# Patient Record
Sex: Female | Born: 1945
Health system: Southern US, Community
[De-identification: ages and names within clinical notes are randomized; demographics above are authoritative.]

## PROBLEM LIST (undated history)

## (undated) DIAGNOSIS — E079 Disorder of thyroid, unspecified: Secondary | ICD-10-CM

## (undated) DIAGNOSIS — F0390 Unspecified dementia without behavioral disturbance: Secondary | ICD-10-CM

## (undated) DIAGNOSIS — C9000 Multiple myeloma not having achieved remission: Secondary | ICD-10-CM

## (undated) DIAGNOSIS — E119 Type 2 diabetes mellitus without complications: Secondary | ICD-10-CM

## (undated) DIAGNOSIS — C8589 Other specified types of non-Hodgkin lymphoma, extranodal and solid organ sites: Secondary | ICD-10-CM

## (undated) DIAGNOSIS — R4189 Other symptoms and signs involving cognitive functions and awareness: Secondary | ICD-10-CM

## (undated) DIAGNOSIS — E039 Hypothyroidism, unspecified: Secondary | ICD-10-CM

## (undated) DIAGNOSIS — E876 Hypokalemia: Secondary | ICD-10-CM

## (undated) DIAGNOSIS — Z905 Acquired absence of kidney: Secondary | ICD-10-CM

## (undated) DIAGNOSIS — I10 Essential (primary) hypertension: Secondary | ICD-10-CM

## (undated) DIAGNOSIS — H269 Unspecified cataract: Secondary | ICD-10-CM

## (undated) DIAGNOSIS — C8339 Primary central nervous system lymphoma: Secondary | ICD-10-CM

## (undated) HISTORY — PX: CRANIOTOMY: SHX93

## (undated) HISTORY — PX: ABDOMINAL HYSTERECTOMY: SHX81

---

## 1998-07-15 ENCOUNTER — Emergency Department (HOSPITAL_COMMUNITY): Admission: EM | Admit: 1998-07-15 | Discharge: 1998-07-15 | Payer: Self-pay | Admitting: Emergency Medicine

## 2011-01-08 ENCOUNTER — Emergency Department (HOSPITAL_COMMUNITY)
Admission: EM | Admit: 2011-01-08 | Discharge: 2011-01-08 | Payer: Self-pay | Source: Home / Self Care | Admitting: Emergency Medicine

## 2011-01-08 LAB — BASIC METABOLIC PANEL
BUN: 16 mg/dL (ref 6–23)
Chloride: 102 mEq/L (ref 96–112)
Creatinine, Ser: 0.98 mg/dL (ref 0.4–1.2)

## 2011-01-08 LAB — CBC
MCH: 31.5 pg (ref 26.0–34.0)
MCV: 88.2 fL (ref 78.0–100.0)
Platelets: 165 10*3/uL (ref 150–400)
RDW: 15.8 % — ABNORMAL HIGH (ref 11.5–15.5)

## 2011-01-08 LAB — DIFFERENTIAL
Eosinophils Absolute: 0.3 10*3/uL (ref 0.0–0.7)
Eosinophils Relative: 4 % (ref 0–5)
Lymphs Abs: 0.8 10*3/uL (ref 0.7–4.0)
Monocytes Relative: 8 % (ref 3–12)

## 2011-01-19 ENCOUNTER — Emergency Department (HOSPITAL_COMMUNITY)
Admission: EM | Admit: 2011-01-19 | Discharge: 2011-01-19 | Disposition: A | Discharge status: Home / Self Care | Payer: 59 | Attending: Emergency Medicine | Admitting: Emergency Medicine

## 2011-01-19 ENCOUNTER — Emergency Department (HOSPITAL_COMMUNITY): Payer: 59

## 2011-01-19 DIAGNOSIS — D649 Anemia, unspecified: Secondary | ICD-10-CM | POA: Insufficient documentation

## 2011-01-19 DIAGNOSIS — R509 Fever, unspecified: Secondary | ICD-10-CM | POA: Insufficient documentation

## 2011-01-19 DIAGNOSIS — Z9889 Other specified postprocedural states: Secondary | ICD-10-CM | POA: Insufficient documentation

## 2011-01-19 DIAGNOSIS — R10819 Abdominal tenderness, unspecified site: Secondary | ICD-10-CM | POA: Insufficient documentation

## 2011-01-19 DIAGNOSIS — R109 Unspecified abdominal pain: Secondary | ICD-10-CM | POA: Insufficient documentation

## 2011-01-19 DIAGNOSIS — N390 Urinary tract infection, site not specified: Secondary | ICD-10-CM | POA: Insufficient documentation

## 2011-01-19 DIAGNOSIS — I10 Essential (primary) hypertension: Secondary | ICD-10-CM | POA: Insufficient documentation

## 2011-01-19 DIAGNOSIS — Z79899 Other long term (current) drug therapy: Secondary | ICD-10-CM | POA: Insufficient documentation

## 2011-01-19 LAB — URINALYSIS, ROUTINE W REFLEX MICROSCOPIC
Leukocytes, UA: NEGATIVE
Nitrite: POSITIVE — AB
Specific Gravity, Urine: 1.015 (ref 1.005–1.030)
Urine Glucose, Fasting: NEGATIVE mg/dL
pH: 7 (ref 5.0–8.0)

## 2011-01-19 LAB — BASIC METABOLIC PANEL
Chloride: 103 mEq/L (ref 96–112)
GFR calc Af Amer: >60 mL/min (ref >60)
Potassium: 3 mEq/L — ABNORMAL LOW (ref 3.5–5.1)
Sodium: 136 mEq/L (ref 135–145)

## 2011-01-19 LAB — CBC
MCV: 87.9 fL (ref 78.0–100.0)
Platelets: 186 10*3/uL (ref 150–400)
RBC: 2.89 MIL/uL — ABNORMAL LOW (ref 3.87–5.11)
WBC: 7.5 10*3/uL (ref 4.0–10.5)

## 2011-01-19 LAB — DIFFERENTIAL
Basophils Absolute: 0 10*3/uL (ref 0.0–0.1)
Basophils Relative: 0 % (ref 0–1)
Eosinophils Absolute: 0.3 10*3/uL (ref 0.0–0.7)
Lymphs Abs: 0.9 10*3/uL (ref 0.7–4.0)
Neutrophils Relative %: 73 % (ref 43–77)

## 2011-01-19 LAB — URINE MICROSCOPIC-ADD ON

## 2011-01-21 LAB — URINE CULTURE

## 2011-01-25 LAB — CULTURE, BLOOD (ROUTINE X 2)

## 2015-07-02 ENCOUNTER — Ambulatory Visit (HOSPITAL_COMMUNITY)
Admission: RE | Admit: 2015-07-02 | Discharge: 2015-07-02 | Disposition: A | Discharge status: Home / Self Care | Payer: Medicare PPO | Source: Ambulatory Visit | Attending: Ophthalmology | Admitting: Ophthalmology

## 2015-07-02 ENCOUNTER — Encounter (HOSPITAL_COMMUNITY)
Admission: RE | Disposition: A | Discharge status: Home / Self Care | Payer: Self-pay | Source: Ambulatory Visit | Attending: Ophthalmology

## 2015-07-02 DIAGNOSIS — I10 Essential (primary) hypertension: Secondary | ICD-10-CM | POA: Insufficient documentation

## 2015-07-02 DIAGNOSIS — E119 Type 2 diabetes mellitus without complications: Secondary | ICD-10-CM | POA: Diagnosis not present

## 2015-07-02 DIAGNOSIS — H2640 Unspecified secondary cataract: Secondary | ICD-10-CM | POA: Diagnosis not present

## 2015-07-02 DIAGNOSIS — H52203 Unspecified astigmatism, bilateral: Secondary | ICD-10-CM | POA: Diagnosis not present

## 2015-07-02 DIAGNOSIS — H5203 Hypermetropia, bilateral: Secondary | ICD-10-CM | POA: Diagnosis not present

## 2015-07-02 DIAGNOSIS — H524 Presbyopia: Secondary | ICD-10-CM | POA: Diagnosis not present

## 2015-07-02 HISTORY — PX: YAG LASER APPLICATION: SHX6189

## 2015-07-02 SURGERY — TREATMENT, USING YAG LASER
Anesthesia: LOCAL | Laterality: Right

## 2015-07-02 MED ORDER — TETRACAINE HCL 0.5 % OP SOLN
1.0000 [drp] | OPHTHALMIC | Status: AC
  Administered 2015-07-02 (×3): 1 [drp] via OPHTHALMIC

## 2015-07-02 MED ORDER — TROPICAMIDE 1 % OP SOLN
OPHTHALMIC | Status: AC
  Filled 2015-07-02: qty 3

## 2015-07-02 MED ORDER — TROPICAMIDE 1 % OP SOLN
1.0000 [drp] | OPHTHALMIC | Status: AC
  Administered 2015-07-02 (×2): 1 [drp] via OPHTHALMIC

## 2015-07-02 MED ORDER — TETRACAINE HCL 0.5 % OP SOLN
OPHTHALMIC | Status: AC
  Filled 2015-07-02: qty 2

## 2015-07-02 MED ORDER — TROPICAMIDE 1 % OP SOLN: 1.0000 [drp] | OPHTHALMIC | Status: AC

## 2015-07-02 NOTE — Discharge Instructions (Signed)
Amanda Wu  07/02/2015     Instructions    Activity: No Restrictions.   Diet: Resume Diet you were on at home.   Pain Medication: Tylenol if Needed.   CONTACT YOUR DOCTOR IF YOU HAVE PAIN, REDNESS IN YOUR EYE, OR DECREASED VISION.   Follow-up:in A FEW  weeks with [name], MD.   Dr. Gershon Crane: [phone]  Dr. Iona Hansen: [phone]  Dr. Geoffry Paradise: [phone]   If you find that you cannot contact your physician, but feel that your signs and   Symptoms warrant a physician's attention, call the Emergency Room at   [phone] [phone].   Othern/a FOLLOW UP 07/24/2015 @ 11:15 AM

## 2015-07-02 NOTE — H&P (Signed)
I have reviewed the pre printed H&P, the patient was re-examined, and I have identified no significant interval changes in the patient's medical condition.  There is no change in the plan of care since the history and physical of record. 

## 2015-07-02 NOTE — Brief Op Note (Signed)
  Amanda Wu 07/02/2015  [name], MD  Pre-op Diagnosis:  secondary cataract right eye  Post-op Diagnosis:   same  Yag laser self-test completed: Yes.    Indications:  See scanned office H&P for specific indications  Procedure: YAG posterior capsulotomy OD   Eye protection worn by staff:  Yes.   Laser In Use sign on door:  Yes.    Laser:   {LUMENIS YAG/SLT LASER selecta DUET  Power Setting:  1.7 mJ/burst Anatomical site treated:  Posterior capsule OD Number of applications:  51 Total energy delivered: 81.7 mJ Results:  Open visual axis  Patient was instructed to go to the office, as previously scheduled, for intraocular pressure:  No.  Patient verbalizes understanding of discharge instructions:  Yes.    Notes:    Pt tolerated procedure well.

## 2015-07-03 ENCOUNTER — Encounter (HOSPITAL_COMMUNITY): Payer: Self-pay | Admitting: Ophthalmology

## 2015-08-26 ENCOUNTER — Emergency Department (HOSPITAL_COMMUNITY)
Admission: EM | Admit: 2015-08-26 | Discharge: 2015-08-26 | Disposition: A | Discharge status: Home / Self Care | Payer: Medicare PPO | Attending: Emergency Medicine | Admitting: Emergency Medicine

## 2015-08-26 ENCOUNTER — Encounter (HOSPITAL_COMMUNITY): Payer: Self-pay | Admitting: Emergency Medicine

## 2015-08-26 ENCOUNTER — Emergency Department (HOSPITAL_COMMUNITY): Payer: Medicare PPO

## 2015-08-26 DIAGNOSIS — Z23 Encounter for immunization: Secondary | ICD-10-CM | POA: Diagnosis not present

## 2015-08-26 DIAGNOSIS — Z8579 Personal history of other malignant neoplasms of lymphoid, hematopoietic and related tissues: Secondary | ICD-10-CM | POA: Insufficient documentation

## 2015-08-26 DIAGNOSIS — E119 Type 2 diabetes mellitus without complications: Secondary | ICD-10-CM | POA: Diagnosis not present

## 2015-08-26 DIAGNOSIS — E039 Hypothyroidism, unspecified: Secondary | ICD-10-CM | POA: Insufficient documentation

## 2015-08-26 DIAGNOSIS — Z79899 Other long term (current) drug therapy: Secondary | ICD-10-CM | POA: Insufficient documentation

## 2015-08-26 DIAGNOSIS — E876 Hypokalemia: Secondary | ICD-10-CM | POA: Insufficient documentation

## 2015-08-26 DIAGNOSIS — I10 Essential (primary) hypertension: Secondary | ICD-10-CM | POA: Diagnosis not present

## 2015-08-26 DIAGNOSIS — S0181XA Laceration without foreign body of other part of head, initial encounter: Secondary | ICD-10-CM | POA: Insufficient documentation

## 2015-08-26 DIAGNOSIS — Y998 Other external cause status: Secondary | ICD-10-CM | POA: Insufficient documentation

## 2015-08-26 DIAGNOSIS — Z8669 Personal history of other diseases of the nervous system and sense organs: Secondary | ICD-10-CM | POA: Insufficient documentation

## 2015-08-26 DIAGNOSIS — Y9389 Activity, other specified: Secondary | ICD-10-CM | POA: Insufficient documentation

## 2015-08-26 DIAGNOSIS — R55 Syncope and collapse: Secondary | ICD-10-CM | POA: Diagnosis not present

## 2015-08-26 DIAGNOSIS — Y9289 Other specified places as the place of occurrence of the external cause: Secondary | ICD-10-CM | POA: Insufficient documentation

## 2015-08-26 DIAGNOSIS — W1839XA Other fall on same level, initial encounter: Secondary | ICD-10-CM | POA: Insufficient documentation

## 2015-08-26 HISTORY — DX: Essential (primary) hypertension: I10

## 2015-08-26 HISTORY — DX: Other symptoms and signs involving cognitive functions and awareness: R41.89

## 2015-08-26 HISTORY — DX: Multiple myeloma not having achieved remission: C90.00

## 2015-08-26 HISTORY — DX: Hypokalemia: E87.6

## 2015-08-26 HISTORY — DX: Unspecified cataract: H26.9

## 2015-08-26 HISTORY — DX: Hypothyroidism, unspecified: E03.9

## 2015-08-26 HISTORY — DX: Other specified types of non-hodgkin lymphoma, extranodal and solid organ sites: C85.89

## 2015-08-26 HISTORY — DX: Acquired absence of kidney: Z90.5

## 2015-08-26 HISTORY — DX: Disorder of thyroid, unspecified: E07.9

## 2015-08-26 HISTORY — DX: Primary central nervous system lymphoma: C83.390

## 2015-08-26 HISTORY — DX: Type 2 diabetes mellitus without complications: E11.9

## 2015-08-26 LAB — URINALYSIS, ROUTINE W REFLEX MICROSCOPIC
BILIRUBIN URINE: NEGATIVE
GLUCOSE, UA: NEGATIVE mg/dL
HGB URINE DIPSTICK: NEGATIVE
Ketones, ur: NEGATIVE mg/dL
Leukocytes, UA: NEGATIVE
Nitrite: NEGATIVE
PROTEIN: NEGATIVE mg/dL
Urobilinogen, UA: 0.2 mg/dL (ref 0.0–1.0)
pH: 6 (ref 5.0–8.0)

## 2015-08-26 LAB — CBC WITH DIFFERENTIAL/PLATELET
BASOS ABS: 0 10*3/uL (ref 0.0–0.1)
Basophils Relative: 1 % (ref 0–1)
Eosinophils Absolute: 0 10*3/uL (ref 0.0–0.7)
Eosinophils Relative: 1 % (ref 0–5)
HEMATOCRIT: 37.4 % (ref 36.0–46.0)
HEMOGLOBIN: 12.6 g/dL (ref 12.0–15.0)
LYMPHS ABS: 0.7 10*3/uL (ref 0.7–4.0)
LYMPHS PCT: 11 % — AB (ref 12–46)
MCH: 30 pg (ref 26.0–34.0)
MCHC: 33.7 g/dL (ref 30.0–36.0)
MCV: 89 fL (ref 78.0–100.0)
Monocytes Absolute: 0.4 10*3/uL (ref 0.1–1.0)
Monocytes Relative: 7 % (ref 3–12)
NEUTROS ABS: 5.1 10*3/uL (ref 1.7–7.7)
NEUTROS PCT: 81 % — AB (ref 43–77)
PLATELETS: 119 10*3/uL — AB (ref 150–400)
RBC: 4.2 MIL/uL (ref 3.87–5.11)
RDW: 17 % — ABNORMAL HIGH (ref 11.5–15.5)
WBC: 6.2 10*3/uL (ref 4.0–10.5)

## 2015-08-26 LAB — COMPREHENSIVE METABOLIC PANEL
ALK PHOS: 43 U/L (ref 38–126)
ALT: 24 U/L (ref 14–54)
AST: 34 U/L (ref 15–41)
Albumin: 3.7 g/dL (ref 3.5–5.0)
Anion gap: 9 (ref 5–15)
BUN: 13 mg/dL (ref 6–20)
CALCIUM: 9.1 mg/dL (ref 8.9–10.3)
CHLORIDE: 110 mmol/L (ref 101–111)
CO2: 26 mmol/L (ref 22–32)
CREATININE: 0.98 mg/dL (ref 0.44–1.00)
GFR, EST NON AFRICAN AMERICAN: 57 mL/min — AB (ref >60)
Glucose, Bld: 107 mg/dL — ABNORMAL HIGH (ref 65–99)
Potassium: 3.2 mmol/L — ABNORMAL LOW (ref 3.5–5.1)
Sodium: 145 mmol/L (ref 135–145)
Total Bilirubin: 0.5 mg/dL (ref 0.3–1.2)
Total Protein: 8.3 g/dL — ABNORMAL HIGH (ref 6.5–8.1)

## 2015-08-26 LAB — TROPONIN I
Troponin I: 0.04 ng/mL — ABNORMAL HIGH (ref <0.031)
Troponin I: 0.04 ng/mL — ABNORMAL HIGH (ref <0.031)

## 2015-08-26 MED ORDER — LIDOCAINE HCL (PF) 2 % IJ SOLN
10.0000 mL | Freq: Once | INTRAMUSCULAR | Status: AC
  Administered 2015-08-26: 10 mL
  Filled 2015-08-26: qty 10

## 2015-08-26 MED ORDER — TETANUS-DIPHTH-ACELL PERTUSSIS 5-2.5-18.5 LF-MCG/0.5 IM SUSP
0.5000 mL | Freq: Once | INTRAMUSCULAR | Status: AC
  Administered 2015-08-26: 0.5 mL via INTRAMUSCULAR
  Filled 2015-08-26: qty 0.5

## 2015-08-26 MED ORDER — HYDROCODONE-ACETAMINOPHEN 5-325 MG PO TABS: 2.0000 | ORAL_TABLET | ORAL | Status: DC | PRN

## 2015-08-26 MED ORDER — ACETAMINOPHEN 500 MG PO TABS
1000.0000 mg | ORAL_TABLET | Freq: Once | ORAL | Status: AC
  Administered 2015-08-26: 1000 mg via ORAL
  Filled 2015-08-26: qty 2

## 2015-08-26 MED ORDER — BACITRACIN ZINC 500 UNIT/GM EX OINT
TOPICAL_OINTMENT | CUTANEOUS | Status: AC
  Filled 2015-08-26: qty 0.9

## 2015-08-26 NOTE — ED Notes (Addendum)
Pt reports was dressing a wound for her sister and reports went to turn and reports fell forward. Pt reports loc for a few seconds per pt sister and EMS. Pt alert and oriented. Pt has forehead laceration. Wound dressed. Minimal bleeding noted to site.cbg en route 174.

## 2015-08-26 NOTE — ED Notes (Signed)
Pt ambulatory to the bathroom with assist.  Steady gait.

## 2015-08-26 NOTE — Discharge Instructions (Signed)
Facial Laceration ° A facial laceration is a cut on the face. These injuries can be painful and cause bleeding. Lacerations usually heal quickly, but they need special care to reduce scarring. °DIAGNOSIS  °Your health care provider will take a medical history, ask for details about how the injury occurred, and examine the wound to determine how deep the cut is. °TREATMENT  °Some facial lacerations may not require closure. Others may not be able to be closed because of an increased risk of infection. The risk of infection and the chance for successful closure will depend on various factors, including the amount of time since the injury occurred. °The wound may be cleaned to help prevent infection. If closure is appropriate, pain medicines may be given if needed. Your health care provider will use stitches (sutures), wound glue (adhesive), or skin adhesive strips to repair the laceration. These tools bring the skin edges together to allow for faster healing and a better cosmetic outcome. If needed, you may also be given a tetanus shot. °HOME CARE INSTRUCTIONS °· Only take over-the-counter or prescription medicines as directed by your health care provider. °· Follow your health care provider's instructions for wound care. These instructions will vary depending on the technique used for closing the wound. °For Sutures: °· Keep the wound clean and dry.   °· If you were given a bandage (dressing), you should change it at least once a day. Also change the dressing if it becomes wet or dirty, or as directed by your health care provider.   °· Wash the wound with soap and water 2 times a day. Rinse the wound off with water to remove all soap. Pat the wound dry with a clean towel.   °· After cleaning, apply a thin layer of the antibiotic ointment recommended by your health care provider. This will help prevent infection and keep the dressing from sticking.   °· You may shower as usual after the first 24 hours. Do not soak the  wound in water until the sutures are removed.   °· Get your sutures removed as directed by your health care provider. With facial lacerations, sutures should usually be taken out after 4-5 days to avoid stitch marks.   °· Wait a few days after your sutures are removed before applying any makeup. °For Skin Adhesive Strips: °· Keep the wound clean and dry.   °· Do not get the skin adhesive strips wet. You may bathe carefully, using caution to keep the wound dry.   °· If the wound gets wet, pat it dry with a clean towel.   °· Skin adhesive strips will fall off on their own. You may trim the strips as the wound heals. Do not remove skin adhesive strips that are still stuck to the wound. They will fall off in time.   °For Wound Adhesive: °· You may briefly wet your wound in the shower or bath. Do not soak or scrub the wound. Do not swim. Avoid periods of heavy sweating until the skin adhesive has fallen off on its own. After showering or bathing, gently pat the wound dry with a clean towel.   °· Do not apply liquid medicine, cream medicine, ointment medicine, or makeup to your wound while the skin adhesive is in place. This may loosen the film before your wound is healed.   °· If a dressing is placed over the wound, be careful not to apply tape directly over the skin adhesive. This may cause the adhesive to be pulled off before the wound is healed.   °· Avoid   prolonged exposure to sunlight or tanning lamps while the skin adhesive is in place.  The skin adhesive will usually remain in place for 5-10 days, then naturally fall off the skin. Do not pick at the adhesive film.  After Healing: Once the wound has healed, cover the wound with sunscreen during the day for 1 full year. This can help minimize scarring. Exposure to ultraviolet light in the first year will darken the scar. It can take 1-2 years for the scar to lose its redness and to heal completely.  SEEK IMMEDIATE MEDICAL CARE IF:  You have redness, pain, or  swelling around the wound.   You see ayellowish-white fluid (pus) coming from the wound.   You have chills or a fever.  MAKE SURE YOU:  Understand these instructions.  Will watch your condition.  Will get help right away if you are not doing well or get worse. Document Released: 01/08/2005 Document Revised: 09/21/2013 Document Reviewed: 07/14/2013 [facility] Patient Information 2015 [place], [place]. This information is not intended to replace advice given to you by your health care provider. Make sure you discuss any questions you have with your health care provider. Vasovagal Syncope, Adult Syncope, commonly known as fainting, is a temporary loss of consciousness. It occurs when the blood flow to the brain is reduced. Vasovagal syncope (also called neurocardiogenic syncope) is a fainting spell in which the blood flow to the brain is reduced because of a sudden drop in heart rate and blood pressure. Vasovagal syncope occurs when the brain and the cardiovascular system (blood vessels) do not adequately communicate and respond to each other. This is the most common cause of fainting. It often occurs in response to fear or some other type of emotional or physical stress. The body has a reaction in which the heart starts beating too slowly or the blood vessels expand, reducing blood pressure. This type of fainting spell is generally considered harmless. However, injuries can occur if a person takes a sudden fall during a fainting spell.  CAUSES  Vasovagal syncope occurs when a person's blood pressure and heart rate decrease suddenly, usually in response to a trigger. Many things and situations can trigger an episode. Some of these include:   Pain.   Fear.   The sight of blood or medical procedures, such as blood being drawn from a vein.   Common activities, such as coughing, swallowing, stretching, or going to the bathroom.   Emotional stress.   Prolonged standing, especially in a  warm environment.   Lack of sleep or rest.   Prolonged lack of food.   Prolonged lack of fluids.   Recent illness.  The use of certain drugs that affect blood pressure, such as cocaine, alcohol, marijuana, inhalants, and opiates.  SYMPTOMS  Before the fainting episode, you may:   Feel dizzy or light headed.   Become pale.  Sense that you are going to faint.   Feel like the room is spinning.   Have tunnel vision, only seeing directly in front of you.   Feel sick to your stomach (nauseous).   See spots or slowly lose vision.   Hear ringing in your ears.   Have a headache.   Feel warm and sweaty.   Feel a sensation of pins and needles. During the fainting spell, you will generally be unconscious for no longer than a couple minutes before waking up and returning to normal. If you get up too quickly before your body can recover, you may faint  again. Some twitching or jerky movements may occur during the fainting spell.  DIAGNOSIS  Your caregiver will ask about your symptoms, take a medical history, and perform a physical exam. Various tests may be done to rule out other causes of fainting. These may include blood tests and tests to check the heart, such as electrocardiography, echocardiography, and possibly an electrophysiology study. When other causes have been ruled out, a test may be done to check the body's response to changes in position (tilt table test). TREATMENT  Most cases of vasovagal syncope do not require treatment. Your caregiver may recommend ways to avoid fainting triggers and may provide home strategies for preventing fainting. If you must be exposed to a possible trigger, you can drink additional fluids to help reduce your chances of having an episode of vasovagal syncope. If you have warning signs of an oncoming episode, you can respond by positioning yourself favorably (lying down). If your fainting spells continue, you may be given medicines to  prevent fainting. Some medicines may help make you more resistant to repeated episodes of vasovagal syncope. Special exercises or compression stockings may be recommended. In rare cases, the surgical placement of a pacemaker is considered. HOME CARE INSTRUCTIONS   Learn to identify the warning signs of vasovagal syncope.   Sit or lie down at the first warning sign of a fainting spell. If sitting, put your head down between your legs. If you lie down, swing your legs up in the air to increase blood flow to the brain.   Avoid hot tubs and saunas.  Avoid prolonged standing.  Drink enough fluids to keep your urine clear or pale yellow. Avoid caffeine.  Increase salt in your diet as directed by your caregiver.   If you have to stand for a long time, perform movements such as:   Crossing your legs.   Flexing and stretching your leg muscles.   Squatting.   Moving your legs.   Bending over.   Only take over-the-counter or prescription medicines as directed by your caregiver. Do not suddenly stop any medicines without asking your caregiver first. SEEK MEDICAL CARE IF:   Your fainting spells continue or happen more frequently in spite of treatment.   You lose consciousness for more than a couple minutes.  You have fainting spells during or after exercising or after being startled.   You have new symptoms that occur with the fainting spells, such as:   Shortness of breath.  Chest pain.   Irregular heartbeat.   You have episodes of twitching or jerky movements that last longer than a few seconds.  You have episodes of twitching or jerky movements without obvious fainting. SEEK IMMEDIATE MEDICAL CARE IF:   You have injuries or bleeding after a fainting spell.   You have episodes of twitching or jerky movements that last longer than 5 minutes.   You have more than one spell of twitching or jerky movements before returning to consciousness after fainting. MAKE  SURE YOU:   Understand these instructions.  Will watch your condition.  Will get help right away if you are not doing well or get worse. Document Released: 11/17/2012 Document Reviewed: 11/17/2012 [facility] Patient Information 2015 [place]. This information is not intended to replace advice given to you by your health care provider. Make sure you discuss any questions you have with your health care provider.

## 2015-08-26 NOTE — ED Notes (Signed)
Patient requesting something for her headache. Amanda Wu made aware. New verbal orders given.

## 2015-08-26 NOTE — ED Provider Notes (Signed)
CSN: [id number]     Arrival date & time 08/26/15  7591 History   First MD Initiated Contact with Patient 08/26/15 0930     Chief Complaint  Patient presents with  . Loss of Consciousness     (Consider location/radiation/quality/duration/timing/severity/associated sxs/prior Treatment) Patient is a 69 y.o. female presenting with syncope. The history is provided by the patient. No language interpreter was used.  Loss of Consciousness Episode history:  Single Most recent episode:  Today Timing:  Constant Progression:  Worsening Chronicity:  New Context comment:  Wound evaluation Witnessed: yes   Worsened by:  Nothing tried Ineffective treatments:  None tried Associated symptoms: no chest pain, no focal weakness and no headaches   Pt reports she passed out after watching her sisters surgical dressing being changed.   Past Medical History  Diagnosis Date  . Multiple myeloma   . Benign hypertension   . Hypokalemia   . Central nervous system lymphoma   . H/O partial nephrectomy   . Impaired cognition   . Cataract   . Diabetes mellitus without complication   . Thyroid disease   . Hypothyroidism    Past Surgical History  Procedure Laterality Date  . Yag laser application Right 6/38/4665    Procedure: YAG LASER APPLICATION;  Surgeon: [name], MD;  Location: [facility];  Service: Ophthalmology;  Laterality: Right;  . Abdominal hysterectomy     History reviewed. No pertinent family history. Social History  Substance Use Topics  . Smoking status: Never Smoker   . Smokeless tobacco: None  . Alcohol Use: No   OB History    No data available     Review of Systems  Cardiovascular: Positive for syncope. Negative for chest pain.  Skin: Positive for wound.  Neurological: Negative for focal weakness and headaches.  All other systems reviewed and are negative.     Allergies  Lotensin  Home Medications   Prior to Admission medications   Medication Sig Start Date End  Date Taking? Authorizing Provider  acetaminophen (TYLENOL) 500 MG tablet Take 1,000 mg by mouth every 6 (six) hours as needed.   Yes Historical Provider, MD  amLODipine (NORVASC) 5 MG tablet Take 7.5 mg by mouth daily.   Yes Historical Provider, MD  dexamethasone (DECADRON) 4 MG tablet Take 4 mg by mouth. Take 5 tablets every Monday when you have chemo therapy.   Yes Historical Provider, MD  diphenhydramine-acetaminophen (TYLENOL PM) 25-500 MG TABS Take 1 tablet by mouth at bedtime as needed.   Yes Historical Provider, MD  ferrous sulfate 325 (65 FE) MG tablet Take 325 mg by mouth daily with breakfast.   Yes Historical Provider, MD  lenalidomide (REVLIMID) 15 MG capsule Take 15 mg by mouth daily. For two weeks then off for three weeks. Then follow up doctor   Yes Historical Provider, MD  levothyroxine (SYNTHROID, LEVOTHROID) 25 MCG tablet Take 25 mcg by mouth daily before breakfast.   Yes Historical Provider, MD  loperamide (IMODIUM) 2 MG capsule Take 2 mg by mouth as needed for diarrhea or loose stools.   Yes Historical Provider, MD  metFORMIN (GLUCOPHAGE) 500 MG tablet Take 250 mg by mouth every other day.   Yes Historical Provider, MD  Multiple Vitamins-Minerals (CENTRUM ADULTS PO) Take 1 tablet by mouth daily.   Yes Historical Provider, MD  potassium chloride (K-DUR) 10 MEQ tablet Take 30 mEq by mouth daily.   Yes Historical Provider, MD   BP 143/86 mmHg  Pulse 76  Temp(Src)  98.1 F (36.7 C) (Oral)  Resp 14  Ht '5\' 9"'  (1.753 m)  Wt 146 lb (66.225 kg)  BMI 21.55 kg/m2  SpO2 99% Physical Exam  Constitutional: She is oriented to person, place, and time. She appears well-developed and well-nourished.  HENT:  Head: Normocephalic.  6cm laceration forehead.    Eyes: Conjunctivae and EOM are normal. Pupils are equal, round, and reactive to light.  Neck: Normal range of motion.  Cardiovascular: Normal rate and normal heart sounds.   Pulmonary/Chest: Effort normal and breath sounds normal.   Abdominal: Soft.  Musculoskeletal: Normal range of motion.  Neurological: She is alert and oriented to person, place, and time. She has normal reflexes.  Skin: Skin is warm.  Psychiatric: She has a normal mood and affect.  Nursing note and vitals reviewed.   ED Course  LACERATION REPAIR Date/Time: 08/26/2015 4:37 PM Performed by: Fransico Meadow Authorized by: Fransico Meadow Consent: Verbal consent not obtained. Risks and benefits: risks, benefits and alternatives were discussed Consent given by: patient Patient understanding: patient states understanding of the procedure being performed Required items: required blood products, implants, devices, and special equipment available Patient identity confirmed: verbally with patient Body area: head/neck Location details: forehead Laceration length: 6 cm Foreign bodies: no foreign bodies Tendon involvement: none Nerve involvement: none Anesthesia: local infiltration Local anesthetic: lidocaine 2% without epinephrine Patient sedated: no Preparation: Patient was prepped and draped in the usual sterile fashion. Irrigation solution: saline Amount of cleaning: standard Debridement: none Degree of undermining: none Skin closure: 6-0 Prolene Number of sutures: 10 Technique: simple Approximation: loose Approximation difficulty: simple Dressing: 4x4 sterile gauze Patient tolerance: Patient tolerated the procedure well with no immediate complications   (including critical care time) Labs Review Labs Reviewed - No data to display  Imaging Review Ct Head Wo Contrast  08/26/2015   CLINICAL DATA:  [place] forward, hitting forehead with laceration. Brief loss of consciousness. History of CNS lymphoma status post surgery and radiation therapy.  EXAM: CT HEAD WITHOUT CONTRAST  TECHNIQUE: Contiguous axial images were obtained from the base of the skull through the vertex without intravenous contrast.  COMPARISON:  None.  FINDINGS: Sequelae of  prior bifrontal craniotomy are identified with underlying encephalomalacia in both anterior frontal lobes. There is no evidence of acute cortical infarct, intracranial hemorrhage, sizable mass, midline shift, or extra-axial fluid collection. There is slight ex vacuo enlargement of the frontal horns of the lateral ventricles. There is a 7 mm densely calcified focus adjacent to the left anterior clinoid process which could represent a tiny meningioma.  Small left frontal scalp hematoma and laceration is noted. Prior bilateral cataract extraction. Visualized paranasal sinuses and mastoid air cells are clear. An 11 mm well-circumscribed lucency is present in the diploic space of the right parietal bone. No skull fracture is seen. Calcified atherosclerosis is noted at the skullbase.  IMPRESSION: 1. No acute intracranial abnormality identified. Left frontal scalp hematoma/laceration. 2. Prior craniotomy with bilateral frontal lobe encephalomalacia. 3. 7 mm calcific focus adjacent to the left anterior clinoid process. The tiny meningioma not excluded. 4. Indeterminate 11 mm right parietal skull lesion.   Electronically Signed   By: Logan Bores M.D.   On: 08/26/2015 10:24   I have personally reviewed and evaluated these images and lab results as part of my medical decision-making.   EKG Interpretation   Date/Time:  Sunday August 26 2015 11:49:09 EDT Ventricular Rate:  75 PR Interval:  222 QRS Duration: 84 QT Interval:  436 QTC Calculation: 487 R Axis:   44 Text Interpretation:  Sinus rhythm Prolonged PR interval Borderline  prolonged QT interval Non-specific ST-t changes Confirmed by Wilson Singer  MD,  STEPHEN ([id number]) on 08/26/2015 12:35:45 PM      MDM   Pt has troponin of 0.04.   I repeated 3 hour troponin and result is 0.04. Pt has had syncope in the past. Pt does not want to be admitted.   Pt agrees to follow up with her MD for recheck.   Suture removal in 7 days   Final diagnoses:  Laceration of  forehead, initial encounter  Syncope, unspecified syncope type        Fransico Meadow, PA-C 08/26/15 1639  Virgel Manifold, MD 09/06/15 [phone]

## 2015-08-30 ENCOUNTER — Emergency Department (HOSPITAL_COMMUNITY)
Admission: EM | Admit: 2015-08-30 | Discharge: 2015-08-30 | Disposition: A | Discharge status: Home / Self Care | Payer: Medicare PPO | Attending: Emergency Medicine | Admitting: Emergency Medicine

## 2015-08-30 ENCOUNTER — Encounter (HOSPITAL_COMMUNITY): Payer: Self-pay

## 2015-08-30 ENCOUNTER — Emergency Department (HOSPITAL_COMMUNITY): Payer: Medicare PPO

## 2015-08-30 DIAGNOSIS — Z8579 Personal history of other malignant neoplasms of lymphoid, hematopoietic and related tissues: Secondary | ICD-10-CM | POA: Diagnosis not present

## 2015-08-30 DIAGNOSIS — E039 Hypothyroidism, unspecified: Secondary | ICD-10-CM | POA: Diagnosis not present

## 2015-08-30 DIAGNOSIS — Z8669 Personal history of other diseases of the nervous system and sense organs: Secondary | ICD-10-CM | POA: Diagnosis not present

## 2015-08-30 DIAGNOSIS — E119 Type 2 diabetes mellitus without complications: Secondary | ICD-10-CM | POA: Insufficient documentation

## 2015-08-30 DIAGNOSIS — Z8572 Personal history of non-Hodgkin lymphomas: Secondary | ICD-10-CM | POA: Insufficient documentation

## 2015-08-30 DIAGNOSIS — Z79899 Other long term (current) drug therapy: Secondary | ICD-10-CM | POA: Insufficient documentation

## 2015-08-30 DIAGNOSIS — E876 Hypokalemia: Secondary | ICD-10-CM | POA: Diagnosis not present

## 2015-08-30 DIAGNOSIS — M25512 Pain in left shoulder: Secondary | ICD-10-CM | POA: Diagnosis not present

## 2015-08-30 DIAGNOSIS — I10 Essential (primary) hypertension: Secondary | ICD-10-CM | POA: Insufficient documentation

## 2015-08-30 MED ORDER — SODIUM CHLORIDE 0.9 % IJ SOLN
INTRAMUSCULAR | Status: AC
  Filled 2015-08-30: qty 36

## 2015-08-30 MED ORDER — IBUPROFEN 800 MG PO TABS: 800.0000 mg | ORAL_TABLET | Freq: Three times a day (TID) | ORAL | Status: DC

## 2015-08-30 NOTE — ED Provider Notes (Addendum)
CSN: [id number]     Arrival date & time 08/30/15  1201 History   First MD Initiated Contact with Patient 08/30/15 1305     Chief Complaint  Patient presents with  . Shoulder Pain     (Consider location/radiation/quality/duration/timing/severity/associated sxs/prior Treatment) HPI Comments: The patient is a 69 year old female, she presents with left shoulder pain after having a fall 5 days ago when she landed on her head in her left shoulder. Initially she had minimal pain but this has become more intense in the left rhomboid and trapezius area. She has pain with moving the left arm at the shoulder but no pain in the humerus, elbow, forearm or hand. She denies numbness, weakness, has no difficulty ambulating, no headache, no blurred vision, no chest pain or shortness of breath and no pain with deep breathing. She has not been taking any medications for this.  Patient is a 69 y.o. female presenting with shoulder pain. The history is provided by the patient.  Shoulder Pain   Past Medical History  Diagnosis Date  . Multiple myeloma   . Benign hypertension   . Hypokalemia   . Central nervous system lymphoma   . H/O partial nephrectomy   . Impaired cognition   . Cataract   . Diabetes mellitus without complication   . Thyroid disease   . Hypothyroidism    Past Surgical History  Procedure Laterality Date  . Yag laser application Right 9/52/8413    Procedure: YAG LASER APPLICATION;  Surgeon: [name], MD;  Location: [facility];  Service: Ophthalmology;  Laterality: Right;  . Abdominal hysterectomy     No family history on file. Social History  Substance Use Topics  . Smoking status: Never Smoker   . Smokeless tobacco: None  . Alcohol Use: No   OB History    No data available     Review of Systems  All other systems reviewed and are negative.     Allergies  Lotensin  Home Medications   Prior to Admission medications   Medication Sig Start Date End Date Taking?  Authorizing Provider  acetaminophen (TYLENOL) 500 MG tablet Take 1,000 mg by mouth every 6 (six) hours as needed for mild pain.    Yes Historical Provider, MD  amLODipine (NORVASC) 5 MG tablet Take 5 mg by mouth daily.    Yes Historical Provider, MD  dexamethasone (DECADRON) 4 MG tablet Take 4 mg by mouth. Take 5 tablets every Monday when you have chemo therapy.   Yes Historical Provider, MD  diphenhydramine-acetaminophen (TYLENOL PM) 25-500 MG TABS Take 1 tablet by mouth at bedtime as needed (sleep/pain).    Yes Historical Provider, MD  ferrous sulfate 325 (65 FE) MG tablet Take 325 mg by mouth daily with breakfast.   Yes Historical Provider, MD  lenalidomide (REVLIMID) 15 MG capsule Take 15 mg by mouth daily. For two weeks then off for three weeks. Then follow up doctor   Yes Historical Provider, MD  levothyroxine (SYNTHROID, LEVOTHROID) 25 MCG tablet Take 25 mcg by mouth daily before breakfast.   Yes Historical Provider, MD  loperamide (IMODIUM) 2 MG capsule Take 2 mg by mouth as needed for diarrhea or loose stools.   Yes Historical Provider, MD  losartan (COZAAR) 25 MG tablet Take 25 mg by mouth daily.   Yes Historical Provider, MD  metFORMIN (GLUCOPHAGE) 500 MG tablet Take 250 mg by mouth every other day.   Yes Historical Provider, MD  Multiple Vitamins-Minerals (CENTRUM ADULTS PO) Take 1  tablet by mouth daily.   Yes Historical Provider, MD  potassium chloride (K-DUR) 10 MEQ tablet Take 30 mEq by mouth daily.   Yes Historical Provider, MD  HYDROcodone-acetaminophen (NORCO/VICODIN) 5-325 MG per tablet Take 2 tablets by mouth every 4 (four) hours as needed. Patient not taking: Reported on 08/30/2015 08/26/15   Fransico Meadow, PA-C  ibuprofen (ADVIL,MOTRIN) 800 MG tablet Take 1 tablet (800 mg total) by mouth 3 (three) times daily. 08/30/15   Noemi Chapel, MD   BP 142/80 mmHg  Pulse 85  Temp(Src) 98.3 F (36.8 C) (Oral)  Resp 16  Ht 5' 9" (1.753 m)  Wt 146 lb (66.225 kg)  BMI 21.55 kg/m2  SpO2  100% Physical Exam  Constitutional: She appears well-developed and well-nourished. No distress.  HENT:  Head: Normocephalic.  Mouth/Throat: Oropharynx is clear and moist. No oropharyngeal exudate.  Well-healing scar to the left scalp, sutures in place, no drainage discharge or surrounding redness or tenderness  Eyes: Conjunctivae and EOM are normal. Pupils are equal, round, and reactive to light. Right eye exhibits no discharge. Left eye exhibits no discharge. No scleral icterus.  Neck: Normal range of motion. Neck supple. No JVD present. No thyromegaly present.  Cardiovascular: Normal rate, regular rhythm and intact distal pulses.   Musculoskeletal: Normal range of motion. She exhibits tenderness ( Tender to palpation in the left rhomboid, minimally over the left shoulder and left trapezius, normal range of motion of the left shoulder and left elbow left wrist and left hand). She exhibits no edema.  Normal strength of the rotator cuff on the left, normal bicep triceps and forearm strength, normal grips, normal sensation  Lymphadenopathy:    She has no cervical adenopathy.  Neurological: She is alert. Coordination normal.  Normal sensation of the left upper extremity  Skin: Skin is warm and dry. No rash noted. No erythema.  Psychiatric: She has a normal mood and affect. Her behavior is normal.  Nursing note and vitals reviewed.   ED Course  Procedures (including critical care time) Labs Review Labs Reviewed - No data to display  Imaging Review Dg Shoulder Left  08/30/2015   CLINICAL DATA:  Left shoulder pain status post fall.  EXAM: LEFT SHOULDER - 2+ VIEW  COMPARISON:  None.  FINDINGS: There is no evidence of dislocation. There is a longitudinal linear lucency along the glenoid which may represent overlapping shadows or a subtle nondisplaced fracture. There is no evidence of arthropathy or other focal bone abnormality. Soft tissues are unremarkable.  IMPRESSION: Linear lucency along the  glenoid which may represent overlapping shadows or a subtle nondisplaced fracture. If point tenderness, CT of the shoulder may be considered for better evaluation.   Electronically Signed   By: Fidela Salisbury M.D.   On: 08/30/2015 14:09   I have personally reviewed and evaluated these images and lab results as part of my medical decision-making.    MDM   Final diagnoses:  Shoulder pain, acute, left    Image left shoulder, remove sutures, patient otherwise unremarkable, NSAIDs   Sutures removed by nursing staff, examined post removal, well-appearing healing scar, patient informed of results, stable for discharge on ibuprofen  Very supple joints, doubt glenoid fracture given clinical exam, patient informed of the x-ray report, will follow-up with family doctor  The patient is concerned that the reason that she had fallen several days ago was because of a new medication, this is an angiotensin receptor blocker. I do not think that that is the  likely cause. I've encouraged her to keep taking it, she has had no more symptoms and has taken that medication since the fall.  Noemi Chapel, MD 08/30/15 [place], MD 08/30/15 [phone]

## 2015-08-30 NOTE — Discharge Instructions (Signed)
The xrays are very normal, there is a subtle area that suggests a possible tiny fracture - but it is not located where your pain is.  Motrin 3 times a day  See your doctor within 2 weeks for recheck.  Please obtain all of your results from medical records or have your doctors office obtain the results - share them with your doctor - you should be seen at your doctors office in the next 2 days. Call today to arrange your follow up. Take the medications as prescribed. Please review all of the medicines and only take them if you do not have an allergy to them. Please be aware that if you are taking birth control pills, taking other prescriptions, ESPECIALLY ANTIBIOTICS may make the birth control ineffective - if this is the case, either do not engage in sexual activity or use alternative methods of birth control such as condoms until you have finished the medicine and your family doctor says it is OK to restart them. If you are on a blood thinner such as COUMADIN, be aware that any other medicine that you take may cause the coumadin to either work too much, or not enough - you should have your coumadin level rechecked in next 7 days if this is the case.  ?  It is also a possibility that you have an allergic reaction to any of the medicines that you have been prescribed - Everybody reacts differently to medications and while MOST people have no trouble with most medicines, you may have a reaction such as nausea, vomiting, rash, swelling, shortness of breath. If this is the case, please stop taking the medicine immediately and contact your physician.  ?  You should return to the ER if you develop severe or worsening symptoms.

## 2015-08-30 NOTE — ED Notes (Signed)
Sutures removed.  Wound cleaned.  Well approximated.  No drainage.

## 2015-08-30 NOTE — ED Notes (Signed)
Pt reports she fell on Sunday, was seen here for laceration on head. Reports L.shoulder pain. No pain on sunday

## 2017-07-09 ENCOUNTER — Emergency Department (HOSPITAL_COMMUNITY): Payer: Medicare PPO

## 2017-07-09 ENCOUNTER — Encounter (HOSPITAL_COMMUNITY): Payer: Self-pay | Admitting: Emergency Medicine

## 2017-07-09 ENCOUNTER — Observation Stay (HOSPITAL_COMMUNITY)
Admission: EM | Admit: 2017-07-09 | Discharge: 2017-07-13 | Disposition: A | Discharge status: Home / Self Care | Payer: Medicare PPO | Attending: Internal Medicine | Admitting: Internal Medicine

## 2017-07-09 DIAGNOSIS — B9620 Unspecified Escherichia coli [E. coli] as the cause of diseases classified elsewhere: Secondary | ICD-10-CM | POA: Diagnosis present

## 2017-07-09 DIAGNOSIS — D696 Thrombocytopenia, unspecified: Secondary | ICD-10-CM

## 2017-07-09 DIAGNOSIS — M549 Dorsalgia, unspecified: Secondary | ICD-10-CM | POA: Diagnosis not present

## 2017-07-09 DIAGNOSIS — E039 Hypothyroidism, unspecified: Secondary | ICD-10-CM | POA: Diagnosis not present

## 2017-07-09 DIAGNOSIS — R55 Syncope and collapse: Principal | ICD-10-CM

## 2017-07-09 DIAGNOSIS — R778 Other specified abnormalities of plasma proteins: Secondary | ICD-10-CM | POA: Diagnosis present

## 2017-07-09 DIAGNOSIS — M7989 Other specified soft tissue disorders: Secondary | ICD-10-CM

## 2017-07-09 DIAGNOSIS — R0602 Shortness of breath: Secondary | ICD-10-CM

## 2017-07-09 DIAGNOSIS — I10 Essential (primary) hypertension: Secondary | ICD-10-CM

## 2017-07-09 DIAGNOSIS — N390 Urinary tract infection, site not specified: Secondary | ICD-10-CM | POA: Diagnosis present

## 2017-07-09 DIAGNOSIS — E119 Type 2 diabetes mellitus without complications: Secondary | ICD-10-CM | POA: Diagnosis not present

## 2017-07-09 DIAGNOSIS — S32000A Wedge compression fracture of unspecified lumbar vertebra, initial encounter for closed fracture: Secondary | ICD-10-CM | POA: Diagnosis present

## 2017-07-09 DIAGNOSIS — Z7984 Long term (current) use of oral hypoglycemic drugs: Secondary | ICD-10-CM | POA: Insufficient documentation

## 2017-07-09 DIAGNOSIS — C9000 Multiple myeloma not having achieved remission: Secondary | ICD-10-CM | POA: Diagnosis present

## 2017-07-09 DIAGNOSIS — Z79899 Other long term (current) drug therapy: Secondary | ICD-10-CM | POA: Insufficient documentation

## 2017-07-09 DIAGNOSIS — R7989 Other specified abnormal findings of blood chemistry: Secondary | ICD-10-CM

## 2017-07-09 LAB — CBC WITH DIFFERENTIAL/PLATELET
Basophils Absolute: 0 10*3/uL (ref 0.0–0.1)
Basophils Relative: 0 %
EOS ABS: 0.1 10*3/uL (ref 0.0–0.7)
EOS PCT: 1 %
HCT: 29.5 % — ABNORMAL LOW (ref 36.0–46.0)
Hemoglobin: 9.7 g/dL — ABNORMAL LOW (ref 12.0–15.0)
Lymphocytes Relative: 7 %
Lymphs Abs: 0.6 10*3/uL — ABNORMAL LOW (ref 0.7–4.0)
MCH: 29.8 pg (ref 26.0–34.0)
MCHC: 32.9 g/dL (ref 30.0–36.0)
MCV: 90.8 fL (ref 78.0–100.0)
Monocytes Absolute: 0.5 10*3/uL (ref 0.1–1.0)
Monocytes Relative: 7 %
Neutro Abs: 6.3 10*3/uL (ref 1.7–7.7)
Neutrophils Relative %: 85 %
PLATELETS: 86 10*3/uL — AB (ref 150–400)
RBC: 3.25 MIL/uL — AB (ref 3.87–5.11)
RDW: 17 % — ABNORMAL HIGH (ref 11.5–15.5)
WBC: 7.4 10*3/uL (ref 4.0–10.5)

## 2017-07-09 LAB — BASIC METABOLIC PANEL
Anion gap: 7 (ref 5–15)
BUN: 21 mg/dL — AB (ref 6–20)
CO2: 24 mmol/L (ref 22–32)
Calcium: 9.4 mg/dL (ref 8.9–10.3)
Chloride: 106 mmol/L (ref 101–111)
Creatinine, Ser: 1.33 mg/dL — ABNORMAL HIGH (ref 0.44–1.00)
GFR, EST AFRICAN AMERICAN: 45 mL/min — AB (ref >60)
GFR, EST NON AFRICAN AMERICAN: 39 mL/min — AB (ref >60)
Glucose, Bld: 139 mg/dL — ABNORMAL HIGH (ref 65–99)
POTASSIUM: 4.1 mmol/L (ref 3.5–5.1)
SODIUM: 137 mmol/L (ref 135–145)

## 2017-07-09 LAB — HEPATIC FUNCTION PANEL
ALBUMIN: 3.5 g/dL (ref 3.5–5.0)
ALT: 32 U/L (ref 14–54)
AST: 43 U/L — AB (ref 15–41)
Alkaline Phosphatase: 39 U/L (ref 38–126)
Bilirubin, Direct: 0.1 mg/dL (ref 0.1–0.5)
Indirect Bilirubin: 0.9 mg/dL (ref 0.3–0.9)
TOTAL PROTEIN: 8.3 g/dL — AB (ref 6.5–8.1)
Total Bilirubin: 1 mg/dL (ref 0.3–1.2)

## 2017-07-09 LAB — GLUCOSE, CAPILLARY: GLUCOSE-CAPILLARY: 91 mg/dL (ref 65–99)

## 2017-07-09 LAB — TROPONIN I
TROPONIN I: 0.08 ng/mL — AB (ref <0.03)
TROPONIN I: 0.09 ng/mL — AB (ref <0.03)
Troponin I: 0.08 ng/mL (ref <0.03)

## 2017-07-09 LAB — MAGNESIUM: MAGNESIUM: 2 mg/dL (ref 1.7–2.4)

## 2017-07-09 MED ORDER — SODIUM CHLORIDE 0.9% FLUSH
3.0000 mL | Freq: Two times a day (BID) | INTRAVENOUS | Status: DC
  Administered 2017-07-09 – 2017-07-13 (×6): 3 mL via INTRAVENOUS

## 2017-07-09 MED ORDER — HYDROMORPHONE HCL 1 MG/ML IJ SOLN
0.5000 mg | Freq: Once | INTRAMUSCULAR | Status: AC
  Administered 2017-07-09: 0.5 mg via INTRAVENOUS
  Filled 2017-07-09: qty 1

## 2017-07-09 MED ORDER — ONDANSETRON HCL 4 MG/2ML IJ SOLN
4.0000 mg | Freq: Once | INTRAMUSCULAR | Status: AC
  Administered 2017-07-09: 4 mg via INTRAVENOUS
  Filled 2017-07-09: qty 2

## 2017-07-09 MED ORDER — DIPHENHYDRAMINE-APAP (SLEEP) 25-500 MG PO TABS: 1.0000 | ORAL_TABLET | Freq: Every evening | ORAL | Status: DC | PRN

## 2017-07-09 MED ORDER — POTASSIUM CHLORIDE CRYS ER 10 MEQ PO TBCR
30.0000 meq | EXTENDED_RELEASE_TABLET | Freq: Every day | ORAL | Status: DC
  Administered 2017-07-10 – 2017-07-11 (×2): 30 meq via ORAL
  Filled 2017-07-09 (×4): qty 3

## 2017-07-09 MED ORDER — FERROUS SULFATE 325 (65 FE) MG PO TABS
325.0000 mg | ORAL_TABLET | Freq: Every day | ORAL | Status: DC
  Administered 2017-07-10 – 2017-07-13 (×4): 325 mg via ORAL
  Filled 2017-07-09 (×4): qty 1

## 2017-07-09 MED ORDER — METFORMIN HCL 500 MG PO TABS
250.0000 mg | ORAL_TABLET | ORAL | Status: DC
  Administered 2017-07-10: 250 mg via ORAL
  Filled 2017-07-09: qty 1

## 2017-07-09 MED ORDER — LEVOTHYROXINE SODIUM 25 MCG PO TABS
25.0000 ug | ORAL_TABLET | Freq: Every day | ORAL | Status: DC
  Administered 2017-07-10 – 2017-07-13 (×4): 25 ug via ORAL
  Filled 2017-07-09 (×4): qty 1

## 2017-07-09 MED ORDER — KETOROLAC TROMETHAMINE 30 MG/ML IJ SOLN
15.0000 mg | Freq: Once | INTRAMUSCULAR | Status: DC
  Filled 2017-07-09: qty 1

## 2017-07-09 MED ORDER — AMLODIPINE BESYLATE 5 MG PO TABS
5.0000 mg | ORAL_TABLET | Freq: Every day | ORAL | Status: DC
  Filled 2017-07-09: qty 1

## 2017-07-09 MED ORDER — ACETAMINOPHEN 500 MG PO TABS: 500.0000 mg | ORAL_TABLET | Freq: Every evening | ORAL | Status: DC | PRN

## 2017-07-09 MED ORDER — PNEUMOCOCCAL VAC POLYVALENT 25 MCG/0.5ML IJ INJ
0.5000 mL | INJECTION | INTRAMUSCULAR | Status: AC
  Administered 2017-07-10: 0.5 mL via INTRAMUSCULAR
  Filled 2017-07-09: qty 0.5

## 2017-07-09 MED ORDER — LOSARTAN POTASSIUM 50 MG PO TABS
100.0000 mg | ORAL_TABLET | Freq: Every day | ORAL | Status: DC
  Filled 2017-07-09 (×2): qty 2

## 2017-07-09 MED ORDER — DIPHENHYDRAMINE HCL 25 MG PO CAPS: 25.0000 mg | ORAL_CAPSULE | Freq: Every evening | ORAL | Status: DC | PRN

## 2017-07-09 NOTE — ED Notes (Signed)
CRITICAL VALUE ALERT  Critical Value:  Troponin 0.08  Date & Time Notied:  07/09/17 at 1450  Provider Notified: Roderic Palau  Orders Received/Actions taken: EDP made aware

## 2017-07-09 NOTE — ED Triage Notes (Signed)
Pt called ems due to syncope yesterday. cbg in [address]. Lives with 2 sisters who state pt was normal prior to syncope and mental status normal now. Pt c/o pain from upper right leg/hip to just above right hip area. A/o.

## 2017-07-09 NOTE — ED Notes (Signed)
Patient transported to X-ray 

## 2017-07-09 NOTE — H&P (Signed)
History and Physical    Amanda Wu VXY:[id number] DOB: 1946/05/20 DOA: 07/09/2017  PCP: Vedia Coffer, MD   Patient coming from: Home.  I have personally briefly reviewed patient's old medical records in [place]  Chief Complaint: Syncope yesterday.  HPI: Amanda Wu is a 71 y.o. female with medical history significant of benign hypertension, cataracts, CNS lymphoma, type 2 diabetes, history of partial nephrectomy, hypothyroidism, impaired cognition, multiple myeloma who is brought to the emergency department due to having a syncope at home yesterday.  Per patient, yesterday morning while at home she got up just after she finished it in her oatmeal for breakfast. She mentions that upon arising she developed dizziness, became nauseous, diaphoretic, having 1 episode of emesis and vomiting the contents of her breakfast, then subsequently passing out. The patient mentions that after she woke up she noticed that she was having back pain, which was new. She denies chest pain, dyspnea, palpitations, recent PND, orthopnea or pitting edema lower extremities. She denies abdominal pain, diarrhea, constipation, melena or hematochezia. She denies dysuria, frequency or hematuria. She denies polyuria, polydipsia or blurred vision.  ED Course: Initial vital signs temperature 98.34F, pulse 75, blood pressure 131/70 mmHg, respirations 24 and O2 sat 95% on room air. WBC was 7.4, hemoglobin 9.7 g/dL and platelets 86. Sodium 137, potassium 4.1, chloride 106, bicarbonate 24 mmol/L. BUN 21, creatinine 1.33, glucose 139 and magnesium 2.0 mg/dL. Total protein was mildly elevated as well as AST of 43. EKG was sinus rhythm with baseline wander in 3 and V5. Troponin level 2 has been 0.08 ng/mL.  Imaging: Lumbar sacral spine shows 50% L1 compression fracture of indeterminate acuity. CT scan head showed chronic changes similar to previous exam, but no acute intracranial pathology.  Review of Systems: As per  HPI otherwise 10 point review of systems negative.    Past Medical History:  Diagnosis Date  . Benign hypertension   . Cataract   . Central nervous system lymphoma ([place])   . Diabetes mellitus without complication ([place])   . H/O partial nephrectomy   . Hypokalemia   . Hypothyroidism   . Impaired cognition   . Multiple myeloma ([place])   . Thyroid disease     Past Surgical History:  Procedure Laterality Date  . ABDOMINAL HYSTERECTOMY    . YAG LASER APPLICATION Right 08/10/785   Procedure: YAG LASER APPLICATION;  Surgeon: [name], MD;  Location: [facility];  Service: Ophthalmology;  Laterality: Right;     reports that she has never smoked. She has never used smokeless tobacco. She reports that she does not drink alcohol or use drugs.  Allergies  Allergen Reactions  . Lotensin [Benazepril Hcl]     Facial swelling   Family History  Problem Relation Age of Onset  . Depression Mother   . Heart disease Father   . Cancer - Prostate Brother     Prior to Admission medications   Medication Sig Start Date End Date Taking? Authorizing Provider  acetaminophen (TYLENOL) 500 MG tablet Take 1,000 mg by mouth every 6 (six) hours as needed for mild pain.    Yes [provider]  amLODipine (NORVASC) 5 MG tablet Take 5 mg by mouth daily.    Yes [provider]  dexamethasone (DECADRON) 4 MG tablet Take 4 mg by mouth. Take 5 tablets every Monday when you have chemo therapy.   Yes [provider]  diphenhydramine-acetaminophen (TYLENOL PM) 25-500 MG TABS Take 1 tablet by mouth  at bedtime as needed (sleep/pain).    Yes [provider]  ferrous sulfate 325 (65 FE) MG tablet Take 325 mg by mouth daily with breakfast.   Yes [provider]  levothyroxine (SYNTHROID, LEVOTHROID) 25 MCG tablet Take 25 mcg by mouth daily before breakfast.   Yes [provider]  losartan (COZAAR) 25 MG tablet Take 25 mg by mouth daily.   Yes [provider]  metFORMIN (GLUCOPHAGE) 500 MG tablet Take 250 mg by mouth every other day.   Yes [provider]  Multiple Vitamins-Minerals (CENTRUM ADULTS PO) Take 1 tablet by mouth daily.   Yes [provider]  potassium chloride (K-DUR) 10 MEQ tablet Take 30 mEq by mouth daily.   Yes [provider]  HYDROcodone-acetaminophen (NORCO/VICODIN) 5-325 MG per tablet Take 2 tablets by mouth every 4 (four) hours as needed. Patient not taking: Reported on 08/30/2015 08/26/15   Fransico Meadow, PA-C  ibuprofen (ADVIL,MOTRIN) 800 MG tablet Take 1 tablet (800 mg total) by mouth 3 (three) times daily. Patient not taking: Reported on 07/09/2017 08/30/15   Noemi Chapel, MD  lenalidomide (REVLIMID) 15 MG capsule Take 15 mg by mouth daily. For two weeks then off for three weeks. Then follow up doctor    [provider]  loperamide (IMODIUM) 2 MG capsule Take 2 mg by mouth as needed for diarrhea or loose stools.    [provider]    Physical Exam: Vitals:   07/09/17 1344 07/09/17 1347  BP:  131/70  Pulse:  75  Resp:  (!) 24  Temp:  98.9 F (37.2 C)  TempSrc:  Oral  Weight: 62.1 kg (137 lb)   Height: '5\' 9"'  (1.753 m)     Constitutional: NAD, calm, comfortable Eyes: PERRL, lids and conjunctivae normal ENMT: Mucous membranes are moist. Posterior pharynx clear of any exudate or lesions. Neck: normal, supple, no masses, no thyromegaly Respiratory: clear to auscultation bilaterally, no wheezing, no crackles. Normal respiratory effort. No accessory muscle use.  Cardiovascular: Regular rate and rhythm, no murmurs / rubs / gallops. No extremity edema. 2+ pedal pulses. No carotid bruits.  Abdomen: Soft, no tenderness, no masses palpated. No hepatosplenomegaly. Bowel sounds positive.  Musculoskeletal: Tender lumbar spine per Dr. Roderic Palau (not reexamined to avoid discomfort), no clubbing / cyanosis. Good ROM, no contractures. Normal muscle tone.  Skin: no significant rashes,  lesions, ulcers on limited skin exam Neurologic: CN 2-12 grossly intact. Sensation intact, DTR normal. Strength 5/5 in all 4.  Psychiatric: Normal judgment and insight. Alert and oriented x 3. Normal mood.     Labs on Admission: I have personally reviewed following labs and imaging studies  CBC:  Recent Labs Lab 07/09/17 1352  WBC 7.4  NEUTROABS 6.3  HGB 9.7*  HCT 29.5*  MCV 90.8  PLT 86*   Basic Metabolic Panel:  Recent Labs Lab 07/09/17 1352  NA 137  K 4.1  CL 106  CO2 24  GLUCOSE 139*  BUN 21*  CREATININE 1.33*  CALCIUM 9.4   GFR: Estimated Creatinine Clearance: 37.5 mL/min (A) (by C-G formula based on SCr of 1.33 mg/dL (H)). Liver Function Tests:  Recent Labs Lab 07/09/17 1352  AST 43*  ALT 32  ALKPHOS 39  BILITOT 1.0  PROT 8.3*  ALBUMIN 3.5   No results for input(s): LIPASE, AMYLASE in the last 168 hours. No results for input(s): AMMONIA in the last 168 hours. Coagulation Profile: No results for input(s): INR, PROTIME in the last 168  hours. Cardiac Enzymes:  Recent Labs Lab 07/09/17 1352  TROPONINI 0.08*   BNP (last 3 results) No results for input(s): PROBNP in the last 8760 hours. HbA1C: No results for input(s): HGBA1C in the last 72 hours. CBG: No results for input(s): GLUCAP in the last 168 hours. Lipid Profile: No results for input(s): CHOL, HDL, LDLCALC, TRIG, CHOLHDL, LDLDIRECT in the last 72 hours. Thyroid Function Tests: No results for input(s): TSH, T4TOTAL, FREET4, T3FREE, THYROIDAB in the last 72 hours. Anemia Panel: No results for input(s): VITAMINB12, FOLATE, FERRITIN, TIBC, IRON, RETICCTPCT in the last 72 hours. Urine analysis:    Component Value Date/Time   COLORURINE YELLOW 08/26/2015 1130   APPEARANCEUR CLEAR 08/26/2015 1130   LABSPEC <1.005 (L) 08/26/2015 1130   PHURINE 6.0 08/26/2015 1130   GLUCOSEU NEGATIVE 08/26/2015 1130   HGBUR NEGATIVE 08/26/2015 1130   BILIRUBINUR NEGATIVE 08/26/2015 1130   KETONESUR  NEGATIVE 08/26/2015 1130   PROTEINUR NEGATIVE 08/26/2015 1130   UROBILINOGEN 0.2 08/26/2015 1130   NITRITE NEGATIVE 08/26/2015 1130   LEUKOCYTESUR NEGATIVE 08/26/2015 1130    Radiological Exams on Admission: Dg Lumbar Spine Complete  Result Date: 07/09/2017 CLINICAL DATA:  Pain of the pelvis and lower back extending into bilateral hips. EXAM: LUMBAR SPINE - COMPLETE 4+ VIEW COMPARISON:  Chest x-ray January 19, 2011 FINDINGS: There is 50% compression deformity of L1, new since prior chest x-ray of February 2012. The chronicity is indeterminate. If the patient has focal pain here, acute fracture is not excluded. Degenerative joint changes at L5-S1 are noted. There is scoliosis of spine. IMPRESSION: There is 50% compression deformity of L1, new since prior chest x-ray of February 2012. The chronicity is indeterminate. If the patient has focal pain here, acute fracture is not excluded. Electronically Signed   By: Abelardo Diesel M.D.   On: 07/09/2017 15:08   Ct Head Wo Contrast  Result Date: 07/09/2017 CLINICAL DATA:  Syncopal episode yesterday EXAM: CT HEAD WITHOUT CONTRAST TECHNIQUE: Contiguous axial images were obtained from the base of the skull through the vertex without intravenous contrast. COMPARISON:  08/26/2015 FINDINGS: Brain: Bifrontal encephalomalacia changes are again seen and stable. No findings to suggest acute hemorrhage, acute infarction or space-occupying mass lesion are noted. Small calcification is noted just to the left of the sella turcica likely related to a small meningioma. It is stable in appearance. Vascular: No hyperdense vessel or unexpected calcification. Skull: Postsurgical changes are seen. A lucent lesion is noted within the right parietal bone stable from the prior exam Sinuses/Orbits: No acute finding. Other: Mild scalp hematoma is noted in the left parietal region likely related to recent injury. IMPRESSION: Chronic changes similar to that noted on the prior exam. No  acute intracranial abnormality is noted. Small scalp hematoma is noted on the left. Electronically Signed   By: [name] M.D.   On: 07/09/2017 14:51    EKG: Independently reviewed. Vent. rate 77 BPM PR interval * ms QRS duration 86 ms QT/QTc 413/468 ms P-R-T axes 49 35 23 Sinus rhythm Baseline wander in lead(s) III V5  Assessment/Plan Principal Problem:   Syncope Observation/telemetry. Trend troponin levels. Neuro checks every 4 hours. Check echocardiogram in a.m. Check carotid Doppler in a.m. Consider cardiology evaluation and troponin levels trending up.  Active Problems:   Lumbar compression fracture (HCC) Analgesics as needed. PT and OT evaluation in hand. Case management evaluation in a.m.    Multiple myeloma (HCC) Continue Revlimid 15 mg by mouth daily per oncology instructions. Monitor CBC.  Benign hypertension Continue losartan 100 mg by mouth daily. Monitor blood pressure, renal function and electrolytes.    Hypothyroidism Continue levothyroxine 25 g by mouth daily. Check TSH level.    Diabetes mellitus without complication (HCC) Carbohydrate modified diet. Continue metformin 250 every other day. CBG monitoring before meals and at bedtime while in the hospital.    Elevated troponin Trend troponin levels. Repeat EKG in the morning. Check echocardiogram in a.m.    Thrombocytopenia (HCC) Monitor platelet count.    DVT prophylaxis: SCDs. Code Status: Full code. Family Communication: Her sister was present in the emergency department. Disposition Plan: Admit for troponin levels cycling them further workup. Consults called:  Admission status: Observation/telemetry.   Reubin Milan MD [facility] Pager [phone].  If 7PM-7AM, please contact night-coverage [email or web address] Password [facility]  07/09/2017, 5:13 PM

## 2017-07-09 NOTE — ED Provider Notes (Signed)
[facility] DEPT Provider Note   CSN: [id number] Arrival date & time: 07/09/17  1340     History   Chief Complaint Chief Complaint  Patient presents with  . Loss of Consciousness    HPI Amanda Wu is a 71 y.o. female.  Patient states that yesterday she had breakfast and then went into another room and passed out. Patient does not remember being dizzy. She does complain of lower back pain today.   The history is provided by the patient. No language interpreter was used.  Loss of Consciousness   This is a new problem. The current episode started 12 to 24 hours ago. The problem occurs rarely. The problem has been resolved. She lost consciousness for a period of 1 to 5 minutes. The problem is associated with normal activity. Associated symptoms include back pain. Pertinent negatives include abdominal pain, chest pain, congestion, headaches, seizures and visual change.    Past Medical History:  Diagnosis Date  . Benign hypertension   . Cataract   . 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 Diabetes mellitus without complication ([place])   . H/O partial nephrectomy   . Hypokalemia   . Hypothyroidism   . Impaired cognition   . Multiple myeloma ([place])   . Thyroid disease     Patient Active Problem List   Diagnosis Date Noted  . Syncope 07/09/2017    Past Surgical History:  Procedure Laterality Date  . ABDOMINAL HYSTERECTOMY    . YAG LASER APPLICATION Right 6/37/8588   Procedure: YAG LASER APPLICATION;  Surgeon: [name], MD;  Location: [facility];  Service: Ophthalmology;  Laterality: Right;    OB History    No data available       Home Medications    Prior to Admission medications   Medication Sig Start Date End Date Taking? Authorizing Provider  acetaminophen (TYLENOL) 500 MG tablet Take 1,000 mg by mouth every 6 (six) hours as needed for mild pain.    Yes [provider]  amLODipine (NORVASC) 5 MG tablet Take 5 mg by mouth daily.    Yes  [provider]  dexamethasone (DECADRON) 4 MG tablet Take 4 mg by mouth. Take 5 tablets every Monday when you have chemo therapy.   Yes [provider]  diphenhydramine-acetaminophen (TYLENOL PM) 25-500 MG TABS Take 1 tablet by mouth at bedtime as needed (sleep/pain).    Yes [provider]  ferrous sulfate 325 (65 FE) MG tablet Take 325 mg by mouth daily with breakfast.   Yes [provider]  levothyroxine (SYNTHROID, LEVOTHROID) 25 MCG tablet Take 25 mcg by mouth daily before breakfast.   Yes [provider]  losartan (COZAAR) 25 MG tablet Take 25 mg by mouth daily.   Yes [provider]  metFORMIN (GLUCOPHAGE) 500 MG tablet Take 250 mg by mouth every other day.   Yes [provider]  Multiple Vitamins-Minerals (CENTRUM ADULTS PO) Take 1 tablet by mouth daily.   Yes [provider]  potassium chloride (K-DUR) 10 MEQ tablet Take 30 mEq by mouth daily.   Yes [provider]  HYDROcodone-acetaminophen (NORCO/VICODIN) 5-325 MG per tablet Take 2 tablets by mouth every 4 (four) hours as needed. Patient not taking: Reported on 08/30/2015 08/26/15   Fransico Meadow, PA-C  ibuprofen (ADVIL,MOTRIN) 800 MG tablet Take 1 tablet (800 mg total) by mouth 3 (three) times daily. Patient not taking: Reported on 07/09/2017 08/30/15   Noemi Chapel, MD  lenalidomide (REVLIMID) 15  MG capsule Take 15 mg by mouth daily. For two weeks then off for three weeks. Then follow up doctor    [provider]  loperamide (IMODIUM) 2 MG capsule Take 2 mg by mouth as needed for diarrhea or loose stools.    [provider]    Family History History reviewed. No pertinent family history.  Social History Social History  Substance Use Topics  . Smoking status: Never Smoker  . Smokeless tobacco: Never Used  . Alcohol use No     Allergies   Lotensin [benazepril hcl]   Review of Systems Review of Systems  Constitutional:  Negative for appetite change and fatigue.  HENT: Negative for congestion, ear discharge and sinus pressure.   Eyes: Negative for discharge.  Respiratory: Negative for cough.   Cardiovascular: Positive for syncope. Negative for chest pain.  Gastrointestinal: Negative for abdominal pain and diarrhea.  Genitourinary: Negative for frequency and hematuria.  Musculoskeletal: Positive for back pain.  Skin: Negative for rash.  Neurological: Positive for syncope. Negative for seizures and headaches.  Psychiatric/Behavioral: Negative for hallucinations.     Physical Exam Updated Vital Signs BP 131/70 (BP Location: Left Arm)   Pulse 75   Temp 98.9 F (37.2 C) (Oral)   Resp (!) 24   Ht '5\' 9"'  (1.753 m)   Wt 62.1 kg (137 lb)   BMI 20.23 kg/m   Physical Exam  Constitutional: She is oriented to person, place, and time. She appears well-developed.  HENT:  Head: Normocephalic.  Eyes: Conjunctivae and EOM are normal. No scleral icterus.  Neck: Neck supple. No thyromegaly present.  Cardiovascular: Normal rate and regular rhythm.  Exam reveals no gallop and no friction rub.   No murmur heard. Pulmonary/Chest: No stridor. She has no wheezes. She has no rales. She exhibits no tenderness.  Abdominal: She exhibits no distension. There is no tenderness. There is no rebound.  Musculoskeletal: Normal range of motion. She exhibits no edema.  Tender lumbar spine  Lymphadenopathy:    She has no cervical adenopathy.  Neurological: She is oriented to person, place, and time. Abnormal reflex: she also has elevated troponin. She exhibits normal muscle tone. Coordination normal.  Skin: No rash noted. No erythema.  Psychiatric: She has a normal mood and affect. Her behavior is normal.     ED Treatments / Results  Labs (all labs ordered are listed, but only abnormal results are displayed) Labs Reviewed  BASIC METABOLIC PANEL - Abnormal; Notable for the following:       Result Value   Glucose, Bld 139  (*)    BUN 21 (*)    Creatinine, Ser 1.33 (*)    GFR calc non Af Amer 39 (*)    GFR calc Af Amer 45 (*)    All other components within normal limits  CBC WITH DIFFERENTIAL/PLATELET - Abnormal; Notable for the following:    RBC 3.25 (*)    Hemoglobin 9.7 (*)    HCT 29.5 (*)    RDW 17.0 (*)    Platelets 86 (*)    Lymphs Abs 0.6 (*)    All other components within normal limits  TROPONIN I - Abnormal; Notable for the following:    Troponin I 0.08 (*)    All other components within normal limits  HEPATIC FUNCTION PANEL - Abnormal; Notable for the following:    Total Protein 8.3 (*)    AST 43 (*)    All other components within normal limits  URINALYSIS, ROUTINE W  REFLEX MICROSCOPIC    EKG  EKG Interpretation  Date/Time:  Thursday July 09 2017 13:47:24 EDT Ventricular Rate:  77 PR Interval:    QRS Duration: 86 QT Interval:  413 QTC Calculation: 468 R Axis:   35 Text Interpretation:  Sinus rhythm Baseline wander in lead(s) III V5 Confirmed by Milton Ferguson [phone]) on 07/09/2017 3:49:22 PM       Radiology Dg Lumbar Spine Complete  Result Date: 07/09/2017 CLINICAL DATA:  Pain of the pelvis and lower back extending into bilateral hips. EXAM: LUMBAR SPINE - COMPLETE 4+ VIEW COMPARISON:  Chest x-ray January 19, 2011 FINDINGS: There is 50% compression deformity of L1, new since prior chest x-ray of February 2012. The chronicity is indeterminate. If the patient has focal pain here, acute fracture is not excluded. Degenerative joint changes at L5-S1 are noted. There is scoliosis of spine. IMPRESSION: There is 50% compression deformity of L1, new since prior chest x-ray of February 2012. The chronicity is indeterminate. If the patient has focal pain here, acute fracture is not excluded. Electronically Signed   By: Abelardo Diesel M.D.   On: 07/09/2017 15:08   Ct Head Wo Contrast  Result Date: 07/09/2017 CLINICAL DATA:  Syncopal episode yesterday EXAM: CT HEAD WITHOUT CONTRAST TECHNIQUE:  Contiguous axial images were obtained from the base of the skull through the vertex without intravenous contrast. COMPARISON:  08/26/2015 FINDINGS: Brain: Bifrontal encephalomalacia changes are again seen and stable. No findings to suggest acute hemorrhage, acute infarction or space-occupying mass lesion are noted. Small calcification is noted just to the left of the sella turcica likely related to a small meningioma. It is stable in appearance. Vascular: No hyperdense vessel or unexpected calcification. Skull: Postsurgical changes are seen. A lucent lesion is noted within the right parietal bone stable from the prior exam Sinuses/Orbits: No acute finding. Other: Mild scalp hematoma is noted in the left parietal region likely related to recent injury. IMPRESSION: Chronic changes similar to that noted on the prior exam. No acute intracranial abnormality is noted. Small scalp hematoma is noted on the left. Electronically Signed   By: [name] M.D.   On: 07/09/2017 14:51    Procedures Procedures (including critical care time)  Medications Ordered in ED Medications  HYDROmorphone (DILAUDID) injection 0.5 mg (not administered)     Initial Impression / Assessment and Plan / ED Course  I have reviewed the triage vital signs and the nursing notes.  Pertinent labs & imaging results that were available during my care of the patient were reviewed by me and considered in my medical decision making (see chart for details).     Patient with syncope of unknown cause. Patient has compression fracture of L1. She also has an elevated troponin and will be admitted  Final Clinical Impressions(s) / ED Diagnoses   Final diagnoses:  Syncope and collapse    New Prescriptions New Prescriptions   No medications on file     Milton Ferguson, MD 07/09/17 1620

## 2017-07-09 NOTE — ED Notes (Signed)
Pt states pain is across pelvis andlower back into both hips. Right hips worse than left.hob put down for ekg and pt had pain to lower back. No leg shortening at this time.

## 2017-07-10 ENCOUNTER — Observation Stay (HOSPITAL_COMMUNITY): Payer: Medicare PPO

## 2017-07-10 ENCOUNTER — Other Ambulatory Visit: Payer: Self-pay | Admitting: Adult Health

## 2017-07-10 ENCOUNTER — Observation Stay (HOSPITAL_BASED_OUTPATIENT_CLINIC_OR_DEPARTMENT_OTHER): Payer: Medicare PPO

## 2017-07-10 DIAGNOSIS — I313 Pericardial effusion (noninflammatory): Secondary | ICD-10-CM | POA: Diagnosis not present

## 2017-07-10 DIAGNOSIS — R55 Syncope and collapse: Secondary | ICD-10-CM | POA: Diagnosis not present

## 2017-07-10 LAB — URINALYSIS, ROUTINE W REFLEX MICROSCOPIC
Bilirubin Urine: NEGATIVE
Glucose, UA: NEGATIVE mg/dL
KETONES UR: NEGATIVE mg/dL
NITRITE: POSITIVE — AB
PH: 6 (ref 5.0–8.0)
PROTEIN: NEGATIVE mg/dL
Specific Gravity, Urine: 1.015 (ref 1.005–1.030)

## 2017-07-10 LAB — BASIC METABOLIC PANEL
Anion gap: 5 (ref 5–15)
BUN: 21 mg/dL — ABNORMAL HIGH (ref 6–20)
CHLORIDE: 110 mmol/L (ref 101–111)
CO2: 25 mmol/L (ref 22–32)
CREATININE: 1.19 mg/dL — AB (ref 0.44–1.00)
Calcium: 8.7 mg/dL — ABNORMAL LOW (ref 8.9–10.3)
GFR calc non Af Amer: 44 mL/min — ABNORMAL LOW (ref >60)
GFR, EST AFRICAN AMERICAN: 52 mL/min — AB (ref >60)
Glucose, Bld: 106 mg/dL — ABNORMAL HIGH (ref 65–99)
POTASSIUM: 3.6 mmol/L (ref 3.5–5.1)
SODIUM: 140 mmol/L (ref 135–145)

## 2017-07-10 LAB — CBC WITH DIFFERENTIAL/PLATELET
BASOS PCT: 0 %
Basophils Absolute: 0 10*3/uL (ref 0.0–0.1)
Eosinophils Absolute: 0.3 10*3/uL (ref 0.0–0.7)
Eosinophils Relative: 4 %
HEMATOCRIT: 28.5 % — AB (ref 36.0–46.0)
HEMOGLOBIN: 9.6 g/dL — AB (ref 12.0–15.0)
LYMPHS ABS: 1.1 10*3/uL (ref 0.7–4.0)
Lymphocytes Relative: 19 %
MCH: 30.8 pg (ref 26.0–34.0)
MCHC: 33.7 g/dL (ref 30.0–36.0)
MCV: 91.3 fL (ref 78.0–100.0)
MONOS PCT: 8 %
Monocytes Absolute: 0.4 10*3/uL (ref 0.1–1.0)
NEUTROS ABS: 4 10*3/uL (ref 1.7–7.7)
NEUTROS PCT: 69 %
Platelets: 69 10*3/uL — ABNORMAL LOW (ref 150–400)
RBC: 3.12 MIL/uL — AB (ref 3.87–5.11)
RDW: 17.1 % — ABNORMAL HIGH (ref 11.5–15.5)
WBC: 5.8 10*3/uL (ref 4.0–10.5)

## 2017-07-10 LAB — GLUCOSE, CAPILLARY
GLUCOSE-CAPILLARY: 144 mg/dL — AB (ref 65–99)
GLUCOSE-CAPILLARY: 101 mg/dL — AB (ref 65–99)
GLUCOSE-CAPILLARY: 103 mg/dL — AB (ref 65–99)
Glucose-Capillary: 92 mg/dL (ref 65–99)

## 2017-07-10 LAB — D-DIMER, QUANTITATIVE: D-Dimer, Quant: 8.94 ug/mL-FEU — ABNORMAL HIGH (ref 0.00–0.50)

## 2017-07-10 LAB — ECHOCARDIOGRAM COMPLETE
Height: 69 in
Weight: 2201.6 oz

## 2017-07-10 LAB — URINALYSIS, MICROSCOPIC (REFLEX)

## 2017-07-10 LAB — TROPONIN I: TROPONIN I: 0.09 ng/mL — AB (ref <0.03)

## 2017-07-10 MED ORDER — LENALIDOMIDE 15 MG PO CAPS
15.0000 mg | ORAL_CAPSULE | Freq: Every day | ORAL | Status: AC
  Administered 2017-07-10: 15 mg via ORAL

## 2017-07-10 MED ORDER — HYDROCODONE-ACETAMINOPHEN 5-325 MG PO TABS
1.0000 | ORAL_TABLET | ORAL | Status: DC | PRN
  Administered 2017-07-10 – 2017-07-13 (×5): 1 via ORAL
  Filled 2017-07-10 (×5): qty 1

## 2017-07-10 MED ORDER — LENALIDOMIDE 15 MG PO CAPS
15.0000 mg | ORAL_CAPSULE | Freq: Every day | ORAL | Status: DC
  Filled 2017-07-10: qty 1

## 2017-07-10 NOTE — Care Management Note (Signed)
Case Management Note  Patient Details  Name: AKIYA MORR MRN: [id number] Date of Birth: 1946-07-14  Subjective/Objective:                  Admitted with syncope. From home with sisters, very involved family. Pt ind pta, drives sometimes. No HH or DME pta. Family anticipates pt will need assistance at DC. Pt has long term care insurance policy.   Action/Plan: PT eval pending. PD list given and family aware they are responsible for contacting agencies to establish care.  Expected Discharge Date:       07/11/2017           Expected Discharge Plan:     In-House Referral:  NA  Discharge planning Services  CM Consult  Post Acute Care Choice:    Choice offered to:     DME Arranged:    DME Agency:     HH Arranged:    HH Agency:     Status of Service:  In process, will continue to follow  If discussed at Long Length of Stay Meetings, dates discussed:    Additional Comments:  Sherald Barge, RN 07/10/2017, 3:11 PM

## 2017-07-10 NOTE — Progress Notes (Signed)
[facility] Progress Note  Patient: Amanda Wu LSL:[id number]   PCP: Vedia Coffer, MD DOB: [birth year]   DOA: 07/09/2017   DOS: 07/10/2017   Date of Service: the patient was seen and examined on 07/10/2017  Subjective: Currently complains about left-sided flank pain. No nausea no vomiting no dizziness no lightheadedness no focal deficit. No chest pain no palpitation. Has chronic incontinence. Does not remember the actual syncope event. Denies having any similar symptoms in the recent past. No recent change in medication.  Brief hospital course: Pt. with PMH of multiple myeloma in remission on Revlimid, hypertension, CNS lymphoma, type II DM, partial nephrectomy, hypothyroidism; admitted on 07/09/2017, presented with complaint of syncope, was found to have compression fracture with idiopathic syncope. Currently further plan is further workup of the syncope.  Assessment and Plan: 1. Syncope. Patient does not have any focal neurological deficit, CT head unremarkable. Patient does not remember what actually happened. Orthostatic vitals are negative. Neurological examination unremarkable. Telemetry monitoring so far reveals no acute abnormality. Echocardiogram and carotid Doppler pending. EKGs are also unremarkable. D-dimer is mildly elevated, patient is on Revlimid for multiple myeloma, and has progressive thrombocytopenia;  with presentation with syncope I will rule out PE with a CT chest PE protocol. Continue to monitor on telemetry. Already discussed with cardiology and arrange 30 day outpatient monitoring.  2. L1 compression fracture. Left flank pain. Patient does have some pain on the left flank. After the fall has sustained an L1 compression fracture. No focal deficit on examination on the leg. Continue PTOT, pain control.  3. Essential hypertension Agent has been taking amlodipine as well as losartan chronically. Denies having any recent change in blood pressure medication. No  orthostatic drop in the blood pressure as well. I'll currently hold his medications in the hospital.  4. Type 2 diabetes mellitus. Next and on metformin at home, currently on hold. We'll place her on sliding scale insulin.  5. Thrombocytopenia. Etiology currently unclear. Possible Revlimid but the patient actually has been taking this medication for a while. In the setting of elevated d-dimer as well as syncope, if the patient has a PE, she can have thrombocytopenia due to consumption. We'll monitor the results.  6. Hypothyroidism. Continue Synthroid.  Diet: cardiac diet DVT Prophylaxis: subcutaneous Heparin  Advance goals of care discussion: full code  Family Communication: no family was present at bedside, at the time of interview.   Disposition:  Discharge to home.  Consultants: noen Procedures: none  Antibiotics: Anti-infectives    None       Objective: Physical Exam: Vitals:   07/09/17 2136 07/10/17 0147 07/10/17 0547 07/10/17 0930  BP:  120/63 120/61 113/65  Pulse:  85 80 82  Resp:  18 16   Temp:  99.5 F (37.5 C) 99.5 F (37.5 C)   TempSrc:  Oral Oral   SpO2:  95% 95% 95%  Weight: 62.4 kg (137 lb 9.6 oz)     Height:        Intake/Output Summary (Last 24 hours) at 07/10/17 1305 Last data filed at 07/10/17 0640  Gross per 24 hour  Intake              603 ml  Output              200 ml  Net              403 ml   Filed Weights   07/09/17 1344 07/09/17 2136  Weight: 62.1 kg (137 lb)  62.4 kg (137 lb 9.6 oz)   General: Alert, Awake and Oriented to Time, Place and Person. Appear in mild distress, affect appropriate Eyes: PERRL, Conjunctiva normal ENT: Oral Mucosa clear moist. Neck: no JVD, no Abnormal Mass Or lumps Cardiovascular: S1 and S2 Present, no Murmur, Peripheral Pulses Present Respiratory: normal respiratory effort, Bilateral Air entry equal and Decreased, no use of accessory muscle, Clear to Auscultation, no Crackles, no wheezes Abdomen:  Bowel Sound present, Soft and no tenderness, no hernia Skin: no redness, no Rash, no induration Extremities: no Pedal edema, no calf tenderness Neurologic: Grossly no focal neuro deficit. Bilaterally Equal motor strength  Data Reviewed: CBC:  Recent Labs Lab 07/09/17 1352 07/10/17 0436  WBC 7.4 5.8  NEUTROABS 6.3 4.0  HGB 9.7* 9.6*  HCT 29.5* 28.5*  MCV 90.8 91.3  PLT 86* 69*   Basic Metabolic Panel:  Recent Labs Lab 07/09/17 1352 07/09/17 1659 07/10/17 0436  NA 137  --  140  K 4.1  --  3.6  CL 106  --  110  CO2 24  --  25  GLUCOSE 139*  --  106*  BUN 21*  --  21*  CREATININE 1.33*  --  1.19*  CALCIUM 9.4  --  8.7*  MG  --  2.0  --     Liver Function Tests:  Recent Labs Lab 07/09/17 1352  AST 43*  ALT 32  ALKPHOS 39  BILITOT 1.0  PROT 8.3*  ALBUMIN 3.5   No results for input(s): LIPASE, AMYLASE in the last 168 hours. No results for input(s): AMMONIA in the last 168 hours. Coagulation Profile: No results for input(s): INR, PROTIME in the last 168 hours. Cardiac Enzymes:  Recent Labs Lab 07/09/17 1352 07/09/17 1659 07/09/17 2244 07/10/17 0436  TROPONINI 0.08* 0.08* 0.09* 0.09*   BNP (last 3 results) No results for input(s): PROBNP in the last 8760 hours. CBG:  Recent Labs Lab 07/09/17 2151 07/10/17 0749 07/10/17 1119  GLUCAP 91 92 144*   Studies: Dg Lumbar Spine Complete  Result Date: 07/09/2017 CLINICAL DATA:  Pain of the pelvis and lower back extending into bilateral hips. EXAM: LUMBAR SPINE - COMPLETE 4+ VIEW COMPARISON:  Chest x-ray January 19, 2011 FINDINGS: There is 50% compression deformity of L1, new since prior chest x-ray of February 2012. The chronicity is indeterminate. If the patient has focal pain here, acute fracture is not excluded. Degenerative joint changes at L5-S1 are noted. There is scoliosis of spine. IMPRESSION: There is 50% compression deformity of L1, new since prior chest x-ray of February 2012. The chronicity is  indeterminate. If the patient has focal pain here, acute fracture is not excluded. Electronically Signed   By: Abelardo Diesel M.D.   On: 07/09/2017 15:08   Ct Head Wo Contrast  Result Date: 07/09/2017 CLINICAL DATA:  Syncopal episode yesterday EXAM: CT HEAD WITHOUT CONTRAST TECHNIQUE: Contiguous axial images were obtained from the base of the skull through the vertex without intravenous contrast. COMPARISON:  08/26/2015 FINDINGS: Brain: Bifrontal encephalomalacia changes are again seen and stable. No findings to suggest acute hemorrhage, acute infarction or space-occupying mass lesion are noted. Small calcification is noted just to the left of the sella turcica likely related to a small meningioma. It is stable in appearance. Vascular: No hyperdense vessel or unexpected calcification. Skull: Postsurgical changes are seen. A lucent lesion is noted within the right parietal bone stable from the prior exam Sinuses/Orbits: No acute finding. Other: Mild scalp hematoma is noted in the  left parietal region likely related to recent injury. IMPRESSION: Chronic changes similar to that noted on the prior exam. No acute intracranial abnormality is noted. Small scalp hematoma is noted on the left. Electronically Signed   By: [name] M.D.   On: 07/09/2017 14:51   US Carotid Bilateral  Result Date: 07/10/2017 CLINICAL DATA:  71 year old female with a history of syncope. Cardiovascular risk factors include hypertension, diabetes EXAM: BILATERAL CAROTID DUPLEX ULTRASOUND TECHNIQUE: Pearline Cables scale imaging, color Doppler and duplex ultrasound were performed of bilateral carotid and vertebral arteries in the neck. COMPARISON:  No prior duplex FINDINGS: Criteria: Quantification of carotid stenosis is based on velocity parameters that correlate the residual internal carotid diameter with NASCET-based stenosis levels, using the diameter of the distal internal carotid lumen as the denominator for stenosis measurement. The following  velocity measurements were obtained: RIGHT ICA:  Systolic 80 cm/sec, Diastolic 30 cm/sec CCA:  53 cm/sec SYSTOLIC ICA/CCA RATIO:  1.5 ECA:  54 cm/sec LEFT ICA:  Systolic 83 cm/sec, Diastolic 26 cm/sec CCA:  62 cm/sec SYSTOLIC ICA/CCA RATIO:  1.3 ECA:  61 cm/sec Right Brachial SBP: Not acquired Left Brachial SBP: Not acquired RIGHT CAROTID ARTERY: No significant calcified disease of the right common carotid artery. Intermediate waveform maintained. Mild plaque without significant calcifications at the right carotid bifurcation. Low resistance waveform of the right ICA. No significant tortuosity. RIGHT VERTEBRAL ARTERY: Antegrade flow with low resistance waveform. LEFT CAROTID ARTERY: No significant calcified disease of the left common carotid artery. Intermediate waveform maintained. Mild plaque at the left carotid bifurcation without significant calcifications. Low resistance waveform of the left ICA. LEFT VERTEBRAL ARTERY:  Antegrade flow with low resistance waveform. IMPRESSION: Color duplex indicates minimal homogeneous plaque, with no hemodynamically significant stenosis by duplex criteria in the extracranial cerebrovascular circulation. Signed, Dulcy Fanny. Earleen Newport, DO [facility] Electronically Signed   By: Corrie Mckusick D.O.   On: 07/10/2017 10:16    Scheduled Meds: . ferrous sulfate  325 mg Oral Q breakfast  . lenalidomide  15 mg Oral Daily  . levothyroxine  25 mcg Oral QAC breakfast  . pneumococcal 23 valent vaccine  0.5 mL Intramuscular Tomorrow-1000  . potassium chloride  30 mEq Oral Daily  . sodium chloride flush  3 mL Intravenous Q12H   Continuous Infusions: PRN Meds: diphenhydrAMINE **AND** acetaminophen, HYDROcodone-acetaminophen  Time spent: 35 minutes  Author: Berle Mull, MD [facility] Pager: [phone] 07/10/2017 1:05 PM  If 7PM-7AM, please contact night-coverage at [email or web address], password [email or web address]

## 2017-07-10 NOTE — Care Management Obs Status (Signed)
[facility] NOTIFICATION   Patient Details  Name: Amanda Wu MRN: [id number] Date of Birth: 05/18/1946   Medicare Observation Status Notification Given:  Yes    Sherald Barge, RN 07/10/2017, 3:11 PM

## 2017-07-10 NOTE — Progress Notes (Signed)
*  PRELIMINARY RESULTS* Echocardiogram 2D Echocardiogram has been performed.  Leavy Cella 07/10/2017, 3:40 PM

## 2017-07-10 NOTE — Progress Notes (Signed)
PT Cancellation Note  Patient Details Name: Amanda Wu MRN: [id number] DOB: 03-27-1946   Cancelled Treatment:    Reason Eval/Treat Not Completed: Medical issues which prohibited therapy. Chart reviewed, RN consulted. Pt with elevated D-dimer and attending reports upcoming V/Q to r/o PE. Also with up-trending Troponin (0.08, 0.08, 0.09) which will need to be down-trending prior to working with PT, or patient will need to be cleared by attending. Will continue to monitor remotely. If pt needs to be seen by PT over the weekend, please place new order with "Imminent Discharge" status.   5:34 PM, 07/10/17 Etta Grandchild, PT, DPT Physical Therapist - [place] [phone] (Office)     Buccola,Allan C 07/10/2017, 5:30 PM

## 2017-07-11 ENCOUNTER — Observation Stay (HOSPITAL_COMMUNITY): Payer: Medicare PPO

## 2017-07-11 ENCOUNTER — Encounter (HOSPITAL_COMMUNITY): Payer: Self-pay

## 2017-07-11 DIAGNOSIS — R55 Syncope and collapse: Secondary | ICD-10-CM | POA: Diagnosis not present

## 2017-07-11 DIAGNOSIS — R748 Abnormal levels of other serum enzymes: Secondary | ICD-10-CM | POA: Diagnosis not present

## 2017-07-11 DIAGNOSIS — I10 Essential (primary) hypertension: Secondary | ICD-10-CM

## 2017-07-11 DIAGNOSIS — E119 Type 2 diabetes mellitus without complications: Secondary | ICD-10-CM | POA: Diagnosis not present

## 2017-07-11 LAB — CBC WITH DIFFERENTIAL/PLATELET
BASOS PCT: 1 %
Basophils Absolute: 0 10*3/uL (ref 0.0–0.1)
Eosinophils Absolute: 0.4 10*3/uL (ref 0.0–0.7)
Eosinophils Relative: 6 %
HEMATOCRIT: 29.7 % — AB (ref 36.0–46.0)
Hemoglobin: 10 g/dL — ABNORMAL LOW (ref 12.0–15.0)
LYMPHS PCT: 16 %
Lymphs Abs: 0.9 10*3/uL (ref 0.7–4.0)
MCH: 30.4 pg (ref 26.0–34.0)
MCHC: 33.7 g/dL (ref 30.0–36.0)
MCV: 90.3 fL (ref 78.0–100.0)
MONO ABS: 0.5 10*3/uL (ref 0.1–1.0)
MONOS PCT: 9 %
NEUTROS ABS: 4 10*3/uL (ref 1.7–7.7)
Neutrophils Relative %: 68 %
Platelets: 83 10*3/uL — ABNORMAL LOW (ref 150–400)
RBC: 3.29 MIL/uL — ABNORMAL LOW (ref 3.87–5.11)
RDW: 16.7 % — AB (ref 11.5–15.5)
WBC: 5.9 10*3/uL (ref 4.0–10.5)

## 2017-07-11 LAB — COMPREHENSIVE METABOLIC PANEL
ALBUMIN: 3.3 g/dL — AB (ref 3.5–5.0)
ALT: 50 U/L (ref 14–54)
ANION GAP: 8 (ref 5–15)
AST: 54 U/L — ABNORMAL HIGH (ref 15–41)
Alkaline Phosphatase: 46 U/L (ref 38–126)
BUN: 15 mg/dL (ref 6–20)
CO2: 25 mmol/L (ref 22–32)
Calcium: 8.8 mg/dL — ABNORMAL LOW (ref 8.9–10.3)
Chloride: 106 mmol/L (ref 101–111)
Creatinine, Ser: 1.07 mg/dL — ABNORMAL HIGH (ref 0.44–1.00)
GFR calc Af Amer: 59 mL/min — ABNORMAL LOW (ref >60)
GFR calc non Af Amer: 51 mL/min — ABNORMAL LOW (ref >60)
GLUCOSE: 106 mg/dL — AB (ref 65–99)
POTASSIUM: 3.3 mmol/L — AB (ref 3.5–5.1)
SODIUM: 139 mmol/L (ref 135–145)
Total Bilirubin: 1.1 mg/dL (ref 0.3–1.2)
Total Protein: 7.9 g/dL (ref 6.5–8.1)

## 2017-07-11 LAB — GLUCOSE, CAPILLARY
GLUCOSE-CAPILLARY: 103 mg/dL — AB (ref 65–99)
Glucose-Capillary: 98 mg/dL (ref 65–99)
Glucose-Capillary: 174 mg/dL — ABNORMAL HIGH (ref 65–99)

## 2017-07-11 LAB — PROTIME-INR
INR: 1.04
Prothrombin Time: 13.6 seconds (ref 11.4–15.2)

## 2017-07-11 LAB — MAGNESIUM: Magnesium: 1.8 mg/dL (ref 1.7–2.4)

## 2017-07-11 MED ORDER — POTASSIUM CHLORIDE CRYS ER 20 MEQ PO TBCR
30.0000 meq | EXTENDED_RELEASE_TABLET | Freq: Three times a day (TID) | ORAL | Status: AC
  Administered 2017-07-11 (×2): 30 meq via ORAL
  Filled 2017-07-11 (×2): qty 1

## 2017-07-11 MED ORDER — TECHNETIUM TO 99M ALBUMIN AGGREGATED
4.0000 | Freq: Once | INTRAVENOUS | Status: AC | PRN
  Administered 2017-07-11: 4 via INTRAVENOUS

## 2017-07-11 MED ORDER — ENOXAPARIN SODIUM 80 MG/0.8ML ~~LOC~~ SOLN
70.0000 mg | Freq: Two times a day (BID) | SUBCUTANEOUS | Status: DC
  Administered 2017-07-11 (×2): 70 mg via SUBCUTANEOUS
  Filled 2017-07-11 (×2): qty 0.8

## 2017-07-11 MED ORDER — TECHNETIUM TC 99M DIETHYLENETRIAME-PENTAACETIC ACID
30.0000 | Freq: Once | INTRAVENOUS | Status: AC | PRN
  Administered 2017-07-11: 30 via INTRAVENOUS

## 2017-07-11 NOTE — Progress Notes (Signed)
ANTICOAGULATION CONSULT NOTE - Initial Consult  Pharmacy Consult for Lovenox Indication: pulmonary embolus  Allergies  Allergen Reactions  . Lotensin [Benazepril Hcl]     Facial swelling    Patient Measurements: Height: 5' 9" (175.3 cm) Weight: 144 lb 10 oz (65.6 kg) IBW/kg (Calculated) : 66.2  Vital Signs: Temp: 99.3 F (37.4 C) (07/28 0458) Temp Source: Oral (07/28 0458) BP: 135/73 (07/28 0458) Pulse Rate: 80 (07/28 0458)  Labs:  Recent Labs  07/09/17 1352 07/09/17 1659 07/09/17 2244 07/10/17 0436 07/11/17 0637  HGB 9.7*  --   --  9.6* 10.0*  HCT 29.5*  --   --  28.5* 29.7*  PLT 86*  --   --  69* 83*  LABPROT  --   --   --   --  13.6  INR  --   --   --   --  1.04  CREATININE 1.33*  --   --  1.19* 1.07*  TROPONINI 0.08* 0.08* 0.09* 0.09*  --     Estimated Creatinine Clearance: 49.2 mL/min (A) (by C-G formula based on SCr of 1.07 mg/dL (H)).   Medical History: Past Medical History:  Diagnosis Date  . Benign hypertension   . Cataract   . 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 Diabetes mellitus without complication ([place])   . H/O partial nephrectomy   . Hypokalemia   . Hypothyroidism   . Impaired cognition   . Multiple myeloma ([place])   . Thyroid disease     Medications:  Prescriptions Prior to Admission  Medication Sig Dispense Refill Last Dose  . acetaminophen (TYLENOL) 500 MG tablet Take 1,000 mg by mouth every 6 (six) hours as needed for mild pain.    07/08/2017 at Unknown time  . amLODipine (NORVASC) 5 MG tablet Take 5 mg by mouth daily.    07/09/2017 at Unknown time  . dexamethasone (DECADRON) 4 MG tablet Take 4 mg by mouth. Take 5 tablets every Monday when you have chemo therapy.   Past Month at Unknown time  . diphenhydramine-acetaminophen (TYLENOL PM) 25-500 MG TABS Take 1 tablet by mouth at bedtime as needed (sleep/pain).    Past Month at Unknown time  . ferrous sulfate 325 (65 FE) MG tablet Take 325 mg by mouth daily with breakfast.   07/09/2017  at Unknown time  . levothyroxine (SYNTHROID, LEVOTHROID) 25 MCG tablet Take 25 mcg by mouth daily before breakfast.   07/09/2017 at Unknown time  . losartan (COZAAR) 100 MG tablet Take 100 mg by mouth daily.     . metFORMIN (GLUCOPHAGE) 500 MG tablet Take 250 mg by mouth every other day.   07/08/2017 at Unknown time  . Multiple Vitamins-Minerals (CENTRUM ADULTS PO) Take 1 tablet by mouth daily.   07/09/2017 at Unknown time  . potassium chloride (K-DUR) 10 MEQ tablet Take 30 mEq by mouth daily.   07/09/2017 at Unknown time  . HYDROcodone-acetaminophen (NORCO/VICODIN) 5-325 MG per tablet Take 2 tablets by mouth every 4 (four) hours as needed. (Patient not taking: Reported on 08/30/2015) 16 tablet 0 Not Taking at Unknown time  . ibuprofen (ADVIL,MOTRIN) 800 MG tablet Take 1 tablet (800 mg total) by mouth 3 (three) times daily. (Patient not taking: Reported on 07/09/2017) 21 tablet 0 Not Taking at Unknown time  . lenalidomide (REVLIMID) 15 MG capsule Take 15 mg by mouth daily. For two weeks then off for three weeks. Then follow up doctor   Not Taking at Unknown time  . loperamide (  IMODIUM) 2 MG capsule Take 2 mg by mouth as needed for diarrhea or loose stools.   Not Taking at Unknown time    Assessment: 71 yo female with PMH of multiple myeloma in remission on Revlimid, hypertension, CNS lymphoma, type II DM, partial nephrectomy, hypothyroidism. Patient presented with complaint of syncope, was found to have compression fracture. Elevated d-dimer. Pharmacy asked to start lovenox for suspected PE. Patient also has leg swelling and pending for venous US of legs. Of note, patients platelet count is 83,000, MD aware (she can have thrombocytopenia due to consumption and patient also has lymphoma). Monitor closely while on lovenox.   Goal of Therapy:  Monitor platelets by anticoagulation protocol: Yes   Plan:  Lovenox 47m/kg (751m sq q12h Monitor for S/S of bleeding F/U plan for oral anticoagulation and venous  [place]ults  LoIsac SarnaBS PhVena AustriaBCPS Clinical Pharmacist Pager #[phone]/28/2018,9:16 AM

## 2017-07-11 NOTE — Progress Notes (Addendum)
[facility] Progress Note  Patient: Amanda Wu FVC:[id number]   PCP: Vedia Coffer, MD DOB: 01/17/1946   DOA: 07/09/2017   DOS: 07/11/2017   Date of Service: the patient was seen and examined on 07/11/2017  Subjective: confused , not  Sure where she is ,speech intact  Brief hospital course: 71 yr old with PMH of multiple myeloma in remission on Revlimid, hypertension, CNS lymphoma, type II DM, partial nephrectomy, hypothyroidism; admitted on 07/09/2017, presented with complaint of syncope, was found to have compression fracture with idiopathic syncope. Currently further plan is further workup of the syncope.   Assessment and Plan: 1. Syncope. Patient does not have any focal neurological deficit, CT head unremarkable. Patient does not remember what actually happened.Telemetry shows normal sinus rhythm Orthostatic vitals are negative. Neurological examination unremarkable. Troponin slightly elevated Echocardiogram shows EF 60-65 with no regional wall motion abnormalities, grade 1 diastolic dysfunction and carotid Doppler no hemodynamically significant stenosis EKGs are also unremarkable. D-dimer is significantly elevated, patient is on Revlimid for multiple myeloma, and has progressive thrombocytopenia;  with presentation with syncope-CT PE protocol expedited, venous Doppler to rule out DVT Empiric Lovenox pending PE/DVT workup    2. L1 compression fracture. Left flank pain. Patient does have some pain on the left flank. After the fall has sustained an L1 compression fracture. No focal deficit on examination on the leg. Continue PTOT, pain control.  3. Essential hypertension Agent has been taking amlodipine as well as losartan chronically. Denies having any recent change in blood pressure medication. No orthostatic drop in the blood pressure as well. Resume antihypertensive medications prior to discharge  4. Type 2 diabetes mellitus.  on metformin at home, currently on  hold. Continue sliding scale insulin.  5. Thrombocytopenia. Patient has a history of MGUS /MM/MALT LYMPHOMA  Followed at [facility], most recent platelet count 06/25/17 was 149 Possible Revlimid but the patient actually has been taking this medication for a while. In the setting of elevated d-dimer as well as syncope, if the patient has a PE, she can have thrombocytopenia due to consumption.  Monitor closely while on empiric lovenox   6. Hypothyroidism. Continue Synthroid.    Diet: cardiac diet DVT Prophylaxis: subcutaneous Heparin  Advance goals of care discussion: full code  Family Communication: no family was present at bedside, at the time of interview.   Disposition:  Discharge to home in am if work up negative   Consultants: none  Procedures: none  Antibiotics: Anti-infectives    None       Objective: Physical Exam: Vitals:   07/10/17 2132 07/11/17 0128 07/11/17 0243 07/11/17 0458  BP: 131/74 (!) 142/77  135/73  Pulse: 79 85  80  Resp: '20 18  18  ' Temp: 98.9 F (37.2 C) (!) 96.9 F (36.1 C)  99.3 F (37.4 C)  TempSrc: Oral Oral  Oral  SpO2: 100%   100%  Weight:   65.6 kg (144 lb 10 oz)   Height:        Intake/Output Summary (Last 24 hours) at 07/11/17 0858 Last data filed at 07/11/17 0620  Gross per 24 hour  Intake              480 ml  Output             2400 ml  Net            -1920 ml   Filed Weights   07/09/17 1344 07/09/17 2136 07/11/17 0243  Weight: 62.1 kg (  137 lb) 62.4 kg (137 lb 9.6 oz) 65.6 kg (144 lb 10 oz)   General: pleasantly confused  Eyes: PERRL, Conjunctiva normal ENT: Oral Mucosa clear moist. Neck: no JVD, no Abnormal Mass Or lumps Cardiovascular: S1 and S2 Present, no Murmur, Peripheral Pulses Present Respiratory: normal respiratory effort, Bilateral Air entry equal and Decreased, no use of accessory muscle, Clear to Auscultation, no Crackles, no wheezes Abdomen: Bowel Sound present, Soft and no tenderness, no  hernia Skin: no redness, no Rash, no induration Extremities: no Pedal edema, no calf tenderness Neurologic: Grossly no focal neuro deficit. Bilaterally Equal motor strength  Data Reviewed: CBC:  Recent Labs Lab 07/09/17 1352 07/10/17 0436 07/11/17 0637  WBC 7.4 5.8 5.9  NEUTROABS 6.3 4.0 4.0  HGB 9.7* 9.6* 10.0*  HCT 29.5* 28.5* 29.7*  MCV 90.8 91.3 90.3  PLT 86* 69* 83*   Basic Metabolic Panel:  Recent Labs Lab 07/09/17 1352 07/09/17 1659 07/10/17 0436 07/11/17 0637  NA 137  --  140 139  K 4.1  --  3.6 3.3*  CL 106  --  110 106  CO2 24  --  25 25  GLUCOSE 139*  --  106* 106*  BUN 21*  --  21* 15  CREATININE 1.33*  --  1.19* 1.07*  CALCIUM 9.4  --  8.7* 8.8*  MG  --  2.0  --  1.8    Liver Function Tests:  Recent Labs Lab 07/09/17 1352 07/11/17 0637  AST 43* 54*  ALT 32 50  ALKPHOS 39 46  BILITOT 1.0 1.1  PROT 8.3* 7.9  ALBUMIN 3.5 3.3*   No results for input(s): LIPASE, AMYLASE in the last 168 hours. No results for input(s): AMMONIA in the last 168 hours. Coagulation Profile:  Recent Labs Lab 07/11/17 0637  INR 1.04   Cardiac Enzymes:  Recent Labs Lab 07/09/17 1352 07/09/17 1659 07/09/17 2244 07/10/17 0436  TROPONINI 0.08* 0.08* 0.09* 0.09*   BNP (last 3 results) No results for input(s): PROBNP in the last 8760 hours. CBG:  Recent Labs Lab 07/10/17 1119 07/10/17 1642 07/10/17 2227 07/11/17 0503 07/11/17 0833  GLUCAP 144* 101* 103* 98 103*   Studies: US Carotid Bilateral  Result Date: 07/10/2017 CLINICAL DATA:  71 year old female with a history of syncope. Cardiovascular risk factors include hypertension, diabetes EXAM: BILATERAL CAROTID DUPLEX ULTRASOUND TECHNIQUE: Pearline Cables scale imaging, color Doppler and duplex ultrasound were performed of bilateral carotid and vertebral arteries in the neck. COMPARISON:  No prior duplex FINDINGS: Criteria: Quantification of carotid stenosis is based on velocity parameters that correlate the  residual internal carotid diameter with NASCET-based stenosis levels, using the diameter of the distal internal carotid lumen as the denominator for stenosis measurement. The following velocity measurements were obtained: RIGHT ICA:  Systolic 80 cm/sec, Diastolic 30 cm/sec CCA:  53 cm/sec SYSTOLIC ICA/CCA RATIO:  1.5 ECA:  54 cm/sec LEFT ICA:  Systolic 83 cm/sec, Diastolic 26 cm/sec CCA:  62 cm/sec SYSTOLIC ICA/CCA RATIO:  1.3 ECA:  61 cm/sec Right Brachial SBP: Not acquired Left Brachial SBP: Not acquired RIGHT CAROTID ARTERY: No significant calcified disease of the right common carotid artery. Intermediate waveform maintained. Mild plaque without significant calcifications at the right carotid bifurcation. Low resistance waveform of the right ICA. No significant tortuosity. RIGHT VERTEBRAL ARTERY: Antegrade flow with low resistance waveform. LEFT CAROTID ARTERY: No significant calcified disease of the left common carotid artery. Intermediate waveform maintained. Mild plaque at the left carotid bifurcation without significant calcifications. Low resistance waveform of  the left ICA. LEFT VERTEBRAL ARTERY:  Antegrade flow with low resistance waveform. IMPRESSION: Color duplex indicates minimal homogeneous plaque, with no hemodynamically significant stenosis by duplex criteria in the extracranial cerebrovascular circulation. Signed, Dulcy Fanny. Earleen Newport, DO [facility] Radiology Electronically Signed   By: Corrie Mckusick D.O.   On: 07/10/2017 10:16    Scheduled Meds: . ferrous sulfate  325 mg Oral Q breakfast  . levothyroxine  25 mcg Oral QAC breakfast  . potassium chloride  30 mEq Oral TID  . sodium chloride flush  3 mL Intravenous Q12H   Continuous Infusions: PRN Meds: diphenhydrAMINE **AND** acetaminophen, HYDROcodone-acetaminophen  Time spent: 35 minutes  Author: Reyne Dumas MD  [facility]   07/11/2017 8:58 AM  If 7PM-7AM, please contact  night-coverage at [email or web address], password [facility]

## 2017-07-12 DIAGNOSIS — E119 Type 2 diabetes mellitus without complications: Secondary | ICD-10-CM | POA: Diagnosis not present

## 2017-07-12 DIAGNOSIS — B9620 Unspecified Escherichia coli [E. coli] as the cause of diseases classified elsewhere: Secondary | ICD-10-CM | POA: Diagnosis not present

## 2017-07-12 DIAGNOSIS — R7989 Other specified abnormal findings of blood chemistry: Secondary | ICD-10-CM | POA: Diagnosis not present

## 2017-07-12 DIAGNOSIS — D696 Thrombocytopenia, unspecified: Secondary | ICD-10-CM | POA: Diagnosis not present

## 2017-07-12 DIAGNOSIS — R748 Abnormal levels of other serum enzymes: Secondary | ICD-10-CM | POA: Diagnosis not present

## 2017-07-12 DIAGNOSIS — N390 Urinary tract infection, site not specified: Secondary | ICD-10-CM | POA: Diagnosis not present

## 2017-07-12 DIAGNOSIS — S32010D Wedge compression fracture of first lumbar vertebra, subsequent encounter for fracture with routine healing: Secondary | ICD-10-CM | POA: Diagnosis not present

## 2017-07-12 DIAGNOSIS — E876 Hypokalemia: Secondary | ICD-10-CM | POA: Diagnosis not present

## 2017-07-12 DIAGNOSIS — R55 Syncope and collapse: Secondary | ICD-10-CM

## 2017-07-12 LAB — GLUCOSE, CAPILLARY
Glucose-Capillary: 84 mg/dL (ref 65–99)
Glucose-Capillary: 94 mg/dL (ref 65–99)

## 2017-07-12 NOTE — Progress Notes (Signed)
PROGRESS NOTE                                                                                                                                                                                                             Patient Demographics:    Amanda Wu, is a 71 y.o. female, DOB - 10/26/1946, HWE:[id number]  Admit date - 07/09/2017   Admitting Physician Reubin Milan, MD  Outpatient Primary MD for the patient is Vedia Coffer, MD  LOS - 0  Outpatient Specialists: Oncology  Chief Complaint  Patient presents with  . Loss of Consciousness       Brief Narrative   71 year old female with history of multiple myeloma in remission, on Revlimid, hypertension, CNS lymphoma, type 2 diabetes mellitus, partial nephrectomy, hypothyroidism who was admitted with syncope. She was found to have compression fracture of L1 vertebrae secondary to fall. Workup including 2-D echo and VQ scan have been unremarkable for cause for syncope.   Subjective:   Patient denies any dizziness, lightheadedness, chest pain or shortness of breath. Complains of pain in her low back.   Assessment  & Plan :    Principal Problem:   Syncope Suspect vasovagal versus orthostatic. No neurological deficit. Head CT unremarkable. She has been stable on telemetry. Orthostasis was negative on admission. Had mildly elevated troponin but no chest pain symptoms or EKG changes. 2-D echo with normal EF, no wall motion abnormality and grade 1 diastolic dysfunction. Carotid Doppler without significant stenosis. D-dimer was elevated so a VQ scan was done which showed low probability for PE. Doppler lower extremity negative for DVT. Empiric Lovenox discontinued. No further symptoms. Very low suspicion for seizures based on history.  Active Problems:   Lumbar compression fracture (HCC) Sustained lumbar compression fracture following syncope. Complains of pain in her low  back and has difficulty with transfer. In control with when necessary Vicodin. PT recommends SNF. Social work consulted.     Diabetes mellitus without complication ([place]) Metformin which is held. Continue sliding scale coverage    Elevated troponin Suspect demand ischemia. Troponin peaked at 0.09. Stable on telemetry, no EKG changes and normal echo.     Thrombocytopenia ([place]) Possibly secondary to Revlimid. Recent platelet count at [facility] was 149. No signs of bleeding Monitor closely.  Hypothyroidism   continue Synthroid   Acute kidney injury  Mild, possibly prerenal. Resolved with fluids.  Hypokalemia Replenished    Code Status : Full code  Family Communication  : Sister called and left a message  Disposition Plan  : SNF  Barriers For Discharge : Placement  Consults  :  None  Procedures  : None  DVT Prophylaxis  :  SCDs  Lab Results  Component Value Date   PLT 83 (L) 07/11/2017    Antibiotics  :   Anti-infectives    None        Objective:   Vitals:   07/11/17 1336 07/11/17 2154 07/12/17 0438 07/12/17 0500  BP: 127/73 122/68 118/79   Pulse: 89 84 82   Resp: '16 16 16   ' Temp: 98.2 F (36.8 C) 98.4 F (36.9 C) 98.2 F (36.8 C)   TempSrc: Oral Oral Oral   SpO2: 98% 97% 98%   Weight:    65.4 kg (144 lb 2.9 oz)  Height:        Wt Readings from Last 3 Encounters:  07/12/17 65.4 kg (144 lb 2.9 oz)  08/30/15 66.2 kg (146 lb)  08/26/15 66.2 kg (146 lb)     Intake/Output Summary (Last 24 hours) at 07/12/17 1245 Last data filed at 07/12/17 0439  Gross per 24 hour  Intake              240 ml  Output             1150 ml  Net             -910 ml     Physical Exam  Gen: not in distress HEENT: Pallor present, moist mucosa, supple neck Chest: clear b/l, no added sounds CVS: N S1&S2, no murmurs, GI: soft, NT, ND,  Musculoskeletal: warm, no edema, tenderness over lower back     Data Review:    CBC  Recent Labs Lab 07/09/17 1352  07/10/17 0436 07/11/17 0637  WBC 7.4 5.8 5.9  HGB 9.7* 9.6* 10.0*  HCT 29.5* 28.5* 29.7*  PLT 86* 69* 83*  MCV 90.8 91.3 90.3  MCH 29.8 30.8 30.4  MCHC 32.9 33.7 33.7  RDW 17.0* 17.1* 16.7*  LYMPHSABS 0.6* 1.1 0.9  MONOABS 0.5 0.4 0.5  EOSABS 0.1 0.3 0.4  BASOSABS 0.0 0.0 0.0    Chemistries   Recent Labs Lab 07/09/17 1352 07/09/17 1659 07/10/17 0436 07/11/17 0637  NA 137  --  140 139  K 4.1  --  3.6 3.3*  CL 106  --  110 106  CO2 24  --  25 25  GLUCOSE 139*  --  106* 106*  BUN 21*  --  21* 15  CREATININE 1.33*  --  1.19* 1.07*  CALCIUM 9.4  --  8.7* 8.8*  MG  --  2.0  --  1.8  AST 43*  --   --  54*  ALT 32  --   --  50  ALKPHOS 39  --   --  46  BILITOT 1.0  --   --  1.1   ------------------------------------------------------------------------------------------------------------------ No results for input(s): CHOL, HDL, LDLCALC, TRIG, CHOLHDL, LDLDIRECT in the last 72 hours.  No results found for: HGBA1C ------------------------------------------------------------------------------------------------------------------ No results for input(s): TSH, T4TOTAL, T3FREE, THYROIDAB in the last 72 hours.  Invalid input(s): FREET3 ------------------------------------------------------------------------------------------------------------------ No results for input(s): VITAMINB12, FOLATE, FERRITIN, TIBC, IRON, RETICCTPCT in the last 72 hours.  Coagulation profile  Recent Labs Lab 07/11/17 0637  INR 1.04     Recent Labs  07/10/17  1109  DDIMER 8.94*    Cardiac Enzymes  Recent Labs Lab 07/09/17 1659 07/09/17 2244 07/10/17 0436  TROPONINI 0.08* 0.09* 0.09*   ------------------------------------------------------------------------------------------------------------------ No results found for: BNP  Inpatient Medications  Scheduled Meds: . ferrous sulfate  325 mg Oral Q breakfast  . levothyroxine  25 mcg Oral QAC breakfast  . sodium chloride flush  3 mL  Intravenous Q12H   Continuous Infusions: PRN Meds:.diphenhydrAMINE **AND** acetaminophen, HYDROcodone-acetaminophen  Micro Results Recent Results (from the past 240 hour(s))  Culture, Urine     Status: Abnormal (Preliminary result)   Collection Time: 07/10/17  4:52 AM  Result Value Ref Range Status   Specimen Description URINE, CLEAN CATCH  Final   Special Requests NONE  Final   Culture >=100,000 COLONIES/mL ESCHERICHIA COLI (A)  Final   Report Status PENDING  Incomplete    Radiology Reports Dg Lumbar Spine Complete  Result Date: 07/09/2017 CLINICAL DATA:  Pain of the pelvis and lower back extending into bilateral hips. EXAM: LUMBAR SPINE - COMPLETE 4+ VIEW COMPARISON:  Chest x-ray January 19, 2011 FINDINGS: There is 50% compression deformity of L1, new since prior chest x-ray of February 2012. The chronicity is indeterminate. If the patient has focal pain here, acute fracture is not excluded. Degenerative joint changes at L5-S1 are noted. There is scoliosis of spine. IMPRESSION: There is 50% compression deformity of L1, new since prior chest x-ray of February 2012. The chronicity is indeterminate. If the patient has focal pain here, acute fracture is not excluded. Electronically Signed   By: Abelardo Diesel M.D.   On: 07/09/2017 15:08   Ct Head Wo Contrast  Result Date: 07/09/2017 CLINICAL DATA:  Syncopal episode yesterday EXAM: CT HEAD WITHOUT CONTRAST TECHNIQUE: Contiguous axial images were obtained from the base of the skull through the vertex without intravenous contrast. COMPARISON:  08/26/2015 FINDINGS: Brain: Bifrontal encephalomalacia changes are again seen and stable. No findings to suggest acute hemorrhage, acute infarction or space-occupying mass lesion are noted. Small calcification is noted just to the left of the sella turcica likely related to a small meningioma. It is stable in appearance. Vascular: No hyperdense vessel or unexpected calcification. Skull: Postsurgical changes  are seen. A lucent lesion is noted within the right parietal bone stable from the prior exam Sinuses/Orbits: No acute finding. Other: Mild scalp hematoma is noted in the left parietal region likely related to recent injury. IMPRESSION: Chronic changes similar to that noted on the prior exam. No acute intracranial abnormality is noted. Small scalp hematoma is noted on the left. Electronically Signed   By: [name] M.D.   On: 07/09/2017 14:51   US Carotid Bilateral  Result Date: 07/10/2017 CLINICAL DATA:  71 year old female with a history of syncope. Cardiovascular risk factors include hypertension, diabetes EXAM: BILATERAL CAROTID DUPLEX ULTRASOUND TECHNIQUE: Pearline Cables scale imaging, color Doppler and duplex ultrasound were performed of bilateral carotid and vertebral arteries in the neck. COMPARISON:  No prior duplex FINDINGS: Criteria: Quantification of carotid stenosis is based on velocity parameters that correlate the residual internal carotid diameter with NASCET-based stenosis levels, using the diameter of the distal internal carotid lumen as the denominator for stenosis measurement. The following velocity measurements were obtained: RIGHT ICA:  Systolic 80 cm/sec, Diastolic 30 cm/sec CCA:  53 cm/sec SYSTOLIC ICA/CCA RATIO:  1.5 ECA:  54 cm/sec LEFT ICA:  Systolic 83 cm/sec, Diastolic 26 cm/sec CCA:  62 cm/sec SYSTOLIC ICA/CCA RATIO:  1.3 ECA:  61 cm/sec Right Brachial SBP: Not acquired Left Brachial  SBP: Not acquired RIGHT CAROTID ARTERY: No significant calcified disease of the right common carotid artery. Intermediate waveform maintained. Mild plaque without significant calcifications at the right carotid bifurcation. Low resistance waveform of the right ICA. No significant tortuosity. RIGHT VERTEBRAL ARTERY: Antegrade flow with low resistance waveform. LEFT CAROTID ARTERY: No significant calcified disease of the left common carotid artery. Intermediate waveform maintained. Mild plaque at the left carotid  bifurcation without significant calcifications. Low resistance waveform of the left ICA. LEFT VERTEBRAL ARTERY:  Antegrade flow with low resistance waveform. IMPRESSION: Color duplex indicates minimal homogeneous plaque, with no hemodynamically significant stenosis by duplex criteria in the extracranial cerebrovascular circulation. Signed, Dulcy Fanny. Earleen Newport, DO [facility] Electronically Signed   By: Corrie Mckusick D.O.   On: 07/10/2017 10:16   Nm Pulmonary Perf And Vent  Result Date: 07/11/2017 CLINICAL DATA:  Shortness of breath. EXAM: NUCLEAR MEDICINE VENTILATION - PERFUSION LUNG SCAN TECHNIQUE: Ventilation images were obtained in multiple projections using inhaled aerosol Tc-13mDTPA. Perfusion images were obtained in multiple projections after intravenous injection of Tc-969mAA. RADIOPHARMACEUTICALS:  Thirty mCi Technetium-9959mPA aerosol inhalation and 4.1 mCi Technetium-68m61m IV COMPARISON:  None. FINDINGS: Ventilation: There is a small ventilation defect identified along the left lung base. Perfusion: No unmatched wedge shaped peripheral perfusion defects to suggest acute pulmonary embolism. Small perfusion defect is identified along the left base which corresponds with the ventilation abnormality. IMPRESSION: Very low probability for acute pulmonary embolus. Electronically Signed   By: TaylKerby Moors.   On: 07/11/2017 14:39   Us VK[place]ous Img Lower Bilateral  Result Date: 07/11/2017 CLINICAL DATA:  Bilateral lower extremity edema, elevated D-dimer EXAM: BILATERAL LOWER EXTREMITY VENOUS DOPPLER ULTRASOUND TECHNIQUE: Gray-scale sonography with graded compression, as well as color Doppler and duplex ultrasound were performed to evaluate the lower extremity deep venous systems from the level of the common femoral vein and including the common femoral, femoral, profunda femoral, popliteal and calf veins including the posterior tibial, peroneal  and gastrocnemius veins when visible. The superficial great saphenous vein was also interrogated. Spectral Doppler was utilized to evaluate flow at rest and with distal augmentation maneuvers in the common femoral, femoral and popliteal veins. COMPARISON:  None. FINDINGS: RIGHT LOWER EXTREMITY Common Femoral Vein: No evidence of thrombus. Normal compressibility, respiratory phasicity and response to augmentation. Saphenofemoral Junction: No evidence of thrombus. Normal compressibility and flow on color Doppler imaging. Profunda Femoral Vein: No evidence of thrombus. Normal compressibility and flow on color Doppler imaging. Femoral Vein: No evidence of thrombus. Normal compressibility, respiratory phasicity and response to augmentation. Popliteal Vein: No evidence of thrombus. Normal compressibility, respiratory phasicity and response to augmentation. Calf Veins: No evidence of thrombus. Normal compressibility and flow on color Doppler imaging. Superficial Great Saphenous Vein: No evidence of thrombus. Normal compressibility and flow on color Doppler imaging. Venous Reflux:  None. Other Findings:  None. LEFT LOWER EXTREMITY Common Femoral Vein: No evidence of thrombus. Normal compressibility, respiratory phasicity and response to augmentation. Saphenofemoral Junction: No evidence of thrombus. Normal compressibility and flow on color Doppler imaging. Profunda Femoral Vein: No evidence of thrombus. Normal compressibility and flow on color Doppler imaging. Femoral Vein: No evidence of thrombus. Normal compressibility, respiratory phasicity and response to augmentation. Popliteal Vein: No evidence of thrombus. Normal compressibility, respiratory phasicity and response to augmentation. Calf Veins: No evidence of thrombus. Normal compressibility and flow on color Doppler imaging. Superficial Great Saphenous Vein: No evidence of thrombus. Normal compressibility and flow  on color Doppler imaging. Venous Reflux:  None. Other  Findings:  None. IMPRESSION: No evidence of DVT within either lower extremity. Electronically Signed   By: Jerilynn Mages.  Shick M.D.   On: 07/11/2017 13:47   Dg Chest Port 1 View  Result Date: 07/11/2017 CLINICAL DATA:  Patient with syncopal episode.  Hypertension. EXAM: PORTABLE CHEST 1 VIEW COMPARISON:  Chest radiograph 01/19/2011 FINDINGS: Monitoring leads overlie the patient. Stable cardiac and mediastinal contours. Aortic tortuosity. Low lung volumes. Bibasilar heterogeneous pulmonary opacities. No pleural effusion or pneumothorax. IMPRESSION: Markedly low lung volumes. Basilar opacities favored to represent atelectasis. Consider further evaluation with PA and lateral chest radiograph. Electronically Signed   By: Lovey Newcomer M.D.   On: 07/11/2017 12:34    Time Spent in minutes  25   Louellen Molder M.D on 07/12/2017 at 12:45 PM  Between 7am to 7pm - Pager - [phone]  After 7pm go to [email or web address] - password [facility]  [facility] -  Office  [phone]

## 2017-07-12 NOTE — Evaluation (Signed)
Physical Therapy Evaluation Patient Details Name: Amanda Wu MRN: [id number] DOB: November 18, 1946 Today's Date: 07/12/2017   History of Present Illness  Per patient, yesterday morning while at home she got up just after she finished it in her oatmeal for breakfast. She mentions that upon arising she developed dizziness, became nauseous, diaphoretic, having 1 episode of emesis and vomiting the contents of her breakfast, then subsequently passing out. The patient mentions that after she woke up she noticed that she was having back pain, which was new. She denies chest pain, dyspnea, palpitations, recent PND, orthopnea or pitting edema lower extremities. She denies abdominal pain, diarrhea, constipation, melena or hematochezia. She denies dysuria, frequency or hematuria. She denies polyuria, polydipsia or blurred vision.  Clinical Impression  Patient received in bed, pleasant and willing to work with PT however reporting her back is bothering her quite a bit. Patient able to follow PT instructions/commands to remove external catheter on her own. She required Mod assist for correct and safe form for rolling and supine to sit in face of compression fracture identified on imaging during this hospital stay. Attempted sit to stand from bed with Mod assist and elevated surface, however patient unable to complete full transfer due to pain limitations. Min assist for return to supine with correct sequencing. Patient left sidelying in bed with bed alarm activated and all needs met/concerns addressed; PT asked nurse tech to replace external catheter. Based on findings of this evaluation, patient would likely benefit from SNF placement moving forward.     Follow Up Recommendations SNF    Equipment Recommendations  None recommended by PT    Recommendations for Other Services       Precautions / Restrictions Precautions Precautions: Fall;Other (comment) Precaution Comments: vertebral compression fracture (unsure  if chronic or acute) Restrictions Weight Bearing Restrictions: No      Mobility  Bed Mobility Overal bed mobility: Needs Assistance Bed Mobility: Rolling;Supine to Sit;Sit to Supine Rolling: Mod assist   Supine to sit: Mod assist Sit to supine: Min assist   General bed mobility comments: for correct sequencing and safety of transfer given L1 compression fracture. limited by pain   Transfers Overall transfer level: Needs assistance   Transfers: Sit to/from Stand Sit to Stand: Mod assist;From elevated surface         General transfer comment: partial stand with Mod assist x3 and bed elevated, limited by pain   Ambulation/Gait             General Gait Details: DNT today   Stairs            Wheelchair Mobility    Modified Rankin (Stroke Patients Only)       Balance Overall balance assessment: History of Falls;Needs assistance Sitting-balance support: Bilateral upper extremity supported Sitting balance-Leahy Scale: Good   Postural control: Posterior lean (mild ) Standing balance support: Bilateral upper extremity supported Standing balance-Leahy Scale: Fair                               Pertinent Vitals/Pain Pain Assessment: 0-10 Pain Score: 9  Pain Location: low back  Pain Descriptors / Indicators: Aching Pain Intervention(s): Limited activity within patient's tolerance;Monitored during session;Repositioned    Home Living Family/patient expects to be discharged to:: Private residence Living Arrangements: Other relatives Available Help at Discharge: Family Type of Home: House Home Access: Level entry     Home Layout: One level Home Equipment: Grab bars -  tub/shower Additional Comments: patient statse she was fairly independent before she was admitted to hospital     Prior Function Level of Independence: Independent         Comments: per pateint, family not present to confirm      Hand Dominance         Extremity/Trunk Assessment        Lower Extremity Assessment Lower Extremity Assessment: Generalized weakness    Cervical / Trunk Assessment Cervical / Trunk Assessment: Kyphotic  Communication   Communication: No difficulties  Cognition Arousal/Alertness: Awake/alert Behavior During Therapy: WFL for tasks assessed/performed Overall Cognitive Status: No family/caregiver present to determine baseline cognitive functioning                                        General Comments      Exercises     Assessment/Plan    PT Assessment Patient needs continued PT services  PT Problem List Decreased strength;Decreased mobility;Decreased coordination;Decreased activity tolerance;Decreased balance;Pain       PT Treatment Interventions Therapeutic activities;Gait training;Therapeutic exercise;Patient/family education;Balance training;Functional mobility training;Neuromuscular re-education;Manual techniques    PT Goals (Current goals can be found in the Care Plan section)  Acute Rehab PT Goals Patient Stated Goal: to go home  PT Goal Formulation: With patient Time For Goal Achievement: 07/26/17 Potential to Achieve Goals: Fair    Frequency Min 3X/week   Barriers to discharge        Co-evaluation               AM-PAC PT "6 Clicks" Daily Activity  Outcome Measure Difficulty turning over in bed (including adjusting bedclothes, sheets and blankets)?: A Lot Difficulty moving from lying on back to sitting on the side of the bed? : A Lot Difficulty sitting down on and standing up from a chair with arms (e.g., wheelchair, bedside commode, etc,.)?: A Lot Help needed moving to and from a bed to chair (including a wheelchair)?: A Lot Help needed walking in hospital room?: Total Help needed climbing 3-5 steps with a railing? : Total 6 Click Score: 10    End of Session Equipment Utilized During Treatment: Gait belt Activity Tolerance: Patient tolerated  treatment well;Patient limited by pain Patient left: in bed;with call bell/phone within reach;with bed alarm set Nurse Communication: Other (comment) (asked nurse tech to replace external catheter, which was removed for PT eval ) PT Visit Diagnosis: Unsteadiness on feet (R26.81);Muscle weakness (generalized) (M62.81);History of falling (Z91.81);Difficulty in walking, not elsewhere classified (R26.2);Pain    Time: 9038-3338 PT Time Calculation (min) (ACUTE ONLY): 24 min   Charges:   PT Evaluation $PT Eval Low Complexity: 1 Procedure PT Treatments $Self Care/Home Management: 8-22   PT G Codes:   PT G-Codes **NOT FOR INPATIENT CLASS** Functional Assessment Tool Used: AM-PAC 6 Clicks Basic Mobility;Clinical judgement Functional Limitation: Mobility: Walking and moving around Mobility: Walking and Moving Around Current Status (V2919): At least 40 percent but less than 60 percent impaired, limited or restricted Mobility: Walking and Moving Around Goal Status [phone]): At least 20 percent but less than 40 percent impaired, limited or restricted    Deniece Ree PT, DPT [phone]

## 2017-07-12 NOTE — Progress Notes (Signed)
Per Dr. Clementeen Graham, okay to leave pt off tele with no IV access while she awaits SNF placement.

## 2017-07-13 ENCOUNTER — Inpatient Hospital Stay
Admission: RE | Admit: 2017-07-13 | Discharge: 2017-08-03 | Disposition: A | Discharge status: Home / Self Care | Payer: Medicare PPO | Source: Ambulatory Visit | Attending: Internal Medicine | Admitting: Internal Medicine

## 2017-07-13 DIAGNOSIS — N390 Urinary tract infection, site not specified: Secondary | ICD-10-CM | POA: Diagnosis not present

## 2017-07-13 DIAGNOSIS — R748 Abnormal levels of other serum enzymes: Secondary | ICD-10-CM | POA: Diagnosis not present

## 2017-07-13 DIAGNOSIS — R7989 Other specified abnormal findings of blood chemistry: Secondary | ICD-10-CM

## 2017-07-13 DIAGNOSIS — R55 Syncope and collapse: Secondary | ICD-10-CM | POA: Diagnosis not present

## 2017-07-13 DIAGNOSIS — B9620 Unspecified Escherichia coli [E. coli] as the cause of diseases classified elsewhere: Secondary | ICD-10-CM

## 2017-07-13 DIAGNOSIS — S32010D Wedge compression fracture of first lumbar vertebra, subsequent encounter for fracture with routine healing: Secondary | ICD-10-CM | POA: Diagnosis not present

## 2017-07-13 LAB — CBC
HCT: 27.7 % — ABNORMAL LOW (ref 36.0–46.0)
Hemoglobin: 9.1 g/dL — ABNORMAL LOW (ref 12.0–15.0)
MCH: 29.8 pg (ref 26.0–34.0)
MCHC: 32.9 g/dL (ref 30.0–36.0)
MCV: 90.8 fL (ref 78.0–100.0)
PLATELETS: 80 10*3/uL — AB (ref 150–400)
RBC: 3.05 MIL/uL — ABNORMAL LOW (ref 3.87–5.11)
RDW: 17.3 % — AB (ref 11.5–15.5)
WBC: 5 10*3/uL (ref 4.0–10.5)

## 2017-07-13 LAB — URINE CULTURE: Culture: >=100000 — AB

## 2017-07-13 LAB — GLUCOSE, CAPILLARY
GLUCOSE-CAPILLARY: 91 mg/dL (ref 65–99)
Glucose-Capillary: 118 mg/dL — ABNORMAL HIGH (ref 65–99)

## 2017-07-13 MED ORDER — HYDROCODONE-ACETAMINOPHEN 5-325 MG PO TABS: 1.0000 | ORAL_TABLET | ORAL | 0 refills | Status: DC | PRN

## 2017-07-13 MED ORDER — CEPHALEXIN 500 MG PO CAPS: 500.0000 mg | ORAL_CAPSULE | Freq: Two times a day (BID) | ORAL | 0 refills | Status: AC

## 2017-07-13 NOTE — Progress Notes (Signed)
Patient is medically stable for discharge Patient accepted to [facility] for admission today Patient alone in room, seen and finalized plans/in agreement to SNF  Patient will transport by hospital staff and 2 sisters are on there way to hospital to bring clothing (annie and Benjamine Mola)  No other needs at this time. Leadership approved 5 day LOG for placement and assistance with insurance auth.  Patient to discharge after lunch. RN made aware of plan.  CSW signed off. Lane Hacker, MSW Clinical Social Work: [employer] Coverage for :  [phone]

## 2017-07-13 NOTE — Progress Notes (Signed)
Report given to Kerin Salen at [facility].

## 2017-07-13 NOTE — Clinical Social Work Placement (Addendum)
   CLINICAL SOCIAL WORK PLACEMENT  NOTE  Date:  07/13/2017  Patient Details  Name: Amanda Wu MRN: [id number] Date of Birth: Feb 10, 1946  Clinical Social Work is seeking post-discharge placement for this patient at the [place] level of care (*CSW will initial, date and re-position this form in  chart as items are completed):  Yes   Patient/family provided with [place] Work Department's list of facilities offering this level of care within the geographic area requested by the patient (or if unable, by the patient's family).  Yes   Patient/family informed of their freedom to choose among providers that offer the needed level of care, that participate in Medicare, Medicaid or managed care program needed by the patient, have an available bed and are willing to accept the patient.      Patient/family informed of [facility]'s ownership interest in [facility] and [facility], as well as of the fact that they are under no obligation to receive care at these facilities.  PASRR submitted to EDS on 07/13/17     PASRR number received on 07/13/17     Existing PASRR number confirmed on       FL2 transmitted to all facilities in geographic area requested by pt/family on 07/13/17     FL2 transmitted to all facilities within larger geographic area on       Patient informed that his/her managed care company has contracts with or will negotiate with certain facilities, including the following:             Patient/family informed of bed offers received.  07/13/2017   Patient chooses bed at     [facility]  Physician recommends and patient chooses bed at     [facility] Patient to be transferred to   on  .  [place]  Patient to be transferred to facility by      Hospital staff  Patient family notified on   of transfer.  Sister/family at bedside  Name of family member notified:        PHYSICIAN Please sign FL2     Additional Comment:     _______________________________________________ [name], LCSW 07/13/2017, 10:10 AM

## 2017-07-13 NOTE — [place] FL2 (Signed)
  [place] MEDICAID FL2 LEVEL OF CARE SCREENING TOOL     IDENTIFICATION  Patient Name: Amanda Wu Birthdate: 24-Jul-1946 Sex: female Admission Date (Current Location): 07/09/2017  [facility] and [place] Number:  [employer] and Address:  [facility] [address], [place]      Provider Number: [id number]  Attending Physician Name and Address:  Louellen Molder, MD  Relative Name and Phone Number:       Current Level of Care: Hospital Recommended Level of Care: [place] Prior Approval Number:    Date Approved/Denied:   PASRR Number:   [id number]  Discharge Plan: SNF    Current Diagnoses: Patient Active Problem List   Diagnosis Date Noted  . Syncope 07/09/2017  . Multiple myeloma ([place]) 07/09/2017  . Benign hypertension 07/09/2017  . Hypothyroidism 07/09/2017  . Diabetes mellitus without complication ([place]) 62/13/0865  . Elevated troponin 07/09/2017  . Lumbar compression fracture ([place]) 07/09/2017  . Thrombocytopenia ([place]) 07/09/2017    Orientation RESPIRATION BLADDER Height & Weight     Self, Time, Situation, Place  Normal Continent Weight: 144 lb 2.9 oz (65.4 kg) Height:  5' 9" (175.3 cm)  BEHAVIORAL SYMPTOMS/MOOD NEUROLOGICAL BOWEL NUTRITION STATUS      Continent Diet (regular/see DC summary)  AMBULATORY STATUS COMMUNICATION OF NEEDS Skin   Limited Assistance (unknow PT did not attempt on evaluation) Verbally Normal                       Personal Care Assistance Level of Assistance  Bathing, Feeding, Dressing Bathing Assistance: Limited assistance Feeding assistance: Independent Dressing Assistance: Limited assistance     Functional Limitations Info  Sight, Hearing, Speech Sight Info: Adequate Hearing Info: Adequate Speech Info: Adequate    SPECIAL CARE FACTORS FREQUENCY  PT (By licensed PT), OT (By licensed OT)     PT Frequency: 5x OT Frequency: 5x            Contractures  Contractures Info: Not present    Additional Factors Info  Code Status, Allergies Code Status Info: Full Code  Allergies Info: Lotensin Benazepril Hcl           Current Medications (07/13/2017):  This is the current hospital active medication list Current Facility-Administered Medications  Medication Dose Route Frequency Provider Last Rate Last Dose  . diphenhydrAMINE (BENADRYL) capsule 25 mg  25 mg Oral QHS PRN Reubin Milan, MD       And  . acetaminophen (TYLENOL) tablet 500 mg  500 mg Oral QHS PRN Reubin Milan, MD      . ferrous sulfate tablet 325 mg  325 mg Oral Q breakfast Reubin Milan, MD   325 mg at 07/13/17 [phone]  . HYDROcodone-acetaminophen (NORCO/VICODIN) 5-325 MG per tablet 1 tablet  1 tablet Oral Q4H PRN Reubin Milan, MD   1 tablet at 07/13/17 0901  . levothyroxine (SYNTHROID, LEVOTHROID) tablet 25 mcg  25 mcg Oral QAC breakfast Reubin Milan, MD   25 mcg at 07/13/17 [phone]  . sodium chloride flush (NS) 0.9 % injection 3 mL  3 mL Intravenous Q12H Reubin Milan, MD   3 mL at 07/11/17 2025     Discharge Medications: Please see discharge summary for a list of discharge medications.  Relevant Imaging Results:  Relevant Lab Results:   Additional Information SSN:  [id number]  [name], [place]

## 2017-07-13 NOTE — Discharge Summary (Signed)
Physician Discharge Summary  Amanda Wu ELF:[id number] DOB: [birth year] DOA: 07/09/2017  PCP: Vedia Coffer, MD  Admit date: 07/09/2017 Discharge date: 07/13/2017  Admitted From: Home Disposition:  Skilled nursing facility  Recommendations for Outpatient Follow-up:  1. Follow-up with M.D. at SNF in 1 week. Patient will complete 5 day course of antibiotics for UTI on 8/4.   Equipment/Devices:As per therapy at the nursing facility  Discharge Condition: fair CODE STATUS: Full code Diet recommendation: carb modified    Discharge Diagnoses:  Principal Problem:   Syncope  Active Problems:     E-coli UTI   Lumbar compression fracture (HCC)   Multiple myeloma (HCC)   Benign hypertension   Hypothyroidism   Diabetes mellitus without complication (HCC)   Elevated troponin   Thrombocytopenia (HCC)   Brief narrative/history of present illness 71 year old female with history of multiple myeloma in remission, on Revlimid, hypertension, CNS lymphoma, type 2 diabetes mellitus, partial nephrectomy, hypothyroidism who was admitted with syncope. She was found to have compression fracture of L1 vertebrae secondary to fall. Workup including 2-D echo and VQ scan have been unremarkable for cause for syncope.  Principal Problem:   Syncope Suspect vasovagal versus orthostatic versus due to UTI. No neurological deficit. Head CT unremarkable. Remains stable on telemetry. Orthostasis was negative on admission. Had mildly elevated troponin but no chest pain symptoms or EKG changes. 2-D echo with normal EF, no wall motion abnormality and grade 1 diastolic dysfunction. Carotid Doppler without significant stenosis. D-dimer was elevated so a VQ scan was done which showed low probability for PE. Doppler lower extremity negative for DVT. Empiric Lovenox discontinued. No further symptoms. Very low suspicion for seizures based on history.  Active Problems:   Lumbar compression fracture (HCC) Sustained  lumbar compression fracture following syncope. Pain control with when necessary Vicodin. PT recommends SNF since he has significant difficulty in transferring due to pain.   Escherichia coli UTI Denies dysuria. May have contributed to her syncopal episode. Culture pansensitive. I will treat her with a 5 day course of Keflex.    Diabetes mellitus without complication (HCC) Resume metformin. CBG stable    Elevated troponin Suspect demand ischemia. Troponin peaked at 0.09. Stable on telemetry, no EKG changes and normal echo.  Essential hypertension Stable. Continue home medications.    Thrombocytopenia ([place]) Possibly secondary to Revlimid. Recent platelet count at [facility] was 149. No signs of bleeding Monitor closely.  Hypothyroidism   continue Synthroid   Acute kidney injury Mild, possibly prerenal. Resolved with fluids.  Hypokalemia Replenished  Iron deficiency anemia Continue supplement   Family Communication  :  discussed with sister at bedside  Disposition Plan  : SNF    Consults  :  None  Procedures  :  Doppler lower extremity VQ scan 2-D echo  Carotid Doppler CT head      Discharge Instructions   Allergies as of 07/13/2017      Reactions   Lotensin [benazepril Hcl]    Facial swelling      Medication List    STOP taking these medications   ibuprofen 800 MG tablet Commonly known as:  ADVIL,MOTRIN     TAKE these medications   acetaminophen 500 MG tablet Commonly known as:  TYLENOL Take 1,000 mg by mouth every 6 (six) hours as needed for mild pain.   amLODipine 5 MG tablet Commonly known as:  NORVASC Take 5 mg by mouth daily.   CENTRUM ADULTS PO Take 1 tablet by mouth daily.   cephALEXin 500  MG capsule Commonly known as:  KEFLEX Take 1 capsule (500 mg total) by mouth 2 (two) times daily.   dexamethasone 4 MG tablet Commonly known as:  DECADRON Take 4 mg by mouth. Take 5 tablets every Monday when you have chemo  therapy.   diphenhydramine-acetaminophen 25-500 MG Tabs tablet Commonly known as:  TYLENOL PM Take 1 tablet by mouth at bedtime as needed (sleep/pain).   ferrous sulfate 325 (65 FE) MG tablet Take 325 mg by mouth daily with breakfast.   HYDROcodone-acetaminophen 5-325 MG tablet Commonly known as:  NORCO/VICODIN Take 1 tablet by mouth every 4 (four) hours as needed. What changed:  how much to take   levothyroxine 25 MCG tablet Commonly known as:  SYNTHROID, LEVOTHROID Take 25 mcg by mouth daily before breakfast.   loperamide 2 MG capsule Commonly known as:  IMODIUM Take 2 mg by mouth as needed for diarrhea or loose stools.   losartan 100 MG tablet Commonly known as:  COZAAR Take 100 mg by mouth daily.   metFORMIN 500 MG tablet Commonly known as:  GLUCOPHAGE Take 250 mg by mouth every other day.   potassium chloride 10 MEQ tablet Commonly known as:  K-DUR Take 30 mEq by mouth daily.   REVLIMID 15 MG capsule Generic drug:  lenalidomide Take 15 mg by mouth daily. For two weeks then off for three weeks. Then follow up doctor      Contact information for after-discharge care    [facility] Follow up.   Specialty:  Skilled Nursing Facility Contact information: [address] [place] [address] [phone]             Allergies  Allergen Reactions  . Lotensin [Benazepril Hcl]     Facial swelling      Procedures/Studies: Dg Lumbar Spine Complete  Result Date: 07/09/2017 CLINICAL DATA:  Pain of the pelvis and lower back extending into bilateral hips. EXAM: LUMBAR SPINE - COMPLETE 4+ VIEW COMPARISON:  Chest x-ray January 19, 2011 FINDINGS: There is 50% compression deformity of L1, new since prior chest x-ray of February 2012. The chronicity is indeterminate. If the patient has focal pain here, acute fracture is not excluded. Degenerative joint changes at L5-S1 are noted. There is scoliosis of spine. IMPRESSION:  There is 50% compression deformity of L1, new since prior chest x-ray of February 2012. The chronicity is indeterminate. If the patient has focal pain here, acute fracture is not excluded. Electronically Signed   By: Abelardo Diesel M.D.   On: 07/09/2017 15:08   Ct Head Wo Contrast  Result Date: 07/09/2017 CLINICAL DATA:  Syncopal episode yesterday EXAM: CT HEAD WITHOUT CONTRAST TECHNIQUE: Contiguous axial images were obtained from the base of the skull through the vertex without intravenous contrast. COMPARISON:  08/26/2015 FINDINGS: Brain: Bifrontal encephalomalacia changes are again seen and stable. No findings to suggest acute hemorrhage, acute infarction or space-occupying mass lesion are noted. Small calcification is noted just to the left of the sella turcica likely related to a small meningioma. It is stable in appearance. Vascular: No hyperdense vessel or unexpected calcification. Skull: Postsurgical changes are seen. A lucent lesion is noted within the right parietal bone stable from the prior exam Sinuses/Orbits: No acute finding. Other: Mild scalp hematoma is noted in the left parietal region likely related to recent injury. IMPRESSION: Chronic changes similar to that noted on the prior exam. No acute intracranial abnormality is noted. Small scalp hematoma is noted on  the left. Electronically Signed   By: [name] M.D.   On: 07/09/2017 14:51   US Carotid Bilateral  Result Date: 07/10/2017 CLINICAL DATA:  71 year old female with a history of syncope. Cardiovascular risk factors include hypertension, diabetes EXAM: BILATERAL CAROTID DUPLEX ULTRASOUND TECHNIQUE: Pearline Cables scale imaging, color Doppler and duplex ultrasound were performed of bilateral carotid and vertebral arteries in the neck. COMPARISON:  No prior duplex FINDINGS: Criteria: Quantification of carotid stenosis is based on velocity parameters that correlate the residual internal carotid diameter with NASCET-based stenosis levels, using  the diameter of the distal internal carotid lumen as the denominator for stenosis measurement. The following velocity measurements were obtained: RIGHT ICA:  Systolic 80 cm/sec, Diastolic 30 cm/sec CCA:  53 cm/sec SYSTOLIC ICA/CCA RATIO:  1.5 ECA:  54 cm/sec LEFT ICA:  Systolic 83 cm/sec, Diastolic 26 cm/sec CCA:  62 cm/sec SYSTOLIC ICA/CCA RATIO:  1.3 ECA:  61 cm/sec Right Brachial SBP: Not acquired Left Brachial SBP: Not acquired RIGHT CAROTID ARTERY: No significant calcified disease of the right common carotid artery. Intermediate waveform maintained. Mild plaque without significant calcifications at the right carotid bifurcation. Low resistance waveform of the right ICA. No significant tortuosity. RIGHT VERTEBRAL ARTERY: Antegrade flow with low resistance waveform. LEFT CAROTID ARTERY: No significant calcified disease of the left common carotid artery. Intermediate waveform maintained. Mild plaque at the left carotid bifurcation without significant calcifications. Low resistance waveform of the left ICA. LEFT VERTEBRAL ARTERY:  Antegrade flow with low resistance waveform. IMPRESSION: Color duplex indicates minimal homogeneous plaque, with no hemodynamically significant stenosis by duplex criteria in the extracranial cerebrovascular circulation. Signed, Dulcy Fanny. Earleen Newport, DO [facility] Electronically Signed   By: Corrie Mckusick D.O.   On: 07/10/2017 10:16   Nm Pulmonary Perf And Vent  Result Date: 07/11/2017 CLINICAL DATA:  Shortness of breath. EXAM: NUCLEAR MEDICINE VENTILATION - PERFUSION LUNG SCAN TECHNIQUE: Ventilation images were obtained in multiple projections using inhaled aerosol Tc-45mDTPA. Perfusion images were obtained in multiple projections after intravenous injection of Tc-937mAA. RADIOPHARMACEUTICALS:  Thirty mCi Technetium-9923mPA aerosol inhalation and 4.1 mCi Technetium-76m55m IV COMPARISON:  None. FINDINGS: Ventilation: There is  a small ventilation defect identified along the left lung base. Perfusion: No unmatched wedge shaped peripheral perfusion defects to suggest acute pulmonary embolism. Small perfusion defect is identified along the left base which corresponds with the ventilation abnormality. IMPRESSION: Very low probability for acute pulmonary embolus. Electronically Signed   By: TaylKerby Moors.   On: 07/11/2017 14:39   Us VKoreaous Img Lower Bilateral  Result Date: 07/11/2017 CLINICAL DATA:  Bilateral lower extremity edema, elevated D-dimer EXAM: BILATERAL LOWER EXTREMITY VENOUS DOPPLER ULTRASOUND TECHNIQUE: Gray-scale sonography with graded compression, as well as color Doppler and duplex ultrasound were performed to evaluate the lower extremity deep venous systems from the level of the common femoral vein and including the common femoral, femoral, profunda femoral, popliteal and calf veins including the posterior tibial, peroneal and gastrocnemius veins when visible. The superficial great saphenous vein was also interrogated. Spectral Doppler was utilized to evaluate flow at rest and with distal augmentation maneuvers in the common femoral, femoral and popliteal veins. COMPARISON:  None. FINDINGS: RIGHT LOWER EXTREMITY Common Femoral Vein: No evidence of thrombus. Normal compressibility, respiratory phasicity and response to augmentation. Saphenofemoral Junction: No evidence of thrombus. Normal compressibility and flow on color Doppler imaging. Profunda Femoral Vein: No evidence of thrombus. Normal compressibility and flow on color Doppler imaging. Femoral Vein:  No evidence of thrombus. Normal compressibility, respiratory phasicity and response to augmentation. Popliteal Vein: No evidence of thrombus. Normal compressibility, respiratory phasicity and response to augmentation. Calf Veins: No evidence of thrombus. Normal compressibility and flow on color Doppler imaging. Superficial Great Saphenous Vein: No evidence of  thrombus. Normal compressibility and flow on color Doppler imaging. Venous Reflux:  None. Other Findings:  None. LEFT LOWER EXTREMITY Common Femoral Vein: No evidence of thrombus. Normal compressibility, respiratory phasicity and response to augmentation. Saphenofemoral Junction: No evidence of thrombus. Normal compressibility and flow on color Doppler imaging. Profunda Femoral Vein: No evidence of thrombus. Normal compressibility and flow on color Doppler imaging. Femoral Vein: No evidence of thrombus. Normal compressibility, respiratory phasicity and response to augmentation. Popliteal Vein: No evidence of thrombus. Normal compressibility, respiratory phasicity and response to augmentation. Calf Veins: No evidence of thrombus. Normal compressibility and flow on color Doppler imaging. Superficial Great Saphenous Vein: No evidence of thrombus. Normal compressibility and flow on color Doppler imaging. Venous Reflux:  None. Other Findings:  None. IMPRESSION: No evidence of DVT within either lower extremity. Electronically Signed   By: Jerilynn Mages.  Shick M.D.   On: 07/11/2017 13:47   Dg Chest Port 1 View  Result Date: 07/11/2017 CLINICAL DATA:  Patient with syncopal episode.  Hypertension. EXAM: PORTABLE CHEST 1 VIEW COMPARISON:  Chest radiograph 01/19/2011 FINDINGS: Monitoring leads overlie the patient. Stable cardiac and mediastinal contours. Aortic tortuosity. Low lung volumes. Bibasilar heterogeneous pulmonary opacities. No pleural effusion or pneumothorax. IMPRESSION: Markedly low lung volumes. Basilar opacities favored to represent atelectasis. Consider further evaluation with PA and lateral chest radiograph. Electronically Signed   By: Lovey Newcomer M.D.   On: 07/11/2017 12:34    2-D echo Study Conclusions  - Left ventricle: The cavity size was normal. Wall thickness was   increased in a pattern of mild LVH. Systolic function was normal.   The estimated ejection fraction was in the range of 60% to 65%.   Wall  motion was normal; there were no regional wall motion   abnormalities. Doppler parameters are consistent with abnormal   left ventricular relaxation (grade 1 diastolic dysfunction). - Aortic valve: Mildly calcified annulus. Trileaflet. - Mitral valve: There was trivial regurgitation. - Right atrium: Central venous pressure (est): 3 mm Hg. - Tricuspid valve: There was trivial regurgitation. - Pericardium, extracardiac: A small pericardial effusion was   identified circumferential to the heart.  Impressions:  - Mild LVH with LVEF 60-65% and grade 1 diastolic dysfunction.   Trivial mitral regurgitation. Mildly calcified aortic annulus.   Trivial tricuspid regurgitation. Small circumferential   pericardial effusion.  Subjective: Has some back pain but much improved today. Was restless yesterday evening trying to pull out lines possibly because of sundowning. Remained stable without intervention.  Discharge Exam: Vitals:   07/12/17 2122 07/13/17 0408  BP: (!) 141/72 132/77  Pulse: 70 80  Resp: 20 18  Temp: 99.1 F (37.3 C) 99.2 F (37.3 C)   Vitals:   07/12/17 0500 07/12/17 1500 07/12/17 2122 07/13/17 0408  BP:  137/74 (!) 141/72 132/77  Pulse:  98 70 80  Resp:  _0 Temp:  98.7 F (37.1 C) 99.1 F (37.3 C) 99.2 F (37.3 C)  TempSrc:  Oral Oral Oral  SpO2:  100% 100% 100%  Weight: 65.4 kg (144 lb 2.9 oz)     Height:        Gen: not in distress HEENT: Pallor present, moist mucosa, supple neck Chest: clear b/l,  no added sounds CVS: N S1&S2, no murmurs, GI: soft, NT, ND,  Musculoskeletal: warm, no edema, tenderness over lower back     The results of significant diagnostics from this hospitalization (including imaging, microbiology, ancillary and laboratory) are listed below for reference.     Microbiology: Recent Results (from the past 240 hour(s))  Culture, Urine     Status: Abnormal   Collection Time: 07/10/17  4:52 AM  Result Value Ref Range Status    Specimen Description URINE, CLEAN CATCH  Final   Special Requests NONE  Final   Culture >=100,000 COLONIES/mL ESCHERICHIA COLI (A)  Final   Report Status 07/13/2017 FINAL  Final   Organism ID, Bacteria ESCHERICHIA COLI (A)  Final      Susceptibility   Escherichia coli - MIC*    AMPICILLIN 8 SENSITIVE Sensitive     CEFAZOLIN <=4 SENSITIVE Sensitive     CEFTRIAXONE <=1 SENSITIVE Sensitive     CIPROFLOXACIN <=0.25 SENSITIVE Sensitive     GENTAMICIN <=1 SENSITIVE Sensitive     IMIPENEM <=0.25 SENSITIVE Sensitive     NITROFURANTOIN <=16 SENSITIVE Sensitive     TRIMETH/SULFA <=20 SENSITIVE Sensitive     AMPICILLIN/SULBACTAM <=2 SENSITIVE Sensitive     PIP/TAZO <=4 SENSITIVE Sensitive     Extended ESBL NEGATIVE Sensitive     * >=100,000 COLONIES/mL ESCHERICHIA COLI     Labs: BNP (last 3 results) No results for input(s): BNP in the last 8760 hours. Basic Metabolic Panel:  Recent Labs Lab 07/09/17 1352 07/09/17 1659 07/10/17 0436 07/11/17 0637  NA 137  --  140 139  K 4.1  --  3.6 3.3*  CL 106  --  110 106  CO2 24  --  25 25  GLUCOSE 139*  --  106* 106*  BUN 21*  --  21* 15  CREATININE 1.33*  --  1.19* 1.07*  CALCIUM 9.4  --  8.7* 8.8*  MG  --  2.0  --  1.8   Liver Function Tests:  Recent Labs Lab 07/09/17 1352 07/11/17 0637  AST 43* 54*  ALT 32 50  ALKPHOS 39 46  BILITOT 1.0 1.1  PROT 8.3* 7.9  ALBUMIN 3.5 3.3*   No results for input(s): LIPASE, AMYLASE in the last 168 hours. No results for input(s): AMMONIA in the last 168 hours. CBC:  Recent Labs Lab 07/09/17 1352 07/10/17 0436 07/11/17 0637 07/13/17 0637  WBC 7.4 5.8 5.9 5.0  NEUTROABS 6.3 4.0 4.0  --   HGB 9.7* 9.6* 10.0* 9.1*  HCT 29.5* 28.5* 29.7* 27.7*  MCV 90.8 91.3 90.3 90.8  PLT 86* 69* 83* 80*   Cardiac Enzymes:  Recent Labs Lab 07/09/17 1352 07/09/17 1659 07/09/17 2244 07/10/17 0436  TROPONINI 0.08* 0.08* 0.09* 0.09*   BNP: Invalid input(s): POCBNP CBG:  Recent Labs Lab  07/11/17 0833 07/11/17 2155 07/12/17 0737 07/12/17 2120 07/13/17 0731  GLUCAP 103* 174* 84 94 91   D-Dimer  Recent Labs  07/10/17 1109  DDIMER 8.94*   Hgb A1c No results for input(s): HGBA1C in the last 72 hours. Lipid Profile No results for input(s): CHOL, HDL, LDLCALC, TRIG, CHOLHDL, LDLDIRECT in the last 72 hours. Thyroid function studies No results for input(s): TSH, T4TOTAL, T3FREE, THYROIDAB in the last 72 hours.  Invalid input(s): FREET3 Anemia work up No results for input(s): VITAMINB12, FOLATE, FERRITIN, TIBC, IRON, RETICCTPCT in the last 72 hours. Urinalysis    Component Value Date/Time   COLORURINE YELLOW 07/09/2017 [place]  07/09/2017 1346   LABSPEC 1.015 07/09/2017 1346   PHURINE 6.0 07/09/2017 1346   GLUCOSEU NEGATIVE 07/09/2017 1346   HGBUR TRACE (A) 07/09/2017 1346   BILIRUBINUR NEGATIVE 07/09/2017 1346   KETONESUR NEGATIVE 07/09/2017 1346   PROTEINUR NEGATIVE 07/09/2017 1346   UROBILINOGEN 0.2 08/26/2015 1130   NITRITE POSITIVE (A) 07/09/2017 1346   LEUKOCYTESUR SMALL (A) 07/09/2017 1346   Sepsis Labs Invalid input(s): PROCALCITONIN,  WBC,  LACTICIDVEN Microbiology Recent Results (from the past 240 hour(s))  Culture, Urine     Status: Abnormal   Collection Time: 07/10/17  4:52 AM  Result Value Ref Range Status   Specimen Description URINE, CLEAN CATCH  Final   Special Requests NONE  Final   Culture >=100,000 COLONIES/mL ESCHERICHIA COLI (A)  Final   Report Status 07/13/2017 FINAL  Final   Organism ID, Bacteria ESCHERICHIA COLI (A)  Final      Susceptibility   Escherichia coli - MIC*    AMPICILLIN 8 SENSITIVE Sensitive     CEFAZOLIN <=4 SENSITIVE Sensitive     CEFTRIAXONE <=1 SENSITIVE Sensitive     CIPROFLOXACIN <=0.25 SENSITIVE Sensitive     GENTAMICIN <=1 SENSITIVE Sensitive     IMIPENEM <=0.25 SENSITIVE Sensitive     NITROFURANTOIN <=16 SENSITIVE Sensitive     TRIMETH/SULFA <=20 SENSITIVE Sensitive      AMPICILLIN/SULBACTAM <=2 SENSITIVE Sensitive     PIP/TAZO <=4 SENSITIVE Sensitive     Extended ESBL NEGATIVE Sensitive     * >=100,000 COLONIES/mL ESCHERICHIA COLI     Time coordinating discharge: Over 30 minutes  SIGNED:   Louellen Molder, MD  [facility] 07/13/2017, 11:03 AM Pager   If 7PM-7AM, please contact night-coverage [email or web address] Password [email or web address]

## 2017-07-13 NOTE — Clinical Social Work Note (Signed)
Clinical Social Work Assessment  Patient Details  Name: Amanda Wu MRN: [id number] Date of Birth: 1946/03/23  Date of referral:  07/13/17               Reason for consult:  Facility Placement, Discharge Planning                Permission sought to share information with:  Case Manager, Customer service manager, Family Supports Permission granted to share information::  Yes, Verbal Permission Granted  Name::        Agency::     Relationship::  Sisters/other family members  Contact Information:     Housing/Transportation Living arrangements for the past 2 months:  [place] of Information:  Patient, Medical Team, Case Manager, Adult Children Patient Interpreter Needed:  None Criminal Activity/Legal Involvement Pertinent to Current Situation/Hospitalization:  No - Comment as needed Significant Relationships:  Other Family Members, Siblings, Friend Lives with:  Siblings, Relatives Do you feel safe going back to the place where you live?  No Need for family participation in patient care:  No (Coment)  Care giving concerns:  Patient lives at home with family where she is fairly independent and still drives short distances.  Admitted for syncope Per patient, yesterday morning while at home she got up just after she finished it in her oatmeal for breakfast. She mentions that upon arising she developed dizziness, became nauseous, diaphoretic, having 1 episode of emesis and vomiting the contents of her breakfast, then subsequently passing out. The patient mentions that after she woke up she noticed that she was having back pain, which was new.  Patient lives with sisters who are also disabled and in wheelchair.  Patient and family feel patient will benefit from short term rehab.    Social Worker assessment / plan:  Assessment completed and patient agreeable for SNF.   Patient needs insurance auth, however due to patient being medically stable for discharge today, will  discuss with Leadership ability to provide LOG. SNF work up completed. Awaiting review and bed offers Family requesting [place]    Employment status:  Retired Nurse, adult PT Recommendations:  [place] / Referral to community resources:  [place]  Patient/Family's Response to care:  Agreeable will follow up  Patient/Family's Understanding of and Emotional Response to Diagnosis, Current Treatment, and Prognosis:  Understanding and agreeable to plan for treatment/discharge.  Emotional Assessment Appearance:  Appears stated age Attitude/Demeanor/Rapport:    Affect (typically observed):  Accepting, Adaptable, Pleasant Orientation:  Oriented to Self, Oriented to Place, Oriented to  Time, Oriented to Situation Alcohol / Substance use:  Not Applicable Psych involvement (Current and /or in the community):  No (Comment)  Discharge Needs  Concerns to be addressed:  Denies Needs/Concerns at this time Readmission within the last 30 days:  No Current discharge risk:  None Barriers to Discharge:  Ship broker, Continued Medical Work up   [employer], LCSW 07/13/2017, 10:12 AM

## 2017-07-13 NOTE — Evaluation (Signed)
Occupational Therapy Evaluation Patient Details Name: Amanda Wu MRN: [id number] DOB: November 21, 1946 Today's Date: 07/13/2017    History of Present Illness Per patient, yesterday morning while at home she got up just after she finished it in her oatmeal for breakfast. She mentions that upon arising she developed dizziness, became nauseous, diaphoretic, having 1 episode of emesis and vomiting the contents of her breakfast, then subsequently passing out. The patient mentions that after she woke up she noticed that she was having back pain, which was new. She denies chest pain, dyspnea, palpitations, recent PND, orthopnea or pitting edema lower extremities. She denies abdominal pain, diarrhea, constipation, melena or hematochezia. She denies dysuria, frequency or hematuria. She denies polyuria, polydipsia or blurred vision.   Clinical Impression   Pt in bed upon therapy arrival and agreeable to participate in OT evaluation. Pt was able to transfer to recliner this session using RW. VC for safety and technique during transfer. Patient did well and only required Min Assist for safety. Pt would benefit from skilled OT services at SNF to increase UB strength and ADL performance.     Follow Up Recommendations  SNF    Equipment Recommendations  None recommended by OT       Precautions / Restrictions Precautions Precautions: Fall;Other (comment) Precaution Comments: vertebral compression fracture and recent fall at home. Restrictions Weight Bearing Restrictions: No      Mobility Bed Mobility Overal bed mobility: Needs Assistance Bed Mobility: Rolling Rolling: Min guard         General bed mobility comments: VC for technique and safety to log roll.  Transfers Overall transfer level: Needs assistance Equipment used: Rolling walker (2 wheeled) Transfers: Sit to/from Stand Sit to Stand: Min guard;From elevated surface                  ADL either performed or assessed with  clinical judgement   ADL Overall ADL's : Needs assistance/impaired                     Lower Body Dressing: Supervision/safety;Sit to/from stand   Toilet Transfer: Minimal assistance;RW;BSC                   Vision Baseline Vision/History: Wears glasses Wears Glasses: At all times Patient Visual Report: No change from baseline Vision Assessment?: No apparent visual deficits            Pertinent Vitals/Pain Pain Assessment: No/denies pain Pain Score: 8  Pain Location: low back Pain Descriptors / Indicators: Aching Pain Intervention(s): Patient requesting pain meds-RN notified;RN gave pain meds during session;Monitored during session     Hand Dominance Right   Extremity/Trunk Assessment Upper Extremity Assessment Upper Extremity Assessment: Generalized weakness   Lower Extremity Assessment Lower Extremity Assessment: Defer to PT evaluation       Communication Communication Communication: No difficulties   Cognition Arousal/Alertness: Awake/alert Behavior During Therapy: WFL for tasks assessed/performed Overall Cognitive Status: No family/caregiver present to determine baseline cognitive functioning                   Home Living Family/patient expects to be discharged to:: Skilled nursing facility Living Arrangements: Other relatives (3 sisters) Available Help at Discharge: Family Type of Home: House Home Access: Level entry     Home Layout: One level               Home Equipment: Grab bars - tub/shower   Additional Comments: patient statse she was fairly independent  before she was admitted to hospital       Prior Functioning/Environment Level of Independence: Independent                              AM-PAC PT "6 Clicks" Daily Activity     Outcome Measure Help from another person eating meals?: None Help from another person taking care of personal grooming?: None Help from another person toileting, which includes  using toliet, bedpan, or urinal?: A Lot Help from another person bathing (including washing, rinsing, drying)?: A Little Help from another person to put on and taking off regular upper body clothing?: None Help from another person to put on and taking off regular lower body clothing?: A Little 6 Click Score: 20   End of Session Equipment Utilized During Treatment: Gait belt;Rolling walker Nurse Communication: Other (comment) (Need to replace external catheter)  Activity Tolerance: Patient tolerated treatment well Patient left: in chair;with call bell/phone within reach  OT Visit Diagnosis: Muscle weakness (generalized) (M62.81)                Time: 9983-3825 OT Time Calculation (min): 20 min Charges:  OT General Charges $OT Visit: 1 Procedure OT Evaluation $OT Eval Low Complexity: 1 Procedure G-Codes: OT G-codes **NOT FOR INPATIENT CLASS** Functional Assessment Tool Used: AM-PAC 6 Clicks Daily Activity Functional Limitation: Self care Self Care Current Status (K5397): At least 20 percent but less than 40 percent impaired, limited or restricted Self Care Goal Status (Q7341): At least 20 percent but less than 40 percent impaired, limited or restricted Self Care Discharge Status [phone]): At least 20 percent but less than 40 percent impaired, limited or restricted   Amanda Wu, OTR/L,CBIS  [phone]   Tempestt Silba, Clarene Duke 07/13/2017, 10:15 AM

## 2017-07-14 ENCOUNTER — Encounter: Payer: Self-pay | Admitting: Internal Medicine

## 2017-07-14 ENCOUNTER — Encounter (HOSPITAL_COMMUNITY)
Admission: RE | Admit: 2017-07-14 | Discharge: 2017-07-14 | Disposition: A | Discharge status: Home / Self Care | Payer: Medicare PPO | Source: Skilled Nursing Facility | Attending: *Deleted | Admitting: *Deleted

## 2017-07-14 ENCOUNTER — Non-Acute Institutional Stay (SKILLED_NURSING_FACILITY): Payer: Medicare PPO | Admitting: Internal Medicine

## 2017-07-14 DIAGNOSIS — S32010D Wedge compression fracture of first lumbar vertebra, subsequent encounter for fracture with routine healing: Secondary | ICD-10-CM | POA: Diagnosis not present

## 2017-07-14 DIAGNOSIS — D696 Thrombocytopenia, unspecified: Secondary | ICD-10-CM

## 2017-07-14 DIAGNOSIS — C9001 Multiple myeloma in remission: Secondary | ICD-10-CM | POA: Diagnosis not present

## 2017-07-14 DIAGNOSIS — R55 Syncope and collapse: Secondary | ICD-10-CM

## 2017-07-14 DIAGNOSIS — E119 Type 2 diabetes mellitus without complications: Secondary | ICD-10-CM | POA: Diagnosis not present

## 2017-07-14 DIAGNOSIS — B9620 Unspecified Escherichia coli [E. coli] as the cause of diseases classified elsewhere: Secondary | ICD-10-CM | POA: Diagnosis not present

## 2017-07-14 DIAGNOSIS — N390 Urinary tract infection, site not specified: Secondary | ICD-10-CM

## 2017-07-14 LAB — CBC WITH DIFFERENTIAL/PLATELET
BASOS ABS: 0 10*3/uL (ref 0.0–0.1)
BASOS PCT: 1 %
Eosinophils Absolute: 0.3 10*3/uL (ref 0.0–0.7)
Eosinophils Relative: 6 %
HEMATOCRIT: 27.7 % — AB (ref 36.0–46.0)
HEMOGLOBIN: 9.4 g/dL — AB (ref 12.0–15.0)
LYMPHS PCT: 27 %
Lymphs Abs: 1.4 10*3/uL (ref 0.7–4.0)
MCH: 30.7 pg (ref 26.0–34.0)
MCHC: 33.9 g/dL (ref 30.0–36.0)
MCV: 90.5 fL (ref 78.0–100.0)
Monocytes Absolute: 0.4 10*3/uL (ref 0.1–1.0)
Monocytes Relative: 8 %
NEUTROS ABS: 3 10*3/uL (ref 1.7–7.7)
NEUTROS PCT: 58 %
Platelets: 98 10*3/uL — ABNORMAL LOW (ref 150–400)
RBC: 3.06 MIL/uL — AB (ref 3.87–5.11)
RDW: 16.9 % — AB (ref 11.5–15.5)
WBC: 5.2 10*3/uL (ref 4.0–10.5)

## 2017-07-14 LAB — BASIC METABOLIC PANEL
Anion gap: 7 (ref 5–15)
BUN: 26 mg/dL — ABNORMAL HIGH (ref 6–20)
CHLORIDE: 113 mmol/L — AB (ref 101–111)
CO2: 23 mmol/L (ref 22–32)
CREATININE: 1.2 mg/dL — AB (ref 0.44–1.00)
Calcium: 8.9 mg/dL (ref 8.9–10.3)
GFR calc non Af Amer: 44 mL/min — ABNORMAL LOW (ref >60)
GFR, EST AFRICAN AMERICAN: 51 mL/min — AB (ref >60)
Glucose, Bld: 95 mg/dL (ref 65–99)
POTASSIUM: 3.7 mmol/L (ref 3.5–5.1)
Sodium: 143 mmol/L (ref 135–145)

## 2017-07-14 NOTE — Progress Notes (Signed)
Location:   [facility] Room Number: 116/D Place of Service:  SNF [phone]) Provider:  Providence Crosby, MD  Patient Care Team: Vedia Coffer, MD as PCP - General (Internal Medicine)  Extended Emergency Contact Information Primary Emergency Contact: Minerva Ends Address: [address], [place] Phone: [phone] Relation: Sister Secondary Emergency Contact: Maryann Alar Address: [address]          [place],  [address] [place] Phone: [phone] Relation: Sister  Code Status:  Full Code Goals of care: Advanced Directive information Advanced Directives 07/09/2017  Does Patient Have a Medical Advance Directive? Yes  Type of Advance Directive Living will  Does patient want to make changes to medical advance directive? No - Patient declined  Would patient like information on creating a medical advance directive? -     Chief complaint-acute visit following hospitalization for syncope-lumbar compression fracture  HPI:  Pt is a 71 y.o. female seen today for an acute visit for hospitalization following a syncopal episode with a lumbar compression fracture.  Patient has a history of multiple myeloma in remission on Revlimid-also history of hypertension C&S lymphoma type 2 diabetes partial nephrectomy hypothyroidism.  She was admitted to the hospital with syncope and found to have a compression fracture of the L1 vertebrae secondary to the fall.  Workup for syncope was relatively unremarkable echo and VQ scan did not show anything acute.  There was a very low suspicion for seizures based on history at this point appears to be stabilized.  Regards to the lumbar compression fracture all in syncope she is on pain control with as needed Vicodin this appears to be stable she is here for therapy.  She is completing a 5 day course of Keflex for possible UTI Escherichia coli it was  thought possibly UTI contributed to her fall.  Regards to diabetes she is on Glucophage this appears stable CBG today was 101.  She also has a history of thrombocytopenia thought secondary possibly to Revlimid-lab today is 98,000 which appears relatively baseline.  She also has a history of mild hypokalemia she is on potassium potassium today is 3.7.  Currently she has no acute complaints at this point back pain appears to be controlled she continues to be pleasant sitting comfortably in her wheelchair vital signs appear to be stable     Past Medical History:  Diagnosis Date  . Benign hypertension   . Cataract   . 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 Diabetes mellitus without complication ([place])   . H/O partial nephrectomy   . Hypokalemia   . Hypothyroidism   . Impaired cognition   . Multiple myeloma ([place])   . Thyroid disease    Past Surgical History:  Procedure Laterality Date  . ABDOMINAL HYSTERECTOMY    . YAG LASER APPLICATION Right 06/05/6332   Procedure: YAG LASER APPLICATION;  Surgeon: [name], MD;  Location: [facility];  Service: Ophthalmology;  Laterality: Right;    Allergies  Allergen Reactions  . Lotensin [Benazepril Hcl]     Facial swelling    Outpatient Encounter Prescriptions as of 07/14/2017  Medication Sig  . acetaminophen (TYLENOL) 500 MG tablet Take 1,000 mg by mouth every 6 (six) hours as needed for mild pain.   Marland Kitchen amLODipine (NORVASC) 5 MG tablet Take 5 mg by mouth daily.   . cephALEXin (KEFLEX) 500 MG  capsule Take 1 capsule (500 mg total) by mouth 2 (two) times daily.  . diphenhydramine-acetaminophen (TYLENOL PM) 25-500 MG TABS Take 1 tablet by mouth at bedtime as needed (sleep/pain).   . ferrous sulfate 325 (65 FE) MG tablet Take 325 mg by mouth daily with breakfast.  . HYDROcodone-acetaminophen (NORCO/VICODIN) 5-325 MG tablet Take 1 tablet by mouth every 4 (four) hours as needed.  Marland Kitchen lenalidomide (REVLIMID) 15 MG capsule Take 15 mg by mouth  daily. For two weeks then off for three weeks. Then follow up doctor  . levothyroxine (SYNTHROID, LEVOTHROID) 25 MCG tablet Take 25 mcg by mouth daily before breakfast.  . losartan (COZAAR) 100 MG tablet Take 100 mg by mouth daily.  . metFORMIN (GLUCOPHAGE) 500 MG tablet Take 250 mg by mouth every other day.  . Multiple Vitamins-Minerals (CENTRUM ADULTS PO) Take 1 tablet by mouth daily.  . potassium chloride (K-DUR) 10 MEQ tablet Take 30 mEq by mouth daily.  . [DISCONTINUED] dexamethasone (DECADRON) 4 MG tablet Take 4 mg by mouth. Take 5 tablets every Monday when you have chemo therapy.  . [DISCONTINUED] loperamide (IMODIUM) 2 MG capsule Take 2 mg by mouth as needed for diarrhea or loose stools.   No facility-administered encounter medications on file as of 07/14/2017.     Review of Systems   Gen. denies any fever or chills appears to be in good spirits.  Skin does not complain of rashes or itching.  Head ears eyes nose mouth and throat does not complain of r sore throat.--Says she has some chronic blurriness of her left eye  Respiratory denies cough or shortness of breath.  Cardiac does not complain of chest pain does not appear to have significant lower extremity edema.  GU does not really complain of dysuria she is receiving a course of Keflex for possible Escherichia coli UTI.  GI does not complain of abdominal discomfort nausea vomiting diarrhea or constipation.  Musculoskeletal history of lumbar compression fracture at this point pain appears to be controlled with the Vicodin does not complain of joint pain-at risk of fractures with history of multiple myeloma Continues to have significant weakness when attempting to stand.  Neurologic does not at this point complain of dizziness headache or  further syncopal episodes.--Does have lower extremity weakness as noted above  Psych continues to be in good spirits does not complain of anxiety or depressive symptoms.    Immunization  History  Administered Date(s) Administered  . Influenza-Unspecified 11/15/2015, 10/16/2016  . Pneumococcal Polysaccharide-23 07/10/2017  . Tdap 10/14/2012, 08/26/2015   There are no preventive care reminders to display for this patient. No flowsheet data found. Functional Status Survey:    Vitals:   07/14/17 1135  BP: 114/75  Pulse: 76  Resp: 20  Temp: 97.9 F (36.6 C)  TempSrc: Oral  SpO2: 97%  Weight: 130 lb 12.8 oz (59.3 kg)    Physical Exam   In general this is a pleasant somewhat thin elderly female in no distress sitting comfortably on the side of her bed.  Her skin is warm and dry.  Eyes she has prescription lenses she does complain of some left eye blurriness which is chronic--the visual acuity appears grossly intact.  Oropharynx clear mucous membranes moist.  Chest is clear to auscultation there is no labored breathing.  Heart is regular rate and rhythm without murmur gallop or rub she does not really have significant lower extremity edema pedal pulses are intact bilaterally.  Her abdomen is soft nontender with positive  bowel sounds.  Muscle skeletal does move all extremities 4 does have lower extremity weakness and is quite weak when attempting to stand up she actually needs assistance  Neurologic again has lower extremity weakness I could not really appreciate lateralizing findings her speech is clear.  Psych she is alert and oriented pleasant and appropriate  Labs reviewed:  Recent Labs  07/09/17 1659 07/10/17 0436 07/11/17 0637 07/14/17 0615  NA  --  140 139 143  K  --  3.6 3.3* 3.7  CL  --  110 106 113*  CO2  --  _0 GLUCOSE  --  106* 106* 95  BUN  --  21* 15 26*  CREATININE  --  1.19* 1.07* 1.20*  CALCIUM  --  8.7* 8.8* 8.9  MG 2.0  --  1.8  --     Recent Labs  07/09/17 1352 07/11/17 0637  AST 43* 54*  ALT 32 50  ALKPHOS 39 46  BILITOT 1.0 1.1  PROT 8.3* 7.9  ALBUMIN 3.5 3.3*    Recent Labs  07/10/17 0436  07/11/17 0637 07/13/17 0637 07/14/17 0615  WBC 5.8 5.9 5.0 5.2  NEUTROABS 4.0 4.0  --  3.0  HGB 9.6* 10.0* 9.1* 9.4*  HCT 28.5* 29.7* 27.7* 27.7*  MCV 91.3 90.3 90.8 90.5  PLT 69* 83* 80* 98*   No results found for: TSH No results found for: HGBA1C No results found for: CHOL, HDL, LDLCALC, LDLDIRECT, TRIG, CHOLHDL  Significant Diagnostic Results in last 30 days:  Dg Lumbar Spine Complete  Result Date: 07/09/2017 CLINICAL DATA:  Pain of the pelvis and lower back extending into bilateral hips. EXAM: LUMBAR SPINE - COMPLETE 4+ VIEW COMPARISON:  Chest x-ray January 19, 2011 FINDINGS: There is 50% compression deformity of L1, new since prior chest x-ray of February 2012. The chronicity is indeterminate. If the patient has focal pain here, acute fracture is not excluded. Degenerative joint changes at L5-S1 are noted. There is scoliosis of spine. IMPRESSION: There is 50% compression deformity of L1, new since prior chest x-ray of February 2012. The chronicity is indeterminate. If the patient has focal pain here, acute fracture is not excluded. Electronically Signed   By: Abelardo Diesel M.D.   On: 07/09/2017 15:08   Ct Head Wo Contrast  Result Date: 07/09/2017 CLINICAL DATA:  Syncopal episode yesterday EXAM: CT HEAD WITHOUT CONTRAST TECHNIQUE: Contiguous axial images were obtained from the base of the skull through the vertex without intravenous contrast. COMPARISON:  08/26/2015 FINDINGS: Brain: Bifrontal encephalomalacia changes are again seen and stable. No findings to suggest acute hemorrhage, acute infarction or space-occupying mass lesion are noted. Small calcification is noted just to the left of the sella turcica likely related to a small meningioma. It is stable in appearance. Vascular: No hyperdense vessel or unexpected calcification. Skull: Postsurgical changes are seen. A lucent lesion is noted within the right parietal bone stable from the prior exam Sinuses/Orbits: No acute finding. Other:  Mild scalp hematoma is noted in the left parietal region likely related to recent injury. IMPRESSION: Chronic changes similar to that noted on the prior exam. No acute intracranial abnormality is noted. Small scalp hematoma is noted on the left. Electronically Signed   By: [name] M.D.   On: 07/09/2017 14:51   US Carotid Bilateral  Result Date: 07/10/2017 CLINICAL DATA:  71 year old female with a history of syncope. Cardiovascular risk factors include hypertension, diabetes EXAM: BILATERAL CAROTID DUPLEX ULTRASOUND TECHNIQUE: Pearline Cables scale imaging, color  Doppler and duplex ultrasound were performed of bilateral carotid and vertebral arteries in the neck. COMPARISON:  No prior duplex FINDINGS: Criteria: Quantification of carotid stenosis is based on velocity parameters that correlate the residual internal carotid diameter with NASCET-based stenosis levels, using the diameter of the distal internal carotid lumen as the denominator for stenosis measurement. The following velocity measurements were obtained: RIGHT ICA:  Systolic 80 cm/sec, Diastolic 30 cm/sec CCA:  53 cm/sec SYSTOLIC ICA/CCA RATIO:  1.5 ECA:  54 cm/sec LEFT ICA:  Systolic 83 cm/sec, Diastolic 26 cm/sec CCA:  62 cm/sec SYSTOLIC ICA/CCA RATIO:  1.3 ECA:  61 cm/sec Right Brachial SBP: Not acquired Left Brachial SBP: Not acquired RIGHT CAROTID ARTERY: No significant calcified disease of the right common carotid artery. Intermediate waveform maintained. Mild plaque without significant calcifications at the right carotid bifurcation. Low resistance waveform of the right ICA. No significant tortuosity. RIGHT VERTEBRAL ARTERY: Antegrade flow with low resistance waveform. LEFT CAROTID ARTERY: No significant calcified disease of the left common carotid artery. Intermediate waveform maintained. Mild plaque at the left carotid bifurcation without significant calcifications. Low resistance waveform of the left ICA. LEFT VERTEBRAL ARTERY:  Antegrade flow with  low resistance waveform. IMPRESSION: Color duplex indicates minimal homogeneous plaque, with no hemodynamically significant stenosis by duplex criteria in the extracranial cerebrovascular circulation. Signed, Dulcy Fanny. Earleen Newport, DO [facility] Radiology Electronically Signed   By: Corrie Mckusick D.O.   On: 07/10/2017 10:16   Nm Pulmonary Perf And Vent  Result Date: 07/11/2017 CLINICAL DATA:  Shortness of breath. EXAM: NUCLEAR MEDICINE VENTILATION - PERFUSION LUNG SCAN TECHNIQUE: Ventilation images were obtained in multiple projections using inhaled aerosol Tc-72mDTPA. Perfusion images were obtained in multiple projections after intravenous injection of Tc-963mAA. RADIOPHARMACEUTICALS:  Thirty mCi Technetium-9934mPA aerosol inhalation and 4.1 mCi Technetium-78m2m IV COMPARISON:  None. FINDINGS: Ventilation: There is a small ventilation defect identified along the left lung base. Perfusion: No unmatched wedge shaped peripheral perfusion defects to suggest acute pulmonary embolism. Small perfusion defect is identified along the left base which corresponds with the ventilation abnormality. IMPRESSION: Very low probability for acute pulmonary embolus. Electronically Signed   By: TaylKerby Moors.   On: 07/11/2017 14:39   Us VKoreaous Img Lower Bilateral  Result Date: 07/11/2017 CLINICAL DATA:  Bilateral lower extremity edema, elevated D-dimer EXAM: BILATERAL LOWER EXTREMITY VENOUS DOPPLER ULTRASOUND TECHNIQUE: Gray-scale sonography with graded compression, as well as color Doppler and duplex ultrasound were performed to evaluate the lower extremity deep venous systems from the level of the common femoral vein and including the common femoral, femoral, profunda femoral, popliteal and calf veins including the posterior tibial, peroneal and gastrocnemius veins when visible. The superficial great saphenous vein was also interrogated. Spectral Doppler was utilized to  evaluate flow at rest and with distal augmentation maneuvers in the common femoral, femoral and popliteal veins. COMPARISON:  None. FINDINGS: RIGHT LOWER EXTREMITY Common Femoral Vein: No evidence of thrombus. Normal compressibility, respiratory phasicity and response to augmentation. Saphenofemoral Junction: No evidence of thrombus. Normal compressibility and flow on color Doppler imaging. Profunda Femoral Vein: No evidence of thrombus. Normal compressibility and flow on color Doppler imaging. Femoral Vein: No evidence of thrombus. Normal compressibility, respiratory phasicity and response to augmentation. Popliteal Vein: No evidence of thrombus. Normal compressibility, respiratory phasicity and response to augmentation. Calf Veins: No evidence of thrombus. Normal compressibility and flow on color Doppler imaging. Superficial Great Saphenous Vein: No evidence of thrombus. Normal compressibility and  flow on color Doppler imaging. Venous Reflux:  None. Other Findings:  None. LEFT LOWER EXTREMITY Common Femoral Vein: No evidence of thrombus. Normal compressibility, respiratory phasicity and response to augmentation. Saphenofemoral Junction: No evidence of thrombus. Normal compressibility and flow on color Doppler imaging. Profunda Femoral Vein: No evidence of thrombus. Normal compressibility and flow on color Doppler imaging. Femoral Vein: No evidence of thrombus. Normal compressibility, respiratory phasicity and response to augmentation. Popliteal Vein: No evidence of thrombus. Normal compressibility, respiratory phasicity and response to augmentation. Calf Veins: No evidence of thrombus. Normal compressibility and flow on color Doppler imaging. Superficial Great Saphenous Vein: No evidence of thrombus. Normal compressibility and flow on color Doppler imaging. Venous Reflux:  None. Other Findings:  None. IMPRESSION: No evidence of DVT within either lower extremity. Electronically Signed   By: Jerilynn Mages.  Shick M.D.   On:  07/11/2017 13:47   Dg Chest Port 1 View  Result Date: 07/11/2017 CLINICAL DATA:  Patient with syncopal episode.  Hypertension. EXAM: PORTABLE CHEST 1 VIEW COMPARISON:  Chest radiograph 01/19/2011 FINDINGS: Monitoring leads overlie the patient. Stable cardiac and mediastinal contours. Aortic tortuosity. Low lung volumes. Bibasilar heterogeneous pulmonary opacities. No pleural effusion or pneumothorax. IMPRESSION: Markedly low lung volumes. Basilar opacities favored to represent atelectasis. Consider further evaluation with PA and lateral chest radiograph. Electronically Signed   By: Lovey Newcomer M.D.   On: 07/11/2017 12:34    Assessment/Plan  1 history of syncope possibly vasovagal versus orthostatic-possibly secondary to UTI-low suspicion for seizures at this point will monitor no further syncopal episodes.  #2 history of lumbar compression fracture she will need PT and OT she is receiving Vicodin as needed for pain continues to have significant lower extremity weakness at this point will monitor continued therapy.  #3 history of UTI Escherichia coli she is completing course of antibiotics this appears relatively asymptomatic currently.  #4 history diabetes type 2 this appears well controlled on Glucophage CBGs today was 101.  #5 history of elevated troponin the hospital this was thought to be demand ischemia related she does not complain of chest pain or overt symptoms.  #6 history of hypertension this appears stable at this point on Norvasc and Cozaar blood pressure today 114/75 will monitor.  #7 history of hypokalemia this is been supplemented potassium 3.7 today update BMP is pending.  #8 history of multiple myeloma this is followed by oncology she is on Revlimid.  #9 history of thrombocytopenia thought possibly secondary to the Revlimid this appears stable at 98,000 today.  #10 history of hypothyroidism she is on Synthroid will update TSH.  #11 history of renal insufficiency this  resolved with IV fluids in the hospital updated BMP is pending-fluids will have to be encouraged.  #12 history of iron deficiency anemia she is on iron hemoglobin appears stable at 9.4 today.  [id number] note greater than 40 minutes spent assessing patient-reviewing her chart-reviewing her labs-and coordinating and formulating a plan of care for numerous diagnoses-of note greater than 50% of time spent coordinating plan of care

## 2017-07-15 ENCOUNTER — Encounter: Payer: Self-pay | Admitting: Internal Medicine

## 2017-07-15 ENCOUNTER — Non-Acute Institutional Stay (SKILLED_NURSING_FACILITY): Payer: Medicare PPO | Admitting: Internal Medicine

## 2017-07-15 DIAGNOSIS — I10 Essential (primary) hypertension: Secondary | ICD-10-CM | POA: Diagnosis not present

## 2017-07-15 DIAGNOSIS — B9620 Unspecified Escherichia coli [E. coli] as the cause of diseases classified elsewhere: Secondary | ICD-10-CM

## 2017-07-15 DIAGNOSIS — E119 Type 2 diabetes mellitus without complications: Secondary | ICD-10-CM | POA: Diagnosis not present

## 2017-07-15 DIAGNOSIS — S32010D Wedge compression fracture of first lumbar vertebra, subsequent encounter for fracture with routine healing: Secondary | ICD-10-CM | POA: Diagnosis not present

## 2017-07-15 DIAGNOSIS — G3184 Mild cognitive impairment, so stated: Secondary | ICD-10-CM | POA: Diagnosis not present

## 2017-07-15 DIAGNOSIS — R55 Syncope and collapse: Secondary | ICD-10-CM | POA: Diagnosis not present

## 2017-07-15 DIAGNOSIS — N390 Urinary tract infection, site not specified: Secondary | ICD-10-CM | POA: Diagnosis not present

## 2017-07-15 NOTE — Assessment & Plan Note (Addendum)
Allergy history includes angioedema with  ACE inhibitor (Lotensin) Rxed by former PCP in [place] . Although she reports no adverse effect with the ARB Losartan to date, there may be risk of recurrent angioedema. Possibly discontinuing losartan will be discussed with her present PCP, Dr Manuella Ghazi

## 2017-07-15 NOTE — Progress Notes (Signed)
Provider: Unice Cobble MD  Location:   [facility] Room Number: 116/D Place of Service:  SNF (31)  PCP: Vedia Coffer, MD Patient Care Team: Vedia Coffer, MD as PCP - General (Internal Medicine)  Extended Emergency Contact Information Primary Emergency Contact: Minerva Ends Address: [address], [place] Phone: [phone] Relation: Sister Secondary Emergency Contact: Maryann Alar Address: [address]          [place], [place] [address] [place] Phone: [phone] Relation: Sister  Code Status: DNR Goals of Care: Advanced Directive information Advanced Directives 07/15/2017  Does Patient Have a Medical Advance Directive? Yes  Type of Advance Directive Out of facility DNR (pink MOST or yellow form)  Does patient want to make changes to medical advance directive? No - Patient declined  Would patient like information on creating a medical advance directive? -      Chief Complaint  Patient presents with  . New Admit To SNF    HPI: Patient is a 71 y.o. female seen today for admission to  Past Medical History:  Diagnosis Date  . Benign hypertension   . Cataract   . 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 Diabetes mellitus without complication ([place])   . H/O partial nephrectomy   . Hypokalemia   . Hypothyroidism   . Impaired cognition   . Multiple myeloma ([facility])    Dr Maylon Peppers, [facility]  . Thyroid disease    Past Surgical History:  Procedure Laterality Date  . ABDOMINAL HYSTERECTOMY    . CRANIOTOMY     for lymphoma  . YAG LASER APPLICATION Right 06/11/6380   Procedure: YAG LASER APPLICATION;  Surgeon: [name], MD;  Location: [facility];  Service: Ophthalmology;  Laterality: Right;    reports that she has never smoked. She has never used smokeless tobacco. She reports that she does not drink alcohol or use drugs. Social History   Social History  . Marital status:  Single    Spouse name: N/A  . Number of children: N/A  . Years of education: N/A   Occupational History  . Not on file.   Social History Main Topics  . Smoking status: Never Smoker  . Smokeless tobacco: Never Used  . Alcohol use No  . Drug use: No  . Sexual activity: Not on file   Other Topics Concern  . Not on file   Social History Narrative  . No narrative on file    Functional Status Survey:    Family History  Problem Relation Age of Onset  . Depression Mother   . Diabetes Mother   . Heart disease Father   . Cancer - Prostate Brother   . Cancer Brother   . Cancer Sister     There are no preventive care reminders to display for this patient.  Allergies  Allergen Reactions  . Lotensin [Benazepril Hcl]     Facial swelling (Angioedema) Note: ARBs should also be contraindicated, but she is on Losartan!    Outpatient Encounter Prescriptions as of 07/15/2017  Medication Sig  . acetaminophen (TYLENOL) 500 MG tablet Take 1,000 mg by mouth every 6 (six) hours as needed for mild pain.   Marland Kitchen amLODipine (NORVASC) 5 MG tablet Take 5 mg by mouth daily.   . cephALEXin (KEFLEX) 500 MG capsule Take 1 capsule (500 mg total) by mouth 2 (two) times daily.  . diphenhydramine-acetaminophen (  TYLENOL PM) 25-500 MG TABS Take 1 tablet by mouth at bedtime as needed (sleep/pain).   . ferrous sulfate 325 (65 FE) MG tablet Take 325 mg by mouth daily with breakfast.  . HYDROcodone-acetaminophen (NORCO/VICODIN) 5-325 MG tablet Take 1 tablet by mouth every 4 (four) hours as needed.  Marland Kitchen lenalidomide (REVLIMID) 15 MG capsule Take 15 mg by mouth daily. For two weeks then off for three weeks. Then follow up doctor  . levothyroxine (SYNTHROID, LEVOTHROID) 25 MCG tablet Take 25 mcg by mouth daily before breakfast.  . losartan (COZAAR) 100 MG tablet Take 100 mg by mouth daily.  . metFORMIN (GLUCOPHAGE) 500 MG tablet Take 250 mg by mouth every other day.  . Multiple Vitamins-Minerals (CENTRUM ADULTS PO)  Take 1 tablet by mouth daily.  . potassium chloride (K-DUR) 10 MEQ tablet Take 30 mEq by mouth daily.   No facility-administered encounter medications on file as of 07/15/2017.      Review of Systems  Vitals:   07/15/17 1112  BP: 134/61  Pulse: 89  Resp: 20  Temp: (!) 97.1 F (36.2 C)  TempSrc: Oral   There is no height or weight on file to calculate BMI. Physical Exam  Labs reviewed: Basic Metabolic Panel:  Recent Labs  07/09/17 1659 07/10/17 0436 07/11/17 0637 07/14/17 0615  NA  --  140 139 143  K  --  3.6 3.3* 3.7  CL  --  110 106 113*  CO2  --  '25 25 23  ' GLUCOSE  --  106* 106* 95  BUN  --  21* 15 26*  CREATININE  --  1.19* 1.07* 1.20*  CALCIUM  --  8.7* 8.8* 8.9  MG 2.0  --  1.8  --    Liver Function Tests:  Recent Labs  07/09/17 1352 07/11/17 0637  AST 43* 54*  ALT 32 50  ALKPHOS 39 46  BILITOT 1.0 1.1  PROT 8.3* 7.9  ALBUMIN 3.5 3.3*   No results for input(s): LIPASE, AMYLASE in the last 8760 hours. No results for input(s): AMMONIA in the last 8760 hours. CBC:  Recent Labs  07/10/17 0436 07/11/17 0637 07/13/17 0637 07/14/17 0615  WBC 5.8 5.9 5.0 5.2  NEUTROABS 4.0 4.0  --  3.0  HGB 9.6* 10.0* 9.1* 9.4*  HCT 28.5* 29.7* 27.7* 27.7*  MCV 91.3 90.3 90.8 90.5  PLT 69* 83* 80* 98*   Cardiac Enzymes:  Recent Labs  07/09/17 1659 07/09/17 2244 07/10/17 0436  TROPONINI 0.08* 0.09* 0.09*   BNP: Invalid input(s): POCBNP No results found for: HGBA1C No results found for: TSH No results found for: VITAMINB12 No results found for: FOLATE No results found for: IRON, TIBC, FERRITIN  Imaging and Procedures obtained prior to SNF admission: No results found.  Assessment/Plan There are no diagnoses linked to this encounter.   Family/ staff Communication:   Labs/tests ordered:    This encounter was created in error - please disregard.

## 2017-07-15 NOTE — Assessment & Plan Note (Signed)
07/15/17 Vicodin 30-60 minutes prior to PT/OT to decrease pain with mobilization and enhance participation

## 2017-07-15 NOTE — Progress Notes (Signed)
This is a comprehensive admission note to [facility] performed on this date less than 30 days from date of admission. Included are preadmission medical/surgical history;reconciled medication list; family history; social history and comprehensive review of systems.  Corrections and additions to the records were documented . Comprehensive physical exam was also performed. Additionally a clinical summary was entered for each active diagnosis pertinent to this admission in the Problem List to enhance continuity of care.  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 The history suggested an orthostatic event in the context of UTI with poor fluid intake. She denies any cardiac or neurologic prodrome. She simply had passed out a few seconds after standing up.  D-dimer was elevated. VQ scan revealed low probability for PTE & negative venous Dopplers for DVT. Essentially normal carotid Dopplers.  She was found to have an elevated troponin which peaked at 0.09. There were no acute EKG changes and echo revealed grade 1 diastolic dysfunction.. This is felt to be related to demand ischemia. The urinary tract infection was pansensitive and she received a 5 day course of Keflex. Vicodin as needed was prescribed for pain control for the 50% lumbar compression fracture. Placement in the SNF was recommended because of difficulty transferring due to pain.She exhibited thrombocytopenia which was attributed to Revlimid which she takes for multiple myeloma. She had mild acute kidney injury which was felt to be prerenal. This resolved with IV fluids. She also had hypokalemia which was resolved.  Past medical and surgical history:Includes diabetes on diet and oral medicines. At the SNF glucoses have ranged from 101-114. No A1c is found in  Epic. She also has essential hypertension and hypothyroidism. She has a history of lymphoma which required craniotomy. She's also had a hysterectomy.  Social history:Nonsmoker; nondrinker  Family history:Reviewed and updated  Review of systems: Stool is dark because she is on iron. She has occasional dysuria. She also has urgency, excess urination, and incontinence. She describes slight anxiety.  Glucoses at home are less than 100. She denies any hypoglycemia.  Constitutional: No fever,significant weight change, fatigue  Eyes: No redness, discharge, pain, vision change ENT/mouth: No nasal congestion,  purulent discharge, earache,change in hearing ,sore throat  Cardiovascular: No chest pain, palpitations,paroxysmal nocturnal dyspnea, claudication, edema  Respiratory: No cough, sputum production,hemoptysis, DOE , significant snoring,apnea  Gastrointestinal: No heartburn,dysphagia,abdominal pain, nausea / vomiting,rectal bleeding, melena Genitourinary: No dysuria,hematuria, pyuria,  nocturia Musculoskeletal: No joint stiffness, joint swelling, weakness Dermatologic: No rash, pruritus, change in appearance of skin Neurologic: No dizziness,headache,syncope, seizures, numbness , tingling Psychiatric: No significant depression, insomnia, anorexia Endocrine: No change in hair/skin/ nails, excessive thirst, excessive hunger Hematologic/lymphatic: No significant bruising, lymphadenopathy,abnormal bleeding Allergy/immunology: No itchy/ watery eyes, significant sneezing, urticaria, angioedema  Physical exam:  Pertinent or positive findings:The patient is articulate and communicative. There is slight exotropia of the right eye. She has an upper dental plate. There is very slight asymmetry of the nasolabial folds. S4 is present. Pedal pulses are palpable but decreased. Slight clubbing of the nailbeds. She has slight weakness of the upper extremities which is symmetric.   General appearance:Adequately  nourished; no acute distress , increased work of breathing is present.   Lymphatic: No lymphadenopathy about the head, neck, axilla . Eyes: No conjunctival inflammation or lid edema is present. There is no scleral icterus. Ears:  External ear exam shows no significant lesions or deformities.   Nose:  External nasal examination shows no deformity or inflammation. Nasal mucosa are pink and moist without lesions ,exudates Oral exam: lips and gums are healthy appearing.There is no oropharyngeal erythema or exudate . Neck:  No thyromegaly, masses, tenderness noted.    Heart:  Normal rate and regular rhythm without gallop, murmur, click, rub .  Lungs:Chest clear to auscultation without wheezes, rhonchi,rales , rubs. Abdomen:Bowel sounds are normal. Abdomen is soft and nontender with no organomegaly, hernias,masses. GU: deferred  Extremities:  No cyanosis, edema  Neurologic exam : Balance,Rhomberg,finger to nose testing could not be completed due to clinical state Deep tendon reflexes are equal Skin: Warm & dry w/o tenting. No significant lesions or rash.  See clinical summary under each active problem in the Problem List with associated updated therapeutic plan

## 2017-07-15 NOTE — Assessment & Plan Note (Signed)
Defer to her PCP, Dr.Shah He will be notified of these findings

## 2017-07-15 NOTE — Assessment & Plan Note (Signed)
To complete 5 days total of Keflex

## 2017-07-15 NOTE — Assessment & Plan Note (Signed)
Perform isometric exercise of calves  ( while seated go up on toes to count of 5 & then onto heels for 5 count). Repeat  4- 5 times prior to standing if you've been seated or supine for any significant period of time as BP drops with such positions.  

## 2017-07-15 NOTE — Patient Instructions (Addendum)
See assessment and plan under each diagnosis in the problem list and acutely for this visit. I shall notify her PCP, Dr. Manuella Ghazi of her memory deficits documented on the BCAT and my concerns of potential adverse reaction with the ARB, losartan

## 2017-07-15 NOTE — Assessment & Plan Note (Signed)
Up-to-date A1c to assess diabetes status

## 2017-07-16 ENCOUNTER — Encounter (HOSPITAL_COMMUNITY)
Admission: RE | Admit: 2017-07-16 | Discharge: 2017-07-16 | Disposition: A | Discharge status: Home / Self Care | Payer: Medicare PPO | Source: Skilled Nursing Facility | Attending: Internal Medicine | Admitting: Internal Medicine

## 2017-07-16 DIAGNOSIS — R55 Syncope and collapse: Secondary | ICD-10-CM | POA: Insufficient documentation

## 2017-07-16 DIAGNOSIS — Z9181 History of falling: Secondary | ICD-10-CM | POA: Insufficient documentation

## 2017-07-16 DIAGNOSIS — I10 Essential (primary) hypertension: Secondary | ICD-10-CM | POA: Insufficient documentation

## 2017-07-16 DIAGNOSIS — D509 Iron deficiency anemia, unspecified: Secondary | ICD-10-CM | POA: Insufficient documentation

## 2017-07-16 DIAGNOSIS — S32010D Wedge compression fracture of first lumbar vertebra, subsequent encounter for fracture with routine healing: Secondary | ICD-10-CM | POA: Insufficient documentation

## 2017-07-16 DIAGNOSIS — Z905 Acquired absence of kidney: Secondary | ICD-10-CM | POA: Insufficient documentation

## 2017-07-16 LAB — CBC WITH DIFFERENTIAL/PLATELET
Basophils Absolute: 0 10*3/uL (ref 0.0–0.1)
Basophils Relative: 1 %
EOS ABS: 0.3 10*3/uL (ref 0.0–0.7)
EOS PCT: 6 %
HCT: 26.7 % — ABNORMAL LOW (ref 36.0–46.0)
Hemoglobin: 8.8 g/dL — ABNORMAL LOW (ref 12.0–15.0)
LYMPHS ABS: 1.2 10*3/uL (ref 0.7–4.0)
Lymphocytes Relative: 25 %
MCH: 30.9 pg (ref 26.0–34.0)
MCHC: 33 g/dL (ref 30.0–36.0)
MCV: 93.7 fL (ref 78.0–100.0)
MONO ABS: 0.5 10*3/uL (ref 0.1–1.0)
MONOS PCT: 11 %
Neutro Abs: 2.8 10*3/uL (ref 1.7–7.7)
Neutrophils Relative %: 59 %
PLATELETS: 124 10*3/uL — AB (ref 150–400)
RBC: 2.85 MIL/uL — ABNORMAL LOW (ref 3.87–5.11)
RDW: 17 % — ABNORMAL HIGH (ref 11.5–15.5)
WBC: 4.8 10*3/uL (ref 4.0–10.5)

## 2017-07-16 LAB — COMPREHENSIVE METABOLIC PANEL
ALT: 38 U/L (ref 14–54)
AST: 35 U/L (ref 15–41)
Albumin: 2.9 g/dL — ABNORMAL LOW (ref 3.5–5.0)
Alkaline Phosphatase: 55 U/L (ref 38–126)
Anion gap: 7 (ref 5–15)
BILIRUBIN TOTAL: 0.6 mg/dL (ref 0.3–1.2)
BUN: 27 mg/dL — ABNORMAL HIGH (ref 6–20)
CHLORIDE: 109 mmol/L (ref 101–111)
CO2: 23 mmol/L (ref 22–32)
CREATININE: 1.48 mg/dL — AB (ref 0.44–1.00)
Calcium: 9 mg/dL (ref 8.9–10.3)
GFR, EST AFRICAN AMERICAN: 40 mL/min — AB (ref >60)
GFR, EST NON AFRICAN AMERICAN: 34 mL/min — AB (ref >60)
Glucose, Bld: 96 mg/dL (ref 65–99)
POTASSIUM: 3.9 mmol/L (ref 3.5–5.1)
Sodium: 139 mmol/L (ref 135–145)
TOTAL PROTEIN: 7.6 g/dL (ref 6.5–8.1)

## 2017-07-16 LAB — TSH: TSH: 1.88 u[IU]/mL (ref 0.350–4.500)

## 2017-07-17 ENCOUNTER — Encounter (HOSPITAL_COMMUNITY)
Admission: RE | Admit: 2017-07-17 | Discharge: 2017-07-17 | Disposition: A | Discharge status: Home / Self Care | Payer: Medicare PPO | Source: Skilled Nursing Facility | Attending: *Deleted | Admitting: *Deleted

## 2017-07-17 LAB — CBC WITH DIFFERENTIAL/PLATELET
BASOS ABS: 0.1 10*3/uL (ref 0.0–0.1)
Basophils Relative: 1 %
Eosinophils Absolute: 0.3 10*3/uL (ref 0.0–0.7)
Eosinophils Relative: 8 %
HEMATOCRIT: 28.1 % — AB (ref 36.0–46.0)
HEMOGLOBIN: 9.5 g/dL — AB (ref 12.0–15.0)
LYMPHS PCT: 28 %
Lymphs Abs: 1.3 10*3/uL (ref 0.7–4.0)
MCH: 30.7 pg (ref 26.0–34.0)
MCHC: 33.8 g/dL (ref 30.0–36.0)
MCV: 90.9 fL (ref 78.0–100.0)
MONO ABS: 0.4 10*3/uL (ref 0.1–1.0)
Monocytes Relative: 10 %
Neutro Abs: 2.4 10*3/uL (ref 1.7–7.7)
Neutrophils Relative %: 53 %
Platelets: 145 10*3/uL — ABNORMAL LOW (ref 150–400)
RBC: 3.09 MIL/uL — AB (ref 3.87–5.11)
RDW: 17.1 % — AB (ref 11.5–15.5)
WBC: 4.4 10*3/uL (ref 4.0–10.5)

## 2017-07-17 LAB — COMPREHENSIVE METABOLIC PANEL
ALK PHOS: 60 U/L (ref 38–126)
ALT: 38 U/L (ref 14–54)
AST: 41 U/L (ref 15–41)
Albumin: 3 g/dL — ABNORMAL LOW (ref 3.5–5.0)
Anion gap: 8 (ref 5–15)
BILIRUBIN TOTAL: 0.6 mg/dL (ref 0.3–1.2)
BUN: 27 mg/dL — AB (ref 6–20)
CALCIUM: 8.9 mg/dL (ref 8.9–10.3)
CO2: 22 mmol/L (ref 22–32)
CREATININE: 1.36 mg/dL — AB (ref 0.44–1.00)
Chloride: 109 mmol/L (ref 101–111)
GFR calc Af Amer: 44 mL/min — ABNORMAL LOW (ref >60)
GFR, EST NON AFRICAN AMERICAN: 38 mL/min — AB (ref >60)
Glucose, Bld: 118 mg/dL — ABNORMAL HIGH (ref 65–99)
Potassium: 4 mmol/L (ref 3.5–5.1)
Sodium: 139 mmol/L (ref 135–145)
Total Protein: 8 g/dL (ref 6.5–8.1)

## 2017-07-17 LAB — HEMOGLOBIN A1C
Hgb A1c MFr Bld: 5.2 % (ref 4.8–5.6)
Mean Plasma Glucose: 103 mg/dL

## 2017-07-20 ENCOUNTER — Encounter (HOSPITAL_COMMUNITY)
Admission: RE | Admit: 2017-07-20 | Discharge: 2017-07-20 | Disposition: A | Discharge status: Home / Self Care | Payer: Medicare PPO | Source: Skilled Nursing Facility | Attending: *Deleted | Admitting: *Deleted

## 2017-07-20 LAB — BASIC METABOLIC PANEL
Anion gap: 7 (ref 5–15)
BUN: 19 mg/dL (ref 6–20)
CHLORIDE: 112 mmol/L — AB (ref 101–111)
CO2: 24 mmol/L (ref 22–32)
CREATININE: 1.33 mg/dL — AB (ref 0.44–1.00)
Calcium: 8.9 mg/dL (ref 8.9–10.3)
GFR calc Af Amer: 45 mL/min — ABNORMAL LOW (ref >60)
GFR calc non Af Amer: 39 mL/min — ABNORMAL LOW (ref >60)
Glucose, Bld: 128 mg/dL — ABNORMAL HIGH (ref 65–99)
Potassium: 3.6 mmol/L (ref 3.5–5.1)
Sodium: 143 mmol/L (ref 135–145)

## 2017-07-23 ENCOUNTER — Encounter (HOSPITAL_COMMUNITY)
Admission: RE | Admit: 2017-07-23 | Discharge: 2017-07-23 | Disposition: A | Discharge status: Home / Self Care | Payer: Medicare PPO | Source: Skilled Nursing Facility | Attending: *Deleted | Admitting: *Deleted

## 2017-07-23 LAB — BASIC METABOLIC PANEL
Anion gap: 8 (ref 5–15)
BUN: 25 mg/dL — ABNORMAL HIGH (ref 6–20)
CALCIUM: 9 mg/dL (ref 8.9–10.3)
CO2: 23 mmol/L (ref 22–32)
Chloride: 110 mmol/L (ref 101–111)
Creatinine, Ser: 1.49 mg/dL — ABNORMAL HIGH (ref 0.44–1.00)
GFR calc Af Amer: 39 mL/min — ABNORMAL LOW (ref >60)
GFR, EST NON AFRICAN AMERICAN: 34 mL/min — AB (ref >60)
GLUCOSE: 84 mg/dL (ref 65–99)
POTASSIUM: 4.1 mmol/L (ref 3.5–5.1)
Sodium: 141 mmol/L (ref 135–145)

## 2017-07-24 ENCOUNTER — Encounter: Payer: Self-pay | Admitting: Internal Medicine

## 2017-07-24 NOTE — Progress Notes (Signed)
Location:   [facility] Room Number: 116/D Place of Service:  SNF [phone]) Provider:  Providence Crosby, MD  Patient Care Team: Vedia Coffer, MD as PCP - General (Internal Medicine)  Extended Emergency Contact Information Primary Emergency Contact: Minerva Ends Address: [address], [place] Phone: [phone] Relation: Sister Secondary Emergency Contact: Maryann Alar Address: [address]          [place], [place] [address] [place] Phone: [phone] Relation: Sister  Code Status:  DNR Goals of care: Advanced Directive information Advanced Directives 07/24/2017  Does Patient Have a Medical Advance Directive? Yes  Type of Advance Directive Out of facility DNR (pink MOST or yellow form)  Does patient want to make changes to medical advance directive? No - Patient declined  Would patient like information on creating a medical advance directive? -     Chief Complaint  Patient presents with  . Acute Visit    F/U Renal Insufficiency    HPI:  Pt is a 71 y.o. female seen today for an acute visit for    Past Medical History:  Diagnosis Date  . Benign hypertension   . Cataract   . 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 Diabetes mellitus without complication ([place] [facility])   . H/O partial nephrectomy   . Hypokalemia   . Hypothyroidism   . Impaired cognition   . Multiple myeloma ([facility])    Dr Maylon Peppers, [facility]  . Thyroid disease    Past Surgical History:  Procedure Laterality Date  . ABDOMINAL HYSTERECTOMY    . CRANIOTOMY     for lymphoma  . YAG LASER APPLICATION Right 1/66/0600   Procedure: YAG LASER APPLICATION;  Surgeon: [name], MD;  Location: [facility];  Service: Ophthalmology;  Laterality: Right;    Allergies  Allergen Reactions  . Lotensin [Benazepril Hcl]     Facial swelling (Angioedema) Note: ARBs should also be contraindicated, but she is on Losartan!     Outpatient Encounter Prescriptions as of 07/24/2017  Medication Sig  . acetaminophen (TYLENOL) 500 MG tablet Take 1,000 mg by mouth every 6 (six) hours as needed for mild pain.   Marland Kitchen amLODipine (NORVASC) 5 MG tablet Take 5 mg by mouth daily.   . diphenhydramine-acetaminophen (TYLENOL PM) 25-500 MG TABS Take 1 tablet by mouth at bedtime as needed (sleep/pain).   . ferrous sulfate 325 (65 FE) MG tablet Take 325 mg by mouth daily with breakfast.  . HYDROcodone-acetaminophen (NORCO/VICODIN) 5-325 MG tablet Take 1 tablet by mouth every 4 (four) hours as needed.  Marland Kitchen lenalidomide (REVLIMID) 15 MG capsule Take 15 mg by mouth daily. For two weeks then off for three weeks. Then follow up doctor  . levothyroxine (SYNTHROID, LEVOTHROID) 25 MCG tablet Take 25 mcg by mouth daily before breakfast.  . losartan (COZAAR) 100 MG tablet Take 100 mg by mouth daily.  . metFORMIN (GLUCOPHAGE) 500 MG tablet Take 250 mg by mouth every other day.  . Multiple Vitamins-Minerals (CENTRUM ADULTS PO) Take 1 tablet by mouth daily.  . potassium chloride (K-DUR) 10 MEQ tablet Take 30 mEq by mouth daily.   No facility-administered encounter medications on file as of 07/24/2017.     Review of Systems  Immunization History  Administered Date(s) Administered  . Influenza-Unspecified 09/25/2011, 10/14/2012, 10/20/2013, 09/21/2014, 11/15/2015, 10/16/2016  . Pneumococcal Polysaccharide-23 07/10/2017  . Pneumococcal-Unspecified 11/20/2010  .  Tdap 10/14/2012, 08/26/2015   Pertinent  Health Maintenance Due  Topic Date Due  . FOOT EXAM  09/13/2017 (Originally 08/28/1956)  . MAMMOGRAM  09/13/2017 (Originally 08/28/1994)  . OPHTHALMOLOGY EXAM  09/13/2017 (Originally 08/28/1956)  . URINE MICROALBUMIN  09/13/2017 (Originally 08/28/1956)  . DEXA SCAN  09/13/2017 (Originally 08/28/2009)  . COLONOSCOPY  09/13/2017 (Originally 08/28/1994)  . INFLUENZA VACCINE  11/14/2017 (Originally 07/15/2017)  . HEMOGLOBIN A1C  01/16/2018  . PNA vac Low  Risk Adult (2 of 2 - PCV13) 07/10/2018   No flowsheet data found. Functional Status Survey:    Vitals:   07/24/17 1205  BP: 116/66  Pulse: 88  Resp: 19  Temp: 98.1 F (36.7 C)  TempSrc: Oral   There is no height or weight on file to calculate BMI. Physical Exam  Labs reviewed:  Recent Labs  07/09/17 1659  07/11/17 0637  07/17/17 0900 07/20/17 0700 07/23/17 0420  NA  --   < > 139  < > 139 143 141  K  --   < > 3.3*  < > 4.0 3.6 4.1  CL  --   < > 106  < > 109 112* 110  CO2  --   < > 25  < > _0 GLUCOSE  --   < > 106*  < > 118* 128* 84  BUN  --   < > 15  < > 27* 19 25*  CREATININE  --   < > 1.07*  < > 1.36* 1.33* 1.49*  CALCIUM  --   < > 8.8*  < > 8.9 8.9 9.0  MG 2.0  --  1.8  --   --   --   --   < > = values in this interval not displayed.  Recent Labs  07/11/17 0637 07/16/17 0700 07/17/17 0900  AST 54* 35 41  ALT 50 38 38  ALKPHOS 46 55 60  BILITOT 1.1 0.6 0.6  PROT 7.9 7.6 8.0  ALBUMIN 3.3* 2.9* 3.0*    Recent Labs  07/14/17 0615 07/16/17 0700 07/17/17 0900  WBC 5.2 4.8 4.4  NEUTROABS 3.0 2.8 2.4  HGB 9.4* 8.8* 9.5*  HCT 27.7* 26.7* 28.1*  MCV 90.5 93.7 90.9  PLT 98* 124* 145*   Lab Results  Component Value Date   TSH 1.880 07/16/2017   Lab Results  Component Value Date   HGBA1C 5.2 07/16/2017   No results found for: CHOL, HDL, LDLCALC, LDLDIRECT, TRIG, CHOLHDL  Significant Diagnostic Results in last 30 days:  Dg Lumbar Spine Complete  Result Date: 07/09/2017 CLINICAL DATA:  Pain of the pelvis and lower back extending into bilateral hips. EXAM: LUMBAR SPINE - COMPLETE 4+ VIEW COMPARISON:  Chest x-ray January 19, 2011 FINDINGS: There is 50% compression deformity of L1, new since prior chest x-ray of February 2012. The chronicity is indeterminate. If the patient has focal pain here, acute fracture is not excluded. Degenerative joint changes at L5-S1 are noted. There is scoliosis of spine. IMPRESSION: There is 50% compression deformity of  L1, new since prior chest x-ray of February 2012. The chronicity is indeterminate. If the patient has focal pain here, acute fracture is not excluded. Electronically Signed   By: Abelardo Diesel M.D.   On: 07/09/2017 15:08   Ct Head Wo Contrast  Result Date: 07/09/2017 CLINICAL DATA:  Syncopal episode yesterday EXAM: CT HEAD WITHOUT CONTRAST TECHNIQUE: Contiguous axial images were obtained from the base of the skull through the vertex without intravenous  contrast. COMPARISON:  08/26/2015 FINDINGS: Brain: Bifrontal encephalomalacia changes are again seen and stable. No findings to suggest acute hemorrhage, acute infarction or space-occupying mass lesion are noted. Small calcification is noted just to the left of the sella turcica likely related to a small meningioma. It is stable in appearance. Vascular: No hyperdense vessel or unexpected calcification. Skull: Postsurgical changes are seen. A lucent lesion is noted within the right parietal bone stable from the prior exam Sinuses/Orbits: No acute finding. Other: Mild scalp hematoma is noted in the left parietal region likely related to recent injury. IMPRESSION: Chronic changes similar to that noted on the prior exam. No acute intracranial abnormality is noted. Small scalp hematoma is noted on the left. Electronically Signed   By: [name] M.D.   On: 07/09/2017 14:51   US Carotid Bilateral  Result Date: 07/10/2017 CLINICAL DATA:  71 year old female with a history of syncope. Cardiovascular risk factors include hypertension, diabetes EXAM: BILATERAL CAROTID DUPLEX ULTRASOUND TECHNIQUE: Pearline Cables scale imaging, color Doppler and duplex ultrasound were performed of bilateral carotid and vertebral arteries in the neck. COMPARISON:  No prior duplex FINDINGS: Criteria: Quantification of carotid stenosis is based on velocity parameters that correlate the residual internal carotid diameter with NASCET-based stenosis levels, using the diameter of the distal internal  carotid lumen as the denominator for stenosis measurement. The following velocity measurements were obtained: RIGHT ICA:  Systolic 80 cm/sec, Diastolic 30 cm/sec CCA:  53 cm/sec SYSTOLIC ICA/CCA RATIO:  1.5 ECA:  54 cm/sec LEFT ICA:  Systolic 83 cm/sec, Diastolic 26 cm/sec CCA:  62 cm/sec SYSTOLIC ICA/CCA RATIO:  1.3 ECA:  61 cm/sec Right Brachial SBP: Not acquired Left Brachial SBP: Not acquired RIGHT CAROTID ARTERY: No significant calcified disease of the right common carotid artery. Intermediate waveform maintained. Mild plaque without significant calcifications at the right carotid bifurcation. Low resistance waveform of the right ICA. No significant tortuosity. RIGHT VERTEBRAL ARTERY: Antegrade flow with low resistance waveform. LEFT CAROTID ARTERY: No significant calcified disease of the left common carotid artery. Intermediate waveform maintained. Mild plaque at the left carotid bifurcation without significant calcifications. Low resistance waveform of the left ICA. LEFT VERTEBRAL ARTERY:  Antegrade flow with low resistance waveform. IMPRESSION: Color duplex indicates minimal homogeneous plaque, with no hemodynamically significant stenosis by duplex criteria in the extracranial cerebrovascular circulation. Signed, Dulcy Fanny. Earleen Newport, DO [facility] Electronically Signed   By: Corrie Mckusick D.O.   On: 07/10/2017 10:16   Nm Pulmonary Perf And Vent  Result Date: 07/11/2017 CLINICAL DATA:  Shortness of breath. EXAM: NUCLEAR MEDICINE VENTILATION - PERFUSION LUNG SCAN TECHNIQUE: Ventilation images were obtained in multiple projections using inhaled aerosol Tc-1mDTPA. Perfusion images were obtained in multiple projections after intravenous injection of Tc-971mAA. RADIOPHARMACEUTICALS:  Thirty mCi Technetium-9949mPA aerosol inhalation and 4.1 mCi Technetium-70m68m IV COMPARISON:  None. FINDINGS: Ventilation: There is a small ventilation defect  identified along the left lung base. Perfusion: No unmatched wedge shaped peripheral perfusion defects to suggest acute pulmonary embolism. Small perfusion defect is identified along the left base which corresponds with the ventilation abnormality. IMPRESSION: Very low probability for acute pulmonary embolus. Electronically Signed   By: TaylKerby Moors.   On: 07/11/2017 14:39   Us VKoreaous Img Lower Bilateral  Result Date: 07/11/2017 CLINICAL DATA:  Bilateral lower extremity edema, elevated D-dimer EXAM: BILATERAL LOWER EXTREMITY VENOUS DOPPLER ULTRASOUND TECHNIQUE: Gray-scale sonography with graded compression, as well as color Doppler and duplex ultrasound were performed  to evaluate the lower extremity deep venous systems from the level of the common femoral vein and including the common femoral, femoral, profunda femoral, popliteal and calf veins including the posterior tibial, peroneal and gastrocnemius veins when visible. The superficial great saphenous vein was also interrogated. Spectral Doppler was utilized to evaluate flow at rest and with distal augmentation maneuvers in the common femoral, femoral and popliteal veins. COMPARISON:  None. FINDINGS: RIGHT LOWER EXTREMITY Common Femoral Vein: No evidence of thrombus. Normal compressibility, respiratory phasicity and response to augmentation. Saphenofemoral Junction: No evidence of thrombus. Normal compressibility and flow on color Doppler imaging. Profunda Femoral Vein: No evidence of thrombus. Normal compressibility and flow on color Doppler imaging. Femoral Vein: No evidence of thrombus. Normal compressibility, respiratory phasicity and response to augmentation. Popliteal Vein: No evidence of thrombus. Normal compressibility, respiratory phasicity and response to augmentation. Calf Veins: No evidence of thrombus. Normal compressibility and flow on color Doppler imaging. Superficial Great Saphenous Vein: No evidence of thrombus. Normal compressibility  and flow on color Doppler imaging. Venous Reflux:  None. Other Findings:  None. LEFT LOWER EXTREMITY Common Femoral Vein: No evidence of thrombus. Normal compressibility, respiratory phasicity and response to augmentation. Saphenofemoral Junction: No evidence of thrombus. Normal compressibility and flow on color Doppler imaging. Profunda Femoral Vein: No evidence of thrombus. Normal compressibility and flow on color Doppler imaging. Femoral Vein: No evidence of thrombus. Normal compressibility, respiratory phasicity and response to augmentation. Popliteal Vein: No evidence of thrombus. Normal compressibility, respiratory phasicity and response to augmentation. Calf Veins: No evidence of thrombus. Normal compressibility and flow on color Doppler imaging. Superficial Great Saphenous Vein: No evidence of thrombus. Normal compressibility and flow on color Doppler imaging. Venous Reflux:  None. Other Findings:  None. IMPRESSION: No evidence of DVT within either lower extremity. Electronically Signed   By: Jerilynn Mages.  Shick M.D.   On: 07/11/2017 13:47   Dg Chest Port 1 View  Result Date: 07/11/2017 CLINICAL DATA:  Patient with syncopal episode.  Hypertension. EXAM: PORTABLE CHEST 1 VIEW COMPARISON:  Chest radiograph 01/19/2011 FINDINGS: Monitoring leads overlie the patient. Stable cardiac and mediastinal contours. Aortic tortuosity. Low lung volumes. Bibasilar heterogeneous pulmonary opacities. No pleural effusion or pneumothorax. IMPRESSION: Markedly low lung volumes. Basilar opacities favored to represent atelectasis. Consider further evaluation with PA and lateral chest radiograph. Electronically Signed   By: Lovey Newcomer M.D.   On: 07/11/2017 12:34    Assessment/Plan There are no diagnoses linked to this encounter.      Oralia Manis, [place]

## 2017-07-26 NOTE — Progress Notes (Signed)
This encounter was created in error - please disregard.

## 2017-07-28 ENCOUNTER — Encounter (HOSPITAL_COMMUNITY)
Admission: RE | Admit: 2017-07-28 | Discharge: 2017-07-28 | Disposition: A | Discharge status: Home / Self Care | Payer: Medicare PPO | Source: Skilled Nursing Facility | Attending: *Deleted | Admitting: *Deleted

## 2017-07-28 LAB — BASIC METABOLIC PANEL
Anion gap: 7 (ref 5–15)
BUN: 23 mg/dL — AB (ref 6–20)
CALCIUM: 8.9 mg/dL (ref 8.9–10.3)
CO2: 23 mmol/L (ref 22–32)
Chloride: 111 mmol/L (ref 101–111)
Creatinine, Ser: 1.13 mg/dL — ABNORMAL HIGH (ref 0.44–1.00)
GFR calc Af Amer: 55 mL/min — ABNORMAL LOW (ref >60)
GFR, EST NON AFRICAN AMERICAN: 47 mL/min — AB (ref >60)
GLUCOSE: 95 mg/dL (ref 65–99)
Potassium: 3.7 mmol/L (ref 3.5–5.1)
Sodium: 141 mmol/L (ref 135–145)

## 2017-07-29 ENCOUNTER — Encounter: Payer: Self-pay | Admitting: Internal Medicine

## 2017-07-29 NOTE — Progress Notes (Signed)
Location:   [place] Room Number: 116/D Place of Service:  SNF (31)  Provider: Granville Lewis  PCP: Virgie Dad, MD Patient Care Team: Virgie Dad, MD as PCP - General (Internal Medicine)  Extended Emergency Contact Information Primary Emergency Contact: Minerva Ends Address: [address], [place] Phone: [phone] Relation: Sister Secondary Emergency Contact: Maryann Alar Address: [address] [place]          [place], [place] [address] [place] Phone: [phone] Relation: Sister  Code Status: DNR Goals of care:  Advanced Directive information Advanced Directives 07/29/2017  Does Patient Have a Medical Advance Directive? Yes  Type of Advance Directive Out of facility DNR (pink MOST or yellow form)  Does patient want to make changes to medical advance directive? No - Patient declined  Would patient like information on creating a medical advance directive? -     Allergies  Allergen Reactions  . Lotensin [Benazepril Hcl]     Facial swelling (Angioedema) Note: ARBs should also be contraindicated, but she is on Losartan!    Chief Complaint  Patient presents with  . Discharge Note    HPI:  71 y.o. female      Past Medical History:  Diagnosis Date  . Benign hypertension   . Cataract   . 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 Diabetes mellitus without complication ([place])   . H/O partial nephrectomy   . Hypokalemia   . Hypothyroidism   . Impaired cognition   . Multiple myeloma ([facility])    Dr Maylon Peppers, [facility]  . Thyroid disease     Past Surgical History:  Procedure Laterality Date  . ABDOMINAL HYSTERECTOMY    . CRANIOTOMY     for lymphoma  . YAG LASER APPLICATION Right 4/70/9628   Procedure: YAG LASER APPLICATION;  Surgeon: [name], MD;  Location: [facility];  Service: Ophthalmology;  Laterality: Right;      reports that she has never smoked. She has never  used smokeless tobacco. She reports that she does not drink alcohol or use drugs. Social History   Social History  . Marital status: Single    Spouse name: N/A  . Number of children: N/A  . Years of education: N/A   Occupational History  . Not on file.   Social History Main Topics  . Smoking status: Never Smoker  . Smokeless tobacco: Never Used  . Alcohol use No  . Drug use: No  . Sexual activity: Not on file   Other Topics Concern  . Not on file   Social History Narrative  . No narrative on file   Functional Status Survey:    Allergies  Allergen Reactions  . Lotensin [Benazepril Hcl]     Facial swelling (Angioedema) Note: ARBs should also be contraindicated, but she is on Losartan!    Pertinent  Health Maintenance Due  Topic Date Due  . FOOT EXAM  09/13/2017 (Originally 08/28/1956)  . MAMMOGRAM  09/13/2017 (Originally 08/28/1994)  . OPHTHALMOLOGY EXAM  09/13/2017 (Originally 08/28/1956)  . URINE MICROALBUMIN  09/13/2017 (Originally 08/28/1956)  . DEXA SCAN  09/13/2017 (Originally 08/28/2009)  . COLONOSCOPY  09/13/2017 (Originally 08/28/1994)  . INFLUENZA VACCINE  11/14/2017 (Originally 07/15/2017)  . HEMOGLOBIN A1C  01/16/2018  . PNA vac Low Risk Adult (2 of 2 - PCV13) 07/10/2018    Medications: Outpatient Encounter Prescriptions as of 07/29/2017  Medication Sig  .  acetaminophen (TYLENOL) 500 MG tablet Take 1,000 mg by mouth every 6 (six) hours as needed for mild pain.   Marland Kitchen amLODipine (NORVASC) 5 MG tablet Take 5 mg by mouth daily.   . diphenhydramine-acetaminophen (TYLENOL PM) 25-500 MG TABS Take 1 tablet by mouth at bedtime as needed (sleep/pain).   . ferrous sulfate 325 (65 FE) MG tablet Take 325 mg by mouth daily with breakfast.  . HYDROcodone-acetaminophen (NORCO/VICODIN) 5-325 MG tablet Take 1 tablet by mouth every 4 (four) hours as needed.  Marland Kitchen lenalidomide (REVLIMID) 15 MG capsule Take 15 mg by mouth daily. For two weeks then off for three weeks. Then follow up  doctor  . levothyroxine (SYNTHROID, LEVOTHROID) 25 MCG tablet Take 25 mcg by mouth daily before breakfast.  . losartan (COZAAR) 100 MG tablet Take 100 mg by mouth daily.  . metFORMIN (GLUCOPHAGE) 500 MG tablet Take 250 mg by mouth every other day.  . Multiple Vitamins-Minerals (CENTRUM ADULTS PO) Take 1 tablet by mouth daily.  . potassium chloride (K-DUR) 10 MEQ tablet Take 30 mEq by mouth daily.   No facility-administered encounter medications on file as of 07/29/2017.      Review of Systems  Vitals:   07/29/17 1404  BP: 123/71  Pulse: 72  Resp: 18  Temp: 98.4 F (36.9 C)  TempSrc: Oral   There is no height or weight on file to calculate BMI. Physical Exam  Labs reviewed: Basic Metabolic Panel:  Recent Labs  07/09/17 1659  07/11/17 0637  07/20/17 0700 07/23/17 0420 07/28/17 0700  NA  --   < > 139  < > 143 141 141  K  --   < > 3.3*  < > 3.6 4.1 3.7  CL  --   < > 106  < > 112* 110 111  CO2  --   < > 25  < > _0 GLUCOSE  --   < > 106*  < > 128* 84 95  BUN  --   < > 15  < > 19 25* 23*  CREATININE  --   < > 1.07*  < > 1.33* 1.49* 1.13*  CALCIUM  --   < > 8.8*  < > 8.9 9.0 8.9  MG 2.0  --  1.8  --   --   --   --   < > = values in this interval not displayed. Liver Function Tests:  Recent Labs  07/11/17 0637 07/16/17 0700 07/17/17 0900  AST 54* 35 41  ALT 50 38 38  ALKPHOS 46 55 60  BILITOT 1.1 0.6 0.6  PROT 7.9 7.6 8.0  ALBUMIN 3.3* 2.9* 3.0*   No results for input(s): LIPASE, AMYLASE in the last 8760 hours. No results for input(s): AMMONIA in the last 8760 hours. CBC:  Recent Labs  07/14/17 0615 07/16/17 0700 07/17/17 0900  WBC 5.2 4.8 4.4  NEUTROABS 3.0 2.8 2.4  HGB 9.4* 8.8* 9.5*  HCT 27.7* 26.7* 28.1*  MCV 90.5 93.7 90.9  PLT 98* 124* 145*   Cardiac Enzymes:  Recent Labs  07/09/17 1659 07/09/17 2244 07/10/17 0436  TROPONINI 0.08* 0.09* 0.09*   BNP: Invalid input(s): POCBNP CBG:  Recent Labs  07/12/17 2120 07/13/17 0731  07/13/17 1116  GLUCAP 94 91 118*    Procedures and Imaging Studies During Stay: Dg Lumbar Spine Complete  Result Date: 07/09/2017 CLINICAL DATA:  Pain of the pelvis and lower back extending into bilateral hips. EXAM: LUMBAR SPINE - COMPLETE 4+  VIEW COMPARISON:  Chest x-ray January 19, 2011 FINDINGS: There is 50% compression deformity of L1, new since prior chest x-ray of February 2012. The chronicity is indeterminate. If the patient has focal pain here, acute fracture is not excluded. Degenerative joint changes at L5-S1 are noted. There is scoliosis of spine. IMPRESSION: There is 50% compression deformity of L1, new since prior chest x-ray of February 2012. The chronicity is indeterminate. If the patient has focal pain here, acute fracture is not excluded. Electronically Signed   By: Abelardo Diesel M.D.   On: 07/09/2017 15:08   Ct Head Wo Contrast  Result Date: 07/09/2017 CLINICAL DATA:  Syncopal episode yesterday EXAM: CT HEAD WITHOUT CONTRAST TECHNIQUE: Contiguous axial images were obtained from the base of the skull through the vertex without intravenous contrast. COMPARISON:  08/26/2015 FINDINGS: Brain: Bifrontal encephalomalacia changes are again seen and stable. No findings to suggest acute hemorrhage, acute infarction or space-occupying mass lesion are noted. Small calcification is noted just to the left of the sella turcica likely related to a small meningioma. It is stable in appearance. Vascular: No hyperdense vessel or unexpected calcification. Skull: Postsurgical changes are seen. A lucent lesion is noted within the right parietal bone stable from the prior exam Sinuses/Orbits: No acute finding. Other: Mild scalp hematoma is noted in the left parietal region likely related to recent injury. IMPRESSION: Chronic changes similar to that noted on the prior exam. No acute intracranial abnormality is noted. Small scalp hematoma is noted on the left. Electronically Signed   By: [name] M.D.    On: 07/09/2017 14:51   US Carotid Bilateral  Result Date: 07/10/2017 CLINICAL DATA:  71 year old female with a history of syncope. Cardiovascular risk factors include hypertension, diabetes EXAM: BILATERAL CAROTID DUPLEX ULTRASOUND TECHNIQUE: Pearline Cables scale imaging, color Doppler and duplex ultrasound were performed of bilateral carotid and vertebral arteries in the neck. COMPARISON:  No prior duplex FINDINGS: Criteria: Quantification of carotid stenosis is based on velocity parameters that correlate the residual internal carotid diameter with NASCET-based stenosis levels, using the diameter of the distal internal carotid lumen as the denominator for stenosis measurement. The following velocity measurements were obtained: RIGHT ICA:  Systolic 80 cm/sec, Diastolic 30 cm/sec CCA:  53 cm/sec SYSTOLIC ICA/CCA RATIO:  1.5 ECA:  54 cm/sec LEFT ICA:  Systolic 83 cm/sec, Diastolic 26 cm/sec CCA:  62 cm/sec SYSTOLIC ICA/CCA RATIO:  1.3 ECA:  61 cm/sec Right Brachial SBP: Not acquired Left Brachial SBP: Not acquired RIGHT CAROTID ARTERY: No significant calcified disease of the right common carotid artery. Intermediate waveform maintained. Mild plaque without significant calcifications at the right carotid bifurcation. Low resistance waveform of the right ICA. No significant tortuosity. RIGHT VERTEBRAL ARTERY: Antegrade flow with low resistance waveform. LEFT CAROTID ARTERY: No significant calcified disease of the left common carotid artery. Intermediate waveform maintained. Mild plaque at the left carotid bifurcation without significant calcifications. Low resistance waveform of the left ICA. LEFT VERTEBRAL ARTERY:  Antegrade flow with low resistance waveform. IMPRESSION: Color duplex indicates minimal homogeneous plaque, with no hemodynamically significant stenosis by duplex criteria in the extracranial cerebrovascular circulation. Signed, Dulcy Fanny. Earleen Newport, DO [facility]  Radiology Electronically Signed   By: Corrie Mckusick D.O.   On: 07/10/2017 10:16   Nm Pulmonary Perf And Vent  Result Date: 07/11/2017 CLINICAL DATA:  Shortness of breath. EXAM: NUCLEAR MEDICINE VENTILATION - PERFUSION LUNG SCAN TECHNIQUE: Ventilation images were obtained in multiple projections using inhaled aerosol Tc-71mDTPA. Perfusion  images were obtained in multiple projections after intravenous injection of Tc-64mMAA. RADIOPHARMACEUTICALS:  Thirty mCi Technetium-985mTPA aerosol inhalation and 4.1 mCi Technetium-9912mA IV COMPARISON:  None. FINDINGS: Ventilation: There is a small ventilation defect identified along the left lung base. Perfusion: No unmatched wedge shaped peripheral perfusion defects to suggest acute pulmonary embolism. Small perfusion defect is identified along the left base which corresponds with the ventilation abnormality. IMPRESSION: Very low probability for acute pulmonary embolus. Electronically Signed   By: TayKerby MoorsD.   On: 07/11/2017 14:39   Us Koreanous Img Lower Bilateral  Result Date: 07/11/2017 CLINICAL DATA:  Bilateral lower extremity edema, elevated D-dimer EXAM: BILATERAL LOWER EXTREMITY VENOUS DOPPLER ULTRASOUND TECHNIQUE: Gray-scale sonography with graded compression, as well as color Doppler and duplex ultrasound were performed to evaluate the lower extremity deep venous systems from the level of the common femoral vein and including the common femoral, femoral, profunda femoral, popliteal and calf veins including the posterior tibial, peroneal and gastrocnemius veins when visible. The superficial great saphenous vein was also interrogated. Spectral Doppler was utilized to evaluate flow at rest and with distal augmentation maneuvers in the common femoral, femoral and popliteal veins. COMPARISON:  None. FINDINGS: RIGHT LOWER EXTREMITY Common Femoral Vein: No evidence of thrombus. Normal compressibility, respiratory phasicity and response to augmentation.  Saphenofemoral Junction: No evidence of thrombus. Normal compressibility and flow on color Doppler imaging. Profunda Femoral Vein: No evidence of thrombus. Normal compressibility and flow on color Doppler imaging. Femoral Vein: No evidence of thrombus. Normal compressibility, respiratory phasicity and response to augmentation. Popliteal Vein: No evidence of thrombus. Normal compressibility, respiratory phasicity and response to augmentation. Calf Veins: No evidence of thrombus. Normal compressibility and flow on color Doppler imaging. Superficial Great Saphenous Vein: No evidence of thrombus. Normal compressibility and flow on color Doppler imaging. Venous Reflux:  None. Other Findings:  None. LEFT LOWER EXTREMITY Common Femoral Vein: No evidence of thrombus. Normal compressibility, respiratory phasicity and response to augmentation. Saphenofemoral Junction: No evidence of thrombus. Normal compressibility and flow on color Doppler imaging. Profunda Femoral Vein: No evidence of thrombus. Normal compressibility and flow on color Doppler imaging. Femoral Vein: No evidence of thrombus. Normal compressibility, respiratory phasicity and response to augmentation. Popliteal Vein: No evidence of thrombus. Normal compressibility, respiratory phasicity and response to augmentation. Calf Veins: No evidence of thrombus. Normal compressibility and flow on color Doppler imaging. Superficial Great Saphenous Vein: No evidence of thrombus. Normal compressibility and flow on color Doppler imaging. Venous Reflux:  None. Other Findings:  None. IMPRESSION: No evidence of DVT within either lower extremity. Electronically Signed   By: M. Jerilynn MagesShick M.D.   On: 07/11/2017 13:47   Dg Chest Port 1 View  Result Date: 07/11/2017 CLINICAL DATA:  Patient with syncopal episode.  Hypertension. EXAM: PORTABLE CHEST 1 VIEW COMPARISON:  Chest radiograph 01/19/2011 FINDINGS: Monitoring leads overlie the patient. Stable cardiac and mediastinal contours.  Aortic tortuosity. Low lung volumes. Bibasilar heterogeneous pulmonary opacities. No pleural effusion or pneumothorax. IMPRESSION: Markedly low lung volumes. Basilar opacities favored to represent atelectasis. Consider further evaluation with PA and lateral chest radiograph. Electronically Signed   By: DreLovey NewcomerD.   On: 07/11/2017 12:34    Assessment/Plan:     Future labs/tests needed:

## 2017-07-30 NOTE — Progress Notes (Signed)
This encounter was created in error - please disregard.

## 2017-07-31 ENCOUNTER — Encounter: Payer: Self-pay | Admitting: Internal Medicine

## 2017-07-31 ENCOUNTER — Non-Acute Institutional Stay (SKILLED_NURSING_FACILITY): Payer: Medicare PPO | Admitting: Internal Medicine

## 2017-07-31 DIAGNOSIS — C9001 Multiple myeloma in remission: Secondary | ICD-10-CM | POA: Diagnosis not present

## 2017-07-31 DIAGNOSIS — R55 Syncope and collapse: Secondary | ICD-10-CM | POA: Diagnosis not present

## 2017-07-31 DIAGNOSIS — I10 Essential (primary) hypertension: Secondary | ICD-10-CM | POA: Diagnosis not present

## 2017-07-31 DIAGNOSIS — S32010D Wedge compression fracture of first lumbar vertebra, subsequent encounter for fracture with routine healing: Secondary | ICD-10-CM | POA: Diagnosis not present

## 2017-07-31 DIAGNOSIS — D696 Thrombocytopenia, unspecified: Secondary | ICD-10-CM

## 2017-07-31 DIAGNOSIS — B9620 Unspecified Escherichia coli [E. coli] as the cause of diseases classified elsewhere: Secondary | ICD-10-CM | POA: Diagnosis not present

## 2017-07-31 DIAGNOSIS — N390 Urinary tract infection, site not specified: Secondary | ICD-10-CM | POA: Diagnosis not present

## 2017-07-31 NOTE — Progress Notes (Signed)
Location:   [facility] Room Number: 116/D Place of Service:  SNF (31)  Provider: Granville Lewis  PCP: Virgie Dad, MD Patient Care Team: Virgie Dad, MD as PCP - General (Internal Medicine)  Extended Emergency Contact Information Primary Emergency Contact: Minerva Ends Address: [address], [place] Phone: [phone] Relation: Sister Secondary Emergency Contact: Maryann Alar Address: [address]          [place], [place] [address] [place] Phone: [phone] Relation: Sister  Code Status: DNR Goals of care:  Advanced Directive information Advanced Directives 07/31/2017  Does Patient Have a Medical Advance Directive? Yes  Type of Advance Directive Out of facility DNR (pink MOST or yellow form)  Does patient want to make changes to medical advance directive? No - Patient declined  Would patient like information on creating a medical advance directive? -     Allergies  Allergen Reactions  . Lotensin [Benazepril Hcl]     Facial swelling (Angioedema) Note: ARBs should also be contraindicated, but she is on Losartan!    Chief Complaint  Patient presents with  . Discharge Note    HPI:  71 y.o. female  seen today for discharge facility early next week.  She is here for rehabilitation and strengthening after complaining of syncope-and found to have a compression fracture of the L1 vertebrae secondary to subsequent fall.  She also has a history of multiple myeloma in remission on Revlimid-hypertension-lymphoma-type 2 diabetes as well as partial nephrectomy and hypothyroidism.  Regards to this event P this had a fairly unremarkable workup and a low suspicion for seizures-and this has not reoccurred during her stay here.  Regards lumbar compression fracture pain was controlled with Vicodin and she takes this to some extent here but not routinely.  She also completed a  course of Keflex for a possible UTI Escherichia coli is thought UTI may have contributed to the fall as well.  She is a type II diabetic on Glucophage blood sugars appear well controlled in the 90s to low 100s.  Also history of thrombocytopenia thought secondary to give Revlimid this actually appears to be improving on lab done August 3 was up to 145,000 platelet count  She also has a history of hypokalemia and is on supplementation potassium has normalized here was 3.7 on lab done on 07/28/2017.  Currently she has no complaints in fact would prefer to use Tylenol for any back pain-she lives at home with 2 of her sisters and has a brother who lives nearby-she will need continued PT and OT for strengthening and nursing support for her multiple medical issues   Past Medical History:  Diagnosis Date  . Benign hypertension   . Cataract   . 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 Diabetes mellitus without complication ([facility] [place])   . H/O partial nephrectomy   . Hypokalemia   . Hypothyroidism   . Impaired cognition   . Multiple myeloma ([facility])    Dr Maylon Peppers, [facility]  . Thyroid disease     Past Surgical History:  Procedure Laterality Date  . ABDOMINAL HYSTERECTOMY    . CRANIOTOMY     for lymphoma  . YAG LASER APPLICATION Right 07/23/9832   Procedure: YAG LASER APPLICATION;  Surgeon: [name], MD;  Location: [facility];  Service: Ophthalmology;  Laterality: Right;      reports that she has never smoked.  She has never used smokeless tobacco. She reports that she does not drink alcohol or use drugs. Social History   Social History  . Marital status: Single    Spouse name: N/A  . Number of children: N/A  . Years of education: N/A   Occupational History  . Not on file.   Social History Main Topics  . Smoking status: Never Smoker  . Smokeless tobacco: Never Used  . Alcohol use No  . Drug use: No  . Sexual activity: Not on file   Other Topics Concern  . Not on file   Social  History Narrative  . No narrative on file   Functional Status Survey:    Allergies  Allergen Reactions  . Lotensin [Benazepril Hcl]     Facial swelling (Angioedema) Note: ARBs should also be contraindicated, but she is on Losartan!    Pertinent  Health Maintenance Due  Topic Date Due  . FOOT EXAM  09/13/2017 (Originally 08/28/1956)  . MAMMOGRAM  09/13/2017 (Originally 08/28/1994)  . OPHTHALMOLOGY EXAM  09/13/2017 (Originally 08/28/1956)  . URINE MICROALBUMIN  09/13/2017 (Originally 08/28/1956)  . DEXA SCAN  09/13/2017 (Originally 08/28/2009)  . COLONOSCOPY  09/13/2017 (Originally 08/28/1994)  . INFLUENZA VACCINE  11/14/2017 (Originally 07/15/2017)  . HEMOGLOBIN A1C  01/16/2018  . PNA vac Low Risk Adult (2 of 2 - PCV13) 07/10/2018    Medications: Outpatient Encounter Prescriptions as of 07/31/2017  Medication Sig  . acetaminophen (TYLENOL) 500 MG tablet Take 1,000 mg by mouth every 6 (six) hours as needed for mild pain.   Marland Kitchen amLODipine (NORVASC) 5 MG tablet Take 5 mg by mouth daily.   . diphenhydramine-acetaminophen (TYLENOL PM) 25-500 MG TABS Take 1 tablet by mouth at bedtime as needed (sleep/pain).   . ferrous sulfate 325 (65 FE) MG tablet Take 325 mg by mouth daily with breakfast.  . HYDROcodone-acetaminophen (NORCO/VICODIN) 5-325 MG tablet Take 1 tablet by mouth every 4 (four) hours as needed.  Marland Kitchen lenalidomide (REVLIMID) 15 MG capsule Take 15 mg by mouth daily. For two weeks then off for three weeks. Then follow up doctor  . levothyroxine (SYNTHROID, LEVOTHROID) 25 MCG tablet Take 25 mcg by mouth daily before breakfast.  . losartan (COZAAR) 100 MG tablet Take 100 mg by mouth daily.  . metFORMIN (GLUCOPHAGE) 500 MG tablet Take 250 mg by mouth every other day.  . Multiple Vitamins-Minerals (CENTRUM ADULTS PO) Take 1 tablet by mouth daily.  . potassium chloride (K-DUR) 10 MEQ tablet Take 30 mEq by mouth daily.   No facility-administered encounter medications on file as of 07/31/2017.       Review of Systems   Gen. denies any fever or chills continues to be in good spirits.  Skin does not complain of rashes or itching.  Head ears eyes nose mouth and throat does not complain of r sore throat.--Says she has some chronic blurriness of her left eye  Respiratory denies cough or shortness of breath.  Cardiac does not complain of chest pain does not appear to have significant lower extremity edema.  GU does not really complain of dysuria shehas finished a course of Keflex for possible Escherichia coli UTI.  GI does not complain of abdominal discomfort nausea constipation.--Says occasionally she'll have some intermittent diarrhea that apparently is not consistent  Musculoskeletal history of lumbar compression fracture at this point pain appears to be controlled with  Occasional Vicodin but she would prefer to treat this with Tylenol  Neurologic does not at this point complain of  dizziness headache or  further syncopal episodes.--Does have lower extremity weakness has improved some  Psych continues to be in good spirits does not complain of anxiety or depressive symptoms.    Vitals:   07/31/17 1210  BP: 124/69  Pulse: 72  Resp: 18  Temp: 98.6 F (37 C)  TempSrc: Oral  Weight is stable at 132.6 pounds  Physical Exam   In general this is a pleasant somewhat thin elderly female in no distress   Her skin is warm and dry.  Eyes she has prescription lenses she does complain of some left eye blurriness which is chronic--the visual acuity appears grossly intact.  OropharynxAs a swhite film on the tongue mucous membranes moist.  Chest is clear to auscultation there is no labored breathing.  Heart is regular rate and rhythm without murmur gallop or rub she does not really have significant lower extremity edema   Her abdomen is soft nontender with positive bowel sounds.  Muscle skeletal does move all extremities 4 has gained significant  strength in her lower extremities now ambulates with a walker   Neurologic has improved strength in her lower extremities I could not really appreciate lateralizing findings her speech is clear.  Psych she is alert and oriented pleasant and appropriate  Labs reviewed:  Labs reviewed: Basic Metabolic Panel:  Recent Labs  07/09/17 1659  07/11/17 0637  07/20/17 0700 07/23/17 0420 07/28/17 0700  NA  --   < > 139  < > 143 141 141  K  --   < > 3.3*  < > 3.6 4.1 3.7  CL  --   < > 106  < > 112* 110 111  CO2  --   < > 25  < > '24 23 23  ' GLUCOSE  --   < > 106*  < > 128* 84 95  BUN  --   < > 15  < > 19 25* 23*  CREATININE  --   < > 1.07*  < > 1.33* 1.49* 1.13*  CALCIUM  --   < > 8.8*  < > 8.9 9.0 8.9  MG 2.0  --  1.8  --   --   --   --   < > = values in this interval not displayed. Liver Function Tests:  Recent Labs  07/11/17 0637 07/16/17 0700 07/17/17 0900  AST 54* 35 41  ALT 50 38 38  ALKPHOS 46 55 60  BILITOT 1.1 0.6 0.6  PROT 7.9 7.6 8.0  ALBUMIN 3.3* 2.9* 3.0*   No results for input(s): LIPASE, AMYLASE in the last 8760 hours. No results for input(s): AMMONIA in the last 8760 hours. CBC:  Recent Labs  07/14/17 0615 07/16/17 0700 07/17/17 0900  WBC 5.2 4.8 4.4  NEUTROABS 3.0 2.8 2.4  HGB 9.4* 8.8* 9.5*  HCT 27.7* 26.7* 28.1*  MCV 90.5 93.7 90.9  PLT 98* 124* 145*   Cardiac Enzymes:  Recent Labs  07/09/17 1659 07/09/17 2244 07/10/17 0436  TROPONINI 0.08* 0.09* 0.09*   BNP: Invalid input(s): POCBNP CBG:  Recent Labs  07/12/17 2120 07/13/17 0731 07/13/17 1116  GLUCAP 94 91 118*    Procedures and Imaging Studies During Stay: Dg Lumbar Spine Complete  Result Date: 07/09/2017 CLINICAL DATA:  Pain of the pelvis and lower back extending into bilateral hips. EXAM: LUMBAR SPINE - COMPLETE 4+ VIEW COMPARISON:  Chest x-ray January 19, 2011 FINDINGS: There is 50% compression deformity of L1, new since prior chest x-ray of February  2012. The chronicity  is indeterminate. If the patient has focal pain here, acute fracture is not excluded. Degenerative joint changes at L5-S1 are noted. There is scoliosis of spine. IMPRESSION: There is 50% compression deformity of L1, new since prior chest x-ray of February 2012. The chronicity is indeterminate. If the patient has focal pain here, acute fracture is not excluded. Electronically Signed   By: Abelardo Diesel M.D.   On: 07/09/2017 15:08   Ct Head Wo Contrast  Result Date: 07/09/2017 CLINICAL DATA:  Syncopal episode yesterday EXAM: CT HEAD WITHOUT CONTRAST TECHNIQUE: Contiguous axial images were obtained from the base of the skull through the vertex without intravenous contrast. COMPARISON:  08/26/2015 FINDINGS: Brain: Bifrontal encephalomalacia changes are again seen and stable. No findings to suggest acute hemorrhage, acute infarction or space-occupying mass lesion are noted. Small calcification is noted just to the left of the sella turcica likely related to a small meningioma. It is stable in appearance. Vascular: No hyperdense vessel or unexpected calcification. Skull: Postsurgical changes are seen. A lucent lesion is noted within the right parietal bone stable from the prior exam Sinuses/Orbits: No acute finding. Other: Mild scalp hematoma is noted in the left parietal region likely related to recent injury. IMPRESSION: Chronic changes similar to that noted on the prior exam. No acute intracranial abnormality is noted. Small scalp hematoma is noted on the left. Electronically Signed   By: [name] M.D.   On: 07/09/2017 14:51   US Carotid Bilateral  Result Date: 07/10/2017 CLINICAL DATA:  71 year old female with a history of syncope. Cardiovascular risk factors include hypertension, diabetes EXAM: BILATERAL CAROTID DUPLEX ULTRASOUND TECHNIQUE: Pearline Cables scale imaging, color Doppler and duplex ultrasound were performed of bilateral carotid and vertebral arteries in the neck. COMPARISON:  No prior duplex FINDINGS:  Criteria: Quantification of carotid stenosis is based on velocity parameters that correlate the residual internal carotid diameter with NASCET-based stenosis levels, using the diameter of the distal internal carotid lumen as the denominator for stenosis measurement. The following velocity measurements were obtained: RIGHT ICA:  Systolic 80 cm/sec, Diastolic 30 cm/sec CCA:  53 cm/sec SYSTOLIC ICA/CCA RATIO:  1.5 ECA:  54 cm/sec LEFT ICA:  Systolic 83 cm/sec, Diastolic 26 cm/sec CCA:  62 cm/sec SYSTOLIC ICA/CCA RATIO:  1.3 ECA:  61 cm/sec Right Brachial SBP: Not acquired Left Brachial SBP: Not acquired RIGHT CAROTID ARTERY: No significant calcified disease of the right common carotid artery. Intermediate waveform maintained. Mild plaque without significant calcifications at the right carotid bifurcation. Low resistance waveform of the right ICA. No significant tortuosity. RIGHT VERTEBRAL ARTERY: Antegrade flow with low resistance waveform. LEFT CAROTID ARTERY: No significant calcified disease of the left common carotid artery. Intermediate waveform maintained. Mild plaque at the left carotid bifurcation without significant calcifications. Low resistance waveform of the left ICA. LEFT VERTEBRAL ARTERY:  Antegrade flow with low resistance waveform. IMPRESSION: Color duplex indicates minimal homogeneous plaque, with no hemodynamically significant stenosis by duplex criteria in the extracranial cerebrovascular circulation. Signed, Dulcy Fanny. Earleen Newport, DO [facility] Electronically Signed   By: Corrie Mckusick D.O.   On: 07/10/2017 10:16   Nm Pulmonary Perf And Vent  Result Date: 07/11/2017 CLINICAL DATA:  Shortness of breath. EXAM: NUCLEAR MEDICINE VENTILATION - PERFUSION LUNG SCAN TECHNIQUE: Ventilation images were obtained in multiple projections using inhaled aerosol Tc-68mDTPA. Perfusion images were obtained in multiple projections after intravenous injection  of Tc-947mAA. RADIOPHARMACEUTICALS:  Thirty mCi Technetium-9932mPA aerosol inhalation and 4.1  mCi Technetium-52mMAA IV COMPARISON:  None. FINDINGS: Ventilation: There is a small ventilation defect identified along the left lung base. Perfusion: No unmatched wedge shaped peripheral perfusion defects to suggest acute pulmonary embolism. Small perfusion defect is identified along the left base which corresponds with the ventilation abnormality. IMPRESSION: Very low probability for acute pulmonary embolus. Electronically Signed   By: TKerby MoorsM.D.   On: 07/11/2017 14:39   UKoreaVenous Img Lower Bilateral  Result Date: 07/11/2017 CLINICAL DATA:  Bilateral lower extremity edema, elevated D-dimer EXAM: BILATERAL LOWER EXTREMITY VENOUS DOPPLER ULTRASOUND TECHNIQUE: Gray-scale sonography with graded compression, as well as color Doppler and duplex ultrasound were performed to evaluate the lower extremity deep venous systems from the level of the common femoral vein and including the common femoral, femoral, profunda femoral, popliteal and calf veins including the posterior tibial, peroneal and gastrocnemius veins when visible. The superficial great saphenous vein was also interrogated. Spectral Doppler was utilized to evaluate flow at rest and with distal augmentation maneuvers in the common femoral, femoral and popliteal veins. COMPARISON:  None. FINDINGS: RIGHT LOWER EXTREMITY Common Femoral Vein: No evidence of thrombus. Normal compressibility, respiratory phasicity and response to augmentation. Saphenofemoral Junction: No evidence of thrombus. Normal compressibility and flow on color Doppler imaging. Profunda Femoral Vein: No evidence of thrombus. Normal compressibility and flow on color Doppler imaging. Femoral Vein: No evidence of thrombus. Normal compressibility, respiratory phasicity and response to augmentation. Popliteal Vein: No evidence of thrombus. Normal compressibility, respiratory phasicity and  response to augmentation. Calf Veins: No evidence of thrombus. Normal compressibility and flow on color Doppler imaging. Superficial Great Saphenous Vein: No evidence of thrombus. Normal compressibility and flow on color Doppler imaging. Venous Reflux:  None. Other Findings:  None. LEFT LOWER EXTREMITY Common Femoral Vein: No evidence of thrombus. Normal compressibility, respiratory phasicity and response to augmentation. Saphenofemoral Junction: No evidence of thrombus. Normal compressibility and flow on color Doppler imaging. Profunda Femoral Vein: No evidence of thrombus. Normal compressibility and flow on color Doppler imaging. Femoral Vein: No evidence of thrombus. Normal compressibility, respiratory phasicity and response to augmentation. Popliteal Vein: No evidence of thrombus. Normal compressibility, respiratory phasicity and response to augmentation. Calf Veins: No evidence of thrombus. Normal compressibility and flow on color Doppler imaging. Superficial Great Saphenous Vein: No evidence of thrombus. Normal compressibility and flow on color Doppler imaging. Venous Reflux:  None. Other Findings:  None. IMPRESSION: No evidence of DVT within either lower extremity. Electronically Signed   By: MJerilynn Mages  Shick M.D.   On: 07/11/2017 13:47   Dg Chest Port 1 View  Result Date: 07/11/2017 CLINICAL DATA:  Patient with syncopal episode.  Hypertension. EXAM: PORTABLE CHEST 1 VIEW COMPARISON:  Chest radiograph 01/19/2011 FINDINGS: Monitoring leads overlie the patient. Stable cardiac and mediastinal contours. Aortic tortuosity. Low lung volumes. Bibasilar heterogeneous pulmonary opacities. No pleural effusion or pneumothorax. IMPRESSION: Markedly low lung volumes. Basilar opacities favored to represent atelectasis. Consider further evaluation with PA and lateral chest radiograph. Electronically Signed   By: DLovey NewcomerM.D.   On: 07/11/2017 12:34    Assessment/Plan:    #1 syncope of unclear etiology there've been no  further episodes possibly UTI may contributed to this at this point will monitor clinically with follow-up by primary care provider.  #2 history of lumbar compression fracture this appears to be doing well with supportive care she takes Vicodin occasionally but would prefer to take Tylenol we'll DC the Vicodin and monitor  #3 history UTI Escherichia coli she's  completed a course of Keflex does not complain of dysuria.  #4 history of diabetes type 2 this appears well controlled on Glucophage with blood sugars in the 90s to low 100s hemoglobin A1c on 07/16/2017 was 5.2. He advised on  #5 history of multiple myeloma thought to be in remission she is followed by hematology oncology is on Revlimid.  #6 history of thrombocytopenia this appears relatively stabilized on Revlimid will update this before discharge last platelet count showed improvement at 145,000.  #7 history renal insufficiency this appears stabilized with creatinine 1.13 BUN of 23 on lab done August 14-creatinine at times has run about 1.4 here at times-there is some variability but generally appears stable.  Number 8 history hypokalemia this is being supplemented last potassium was 3.7 on lab done on 07/28/2017 this appears to have stabilized.  #9-history of hypertension this appears stable on Losartan as well as Norvasc-Dr. Linna Darner did note she has a listed ARB allergy but apparently has tolerated the Losartan will defer follow-up to primary care provider since she has been stable in this regard.  #10 history of mild dementia this appears to be quite mild will defer to primary care provider for follow-up.  #11 History of hypothyroidism she continues on Synthroid TSH was within normal range at 1.88 on lab done 07/16/2017  #12 intermitted diarrhea she says this depends on the food she eats and has not been persistent-again will update her electrolytes I suspect this will be stable however-Will warrant follow up by primary care provider  if becomes more persistent  #13-question thrush on tongue-will treat with a short course of nystatin-swish and spit  Patient again will need PT and OT for strengthening as well as a home health nurse to assist with her medications and follow-up of her multiple medical issue she will be living with her 2 sisters who apparently are quite supportive as well as a nearby brother   .  She appears to be stable we will update a CBC and metabolic panel before discharge this appears to have stabilized in regards to her anemia thrombocytopenia as well as hypokalemia and renal insufficiency.  [id number] note greater than 30 minutes spent on this his chart summary-greater than 50% of time spent coordinating plan of care for numerous diagnoses

## 2017-08-03 ENCOUNTER — Encounter (HOSPITAL_COMMUNITY)
Admission: RE | Admit: 2017-08-03 | Discharge: 2017-08-03 | Disposition: A | Discharge status: Home / Self Care | Payer: Medicare PPO | Source: Skilled Nursing Facility | Attending: *Deleted | Admitting: *Deleted

## 2017-08-03 LAB — CBC WITH DIFFERENTIAL/PLATELET
Basophils Absolute: 0 10*3/uL (ref 0.0–0.1)
Basophils Relative: 1 %
EOS PCT: 5 %
Eosinophils Absolute: 0.3 10*3/uL (ref 0.0–0.7)
HCT: 29.6 % — ABNORMAL LOW (ref 36.0–46.0)
Hemoglobin: 9.7 g/dL — ABNORMAL LOW (ref 12.0–15.0)
LYMPHS ABS: 1.2 10*3/uL (ref 0.7–4.0)
Lymphocytes Relative: 27 %
MCH: 30.4 pg (ref 26.0–34.0)
MCHC: 32.8 g/dL (ref 30.0–36.0)
MCV: 92.8 fL (ref 78.0–100.0)
MONO ABS: 0.3 10*3/uL (ref 0.1–1.0)
MONOS PCT: 6 %
Neutro Abs: 2.8 10*3/uL (ref 1.7–7.7)
Neutrophils Relative %: 61 %
PLATELETS: 172 10*3/uL (ref 150–400)
RBC: 3.19 MIL/uL — AB (ref 3.87–5.11)
RDW: 17.4 % — ABNORMAL HIGH (ref 11.5–15.5)
WBC: 4.6 10*3/uL (ref 4.0–10.5)

## 2017-08-03 LAB — BASIC METABOLIC PANEL
Anion gap: 7 (ref 5–15)
BUN: 20 mg/dL (ref 6–20)
CALCIUM: 9.1 mg/dL (ref 8.9–10.3)
CO2: 24 mmol/L (ref 22–32)
Chloride: 109 mmol/L (ref 101–111)
Creatinine, Ser: 1.27 mg/dL — ABNORMAL HIGH (ref 0.44–1.00)
GFR calc Af Amer: 48 mL/min — ABNORMAL LOW (ref >60)
GFR, EST NON AFRICAN AMERICAN: 41 mL/min — AB (ref >60)
GLUCOSE: 105 mg/dL — AB (ref 65–99)
Potassium: 3.9 mmol/L (ref 3.5–5.1)
Sodium: 140 mmol/L (ref 135–145)

## 2017-08-05 ENCOUNTER — Telehealth: Payer: Self-pay

## 2017-08-05 NOTE — Telephone Encounter (Signed)
This is a patient of Lucien, who was admitted to [facility] after hospitalization. [facility] F/U is needed. Hospital discharge from [facility] on 08/03/17.

## 2017-11-10 ENCOUNTER — Inpatient Hospital Stay (HOSPITAL_COMMUNITY)
Admission: EM | Admit: 2017-11-10 | Discharge: 2017-11-12 | DRG: 683 | Disposition: A | Discharge status: Home Health | Payer: Medicare PPO | Attending: Internal Medicine | Admitting: Internal Medicine

## 2017-11-10 ENCOUNTER — Encounter (HOSPITAL_COMMUNITY): Payer: Self-pay | Admitting: Emergency Medicine

## 2017-11-10 ENCOUNTER — Emergency Department (HOSPITAL_COMMUNITY): Payer: Medicare PPO

## 2017-11-10 ENCOUNTER — Other Ambulatory Visit: Payer: Self-pay

## 2017-11-10 DIAGNOSIS — Z833 Family history of diabetes mellitus: Secondary | ICD-10-CM | POA: Diagnosis not present

## 2017-11-10 DIAGNOSIS — D649 Anemia, unspecified: Secondary | ICD-10-CM | POA: Diagnosis present

## 2017-11-10 DIAGNOSIS — Z8249 Family history of ischemic heart disease and other diseases of the circulatory system: Secondary | ICD-10-CM

## 2017-11-10 DIAGNOSIS — Z809 Family history of malignant neoplasm, unspecified: Secondary | ICD-10-CM | POA: Diagnosis not present

## 2017-11-10 DIAGNOSIS — R509 Fever, unspecified: Secondary | ICD-10-CM

## 2017-11-10 DIAGNOSIS — Z7984 Long term (current) use of oral hypoglycemic drugs: Secondary | ICD-10-CM

## 2017-11-10 DIAGNOSIS — E039 Hypothyroidism, unspecified: Secondary | ICD-10-CM | POA: Diagnosis present

## 2017-11-10 DIAGNOSIS — R64 Cachexia: Secondary | ICD-10-CM | POA: Diagnosis present

## 2017-11-10 DIAGNOSIS — R4182 Altered mental status, unspecified: Secondary | ICD-10-CM | POA: Diagnosis present

## 2017-11-10 DIAGNOSIS — Z681 Body mass index (BMI) 19 or less, adult: Secondary | ICD-10-CM | POA: Diagnosis not present

## 2017-11-10 DIAGNOSIS — Z818 Family history of other mental and behavioral disorders: Secondary | ICD-10-CM

## 2017-11-10 DIAGNOSIS — N183 Chronic kidney disease, stage 3 (moderate): Secondary | ICD-10-CM | POA: Diagnosis present

## 2017-11-10 DIAGNOSIS — B9620 Unspecified Escherichia coli [E. coli] as the cause of diseases classified elsewhere: Secondary | ICD-10-CM | POA: Diagnosis present

## 2017-11-10 DIAGNOSIS — I129 Hypertensive chronic kidney disease with stage 1 through stage 4 chronic kidney disease, or unspecified chronic kidney disease: Secondary | ICD-10-CM | POA: Diagnosis present

## 2017-11-10 DIAGNOSIS — C9000 Multiple myeloma not having achieved remission: Secondary | ICD-10-CM

## 2017-11-10 DIAGNOSIS — Z888 Allergy status to other drugs, medicaments and biological substances status: Secondary | ICD-10-CM | POA: Diagnosis not present

## 2017-11-10 DIAGNOSIS — N179 Acute kidney failure, unspecified: Principal | ICD-10-CM

## 2017-11-10 DIAGNOSIS — Z905 Acquired absence of kidney: Secondary | ICD-10-CM

## 2017-11-10 DIAGNOSIS — N390 Urinary tract infection, site not specified: Secondary | ICD-10-CM

## 2017-11-10 DIAGNOSIS — R7881 Bacteremia: Secondary | ICD-10-CM | POA: Diagnosis present

## 2017-11-10 DIAGNOSIS — Z79890 Hormone replacement therapy (postmenopausal): Secondary | ICD-10-CM

## 2017-11-10 DIAGNOSIS — R531 Weakness: Secondary | ICD-10-CM

## 2017-11-10 DIAGNOSIS — Z66 Do not resuscitate: Secondary | ICD-10-CM | POA: Diagnosis present

## 2017-11-10 DIAGNOSIS — R627 Adult failure to thrive: Secondary | ICD-10-CM

## 2017-11-10 DIAGNOSIS — E860 Dehydration: Secondary | ICD-10-CM | POA: Diagnosis present

## 2017-11-10 DIAGNOSIS — N3000 Acute cystitis without hematuria: Secondary | ICD-10-CM | POA: Diagnosis present

## 2017-11-10 DIAGNOSIS — E869 Volume depletion, unspecified: Secondary | ICD-10-CM | POA: Diagnosis not present

## 2017-11-10 DIAGNOSIS — Z90710 Acquired absence of both cervix and uterus: Secondary | ICD-10-CM

## 2017-11-10 LAB — DIFFERENTIAL
Basophils Absolute: 0 10*3/uL (ref 0.0–0.1)
Basophils Relative: 0 %
EOS ABS: 0 10*3/uL (ref 0.0–0.7)
EOS PCT: 0 %
LYMPHS ABS: 0.5 10*3/uL — AB (ref 0.7–4.0)
Lymphocytes Relative: 6 %
MONO ABS: 0.7 10*3/uL (ref 0.1–1.0)
Monocytes Relative: 9 %
NEUTROS PCT: 85 %
Neutro Abs: 7.5 10*3/uL (ref 1.7–7.7)

## 2017-11-10 LAB — BASIC METABOLIC PANEL
Anion gap: 7 (ref 5–15)
BUN: 40 mg/dL — AB (ref 6–20)
CALCIUM: 8.8 mg/dL — AB (ref 8.9–10.3)
CHLORIDE: 106 mmol/L (ref 101–111)
CO2: 22 mmol/L (ref 22–32)
CREATININE: 1.97 mg/dL — AB (ref 0.44–1.00)
GFR, EST AFRICAN AMERICAN: 28 mL/min — AB (ref >60)
GFR, EST NON AFRICAN AMERICAN: 24 mL/min — AB (ref >60)
Glucose, Bld: 148 mg/dL — ABNORMAL HIGH (ref 65–99)
Potassium: 4.6 mmol/L (ref 3.5–5.1)
SODIUM: 135 mmol/L (ref 135–145)

## 2017-11-10 LAB — URINALYSIS, ROUTINE W REFLEX MICROSCOPIC
BILIRUBIN URINE: NEGATIVE
GLUCOSE, UA: NEGATIVE mg/dL
KETONES UR: NEGATIVE mg/dL
NITRITE: NEGATIVE
PH: 6 (ref 5.0–8.0)
Protein, ur: 30 mg/dL — AB
SPECIFIC GRAVITY, URINE: 1.009 (ref 1.005–1.030)

## 2017-11-10 LAB — LACTIC ACID, PLASMA: LACTIC ACID, VENOUS: 1.2 mmol/L (ref 0.5–1.9)

## 2017-11-10 LAB — SODIUM, URINE, RANDOM: Sodium, Ur: 50 mmol/L

## 2017-11-10 LAB — GLUCOSE, CAPILLARY
Glucose-Capillary: 124 mg/dL — ABNORMAL HIGH (ref 65–99)
Glucose-Capillary: 117 mg/dL — ABNORMAL HIGH (ref 65–99)

## 2017-11-10 LAB — HEMOGLOBIN A1C
Hgb A1c MFr Bld: 5.5 % (ref 4.8–5.6)
Mean Plasma Glucose: 111.15 mg/dL

## 2017-11-10 LAB — CBC
HCT: 27.8 % — ABNORMAL LOW (ref 36.0–46.0)
Hemoglobin: 9 g/dL — ABNORMAL LOW (ref 12.0–15.0)
MCH: 29.7 pg (ref 26.0–34.0)
MCHC: 32.4 g/dL (ref 30.0–36.0)
MCV: 91.7 fL (ref 78.0–100.0)
PLATELETS: 97 10*3/uL — AB (ref 150–400)
RBC: 3.03 MIL/uL — AB (ref 3.87–5.11)
RDW: 18.7 % — AB (ref 11.5–15.5)
WBC: 8.6 10*3/uL (ref 4.0–10.5)

## 2017-11-10 LAB — HEPATIC FUNCTION PANEL
ALBUMIN: 2.2 g/dL — AB (ref 3.5–5.0)
ALK PHOS: 118 U/L (ref 38–126)
ALT: 62 U/L — ABNORMAL HIGH (ref 14–54)
AST: 59 U/L — ABNORMAL HIGH (ref 15–41)
BILIRUBIN DIRECT: 0.4 mg/dL (ref 0.1–0.5)
BILIRUBIN TOTAL: 1 mg/dL (ref 0.3–1.2)
Indirect Bilirubin: 0.6 mg/dL (ref 0.3–0.9)
Total Protein: 6.7 g/dL (ref 6.5–8.1)

## 2017-11-10 LAB — I-STAT CG4 LACTIC ACID, ED: LACTIC ACID, VENOUS: 1.28 mmol/L (ref 0.5–1.9)

## 2017-11-10 LAB — PHOSPHORUS: Phosphorus: 2.8 mg/dL (ref 2.5–4.6)

## 2017-11-10 LAB — TSH: TSH: 3.294 u[IU]/mL (ref 0.350–4.500)

## 2017-11-10 LAB — MAGNESIUM: Magnesium: 2.3 mg/dL (ref 1.7–2.4)

## 2017-11-10 MED ORDER — DEXTROSE 5 % IV SOLN
1.0000 g | INTRAVENOUS | Status: DC
  Filled 2017-11-10: qty 10

## 2017-11-10 MED ORDER — ENSURE ENLIVE PO LIQD
237.0000 mL | Freq: Two times a day (BID) | ORAL | Status: DC
  Administered 2017-11-11 – 2017-11-12 (×4): 237 mL via ORAL

## 2017-11-10 MED ORDER — DEXTROSE 5 % IV SOLN
1.0000 g | Freq: Once | INTRAVENOUS | Status: AC
  Administered 2017-11-10: 1 g via INTRAVENOUS
  Filled 2017-11-10: qty 10

## 2017-11-10 MED ORDER — INSULIN ASPART 100 UNIT/ML ~~LOC~~ SOLN: 0.0000 [IU] | Freq: Every day | SUBCUTANEOUS | Status: DC

## 2017-11-10 MED ORDER — SODIUM CHLORIDE 0.9 % IV BOLUS (SEPSIS)
2000.0000 mL | Freq: Once | INTRAVENOUS | Status: AC
  Administered 2017-11-10: 2000 mL via INTRAVENOUS

## 2017-11-10 MED ORDER — INSULIN ASPART 100 UNIT/ML ~~LOC~~ SOLN
0.0000 [IU] | Freq: Three times a day (TID) | SUBCUTANEOUS | Status: DC
  Administered 2017-11-10: 1 [IU] via SUBCUTANEOUS
  Administered 2017-11-11: 2 [IU] via SUBCUTANEOUS
  Administered 2017-11-11 – 2017-11-12 (×2): 1 [IU] via SUBCUTANEOUS

## 2017-11-10 MED ORDER — LEVOTHYROXINE SODIUM 25 MCG PO TABS
25.0000 ug | ORAL_TABLET | Freq: Every day | ORAL | Status: DC
  Administered 2017-11-11 – 2017-11-12 (×2): 25 ug via ORAL
  Filled 2017-11-10 (×2): qty 1

## 2017-11-10 MED ORDER — VANCOMYCIN HCL IN DEXTROSE 1-5 GM/200ML-% IV SOLN
1000.0000 mg | Freq: Once | INTRAVENOUS | Status: AC
  Administered 2017-11-10: 1000 mg via INTRAVENOUS
  Filled 2017-11-10: qty 200

## 2017-11-10 MED ORDER — ACETAMINOPHEN 500 MG PO TABS
1000.0000 mg | ORAL_TABLET | Freq: Once | ORAL | Status: AC
  Administered 2017-11-10: 1000 mg via ORAL
  Filled 2017-11-10: qty 2

## 2017-11-10 MED ORDER — SODIUM CHLORIDE 0.9 % IV SOLN
INTRAVENOUS | Status: DC
  Administered 2017-11-10 – 2017-11-12 (×4): via INTRAVENOUS

## 2017-11-10 MED ORDER — LENALIDOMIDE 15 MG PO CAPS: 15.0000 mg | ORAL_CAPSULE | Freq: Every day | ORAL | Status: DC

## 2017-11-10 MED ORDER — FERROUS SULFATE 325 (65 FE) MG PO TABS
325.0000 mg | ORAL_TABLET | Freq: Every day | ORAL | Status: DC
  Administered 2017-11-11 – 2017-11-12 (×2): 325 mg via ORAL
  Filled 2017-11-10 (×2): qty 1

## 2017-11-10 MED ORDER — PIPERACILLIN-TAZOBACTAM 3.375 G IVPB 30 MIN
3.3750 g | Freq: Once | INTRAVENOUS | Status: AC
Start: 2017-11-10 — End: 2017-11-10
  Administered 2017-11-10: 3.375 g via INTRAVENOUS
  Filled 2017-11-10: qty 50

## 2017-11-10 MED ORDER — ENOXAPARIN SODIUM 30 MG/0.3ML ~~LOC~~ SOLN
30.0000 mg | SUBCUTANEOUS | Status: DC
  Administered 2017-11-10 – 2017-11-12 (×3): 30 mg via SUBCUTANEOUS
  Filled 2017-11-10 (×3): qty 0.3

## 2017-11-10 MED ORDER — AMLODIPINE BESYLATE 5 MG PO TABS
5.0000 mg | ORAL_TABLET | Freq: Every day | ORAL | Status: DC
  Administered 2017-11-10 – 2017-11-12 (×3): 5 mg via ORAL
  Filled 2017-11-10 (×3): qty 1

## 2017-11-10 NOTE — ED Triage Notes (Signed)
Pt c/o generalized weakness x 2 days. Pt has multiple myeloma. [id number] per EMS.  

## 2017-11-10 NOTE — Progress Notes (Signed)
CRITICAL VALUE ALERT  Critical Value:  Second set of blood cultures (anaerobic bottle ) Positive for gram (-) rods  Date & Time Notied:  11/10/17 @ 0030  Provider Notified: Dr. Baltazar Najjar  Orders Received/Actions taken: No orders for previous page. Repaged and made aware of second set being positive. Waiting for call back/orders

## 2017-11-10 NOTE — Progress Notes (Signed)
CRITICAL VALUE ALERT  Critical Value: Blood culture (anaerobic bottle) Positive for Gram (-) rods  Date & Time Notied:  11/10/17 @ 2311  Provider Notified: Dr. Baltazar Najjar  Orders Received/Actions taken: Waiting for call back/orders

## 2017-11-10 NOTE — H&P (Signed)
History and Physical  Amanda Wu WIO:[id number] DOB: Aug 04, 1946 DOA: 11/10/2017  Referring physician: ER Physician, Dr. Roderic Palau PCP: Virgie Dad, MD  Outpatient Specialists:    Patient coming from:   Chief Complaint: Progressive weakness.  HPI: 71 year old African American Female with history of Multiple Myeloma, CKD 3, Hypothyroidism, DM, HTN, lymphoma and status partial Nephrectomy. Patient was seen alongside patient's older sister that provided most of the history. Patient is said to have become progressively weak, to the extent that it has become difficult for the patient to co operate with dress changes. Associated poor po intake, fever and confusion. Patient is volume deplete, and UA is suggestive of likely UTI. Albumin is 2.2, with abnormal ALT and AST. No headache, no neck pain, no chest pain, no SOB, no abdominal pain and no reported urinary symptoms. Serum creatinine is up from 1.27 to 1.97. Urine protein is 30.  ED Course: Blood culture sent. IV Rocephin started.  Pertinent labs: Worsening renal function and abnormal UA Imaging: independently reviewed.   Review of Systems: As documented in HPI. 12 point review of system was done.    Past Medical History:  Diagnosis Date  . Benign hypertension   . Cataract   . 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 Diabetes mellitus without complication ([place])   . H/O partial nephrectomy   . Hypokalemia   . Hypothyroidism   . Impaired cognition   . Multiple myeloma (HCC)    Dr Maylon Peppers, [facility]  . Thyroid disease     Past Surgical History:  Procedure Laterality Date  . ABDOMINAL HYSTERECTOMY    . CRANIOTOMY     for lymphoma  . YAG LASER APPLICATION Right 3/84/5364   Procedure: YAG LASER APPLICATION;  Surgeon: [name], MD;  Location: [facility];  Service: Ophthalmology;  Laterality: Right;     reports that  has never smoked. she has never used smokeless tobacco. She reports that she does not drink alcohol or use  drugs.  Allergies  Allergen Reactions  . Lotensin [Benazepril Hcl]     Facial swelling (Angioedema) Note: ARBs should also be contraindicated, but she is on Losartan!    Family History  Problem Relation Age of Onset  . Depression Mother   . Diabetes Mother   . Heart disease Father   . Cancer - Prostate Brother   . Cancer Brother   . Cancer Sister      Prior to Admission medications   Medication Sig Start Date End Date Taking? Authorizing Provider  acetaminophen (TYLENOL) 500 MG tablet Take 1,000 mg by mouth every 6 (six) hours as needed for mild pain.    Yes [provider]  amLODipine (NORVASC) 5 MG tablet Take 5 mg by mouth daily.    Yes [provider]  ferrous sulfate 325 (65 FE) MG tablet Take 325 mg by mouth daily with breakfast.   Yes [provider]  lenalidomide (REVLIMID) 15 MG capsule Take 15 mg by mouth daily. For two weeks then off for three weeks. Then follow up doctor   Yes [provider]  levothyroxine (SYNTHROID, LEVOTHROID) 25 MCG tablet Take 25 mcg by mouth daily before breakfast.   Yes [provider]  losartan (COZAAR) 100 MG tablet Take 100 mg by mouth daily.   Yes [provider]  metFORMIN (GLUCOPHAGE) 500 MG tablet Take 250 mg by mouth every other day.   Yes [provider]  Multiple Vitamins-Minerals (CENTRUM ADULTS PO) Take 1  tablet by mouth daily.   Yes [provider]  potassium chloride (K-DUR) 10 MEQ tablet Take 30 mEq by mouth daily.   Yes [provider]    Physical Exam: Vitals:   11/10/17 1130 11/10/17 1200 11/10/17 1300 11/10/17 1400  BP: 117/76 116/74 (!) 121/40 (!) 103/55  Pulse: 87 82 78 77  Resp: (!) 25 (!) 29 (!) 31 (!) 23  Temp:      TempSrc:      SpO2: 95% 98% 99% 97%  Weight:      Height:        Constitutional:  . Appears calm and comfortable. Cachectic. Weak looking Eyes:  . Pallor. No jaundice.  ENMT:  . external ears, nose appear normal.  Dry buccal mucosa Neck:  . Neck is supple. No JVD Respiratory:  . CTA bilaterally, no w/r/r.  . Respiratory effort normal. No retractions or accessory muscle use Cardiovascular:  . S1S2 . No LE extremity edema   Abdomen:  . Abdomen is soft and non tender. Organs are difficult to assess. Neurologic:  . Awake and alert. . Moves all limbs.  Wt Readings from Last 3 Encounters:  11/10/17 59 kg (130 lb)  07/14/17 59.3 kg (130 lb 12.8 oz)  07/12/17 65.4 kg (144 lb 2.9 oz)    I have personally reviewed following labs and imaging studies  Labs on Admission:  CBC: Recent Labs  Lab 11/10/17 0928 11/10/17 0937  WBC 8.6  --   NEUTROABS  --  7.5  HGB 9.0*  --   HCT 27.8*  --   MCV 91.7  --   PLT 97*  --    Basic Metabolic Panel: Recent Labs  Lab 11/10/17 0928  NA 135  K 4.6  CL 106  CO2 22  GLUCOSE 148*  BUN 40*  CREATININE 1.97*  CALCIUM 8.8*   Liver Function Tests: Recent Labs  Lab 11/10/17 0937  AST 59*  ALT 62*  ALKPHOS 118  BILITOT 1.0  PROT 6.7  ALBUMIN 2.2*   No results for input(s): LIPASE, AMYLASE in the last 168 hours. No results for input(s): AMMONIA in the last 168 hours. Coagulation Profile: No results for input(s): INR, PROTIME in the last 168 hours. Cardiac Enzymes: No results for input(s): CKTOTAL, CKMB, CKMBINDEX, TROPONINI in the last 168 hours. BNP (last 3 results) No results for input(s): PROBNP in the last 8760 hours. HbA1C: No results for input(s): HGBA1C in the last 72 hours. CBG: No results for input(s): GLUCAP in the last 168 hours. Lipid Profile: No results for input(s): CHOL, HDL, LDLCALC, TRIG, CHOLHDL, LDLDIRECT in the last 72 hours. Thyroid Function Tests: No results for input(s): TSH, T4TOTAL, FREET4, T3FREE, THYROIDAB in the last 72 hours. Anemia Panel: No results for input(s): VITAMINB12, FOLATE, FERRITIN, TIBC, IRON, RETICCTPCT in the last 72 hours. Urine analysis:    Component Value Date/Time   COLORURINE YELLOW  11/10/2017 0931   APPEARANCEUR HAZY (A) 11/10/2017 0931   LABSPEC 1.009 11/10/2017 0931   PHURINE 6.0 11/10/2017 0931   GLUCOSEU NEGATIVE 11/10/2017 0931   HGBUR SMALL (A) 11/10/2017 0931   BILIRUBINUR NEGATIVE 11/10/2017 0931   KETONESUR NEGATIVE 11/10/2017 0931   PROTEINUR 30 (A) 11/10/2017 0931   UROBILINOGEN 0.2 08/26/2015 1130   NITRITE NEGATIVE 11/10/2017 0931   LEUKOCYTESUR LARGE (A) 11/10/2017 0931   Sepsis Labs: '@LABRCNTIP' (procalcitonin:4,lacticidven:4) ) Recent Results (from the past 240 hour(s))  Blood Culture (routine x 2)     Status: None (Preliminary result)   Collection  Time: 11/10/17  9:44 AM  Result Value Ref Range Status   Specimen Description BLOOD RIGHT ANTECUBITAL  Final   Special Requests   Final    BOTTLES DRAWN AEROBIC AND ANAEROBIC Blood Culture adequate volume   Culture PENDING  Incomplete   Report Status PENDING  Incomplete  Blood Culture (routine x 2)     Status: None (Preliminary result)   Collection Time: 11/10/17  9:44 AM  Result Value Ref Range Status   Specimen Description BLOOD BLOOD LEFT HAND  Final   Special Requests   Final    BOTTLES DRAWN AEROBIC AND ANAEROBIC Blood Culture adequate volume   Culture PENDING  Incomplete   Report Status PENDING  Incomplete      Radiological Exams on Admission: Dg Chest Port 1 View  Result Date: 11/10/2017 CLINICAL DATA:  Weakness and fever.  Multiple myeloma . EXAM: PORTABLE CHEST 1 VIEW COMPARISON:  07/11/2017 .  01/19/2011. FINDINGS: Mediastinum and hilar structures are stable. Right upper mediastinal fullness consistent with prominent great vessels again noted without interim change. Heart size stable. Again noted are low lung volumes. Persistent mild right base atelectasis/infiltrate. Air complete resolution of left base atelectasis and effusion. Small lucency noted over the soft tissues of proximal right upper extremity of undetermined etiology. Follow-up right humerus series suggested for further  evaluation. IMPRESSION: 1. Again noted are low lung volumes. Persistent mild right base atelectasis/infiltrate. Interim near complete resolution of mild left base atelectasis and effusion . 2. Small lucency noted in the soft tissues of the right upper extremity of undetermined etiology. Follow-up right humerus series suggested for further evaluation . Electronically Signed   By: Marcello Moores  Register   On: 11/10/2017 10:14    EKG:   Active Problems:   Altered mental status   Assessment/Plan 1. Weakness/Fatigue 2. Failure to thrive 3. Fever 4. Multiple Myeloma 5. Possible UTI 6. Volume Depletion 7. AKI on CKD 3 8. Anemia, likely multifactorial     Admit patient  Pan culture patient  Hydrate patient cautiously  IV antibiotics  Consult physical therapy  Patient is DNR  Monitor renal function closely, and have low threshold to consult Nephrology and Oncology  Define goal of care  Low threshold to consult Palliative care team  Monitor renal function and electrolytes  DVT prophylaxis: [place] Lovenox Code Status: DNR Family Communication: Sister Disposition Plan: Depends on Hospital course   Consults called: None for now. Low threshold to consult Nephrology and Oncology if no improvement   Admission status: Inpatient    Time spent: 50 minutes  Dana Allan, MD  [facility] Pager #: [phone] 7PM-7AM contact night coverage as above   11/10/2017, 2:19 PM

## 2017-11-10 NOTE — ED Notes (Addendum)
Pt states she wants to have DNR code status.

## 2017-11-10 NOTE — ED Provider Notes (Signed)
[facility] EMERGENCY DEPARTMENT Provider Note   CSN: [id number] Arrival date & time: 11/10/17  6734     History   Chief Complaint Chief Complaint  Patient presents with  . Weakness    HPI Amanda Wu is a 71 y.o. female.  Patient presented with a few days of weakness and confusion recently.   The history is provided by the patient and a relative. No language interpreter was used.  Weakness  Primary symptoms include no focal weakness. This is a new problem. The problem has not changed since onset.There was no focality noted. The maximum temperature recorded prior to her arrival was 101 to 101.9 F. Pertinent negatives include no shortness of breath, no chest pain and no headaches. There were no medications administered prior to arrival.    Past Medical History:  Diagnosis Date  . Benign hypertension   . Cataract   . 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 Diabetes mellitus without complication ([place])   . H/O partial nephrectomy   . Hypokalemia   . Hypothyroidism   . Impaired cognition   . Multiple myeloma (HCC)    Dr Maylon Peppers, [facility]  . Thyroid disease     Patient Active Problem List   Diagnosis Date Noted  . Mild cognitive impairment with memory loss 07/15/2017  . E-coli UTI 07/13/2017  . Syncope 07/09/2017  . Multiple myeloma ([place]) 07/09/2017  . Benign hypertension 07/09/2017  . Hypothyroidism 07/09/2017  . Diabetes mellitus without complication ([place]) 19/37/9024  . Elevated troponin 07/09/2017  . Lumbar compression fracture ([place]) 07/09/2017  . Thrombocytopenia ([place]) 07/09/2017    Past Surgical History:  Procedure Laterality Date  . ABDOMINAL HYSTERECTOMY    . CRANIOTOMY     for lymphoma  . YAG LASER APPLICATION Right 0/97/3532   Procedure: YAG LASER APPLICATION;  Surgeon: [name], MD;  Location: [facility];  Service: Ophthalmology;  Laterality: Right;    OB History    No data available       Home Medications    Prior to Admission  medications   Medication Sig Start Date End Date Taking? Authorizing Provider  acetaminophen (TYLENOL) 500 MG tablet Take 1,000 mg by mouth every 6 (six) hours as needed for mild pain.    Yes [provider]  amLODipine (NORVASC) 5 MG tablet Take 5 mg by mouth daily.    Yes [provider]  ferrous sulfate 325 (65 FE) MG tablet Take 325 mg by mouth daily with breakfast.   Yes [provider]  lenalidomide (REVLIMID) 15 MG capsule Take 15 mg by mouth daily. For two weeks then off for three weeks. Then follow up doctor   Yes [provider]  levothyroxine (SYNTHROID, LEVOTHROID) 25 MCG tablet Take 25 mcg by mouth daily before breakfast.   Yes [provider]  losartan (COZAAR) 100 MG tablet Take 100 mg by mouth daily.   Yes [provider]  metFORMIN (GLUCOPHAGE) 500 MG tablet Take 250 mg by mouth every other day.   Yes [provider]  Multiple Vitamins-Minerals (CENTRUM ADULTS PO) Take 1 tablet by mouth daily.   Yes [provider]  potassium chloride (K-DUR) 10 MEQ tablet Take 30 mEq by mouth daily.   Yes [provider]    Family History Family History  Problem Relation Age of Onset  . Depression Mother   . Diabetes Mother   . Heart disease Father   . Cancer - Prostate Brother   . Cancer Brother   .  Cancer Sister     Social History Social History   Tobacco Use  . Smoking status: Never Smoker  . Smokeless tobacco: Never Used  Substance Use Topics  . Alcohol use: No  . Drug use: No     Allergies   Lotensin [benazepril hcl]   Review of Systems Review of Systems  Constitutional: Positive for fever. Negative for appetite change and fatigue.  HENT: Negative for congestion, ear discharge and sinus pressure.   Eyes: Negative for discharge.  Respiratory: Negative for cough and shortness of breath.   Cardiovascular: Negative for chest pain.  Gastrointestinal: Negative for abdominal pain and  diarrhea.  Genitourinary: Negative for frequency and hematuria.  Musculoskeletal: Negative for back pain.  Skin: Negative for rash.  Neurological: Positive for weakness. Negative for focal weakness, seizures and headaches.  Psychiatric/Behavioral: Negative for hallucinations.     Physical Exam Updated Vital Signs BP (!) 121/40   Pulse 78   Temp 98.8 F (37.1 C) (Oral)   Resp (!) 31   Ht '5\' 9"'  (1.753 m)   Wt 59 kg (130 lb)   SpO2 99%   BMI 19.20 kg/m   Physical Exam  Constitutional: She appears well-developed.  HENT:  Head: Normocephalic.  Eyes: Conjunctivae and EOM are normal. No scleral icterus.  Neck: Neck supple. No thyromegaly present.  Cardiovascular: Normal rate and regular rhythm. Exam reveals no gallop and no friction rub.  No murmur heard. Pulmonary/Chest: No stridor. She has no wheezes. She has no rales. She exhibits no tenderness.  Abdominal: She exhibits no distension. There is no tenderness. There is no rebound.  Musculoskeletal: Normal range of motion. She exhibits no edema.  Lymphadenopathy:    She has no cervical adenopathy.  Neurological: She is alert. She exhibits normal muscle tone. Coordination normal.  Patient alert and oriented to person and place but not time  Skin: No rash noted. No erythema.  Psychiatric: She has a normal mood and affect. Her behavior is normal.     ED Treatments / Results  Labs (all labs ordered are listed, but only abnormal results are displayed) Labs Reviewed  BASIC METABOLIC PANEL - Abnormal; Notable for the following components:      Result Value   Glucose, Bld 148 (*)    BUN 40 (*)    Creatinine, Ser 1.97 (*)    Calcium 8.8 (*)    GFR calc non Af Amer 24 (*)    GFR calc Af Amer 28 (*)    All other components within normal limits  CBC - Abnormal; Notable for the following components:   RBC 3.03 (*)    Hemoglobin 9.0 (*)    HCT 27.8 (*)    RDW 18.7 (*)    Platelets 97 (*)    All other components within normal  limits  URINALYSIS, ROUTINE W REFLEX MICROSCOPIC - Abnormal; Notable for the following components:   APPearance HAZY (*)    Hgb urine dipstick SMALL (*)    Protein, ur 30 (*)    Leukocytes, UA LARGE (*)    Bacteria, UA RARE (*)    Squamous Epithelial / LPF 0-5 (*)    All other components within normal limits  HEPATIC FUNCTION PANEL - Abnormal; Notable for the following components:   Albumin 2.2 (*)    AST 59 (*)    ALT 62 (*)    All other components within normal limits  DIFFERENTIAL - Abnormal; Notable for the following components:   Lymphs Abs 0.5 (*)  All other components within normal limits  CULTURE, BLOOD (ROUTINE X 2)  CULTURE, BLOOD (ROUTINE X 2)  URINE CULTURE  LACTIC ACID, PLASMA  I-STAT CG4 LACTIC ACID, ED    EKG  EKG Interpretation None       Radiology Dg Chest Port 1 View  Result Date: 11/10/2017 CLINICAL DATA:  Weakness and fever.  Multiple myeloma . EXAM: PORTABLE CHEST 1 VIEW COMPARISON:  07/11/2017 .  01/19/2011. FINDINGS: Mediastinum and hilar structures are stable. Right upper mediastinal fullness consistent with prominent great vessels again noted without interim change. Heart size stable. Again noted are low lung volumes. Persistent mild right base atelectasis/infiltrate. Air complete resolution of left base atelectasis and effusion. Small lucency noted over the soft tissues of proximal right upper extremity of undetermined etiology. Follow-up right humerus series suggested for further evaluation. IMPRESSION: 1. Again noted are low lung volumes. Persistent mild right base atelectasis/infiltrate. Interim near complete resolution of mild left base atelectasis and effusion . 2. Small lucency noted in the soft tissues of the right upper extremity of undetermined etiology. Follow-up right humerus series suggested for further evaluation . Electronically Signed   By: Marcello Moores  Register   On: 11/10/2017 10:14    Procedures Procedures (including critical care  time)  Medications Ordered in ED Medications  sodium chloride 0.9 % bolus 2,000 mL (0 mLs Intravenous Stopped 11/10/17 1115)  vancomycin (VANCOCIN) IVPB 1000 mg/200 mL premix (0 mg Intravenous Stopped 11/10/17 1219)  piperacillin-tazobactam (ZOSYN) IVPB 3.375 g (0 g Intravenous Stopped 11/10/17 1045)  acetaminophen (TYLENOL) tablet 1,000 mg (1,000 mg Oral Given 11/10/17 1013)  cefTRIAXone (ROCEPHIN) 1 g in dextrose 5 % 50 mL IVPB (1 g Intravenous New Bag/Given 11/10/17 1235)     Initial Impression / Assessment and Plan / ED Course  I have reviewed the triage vital signs and the nursing notes.  Pertinent labs & imaging results that were available during my care of the patient were reviewed by me and considered in my medical decision making (see chart for details).     Patient with a urinary tract infection.  Patient mental status has improved with the fever improving.  Patient will be placed on IV antibiotics and admitted to the hospital for observation  Final Clinical Impressions(s) / ED Diagnoses   Final diagnoses:  Acute cystitis without hematuria    ED Discharge Orders    None       Milton Ferguson, MD 11/10/17 1411

## 2017-11-11 DIAGNOSIS — B9620 Unspecified Escherichia coli [E. coli] as the cause of diseases classified elsewhere: Secondary | ICD-10-CM

## 2017-11-11 DIAGNOSIS — R7881 Bacteremia: Secondary | ICD-10-CM

## 2017-11-11 LAB — BLOOD CULTURE ID PANEL (REFLEXED)
Acinetobacter baumannii: NOT DETECTED
CANDIDA KRUSEI: NOT DETECTED
CANDIDA PARAPSILOSIS: NOT DETECTED
CARBAPENEM RESISTANCE: NOT DETECTED
Candida albicans: NOT DETECTED
Candida glabrata: NOT DETECTED
Candida tropicalis: NOT DETECTED
ENTEROBACTERIACEAE SPECIES: DETECTED — AB
ENTEROCOCCUS SPECIES: NOT DETECTED
Enterobacter cloacae complex: NOT DETECTED
Escherichia coli: DETECTED — AB
HAEMOPHILUS INFLUENZAE: NOT DETECTED
KLEBSIELLA OXYTOCA: NOT DETECTED
KLEBSIELLA PNEUMONIAE: NOT DETECTED
LISTERIA MONOCYTOGENES: NOT DETECTED
Neisseria meningitidis: NOT DETECTED
PSEUDOMONAS AERUGINOSA: NOT DETECTED
Proteus species: NOT DETECTED
SERRATIA MARCESCENS: NOT DETECTED
STAPHYLOCOCCUS AUREUS BCID: NOT DETECTED
STAPHYLOCOCCUS SPECIES: NOT DETECTED
STREPTOCOCCUS SPECIES: NOT DETECTED
Streptococcus agalactiae: NOT DETECTED
Streptococcus pneumoniae: NOT DETECTED
Streptococcus pyogenes: NOT DETECTED

## 2017-11-11 LAB — CBC
HCT: 28.2 % — ABNORMAL LOW (ref 36.0–46.0)
Hemoglobin: 9 g/dL — ABNORMAL LOW (ref 12.0–15.0)
MCH: 29.5 pg (ref 26.0–34.0)
MCHC: 31.9 g/dL (ref 30.0–36.0)
MCV: 92.5 fL (ref 78.0–100.0)
Platelets: 118 10*3/uL — ABNORMAL LOW (ref 150–400)
RBC: 3.05 MIL/uL — ABNORMAL LOW (ref 3.87–5.11)
RDW: 19 % — ABNORMAL HIGH (ref 11.5–15.5)
WBC: 7.3 10*3/uL (ref 4.0–10.5)

## 2017-11-11 LAB — COMPREHENSIVE METABOLIC PANEL
ALT: 49 U/L (ref 14–54)
AST: 46 U/L — ABNORMAL HIGH (ref 15–41)
Albumin: 2.1 g/dL — ABNORMAL LOW (ref 3.5–5.0)
Alkaline Phosphatase: 119 U/L (ref 38–126)
Anion gap: 7 (ref 5–15)
BUN: 32 mg/dL — ABNORMAL HIGH (ref 6–20)
CO2: 21 mmol/L — ABNORMAL LOW (ref 22–32)
Calcium: 8.3 mg/dL — ABNORMAL LOW (ref 8.9–10.3)
Chloride: 116 mmol/L — ABNORMAL HIGH (ref 101–111)
Creatinine, Ser: 1.46 mg/dL — ABNORMAL HIGH (ref 0.44–1.00)
GFR calc Af Amer: 40 mL/min — ABNORMAL LOW (ref >60)
GFR calc non Af Amer: 34 mL/min — ABNORMAL LOW (ref >60)
Glucose, Bld: 119 mg/dL — ABNORMAL HIGH (ref 65–99)
Potassium: 4 mmol/L (ref 3.5–5.1)
Sodium: 144 mmol/L (ref 135–145)
Total Bilirubin: 0.8 mg/dL (ref 0.3–1.2)
Total Protein: 6.9 g/dL (ref 6.5–8.1)

## 2017-11-11 LAB — GLUCOSE, CAPILLARY
Glucose-Capillary: 119 mg/dL — ABNORMAL HIGH (ref 65–99)
Glucose-Capillary: 163 mg/dL — ABNORMAL HIGH (ref 65–99)
Glucose-Capillary: 150 mg/dL — ABNORMAL HIGH (ref 65–99)
Glucose-Capillary: 152 mg/dL — ABNORMAL HIGH (ref 65–99)

## 2017-11-11 MED ORDER — ACETAMINOPHEN 325 MG PO TABS
325.0000 mg | ORAL_TABLET | Freq: Four times a day (QID) | ORAL | Status: DC | PRN
  Administered 2017-11-11: 650 mg via ORAL
  Filled 2017-11-11: qty 2

## 2017-11-11 MED ORDER — DEXTROSE 5 % IV SOLN
2.0000 g | INTRAVENOUS | Status: DC
  Administered 2017-11-11 – 2017-11-12 (×2): 2 g via INTRAVENOUS
  Filled 2017-11-11 (×3): qty 2

## 2017-11-11 NOTE — Evaluation (Signed)
Physical Therapy Evaluation Patient Details Name: Amanda Wu MRN: [id number] DOB: February 03, 1946 Today's Date: 11/11/2017   History of Present Illness  71 year old African American Female with history of Multiple Myeloma, CKD 3, Hypothyroidism, DM, HTN, lymphoma and status partial Nephrectomy. Patient was seen alongside patient's older sister that provided most of the history. Patient is said to have become progressively weak, to the extent that it has become difficult for the patient to co operate with dress changes. Associated poor po intake, fever and confusion. Patient is volume deplete, and UA is suggestive of likely UTI. Albumin is 2.2, with abnormal ALT and AST. No headache, no neck pain, no chest pain, no SOB, no abdominal pain and no reported urinary symptoms. Serum creatinine is up from 1.27 to 1.97. Urine protein is 30.  Clinical Impression  Patient received in bed, pleasant and willing to work with skilled PT services; able to state her name and her general home situation with her sister acting as her caretaker, but otherwise seems a poor historian. She does require Min assist for general bed mobility; MMT testing at [facility] reveals severe weakness likely contributing to patient's recent need for increased assistance at home. Mod assist was provided for sit to stand, Mod verbal cues to achieve full upright posture and hip extension. Patient reports RPE of 5/10 with this activity. DNT gait due to difficulty and fatigue with static standing. Due to gross weakness and deconditioning as well as concern for increased caregiver burden at home, recommend SNF at this time. Patient left in bed with all needs met, bed alarm activated and mitts reapplied (per RN instruction), and all needs met/concerns otherwise addressed.     Follow Up Recommendations SNF    Equipment Recommendations  None recommended by PT    Recommendations for Other Services       Precautions / Restrictions  Precautions Precautions: Fall Restrictions Weight Bearing Restrictions: No      Mobility  Bed Mobility Overal bed mobility: Needs Assistance Bed Mobility: Rolling;Supine to Sit;Sit to Supine Rolling: Min assist   Supine to sit: Min assist Sit to supine: Min assist      Transfers Overall transfer level: Needs assistance Equipment used: None Transfers: Sit to/from Stand Sit to Stand: Mod assist            Ambulation/Gait             General Gait Details: DNT due to difficulty and fatigue with static standing   Stairs            Wheelchair Mobility    Modified Rankin (Stroke Patients Only)       Balance Overall balance assessment: Needs assistance Sitting-balance support: No upper extremity supported Sitting balance-Leahy Scale: Good     Standing balance support: Bilateral upper extremity supported Standing balance-Leahy Scale: Fair                               Pertinent Vitals/Pain Pain Assessment: No/denies pain    Home Living Family/patient expects to be discharged to:: Private residence Living Arrangements: Other relatives(sister ) Available Help at Discharge: Family Type of Home: House Home Access: Level entry     Home Layout: One level Home Equipment: Grab bars - tub/shower Additional Comments: patient poor historian     Prior Function Level of Independence: Independent         Comments: per pateint, family not present to confirm  Hand Dominance        Extremity/Trunk Assessment   Upper Extremity Assessment Upper Extremity Assessment: Defer to OT evaluation    Lower Extremity Assessment Lower Extremity Assessment: Generalized weakness    Cervical / Trunk Assessment Cervical / Trunk Assessment: Kyphotic  Communication   Communication: No difficulties  Cognition Arousal/Alertness: Awake/alert Behavior During Therapy: WFL for tasks assessed/performed Overall Cognitive Status: Within Functional  Limits for tasks assessed                                        General Comments      Exercises     Assessment/Plan    PT Assessment Patient needs continued PT services  PT Problem List Decreased strength;Decreased mobility;Decreased safety awareness;Decreased coordination;Decreased balance;Decreased knowledge of use of DME       PT Treatment Interventions DME instruction;Therapeutic activities;Gait training;Therapeutic exercise;Patient/family education;Stair training;Balance training;Functional mobility training;Neuromuscular re-education    PT Goals (Current goals can be found in the Care Plan section)  Acute Rehab PT Goals Patient Stated Goal: to get stronger  PT Goal Formulation: With patient Time For Goal Achievement: 11/25/17 Potential to Achieve Goals: Fair    Frequency Min 3X/week   Barriers to discharge        Co-evaluation               AM-PAC PT "6 Clicks" Daily Activity  Outcome Measure Difficulty turning over in bed (including adjusting bedclothes, sheets and blankets)?: Unable Difficulty moving from lying on back to sitting on the side of the bed? : Unable Difficulty sitting down on and standing up from a chair with arms (e.g., wheelchair, bedside commode, etc,.)?: Unable Help needed moving to and from a bed to chair (including a wheelchair)?: A Lot Help needed walking in hospital room?: A Lot Help needed climbing 3-5 steps with a railing? : A Lot 6 Click Score: 9    End of Session Equipment Utilized During Treatment: Gait belt Activity Tolerance: Patient tolerated treatment well Patient left: in bed;with bed alarm set;with restraints reapplied;Other (comment)(called RN to confirm that patient needs mitts on as they were in room but off when PT arrived; per RN, mitts placed back on patient )   PT Visit Diagnosis: Unsteadiness on feet (R26.81);Other abnormalities of gait and mobility (R26.89);Muscle weakness (generalized)  (M62.81);History of falling (Z91.81);Difficulty in walking, not elsewhere classified (R26.2)    Time: 0940-1003 PT Time Calculation (min) (ACUTE ONLY): 23 min   Charges:   PT Evaluation $PT Eval Low Complexity: 1 Low PT Treatments $Self Care/Home Management: 8-22   PT G Codes:   PT G-Codes **NOT FOR INPATIENT CLASS** Functional Assessment Tool Used: AM-PAC 6 Clicks Basic Mobility;Clinical judgement Functional Limitation: Mobility: Walking and moving around Mobility: Walking and Moving Around Current Status (O0370): At least 40 percent but less than 60 percent impaired, limited or restricted Mobility: Walking and Moving Around Goal Status [phone]): At least 40 percent but less than 60 percent impaired, limited or restricted    Deniece Ree PT, DPT, CBIS  Supplemental Physical Therapist [facility]

## 2017-11-11 NOTE — Progress Notes (Signed)
Initial Nutrition Assessment  DOCUMENTATION CODES:   Not applicable  INTERVENTION:   Ensure Enlive po BID, each supplement provides 350 kcal and 20 grams of protein  NUTRITION DIAGNOSIS:   Increased nutrient needs related to chronic illness as evidenced by estimated needs.  GOAL:   Patient will meet greater than or equal to 90% of their needs  MONITOR:   PO intake, Supplement acceptance, Weight trends, I & O's  REASON FOR ASSESSMENT:   Malnutrition Screening Tool    ASSESSMENT:   Pt with PMH of multiple myeloma, lymphoma s/p partial nephrectomy, DM, hypothyroidism presents with altered mental status   Spoke with pt at bedside. No other historians present at time of visit. Discussed pt with RN, RN reports pt has fair intake of about 20% at this time. RN reports pt was drinking Ensure earlier today. Pt reports enjoying the Ensure supplements to RD. If PO intake continues to be poor recommend liberalizing diet to regular given Hemoglobin A1C of 5.5  Pt unsure of weight status or changes, reporting "my weight changes with the equipment I get weighed on". Pt with weight loss of 5% in 4 months, not significant for time frame.  Pt reports a recent slight decrease in appetite. Pt reports consuming oatmeal and banana for breakfast, soup and sandwich for lunch, and rotisserie chicken with peas and carrots for dinner. Pt reports her sister prepares her meals.   Per chart, pt likely discharged to SNF  Labs reviewed; CBG 117-163, Chloride 116, BUN 32, Albumin 2.1, Hemoglobin 9.0, Hemoglobin A1C 5.5 (WNL) Medications reviewed; ferrous sulfate, sliding scale insulin  NUTRITION - FOCUSED PHYSICAL EXAM:  No fat depletions, mild to moderate muscle depletions and no edema.   Diet Order:  Diet Carb Modified Fluid consistency: Thin; Room service appropriate? Yes160  EDUCATION NEEDS:   No education needs have been identified at this time  Skin:  Skin Assessment: Reviewed RN  Assessment  Last BM:  11/10/17  Height:   Ht Readings from Last 1 Encounters:  11/10/17 5' 9" (1.753 m)    Weight:   Wt Readings from Last 1 Encounters:  11/10/17 137 lb 6.4 oz (62.3 kg)    Ideal Body Weight:  65.9 kg  BMI:  Body mass index is 20.29 kg/m.  Estimated Nutritional Needs:   Kcal:  1600-1800  Protein:  80-90 grams   Fluid:  >/= 1.6 L/d  Allison Ioannides, MS, RDN, LDN 11/11/2017 12:30 PM  

## 2017-11-11 NOTE — Progress Notes (Signed)
PHARMACY - PHYSICIAN COMMUNICATION CRITICAL VALUE ALERT - BLOOD CULTURE IDENTIFICATION (BCID)  Amanda Wu is an 71 y.o. female who presented to [facility] on 11/10/2017 with a chief complaint of progressive weakness  Assessment:  UTI   Name of physician (or Provider) Contacted:  Dr Wynetta Emery  Current antibiotics: Rocephin 1gm IV q24 hours  Changes to prescribed antibiotics recommended:  Increased dose to 2gm IV q24 hours  Results for orders placed or performed during the hospital encounter of 11/10/17  Blood Culture ID Panel (Reflexed) (Collected: 11/10/2017  9:44 AM)  Result Value Ref Range   Enterococcus species NOT DETECTED NOT DETECTED   Listeria monocytogenes NOT DETECTED NOT DETECTED   Staphylococcus species NOT DETECTED NOT DETECTED   Staphylococcus aureus NOT DETECTED NOT DETECTED   Streptococcus species NOT DETECTED NOT DETECTED   Streptococcus agalactiae NOT DETECTED NOT DETECTED   Streptococcus pneumoniae NOT DETECTED NOT DETECTED   Streptococcus pyogenes NOT DETECTED NOT DETECTED   Acinetobacter baumannii NOT DETECTED NOT DETECTED   Enterobacteriaceae species DETECTED (A) NOT DETECTED   Enterobacter cloacae complex NOT DETECTED NOT DETECTED   Escherichia coli DETECTED (A) NOT DETECTED   Klebsiella oxytoca NOT DETECTED NOT DETECTED   Klebsiella pneumoniae NOT DETECTED NOT DETECTED   Proteus species NOT DETECTED NOT DETECTED   Serratia marcescens NOT DETECTED NOT DETECTED   Carbapenem resistance NOT DETECTED NOT DETECTED   Haemophilus influenzae NOT DETECTED NOT DETECTED   Neisseria meningitidis NOT DETECTED NOT DETECTED   Pseudomonas aeruginosa NOT DETECTED NOT DETECTED   Candida albicans NOT DETECTED NOT DETECTED   Candida glabrata NOT DETECTED NOT DETECTED   Candida krusei NOT DETECTED NOT DETECTED   Candida parapsilosis NOT DETECTED NOT DETECTED   Candida tropicalis NOT DETECTED NOT DETECTED    Excell Seltzer Poteet 11/11/2017  8:03 AM

## 2017-11-11 NOTE — Progress Notes (Signed)
Per Dr. Kyra Searles note, pt is DNR. Order for full code. Pt states she wants DNR. Please change order.

## 2017-11-11 NOTE — NC FL2 (Signed)
[place] MEDICAID FL2 LEVEL OF CARE SCREENING TOOL     IDENTIFICATION  Patient Name: Amanda Wu Birthdate: 1946/02/27 Sex: female Admission Date (Current Location): 11/10/2017  [facility] and [place] Number:  [employer] and Address:  [facility] [address], [place]      Provider Number: [id number]  Attending Physician Name and Address:  Murlean Iba, MD  Relative Name and Phone Number:       Current Level of Care: Hospital Recommended Level of Care: [place] Prior Approval Number:    Date Approved/Denied:   PASRR Number: [id number]  Discharge Plan: SNF    Current Diagnoses: Patient Active Problem List   Diagnosis Date Noted  . Altered mental status 11/10/2017  . Mild cognitive impairment with memory loss 07/15/2017  . E-coli UTI 07/13/2017  . Syncope 07/09/2017  . Multiple myeloma ([place]) 07/09/2017  . Benign hypertension 07/09/2017  . Hypothyroidism 07/09/2017  . Diabetes mellitus without complication ([place]) 21/30/8657  . Elevated troponin 07/09/2017  . Lumbar compression fracture ([place]) 07/09/2017  . Thrombocytopenia ([place]) 07/09/2017    Orientation RESPIRATION BLADDER Height & Weight     Self, Situation, Place  Normal Incontinent Weight: 137 lb 6.4 oz (62.3 kg) Height:  '5\' 9"'  (175.3 cm)  BEHAVIORAL SYMPTOMS/MOOD NEUROLOGICAL BOWEL NUTRITION STATUS      Incontinent (Carb modified)  AMBULATORY STATUS COMMUNICATION OF NEEDS Skin   Extensive Assist Verbally Normal                       Personal Care Assistance Level of Assistance  Bathing, Feeding, Dressing Bathing Assistance: Limited assistance Feeding assistance: Independent Dressing Assistance: Limited assistance     Functional Limitations Info  Sight, Hearing, Speech Sight Info: Adequate Hearing Info: Adequate Speech Info: Adequate    SPECIAL CARE FACTORS FREQUENCY  PT (By licensed PT)     PT Frequency: 5x/week              Contractures Contractures Info: Not present    Additional Factors Info  Code Status, Allergies Code Status Info: DNR Allergies Info: Lotensin           Current Medications (11/11/2017):  This is the current hospital active medication list Current Facility-Administered Medications  Medication Dose Route Frequency Provider Last Rate Last Dose  . 0.9 %  sodium chloride infusion   Intravenous Continuous Dana Allan I, MD 75 mL/hr at 11/11/17 0502    . amLODipine (NORVASC) tablet 5 mg  5 mg Oral Daily Dana Allan I, MD   5 mg at 11/11/17 8469  . cefTRIAXone (ROCEPHIN) 2 g in dextrose 5 % 50 mL IVPB  2 g Intravenous Q24H Murlean Iba, MD   Stopped at 11/11/17 0955  . enoxaparin (LOVENOX) injection 30 mg  30 mg Subcutaneous Q24H Dana Allan I, MD   30 mg at 11/10/17 1451  . feeding supplement (ENSURE ENLIVE) (ENSURE ENLIVE) liquid 237 mL  237 mL Oral BID BM Dana Allan I, MD   237 mL at 11/11/17 1059  . ferrous sulfate tablet 325 mg  325 mg Oral Q breakfast Dana Allan I, MD   325 mg at 11/11/17 0820  . insulin aspart (novoLOG) injection 0-5 Units  0-5 Units Subcutaneous QHS Dana Allan I, MD      . insulin aspart (novoLOG) injection 0-9 Units  0-9 Units Subcutaneous TID WC Dana Allan I, MD   2 Units at 11/11/17 1212  . levothyroxine (  SYNTHROID, LEVOTHROID) tablet 25 mcg  25 mcg Oral QAC breakfast Bonnell Public, MD   25 mcg at 11/11/17 0820     Discharge Medications: Please see discharge summary for a list of discharge medications.  Relevant Imaging Results:  Relevant Lab Results:   Additional Information SSN [id number] Clydene Pugh, LCSW

## 2017-11-11 NOTE — Progress Notes (Signed)
PROGRESS NOTE    Amanda Wu  RCV:[id number]  DOB: 03/11/1946  DOA: 11/10/2017 PCP: Virgie Dad, MD   Brief Admission Hx: 71 year old African American Female with history of Multiple Myeloma, CKD 3, Hypothyroidism, DM, HTN, lymphoma and status partial Nephrectomy who presented with dehydration and UTI.  She also tested positive for E coli bacteremia.    MDM/Assessment & Plan:   1. E coli bacteremia - urinary source: started ceftriaxone 2 gm IV every 24 hours and will treat with antibiotics for 10-14 days total.  Sensitivities pending.   2. UTI - recurrent E coli infection - treating with ceftriaxone as above.  3. Dehydration - treating with gentle IVFs.   4. Generalized weakness - Will ask for PT evaluation.   5. AKI on CKD stage 3 - treating with gentle IVFs and repeat BMP in AM.   6. Anemia, unspecified - stable, following CBC.  7. Multiple myeloma - follow up with oncology after discharge.  8. Fever - has defervesced.  9. DNR - confirmed with patient.   DVT prophylaxis: lovenox Code Status: DNR Family Communication: sister Disposition Plan: hopeful to discharge in 1-2 days  Subjective: Pt says that she is feeling a lot better today.  She would like to work with PT today.    Objective: Vitals:   11/10/17 1500 11/10/17 1606 11/10/17 2121 11/11/17 0434  BP: 123/70 (!) 144/71 122/77 115/72  Pulse:  94 92 87  Resp: (!) 33 (!) _0 Temp:  (!) 97.4 F (36.3 C) 99.6 F (37.6 C) 100.3 F (37.9 C)  TempSrc:  Oral Oral Oral  SpO2:  100% 95% (!) 87%  Weight:  62.3 kg (137 lb 6.4 oz)    Height:  _1  (1.753 m)      Intake/Output Summary (Last 24 hours) at 11/11/2017 0752 Last data filed at 11/11/2017 0315 Gross per 24 hour  Intake 4087.5 ml  Output 1400 ml  Net 2687.5 ml   Filed Weights   11/10/17 0914 11/10/17 1606  Weight: 59 kg (130 lb) 62.3 kg (137 lb 6.4 oz)     REVIEW OF SYSTEMS  As per history otherwise all reviewed and reported  negative  Exam:  General exam: thin female, awake, alert, NAD. Cooperative.  Respiratory system: Clear. No increased work of breathing. Cardiovascular system: S1 & S2 heard, RRR.  Gastrointestinal system: Abdomen is nondistended, soft and nontender. Normal bowel sounds heard. Central nervous system: Alert and oriented. No focal neurological deficits. Extremities: no cyanosis or clubbing.  Data Reviewed: Basic Metabolic Panel: Recent Labs  Lab 11/10/17 0928 11/10/17 1608 11/11/17 0403  NA 135  --  144  K 4.6  --  4.0  CL 106  --  116*  CO2 22  --  21*  GLUCOSE 148*  --  119*  BUN 40*  --  32*  CREATININE 1.97*  --  1.46*  CALCIUM 8.8*  --  8.3*  MG  --  2.3  --   PHOS  --  2.8  --    Liver Function Tests: Recent Labs  Lab 11/10/17 0937 11/11/17 0403  AST 59* 46*  ALT 62* 49  ALKPHOS 118 119  BILITOT 1.0 0.8  PROT 6.7 6.9  ALBUMIN 2.2* 2.1*   No results for input(s): LIPASE, AMYLASE in the last 168 hours. No results for input(s): AMMONIA in the last 168 hours. CBC: Recent Labs  Lab 11/10/17 0928 11/10/17 0937 11/11/17 0403  WBC 8.6  --  7.3  NEUTROABS  --  7.5  --   HGB 9.0*  --  9.0*  HCT 27.8*  --  28.2*  MCV 91.7  --  92.5  PLT 97*  --  118*   Cardiac Enzymes: No results for input(s): CKTOTAL, CKMB, CKMBINDEX, TROPONINI in the last 168 hours. CBG (last 3)  Recent Labs    11/10/17 1617 11/10/17 2125 11/11/17 0733  GLUCAP 124* 117* 119*   Recent Results (from the past 240 hour(s))  Blood Culture (routine x 2)     Status: None (Preliminary result)   Collection Time: 11/10/17  9:44 AM  Result Value Ref Range Status   Specimen Description BLOOD RIGHT ANTECUBITAL  Final   Special Requests   Final    BOTTLES DRAWN AEROBIC AND ANAEROBIC Blood Culture adequate volume   Culture  Setup Time   Final    GRAM NEGATIVE RODS Gram Stain Report Called to,Read Back By and Verified With: AMBURN,A ON 11/10/17 AT 2310 BY LOY,C PERFORMED AT [facility] IN BOTH AEROBIC  AND ANAEROBIC BOTTLES CRITICAL RESULT CALLED TO, READ BACK BY AND VERIFIED WITH: Cottie Banda [id number] MLM Performed at [facility], [place] [address] [place], [place] [address]    Culture GRAM NEGATIVE RODS  Final   Report Status PENDING  Incomplete  Blood Culture (routine x 2)     Status: None (Preliminary result)   Collection Time: 11/10/17  9:44 AM  Result Value Ref Range Status   Specimen Description BLOOD BLOOD LEFT HAND  Final   Special Requests   Final    BOTTLES DRAWN AEROBIC AND ANAEROBIC Blood Culture adequate volume   Culture  Setup Time   Final    GRAM NEGATIVE RODS IN BOTH AEROBIC AND ANAEROBIC BOTTLES Gram Stain Report Called to,Read Back By and Verified With: AMBURN,A_0  BY MATTHEWS, B 11.27.18 [facility] IDENTIFICATION TO FOLLOW Performed at [facility], [place] [address], [place],  [address]    Culture NO GROWTH < 24 HOURS  Final   Report Status PENDING  Incomplete  Blood Culture ID Panel (Reflexed)     Status: Abnormal   Collection Time: 11/10/17  9:44 AM  Result Value Ref Range Status   Enterococcus species NOT DETECTED NOT DETECTED Final   Listeria monocytogenes NOT DETECTED NOT DETECTED Final   Staphylococcus species NOT DETECTED NOT DETECTED Final   Staphylococcus aureus NOT DETECTED NOT DETECTED Final   Streptococcus species NOT DETECTED NOT DETECTED Final   Streptococcus agalactiae NOT DETECTED NOT DETECTED Final   Streptococcus pneumoniae NOT DETECTED NOT DETECTED Final   Streptococcus pyogenes NOT DETECTED NOT DETECTED Final   Acinetobacter baumannii NOT DETECTED NOT DETECTED Final   Enterobacteriaceae species DETECTED (A) NOT DETECTED Final    Comment: Enterobacteriaceae represent a large family of gram-negative bacteria, not a single organism. CRITICAL RESULT CALLED TO, READ BACK BY AND VERIFIED WITH: PHARMD L SEAY [id number] MLM    Enterobacter cloacae complex NOT DETECTED NOT DETECTED Final   Escherichia coli DETECTED  (A) NOT DETECTED Final    Comment: CRITICAL RESULT CALLED TO, READ BACK BY AND VERIFIED WITH: PHARMD L SEAY 11/11/17 0739 MLM    Klebsiella oxytoca NOT DETECTED NOT DETECTED Final   Klebsiella pneumoniae NOT DETECTED NOT DETECTED Final   Proteus species NOT DETECTED NOT DETECTED Final   Serratia marcescens NOT DETECTED NOT DETECTED Final   Carbapenem resistance NOT DETECTED NOT DETECTED Final   Haemophilus influenzae NOT DETECTED NOT DETECTED  Final   Neisseria meningitidis NOT DETECTED NOT DETECTED Final   Pseudomonas aeruginosa NOT DETECTED NOT DETECTED Final   Candida albicans NOT DETECTED NOT DETECTED Final   Candida glabrata NOT DETECTED NOT DETECTED Final   Candida krusei NOT DETECTED NOT DETECTED Final   Candida parapsilosis NOT DETECTED NOT DETECTED Final   Candida tropicalis NOT DETECTED NOT DETECTED Final    Comment: Performed at [facility], [place] [address], [place], [place] [address]     Studies: Dg Chest Port 1 View  Result Date: 11/10/2017 CLINICAL DATA:  Weakness and fever.  Multiple myeloma . EXAM: PORTABLE CHEST 1 VIEW COMPARISON:  07/11/2017 .  01/19/2011. FINDINGS: Mediastinum and hilar structures are stable. Right upper mediastinal fullness consistent with prominent great vessels again noted without interim change. Heart size stable. Again noted are low lung volumes. Persistent mild right base atelectasis/infiltrate. Air complete resolution of left base atelectasis and effusion. Small lucency noted over the soft tissues of proximal right upper extremity of undetermined etiology. Follow-up right humerus series suggested for further evaluation. IMPRESSION: 1. Again noted are low lung volumes. Persistent mild right base atelectasis/infiltrate. Interim near complete resolution of mild left base atelectasis and effusion . 2. Small lucency noted in the soft tissues of the right upper extremity of undetermined etiology. Follow-up right humerus series suggested for further  evaluation . Electronically Signed   By: Marcello Moores  Register   On: 11/10/2017 10:14   Scheduled Meds: . amLODipine  5 mg Oral Daily  . enoxaparin (LOVENOX) injection  30 mg Subcutaneous Q24H  . feeding supplement (ENSURE ENLIVE)  237 mL Oral BID BM  . ferrous sulfate  325 mg Oral Q breakfast  . insulin aspart  0-5 Units Subcutaneous QHS  . insulin aspart  0-9 Units Subcutaneous TID WC  . levothyroxine  25 mcg Oral QAC breakfast   Continuous Infusions: . sodium chloride 75 mL/hr at 11/11/17 0502  . cefTRIAXone (ROCEPHIN)  IV      Active Problems:   Altered mental status   Time spent:   Irwin Brakeman, MD, FAAFP [facility] Pager [phone]  If 7PM-7AM, please contact night-coverage [email or web address] Password [email or web address] 11/11/2017, 7:52 AM    LOS: 1 day

## 2017-11-11 NOTE — Progress Notes (Signed)
Pt given Tylenol for a temperature of 100.1. Will continue to monitor

## 2017-11-11 NOTE — Progress Notes (Addendum)
Dr. Baltazar Najjar paged and made aware of pts diarrhea episodes, RN asked for an order to check for Cdiff and see if pt needs to be placed on enteric precautions. Will wait for call/back orders

## 2017-11-11 NOTE — Progress Notes (Signed)
After speaking with Dr. Baltazar Najjar, she stated if pt doesn't have a increased WBC or abdominal pain, she is going to wait on the C-diff. From documentation, this is the second Bm today, will continue to monitor pt.

## 2017-11-11 NOTE — Clinical Social Work Note (Signed)
Clinical Social Work Assessment  Patient Details  Name: Amanda Wu MRN: [id number] Date of Birth: [birth year]  Date of referral:  11/11/17               Reason for consult:  Facility Placement                Permission sought to share information with:    Permission granted to share information::     Name::        Agency::     Relationship::     Contact Information:  sisters, Morton Stall and Romie Minus were at bedside  Housing/Transportation Living arrangements for the past 2 months:  Single Family Home Source of Information:  Patient, Other (Comment Required)(sisters) Patient Interpreter Needed:  None Criminal Activity/Legal Involvement Pertinent to Current Situation/Hospitalization:  No - Comment as needed Significant Relationships:  Siblings Lives with:  Siblings Do you feel safe going back to the place where you live?  Yes Need for family participation in patient care:  Yes (Comment)  Care giving concerns:  None identified at baseline.   Social Worker assessment / plan:  At baseline, patient ambulates independently and is independent in her ADLs. Prior to admission patient began to experience episodes of incontinence and need for increased assistance from her sisters in ADL completion. Patient is agreeable to short term SNF.   Employment status:  Retired Nurse, adult PT Recommendations:  [place] / Referral to community resources:  [place]  Patient/Family's Response to care:  Patient is agreeable to short term SNF.   Patient/Family's Understanding of and Emotional Response to Diagnosis, Current Treatment, and Prognosis:  Patient understands her diagnosis, treatment and prognosis.   Emotional Assessment Appearance:  Appears stated age Attitude/Demeanor/Rapport:    Affect (typically observed):  Calm, Accepting Orientation:  Oriented to Self, Oriented to Place, Oriented to Situation Alcohol /  Substance use:  Not Applicable Psych involvement (Current and /or in the community):  No (Comment)  Discharge Needs  Concerns to be addressed:  Discharge Planning Concerns Readmission within the last 30 days:  No Current discharge risk:  None Barriers to Discharge:  Insurance Authorization   Ihor Gully, LCSW 11/11/2017, 2:45 PM

## 2017-11-12 DIAGNOSIS — N3000 Acute cystitis without hematuria: Secondary | ICD-10-CM

## 2017-11-12 LAB — URINE CULTURE: Culture: NO GROWTH

## 2017-11-12 LAB — GLUCOSE, CAPILLARY
Glucose-Capillary: 129 mg/dL — ABNORMAL HIGH (ref 65–99)
Glucose-Capillary: 116 mg/dL — ABNORMAL HIGH (ref 65–99)
Glucose-Capillary: 157 mg/dL — ABNORMAL HIGH (ref 65–99)

## 2017-11-12 MED ORDER — CIPROFLOXACIN HCL 500 MG PO TABS: 500.0000 mg | ORAL_TABLET | Freq: Two times a day (BID) | ORAL | 0 refills | Status: AC

## 2017-11-12 NOTE — Discharge Summary (Signed)
Physician Discharge Summary  Amanda Wu KGY:[id number] DOB: 1946-04-02 DOA: 11/10/2017  PCP: Virgie Dad, MD  Admit date: 11/10/2017 Discharge date: 11/12/2017  Time spent: 45 minutes  Recommendations for Outpatient Follow-up:  -Will be discharged home today. -Advised to follow-up with PCP in 2 weeks. -Was initially going to go to SNF, however due to cost of Revlimid this was not able to occur.  Discharge Diagnoses:  Active Problems:   Altered mental status   Discharge Condition: Stable and improved  Filed Weights   11/10/17 0914 11/10/17 1606  Weight: 59 kg (130 lb) 62.3 kg (137 lb 6.4 oz)    History of present illness:  As per Dr. Marthenia Rolling on 11/27: 71 year old African American Female with history of Multiple Myeloma, CKD 3, Hypothyroidism, DM, HTN, lymphoma and status partial Nephrectomy. Patient was seen alongside patient's older sister that provided most of the history. Patient is said to have become progressively weak, to the extent that it has become difficult for the patient to co operate with dress changes. Associated poor po intake, fever and confusion. Patient is volume deplete, and UA is suggestive of likely UTI. Albumin is 2.2, with abnormal ALT and AST. No headache, no neck pain, no chest pain, no SOB, no abdominal pain and no reported urinary symptoms. Serum creatinine is up from 1.27 to 1.97. Urine protein is 30.    Hospital Course:   1. E coli bacteremia - urinary source: started on ceftriaxone initially and has done remarkably well.  Sensitivities are still pending, however she feels well and is anxious to be discharged.  We will send home on Cipro 500 mg twice daily for a total of 14 days, will shoot for prolonged course given bacteremia.   2. UTI - recurrent E coli infection -see above. 3. Dehydration -resolved with IV fluids  4. Generalized weakness -initially recommended for SNF for short-term rehab by PT, however due to cost of Revlimid this will  not be a possibility.  5. AKI on CKD stage 3 - treating with gentle IVFs .  Creatinine is back to baseline on discharge. 6. Anemia, unspecified - stable, following CBC.  7. Multiple myeloma - follow up with oncology after discharge.  Continue Revlimid 8. DNR - confirmed with patient.      Procedures:  None  Consultations:  None  Discharge Instructions  Discharge Instructions    Diet - low sodium heart healthy   Complete by:  As directed    Increase activity slowly   Complete by:  As directed      Allergies as of 11/12/2017      Reactions   Lotensin [benazepril Hcl]    Facial swelling (Angioedema) Note: ARBs should also be contraindicated, but she is on Losartan!      Medication List    TAKE these medications   acetaminophen 500 MG tablet Commonly known as:  TYLENOL Take 1,000 mg by mouth every 6 (six) hours as needed for mild pain.   amLODipine 5 MG tablet Commonly known as:  NORVASC Take 5 mg by mouth daily.   CENTRUM ADULTS PO Take 1 tablet by mouth daily.   ciprofloxacin 500 MG tablet Commonly known as:  CIPRO Take 1 tablet (500 mg total) by mouth 2 (two) times daily for 14 days.   ferrous sulfate 325 (65 FE) MG tablet Take 325 mg by mouth daily with breakfast.   levothyroxine 25 MCG tablet Commonly known as:  SYNTHROID, LEVOTHROID Take 25 mcg by  mouth daily before breakfast.   losartan 100 MG tablet Commonly known as:  COZAAR Take 100 mg by mouth daily.   metFORMIN 500 MG tablet Commonly known as:  GLUCOPHAGE Take 250 mg by mouth every other day.   potassium chloride 10 MEQ tablet Commonly known as:  K-DUR Take 30 mEq by mouth daily.   REVLIMID 15 MG capsule Generic drug:  lenalidomide Take 15 mg by mouth daily. For two weeks then off for three weeks. Then follow up doctor      Allergies  Allergen Reactions  . Lotensin [Benazepril Hcl]     Facial swelling (Angioedema) Note: ARBs should also be contraindicated, but she is on Losartan!       The results of significant diagnostics from this hospitalization (including imaging, microbiology, ancillary and laboratory) are listed below for reference.    Significant Diagnostic Studies: Dg Chest Port 1 View  Result Date: 11/10/2017 CLINICAL DATA:  Weakness and fever.  Multiple myeloma . EXAM: PORTABLE CHEST 1 VIEW COMPARISON:  07/11/2017 .  01/19/2011. FINDINGS: Mediastinum and hilar structures are stable. Right upper mediastinal fullness consistent with prominent great vessels again noted without interim change. Heart size stable. Again noted are low lung volumes. Persistent mild right base atelectasis/infiltrate. Air complete resolution of left base atelectasis and effusion. Small lucency noted over the soft tissues of proximal right upper extremity of undetermined etiology. Follow-up right humerus series suggested for further evaluation. IMPRESSION: 1. Again noted are low lung volumes. Persistent mild right base atelectasis/infiltrate. Interim near complete resolution of mild left base atelectasis and effusion . 2. Small lucency noted in the soft tissues of the right upper extremity of undetermined etiology. Follow-up right humerus series suggested for further evaluation . Electronically Signed   By: Marcello Moores  Register   On: 11/10/2017 10:14    Microbiology: Recent Results (from the past 240 hour(s))  Blood Culture (routine x 2)     Status: Abnormal (Preliminary result)   Collection Time: 11/10/17  9:44 AM  Result Value Ref Range Status   Specimen Description BLOOD RIGHT ANTECUBITAL  Final   Special Requests   Final    BOTTLES DRAWN AEROBIC AND ANAEROBIC Blood Culture adequate volume   Culture  Setup Time   Final    GRAM NEGATIVE RODS Gram Stain Report Called to,Read Back By and Verified With: AMBURN,A ON 11/10/17 AT 2310 BY LOY,C PERFORMED AT [facility] IN BOTH AEROBIC AND ANAEROBIC BOTTLES CRITICAL RESULT CALLED TO, READ BACK BY AND VERIFIED WITH: PHARMD L SEAY [id number] MLM     Culture (A)  Final    ESCHERICHIA COLI SUSCEPTIBILITIES TO FOLLOW Performed at [facility], [place] [address] [place], [place] [address]    Report Status PENDING  Incomplete  Blood Culture (routine x 2)     Status: None (Preliminary result)   Collection Time: 11/10/17  9:44 AM  Result Value Ref Range Status   Specimen Description BLOOD BLOOD LEFT HAND  Final   Special Requests   Final    BOTTLES DRAWN AEROBIC AND ANAEROBIC Blood Culture adequate volume   Culture  Setup Time   Final    GRAM NEGATIVE RODS IN BOTH AEROBIC AND ANAEROBIC BOTTLES Gram Stain Report Called to,Read Back By and Verified With: AMBURN,A'@2342'  BY MATTHEWS, B 11.27.18  HOSP Performed at [facility], [place] [address] [place], [place] [address]    Culture GRAM NEGATIVE RODS  Final   Report Status PENDING  Incomplete  Blood Culture ID Panel (  Reflexed)     Status: Abnormal   Collection Time: 11/10/17  9:44 AM  Result Value Ref Range Status   Enterococcus species NOT DETECTED NOT DETECTED Final   Listeria monocytogenes NOT DETECTED NOT DETECTED Final   Staphylococcus species NOT DETECTED NOT DETECTED Final   Staphylococcus aureus NOT DETECTED NOT DETECTED Final   Streptococcus species NOT DETECTED NOT DETECTED Final   Streptococcus agalactiae NOT DETECTED NOT DETECTED Final   Streptococcus pneumoniae NOT DETECTED NOT DETECTED Final   Streptococcus pyogenes NOT DETECTED NOT DETECTED Final   Acinetobacter baumannii NOT DETECTED NOT DETECTED Final   Enterobacteriaceae species DETECTED (A) NOT DETECTED Final    Comment: Enterobacteriaceae represent a large family of gram-negative bacteria, not a single organism. CRITICAL RESULT CALLED TO, READ BACK BY AND VERIFIED WITH: PHARMD L SEAY [id number] MLM    Enterobacter cloacae complex NOT DETECTED NOT DETECTED Final   Escherichia coli DETECTED (A) NOT DETECTED Final    Comment: CRITICAL RESULT CALLED TO, READ BACK BY AND VERIFIED WITH: PHARMD L  SEAY 11/11/17 0739 MLM    Klebsiella oxytoca NOT DETECTED NOT DETECTED Final   Klebsiella pneumoniae NOT DETECTED NOT DETECTED Final   Proteus species NOT DETECTED NOT DETECTED Final   Serratia marcescens NOT DETECTED NOT DETECTED Final   Carbapenem resistance NOT DETECTED NOT DETECTED Final   Haemophilus influenzae NOT DETECTED NOT DETECTED Final   Neisseria meningitidis NOT DETECTED NOT DETECTED Final   Pseudomonas aeruginosa NOT DETECTED NOT DETECTED Final   Candida albicans NOT DETECTED NOT DETECTED Final   Candida glabrata NOT DETECTED NOT DETECTED Final   Candida krusei NOT DETECTED NOT DETECTED Final   Candida parapsilosis NOT DETECTED NOT DETECTED Final   Candida tropicalis NOT DETECTED NOT DETECTED Final    Comment: Performed at [facility], [place] [address], [place], [place] [address]  Urine Culture     Status: Abnormal (Preliminary result)   Collection Time: 11/10/17  9:53 AM  Result Value Ref Range Status   Specimen Description URINE, CATHETERIZED  Final   Special Requests NONE  Final   Culture (A)  Final    >=100,000 COLONIES/mL ESCHERICHIA COLI SUSCEPTIBILITIES TO FOLLOW Performed at [facility], [place] [address] [place], [place] [address]    Report Status PENDING  Incomplete  Urine culture     Status: None   Collection Time: 11/10/17  3:55 PM  Result Value Ref Range Status   Specimen Description URINE, RANDOM  Final   Special Requests NONE  Final   Culture   Final    NO GROWTH Performed at [facility], [place] [address] [place], [place] [address]    Report Status 11/12/2017 FINAL  Final     Labs: Basic Metabolic Panel: Recent Labs  Lab 11/10/17 0928 11/10/17 1608 11/11/17 0403  NA 135  --  144  K 4.6  --  4.0  CL 106  --  116*  CO2 22  --  21*  GLUCOSE 148*  --  119*  BUN 40*  --  32*  CREATININE 1.97*  --  1.46*  CALCIUM 8.8*  --  8.3*  MG  --  2.3  --   PHOS  --  2.8  --    Liver Function Tests: Recent Labs  Lab  11/10/17 0937 11/11/17 0403  AST 59* 46*  ALT 62* 49  ALKPHOS 118 119  BILITOT 1.0 0.8  PROT 6.7 6.9  ALBUMIN 2.2* 2.1*   No  results for input(s): LIPASE, AMYLASE in the last 168 hours. No results for input(s): AMMONIA in the last 168 hours. CBC: Recent Labs  Lab 11/10/17 0928 11/10/17 0937 11/11/17 0403  WBC 8.6  --  7.3  NEUTROABS  --  7.5  --   HGB 9.0*  --  9.0*  HCT 27.8*  --  28.2*  MCV 91.7  --  92.5  PLT 97*  --  118*   Cardiac Enzymes: No results for input(s): CKTOTAL, CKMB, CKMBINDEX, TROPONINI in the last 168 hours. BNP: BNP (last 3 results) No results for input(s): BNP in the last 8760 hours.  ProBNP (last 3 results) No results for input(s): PROBNP in the last 8760 hours.  CBG: Recent Labs  Lab 11/11/17 1629 11/11/17 2132 11/12/17 0722 11/12/17 1107 11/12/17 1619  GLUCAP 150* 152* 129* 116* 157*       Signed:  Lelon Frohlich  [facility] Pager: [phone] 11/12/2017, 5:23 PM

## 2017-11-12 NOTE — Care Management (Signed)
Cm received notice from CSW that pt's family wishes for her to go home. CM attempted to contact pt's sister Deneise Lever with no success. Pt's sister Benjamine Mola at the bedside, CM spoke with sister over the phone. CM discussed options of HH providers and Benjamine Mola does not know other sisters (Annie's) preference of [place] agency, unaware of previous providers and has no preference. Benjamine Mola believes it may have been [facility]. Agreement made to refer to [facility] (sister aware they have 48 hrs to make first visit) and if Deneise Lever would prefer another agency they may contact the CM dept at [facility] and referral will be sent elsewhere. Elizabeth given contact info form [employer]. Vaughan Basta, [facility] rep, aware of referral and will pull pt info from chart.

## 2017-11-12 NOTE — Clinical Social Work Note (Signed)
Patient's family has made the decision for patient to go home.    LCSW signing off.      Ryu Cerreta, Clydene Pugh, LCSW

## 2017-11-12 NOTE — Clinical Social Work Note (Signed)
Patient's sister Deneise Lever state that patient is not currently on revlimid. She stated that patient has an appointment with her oncologist on 12/6 and at that time she will be placed back on the medication for two weeks then off for four weeks. She stated that the medication is paid for by a foundation.   LCSW contacted [facility] to discuss patient's revlimid. LCSW left a message with Precious inquiring about the payor source for the medication. Precious stated that she would message Dr. Lorette Ang nurse and have them contact LCSW back about payor source.     Khamari Yousuf, Clydene Pugh, LCSW

## 2017-11-12 NOTE — Progress Notes (Signed)
Discharge instructions read to patients sister and patient.  Patient and sister both verbalized understanding of instructions.  Pt discharged to home with sister

## 2017-11-12 NOTE — Progress Notes (Signed)
Physical Therapy Treatment Patient Details Name: Amanda Wu MRN: [id number] DOB: 05/07/1946 Today's Date: 11/12/2017    History of Present Illness 71 year old African American Female with history of Multiple Myeloma, CKD 3, Hypothyroidism, DM, HTN, lymphoma and status partial Nephrectomy. Patient was seen alongside patient's older sister that provided most of the history. Patient is said to have become progressively weak, to the extent that it has become difficult for the patient to co operate with dress changes. Associated poor po intake, fever and confusion. Patient is volume deplete, and UA is suggestive of likely UTI. Albumin is 2.2, with abnormal ALT and AST. No headache, no neck pain, no chest pain, no SOB, no abdominal pain and no reported urinary symptoms. Serum creatinine is up from 1.27 to 1.97. Urine protein is 30.    PT Comments    Patient demonstrates increased tolerance for out of bed/gait training (see below) and tolerated sitting up in chair after therapy.  Patient will benefit from continued physical therapy in hospital and recommended venue below to increase strength, balance, endurance for safe ADLs and gait.   Follow Up Recommendations  SNF;Supervision/Assistance - 24 hour     Equipment Recommendations  None recommended by PT    Recommendations for Other Services       Precautions / Restrictions Precautions Precautions: Fall Restrictions Weight Bearing Restrictions: No    Mobility  Bed Mobility Overal bed mobility: Needs Assistance Bed Mobility: Supine to Sit     Supine to sit: Min assist     General bed mobility comments: demonstrates labored movement  Transfers Overall transfer level: Needs assistance Equipment used: Rolling walker (2 wheeled) Transfers: Sit to/from Stand;Stand Pivot Transfers Sit to Stand: Min assist Stand pivot transfers: Min assist          Ambulation/Gait Ambulation/Gait assistance: Min assist Ambulation Distance  (Feet): 55 Feet Assistive device: Rolling walker (2 wheeled) Gait Pattern/deviations: Decreased step length - right;Decreased step length - left;Decreased stride length   Gait velocity interpretation: Below normal speed for age/gender General Gait Details: demonstrates slightly labored slow cadence without loss of balance, limited secondary to fatigue   Stairs            Wheelchair Mobility    Modified Rankin (Stroke Patients Only)       Balance Overall balance assessment: Needs assistance Sitting-balance support: No upper extremity supported;Feet supported Sitting balance-Leahy Scale: Good     Standing balance support: Bilateral upper extremity supported;During functional activity Standing balance-Leahy Scale: Fair                              Cognition Arousal/Alertness: Awake/alert Behavior During Therapy: WFL for tasks assessed/performed Overall Cognitive Status: Within Functional Limits for tasks assessed                                        Exercises General Exercises - Lower Extremity Long Arc Quad: Seated;AROM;Strengthening;Both;10 reps Hip Flexion/Marching: Seated;AROM;Strengthening;Both;10 reps Toe Raises: Seated;AROM;Strengthening;Both;10 reps Heel Raises: Seated;AROM;Strengthening;Both;10 reps    General Comments        Pertinent Vitals/Pain Pain Assessment: No/denies pain    Home Living                      Prior Function            PT Goals (current goals   can now be found in the care plan section) Acute Rehab PT Goals Patient Stated Goal: to get stronger  PT Goal Formulation: With patient Time For Goal Achievement: 11/25/17 Potential to Achieve Goals: Good Progress towards PT goals: Progressing toward goals    Frequency    Min 3X/week      PT Plan Current plan remains appropriate    Co-evaluation              AM-PAC PT "6 Clicks" Daily Activity  Outcome Measure  Difficulty  turning over in bed (including adjusting bedclothes, sheets and blankets)?: A Little Difficulty moving from lying on back to sitting on the side of the bed? : A Little Difficulty sitting down on and standing up from a chair with arms (e.g., wheelchair, bedside commode, etc,.)?: A Little Help needed moving to and from a bed to chair (including a wheelchair)?: A Little Help needed walking in hospital room?: A Little Help needed climbing 3-5 steps with a railing? : A Little 6 Click Score: 18    End of Session Equipment Utilized During Treatment: Gait belt Activity Tolerance: Patient tolerated treatment well;Patient limited by fatigue Patient left: in chair;with call bell/phone within reach;with chair alarm set(left with telemonitor on) Nurse Communication: Mobility status PT Visit Diagnosis: Unsteadiness on feet (R26.81);Other abnormalities of gait and mobility (R26.89);Muscle weakness (generalized) (M62.81);History of falling (Z91.81);Difficulty in walking, not elsewhere classified (R26.2)     Time: 2130-8657 PT Time Calculation (min) (ACUTE ONLY): 32 min  Charges:  $Gait Training: 8-22 mins $Therapeutic Exercise: 8-22 mins                    G Codes:       11:32 AM, 2017/11/14 Lonell Grandchild, MPT Physical Therapist with [facility] [phone] office [phone] mobile phone

## 2017-11-12 NOTE — Care Management Important Message (Signed)
Important Message  Patient Details  Name: Amanda Wu MRN: [id number] Date of Birth: December 26, 1945   Medicare Important Message Given:  Yes    Sherald Barge, RN 11/12/2017, 12:16 PM

## 2017-11-13 ENCOUNTER — Telehealth: Payer: Self-pay

## 2017-11-13 LAB — URINE CULTURE

## 2017-11-13 LAB — CULTURE, BLOOD (ROUTINE X 2)
SPECIAL REQUESTS: ADEQUATE
Special Requests: ADEQUATE

## 2017-11-13 NOTE — Telephone Encounter (Signed)
Transition Care Management Follow-up Telephone Call  Spoke with patients sister, Amanda Wu   After speaking with patients sister, she had discussed using patients old PCP, Dr.Shah.   Date discharged? 11/12/2017   How have you been since you were released from the hospital? better   Do you understand why you were in the hospital? yes   Do you understand the discharge instructions? yes   Where were you discharged to? home   Items Reviewed:  Medications reviewed: yes  Allergies reviewed: yes  Dietary changes reviewed:yes  Referrals reviewed: yes   Functional Questionnaire:   Activities of Daily Living (ADLs):   She states they are independent in the following: ambulation States they require assistance with the following: bathing and hygiene, feeding, continence, grooming, toileting and dressing   Any transportation issues/concerns?: no   Any patient concerns? no   Confirmed importance and date/time of follow-up visits scheduled no, patient seeing new provider   Confirmed with patient if condition begins to worsen call PCP or go to the ER.  Patient was given the office number and encouraged to call back with question or concerns.  : yes

## 2018-05-20 DIAGNOSIS — Z5112 Encounter for antineoplastic immunotherapy: Secondary | ICD-10-CM | POA: Diagnosis not present

## 2018-05-20 DIAGNOSIS — C9002 Multiple myeloma in relapse: Secondary | ICD-10-CM | POA: Diagnosis not present

## 2018-05-24 ENCOUNTER — Encounter (HOSPITAL_COMMUNITY): Payer: Self-pay | Admitting: Hematology

## 2018-05-24 ENCOUNTER — Other Ambulatory Visit: Payer: Self-pay

## 2018-05-24 ENCOUNTER — Inpatient Hospital Stay (HOSPITAL_COMMUNITY): Payer: Medicare PPO | Attending: Hematology | Admitting: Hematology

## 2018-05-24 DIAGNOSIS — C9000 Multiple myeloma not having achieved remission: Secondary | ICD-10-CM | POA: Diagnosis not present

## 2018-05-24 DIAGNOSIS — Z5112 Encounter for antineoplastic immunotherapy: Secondary | ICD-10-CM | POA: Insufficient documentation

## 2018-05-24 NOTE — Assessment & Plan Note (Signed)
1.  IgG lambda multiple myeloma: - Revlimid 76m and dexamethasone 3 weeks on 1 week off started in September 2009, changed to 2 weeks on 2 weeks off in November 2010, changed to 2 weeks on 3 weeks of in May 2012, Revlimid 15 mg 14/35 days and weekly Dex in January 2015, Revlimid 15 mg change to 14/42 days and weekly Dex in June 2017, dexamethasone stopped in November 2017 - Velcade and dexamethasone weekly, 3 weeks on/1 week off started recently -Dr. LMaylon Peppershas called me to treat this patient locally so that she can follow-up with him periodically.  We will arrange her Velcade and dexamethasone next cycle starting next week.  She is tolerating it very well.  She does not report any neuropathy symptoms.  She will continue acyclovir twice daily.  She was told to start taking aspirin 81 mg daily. -She received Zometa monthly starting September 2009, switch to every 3 months in 2012 and lately off of it.  2.  Remote history of CNS marked like lymphoma, treated with chemotherapy and radiation, last brain MRI was in 2013, CT of the brain in 2018 with no evidence of recurrence.  She does have some dementia from it.  She will continue Aricept 10 mg daily.

## 2018-05-24 NOTE — Progress Notes (Signed)
[facility] NOTE  Patient Care Team: Virgie Dad, MD as PCP - General (Internal Medicine)  CHIEF COMPLAINTS/PURPOSE OF CONSULTATION:  Multiple myeloma.  HISTORY OF PRESENTING ILLNESS:  Amanda Wu 71 y.o. female is seen in consultation today for receiving treatments close to home for her multiple myeloma.  She was followed for MGUS which transformed into myeloma in 2009.  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  She started receiving therapy for her myeloma with Revlimid in September 2009 and was placed on maintenance Revlimid until recently.  She was later switched to Velcade and dexamethasone 3 weeks on 1 week off.  She receives 10 mg of dexamethasone on the days of Velcade.  She denies any infections or hospitalizations recently.  She denies any tingling or numbness next remedies.  She is continuing acyclovir twice daily.  She has some memory loss from radiation therapy for her CNS tumor.  She lives at home with her 2 other sisters.  One sister had breast cancer.  Complains of some vision changes which have been chronic.  She has not seen an optometrist recently.  Denies any new onset pains.  Denies any fevers, night sweats or weight loss.  MEDICAL HISTORY:  Past Medical History:  Diagnosis Date  . Benign hypertension   . Cataract   . Central nervous system lymphoma ([place])   . Diabetes mellitus without complication ([place])   . H/O partial nephrectomy   . Hypokalemia   . Hypothyroidism   . Impaired cognition   . Multiple myeloma (HCC)    Dr Maylon Peppers, [facility]  . Thyroid disease     SURGICAL HISTORY: Past Surgical History:  Procedure Laterality Date  . ABDOMINAL HYSTERECTOMY    . CRANIOTOMY     for lymphoma  . YAG LASER APPLICATION Right 06/15/6377   Procedure: YAG LASER APPLICATION;  Surgeon: [name], MD;  Location: [facility];  Service: Ophthalmology;   Laterality: Right;    SOCIAL HISTORY: Social History   Socioeconomic History  . Marital status: Single    Spouse name: Not on file  . Number of children: Not on file  . Years of education: Not on file  . Highest education level: Not on file  Occupational History  . Not on file  Social Needs  . Financial resource strain: Not on file  . Food insecurity:    Worry: Not on file    Inability: Not on file  . Transportation needs:    Medical: Not on file    Non-medical: Not on file  Tobacco Use  . Smoking status: Never Smoker  . Smokeless tobacco: Never Used  Substance and Sexual Activity  . Alcohol use: No  . Drug use: No  . Sexual activity: Not on file  Lifestyle  . Physical activity:    Days per week: Not on file    Minutes per session: Not on file  . Stress: Not on file  Relationships  . Social connections:    Talks on phone: Not on file    Gets together: Not on file    Attends religious service: Not on file    Active member of club or organization: Not on file    Attends meetings of clubs or organizations: Not on file    Relationship status: Not on file  . Intimate partner violence:    Fear of current or ex partner:  Not on file    Emotionally abused: Not on file    Physically abused: Not on file    Forced sexual activity: Not on file  Other Topics Concern  . Not on file  Social History Narrative  . Not on file    FAMILY HISTORY: Family History  Problem Relation Age of Onset  . Depression Mother   . Diabetes Mother   . Heart disease Father   . Cancer - Prostate Brother   . Cancer Brother   . Cancer Sister     ALLERGIES:  is allergic to lotensin [benazepril hcl].  MEDICATIONS:  Current Outpatient Medications  Medication Sig Dispense Refill  . acetaminophen (TYLENOL) 500 MG tablet Take 1,000 mg by mouth every 6 (six) hours as needed for mild pain.     Marland Kitchen acyclovir (ZOVIRAX) 400 MG tablet Take 400 mg by mouth 2 (two) times daily.  5  . amLODipine  (NORVASC) 5 MG tablet Take 5 mg by mouth daily.     . bortezomib IV (VELCADE) 3.5 MG injection Inject into the vein.    . Calcium-Magnesium-Vitamin D (CALCIUM 500 PO) Take by mouth.    . dexamethasone (DECADRON) 4 MG tablet TAKE TWO AND ONE-HALF TABLETS BY MOUTH WEEKLY FOR THREE WEEKS (THREE WEEKS ON, ONE WEEK OFF) IN A ROW AND REPEAT MONTHLY  3  . donepezil (ARICEPT) 10 MG tablet Take 10 mg by mouth every evening.  3  . ferrous sulfate 325 (65 FE) MG tablet Take 325 mg by mouth daily with breakfast.    . lenalidomide (REVLIMID) 15 MG capsule Take 15 mg by mouth daily. For two weeks then off for three weeks. Then follow up doctor    . levothyroxine (SYNTHROID, LEVOTHROID) 25 MCG tablet Take 25 mcg by mouth daily before breakfast.    . losartan (COZAAR) 100 MG tablet Take 100 mg by mouth daily.    . metFORMIN (GLUCOPHAGE) 500 MG tablet Take 250 mg by mouth every other day.    . Multiple Vitamins-Minerals (MULTIVITAMIN WOMEN 50+ PO) Take by mouth.    . potassium chloride (K-DUR,KLOR-CON) 10 MEQ tablet Take 30 mEq by mouth daily.  5   No current facility-administered medications for this visit.     REVIEW OF SYSTEMS:   Constitutional: Denies fevers, chills or abnormal night sweats.  Mild fatigue present. Eyes: Denies blurriness of vision, double vision or watery eyes Ears, nose, mouth, throat, and face: Denies mucositis or sore throat Respiratory: Denies cough, dyspnea or wheezes Cardiovascular: Denies palpitation, chest discomfort or lower extremity swelling Gastrointestinal:  Denies nausea, heartburn or change in bowel habits Skin: Denies abnormal skin rashes Lymphatics: Denies new lymphadenopathy or easy bruising Neurological:Denies numbness, tingling or new weaknesses Behavioral/Psych: Mood is stable, no new changes  All other systems were reviewed with the patient and are negative.  PHYSICAL EXAMINATION: ECOG PERFORMANCE STATUS: 1 - Symptomatic but completely ambulatory  Vitals:    05/24/18 1329  BP: 117/65  Pulse: 82  Resp: 16  Temp: 98.6 F (37 C)  SpO2: 100%   Filed Weights   05/24/18 1329  Weight: 135 lb 4.8 oz (61.4 kg)    GENERAL:alert, no distress and comfortable SKIN: skin color, texture, turgor are normal, no rashes or significant lesions EYES: normal, conjunctiva are pink and non-injected, sclera clear OROPHARYNX:no exudate, no erythema and lips, buccal mucosa, and tongue normal  NECK: supple, thyroid normal size, non-tender, without nodularity LYMPH:  no palpable lymphadenopathy in the cervical, axillary or inguinal LUNGS: clear  to auscultation and percussion with normal breathing effort HEART: regular rate & rhythm and no murmurs and no lower extremity edema ABDOMEN:abdomen soft, non-tender and normal bowel sounds  PSYCH: alert & oriented x 3 with fluent speech   LABORATORY DATA:  I have reviewed the data as listed Lab Results  Component Value Date   WBC 7.3 11/11/2017   HGB 9.0 (L) 11/11/2017   HCT 28.2 (L) 11/11/2017   MCV 92.5 11/11/2017   PLT 118 (L) 11/11/2017     Chemistry      Component Value Date/Time   NA 144 11/11/2017 0403   K 4.0 11/11/2017 0403   CL 116 (H) 11/11/2017 0403   CO2 21 (L) 11/11/2017 0403   BUN 32 (H) 11/11/2017 0403   CREATININE 1.46 (H) 11/11/2017 0403      Component Value Date/Time   CALCIUM 8.3 (L) 11/11/2017 0403   ALKPHOS 119 11/11/2017 0403   AST 46 (H) 11/11/2017 0403   ALT 49 11/11/2017 0403   BILITOT 0.8 11/11/2017 0403      ASSESSMENT & PLAN:  Multiple myeloma (HCC) 1.  IgG lambda multiple myeloma: - Revlimid 19m and dexamethasone 3 weeks on 1 week off started in September 2009, changed to 2 weeks on 2 weeks off in November 2010, changed to 2 weeks on 3 weeks of in May 2012, Revlimid 15 mg 14/35 days and weekly Dex in January 2015, Revlimid 15 mg change to 14/42 days and weekly Dex in June 2017, dexamethasone stopped in November 2017 - Velcade and dexamethasone weekly, 3 weeks on/1 week  off started recently -Dr. LMaylon Peppershas called me to treat this patient locally so that she can follow-up with him periodically.  We will arrange her Velcade and dexamethasone next cycle starting next week.  She is tolerating it very well.  She does not report any neuropathy symptoms.  She will continue acyclovir twice daily.  She was told to start taking aspirin 81 mg daily. -She received Zometa monthly starting September 2009, switch to every 3 months in 2012 and lately off of it.  2.  Remote history of CNS marked like lymphoma, treated with chemotherapy and radiation, last brain MRI was in 2013, CT of the brain in 2018 with no evidence of recurrence.  She does have some dementia from it.  She will continue Aricept 10 mg daily.  No orders of the defined types were placed in this encounter.   All questions were answered. The patient knows to call the clinic with any problems, questions or concerns. Total time spent is 45 minutes with more than 50% of the time spent face-to-face discussing her disease status, and treatment plan and coordination of care.     SDerek Jack MD 05/24/2018 1:57 PM

## 2018-05-25 ENCOUNTER — Telehealth (HOSPITAL_COMMUNITY): Payer: Self-pay | Admitting: General Practice

## 2018-05-25 NOTE — Telephone Encounter (Signed)
[place] Progress Notes  CSW spoke w patient and sister Amanda Wu re needs/concerns after recent visit at [facility].  Sister Amanda Wu is heavily involved w caregiving for patient as well as another family member also diagnosed w cancer.  Another sister is also helping w care but was not participant in today's call.  Per sister, [place]  Discussed options including [employer], [employer] transportation benefit and [place] [facility].  CSW will enroll patient in [facility] and request provider to reach out to patient to discuss upcoming transport needs.  Will mail information about additional local resources including [employer] and [place].  Encouraged patient and sister to contact CSW w needs as they arise, contact information provided.  Amanda Shell, LCSW Clinical Social Worker Phone:  [phone]

## 2018-05-31 ENCOUNTER — Other Ambulatory Visit (HOSPITAL_COMMUNITY): Payer: Self-pay

## 2018-05-31 DIAGNOSIS — C9000 Multiple myeloma not having achieved remission: Secondary | ICD-10-CM

## 2018-06-01 ENCOUNTER — Inpatient Hospital Stay (HOSPITAL_COMMUNITY): Payer: Medicare PPO

## 2018-06-01 ENCOUNTER — Other Ambulatory Visit: Payer: Self-pay

## 2018-06-01 ENCOUNTER — Encounter (HOSPITAL_COMMUNITY): Payer: Self-pay

## 2018-06-01 VITALS — BP 117/67 | HR 70 | Temp 98.6°F | Resp 16 | Wt 136.4 lb

## 2018-06-01 DIAGNOSIS — C9000 Multiple myeloma not having achieved remission: Secondary | ICD-10-CM

## 2018-06-01 DIAGNOSIS — Z5112 Encounter for antineoplastic immunotherapy: Secondary | ICD-10-CM | POA: Diagnosis not present

## 2018-06-01 LAB — CBC WITH DIFFERENTIAL/PLATELET
BASOS ABS: 0 10*3/uL (ref 0.0–0.1)
Basophils Relative: 0 %
EOS ABS: 0.1 10*3/uL (ref 0.0–0.7)
EOS PCT: 1 %
HCT: 29.4 % — ABNORMAL LOW (ref 36.0–46.0)
Hemoglobin: 9.5 g/dL — ABNORMAL LOW (ref 12.0–15.0)
Lymphocytes Relative: 13 %
Lymphs Abs: 0.7 10*3/uL (ref 0.7–4.0)
MCH: 30.2 pg (ref 26.0–34.0)
MCHC: 32.3 g/dL (ref 30.0–36.0)
MCV: 93.3 fL (ref 78.0–100.0)
Monocytes Absolute: 0.4 10*3/uL (ref 0.1–1.0)
Monocytes Relative: 8 %
Neutro Abs: 3.9 10*3/uL (ref 1.7–7.7)
Neutrophils Relative %: 78 %
PLATELETS: 143 10*3/uL — AB (ref 150–400)
RBC: 3.15 MIL/uL — ABNORMAL LOW (ref 3.87–5.11)
RDW: 17.4 % — ABNORMAL HIGH (ref 11.5–15.5)
WBC: 5.1 10*3/uL (ref 4.0–10.5)

## 2018-06-01 LAB — COMPREHENSIVE METABOLIC PANEL
ALBUMIN: 3.1 g/dL — AB (ref 3.5–5.0)
ALT: 17 U/L (ref 14–54)
AST: 30 U/L (ref 15–41)
Alkaline Phosphatase: 41 U/L (ref 38–126)
Anion gap: 7 (ref 5–15)
BUN: 19 mg/dL (ref 6–20)
CHLORIDE: 106 mmol/L (ref 101–111)
CO2: 26 mmol/L (ref 22–32)
CREATININE: 1.24 mg/dL — AB (ref 0.44–1.00)
Calcium: 9 mg/dL (ref 8.9–10.3)
GFR calc Af Amer: 49 mL/min — ABNORMAL LOW (ref >60)
GFR calc non Af Amer: 42 mL/min — ABNORMAL LOW (ref >60)
Glucose, Bld: 158 mg/dL — ABNORMAL HIGH (ref 65–99)
Potassium: 4.1 mmol/L (ref 3.5–5.1)
SODIUM: 139 mmol/L (ref 135–145)
Total Bilirubin: 0.4 mg/dL (ref 0.3–1.2)
Total Protein: 7.8 g/dL (ref 6.5–8.1)

## 2018-06-01 MED ORDER — DEXAMETHASONE 4 MG PO TABS
10.0000 mg | ORAL_TABLET | Freq: Once | ORAL | Status: AC
  Administered 2018-06-01: 10 mg via ORAL
  Filled 2018-06-01: qty 2.5

## 2018-06-01 MED ORDER — PROCHLORPERAZINE MALEATE 10 MG PO TABS: 10.0000 mg | ORAL_TABLET | Freq: Once | ORAL | Status: DC

## 2018-06-01 MED ORDER — BORTEZOMIB CHEMO SQ INJECTION 3.5 MG (2.5MG/ML)
1.0000 mg/m2 | Freq: Once | INTRAMUSCULAR | Status: AC
  Administered 2018-06-01: 1.75 mg via SUBCUTANEOUS
  Filled 2018-06-01: qty 0.7

## 2018-06-01 NOTE — Progress Notes (Signed)
Amanda Wu presents today for injection per the provider's orders.  Velcade administration without incident; see MAR for injection details.  Patient tolerated procedure well and without incident.  No questions or complaints noted at this time.  Discharged ambulatory in c/o family.

## 2018-06-08 ENCOUNTER — Other Ambulatory Visit: Payer: Self-pay

## 2018-06-08 ENCOUNTER — Inpatient Hospital Stay (HOSPITAL_COMMUNITY): Payer: Medicare PPO

## 2018-06-08 ENCOUNTER — Encounter (HOSPITAL_COMMUNITY): Payer: Self-pay

## 2018-06-08 VITALS — BP 122/62 | HR 67 | Temp 98.1°F | Resp 18 | Wt 134.8 lb

## 2018-06-08 DIAGNOSIS — C9000 Multiple myeloma not having achieved remission: Secondary | ICD-10-CM

## 2018-06-08 DIAGNOSIS — Z5112 Encounter for antineoplastic immunotherapy: Secondary | ICD-10-CM | POA: Diagnosis not present

## 2018-06-08 LAB — CBC WITH DIFFERENTIAL/PLATELET
Basophils Absolute: 0 10*3/uL (ref 0.0–0.1)
Basophils Relative: 0 %
Eosinophils Absolute: 0.1 10*3/uL (ref 0.0–0.7)
Eosinophils Relative: 2 %
HEMATOCRIT: 30.5 % — AB (ref 36.0–46.0)
HEMOGLOBIN: 9.7 g/dL — AB (ref 12.0–15.0)
LYMPHS PCT: 16 %
Lymphs Abs: 0.8 10*3/uL (ref 0.7–4.0)
MCH: 30 pg (ref 26.0–34.0)
MCHC: 31.8 g/dL (ref 30.0–36.0)
MCV: 94.4 fL (ref 78.0–100.0)
MONO ABS: 0.5 10*3/uL (ref 0.1–1.0)
MONOS PCT: 10 %
NEUTROS ABS: 3.5 10*3/uL (ref 1.7–7.7)
Neutrophils Relative %: 72 %
Platelets: 124 10*3/uL — ABNORMAL LOW (ref 150–400)
RBC: 3.23 MIL/uL — ABNORMAL LOW (ref 3.87–5.11)
RDW: 17.9 % — AB (ref 11.5–15.5)
WBC: 4.8 10*3/uL (ref 4.0–10.5)

## 2018-06-08 LAB — COMPREHENSIVE METABOLIC PANEL
ALK PHOS: 42 U/L (ref 38–126)
ALT: 18 U/L (ref 0–44)
ANION GAP: 6 (ref 5–15)
AST: 28 U/L (ref 15–41)
Albumin: 3 g/dL — ABNORMAL LOW (ref 3.5–5.0)
BUN: 21 mg/dL (ref 8–23)
CO2: 27 mmol/L (ref 22–32)
Calcium: 9.1 mg/dL (ref 8.9–10.3)
Chloride: 107 mmol/L (ref 98–111)
Creatinine, Ser: 1.27 mg/dL — ABNORMAL HIGH (ref 0.44–1.00)
GFR calc Af Amer: 47 mL/min — ABNORMAL LOW (ref >60)
GFR calc non Af Amer: 41 mL/min — ABNORMAL LOW (ref >60)
GLUCOSE: 128 mg/dL — AB (ref 70–99)
Potassium: 4 mmol/L (ref 3.5–5.1)
Sodium: 140 mmol/L (ref 135–145)
Total Bilirubin: 0.6 mg/dL (ref 0.3–1.2)
Total Protein: 7.8 g/dL (ref 6.5–8.1)

## 2018-06-08 MED ORDER — DEXAMETHASONE 4 MG PO TABS
10.0000 mg | ORAL_TABLET | Freq: Once | ORAL | Status: AC
  Administered 2018-06-08: 10 mg via ORAL
  Filled 2018-06-08: qty 2.5

## 2018-06-08 MED ORDER — BORTEZOMIB CHEMO SQ INJECTION 3.5 MG (2.5MG/ML)
1.0000 mg/m2 | Freq: Once | INTRAMUSCULAR | Status: AC
  Administered 2018-06-08: 1.75 mg via SUBCUTANEOUS
  Filled 2018-06-08: qty 0.7

## 2018-06-08 MED ORDER — PROCHLORPERAZINE MALEATE 10 MG PO TABS: 10.0000 mg | ORAL_TABLET | Freq: Once | ORAL | Status: DC

## 2018-06-08 NOTE — Progress Notes (Signed)
Velcade given today per MD. Treatment given per orders. Patient tolerated it well without problems. Vitals stable and discharged home from clinic ambulatory. Follow up as scheduled.

## 2018-06-08 NOTE — Patient Instructions (Signed)
[facility] Discharge Instructions for Patients Receiving Chemotherapy   Beginning January 23rd 2017 lab work for the Cancer Center will be done in the  Main lab at [place] on 1st floor. If you have a lab appointment with the Cancer Center please come in thru the  Main Entrance and check in at the main information desk   Today you received the following chemotherapy agents   To help prevent nausea and vomiting after your treatment, we encourage you to take your nausea medication     If you develop nausea and vomiting, or diarrhea that is not controlled by your medication, call the clinic.  The clinic phone number is [phone]. Office hours are Monday-Friday 8:30am-5:00pm.  BELOW ARE SYMPTOMS THAT SHOULD BE REPORTED IMMEDIATELY:  *FEVER GREATER THAN 101.0 F  *CHILLS WITH OR WITHOUT FEVER  NAUSEA AND VOMITING THAT IS NOT CONTROLLED WITH YOUR NAUSEA MEDICATION  *UNUSUAL SHORTNESS OF BREATH  *UNUSUAL BRUISING OR BLEEDING  TENDERNESS IN MOUTH AND THROAT WITH OR WITHOUT PRESENCE OF ULCERS  *URINARY PROBLEMS  *BOWEL PROBLEMS  UNUSUAL RASH Items with * indicate a potential emergency and should be followed up as soon as possible. If you have an emergency after office hours please contact your primary care physician or go to the nearest emergency department.  Please call the clinic during office hours if you have any questions or concerns.   You may also contact the Patient Navigator at [phone] should you have any questions or need assistance in obtaining follow up care.      Resources For Cancer Patients and their Caregivers ? American Cancer Society: Can assist with transportation, wigs, general needs, runs Look Good Feel Better.        1-888-227-6333 ? Cancer Care: Provides financial assistance, online support groups, medication/co-pay assistance.  1-800-813-HOPE (4673) ? [facility] Assists [place] cancer  patients and their families through emotional , educational and financial support.  336-427-4357 ? [place] DSS Where to apply for food stamps, Medicaid and utility assistance. 336-342-1394 ? [employer]: Transportation to medical appointments. 336-347-2287 ? Social Security Administration: May apply for disability if have a Stage IV cancer. 336-342-7796 1-800-772-1213 ? [place] Aging, Disability and Transit Services: Assists with nutrition, care and transit needs. 336-349-2343         

## 2018-06-15 ENCOUNTER — Encounter (HOSPITAL_COMMUNITY): Payer: Self-pay

## 2018-06-15 ENCOUNTER — Inpatient Hospital Stay (HOSPITAL_COMMUNITY): Payer: Medicare PPO | Attending: Hematology

## 2018-06-15 ENCOUNTER — Other Ambulatory Visit: Payer: Self-pay

## 2018-06-15 ENCOUNTER — Inpatient Hospital Stay (HOSPITAL_COMMUNITY): Payer: Medicare PPO

## 2018-06-15 VITALS — BP 129/59 | HR 72 | Temp 98.4°F | Resp 18 | Wt 135.2 lb

## 2018-06-15 DIAGNOSIS — Z8572 Personal history of non-Hodgkin lymphomas: Secondary | ICD-10-CM | POA: Insufficient documentation

## 2018-06-15 DIAGNOSIS — C9000 Multiple myeloma not having achieved remission: Secondary | ICD-10-CM

## 2018-06-15 DIAGNOSIS — Z5112 Encounter for antineoplastic immunotherapy: Secondary | ICD-10-CM | POA: Diagnosis not present

## 2018-06-15 LAB — CBC WITH DIFFERENTIAL/PLATELET
Basophils Absolute: 0 10*3/uL (ref 0.0–0.1)
Basophils Relative: 0 %
Eosinophils Absolute: 0.1 10*3/uL (ref 0.0–0.7)
Eosinophils Relative: 2 %
HEMATOCRIT: 30.7 % — AB (ref 36.0–46.0)
HEMOGLOBIN: 9.9 g/dL — AB (ref 12.0–15.0)
Lymphocytes Relative: 15 %
Lymphs Abs: 0.8 10*3/uL (ref 0.7–4.0)
MCH: 30.3 pg (ref 26.0–34.0)
MCHC: 32.2 g/dL (ref 30.0–36.0)
MCV: 93.9 fL (ref 78.0–100.0)
MONO ABS: 0.4 10*3/uL (ref 0.1–1.0)
MONOS PCT: 8 %
NEUTROS ABS: 3.8 10*3/uL (ref 1.7–7.7)
NEUTROS PCT: 75 %
Platelets: 110 10*3/uL — ABNORMAL LOW (ref 150–400)
RBC: 3.27 MIL/uL — ABNORMAL LOW (ref 3.87–5.11)
RDW: 17.7 % — ABNORMAL HIGH (ref 11.5–15.5)
WBC: 5.1 10*3/uL (ref 4.0–10.5)

## 2018-06-15 LAB — COMPREHENSIVE METABOLIC PANEL
ALT: 37 U/L (ref 0–44)
AST: 48 U/L — ABNORMAL HIGH (ref 15–41)
Albumin: 3 g/dL — ABNORMAL LOW (ref 3.5–5.0)
Alkaline Phosphatase: 51 U/L (ref 38–126)
Anion gap: 7 (ref 5–15)
BUN: 17 mg/dL (ref 8–23)
CHLORIDE: 104 mmol/L (ref 98–111)
CO2: 28 mmol/L (ref 22–32)
CREATININE: 1.12 mg/dL — AB (ref 0.44–1.00)
Calcium: 9.2 mg/dL (ref 8.9–10.3)
GFR, EST AFRICAN AMERICAN: 55 mL/min — AB (ref >60)
GFR, EST NON AFRICAN AMERICAN: 48 mL/min — AB (ref >60)
Glucose, Bld: 141 mg/dL — ABNORMAL HIGH (ref 70–99)
POTASSIUM: 4.1 mmol/L (ref 3.5–5.1)
SODIUM: 139 mmol/L (ref 135–145)
Total Bilirubin: 0.5 mg/dL (ref 0.3–1.2)
Total Protein: 7.9 g/dL (ref 6.5–8.1)

## 2018-06-15 MED ORDER — DEXAMETHASONE 4 MG PO TABS
10.0000 mg | ORAL_TABLET | Freq: Once | ORAL | Status: DC
  Filled 2018-06-15: qty 1

## 2018-06-15 MED ORDER — BORTEZOMIB CHEMO SQ INJECTION 3.5 MG (2.5MG/ML)
1.0000 mg/m2 | Freq: Once | INTRAMUSCULAR | Status: AC
  Administered 2018-06-15: 1.75 mg via SUBCUTANEOUS
  Filled 2018-06-15: qty 0.7

## 2018-06-15 MED ORDER — PROCHLORPERAZINE MALEATE 10 MG PO TABS: 10.0000 mg | ORAL_TABLET | Freq: Once | ORAL | Status: DC

## 2018-06-15 MED ORDER — DEXAMETHASONE 6 MG PO TABS
10.0000 mg | ORAL_TABLET | Freq: Once | ORAL | Status: AC
  Administered 2018-06-15: 10 mg via ORAL
  Filled 2018-06-15 (×2): qty 1

## 2018-06-15 NOTE — Progress Notes (Signed)
Amanda Wu presents today for injection per the provider's orders.  Velcade administration without incident; see MAR for injection details.  Patient tolerated procedure well and without incident.  No questions or complaints noted at this time.  Discharged ambulatory in c/o family.

## 2018-06-22 ENCOUNTER — Encounter (HOSPITAL_COMMUNITY): Payer: Self-pay | Admitting: Hematology

## 2018-06-22 ENCOUNTER — Other Ambulatory Visit: Payer: Self-pay

## 2018-06-22 ENCOUNTER — Inpatient Hospital Stay (HOSPITAL_COMMUNITY): Payer: Medicare PPO | Admitting: Hematology

## 2018-06-22 VITALS — BP 128/73 | HR 75 | Temp 98.7°F | Resp 18 | Wt 134.2 lb

## 2018-06-22 DIAGNOSIS — C9000 Multiple myeloma not having achieved remission: Secondary | ICD-10-CM | POA: Diagnosis not present

## 2018-06-22 DIAGNOSIS — Z8572 Personal history of non-Hodgkin lymphomas: Secondary | ICD-10-CM | POA: Diagnosis not present

## 2018-06-22 DIAGNOSIS — Z5112 Encounter for antineoplastic immunotherapy: Secondary | ICD-10-CM | POA: Diagnosis not present

## 2018-06-22 NOTE — Progress Notes (Signed)
[place], [place] [address]   CLINIC:  Medical Oncology/Hematology  PCP:  Monico Blitz, MD [place] [address] [phone]   REASON FOR VISIT:  Follow-up for multiple myeloma  CURRENT THERAPY: Velcade and dexamethasone 3 weeks on 1 week off   BRIEF ONCOLOGIC HISTORY:    Multiple myeloma ([place])   07/09/2017 Initial Diagnosis    Multiple myeloma ([place])      05/31/2018 -  Chemotherapy    The patient had bortezomib SQ (VELCADE) chemo injection 1.75 mg, 1 mg/m2 = 1.75 mg (100 % of original dose 1 mg/m2), Subcutaneous,  Once, 1 of 6 cycles Dose modification: 1 mg/m2 (original dose 1 mg/m2, Cycle 1, Reason: Provider Judgment) Administration: 1.75 mg (06/01/2018), 1.75 mg (06/08/2018), 1.75 mg (06/15/2018)  for chemotherapy treatment.         CANCER STAGING: Cancer Staging No matching staging information was found for the patient.   INTERVAL HISTORY:  [name] 72 y.o. female returns for routine follow-up for mutilple myeloma. This week is her off week from her injection. Patient is brought today by her sister. Patient states she is doing well with treatment no complaints. Patients memory is still getting worse. She continues her medication. Patient appatite is great 100%. Her energy level is at 50%. Her weight continues to stay stable with only a pound of weight loss since last week. Denies nausea,vomiting, or diarrhea. Denies any new pains. Denies any fevers or infections. Denies numbness or tingling. Overall she feels great.   REVIEW OF SYSTEMS:  Review of Systems  Constitutional: Positive for fatigue.  HENT:  Negative.   Eyes: Negative.   Respiratory: Negative.   Cardiovascular: Negative.   Gastrointestinal: Negative.   Endocrine: Negative.   Genitourinary: Negative.    Musculoskeletal: Negative.   Skin: Negative.   Neurological: Negative.   Hematological: Negative.   Psychiatric/Behavioral: Negative.      PAST  MEDICAL/SURGICAL HISTORY:  Past Medical History:  Diagnosis Date  . Benign hypertension   . Cataract   . 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 Diabetes mellitus without complication ([place])   . H/O partial nephrectomy   . Hypokalemia   . Hypothyroidism   . Impaired cognition   . Multiple myeloma (HCC)    Dr Maylon Peppers, [facility]  . Thyroid disease    Past Surgical History:  Procedure Laterality Date  . ABDOMINAL HYSTERECTOMY    . CRANIOTOMY     for lymphoma  . YAG LASER APPLICATION Right 7/62/2633   Procedure: YAG LASER APPLICATION;  Surgeon: [name], MD;  Location: [facility];  Service: Ophthalmology;  Laterality: Right;     SOCIAL HISTORY:  Social History   Socioeconomic History  . Marital status: Single    Spouse name: Not on file  . Number of children: Not on file  . Years of education: Not on file  . Highest education level: Not on file  Occupational History  . Not on file  Social Needs  . Financial resource strain: Not on file  . Food insecurity:    Worry: Not on file    Inability: Not on file  . Transportation needs:    Medical: Not on file    Non-medical: Not on file  Tobacco Use  . Smoking status: Never Smoker  . Smokeless tobacco: Never Used  Substance and Sexual Activity  . Alcohol use: No  . Drug use: No  . Sexual activity: Not on file  Lifestyle  . Physical activity:    Days per week: Not on file    Minutes per session: Not on file  . Stress: Not on file  Relationships  . Social connections:    Talks on phone: Not on file    Gets together: Not on file    Attends religious service: Not on file    Active member of club or organization: Not on file    Attends meetings of clubs or organizations: Not on file    Relationship status: Not on file  . Intimate partner violence:    Fear of current or ex partner: Not on file    Emotionally abused: Not on file    Physically abused: Not on file    Forced sexual activity: Not on file  Other Topics  Concern  . Not on file  Social History Narrative  . Not on file    FAMILY HISTORY:  Family History  Problem Relation Age of Onset  . Depression Mother   . Diabetes Mother   . Heart disease Father   . Cancer - Prostate Brother   . Cancer Brother   . Cancer Sister     CURRENT MEDICATIONS:  Outpatient Encounter Medications as of 06/22/2018  Medication Sig Note  . acetaminophen (TYLENOL) 500 MG tablet Take 1,000 mg by mouth every 6 (six) hours as needed for mild pain.    Marland Kitchen acyclovir (ZOVIRAX) 400 MG tablet Take 400 mg by mouth 2 (two) times daily.   Marland Kitchen amLODipine (NORVASC) 5 MG tablet Take 5 mg by mouth daily.    . bortezomib IV (VELCADE) 3.5 MG injection Inject into the vein.   . calcium-vitamin D (OSCAL WITH D) 500-200 MG-UNIT tablet Take 1 tablet by mouth daily with breakfast.   . dexamethasone (DECADRON) 4 MG tablet TAKE TWO AND ONE-HALF TABLETS BY MOUTH WEEKLY FOR THREE WEEKS (THREE WEEKS ON, ONE WEEK OFF) IN A ROW AND REPEAT MONTHLY 06/22/2018: Patient states that she is given the dexamethasone while here getting her velcade injection  . donepezil (ARICEPT) 10 MG tablet Take 10 mg by mouth every evening.   . ferrous sulfate 325 (65 FE) MG tablet Take 325 mg by mouth daily with breakfast.   . levothyroxine (SYNTHROID, LEVOTHROID) 25 MCG tablet Take 25 mcg by mouth daily before breakfast.   . losartan (COZAAR) 100 MG tablet Take 100 mg by mouth daily.   . metFORMIN (GLUCOPHAGE) 500 MG tablet Take 250 mg by mouth every other day.   . Multiple Vitamins-Minerals (MULTIVITAMIN WOMEN 50+ PO) Take by mouth.   . potassium chloride (K-DUR,KLOR-CON) 10 MEQ tablet Take 30 mEq by mouth daily.   . [DISCONTINUED] Calcium-Magnesium-Vitamin D (CALCIUM 500 PO) Take by mouth.   . [DISCONTINUED] lenalidomide (REVLIMID) 15 MG capsule Take 15 mg by mouth daily. For two weeks then off for three weeks. Then follow up doctor    No facility-administered encounter medications on file as of 06/22/2018.      ALLERGIES:  Allergies  Allergen Reactions  . Lotensin [Benazepril Hcl]     Facial swelling (Angioedema) Note: ARBs should also be contraindicated, but she is on Losartan!     PHYSICAL EXAM:  ECOG Performance status: 1  Vitals:   06/22/18 1002  BP: 128/73  Pulse: 75  Resp: 18  Temp: 98.7 F (37.1 C)  SpO2: 100%   Filed Weights   06/22/18 1002  Weight: 134 lb 3.2 oz (60.9 kg)    Physical Exam   LABORATORY DATA:  I have reviewed the labs as listed.  CBC    Component Value Date/Time   WBC 5.1 06/15/2018 1028   RBC 3.27 (L) 06/15/2018 1028   HGB 9.9 (L) 06/15/2018 1028   HCT 30.7 (L) 06/15/2018 1028   PLT 110 (L) 06/15/2018 1028   MCV 93.9 06/15/2018 1028   MCH 30.3 06/15/2018 1028   MCHC 32.2 06/15/2018 1028   RDW 17.7 (H) 06/15/2018 1028   LYMPHSABS 0.8 06/15/2018 1028   MONOABS 0.4 06/15/2018 1028   EOSABS 0.1 06/15/2018 1028   BASOSABS 0.0 06/15/2018 1028   CMP Latest Ref Rng & Units 06/15/2018 06/08/2018 06/01/2018  Glucose 70 - 99 mg/dL 141(H) 128(H) 158(H)  BUN 8 - 23 mg/dL '17 21 19  ' Creatinine 0.44 - 1.00 mg/dL 1.12(H) 1.27(H) 1.24(H)  Sodium 135 - 145 mmol/L 139 140 139  Potassium 3.5 - 5.1 mmol/L 4.1 4.0 4.1  Chloride 98 - 111 mmol/L 104 107 106  CO2 22 - 32 mmol/L '28 27 26  ' Calcium 8.9 - 10.3 mg/dL 9.2 9.1 9.0  Total Protein 6.5 - 8.1 g/dL 7.9 7.8 7.8  Total Bilirubin 0.3 - 1.2 mg/dL 0.5 0.6 0.4  Alkaline Phos 38 - 126 U/L 51 42 41  AST 15 - 41 U/L 48(H) 28 30  ALT 0 - 44 U/L 37 18 17       ASSESSMENT & PLAN:   Multiple myeloma (HCC) 1.  IgG lambda multiple myeloma: - Revlimid 59m and dexamethasone 3 weeks on 1 week off started in September 2009, changed to 2 weeks on 2 weeks off in November 2010, changed to 2 weeks on 3 weeks of in May 2012, Revlimid 15 mg 14/35 days and weekly Dex in January 2015, Revlimid 15 mg change to 14/42 days and weekly Dex in June 2017, dexamethasone stopped in November 2017 - Velcade and dexamethasone weekly,  3 weeks on/1 week off started recently -Dr. LMaylon Peppershas called me to treat this patient locally so that she can follow-up with him periodically.  -She received Zometa monthly starting September 2009, switch to every 3 months in 2012 and lately off of it. - She was started on Velcade 1 mg/m, 3 weeks on 1 week off on 06/01/2018.  Today is her off week.  She has tolerated it very well.  Denies any neuropathy symptoms.  Takes dexamethasone 10 mg on days of Velcade. - She will start her next cycle on 06/29/2018.  We will send SPEP, free light chains and immunofixation next week.  She will come back to see me on 07/27/2018.  She will follow-up with Dr. lesser in the next few months.  2.  Remote history of CNS MALT like lymphoma, treated with chemotherapy and radiation, last brain MRI was in 2013, CT of the brain in 2018 with no evidence of recurrence.  She does have some dementia from it.  She will continue Aricept 10 mg daily.      Orders placed this encounter:  Orders Placed This Encounter  Procedures  . CBC with Differential/Platelet  . Comprehensive metabolic panel  . Protein electrophoresis, serum  . Kappa/lambda light chains      SDerek Jack [phone]

## 2018-06-22 NOTE — Assessment & Plan Note (Signed)
1.  IgG lambda multiple myeloma: - Revlimid 100m and dexamethasone 3 weeks on 1 week off started in September 2009, changed to 2 weeks on 2 weeks off in November 2010, changed to 2 weeks on 3 weeks of in May 2012, Revlimid 15 mg 14/35 days and weekly Dex in January 2015, Revlimid 15 mg change to 14/42 days and weekly Dex in June 2017, dexamethasone stopped in November 2017 - Velcade and dexamethasone weekly, 3 weeks on/1 week off started recently -Dr. LMaylon Peppershas called me to treat this patient locally so that she can follow-up with him periodically.  -She received Zometa monthly starting September 2009, switch to every 3 months in 2012 and lately off of it. - She was started on Velcade 1 mg/m, 3 weeks on 1 week off on 06/01/2018.  Today is her off week.  She has tolerated it very well.  Denies any neuropathy symptoms.  Takes dexamethasone 10 mg on days of Velcade. - She will start her next cycle on 06/29/2018.  We will send SPEP, free light chains and immunofixation next week.  She will come back to see me on 07/27/2018.  She will follow-up with Dr. lesser in the next few months.  2.  Remote history of CNS MALT like lymphoma, treated with chemotherapy and radiation, last brain MRI was in 2013, CT of the brain in 2018 with no evidence of recurrence.  She does have some dementia from it.  She will continue Aricept 10 mg daily.

## 2018-06-29 ENCOUNTER — Inpatient Hospital Stay (HOSPITAL_COMMUNITY): Payer: Medicare PPO

## 2018-06-29 ENCOUNTER — Encounter (HOSPITAL_COMMUNITY): Payer: Self-pay

## 2018-06-29 ENCOUNTER — Other Ambulatory Visit: Payer: Self-pay

## 2018-06-29 VITALS — BP 125/64 | HR 62 | Temp 98.2°F | Resp 18 | Wt 134.8 lb

## 2018-06-29 DIAGNOSIS — Z5112 Encounter for antineoplastic immunotherapy: Secondary | ICD-10-CM | POA: Diagnosis not present

## 2018-06-29 DIAGNOSIS — C9000 Multiple myeloma not having achieved remission: Secondary | ICD-10-CM

## 2018-06-29 DIAGNOSIS — Z8572 Personal history of non-Hodgkin lymphomas: Secondary | ICD-10-CM | POA: Diagnosis not present

## 2018-06-29 LAB — CBC WITH DIFFERENTIAL/PLATELET
BASOS ABS: 0 10*3/uL (ref 0.0–0.1)
BASOS PCT: 1 %
Eosinophils Absolute: 0.1 10*3/uL (ref 0.0–0.7)
Eosinophils Relative: 2 %
HEMATOCRIT: 30 % — AB (ref 36.0–46.0)
Hemoglobin: 9.9 g/dL — ABNORMAL LOW (ref 12.0–15.0)
Lymphocytes Relative: 19 %
Lymphs Abs: 0.8 10*3/uL (ref 0.7–4.0)
MCH: 30.7 pg (ref 26.0–34.0)
MCHC: 33 g/dL (ref 30.0–36.0)
MCV: 93.2 fL (ref 78.0–100.0)
MONO ABS: 0.5 10*3/uL (ref 0.1–1.0)
Monocytes Relative: 11 %
NEUTROS ABS: 2.9 10*3/uL (ref 1.7–7.7)
NEUTROS PCT: 67 %
Platelets: 159 10*3/uL (ref 150–400)
RBC: 3.22 MIL/uL — ABNORMAL LOW (ref 3.87–5.11)
RDW: 17.5 % — AB (ref 11.5–15.5)
WBC: 4.3 10*3/uL (ref 4.0–10.5)

## 2018-06-29 LAB — COMPREHENSIVE METABOLIC PANEL
ALBUMIN: 3.1 g/dL — AB (ref 3.5–5.0)
ALT: 16 U/L (ref 0–44)
AST: 28 U/L (ref 15–41)
Alkaline Phosphatase: 44 U/L (ref 38–126)
Anion gap: 6 (ref 5–15)
BILIRUBIN TOTAL: 0.5 mg/dL (ref 0.3–1.2)
BUN: 21 mg/dL (ref 8–23)
CALCIUM: 9.2 mg/dL (ref 8.9–10.3)
CO2: 27 mmol/L (ref 22–32)
CREATININE: 1.27 mg/dL — AB (ref 0.44–1.00)
Chloride: 107 mmol/L (ref 98–111)
GFR calc Af Amer: 47 mL/min — ABNORMAL LOW (ref >60)
GFR calc non Af Amer: 41 mL/min — ABNORMAL LOW (ref >60)
GLUCOSE: 79 mg/dL (ref 70–99)
Potassium: 4.2 mmol/L (ref 3.5–5.1)
SODIUM: 140 mmol/L (ref 135–145)
Total Protein: 8.2 g/dL — ABNORMAL HIGH (ref 6.5–8.1)

## 2018-06-29 MED ORDER — PROCHLORPERAZINE MALEATE 10 MG PO TABS: 10.0000 mg | ORAL_TABLET | Freq: Once | ORAL | Status: DC

## 2018-06-29 MED ORDER — BORTEZOMIB CHEMO SQ INJECTION 3.5 MG (2.5MG/ML)
1.0000 mg/m2 | Freq: Once | INTRAMUSCULAR | Status: AC
  Administered 2018-06-29: 1.75 mg via SUBCUTANEOUS
  Filled 2018-06-29: qty 0.7

## 2018-06-29 MED ORDER — DEXAMETHASONE 4 MG PO TABS
10.0000 mg | ORAL_TABLET | Freq: Once | ORAL | Status: AC
  Administered 2018-06-29: 10 mg via ORAL
  Filled 2018-06-29: qty 2.5

## 2018-06-29 NOTE — Progress Notes (Signed)
Amanda Wu presents today for injection per the provider's orders.  Velcade administration without incident; see MAR for injection details.  Patient tolerated procedure well and without incident.  No questions or complaints noted at this time.  Discharged ambulatory in c/o family.

## 2018-06-30 MED ORDER — CYANOCOBALAMIN 1000 MCG/ML IJ SOLN
INTRAMUSCULAR | Status: AC
  Filled 2018-06-30: qty 1

## 2018-07-06 ENCOUNTER — Inpatient Hospital Stay (HOSPITAL_COMMUNITY): Payer: Medicare PPO

## 2018-07-06 ENCOUNTER — Encounter (HOSPITAL_COMMUNITY): Payer: Self-pay

## 2018-07-06 ENCOUNTER — Other Ambulatory Visit: Payer: Self-pay

## 2018-07-06 VITALS — BP 136/72 | HR 65 | Temp 98.8°F | Resp 18 | Wt 134.2 lb

## 2018-07-06 DIAGNOSIS — C9000 Multiple myeloma not having achieved remission: Secondary | ICD-10-CM | POA: Diagnosis not present

## 2018-07-06 DIAGNOSIS — Z5112 Encounter for antineoplastic immunotherapy: Secondary | ICD-10-CM | POA: Diagnosis not present

## 2018-07-06 DIAGNOSIS — Z8572 Personal history of non-Hodgkin lymphomas: Secondary | ICD-10-CM | POA: Diagnosis not present

## 2018-07-06 LAB — COMPREHENSIVE METABOLIC PANEL
ALK PHOS: 44 U/L (ref 38–126)
ALT: 15 U/L (ref 0–44)
AST: 30 U/L (ref 15–41)
Albumin: 3.3 g/dL — ABNORMAL LOW (ref 3.5–5.0)
Anion gap: 5 (ref 5–15)
BILIRUBIN TOTAL: 0.6 mg/dL (ref 0.3–1.2)
BUN: 18 mg/dL (ref 8–23)
CALCIUM: 9.7 mg/dL (ref 8.9–10.3)
CHLORIDE: 105 mmol/L (ref 98–111)
CO2: 30 mmol/L (ref 22–32)
CREATININE: 1.33 mg/dL — AB (ref 0.44–1.00)
GFR calc non Af Amer: 39 mL/min — ABNORMAL LOW (ref >60)
GFR, EST AFRICAN AMERICAN: 45 mL/min — AB (ref >60)
Glucose, Bld: 88 mg/dL (ref 70–99)
Potassium: 4.2 mmol/L (ref 3.5–5.1)
Sodium: 140 mmol/L (ref 135–145)
TOTAL PROTEIN: 8.4 g/dL — AB (ref 6.5–8.1)

## 2018-07-06 LAB — CBC WITH DIFFERENTIAL/PLATELET
BASOS ABS: 0 10*3/uL (ref 0.0–0.1)
Basophils Relative: 0 %
EOS PCT: 1 %
Eosinophils Absolute: 0.1 10*3/uL (ref 0.0–0.7)
HEMATOCRIT: 31.7 % — AB (ref 36.0–46.0)
HEMOGLOBIN: 10.5 g/dL — AB (ref 12.0–15.0)
LYMPHS ABS: 1 10*3/uL (ref 0.7–4.0)
LYMPHS PCT: 18 %
MCH: 30.5 pg (ref 26.0–34.0)
MCHC: 33.1 g/dL (ref 30.0–36.0)
MCV: 92.2 fL (ref 78.0–100.0)
Monocytes Absolute: 0.5 10*3/uL (ref 0.1–1.0)
Monocytes Relative: 9 %
NEUTROS ABS: 4.1 10*3/uL (ref 1.7–7.7)
Neutrophils Relative %: 72 %
Platelets: 104 10*3/uL — ABNORMAL LOW (ref 150–400)
RBC: 3.44 MIL/uL — AB (ref 3.87–5.11)
RDW: 17.5 % — ABNORMAL HIGH (ref 11.5–15.5)
WBC: 5.6 10*3/uL (ref 4.0–10.5)

## 2018-07-06 MED ORDER — BORTEZOMIB CHEMO SQ INJECTION 3.5 MG (2.5MG/ML)
1.0000 mg/m2 | Freq: Once | INTRAMUSCULAR | Status: AC
  Administered 2018-07-06: 1.75 mg via SUBCUTANEOUS
  Filled 2018-07-06: qty 0.7

## 2018-07-06 MED ORDER — DEXAMETHASONE 4 MG PO TABS
10.0000 mg | ORAL_TABLET | Freq: Once | ORAL | Status: AC
  Administered 2018-07-06: 10 mg via ORAL
  Filled 2018-07-06: qty 2.5

## 2018-07-06 MED ORDER — PROCHLORPERAZINE MALEATE 10 MG PO TABS
10.0000 mg | ORAL_TABLET | Freq: Once | ORAL | Status: DC
  Filled 2018-07-06: qty 1

## 2018-07-06 NOTE — Progress Notes (Signed)
Labs reviewed with Dr. Delton Coombes, he noted the creatinine is 1.33, and platelets are 104. Proceed with treatment today per MD.  Velcade given per orders. Patient tolerated it well without problems. Vitals stable and discharged home from clinic ambulatory. Follow up as scheduled.

## 2018-07-06 NOTE — Patient Instructions (Signed)
[facility] Discharge Instructions for Patients Receiving Chemotherapy   Beginning January 23rd 2017 lab work for the Cancer Center will be done in the  Main lab at [place] on 1st floor. If you have a lab appointment with the Cancer Center please come in thru the  Main Entrance and check in at the main information desk   Today you received the following chemotherapy agents   To help prevent nausea and vomiting after your treatment, we encourage you to take your nausea medication     If you develop nausea and vomiting, or diarrhea that is not controlled by your medication, call the clinic.  The clinic phone number is [phone]. Office hours are Monday-Friday 8:30am-5:00pm.  BELOW ARE SYMPTOMS THAT SHOULD BE REPORTED IMMEDIATELY:  *FEVER GREATER THAN 101.0 F  *CHILLS WITH OR WITHOUT FEVER  NAUSEA AND VOMITING THAT IS NOT CONTROLLED WITH YOUR NAUSEA MEDICATION  *UNUSUAL SHORTNESS OF BREATH  *UNUSUAL BRUISING OR BLEEDING  TENDERNESS IN MOUTH AND THROAT WITH OR WITHOUT PRESENCE OF ULCERS  *URINARY PROBLEMS  *BOWEL PROBLEMS  UNUSUAL RASH Items with * indicate a potential emergency and should be followed up as soon as possible. If you have an emergency after office hours please contact your primary care physician or go to the nearest emergency department.  Please call the clinic during office hours if you have any questions or concerns.   You may also contact the Patient Navigator at [phone] should you have any questions or need assistance in obtaining follow up care.      Resources For Cancer Patients and their Caregivers ? American Cancer Society: Can assist with transportation, wigs, general needs, runs Look Good Feel Better.        1-888-227-6333 ? Cancer Care: Provides financial assistance, online support groups, medication/co-pay assistance.  1-800-813-HOPE (4673) ? [facility] Assists [place] cancer  patients and their families through emotional , educational and financial support.  336-427-4357 ? [place] DSS Where to apply for food stamps, Medicaid and utility assistance. 336-342-1394 ? [facility]: Transportation to medical appointments. 336-347-2287 ? Social Security Administration: May apply for disability if have a Stage IV cancer. 336-342-7796 1-800-772-1213 ? [place] Aging, Disability and Transit Services: Assists with nutrition, care and transit needs. 336-349-2343         

## 2018-07-13 ENCOUNTER — Encounter (HOSPITAL_COMMUNITY): Payer: Self-pay

## 2018-07-13 ENCOUNTER — Inpatient Hospital Stay (HOSPITAL_COMMUNITY): Payer: Medicare PPO

## 2018-07-13 ENCOUNTER — Ambulatory Visit (HOSPITAL_COMMUNITY): Payer: Medicare PPO | Admitting: Hematology

## 2018-07-13 VITALS — BP 123/66 | HR 67 | Temp 98.4°F | Resp 18 | Wt 133.2 lb

## 2018-07-13 DIAGNOSIS — Z5112 Encounter for antineoplastic immunotherapy: Secondary | ICD-10-CM | POA: Diagnosis not present

## 2018-07-13 DIAGNOSIS — Z8572 Personal history of non-Hodgkin lymphomas: Secondary | ICD-10-CM | POA: Diagnosis not present

## 2018-07-13 DIAGNOSIS — C9000 Multiple myeloma not having achieved remission: Secondary | ICD-10-CM

## 2018-07-13 LAB — COMPREHENSIVE METABOLIC PANEL
ALBUMIN: 3.1 g/dL — AB (ref 3.5–5.0)
ALK PHOS: 38 U/L (ref 38–126)
ALT: 16 U/L (ref 0–44)
AST: 29 U/L (ref 15–41)
Anion gap: 8 (ref 5–15)
BILIRUBIN TOTAL: 0.3 mg/dL (ref 0.3–1.2)
BUN: 21 mg/dL (ref 8–23)
CALCIUM: 9.3 mg/dL (ref 8.9–10.3)
CO2: 27 mmol/L (ref 22–32)
Chloride: 107 mmol/L (ref 98–111)
Creatinine, Ser: 1.26 mg/dL — ABNORMAL HIGH (ref 0.44–1.00)
GFR calc Af Amer: 48 mL/min — ABNORMAL LOW (ref >60)
GFR calc non Af Amer: 41 mL/min — ABNORMAL LOW (ref >60)
GLUCOSE: 128 mg/dL — AB (ref 70–99)
Potassium: 3.9 mmol/L (ref 3.5–5.1)
SODIUM: 142 mmol/L (ref 135–145)
TOTAL PROTEIN: 8 g/dL (ref 6.5–8.1)

## 2018-07-13 LAB — CBC WITH DIFFERENTIAL/PLATELET
Basophils Absolute: 0 10*3/uL (ref 0.0–0.1)
Basophils Relative: 0 %
EOS ABS: 0.1 10*3/uL (ref 0.0–0.7)
EOS PCT: 2 %
HCT: 31 % — ABNORMAL LOW (ref 36.0–46.0)
Hemoglobin: 10 g/dL — ABNORMAL LOW (ref 12.0–15.0)
LYMPHS ABS: 0.7 10*3/uL (ref 0.7–4.0)
Lymphocytes Relative: 12 %
MCH: 30 pg (ref 26.0–34.0)
MCHC: 32.3 g/dL (ref 30.0–36.0)
MCV: 93.1 fL (ref 78.0–100.0)
MONOS PCT: 7 %
Monocytes Absolute: 0.4 10*3/uL (ref 0.1–1.0)
Neutro Abs: 4.5 10*3/uL (ref 1.7–7.7)
Neutrophils Relative %: 79 %
PLATELETS: 94 10*3/uL — AB (ref 150–400)
RBC: 3.33 MIL/uL — ABNORMAL LOW (ref 3.87–5.11)
RDW: 17.8 % — AB (ref 11.5–15.5)
WBC: 5.7 10*3/uL (ref 4.0–10.5)

## 2018-07-13 MED ORDER — DEXAMETHASONE 4 MG PO TABS
10.0000 mg | ORAL_TABLET | Freq: Once | ORAL | Status: AC
  Administered 2018-07-13: 10 mg via ORAL
  Filled 2018-07-13: qty 2.5

## 2018-07-13 MED ORDER — PROCHLORPERAZINE MALEATE 10 MG PO TABS: 10.0000 mg | ORAL_TABLET | Freq: Once | ORAL | Status: DC

## 2018-07-13 MED ORDER — BORTEZOMIB CHEMO SQ INJECTION 3.5 MG (2.5MG/ML)
1.0000 mg/m2 | Freq: Once | INTRAMUSCULAR | Status: AC
  Administered 2018-07-13: 1.75 mg via SUBCUTANEOUS
  Filled 2018-07-13: qty 0.7

## 2018-07-13 MED ORDER — DEXAMETHASONE 6 MG PO TABS
10.0000 mg | ORAL_TABLET | Freq: Once | ORAL | Status: DC
  Filled 2018-07-13: qty 1

## 2018-07-13 NOTE — Progress Notes (Signed)
1120 Labs reviewed with Dr. Vickey Huger and pt approved for Velcade injection today per MD                 Ed[facility] House Morales tolerated Velcade injection well without complaints or incident. VSS Pt discharged self ambulatory in satisfactory condition accompanied by family member

## 2018-07-13 NOTE — Patient Instructions (Signed)
[facility] Discharge Instructions for Patients Receiving Chemotherapy   Beginning January 23rd 2017 lab work for the Cancer Center will be done in the  Main lab at [place] on 1st floor. If you have a lab appointment with the Cancer Center please come in thru the  Main Entrance and check in at the main information desk   Today you received the following chemotherapy agents Velcade injection. Follow-up as scheduled. Call clinic for any questions or concerns  To help prevent nausea and vomiting after your treatment, we encourage you to take your nausea medication   If you develop nausea and vomiting, or diarrhea that is not controlled by your medication, call the clinic.  The clinic phone number is [phone]. Office hours are Monday-Friday 8:30am-5:00pm.  BELOW ARE SYMPTOMS THAT SHOULD BE REPORTED IMMEDIATELY:  *FEVER GREATER THAN 101.0 F  *CHILLS WITH OR WITHOUT FEVER  NAUSEA AND VOMITING THAT IS NOT CONTROLLED WITH YOUR NAUSEA MEDICATION  *UNUSUAL SHORTNESS OF BREATH  *UNUSUAL BRUISING OR BLEEDING  TENDERNESS IN MOUTH AND THROAT WITH OR WITHOUT PRESENCE OF ULCERS  *URINARY PROBLEMS  *BOWEL PROBLEMS  UNUSUAL RASH Items with * indicate a potential emergency and should be followed up as soon as possible. If you have an emergency after office hours please contact your primary care physician or go to the nearest emergency department.  Please call the clinic during office hours if you have any questions or concerns.   You may also contact the Patient Navigator at [phone] should you have any questions or need assistance in obtaining follow up care.      Resources For Cancer Patients and their Caregivers ? American Cancer Society: Can assist with transportation, wigs, general needs, runs Look Good Feel Better.        1-888-227-6333 ? Cancer Care: Provides financial assistance, online support groups, medication/co-pay assistance.   1-800-813-HOPE (4673) ? [facility] Assists [place] cancer patients and their families through emotional , educational and financial support.  336-427-4357 ? [place] DSS Where to apply for food stamps, Medicaid and utility assistance. 336-342-1394 ? RC[facility]: Transportation to medical appointments. 336-347-2287 ? Social Security Administration: May apply for disability if have a Stage IV cancer. 336-342-7796 1-800-772-1213 ? [place] Aging, Disability and Transit Services: Assists with nutrition, care and transit needs. 336-349-2343         

## 2018-07-14 MED ORDER — HEPARIN SOD (PORK) LOCK FLUSH 100 UNIT/ML IV SOLN
INTRAVENOUS | Status: AC
  Filled 2018-07-14: qty 5

## 2018-07-14 MED ORDER — CYANOCOBALAMIN 1000 MCG/ML IJ SOLN
INTRAMUSCULAR | Status: AC
  Filled 2018-07-14: qty 1

## 2018-07-27 ENCOUNTER — Inpatient Hospital Stay (HOSPITAL_COMMUNITY): Payer: Medicare PPO

## 2018-07-27 ENCOUNTER — Encounter (HOSPITAL_COMMUNITY): Payer: Self-pay

## 2018-07-27 ENCOUNTER — Ambulatory Visit (HOSPITAL_COMMUNITY): Payer: Medicare PPO | Admitting: Internal Medicine

## 2018-07-27 ENCOUNTER — Inpatient Hospital Stay (HOSPITAL_COMMUNITY): Payer: Medicare PPO | Admitting: Hematology

## 2018-07-27 ENCOUNTER — Inpatient Hospital Stay (HOSPITAL_COMMUNITY): Payer: Medicare PPO | Attending: Hematology

## 2018-07-27 ENCOUNTER — Ambulatory Visit (HOSPITAL_COMMUNITY): Payer: Medicare PPO

## 2018-07-27 ENCOUNTER — Other Ambulatory Visit (HOSPITAL_COMMUNITY): Payer: Medicare PPO

## 2018-07-27 VITALS — BP 106/65 | HR 72 | Temp 98.3°F | Resp 18 | Wt 135.1 lb

## 2018-07-27 DIAGNOSIS — Z5112 Encounter for antineoplastic immunotherapy: Secondary | ICD-10-CM | POA: Diagnosis not present

## 2018-07-27 DIAGNOSIS — C9000 Multiple myeloma not having achieved remission: Secondary | ICD-10-CM

## 2018-07-27 LAB — COMPREHENSIVE METABOLIC PANEL
ALK PHOS: 41 U/L (ref 38–126)
ALT: 14 U/L (ref 0–44)
ANION GAP: 4 — AB (ref 5–15)
AST: 27 U/L (ref 15–41)
Albumin: 3.1 g/dL — ABNORMAL LOW (ref 3.5–5.0)
BILIRUBIN TOTAL: 0.9 mg/dL (ref 0.3–1.2)
BUN: 22 mg/dL (ref 8–23)
CALCIUM: 9.3 mg/dL (ref 8.9–10.3)
CO2: 29 mmol/L (ref 22–32)
CREATININE: 1.23 mg/dL — AB (ref 0.44–1.00)
Chloride: 107 mmol/L (ref 98–111)
GFR calc Af Amer: 49 mL/min — ABNORMAL LOW (ref >60)
GFR calc non Af Amer: 42 mL/min — ABNORMAL LOW (ref >60)
Glucose, Bld: 83 mg/dL (ref 70–99)
Potassium: 3.9 mmol/L (ref 3.5–5.1)
SODIUM: 140 mmol/L (ref 135–145)
TOTAL PROTEIN: 8 g/dL (ref 6.5–8.1)

## 2018-07-27 LAB — CBC WITH DIFFERENTIAL/PLATELET
Basophils Absolute: 0 10*3/uL (ref 0.0–0.1)
Basophils Relative: 0 %
EOS ABS: 0.1 10*3/uL (ref 0.0–0.7)
Eosinophils Relative: 2 %
HEMATOCRIT: 30.5 % — AB (ref 36.0–46.0)
HEMOGLOBIN: 9.8 g/dL — AB (ref 12.0–15.0)
LYMPHS ABS: 0.8 10*3/uL (ref 0.7–4.0)
LYMPHS PCT: 15 %
MCH: 30.2 pg (ref 26.0–34.0)
MCHC: 32.1 g/dL (ref 30.0–36.0)
MCV: 93.8 fL (ref 78.0–100.0)
Monocytes Absolute: 0.5 10*3/uL (ref 0.1–1.0)
Monocytes Relative: 10 %
NEUTROS ABS: 3.9 10*3/uL (ref 1.7–7.7)
NEUTROS PCT: 73 %
Platelets: 147 10*3/uL — ABNORMAL LOW (ref 150–400)
RBC: 3.25 MIL/uL — AB (ref 3.87–5.11)
RDW: 17.9 % — ABNORMAL HIGH (ref 11.5–15.5)
WBC: 5.3 10*3/uL (ref 4.0–10.5)

## 2018-07-27 MED ORDER — PROCHLORPERAZINE MALEATE 10 MG PO TABS: 10.0000 mg | ORAL_TABLET | Freq: Once | ORAL | Status: DC

## 2018-07-27 MED ORDER — BORTEZOMIB CHEMO SQ INJECTION 3.5 MG (2.5MG/ML)
1.0000 mg/m2 | Freq: Once | INTRAMUSCULAR | Status: AC
  Administered 2018-07-27: 1.75 mg via SUBCUTANEOUS
  Filled 2018-07-27: qty 0.7

## 2018-07-27 MED ORDER — DEXAMETHASONE 4 MG PO TABS
10.0000 mg | ORAL_TABLET | Freq: Once | ORAL | Status: AC
Start: 2018-07-27 — End: 2018-07-27
  Administered 2018-07-27: 10 mg via ORAL
  Filled 2018-07-27: qty 2.5

## 2018-07-27 NOTE — Patient Instructions (Signed)
[facility] Discharge Instructions for Patients Receiving Chemotherapy   Beginning January 23rd 2017 lab work for the Cancer Center will be done in the  Main lab at [place] on 1st floor. If you have a lab appointment with the Cancer Center please come in thru the  Main Entrance and check in at the main information desk   Today you received the following chemotherapy agents Velcade injection. Follow-up as scheduled. Call clinic for any questions or concerns  To help prevent nausea and vomiting after your treatment, we encourage you to take your nausea medication   If you develop nausea and vomiting, or diarrhea that is not controlled by your medication, call the clinic.  The clinic phone number is [phone]. Office hours are Monday-Friday 8:30am-5:00pm.  BELOW ARE SYMPTOMS THAT SHOULD BE REPORTED IMMEDIATELY:  *FEVER GREATER THAN 101.0 F  *CHILLS WITH OR WITHOUT FEVER  NAUSEA AND VOMITING THAT IS NOT CONTROLLED WITH YOUR NAUSEA MEDICATION  *UNUSUAL SHORTNESS OF BREATH  *UNUSUAL BRUISING OR BLEEDING  TENDERNESS IN MOUTH AND THROAT WITH OR WITHOUT PRESENCE OF ULCERS  *URINARY PROBLEMS  *BOWEL PROBLEMS  UNUSUAL RASH Items with * indicate a potential emergency and should be followed up as soon as possible. If you have an emergency after office hours please contact your primary care physician or go to the nearest emergency department.  Please call the clinic during office hours if you have any questions or concerns.   You may also contact the Patient Navigator at [phone] should you have any questions or need assistance in obtaining follow up care.      Resources For Cancer Patients and their Caregivers ? American Cancer Society: Can assist with transportation, wigs, general needs, runs Look Good Feel Better.        1-888-227-6333 ? Cancer Care: Provides financial assistance, online support groups, medication/co-pay assistance.   1-800-813-HOPE (4673) ? [facility] Assists [place] cancer patients and their families through emotional , educational and financial support.  336-427-4357 ? [place] DSS Where to apply for food stamps, Medicaid and utility assistance. 336-342-1394 ? [facility]: Transportation to medical appointments. 336-347-2287 ? Social Security Administration: May apply for disability if have a Stage IV cancer. 336-342-7796 1-800-772-1213 ? [place] [facility]ging, Disability and Transit Services: Assists with nutrition, care and transit needs. 336-349-2343         

## 2018-07-27 NOTE — Progress Notes (Signed)
Amanda Wu reviewed with Dr. Delton Coombes today and pt approved for Velcade injection per MD. Pt will see MD next week after further lab results obtained instead of this week per MD. Pt and sister informed of this and verbalized understanding                              Amanda Wu tolerated Velcade injection well without complaints or incident. VSS Pt discharged self ambulatory in satisfactory condition accompanied by her sister

## 2018-07-28 LAB — KAPPA/LAMBDA LIGHT CHAINS
Kappa free light chain: 33 mg/L — ABNORMAL HIGH (ref 3.3–19.4)
Kappa, lambda light chain ratio: 0.25 — ABNORMAL LOW (ref 0.26–1.65)
LAMDA FREE LIGHT CHAINS: 130.2 mg/L — AB (ref 5.7–26.3)

## 2018-07-28 LAB — PROTEIN ELECTROPHORESIS, SERUM
A/G Ratio: 0.7 (ref 0.7–1.7)
ALPHA-2-GLOBULIN: 0.8 g/dL (ref 0.4–1.0)
Albumin ELP: 3.2 g/dL (ref 2.9–4.4)
Alpha-1-Globulin: 0.2 g/dL (ref 0.0–0.4)
BETA GLOBULIN: 0.8 g/dL (ref 0.7–1.3)
Gamma Globulin: 2.5 g/dL — ABNORMAL HIGH (ref 0.4–1.8)
Globulin, Total: 4.3 g/dL — ABNORMAL HIGH (ref 2.2–3.9)
M-Spike, %: 1.9 g/dL — ABNORMAL HIGH
Total Protein ELP: 7.5 g/dL (ref 6.0–8.5)

## 2018-08-03 ENCOUNTER — Inpatient Hospital Stay (HOSPITAL_COMMUNITY): Payer: Medicare PPO

## 2018-08-03 ENCOUNTER — Other Ambulatory Visit (HOSPITAL_COMMUNITY): Payer: Medicare PPO

## 2018-08-03 ENCOUNTER — Inpatient Hospital Stay (HOSPITAL_BASED_OUTPATIENT_CLINIC_OR_DEPARTMENT_OTHER): Payer: Medicare PPO | Admitting: Hematology

## 2018-08-03 ENCOUNTER — Encounter (HOSPITAL_COMMUNITY): Payer: Self-pay | Admitting: Hematology

## 2018-08-03 ENCOUNTER — Encounter (HOSPITAL_COMMUNITY): Payer: Self-pay

## 2018-08-03 ENCOUNTER — Ambulatory Visit (HOSPITAL_COMMUNITY): Payer: Medicare PPO

## 2018-08-03 VITALS — BP 118/65 | HR 71 | Temp 98.4°F | Resp 18 | Wt 134.4 lb

## 2018-08-03 DIAGNOSIS — Z5112 Encounter for antineoplastic immunotherapy: Secondary | ICD-10-CM | POA: Diagnosis not present

## 2018-08-03 DIAGNOSIS — C9000 Multiple myeloma not having achieved remission: Secondary | ICD-10-CM

## 2018-08-03 LAB — COMPREHENSIVE METABOLIC PANEL
ALT: 16 U/L (ref 0–44)
AST: 30 U/L (ref 15–41)
Albumin: 3.1 g/dL — ABNORMAL LOW (ref 3.5–5.0)
Alkaline Phosphatase: 42 U/L (ref 38–126)
Anion gap: 8 (ref 5–15)
BILIRUBIN TOTAL: 0.7 mg/dL (ref 0.3–1.2)
BUN: 18 mg/dL (ref 8–23)
CO2: 26 mmol/L (ref 22–32)
CREATININE: 1.22 mg/dL — AB (ref 0.44–1.00)
Calcium: 8.9 mg/dL (ref 8.9–10.3)
Chloride: 105 mmol/L (ref 98–111)
GFR calc Af Amer: 50 mL/min — ABNORMAL LOW (ref >60)
GFR, EST NON AFRICAN AMERICAN: 43 mL/min — AB (ref >60)
Glucose, Bld: 161 mg/dL — ABNORMAL HIGH (ref 70–99)
POTASSIUM: 3.4 mmol/L — AB (ref 3.5–5.1)
Sodium: 139 mmol/L (ref 135–145)
TOTAL PROTEIN: 8.1 g/dL (ref 6.5–8.1)

## 2018-08-03 LAB — CBC WITH DIFFERENTIAL/PLATELET
BASOS PCT: 0 %
Basophils Absolute: 0 10*3/uL (ref 0.0–0.1)
EOS PCT: 2 %
Eosinophils Absolute: 0.1 10*3/uL (ref 0.0–0.7)
HCT: 30.1 % — ABNORMAL LOW (ref 36.0–46.0)
HEMOGLOBIN: 9.7 g/dL — AB (ref 12.0–15.0)
Lymphocytes Relative: 15 %
Lymphs Abs: 0.7 10*3/uL (ref 0.7–4.0)
MCH: 30.1 pg (ref 26.0–34.0)
MCHC: 32.2 g/dL (ref 30.0–36.0)
MCV: 93.5 fL (ref 78.0–100.0)
MONOS PCT: 8 %
Monocytes Absolute: 0.4 10*3/uL (ref 0.1–1.0)
NEUTROS PCT: 75 %
Neutro Abs: 3.7 10*3/uL (ref 1.7–7.7)
PLATELETS: 87 10*3/uL — AB (ref 150–400)
RBC: 3.22 MIL/uL — AB (ref 3.87–5.11)
RDW: 17.8 % — ABNORMAL HIGH (ref 11.5–15.5)
WBC: 4.9 10*3/uL (ref 4.0–10.5)

## 2018-08-03 MED ORDER — DEXAMETHASONE 4 MG PO TABS
10.0000 mg | ORAL_TABLET | Freq: Once | ORAL | Status: AC
  Administered 2018-08-03: 10 mg via ORAL
  Filled 2018-08-03: qty 2.5

## 2018-08-03 MED ORDER — LENALIDOMIDE 10 MG PO CAPS: 10.0000 mg | ORAL_CAPSULE | Freq: Every day | ORAL | 1 refills | Status: DC

## 2018-08-03 MED ORDER — BORTEZOMIB CHEMO SQ INJECTION 3.5 MG (2.5MG/ML)
1.0000 mg/m2 | Freq: Once | INTRAMUSCULAR | Status: AC
  Administered 2018-08-03: 1.75 mg via SUBCUTANEOUS
  Filled 2018-08-03: qty 0.7

## 2018-08-03 MED ORDER — LIDOCAINE HCL (PF) 1 % IJ SOLN
INTRAMUSCULAR | Status: AC
  Filled 2018-08-03: qty 5

## 2018-08-03 MED ORDER — PROCHLORPERAZINE MALEATE 10 MG PO TABS: 10.0000 mg | ORAL_TABLET | Freq: Once | ORAL | Status: DC

## 2018-08-03 NOTE — Patient Instructions (Signed)
[facility] at [facility] Discharge Instructions     Thank you for choosing  Cancer Center at [facility] to provide your oncology and hematology care.  To afford each patient quality time with our provider, please arrive at least 15 minutes before your scheduled appointment time.   If you have a lab appointment with the Cancer Center please come in thru the  Main Entrance and check in at the main information desk  You need to re-schedule your appointment should you arrive 10 or more minutes late.  We strive to give you quality time with our providers, and arriving late affects you and other patients whose appointments are after yours.  Also, if you no show three or more times for appointments you may be dismissed from the clinic at the providers discretion.     Again, thank you for choosing [facility].  Our hope is that these requests will decrease the amount of time that you wait before being seen by our physicians.       _____________________________________________________________  Should you have questions after your visit to  Cancer Center, please contact our office at [phone] between the hours of 8:00 a.m. and 4:30 p.m.  Voicemails left after 4:00 p.m. will not be returned until the following business day.  For prescription refill requests, have your pharmacy contact our office and allow 72 hours.    Cancer Center Support Programs:   > Cancer Support Group  2nd Tuesday of the month 1pm-2pm, Journey Room    

## 2018-08-03 NOTE — Assessment & Plan Note (Signed)
1.  IgG lambda multiple myeloma: - Revlimid 6m and dexamethasone 3 weeks on 1 week off started in September 2009, changed to 2 weeks on 2 weeks off in November 2010, changed to 2 weeks on 3 weeks of in May 2012, Revlimid 15 mg 14/35 days and weekly Dex in January 2015, Revlimid 15 mg change to 14/42 days and weekly Dex in June 2017, dexamethasone stopped in November 2017 - Velcade and dexamethasone weekly, 3 weeks on/1 week off started recently -Dr. LMaylon Peppershas called me to treat this patient locally so that she can follow-up with him periodically.  -She received Zometa monthly starting September 2009, switch to every 3 months in 2012 and lately off of it. - She was started on Velcade 1 mg/m, 3 weeks on 1 week off on 06/01/2018.  Today is her off week.  She has tolerated it very well.  Denies any neuropathy symptoms.  Takes dexamethasone 10 mg on days of Velcade. - Cycle 3-day 1 was started on 07/27/2018.  She is tolerating Velcade very well. - I have reviewed the results of the M spike which was stable at 1.9.  This was 1.93 on 05/20/2018.  Free light chain ratio is 0.2, previously 0.25.  Lambda light chains were previously 203.  They have come down to 130. -Since her M spike is not improving, I have recommended adding low-dose Revlimid.  We talked about the side effects in detail.  Will add Revlimid 10 mg on a 2 weeks on 2 weeks off basis along with Velcade on the current schedule.  She is also taking dexamethasone 10 mg on days of Velcade.  Increasing dexamethasone will likely worsen her blood sugars.  I have sent prescription to the specialty pharmacy.  She was supposed to call uKoreawhen she gets the medication.  I will see her back in 6 weeks for follow-up.  2.  Remote history of CNS MALT like lymphoma, treated with chemotherapy and radiation, last brain MRI was in 2013, CT of the brain in 2018 with no evidence of recurrence.  She does have some dementia from it.  She will continue Aricept 10 mg daily.

## 2018-08-03 NOTE — Progress Notes (Signed)
0930 labs reviewed and patient seen by Dr. Delton Coombes who approved patient for Velcade injection today. Amanda Wu tolerated Velcade injection without incident or complaint. VSS. Discharged self ambulatory in satisfactory condition in presence of sister.

## 2018-08-03 NOTE — Patient Instructions (Signed)
[facility] at [facility]  Discharge Instructions:  Today you received your Velcade injection. Follow up as scheduled. Call clinic for any questions or concerns.  _______________________________________________________________  Thank you for choosing [facility] at [facility] to provide your oncology and hematology care.  To afford each patient quality time with our providers, please arrive at least 15 minutes before your scheduled appointment.  You need to re-schedule your appointment if you arrive 10 or more minutes late.  We strive to give you quality time with our providers, and arriving late affects you and other patients whose appointments are after yours.  Also, if you no show three or more times for appointments you may be dismissed from the clinic.  Again, thank you for choosing [facility]. Our hope is that these requests will allow you access to exceptional care and in a timely manner. _______________________________________________________________  If you have questions after your visit, please contact our office at [phone] between the hours of 8:30 a.m. and 5:00 p.m. Voicemails left after 4:30 p.m. will not be returned until the following business day. _______________________________________________________________  For prescription refill requests, have your pharmacy contact our office. _______________________________________________________________  Recommendations made by the consultant and any test results will be sent to your referring physician. _______________________________________________________________ 

## 2018-08-03 NOTE — Progress Notes (Signed)
[place], [place] [address]   CLINIC:  Medical Oncology/Hematology  PCP:  Monico Blitz, MD [place] [id number] [phone]   REASON FOR VISIT:  Follow-up for multiple myeloma  CURRENT THERAPY: velcade and dexamethasone 3 weeks on 1 week off  BRIEF ONCOLOGIC HISTORY:    Multiple myeloma ([place])   07/09/2017 Initial Diagnosis    Multiple myeloma ([place])    05/31/2018 -  Chemotherapy    The patient had bortezomib SQ (VELCADE) chemo injection 1.75 mg, 1 mg/m2 = 1.75 mg (100 % of original dose 1 mg/m2), Subcutaneous,  Once, 3 of 6 cycles Dose modification: 1 mg/m2 (original dose 1 mg/m2, Cycle 1, Reason: Provider Judgment) Administration: 1.75 mg (06/01/2018), 1.75 mg (06/08/2018), 1.75 mg (06/15/2018), 1.75 mg (06/29/2018), 1.75 mg (07/06/2018), 1.75 mg (07/13/2018), 1.75 mg (07/27/2018), 1.75 mg (08/03/2018)  for chemotherapy treatment.       INTERVAL HISTORY:  Amanda Wu 72 y.o. female returns for routine follow-up for multiple myeloma and consideration for next cycle of chemotherapy. Patient is here today with her sister. She is doing well with treatment. She has fatigue throughout the day. She has no other complaints at this time. She is taking her dexamethasone as prescribed with no issues. Patient reports her appetite is 100% and she drinks an ensure daily to help maintain her weight. Her energy is at 75%. Patient denies nausea, vomiting, or diarrhea. Denies headaches. Denies any new pain.     REVIEW OF SYSTEMS:  Review of Systems  Constitutional: Positive for fatigue.  All other systems reviewed and are negative.    PAST MEDICAL/SURGICAL HISTORY:  Past Medical History:  Diagnosis Date  . Benign hypertension   . Cataract   . 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 Diabetes mellitus without complication ([place])   . H/O partial nephrectomy   . Hypokalemia   . Hypothyroidism   . Impaired cognition   . Multiple myeloma ([facility])      Dr Maylon Peppers, [facility]  . Thyroid disease    Past Surgical History:  Procedure Laterality Date  . ABDOMINAL HYSTERECTOMY    . CRANIOTOMY     for lymphoma  . YAG LASER APPLICATION Right 5/80/9983   Procedure: YAG LASER APPLICATION;  Surgeon: [name], MD;  Location: [facility];  Service: Ophthalmology;  Laterality: Right;     SOCIAL HISTORY:  Social History   Socioeconomic History  . Marital status: Single    Spouse name: Not on file  . Number of children: Not on file  . Years of education: Not on file  . Highest education level: Not on file  Occupational History  . Not on file  Social Needs  . Financial resource strain: Not on file  . Food insecurity:    Worry: Not on file    Inability: Not on file  . Transportation needs:    Medical: Not on file    Non-medical: Not on file  Tobacco Use  . Smoking status: Never Smoker  . Smokeless tobacco: Never Used  Substance and Sexual Activity  . Alcohol use: No  . Drug use: No  . Sexual activity: Not on file  Lifestyle  . Physical activity:    Days per week: Not on file    Minutes per session: Not on file  . Stress: Not on file  Relationships  . Social connections:    Talks on phone: Not on file    Gets together: Not on  file    Attends religious service: Not on file    Active member of club or organization: Not on file    Attends meetings of clubs or organizations: Not on file    Relationship status: Not on file  . Intimate partner violence:    Fear of current or ex partner: Not on file    Emotionally abused: Not on file    Physically abused: Not on file    Forced sexual activity: Not on file  Other Topics Concern  . Not on file  Social History Narrative  . Not on file    FAMILY HISTORY:  Family History  Problem Relation Age of Onset  . Depression Mother   . Diabetes Mother   . Heart disease Father   . Cancer - Prostate Brother   . Cancer Brother   . Cancer Sister     CURRENT MEDICATIONS:  Outpatient  Encounter Medications as of 08/03/2018  Medication Sig Note  . acetaminophen (TYLENOL) 500 MG tablet Take 1,000 mg by mouth every 6 (six) hours as needed for mild pain.    Marland Kitchen acyclovir (ZOVIRAX) 400 MG tablet Take 400 mg by mouth 2 (two) times daily.   Marland Kitchen amLODipine (NORVASC) 5 MG tablet Take 5 mg by mouth daily.    . bortezomib IV (VELCADE) 3.5 MG injection Inject into the vein.   . calcium-vitamin D (OSCAL WITH D) 500-200 MG-UNIT tablet Take 1 tablet by mouth daily with breakfast.   . dexamethasone (DECADRON) 4 MG tablet TAKE TWO AND ONE-HALF TABLETS BY MOUTH WEEKLY FOR THREE WEEKS (THREE WEEKS ON, ONE WEEK OFF) IN A ROW AND REPEAT MONTHLY 06/22/2018: Patient states that she is given the dexamethasone while here getting her velcade injection  . donepezil (ARICEPT) 10 MG tablet Take 10 mg by mouth every evening.   . ferrous sulfate 325 (65 FE) MG tablet Take 325 mg by mouth daily with breakfast.   . lenalidomide (REVLIMID) 10 MG capsule Take 1 capsule (10 mg total) by mouth daily for 14 days.   Marland Kitchen levothyroxine (SYNTHROID, LEVOTHROID) 25 MCG tablet Take 25 mcg by mouth daily before breakfast.   . losartan (COZAAR) 100 MG tablet Take 100 mg by mouth daily.   . metFORMIN (GLUCOPHAGE) 500 MG tablet Take 250 mg by mouth every other day.   . Multiple Vitamins-Minerals (MULTIVITAMIN WOMEN 50+ PO) Take by mouth.   . potassium chloride (K-DUR,KLOR-CON) 10 MEQ tablet Take 30 mEq by mouth daily.   . [EXPIRED] bortezomib SQ (VELCADE) chemo injection 1.75 mg    . [EXPIRED] dexamethasone (DECADRON) tablet 10 mg    . [DISCONTINUED] prochlorperazine (COMPAZINE) tablet 10 mg     No facility-administered encounter medications on file as of 08/03/2018.     ALLERGIES:  Allergies  Allergen Reactions  . Lotensin [Benazepril Hcl]     Facial swelling (Angioedema) Note: ARBs should also be contraindicated, but she is on Losartan!     PHYSICAL EXAM:  ECOG Performance status: 1  Vitals:   08/03/18 0835  BP:  118/65  Pulse: 71  Resp: 18  Temp: 98.4 F (36.9 C)  SpO2: 100%   Filed Weights   08/03/18 0835  Weight: 134 lb 6.4 oz (61 kg)    Physical Exam  Constitutional: She is oriented to person, place, and time. She appears well-developed and well-nourished.  Cardiovascular: Normal rate, regular rhythm and normal heart sounds.  Pulmonary/Chest: Effort normal and breath sounds normal.  Neurological: She is alert and oriented to person,  place, and time.  Skin: Skin is warm and dry.     LABORATORY DATA:  I have reviewed the labs as listed.  CBC    Component Value Date/Time   WBC 4.9 08/03/2018 0756   RBC 3.22 (L) 08/03/2018 0756   HGB 9.7 (L) 08/03/2018 0756   HCT 30.1 (L) 08/03/2018 0756   PLT 87 (L) 08/03/2018 0756   MCV 93.5 08/03/2018 0756   MCH 30.1 08/03/2018 0756   MCHC 32.2 08/03/2018 0756   RDW 17.8 (H) 08/03/2018 0756   LYMPHSABS 0.7 08/03/2018 0756   MONOABS 0.4 08/03/2018 0756   EOSABS 0.1 08/03/2018 0756   BASOSABS 0.0 08/03/2018 0756   CMP Latest Ref Rng & Units 08/03/2018 07/27/2018 07/13/2018  Glucose 70 - 99 mg/dL 161(H) 83 128(H)  BUN 8 - 23 mg/dL '18 22 21  ' Creatinine 0.44 - 1.00 mg/dL 1.22(H) 1.23(H) 1.26(H)  Sodium 135 - 145 mmol/L 139 140 142  Potassium 3.5 - 5.1 mmol/L 3.4(L) 3.9 3.9  Chloride 98 - 111 mmol/L 105 107 107  CO2 22 - 32 mmol/L '26 29 27  ' Calcium 8.9 - 10.3 mg/dL 8.9 9.3 9.3  Total Protein 6.5 - 8.1 g/dL 8.1 8.0 8.0  Total Bilirubin 0.3 - 1.2 mg/dL 0.7 0.9 0.3  Alkaline Phos 38 - 126 U/L 42 41 38  AST 15 - 41 U/L '30 27 29  ' ALT 0 - 44 U/L '16 14 16       ' ASSESSMENT & PLAN:   Multiple myeloma (H[facility]) 1.  IgG lambda multiple myeloma: - Revlimid 34m and dexamethasone 3 weeks on 1 week off started in September 2009, changed to 2 weeks on 2 weeks off in November 2010, changed to 2 weeks on 3 weeks of in May 2012, Revlimid 15 mg 14/35 days and weekly Dex in January 2015, Revlimid 15 mg change to 14/42 days and weekly Dex in June 2017,  dexamethasone stopped in November 2017 - Velcade and dexamethasone weekly, 3 weeks on/1 week off started recently -Dr. LMaylon Peppershas called me to treat this patient locally so that she can follow-up with him periodically.  -She received Zometa monthly starting September 2009, switch to every 3 months in 2012 and lately off of it. - She was started on Velcade 1 mg/m, 3 weeks on 1 week off on 06/01/2018.  Today is her off week.  She has tolerated it very well.  Denies any neuropathy symptoms.  Takes dexamethasone 10 mg on days of Velcade. - Cycle 3-day 1 was started on 07/27/2018.  She is tolerating Velcade very well. - I have reviewed the results of the M spike which was stable at 1.9.  This was 1.93 on 05/20/2018.  Free light chain ratio is 0.2, previously 0.25.  Lambda light chains were previously 203.  They have come down to 130. -Since her M spike is not improving, I have recommended adding low-dose Revlimid.  We talked about the side effects in detail.  Will add Revlimid 10 mg on a 2 weeks on 2 weeks off basis along with Velcade on the current schedule.  She is also taking dexamethasone 10 mg on days of Velcade.  Increasing dexamethasone will likely worsen her blood sugars.  I have sent prescription to the specialty pharmacy.  She was supposed to call uKoreawhen she gets the medication.  I will see her back in 6 weeks for follow-up.  2.  Remote history of CNS MALT like lymphoma, treated with chemotherapy and radiation, last brain MRI  was in 2013, CT of the brain in 2018 with no evidence of recurrence.  She does have some dementia from it.  She will continue Aricept 10 mg daily.      Orders placed this encounter:  Orders Placed This Encounter  Procedures  . Protein electrophoresis, serum  . Kappa/lambda light chains  . CBC with Differential/Platelet  . Comprehensive metabolic panel  . Lactate dehydrogenase  . CBC with Differential/Platelet  . Comprehensive metabolic panel      Derek Jack, MD [place] [phone]

## 2018-08-04 ENCOUNTER — Other Ambulatory Visit (HOSPITAL_COMMUNITY): Payer: Self-pay | Admitting: *Deleted

## 2018-08-04 DIAGNOSIS — C9000 Multiple myeloma not having achieved remission: Secondary | ICD-10-CM

## 2018-08-04 MED ORDER — LENALIDOMIDE 10 MG PO CAPS: ORAL_CAPSULE | ORAL | 1 refills | Status: DC

## 2018-08-10 ENCOUNTER — Encounter (HOSPITAL_COMMUNITY): Payer: Self-pay

## 2018-08-10 ENCOUNTER — Other Ambulatory Visit: Payer: Self-pay

## 2018-08-10 ENCOUNTER — Inpatient Hospital Stay (HOSPITAL_COMMUNITY): Payer: Medicare PPO

## 2018-08-10 ENCOUNTER — Telehealth (HOSPITAL_COMMUNITY): Payer: Self-pay | Admitting: Hematology

## 2018-08-10 VITALS — BP 124/72 | HR 59 | Temp 98.4°F | Resp 18 | Wt 135.4 lb

## 2018-08-10 DIAGNOSIS — C9000 Multiple myeloma not having achieved remission: Secondary | ICD-10-CM

## 2018-08-10 DIAGNOSIS — Z5112 Encounter for antineoplastic immunotherapy: Secondary | ICD-10-CM | POA: Diagnosis not present

## 2018-08-10 LAB — COMPREHENSIVE METABOLIC PANEL
ALT: 14 U/L (ref 0–44)
AST: 28 U/L (ref 15–41)
Albumin: 3.3 g/dL — ABNORMAL LOW (ref 3.5–5.0)
Alkaline Phosphatase: 42 U/L (ref 38–126)
Anion gap: 5 (ref 5–15)
BILIRUBIN TOTAL: 0.4 mg/dL (ref 0.3–1.2)
BUN: 22 mg/dL (ref 8–23)
CO2: 29 mmol/L (ref 22–32)
CREATININE: 1.52 mg/dL — AB (ref 0.44–1.00)
Calcium: 9.6 mg/dL (ref 8.9–10.3)
Chloride: 107 mmol/L (ref 98–111)
GFR calc Af Amer: 38 mL/min — ABNORMAL LOW (ref >60)
GFR, EST NON AFRICAN AMERICAN: 33 mL/min — AB (ref >60)
Glucose, Bld: 77 mg/dL (ref 70–99)
Potassium: 4.7 mmol/L (ref 3.5–5.1)
Sodium: 141 mmol/L (ref 135–145)
TOTAL PROTEIN: 8.5 g/dL — AB (ref 6.5–8.1)

## 2018-08-10 LAB — CBC WITH DIFFERENTIAL/PLATELET
BASOS ABS: 0 10*3/uL (ref 0.0–0.1)
Basophils Relative: 0 %
Eosinophils Absolute: 0.1 10*3/uL (ref 0.0–0.7)
Eosinophils Relative: 2 %
HEMATOCRIT: 42.9 % (ref 36.0–46.0)
Hemoglobin: 14.7 g/dL (ref 12.0–15.0)
LYMPHS ABS: 0.7 10*3/uL (ref 0.7–4.0)
LYMPHS PCT: 16 %
MCH: 32 pg (ref 26.0–34.0)
MCHC: 34.3 g/dL (ref 30.0–36.0)
MCV: 93.5 fL (ref 78.0–100.0)
MONO ABS: 0.4 10*3/uL (ref 0.1–1.0)
Monocytes Relative: 9 %
NEUTROS ABS: 3.1 10*3/uL (ref 1.7–7.7)
Neutrophils Relative %: 73 %
Platelets: 96 10*3/uL — ABNORMAL LOW (ref 150–400)
RBC: 4.59 MIL/uL (ref 3.87–5.11)
RDW: 18 % — AB (ref 11.5–15.5)
WBC: 4.3 10*3/uL (ref 4.0–10.5)

## 2018-08-10 MED ORDER — BORTEZOMIB CHEMO SQ INJECTION 3.5 MG (2.5MG/ML)
1.0000 mg/m2 | Freq: Once | INTRAMUSCULAR | Status: AC
  Administered 2018-08-10: 1.75 mg via SUBCUTANEOUS
  Filled 2018-08-10: qty 0.7

## 2018-08-10 MED ORDER — PROCHLORPERAZINE MALEATE 10 MG PO TABS: 10.0000 mg | ORAL_TABLET | Freq: Once | ORAL | Status: DC

## 2018-08-10 MED ORDER — DEXAMETHASONE 4 MG PO TABS
10.0000 mg | ORAL_TABLET | Freq: Once | ORAL | Status: AC
  Administered 2018-08-10: 10 mg via ORAL
  Filled 2018-08-10: qty 2.5

## 2018-08-10 NOTE — Progress Notes (Signed)
Lab results (platelets, creatinine) reviewed with Dr. Delton Coombes.  Okay to tx today per MD.   [facility] Vancleve presents today for injection per the provider's orders.  Velcade administration without incident; see MAR for injection details.  Patient tolerated procedure well and without incident.  No questions or complaints noted at this time.  Discharged ambulatory in c/o family.

## 2018-08-10 NOTE — Telephone Encounter (Signed)
Revlimid will be delivered on 08/13/18 per csr/diplomat

## 2018-08-13 ENCOUNTER — Telehealth (HOSPITAL_COMMUNITY): Payer: Self-pay | Admitting: Hematology

## 2018-08-17 ENCOUNTER — Encounter (HOSPITAL_COMMUNITY): Payer: Self-pay | Admitting: *Deleted

## 2018-08-17 NOTE — Progress Notes (Signed)
I spoke with patient's caregiver and she reported that they did receive the Revlimid on Friday. She started taking the revlimid on Saturday morning.  I advised her that she will take it for 14 days on and then take a break for 14 days.  She verbalizes understanding.  I told her that I have created a calendar with her treatments and when to take each medication. It will be provided to her at their next office visit. She verbalizes appreciation.

## 2018-08-24 ENCOUNTER — Other Ambulatory Visit (HOSPITAL_COMMUNITY): Payer: Medicare PPO

## 2018-08-24 ENCOUNTER — Encounter (HOSPITAL_COMMUNITY): Payer: Self-pay | Admitting: Internal Medicine

## 2018-08-24 ENCOUNTER — Inpatient Hospital Stay (HOSPITAL_COMMUNITY): Payer: Medicare PPO

## 2018-08-24 ENCOUNTER — Other Ambulatory Visit: Payer: Self-pay

## 2018-08-24 ENCOUNTER — Inpatient Hospital Stay (HOSPITAL_COMMUNITY): Payer: Medicare PPO | Admitting: Internal Medicine

## 2018-08-24 ENCOUNTER — Inpatient Hospital Stay (HOSPITAL_COMMUNITY): Payer: Medicare PPO | Attending: Internal Medicine

## 2018-08-24 VITALS — BP 116/61 | HR 85 | Temp 98.5°F | Resp 18 | Wt 136.4 lb

## 2018-08-24 DIAGNOSIS — E119 Type 2 diabetes mellitus without complications: Secondary | ICD-10-CM | POA: Diagnosis not present

## 2018-08-24 DIAGNOSIS — Z5112 Encounter for antineoplastic immunotherapy: Secondary | ICD-10-CM | POA: Insufficient documentation

## 2018-08-24 DIAGNOSIS — I10 Essential (primary) hypertension: Secondary | ICD-10-CM | POA: Insufficient documentation

## 2018-08-24 DIAGNOSIS — C9000 Multiple myeloma not having achieved remission: Secondary | ICD-10-CM | POA: Diagnosis not present

## 2018-08-24 LAB — CBC WITH DIFFERENTIAL/PLATELET
BASOS ABS: 0 10*3/uL (ref 0.0–0.1)
BASOS PCT: 0 %
Eosinophils Absolute: 0.2 10*3/uL (ref 0.0–0.7)
Eosinophils Relative: 3 %
HEMATOCRIT: 28.8 % — AB (ref 36.0–46.0)
HEMOGLOBIN: 9.3 g/dL — AB (ref 12.0–15.0)
LYMPHS PCT: 13 %
Lymphs Abs: 0.8 10*3/uL (ref 0.7–4.0)
MCH: 30.4 pg (ref 26.0–34.0)
MCHC: 32.3 g/dL (ref 30.0–36.0)
MCV: 94.1 fL (ref 78.0–100.0)
MONOS PCT: 6 %
Monocytes Absolute: 0.4 10*3/uL (ref 0.1–1.0)
NEUTROS ABS: 4.6 10*3/uL (ref 1.7–7.7)
NEUTROS PCT: 78 %
Platelets: 147 10*3/uL — ABNORMAL LOW (ref 150–400)
RBC: 3.06 MIL/uL — ABNORMAL LOW (ref 3.87–5.11)
RDW: 17.8 % — ABNORMAL HIGH (ref 11.5–15.5)
WBC: 5.9 10*3/uL (ref 4.0–10.5)

## 2018-08-24 LAB — COMPREHENSIVE METABOLIC PANEL
ALBUMIN: 2.9 g/dL — AB (ref 3.5–5.0)
ALK PHOS: 42 U/L (ref 38–126)
ALT: 20 U/L (ref 0–44)
ANION GAP: 7 (ref 5–15)
AST: 35 U/L (ref 15–41)
BUN: 21 mg/dL (ref 8–23)
CALCIUM: 8.6 mg/dL — AB (ref 8.9–10.3)
CO2: 25 mmol/L (ref 22–32)
CREATININE: 1.31 mg/dL — AB (ref 0.44–1.00)
Chloride: 107 mmol/L (ref 98–111)
GFR calc Af Amer: 46 mL/min — ABNORMAL LOW (ref >60)
GFR calc non Af Amer: 39 mL/min — ABNORMAL LOW (ref >60)
GLUCOSE: 164 mg/dL — AB (ref 70–99)
Potassium: 3.7 mmol/L (ref 3.5–5.1)
Sodium: 139 mmol/L (ref 135–145)
Total Bilirubin: 0.3 mg/dL (ref 0.3–1.2)
Total Protein: 7.9 g/dL (ref 6.5–8.1)

## 2018-08-24 MED ORDER — HEPARIN SOD (PORK) LOCK FLUSH 100 UNIT/ML IV SOLN
INTRAVENOUS | Status: AC
  Filled 2018-08-24: qty 5

## 2018-08-24 MED ORDER — BORTEZOMIB CHEMO SQ INJECTION 3.5 MG (2.5MG/ML)
1.0000 mg/m2 | Freq: Once | INTRAMUSCULAR | Status: AC
  Administered 2018-08-24: 1.75 mg via SUBCUTANEOUS
  Filled 2018-08-24: qty 0.7

## 2018-08-24 MED ORDER — DEXAMETHASONE 4 MG PO TABS
10.0000 mg | ORAL_TABLET | Freq: Once | ORAL | Status: AC
  Administered 2018-08-24: 10 mg via ORAL
  Filled 2018-08-24: qty 2.5

## 2018-08-24 MED ORDER — PROCHLORPERAZINE MALEATE 10 MG PO TABS: 10.0000 mg | ORAL_TABLET | Freq: Once | ORAL | Status: DC

## 2018-08-24 NOTE — Progress Notes (Signed)
Pt denies any missed doses of Revlimid. Pt given up to date calendar for revlimid and velcade schedule. Pt states that she has noticed a rash on her hands that started last week, she states that she uses hydrocortisone cream and that helps the areas.

## 2018-08-24 NOTE — Progress Notes (Signed)
1030 Labs reviewed with and pt seen by Dr. Walden Field and pt approved for Velcade injection today per MD                                                                      Amanda Wu tolerated Velcade injection well without complaints or incident.Pt continues to take her Revlimid as prescribed without issues  Pt discharged self ambulatory in satisfactory condition accompanied by family member

## 2018-08-24 NOTE — Patient Instructions (Signed)
[facility] Discharge Instructions for Patients Receiving Chemotherapy   Beginning January 23rd 2017 lab work for the Cancer Center will be done in the  Main lab at [facility] on 1st floor. If you have a lab appointment with the Cancer Center please come in thru the  Main Entrance and check in at the main information desk   Today you received the following chemotherapy agents Velcade injection. Follow-up as scheduled. Call clinic for any questions or concerns  To help prevent nausea and vomiting after your treatment, we encourage you to take your nausea medication   If you develop nausea and vomiting, or diarrhea that is not controlled by your medication, call the clinic.  The clinic phone number is [phone]. Office hours are Monday-Friday 8:30am-5:00pm.  BELOW ARE SYMPTOMS THAT SHOULD BE REPORTED IMMEDIATELY:  *FEVER GREATER THAN 101.0 F  *CHILLS WITH OR WITHOUT FEVER  NAUSEA AND VOMITING THAT IS NOT CONTROLLED WITH YOUR NAUSEA MEDICATION  *UNUSUAL SHORTNESS OF BREATH  *UNUSUAL BRUISING OR BLEEDING  TENDERNESS IN MOUTH AND THROAT WITH OR WITHOUT PRESENCE OF ULCERS  *URINARY PROBLEMS  *BOWEL PROBLEMS  UNUSUAL RASH Items with * indicate a potential emergency and should be followed up as soon as possible. If you have an emergency after office hours please contact your primary care physician or go to the nearest emergency department.  Please call the clinic during office hours if you have any questions or concerns.   You may also contact the Patient Navigator at [phone] should you have any questions or need assistance in obtaining follow up care.      Resources For Cancer Patients and their Caregivers ? American Cancer Society: Can assist with transportation, wigs, general needs, runs Look Good Feel Better.        1-888-227-6333 ? Cancer Care: Provides financial assistance, online support groups, medication/co-pay assistance.   1-800-813-HOPE (4673) ? [facility] Assists [place] cancer patients and their families through emotional , educational and financial support.  336-427-4357 ? [place] DSS Where to apply for food stamps, Medicaid and utility assistance. 336-342-1394 ? RCATS: Transportation to medical appointments. 336-347-2287 ? Social Security Administration: May apply for disability if have a Stage IV cancer. 336-342-7796 1-800-772-1213 ? [place] Aging, Disability and Transit Services: Assists with nutrition, care and transit needs. [phone]         

## 2018-08-24 NOTE — Progress Notes (Signed)
Diagnosis No diagnosis found.  Staging Cancer Staging No matching staging information was found for the patient.  Assessment and Plan:  Multiple myeloma (HCC) 1.  IgG lambda multiple myeloma: - Revlimid 74m and dexamethasone 3 weeks on 1 week off started in September 2009, changed to 2 weeks on 2 weeks off in November 2010, changed to 2 weeks on 3 weeks of in May 2012, Revlimid 15 mg 14/35 days and weekly Dex in January 2015, Revlimid 15 mg change to 14/42 days and weekly Dex in June 2017, dexamethasone stopped in November 2017 - Velcade and dexamethasone weekly, 3 weeks on/1 week off started recently -She received Zometa monthly starting September 2009, switch to every 3 months in 2012 and lately off of it. - She was started on Velcade 1 mg/m, 3 weeks on 1 week off on 06/01/2018.  Dexamethasone 10 mg on days of Velcade.  Pt is here today for evaluation prior to Velcade C4 D8.     Labs done 08/24/2018 reviewed and showed WBC 5.9 HB 9.3 plts 147,000.  Chemistries WNL with K+ 3.7 normal LFTS.  Labs adequate for therapy.  Pt should continue Revlimid as recommended.  Follow-up with Dr. KWorthy Keelerin 4 weeks.    2.  CNS MALT like lymphoma, treated with chemotherapy and radiation, last brain MRI was in 2013, CT of the brain in 2018 with no evidence of recurrence.  She does have some dementia from it.  Continue Aricept 10 mg daily.  3.  Rash.  Nonspecific dry areas noted on hands.  Recommended for neosporin to area and moisturizer.  Notify if no improvement.    4.  HTN.  BP is 118/61.  Follow-up with PCP.    5.  DM.  Follow-up with PCP as directed.    30 minutes spent with more than 50% spent in counseling and coordination of care.    Current Status:  Pt is seen today for follow-up prior to Velcade.       Multiple myeloma ([facility]   07/09/2017 Initial Diagnosis    Multiple myeloma ([facility]    05/31/2018 -  Chemotherapy    The patient had bortezomib SQ (VELCADE) chemo injection 1.75 mg, 1  mg/m2 = 1.75 mg (100 % of original dose 1 mg/m2), Subcutaneous,  Once, 4 of 6 cycles Dose modification: 1 mg/m2 (original dose 1 mg/m2, Cycle 1, Reason: Provider Judgment) Administration: 1.75 mg (06/01/2018), 1.75 mg (06/08/2018), 1.75 mg (06/15/2018), 1.75 mg (06/29/2018), 1.75 mg (07/06/2018), 1.75 mg (07/13/2018), 1.75 mg (07/27/2018), 1.75 mg (08/03/2018), 1.75 mg (08/10/2018), 1.75 mg (08/24/2018)  for chemotherapy treatment.       Problem List Patient Active Problem List   Diagnosis Date Noted  . Altered mental status [R41.82] 11/10/2017  . Mild cognitive impairment with memory loss [G31.84] 07/15/2017  . E-coli UTI [N39.0, B96.20] 07/13/2017  . Syncope [R55] 07/09/2017  . Multiple myeloma ([facility] [C90.00] 07/09/2017  . Benign hypertension [I10] 07/09/2017  . Hypothyroidism [E03.9] 07/09/2017  . Diabetes mellitus without complication ([facility] [[I68.0]07/09/2017  . Elevated troponin [R74.8] 07/09/2017  . Lumbar compression fracture ([facility] [S32.000A] 07/09/2017  . Thrombocytopenia ([facility] [D69.6] 07/09/2017    Past Medical History Past Medical History:  Diagnosis Date  . Benign hypertension   . Cataract   . Central nervous system lymphoma ([facility]   . Diabetes mellitus without complication ([facility]   . H/O partial nephrectomy   . Hypokalemia   . Hypothyroidism   . Impaired cognition   . Multiple myeloma ([facility]  Dr Maylon Peppers, [facility]  . Thyroid disease     Past Surgical History Past Surgical History:  Procedure Laterality Date  . ABDOMINAL HYSTERECTOMY    . CRANIOTOMY     for lymphoma  . YAG LASER APPLICATION Right 2/67/1245   Procedure: YAG LASER APPLICATION;  Surgeon: [name], MD;  Location: [facility];  Service: Ophthalmology;  Laterality: Right;    Family History Family History  Problem Relation Age of Onset  . Depression Mother   . Diabetes Mother   . Heart disease Father   . Cancer - Prostate Brother   . Cancer Brother   . Cancer Sister      Social History  reports  that she has never smoked. She has never used smokeless tobacco. She reports that she does not drink alcohol or use drugs.  Medications  Current Outpatient Medications:  .  acetaminophen (TYLENOL) 500 MG tablet, Take 1,000 mg by mouth every 6 (six) hours as needed for mild pain. , Disp: , Rfl:  .  acyclovir (ZOVIRAX) 400 MG tablet, Take 400 mg by mouth 2 (two) times daily., Disp: , Rfl: 5 .  amLODipine (NORVASC) 5 MG tablet, Take 5 mg by mouth daily. , Disp: , Rfl:  .  bortezomib IV (VELCADE) 3.5 MG injection, Inject into the vein., Disp: , Rfl:  .  calcium-vitamin D (OSCAL WITH D) 500-200 MG-UNIT tablet, Take 1 tablet by mouth daily with breakfast., Disp: , Rfl:  .  dexamethasone (DECADRON) 4 MG tablet, TAKE TWO AND ONE-HALF TABLETS BY MOUTH WEEKLY FOR THREE WEEKS (THREE WEEKS ON, ONE WEEK OFF) IN A ROW AND REPEAT MONTHLY, Disp: , Rfl: 3 .  donepezil (ARICEPT) 10 MG tablet, Take 10 mg by mouth every evening., Disp: , Rfl: 3 .  ferrous sulfate 325 (65 FE) MG tablet, Take 325 mg by mouth daily with breakfast., Disp: , Rfl:  .  lenalidomide (REVLIMID) 10 MG capsule, Take 1 tablet (10 mg) daily by mouth for 14 days then stop for 14 days., Disp: 14 capsule, Rfl: 1 .  levothyroxine (SYNTHROID, LEVOTHROID) 25 MCG tablet, Take 25 mcg by mouth daily before breakfast., Disp: , Rfl:  .  losartan (COZAAR) 100 MG tablet, Take 100 mg by mouth daily., Disp: , Rfl:  .  metFORMIN (GLUCOPHAGE) 500 MG tablet, Take 250 mg by mouth every other day., Disp: , Rfl:  .  Multiple Vitamins-Minerals (MULTIVITAMIN WOMEN 50+ PO), Take by mouth., Disp: , Rfl:  .  potassium chloride (K-DUR,KLOR-CON) 10 MEQ tablet, Take 30 mEq by mouth daily., Disp: , Rfl: 5  Allergies Lotensin [benazepril hcl]  Review of Systems Review of Systems - Oncology ROS negative other than rash on hands.     Physical Exam  Vitals Wt Readings from Last 3 Encounters:  08/24/18 136 lb 6.4 oz (61.9 kg)  08/10/18 135 lb 6.4 oz (61.4 kg)   08/03/18 134 lb 6.4 oz (61 kg)   Temp Readings from Last 3 Encounters:  08/24/18 98.5 F (36.9 C) (Oral)  08/10/18 98.4 F (36.9 C) (Oral)  08/03/18 98.4 F (36.9 C) (Oral)   BP Readings from Last 3 Encounters:  08/24/18 116/61  08/10/18 124/72  08/03/18 118/65   Pulse Readings from Last 3 Encounters:  08/24/18 85  08/10/18 (!) 59  08/03/18 71    Constitutional: Well-developed, well-nourished, and in no distress.   HENT: Head: Normocephalic and atraumatic.  Mouth/Throat: No oropharyngeal exudate. Mucosa moist. Eyes: Pupils are equal, round, and reactive to light. Conjunctivae are  normal. No scleral icterus.  Neck: Normal range of motion. Neck supple. No JVD present.  Cardiovascular: Normal rate, regular rhythm and normal heart sounds.  Exam reveals no gallop and no friction rub.   No murmur heard. Pulmonary/Chest: Effort normal and breath sounds normal. No respiratory distress. No wheezes.No rales.  Abdominal: Soft. Bowel sounds are normal. No distension. There is no tenderness. There is no guarding.  Musculoskeletal: No edema or tenderness.  Lymphadenopathy: No cervical, axillary or supraclavicular adenopathy.  Neurological: Alert and oriented to person, place, and time. No cranial nerve deficit.  Skin: Skin is warm and dry. No rash noted. No erythema. No pallor. Minor dryness noted on hands.   Psychiatric: Affect and judgment normal.   Labs Appointment on 08/24/2018  Component Date Value Ref Range Status  . WBC 08/24/2018 5.9  4.0 - 10.5 K/uL Final  . RBC 08/24/2018 3.06* 3.87 - 5.11 MIL/uL Final  . Hemoglobin 08/24/2018 9.3* 12.0 - 15.0 g/dL Final  . HCT 08/24/2018 28.8* 36.0 - 46.0 % Final  . MCV 08/24/2018 94.1  78.0 - 100.0 fL Final  . MCH 08/24/2018 30.4  26.0 - 34.0 pg Final  . MCHC 08/24/2018 32.3  30.0 - 36.0 g/dL Final  . RDW 08/24/2018 17.8* 11.5 - 15.5 % Final  . Platelets 08/24/2018 147* 150 - 400 K/uL Final  . Neutrophils Relative % 08/24/2018 78  %  Final  . Neutro Abs 08/24/2018 4.6  1.7 - 7.7 K/uL Final  . Lymphocytes Relative 08/24/2018 13  % Final  . Lymphs Abs 08/24/2018 0.8  0.7 - 4.0 K/uL Final  . Monocytes Relative 08/24/2018 6  % Final  . Monocytes Absolute 08/24/2018 0.4  0.1 - 1.0 K/uL Final  . Eosinophils Relative 08/24/2018 3  % Final  . Eosinophils Absolute 08/24/2018 0.2  0.0 - 0.7 K/uL Final  . Basophils Relative 08/24/2018 0  % Final  . Basophils Absolute 08/24/2018 0.0  0.0 - 0.1 K/uL Final   Performed at [facility], [address], [place], [place] [address]  . Sodium 08/24/2018 139  135 - 145 mmol/L Final  . Potassium 08/24/2018 3.7  3.5 - 5.1 mmol/L Final  . Chloride 08/24/2018 107  98 - 111 mmol/L Final  . CO2 08/24/2018 25  22 - 32 mmol/L Final  . Glucose, Bld 08/24/2018 164* 70 - 99 mg/dL Final  . BUN 08/24/2018 21  8 - 23 mg/dL Final  . Creatinine, Ser 08/24/2018 1.31* 0.44 - 1.00 mg/dL Final  . Calcium 08/24/2018 8.6* 8.9 - 10.3 mg/dL Final  . Total Protein 08/24/2018 7.9  6.5 - 8.1 g/dL Final  . Albumin 08/24/2018 2.9* 3.5 - 5.0 g/dL Final  . AST 08/24/2018 35  15 - 41 U/L Final  . ALT 08/24/2018 20  0 - 44 U/L Final  . Alkaline Phosphatase 08/24/2018 42  38 - 126 U/L Final  . Total Bilirubin 08/24/2018 0.3  0.3 - 1.2 mg/dL Final  . GFR calc non Af Amer 08/24/2018 39* >60 mL/min Final  . GFR calc Af Amer 08/24/2018 46* >60 mL/min Final   Comment: (NOTE) The eGFR has been calculated using the CKD EPI equation. This calculation has not been validated in all clinical situations. eGFR's persistently <60 mL/min signify possible Chronic Kidney Disease.   Georgiann Hahn gap 08/24/2018 7  5 - 15 Final   Performed at [facility], [address] [place], [place] [address]     Pathology No orders of the defined types were placed in  this encounter.      Zoila Shutter MD

## 2018-08-26 MED ORDER — PEGFILGRASTIM 6 MG/0.6ML ~~LOC~~ PSKT
PREFILLED_SYRINGE | SUBCUTANEOUS | Status: AC
  Filled 2018-08-26: qty 0.6

## 2018-08-31 ENCOUNTER — Encounter (HOSPITAL_COMMUNITY): Payer: Self-pay

## 2018-08-31 ENCOUNTER — Inpatient Hospital Stay (HOSPITAL_COMMUNITY): Payer: Medicare PPO

## 2018-08-31 VITALS — BP 125/67 | HR 64 | Temp 98.8°F | Resp 18

## 2018-08-31 DIAGNOSIS — C9000 Multiple myeloma not having achieved remission: Secondary | ICD-10-CM | POA: Diagnosis not present

## 2018-08-31 DIAGNOSIS — Z5112 Encounter for antineoplastic immunotherapy: Secondary | ICD-10-CM | POA: Diagnosis not present

## 2018-08-31 DIAGNOSIS — E119 Type 2 diabetes mellitus without complications: Secondary | ICD-10-CM | POA: Diagnosis not present

## 2018-08-31 DIAGNOSIS — I10 Essential (primary) hypertension: Secondary | ICD-10-CM | POA: Diagnosis not present

## 2018-08-31 LAB — CBC WITH DIFFERENTIAL/PLATELET
BASOS ABS: 0 10*3/uL (ref 0.0–0.1)
Basophils Relative: 1 %
Eosinophils Absolute: 0.2 10*3/uL (ref 0.0–0.7)
Eosinophils Relative: 3 %
HEMATOCRIT: 28.8 % — AB (ref 36.0–46.0)
HEMOGLOBIN: 9.3 g/dL — AB (ref 12.0–15.0)
LYMPHS PCT: 18 %
Lymphs Abs: 0.9 10*3/uL (ref 0.7–4.0)
MCH: 30 pg (ref 26.0–34.0)
MCHC: 32.3 g/dL (ref 30.0–36.0)
MCV: 92.9 fL (ref 78.0–100.0)
Monocytes Absolute: 0.6 10*3/uL (ref 0.1–1.0)
Monocytes Relative: 11 %
NEUTROS ABS: 3.4 10*3/uL (ref 1.7–7.7)
NEUTROS PCT: 67 %
Platelets: 91 10*3/uL — ABNORMAL LOW (ref 150–400)
RBC: 3.1 MIL/uL — ABNORMAL LOW (ref 3.87–5.11)
RDW: 17.6 % — AB (ref 11.5–15.5)
WBC: 5.1 10*3/uL (ref 4.0–10.5)

## 2018-08-31 LAB — COMPREHENSIVE METABOLIC PANEL
ALK PHOS: 40 U/L (ref 38–126)
ALT: 21 U/L (ref 0–44)
AST: 30 U/L (ref 15–41)
Albumin: 2.8 g/dL — ABNORMAL LOW (ref 3.5–5.0)
Anion gap: 6 (ref 5–15)
BILIRUBIN TOTAL: 0.6 mg/dL (ref 0.3–1.2)
BUN: 18 mg/dL (ref 8–23)
CALCIUM: 8.5 mg/dL — AB (ref 8.9–10.3)
CO2: 25 mmol/L (ref 22–32)
CREATININE: 1.26 mg/dL — AB (ref 0.44–1.00)
Chloride: 107 mmol/L (ref 98–111)
GFR calc Af Amer: 47 mL/min — ABNORMAL LOW (ref >60)
GFR, EST NON AFRICAN AMERICAN: 41 mL/min — AB (ref >60)
Glucose, Bld: 128 mg/dL — ABNORMAL HIGH (ref 70–99)
Potassium: 3.9 mmol/L (ref 3.5–5.1)
Sodium: 138 mmol/L (ref 135–145)
TOTAL PROTEIN: 8.1 g/dL (ref 6.5–8.1)

## 2018-08-31 MED ORDER — HEPARIN SOD (PORK) LOCK FLUSH 100 UNIT/ML IV SOLN
INTRAVENOUS | Status: AC
  Filled 2018-08-31: qty 5

## 2018-08-31 MED ORDER — DEXAMETHASONE 4 MG PO TABS
10.0000 mg | ORAL_TABLET | Freq: Once | ORAL | Status: AC
  Administered 2018-08-31: 10 mg via ORAL
  Filled 2018-08-31: qty 2.5

## 2018-08-31 MED ORDER — PROCHLORPERAZINE MALEATE 10 MG PO TABS: 10.0000 mg | ORAL_TABLET | Freq: Once | ORAL | Status: DC

## 2018-08-31 MED ORDER — BORTEZOMIB CHEMO SQ INJECTION 3.5 MG (2.5MG/ML)
1.0000 mg/m2 | Freq: Once | INTRAMUSCULAR | Status: AC
  Administered 2018-08-31: 1.75 mg via SUBCUTANEOUS
  Filled 2018-08-31: qty 0.7

## 2018-08-31 NOTE — Progress Notes (Signed)
Platelet count of 91,000 reviewed with Dr. Walden Field.  Okay to give Velcade today per MD.

## 2018-08-31 NOTE — Patient Instructions (Signed)
[facility] Discharge Instructions for Patients Receiving Chemotherapy   Beginning January 23rd 2017 lab work for the Cancer Center will be done in the  Main lab at [facility] on 1st floor. If you have a lab appointment with the Cancer Center please come in thru the  Main Entrance and check in at the main information desk   Today you received the following chemotherapy agents Velcade injection. Follow-up as scheduled. Call clinic for any questions or concerns  To help prevent nausea and vomiting after your treatment, we encourage you to take your nausea medication   If you develop nausea and vomiting, or diarrhea that is not controlled by your medication, call the clinic.  The clinic phone number is [phone]. Office hours are Monday-Friday 8:30am-5:00pm.  BELOW ARE SYMPTOMS THAT SHOULD BE REPORTED IMMEDIATELY:  *FEVER GREATER THAN 101.0 F  *CHILLS WITH OR WITHOUT FEVER  NAUSEA AND VOMITING THAT IS NOT CONTROLLED WITH YOUR NAUSEA MEDICATION  *UNUSUAL SHORTNESS OF BREATH  *UNUSUAL BRUISING OR BLEEDING  TENDERNESS IN MOUTH AND THROAT WITH OR WITHOUT PRESENCE OF ULCERS  *URINARY PROBLEMS  *BOWEL PROBLEMS  UNUSUAL RASH Items with * indicate a potential emergency and should be followed up as soon as possible. If you have an emergency after office hours please contact your primary care physician or go to the nearest emergency department.  Please call the clinic during office hours if you have any questions or concerns.   You may also contact the Patient Navigator at [phone] should you have any questions or need assistance in obtaining follow up care.      Resources For Cancer Patients and their Caregivers ? American Cancer Society: Can assist with transportation, wigs, general needs, runs Look Good Feel Better.        1-888-227-6333 ? Cancer Care: Provides financial assistance, online support groups, medication/co-pay assistance.   1-800-813-HOPE (4673) ? [facility] Assists [place] cancer patients and their families through emotional , educational and financial support.  336-427-4357 ? [place] DSS Where to apply for food stamps, Medicaid and utility assistance. 336-342-1394 ? RCATS: Transportation to medical appointments. 336-347-2287 ? Social Security Administration: May apply for disability if have a Stage IV cancer. 336-342-7796 1-800-772-1213 ? [place] Aging, Disability and Transit Services: Assists with nutrition, care and transit needs. [phone]         

## 2018-08-31 NOTE — Progress Notes (Signed)
Amanda Wu tolerated Velcade injection well without complaints or incident. Pt has a few small dry skin areas on her checks and hand.Pt denies any itching to these areas Pt encouraged to use a moisturizer like Eucerin cream 3 to 4 times a day to see if that helps and if the areas become worse to call us. VSS Pt continues to take her Revlimid as prescribed. Pt discharged self ambulatory in satisfactory condition accompanied by a family member

## 2018-09-07 ENCOUNTER — Inpatient Hospital Stay (HOSPITAL_COMMUNITY): Payer: Medicare PPO

## 2018-09-07 ENCOUNTER — Encounter (HOSPITAL_COMMUNITY): Payer: Self-pay

## 2018-09-07 VITALS — BP 117/61 | HR 67 | Temp 98.8°F | Resp 18 | Wt 135.2 lb

## 2018-09-07 DIAGNOSIS — Z5112 Encounter for antineoplastic immunotherapy: Secondary | ICD-10-CM | POA: Diagnosis not present

## 2018-09-07 DIAGNOSIS — C9000 Multiple myeloma not having achieved remission: Secondary | ICD-10-CM

## 2018-09-07 DIAGNOSIS — E119 Type 2 diabetes mellitus without complications: Secondary | ICD-10-CM | POA: Diagnosis not present

## 2018-09-07 DIAGNOSIS — I10 Essential (primary) hypertension: Secondary | ICD-10-CM | POA: Diagnosis not present

## 2018-09-07 LAB — COMPREHENSIVE METABOLIC PANEL
ALK PHOS: 39 U/L (ref 38–126)
ALT: 16 U/L (ref 0–44)
ANION GAP: 7 (ref 5–15)
AST: 28 U/L (ref 15–41)
Albumin: 2.9 g/dL — ABNORMAL LOW (ref 3.5–5.0)
BUN: 19 mg/dL (ref 8–23)
CALCIUM: 9 mg/dL (ref 8.9–10.3)
CO2: 26 mmol/L (ref 22–32)
CREATININE: 1.37 mg/dL — AB (ref 0.44–1.00)
Chloride: 106 mmol/L (ref 98–111)
GFR calc Af Amer: 43 mL/min — ABNORMAL LOW (ref >60)
GFR, EST NON AFRICAN AMERICAN: 37 mL/min — AB (ref >60)
Glucose, Bld: 127 mg/dL — ABNORMAL HIGH (ref 70–99)
Potassium: 4.4 mmol/L (ref 3.5–5.1)
SODIUM: 139 mmol/L (ref 135–145)
Total Bilirubin: 0.5 mg/dL (ref 0.3–1.2)
Total Protein: 8.1 g/dL (ref 6.5–8.1)

## 2018-09-07 LAB — CBC WITH DIFFERENTIAL/PLATELET
Basophils Absolute: 0.1 10*3/uL (ref 0.0–0.1)
Basophils Relative: 1 %
EOS ABS: 0.2 10*3/uL (ref 0.0–0.7)
EOS PCT: 3 %
HCT: 29.6 % — ABNORMAL LOW (ref 36.0–46.0)
HEMOGLOBIN: 9.6 g/dL — AB (ref 12.0–15.0)
LYMPHS ABS: 0.9 10*3/uL (ref 0.7–4.0)
LYMPHS PCT: 18 %
MCH: 30.4 pg (ref 26.0–34.0)
MCHC: 32.4 g/dL (ref 30.0–36.0)
MCV: 93.7 fL (ref 78.0–100.0)
MONOS PCT: 12 %
Monocytes Absolute: 0.6 10*3/uL (ref 0.1–1.0)
Neutro Abs: 3.2 10*3/uL (ref 1.7–7.7)
Neutrophils Relative %: 66 %
PLATELETS: 136 10*3/uL — AB (ref 150–400)
RBC: 3.16 MIL/uL — AB (ref 3.87–5.11)
RDW: 18.1 % — ABNORMAL HIGH (ref 11.5–15.5)
WBC: 4.9 10*3/uL (ref 4.0–10.5)

## 2018-09-07 MED ORDER — BORTEZOMIB CHEMO SQ INJECTION 3.5 MG (2.5MG/ML)
1.0000 mg/m2 | Freq: Once | INTRAMUSCULAR | Status: AC
  Administered 2018-09-07: 1.75 mg via SUBCUTANEOUS
  Filled 2018-09-07: qty 0.7

## 2018-09-07 MED ORDER — LENALIDOMIDE 10 MG PO CAPS: ORAL_CAPSULE | ORAL | 1 refills | Status: DC

## 2018-09-07 MED ORDER — DEXAMETHASONE 4 MG PO TABS
10.0000 mg | ORAL_TABLET | Freq: Once | ORAL | Status: AC
  Administered 2018-09-07: 10 mg via ORAL
  Filled 2018-09-07: qty 2.5

## 2018-09-07 MED ORDER — PROCHLORPERAZINE MALEATE 10 MG PO TABS: 10.0000 mg | ORAL_TABLET | Freq: Once | ORAL | Status: DC

## 2018-09-07 NOTE — Progress Notes (Signed)
Amanda Wu tolerated Velcade injection well without complaints or incident. Labs reviewed prior to administering this medication. Pt continues to take her Revlimid as prescribed without issues and new script signed by Dr. Walden Field and given to [facility] for refill verification today.VSS Pt discharged self ambulatory in satisfactory condition accompanied by family member

## 2018-09-07 NOTE — Patient Instructions (Signed)
[facility] Discharge Instructions for Patients Receiving Chemotherapy   Beginning January 23rd 2017 lab work for the Cancer Center will be done in the  Main lab at [facility] on 1st floor. If you have a lab appointment with the Cancer Center please come in thru the  Main Entrance and check in at the main information desk   Today you received the following chemotherapy agents Velcade injection. Follow-up as scheduled. Call clinic for any questions or concerns  To help prevent nausea and vomiting after your treatment, we encourage you to take your nausea medication   If you develop nausea and vomiting, or diarrhea that is not controlled by your medication, call the clinic.  The clinic phone number is [phone]. Office hours are Monday-Friday 8:30am-5:00pm.  BELOW ARE SYMPTOMS THAT SHOULD BE REPORTED IMMEDIATELY:  *FEVER GREATER THAN 101.0 F  *CHILLS WITH OR WITHOUT FEVER  NAUSEA AND VOMITING THAT IS NOT CONTROLLED WITH YOUR NAUSEA MEDICATION  *UNUSUAL SHORTNESS OF BREATH  *UNUSUAL BRUISING OR BLEEDING  TENDERNESS IN MOUTH AND THROAT WITH OR WITHOUT PRESENCE OF ULCERS  *URINARY PROBLEMS  *BOWEL PROBLEMS  UNUSUAL RASH Items with * indicate a potential emergency and should be followed up as soon as possible. If you have an emergency after office hours please contact your primary care physician or go to the nearest emergency department.  Please call the clinic during office hours if you have any questions or concerns.   You may also contact the Patient Navigator at [phone] should you have any questions or need assistance in obtaining follow up care.      Resources For Cancer Patients and their Caregivers ? American Cancer Society: Can assist with transportation, wigs, general needs, runs Look Good Feel Better.        1-888-227-6333 ? Cancer Care: Provides financial assistance, online support groups, medication/co-pay assistance.   1-800-813-HOPE (4673) ? [facility] Assists [place] cancer patients and their families through emotional , educational and financial support.  336-427-4357 ? [place] DSS Where to apply for food stamps, Medicaid and utility assistance. 336-342-1394 ? RCATS: Transportation to medical appointments. 336-347-2287 ? Social Security Administration: May apply for disability if have a Stage IV cancer. 336-342-7796 1-800-772-1213 ? [place] Aging, Disability and Transit Services: Assists with nutrition, care and transit needs. [phone]         

## 2018-09-14 ENCOUNTER — Encounter (HOSPITAL_COMMUNITY): Payer: Self-pay | Admitting: Hematology

## 2018-09-14 ENCOUNTER — Inpatient Hospital Stay (HOSPITAL_COMMUNITY): Payer: Medicare PPO

## 2018-09-14 ENCOUNTER — Inpatient Hospital Stay (HOSPITAL_COMMUNITY): Payer: Medicare PPO | Attending: Hematology | Admitting: Hematology

## 2018-09-14 ENCOUNTER — Other Ambulatory Visit: Payer: Self-pay

## 2018-09-14 VITALS — BP 117/74 | HR 84 | Temp 98.3°F | Resp 16 | Wt 134.4 lb

## 2018-09-14 DIAGNOSIS — Z8572 Personal history of non-Hodgkin lymphomas: Secondary | ICD-10-CM | POA: Diagnosis not present

## 2018-09-14 DIAGNOSIS — Z5112 Encounter for antineoplastic immunotherapy: Secondary | ICD-10-CM | POA: Insufficient documentation

## 2018-09-14 DIAGNOSIS — E039 Hypothyroidism, unspecified: Secondary | ICD-10-CM

## 2018-09-14 DIAGNOSIS — Z23 Encounter for immunization: Secondary | ICD-10-CM | POA: Diagnosis not present

## 2018-09-14 DIAGNOSIS — C9000 Multiple myeloma not having achieved remission: Secondary | ICD-10-CM | POA: Insufficient documentation

## 2018-09-14 LAB — COMPREHENSIVE METABOLIC PANEL
ALK PHOS: 38 U/L (ref 38–126)
ALT: 15 U/L (ref 0–44)
AST: 28 U/L (ref 15–41)
Albumin: 2.9 g/dL — ABNORMAL LOW (ref 3.5–5.0)
Anion gap: 8 (ref 5–15)
BILIRUBIN TOTAL: 0.5 mg/dL (ref 0.3–1.2)
BUN: 19 mg/dL (ref 8–23)
CALCIUM: 8.8 mg/dL — AB (ref 8.9–10.3)
CO2: 25 mmol/L (ref 22–32)
Chloride: 106 mmol/L (ref 98–111)
Creatinine, Ser: 1.13 mg/dL — ABNORMAL HIGH (ref 0.44–1.00)
GFR calc Af Amer: 54 mL/min — ABNORMAL LOW (ref >60)
GFR calc non Af Amer: 47 mL/min — ABNORMAL LOW (ref >60)
GLUCOSE: 158 mg/dL — AB (ref 70–99)
POTASSIUM: 3.9 mmol/L (ref 3.5–5.1)
Sodium: 139 mmol/L (ref 135–145)
TOTAL PROTEIN: 7.7 g/dL (ref 6.5–8.1)

## 2018-09-14 LAB — CBC WITH DIFFERENTIAL/PLATELET
Basophils Absolute: 0.1 10*3/uL (ref 0.0–0.1)
Basophils Relative: 1 %
Eosinophils Absolute: 0.2 10*3/uL (ref 0.0–0.7)
Eosinophils Relative: 4 %
HCT: 30.4 % — ABNORMAL LOW (ref 36.0–46.0)
HEMOGLOBIN: 9.8 g/dL — AB (ref 12.0–15.0)
LYMPHS ABS: 0.7 10*3/uL (ref 0.7–4.0)
Lymphocytes Relative: 14 %
MCH: 30.2 pg (ref 26.0–34.0)
MCHC: 32.2 g/dL (ref 30.0–36.0)
MCV: 93.8 fL (ref 78.0–100.0)
Monocytes Absolute: 0.3 10*3/uL (ref 0.1–1.0)
Monocytes Relative: 6 %
NEUTROS PCT: 75 %
Neutro Abs: 3.5 10*3/uL (ref 1.7–7.7)
Platelets: 130 10*3/uL — ABNORMAL LOW (ref 150–400)
RBC: 3.24 MIL/uL — AB (ref 3.87–5.11)
RDW: 17.9 % — ABNORMAL HIGH (ref 11.5–15.5)
WBC: 4.7 10*3/uL (ref 4.0–10.5)

## 2018-09-14 LAB — LACTATE DEHYDROGENASE: LDH: 164 U/L (ref 98–192)

## 2018-09-14 MED ORDER — INFLUENZA VAC SPLIT QUAD 0.5 ML IM SUSY: 0.5000 mL | PREFILLED_SYRINGE | Freq: Once | INTRAMUSCULAR | Status: DC

## 2018-09-14 MED ORDER — INFLUENZA VAC SPLIT HIGH-DOSE 0.5 ML IM SUSY
0.5000 mL | PREFILLED_SYRINGE | INTRAMUSCULAR | Status: AC
  Administered 2018-09-14: 0.5 mL via INTRAMUSCULAR

## 2018-09-14 MED ORDER — INFLUENZA VAC SPLIT HIGH-DOSE 0.5 ML IM SUSY
PREFILLED_SYRINGE | INTRAMUSCULAR | Status: AC
  Filled 2018-09-14: qty 0.5

## 2018-09-14 NOTE — Patient Instructions (Signed)
[facility] Discharge Instructions     Thank you for choosing [facility] to provide your oncology and hematology care.  To afford each patient quality time with our provider, please arrive at least 15 minutes before your scheduled appointment time.   If you have a lab appointment with the Cancer Center please come in thru the  Main Entrance and check in at the main information desk  You need to re-schedule your appointment should you arrive 10 or more minutes late.  We strive to give you quality time with our providers, and arriving late affects you and other patients whose appointments are after yours.  Also, if you no show three or more times for appointments you may be dismissed from the clinic at the providers discretion.     Again, thank you for choosing [facility].  Our hope is that these requests will decrease the amount of time that you wait before being seen by our physicians.       _____________________________________________________________  Should you have questions after your visit to [facility], please contact our office at [phone] between the hours of 8:00 a.m. and 4:30 p.m.  Voicemails left after 4:00 p.m. will not be returned until the following business day.  For prescription refill requests, have your pharmacy contact our office and allow 72 hours.    Cancer Center Support Programs:   > Cancer Support Group  2nd Tuesday of the month 1pm-2pm, Journey Room    

## 2018-09-14 NOTE — Progress Notes (Signed)
[facility] presents today for injection per MD orders. Influenza high dose administered SQ in left Upper Arm. Administration without incident. Patient tolerated well.

## 2018-09-14 NOTE — Assessment & Plan Note (Signed)
1.  IgG lambda multiple myeloma: - Revlimid 33m and dexamethasone 3 weeks on 1 week off started in September 2009, changed to 2 weeks on 2 weeks off in November 2010, changed to 2 weeks on 3 weeks of in May 2012, Revlimid 15 mg 14/35 days and weekly Dex in January 2015, Revlimid 15 mg change to 14/42 days and weekly Dex in June 2017, dexamethasone stopped in November 2017 - Velcade and dexamethasone weekly, 3 weeks on/1 week off started recently -Dr. LMaylon Peppershas called me to treat this patient locally so that she can follow-up with him periodically.  -She received Zometa monthly starting September 2009, switch to every 3 months in 2012 and lately off of it. - She was started on Velcade 1 mg/m, 3 weeks on 1 week off on 06/01/2018.  Today is her off week.  She has tolerated it very well.  Denies any neuropathy symptoms.  Takes dexamethasone 10 mg on days of Velcade. - Cycle 3-day 1 was started on 07/27/2018.  She is tolerating Velcade very well. - I have reviewed the results of the M spike which was stable at 1.9.  This was 1.93 on 05/20/2018.  Free light chain ratio is 0.2, previously 0.25.  Lambda light chains were previously 203.  They have come down to 130. -Since her M spike is not improving, I have recommended adding low-dose Revlimid.  We have added Revlimid 10 mg on a 2 weeks on 2 weeks of basis.  She is continuing Velcade on a 3 weeks on 1 week off basis.  She will also continue dexamethasone 10 mg on days of Velcade. -She started her cycle 1 of Revlimid on 08/14/2018.  She tolerated it without any side effects.  She started cycle 2 on 09/11/2018.  Did not experience any diarrhea.  She had some dryness of the skin which improved with moisturizing lotion.  Denies any tingling or numbness next remedies.  Continues to struggle with memory problems. - SPEP and free light chains from today are pending.  Her creatinine has improved from 1.5-1.1 since the start of Revlimid.  I will see her back in 4 weeks for  follow-up.  She will receive flu vaccine today.  2.  Remote history of CNS MALT like lymphoma, treated with chemotherapy and radiation, last brain MRI was in 2013, CT of the brain in 2018 with no evidence of recurrence.  She does have some dementia from it.  She will continue Aricept 10 mg daily.

## 2018-09-14 NOTE — Progress Notes (Signed)
Amanda Wu, Amanda Wu [id number]   CLINIC:  Medical Oncology/Hematology  PCP:  Monico Blitz, MD [place] [address] [phone]   REASON FOR VISIT: Follow-up for multiple myeloma  CURRENT THERAPY: Velcade, Revlimid, and dexamethasone  BRIEF ONCOLOGIC HISTORY:    Multiple myeloma (Amanda Wu)   07/09/2017 Initial Diagnosis    Multiple myeloma (Amanda Wu)    05/31/2018 -  Chemotherapy    The patient had bortezomib SQ (VELCADE) chemo injection 1.75 mg, 1 mg/m2 = 1.75 mg (100 % of original dose 1 mg/m2), Subcutaneous,  Once, 4 of 6 cycles Dose modification: 1 mg/m2 (original dose 1 mg/m2, Cycle 1, Reason: Provider Judgment) Administration: 1.75 mg (06/01/2018), 1.75 mg (06/08/2018), 1.75 mg (06/15/2018), 1.75 mg (06/29/2018), 1.75 mg (07/06/2018), 1.75 mg (07/13/2018), 1.75 mg (07/27/2018), 1.75 mg (08/03/2018), 1.75 mg (08/10/2018), 1.75 mg (08/24/2018), 1.75 mg (08/31/2018), 1.75 mg (09/07/2018)  for chemotherapy treatment.        INTERVAL HISTORY:  [name] 72 y.o. female returns for routine follow-up for multiple myeloma. Patient is here today with her friend. She is doing very well with treatment. She has reported some dry skin but this is improving with lotions she is using. She does report night sweats. Her appetite and energy level is 75% and she is maintaining her weight well. She denies any new pains. Denies any nausea, vomiting, or diarrhea. Denies any fevers, chills, or weight loss.    REVIEW OF SYSTEMS:  Review of Systems  Constitutional:       Night sweats.  All other systems reviewed and are negative.    PAST MEDICAL/SURGICAL HISTORY:  Past Medical History:  Diagnosis Date  . Benign hypertension   . Cataract   . Central nervous system lymphoma (Amanda Wu)   . Diabetes mellitus without complication (Amanda Wu)   . H/O partial nephrectomy   . Hypokalemia   . Hypothyroidism   . Impaired cognition   . Multiple myeloma (Amanda Wu)    Dr Maylon Peppers, [facility]    . Thyroid disease    Past Surgical History:  Procedure Laterality Date  . ABDOMINAL HYSTERECTOMY    . CRANIOTOMY     for lymphoma  . YAG LASER APPLICATION Right 2/53/6644   Procedure: YAG LASER APPLICATION;  Surgeon: [name], MD;  Location: [facility];  Service: Ophthalmology;  Laterality: Right;     SOCIAL HISTORY:  Social History   Socioeconomic History  . Marital status: Single    Spouse name: Not on file  . Number of children: Not on file  . Years of education: Not on file  . Highest education level: Not on file  Occupational History  . Not on file  Social Needs  . Financial resource strain: Not on file  . Food insecurity:    Worry: Not on file    Inability: Not on file  . Transportation needs:    Medical: Not on file    Non-medical: Not on file  Tobacco Use  . Smoking status: Never Smoker  . Smokeless tobacco: Never Used  Substance and Sexual Activity  . Alcohol use: No  . Drug use: No  . Sexual activity: Not on file  Lifestyle  . Physical activity:    Days per week: Not on file    Minutes per session: Not on file  . Stress: Not on file  Relationships  . Social connections:    Talks on phone: Not on file    Gets together: Not on file  Attends religious service: Not on file    Active member of club or organization: Not on file    Attends meetings of clubs or organizations: Not on file    Relationship status: Not on file  . Intimate partner violence:    Fear of current or ex partner: Not on file    Emotionally abused: Not on file    Physically abused: Not on file    Forced sexual activity: Not on file  Other Topics Concern  . Not on file  Social History Narrative  . Not on file    FAMILY HISTORY:  Family History  Problem Relation Age of Onset  . Depression Mother   . Diabetes Mother   . Heart disease Father   . Cancer - Prostate Brother   . Cancer Brother   . Cancer Sister     CURRENT MEDICATIONS:  Outpatient Encounter Medications  as of 09/14/2018  Medication Sig Note  . ACCU-CHEK SMARTVIEW test strip    . acetaminophen (TYLENOL) 500 MG tablet Take 1,000 mg by mouth every 6 (six) hours as needed for mild pain.    Marland Kitchen acyclovir (ZOVIRAX) 400 MG tablet Take 400 mg by mouth 2 (two) times daily.   Marland Kitchen acyclovir (ZOVIRAX) 400 MG tablet TAKE ONE TABLET BY MOUTH TWICE DAILY   . amLODipine (NORVASC) 5 MG tablet Take 5 mg by mouth daily.    . bortezomib IV (VELCADE) 3.5 MG injection Inject into the vein.   . calcium-vitamin D (OSCAL WITH D) 500-200 MG-UNIT tablet Take 1 tablet by mouth daily with breakfast.   . dexamethasone (DECADRON) 4 MG tablet TAKE TWO AND ONE-HALF TABLETS BY MOUTH WEEKLY FOR THREE WEEKS (THREE WEEKS ON, ONE WEEK OFF) IN A ROW AND REPEAT MONTHLY 06/22/2018: Patient states that she is given the dexamethasone while here getting her velcade injection  . donepezil (ARICEPT) 10 MG tablet Take 10 mg by mouth every evening.   . ferrous sulfate 325 (65 FE) MG tablet Take 325 mg by mouth daily with breakfast.   . lenalidomide (REVLIMID) 10 MG capsule Take 1 tablet (10 mg) daily by mouth for 14 days then stop for 14 days.   Marland Kitchen levothyroxine (SYNTHROID, LEVOTHROID) 25 MCG tablet Take 25 mcg by mouth daily before breakfast.   . losartan (COZAAR) 100 MG tablet Take 100 mg by mouth daily.   . metFORMIN (GLUCOPHAGE) 500 MG tablet Take 250 mg by mouth every other day.   . Multiple Vitamins-Minerals (MULTIVITAMIN WOMEN 50+ PO) Take by mouth.   . potassium chloride (K-DUR,KLOR-CON) 10 MEQ tablet Take 30 mEq by mouth daily.   . [DISCONTINUED] bortezomib IV (VELCADE) 3.5 MG injection Inject into the vein.   . [DISCONTINUED] Influenza vac split quadrivalent PF (FLUARIX) injection 0.5 mL     No facility-administered encounter medications on file as of 09/14/2018.     ALLERGIES:  Allergies  Allergen Reactions  . Lotensin [Benazepril Hcl]     Facial swelling (Angioedema) Note: ARBs should also be contraindicated, but she is on  Losartan!     PHYSICAL EXAM:  ECOG Performance status: 1  Vitals:   09/14/18 1147  BP: 117/74  Pulse: 84  Resp: 16  Temp: 98.3 F (36.8 C)  SpO2: 100%   Filed Weights   09/14/18 1147  Weight: 134 lb 6.4 oz (61 kg)    Physical Exam  Constitutional: She is oriented to person, place, and time. She appears well-developed and well-nourished.  Cardiovascular: Normal rate, regular rhythm and normal  heart sounds.  Pulmonary/Chest: Effort normal and breath sounds normal.  Musculoskeletal: Normal range of motion.  Neurological: She is alert and oriented to person, place, and time.  Skin: Skin is warm and dry.  Psychiatric: She has a normal mood and affect. Her behavior is normal. Judgment and thought content normal.     LABORATORY DATA:  I have reviewed the labs as listed.  CBC    Component Value Date/Time   WBC 4.7 09/14/2018 1044   RBC 3.24 (L) 09/14/2018 1044   HGB 9.8 (L) 09/14/2018 1044   HCT 30.4 (L) 09/14/2018 1044   PLT 130 (L) 09/14/2018 1044   MCV 93.8 09/14/2018 1044   MCH 30.2 09/14/2018 1044   MCHC 32.2 09/14/2018 1044   RDW 17.9 (H) 09/14/2018 1044   LYMPHSABS 0.7 09/14/2018 1044   MONOABS 0.3 09/14/2018 1044   EOSABS 0.2 09/14/2018 1044   BASOSABS 0.1 09/14/2018 1044   CMP Latest Ref Rng & Units 09/14/2018 09/07/2018 08/31/2018  Glucose 70 - 99 mg/dL 158(H) 127(H) 128(H)  BUN 8 - 23 mg/dL '19 19 18  ' Creatinine 0.44 - 1.00 mg/dL 1.13(H) 1.37(H) 1.26(H)  Sodium 135 - 145 mmol/L 139 139 138  Potassium 3.5 - 5.1 mmol/L 3.9 4.4 3.9  Chloride 98 - 111 mmol/L 106 106 107  CO2 22 - 32 mmol/L '25 26 25  ' Calcium 8.9 - 10.3 mg/dL 8.8(L) 9.0 8.5(L)  Total Protein 6.5 - 8.1 g/dL 7.7 8.1 8.1  Total Bilirubin 0.3 - 1.2 mg/dL 0.5 0.5 0.6  Alkaline Phos 38 - 126 U/L 38 39 40  AST 15 - 41 U/L '28 28 30  ' ALT 0 - 44 U/L '15 16 21        ' ASSESSMENT & PLAN:   Multiple myeloma (Amanda Wu) 1.  IgG lambda multiple myeloma: - Revlimid 20m and dexamethasone 3 weeks on 1 week  off started in September 2009, changed to 2 weeks on 2 weeks off in November 2010, changed to 2 weeks on 3 weeks of in May 2012, Revlimid 15 mg 14/35 days and weekly Dex in January 2015, Revlimid 15 mg change to 14/42 days and weekly Dex in June 2017, dexamethasone stopped in November 2017 - Velcade and dexamethasone weekly, 3 weeks on/1 week off started recently -Dr. LMaylon Peppershas called me to treat this patient locally so that she can follow-up with him periodically.  -She received Zometa monthly starting September 2009, switch to every 3 months in 2012 and lately off of it. - She was started on Velcade 1 mg/m, 3 weeks on 1 week off on 06/01/2018.  Today is her off week.  She has tolerated it very well.  Denies any neuropathy symptoms.  Takes dexamethasone 10 mg on days of Velcade. - Cycle 3-day 1 was started on 07/27/2018.  She is tolerating Velcade very well. - I have reviewed the results of the M spike which was stable at 1.9.  This was 1.93 on 05/20/2018.  Free light chain ratio is 0.2, previously 0.25.  Lambda light chains were previously 203.  They have come down to 130. -Since her M spike is not improving, I have recommended adding low-dose Revlimid.  We have added Revlimid 10 mg on a 2 weeks on 2 weeks of basis.  She is continuing Velcade on a 3 weeks on 1 week off basis.  She will also continue dexamethasone 10 mg on days of Velcade. -She started her cycle 1 of Revlimid on 08/14/2018.  She tolerated it without any side  effects.  She started cycle 2 on 09/11/2018.  Did not experience any diarrhea.  She had some dryness of the skin which improved with moisturizing lotion.  Denies any tingling or numbness next remedies.  Continues to struggle with memory problems. - SPEP and free light chains from today are pending.  Her creatinine has improved from 1.5-1.1 since the start of Revlimid.  I will see her back in 4 weeks for follow-up.  She will receive flu vaccine today.  2.  Remote history of CNS MALT like  lymphoma, treated with chemotherapy and radiation, last brain MRI was in 2013, CT of the brain in 2018 with no evidence of recurrence.  She does have some dementia from it.  She will continue Aricept 10 mg daily.      Orders placed this encounter:  Orders Placed This Encounter  Procedures  . CBC with Differential/Platelet  . Comprehensive metabolic panel  . Lactate dehydrogenase  . Protein electrophoresis, serum  . Kappa/lambda light chains      Derek Jack, [place] [phone]

## 2018-09-15 LAB — PROTEIN ELECTROPHORESIS, SERUM
A/G Ratio: 0.6 — ABNORMAL LOW (ref 0.7–1.7)
ALPHA-1-GLOBULIN: 0.2 g/dL (ref 0.0–0.4)
ALPHA-2-GLOBULIN: 0.8 g/dL (ref 0.4–1.0)
Albumin ELP: 2.9 g/dL (ref 2.9–4.4)
Beta Globulin: 0.9 g/dL (ref 0.7–1.3)
Gamma Globulin: 2.6 g/dL — ABNORMAL HIGH (ref 0.4–1.8)
Globulin, Total: 4.5 g/dL — ABNORMAL HIGH (ref 2.2–3.9)
M-SPIKE, %: 1.9 g/dL — AB
Total Protein ELP: 7.4 g/dL (ref 6.0–8.5)

## 2018-09-15 LAB — KAPPA/LAMBDA LIGHT CHAINS
Kappa free light chain: 37.5 mg/L — ABNORMAL HIGH (ref 3.3–19.4)
Kappa, lambda light chain ratio: 0.29 (ref 0.26–1.65)
LAMDA FREE LIGHT CHAINS: 131.2 mg/L — AB (ref 5.7–26.3)

## 2018-09-16 MED ORDER — FULVESTRANT 250 MG/5ML IM SOLN
INTRAMUSCULAR | Status: AC
  Filled 2018-09-16: qty 5

## 2018-09-20 ENCOUNTER — Other Ambulatory Visit (HOSPITAL_COMMUNITY): Payer: Self-pay | Admitting: *Deleted

## 2018-09-20 ENCOUNTER — Other Ambulatory Visit (HOSPITAL_COMMUNITY): Payer: Self-pay

## 2018-09-20 DIAGNOSIS — C9000 Multiple myeloma not having achieved remission: Secondary | ICD-10-CM

## 2018-09-21 ENCOUNTER — Encounter (HOSPITAL_COMMUNITY): Payer: Self-pay

## 2018-09-21 ENCOUNTER — Other Ambulatory Visit: Payer: Self-pay

## 2018-09-21 ENCOUNTER — Inpatient Hospital Stay (HOSPITAL_COMMUNITY): Payer: Medicare PPO

## 2018-09-21 ENCOUNTER — Other Ambulatory Visit (HOSPITAL_COMMUNITY): Payer: Self-pay | Admitting: *Deleted

## 2018-09-21 VITALS — BP 121/65 | HR 80 | Temp 98.6°F | Resp 18 | Wt 134.6 lb

## 2018-09-21 DIAGNOSIS — C9000 Multiple myeloma not having achieved remission: Secondary | ICD-10-CM

## 2018-09-21 DIAGNOSIS — Z8572 Personal history of non-Hodgkin lymphomas: Secondary | ICD-10-CM | POA: Diagnosis not present

## 2018-09-21 DIAGNOSIS — Z23 Encounter for immunization: Secondary | ICD-10-CM | POA: Diagnosis not present

## 2018-09-21 DIAGNOSIS — E039 Hypothyroidism, unspecified: Secondary | ICD-10-CM | POA: Diagnosis not present

## 2018-09-21 DIAGNOSIS — Z5112 Encounter for antineoplastic immunotherapy: Secondary | ICD-10-CM | POA: Diagnosis not present

## 2018-09-21 LAB — CBC WITH DIFFERENTIAL/PLATELET
BASOS ABS: 0.1 10*3/uL (ref 0.0–0.1)
Basophils Relative: 1 %
EOS ABS: 0.4 10*3/uL (ref 0.0–0.5)
EOS PCT: 7 %
HCT: 30.5 % — ABNORMAL LOW (ref 36.0–46.0)
HEMOGLOBIN: 9.7 g/dL — AB (ref 12.0–15.0)
LYMPHS ABS: 0.9 10*3/uL (ref 0.7–4.0)
LYMPHS PCT: 17 %
MCH: 29.9 pg (ref 26.0–34.0)
MCHC: 31.8 g/dL (ref 30.0–36.0)
MCV: 94.1 fL (ref 80.0–100.0)
Monocytes Absolute: 0.3 10*3/uL (ref 0.1–1.0)
Monocytes Relative: 5 %
NEUTROS PCT: 70 %
Neutro Abs: 3.7 10*3/uL (ref 1.7–7.7)
Platelets: 135 10*3/uL — ABNORMAL LOW (ref 150–400)
RBC: 3.24 MIL/uL — AB (ref 3.87–5.11)
RDW: 17.8 % — AB (ref 11.5–15.5)
WBC: 5.2 10*3/uL (ref 4.0–10.5)
nRBC: 0 % (ref 0.0–0.2)

## 2018-09-21 LAB — COMPREHENSIVE METABOLIC PANEL
ALBUMIN: 3 g/dL — AB (ref 3.5–5.0)
ALT: 15 U/L (ref 0–44)
ANION GAP: 7 (ref 5–15)
AST: 30 U/L (ref 15–41)
Alkaline Phosphatase: 40 U/L (ref 38–126)
BUN: 18 mg/dL (ref 8–23)
CHLORIDE: 107 mmol/L (ref 98–111)
CO2: 24 mmol/L (ref 22–32)
Calcium: 8.5 mg/dL — ABNORMAL LOW (ref 8.9–10.3)
Creatinine, Ser: 1.17 mg/dL — ABNORMAL HIGH (ref 0.44–1.00)
GFR calc Af Amer: 52 mL/min — ABNORMAL LOW (ref >60)
GFR calc non Af Amer: 45 mL/min — ABNORMAL LOW (ref >60)
GLUCOSE: 174 mg/dL — AB (ref 70–99)
POTASSIUM: 3.2 mmol/L — AB (ref 3.5–5.1)
Sodium: 138 mmol/L (ref 135–145)
TOTAL PROTEIN: 8 g/dL (ref 6.5–8.1)
Total Bilirubin: 0.4 mg/dL (ref 0.3–1.2)

## 2018-09-21 MED ORDER — BORTEZOMIB CHEMO SQ INJECTION 3.5 MG (2.5MG/ML)
1.0000 mg/m2 | Freq: Once | INTRAMUSCULAR | Status: AC
  Administered 2018-09-21: 1.75 mg via SUBCUTANEOUS
  Filled 2018-09-21: qty 0.7

## 2018-09-21 MED ORDER — PROCHLORPERAZINE MALEATE 10 MG PO TABS: 10.0000 mg | ORAL_TABLET | Freq: Once | ORAL | Status: DC

## 2018-09-21 MED ORDER — DONEPEZIL HCL 10 MG PO TABS: 10.0000 mg | ORAL_TABLET | Freq: Every evening | ORAL | 3 refills | Status: DC

## 2018-09-21 MED ORDER — DEXAMETHASONE 4 MG PO TABS
10.0000 mg | ORAL_TABLET | Freq: Once | ORAL | Status: AC
  Administered 2018-09-21: 10 mg via ORAL
  Filled 2018-09-21: qty 2.5

## 2018-09-21 NOTE — Telephone Encounter (Signed)
Okay to refill aricept per Francene Finders, NP

## 2018-09-21 NOTE — Progress Notes (Signed)
Amanda Wu presents today for injection per the provider's orders.  Velcade administration without incident; see MAR for injection details.  Patient tolerated procedure well and without incident.  No questions or complaints noted at this time. Discharged ambulatory in c/o sister.

## 2018-09-27 ENCOUNTER — Other Ambulatory Visit (HOSPITAL_COMMUNITY): Payer: Self-pay

## 2018-09-27 DIAGNOSIS — C9000 Multiple myeloma not having achieved remission: Secondary | ICD-10-CM

## 2018-09-28 ENCOUNTER — Encounter (HOSPITAL_COMMUNITY): Payer: Self-pay

## 2018-09-28 ENCOUNTER — Other Ambulatory Visit: Payer: Self-pay

## 2018-09-28 ENCOUNTER — Inpatient Hospital Stay (HOSPITAL_COMMUNITY): Payer: Medicare PPO

## 2018-09-28 VITALS — BP 129/76 | HR 63 | Temp 98.5°F | Ht 69.0 in | Wt 133.8 lb

## 2018-09-28 DIAGNOSIS — Z5112 Encounter for antineoplastic immunotherapy: Secondary | ICD-10-CM | POA: Diagnosis not present

## 2018-09-28 DIAGNOSIS — C9000 Multiple myeloma not having achieved remission: Secondary | ICD-10-CM

## 2018-09-28 DIAGNOSIS — E039 Hypothyroidism, unspecified: Secondary | ICD-10-CM | POA: Diagnosis not present

## 2018-09-28 DIAGNOSIS — Z8572 Personal history of non-Hodgkin lymphomas: Secondary | ICD-10-CM | POA: Diagnosis not present

## 2018-09-28 DIAGNOSIS — Z23 Encounter for immunization: Secondary | ICD-10-CM | POA: Diagnosis not present

## 2018-09-28 LAB — CBC WITH DIFFERENTIAL/PLATELET
ABS IMMATURE GRANULOCYTES: 0.01 10*3/uL (ref 0.00–0.07)
Basophils Absolute: 0 10*3/uL (ref 0.0–0.1)
Basophils Relative: 0 %
Eosinophils Absolute: 0.4 10*3/uL (ref 0.0–0.5)
Eosinophils Relative: 8 %
HCT: 30.6 % — ABNORMAL LOW (ref 36.0–46.0)
HEMOGLOBIN: 9.5 g/dL — AB (ref 12.0–15.0)
Immature Granulocytes: 0 %
LYMPHS PCT: 23 %
Lymphs Abs: 1.1 10*3/uL (ref 0.7–4.0)
MCH: 29.7 pg (ref 26.0–34.0)
MCHC: 31 g/dL (ref 30.0–36.0)
MCV: 95.6 fL (ref 80.0–100.0)
MONO ABS: 0.5 10*3/uL (ref 0.1–1.0)
Monocytes Relative: 11 %
NEUTROS ABS: 2.8 10*3/uL (ref 1.7–7.7)
Neutrophils Relative %: 58 %
Platelets: 88 10*3/uL — ABNORMAL LOW (ref 150–400)
RBC: 3.2 MIL/uL — AB (ref 3.87–5.11)
RDW: 18 % — ABNORMAL HIGH (ref 11.5–15.5)
WBC: 4.8 10*3/uL (ref 4.0–10.5)
nRBC: 0 % (ref 0.0–0.2)

## 2018-09-28 LAB — COMPREHENSIVE METABOLIC PANEL
ALT: 16 U/L (ref 0–44)
AST: 26 U/L (ref 15–41)
Albumin: 3 g/dL — ABNORMAL LOW (ref 3.5–5.0)
Alkaline Phosphatase: 37 U/L — ABNORMAL LOW (ref 38–126)
Anion gap: 6 (ref 5–15)
BILIRUBIN TOTAL: 0.7 mg/dL (ref 0.3–1.2)
BUN: 18 mg/dL (ref 8–23)
CO2: 25 mmol/L (ref 22–32)
Calcium: 8.8 mg/dL — ABNORMAL LOW (ref 8.9–10.3)
Chloride: 107 mmol/L (ref 98–111)
Creatinine, Ser: 1.29 mg/dL — ABNORMAL HIGH (ref 0.44–1.00)
GFR, EST AFRICAN AMERICAN: 46 mL/min — AB (ref >60)
GFR, EST NON AFRICAN AMERICAN: 40 mL/min — AB (ref >60)
Glucose, Bld: 107 mg/dL — ABNORMAL HIGH (ref 70–99)
POTASSIUM: 3.8 mmol/L (ref 3.5–5.1)
Sodium: 138 mmol/L (ref 135–145)
TOTAL PROTEIN: 8.3 g/dL — AB (ref 6.5–8.1)

## 2018-09-28 MED ORDER — PROCHLORPERAZINE MALEATE 10 MG PO TABS
10.0000 mg | ORAL_TABLET | Freq: Once | ORAL | Status: AC
  Administered 2018-09-28: 10 mg via ORAL

## 2018-09-28 MED ORDER — BORTEZOMIB CHEMO SQ INJECTION 3.5 MG (2.5MG/ML)
1.0000 mg/m2 | Freq: Once | INTRAMUSCULAR | Status: AC
  Administered 2018-09-28: 1.75 mg via SUBCUTANEOUS
  Filled 2018-09-28: qty 0.7

## 2018-09-28 MED ORDER — HEPARIN SOD (PORK) LOCK FLUSH 100 UNIT/ML IV SOLN
INTRAVENOUS | Status: AC
  Filled 2018-09-28: qty 5

## 2018-09-28 MED ORDER — PROCHLORPERAZINE MALEATE 10 MG PO TABS
ORAL_TABLET | ORAL | Status: AC
  Filled 2018-09-28: qty 1

## 2018-09-28 MED ORDER — DEXAMETHASONE 4 MG PO TABS
10.0000 mg | ORAL_TABLET | Freq: Once | ORAL | Status: AC
  Administered 2018-09-28: 10 mg via ORAL
  Filled 2018-09-28: qty 2.5

## 2018-09-28 NOTE — Progress Notes (Signed)
Pt presents to the [facility] for chemo injection Velcade. VSS. No complaints at this time. MAR reviewed.   Labs reviewed with Dr. Delton Coombes. Platelets 88. VO to proceed with treatment received.   Treatment given today per MD orders. Tolerated infusion without adverse affects. Vital signs stable. No complaints at this time. Discharged from clinic ambulatory. F/U with [facility] as scheduled.

## 2018-09-28 NOTE — Patient Instructions (Signed)
[facility] Discharge Instructions for Patients Receiving Chemotherapy  Today you received the following chemotherapy agents   To help prevent nausea and vomiting after your treatment, we encourage you to take your nausea medication   If you develop nausea and vomiting that is not controlled by your nausea medication, call the clinic.   BELOW ARE SYMPTOMS THAT SHOULD BE REPORTED IMMEDIATELY:  *FEVER GREATER THAN 100.5 F  *CHILLS WITH OR WITHOUT FEVER  NAUSEA AND VOMITING THAT IS NOT CONTROLLED WITH YOUR NAUSEA MEDICATION  *UNUSUAL SHORTNESS OF BREATH  *UNUSUAL BRUISING OR BLEEDING  TENDERNESS IN MOUTH AND THROAT WITH OR WITHOUT PRESENCE OF ULCERS  *URINARY PROBLEMS  *BOWEL PROBLEMS  UNUSUAL RASH Items with * indicate a potential emergency and should be followed up as soon as possible.  Feel free to call the clinic should you have any questions or concerns. The clinic phone number is [phone].  Please show the CHEMO ALERT CARD at check-in to the Emergency Department and triage nurse.   

## 2018-09-29 ENCOUNTER — Other Ambulatory Visit (HOSPITAL_COMMUNITY): Payer: Self-pay

## 2018-09-29 DIAGNOSIS — C9000 Multiple myeloma not having achieved remission: Secondary | ICD-10-CM

## 2018-09-29 MED ORDER — LENALIDOMIDE 10 MG PO CAPS: ORAL_CAPSULE | ORAL | 1 refills | Status: DC

## 2018-09-30 DIAGNOSIS — E1165 Type 2 diabetes mellitus with hyperglycemia: Secondary | ICD-10-CM | POA: Diagnosis not present

## 2018-09-30 DIAGNOSIS — Z681 Body mass index (BMI) 19 or less, adult: Secondary | ICD-10-CM | POA: Diagnosis not present

## 2018-09-30 DIAGNOSIS — C9000 Multiple myeloma not having achieved remission: Secondary | ICD-10-CM | POA: Diagnosis not present

## 2018-09-30 DIAGNOSIS — I10 Essential (primary) hypertension: Secondary | ICD-10-CM | POA: Diagnosis not present

## 2018-09-30 DIAGNOSIS — C8589 Other specified types of non-Hodgkin lymphoma, extranodal and solid organ sites: Secondary | ICD-10-CM | POA: Diagnosis not present

## 2018-09-30 DIAGNOSIS — Z299 Encounter for prophylactic measures, unspecified: Secondary | ICD-10-CM | POA: Diagnosis not present

## 2018-10-04 ENCOUNTER — Other Ambulatory Visit (HOSPITAL_COMMUNITY): Payer: Self-pay

## 2018-10-04 DIAGNOSIS — C9000 Multiple myeloma not having achieved remission: Secondary | ICD-10-CM

## 2018-10-05 ENCOUNTER — Inpatient Hospital Stay (HOSPITAL_COMMUNITY): Payer: Medicare PPO

## 2018-10-05 ENCOUNTER — Encounter (HOSPITAL_COMMUNITY): Payer: Self-pay

## 2018-10-05 VITALS — BP 135/77 | HR 58 | Temp 98.4°F | Resp 16 | Wt 136.2 lb

## 2018-10-05 DIAGNOSIS — C9000 Multiple myeloma not having achieved remission: Secondary | ICD-10-CM

## 2018-10-05 DIAGNOSIS — Z23 Encounter for immunization: Secondary | ICD-10-CM | POA: Diagnosis not present

## 2018-10-05 DIAGNOSIS — Z5112 Encounter for antineoplastic immunotherapy: Secondary | ICD-10-CM | POA: Diagnosis not present

## 2018-10-05 DIAGNOSIS — E039 Hypothyroidism, unspecified: Secondary | ICD-10-CM | POA: Diagnosis not present

## 2018-10-05 DIAGNOSIS — Z8572 Personal history of non-Hodgkin lymphomas: Secondary | ICD-10-CM | POA: Diagnosis not present

## 2018-10-05 LAB — COMPREHENSIVE METABOLIC PANEL
ALT: 14 U/L (ref 0–44)
ANION GAP: 5 (ref 5–15)
AST: 27 U/L (ref 15–41)
Albumin: 3 g/dL — ABNORMAL LOW (ref 3.5–5.0)
Alkaline Phosphatase: 33 U/L — ABNORMAL LOW (ref 38–126)
BILIRUBIN TOTAL: 0.7 mg/dL (ref 0.3–1.2)
BUN: 22 mg/dL (ref 8–23)
CALCIUM: 9.1 mg/dL (ref 8.9–10.3)
CO2: 27 mmol/L (ref 22–32)
Chloride: 107 mmol/L (ref 98–111)
Creatinine, Ser: 1.35 mg/dL — ABNORMAL HIGH (ref 0.44–1.00)
GFR calc non Af Amer: 38 mL/min — ABNORMAL LOW (ref >60)
GFR, EST AFRICAN AMERICAN: 44 mL/min — AB (ref >60)
Glucose, Bld: 96 mg/dL (ref 70–99)
Potassium: 4.4 mmol/L (ref 3.5–5.1)
Sodium: 139 mmol/L (ref 135–145)
TOTAL PROTEIN: 7.9 g/dL (ref 6.5–8.1)

## 2018-10-05 LAB — CBC WITH DIFFERENTIAL/PLATELET
Abs Immature Granulocytes: 0.01 10*3/uL (ref 0.00–0.07)
Basophils Absolute: 0 10*3/uL (ref 0.0–0.1)
Basophils Relative: 1 %
EOS PCT: 4 %
Eosinophils Absolute: 0.2 10*3/uL (ref 0.0–0.5)
HEMATOCRIT: 30.1 % — AB (ref 36.0–46.0)
Hemoglobin: 9.4 g/dL — ABNORMAL LOW (ref 12.0–15.0)
Immature Granulocytes: 0 %
LYMPHS ABS: 0.9 10*3/uL (ref 0.7–4.0)
LYMPHS PCT: 22 %
MCH: 29.7 pg (ref 26.0–34.0)
MCHC: 31.2 g/dL (ref 30.0–36.0)
MCV: 95 fL (ref 80.0–100.0)
MONO ABS: 0.6 10*3/uL (ref 0.1–1.0)
Monocytes Relative: 16 %
Neutro Abs: 2.3 10*3/uL (ref 1.7–7.7)
Neutrophils Relative %: 57 %
Platelets: 97 10*3/uL — ABNORMAL LOW (ref 150–400)
RBC: 3.17 MIL/uL — ABNORMAL LOW (ref 3.87–5.11)
RDW: 18.4 % — ABNORMAL HIGH (ref 11.5–15.5)
WBC: 4.1 10*3/uL (ref 4.0–10.5)
nRBC: 0 % (ref 0.0–0.2)

## 2018-10-05 MED ORDER — BORTEZOMIB CHEMO SQ INJECTION 3.5 MG (2.5MG/ML)
1.0000 mg/m2 | Freq: Once | INTRAMUSCULAR | Status: AC
  Administered 2018-10-05: 1.75 mg via SUBCUTANEOUS
  Filled 2018-10-05: qty 0.7

## 2018-10-05 MED ORDER — PROCHLORPERAZINE MALEATE 10 MG PO TABS: 10.0000 mg | ORAL_TABLET | Freq: Once | ORAL | Status: DC

## 2018-10-05 MED ORDER — DEXAMETHASONE 4 MG PO TABS
10.0000 mg | ORAL_TABLET | Freq: Once | ORAL | Status: AC
  Administered 2018-10-05: 10 mg via ORAL
  Filled 2018-10-05: qty 2.5

## 2018-10-05 NOTE — Progress Notes (Signed)
1345 Labs including platelets of 97 reviewed with Dr. Walden Field and pt approved for Velcade injection today per MD                                                            [facility] tolerated Velcade injection well without complaints or incident. VSS Pt discharged self ambulatory in satisfactory condition accompanied by family member

## 2018-10-05 NOTE — Patient Instructions (Signed)
[facility] Discharge Instructions for Patients Receiving Chemotherapy   Beginning January 23rd 2017 lab work for the Cancer Center will be done in the  Main lab at [place] on 1st floor. If you have a lab appointment with the Cancer Center please come in thru the  Main Entrance and check in at the main information desk   Today you received the following chemotherapy agents Velcade injection. Follow-up as scheduled. Call clinic for any questions or concerns  To help prevent nausea and vomiting after your treatment, we encourage you to take your nausea medication   If you develop nausea and vomiting, or diarrhea that is not controlled by your medication, call the clinic.  The clinic phone number is [phone]. Office hours are Monday-Friday 8:30am-5:00pm.  BELOW ARE SYMPTOMS THAT SHOULD BE REPORTED IMMEDIATELY:  *FEVER GREATER THAN 101.0 F  *CHILLS WITH OR WITHOUT FEVER  NAUSEA AND VOMITING THAT IS NOT CONTROLLED WITH YOUR NAUSEA MEDICATION  *UNUSUAL SHORTNESS OF BREATH  *UNUSUAL BRUISING OR BLEEDING  TENDERNESS IN MOUTH AND THROAT WITH OR WITHOUT PRESENCE OF ULCERS  *URINARY PROBLEMS  *BOWEL PROBLEMS  UNUSUAL RASH Items with * indicate a potential emergency and should be followed up as soon as possible. If you have an emergency after office hours please contact your primary care physician or go to the nearest emergency department.  Please call the clinic during office hours if you have any questions or concerns.   You may also contact the Patient Navigator at [phone] should you have any questions or need assistance in obtaining follow up care.      Resources For Cancer Patients and their Caregivers ? American Cancer Society: Can assist with transportation, wigs, general needs, runs Look Good Feel Better.        1-888-227-6333 ? Cancer Care: Provides financial assistance, online support groups, medication/co-pay assistance.   1-800-813-HOPE (4673) ? [facility] Assists [place] cancer patients and their families through emotional , educational and financial support.  336-427-4357 ? [place] DSS Where to apply for food stamps, Medicaid and utility assistance. 336-342-1394 ? [facility]: Transportation to medical appointments. 336-347-2287 ? Social Security Administration: May apply for disability if have a Stage IV cancer. 336-342-7796 1-800-772-1213 ? [place] Aging, Disability and Transit Services: Assists with nutrition, care and transit needs. 336-349-2343         

## 2018-10-06 MED ORDER — CYANOCOBALAMIN 1000 MCG/ML IJ SOLN
INTRAMUSCULAR | Status: AC
  Filled 2018-10-06: qty 1

## 2018-10-07 MED ORDER — HEPARIN SOD (PORK) LOCK FLUSH 100 UNIT/ML IV SOLN
INTRAVENOUS | Status: AC
  Filled 2018-10-07: qty 5

## 2018-10-07 MED ORDER — LIDOCAINE HCL (PF) 1 % IJ SOLN
INTRAMUSCULAR | Status: AC
  Filled 2018-10-07: qty 5

## 2018-10-13 ENCOUNTER — Encounter (HOSPITAL_COMMUNITY): Payer: Self-pay | Admitting: Hematology

## 2018-10-13 ENCOUNTER — Inpatient Hospital Stay (HOSPITAL_COMMUNITY): Payer: Medicare PPO

## 2018-10-13 ENCOUNTER — Inpatient Hospital Stay (HOSPITAL_BASED_OUTPATIENT_CLINIC_OR_DEPARTMENT_OTHER): Payer: Medicare PPO | Admitting: Hematology

## 2018-10-13 ENCOUNTER — Other Ambulatory Visit: Payer: Self-pay

## 2018-10-13 VITALS — BP 116/46 | HR 60 | Temp 98.2°F | Resp 18 | Wt 132.4 lb

## 2018-10-13 DIAGNOSIS — C9000 Multiple myeloma not having achieved remission: Secondary | ICD-10-CM | POA: Diagnosis not present

## 2018-10-13 DIAGNOSIS — Z23 Encounter for immunization: Secondary | ICD-10-CM | POA: Diagnosis not present

## 2018-10-13 DIAGNOSIS — Z8572 Personal history of non-Hodgkin lymphomas: Secondary | ICD-10-CM

## 2018-10-13 DIAGNOSIS — E039 Hypothyroidism, unspecified: Secondary | ICD-10-CM | POA: Diagnosis not present

## 2018-10-13 DIAGNOSIS — Z5112 Encounter for antineoplastic immunotherapy: Secondary | ICD-10-CM | POA: Diagnosis not present

## 2018-10-13 LAB — COMPREHENSIVE METABOLIC PANEL
ALBUMIN: 3 g/dL — AB (ref 3.5–5.0)
ALT: 16 U/L (ref 0–44)
ANION GAP: 7 (ref 5–15)
AST: 29 U/L (ref 15–41)
Alkaline Phosphatase: 34 U/L — ABNORMAL LOW (ref 38–126)
BILIRUBIN TOTAL: 0.5 mg/dL (ref 0.3–1.2)
BUN: 18 mg/dL (ref 8–23)
CO2: 26 mmol/L (ref 22–32)
Calcium: 8.9 mg/dL (ref 8.9–10.3)
Chloride: 106 mmol/L (ref 98–111)
Creatinine, Ser: 1.31 mg/dL — ABNORMAL HIGH (ref 0.44–1.00)
GFR calc Af Amer: 45 mL/min — ABNORMAL LOW (ref >60)
GFR calc non Af Amer: 39 mL/min — ABNORMAL LOW (ref >60)
GLUCOSE: 145 mg/dL — AB (ref 70–99)
POTASSIUM: 3.8 mmol/L (ref 3.5–5.1)
SODIUM: 139 mmol/L (ref 135–145)
TOTAL PROTEIN: 7.8 g/dL (ref 6.5–8.1)

## 2018-10-13 LAB — CBC WITH DIFFERENTIAL/PLATELET
Abs Immature Granulocytes: 0 10*3/uL (ref 0.00–0.07)
BASOS ABS: 0 10*3/uL (ref 0.0–0.1)
BASOS PCT: 1 %
EOS ABS: 0.2 10*3/uL (ref 0.0–0.5)
EOS PCT: 5 %
HEMATOCRIT: 30.9 % — AB (ref 36.0–46.0)
Hemoglobin: 9.6 g/dL — ABNORMAL LOW (ref 12.0–15.0)
Immature Granulocytes: 0 %
Lymphocytes Relative: 18 %
Lymphs Abs: 0.7 10*3/uL (ref 0.7–4.0)
MCH: 29.7 pg (ref 26.0–34.0)
MCHC: 31.1 g/dL (ref 30.0–36.0)
MCV: 95.7 fL (ref 80.0–100.0)
Monocytes Absolute: 0.2 10*3/uL (ref 0.1–1.0)
Monocytes Relative: 4 %
NRBC: 0 % (ref 0.0–0.2)
Neutro Abs: 2.8 10*3/uL (ref 1.7–7.7)
Neutrophils Relative %: 72 %
PLATELETS: 126 10*3/uL — AB (ref 150–400)
RBC: 3.23 MIL/uL — AB (ref 3.87–5.11)
RDW: 18.6 % — AB (ref 11.5–15.5)
WBC: 3.8 10*3/uL — ABNORMAL LOW (ref 4.0–10.5)

## 2018-10-13 LAB — LACTATE DEHYDROGENASE: LDH: 159 U/L (ref 98–192)

## 2018-10-13 NOTE — Assessment & Plan Note (Signed)
1.  IgG lambda multiple myeloma: - Revlimid 18m and dexamethasone 3 weeks on 1 week off started in September 2009, changed to 2 weeks on 2 weeks off in November 2010, changed to 2 weeks on 3 weeks of in May 2012, Revlimid 15 mg 14/35 days and weekly Dex in January 2015, Revlimid 15 mg change to 14/42 days and weekly Dex in June 2017, dexamethasone stopped in November 2017 - Velcade and dexamethasone weekly, 3 weeks on/1 week off started recently -Dr. LMaylon Peppershas called me to treat this patient locally so that she can follow-up with him periodically.  -She received Zometa monthly starting September 2009, switch to every 3 months in 2012 and lately off of it. - She was started on Velcade 1 mg/m, 3 weeks on 1 week off on 06/01/2018.  Today is her off week.  She has tolerated it very well.  Denies any neuropathy symptoms.  Takes dexamethasone 10 mg on days of Velcade. - Cycle 3-day 1 was started on 07/27/2018.  She is tolerating Velcade very well. - I have reviewed the results of the M spike which was stable at 1.9.  This was 1.93 on 05/20/2018.  Free light chain ratio is 0.2, previously 0.25.  Lambda light chains were previously 203.  They have come down to 130. -Since her M spike is not improving, I have recommended adding low-dose Revlimid.  We have added Revlimid 10 mg on a 2 weeks on 2 weeks of basis.  She is continuing Velcade on a 3 weeks on 1 week off basis.  She will also continue dexamethasone 10 mg on days of Velcade. -She started her cycle 1 of Revlimid on 08/14/2018. - We discussed the results of myeloma panel from 09/14/2018 which showed stable M spike of 1.9.  Free light chain ratio was 0.29.  She is tolerating Revlimid and Velcade very well. -She may proceed with her next cycle next week.  I will see her back in 5 weeks for follow-up.  2.  Remote history of CNS MALT like lymphoma, treated with chemotherapy and radiation, last brain MRI was in 2013, CT of the brain in 2018 with no evidence of  recurrence.  She does have some dementia from it.  She will continue Aricept 10 mg daily.

## 2018-10-13 NOTE — Patient Instructions (Signed)
[facility] Discharge Instructions   Follow up in 5 weeks with labs and treatment  Thank you for choosing [facility] at [facility] to provide your oncology and hematology care.  To afford each patient quality time with our provider, please arrive at least 15 minutes before your scheduled appointment time.   If you have a lab appointment with the Cancer Center please come in thru the  Main Entrance and check in at the main information desk  You need to re-schedule your appointment should you arrive 10 or more minutes late.  We strive to give you quality time with our providers, and arriving late affects you and other patients whose appointments are after yours.  Also, if you no show three or more times for appointments you may be dismissed from the clinic at the providers discretion.     Again, thank you for choosing [facility].  Our hope is that these requests will decrease the amount of time that you wait before being seen by our physicians.       _____________________________________________________________  Should you have questions after your visit to [facility], please contact our office at [phone] between the hours of 8:00 a.m. and 4:30 p.m.  Voicemails left after 4:00 p.m. will not be returned until the following business day.  For prescription refill requests, have your pharmacy contact our office and allow 72 hours.    Cancer Center Support Programs:   > Cancer Support Group  2nd Tuesday of the month 1pm-2pm, Journey Room    

## 2018-10-13 NOTE — Progress Notes (Signed)
Amanda Wu, [place] [id number]   CLINIC:  Medical Oncology/Hematology  PCP:  Monico Blitz, MD [place] [address] [phone]   REASON FOR VISIT: Follow-up for multiple myeloma   CURRENT THERAPY: Velcade, Revlimid, and dexamethasone   BRIEF ONCOLOGIC HISTORY:    Multiple myeloma ([facility])   07/09/2017 Initial Diagnosis    Multiple myeloma ([facility])    05/31/2018 -  Chemotherapy    The patient had bortezomib SQ (VELCADE) chemo injection 1.75 mg, 1 mg/m2 = 1.75 mg (100 % of original dose 1 mg/m2), Subcutaneous,  Once, 5 of 6 cycles Dose modification: 1 mg/m2 (original dose 1 mg/m2, Cycle 1, Reason: Provider Judgment) Administration: 1.75 mg (06/01/2018), 1.75 mg (06/08/2018), 1.75 mg (06/15/2018), 1.75 mg (06/29/2018), 1.75 mg (07/06/2018), 1.75 mg (07/13/2018), 1.75 mg (07/27/2018), 1.75 mg (08/03/2018), 1.75 mg (08/10/2018), 1.75 mg (08/24/2018), 1.75 mg (08/31/2018), 1.75 mg (09/07/2018), 1.75 mg (09/21/2018), 1.75 mg (09/28/2018), 1.75 mg (10/05/2018)  for chemotherapy treatment.       INTERVAL HISTORY:  Amanda Wu 72 y.o. female returns for routine follow-up for multiple myeloma. Patient is here today with her sister. She is doing well and tolerating treatment well. She has felt good and and is trying to remain active at home. Her appetite has been decreased however she is making herself eat something 3 times a day. Her sister cooks for her and helps her with daily activities. Her energy level is 75%. She denies any new pains. Denies any nausea, vomiting, or diarrhea. Denies any bleeding.    REVIEW OF SYSTEMS:  Review of Systems  Constitutional: Positive for fatigue.  All other systems reviewed and are negative.    PAST MEDICAL/SURGICAL HISTORY:  Past Medical History:  Diagnosis Date  . Benign hypertension   . Cataract   . 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 Diabetes mellitus without complication ([facility])   . H/O partial nephrectomy   .  Hypokalemia   . Hypothyroidism   . Impaired cognition   . Multiple myeloma (HCC)    Dr Maylon Peppers, [facility]  . Thyroid disease    Past Surgical History:  Procedure Laterality Date  . ABDOMINAL HYSTERECTOMY    . CRANIOTOMY     for lymphoma  . YAG LASER APPLICATION Right 12/17/7251   Procedure: YAG LASER APPLICATION;  Surgeon: [name], MD;  Location: [facility];  Service: Ophthalmology;  Laterality: Right;     SOCIAL HISTORY:  Social History   Socioeconomic History  . Marital status: Single    Spouse name: Not on file  . Number of children: Not on file  . Years of education: Not on file  . Highest education level: Not on file  Occupational History  . Not on file  Social Needs  . Financial resource strain: Not on file  . Food insecurity:    Worry: Not on file    Inability: Not on file  . Transportation needs:    Medical: Not on file    Non-medical: Not on file  Tobacco Use  . Smoking status: Never Smoker  . Smokeless tobacco: Never Used  Substance and Sexual Activity  . Alcohol use: No  . Drug use: No  . Sexual activity: Not on file  Lifestyle  . Physical activity:    Days per week: Not on file    Minutes per session: Not on file  . Stress: Not on file  Relationships  . Social connections:    Talks on  phone: Not on file    Gets together: Not on file    Attends religious service: Not on file    Active member of club or organization: Not on file    Attends meetings of clubs or organizations: Not on file    Relationship status: Not on file  . Intimate partner violence:    Fear of current or ex partner: Not on file    Emotionally abused: Not on file    Physically abused: Not on file    Forced sexual activity: Not on file  Other Topics Concern  . Not on file  Social History Narrative  . Not on file    FAMILY HISTORY:  Family History  Problem Relation Age of Onset  . Depression Mother   . Diabetes Mother   . Heart disease Father   . Cancer - Prostate  Brother   . Cancer Brother   . Cancer Sister     CURRENT MEDICATIONS:  Outpatient Encounter Medications as of 10/13/2018  Medication Sig Note  . ACCU-CHEK SMARTVIEW test strip    . acetaminophen (TYLENOL) 500 MG tablet Take 1,000 mg by mouth every 6 (six) hours as needed for mild pain.    Marland Kitchen acyclovir (ZOVIRAX) 400 MG tablet Take 400 mg by mouth 2 (two) times daily.   Marland Kitchen acyclovir (ZOVIRAX) 400 MG tablet TAKE ONE TABLET BY MOUTH TWICE DAILY   . amLODipine (NORVASC) 5 MG tablet Take 5 mg by mouth daily.    Marland Kitchen aspirin EC 81 MG tablet Take 81 mg by mouth daily.   . bortezomib IV (VELCADE) 3.5 MG injection Inject into the vein.   . calcium-vitamin D (OSCAL WITH D) 500-200 MG-UNIT tablet Take 1 tablet by mouth daily with breakfast.   . dexamethasone (DECADRON) 4 MG tablet TAKE TWO AND ONE-HALF TABLETS BY MOUTH WEEKLY FOR THREE WEEKS (THREE WEEKS ON, ONE WEEK OFF) IN A ROW AND REPEAT MONTHLY 06/22/2018: Patient states that she is given the dexamethasone while here getting her velcade injection  . donepezil (ARICEPT) 10 MG tablet Take 1 tablet (10 mg total) by mouth every evening.   . ferrous sulfate 325 (65 FE) MG tablet Take 325 mg by mouth daily with breakfast.   . lenalidomide (REVLIMID) 10 MG capsule Take 1 tablet (10 mg) daily by mouth for 14 days then stop for 14 days.   Marland Kitchen levothyroxine (SYNTHROID, LEVOTHROID) 25 MCG tablet Take 25 mcg by mouth daily before breakfast.   . losartan (COZAAR) 100 MG tablet Take 100 mg by mouth daily.   . metFORMIN (GLUCOPHAGE) 500 MG tablet Take 250 mg by mouth every other day.   . Multiple Vitamins-Minerals (MULTIVITAMIN WOMEN 50+ PO) Take by mouth.   . potassium chloride (K-DUR,KLOR-CON) 10 MEQ tablet Take 30 mEq by mouth daily.    No facility-administered encounter medications on file as of 10/13/2018.     ALLERGIES:  Allergies  Allergen Reactions  . Lotensin [Benazepril Hcl]     Facial swelling (Angioedema) Note: ARBs should also be contraindicated, but  she is on Losartan!     PHYSICAL EXAM:  ECOG Performance status: 1  Vitals:   10/13/18 1112  BP: (!) 116/46  Pulse: 60  Resp: 18  Temp: 98.2 F (36.8 C)  SpO2: 100%   Filed Weights   10/13/18 1112  Weight: 132 lb 6.4 oz (60.1 kg)    Physical Exam  Constitutional: She is oriented to person, place, and time. She appears well-developed and well-nourished.  Musculoskeletal: Normal  range of motion.  Neurological: She is alert and oriented to person, place, and time.  Skin: Skin is warm and dry.  Psychiatric: She has a normal mood and affect. Her behavior is normal. Judgment and thought content normal.     LABORATORY DATA:  I have reviewed the labs as listed.  CBC    Component Value Date/Time   WBC 3.8 (L) 10/13/2018 1006   RBC 3.23 (L) 10/13/2018 1006   HGB 9.6 (L) 10/13/2018 1006   HCT 30.9 (L) 10/13/2018 1006   PLT 126 (L) 10/13/2018 1006   MCV 95.7 10/13/2018 1006   MCH 29.7 10/13/2018 1006   MCHC 31.1 10/13/2018 1006   RDW 18.6 (H) 10/13/2018 1006   LYMPHSABS 0.7 10/13/2018 1006   MONOABS 0.2 10/13/2018 1006   EOSABS 0.2 10/13/2018 1006   BASOSABS 0.0 10/13/2018 1006   CMP Latest Ref Rng & Units 10/13/2018 10/05/2018 09/28/2018  Glucose 70 - 99 mg/dL 145(H) 96 107(H)  BUN 8 - 23 mg/dL '18 22 18  ' Creatinine 0.44 - 1.00 mg/dL 1.31(H) 1.35(H) 1.29(H)  Sodium 135 - 145 mmol/L 139 139 138  Potassium 3.5 - 5.1 mmol/L 3.8 4.4 3.8  Chloride 98 - 111 mmol/L 106 107 107  CO2 22 - 32 mmol/L '26 27 25  ' Calcium 8.9 - 10.3 mg/dL 8.9 9.1 8.8(L)  Total Protein 6.5 - 8.1 g/dL 7.8 7.9 8.3(H)  Total Bilirubin 0.3 - 1.2 mg/dL 0.5 0.7 0.7  Alkaline Phos 38 - 126 U/L 34(L) 33(L) 37(L)  AST 15 - 41 U/L '29 27 26  ' ALT 0 - 44 U/L '16 14 16         ' ASSESSMENT & PLAN:   Multiple myeloma (HCC) 1.  IgG lambda multiple myeloma: - Revlimid 38m and dexamethasone 3 weeks on 1 week off started in September 2009, changed to 2 weeks on 2 weeks off in November 2010, changed to 2  weeks on 3 weeks of in May 2012, Revlimid 15 mg 14/35 days and weekly Dex in January 2015, Revlimid 15 mg change to 14/42 days and weekly Dex in June 2017, dexamethasone stopped in November 2017 - Velcade and dexamethasone weekly, 3 weeks on/1 week off started recently -Dr. LMaylon Peppershas called me to treat this patient locally so that she can follow-up with him periodically.  -She received Zometa monthly starting September 2009, switch to every 3 months in 2012 and lately off of it. - She was started on Velcade 1 mg/m, 3 weeks on 1 week off on 06/01/2018.  Today is her off week.  She has tolerated it very well.  Denies any neuropathy symptoms.  Takes dexamethasone 10 mg on days of Velcade. - Cycle 3-day 1 was started on 07/27/2018.  She is tolerating Velcade very well. - I have reviewed the results of the M spike which was stable at 1.9.  This was 1.93 on 05/20/2018.  Free light chain ratio is 0.2, previously 0.25.  Lambda light chains were previously 203.  They have come down to 130. -Since her M spike is not improving, I have recommended adding low-dose Revlimid.  We have added Revlimid 10 mg on a 2 weeks on 2 weeks of basis.  She is continuing Velcade on a 3 weeks on 1 week off basis.  She will also continue dexamethasone 10 mg on days of Velcade. -She started her cycle 1 of Revlimid on 08/14/2018. - We discussed the results of myeloma panel from 09/14/2018 which showed stable M spike of 1.9.  Free light chain ratio was 0.29.  She is tolerating Revlimid and Velcade very well. -She may proceed with her next cycle next week.  I will see her back in 5 weeks for follow-up.  2.  Remote history of CNS MALT like lymphoma, treated with chemotherapy and radiation, last brain MRI was in 2013, CT of the brain in 2018 with no evidence of recurrence.  She does have some dementia from it.  She will continue Aricept 10 mg daily.      Orders placed this encounter:  Orders Placed This Encounter  Procedures  . Protein  electrophoresis, serum  . Kappa/lambda light chains  . Lactate dehydrogenase  . CBC with Differential/Platelet  . Comprehensive metabolic panel  . CBC with Differential/Platelet  . Comprehensive metabolic panel      Derek Jack, MD [place] [phone]

## 2018-10-14 LAB — KAPPA/LAMBDA LIGHT CHAINS
KAPPA, LAMDA LIGHT CHAIN RATIO: 0.43 (ref 0.26–1.65)
Kappa free light chain: 40 mg/L — ABNORMAL HIGH (ref 3.3–19.4)
LAMDA FREE LIGHT CHAINS: 93.6 mg/L — AB (ref 5.7–26.3)

## 2018-10-14 LAB — PROTEIN ELECTROPHORESIS, SERUM
A/G RATIO SPE: 0.7 (ref 0.7–1.7)
ALBUMIN ELP: 2.9 g/dL (ref 2.9–4.4)
ALPHA-1-GLOBULIN: 0.2 g/dL (ref 0.0–0.4)
Alpha-2-Globulin: 0.9 g/dL (ref 0.4–1.0)
BETA GLOBULIN: 0.7 g/dL (ref 0.7–1.3)
GLOBULIN, TOTAL: 4.4 g/dL — AB (ref 2.2–3.9)
Gamma Globulin: 2.5 g/dL — ABNORMAL HIGH (ref 0.4–1.8)
M-Spike, %: 2 g/dL — ABNORMAL HIGH
Total Protein ELP: 7.3 g/dL (ref 6.0–8.5)

## 2018-10-14 MED ORDER — INFLUENZA VAC SPLIT HIGH-DOSE 0.5 ML IM SUSY
PREFILLED_SYRINGE | INTRAMUSCULAR | Status: AC
  Filled 2018-10-14: qty 0.5

## 2018-10-14 MED ORDER — FULVESTRANT 250 MG/5ML IM SOLN
INTRAMUSCULAR | Status: AC
  Filled 2018-10-14: qty 5

## 2018-10-18 ENCOUNTER — Other Ambulatory Visit (HOSPITAL_COMMUNITY): Payer: Self-pay | Admitting: Hematology

## 2018-10-19 ENCOUNTER — Inpatient Hospital Stay (HOSPITAL_COMMUNITY): Payer: Medicare PPO

## 2018-10-19 ENCOUNTER — Inpatient Hospital Stay (HOSPITAL_COMMUNITY): Payer: Medicare PPO | Attending: Hematology

## 2018-10-19 VITALS — BP 110/65 | HR 71 | Temp 98.2°F | Resp 18 | Wt 133.6 lb

## 2018-10-19 DIAGNOSIS — C9000 Multiple myeloma not having achieved remission: Secondary | ICD-10-CM

## 2018-10-19 DIAGNOSIS — Z5112 Encounter for antineoplastic immunotherapy: Secondary | ICD-10-CM | POA: Insufficient documentation

## 2018-10-19 LAB — CBC WITH DIFFERENTIAL/PLATELET
Abs Immature Granulocytes: 0.02 10*3/uL (ref 0.00–0.07)
BASOS PCT: 1 %
Basophils Absolute: 0 10*3/uL (ref 0.0–0.1)
EOS PCT: 5 %
Eosinophils Absolute: 0.3 10*3/uL (ref 0.0–0.5)
HCT: 29.5 % — ABNORMAL LOW (ref 36.0–46.0)
HEMOGLOBIN: 9.2 g/dL — AB (ref 12.0–15.0)
Immature Granulocytes: 0 %
Lymphocytes Relative: 16 %
Lymphs Abs: 1 10*3/uL (ref 0.7–4.0)
MCH: 29.5 pg (ref 26.0–34.0)
MCHC: 31.2 g/dL (ref 30.0–36.0)
MCV: 94.6 fL (ref 80.0–100.0)
MONO ABS: 0.4 10*3/uL (ref 0.1–1.0)
MONOS PCT: 6 %
NEUTROS PCT: 72 %
Neutro Abs: 4.5 10*3/uL (ref 1.7–7.7)
Platelets: 161 10*3/uL (ref 150–400)
RBC: 3.12 MIL/uL — ABNORMAL LOW (ref 3.87–5.11)
RDW: 18.2 % — ABNORMAL HIGH (ref 11.5–15.5)
WBC: 6.3 10*3/uL (ref 4.0–10.5)
nRBC: 0 % (ref 0.0–0.2)

## 2018-10-19 LAB — COMPREHENSIVE METABOLIC PANEL
ALK PHOS: 37 U/L — AB (ref 38–126)
ALT: 18 U/L (ref 0–44)
AST: 32 U/L (ref 15–41)
Albumin: 3 g/dL — ABNORMAL LOW (ref 3.5–5.0)
Anion gap: 6 (ref 5–15)
BUN: 17 mg/dL (ref 8–23)
CALCIUM: 8.8 mg/dL — AB (ref 8.9–10.3)
CO2: 25 mmol/L (ref 22–32)
CREATININE: 1.2 mg/dL — AB (ref 0.44–1.00)
Chloride: 109 mmol/L (ref 98–111)
GFR, EST AFRICAN AMERICAN: 50 mL/min — AB (ref >60)
GFR, EST NON AFRICAN AMERICAN: 43 mL/min — AB (ref >60)
Glucose, Bld: 104 mg/dL — ABNORMAL HIGH (ref 70–99)
Potassium: 3.8 mmol/L (ref 3.5–5.1)
Sodium: 140 mmol/L (ref 135–145)
TOTAL PROTEIN: 7.8 g/dL (ref 6.5–8.1)
Total Bilirubin: 0.5 mg/dL (ref 0.3–1.2)

## 2018-10-19 MED ORDER — PROCHLORPERAZINE MALEATE 10 MG PO TABS: 10.0000 mg | ORAL_TABLET | Freq: Once | ORAL | Status: DC

## 2018-10-19 MED ORDER — DEXAMETHASONE 4 MG PO TABS
10.0000 mg | ORAL_TABLET | Freq: Once | ORAL | Status: AC
  Administered 2018-10-19: 10 mg via ORAL
  Filled 2018-10-19: qty 2.5

## 2018-10-19 MED ORDER — BORTEZOMIB CHEMO SQ INJECTION 3.5 MG (2.5MG/ML)
1.0000 mg/m2 | Freq: Once | INTRAMUSCULAR | Status: AC
  Administered 2018-10-19: 1.75 mg via SUBCUTANEOUS
  Filled 2018-10-19: qty 0.7

## 2018-10-19 MED ORDER — HEPARIN SOD (PORK) LOCK FLUSH 100 UNIT/ML IV SOLN
INTRAVENOUS | Status: AC
  Filled 2018-10-19: qty 5

## 2018-10-19 NOTE — Progress Notes (Signed)
Amanda Wu tolerated Velcade injection without incident or complaint. VSS. Discharged self ambulatory in satisfactory condition in presence of family member.

## 2018-10-19 NOTE — Patient Instructions (Signed)
[facility] Discharge Instructions for Patients Receiving Chemotherapy   Beginning January 23rd 2017 lab work for the Cancer Center will be done in the  Main lab at [place] on 1st floor. If you have a lab appointment with the Cancer Center please come in thru the  Main Entrance and check in at the main information desk   Today you received the following chemotherapy agents Velcade  To help prevent nausea and vomiting after your treatment, we encourage you to take your nausea medication    If you develop nausea and vomiting, or diarrhea that is not controlled by your medication, call the clinic.  The clinic phone number is [phone]. Office hours are Monday-Friday 8:30am-5:00pm.  BELOW ARE SYMPTOMS THAT SHOULD BE REPORTED IMMEDIATELY:  *FEVER GREATER THAN 101.0 F  *CHILLS WITH OR WITHOUT FEVER  NAUSEA AND VOMITING THAT IS NOT CONTROLLED WITH YOUR NAUSEA MEDICATION  *UNUSUAL SHORTNESS OF BREATH  *UNUSUAL BRUISING OR BLEEDING  TENDERNESS IN MOUTH AND THROAT WITH OR WITHOUT PRESENCE OF ULCERS  *URINARY PROBLEMS  *BOWEL PROBLEMS  UNUSUAL RASH Items with * indicate a potential emergency and should be followed up as soon as possible. If you have an emergency after office hours please contact your primary care physician or go to the nearest emergency department.  Please call the clinic during office hours if you have any questions or concerns.   You may also contact the Patient Navigator at [phone] should you have any questions or need assistance in obtaining follow up care.      Resources For Cancer Patients and their Caregivers ? American Cancer Society: Can assist with transportation, wigs, general needs, runs Look Good Feel Better.        1-888-227-6333 ? Cancer Care: Provides financial assistance, online support groups, medication/co-pay assistance.  1-800-813-HOPE (4673) ? [facility] Assists [place] cancer  patients and their families through emotional , educational and financial support.  336-427-4357 ? [place] DSS Where to apply for food stamps, Medicaid and utility assistance. 336-342-1394 ? [facility]: Transportation to medical appointments. 336-347-2287 ? Social Security Administration: May apply for disability if have a Stage IV cancer. 336-342-7796 1-800-772-1213 ? [place] Aging, Disability and Transit Services: Assists with nutrition, care and transit needs. 336-349-2343          

## 2018-10-21 MED ORDER — HEPARIN SOD (PORK) LOCK FLUSH 100 UNIT/ML IV SOLN
INTRAVENOUS | Status: AC
  Filled 2018-10-21: qty 5

## 2018-10-21 MED ORDER — CYANOCOBALAMIN 1000 MCG/ML IJ SOLN
INTRAMUSCULAR | Status: AC
  Filled 2018-10-21: qty 1

## 2018-10-21 MED ORDER — ACETAMINOPHEN 325 MG PO TABS
ORAL_TABLET | ORAL | Status: AC
  Filled 2018-10-21: qty 2

## 2018-10-26 ENCOUNTER — Inpatient Hospital Stay (HOSPITAL_COMMUNITY): Payer: Medicare PPO

## 2018-10-26 ENCOUNTER — Encounter (HOSPITAL_COMMUNITY): Payer: Self-pay

## 2018-10-26 ENCOUNTER — Other Ambulatory Visit: Payer: Self-pay

## 2018-10-26 VITALS — BP 109/59 | HR 73 | Temp 98.0°F | Resp 16 | Wt 136.8 lb

## 2018-10-26 DIAGNOSIS — Z5112 Encounter for antineoplastic immunotherapy: Secondary | ICD-10-CM | POA: Diagnosis not present

## 2018-10-26 DIAGNOSIS — C9000 Multiple myeloma not having achieved remission: Secondary | ICD-10-CM

## 2018-10-26 LAB — COMPREHENSIVE METABOLIC PANEL
ALT: 27 U/L (ref 0–44)
AST: 35 U/L (ref 15–41)
Albumin: 2.8 g/dL — ABNORMAL LOW (ref 3.5–5.0)
Alkaline Phosphatase: 50 U/L (ref 38–126)
Anion gap: 4 — ABNORMAL LOW (ref 5–15)
BUN: 13 mg/dL (ref 8–23)
CALCIUM: 8.8 mg/dL — AB (ref 8.9–10.3)
CHLORIDE: 109 mmol/L (ref 98–111)
CO2: 26 mmol/L (ref 22–32)
CREATININE: 1.19 mg/dL — AB (ref 0.44–1.00)
GFR calc Af Amer: 51 mL/min — ABNORMAL LOW (ref >60)
GFR, EST NON AFRICAN AMERICAN: 44 mL/min — AB (ref >60)
Glucose, Bld: 102 mg/dL — ABNORMAL HIGH (ref 70–99)
Potassium: 3.8 mmol/L (ref 3.5–5.1)
SODIUM: 139 mmol/L (ref 135–145)
Total Bilirubin: 0.4 mg/dL (ref 0.3–1.2)
Total Protein: 8.2 g/dL — ABNORMAL HIGH (ref 6.5–8.1)

## 2018-10-26 LAB — CBC WITH DIFFERENTIAL/PLATELET
ABS IMMATURE GRANULOCYTES: 0.01 10*3/uL (ref 0.00–0.07)
Basophils Absolute: 0 10*3/uL (ref 0.0–0.1)
Basophils Relative: 1 %
Eosinophils Absolute: 0.2 10*3/uL (ref 0.0–0.5)
Eosinophils Relative: 6 %
HCT: 29.2 % — ABNORMAL LOW (ref 36.0–46.0)
HEMOGLOBIN: 9.3 g/dL — AB (ref 12.0–15.0)
Immature Granulocytes: 0 %
LYMPHS PCT: 23 %
Lymphs Abs: 0.9 10*3/uL (ref 0.7–4.0)
MCH: 30.6 pg (ref 26.0–34.0)
MCHC: 31.8 g/dL (ref 30.0–36.0)
MCV: 96.1 fL (ref 80.0–100.0)
MONO ABS: 0.5 10*3/uL (ref 0.1–1.0)
Monocytes Relative: 13 %
NEUTROS ABS: 2.3 10*3/uL (ref 1.7–7.7)
Neutrophils Relative %: 57 %
PLATELETS: 78 10*3/uL — AB (ref 150–400)
RBC: 3.04 MIL/uL — ABNORMAL LOW (ref 3.87–5.11)
RDW: 18.3 % — ABNORMAL HIGH (ref 11.5–15.5)
WBC: 3.9 10*3/uL — ABNORMAL LOW (ref 4.0–10.5)
nRBC: 0 % (ref 0.0–0.2)

## 2018-10-26 MED ORDER — PROCHLORPERAZINE MALEATE 10 MG PO TABS
ORAL_TABLET | ORAL | Status: AC
  Filled 2018-10-26: qty 1

## 2018-10-26 MED ORDER — DIPHENHYDRAMINE HCL 25 MG PO CAPS
ORAL_CAPSULE | ORAL | Status: AC
  Filled 2018-10-26: qty 1

## 2018-10-26 MED ORDER — DEXAMETHASONE 4 MG PO TABS
10.0000 mg | ORAL_TABLET | Freq: Once | ORAL | Status: AC
  Administered 2018-10-26: 10 mg via ORAL
  Filled 2018-10-26: qty 2.5

## 2018-10-26 MED ORDER — PROCHLORPERAZINE MALEATE 10 MG PO TABS: 10.0000 mg | ORAL_TABLET | Freq: Once | ORAL | Status: DC

## 2018-10-26 MED ORDER — BORTEZOMIB CHEMO SQ INJECTION 3.5 MG (2.5MG/ML)
1.0000 mg/m2 | Freq: Once | INTRAMUSCULAR | Status: AC
  Administered 2018-10-26: 1.75 mg via SUBCUTANEOUS
  Filled 2018-10-26: qty 0.7

## 2018-10-26 MED ORDER — ACETAMINOPHEN 325 MG PO TABS
ORAL_TABLET | ORAL | Status: AC
  Filled 2018-10-26: qty 2

## 2018-10-26 MED ORDER — HEPARIN SOD (PORK) LOCK FLUSH 100 UNIT/ML IV SOLN
INTRAVENOUS | Status: AC
  Filled 2018-10-26: qty 5

## 2018-10-26 NOTE — Progress Notes (Signed)
Labs reviewed with Dr. Delton Coombes today. Platelets noted at 78,000, proceed with treatment today per MD.  Treatment given per orders. Patient tolerated it well without problems. Vitals stable and discharged home from clinic ambulatory. Follow up as scheduled.

## 2018-10-26 NOTE — Patient Instructions (Signed)
[facility] Discharge Instructions for Patients Receiving Chemotherapy   Beginning January 23rd 2017 lab work for the Cancer Center will be done in the  Main lab at [facility] [place] on 1st floor. If you have a lab appointment with the Cancer Center please come in thru the  Main Entrance and check in at the main information desk   Today you received the following chemotherapy agents   To help prevent nausea and vomiting after your treatment, we encourage you to take your nausea medication     If you develop nausea and vomiting, or diarrhea that is not controlled by your medication, call the clinic.  The clinic phone number is [phone]. Office hours are Monday-Friday 8:30am-5:00pm.  BELOW ARE SYMPTOMS THAT SHOULD BE REPORTED IMMEDIATELY:  *FEVER GREATER THAN 101.0 F  *CHILLS WITH OR WITHOUT FEVER  NAUSEA AND VOMITING THAT IS NOT CONTROLLED WITH YOUR NAUSEA MEDICATION  *UNUSUAL SHORTNESS OF BREATH  *UNUSUAL BRUISING OR BLEEDING  TENDERNESS IN MOUTH AND THROAT WITH OR WITHOUT PRESENCE OF ULCERS  *URINARY PROBLEMS  *BOWEL PROBLEMS  UNUSUAL RASH Items with * indicate a potential emergency and should be followed up as soon as possible. If you have an emergency after office hours please contact your primary care physician or go to the nearest emergency department.  Please call the clinic during office hours if you have any questions or concerns.   You may also contact the Patient Navigator at [phone] should you have any questions or need assistance in obtaining follow up care.      Resources For Cancer Patients and their Caregivers ? American Cancer Society: Can assist with transportation, wigs, general needs, runs Look Good Feel Better.        1-888-227-6333 ? Cancer Care: Provides financial assistance, online support groups, medication/co-pay assistance.  1-800-813-HOPE (4673) ? [facility] Assists [place] cancer  patients and their families through emotional , educational and financial support.  336-427-4357 ? [place] DSS Where to apply for food stamps, Medicaid and utility assistance. 336-342-1394 ? [facility]: Transportation to medical appointments. 336-347-2287 ? Social Security Administration: May apply for disability if have a Stage IV cancer. 336-342-7796 1-800-772-1213 ? [place] [facility]ging, Disability and Transit Services: Assists with nutrition, care and transit needs. [phone]         

## 2018-10-27 MED ORDER — HEPARIN SOD (PORK) LOCK FLUSH 100 UNIT/ML IV SOLN
INTRAVENOUS | Status: AC
  Filled 2018-10-27: qty 5

## 2018-10-28 MED ORDER — PROCHLORPERAZINE MALEATE 10 MG PO TABS
ORAL_TABLET | ORAL | Status: AC
  Filled 2018-10-28: qty 1

## 2018-11-01 ENCOUNTER — Other Ambulatory Visit (HOSPITAL_COMMUNITY): Payer: Self-pay | Admitting: Nurse Practitioner

## 2018-11-01 ENCOUNTER — Other Ambulatory Visit (HOSPITAL_COMMUNITY): Payer: Self-pay | Admitting: *Deleted

## 2018-11-01 DIAGNOSIS — C9000 Multiple myeloma not having achieved remission: Secondary | ICD-10-CM

## 2018-11-01 MED ORDER — LENALIDOMIDE 10 MG PO CAPS: ORAL_CAPSULE | ORAL | 1 refills | Status: DC

## 2018-11-01 NOTE — Telephone Encounter (Signed)
Chart reviewed, revlimid refilled. 

## 2018-11-02 ENCOUNTER — Inpatient Hospital Stay (HOSPITAL_COMMUNITY): Payer: Medicare PPO

## 2018-11-02 ENCOUNTER — Encounter (HOSPITAL_COMMUNITY): Payer: Self-pay

## 2018-11-02 VITALS — BP 117/61 | HR 64 | Temp 98.2°F | Resp 16 | Wt 134.4 lb

## 2018-11-02 DIAGNOSIS — Z5112 Encounter for antineoplastic immunotherapy: Secondary | ICD-10-CM | POA: Diagnosis not present

## 2018-11-02 DIAGNOSIS — C9000 Multiple myeloma not having achieved remission: Secondary | ICD-10-CM

## 2018-11-02 LAB — COMPREHENSIVE METABOLIC PANEL
ALT: 15 U/L (ref 0–44)
ANION GAP: 5 (ref 5–15)
AST: 27 U/L (ref 15–41)
Albumin: 2.9 g/dL — ABNORMAL LOW (ref 3.5–5.0)
Alkaline Phosphatase: 40 U/L (ref 38–126)
BUN: 19 mg/dL (ref 8–23)
CHLORIDE: 106 mmol/L (ref 98–111)
CO2: 26 mmol/L (ref 22–32)
Calcium: 9 mg/dL (ref 8.9–10.3)
Creatinine, Ser: 1.36 mg/dL — ABNORMAL HIGH (ref 0.44–1.00)
GFR calc non Af Amer: 37 mL/min — ABNORMAL LOW (ref >60)
GFR, EST AFRICAN AMERICAN: 43 mL/min — AB (ref >60)
GLUCOSE: 85 mg/dL (ref 70–99)
POTASSIUM: 4 mmol/L (ref 3.5–5.1)
Sodium: 137 mmol/L (ref 135–145)
Total Bilirubin: 0.4 mg/dL (ref 0.3–1.2)
Total Protein: 8 g/dL (ref 6.5–8.1)

## 2018-11-02 LAB — CBC WITH DIFFERENTIAL/PLATELET
ABS IMMATURE GRANULOCYTES: 0.01 10*3/uL (ref 0.00–0.07)
Basophils Absolute: 0 10*3/uL (ref 0.0–0.1)
Basophils Relative: 1 %
EOS ABS: 0.2 10*3/uL (ref 0.0–0.5)
Eosinophils Relative: 4 %
HCT: 29.8 % — ABNORMAL LOW (ref 36.0–46.0)
Hemoglobin: 9.3 g/dL — ABNORMAL LOW (ref 12.0–15.0)
IMMATURE GRANULOCYTES: 0 %
Lymphocytes Relative: 19 %
Lymphs Abs: 0.9 10*3/uL (ref 0.7–4.0)
MCH: 29.8 pg (ref 26.0–34.0)
MCHC: 31.2 g/dL (ref 30.0–36.0)
MCV: 95.5 fL (ref 80.0–100.0)
MONOS PCT: 11 %
Monocytes Absolute: 0.5 10*3/uL (ref 0.1–1.0)
NEUTROS PCT: 65 %
Neutro Abs: 3.2 10*3/uL (ref 1.7–7.7)
Platelets: 133 10*3/uL — ABNORMAL LOW (ref 150–400)
RBC: 3.12 MIL/uL — ABNORMAL LOW (ref 3.87–5.11)
RDW: 19 % — ABNORMAL HIGH (ref 11.5–15.5)
WBC: 4.8 10*3/uL (ref 4.0–10.5)
nRBC: 0 % (ref 0.0–0.2)

## 2018-11-02 MED ORDER — PROCHLORPERAZINE MALEATE 10 MG PO TABS: 10.0000 mg | ORAL_TABLET | Freq: Once | ORAL | Status: DC

## 2018-11-02 MED ORDER — BORTEZOMIB CHEMO SQ INJECTION 3.5 MG (2.5MG/ML)
1.0000 mg/m2 | Freq: Once | INTRAMUSCULAR | Status: AC
  Administered 2018-11-02: 1.75 mg via SUBCUTANEOUS
  Filled 2018-11-02: qty 0.7

## 2018-11-02 MED ORDER — DEXAMETHASONE 4 MG PO TABS
10.0000 mg | ORAL_TABLET | Freq: Once | ORAL | Status: AC
  Administered 2018-11-02: 10 mg via ORAL
  Filled 2018-11-02: qty 2.5

## 2018-11-02 NOTE — Patient Instructions (Signed)
[facility] Discharge Instructions for Patients Receiving Chemotherapy   Beginning January 23rd 2017 lab work for the Cancer Center will be done in the  Main lab at [place] on 1st floor. If you have a lab appointment with the Cancer Center please come in thru the  Main Entrance and check in at the main information desk   Today you received the following chemotherapy agents Velcade injection. Follow-up as scheduled. Call clinic for any questions or concerns  To help prevent nausea and vomiting after your treatment, we encourage you to take your nausea medication   If you develop nausea and vomiting, or diarrhea that is not controlled by your medication, call the clinic.  The clinic phone number is [phone]. Office hours are Monday-Friday 8:30am-5:00pm.  BELOW ARE SYMPTOMS THAT SHOULD BE REPORTED IMMEDIATELY:  *FEVER GREATER THAN 101.0 F  *CHILLS WITH OR WITHOUT FEVER  NAUSEA AND VOMITING THAT IS NOT CONTROLLED WITH YOUR NAUSEA MEDICATION  *UNUSUAL SHORTNESS OF BREATH  *UNUSUAL BRUISING OR BLEEDING  TENDERNESS IN MOUTH AND THROAT WITH OR WITHOUT PRESENCE OF ULCERS  *URINARY PROBLEMS  *BOWEL PROBLEMS  UNUSUAL RASH Items with * indicate a potential emergency and should be followed up as soon as possible. If you have an emergency after office hours please contact your primary care physician or go to the nearest emergency department.  Please call the clinic during office hours if you have any questions or concerns.   You may also contact the Patient Navigator at [phone] should you have any questions or need assistance in obtaining follow up care.      Resources For Cancer Patients and their Caregivers ? American Cancer Society: Can assist with transportation, wigs, general needs, runs Look Good Feel Better.        1-888-227-6333 ? Cancer Care: Provides financial assistance, online support groups, medication/co-pay assistance.   1-800-813-HOPE (4673) ? [facility] Assists [place] cancer patients and their families through emotional , educational and financial support.  336-427-4357 ? [place] DSS Where to apply for food stamps, Medicaid and utility assistance. 336-342-1394 ? [facility]: Transportation to medical appointments. 336-347-2287 ? Social Security Administration: May apply for disability if have a Stage IV cancer. 336-342-7796 1-800-772-1213 ? [place] Aging, Disability and Transit Services: Assists with nutrition, care and transit needs. [phone]         

## 2018-11-02 NOTE — Progress Notes (Signed)
Amanda Wu tolerated Velcade injection well without complaints or incident. Labs reviewed prior to administering this medication. VSS Pt discharged self ambulatory in satisfactory condition accompanied by her sister

## 2018-11-03 MED ORDER — OCTREOTIDE ACETATE 30 MG IM KIT
PACK | INTRAMUSCULAR | Status: AC
  Filled 2018-11-03: qty 1

## 2018-11-04 MED ORDER — EPOETIN ALFA 10000 UNIT/ML IJ SOLN
INTRAMUSCULAR | Status: AC
  Filled 2018-11-04: qty 1

## 2018-11-04 MED ORDER — CYANOCOBALAMIN 1000 MCG/ML IJ SOLN
INTRAMUSCULAR | Status: AC
  Filled 2018-11-04: qty 1

## 2018-11-04 MED ORDER — EPOETIN ALFA 40000 UNIT/ML IJ SOLN
INTRAMUSCULAR | Status: AC
  Filled 2018-11-04: qty 1

## 2018-11-04 MED ORDER — ONDANSETRON HCL 4 MG/2ML IJ SOLN
INTRAMUSCULAR | Status: AC
  Filled 2018-11-04: qty 2

## 2018-11-16 ENCOUNTER — Inpatient Hospital Stay (HOSPITAL_COMMUNITY): Payer: Medicare PPO | Attending: Hematology | Admitting: Hematology

## 2018-11-16 ENCOUNTER — Other Ambulatory Visit: Payer: Self-pay

## 2018-11-16 ENCOUNTER — Inpatient Hospital Stay (HOSPITAL_COMMUNITY): Payer: Medicare PPO

## 2018-11-16 ENCOUNTER — Ambulatory Visit (HOSPITAL_COMMUNITY): Payer: Medicare PPO

## 2018-11-16 ENCOUNTER — Encounter (HOSPITAL_COMMUNITY): Payer: Self-pay | Admitting: Hematology

## 2018-11-16 VITALS — BP 112/60 | HR 70 | Temp 98.7°F | Resp 18 | Wt 138.0 lb

## 2018-11-16 DIAGNOSIS — D649 Anemia, unspecified: Secondary | ICD-10-CM

## 2018-11-16 DIAGNOSIS — E119 Type 2 diabetes mellitus without complications: Secondary | ICD-10-CM | POA: Insufficient documentation

## 2018-11-16 DIAGNOSIS — Z5112 Encounter for antineoplastic immunotherapy: Secondary | ICD-10-CM | POA: Diagnosis not present

## 2018-11-16 DIAGNOSIS — E039 Hypothyroidism, unspecified: Secondary | ICD-10-CM | POA: Insufficient documentation

## 2018-11-16 DIAGNOSIS — C9000 Multiple myeloma not having achieved remission: Secondary | ICD-10-CM

## 2018-11-16 DIAGNOSIS — Z8572 Personal history of non-Hodgkin lymphomas: Secondary | ICD-10-CM | POA: Diagnosis not present

## 2018-11-16 LAB — CBC WITH DIFFERENTIAL/PLATELET
ABS IMMATURE GRANULOCYTES: 0.01 10*3/uL (ref 0.00–0.07)
BASOS PCT: 1 %
Basophils Absolute: 0.1 10*3/uL (ref 0.0–0.1)
EOS ABS: 0.4 10*3/uL (ref 0.0–0.5)
Eosinophils Relative: 6 %
HEMATOCRIT: 29.4 % — AB (ref 36.0–46.0)
Hemoglobin: 9 g/dL — ABNORMAL LOW (ref 12.0–15.0)
IMMATURE GRANULOCYTES: 0 %
LYMPHS ABS: 0.7 10*3/uL (ref 0.7–4.0)
Lymphocytes Relative: 12 %
MCH: 29.5 pg (ref 26.0–34.0)
MCHC: 30.6 g/dL (ref 30.0–36.0)
MCV: 96.4 fL (ref 80.0–100.0)
MONOS PCT: 6 %
Monocytes Absolute: 0.3 10*3/uL (ref 0.1–1.0)
NEUTROS PCT: 75 %
Neutro Abs: 4.2 10*3/uL (ref 1.7–7.7)
PLATELETS: 156 10*3/uL (ref 150–400)
RBC: 3.05 MIL/uL — ABNORMAL LOW (ref 3.87–5.11)
RDW: 19.4 % — AB (ref 11.5–15.5)
WBC: 5.6 10*3/uL (ref 4.0–10.5)
nRBC: 0 % (ref 0.0–0.2)

## 2018-11-16 LAB — COMPREHENSIVE METABOLIC PANEL
ALBUMIN: 3 g/dL — AB (ref 3.5–5.0)
ALT: 18 U/L (ref 0–44)
AST: 34 U/L (ref 15–41)
Alkaline Phosphatase: 46 U/L (ref 38–126)
Anion gap: 6 (ref 5–15)
BUN: 18 mg/dL (ref 8–23)
CHLORIDE: 107 mmol/L (ref 98–111)
CO2: 26 mmol/L (ref 22–32)
CREATININE: 1.14 mg/dL — AB (ref 0.44–1.00)
Calcium: 8.8 mg/dL — ABNORMAL LOW (ref 8.9–10.3)
GFR calc Af Amer: 55 mL/min — ABNORMAL LOW (ref >60)
GFR calc non Af Amer: 47 mL/min — ABNORMAL LOW (ref >60)
GLUCOSE: 143 mg/dL — AB (ref 70–99)
POTASSIUM: 3.7 mmol/L (ref 3.5–5.1)
Sodium: 139 mmol/L (ref 135–145)
Total Bilirubin: 0.4 mg/dL (ref 0.3–1.2)
Total Protein: 8.2 g/dL — ABNORMAL HIGH (ref 6.5–8.1)

## 2018-11-16 LAB — LACTATE DEHYDROGENASE: LDH: 160 U/L (ref 98–192)

## 2018-11-16 MED ORDER — DEXAMETHASONE 4 MG PO TABS
10.0000 mg | ORAL_TABLET | Freq: Once | ORAL | Status: AC
  Administered 2018-11-16: 10 mg via ORAL
  Filled 2018-11-16: qty 2.5

## 2018-11-16 MED ORDER — BORTEZOMIB CHEMO SQ INJECTION 3.5 MG (2.5MG/ML)
1.0000 mg/m2 | Freq: Once | INTRAMUSCULAR | Status: AC
  Administered 2018-11-16: 1.75 mg via SUBCUTANEOUS
  Filled 2018-11-16: qty 0.7

## 2018-11-16 NOTE — Progress Notes (Signed)
Amanda Wu presents today for injection per MD orders. Velcade administered SQ in left Abdomen. Administration without incident. Patient tolerated well.  Patient taking Revlimid as prescribed and states that she has missed no doses.

## 2018-11-16 NOTE — Assessment & Plan Note (Signed)
1.  IgG lambda multiple myeloma: - Revlimid 7m and dexamethasone 3 weeks on 1 week off started in September 2009, changed to 2 weeks on 2 weeks off in November 2010, changed to 2 weeks on 3 weeks of in May 2012, Revlimid 15 mg 14/35 days and weekly Dex in January 2015, Revlimid 15 mg change to 14/42 days and weekly Dex in June 2017, dexamethasone stopped in November 2017 - Velcade and dexamethasone weekly, 3 weeks on/1 week off started recently -Dr. LMaylon Peppershas called me to treat this patient locally so that she can follow-up with him periodically.  -She received Zometa monthly starting September 2009, switch to every 3 months in 2012 and lately off of it. - She was started on Velcade 1 mg/m, 3 weeks on 1 week off on 06/01/2018.  Today is her off week.  She has tolerated it very well.  Denies any neuropathy symptoms.  Takes dexamethasone 10 mg on days of Velcade. - Cycle 3-day 1 was started on 07/27/2018.  She is tolerating Velcade very well. - I have reviewed the results of the M spike which was stable at 1.9.  This was 1.93 on 05/20/2018.  Free light chain ratio is 0.2, previously 0.25.  Lambda light chains were previously 203.  They have come down to 130. -Since her M spike is not improving, I have recommended adding low-dose Revlimid.  We have added Revlimid 10 mg on a 2 weeks on 2 weeks of basis.  She is continuing Velcade on a 3 weeks on 1 week off basis.  She will also continue dexamethasone 10 mg on days of Velcade. -She started her cycle 1 of Revlimid on 08/14/2018. - We reviewed the results of the SPEP from 10/13/2018.  Monoclonal spike went up to 2 g/dL from 1.9.  However lambda light chains have come down to 90.  I have proposed either increasing Revlimid to 3 weeks on 1 week off or changing it to pomalidomide low-dose at 2 mg 3 weeks on 1 week off.  Patient is reluctant to consider any changes at this time.  I think we can wait 1 more month and follow-up on the results from today. -Otherwise she  is tolerating Revlimid without any problems.  She does not have any signs or symptoms of neuropathy at this time.   -she has anemia with a hemoglobin of 9, normocytic.  This is from marrow suppression from Revlimid and Velcade. -I will reevaluate her in 1 month.  2.  Remote history of CNS MALT like lymphoma, treated with chemotherapy and radiation, last brain MRI was in 2013, CT of the brain in 2018 with no evidence of recurrence.  She does have some dementia from it.  She will continue Aricept 10 mg daily.

## 2018-11-16 NOTE — Progress Notes (Signed)
[place] [address], [place] [address]   CLINIC:  Medical Oncology/Hematology  PCP:  Monico Blitz, MD [place] [address] [phone]   REASON FOR VISIT: Follow-up for multiple myeloma   CURRENT THERAPY: Velcade, Revlimid, and dexamethasone   BRIEF ONCOLOGIC HISTORY:    Multiple myeloma ([place])   07/09/2017 Initial Diagnosis    Multiple myeloma ([facility])    05/31/2018 -  Chemotherapy    The patient had bortezomib SQ (VELCADE) chemo injection 1.75 mg, 1 mg/m2 = 1.75 mg (100 % of original dose 1 mg/m2), Subcutaneous,  Once, 7 of 9 cycles Dose modification: 1 mg/m2 (original dose 1 mg/m2, Cycle 1, Reason: Provider Judgment) Administration: 1.75 mg (06/01/2018), 1.75 mg (06/08/2018), 1.75 mg (06/15/2018), 1.75 mg (06/29/2018), 1.75 mg (07/06/2018), 1.75 mg (07/13/2018), 1.75 mg (07/27/2018), 1.75 mg (08/03/2018), 1.75 mg (08/10/2018), 1.75 mg (08/24/2018), 1.75 mg (08/31/2018), 1.75 mg (09/07/2018), 1.75 mg (09/21/2018), 1.75 mg (09/28/2018), 1.75 mg (10/05/2018), 1.75 mg (10/19/2018), 1.75 mg (10/26/2018), 1.75 mg (11/02/2018)  for chemotherapy treatment.       INTERVAL HISTORY:  Amanda Wu 73 y.o. female returns for routine follow-up multiple myeloma. She is here today with her friend. She is doing well with her treatment. She does have mild edema in her lower extremities that decreases at night. She denies any new pains. Denies any nausea, vomiting, or diarrhea. Denies any fevers or recent infections. Denies any bleeding or easy bruising. She reports her appetite and energy level is at 100%.     REVIEW OF SYSTEMS:  Review of Systems  Cardiovascular: Positive for leg swelling.  All other systems reviewed and are negative.    PAST MEDICAL/SURGICAL HISTORY:  Past Medical History:  Diagnosis Date  . Benign hypertension   . Cataract   . 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 Diabetes mellitus without complication ([facility])   . H/O partial nephrectomy   .  Hypokalemia   . Hypothyroidism   . Impaired cognition   . Multiple myeloma (H[facility])    Dr Maylon Peppers, [facility]  . Thyroid disease    Past Surgical History:  Procedure Laterality Date  . ABDOMINAL HYSTERECTOMY    . CRANIOTOMY     for lymphoma  . YAG LASER APPLICATION Right 6/60/6004   Procedure: YAG LASER APPLICATION;  Surgeon: [name], MD;  Location: [facility];  Service: Ophthalmology;  Laterality: Right;     SOCIAL HISTORY:  Social History   Socioeconomic History  . Marital status: Single    Spouse name: Not on file  . Number of children: Not on file  . Years of education: Not on file  . Highest education level: Not on file  Occupational History  . Not on file  Social Needs  . Financial resource strain: Not on file  . Food insecurity:    Worry: Not on file    Inability: Not on file  . Transportation needs:    Medical: Not on file    Non-medical: Not on file  Tobacco Use  . Smoking status: Never Smoker  . Smokeless tobacco: Never Used  Substance and Sexual Activity  . Alcohol use: No  . Drug use: No  . Sexual activity: Not on file  Lifestyle  . Physical activity:    Days per week: Not on file    Minutes per session: Not on file  . Stress: Not on file  Relationships  . Social connections:    Talks on phone: Not on file  Gets together: Not on file    Attends religious service: Not on file    Active member of club or organization: Not on file    Attends meetings of clubs or organizations: Not on file    Relationship status: Not on file  . Intimate partner violence:    Fear of current or ex partner: Not on file    Emotionally abused: Not on file    Physically abused: Not on file    Forced sexual activity: Not on file  Other Topics Concern  . Not on file  Social History Narrative  . Not on file    FAMILY HISTORY:  Family History  Problem Relation Age of Onset  . Depression Mother   . Diabetes Mother   . Heart disease Father   . Cancer - Prostate  Brother   . Cancer Brother   . Cancer Sister     CURRENT MEDICATIONS:  Outpatient Encounter Medications as of 11/16/2018  Medication Sig Note  . ACCU-CHEK SMARTVIEW test strip    . acetaminophen (TYLENOL) 500 MG tablet Take 1,000 mg by mouth every 6 (six) hours as needed for mild pain.    Marland Kitchen acyclovir (ZOVIRAX) 400 MG tablet TAKE ONE TABLET BY MOUTH TWICE DAILY   . amLODipine (NORVASC) 5 MG tablet Take 5 mg by mouth daily.    Marland Kitchen aspirin EC 81 MG tablet Take 81 mg by mouth daily.   . bortezomib IV (VELCADE) 3.5 MG injection Inject into the vein.   . calcium-vitamin D (OSCAL WITH D) 500-200 MG-UNIT tablet Take 1 tablet by mouth daily with breakfast.   . dexamethasone (DECADRON) 4 MG tablet TAKE TWO AND ONE-HALF TABLETS BY MOUTH WEEKLY FOR THREE WEEKS (THREE WEEKS ON, ONE WEEK OFF) IN A ROW AND REPEAT MONTHLY 06/22/2018: Patient states that she is given the dexamethasone while here getting her velcade injection  . donepezil (ARICEPT) 10 MG tablet Take 1 tablet (10 mg total) by mouth every evening.   . ferrous sulfate 325 (65 FE) MG tablet Take 325 mg by mouth daily with breakfast.   . lenalidomide (REVLIMID) 10 MG capsule Take 1 tablet (10 mg) daily by mouth for 14 days then stop for 14 days.   Marland Kitchen levothyroxine (SYNTHROID, LEVOTHROID) 25 MCG tablet Take 25 mcg by mouth daily before breakfast.   . losartan (COZAAR) 100 MG tablet Take 100 mg by mouth daily.   . Multiple Vitamins-Minerals (MULTIVITAMIN WOMEN 50+ PO) Take by mouth.   . potassium chloride (K-DUR,KLOR-CON) 10 MEQ tablet Take 30 mEq by mouth daily.    No facility-administered encounter medications on file as of 11/16/2018.     ALLERGIES:  Allergies  Allergen Reactions  . Lotensin [Benazepril Hcl]     Facial swelling (Angioedema) Note: ARBs should also be contraindicated, but she is on Losartan!     PHYSICAL EXAM:  ECOG Performance status: 1  Vitals:   11/16/18 1036  BP: 112/60  Pulse: 70  Resp: 18  Temp: 98.7 F (37.1 C)    SpO2: 100%   Filed Weights   11/16/18 1036  Weight: 138 lb (62.6 kg)    Physical Exam  Constitutional: She is oriented to person, place, and time. She appears well-developed and well-nourished.  Musculoskeletal: Normal range of motion.  Neurological: She is alert and oriented to person, place, and time.  Skin: Skin is warm and dry.  Psychiatric: She has a normal mood and affect.     LABORATORY DATA:  I have reviewed the  labs as listed.  CBC    Component Value Date/Time   WBC 5.6 11/16/2018 0941   RBC 3.05 (L) 11/16/2018 0941   HGB 9.0 (L) 11/16/2018 0941   HCT 29.4 (L) 11/16/2018 0941   PLT 156 11/16/2018 0941   MCV 96.4 11/16/2018 0941   MCH 29.5 11/16/2018 0941   MCHC 30.6 11/16/2018 0941   RDW 19.4 (H) 11/16/2018 0941   LYMPHSABS 0.7 11/16/2018 0941   MONOABS 0.3 11/16/2018 0941   EOSABS 0.4 11/16/2018 0941   BASOSABS 0.1 11/16/2018 0941   CMP Latest Ref Rng & Units 11/16/2018 11/02/2018 10/26/2018  Glucose 70 - 99 mg/dL 143(H) 85 102(H)  BUN 8 - 23 mg/dL '18 19 13  ' Creatinine 0.44 - 1.00 mg/dL 1.14(H) 1.36(H) 1.19(H)  Sodium 135 - 145 mmol/L 139 137 139  Potassium 3.5 - 5.1 mmol/L 3.7 4.0 3.8  Chloride 98 - 111 mmol/L 107 106 109  CO2 22 - 32 mmol/L '26 26 26  ' Calcium 8.9 - 10.3 mg/dL 8.8(L) 9.0 8.8(L)  Total Protein 6.5 - 8.1 g/dL 8.2(H) 8.0 8.2(H)  Total Bilirubin 0.3 - 1.2 mg/dL 0.4 0.4 0.4  Alkaline Phos 38 - 126 U/L 46 40 50  AST 15 - 41 U/L 34 27 35  ALT 0 - 44 U/L '18 15 27     ' I have reviewed Amanda Finders, NP's note and agree with the documentation.  I personally performed a face-to-face visit, made revisions and my assessment and plan is as follows.      ASSESSMENT & PLAN:   Multiple myeloma (HCC) 1.  IgG lambda multiple myeloma: - Revlimid 43m and dexamethasone 3 weeks on 1 week off started in September 2009, changed to 2 weeks on 2 weeks off in November 2010, changed to 2 weeks on 3 weeks of in May 2012, Revlimid 15 mg 14/35 days and weekly  Dex in January 2015, Revlimid 15 mg change to 14/42 days and weekly Dex in June 2017, dexamethasone stopped in November 2017 - Velcade and dexamethasone weekly, 3 weeks on/1 week off started recently -Dr. LMaylon Peppershas called me to treat this patient locally so that she can follow-up with him periodically.  -She received Zometa monthly starting September 2009, switch to every 3 months in 2012 and lately off of it. - She was started on Velcade 1 mg/m, 3 weeks on 1 week off on 06/01/2018.  Today is her off week.  She has tolerated it very well.  Denies any neuropathy symptoms.  Takes dexamethasone 10 mg on days of Velcade. - Cycle 3-day 1 was started on 07/27/2018.  She is tolerating Velcade very well. - I have reviewed the results of the M spike which was stable at 1.9.  This was 1.93 on 05/20/2018.  Free light chain ratio is 0.2, previously 0.25.  Lambda light chains were previously 203.  They have come down to 130. -Since her M spike is not improving, I have recommended adding low-dose Revlimid.  We have added Revlimid 10 mg on a 2 weeks on 2 weeks of basis.  She is continuing Velcade on a 3 weeks on 1 week off basis.  She will also continue dexamethasone 10 mg on days of Velcade. -She started her cycle 1 of Revlimid on 08/14/2018. - We reviewed the results of the SPEP from 10/13/2018.  Monoclonal spike went up to 2 g/dL from 1.9.  However lambda light chains have come down to 90.  I have proposed either increasing Revlimid to 3 weeks on  1 week off or changing it to pomalidomide low-dose at 2 mg 3 weeks on 1 week off.  Patient is reluctant to consider any changes at this time.  I think we can wait 1 more month and follow-up on the results from today. -Otherwise she is tolerating Revlimid without any problems.  She does not have any signs or symptoms of neuropathy at this time.   -she has anemia with a hemoglobin of 9, normocytic.  This is from marrow suppression from Revlimid and Velcade. -I will reevaluate  her in 1 month.  2.  Remote history of CNS MALT like lymphoma, treated with chemotherapy and radiation, last brain MRI was in 2013, CT of the brain in 2018 with no evidence of recurrence.  She does have some dementia from it.  She will continue Aricept 10 mg daily.      Orders placed this encounter:  Orders Placed This Encounter  Procedures  . CBC with Differential/Platelet  . Comprehensive metabolic panel  . Lactate dehydrogenase  . Protein electrophoresis, serum  . Kappa/lambda light chains      Derek Jack, [place] [phone]

## 2018-11-16 NOTE — Patient Instructions (Addendum)
[place] at [facility]  Discharge Instructions: follow up with [place] in 1 months with labs prior  You saw Dr. Delton Coombes today. _______________________________________________________________  Thank you for choosing [place] at [facility] to provide your oncology and hematology care.  To afford each patient quality time with our providers, please arrive at least 15 minutes before your scheduled appointment.  You need to re-schedule your appointment if you arrive 10 or more minutes late.  We strive to give you quality time with our providers, and arriving late affects you and other patients whose appointments are after yours.  Also, if you no show three or more times for appointments you may be dismissed from the clinic.  Again, thank you for choosing [place] at [place] hope is that these requests will allow you access to exceptional care and in a timely manner. _______________________________________________________________  If you have questions after your visit, please contact our office at [phone] between the hours of 8:30 a.m. and 5:00 p.m. Voicemails left after 4:30 p.m. will not be returned until the following business day. _______________________________________________________________  For prescription refill requests, have your pharmacy contact our office. _______________________________________________________________  Recommendations made by the consultant and any test results will be sent to your referring physician. _______________________________________________________________

## 2018-11-17 LAB — PROTEIN ELECTROPHORESIS, SERUM
A/G Ratio: 0.6 — ABNORMAL LOW (ref 0.7–1.7)
ALBUMIN ELP: 2.9 g/dL (ref 2.9–4.4)
ALPHA-1-GLOBULIN: 0.3 g/dL (ref 0.0–0.4)
Alpha-2-Globulin: 0.9 g/dL (ref 0.4–1.0)
BETA GLOBULIN: 0.8 g/dL (ref 0.7–1.3)
Gamma Globulin: 2.6 g/dL — ABNORMAL HIGH (ref 0.4–1.8)
Globulin, Total: 4.5 g/dL — ABNORMAL HIGH (ref 2.2–3.9)
M-SPIKE, %: 2 g/dL — AB
TOTAL PROTEIN ELP: 7.4 g/dL (ref 6.0–8.5)

## 2018-11-17 LAB — KAPPA/LAMBDA LIGHT CHAINS
Kappa free light chain: 57 mg/L — ABNORMAL HIGH (ref 3.3–19.4)
Kappa, lambda light chain ratio: 0.29 (ref 0.26–1.65)
Lambda free light chains: 200 mg/L — ABNORMAL HIGH (ref 5.7–26.3)

## 2018-11-17 MED ORDER — HEPARIN SOD (PORK) LOCK FLUSH 100 UNIT/ML IV SOLN
INTRAVENOUS | Status: AC
  Filled 2018-11-17: qty 5

## 2018-11-18 MED ORDER — OCTREOTIDE ACETATE 30 MG IM KIT
PACK | INTRAMUSCULAR | Status: AC
  Filled 2018-11-18: qty 1

## 2018-11-18 MED ORDER — HEPARIN SOD (PORK) LOCK FLUSH 100 UNIT/ML IV SOLN
INTRAVENOUS | Status: AC
  Filled 2018-11-18: qty 5

## 2018-11-19 MED ORDER — OCTREOTIDE ACETATE 30 MG IM KIT
PACK | INTRAMUSCULAR | Status: AC
  Filled 2018-11-19: qty 1

## 2018-11-23 ENCOUNTER — Inpatient Hospital Stay (HOSPITAL_COMMUNITY): Payer: Medicare PPO

## 2018-11-23 VITALS — BP 122/72 | HR 64 | Temp 98.1°F | Resp 16 | Wt 139.0 lb

## 2018-11-23 DIAGNOSIS — E119 Type 2 diabetes mellitus without complications: Secondary | ICD-10-CM | POA: Diagnosis not present

## 2018-11-23 DIAGNOSIS — C9000 Multiple myeloma not having achieved remission: Secondary | ICD-10-CM

## 2018-11-23 DIAGNOSIS — Z8572 Personal history of non-Hodgkin lymphomas: Secondary | ICD-10-CM | POA: Diagnosis not present

## 2018-11-23 DIAGNOSIS — Z5112 Encounter for antineoplastic immunotherapy: Secondary | ICD-10-CM | POA: Diagnosis not present

## 2018-11-23 DIAGNOSIS — D649 Anemia, unspecified: Secondary | ICD-10-CM | POA: Diagnosis not present

## 2018-11-23 DIAGNOSIS — E039 Hypothyroidism, unspecified: Secondary | ICD-10-CM | POA: Diagnosis not present

## 2018-11-23 LAB — CBC WITH DIFFERENTIAL/PLATELET
Abs Immature Granulocytes: 0.02 10*3/uL (ref 0.00–0.07)
Basophils Absolute: 0 10*3/uL (ref 0.0–0.1)
Basophils Relative: 1 %
Eosinophils Absolute: 0.5 10*3/uL (ref 0.0–0.5)
Eosinophils Relative: 8 %
HEMATOCRIT: 29.7 % — AB (ref 36.0–46.0)
Hemoglobin: 9.1 g/dL — ABNORMAL LOW (ref 12.0–15.0)
Immature Granulocytes: 0 %
Lymphocytes Relative: 18 %
Lymphs Abs: 1.1 10*3/uL (ref 0.7–4.0)
MCH: 29.4 pg (ref 26.0–34.0)
MCHC: 30.6 g/dL (ref 30.0–36.0)
MCV: 96.1 fL (ref 80.0–100.0)
MONOS PCT: 10 %
Monocytes Absolute: 0.6 10*3/uL (ref 0.1–1.0)
Neutro Abs: 3.6 10*3/uL (ref 1.7–7.7)
Neutrophils Relative %: 63 %
Platelets: 105 10*3/uL — ABNORMAL LOW (ref 150–400)
RBC: 3.09 MIL/uL — ABNORMAL LOW (ref 3.87–5.11)
RDW: 19.6 % — AB (ref 11.5–15.5)
WBC: 5.7 10*3/uL (ref 4.0–10.5)
nRBC: 0 % (ref 0.0–0.2)

## 2018-11-23 LAB — COMPREHENSIVE METABOLIC PANEL
ALT: 23 U/L (ref 0–44)
AST: 32 U/L (ref 15–41)
Albumin: 3 g/dL — ABNORMAL LOW (ref 3.5–5.0)
Alkaline Phosphatase: 46 U/L (ref 38–126)
Anion gap: 5 (ref 5–15)
BILIRUBIN TOTAL: 0.7 mg/dL (ref 0.3–1.2)
BUN: 19 mg/dL (ref 8–23)
CO2: 26 mmol/L (ref 22–32)
Calcium: 9 mg/dL (ref 8.9–10.3)
Chloride: 109 mmol/L (ref 98–111)
Creatinine, Ser: 1.18 mg/dL — ABNORMAL HIGH (ref 0.44–1.00)
GFR calc Af Amer: 53 mL/min — ABNORMAL LOW (ref >60)
GFR calc non Af Amer: 45 mL/min — ABNORMAL LOW (ref >60)
Glucose, Bld: 97 mg/dL (ref 70–99)
Potassium: 3.9 mmol/L (ref 3.5–5.1)
Sodium: 140 mmol/L (ref 135–145)
Total Protein: 8.2 g/dL — ABNORMAL HIGH (ref 6.5–8.1)

## 2018-11-23 MED ORDER — DEXAMETHASONE 4 MG PO TABS
10.0000 mg | ORAL_TABLET | Freq: Once | ORAL | Status: AC
  Administered 2018-11-23: 10 mg via ORAL
  Filled 2018-11-23: qty 2.5

## 2018-11-23 MED ORDER — PROCHLORPERAZINE MALEATE 10 MG PO TABS: 10.0000 mg | ORAL_TABLET | Freq: Once | ORAL | Status: DC

## 2018-11-23 MED ORDER — BORTEZOMIB CHEMO SQ INJECTION 3.5 MG (2.5MG/ML)
1.0000 mg/m2 | Freq: Once | INTRAMUSCULAR | Status: AC
  Administered 2018-11-23: 1.75 mg via SUBCUTANEOUS
  Filled 2018-11-23: qty 0.7

## 2018-11-23 NOTE — Patient Instructions (Signed)
[facility] Discharge Instructions for Patients Receiving Chemotherapy   Beginning January 23rd 2017 lab work for the Cancer Center will be done in the  Main lab at  on 1st floor. If you have a lab appointment with the Cancer Center please come in thru the  Main Entrance and check in at the main information desk   Today you received the following chemotherapy agents Velcade  To help prevent nausea and vomiting after your treatment, we encourage you to take your nausea medication    If you develop nausea and vomiting, or diarrhea that is not controlled by your medication, call the clinic.  The clinic phone number is [phone]. Office hours are Monday-Friday 8:30am-5:00pm.  BELOW ARE SYMPTOMS THAT SHOULD BE REPORTED IMMEDIATELY:  *FEVER GREATER THAN 101.0 F  *CHILLS WITH OR WITHOUT FEVER  NAUSEA AND VOMITING THAT IS NOT CONTROLLED WITH YOUR NAUSEA MEDICATION  *UNUSUAL SHORTNESS OF BREATH  *UNUSUAL BRUISING OR BLEEDING  TENDERNESS IN MOUTH AND THROAT WITH OR WITHOUT PRESENCE OF ULCERS  *URINARY PROBLEMS  *BOWEL PROBLEMS  UNUSUAL RASH Items with * indicate a potential emergency and should be followed up as soon as possible. If you have an emergency after office hours please contact your primary care physician or go to the nearest emergency department.  Please call the clinic during office hours if you have any questions or concerns.   You may also contact the Patient Navigator at [phone] should you have any questions or need assistance in obtaining follow up care.      Resources For Cancer Patients and their Caregivers ? American Cancer Society: Can assist with transportation, wigs, general needs, runs Look Good Feel Better.        1-888-227-6333 ? Cancer Care: Provides financial assistance, online support groups, medication/co-pay assistance.  1-800-813-HOPE (4673) ? [facility] Assists [place] cancer  patients and their families through emotional , educational and financial support.  336-427-4357 ? [place] DSS Where to apply for food stamps, Medicaid and utility assistance. 336-342-1394 ? [facility]: Transportation to medical appointments. 336-347-2287 ? Social Security Administration: May apply for disability if have a Stage IV cancer. 336-342-7796 1-800-772-1213 ? [place] Aging, Disability and Transit Services: Assists with nutrition, care and transit needs. 336-349-2343          

## 2018-11-23 NOTE — Progress Notes (Signed)
Amanda Wu presents today for Velcade injection. Labs reviewed and pt approved for treatment today. Pt tolerated Velcade injection without incident or complaint. VSS. Discharged self ambulatory in satisfactory condition in presence of family member.

## 2018-11-25 MED ORDER — DIPHENHYDRAMINE HCL 25 MG PO CAPS
ORAL_CAPSULE | ORAL | Status: AC
  Filled 2018-11-25: qty 1

## 2018-11-25 MED ORDER — ACETAMINOPHEN 325 MG PO TABS
ORAL_TABLET | ORAL | Status: AC
  Filled 2018-11-25: qty 2

## 2018-11-26 ENCOUNTER — Other Ambulatory Visit (HOSPITAL_COMMUNITY): Payer: Self-pay | Admitting: *Deleted

## 2018-11-26 DIAGNOSIS — C9000 Multiple myeloma not having achieved remission: Secondary | ICD-10-CM

## 2018-11-26 MED ORDER — LENALIDOMIDE 10 MG PO CAPS: ORAL_CAPSULE | ORAL | 1 refills | Status: DC

## 2018-11-26 NOTE — Telephone Encounter (Signed)
Chart reviewed, revlimid refilled. 

## 2018-11-27 MED ORDER — HEPARIN SOD (PORK) LOCK FLUSH 100 UNIT/ML IV SOLN
INTRAVENOUS | Status: AC
  Filled 2018-11-27: qty 5

## 2018-11-29 MED ORDER — OCTREOTIDE ACETATE 30 MG IM KIT
PACK | INTRAMUSCULAR | Status: AC
  Filled 2018-11-29: qty 1

## 2018-11-30 ENCOUNTER — Inpatient Hospital Stay (HOSPITAL_COMMUNITY): Payer: Medicare PPO

## 2018-11-30 ENCOUNTER — Encounter (HOSPITAL_COMMUNITY): Payer: Self-pay

## 2018-11-30 ENCOUNTER — Other Ambulatory Visit: Payer: Self-pay

## 2018-11-30 VITALS — BP 114/53 | HR 69 | Temp 98.6°F | Resp 18 | Wt 134.8 lb

## 2018-11-30 DIAGNOSIS — E119 Type 2 diabetes mellitus without complications: Secondary | ICD-10-CM | POA: Diagnosis not present

## 2018-11-30 DIAGNOSIS — Z8572 Personal history of non-Hodgkin lymphomas: Secondary | ICD-10-CM | POA: Diagnosis not present

## 2018-11-30 DIAGNOSIS — C9000 Multiple myeloma not having achieved remission: Secondary | ICD-10-CM

## 2018-11-30 DIAGNOSIS — E039 Hypothyroidism, unspecified: Secondary | ICD-10-CM | POA: Diagnosis not present

## 2018-11-30 DIAGNOSIS — D649 Anemia, unspecified: Secondary | ICD-10-CM | POA: Diagnosis not present

## 2018-11-30 DIAGNOSIS — Z5112 Encounter for antineoplastic immunotherapy: Secondary | ICD-10-CM | POA: Diagnosis not present

## 2018-11-30 LAB — CBC WITH DIFFERENTIAL/PLATELET
Abs Immature Granulocytes: 0.01 10*3/uL (ref 0.00–0.07)
BASOS PCT: 1 %
Basophils Absolute: 0.1 10*3/uL (ref 0.0–0.1)
EOS ABS: 0.2 10*3/uL (ref 0.0–0.5)
Eosinophils Relative: 4 %
HCT: 30.1 % — ABNORMAL LOW (ref 36.0–46.0)
Hemoglobin: 9.6 g/dL — ABNORMAL LOW (ref 12.0–15.0)
Immature Granulocytes: 0 %
Lymphocytes Relative: 19 %
Lymphs Abs: 0.8 10*3/uL (ref 0.7–4.0)
MCH: 30.4 pg (ref 26.0–34.0)
MCHC: 31.9 g/dL (ref 30.0–36.0)
MCV: 95.3 fL (ref 80.0–100.0)
Monocytes Absolute: 0.6 10*3/uL (ref 0.1–1.0)
Monocytes Relative: 14 %
Neutro Abs: 2.6 10*3/uL (ref 1.7–7.7)
Neutrophils Relative %: 62 %
PLATELETS: 135 10*3/uL — AB (ref 150–400)
RBC: 3.16 MIL/uL — ABNORMAL LOW (ref 3.87–5.11)
RDW: 19 % — ABNORMAL HIGH (ref 11.5–15.5)
WBC: 4.2 10*3/uL (ref 4.0–10.5)
nRBC: 0 % (ref 0.0–0.2)

## 2018-11-30 LAB — COMPREHENSIVE METABOLIC PANEL
ALT: 16 U/L (ref 0–44)
ANION GAP: 5 (ref 5–15)
AST: 28 U/L (ref 15–41)
Albumin: 2.9 g/dL — ABNORMAL LOW (ref 3.5–5.0)
Alkaline Phosphatase: 41 U/L (ref 38–126)
BUN: 20 mg/dL (ref 8–23)
CO2: 26 mmol/L (ref 22–32)
Calcium: 9.1 mg/dL (ref 8.9–10.3)
Chloride: 107 mmol/L (ref 98–111)
Creatinine, Ser: 1.23 mg/dL — ABNORMAL HIGH (ref 0.44–1.00)
GFR calc Af Amer: 50 mL/min — ABNORMAL LOW (ref >60)
GFR calc non Af Amer: 43 mL/min — ABNORMAL LOW (ref >60)
Glucose, Bld: 93 mg/dL (ref 70–99)
Potassium: 4.1 mmol/L (ref 3.5–5.1)
Sodium: 138 mmol/L (ref 135–145)
Total Bilirubin: 0.6 mg/dL (ref 0.3–1.2)
Total Protein: 8 g/dL (ref 6.5–8.1)

## 2018-11-30 MED ORDER — PROCHLORPERAZINE MALEATE 10 MG PO TABS: 10.0000 mg | ORAL_TABLET | Freq: Once | ORAL | Status: DC

## 2018-11-30 MED ORDER — IPRATROPIUM-ALBUTEROL 0.5-2.5 (3) MG/3ML IN SOLN
RESPIRATORY_TRACT | Status: AC
  Filled 2018-11-30: qty 3

## 2018-11-30 MED ORDER — BORTEZOMIB CHEMO SQ INJECTION 3.5 MG (2.5MG/ML)
1.0000 mg/m2 | Freq: Once | INTRAMUSCULAR | Status: AC
  Administered 2018-11-30: 1.75 mg via SUBCUTANEOUS
  Filled 2018-11-30: qty 0.7

## 2018-11-30 MED ORDER — DEXAMETHASONE 4 MG PO TABS
10.0000 mg | ORAL_TABLET | Freq: Once | ORAL | Status: AC
  Administered 2018-11-30: 10 mg via ORAL
  Filled 2018-11-30: qty 2.5

## 2018-11-30 NOTE — Progress Notes (Signed)
Amanda Wu presents today for injection per the provider's orders.  Velcade administration without incident; see MAR for injection details.  Patient tolerated procedure well and without incident.  No questions or complaints noted at this time.  Discharged ambulatory.

## 2018-12-01 MED ORDER — FULVESTRANT 250 MG/5ML IM SOLN
INTRAMUSCULAR | Status: AC
  Filled 2018-12-01: qty 5

## 2018-12-01 MED ORDER — OCTREOTIDE ACETATE 30 MG IM KIT
PACK | INTRAMUSCULAR | Status: AC
  Filled 2018-12-01: qty 1

## 2018-12-02 MED ORDER — METHYLPREDNISOLONE SODIUM SUCC 125 MG IJ SOLR
INTRAMUSCULAR | Status: AC
  Filled 2018-12-02: qty 2

## 2018-12-02 MED ORDER — FUROSEMIDE 10 MG/ML IJ SOLN
INTRAMUSCULAR | Status: AC
  Filled 2018-12-02: qty 2

## 2018-12-02 MED ORDER — IPRATROPIUM-ALBUTEROL 0.5-2.5 (3) MG/3ML IN SOLN
RESPIRATORY_TRACT | Status: AC
  Filled 2018-12-02: qty 3

## 2018-12-16 ENCOUNTER — Encounter (HOSPITAL_COMMUNITY): Payer: Self-pay | Admitting: Hematology

## 2018-12-16 ENCOUNTER — Inpatient Hospital Stay (HOSPITAL_COMMUNITY): Payer: Medicare PPO

## 2018-12-16 ENCOUNTER — Inpatient Hospital Stay (HOSPITAL_COMMUNITY): Payer: Medicare PPO | Attending: Hematology

## 2018-12-16 ENCOUNTER — Inpatient Hospital Stay (HOSPITAL_COMMUNITY): Payer: Medicare PPO | Admitting: Hematology

## 2018-12-16 ENCOUNTER — Other Ambulatory Visit: Payer: Self-pay

## 2018-12-16 VITALS — BP 115/54 | HR 79 | Temp 98.9°F | Resp 16 | Wt 134.0 lb

## 2018-12-16 DIAGNOSIS — Z8572 Personal history of non-Hodgkin lymphomas: Secondary | ICD-10-CM | POA: Insufficient documentation

## 2018-12-16 DIAGNOSIS — Z5112 Encounter for antineoplastic immunotherapy: Secondary | ICD-10-CM | POA: Insufficient documentation

## 2018-12-16 DIAGNOSIS — C9000 Multiple myeloma not having achieved remission: Secondary | ICD-10-CM

## 2018-12-16 LAB — LACTATE DEHYDROGENASE: LDH: 159 U/L (ref 98–192)

## 2018-12-16 LAB — CBC WITH DIFFERENTIAL/PLATELET
Abs Immature Granulocytes: 0.02 10*3/uL (ref 0.00–0.07)
Basophils Absolute: 0 10*3/uL (ref 0.0–0.1)
Basophils Relative: 1 %
Eosinophils Absolute: 0.4 10*3/uL (ref 0.0–0.5)
Eosinophils Relative: 7 %
HCT: 30.2 % — ABNORMAL LOW (ref 36.0–46.0)
Hemoglobin: 9.2 g/dL — ABNORMAL LOW (ref 12.0–15.0)
IMMATURE GRANULOCYTES: 0 %
Lymphocytes Relative: 15 %
Lymphs Abs: 0.8 10*3/uL (ref 0.7–4.0)
MCH: 29 pg (ref 26.0–34.0)
MCHC: 30.5 g/dL (ref 30.0–36.0)
MCV: 95.3 fL (ref 80.0–100.0)
Monocytes Absolute: 0.4 10*3/uL (ref 0.1–1.0)
Monocytes Relative: 8 %
Neutro Abs: 3.9 10*3/uL (ref 1.7–7.7)
Neutrophils Relative %: 69 %
PLATELETS: 162 10*3/uL (ref 150–400)
RBC: 3.17 MIL/uL — ABNORMAL LOW (ref 3.87–5.11)
RDW: 19.3 % — AB (ref 11.5–15.5)
WBC: 5.6 10*3/uL (ref 4.0–10.5)
nRBC: 0 % (ref 0.0–0.2)

## 2018-12-16 LAB — COMPREHENSIVE METABOLIC PANEL
ALT: 22 U/L (ref 0–44)
AST: 32 U/L (ref 15–41)
Albumin: 2.9 g/dL — ABNORMAL LOW (ref 3.5–5.0)
Alkaline Phosphatase: 44 U/L (ref 38–126)
Anion gap: 7 (ref 5–15)
BUN: 21 mg/dL (ref 8–23)
CO2: 25 mmol/L (ref 22–32)
Calcium: 8.8 mg/dL — ABNORMAL LOW (ref 8.9–10.3)
Chloride: 107 mmol/L (ref 98–111)
Creatinine, Ser: 1.18 mg/dL — ABNORMAL HIGH (ref 0.44–1.00)
GFR calc Af Amer: 53 mL/min — ABNORMAL LOW (ref >60)
GFR calc non Af Amer: 45 mL/min — ABNORMAL LOW (ref >60)
Glucose, Bld: 117 mg/dL — ABNORMAL HIGH (ref 70–99)
Potassium: 3.8 mmol/L (ref 3.5–5.1)
Sodium: 139 mmol/L (ref 135–145)
Total Bilirubin: 0.5 mg/dL (ref 0.3–1.2)
Total Protein: 7.7 g/dL (ref 6.5–8.1)

## 2018-12-16 NOTE — Patient Instructions (Addendum)
[place] at [facility] Discharge Instructions  Follow up in 4 weeks with the MD and labs Stay on normal treatment cycle.    Thank you for choosing [place] at [facility] to provide your oncology and hematology care.  To afford each patient quality time with our provider, please arrive at least 15 minutes before your scheduled appointment time.   If you have a lab appointment with the [facility] please come in thru the  Main Entrance and check in at the main information desk  You need to re-schedule your appointment should you arrive 10 or more minutes late.  We strive to give you quality time with our providers, and arriving late affects you and other patients whose appointments are after yours.  Also, if you no show three or more times for appointments you may be dismissed from the clinic at the providers discretion.     Again, thank you for choosing [facility].  Our hope is that these requests will decrease the amount of time that you wait before being seen by our physicians.       _____________________________________________________________  Should you have questions after your visit to [facility], please contact our office at [phone] between the hours of 8:00 a.m. and 4:30 p.m.  Voicemails left after 4:00 p.m. will not be returned until the following business day.  For prescription refill requests, have your pharmacy contact our office and allow 72 hours.    Cancer Center Support Programs:   > Cancer Support Group  2nd Tuesday of the month 1pm-2pm, Journey Room

## 2018-12-16 NOTE — Assessment & Plan Note (Addendum)
1.  IgG lambda multiple myeloma: - Revlimid 25mg and dexamethasone 3 weeks on 1 week off started in September 2009, changed to 2 weeks on 2 weeks off in November 2010, changed to 2 weeks on 3 weeks of in May 2012, Revlimid 15 mg 14/35 days and weekly Dex in January 2015, Revlimid 15 mg change to 14/42 days and weekly Dex in June 2017, dexamethasone stopped in November 2017 - Velcade and dexamethasone weekly, 3 weeks on/1 week off started recently -Dr. Lesser has called me to treat this patient locally so that she can follow-up with him periodically.  -She received Zometa monthly starting September 2009, switch to every 3 months in 2012 and lately off of it. - She was started on Velcade 1 mg/m, 3 weeks on 1 week off on 06/01/2018.  Today is her off week.  She has tolerated it very well.  Denies any neuropathy symptoms.  Takes dexamethasone 10 mg on days of Velcade. - Cycle 3-day 1 was started on 07/27/2018.  She is tolerating Velcade very well. - I have reviewed the results of the M spike which was stable at 1.9.  This was 1.93 on 05/20/2018.  Free light chain ratio is 0.2, previously 0.25.  Lambda light chains were previously 203.  They have come down to 130. -Since her M spike is not improving, I have recommended adding low-dose Revlimid.  We have added Revlimid 10 mg on a 2 weeks on 2 weeks of basis.  She is continuing Velcade on a 3 weeks on 1 week off basis.  She will also continue dexamethasone 10 mg on days of Velcade. -She started her cycle 1 of Revlimid on 08/14/2018. - We discussed the results of M spike on 11/16/2018 which was stable at 2.  However her free lambda light chains have gone up to 200. - She is tolerating Velcade without any neuropathy.  I have recommended increasing Velcade dose to 1.3 mg/m.  She is currently receiving 1 mg/m dose.  She is agreeable with this recommendation. - She will continue Revlimid 10 mg 2 weeks on, 2 weeks off. -We will plan to follow-up on M spike and free  light chains from today.  We will reevaluate her in 4 weeks.  2.  CNS MALT like lymphoma: -Treated with chemotherapy and radiation, last brain MRI was in 2013, CT of the brain in 2018 with no evidence of recurrence.  She does have dementia.  She will continue Aricept 10 mg daily.  

## 2018-12-16 NOTE — Progress Notes (Signed)
Amanda Wu, [place] [id number]   CLINIC:  Medical Oncology/Hematology  PCP:  Monico Blitz, MD [place] [address] [phone]   REASON FOR VISIT: Follow-up for multiple myeloma   CURRENT THERAPY: Velcade, Revlimid, and dexamethasone  BRIEF ONCOLOGIC HISTORY:    Multiple myeloma ([facility])   07/09/2017 Initial Diagnosis    Multiple myeloma ([facility] [facility])    06/01/2018 -  Chemotherapy    The patient had bortezomib SQ (VELCADE) chemo injection 1.75 mg, 1 mg/m2 = 1.75 mg (100 % of original dose 1 mg/m2), Subcutaneous,  Once, 7 of 9 cycles Dose modification: 1 mg/m2 (original dose 1 mg/m2, Cycle 1, Reason: Provider Judgment), 1.3 mg/m2 (original dose 1 mg/m2, Cycle 8, Reason: Provider Judgment) Administration: 1.75 mg (06/01/2018), 1.75 mg (06/08/2018), 1.75 mg (06/15/2018), 1.75 mg (06/29/2018), 1.75 mg (07/06/2018), 1.75 mg (07/13/2018), 1.75 mg (07/27/2018), 1.75 mg (08/03/2018), 1.75 mg (08/10/2018), 1.75 mg (08/24/2018), 1.75 mg (08/31/2018), 1.75 mg (09/07/2018), 1.75 mg (09/21/2018), 1.75 mg (09/28/2018), 1.75 mg (10/05/2018), 1.75 mg (10/19/2018), 1.75 mg (10/26/2018), 1.75 mg (11/02/2018), 1.75 mg (11/16/2018), 1.75 mg (11/23/2018), 1.75 mg (11/30/2018)  for chemotherapy treatment.       INTERVAL HISTORY:  Amanda Wu 73 y.o. female returns for routine follow-up for multiple myeloma. She is here today with her friend. She is doing well she has no complaints at this time. Denies any nausea, vomiting, or diarrhea. Denies any new pains. Had not noticed any recent bleeding such as epistaxis, hematuria or hematochezia. Denies recent chest pain on exertion, shortness of breath on minimal exertion, pre-syncopal episodes, or palpitations. Denies any numbness or tingling in hands or feet. Denies any recent fevers, infections, or recent hospitalizations. She reports her appetite and energy level at 50%.     REVIEW OF SYSTEMS:  Review of Systems  All other systems  reviewed and are negative.    PAST MEDICAL/SURGICAL HISTORY:  Past Medical History:  Diagnosis Date  . Benign hypertension   . Cataract   . 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 Diabetes mellitus without complication ([facility])   . H/O partial nephrectomy   . Hypokalemia   . Hypothyroidism   . Impaired cognition   . Multiple myeloma ([facility])    Dr Maylon Peppers, [facility]  . Thyroid disease    Past Surgical History:  Procedure Laterality Date  . ABDOMINAL HYSTERECTOMY    . CRANIOTOMY     for lymphoma  . YAG LASER APPLICATION Right 6/75/9163   Procedure: YAG LASER APPLICATION;  Surgeon: [name], MD;  Location: [facility];  Service: Ophthalmology;  Laterality: Right;     SOCIAL HISTORY:  Social History   Socioeconomic History  . Marital status: Single    Spouse name: Not on file  . Number of children: Not on file  . Years of education: Not on file  . Highest education level: Not on file  Occupational History  . Not on file  Social Needs  . Financial resource strain: Not on file  . Food insecurity:    Worry: Not on file    Inability: Not on file  . Transportation needs:    Medical: Not on file    Non-medical: Not on file  Tobacco Use  . Smoking status: Never Smoker  . Smokeless tobacco: Never Used  Substance and Sexual Activity  . Alcohol use: No  . Drug use: No  . Sexual activity: Not on file  Lifestyle  . Physical activity:  Days per week: Not on file    Minutes per session: Not on file  . Stress: Not on file  Relationships  . Social connections:    Talks on phone: Not on file    Gets together: Not on file    Attends religious service: Not on file    Active member of club or organization: Not on file    Attends meetings of clubs or organizations: Not on file    Relationship status: Not on file  . Intimate partner violence:    Fear of current or ex partner: Not on file    Emotionally abused: Not on file    Physically abused: Not on file    Forced sexual  activity: Not on file  Other Topics Concern  . Not on file  Social History Narrative  . Not on file    FAMILY HISTORY:  Family History  Problem Relation Age of Onset  . Depression Mother   . Diabetes Mother   . Heart disease Father   . Cancer - Prostate Brother   . Cancer Brother   . Cancer Sister     CURRENT MEDICATIONS:  Outpatient Encounter Medications as of 12/16/2018  Medication Sig Note  . ACCU-CHEK SMARTVIEW test strip    . acetaminophen (TYLENOL) 500 MG tablet Take 1,000 mg by mouth every 6 (six) hours as needed for mild pain.    Marland Kitchen acyclovir (ZOVIRAX) 400 MG tablet TAKE ONE TABLET BY MOUTH TWICE DAILY   . amLODipine (NORVASC) 5 MG tablet Take 5 mg by mouth daily.    Marland Kitchen aspirin EC 81 MG tablet Take 81 mg by mouth daily.   . bortezomib IV (VELCADE) 3.5 MG injection Inject into the vein.   . calcium-vitamin D (OSCAL WITH D) 500-200 MG-UNIT tablet Take 1 tablet by mouth daily with breakfast.   . dexamethasone (DECADRON) 4 MG tablet TAKE TWO AND ONE-HALF TABLETS BY MOUTH WEEKLY FOR THREE WEEKS (THREE WEEKS ON, ONE WEEK OFF) IN A ROW AND REPEAT MONTHLY 06/22/2018: Patient states that she is given the dexamethasone while here getting her velcade injection  . donepezil (ARICEPT) 10 MG tablet Take 1 tablet (10 mg total) by mouth every evening.   . ferrous sulfate 325 (65 FE) MG tablet Take 325 mg by mouth daily with breakfast.   . lenalidomide (REVLIMID) 10 MG capsule Take 1 tablet (10 mg) daily by mouth for 14 days then stop for 14 days.   Marland Kitchen levothyroxine (SYNTHROID, LEVOTHROID) 25 MCG tablet Take 25 mcg by mouth daily before breakfast.   . losartan (COZAAR) 100 MG tablet Take 100 mg by mouth daily.   . Multiple Vitamins-Minerals (MULTIVITAMIN WOMEN 50+ PO) Take by mouth.   . potassium chloride (K-DUR,KLOR-CON) 10 MEQ tablet Take 30 mEq by mouth daily.    No facility-administered encounter medications on file as of 12/16/2018.     ALLERGIES:  Allergies  Allergen Reactions  .  Lotensin [Benazepril Hcl]     Facial swelling (Angioedema) Note: ARBs should also be contraindicated, but she is on Losartan!     PHYSICAL EXAM:  ECOG Performance status: 1  Vitals:   12/16/18 1100  BP: (!) 115/54  Pulse: 79  Resp: 16  Temp: 98.9 F (37.2 C)  SpO2: 100%   Filed Weights   12/16/18 1100  Weight: 134 lb (60.8 kg)    Physical Exam Constitutional:      Appearance: Normal appearance. She is normal weight.  Musculoskeletal: Normal range of motion.  Skin:  General: Skin is warm and dry.  Neurological:     Mental Status: She is alert and oriented to person, place, and time. Mental status is at baseline.  Psychiatric:        Mood and Affect: Mood normal.        Behavior: Behavior normal.        Thought Content: Thought content normal.        Judgment: Judgment normal.      LABORATORY DATA:  I have reviewed the labs as listed.  CBC    Component Value Date/Time   WBC 5.6 12/16/2018 1108   RBC 3.17 (L) 12/16/2018 1108   HGB 9.2 (L) 12/16/2018 1108   HCT 30.2 (L) 12/16/2018 1108   PLT 162 12/16/2018 1108   MCV 95.3 12/16/2018 1108   MCH 29.0 12/16/2018 1108   MCHC 30.5 12/16/2018 1108   RDW 19.3 (H) 12/16/2018 1108   LYMPHSABS 0.8 12/16/2018 1108   MONOABS 0.4 12/16/2018 1108   EOSABS 0.4 12/16/2018 1108   BASOSABS 0.0 12/16/2018 1108   CMP Latest Ref Rng & Units 12/16/2018 11/30/2018 11/23/2018  Glucose 70 - 99 mg/dL 117(H) 93 97  BUN 8 - 23 mg/dL '21 20 19  ' Creatinine 0.44 - 1.00 mg/dL 1.18(H) 1.23(H) 1.18(H)  Sodium 135 - 145 mmol/L 139 138 140  Potassium 3.5 - 5.1 mmol/L 3.8 4.1 3.9  Chloride 98 - 111 mmol/L 107 107 109  CO2 22 - 32 mmol/L '25 26 26  ' Calcium 8.9 - 10.3 mg/dL 8.8(L) 9.1 9.0  Total Protein 6.5 - 8.1 g/dL 7.7 8.0 8.2(H)  Total Bilirubin 0.3 - 1.2 mg/dL 0.5 0.6 0.7  Alkaline Phos 38 - 126 U/L 44 41 46  AST 15 - 41 U/L 32 28 32  ALT 0 - 44 U/L '22 16 23       ' DIAGNOSTIC IMAGING:  I have independently reviewed the scans and  discussed with the patient.   I have reviewed Francene Finders, NP's note and agree with the documentation.  I personally performed a face-to-face visit, made revisions and my assessment and plan is as follows.    ASSESSMENT & PLAN:   Multiple myeloma (H[facility]) 1.  IgG lambda multiple myeloma: - Revlimid 55m and dexamethasone 3 weeks on 1 week off started in September 2009, changed to 2 weeks on 2 weeks off in November 2010, changed to 2 weeks on 3 weeks of in May 2012, Revlimid 15 mg 14/35 days and weekly Dex in January 2015, Revlimid 15 mg change to 14/42 days and weekly Dex in June 2017, dexamethasone stopped in November 2017 - Velcade and dexamethasone weekly, 3 weeks on/1 week off started recently -Dr. LMaylon Peppershas called me to treat this patient locally so that she can follow-up with him periodically.  -She received Zometa monthly starting September 2009, switch to every 3 months in 2012 and lately off of it. - She was started on Velcade 1 mg/m, 3 weeks on 1 week off on 06/01/2018.  Today is her off week.  She has tolerated it very well.  Denies any neuropathy symptoms.  Takes dexamethasone 10 mg on days of Velcade. - Cycle 3-day 1 was started on 07/27/2018.  She is tolerating Velcade very well. - I have reviewed the results of the M spike which was stable at 1.9.  This was 1.93 on 05/20/2018.  Free light chain ratio is 0.2, previously 0.25.  Lambda light chains were previously 203.  They have come down to 130. -Since her  M spike is not improving, I have recommended adding low-dose Revlimid.  We have added Revlimid 10 mg on a 2 weeks on 2 weeks of basis.  She is continuing Velcade on a 3 weeks on 1 week off basis.  She will also continue dexamethasone 10 mg on days of Velcade. -She started her cycle 1 of Revlimid on 08/14/2018. - We discussed the results of M spike on 11/16/2018 which was stable at 2.  However her free lambda light chains have gone up to 200. - She is tolerating Velcade without any  neuropathy.  I have recommended increasing Velcade dose to 1.3 mg/m.  She is currently receiving 1 mg/m dose.  She is agreeable with this recommendation. - She will continue Revlimid 10 mg 2 weeks on, 2 weeks off. -We will plan to follow-up on M spike and free light chains from today.  We will reevaluate her in 4 weeks.  2.  CNS MALT like lymphoma: -Treated with chemotherapy and radiation, last brain MRI was in 2013, CT of the brain in 2018 with no evidence of recurrence.  She does have dementia.  She will continue Aricept 10 mg daily.       Orders placed this encounter:  Orders Placed This Encounter  Procedures  . Lactate dehydrogenase  . Protein electrophoresis, serum  . Kappa/lambda light chains  . CBC with Differential/Platelet  . Comprehensive metabolic panel      Derek Jack, MD [place] [phone]

## 2018-12-17 ENCOUNTER — Encounter (HOSPITAL_COMMUNITY): Payer: Self-pay

## 2018-12-17 ENCOUNTER — Inpatient Hospital Stay (HOSPITAL_COMMUNITY): Payer: Medicare PPO

## 2018-12-17 VITALS — BP 116/65 | HR 71 | Temp 98.5°F | Resp 18

## 2018-12-17 DIAGNOSIS — C9000 Multiple myeloma not having achieved remission: Secondary | ICD-10-CM | POA: Diagnosis not present

## 2018-12-17 DIAGNOSIS — Z8572 Personal history of non-Hodgkin lymphomas: Secondary | ICD-10-CM | POA: Diagnosis not present

## 2018-12-17 DIAGNOSIS — Z5112 Encounter for antineoplastic immunotherapy: Secondary | ICD-10-CM | POA: Diagnosis not present

## 2018-12-17 LAB — PROTEIN ELECTROPHORESIS, SERUM
A/G Ratio: 0.7 (ref 0.7–1.7)
ALPHA-1-GLOBULIN: 0.2 g/dL (ref 0.0–0.4)
ALPHA-2-GLOBULIN: 0.8 g/dL (ref 0.4–1.0)
Albumin ELP: 3.1 g/dL (ref 2.9–4.4)
Beta Globulin: 0.7 g/dL (ref 0.7–1.3)
Gamma Globulin: 2.5 g/dL — ABNORMAL HIGH (ref 0.4–1.8)
Globulin, Total: 4.3 g/dL — ABNORMAL HIGH (ref 2.2–3.9)
M-Spike, %: 1.9 g/dL — ABNORMAL HIGH
Total Protein ELP: 7.4 g/dL (ref 6.0–8.5)

## 2018-12-17 LAB — KAPPA/LAMBDA LIGHT CHAINS
Kappa free light chain: 67.7 mg/L — ABNORMAL HIGH (ref 3.3–19.4)
Kappa, lambda light chain ratio: 0.38 (ref 0.26–1.65)
Lambda free light chains: 179.7 mg/L — ABNORMAL HIGH (ref 5.7–26.3)

## 2018-12-17 MED ORDER — HEPARIN SOD (PORK) LOCK FLUSH 100 UNIT/ML IV SOLN
INTRAVENOUS | Status: AC
  Filled 2018-12-17: qty 5

## 2018-12-17 MED ORDER — PROCHLORPERAZINE MALEATE 10 MG PO TABS: 10.0000 mg | ORAL_TABLET | Freq: Once | ORAL | Status: DC

## 2018-12-17 MED ORDER — BORTEZOMIB CHEMO SQ INJECTION 3.5 MG (2.5MG/ML)
1.3000 mg/m2 | Freq: Once | INTRAMUSCULAR | Status: AC
  Administered 2018-12-17: 2.25 mg via SUBCUTANEOUS
  Filled 2018-12-17: qty 0.9

## 2018-12-17 MED ORDER — DEXAMETHASONE 4 MG PO TABS
10.0000 mg | ORAL_TABLET | Freq: Once | ORAL | Status: AC
  Administered 2018-12-17: 10 mg via ORAL
  Filled 2018-12-17: qty 2.5

## 2018-12-17 NOTE — Progress Notes (Signed)
Amanda Wu tolerated Velcade injection well without complaints or incident. Labs from yesterday reviewed prior to administering this medication. VSS Pt discharged self ambulatory in satisfactory condition

## 2018-12-17 NOTE — Patient Instructions (Signed)
[facility] Discharge Instructions for Patients Receiving Chemotherapy   Beginning January 23rd 2017 lab work for the Cancer Center will be done in the  Main lab at [facility] on 1st floor. If you have a lab appointment with the Cancer Center please come in thru the  Main Entrance and check in at the main information desk   Today you received the following chemotherapy agents Velcade injection. Follow-up as scheduled. Call clinic for any questions or concerns  To help prevent nausea and vomiting after your treatment, we encourage you to take your nausea medication   If you develop nausea and vomiting, or diarrhea that is not controlled by your medication, call the clinic.  The clinic phone number is [phone]. Office hours are Monday-Friday 8:30am-5:00pm.  BELOW ARE SYMPTOMS THAT SHOULD BE REPORTED IMMEDIATELY:  *FEVER GREATER THAN 101.0 F  *CHILLS WITH OR WITHOUT FEVER  NAUSEA AND VOMITING THAT IS NOT CONTROLLED WITH YOUR NAUSEA MEDICATION  *UNUSUAL SHORTNESS OF BREATH  *UNUSUAL BRUISING OR BLEEDING  TENDERNESS IN MOUTH AND THROAT WITH OR WITHOUT PRESENCE OF ULCERS  *URINARY PROBLEMS  *BOWEL PROBLEMS  UNUSUAL RASH Items with * indicate a potential emergency and should be followed up as soon as possible. If you have an emergency after office hours please contact your primary care physician or go to the nearest emergency department.  Please call the clinic during office hours if you have any questions or concerns.   You may also contact the Patient Navigator at [phone] should you have any questions or need assistance in obtaining follow up care.      Resources For Cancer Patients and their Caregivers ? American Cancer Society: Can assist with transportation, wigs, general needs, runs Look Good Feel Better.        1-888-227-6333 ? Cancer Care: Provides financial assistance, online support groups, medication/co-pay assistance.   1-800-813-HOPE (4673) ? [facility] Assists [place] cancer patients and their families through emotional , educational and financial support.  336-427-4357 ? [place] DSS Where to apply for food stamps, Medicaid and utility assistance. 336-342-1394 ? [facility]: Transportation to medical appointments. 336-347-2287 ? Social Security Administration: May apply for disability if have a Stage IV cancer. 336-342-7796 1-800-772-1213 ? [place] Aging, Disability and Transit Services: Assists with nutrition, care and transit needs. 336-349-2343         

## 2018-12-23 ENCOUNTER — Ambulatory Visit (HOSPITAL_COMMUNITY): Payer: Medicare PPO

## 2018-12-23 ENCOUNTER — Other Ambulatory Visit (HOSPITAL_COMMUNITY): Payer: Medicare PPO

## 2018-12-24 ENCOUNTER — Other Ambulatory Visit: Payer: Self-pay

## 2018-12-24 ENCOUNTER — Inpatient Hospital Stay (HOSPITAL_COMMUNITY): Payer: Medicare PPO

## 2018-12-24 ENCOUNTER — Encounter (HOSPITAL_COMMUNITY): Payer: Self-pay

## 2018-12-24 ENCOUNTER — Inpatient Hospital Stay (HOSPITAL_COMMUNITY): Payer: Medicare PPO | Attending: Hematology

## 2018-12-24 VITALS — BP 108/50 | HR 57 | Temp 98.4°F | Resp 18 | Wt 134.7 lb

## 2018-12-24 DIAGNOSIS — Z5112 Encounter for antineoplastic immunotherapy: Secondary | ICD-10-CM | POA: Insufficient documentation

## 2018-12-24 DIAGNOSIS — C9000 Multiple myeloma not having achieved remission: Secondary | ICD-10-CM | POA: Insufficient documentation

## 2018-12-24 DIAGNOSIS — Z8572 Personal history of non-Hodgkin lymphomas: Secondary | ICD-10-CM | POA: Diagnosis not present

## 2018-12-24 LAB — CBC WITH DIFFERENTIAL/PLATELET
Abs Immature Granulocytes: 0.02 10*3/uL (ref 0.00–0.07)
BASOS PCT: 1 %
Basophils Absolute: 0 10*3/uL (ref 0.0–0.1)
EOS ABS: 0.3 10*3/uL (ref 0.0–0.5)
Eosinophils Relative: 6 %
HCT: 31.4 % — ABNORMAL LOW (ref 36.0–46.0)
Hemoglobin: 9.8 g/dL — ABNORMAL LOW (ref 12.0–15.0)
Immature Granulocytes: 0 %
Lymphocytes Relative: 22 %
Lymphs Abs: 1 10*3/uL (ref 0.7–4.0)
MCH: 29.9 pg (ref 26.0–34.0)
MCHC: 31.2 g/dL (ref 30.0–36.0)
MCV: 95.7 fL (ref 80.0–100.0)
Monocytes Absolute: 0.5 10*3/uL (ref 0.1–1.0)
Monocytes Relative: 11 %
Neutro Abs: 2.7 10*3/uL (ref 1.7–7.7)
Neutrophils Relative %: 60 %
PLATELETS: 89 10*3/uL — AB (ref 150–400)
RBC: 3.28 MIL/uL — AB (ref 3.87–5.11)
RDW: 19.1 % — ABNORMAL HIGH (ref 11.5–15.5)
WBC: 4.6 10*3/uL (ref 4.0–10.5)
nRBC: 0 % (ref 0.0–0.2)

## 2018-12-24 LAB — COMPREHENSIVE METABOLIC PANEL
ALT: 21 U/L (ref 0–44)
AST: 30 U/L (ref 15–41)
Albumin: 3.1 g/dL — ABNORMAL LOW (ref 3.5–5.0)
Alkaline Phosphatase: 41 U/L (ref 38–126)
Anion gap: 6 (ref 5–15)
BUN: 21 mg/dL (ref 8–23)
CO2: 25 mmol/L (ref 22–32)
Calcium: 8.9 mg/dL (ref 8.9–10.3)
Chloride: 108 mmol/L (ref 98–111)
Creatinine, Ser: 1.2 mg/dL — ABNORMAL HIGH (ref 0.44–1.00)
GFR calc non Af Amer: 44 mL/min — ABNORMAL LOW (ref >60)
GFR, EST AFRICAN AMERICAN: 52 mL/min — AB (ref >60)
Glucose, Bld: 111 mg/dL — ABNORMAL HIGH (ref 70–99)
Potassium: 3.8 mmol/L (ref 3.5–5.1)
Sodium: 139 mmol/L (ref 135–145)
Total Bilirubin: 0.6 mg/dL (ref 0.3–1.2)
Total Protein: 8 g/dL (ref 6.5–8.1)

## 2018-12-24 MED ORDER — PROCHLORPERAZINE MALEATE 10 MG PO TABS
10.0000 mg | ORAL_TABLET | Freq: Once | ORAL | Status: AC
  Administered 2018-12-24: 10 mg via ORAL
  Filled 2018-12-24: qty 1

## 2018-12-24 MED ORDER — DEXAMETHASONE 4 MG PO TABS
10.0000 mg | ORAL_TABLET | Freq: Once | ORAL | Status: AC
  Administered 2018-12-24: 10 mg via ORAL
  Filled 2018-12-24: qty 2.5

## 2018-12-24 MED ORDER — LENALIDOMIDE 10 MG PO CAPS: ORAL_CAPSULE | ORAL | 1 refills | Status: DC

## 2018-12-24 MED ORDER — BORTEZOMIB CHEMO SQ INJECTION 3.5 MG (2.5MG/ML)
1.3000 mg/m2 | Freq: Once | INTRAMUSCULAR | Status: AC
  Administered 2018-12-24: 2.25 mg via SUBCUTANEOUS
  Filled 2018-12-24: qty 0.9

## 2018-12-24 NOTE — Progress Notes (Signed)
Labs reviewed with MD. Will give velcade today per MD. Noted platelets at 89,000. Will send in a refill on her revlimid. Patient and sister stated they cannot remember the last time she took her revlimid.   Treatment given per orders. Patient tolerated it well without problems. Vitals stable and discharged home from clinic ambulatory. Follow up as scheduled.

## 2018-12-30 ENCOUNTER — Other Ambulatory Visit (HOSPITAL_COMMUNITY): Payer: Medicare PPO

## 2018-12-30 ENCOUNTER — Ambulatory Visit (HOSPITAL_COMMUNITY): Payer: Medicare PPO

## 2019-01-04 ENCOUNTER — Other Ambulatory Visit: Payer: Self-pay

## 2019-01-04 ENCOUNTER — Inpatient Hospital Stay (HOSPITAL_COMMUNITY): Payer: Medicare PPO

## 2019-01-04 ENCOUNTER — Encounter (HOSPITAL_COMMUNITY): Payer: Self-pay

## 2019-01-04 VITALS — BP 107/47 | HR 72 | Temp 98.4°F | Resp 18

## 2019-01-04 DIAGNOSIS — Z5112 Encounter for antineoplastic immunotherapy: Secondary | ICD-10-CM | POA: Diagnosis not present

## 2019-01-04 DIAGNOSIS — C9000 Multiple myeloma not having achieved remission: Secondary | ICD-10-CM

## 2019-01-04 DIAGNOSIS — Z8572 Personal history of non-Hodgkin lymphomas: Secondary | ICD-10-CM | POA: Diagnosis not present

## 2019-01-04 LAB — COMPREHENSIVE METABOLIC PANEL
ALT: 20 U/L (ref 0–44)
AST: 31 U/L (ref 15–41)
Albumin: 3 g/dL — ABNORMAL LOW (ref 3.5–5.0)
Alkaline Phosphatase: 42 U/L (ref 38–126)
Anion gap: 8 (ref 5–15)
BUN: 18 mg/dL (ref 8–23)
CALCIUM: 9 mg/dL (ref 8.9–10.3)
CO2: 26 mmol/L (ref 22–32)
CREATININE: 1.25 mg/dL — AB (ref 0.44–1.00)
Chloride: 105 mmol/L (ref 98–111)
GFR calc non Af Amer: 42 mL/min — ABNORMAL LOW (ref >60)
GFR, EST AFRICAN AMERICAN: 49 mL/min — AB (ref >60)
Glucose, Bld: 85 mg/dL (ref 70–99)
Potassium: 3.9 mmol/L (ref 3.5–5.1)
Sodium: 139 mmol/L (ref 135–145)
Total Bilirubin: 0.4 mg/dL (ref 0.3–1.2)
Total Protein: 7.7 g/dL (ref 6.5–8.1)

## 2019-01-04 LAB — CBC WITH DIFFERENTIAL/PLATELET
Abs Immature Granulocytes: 0.01 10*3/uL (ref 0.00–0.07)
Basophils Absolute: 0.1 10*3/uL (ref 0.0–0.1)
Basophils Relative: 1 %
Eosinophils Absolute: 0.2 10*3/uL (ref 0.0–0.5)
Eosinophils Relative: 4 %
HCT: 31.4 % — ABNORMAL LOW (ref 36.0–46.0)
Hemoglobin: 9.5 g/dL — ABNORMAL LOW (ref 12.0–15.0)
Immature Granulocytes: 0 %
Lymphocytes Relative: 15 %
Lymphs Abs: 0.7 10*3/uL (ref 0.7–4.0)
MCH: 29.1 pg (ref 26.0–34.0)
MCHC: 30.3 g/dL (ref 30.0–36.0)
MCV: 96 fL (ref 80.0–100.0)
MONO ABS: 0.3 10*3/uL (ref 0.1–1.0)
Monocytes Relative: 6 %
NEUTROS ABS: 3.7 10*3/uL (ref 1.7–7.7)
Neutrophils Relative %: 74 %
Platelets: 158 10*3/uL (ref 150–400)
RBC: 3.27 MIL/uL — AB (ref 3.87–5.11)
RDW: 19.6 % — ABNORMAL HIGH (ref 11.5–15.5)
WBC: 5 10*3/uL (ref 4.0–10.5)
nRBC: 0 % (ref 0.0–0.2)

## 2019-01-04 LAB — LACTATE DEHYDROGENASE: LDH: 167 U/L (ref 98–192)

## 2019-01-04 MED ORDER — PROCHLORPERAZINE MALEATE 10 MG PO TABS: 10.0000 mg | ORAL_TABLET | Freq: Once | ORAL | Status: DC

## 2019-01-04 MED ORDER — BORTEZOMIB CHEMO SQ INJECTION 3.5 MG (2.5MG/ML)
1.3000 mg/m2 | Freq: Once | INTRAMUSCULAR | Status: AC
  Administered 2019-01-04: 2.25 mg via SUBCUTANEOUS
  Filled 2019-01-04: qty 0.9

## 2019-01-04 MED ORDER — DEXAMETHASONE 6 MG PO TABS
10.0000 mg | ORAL_TABLET | Freq: Once | ORAL | Status: AC
  Administered 2019-01-04: 10 mg via ORAL
  Filled 2019-01-04: qty 1

## 2019-01-04 NOTE — Progress Notes (Unsigned)
Patient for treatment today. No new issues at this time. Proceed with treatment today per parameters.

## 2019-01-05 LAB — KAPPA/LAMBDA LIGHT CHAINS
Kappa free light chain: 30.8 mg/L — ABNORMAL HIGH (ref 3.3–19.4)
Kappa, lambda light chain ratio: 0.28 (ref 0.26–1.65)
LAMDA FREE LIGHT CHAINS: 112 mg/L — AB (ref 5.7–26.3)

## 2019-01-06 DIAGNOSIS — Z6820 Body mass index (BMI) 20.0-20.9, adult: Secondary | ICD-10-CM | POA: Diagnosis not present

## 2019-01-06 DIAGNOSIS — C8589 Other specified types of non-Hodgkin lymphoma, extranodal and solid organ sites: Secondary | ICD-10-CM | POA: Diagnosis not present

## 2019-01-06 DIAGNOSIS — Z299 Encounter for prophylactic measures, unspecified: Secondary | ICD-10-CM | POA: Diagnosis not present

## 2019-01-06 DIAGNOSIS — I10 Essential (primary) hypertension: Secondary | ICD-10-CM | POA: Diagnosis not present

## 2019-01-06 DIAGNOSIS — C9000 Multiple myeloma not having achieved remission: Secondary | ICD-10-CM | POA: Diagnosis not present

## 2019-01-06 DIAGNOSIS — E1165 Type 2 diabetes mellitus with hyperglycemia: Secondary | ICD-10-CM | POA: Diagnosis not present

## 2019-01-06 DIAGNOSIS — R197 Diarrhea, unspecified: Secondary | ICD-10-CM | POA: Diagnosis not present

## 2019-01-06 LAB — PROTEIN ELECTROPHORESIS, SERUM
A/G Ratio: 0.7 (ref 0.7–1.7)
Albumin ELP: 3 g/dL (ref 2.9–4.4)
Alpha-1-Globulin: 0.3 g/dL (ref 0.0–0.4)
Alpha-2-Globulin: 0.7 g/dL (ref 0.4–1.0)
Beta Globulin: 0.8 g/dL (ref 0.7–1.3)
GAMMA GLOBULIN: 2.3 g/dL — AB (ref 0.4–1.8)
Globulin, Total: 4.1 g/dL — ABNORMAL HIGH (ref 2.2–3.9)
M-Spike, %: 1.9 g/dL — ABNORMAL HIGH
Total Protein ELP: 7.1 g/dL (ref 6.0–8.5)

## 2019-01-06 MED ORDER — OCTREOTIDE ACETATE 30 MG IM KIT
PACK | INTRAMUSCULAR | Status: AC
  Filled 2019-01-06: qty 1

## 2019-01-17 ENCOUNTER — Other Ambulatory Visit (HOSPITAL_COMMUNITY): Payer: Self-pay | Admitting: Nurse Practitioner

## 2019-01-18 ENCOUNTER — Inpatient Hospital Stay (HOSPITAL_COMMUNITY): Payer: Medicare PPO | Admitting: Hematology

## 2019-01-18 ENCOUNTER — Encounter (HOSPITAL_COMMUNITY): Payer: Self-pay | Admitting: Hematology

## 2019-01-18 ENCOUNTER — Inpatient Hospital Stay (HOSPITAL_COMMUNITY): Payer: Medicare PPO

## 2019-01-18 ENCOUNTER — Other Ambulatory Visit: Payer: Self-pay

## 2019-01-18 ENCOUNTER — Inpatient Hospital Stay (HOSPITAL_COMMUNITY): Payer: Medicare PPO | Attending: Hematology

## 2019-01-18 VITALS — BP 119/70 | HR 50 | Temp 98.6°F | Resp 14 | Wt 134.0 lb

## 2019-01-18 DIAGNOSIS — C9000 Multiple myeloma not having achieved remission: Secondary | ICD-10-CM

## 2019-01-18 DIAGNOSIS — E039 Hypothyroidism, unspecified: Secondary | ICD-10-CM | POA: Insufficient documentation

## 2019-01-18 DIAGNOSIS — Z5112 Encounter for antineoplastic immunotherapy: Secondary | ICD-10-CM | POA: Diagnosis not present

## 2019-01-18 DIAGNOSIS — F0390 Unspecified dementia without behavioral disturbance: Secondary | ICD-10-CM | POA: Insufficient documentation

## 2019-01-18 DIAGNOSIS — Z8572 Personal history of non-Hodgkin lymphomas: Secondary | ICD-10-CM | POA: Insufficient documentation

## 2019-01-18 LAB — CBC WITH DIFFERENTIAL/PLATELET
Abs Immature Granulocytes: 0.03 10*3/uL (ref 0.00–0.07)
Basophils Absolute: 0 10*3/uL (ref 0.0–0.1)
Basophils Relative: 1 %
Eosinophils Absolute: 0.3 10*3/uL (ref 0.0–0.5)
Eosinophils Relative: 6 %
HCT: 30.9 % — ABNORMAL LOW (ref 36.0–46.0)
Hemoglobin: 9.7 g/dL — ABNORMAL LOW (ref 12.0–15.0)
Immature Granulocytes: 1 %
Lymphocytes Relative: 16 %
Lymphs Abs: 0.9 10*3/uL (ref 0.7–4.0)
MCH: 29.9 pg (ref 26.0–34.0)
MCHC: 31.4 g/dL (ref 30.0–36.0)
MCV: 95.4 fL (ref 80.0–100.0)
MONO ABS: 0.4 10*3/uL (ref 0.1–1.0)
Monocytes Relative: 8 %
Neutro Abs: 3.8 10*3/uL (ref 1.7–7.7)
Neutrophils Relative %: 68 %
Platelets: 134 10*3/uL — ABNORMAL LOW (ref 150–400)
RBC: 3.24 MIL/uL — ABNORMAL LOW (ref 3.87–5.11)
RDW: 18.8 % — ABNORMAL HIGH (ref 11.5–15.5)
WBC: 5.5 10*3/uL (ref 4.0–10.5)
nRBC: 0 % (ref 0.0–0.2)

## 2019-01-18 LAB — COMPREHENSIVE METABOLIC PANEL
ALT: 22 U/L (ref 0–44)
AST: 33 U/L (ref 15–41)
Albumin: 3.1 g/dL — ABNORMAL LOW (ref 3.5–5.0)
Alkaline Phosphatase: 44 U/L (ref 38–126)
Anion gap: 7 (ref 5–15)
BUN: 18 mg/dL (ref 8–23)
CO2: 26 mmol/L (ref 22–32)
Calcium: 8.8 mg/dL — ABNORMAL LOW (ref 8.9–10.3)
Chloride: 106 mmol/L (ref 98–111)
Creatinine, Ser: 1.25 mg/dL — ABNORMAL HIGH (ref 0.44–1.00)
GFR calc Af Amer: 49 mL/min — ABNORMAL LOW (ref >60)
GFR, EST NON AFRICAN AMERICAN: 42 mL/min — AB (ref >60)
GLUCOSE: 140 mg/dL — AB (ref 70–99)
POTASSIUM: 3.9 mmol/L (ref 3.5–5.1)
Sodium: 139 mmol/L (ref 135–145)
Total Bilirubin: 0.3 mg/dL (ref 0.3–1.2)
Total Protein: 8 g/dL (ref 6.5–8.1)

## 2019-01-18 MED ORDER — DEXAMETHASONE 4 MG PO TABS
10.0000 mg | ORAL_TABLET | Freq: Once | ORAL | Status: AC
  Administered 2019-01-18: 10 mg via ORAL
  Filled 2019-01-18: qty 2.5

## 2019-01-18 MED ORDER — BORTEZOMIB CHEMO SQ INJECTION 3.5 MG (2.5MG/ML)
1.3000 mg/m2 | Freq: Once | INTRAMUSCULAR | Status: AC
  Administered 2019-01-18: 2.25 mg via SUBCUTANEOUS
  Filled 2019-01-18: qty 0.9

## 2019-01-18 MED ORDER — PROCHLORPERAZINE MALEATE 10 MG PO TABS: 10.0000 mg | ORAL_TABLET | Freq: Once | ORAL | Status: DC

## 2019-01-18 NOTE — Assessment & Plan Note (Signed)
1.  IgG lambda multiple myeloma: - Revlimid 34m and dexamethasone 3 weeks on 1 week off started in September 2009, changed to 2 weeks on 2 weeks off in November 2010, changed to 2 weeks on 3 weeks of in May 2012, Revlimid 15 mg 14/35 days and weekly Dex in January 2015, Revlimid 15 mg change to 14/42 days and weekly Dex in June 2017, dexamethasone stopped in November 2017 - Velcade and dexamethasone weekly, 3 weeks on/1 week off started recently -Dr. LMaylon Peppershas called me to treat this patient locally so that she can follow-up with him periodically.  -She received Zometa monthly starting September 2009, switch to every 3 months in 2012 and lately off of it. - She was started on Velcade 1 mg/m, 3 weeks on 1 week off on 06/01/2018.  Today is her off week.  She has tolerated it very well.  Denies any neuropathy symptoms.  Takes dexamethasone 10 mg on days of Velcade. -She was started on Revlimid on 08/14/2018 as the M spike and light chains were not improving..   - Myeloma panel on 11/16/2018 shows M spike of 2 g/dL.  Free lambda light chains have gone up to 200.  - At last visit on 12/16/2018, we have increased the dose of Velcade to 1.3 mg/m.  She has tolerated it very well. -He did not experience any peripheral neuropathy symptoms. -We discussed the results of the myeloma panel dated 01/04/2019 which shows M spike of 1.9 g/dL.  This is stable compared to last result.  Free light chain ratio has improved to 0.28 from 0.38.  Lambda light chains have improved to 112 from 179. - We will continue at the same dose level of Velcade 1.3 mg/m 3 weeks on 1 week off along with Revlimid 10 mg 2 weeks on 2 weeks off.  She takes dexamethasone 10 mg on days of Velcade. -We will see her back in 8 weeks for follow-up.  2.  CNS MALT like lymphoma: -Treated with chemotherapy and radiation, last brain MRI was in 2013, CT of the brain in 2018 with no evidence of recurrence.  She does have dementia.  She will continue Aricept  10 mg daily.

## 2019-01-18 NOTE — Progress Notes (Signed)
[place], [place] [address]   CLINIC:  Medical Oncology/Hematology  PCP:  Monico Blitz, MD [place] [address] [phone]   REASON FOR VISIT: Follow-up for multiple myeloma  CURRENT THERAPY:Velcade, Revlimid, and dexamethasone  BRIEF ONCOLOGIC HISTORY:    Multiple myeloma ([place])   07/09/2017 Initial Diagnosis    Multiple myeloma ([place])    06/01/2018 -  Chemotherapy    The patient had bortezomib SQ (VELCADE) chemo injection 1.75 mg, 1 mg/m2 = 1.75 mg (100 % of original dose 1 mg/m2), Subcutaneous,  Once, 9 of 13 cycles Dose modification: 1 mg/m2 (original dose 1 mg/m2, Cycle 1, Reason: Provider Judgment), 1.3 mg/m2 (original dose 1 mg/m2, Cycle 8, Reason: Provider Judgment) Administration: 1.75 mg (06/01/2018), 1.75 mg (06/08/2018), 1.75 mg (06/15/2018), 1.75 mg (06/29/2018), 1.75 mg (07/06/2018), 1.75 mg (07/13/2018), 1.75 mg (07/27/2018), 1.75 mg (08/03/2018), 1.75 mg (08/10/2018), 1.75 mg (08/24/2018), 1.75 mg (08/31/2018), 1.75 mg (09/07/2018), 1.75 mg (09/21/2018), 1.75 mg (09/28/2018), 1.75 mg (10/05/2018), 1.75 mg (10/19/2018), 1.75 mg (10/26/2018), 1.75 mg (11/02/2018), 1.75 mg (11/16/2018), 1.75 mg (11/23/2018), 1.75 mg (11/30/2018), 2.25 mg (12/17/2018), 2.25 mg (12/24/2018), 2.25 mg (01/04/2019), 2.25 mg (01/18/2019)  for chemotherapy treatment.       INTERVAL HISTORY:  Amanda Wu 73 y.o. female returns for routine follow-up for multiple myeloma. She is here today with her friend. She is doing well. She did report an episode of diarrhea about two weeks ago. It has resolved now. Denies any nausea, vomiting, or diarrhea. Denies any new pains. Had not noticed any recent bleeding such as epistaxis, hematuria or hematochezia. Denies recent chest pain on exertion, shortness of breath on minimal exertion, pre-syncopal episodes, or palpitations. Denies any numbness or tingling in hands or feet. Denies any recent fevers, infections, or recent  hospitalizations. Patient reports appetite at 100% and energy level at 100%.   REVIEW OF SYSTEMS:  Review of Systems  All other systems reviewed and are negative.    PAST MEDICAL/SURGICAL HISTORY:  Past Medical History:  Diagnosis Date  . Benign hypertension   . Cataract   . 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 Diabetes mellitus without complication ([name])   . H/O partial nephrectomy   . Hypokalemia   . Hypothyroidism   . Impaired cognition   . Multiple myeloma (HCC)    Dr Maylon Peppers, [facility]  . Thyroid disease    Past Surgical History:  Procedure Laterality Date  . ABDOMINAL HYSTERECTOMY    . CRANIOTOMY     for lymphoma  . YAG LASER APPLICATION Right [id number]/3532   Procedure: YAG LASER APPLICATION;  Surgeon: [name], MD;  Location: [facility];  Service: Ophthalmology;  Laterality: Right;     SOCIAL HISTORY:  Social History   Socioeconomic History  . Marital status: Single    Spouse name: Not on file  . Number of children: Not on file  . Years of education: Not on file  . Highest education level: Not on file  Occupational History  . Not on file  Social Needs  . Financial resource strain: Not on file  . Food insecurity:    Worry: Not on file    Inability: Not on file  . Transportation needs:    Medical: Not on file    Non-medical: Not on file  Tobacco Use  . Smoking status: Never Smoker  . Smokeless tobacco: Never Used  Substance and Sexual Activity  . Alcohol use: No  . Drug use:  No  . Sexual activity: Not on file  Lifestyle  . Physical activity:    Days per week: Not on file    Minutes per session: Not on file  . Stress: Not on file  Relationships  . Social connections:    Talks on phone: Not on file    Gets together: Not on file    Attends religious service: Not on file    Active member of club or organization: Not on file    Attends meetings of clubs or organizations: Not on file    Relationship status: Not on file  . Intimate partner  violence:    Fear of current or ex partner: Not on file    Emotionally abused: Not on file    Physically abused: Not on file    Forced sexual activity: Not on file  Other Topics Concern  . Not on file  Social History Narrative  . Not on file    FAMILY HISTORY:  Family History  Problem Relation Age of Onset  . Depression Mother   . Diabetes Mother   . Heart disease Father   . Cancer - Prostate Brother   . Cancer Brother   . Cancer Sister     CURRENT MEDICATIONS:  Outpatient Encounter Medications as of 01/18/2019  Medication Sig Note  . ACCU-CHEK SMARTVIEW test strip    . acetaminophen (TYLENOL) 500 MG tablet Take 1,000 mg by mouth every 6 (six) hours as needed for mild pain.    Marland Kitchen acyclovir (ZOVIRAX) 400 MG tablet TAKE ONE TABLET BY MOUTH TWICE DAILY   . amLODipine (NORVASC) 5 MG tablet Take 5 mg by mouth daily.    Marland Kitchen aspirin EC 81 MG tablet Take 81 mg by mouth daily.   . bortezomib IV (VELCADE) 3.5 MG injection Inject into the vein.   . calcium-vitamin D (OSCAL WITH D) 500-200 MG-UNIT tablet Take 1 tablet by mouth daily with breakfast.   . dexamethasone (DECADRON) 4 MG tablet TAKE TWO AND ONE-HALF TABLETS BY MOUTH WEEKLY FOR THREE WEEKS (THREE WEEKS ON, ONE WEEK OFF) IN A ROW AND REPEAT MONTHLY 06/22/2018: Patient states that she is given the dexamethasone while here getting her velcade injection  . donepezil (ARICEPT) 10 MG tablet TAKE 1 TABLET BY MOUTH every evening   . ferrous sulfate 325 (65 FE) MG tablet Take 325 mg by mouth daily with breakfast.   . lenalidomide (REVLIMID) 10 MG capsule Take 1 tablet (10 mg) daily by mouth for 14 days then stop for 14 days.   Marland Kitchen levothyroxine (SYNTHROID, LEVOTHROID) 25 MCG tablet Take 25 mcg by mouth daily before breakfast.   . losartan (COZAAR) 100 MG tablet Take 100 mg by mouth daily.   . Multiple Vitamins-Minerals (MULTIVITAMIN WOMEN 50+ PO) Take by mouth.   . potassium chloride (K-DUR,KLOR-CON) 10 MEQ tablet Take 30 mEq by mouth daily.     Facility-Administered Encounter Medications as of 01/18/2019  Medication  . prochlorperazine (COMPAZINE) tablet 10 mg    ALLERGIES:  Allergies  Allergen Reactions  . Lotensin [Benazepril Hcl]     Facial swelling (Angioedema) Note: ARBs should also be contraindicated, but she is on Losartan!     PHYSICAL EXAM:  ECOG Performance status: 1  Vitals:   01/18/19 1100  BP: 119/70  Pulse: (!) 50  Resp: 14  Temp: 98.6 F (37 C)  SpO2: 94%   Filed Weights   01/18/19 1100  Weight: 134 lb (60.8 kg)    Physical Exam Constitutional:  Appearance: Normal appearance. She is normal weight.  Cardiovascular:     Rate and Rhythm: Normal rate and regular rhythm.     Heart sounds: Normal heart sounds.  Pulmonary:     Effort: Pulmonary effort is normal.     Breath sounds: Normal breath sounds.  Musculoskeletal: Normal range of motion.  Skin:    General: Skin is warm and dry.  Neurological:     Mental Status: She is alert and oriented to person, place, and time. Mental status is at baseline.  Psychiatric:        Mood and Affect: Mood normal.        Behavior: Behavior normal.        Thought Content: Thought content normal.        Judgment: Judgment normal.      LABORATORY DATA:  I have reviewed the labs as listed.  CBC    Component Value Date/Time   WBC 5.5 01/18/2019 1011   RBC 3.24 (L) 01/18/2019 1011   HGB 9.7 (L) 01/18/2019 1011   HCT 30.9 (L) 01/18/2019 1011   PLT 134 (L) 01/18/2019 1011   MCV 95.4 01/18/2019 1011   MCH 29.9 01/18/2019 1011   MCHC 31.4 01/18/2019 1011   RDW 18.8 (H) 01/18/2019 1011   LYMPHSABS 0.9 01/18/2019 1011   MONOABS 0.4 01/18/2019 1011   EOSABS 0.3 01/18/2019 1011   BASOSABS 0.0 01/18/2019 1011   CMP Latest Ref Rng & Units 01/18/2019 01/04/2019 12/24/2018  Glucose 70 - 99 mg/dL 140(H) 85 111(H)  BUN 8 - 23 mg/dL '18 18 21  ' Creatinine 0.44 - 1.00 mg/dL 1.25(H) 1.25(H) 1.20(H)  Sodium 135 - 145 mmol/L 139 139 139  Potassium 3.5 - 5.1  mmol/L 3.9 3.9 3.8  Chloride 98 - 111 mmol/L 106 105 108  CO2 22 - 32 mmol/L '26 26 25  ' Calcium 8.9 - 10.3 mg/dL 8.8(L) 9.0 8.9  Total Protein 6.5 - 8.1 g/dL 8.0 7.7 8.0  Total Bilirubin 0.3 - 1.2 mg/dL 0.3 0.4 0.6  Alkaline Phos 38 - 126 U/L 44 42 41  AST 15 - 41 U/L 33 31 30  ALT 0 - 44 U/L '22 20 21       ' DIAGNOSTIC IMAGING:  I have independently reviewed the scans and discussed with the patient.   I have reviewed Francene Finders, NP's note and agree with the documentation.  I personally performed a face-to-face visit, made revisions and my assessment and plan is as follows.    ASSESSMENT & PLAN:   Multiple myeloma (HCC) 1.  IgG lambda multiple myeloma: - Revlimid 54m and dexamethasone 3 weeks on 1 week off started in September 2009, changed to 2 weeks on 2 weeks off in November 2010, changed to 2 weeks on 3 weeks of in May 2012, Revlimid 15 mg 14/35 days and weekly Dex in January 2015, Revlimid 15 mg change to 14/42 days and weekly Dex in June 2017, dexamethasone stopped in November 2017 - Velcade and dexamethasone weekly, 3 weeks on/1 week off started recently -Dr. LMaylon Peppershas called me to treat this patient locally so that she can follow-up with him periodically.  -She received Zometa monthly starting September 2009, switch to every 3 months in 2012 and lately off of it. - She was started on Velcade 1 mg/m, 3 weeks on 1 week off on 06/01/2018.  Today is her off week.  She has tolerated it very well.  Denies any neuropathy symptoms.  Takes dexamethasone 10 mg on days of  Velcade. -She was started on Revlimid on 08/14/2018 as the M spike and light chains were not improving..   - Myeloma panel on 11/16/2018 shows M spike of 2 g/dL.  Free lambda light chains have gone up to 200.  - At last visit on 12/16/2018, we have increased the dose of Velcade to 1.3 mg/m.  She has tolerated it very well. -He did not experience any peripheral neuropathy symptoms. -We discussed the results of the  myeloma panel dated 01/04/2019 which shows M spike of 1.9 g/dL.  This is stable compared to last result.  Free light chain ratio has improved to 0.28 from 0.38.  Lambda light chains have improved to 112 from 179. - We will continue at the same dose level of Velcade 1.3 mg/m 3 weeks on 1 week off along with Revlimid 10 mg 2 weeks on 2 weeks off.  She takes dexamethasone 10 mg on days of Velcade. -We will see her back in 8 weeks for follow-up.  2.  CNS MALT like lymphoma: -Treated with chemotherapy and radiation, last brain MRI was in 2013, CT of the brain in 2018 with no evidence of recurrence.  She does have dementia.  She will continue Aricept 10 mg daily.       Orders placed this encounter:  Orders Placed This Encounter  Procedures  . Lactate dehydrogenase  . Protein electrophoresis, serum  . Kappa/lambda light chains  . CBC with Differential/Platelet  . Comprehensive metabolic panel      Derek Jack, MD [place] [phone]

## 2019-01-18 NOTE — Patient Instructions (Signed)
[facility] at [facility] Discharge Instructions     Thank you for choosing [facility] at [facility] to provide your oncology and hematology care.  To afford each patient quality time with our provider, please arrive at least 15 minutes before your scheduled appointment time.   If you have a lab appointment with the Cancer Center please come in thru the  Main Entrance and check in at the main information desk  You need to re-schedule your appointment should you arrive 10 or more minutes late.  We strive to give you quality time with our providers, and arriving late affects you and other patients whose appointments are after yours.  Also, if you no show three or more times for appointments you may be dismissed from the clinic at the providers discretion.     Again, thank you for choosing [facility].  Our hope is that these requests will decrease the amount of time that you wait before being seen by our physicians.       _____________________________________________________________  Should you have questions after your visit to [facility], please contact our office at [phone] between the hours of 8:00 a.m. and 4:30 p.m.  Voicemails left after 4:00 p.m. will not be returned until the following business day.  For prescription refill requests, have your pharmacy contact our office and allow 72 hours.    Cancer Center Support Programs:   > Cancer Support Group  2nd Tuesday of the month 1pm-2pm, Journey Room    

## 2019-01-18 NOTE — Progress Notes (Signed)
Per RLockamy NP. Proceed with Velcade injection. Pt saw Dr. Delton Coombes today.   MAR reviewed. Pt takes Revlimid as prescriped per pt and sister.   Amanda Wu presents today for Velcade  per MD orders.  Administered SQ in right Abdomen. Administration without incident. Patient tolerated well.   No complaints at this time. Discharged from clinic ambulatory. F/U with [facility] as scheduled.

## 2019-01-18 NOTE — Patient Instructions (Signed)
[facility] at [facility]  Discharge Instructions:   _______________________________________________________________  Thank you for choosing [facility] at [facility] to provide your oncology and hematology care.  To afford each patient quality time with our providers, please arrive at least 15 minutes before your scheduled appointment.  You need to re-schedule your appointment if you arrive 10 or more minutes late.  We strive to give you quality time with our providers, and arriving late affects you and other patients whose appointments are after yours.  Also, if you no show three or more times for appointments you may be dismissed from the clinic.  Again, thank you for choosing [facility]. Our hope is that these requests will allow you access to exceptional care and in a timely manner. _______________________________________________________________  If you have questions after your visit, please contact our office at [phone] between the hours of 8:30 a.m. and 5:00 p.m. Voicemails left after 4:30 p.m. will not be returned until the following business day. _______________________________________________________________  For prescription refill requests, have your pharmacy contact our office. _______________________________________________________________  Recommendations made by the consultant and any test results will be sent to your referring physician. _______________________________________________________________ 

## 2019-01-19 MED ORDER — FULVESTRANT 250 MG/5ML IM SOLN
INTRAMUSCULAR | Status: AC
  Filled 2019-01-19: qty 5

## 2019-01-21 ENCOUNTER — Other Ambulatory Visit (HOSPITAL_COMMUNITY): Payer: Self-pay | Admitting: *Deleted

## 2019-01-21 DIAGNOSIS — C9000 Multiple myeloma not having achieved remission: Secondary | ICD-10-CM

## 2019-01-21 MED ORDER — LENALIDOMIDE 10 MG PO CAPS: ORAL_CAPSULE | ORAL | 1 refills | Status: DC

## 2019-01-21 MED ORDER — HEPARIN SOD (PORK) LOCK FLUSH 100 UNIT/ML IV SOLN
INTRAVENOUS | Status: AC
  Filled 2019-01-21: qty 5

## 2019-01-21 NOTE — Telephone Encounter (Signed)
Chart reviewed, revlimid refilled. 

## 2019-01-25 ENCOUNTER — Encounter (HOSPITAL_COMMUNITY): Payer: Self-pay

## 2019-01-25 ENCOUNTER — Inpatient Hospital Stay (HOSPITAL_COMMUNITY): Payer: Medicare PPO

## 2019-01-25 VITALS — BP 126/60 | HR 66 | Temp 98.5°F | Resp 18 | Wt 138.0 lb

## 2019-01-25 DIAGNOSIS — Z8572 Personal history of non-Hodgkin lymphomas: Secondary | ICD-10-CM | POA: Diagnosis not present

## 2019-01-25 DIAGNOSIS — C9000 Multiple myeloma not having achieved remission: Secondary | ICD-10-CM

## 2019-01-25 DIAGNOSIS — Z5112 Encounter for antineoplastic immunotherapy: Secondary | ICD-10-CM | POA: Diagnosis not present

## 2019-01-25 DIAGNOSIS — F0390 Unspecified dementia without behavioral disturbance: Secondary | ICD-10-CM | POA: Diagnosis not present

## 2019-01-25 DIAGNOSIS — E039 Hypothyroidism, unspecified: Secondary | ICD-10-CM | POA: Diagnosis not present

## 2019-01-25 LAB — COMPREHENSIVE METABOLIC PANEL
ALT: 19 U/L (ref 0–44)
ANION GAP: 5 (ref 5–15)
AST: 30 U/L (ref 15–41)
Albumin: 3 g/dL — ABNORMAL LOW (ref 3.5–5.0)
Alkaline Phosphatase: 39 U/L (ref 38–126)
BUN: 20 mg/dL (ref 8–23)
CHLORIDE: 107 mmol/L (ref 98–111)
CO2: 27 mmol/L (ref 22–32)
Calcium: 9.2 mg/dL (ref 8.9–10.3)
Creatinine, Ser: 1.17 mg/dL — ABNORMAL HIGH (ref 0.44–1.00)
GFR calc Af Amer: 53 mL/min — ABNORMAL LOW (ref >60)
GFR calc non Af Amer: 46 mL/min — ABNORMAL LOW (ref >60)
Glucose, Bld: 90 mg/dL (ref 70–99)
POTASSIUM: 4 mmol/L (ref 3.5–5.1)
Sodium: 139 mmol/L (ref 135–145)
Total Bilirubin: 0.3 mg/dL (ref 0.3–1.2)
Total Protein: 7.5 g/dL (ref 6.5–8.1)

## 2019-01-25 LAB — CBC WITH DIFFERENTIAL/PLATELET
Abs Immature Granulocytes: 0 10*3/uL (ref 0.00–0.07)
BASOS ABS: 0 10*3/uL (ref 0.0–0.1)
Basophils Relative: 1 %
Eosinophils Absolute: 0.1 10*3/uL (ref 0.0–0.5)
Eosinophils Relative: 3 %
HCT: 31.1 % — ABNORMAL LOW (ref 36.0–46.0)
Hemoglobin: 9.6 g/dL — ABNORMAL LOW (ref 12.0–15.0)
Immature Granulocytes: 0 %
Lymphocytes Relative: 23 %
Lymphs Abs: 0.9 10*3/uL (ref 0.7–4.0)
MCH: 29.1 pg (ref 26.0–34.0)
MCHC: 30.9 g/dL (ref 30.0–36.0)
MCV: 94.2 fL (ref 80.0–100.0)
Monocytes Absolute: 0.6 10*3/uL (ref 0.1–1.0)
Monocytes Relative: 17 %
NRBC: 0 % (ref 0.0–0.2)
Neutro Abs: 2.2 10*3/uL (ref 1.7–7.7)
Neutrophils Relative %: 56 %
PLATELETS: 86 10*3/uL — AB (ref 150–400)
RBC: 3.3 MIL/uL — ABNORMAL LOW (ref 3.87–5.11)
RDW: 19.2 % — ABNORMAL HIGH (ref 11.5–15.5)
WBC: 3.8 10*3/uL — AB (ref 4.0–10.5)

## 2019-01-25 MED ORDER — PROCHLORPERAZINE MALEATE 10 MG PO TABS: 10.0000 mg | ORAL_TABLET | Freq: Once | ORAL | Status: DC

## 2019-01-25 MED ORDER — BORTEZOMIB CHEMO SQ INJECTION 3.5 MG (2.5MG/ML)
1.3000 mg/m2 | Freq: Once | INTRAMUSCULAR | Status: AC
  Administered 2019-01-25: 2.25 mg via SUBCUTANEOUS
  Filled 2019-01-25: qty 0.9

## 2019-01-25 MED ORDER — DEXAMETHASONE 4 MG PO TABS
10.0000 mg | ORAL_TABLET | Freq: Once | ORAL | Status: AC
  Administered 2019-01-25: 10 mg via ORAL
  Filled 2019-01-25: qty 2.5

## 2019-01-25 NOTE — Patient Instructions (Signed)
[facility] Discharge Instructions for Patients Receiving Chemotherapy   Beginning January 23rd 2017 lab work for the Cancer Center will be done in the  Main lab at [place] [facility] on 1st floor. If you have a lab appointment with the Cancer Center please come in thru the  Main Entrance and check in at the main information desk   Today you received the following chemotherapy agents Velcade injection. Follow-up as scheduled. Call clinic for any questions or concerns  To help prevent nausea and vomiting after your treatment, we encourage you to take your nausea medication   If you develop nausea and vomiting, or diarrhea that is not controlled by your medication, call the clinic.  The clinic phone number is [phone]. Office hours are Monday-Friday 8:30am-5:00pm.  BELOW ARE SYMPTOMS THAT SHOULD BE REPORTED IMMEDIATELY:  *FEVER GREATER THAN 101.0 F  *CHILLS WITH OR WITHOUT FEVER  NAUSEA AND VOMITING THAT IS NOT CONTROLLED WITH YOUR NAUSEA MEDICATION  *UNUSUAL SHORTNESS OF BREATH  *UNUSUAL BRUISING OR BLEEDING  TENDERNESS IN MOUTH AND THROAT WITH OR WITHOUT PRESENCE OF ULCERS  *URINARY PROBLEMS  *BOWEL PROBLEMS  UNUSUAL RASH Items with * indicate a potential emergency and should be followed up as soon as possible. If you have an emergency after office hours please contact your primary care physician or go to the nearest emergency department.  Please call the clinic during office hours if you have any questions or concerns.   You may also contact the Patient Navigator at [phone] should you have any questions or need assistance in obtaining follow up care.      Resources For Cancer Patients and their Caregivers ? American Cancer Society: Can assist with transportation, wigs, general needs, runs Look Good Feel Better.        1-888-227-6333 ? Cancer Care: Provides financial assistance, online support groups, medication/co-pay assistance.   1-800-813-HOPE (4673) ? [facility] Assists [place] cancer patients and their families through emotional , educational and financial support.  336-427-4357 ? [place] DSS Where to apply for food stamps, Medicaid and utility assistance. 336-342-1394 ? [facility]: Transportation to medical appointments. 336-347-2287 ? Social Security Administration: May apply for disability if have a Stage IV cancer. 336-342-7796 1-800-772-1213 ? [place] Aging, Disability and Transit Services: Assists with nutrition, care and transit needs. 336-349-2343         

## 2019-01-25 NOTE — Progress Notes (Signed)
1335 Labs, including Platelets of 86, reviewed with Dr. Delton Coombes and pt approved for Velcade injection per MD                                                                                     [facility] Angerer tolerated Velcade injection well without complaints or incident. VSS Pt discharged self ambulatory in satisfactory condition accompanied by family member

## 2019-01-26 MED ORDER — OCTREOTIDE ACETATE 30 MG IM KIT
PACK | INTRAMUSCULAR | Status: AC
  Filled 2019-01-26: qty 1

## 2019-01-26 MED ORDER — FULVESTRANT 250 MG/5ML IM SOLN
INTRAMUSCULAR | Status: AC
  Filled 2019-01-26: qty 5

## 2019-02-01 ENCOUNTER — Inpatient Hospital Stay (HOSPITAL_COMMUNITY): Payer: Medicare PPO

## 2019-02-01 VITALS — BP 104/50 | HR 70 | Temp 98.2°F | Resp 18 | Wt 134.6 lb

## 2019-02-01 DIAGNOSIS — Z5112 Encounter for antineoplastic immunotherapy: Secondary | ICD-10-CM | POA: Diagnosis not present

## 2019-02-01 DIAGNOSIS — C9000 Multiple myeloma not having achieved remission: Secondary | ICD-10-CM | POA: Diagnosis not present

## 2019-02-01 DIAGNOSIS — F0390 Unspecified dementia without behavioral disturbance: Secondary | ICD-10-CM | POA: Diagnosis not present

## 2019-02-01 DIAGNOSIS — Z8572 Personal history of non-Hodgkin lymphomas: Secondary | ICD-10-CM | POA: Diagnosis not present

## 2019-02-01 DIAGNOSIS — E039 Hypothyroidism, unspecified: Secondary | ICD-10-CM | POA: Diagnosis not present

## 2019-02-01 LAB — CBC WITH DIFFERENTIAL/PLATELET
Abs Immature Granulocytes: 0 10*3/uL (ref 0.00–0.07)
Basophils Absolute: 0 10*3/uL (ref 0.0–0.1)
Basophils Relative: 1 %
Eosinophils Absolute: 0.1 10*3/uL (ref 0.0–0.5)
Eosinophils Relative: 3 %
HCT: 31.7 % — ABNORMAL LOW (ref 36.0–46.0)
HEMOGLOBIN: 9.8 g/dL — AB (ref 12.0–15.0)
Immature Granulocytes: 0 %
LYMPHS PCT: 15 %
Lymphs Abs: 0.7 10*3/uL (ref 0.7–4.0)
MCH: 29.1 pg (ref 26.0–34.0)
MCHC: 30.9 g/dL (ref 30.0–36.0)
MCV: 94.1 fL (ref 80.0–100.0)
MONOS PCT: 7 %
Monocytes Absolute: 0.3 10*3/uL (ref 0.1–1.0)
Neutro Abs: 3.4 10*3/uL (ref 1.7–7.7)
Neutrophils Relative %: 74 %
Platelets: 104 10*3/uL — ABNORMAL LOW (ref 150–400)
RBC: 3.37 MIL/uL — ABNORMAL LOW (ref 3.87–5.11)
RDW: 19.3 % — ABNORMAL HIGH (ref 11.5–15.5)
WBC: 4.6 10*3/uL (ref 4.0–10.5)
nRBC: 0 % (ref 0.0–0.2)

## 2019-02-01 LAB — COMPREHENSIVE METABOLIC PANEL
ALK PHOS: 38 U/L (ref 38–126)
ALT: 20 U/L (ref 0–44)
AST: 31 U/L (ref 15–41)
Albumin: 3 g/dL — ABNORMAL LOW (ref 3.5–5.0)
Anion gap: 8 (ref 5–15)
BUN: 20 mg/dL (ref 8–23)
CO2: 26 mmol/L (ref 22–32)
Calcium: 8.8 mg/dL — ABNORMAL LOW (ref 8.9–10.3)
Chloride: 106 mmol/L (ref 98–111)
Creatinine, Ser: 1.29 mg/dL — ABNORMAL HIGH (ref 0.44–1.00)
GFR calc Af Amer: 47 mL/min — ABNORMAL LOW (ref >60)
GFR calc non Af Amer: 41 mL/min — ABNORMAL LOW (ref >60)
Glucose, Bld: 98 mg/dL (ref 70–99)
Potassium: 3.9 mmol/L (ref 3.5–5.1)
Sodium: 140 mmol/L (ref 135–145)
Total Bilirubin: 0.4 mg/dL (ref 0.3–1.2)
Total Protein: 7.5 g/dL (ref 6.5–8.1)

## 2019-02-01 MED ORDER — PROCHLORPERAZINE MALEATE 10 MG PO TABS: 10.0000 mg | ORAL_TABLET | Freq: Once | ORAL | Status: DC

## 2019-02-01 MED ORDER — FULVESTRANT 250 MG/5ML IM SOLN
INTRAMUSCULAR | Status: AC
  Filled 2019-02-01: qty 5

## 2019-02-01 MED ORDER — DEXAMETHASONE 6 MG PO TABS
10.0000 mg | ORAL_TABLET | Freq: Once | ORAL | Status: AC
  Administered 2019-02-01: 10 mg via ORAL
  Filled 2019-02-01: qty 1

## 2019-02-01 MED ORDER — BORTEZOMIB CHEMO SQ INJECTION 3.5 MG (2.5MG/ML)
1.3000 mg/m2 | Freq: Once | INTRAMUSCULAR | Status: AC
  Administered 2019-02-01: 2.25 mg via SUBCUTANEOUS
  Filled 2019-02-01: qty 0.9

## 2019-02-01 NOTE — Patient Instructions (Signed)
[facility] Discharge Instructions for Patients Receiving Chemotherapy   Beginning January 23rd 2017 lab work for the Cancer Center will be done in the  Main lab at  on 1st floor. If you have a lab appointment with the Cancer Center please come in thru the  Main Entrance and check in at the main information desk   Today you received the following chemotherapy agents Velcade  To help prevent nausea and vomiting after your treatment, we encourage you to take your nausea medication    If you develop nausea and vomiting, or diarrhea that is not controlled by your medication, call the clinic.  The clinic phone number is [phone]. Office hours are Monday-Friday 8:30am-5:00pm.  BELOW ARE SYMPTOMS THAT SHOULD BE REPORTED IMMEDIATELY:  *FEVER GREATER THAN 101.0 F  *CHILLS WITH OR WITHOUT FEVER  NAUSEA AND VOMITING THAT IS NOT CONTROLLED WITH YOUR NAUSEA MEDICATION  *UNUSUAL SHORTNESS OF BREATH  *UNUSUAL BRUISING OR BLEEDING  TENDERNESS IN MOUTH AND THROAT WITH OR WITHOUT PRESENCE OF ULCERS  *URINARY PROBLEMS  *BOWEL PROBLEMS  UNUSUAL RASH Items with * indicate a potential emergency and should be followed up as soon as possible. If you have an emergency after office hours please contact your primary care physician or go to the nearest emergency department.  Please call the clinic during office hours if you have any questions or concerns.   You may also contact the Patient Navigator at [phone] should you have any questions or need assistance in obtaining follow up care.      Resources For Cancer Patients and their Caregivers ? American Cancer Society: Can assist with transportation, wigs, general needs, runs Look Good Feel Better.        1-888-227-6333 ? Cancer Care: Provides financial assistance, online support groups, medication/co-pay assistance.  1-800-813-HOPE (4673) ? [facility] Assists [place] cancer  patients and their families through emotional , educational and financial support.  336-427-4357 ? [place] DSS Where to apply for food stamps, Medicaid and utility assistance. 336-342-1394 ? [facility]: Transportation to medical appointments. 336-347-2287 ? Social Security Administration: May apply for disability if have a Stage IV cancer. 336-342-7796 1-800-772-1213 ? [place] Aging, Disability and Transit Services: Assists with nutrition, care and transit needs. 336-349-2343          

## 2019-02-01 NOTE — Progress Notes (Signed)
1130 labs reviewed and pt approved for Velcade injection today.   Amanda Wu tolerated Velcade injection without incident or complaint. VSS. Discharged self ambulatory in satisfactory condition.

## 2019-02-02 MED ORDER — OCTREOTIDE ACETATE 30 MG IM KIT
PACK | INTRAMUSCULAR | Status: AC
  Filled 2019-02-02: qty 1

## 2019-02-03 MED ORDER — FULVESTRANT 250 MG/5ML IM SOLN
INTRAMUSCULAR | Status: AC
  Filled 2019-02-03: qty 5

## 2019-02-15 ENCOUNTER — Inpatient Hospital Stay (HOSPITAL_COMMUNITY): Payer: Medicare PPO

## 2019-02-15 ENCOUNTER — Other Ambulatory Visit (HOSPITAL_COMMUNITY): Payer: Self-pay | Admitting: *Deleted

## 2019-02-15 ENCOUNTER — Encounter (HOSPITAL_COMMUNITY): Payer: Self-pay

## 2019-02-15 ENCOUNTER — Other Ambulatory Visit: Payer: Self-pay

## 2019-02-15 ENCOUNTER — Inpatient Hospital Stay (HOSPITAL_COMMUNITY): Payer: Medicare PPO | Attending: Hematology

## 2019-02-15 VITALS — BP 120/73 | HR 71 | Temp 97.6°F | Resp 18 | Wt 136.6 lb

## 2019-02-15 DIAGNOSIS — C9000 Multiple myeloma not having achieved remission: Secondary | ICD-10-CM

## 2019-02-15 DIAGNOSIS — Z5112 Encounter for antineoplastic immunotherapy: Secondary | ICD-10-CM | POA: Insufficient documentation

## 2019-02-15 LAB — CBC WITH DIFFERENTIAL/PLATELET
Abs Immature Granulocytes: 0 10*3/uL (ref 0.00–0.07)
Basophils Absolute: 0 10*3/uL (ref 0.0–0.1)
Basophils Relative: 0 %
EOS ABS: 0.2 10*3/uL (ref 0.0–0.5)
Eosinophils Relative: 4 %
HCT: 30.6 % — ABNORMAL LOW (ref 36.0–46.0)
Hemoglobin: 9.3 g/dL — ABNORMAL LOW (ref 12.0–15.0)
IMMATURE GRANULOCYTES: 0 %
Lymphocytes Relative: 20 %
Lymphs Abs: 1 10*3/uL (ref 0.7–4.0)
MCH: 28.8 pg (ref 26.0–34.0)
MCHC: 30.4 g/dL (ref 30.0–36.0)
MCV: 94.7 fL (ref 80.0–100.0)
Monocytes Absolute: 0.5 10*3/uL (ref 0.1–1.0)
Monocytes Relative: 11 %
Neutro Abs: 3.3 10*3/uL (ref 1.7–7.7)
Neutrophils Relative %: 65 %
Platelets: 158 10*3/uL (ref 150–400)
RBC: 3.23 MIL/uL — ABNORMAL LOW (ref 3.87–5.11)
RDW: 18.6 % — AB (ref 11.5–15.5)
WBC: 4.9 10*3/uL (ref 4.0–10.5)
nRBC: 0 % (ref 0.0–0.2)

## 2019-02-15 LAB — COMPREHENSIVE METABOLIC PANEL
ALT: 25 U/L (ref 0–44)
AST: 31 U/L (ref 15–41)
Albumin: 3 g/dL — ABNORMAL LOW (ref 3.5–5.0)
Alkaline Phosphatase: 57 U/L (ref 38–126)
Anion gap: 6 (ref 5–15)
BUN: 18 mg/dL (ref 8–23)
CO2: 25 mmol/L (ref 22–32)
Calcium: 8.8 mg/dL — ABNORMAL LOW (ref 8.9–10.3)
Chloride: 109 mmol/L (ref 98–111)
Creatinine, Ser: 1.22 mg/dL — ABNORMAL HIGH (ref 0.44–1.00)
GFR calc Af Amer: 51 mL/min — ABNORMAL LOW (ref >60)
GFR calc non Af Amer: 44 mL/min — ABNORMAL LOW (ref >60)
Glucose, Bld: 77 mg/dL (ref 70–99)
Potassium: 3.9 mmol/L (ref 3.5–5.1)
Sodium: 140 mmol/L (ref 135–145)
Total Bilirubin: 0.4 mg/dL (ref 0.3–1.2)
Total Protein: 7.6 g/dL (ref 6.5–8.1)

## 2019-02-15 MED ORDER — BORTEZOMIB CHEMO SQ INJECTION 3.5 MG (2.5MG/ML)
1.3000 mg/m2 | Freq: Once | INTRAMUSCULAR | Status: AC
  Administered 2019-02-15: 2.25 mg via SUBCUTANEOUS
  Filled 2019-02-15: qty 0.9

## 2019-02-15 MED ORDER — LENALIDOMIDE 10 MG PO CAPS: ORAL_CAPSULE | ORAL | 1 refills | Status: DC

## 2019-02-15 MED ORDER — PROCHLORPERAZINE MALEATE 10 MG PO TABS: 10.0000 mg | ORAL_TABLET | Freq: Once | ORAL | Status: DC

## 2019-02-15 MED ORDER — DEXAMETHASONE 4 MG PO TABS
10.0000 mg | ORAL_TABLET | Freq: Once | ORAL | Status: AC
  Administered 2019-02-15: 10 mg via ORAL
  Filled 2019-02-15: qty 2.5

## 2019-02-15 NOTE — Progress Notes (Signed)
Pt here for Velcade injection. VSS. Pt has no complaints of any pain or changes since the last visit. MAR updated and reviewed. Pt states she is taking Revlimid as directed by the physician.   Velcade administered today per MD orders. Tolerated without adverse affects. Vital signs stable. No complaints at this time. Discharged from clinic ambulatory. F/U with [facility] as scheduled.

## 2019-02-15 NOTE — Patient Instructions (Signed)
[facility] Discharge Instructions for Patients Receiving Chemotherapy  Today you received the following chemotherapy agents   To help prevent nausea and vomiting after your treatment, we encourage you to take your nausea medication   If you develop nausea and vomiting that is not controlled by your nausea medication, call the clinic.   BELOW ARE SYMPTOMS THAT SHOULD BE REPORTED IMMEDIATELY:  *FEVER GREATER THAN 100.5 F  *CHILLS WITH OR WITHOUT FEVER  NAUSEA AND VOMITING THAT IS NOT CONTROLLED WITH YOUR NAUSEA MEDICATION  *UNUSUAL SHORTNESS OF BREATH  *UNUSUAL BRUISING OR BLEEDING  TENDERNESS IN MOUTH AND THROAT WITH OR WITHOUT PRESENCE OF ULCERS  *URINARY PROBLEMS  *BOWEL PROBLEMS  UNUSUAL RASH Items with * indicate a potential emergency and should be followed up as soon as possible.  Feel free to call the clinic should you have any questions or concerns. The clinic phone number is [phone].  Please show the CHEMO ALERT CARD at check-in to the Emergency Department and triage nurse.   

## 2019-02-15 NOTE — Telephone Encounter (Signed)
Chart reviewed, revlimid refilled. 

## 2019-02-22 ENCOUNTER — Inpatient Hospital Stay (HOSPITAL_COMMUNITY): Payer: Medicare PPO

## 2019-02-22 ENCOUNTER — Encounter (HOSPITAL_COMMUNITY): Payer: Self-pay

## 2019-02-22 ENCOUNTER — Other Ambulatory Visit: Payer: Self-pay

## 2019-02-22 VITALS — BP 101/50 | HR 68 | Temp 98.9°F | Resp 18 | Wt 133.8 lb

## 2019-02-22 DIAGNOSIS — C9000 Multiple myeloma not having achieved remission: Secondary | ICD-10-CM | POA: Diagnosis not present

## 2019-02-22 DIAGNOSIS — Z5112 Encounter for antineoplastic immunotherapy: Secondary | ICD-10-CM | POA: Diagnosis not present

## 2019-02-22 LAB — CBC WITH DIFFERENTIAL/PLATELET
Abs Immature Granulocytes: 0.01 10*3/uL (ref 0.00–0.07)
Basophils Absolute: 0 10*3/uL (ref 0.0–0.1)
Basophils Relative: 1 %
Eosinophils Absolute: 0.2 10*3/uL (ref 0.0–0.5)
Eosinophils Relative: 4 %
HCT: 29.5 % — ABNORMAL LOW (ref 36.0–46.0)
Hemoglobin: 9.5 g/dL — ABNORMAL LOW (ref 12.0–15.0)
Immature Granulocytes: 0 %
Lymphocytes Relative: 18 %
Lymphs Abs: 0.7 10*3/uL (ref 0.7–4.0)
MCH: 30.3 pg (ref 26.0–34.0)
MCHC: 32.2 g/dL (ref 30.0–36.0)
MCV: 93.9 fL (ref 80.0–100.0)
Monocytes Absolute: 0.5 10*3/uL (ref 0.1–1.0)
Monocytes Relative: 12 %
Neutro Abs: 2.3 10*3/uL (ref 1.7–7.7)
Neutrophils Relative %: 65 %
PLATELETS: 108 10*3/uL — AB (ref 150–400)
RBC: 3.14 MIL/uL — AB (ref 3.87–5.11)
RDW: 19.2 % — ABNORMAL HIGH (ref 11.5–15.5)
WBC: 3.6 10*3/uL — ABNORMAL LOW (ref 4.0–10.5)
nRBC: 0 % (ref 0.0–0.2)

## 2019-02-22 LAB — COMPREHENSIVE METABOLIC PANEL
ALT: 18 U/L (ref 0–44)
AST: 28 U/L (ref 15–41)
Albumin: 3 g/dL — ABNORMAL LOW (ref 3.5–5.0)
Alkaline Phosphatase: 46 U/L (ref 38–126)
Anion gap: 7 (ref 5–15)
BUN: 16 mg/dL (ref 8–23)
CHLORIDE: 108 mmol/L (ref 98–111)
CO2: 26 mmol/L (ref 22–32)
Calcium: 9 mg/dL (ref 8.9–10.3)
Creatinine, Ser: 1.25 mg/dL — ABNORMAL HIGH (ref 0.44–1.00)
GFR calc Af Amer: 49 mL/min — ABNORMAL LOW (ref >60)
GFR calc non Af Amer: 42 mL/min — ABNORMAL LOW (ref >60)
Glucose, Bld: 126 mg/dL — ABNORMAL HIGH (ref 70–99)
POTASSIUM: 3.8 mmol/L (ref 3.5–5.1)
Sodium: 141 mmol/L (ref 135–145)
Total Bilirubin: 0.4 mg/dL (ref 0.3–1.2)
Total Protein: 7.4 g/dL (ref 6.5–8.1)

## 2019-02-22 MED ORDER — BORTEZOMIB CHEMO SQ INJECTION 3.5 MG (2.5MG/ML)
1.3000 mg/m2 | Freq: Once | INTRAMUSCULAR | Status: AC
  Administered 2019-02-22: 2.25 mg via SUBCUTANEOUS
  Filled 2019-02-22: qty 0.9

## 2019-02-22 MED ORDER — PROCHLORPERAZINE MALEATE 10 MG PO TABS: 10.0000 mg | ORAL_TABLET | Freq: Once | ORAL | Status: DC

## 2019-02-22 MED ORDER — DEXAMETHASONE 4 MG PO TABS
10.0000 mg | ORAL_TABLET | Freq: Once | ORAL | Status: AC
  Administered 2019-02-22: 10 mg via ORAL
  Filled 2019-02-22: qty 2.5

## 2019-02-22 NOTE — Progress Notes (Signed)
Amanda Wu tolerated Velcade injection well without complaints or incident. Labs reviewed prior to administering this medication. VSS Pt discharged self ambulatory in satisfactory condition accompanied by family member

## 2019-02-22 NOTE — Patient Instructions (Signed)
[facility] Discharge Instructions for Patients Receiving Chemotherapy   Beginning January 23rd 2017 lab work for the Cancer Center will be done in the  Main lab at  on 1st floor. If you have a lab appointment with the Cancer Center please come in thru the  Main Entrance and check in at the main information desk   Today you received the following chemotherapy agents Velcade injection. Follow-up as scheduled. Call clinic for any questions or concerns  To help prevent nausea and vomiting after your treatment, we encourage you to take your nausea medication   If you develop nausea and vomiting, or diarrhea that is not controlled by your medication, call the clinic.  The clinic phone number is [phone]. Office hours are Monday-Friday 8:30am-5:00pm.  BELOW ARE SYMPTOMS THAT SHOULD BE REPORTED IMMEDIATELY:  *FEVER GREATER THAN 101.0 F  *CHILLS WITH OR WITHOUT FEVER  NAUSEA AND VOMITING THAT IS NOT CONTROLLED WITH YOUR NAUSEA MEDICATION  *UNUSUAL SHORTNESS OF BREATH  *UNUSUAL BRUISING OR BLEEDING  TENDERNESS IN MOUTH AND THROAT WITH OR WITHOUT PRESENCE OF ULCERS  *URINARY PROBLEMS  *BOWEL PROBLEMS  UNUSUAL RASH Items with * indicate a potential emergency and should be followed up as soon as possible. If you have an emergency after office hours please contact your primary care physician or go to the nearest emergency department.  Please call the clinic during office hours if you have any questions or concerns.   You may also contact the Patient Navigator at [phone] should you have any questions or need assistance in obtaining follow up care.      Resources For Cancer Patients and their Caregivers ? American Cancer Society: Can assist with transportation, wigs, general needs, runs Look Good Feel Better.        1-888-227-6333 ? Cancer Care: Provides financial assistance, online support groups, medication/co-pay assistance.   1-800-813-HOPE (4673) ? [facility] Assists [place] cancer patients and their families through emotional , educational and financial support.  336-427-4357 ? [place] DSS Where to apply for food stamps, Medicaid and utility assistance. 336-342-1394 ? [facility]: Transportation to medical appointments. 336-347-2287 ? Social Security Administration: May apply for disability if have a Stage IV cancer. 336-342-7796 1-800-772-1213 ? [place] Aging, Disability and Transit Services: Assists with nutrition, care and transit needs. 336-349-2343         

## 2019-03-01 ENCOUNTER — Inpatient Hospital Stay (HOSPITAL_COMMUNITY): Payer: Medicare PPO

## 2019-03-01 ENCOUNTER — Other Ambulatory Visit: Payer: Self-pay

## 2019-03-01 ENCOUNTER — Encounter (HOSPITAL_COMMUNITY): Payer: Self-pay

## 2019-03-01 VITALS — BP 108/48 | HR 69 | Temp 98.5°F | Resp 18 | Wt 132.3 lb

## 2019-03-01 DIAGNOSIS — C9000 Multiple myeloma not having achieved remission: Secondary | ICD-10-CM

## 2019-03-01 DIAGNOSIS — Z5112 Encounter for antineoplastic immunotherapy: Secondary | ICD-10-CM | POA: Diagnosis not present

## 2019-03-01 LAB — CBC WITH DIFFERENTIAL/PLATELET
Abs Immature Granulocytes: 0.01 10*3/uL (ref 0.00–0.07)
Basophils Absolute: 0 10*3/uL (ref 0.0–0.1)
Basophils Relative: 0 %
EOS ABS: 0.1 10*3/uL (ref 0.0–0.5)
Eosinophils Relative: 3 %
HCT: 30.3 % — ABNORMAL LOW (ref 36.0–46.0)
Hemoglobin: 9.7 g/dL — ABNORMAL LOW (ref 12.0–15.0)
Immature Granulocytes: 0 %
Lymphocytes Relative: 15 %
Lymphs Abs: 0.8 10*3/uL (ref 0.7–4.0)
MCH: 30.4 pg (ref 26.0–34.0)
MCHC: 32 g/dL (ref 30.0–36.0)
MCV: 95 fL (ref 80.0–100.0)
Monocytes Absolute: 0.3 10*3/uL (ref 0.1–1.0)
Monocytes Relative: 6 %
NRBC: 0 % (ref 0.0–0.2)
Neutro Abs: 3.8 10*3/uL (ref 1.7–7.7)
Neutrophils Relative %: 76 %
Platelets: 104 10*3/uL — ABNORMAL LOW (ref 150–400)
RBC: 3.19 MIL/uL — ABNORMAL LOW (ref 3.87–5.11)
RDW: 19.4 % — AB (ref 11.5–15.5)
WBC: 5 10*3/uL (ref 4.0–10.5)

## 2019-03-01 LAB — COMPREHENSIVE METABOLIC PANEL
ALT: 18 U/L (ref 0–44)
AST: 31 U/L (ref 15–41)
Albumin: 3 g/dL — ABNORMAL LOW (ref 3.5–5.0)
Alkaline Phosphatase: 44 U/L (ref 38–126)
Anion gap: 8 (ref 5–15)
BILIRUBIN TOTAL: 0.2 mg/dL — AB (ref 0.3–1.2)
BUN: 19 mg/dL (ref 8–23)
CO2: 24 mmol/L (ref 22–32)
Calcium: 8.7 mg/dL — ABNORMAL LOW (ref 8.9–10.3)
Chloride: 106 mmol/L (ref 98–111)
Creatinine, Ser: 1.13 mg/dL — ABNORMAL HIGH (ref 0.44–1.00)
GFR calc Af Amer: 55 mL/min — ABNORMAL LOW (ref >60)
GFR calc non Af Amer: 48 mL/min — ABNORMAL LOW (ref >60)
Glucose, Bld: 168 mg/dL — ABNORMAL HIGH (ref 70–99)
POTASSIUM: 3.6 mmol/L (ref 3.5–5.1)
Sodium: 138 mmol/L (ref 135–145)
TOTAL PROTEIN: 7.4 g/dL (ref 6.5–8.1)

## 2019-03-01 MED ORDER — PROCHLORPERAZINE MALEATE 10 MG PO TABS
10.0000 mg | ORAL_TABLET | Freq: Once | ORAL | Status: DC
  Filled 2019-03-01: qty 1

## 2019-03-01 MED ORDER — DEXAMETHASONE 4 MG PO TABS
10.0000 mg | ORAL_TABLET | Freq: Once | ORAL | Status: AC
  Administered 2019-03-01: 10 mg via ORAL
  Filled 2019-03-01: qty 3

## 2019-03-01 MED ORDER — BORTEZOMIB CHEMO SQ INJECTION 3.5 MG (2.5MG/ML)
1.3000 mg/m2 | Freq: Once | INTRAMUSCULAR | Status: AC
  Administered 2019-03-01: 2.25 mg via SUBCUTANEOUS
  Filled 2019-03-01: qty 0.9

## 2019-03-01 NOTE — Progress Notes (Signed)
To treatment room for velcade. Patient denied fevers, chills, and body aches.  No s/s of distress noted.    Patient tolerated injection with no complaints voiced.  Site clean and dry with no bruising or swelling noted at site.  Band aid applied.  Vss with discharge and left ambulatory with no s/s of distress noted.

## 2019-03-01 NOTE — Patient Instructions (Signed)
[facility] at [facility]  Discharge Instructions:   _______________________________________________________________  Thank you for choosing [facility] at [facility] to provide your oncology and hematology care.  To afford each patient quality time with our providers, please arrive at least 15 minutes before your scheduled appointment.  You need to re-schedule your appointment if you arrive 10 or more minutes late.  We strive to give you quality time with our providers, and arriving late affects you and other patients whose appointments are after yours.  Also, if you no show three or more times for appointments you may be dismissed from the clinic.  Again, thank you for choosing [facility] at [facility]. Our hope is that these requests will allow you access to exceptional care and in a timely manner. _______________________________________________________________  If you have questions after your visit, please contact our office at [phone] between the hours of 8:30 a.m. and 5:00 p.m. Voicemails left after 4:30 p.m. will not be returned until the following business day. _______________________________________________________________  For prescription refill requests, have your pharmacy contact our office. _______________________________________________________________  Recommendations made by the consultant and any test results will be sent to your referring physician. _______________________________________________________________ 

## 2019-03-03 MED ORDER — DENOSUMAB 60 MG/ML ~~LOC~~ SOSY
PREFILLED_SYRINGE | SUBCUTANEOUS | Status: AC
  Filled 2019-03-03: qty 1

## 2019-03-03 MED ORDER — CYANOCOBALAMIN 1000 MCG/ML IJ SOLN
INTRAMUSCULAR | Status: AC
  Filled 2019-03-03: qty 1

## 2019-03-04 MED ORDER — HEPARIN SOD (PORK) LOCK FLUSH 100 UNIT/ML IV SOLN
INTRAVENOUS | Status: AC
  Filled 2019-03-04: qty 5

## 2019-03-08 ENCOUNTER — Other Ambulatory Visit (HOSPITAL_COMMUNITY): Payer: Self-pay | Admitting: *Deleted

## 2019-03-08 DIAGNOSIS — C9000 Multiple myeloma not having achieved remission: Secondary | ICD-10-CM

## 2019-03-08 MED ORDER — LENALIDOMIDE 10 MG PO CAPS: ORAL_CAPSULE | ORAL | 1 refills | Status: DC

## 2019-03-14 ENCOUNTER — Other Ambulatory Visit: Payer: Self-pay

## 2019-03-15 ENCOUNTER — Inpatient Hospital Stay (HOSPITAL_COMMUNITY): Payer: Medicare PPO | Attending: Hematology | Admitting: Hematology

## 2019-03-15 ENCOUNTER — Encounter (HOSPITAL_COMMUNITY): Payer: Self-pay | Admitting: Hematology

## 2019-03-15 ENCOUNTER — Inpatient Hospital Stay (HOSPITAL_COMMUNITY): Payer: Medicare PPO

## 2019-03-15 ENCOUNTER — Other Ambulatory Visit: Payer: Self-pay

## 2019-03-15 ENCOUNTER — Inpatient Hospital Stay (HOSPITAL_COMMUNITY): Payer: Medicare PPO | Attending: Hematology

## 2019-03-15 DIAGNOSIS — C9000 Multiple myeloma not having achieved remission: Secondary | ICD-10-CM | POA: Insufficient documentation

## 2019-03-15 DIAGNOSIS — Z5112 Encounter for antineoplastic immunotherapy: Secondary | ICD-10-CM | POA: Insufficient documentation

## 2019-03-15 DIAGNOSIS — Z8572 Personal history of non-Hodgkin lymphomas: Secondary | ICD-10-CM

## 2019-03-15 LAB — CBC WITH DIFFERENTIAL/PLATELET
Abs Immature Granulocytes: 0 10*3/uL (ref 0.00–0.07)
BASOS PCT: 1 %
Basophils Absolute: 0.1 10*3/uL (ref 0.0–0.1)
Eosinophils Absolute: 0.3 10*3/uL (ref 0.0–0.5)
Eosinophils Relative: 7 %
HCT: 31 % — ABNORMAL LOW (ref 36.0–46.0)
Hemoglobin: 9.8 g/dL — ABNORMAL LOW (ref 12.0–15.0)
Immature Granulocytes: 0 %
Lymphocytes Relative: 19 %
Lymphs Abs: 0.9 10*3/uL (ref 0.7–4.0)
MCH: 30.2 pg (ref 26.0–34.0)
MCHC: 31.6 g/dL (ref 30.0–36.0)
MCV: 95.4 fL (ref 80.0–100.0)
MONOS PCT: 9 %
Monocytes Absolute: 0.4 10*3/uL (ref 0.1–1.0)
NEUTROS PCT: 64 %
Neutro Abs: 2.8 10*3/uL (ref 1.7–7.7)
Platelets: 112 10*3/uL — ABNORMAL LOW (ref 150–400)
RBC: 3.25 MIL/uL — ABNORMAL LOW (ref 3.87–5.11)
RDW: 19.4 % — ABNORMAL HIGH (ref 11.5–15.5)
WBC: 4.4 10*3/uL (ref 4.0–10.5)
nRBC: 0 % (ref 0.0–0.2)

## 2019-03-15 LAB — COMPREHENSIVE METABOLIC PANEL
ALT: 19 U/L (ref 0–44)
AST: 31 U/L (ref 15–41)
Albumin: 3 g/dL — ABNORMAL LOW (ref 3.5–5.0)
Alkaline Phosphatase: 53 U/L (ref 38–126)
Anion gap: 9 (ref 5–15)
BUN: 17 mg/dL (ref 8–23)
CHLORIDE: 107 mmol/L (ref 98–111)
CO2: 24 mmol/L (ref 22–32)
CREATININE: 1.3 mg/dL — AB (ref 0.44–1.00)
Calcium: 8.8 mg/dL — ABNORMAL LOW (ref 8.9–10.3)
GFR calc Af Amer: 47 mL/min — ABNORMAL LOW (ref >60)
GFR, EST NON AFRICAN AMERICAN: 40 mL/min — AB (ref >60)
Glucose, Bld: 132 mg/dL — ABNORMAL HIGH (ref 70–99)
Potassium: 3.6 mmol/L (ref 3.5–5.1)
Sodium: 140 mmol/L (ref 135–145)
Total Bilirubin: 0.5 mg/dL (ref 0.3–1.2)
Total Protein: 7.5 g/dL (ref 6.5–8.1)

## 2019-03-15 LAB — LACTATE DEHYDROGENASE: LDH: 162 U/L (ref 98–192)

## 2019-03-15 MED ORDER — DEXAMETHASONE 4 MG PO TABS
10.0000 mg | ORAL_TABLET | Freq: Once | ORAL | Status: AC
  Administered 2019-03-15: 10 mg via ORAL

## 2019-03-15 MED ORDER — DEXAMETHASONE 4 MG PO TABS
ORAL_TABLET | ORAL | Status: AC
  Filled 2019-03-15: qty 3

## 2019-03-15 MED ORDER — BORTEZOMIB CHEMO SQ INJECTION 3.5 MG (2.5MG/ML)
1.3000 mg/m2 | Freq: Once | INTRAMUSCULAR | Status: AC
  Administered 2019-03-15: 2.25 mg via SUBCUTANEOUS
  Filled 2019-03-15: qty 0.9

## 2019-03-15 MED ORDER — PROCHLORPERAZINE MALEATE 10 MG PO TABS: 10.0000 mg | ORAL_TABLET | Freq: Once | ORAL | Status: DC

## 2019-03-15 NOTE — Assessment & Plan Note (Addendum)
1.  IgG lambda multiple myeloma: - Revlimid 19m and dexamethasone 3 weeks on 1 week off started in September 2009, changed to 2 weeks on 2 weeks off in November 2010, changed to 2 weeks on 3 weeks of in May 2012, Revlimid 15 mg 14/35 days and weekly Dex in January 2015, Revlimid 15 mg change to 14/42 days and weekly Dex in June 2017, dexamethasone stopped in November 2017 - Velcade and dexamethasone weekly, 3 weeks on/1 week off started recently -Dr. LMaylon Peppershas called me to treat this patient locally so that she can follow-up with him periodically.  -She received Zometa monthly starting September 2009, switch to every 3 months in 2012 and lately off of it. - Velcade 1 mg/m, 3 weeks on 1 week off was started on 06/01/2018.  She takes dexamethasone 10 mg on days of Velcade.  -Revlimid 2 weeks on, 2 weeks off started on 08/14/2018 as M spike and light chains were not not improving.   - Velcade dose was increased to 1.3 mg per metered square on 12/16/2018. -We discussed the results of myeloma panel from 01/10/2019 with M spike of 1.9 g/dL.  Free light chain ratio is also stable. -She is tolerating Velcade without any side effects.  Denies any neuropathy. - We will continue the same regimen at this time.  We will see her back.  2.  CNS MALT like lymphoma: -Treated with chemotherapy and radiation, last brain MRI was in 2013, CT of the brain in 2018 with no evidence of recurrence.  She does have dementia.  She will continue Aricept 10 mg daily.

## 2019-03-15 NOTE — Patient Instructions (Signed)
[facility] Discharge Instructions for Patients Receiving Chemotherapy   Beginning January 23rd 2017 lab work for the Cancer Center will be done in the  Main lab at [place] on 1st floor. If you have a lab appointment with the Cancer Center please come in thru the  Main Entrance and check in at the main information desk   Today you received the following chemotherapy agents Velcade  To help prevent nausea and vomiting after your treatment, we encourage you to take your nausea medication    If you develop nausea and vomiting, or diarrhea that is not controlled by your medication, call the clinic.  The clinic phone number is [phone]. Office hours are Monday-Friday 8:30am-5:00pm.  BELOW ARE SYMPTOMS THAT SHOULD BE REPORTED IMMEDIATELY:  *FEVER GREATER THAN 101.0 F  *CHILLS WITH OR WITHOUT FEVER  NAUSEA AND VOMITING THAT IS NOT CONTROLLED WITH YOUR NAUSEA MEDICATION  *UNUSUAL SHORTNESS OF BREATH  *UNUSUAL BRUISING OR BLEEDING  TENDERNESS IN MOUTH AND THROAT WITH OR WITHOUT PRESENCE OF ULCERS  *URINARY PROBLEMS  *BOWEL PROBLEMS  UNUSUAL RASH Items with * indicate a potential emergency and should be followed up as soon as possible. If you have an emergency after office hours please contact your primary care physician or go to the nearest emergency department.  Please call the clinic during office hours if you have any questions or concerns.   You may also contact the Patient Navigator at [phone] should you have any questions or need assistance in obtaining follow up care.      Resources For Cancer Patients and their Caregivers ? American Cancer Society: Can assist with transportation, wigs, general needs, runs Look Good Feel Better.        1-888-227-6333 ? Cancer Care: Provides financial assistance, online support groups, medication/co-pay assistance.  1-800-813-HOPE (4673) ? [facility] Assists [place] cancer  patients and their families through emotional , educational and financial support.  336-427-4357 ? [place] DSS Where to apply for food stamps, Medicaid and utility assistance. 336-342-1394 ? RCATS: Transportation to medical appointments. 336-347-2287 ? Social Security Administration: May apply for disability if have a Stage IV cancer. 336-342-7796 1-800-772-1213 ? [place] Aging, Disability and Transit Services: Assists with nutrition, care and transit needs. 336-349-2343          

## 2019-03-15 NOTE — Progress Notes (Signed)
[address], [place] [address]   CLINIC:  Medical Oncology/Hematology  PCP:  Monico Blitz, MD [place] [address] [phone]   REASON FOR VISIT:  Follow-up for multiple myeloma  CURRENT THERAPY:Velcade, Revlimid, and dexamethasone   BRIEF ONCOLOGIC HISTORY:    Multiple myeloma ([place])   07/09/2017 Initial Diagnosis    Multiple myeloma ([place])    06/01/2018 -  Chemotherapy    The patient had bortezomib SQ (VELCADE) chemo injection 1.75 mg, 1 mg/m2 = 1.75 mg (100 % of original dose 1 mg/m2), Subcutaneous,  Once, 11 of 13 cycles Dose modification: 1 mg/m2 (original dose 1 mg/m2, Cycle 1, Reason: Provider Judgment), 1.3 mg/m2 (original dose 1 mg/m2, Cycle 8, Reason: Provider Judgment) Administration: 1.75 mg (06/01/2018), 1.75 mg (06/08/2018), 1.75 mg (06/15/2018), 1.75 mg (06/29/2018), 1.75 mg (07/06/2018), 1.75 mg (07/13/2018), 1.75 mg (07/27/2018), 1.75 mg (08/03/2018), 1.75 mg (08/10/2018), 1.75 mg (08/24/2018), 1.75 mg (08/31/2018), 1.75 mg (09/07/2018), 1.75 mg (09/21/2018), 1.75 mg (09/28/2018), 1.75 mg (10/05/2018), 1.75 mg (10/19/2018), 1.75 mg (10/26/2018), 1.75 mg (11/02/2018), 1.75 mg (11/16/2018), 1.75 mg (11/23/2018), 1.75 mg (11/30/2018), 2.25 mg (12/17/2018), 2.25 mg (12/24/2018), 2.25 mg (01/04/2019), 2.25 mg (01/18/2019), 2.25 mg (01/25/2019), 2.25 mg (02/01/2019), 2.25 mg (02/15/2019), 2.25 mg (02/22/2019), 2.25 mg (03/01/2019), 2.25 mg (03/15/2019)  for chemotherapy treatment.       CANCER STAGING: Cancer Staging No matching staging information was found for the patient.   INTERVAL HISTORY:  Ms. Breault 73 y.o. female returns for routine follow-up. She is here today alone. She states that she is currently taking Revlimid as prescribed with no missed doses. She states that she has been feeling well since her last visit. She states that she is eating well. Denies any nausea, vomiting, or diarrhea. Denies any new pains. Had not noticed any recent  bleeding such as epistaxis, hematuria or hematochezia. Denies recent chest pain on exertion, shortness of breath on minimal exertion, pre-syncopal episodes, or palpitations. Denies any numbness or tingling in hands or feet. Denies any recent fevers, infections, or recent hospitalizations. Patient reports appetite at 100% and energy level at 100%.    REVIEW OF SYSTEMS:  Review of Systems  All other systems reviewed and are negative.    PAST MEDICAL/SURGICAL HISTORY:  Past Medical History:  Diagnosis Date  . Benign hypertension   . Cataract   . Central nervous system lymphoma ([place])   . Diabetes mellitus without complication ([place])   . H/O partial nephrectomy   . Hypokalemia   . Hypothyroidism   . Impaired cognition   . Multiple myeloma (HCC)    Dr Maylon Peppers, [facility]  . Thyroid disease    Past Surgical History:  Procedure Laterality Date  . ABDOMINAL HYSTERECTOMY    . CRANIOTOMY     for lymphoma  . YAG LASER APPLICATION Right 9/82/6415   Procedure: YAG LASER APPLICATION;  Surgeon: [name], MD;  Location: [facility];  Service: Ophthalmology;  Laterality: Right;     SOCIAL HISTORY:  Social History   Socioeconomic History  . Marital status: Single    Spouse name: Not on file  . Number of children: Not on file  . Years of education: Not on file  . Highest education level: Not on file  Occupational History  . Not on file  Social Needs  . Financial resource strain: Not on file  . Food insecurity:    Worry: Not on file    Inability: Not on file  .  Transportation needs:    Medical: Not on file    Non-medical: Not on file  Tobacco Use  . Smoking status: Never Smoker  . Smokeless tobacco: Never Used  Substance and Sexual Activity  . Alcohol use: No  . Drug use: No  . Sexual activity: Not on file  Lifestyle  . Physical activity:    Days per week: Not on file    Minutes per session: Not on file  . Stress: Not on file  Relationships  . Social connections:    Talks  on phone: Not on file    Gets together: Not on file    Attends religious service: Not on file    Active member of club or organization: Not on file    Attends meetings of clubs or organizations: Not on file    Relationship status: Not on file  . Intimate partner violence:    Fear of current or ex partner: Not on file    Emotionally abused: Not on file    Physically abused: Not on file    Forced sexual activity: Not on file  Other Topics Concern  . Not on file  Social History Narrative  . Not on file    FAMILY HISTORY:  Family History  Problem Relation Age of Onset  . Depression Mother   . Diabetes Mother   . Heart disease Father   . Cancer - Prostate Brother   . Cancer Brother   . Cancer Sister     CURRENT MEDICATIONS:  Outpatient Encounter Medications as of 03/15/2019  Medication Sig Note  . ACCU-CHEK SMARTVIEW test strip    . acetaminophen (TYLENOL) 500 MG tablet Take 1,000 mg by mouth every 6 (six) hours as needed for mild pain.    Marland Kitchen acyclovir (ZOVIRAX) 400 MG tablet TAKE ONE TABLET BY MOUTH TWICE DAILY   . amLODipine (NORVASC) 5 MG tablet Take 5 mg by mouth daily.    Marland Kitchen aspirin EC 81 MG tablet Take 81 mg by mouth daily.   . bortezomib IV (VELCADE) 3.5 MG injection Inject into the vein.   . calcium-vitamin D (OSCAL WITH D) 500-200 MG-UNIT tablet Take 1 tablet by mouth daily with breakfast.   . dexamethasone (DECADRON) 4 MG tablet TAKE TWO AND ONE-HALF TABLETS BY MOUTH WEEKLY FOR THREE WEEKS (THREE WEEKS ON, ONE WEEK OFF) IN A ROW AND REPEAT MONTHLY 06/22/2018: Patient states that she is given the dexamethasone while here getting her velcade injection  . donepezil (ARICEPT) 10 MG tablet TAKE 1 TABLET BY MOUTH every evening   . ferrous sulfate 325 (65 FE) MG tablet Take 325 mg by mouth daily with breakfast.   . lenalidomide (REVLIMID) 10 MG capsule Take 1 tablet (10 mg) daily by mouth for 14 days then stop for 14 days.   Marland Kitchen levothyroxine (SYNTHROID, LEVOTHROID) 25 MCG tablet  Take 25 mcg by mouth daily before breakfast.   . losartan (COZAAR) 100 MG tablet Take 100 mg by mouth daily.   . Multiple Vitamins-Minerals (MULTIVITAMIN WOMEN 50+ PO) Take by mouth.   . potassium chloride (K-DUR,KLOR-CON) 10 MEQ tablet Take 30 mEq by mouth daily.    Facility-Administered Encounter Medications as of 03/15/2019  Medication  . prochlorperazine (COMPAZINE) tablet 10 mg    ALLERGIES:  Allergies  Allergen Reactions  . Lotensin [Benazepril Hcl]     Facial swelling (Angioedema) Note: ARBs should also be contraindicated, but she is on Losartan!     PHYSICAL EXAM:  ECOG Performance status: 1  Vitals:   03/15/19 1059  BP: (!) 108/40  Pulse: (!) 55  Temp: 97.9 F (36.6 C)  SpO2: 99%   Filed Weights   03/15/19 1059  Weight: 132 lb 1.6 oz (59.9 kg)    Physical Exam Constitutional:      Appearance: Normal appearance.  Cardiovascular:     Rate and Rhythm: Normal rate and regular rhythm.     Heart sounds: Normal heart sounds.  Pulmonary:     Effort: Pulmonary effort is normal.     Breath sounds: Normal breath sounds.  Abdominal:     General: There is no distension.     Palpations: Abdomen is soft.     Tenderness: There is no abdominal tenderness.  Musculoskeletal:        General: No swelling.  Skin:    General: Skin is warm.  Neurological:     General: No focal deficit present.     Mental Status: She is alert and oriented to person, place, and time.  Psychiatric:        Mood and Affect: Mood normal.        Behavior: Behavior normal.      LABORATORY DATA:  I have reviewed the labs as listed.  CBC    Component Value Date/Time   WBC 4.4 03/15/2019 1006   RBC 3.25 (L) 03/15/2019 1006   HGB 9.8 (L) 03/15/2019 1006   HCT 31.0 (L) 03/15/2019 1006   PLT 112 (L) 03/15/2019 1006   MCV 95.4 03/15/2019 1006   MCH 30.2 03/15/2019 1006   MCHC 31.6 03/15/2019 1006   RDW 19.4 (H) 03/15/2019 1006   LYMPHSABS 0.9 03/15/2019 1006   MONOABS 0.4 03/15/2019 1006    EOSABS 0.3 03/15/2019 1006   BASOSABS 0.1 03/15/2019 1006   CMP Latest Ref Rng & Units 03/15/2019 03/01/2019 02/22/2019  Glucose 70 - 99 mg/dL 132(H) 168(H) 126(H)  BUN 8 - 23 mg/dL '17 19 16  ' Creatinine 0.44 - 1.00 mg/dL 1.30(H) 1.13(H) 1.25(H)  Sodium 135 - 145 mmol/L 140 138 141  Potassium 3.5 - 5.1 mmol/L 3.6 3.6 3.8  Chloride 98 - 111 mmol/L 107 106 108  CO2 22 - 32 mmol/L '24 24 26  ' Calcium 8.9 - 10.3 mg/dL 8.8(L) 8.7(L) 9.0  Total Protein 6.5 - 8.1 g/dL 7.5 7.4 7.4  Total Bilirubin 0.3 - 1.2 mg/dL 0.5 0.2(L) 0.4  Alkaline Phos 38 - 126 U/L 53 44 46  AST 15 - 41 U/L '31 31 28  ' ALT 0 - 44 U/L '19 18 18       ' DIAGNOSTIC IMAGING:  I have independently reviewed the scans and discussed with the patient.   I have reviewed Venita Lick LPN's note and agree with the documentation.  I personally performed a face-to-face visit, made revisions and my assessment and plan is as follows.    ASSESSMENT & PLAN:   Multiple myeloma (HCC) 1.  IgG lambda multiple myeloma: - Revlimid 70m and dexamethasone 3 weeks on 1 week off started in September 2009, changed to 2 weeks on 2 weeks off in November 2010, changed to 2 weeks on 3 weeks of in May 2012, Revlimid 15 mg 14/35 days and weekly Dex in January 2015, Revlimid 15 mg change to 14/42 days and weekly Dex in June 2017, dexamethasone stopped in November 2017 - Velcade and dexamethasone weekly, 3 weeks on/1 week off started recently -Dr. LMaylon Peppershas called me to treat this patient locally so that she can follow-up with him periodically.  -She  received Zometa monthly starting September 2009, switch to every 3 months in 2012 and lately off of it. - Velcade 1 mg/m, 3 weeks on 1 week off was started on 06/01/2018.  She takes dexamethasone 10 mg on days of Velcade.  -Revlimid 2 weeks on, 2 weeks off started on 08/14/2018 as M spike and light chains were not not improving.   - Velcade dose was increased to 1.3 mg per metered square on 12/16/2018. -We  discussed the results of myeloma panel from 01/10/2019 with M spike of 1.9 g/dL.  Free light chain ratio is also stable. -She is tolerating Velcade without any side effects.  Denies any neuropathy. - We will continue the same regimen at this time.  We will see her back.  2.  CNS MALT like lymphoma: -Treated with chemotherapy and radiation, last brain MRI was in 2013, CT of the brain in 2018 with no evidence of recurrence.  She does have dementia.  She will continue Aricept 10 mg daily.   Total time spent is 25 minutes with more than 50% of the time spent face-to-face discussing and reiterating the treatment plan and coordination of care.    Orders placed this encounter:  No orders of the defined types were placed in this encounter.     Derek Jack, MD [facility] [phone]

## 2019-03-15 NOTE — Patient Instructions (Addendum)
[place] at [facility] Discharge Instructions  You were seen today by Dr. Delton Coombes. He went over your recent lab results and everything is stable. He will see you back in 4 weeks for labs and follow up.   Thank you for choosing [place] at [facility] to provide your oncology and hematology care.  To afford each patient quality time with our provider, please arrive at least 15 minutes before your scheduled appointment time.   If you have a lab appointment with the [place] please come in thru the  Main Entrance and check in at the main information desk  You need to re-schedule your appointment should you arrive 10 or more minutes late.  We strive to give you quality time with our providers, and arriving late affects you and other patients whose appointments are after yours.  Also, if you no show three or more times for appointments you may be dismissed from the clinic at the providers discretion.     Again, thank you for choosing [facility].  Our hope is that these requests will decrease the amount of time that you wait before being seen by our physicians.       _____________________________________________________________  Should you have questions after your visit to [facility], please contact our office at [phone] between the hours of 8:00 a.m. and 4:30 p.m.  Voicemails left after 4:00 p.m. will not be returned until the following business day.  For prescription refill requests, have your pharmacy contact our office and allow 72 hours.    Cancer Center Support Programs:   > Cancer Support Group  2nd Tuesday of the month 1pm-2pm, Journey Room

## 2019-03-15 NOTE — Progress Notes (Signed)
1145 labs reviewed and pt seen by Dr. Delton Coombes who approved pt for Velcade injection today.   Amanda Wu tolerated Velcade injection without incident or complaint. VSS. Discharged self ambulatory in satisfactory condition. Sister, Deneise Lever, called to come pick pt up.

## 2019-03-16 LAB — KAPPA/LAMBDA LIGHT CHAINS
KAPPA, LAMDA LIGHT CHAIN RATIO: 0.4 (ref 0.26–1.65)
Kappa free light chain: 50.3 mg/L — ABNORMAL HIGH (ref 3.3–19.4)
Lambda free light chains: 126.4 mg/L — ABNORMAL HIGH (ref 5.7–26.3)

## 2019-03-16 LAB — PROTEIN ELECTROPHORESIS, SERUM
A/G RATIO SPE: 0.7 (ref 0.7–1.7)
Albumin ELP: 2.9 g/dL (ref 2.9–4.4)
Alpha-1-Globulin: 0.3 g/dL (ref 0.0–0.4)
Alpha-2-Globulin: 0.9 g/dL (ref 0.4–1.0)
Beta Globulin: 0.8 g/dL (ref 0.7–1.3)
Gamma Globulin: 2.2 g/dL — ABNORMAL HIGH (ref 0.4–1.8)
Globulin, Total: 4.1 g/dL — ABNORMAL HIGH (ref 2.2–3.9)
M-Spike, %: 1.4 g/dL — ABNORMAL HIGH
Total Protein ELP: 7 g/dL (ref 6.0–8.5)

## 2019-03-21 ENCOUNTER — Other Ambulatory Visit: Payer: Self-pay

## 2019-03-22 ENCOUNTER — Ambulatory Visit (HOSPITAL_COMMUNITY): Payer: Medicare PPO

## 2019-03-22 ENCOUNTER — Other Ambulatory Visit (HOSPITAL_COMMUNITY): Payer: Medicare PPO

## 2019-03-23 ENCOUNTER — Inpatient Hospital Stay (HOSPITAL_COMMUNITY): Payer: Medicare PPO

## 2019-03-23 ENCOUNTER — Other Ambulatory Visit: Payer: Self-pay

## 2019-03-23 ENCOUNTER — Encounter (HOSPITAL_COMMUNITY): Payer: Self-pay

## 2019-03-23 ENCOUNTER — Inpatient Hospital Stay (HOSPITAL_COMMUNITY): Payer: Medicare PPO | Attending: Hematology

## 2019-03-23 VITALS — BP 123/64 | HR 71 | Temp 98.4°F | Resp 18 | Wt 132.4 lb

## 2019-03-23 DIAGNOSIS — Z8572 Personal history of non-Hodgkin lymphomas: Secondary | ICD-10-CM | POA: Diagnosis not present

## 2019-03-23 DIAGNOSIS — Z5112 Encounter for antineoplastic immunotherapy: Secondary | ICD-10-CM | POA: Diagnosis not present

## 2019-03-23 DIAGNOSIS — C9000 Multiple myeloma not having achieved remission: Secondary | ICD-10-CM

## 2019-03-23 LAB — COMPREHENSIVE METABOLIC PANEL
ALT: 18 U/L (ref 0–44)
AST: 31 U/L (ref 15–41)
Albumin: 3.1 g/dL — ABNORMAL LOW (ref 3.5–5.0)
Alkaline Phosphatase: 45 U/L (ref 38–126)
Anion gap: 8 (ref 5–15)
BUN: 22 mg/dL (ref 8–23)
CO2: 24 mmol/L (ref 22–32)
Calcium: 9.1 mg/dL (ref 8.9–10.3)
Chloride: 107 mmol/L (ref 98–111)
Creatinine, Ser: 1.24 mg/dL — ABNORMAL HIGH (ref 0.44–1.00)
GFR calc Af Amer: 50 mL/min — ABNORMAL LOW (ref >60)
GFR calc non Af Amer: 43 mL/min — ABNORMAL LOW (ref >60)
Glucose, Bld: 181 mg/dL — ABNORMAL HIGH (ref 70–99)
Potassium: 4 mmol/L (ref 3.5–5.1)
Sodium: 139 mmol/L (ref 135–145)
Total Bilirubin: 0.5 mg/dL (ref 0.3–1.2)
Total Protein: 7.3 g/dL (ref 6.5–8.1)

## 2019-03-23 LAB — CBC WITH DIFFERENTIAL/PLATELET
Abs Immature Granulocytes: 0.02 10*3/uL (ref 0.00–0.07)
Basophils Absolute: 0 10*3/uL (ref 0.0–0.1)
Basophils Relative: 1 %
Eosinophils Absolute: 0.1 10*3/uL (ref 0.0–0.5)
Eosinophils Relative: 4 %
HCT: 32.1 % — ABNORMAL LOW (ref 36.0–46.0)
Hemoglobin: 10.1 g/dL — ABNORMAL LOW (ref 12.0–15.0)
Immature Granulocytes: 1 %
Lymphocytes Relative: 22 %
Lymphs Abs: 0.7 10*3/uL (ref 0.7–4.0)
MCH: 30.2 pg (ref 26.0–34.0)
MCHC: 31.5 g/dL (ref 30.0–36.0)
MCV: 96.1 fL (ref 80.0–100.0)
Monocytes Absolute: 0.5 10*3/uL (ref 0.1–1.0)
Monocytes Relative: 15 %
Neutro Abs: 1.7 10*3/uL (ref 1.7–7.7)
Neutrophils Relative %: 57 %
Platelets: 79 10*3/uL — ABNORMAL LOW (ref 150–400)
RBC: 3.34 MIL/uL — ABNORMAL LOW (ref 3.87–5.11)
RDW: 19.5 % — ABNORMAL HIGH (ref 11.5–15.5)
WBC: 3 10*3/uL — ABNORMAL LOW (ref 4.0–10.5)
nRBC: 0 % (ref 0.0–0.2)

## 2019-03-23 MED ORDER — PROCHLORPERAZINE MALEATE 10 MG PO TABS: 10.0000 mg | ORAL_TABLET | Freq: Once | ORAL | Status: DC

## 2019-03-23 MED ORDER — DEXAMETHASONE 4 MG PO TABS
10.0000 mg | ORAL_TABLET | Freq: Once | ORAL | Status: AC
  Administered 2019-03-23: 10 mg via ORAL
  Filled 2019-03-23: qty 3

## 2019-03-23 MED ORDER — BORTEZOMIB CHEMO SQ INJECTION 3.5 MG (2.5MG/ML)
1.3000 mg/m2 | Freq: Once | INTRAMUSCULAR | Status: AC
  Administered 2019-03-23: 10:00:00 2.25 mg via SUBCUTANEOUS
  Filled 2019-03-23: qty 0.9

## 2019-03-23 NOTE — Patient Instructions (Signed)
[facility] Discharge Instructions for Patients Receiving Chemotherapy  Today you received the following chemotherapy agents   To help prevent nausea and vomiting after your treatment, we encourage you to take your nausea medication   If you develop nausea and vomiting that is not controlled by your nausea medication, call the clinic.   BELOW ARE SYMPTOMS THAT SHOULD BE REPORTED IMMEDIATELY:  *FEVER GREATER THAN 100.5 F  *CHILLS WITH OR WITHOUT FEVER  NAUSEA AND VOMITING THAT IS NOT CONTROLLED WITH YOUR NAUSEA MEDICATION  *UNUSUAL SHORTNESS OF BREATH  *UNUSUAL BRUISING OR BLEEDING  TENDERNESS IN MOUTH AND THROAT WITH OR WITHOUT PRESENCE OF ULCERS  *URINARY PROBLEMS  *BOWEL PROBLEMS  UNUSUAL RASH Items with * indicate a potential emergency and should be followed up as soon as possible.  Feel free to call the clinic should you have any questions or concerns. The clinic phone number is [phone].  Please show the CHEMO ALERT CARD at check-in to the Emergency Department and triage nurse.   

## 2019-03-23 NOTE — Progress Notes (Signed)
Pt presents today for Velcade injection. MAR reviewed. Revlimid taken as prescribed by MD.   Labs reviewed by MD. Platelet count 79 thousand. MD aware. Proceed with treatment VO received.   Velcade given today per MD orders. Tolerated infusion without adverse affects. Vital signs stable. No complaints at this time. Discharged from clinic ambulatory. F/U with [facility] as scheduled.

## 2019-03-29 ENCOUNTER — Ambulatory Visit (HOSPITAL_COMMUNITY): Payer: Medicare PPO

## 2019-03-29 ENCOUNTER — Other Ambulatory Visit (HOSPITAL_COMMUNITY): Payer: Medicare PPO

## 2019-03-30 ENCOUNTER — Other Ambulatory Visit: Payer: Self-pay

## 2019-03-31 ENCOUNTER — Other Ambulatory Visit: Payer: Self-pay

## 2019-03-31 ENCOUNTER — Inpatient Hospital Stay (HOSPITAL_COMMUNITY): Payer: Medicare PPO

## 2019-03-31 ENCOUNTER — Encounter (HOSPITAL_COMMUNITY): Payer: Self-pay

## 2019-03-31 VITALS — BP 109/44 | HR 54 | Temp 97.5°F | Resp 18 | Wt 131.6 lb

## 2019-03-31 DIAGNOSIS — C9000 Multiple myeloma not having achieved remission: Secondary | ICD-10-CM | POA: Diagnosis not present

## 2019-03-31 DIAGNOSIS — Z8572 Personal history of non-Hodgkin lymphomas: Secondary | ICD-10-CM | POA: Diagnosis not present

## 2019-03-31 DIAGNOSIS — Z5112 Encounter for antineoplastic immunotherapy: Secondary | ICD-10-CM | POA: Diagnosis not present

## 2019-03-31 LAB — CBC WITH DIFFERENTIAL/PLATELET
Abs Immature Granulocytes: 0.01 10*3/uL (ref 0.00–0.07)
Basophils Absolute: 0 10*3/uL (ref 0.0–0.1)
Basophils Relative: 1 %
Eosinophils Absolute: 0.2 10*3/uL (ref 0.0–0.5)
Eosinophils Relative: 3 %
HCT: 31.6 % — ABNORMAL LOW (ref 36.0–46.0)
Hemoglobin: 9.9 g/dL — ABNORMAL LOW (ref 12.0–15.0)
Immature Granulocytes: 0 %
Lymphocytes Relative: 15 %
Lymphs Abs: 0.7 10*3/uL (ref 0.7–4.0)
MCH: 29.9 pg (ref 26.0–34.0)
MCHC: 31.3 g/dL (ref 30.0–36.0)
MCV: 95.5 fL (ref 80.0–100.0)
Monocytes Absolute: 0.2 10*3/uL (ref 0.1–1.0)
Monocytes Relative: 5 %
Neutro Abs: 3.4 10*3/uL (ref 1.7–7.7)
Neutrophils Relative %: 76 %
Platelets: 100 10*3/uL — ABNORMAL LOW (ref 150–400)
RBC: 3.31 MIL/uL — ABNORMAL LOW (ref 3.87–5.11)
RDW: 19.9 % — ABNORMAL HIGH (ref 11.5–15.5)
WBC: 4.4 10*3/uL (ref 4.0–10.5)
nRBC: 0 % (ref 0.0–0.2)

## 2019-03-31 LAB — COMPREHENSIVE METABOLIC PANEL
ALT: 18 U/L (ref 0–44)
AST: 28 U/L (ref 15–41)
Albumin: 3.1 g/dL — ABNORMAL LOW (ref 3.5–5.0)
Alkaline Phosphatase: 44 U/L (ref 38–126)
Anion gap: 8 (ref 5–15)
BUN: 24 mg/dL — ABNORMAL HIGH (ref 8–23)
CO2: 25 mmol/L (ref 22–32)
Calcium: 8.9 mg/dL (ref 8.9–10.3)
Chloride: 105 mmol/L (ref 98–111)
Creatinine, Ser: 1.22 mg/dL — ABNORMAL HIGH (ref 0.44–1.00)
GFR calc Af Amer: 51 mL/min — ABNORMAL LOW (ref >60)
GFR calc non Af Amer: 44 mL/min — ABNORMAL LOW (ref >60)
Glucose, Bld: 131 mg/dL — ABNORMAL HIGH (ref 70–99)
Potassium: 3.6 mmol/L (ref 3.5–5.1)
Sodium: 138 mmol/L (ref 135–145)
Total Bilirubin: 0.5 mg/dL (ref 0.3–1.2)
Total Protein: 7.4 g/dL (ref 6.5–8.1)

## 2019-03-31 MED ORDER — BORTEZOMIB CHEMO SQ INJECTION 3.5 MG (2.5MG/ML)
1.3000 mg/m2 | Freq: Once | INTRAMUSCULAR | Status: AC
  Administered 2019-03-31: 2.25 mg via SUBCUTANEOUS
  Filled 2019-03-31: qty 0.9

## 2019-03-31 MED ORDER — DEXAMETHASONE 4 MG PO TABS
10.0000 mg | ORAL_TABLET | Freq: Once | ORAL | Status: AC
  Administered 2019-03-31: 10 mg via ORAL
  Filled 2019-03-31: qty 3

## 2019-03-31 MED ORDER — PROCHLORPERAZINE MALEATE 10 MG PO TABS: 10.0000 mg | ORAL_TABLET | Freq: Once | ORAL | Status: DC

## 2019-03-31 NOTE — Progress Notes (Signed)
Amanda Wu tolerated Velcade injection well without complaints or incident. Labs reviewed prior to administering this medication. VSS Pt discharged self ambulatory in satisfactory condition

## 2019-03-31 NOTE — Patient Instructions (Signed)
[facility] Discharge Instructions for Patients Receiving Chemotherapy   Beginning January 23rd 2017 lab work for the Cancer Center will be done in the  Main lab at [facility] on 1st floor. If you have a lab appointment with the Cancer Center please come in thru the  Main Entrance and check in at the main information desk   Today you received the following chemotherapy agents Velcade injection. Follow-up as scheduled. Call clinic for any questions or concerns  To help prevent nausea and vomiting after your treatment, we encourage you to take your nausea medication   If you develop nausea and vomiting, or diarrhea that is not controlled by your medication, call the clinic.  The clinic phone number is [phone]. Office hours are Monday-Friday 8:30am-5:00pm.  BELOW ARE SYMPTOMS THAT SHOULD BE REPORTED IMMEDIATELY:  *FEVER GREATER THAN 101.0 F  *CHILLS WITH OR WITHOUT FEVER  NAUSEA AND VOMITING THAT IS NOT CONTROLLED WITH YOUR NAUSEA MEDICATION  *UNUSUAL SHORTNESS OF BREATH  *UNUSUAL BRUISING OR BLEEDING  TENDERNESS IN MOUTH AND THROAT WITH OR WITHOUT PRESENCE OF ULCERS  *URINARY PROBLEMS  *BOWEL PROBLEMS  UNUSUAL RASH Items with * indicate a potential emergency and should be followed up as soon as possible. If you have an emergency after office hours please contact your primary care physician or go to the nearest emergency department.  Please call the clinic during office hours if you have any questions or concerns.   You may also contact the Patient Navigator at [phone] should you have any questions or need assistance in obtaining follow up care.      Resources For Cancer Patients and their Caregivers ? American Cancer Society: Can assist with transportation, wigs, general needs, runs Look Good Feel Better.        1-888-227-6333 ? Cancer Care: Provides financial assistance, online support groups, medication/co-pay assistance.   1-800-813-HOPE (4673) ? [facility] Assists [place] cancer patients and their families through emotional , educational and financial support.  336-427-4357 ? [place] DSS Where to apply for food stamps, Medicaid and utility assistance. 336-342-1394 ? [facility]: Transportation to medical appointments. 336-347-2287 ? Social Security Administration: May apply for disability if have a Stage IV cancer. 336-342-7796 1-800-772-1213 ? [place] Aging, Disability and Transit Services: Assists with nutrition, care and transit needs. 336-349-2343         

## 2019-04-01 MED ORDER — DIPHENHYDRAMINE HCL 50 MG/ML IJ SOLN
INTRAMUSCULAR | Status: AC
  Filled 2019-04-01: qty 1

## 2019-04-12 ENCOUNTER — Other Ambulatory Visit: Payer: Self-pay

## 2019-04-12 ENCOUNTER — Other Ambulatory Visit (HOSPITAL_COMMUNITY): Payer: Self-pay | Admitting: *Deleted

## 2019-04-12 ENCOUNTER — Encounter (HOSPITAL_COMMUNITY): Payer: Self-pay | Admitting: Hematology

## 2019-04-12 ENCOUNTER — Inpatient Hospital Stay (HOSPITAL_COMMUNITY): Payer: Medicare PPO

## 2019-04-12 ENCOUNTER — Inpatient Hospital Stay (HOSPITAL_COMMUNITY): Payer: Medicare PPO | Admitting: Hematology

## 2019-04-12 VITALS — BP 107/55 | HR 76 | Temp 97.6°F | Resp 18 | Wt 131.0 lb

## 2019-04-12 DIAGNOSIS — Z5112 Encounter for antineoplastic immunotherapy: Secondary | ICD-10-CM | POA: Diagnosis not present

## 2019-04-12 DIAGNOSIS — C9000 Multiple myeloma not having achieved remission: Secondary | ICD-10-CM

## 2019-04-12 DIAGNOSIS — Z8572 Personal history of non-Hodgkin lymphomas: Secondary | ICD-10-CM | POA: Diagnosis not present

## 2019-04-12 LAB — CBC WITH DIFFERENTIAL/PLATELET
Abs Immature Granulocytes: 0.02 10*3/uL (ref 0.00–0.07)
Basophils Absolute: 0 10*3/uL (ref 0.0–0.1)
Basophils Relative: 1 %
Eosinophils Absolute: 0.2 10*3/uL (ref 0.0–0.5)
Eosinophils Relative: 5 %
HCT: 31 % — ABNORMAL LOW (ref 36.0–46.0)
Hemoglobin: 9.9 g/dL — ABNORMAL LOW (ref 12.0–15.0)
Immature Granulocytes: 1 %
Lymphocytes Relative: 15 %
Lymphs Abs: 0.7 10*3/uL (ref 0.7–4.0)
MCH: 30.7 pg (ref 26.0–34.0)
MCHC: 31.9 g/dL (ref 30.0–36.0)
MCV: 96 fL (ref 80.0–100.0)
Monocytes Absolute: 0.4 10*3/uL (ref 0.1–1.0)
Monocytes Relative: 9 %
Neutro Abs: 3 10*3/uL (ref 1.7–7.7)
Neutrophils Relative %: 69 %
Platelets: 122 10*3/uL — ABNORMAL LOW (ref 150–400)
RBC: 3.23 MIL/uL — ABNORMAL LOW (ref 3.87–5.11)
RDW: 19.2 % — ABNORMAL HIGH (ref 11.5–15.5)
WBC: 4.4 10*3/uL (ref 4.0–10.5)
nRBC: 0 % (ref 0.0–0.2)

## 2019-04-12 LAB — COMPREHENSIVE METABOLIC PANEL
ALT: 22 U/L (ref 0–44)
AST: 36 U/L (ref 15–41)
Albumin: 2.9 g/dL — ABNORMAL LOW (ref 3.5–5.0)
Alkaline Phosphatase: 48 U/L (ref 38–126)
Anion gap: 9 (ref 5–15)
BUN: 20 mg/dL (ref 8–23)
CO2: 23 mmol/L (ref 22–32)
Calcium: 8.8 mg/dL — ABNORMAL LOW (ref 8.9–10.3)
Chloride: 108 mmol/L (ref 98–111)
Creatinine, Ser: 1.26 mg/dL — ABNORMAL HIGH (ref 0.44–1.00)
GFR calc Af Amer: 49 mL/min — ABNORMAL LOW (ref >60)
GFR calc non Af Amer: 42 mL/min — ABNORMAL LOW (ref >60)
Glucose, Bld: 152 mg/dL — ABNORMAL HIGH (ref 70–99)
Potassium: 4 mmol/L (ref 3.5–5.1)
Sodium: 140 mmol/L (ref 135–145)
Total Bilirubin: 0.4 mg/dL (ref 0.3–1.2)
Total Protein: 7.5 g/dL (ref 6.5–8.1)

## 2019-04-12 MED ORDER — LENALIDOMIDE 10 MG PO CAPS: ORAL_CAPSULE | ORAL | 0 refills | Status: DC

## 2019-04-12 MED ORDER — BORTEZOMIB CHEMO SQ INJECTION 3.5 MG (2.5MG/ML)
1.3000 mg/m2 | Freq: Once | INTRAMUSCULAR | Status: AC
  Administered 2019-04-12: 2.25 mg via SUBCUTANEOUS
  Filled 2019-04-12: qty 0.9

## 2019-04-12 MED ORDER — DEXAMETHASONE 4 MG PO TABS
10.0000 mg | ORAL_TABLET | Freq: Once | ORAL | Status: AC
  Administered 2019-04-12: 10:00:00 10 mg via ORAL
  Filled 2019-04-12: qty 3

## 2019-04-12 MED ORDER — PROCHLORPERAZINE MALEATE 10 MG PO TABS: 10.0000 mg | ORAL_TABLET | Freq: Once | ORAL | Status: DC

## 2019-04-12 NOTE — Progress Notes (Signed)
[facility] [place], [place] [address]   CLINIC:  Medical Oncology/Hematology  PCP:  Monico Blitz, MD [place] [address] [phone]   REASON FOR VISIT:  Follow-up for multiple myeloma  CURRENT THERAPY:Velcade, Revlimid, and dexamethasone   BRIEF ONCOLOGIC HISTORY:    Multiple myeloma ([place])   07/09/2017 Initial Diagnosis    Multiple myeloma ([place])    06/01/2018 -  Chemotherapy    The patient had bortezomib SQ (VELCADE) chemo injection 1.75 mg, 1 mg/m2 = 1.75 mg (100 % of original dose 1 mg/m2), Subcutaneous,  Once, 12 of 16 cycles Dose modification: 1 mg/m2 (original dose 1 mg/m2, Cycle 1, Reason: Provider Judgment), 1.3 mg/m2 (original dose 1 mg/m2, Cycle 8, Reason: Provider Judgment) Administration: 1.75 mg (06/01/2018), 1.75 mg (06/08/2018), 1.75 mg (06/15/2018), 1.75 mg (06/29/2018), 1.75 mg (07/06/2018), 1.75 mg (07/13/2018), 1.75 mg (07/27/2018), 1.75 mg (08/03/2018), 1.75 mg (08/10/2018), 1.75 mg (08/24/2018), 1.75 mg (08/31/2018), 1.75 mg (09/07/2018), 1.75 mg (09/21/2018), 1.75 mg (09/28/2018), 1.75 mg (10/05/2018), 1.75 mg (10/19/2018), 1.75 mg (10/26/2018), 1.75 mg (11/02/2018), 1.75 mg (11/16/2018), 1.75 mg (11/23/2018), 1.75 mg (11/30/2018), 2.25 mg (12/17/2018), 2.25 mg (12/24/2018), 2.25 mg (01/04/2019), 2.25 mg (01/18/2019), 2.25 mg (01/25/2019), 2.25 mg (02/01/2019), 2.25 mg (02/15/2019), 2.25 mg (02/22/2019), 2.25 mg (03/01/2019), 2.25 mg (03/15/2019), 2.25 mg (03/23/2019), 2.25 mg (03/31/2019)  for chemotherapy treatment.       CANCER STAGING: Cancer Staging No matching staging information was found for the patient.   INTERVAL HISTORY:  Ms. Cavalieri 73 y.o. female returns for routine follow-up. She is here today alone. She states that she has been doing well since her last visit. She states that is taking her medication as directed with no missed doses or side effects. Denies any nausea, vomiting, or diarrhea. Denies any new pains. Had not noticed any  recent bleeding such as epistaxis, hematuria or hematochezia. Denies recent chest pain on exertion, shortness of breath on minimal exertion, pre-syncopal episodes, or palpitations. Denies any numbness or tingling in hands or feet. Denies any recent fevers, infections, or recent hospitalizations. Patient reports appetite at 100% and energy level at 100%.   REVIEW OF SYSTEMS:  Review of Systems  Gastrointestinal: Positive for diarrhea.  All other systems reviewed and are negative.    PAST MEDICAL/SURGICAL HISTORY:  Past Medical History:  Diagnosis Date  . Benign hypertension   . Cataract   . 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 Diabetes mellitus without complication ([place])   . H/O partial nephrectomy   . Hypokalemia   . Hypothyroidism   . Impaired cognition   . Multiple myeloma (HCC)    Dr Maylon Peppers, [facility]  . Thyroid disease    Past Surgical History:  Procedure Laterality Date  . ABDOMINAL HYSTERECTOMY    . CRANIOTOMY     for lymphoma  . YAG LASER APPLICATION Right 0/30/0923   Procedure: YAG LASER APPLICATION;  Surgeon: [name], MD;  Location: [facility];  Service: Ophthalmology;  Laterality: Right;     SOCIAL HISTORY:  Social History   Socioeconomic History  . Marital status: Single    Spouse name: Not on file  . Number of children: Not on file  . Years of education: Not on file  . Highest education level: Not on file  Occupational History  . Not on file  Social Needs  . Financial resource strain: Not on file  . Food insecurity:    Worry: Not on file    Inability:  Not on file  . Transportation needs:    Medical: Not on file    Non-medical: Not on file  Tobacco Use  . Smoking status: Never Smoker  . Smokeless tobacco: Never Used  Substance and Sexual Activity  . Alcohol use: No  . Drug use: No  . Sexual activity: Not on file  Lifestyle  . Physical activity:    Days per week: Not on file    Minutes per session: Not on file  . Stress: Not on file   Relationships  . Social connections:    Talks on phone: Not on file    Gets together: Not on file    Attends religious service: Not on file    Active member of club or organization: Not on file    Attends meetings of clubs or organizations: Not on file    Relationship status: Not on file  . Intimate partner violence:    Fear of current or ex partner: Not on file    Emotionally abused: Not on file    Physically abused: Not on file    Forced sexual activity: Not on file  Other Topics Concern  . Not on file  Social History Narrative  . Not on file    FAMILY HISTORY:  Family History  Problem Relation Age of Onset  . Depression Mother   . Diabetes Mother   . Heart disease Father   . Cancer - Prostate Brother   . Cancer Brother   . Cancer Sister     CURRENT MEDICATIONS:  Outpatient Encounter Medications as of 04/12/2019  Medication Sig Note  . ACCU-CHEK SMARTVIEW test strip    . acetaminophen (TYLENOL) 500 MG tablet Take 1,000 mg by mouth every 6 (six) hours as needed for mild pain.    Marland Kitchen acyclovir (ZOVIRAX) 400 MG tablet TAKE ONE TABLET BY MOUTH TWICE DAILY   . amLODipine (NORVASC) 5 MG tablet Take 5 mg by mouth daily.    Marland Kitchen aspirin EC 81 MG tablet Take 81 mg by mouth daily.   . bortezomib IV (VELCADE) 3.5 MG injection Inject into the vein.   . calcium-vitamin D (OSCAL WITH D) 500-200 MG-UNIT tablet Take 1 tablet by mouth daily with breakfast.   . dexamethasone (DECADRON) 4 MG tablet TAKE TWO AND ONE-HALF TABLETS BY MOUTH WEEKLY FOR THREE WEEKS (THREE WEEKS ON, ONE WEEK OFF) IN A ROW AND REPEAT MONTHLY 06/22/2018: Patient states that she is given the dexamethasone while here getting her velcade injection  . donepezil (ARICEPT) 10 MG tablet TAKE 1 TABLET BY MOUTH every evening   . ferrous sulfate 325 (65 FE) MG tablet Take 325 mg by mouth daily with breakfast.   . levothyroxine (SYNTHROID, LEVOTHROID) 25 MCG tablet Take 25 mcg by mouth daily before breakfast.   . losartan (COZAAR)  100 MG tablet Take 100 mg by mouth daily.   . Multiple Vitamins-Minerals (MULTIVITAMIN WOMEN 50+ PO) Take by mouth.   . potassium chloride (K-DUR,KLOR-CON) 10 MEQ tablet Take 30 mEq by mouth daily.   . [DISCONTINUED] lenalidomide (REVLIMID) 10 MG capsule Take 1 tablet (10 mg) daily by mouth for 14 days then stop for 14 days.    Facility-Administered Encounter Medications as of 04/12/2019  Medication  . prochlorperazine (COMPAZINE) tablet 10 mg    ALLERGIES:  Allergies  Allergen Reactions  . Lotensin [Benazepril Hcl]     Facial swelling (Angioedema) Note: ARBs should also be contraindicated, but she is on Losartan!     PHYSICAL EXAM:  ECOG Performance status: 1  Vitals:   04/12/19 0938  BP: (!) 107/55  Pulse: 76  Resp: 18  Temp: 97.6 F (36.4 C)  SpO2: 99%   Filed Weights   04/12/19 0938  Weight: 131 lb (59.4 kg)    Physical Exam Vitals signs reviewed.  Constitutional:      Appearance: Normal appearance.  Cardiovascular:     Rate and Rhythm: Normal rate and regular rhythm.     Heart sounds: Normal heart sounds.  Pulmonary:     Effort: Pulmonary effort is normal.     Breath sounds: Normal breath sounds.  Abdominal:     General: There is no distension.     Palpations: Abdomen is soft. There is no mass.  Musculoskeletal:        General: No swelling.  Skin:    General: Skin is warm.  Neurological:     General: No focal deficit present.     Mental Status: She is alert and oriented to person, place, and time.  Psychiatric:        Mood and Affect: Mood normal.        Behavior: Behavior normal.      LABORATORY DATA:  I have reviewed the labs as listed.  CBC    Component Value Date/Time   WBC 4.4 04/12/2019 0910   RBC 3.23 (L) 04/12/2019 0910   HGB 9.9 (L) 04/12/2019 0910   HCT 31.0 (L) 04/12/2019 0910   PLT 122 (L) 04/12/2019 0910   MCV 96.0 04/12/2019 0910   MCH 30.7 04/12/2019 0910   MCHC 31.9 04/12/2019 0910   RDW 19.2 (H) 04/12/2019 0910    LYMPHSABS 0.7 04/12/2019 0910   MONOABS 0.4 04/12/2019 0910   EOSABS 0.2 04/12/2019 0910   BASOSABS 0.0 04/12/2019 0910   CMP Latest Ref Rng & Units 04/12/2019 03/31/2019 03/23/2019  Glucose 70 - 99 mg/dL 152(H) 131(H) 181(H)  BUN 8 - 23 mg/dL 20 24(H) 22  Creatinine 0.44 - 1.00 mg/dL 1.26(H) 1.22(H) 1.24(H)  Sodium 135 - 145 mmol/L 140 138 139  Potassium 3.5 - 5.1 mmol/L 4.0 3.6 4.0  Chloride 98 - 111 mmol/L 108 105 107  CO2 22 - 32 mmol/L '23 25 24  ' Calcium 8.9 - 10.3 mg/dL 8.8(L) 8.9 9.1  Total Protein 6.5 - 8.1 g/dL 7.5 7.4 7.3  Total Bilirubin 0.3 - 1.2 mg/dL 0.4 0.5 0.5  Alkaline Phos 38 - 126 U/L 48 44 45  AST 15 - 41 U/L 36 28 31  ALT 0 - 44 U/L '22 18 18       ' DIAGNOSTIC IMAGING:  I have independently reviewed the scans and discussed with the patient.   I have reviewed Venita Lick LPN's note and agree with the documentation.  I personally performed a face-to-face visit, made revisions and my assessment and plan is as follows.    ASSESSMENT & PLAN:   Multiple myeloma (HCC) 1.  IgG lambda multiple myeloma: - Revlimid 80m and dexamethasone 3 weeks on 1 week off started in September 2009, changed to 2 weeks on 2 weeks off in November 2010, changed to 2 weeks on 3 weeks of in May 2012, Revlimid 15 mg 14/35 days and weekly Dex in January 2015, Revlimid 15 mg change to 14/42 days and weekly Dex in June 2017, dexamethasone stopped in November 2017 -She received Zometa monthly starting September 2009, switched to every 3 months in 2012 and lately off of it. -Velcade 1 mg per metered square, 3 weeks on 1  week off started on 06/01/2018.  She takes dexamethasone 10 mg on days of Velcade. -Revlimid 10 mg 2 weeks on, 2 weeks of added on 08/14/2018 for better response. - Velcade dose increased to 1.3 mg per metered square on 12/16/2018. - We discussed myeloma panel from 03/15/2019.  M spike improved to 1.4, previously 1.9 g.  Free light chain ratio was 0.4, previously 0.28.  Lambda light  chains are 126, previously 112. -She is tolerating Velcade and Revlimid very well.  I have reviewed her CBC also.  This is stable. - She will proceed with her treatment today.  I plan to repeat her myeloma panel and see her back in 4 weeks.  2.  CNS MALT like lymphoma: -Treated with chemotherapy and radiation, last brain MRI was in 2013, CT of the brain in 2018 with no evidence of recurrence.  She does have dementia.  She will continue Aricept 10 mg daily.   Total time spent is 25 minutes with more than 50% of the time spent explaining treatment plan, side effects and coordination of care.    Orders placed this encounter:  Orders Placed This Encounter  Procedures  . CBC with Differential/Platelet  . Comprehensive metabolic panel  . Protein electrophoresis, serum  . Kappa/lambda light chains  . Lactate dehydrogenase      Derek Jack, MD [place] [phone]

## 2019-04-12 NOTE — Patient Instructions (Signed)
[place] at [name] Discharge Instructions  You were seen today by Dr. Delton Coombes. He went over your recent lab results were good. He will see you back in 4 weeks for follow up.   Thank you for choosing [place] at [facility] to provide your oncology and hematology care.  To afford each patient quality time with our provider, please arrive at least 15 minutes before your scheduled appointment time.   If you have a lab appointment with the [facility] please come in thru the  Main Entrance and check in at the main information desk  You need to re-schedule your appointment should you arrive 10 or more minutes late.  We strive to give you quality time with our providers, and arriving late affects you and other patients whose appointments are after yours.  Also, if you no show three or more times for appointments you may be dismissed from the clinic at the providers discretion.     Again, thank you for choosing [facility].  Our hope is that these requests will decrease the amount of time that you wait before being seen by our physicians.       _____________________________________________________________  Should you have questions after your visit to [facility], please contact our office at [phone] between the hours of 8:00 a.m. and 4:30 p.m.  Voicemails left after 4:00 p.m. will not be returned until the following business day.  For prescription refill requests, have your pharmacy contact our office and allow 72 hours.    Cancer Center Support Programs:   > Cancer Support Group  2nd Tuesday of the month 1pm-2pm, Journey Room

## 2019-04-12 NOTE — Assessment & Plan Note (Signed)
1.  IgG lambda multiple myeloma: - Revlimid 14m and dexamethasone 3 weeks on 1 week off started in September 2009, changed to 2 weeks on 2 weeks off in November 2010, changed to 2 weeks on 3 weeks of in May 2012, Revlimid 15 mg 14/35 days and weekly Dex in January 2015, Revlimid 15 mg change to 14/42 days and weekly Dex in June 2017, dexamethasone stopped in November 2017 -She received Zometa monthly starting September 2009, switched to every 3 months in 2012 and lately off of it. -Velcade 1 mg per metered square, 3 weeks on 1 week off started on 06/01/2018.  She takes dexamethasone 10 mg on days of Velcade. -Revlimid 10 mg 2 weeks on, 2 weeks of added on 08/14/2018 for better response. - Velcade dose increased to 1.3 mg per metered square on 12/16/2018. - We discussed myeloma panel from 03/15/2019.  M spike improved to 1.4, previously 1.9 g.  Free light chain ratio was 0.4, previously 0.28.  Lambda light chains are 126, previously 112. -She is tolerating Velcade and Revlimid very well.  I have reviewed her CBC also.  This is stable. - She will proceed with her treatment today.  I plan to repeat her myeloma panel and see her back in 4 weeks.  2.  CNS MALT like lymphoma: -Treated with chemotherapy and radiation, last brain MRI was in 2013, CT of the brain in 2018 with no evidence of recurrence.  She does have dementia.  She will continue Aricept 10 mg daily.

## 2019-04-13 MED ORDER — DENOSUMAB 120 MG/1.7ML ~~LOC~~ SOLN
SUBCUTANEOUS | Status: AC
  Filled 2019-04-13: qty 1.7

## 2019-04-15 MED ORDER — OCTREOTIDE ACETATE 30 MG IM KIT
PACK | INTRAMUSCULAR | Status: AC
  Filled 2019-04-15: qty 1

## 2019-04-19 ENCOUNTER — Inpatient Hospital Stay (HOSPITAL_COMMUNITY): Payer: Medicare PPO

## 2019-04-19 ENCOUNTER — Other Ambulatory Visit: Payer: Self-pay

## 2019-04-19 ENCOUNTER — Encounter (HOSPITAL_COMMUNITY): Payer: Self-pay

## 2019-04-19 ENCOUNTER — Inpatient Hospital Stay (HOSPITAL_COMMUNITY): Payer: Medicare PPO | Attending: Hematology

## 2019-04-19 VITALS — BP 117/55 | HR 68 | Temp 98.5°F | Resp 18 | Wt 131.0 lb

## 2019-04-19 DIAGNOSIS — Z833 Family history of diabetes mellitus: Secondary | ICD-10-CM | POA: Diagnosis not present

## 2019-04-19 DIAGNOSIS — Z923 Personal history of irradiation: Secondary | ICD-10-CM | POA: Diagnosis not present

## 2019-04-19 DIAGNOSIS — Z809 Family history of malignant neoplasm, unspecified: Secondary | ICD-10-CM | POA: Diagnosis not present

## 2019-04-19 DIAGNOSIS — Z8249 Family history of ischemic heart disease and other diseases of the circulatory system: Secondary | ICD-10-CM | POA: Diagnosis not present

## 2019-04-19 DIAGNOSIS — Z9221 Personal history of antineoplastic chemotherapy: Secondary | ICD-10-CM | POA: Diagnosis not present

## 2019-04-19 DIAGNOSIS — Z79899 Other long term (current) drug therapy: Secondary | ICD-10-CM | POA: Diagnosis not present

## 2019-04-19 DIAGNOSIS — Z905 Acquired absence of kidney: Secondary | ICD-10-CM | POA: Diagnosis not present

## 2019-04-19 DIAGNOSIS — C9000 Multiple myeloma not having achieved remission: Secondary | ICD-10-CM

## 2019-04-19 DIAGNOSIS — R197 Diarrhea, unspecified: Secondary | ICD-10-CM | POA: Insufficient documentation

## 2019-04-19 DIAGNOSIS — F0390 Unspecified dementia without behavioral disturbance: Secondary | ICD-10-CM | POA: Insufficient documentation

## 2019-04-19 DIAGNOSIS — Z8572 Personal history of non-Hodgkin lymphomas: Secondary | ICD-10-CM | POA: Insufficient documentation

## 2019-04-19 DIAGNOSIS — Z888 Allergy status to other drugs, medicaments and biological substances status: Secondary | ICD-10-CM | POA: Insufficient documentation

## 2019-04-19 DIAGNOSIS — Z8042 Family history of malignant neoplasm of prostate: Secondary | ICD-10-CM | POA: Diagnosis not present

## 2019-04-19 DIAGNOSIS — Z5112 Encounter for antineoplastic immunotherapy: Secondary | ICD-10-CM | POA: Insufficient documentation

## 2019-04-19 LAB — CBC WITH DIFFERENTIAL/PLATELET
Abs Immature Granulocytes: 0 10*3/uL (ref 0.00–0.07)
Basophils Absolute: 0 10*3/uL (ref 0.0–0.1)
Basophils Relative: 0 %
Eosinophils Absolute: 0.1 10*3/uL (ref 0.0–0.5)
Eosinophils Relative: 3 %
HCT: 30.3 % — ABNORMAL LOW (ref 36.0–46.0)
Hemoglobin: 9.7 g/dL — ABNORMAL LOW (ref 12.0–15.0)
Immature Granulocytes: 0 %
Lymphocytes Relative: 22 %
Lymphs Abs: 0.7 10*3/uL (ref 0.7–4.0)
MCH: 30.4 pg (ref 26.0–34.0)
MCHC: 32 g/dL (ref 30.0–36.0)
MCV: 95 fL (ref 80.0–100.0)
Monocytes Absolute: 0.5 10*3/uL (ref 0.1–1.0)
Monocytes Relative: 17 %
Neutro Abs: 1.8 10*3/uL (ref 1.7–7.7)
Neutrophils Relative %: 58 %
Platelets: 84 10*3/uL — ABNORMAL LOW (ref 150–400)
RBC: 3.19 MIL/uL — ABNORMAL LOW (ref 3.87–5.11)
RDW: 19.5 % — ABNORMAL HIGH (ref 11.5–15.5)
WBC: 3.2 10*3/uL — ABNORMAL LOW (ref 4.0–10.5)
nRBC: 0 % (ref 0.0–0.2)

## 2019-04-19 LAB — COMPREHENSIVE METABOLIC PANEL
ALT: 20 U/L (ref 0–44)
AST: 29 U/L (ref 15–41)
Albumin: 3.1 g/dL — ABNORMAL LOW (ref 3.5–5.0)
Alkaline Phosphatase: 44 U/L (ref 38–126)
Anion gap: 8 (ref 5–15)
BUN: 24 mg/dL — ABNORMAL HIGH (ref 8–23)
CO2: 25 mmol/L (ref 22–32)
Calcium: 9 mg/dL (ref 8.9–10.3)
Chloride: 107 mmol/L (ref 98–111)
Creatinine, Ser: 1.27 mg/dL — ABNORMAL HIGH (ref 0.44–1.00)
GFR calc Af Amer: 48 mL/min — ABNORMAL LOW (ref >60)
GFR calc non Af Amer: 42 mL/min — ABNORMAL LOW (ref >60)
Glucose, Bld: 142 mg/dL — ABNORMAL HIGH (ref 70–99)
Potassium: 4 mmol/L (ref 3.5–5.1)
Sodium: 140 mmol/L (ref 135–145)
Total Bilirubin: 0.4 mg/dL (ref 0.3–1.2)
Total Protein: 7.5 g/dL (ref 6.5–8.1)

## 2019-04-19 MED ORDER — BORTEZOMIB CHEMO SQ INJECTION 3.5 MG (2.5MG/ML)
1.3000 mg/m2 | Freq: Once | INTRAMUSCULAR | Status: AC
  Administered 2019-04-19: 2.25 mg via SUBCUTANEOUS
  Filled 2019-04-19: qty 0.9

## 2019-04-19 MED ORDER — PROCHLORPERAZINE MALEATE 10 MG PO TABS
10.0000 mg | ORAL_TABLET | Freq: Once | ORAL | Status: AC
  Administered 2019-04-19: 12:00:00 10 mg via ORAL
  Filled 2019-04-19: qty 1

## 2019-04-19 MED ORDER — DEXAMETHASONE 4 MG PO TABS
10.0000 mg | ORAL_TABLET | Freq: Once | ORAL | Status: AC
  Administered 2019-04-19: 10 mg via ORAL
  Filled 2019-04-19: qty 3

## 2019-04-19 MED ORDER — CYANOCOBALAMIN 1000 MCG/ML IJ SOLN
INTRAMUSCULAR | Status: AC
  Filled 2019-04-19: qty 1

## 2019-04-19 NOTE — Progress Notes (Signed)
Reviewed labs with Dr. Delton Coombes and ok to treat today.  Reviewed platelet parameters for the patient and as long as the platelets are above 30,000 ok to treat verbal order Dr. Delton Coombes.   Patient tolerated injection with no complaints voiced.  Site clean and dry with no bruising or swelling noted at site.  Band aid applied.  Vss with discharge and left ambulatory with no s/s of distress noted.

## 2019-04-19 NOTE — Patient Instructions (Signed)
[facility] Discharge Instructions for Patients Receiving Chemotherapy  Today you received the following chemotherapy agents  If you develop nausea and vomiting that is not controlled by your nausea medication, call the clinic.   BELOW ARE SYMPTOMS THAT SHOULD BE REPORTED IMMEDIATELY:  *FEVER GREATER THAN 100.5 F  *CHILLS WITH OR WITHOUT FEVER  NAUSEA AND VOMITING THAT IS NOT CONTROLLED WITH YOUR NAUSEA MEDICATION  *UNUSUAL SHORTNESS OF BREATH  *UNUSUAL BRUISING OR BLEEDING  TENDERNESS IN MOUTH AND THROAT WITH OR WITHOUT PRESENCE OF ULCERS  *URINARY PROBLEMS  *BOWEL PROBLEMS  UNUSUAL RASH Items with * indicate a potential emergency and should be followed up as soon as possible.  Feel free to call the clinic should you have any questions or concerns. The clinic phone number is [phone].  Please show the CHEMO ALERT CARD at check-in to the Emergency Department and triage nurse.   

## 2019-04-26 ENCOUNTER — Inpatient Hospital Stay (HOSPITAL_COMMUNITY): Payer: Medicare PPO

## 2019-04-26 ENCOUNTER — Encounter (HOSPITAL_COMMUNITY): Payer: Self-pay

## 2019-04-26 ENCOUNTER — Other Ambulatory Visit: Payer: Self-pay

## 2019-04-26 VITALS — BP 115/43 | HR 75 | Temp 98.2°F | Resp 18 | Wt 130.2 lb

## 2019-04-26 DIAGNOSIS — Z9221 Personal history of antineoplastic chemotherapy: Secondary | ICD-10-CM | POA: Diagnosis not present

## 2019-04-26 DIAGNOSIS — F0390 Unspecified dementia without behavioral disturbance: Secondary | ICD-10-CM | POA: Diagnosis not present

## 2019-04-26 DIAGNOSIS — Z923 Personal history of irradiation: Secondary | ICD-10-CM | POA: Diagnosis not present

## 2019-04-26 DIAGNOSIS — C9000 Multiple myeloma not having achieved remission: Secondary | ICD-10-CM

## 2019-04-26 DIAGNOSIS — Z888 Allergy status to other drugs, medicaments and biological substances status: Secondary | ICD-10-CM | POA: Diagnosis not present

## 2019-04-26 DIAGNOSIS — Z79899 Other long term (current) drug therapy: Secondary | ICD-10-CM | POA: Diagnosis not present

## 2019-04-26 DIAGNOSIS — Z5112 Encounter for antineoplastic immunotherapy: Secondary | ICD-10-CM | POA: Diagnosis not present

## 2019-04-26 DIAGNOSIS — Z8572 Personal history of non-Hodgkin lymphomas: Secondary | ICD-10-CM | POA: Diagnosis not present

## 2019-04-26 DIAGNOSIS — R197 Diarrhea, unspecified: Secondary | ICD-10-CM | POA: Diagnosis not present

## 2019-04-26 LAB — COMPREHENSIVE METABOLIC PANEL
ALT: 21 U/L (ref 0–44)
AST: 32 U/L (ref 15–41)
Albumin: 3.2 g/dL — ABNORMAL LOW (ref 3.5–5.0)
Alkaline Phosphatase: 41 U/L (ref 38–126)
Anion gap: 9 (ref 5–15)
BUN: 24 mg/dL — ABNORMAL HIGH (ref 8–23)
CO2: 25 mmol/L (ref 22–32)
Calcium: 8.8 mg/dL — ABNORMAL LOW (ref 8.9–10.3)
Chloride: 105 mmol/L (ref 98–111)
Creatinine, Ser: 1.24 mg/dL — ABNORMAL HIGH (ref 0.44–1.00)
GFR calc Af Amer: 50 mL/min — ABNORMAL LOW (ref >60)
GFR calc non Af Amer: 43 mL/min — ABNORMAL LOW (ref >60)
Glucose, Bld: 147 mg/dL — ABNORMAL HIGH (ref 70–99)
Potassium: 3.7 mmol/L (ref 3.5–5.1)
Sodium: 139 mmol/L (ref 135–145)
Total Bilirubin: 0.5 mg/dL (ref 0.3–1.2)
Total Protein: 7.1 g/dL (ref 6.5–8.1)

## 2019-04-26 LAB — CBC WITH DIFFERENTIAL/PLATELET
Abs Immature Granulocytes: 0.01 10*3/uL (ref 0.00–0.07)
Basophils Absolute: 0 10*3/uL (ref 0.0–0.1)
Basophils Relative: 1 %
Eosinophils Absolute: 0.1 10*3/uL (ref 0.0–0.5)
Eosinophils Relative: 3 %
HCT: 32 % — ABNORMAL LOW (ref 36.0–46.0)
Hemoglobin: 10.2 g/dL — ABNORMAL LOW (ref 12.0–15.0)
Immature Granulocytes: 0 %
Lymphocytes Relative: 12 %
Lymphs Abs: 0.6 10*3/uL — ABNORMAL LOW (ref 0.7–4.0)
MCH: 30.5 pg (ref 26.0–34.0)
MCHC: 31.9 g/dL (ref 30.0–36.0)
MCV: 95.8 fL (ref 80.0–100.0)
Monocytes Absolute: 0.3 10*3/uL (ref 0.1–1.0)
Monocytes Relative: 6 %
Neutro Abs: 3.6 10*3/uL (ref 1.7–7.7)
Neutrophils Relative %: 78 %
Platelets: 91 10*3/uL — ABNORMAL LOW (ref 150–400)
RBC: 3.34 MIL/uL — ABNORMAL LOW (ref 3.87–5.11)
RDW: 19.6 % — ABNORMAL HIGH (ref 11.5–15.5)
WBC: 4.5 10*3/uL (ref 4.0–10.5)
nRBC: 0 % (ref 0.0–0.2)

## 2019-04-26 MED ORDER — POTASSIUM CHLORIDE CRYS ER 10 MEQ PO TBCR: 30.0000 meq | EXTENDED_RELEASE_TABLET | Freq: Every day | ORAL | 5 refills | Status: DC

## 2019-04-26 MED ORDER — DEXAMETHASONE 4 MG PO TABS
10.0000 mg | ORAL_TABLET | Freq: Once | ORAL | Status: AC
  Administered 2019-04-26: 12:00:00 10 mg via ORAL
  Filled 2019-04-26: qty 3

## 2019-04-26 MED ORDER — PROCHLORPERAZINE MALEATE 10 MG PO TABS: 10.0000 mg | ORAL_TABLET | Freq: Once | ORAL | Status: DC

## 2019-04-26 MED ORDER — BORTEZOMIB CHEMO SQ INJECTION 3.5 MG (2.5MG/ML)
1.3000 mg/m2 | Freq: Once | INTRAMUSCULAR | Status: AC
  Administered 2019-04-26: 2.25 mg via SUBCUTANEOUS
  Filled 2019-04-26: qty 0.9

## 2019-04-26 NOTE — Patient Instructions (Signed)
[facility] Discharge Instructions for Patients Receiving Chemotherapy  Today you received the following chemotherapy agents   To help prevent nausea and vomiting after your treatment, we encourage you to take your nausea medication   If you develop nausea and vomiting that is not controlled by your nausea medication, call the clinic.   BELOW ARE SYMPTOMS THAT SHOULD BE REPORTED IMMEDIATELY:  *FEVER GREATER THAN 100.5 F  *CHILLS WITH OR WITHOUT FEVER  NAUSEA AND VOMITING THAT IS NOT CONTROLLED WITH YOUR NAUSEA MEDICATION  *UNUSUAL SHORTNESS OF BREATH  *UNUSUAL BRUISING OR BLEEDING  TENDERNESS IN MOUTH AND THROAT WITH OR WITHOUT PRESENCE OF ULCERS  *URINARY PROBLEMS  *BOWEL PROBLEMS  UNUSUAL RASH Items with * indicate a potential emergency and should be followed up as soon as possible.  Feel free to call the clinic should you have any questions or concerns. The clinic phone number is [phone].  Please show the CHEMO ALERT CARD at check-in to the Emergency Department and triage nurse.   

## 2019-04-26 NOTE — Progress Notes (Signed)
Labs within parameters for treatment today. No new issues reported by patient today.   Patient is taking revlamid and has not missed any doses and reports no side effects at this time.   Velcade given per orders. Patient tolerated it well without problems. Vitals stable and discharged home from clinic ambulatory. Follow up as scheduled.

## 2019-04-27 MED ORDER — OCTREOTIDE ACETATE 30 MG IM KIT
PACK | INTRAMUSCULAR | Status: AC
  Filled 2019-04-27: qty 1

## 2019-04-30 ENCOUNTER — Other Ambulatory Visit (HOSPITAL_COMMUNITY): Payer: Self-pay | Admitting: Nurse Practitioner

## 2019-05-10 ENCOUNTER — Inpatient Hospital Stay (HOSPITAL_COMMUNITY): Payer: Medicare PPO | Admitting: Hematology

## 2019-05-10 ENCOUNTER — Other Ambulatory Visit: Payer: Self-pay

## 2019-05-10 ENCOUNTER — Encounter (HOSPITAL_COMMUNITY): Payer: Self-pay | Admitting: Hematology

## 2019-05-10 ENCOUNTER — Inpatient Hospital Stay (HOSPITAL_COMMUNITY): Payer: Medicare PPO

## 2019-05-10 VITALS — BP 115/51 | HR 68 | Temp 98.6°F | Resp 16 | Wt 127.3 lb

## 2019-05-10 DIAGNOSIS — C9000 Multiple myeloma not having achieved remission: Secondary | ICD-10-CM

## 2019-05-10 DIAGNOSIS — Z9221 Personal history of antineoplastic chemotherapy: Secondary | ICD-10-CM | POA: Diagnosis not present

## 2019-05-10 DIAGNOSIS — Z8042 Family history of malignant neoplasm of prostate: Secondary | ICD-10-CM

## 2019-05-10 DIAGNOSIS — Z5112 Encounter for antineoplastic immunotherapy: Secondary | ICD-10-CM | POA: Diagnosis not present

## 2019-05-10 DIAGNOSIS — Z809 Family history of malignant neoplasm, unspecified: Secondary | ICD-10-CM

## 2019-05-10 DIAGNOSIS — Z8572 Personal history of non-Hodgkin lymphomas: Secondary | ICD-10-CM

## 2019-05-10 DIAGNOSIS — Z888 Allergy status to other drugs, medicaments and biological substances status: Secondary | ICD-10-CM

## 2019-05-10 DIAGNOSIS — Z79899 Other long term (current) drug therapy: Secondary | ICD-10-CM | POA: Diagnosis not present

## 2019-05-10 DIAGNOSIS — Z923 Personal history of irradiation: Secondary | ICD-10-CM | POA: Diagnosis not present

## 2019-05-10 DIAGNOSIS — R197 Diarrhea, unspecified: Secondary | ICD-10-CM

## 2019-05-10 DIAGNOSIS — Z905 Acquired absence of kidney: Secondary | ICD-10-CM

## 2019-05-10 DIAGNOSIS — Z8249 Family history of ischemic heart disease and other diseases of the circulatory system: Secondary | ICD-10-CM

## 2019-05-10 DIAGNOSIS — F0390 Unspecified dementia without behavioral disturbance: Secondary | ICD-10-CM | POA: Diagnosis not present

## 2019-05-10 DIAGNOSIS — Z833 Family history of diabetes mellitus: Secondary | ICD-10-CM

## 2019-05-10 LAB — COMPREHENSIVE METABOLIC PANEL
ALT: 25 U/L (ref 0–44)
AST: 32 U/L (ref 15–41)
Albumin: 3 g/dL — ABNORMAL LOW (ref 3.5–5.0)
Alkaline Phosphatase: 59 U/L (ref 38–126)
Anion gap: 9 (ref 5–15)
BUN: 20 mg/dL (ref 8–23)
CO2: 24 mmol/L (ref 22–32)
Calcium: 8.9 mg/dL (ref 8.9–10.3)
Chloride: 107 mmol/L (ref 98–111)
Creatinine, Ser: 1.23 mg/dL — ABNORMAL HIGH (ref 0.44–1.00)
GFR calc Af Amer: 50 mL/min — ABNORMAL LOW (ref >60)
GFR calc non Af Amer: 43 mL/min — ABNORMAL LOW (ref >60)
Glucose, Bld: 146 mg/dL — ABNORMAL HIGH (ref 70–99)
Potassium: 3.7 mmol/L (ref 3.5–5.1)
Sodium: 140 mmol/L (ref 135–145)
Total Bilirubin: 0.6 mg/dL (ref 0.3–1.2)
Total Protein: 7.1 g/dL (ref 6.5–8.1)

## 2019-05-10 LAB — CBC WITH DIFFERENTIAL/PLATELET
Abs Immature Granulocytes: 0.02 10*3/uL (ref 0.00–0.07)
Basophils Absolute: 0 10*3/uL (ref 0.0–0.1)
Basophils Relative: 1 %
Eosinophils Absolute: 0.2 10*3/uL (ref 0.0–0.5)
Eosinophils Relative: 5 %
HCT: 29.8 % — ABNORMAL LOW (ref 36.0–46.0)
Hemoglobin: 9.7 g/dL — ABNORMAL LOW (ref 12.0–15.0)
Immature Granulocytes: 0 %
Lymphocytes Relative: 14 %
Lymphs Abs: 0.7 10*3/uL (ref 0.7–4.0)
MCH: 31 pg (ref 26.0–34.0)
MCHC: 32.6 g/dL (ref 30.0–36.0)
MCV: 95.2 fL (ref 80.0–100.0)
Monocytes Absolute: 0.4 10*3/uL (ref 0.1–1.0)
Monocytes Relative: 9 %
Neutro Abs: 3.6 10*3/uL (ref 1.7–7.7)
Neutrophils Relative %: 71 %
Platelets: 123 10*3/uL — ABNORMAL LOW (ref 150–400)
RBC: 3.13 MIL/uL — ABNORMAL LOW (ref 3.87–5.11)
RDW: 18.8 % — ABNORMAL HIGH (ref 11.5–15.5)
WBC: 5 10*3/uL (ref 4.0–10.5)
nRBC: 0 % (ref 0.0–0.2)

## 2019-05-10 MED ORDER — BORTEZOMIB CHEMO SQ INJECTION 3.5 MG (2.5MG/ML)
1.3000 mg/m2 | Freq: Once | INTRAMUSCULAR | Status: AC
  Administered 2019-05-10: 2.25 mg via SUBCUTANEOUS
  Filled 2019-05-10: qty 0.9

## 2019-05-10 MED ORDER — PROCHLORPERAZINE MALEATE 10 MG PO TABS
10.0000 mg | ORAL_TABLET | Freq: Once | ORAL | Status: DC
  Filled 2019-05-10: qty 1

## 2019-05-10 MED ORDER — DEXAMETHASONE 4 MG PO TABS
10.0000 mg | ORAL_TABLET | Freq: Once | ORAL | Status: AC
  Administered 2019-05-10: 10 mg via ORAL
  Filled 2019-05-10: qty 3

## 2019-05-10 NOTE — Progress Notes (Signed)
[name], [place] [address]   CLINIC:  Medical Oncology/Hematology  PCP:  Monico Blitz, MD [place] [address] [phone]   REASON FOR VISIT:  Follow-up for multiple myeloma  CURRENT THERAPY:Velcade, Revlimid, and dexamethasone   BRIEF ONCOLOGIC HISTORY:    Multiple myeloma ([facility])   07/09/2017 Initial Diagnosis    Multiple myeloma ([facility])    06/01/2018 -  Chemotherapy    The patient had bortezomib SQ (VELCADE) chemo injection 1.75 mg, 1 mg/m2 = 1.75 mg (100 % of original dose 1 mg/m2), Subcutaneous,  Once, 13 of 16 cycles Dose modification: 1 mg/m2 (original dose 1 mg/m2, Cycle 1, Reason: Provider Judgment), 1.3 mg/m2 (original dose 1 mg/m2, Cycle 8, Reason: Provider Judgment) Administration: 1.75 mg (06/01/2018), 1.75 mg (06/08/2018), 1.75 mg (06/15/2018), 1.75 mg (06/29/2018), 1.75 mg (07/06/2018), 1.75 mg (07/13/2018), 1.75 mg (07/27/2018), 1.75 mg (08/03/2018), 1.75 mg (08/10/2018), 1.75 mg (08/24/2018), 1.75 mg (08/31/2018), 1.75 mg (09/07/2018), 1.75 mg (09/21/2018), 1.75 mg (09/28/2018), 1.75 mg (10/05/2018), 1.75 mg (10/19/2018), 1.75 mg (10/26/2018), 1.75 mg (11/02/2018), 1.75 mg (11/16/2018), 1.75 mg (11/23/2018), 1.75 mg (11/30/2018), 2.25 mg (12/17/2018), 2.25 mg (12/24/2018), 2.25 mg (01/04/2019), 2.25 mg (01/18/2019), 2.25 mg (01/25/2019), 2.25 mg (02/01/2019), 2.25 mg (02/15/2019), 2.25 mg (02/22/2019), 2.25 mg (03/01/2019), 2.25 mg (03/15/2019), 2.25 mg (03/23/2019), 2.25 mg (03/31/2019), 2.25 mg (04/12/2019), 2.25 mg (04/19/2019), 2.25 mg (04/26/2019), 2.25 mg (05/10/2019)  for chemotherapy treatment.       CANCER STAGING: Cancer Staging No matching staging information was found for the patient.   INTERVAL HISTORY:  [name] 73 y.o. female returns for follow-up and treatment of multiple myeloma.  She is taking Revlimid 2 weeks on 2 weeks off without any problems.  She also takes dexamethasone 10 mg on days of Velcade.  She is tolerating Velcade  very well.  Denies any tingling or numbness extremities.  Denies any headaches or vision changes.  No balance problems reported.  Denies any fevers, night sweats or weight loss in the last 2 months.  Appetite is 100%.  Energy levels are 50%.  No pain is reported at this visit.     REVIEW OF SYSTEMS:  Review of Systems  Gastrointestinal: Negative for diarrhea.  All other systems reviewed and are negative.    PAST MEDICAL/SURGICAL HISTORY:  Past Medical History:  Diagnosis Date  . Benign hypertension   . Cataract   . 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 Diabetes mellitus without complication ([facility])   . H/O partial nephrectomy   . Hypokalemia   . Hypothyroidism   . Impaired cognition   . Multiple myeloma (H[facility])    Dr Maylon Peppers, [facility]  . Thyroid disease    Past Surgical History:  Procedure Laterality Date  . ABDOMINAL HYSTERECTOMY    . CRANIOTOMY     for lymphoma  . YAG LASER APPLICATION Right 1/77/1165   Procedure: YAG LASER APPLICATION;  Surgeon: [name], MD;  Location: [facility];  Service: Ophthalmology;  Laterality: Right;     SOCIAL HISTORY:  Social History   Socioeconomic History  . Marital status: Single    Spouse name: Not on file  . Number of children: Not on file  . Years of education: Not on file  . Highest education level: Not on file  Occupational History  . Not on file  Social Needs  . Financial resource strain: Not on file  . Food insecurity:    Worry: Not on file  Inability: Not on file  . Transportation needs:    Medical: Not on file    Non-medical: Not on file  Tobacco Use  . Smoking status: Never Smoker  . Smokeless tobacco: Never Used  Substance and Sexual Activity  . Alcohol use: No  . Drug use: No  . Sexual activity: Not on file  Lifestyle  . Physical activity:    Days per week: Not on file    Minutes per session: Not on file  . Stress: Not on file  Relationships  . Social connections:    Talks on phone: Not on file     Gets together: Not on file    Attends religious service: Not on file    Active member of club or organization: Not on file    Attends meetings of clubs or organizations: Not on file    Relationship status: Not on file  . Intimate partner violence:    Fear of current or ex partner: Not on file    Emotionally abused: Not on file    Physically abused: Not on file    Forced sexual activity: Not on file  Other Topics Concern  . Not on file  Social History Narrative  . Not on file    FAMILY HISTORY:  Family History  Problem Relation Age of Onset  . Depression Mother   . Diabetes Mother   . Heart disease Father   . Cancer - Prostate Brother   . Cancer Brother   . Cancer Sister     CURRENT MEDICATIONS:  Outpatient Encounter Medications as of 05/10/2019  Medication Sig Note  . ACCU-CHEK SMARTVIEW test strip    . acetaminophen (TYLENOL) 500 MG tablet Take 1,000 mg by mouth every 6 (six) hours as needed for mild pain.    Marland Kitchen acyclovir (ZOVIRAX) 400 MG tablet TAKE ONE TABLET BY MOUTH TWICE DAILY   . amLODipine (NORVASC) 5 MG tablet Take 5 mg by mouth daily.    Marland Kitchen aspirin EC 81 MG tablet Take 81 mg by mouth daily.   . bortezomib IV (VELCADE) 3.5 MG injection Inject into the vein.   . calcium-vitamin D (OSCAL WITH D) 500-200 MG-UNIT tablet Take 1 tablet by mouth daily with breakfast.   . dexamethasone (DECADRON) 4 MG tablet TAKE TWO AND ONE-HALF TABLETS BY MOUTH WEEKLY FOR THREE WEEKS (THREE WEEKS ON, ONE WEEK OFF) IN A ROW AND REPEAT MONTHLY 06/22/2018: Patient states that she is given the dexamethasone while here getting her velcade injection  . donepezil (ARICEPT) 10 MG tablet TAKE 1 TABLET BY MOUTH every evening   . ferrous sulfate 325 (65 FE) MG tablet Take 325 mg by mouth daily with breakfast.   . lenalidomide (REVLIMID) 10 MG capsule Take 1 tablet (10 mg) daily by mouth for 14 days then stop for 14 days.   Marland Kitchen levothyroxine (SYNTHROID, LEVOTHROID) 25 MCG tablet Take 25 mcg by mouth daily  before breakfast.   . losartan (COZAAR) 100 MG tablet Take 100 mg by mouth daily.   . Multiple Vitamins-Minerals (MULTIVITAMIN WOMEN 50+ PO) Take by mouth.   . potassium chloride (K-DUR) 10 MEQ tablet Take 3 tablets (30 mEq total) by mouth daily.    Facility-Administered Encounter Medications as of 05/10/2019  Medication  . prochlorperazine (COMPAZINE) tablet 10 mg    ALLERGIES:  Allergies  Allergen Reactions  . Lotensin [Benazepril Hcl]     Facial swelling (Angioedema) Note: ARBs should also be contraindicated, but she is on Losartan!  PHYSICAL EXAM:  ECOG Performance status: 1  Vitals:   05/10/19 0957  BP: (!) 115/51  Pulse: 68  Resp: 16  Temp: 98.6 F (37 C)  SpO2: 100%   Filed Weights   05/10/19 0957  Weight: 127 lb 4.8 oz (57.7 kg)    Physical Exam Vitals signs reviewed.  Constitutional:      Appearance: Normal appearance.  Cardiovascular:     Rate and Rhythm: Normal rate and regular rhythm.     Heart sounds: Normal heart sounds.  Pulmonary:     Effort: Pulmonary effort is normal.     Breath sounds: Normal breath sounds.  Abdominal:     General: There is no distension.     Palpations: Abdomen is soft. There is no mass.  Musculoskeletal:        General: No swelling.  Skin:    General: Skin is warm.  Neurological:     General: No focal deficit present.     Mental Status: She is alert and oriented to person, place, and time.  Psychiatric:        Mood and Affect: Mood normal.        Behavior: Behavior normal.      LABORATORY DATA:  I have reviewed the labs as listed.  CBC    Component Value Date/Time   WBC 5.0 05/10/2019 0912   RBC 3.13 (L) 05/10/2019 0912   HGB 9.7 (L) 05/10/2019 0912   HCT 29.8 (L) 05/10/2019 0912   PLT 123 (L) 05/10/2019 0912   MCV 95.2 05/10/2019 0912   MCH 31.0 05/10/2019 0912   MCHC 32.6 05/10/2019 0912   RDW 18.8 (H) 05/10/2019 0912   LYMPHSABS 0.7 05/10/2019 0912   MONOABS 0.4 05/10/2019 0912   EOSABS 0.2  05/10/2019 0912   BASOSABS 0.0 05/10/2019 0912   CMP Latest Ref Rng & Units 05/10/2019 04/26/2019 04/19/2019  Glucose 70 - 99 mg/dL 146(H) 147(H) 142(H)  BUN 8 - 23 mg/dL 20 24(H) 24(H)  Creatinine 0.44 - 1.00 mg/dL 1.23(H) 1.24(H) 1.27(H)  Sodium 135 - 145 mmol/L 140 139 140  Potassium 3.5 - 5.1 mmol/L 3.7 3.7 4.0  Chloride 98 - 111 mmol/L 107 105 107  CO2 22 - 32 mmol/L '24 25 25  ' Calcium 8.9 - 10.3 mg/dL 8.9 8.8(L) 9.0  Total Protein 6.5 - 8.1 g/dL 7.1 7.1 7.5  Total Bilirubin 0.3 - 1.2 mg/dL 0.6 0.5 0.4  Alkaline Phos 38 - 126 U/L 59 41 44  AST 15 - 41 U/L 32 32 29  ALT 0 - 44 U/L '25 21 20       ' DIAGNOSTIC IMAGING:  I have independently reviewed the scans and discussed with the patient.     ASSESSMENT & PLAN:   Multiple myeloma (HCC) 1.  IgG lambda multiple myeloma: - Revlimid 42m and dexamethasone 3 weeks on 1 week off started in September 2009, changed to 2 weeks on 2 weeks off in November 2010, changed to 2 weeks on 3 weeks of in May 2012, Revlimid 15 mg 14/35 days and weekly Dex in January 2015, Revlimid 15 mg change to 14/42 days and weekly Dex in June 2017, dexamethasone stopped in November 2017 -She received Zometa monthly starting September 2009, switched to every 3 months in 2012 and lately off of it. -Velcade 1 mg per metered square, 3 weeks on 1 week off started on 06/01/2018.  She takes dexamethasone 10 mg on days of Velcade. -Revlimid 10 mg 2 weeks on, 2 weeks of added  on 08/14/2018 for better response. - Velcade dose increased to 1.3 mg/M square on 12/16/2018. -She is tolerating it very well.  She is taking dexamethasone 10 mg on days of Velcade. -Multiple myeloma panel from 03/15/2019 shows M spike improved to 1.4 g from 1.9 g previously.  Lambda light chains has increased to 126 from 112 previously.  Ratio is 0.4. -She is tolerating current therapy plan very well.  She does not report any neuropathy. -We will see her back in 4 weeks for follow-up. -She was told to  continue acyclovir twice daily.  2.  CNS MALT like lymphoma: -Treated with chemotherapy and radiation, last brain MRI was in 2013, CT of the brain in 2018 with no evidence of recurrence.  She does have dementia.  She will continue Aricept 10 mg daily.   Total time spent is 25 minutes with more than 50% of the time spent explaining treatment plan, side effects and coordination of care.    Orders placed this encounter:  Orders Placed This Encounter  Procedures  . CBC with Differential  . Comprehensive metabolic panel  . Multiple Myeloma Panel (SPEP&IFE w/QIG)  . Kappa/lambda light chains      Derek Jack, [place] [phone]

## 2019-05-10 NOTE — Assessment & Plan Note (Signed)
1.  IgG lambda multiple myeloma: - Revlimid 65m and dexamethasone 3 weeks on 1 week off started in September 2009, changed to 2 weeks on 2 weeks off in November 2010, changed to 2 weeks on 3 weeks of in May 2012, Revlimid 15 mg 14/35 days and weekly Dex in January 2015, Revlimid 15 mg change to 14/42 days and weekly Dex in June 2017, dexamethasone stopped in November 2017 -She received Zometa monthly starting September 2009, switched to every 3 months in 2012 and lately off of it. -Velcade 1 mg per metered square, 3 weeks on 1 week off started on 06/01/2018.  She takes dexamethasone 10 mg on days of Velcade. -Revlimid 10 mg 2 weeks on, 2 weeks of added on 08/14/2018 for better response. - Velcade dose increased to 1.3 mg/M square on 12/16/2018. -She is tolerating it very well.  She is taking dexamethasone 10 mg on days of Velcade. -Multiple myeloma panel from 03/15/2019 shows M spike improved to 1.4 g from 1.9 g previously.  Lambda light chains has increased to 126 from 112 previously.  Ratio is 0.4. -She is tolerating current therapy plan very well.  She does not report any neuropathy. -We will see her back in 4 weeks for follow-up. -She was told to continue acyclovir twice daily.  2.  CNS MALT like lymphoma: -Treated with chemotherapy and radiation, last brain MRI was in 2013, CT of the brain in 2018 with no evidence of recurrence.  She does have dementia.  She will continue Aricept 10 mg daily.

## 2019-05-10 NOTE — Progress Notes (Signed)
Patient seen by Dr. Delton Coombes with lab review and ok for treatment verbal order.   Patient tolerated injection with no complaints voiced.  Site clean and dry with no bruising or swelling noted at site.  Band aid applied.  Vss with discharge and left ambulatory with no s/s of distress noted.

## 2019-05-11 MED ORDER — ALTEPLASE 2 MG IJ SOLR
INTRAMUSCULAR | Status: AC
  Filled 2019-05-11: qty 2

## 2019-05-11 MED ORDER — LANREOTIDE ACETATE 120 MG/0.5ML ~~LOC~~ SOLN
SUBCUTANEOUS | Status: AC
  Filled 2019-05-11: qty 120

## 2019-05-11 MED ORDER — STERILE WATER FOR INJECTION IJ SOLN
INTRAMUSCULAR | Status: AC
  Filled 2019-05-11: qty 10

## 2019-05-12 ENCOUNTER — Other Ambulatory Visit (HOSPITAL_COMMUNITY): Payer: Self-pay | Admitting: *Deleted

## 2019-05-12 DIAGNOSIS — C9000 Multiple myeloma not having achieved remission: Secondary | ICD-10-CM

## 2019-05-12 MED ORDER — LENALIDOMIDE 10 MG PO CAPS: ORAL_CAPSULE | ORAL | 0 refills | Status: DC

## 2019-05-12 NOTE — Telephone Encounter (Signed)
Chart reviewed, revlimid refilled. 

## 2019-05-13 MED ORDER — OCTREOTIDE ACETATE 30 MG IM KIT
PACK | INTRAMUSCULAR | Status: AC
  Filled 2019-05-13: qty 1

## 2019-05-17 ENCOUNTER — Encounter (HOSPITAL_COMMUNITY): Payer: Self-pay

## 2019-05-17 ENCOUNTER — Other Ambulatory Visit: Payer: Self-pay

## 2019-05-17 ENCOUNTER — Inpatient Hospital Stay (HOSPITAL_COMMUNITY): Payer: Medicare PPO

## 2019-05-17 ENCOUNTER — Inpatient Hospital Stay (HOSPITAL_COMMUNITY): Payer: Medicare PPO | Attending: Hematology

## 2019-05-17 VITALS — BP 108/46 | HR 92 | Temp 98.2°F | Resp 16 | Wt 128.4 lb

## 2019-05-17 DIAGNOSIS — D696 Thrombocytopenia, unspecified: Secondary | ICD-10-CM | POA: Insufficient documentation

## 2019-05-17 DIAGNOSIS — Z5112 Encounter for antineoplastic immunotherapy: Secondary | ICD-10-CM | POA: Diagnosis not present

## 2019-05-17 DIAGNOSIS — C9000 Multiple myeloma not having achieved remission: Secondary | ICD-10-CM | POA: Diagnosis not present

## 2019-05-17 DIAGNOSIS — Z8572 Personal history of non-Hodgkin lymphomas: Secondary | ICD-10-CM | POA: Insufficient documentation

## 2019-05-17 LAB — CBC WITH DIFFERENTIAL/PLATELET
Abs Immature Granulocytes: 0.01 10*3/uL (ref 0.00–0.07)
Basophils Absolute: 0 10*3/uL (ref 0.0–0.1)
Basophils Relative: 1 %
Eosinophils Absolute: 0.1 10*3/uL (ref 0.0–0.5)
Eosinophils Relative: 4 %
HCT: 30.2 % — ABNORMAL LOW (ref 36.0–46.0)
Hemoglobin: 9.5 g/dL — ABNORMAL LOW (ref 12.0–15.0)
Immature Granulocytes: 0 %
Lymphocytes Relative: 17 %
Lymphs Abs: 0.6 10*3/uL — ABNORMAL LOW (ref 0.7–4.0)
MCH: 30.6 pg (ref 26.0–34.0)
MCHC: 31.5 g/dL (ref 30.0–36.0)
MCV: 97.4 fL (ref 80.0–100.0)
Monocytes Absolute: 0.5 10*3/uL (ref 0.1–1.0)
Monocytes Relative: 15 %
Neutro Abs: 2.2 10*3/uL (ref 1.7–7.7)
Neutrophils Relative %: 63 %
Platelets: 94 10*3/uL — ABNORMAL LOW (ref 150–400)
RBC: 3.1 MIL/uL — ABNORMAL LOW (ref 3.87–5.11)
RDW: 18.9 % — ABNORMAL HIGH (ref 11.5–15.5)
WBC: 3.5 10*3/uL — ABNORMAL LOW (ref 4.0–10.5)
nRBC: 0 % (ref 0.0–0.2)

## 2019-05-17 LAB — COMPREHENSIVE METABOLIC PANEL
ALT: 19 U/L (ref 0–44)
AST: 28 U/L (ref 15–41)
Albumin: 3 g/dL — ABNORMAL LOW (ref 3.5–5.0)
Alkaline Phosphatase: 44 U/L (ref 38–126)
Anion gap: 9 (ref 5–15)
BUN: 18 mg/dL (ref 8–23)
CO2: 23 mmol/L (ref 22–32)
Calcium: 8.8 mg/dL — ABNORMAL LOW (ref 8.9–10.3)
Chloride: 108 mmol/L (ref 98–111)
Creatinine, Ser: 1.23 mg/dL — ABNORMAL HIGH (ref 0.44–1.00)
GFR calc Af Amer: 50 mL/min — ABNORMAL LOW (ref >60)
GFR calc non Af Amer: 43 mL/min — ABNORMAL LOW (ref >60)
Glucose, Bld: 145 mg/dL — ABNORMAL HIGH (ref 70–99)
Potassium: 3.9 mmol/L (ref 3.5–5.1)
Sodium: 140 mmol/L (ref 135–145)
Total Bilirubin: 0.3 mg/dL (ref 0.3–1.2)
Total Protein: 7 g/dL (ref 6.5–8.1)

## 2019-05-17 MED ORDER — BORTEZOMIB CHEMO SQ INJECTION 3.5 MG (2.5MG/ML)
1.3000 mg/m2 | Freq: Once | INTRAMUSCULAR | Status: AC
  Administered 2019-05-17: 2.25 mg via SUBCUTANEOUS
  Filled 2019-05-17: qty 0.9

## 2019-05-17 MED ORDER — DEXAMETHASONE 4 MG PO TABS
10.0000 mg | ORAL_TABLET | Freq: Once | ORAL | Status: AC
  Administered 2019-05-17: 10 mg via ORAL
  Filled 2019-05-17: qty 3

## 2019-05-17 MED ORDER — PROCHLORPERAZINE MALEATE 10 MG PO TABS: 10.0000 mg | ORAL_TABLET | Freq: Once | ORAL | Status: DC

## 2019-05-17 NOTE — Progress Notes (Signed)
Patient tolerated injection with no complaints voiced.  Site clean and dry with no bruising or swelling noted at site.  Band aid applied.  Vss with discharge and left ambulatory with no s/s of distress noted.  

## 2019-05-18 MED ORDER — OCTREOTIDE ACETATE 30 MG IM KIT
PACK | INTRAMUSCULAR | Status: AC
  Filled 2019-05-18: qty 1

## 2019-05-24 ENCOUNTER — Other Ambulatory Visit: Payer: Self-pay

## 2019-05-24 ENCOUNTER — Inpatient Hospital Stay (HOSPITAL_COMMUNITY): Payer: Medicare PPO

## 2019-05-24 ENCOUNTER — Encounter (HOSPITAL_COMMUNITY): Payer: Self-pay

## 2019-05-24 VITALS — BP 111/38 | HR 65 | Temp 98.2°F | Resp 18 | Wt 127.0 lb

## 2019-05-24 DIAGNOSIS — D696 Thrombocytopenia, unspecified: Secondary | ICD-10-CM | POA: Diagnosis not present

## 2019-05-24 DIAGNOSIS — C9000 Multiple myeloma not having achieved remission: Secondary | ICD-10-CM

## 2019-05-24 DIAGNOSIS — Z8572 Personal history of non-Hodgkin lymphomas: Secondary | ICD-10-CM | POA: Diagnosis not present

## 2019-05-24 DIAGNOSIS — Z5112 Encounter for antineoplastic immunotherapy: Secondary | ICD-10-CM | POA: Diagnosis not present

## 2019-05-24 LAB — CBC WITH DIFFERENTIAL/PLATELET
Abs Immature Granulocytes: 0.02 10*3/uL (ref 0.00–0.07)
Basophils Absolute: 0 10*3/uL (ref 0.0–0.1)
Basophils Relative: 1 %
Eosinophils Absolute: 0.1 10*3/uL (ref 0.0–0.5)
Eosinophils Relative: 3 %
HCT: 32 % — ABNORMAL LOW (ref 36.0–46.0)
Hemoglobin: 9.7 g/dL — ABNORMAL LOW (ref 12.0–15.0)
Immature Granulocytes: 1 %
Lymphocytes Relative: 15 %
Lymphs Abs: 0.7 10*3/uL (ref 0.7–4.0)
MCH: 29.7 pg (ref 26.0–34.0)
MCHC: 30.3 g/dL (ref 30.0–36.0)
MCV: 97.9 fL (ref 80.0–100.0)
Monocytes Absolute: 0.3 10*3/uL (ref 0.1–1.0)
Monocytes Relative: 6 %
Neutro Abs: 3.1 10*3/uL (ref 1.7–7.7)
Neutrophils Relative %: 74 %
Platelets: 94 10*3/uL — ABNORMAL LOW (ref 150–400)
RBC: 3.27 MIL/uL — ABNORMAL LOW (ref 3.87–5.11)
RDW: 19.6 % — ABNORMAL HIGH (ref 11.5–15.5)
WBC: 4.2 10*3/uL (ref 4.0–10.5)
nRBC: 0 % (ref 0.0–0.2)

## 2019-05-24 LAB — COMPREHENSIVE METABOLIC PANEL
ALT: 19 U/L (ref 0–44)
AST: 30 U/L (ref 15–41)
Albumin: 3.1 g/dL — ABNORMAL LOW (ref 3.5–5.0)
Alkaline Phosphatase: 41 U/L (ref 38–126)
Anion gap: 8 (ref 5–15)
BUN: 24 mg/dL — ABNORMAL HIGH (ref 8–23)
CO2: 27 mmol/L (ref 22–32)
Calcium: 8.9 mg/dL (ref 8.9–10.3)
Chloride: 107 mmol/L (ref 98–111)
Creatinine, Ser: 1.25 mg/dL — ABNORMAL HIGH (ref 0.44–1.00)
GFR calc Af Amer: 49 mL/min — ABNORMAL LOW (ref >60)
GFR calc non Af Amer: 42 mL/min — ABNORMAL LOW (ref >60)
Glucose, Bld: 156 mg/dL — ABNORMAL HIGH (ref 70–99)
Potassium: 3.7 mmol/L (ref 3.5–5.1)
Sodium: 142 mmol/L (ref 135–145)
Total Bilirubin: 0.6 mg/dL (ref 0.3–1.2)
Total Protein: 7.2 g/dL (ref 6.5–8.1)

## 2019-05-24 MED ORDER — BORTEZOMIB CHEMO SQ INJECTION 3.5 MG (2.5MG/ML)
1.3000 mg/m2 | Freq: Once | INTRAMUSCULAR | Status: AC
  Administered 2019-05-24: 2.25 mg via SUBCUTANEOUS
  Filled 2019-05-24: qty 0.9

## 2019-05-24 MED ORDER — PROCHLORPERAZINE MALEATE 10 MG PO TABS: 10.0000 mg | ORAL_TABLET | Freq: Once | ORAL | Status: DC

## 2019-05-24 MED ORDER — DEXAMETHASONE 4 MG PO TABS
10.0000 mg | ORAL_TABLET | Freq: Once | ORAL | Status: AC
  Administered 2019-05-24: 10 mg via ORAL
  Filled 2019-05-24: qty 3

## 2019-05-24 NOTE — Progress Notes (Signed)
Patient tolerated injection with no complaints voiced.  Site clean and dry with no bruising or swelling noted at site.  Band aid applied.  Vss with discharge and left ambulatory with no s/s of distress noted.  

## 2019-05-31 ENCOUNTER — Other Ambulatory Visit: Payer: Self-pay

## 2019-05-31 ENCOUNTER — Inpatient Hospital Stay (HOSPITAL_COMMUNITY): Payer: Medicare PPO

## 2019-05-31 DIAGNOSIS — C9000 Multiple myeloma not having achieved remission: Secondary | ICD-10-CM | POA: Diagnosis not present

## 2019-05-31 DIAGNOSIS — D696 Thrombocytopenia, unspecified: Secondary | ICD-10-CM | POA: Diagnosis not present

## 2019-05-31 DIAGNOSIS — Z5112 Encounter for antineoplastic immunotherapy: Secondary | ICD-10-CM | POA: Diagnosis not present

## 2019-05-31 DIAGNOSIS — Z8572 Personal history of non-Hodgkin lymphomas: Secondary | ICD-10-CM | POA: Diagnosis not present

## 2019-05-31 LAB — CBC WITH DIFFERENTIAL/PLATELET
Abs Immature Granulocytes: 0.01 10*3/uL (ref 0.00–0.07)
Basophils Absolute: 0 10*3/uL (ref 0.0–0.1)
Basophils Relative: 1 %
Eosinophils Absolute: 0.2 10*3/uL (ref 0.0–0.5)
Eosinophils Relative: 4 %
HCT: 32.9 % — ABNORMAL LOW (ref 36.0–46.0)
Hemoglobin: 10.4 g/dL — ABNORMAL LOW (ref 12.0–15.0)
Immature Granulocytes: 0 %
Lymphocytes Relative: 17 %
Lymphs Abs: 1 10*3/uL (ref 0.7–4.0)
MCH: 30.2 pg (ref 26.0–34.0)
MCHC: 31.6 g/dL (ref 30.0–36.0)
MCV: 95.6 fL (ref 80.0–100.0)
Monocytes Absolute: 0.6 10*3/uL (ref 0.1–1.0)
Monocytes Relative: 11 %
Neutro Abs: 4 10*3/uL (ref 1.7–7.7)
Neutrophils Relative %: 67 %
Platelets: 89 10*3/uL — ABNORMAL LOW (ref 150–400)
RBC: 3.44 MIL/uL — ABNORMAL LOW (ref 3.87–5.11)
RDW: 19.3 % — ABNORMAL HIGH (ref 11.5–15.5)
WBC: 5.9 10*3/uL (ref 4.0–10.5)
nRBC: 0 % (ref 0.0–0.2)

## 2019-05-31 LAB — COMPREHENSIVE METABOLIC PANEL
ALT: 23 U/L (ref 0–44)
AST: 31 U/L (ref 15–41)
Albumin: 3.1 g/dL — ABNORMAL LOW (ref 3.5–5.0)
Alkaline Phosphatase: 49 U/L (ref 38–126)
Anion gap: 10 (ref 5–15)
BUN: 20 mg/dL (ref 8–23)
CO2: 25 mmol/L (ref 22–32)
Calcium: 8.7 mg/dL — ABNORMAL LOW (ref 8.9–10.3)
Chloride: 107 mmol/L (ref 98–111)
Creatinine, Ser: 1.23 mg/dL — ABNORMAL HIGH (ref 0.44–1.00)
GFR calc Af Amer: 50 mL/min — ABNORMAL LOW (ref >60)
GFR calc non Af Amer: 43 mL/min — ABNORMAL LOW (ref >60)
Glucose, Bld: 85 mg/dL (ref 70–99)
Potassium: 3.9 mmol/L (ref 3.5–5.1)
Sodium: 142 mmol/L (ref 135–145)
Total Bilirubin: 0.5 mg/dL (ref 0.3–1.2)
Total Protein: 7.1 g/dL (ref 6.5–8.1)

## 2019-06-01 LAB — KAPPA/LAMBDA LIGHT CHAINS
Kappa free light chain: 39.6 mg/L — ABNORMAL HIGH (ref 3.3–19.4)
Kappa, lambda light chain ratio: 0.54 (ref 0.26–1.65)
Lambda free light chains: 72.7 mg/L — ABNORMAL HIGH (ref 5.7–26.3)

## 2019-06-01 LAB — MULTIPLE MYELOMA PANEL, SERUM
Albumin SerPl Elph-Mcnc: 2.9 g/dL (ref 2.9–4.4)
Albumin/Glob SerPl: 0.9 (ref 0.7–1.7)
Alpha 1: 0.3 g/dL (ref 0.0–0.4)
Alpha2 Glob SerPl Elph-Mcnc: 0.9 g/dL (ref 0.4–1.0)
B-Globulin SerPl Elph-Mcnc: 0.7 g/dL (ref 0.7–1.3)
Gamma Glob SerPl Elph-Mcnc: 1.8 g/dL (ref 0.4–1.8)
Globulin, Total: 3.6 g/dL (ref 2.2–3.9)
IgA: 107 mg/dL (ref 64–422)
IgG (Immunoglobin G), Serum: 2083 mg/dL — ABNORMAL HIGH (ref 586–1602)
IgM (Immunoglobulin M), Srm: 29 mg/dL (ref 26–217)
M Protein SerPl Elph-Mcnc: 1.2 g/dL — ABNORMAL HIGH
Total Protein ELP: 6.5 g/dL (ref 6.0–8.5)

## 2019-06-02 MED ORDER — PEGFILGRASTIM INJECTION 6 MG/0.6ML ~~LOC~~
PREFILLED_SYRINGE | SUBCUTANEOUS | Status: AC
  Filled 2019-06-02: qty 0.6

## 2019-06-03 MED ORDER — OCTREOTIDE ACETATE 30 MG IM KIT
PACK | INTRAMUSCULAR | Status: AC
  Filled 2019-06-03: qty 1

## 2019-06-07 ENCOUNTER — Inpatient Hospital Stay (HOSPITAL_COMMUNITY): Payer: Medicare PPO

## 2019-06-07 ENCOUNTER — Inpatient Hospital Stay (HOSPITAL_BASED_OUTPATIENT_CLINIC_OR_DEPARTMENT_OTHER): Payer: Medicare PPO | Admitting: Hematology

## 2019-06-07 ENCOUNTER — Other Ambulatory Visit: Payer: Self-pay

## 2019-06-07 ENCOUNTER — Other Ambulatory Visit (HOSPITAL_COMMUNITY): Payer: Medicare PPO

## 2019-06-07 ENCOUNTER — Encounter (HOSPITAL_COMMUNITY): Payer: Self-pay | Admitting: Hematology

## 2019-06-07 DIAGNOSIS — D696 Thrombocytopenia, unspecified: Secondary | ICD-10-CM | POA: Diagnosis not present

## 2019-06-07 DIAGNOSIS — C9000 Multiple myeloma not having achieved remission: Secondary | ICD-10-CM | POA: Diagnosis not present

## 2019-06-07 DIAGNOSIS — Z8572 Personal history of non-Hodgkin lymphomas: Secondary | ICD-10-CM | POA: Diagnosis not present

## 2019-06-07 DIAGNOSIS — Z5112 Encounter for antineoplastic immunotherapy: Secondary | ICD-10-CM | POA: Diagnosis not present

## 2019-06-07 LAB — CBC WITH DIFFERENTIAL/PLATELET
Abs Immature Granulocytes: 0.02 10*3/uL (ref 0.00–0.07)
Basophils Absolute: 0 10*3/uL (ref 0.0–0.1)
Basophils Relative: 1 %
Eosinophils Absolute: 0.2 10*3/uL (ref 0.0–0.5)
Eosinophils Relative: 4 %
HCT: 31 % — ABNORMAL LOW (ref 36.0–46.0)
Hemoglobin: 10.1 g/dL — ABNORMAL LOW (ref 12.0–15.0)
Immature Granulocytes: 0 %
Lymphocytes Relative: 13 %
Lymphs Abs: 0.7 10*3/uL (ref 0.7–4.0)
MCH: 31.4 pg (ref 26.0–34.0)
MCHC: 32.6 g/dL (ref 30.0–36.0)
MCV: 96.3 fL (ref 80.0–100.0)
Monocytes Absolute: 0.5 10*3/uL (ref 0.1–1.0)
Monocytes Relative: 9 %
Neutro Abs: 4 10*3/uL (ref 1.7–7.7)
Neutrophils Relative %: 73 %
Platelets: 111 10*3/uL — ABNORMAL LOW (ref 150–400)
RBC: 3.22 MIL/uL — ABNORMAL LOW (ref 3.87–5.11)
RDW: 18.8 % — ABNORMAL HIGH (ref 11.5–15.5)
WBC: 5.4 10*3/uL (ref 4.0–10.5)
nRBC: 0 % (ref 0.0–0.2)

## 2019-06-07 LAB — COMPREHENSIVE METABOLIC PANEL
ALT: 20 U/L (ref 0–44)
AST: 30 U/L (ref 15–41)
Albumin: 3.1 g/dL — ABNORMAL LOW (ref 3.5–5.0)
Alkaline Phosphatase: 44 U/L (ref 38–126)
Anion gap: 9 (ref 5–15)
BUN: 20 mg/dL (ref 8–23)
CO2: 24 mmol/L (ref 22–32)
Calcium: 8.8 mg/dL — ABNORMAL LOW (ref 8.9–10.3)
Chloride: 108 mmol/L (ref 98–111)
Creatinine, Ser: 1.38 mg/dL — ABNORMAL HIGH (ref 0.44–1.00)
GFR calc Af Amer: 44 mL/min — ABNORMAL LOW (ref >60)
GFR calc non Af Amer: 38 mL/min — ABNORMAL LOW (ref >60)
Glucose, Bld: 164 mg/dL — ABNORMAL HIGH (ref 70–99)
Potassium: 3.6 mmol/L (ref 3.5–5.1)
Sodium: 141 mmol/L (ref 135–145)
Total Bilirubin: 0.5 mg/dL (ref 0.3–1.2)
Total Protein: 7.1 g/dL (ref 6.5–8.1)

## 2019-06-07 MED ORDER — DEXAMETHASONE 4 MG PO TABS
10.0000 mg | ORAL_TABLET | Freq: Once | ORAL | Status: AC
  Administered 2019-06-07: 10 mg via ORAL
  Filled 2019-06-07: qty 3

## 2019-06-07 MED ORDER — PROCHLORPERAZINE MALEATE 10 MG PO TABS: 10.0000 mg | ORAL_TABLET | Freq: Once | ORAL | Status: DC

## 2019-06-07 MED ORDER — BORTEZOMIB CHEMO SQ INJECTION 3.5 MG (2.5MG/ML)
1.3000 mg/m2 | Freq: Once | INTRAMUSCULAR | Status: AC
  Administered 2019-06-07: 2.25 mg via SUBCUTANEOUS
  Filled 2019-06-07: qty 0.9

## 2019-06-07 NOTE — Assessment & Plan Note (Addendum)
1.  IgG lambda multiple myeloma: - Revlimid 60m and dexamethasone 3 weeks on 1 week off started in September 2009, changed to 2 weeks on 2 weeks off in November 2010, changed to 2 weeks on 3 weeks of in May 2012, Revlimid 15 mg 14/35 days and weekly Dex in January 2015, Revlimid 15 mg change to 14/42 days and weekly Dex in June 2017, dexamethasone stopped in November 2017 -She received Zometa monthly starting September 2009, switched to every 3 months in 2012 and lately off of it. -Velcade 1 mg per metered square, 3 weeks on 1 week off started on 06/01/2018.  She takes dexamethasone 10 mg on days of Velcade. -Revlimid 10 mg 2 weeks on, 2 weeks of added on 08/14/2018 for better response. - Velcade dose increased to 1.3 mg/M square on 12/16/2018. -She is tolerating it very well.  She is taking dexamethasone 10 mg on days of Velcade. -Multiple myeloma panel from  05/31/2019 : M- spike continues to improve: Now, 1.2 g. FLC: kappa: 39.6, Lambda: 72.7,  Ratio: 0.54. - Labs are acceptable to proceed with treatment today.   - RTC in 4 weeks.   2.  CNS MALT like lymphoma: -Treated with chemotherapy and radiation, last brain MRI was in 2013, CT of the brain in 2018 with no evidence of recurrence.  She does have dementia.  She will continue Aricept 10 mg daily.  3.  Thrombocytopenia: -Platelet count lately has been slightly low.  Latest count is 89.  Likely therapy related from Revlimid and Velcade.  We will keep a close eye on it.

## 2019-06-07 NOTE — Progress Notes (Signed)
Amanda Wu, [place] [address]   CLINIC:  Medical Oncology/Hematology  PCP:  Amanda Blitz, MD [place] [address] [phone]   REASON FOR VISIT:  Follow-up for Multiple Myeloma   CURRENT THERAPY: Rev/Velcade/Dex  BRIEF ONCOLOGIC HISTORY:  Oncology History  Multiple myeloma ([facility])  07/09/2017 Initial Diagnosis   Multiple myeloma ([facility])   06/01/2018 -  Chemotherapy   The patient had bortezomib SQ (VELCADE) chemo injection 1.75 mg, 1 mg/m2 = 1.75 mg (100 % of original dose 1 mg/m2), Subcutaneous,  Once, 14 of 17 cycles Dose modification: 1 mg/m2 (original dose 1 mg/m2, Cycle 1, Reason: Provider Judgment), 1.3 mg/m2 (original dose 1 mg/m2, Cycle 8, Reason: Provider Judgment) Administration: 1.75 mg (06/01/2018), 1.75 mg (06/08/2018), 1.75 mg (06/15/2018), 1.75 mg (06/29/2018), 1.75 mg (07/06/2018), 1.75 mg (07/13/2018), 1.75 mg (07/27/2018), 1.75 mg (08/03/2018), 1.75 mg (08/10/2018), 1.75 mg (08/24/2018), 1.75 mg (08/31/2018), 1.75 mg (09/07/2018), 1.75 mg (09/21/2018), 1.75 mg (09/28/2018), 1.75 mg (10/05/2018), 1.75 mg (10/19/2018), 1.75 mg (10/26/2018), 1.75 mg (11/02/2018), 1.75 mg (11/16/2018), 1.75 mg (11/23/2018), 1.75 mg (11/30/2018), 2.25 mg (12/17/2018), 2.25 mg (12/24/2018), 2.25 mg (01/04/2019), 2.25 mg (01/18/2019), 2.25 mg (01/25/2019), 2.25 mg (02/01/2019), 2.25 mg (02/15/2019), 2.25 mg (02/22/2019), 2.25 mg (03/01/2019), 2.25 mg (03/15/2019), 2.25 mg (03/23/2019), 2.25 mg (03/31/2019), 2.25 mg (04/12/2019), 2.25 mg (04/19/2019), 2.25 mg (04/26/2019), 2.25 mg (05/10/2019), 2.25 mg (05/17/2019), 2.25 mg (05/24/2019), 2.25 mg (06/07/2019)  for chemotherapy treatment.        INTERVAL HISTORY:  [name] 73 y.o. female presents today for follow up. Reports overall doing well. Denies any significant fatigue. Currently on treatment with Revlimid, Velcade, Dex, tolerating well. Denies any changes in bowel habits. No constipation or diarrhea. Denies peripheral neuropathies.  Denies any fevers, chills, nigght sweats. States she is ready to proceed with treatment today.  Denies ER visits or hospitalizations.  REVIEW OF SYSTEMS:  Review of Systems  All other systems reviewed and are negative.    PAST MEDICAL/SURGICAL HISTORY:  Past Medical History:  Diagnosis Date  . Benign hypertension   . Cataract   . 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 Diabetes mellitus without complication ([facility])   . H/O partial nephrectomy   . Hypokalemia   . Hypothyroidism   . Impaired cognition   . Multiple myeloma (H[facility])    Dr Amanda Wu, [facility]  . Thyroid disease    Past Surgical History:  Procedure Laterality Date  . ABDOMINAL HYSTERECTOMY    . CRANIOTOMY     for lymphoma  . YAG LASER APPLICATION Right 02/26/9457   Procedure: YAG LASER APPLICATION;  Surgeon: Amanda Che, MD;  Location: [facility];  Service: Ophthalmology;  Laterality: Right;     SOCIAL HISTORY:  Social History   Socioeconomic History  . Marital status: Single    Spouse name: Not on file  . Number of children: Not on file  . Years of education: Not on file  . Highest education level: Not on file  Occupational History  . Not on file  Social Needs  . Financial resource strain: Not on file  . Food insecurity    Worry: Not on file    Inability: Not on file  . Transportation needs    Medical: Not on file    Non-medical: Not on file  Tobacco Use  . Smoking status: Never Smoker  . Smokeless tobacco: Never Used  Substance and Sexual Activity  . Alcohol use: No  . Drug use:  No  . Sexual activity: Not on file  Lifestyle  . Physical activity    Days per week: Not on file    Minutes per session: Not on file  . Stress: Not on file  Relationships  . Social Herbalist on phone: Not on file    Gets together: Not on file    Attends religious service: Not on file    Active member of club or organization: Not on file    Attends meetings of clubs or organizations: Not on file     Relationship status: Not on file  . Intimate partner violence    Fear of current or ex partner: Not on file    Emotionally abused: Not on file    Physically abused: Not on file    Forced sexual activity: Not on file  Other Topics Concern  . Not on file  Social History Narrative  . Not on file    FAMILY HISTORY:  Family History  Problem Relation Age of Onset  . Depression Mother   . Diabetes Mother   . Heart disease Father   . Cancer - Prostate Brother   . Cancer Brother   . Cancer Sister     CURRENT MEDICATIONS:  Outpatient Encounter Medications as of 06/07/2019  Medication Sig Note  . ACCU-CHEK SMARTVIEW test strip    . acetaminophen (TYLENOL) 500 MG tablet Take 1,000 mg by mouth every 6 (six) hours as needed for mild pain.    Marland Kitchen acyclovir (ZOVIRAX) 400 MG tablet TAKE ONE TABLET BY MOUTH TWICE DAILY   . amLODipine (NORVASC) 5 MG tablet Take 5 mg by mouth daily.    Marland Kitchen aspirin EC 81 MG tablet Take 81 mg by mouth daily.   . bortezomib IV (VELCADE) 3.5 MG injection Inject into the vein.   . calcium-vitamin D (OSCAL WITH D) 500-200 MG-UNIT tablet Take 1 tablet by mouth daily with breakfast.   . donepezil (ARICEPT) 10 MG tablet TAKE 1 TABLET BY MOUTH every evening   . ferrous sulfate 325 (65 FE) MG tablet Take 325 mg by mouth daily with breakfast.   . lenalidomide (REVLIMID) 10 MG capsule Take 1 tablet (10 mg) daily by mouth for 14 days then stop for 14 days.   Marland Kitchen levothyroxine (SYNTHROID, LEVOTHROID) 25 MCG tablet Take 25 mcg by mouth daily before breakfast.   . losartan (COZAAR) 100 MG tablet Take 100 mg by mouth daily.   . Multiple Vitamins-Minerals (MULTIVITAMIN WOMEN 50+ PO) Take by mouth.   . potassium chloride (K-DUR) 10 MEQ tablet Take 3 tablets (30 mEq total) by mouth daily.   Marland Kitchen dexamethasone (DECADRON) 4 MG tablet TAKE TWO AND ONE-HALF TABLETS BY MOUTH WEEKLY FOR THREE WEEKS (THREE WEEKS ON, ONE WEEK OFF) IN A ROW AND REPEAT MONTHLY 06/22/2018: Patient states that she is given  the dexamethasone while here getting her velcade injection   Facility-Administered Encounter Medications as of 06/07/2019  Medication  . prochlorperazine (COMPAZINE) tablet 10 mg    ALLERGIES:  Allergies  Allergen Reactions  . Lotensin [Benazepril Hcl]     Facial swelling (Angioedema) Note: ARBs should also be contraindicated, but she is on Losartan!     PHYSICAL EXAM:  ECOG Performance status: 3  Vitals:   06/07/19 1000  BP: (!) 100/40  Pulse: 63  Resp: 18  Temp: 98.3 F (36.8 C)  SpO2: 100%   Filed Weights   06/07/19 1000  Weight: 126 lb 7 oz (57.4 kg)  Physical Exam Vitals signs reviewed.  Constitutional:      Appearance: Normal appearance.  HENT:     Head: Normocephalic.     Nose: Nose normal.     Mouth/Throat:     Mouth: Mucous membranes are moist.     Pharynx: Oropharynx is clear.  Eyes:     Extraocular Movements: Extraocular movements intact.     Conjunctiva/sclera: Conjunctivae normal.  Neck:     Musculoskeletal: Normal range of motion.  Cardiovascular:     Rate and Rhythm: Normal rate and regular rhythm.     Pulses: Normal pulses.     Heart sounds: Normal heart sounds.  Pulmonary:     Effort: Pulmonary effort is normal.     Breath sounds: Normal breath sounds.  Abdominal:     General: Bowel sounds are normal.     Palpations: Abdomen is soft.  Musculoskeletal: Normal range of motion.  Skin:    General: Skin is warm and dry.  Neurological:     Mental Status: She is alert. Mental status is at baseline.  Psychiatric:        Mood and Affect: Mood normal.        Behavior: Behavior normal.      LABORATORY DATA:  I have reviewed the labs as listed.  CBC    Component Value Date/Time   WBC 5.4 06/07/2019 0957   RBC 3.22 (L) 06/07/2019 0957   HGB 10.1 (L) 06/07/2019 0957   HCT 31.0 (L) 06/07/2019 0957   PLT 111 (L) 06/07/2019 0957   MCV 96.3 06/07/2019 0957   MCH 31.4 06/07/2019 0957   MCHC 32.6 06/07/2019 0957   RDW 18.8 (H)  06/07/2019 0957   LYMPHSABS 0.7 06/07/2019 0957   MONOABS 0.5 06/07/2019 0957   EOSABS 0.2 06/07/2019 0957   BASOSABS 0.0 06/07/2019 0957   CMP Latest Ref Rng & Units 06/07/2019 05/31/2019 05/24/2019  Glucose 70 - 99 mg/dL 164(H) 85 156(H)  BUN 8 - 23 mg/dL 20 20 24(H)  Creatinine 0.44 - 1.00 mg/dL 1.38(H) 1.23(H) 1.25(H)  Sodium 135 - 145 mmol/L 141 142 142  Potassium 3.5 - 5.1 mmol/L 3.6 3.9 3.7  Chloride 98 - 111 mmol/L 108 107 107  CO2 22 - 32 mmol/L '24 25 27  ' Calcium 8.9 - 10.3 mg/dL 8.8(L) 8.7(L) 8.9  Total Protein 6.5 - 8.1 g/dL 7.1 7.1 7.2  Total Bilirubin 0.3 - 1.2 mg/dL 0.5 0.5 0.6  Alkaline Phos 38 - 126 U/L 44 49 41  AST 15 - 41 U/L '30 31 30  ' ALT 0 - 44 U/L '20 23 19          ' ASSESSMENT & PLAN:   Multiple myeloma (HCC) 1.  IgG lambda multiple myeloma: - Revlimid 53m and dexamethasone 3 weeks on 1 week off started in September 2009, changed to 2 weeks on 2 weeks off in November 2010, changed to 2 weeks on 3 weeks of in May 2012, Revlimid 15 mg 14/35 days and weekly Dex in January 2015, Revlimid 15 mg change to 14/42 days and weekly Dex in June 2017, dexamethasone stopped in November 2017 -She received Zometa monthly starting September 2009, switched to every 3 months in 2012 and lately off of it. -Velcade 1 mg per metered square, 3 weeks on 1 week off started on 06/01/2018.  She takes dexamethasone 10 mg on days of Velcade. -Revlimid 10 mg 2 weeks on, 2 weeks of added on 08/14/2018 for better response. - Velcade dose increased to 1.3 mg/M  square on 12/16/2018. -She is tolerating it very well.  She is taking dexamethasone 10 mg on days of Velcade. -Multiple myeloma panel from  05/31/2019 : M- spike continues to improve: Now, 1.2 g. FLC: kappa: 39.6, Lambda: 72.7,  Ratio: 0.54. - Labs are acceptable to proceed with treatment today.   - RTC in 4 weeks.   2.  CNS MALT like lymphoma: -Treated with chemotherapy and radiation, last brain MRI was in 2013, CT of the brain in 2018  with no evidence of recurrence.  She does have dementia.  She will continue Aricept 10 mg daily.  3.  Thrombocytopenia: -Platelet count lately has been slightly low.  Latest count is 89.  Likely therapy related from Revlimid and Velcade.  We will keep a close eye on it.  Total time spent is 25 minutes with more than 50% of the time spent face-to-face discussing treatment plan and coordination of care.    Orders placed this encounter:  Orders Placed This Encounter  Procedures  . CBC with Differential  . Comprehensive metabolic panel  . Multiple Myeloma Panel (SPEP&IFE w/QIG)  . Kappa/lambda light chains      Derek Jack, [place] [phone]

## 2019-06-07 NOTE — Patient Instructions (Signed)
Multiple Myeloma is responding to treatment!! We will continue with current regimen.

## 2019-06-07 NOTE — Progress Notes (Signed)
Patient seen by Dr. Katragadda with lab review and ok to treat today verbal order.   Patient tolerated injection with no complaints voiced.  Site clean and dry with no bruising or swelling noted at site.  Band aid applied.  Vss with discharge and left ambulatory with no s/s of distress noted.  

## 2019-06-08 ENCOUNTER — Telehealth (HOSPITAL_COMMUNITY): Payer: Self-pay | Admitting: *Deleted

## 2019-06-08 ENCOUNTER — Other Ambulatory Visit (HOSPITAL_COMMUNITY): Payer: Self-pay | Admitting: *Deleted

## 2019-06-08 DIAGNOSIS — C9000 Multiple myeloma not having achieved remission: Secondary | ICD-10-CM

## 2019-06-08 MED ORDER — PEGFILGRASTIM INJECTION 6 MG/0.6ML ~~LOC~~
PREFILLED_SYRINGE | SUBCUTANEOUS | Status: AC
  Filled 2019-06-08: qty 0.6

## 2019-06-08 MED ORDER — LENALIDOMIDE 10 MG PO CAPS: ORAL_CAPSULE | ORAL | 0 refills | Status: DC

## 2019-06-08 NOTE — Telephone Encounter (Signed)
Patient's ister called into clinic stating patient was having episodes of diarrhea due to the infusion from yesterday. Provider notified and recommended pt take 2 tablets of Imodium with the first episode of loose stool and 1 tablet after each loose stool. Pt sister was also instructed not exceed 8 tablets in a 24 hour period. Patient's sister Deneise Lever verbalized understanding.

## 2019-06-14 ENCOUNTER — Inpatient Hospital Stay (HOSPITAL_COMMUNITY): Payer: Medicare PPO

## 2019-06-14 ENCOUNTER — Other Ambulatory Visit: Payer: Self-pay

## 2019-06-14 VITALS — BP 118/51 | HR 71 | Temp 97.3°F | Resp 16 | Wt 127.6 lb

## 2019-06-14 DIAGNOSIS — Z8572 Personal history of non-Hodgkin lymphomas: Secondary | ICD-10-CM | POA: Diagnosis not present

## 2019-06-14 DIAGNOSIS — C9000 Multiple myeloma not having achieved remission: Secondary | ICD-10-CM

## 2019-06-14 DIAGNOSIS — D696 Thrombocytopenia, unspecified: Secondary | ICD-10-CM | POA: Diagnosis not present

## 2019-06-14 DIAGNOSIS — Z5112 Encounter for antineoplastic immunotherapy: Secondary | ICD-10-CM | POA: Diagnosis not present

## 2019-06-14 LAB — CBC WITH DIFFERENTIAL/PLATELET
Abs Immature Granulocytes: 0.01 10*3/uL (ref 0.00–0.07)
Basophils Absolute: 0 10*3/uL (ref 0.0–0.1)
Basophils Relative: 1 %
Eosinophils Absolute: 0.1 10*3/uL (ref 0.0–0.5)
Eosinophils Relative: 3 %
HCT: 30.4 % — ABNORMAL LOW (ref 36.0–46.0)
Hemoglobin: 10 g/dL — ABNORMAL LOW (ref 12.0–15.0)
Immature Granulocytes: 0 %
Lymphocytes Relative: 20 %
Lymphs Abs: 0.7 10*3/uL (ref 0.7–4.0)
MCH: 31.6 pg (ref 26.0–34.0)
MCHC: 32.9 g/dL (ref 30.0–36.0)
MCV: 96.2 fL (ref 80.0–100.0)
Monocytes Absolute: 0.7 10*3/uL (ref 0.1–1.0)
Monocytes Relative: 18 %
Neutro Abs: 2.1 10*3/uL (ref 1.7–7.7)
Neutrophils Relative %: 58 %
Platelets: 74 10*3/uL — ABNORMAL LOW (ref 150–400)
RBC: 3.16 MIL/uL — ABNORMAL LOW (ref 3.87–5.11)
RDW: 19 % — ABNORMAL HIGH (ref 11.5–15.5)
WBC: 3.6 10*3/uL — ABNORMAL LOW (ref 4.0–10.5)
nRBC: 0 % (ref 0.0–0.2)

## 2019-06-14 LAB — COMPREHENSIVE METABOLIC PANEL
ALT: 20 U/L (ref 0–44)
AST: 30 U/L (ref 15–41)
Albumin: 3.1 g/dL — ABNORMAL LOW (ref 3.5–5.0)
Alkaline Phosphatase: 43 U/L (ref 38–126)
Anion gap: 5 (ref 5–15)
BUN: 21 mg/dL (ref 8–23)
CO2: 26 mmol/L (ref 22–32)
Calcium: 8.9 mg/dL (ref 8.9–10.3)
Chloride: 109 mmol/L (ref 98–111)
Creatinine, Ser: 1.15 mg/dL — ABNORMAL HIGH (ref 0.44–1.00)
GFR calc Af Amer: 54 mL/min — ABNORMAL LOW (ref >60)
GFR calc non Af Amer: 47 mL/min — ABNORMAL LOW (ref >60)
Glucose, Bld: 95 mg/dL (ref 70–99)
Potassium: 4 mmol/L (ref 3.5–5.1)
Sodium: 140 mmol/L (ref 135–145)
Total Bilirubin: 0.6 mg/dL (ref 0.3–1.2)
Total Protein: 7.2 g/dL (ref 6.5–8.1)

## 2019-06-14 MED ORDER — DEXAMETHASONE 4 MG PO TABS
10.0000 mg | ORAL_TABLET | Freq: Once | ORAL | Status: AC
  Administered 2019-06-14: 10 mg via ORAL
  Filled 2019-06-14: qty 3

## 2019-06-14 MED ORDER — BORTEZOMIB CHEMO SQ INJECTION 3.5 MG (2.5MG/ML)
1.3000 mg/m2 | Freq: Once | INTRAMUSCULAR | Status: AC
  Administered 2019-06-14: 2.25 mg via SUBCUTANEOUS
  Filled 2019-06-14: qty 0.9

## 2019-06-14 MED ORDER — PROCHLORPERAZINE MALEATE 10 MG PO TABS: 10.0000 mg | ORAL_TABLET | Freq: Once | ORAL | Status: DC

## 2019-06-14 NOTE — Patient Instructions (Signed)
[facility] Discharge Instructions for Patients Receiving Chemotherapy   Beginning January 23rd 2017 lab work for the Cancer Center will be done in the  Main lab at [facility] on 1st floor. If you have a lab appointment with the Cancer Center please come in thru the  Main Entrance and check in at the main information desk   Today you received the following chemotherapy agents Velcade  To help prevent nausea and vomiting after your treatment, we encourage you to take your nausea medication    If you develop nausea and vomiting, or diarrhea that is not controlled by your medication, call the clinic.  The clinic phone number is [phone]. Office hours are Monday-Friday 8:30am-5:00pm.  BELOW ARE SYMPTOMS THAT SHOULD BE REPORTED IMMEDIATELY:  *FEVER GREATER THAN 101.0 F  *CHILLS WITH OR WITHOUT FEVER  NAUSEA AND VOMITING THAT IS NOT CONTROLLED WITH YOUR NAUSEA MEDICATION  *UNUSUAL SHORTNESS OF BREATH  *UNUSUAL BRUISING OR BLEEDING  TENDERNESS IN MOUTH AND THROAT WITH OR WITHOUT PRESENCE OF ULCERS  *URINARY PROBLEMS  *BOWEL PROBLEMS  UNUSUAL RASH Items with * indicate a potential emergency and should be followed up as soon as possible. If you have an emergency after office hours please contact your primary care physician or go to the nearest emergency department.  Please call the clinic during office hours if you have any questions or concerns.   You may also contact the Patient Navigator at [phone] should you have any questions or need assistance in obtaining follow up care.      Resources For Cancer Patients and their Caregivers ? American Cancer Society: Can assist with transportation, wigs, general needs, runs Look Good Feel Better.        1-888-227-6333 ? Cancer Care: Provides financial assistance, online support groups, medication/co-pay assistance.  1-800-813-HOPE (4673) ? [facility] Assists [place] cancer  patients and their families through emotional , educational and financial support.  336-427-4357 ? [place] DSS Where to apply for food stamps, Medicaid and utility assistance. 336-342-1394 ? [facility]: Transportation to medical appointments. 336-347-2287 ? Social Security Administration: May apply for disability if have a Stage IV cancer. 336-342-7796 1-800-772-1213 ? [place] Aging, Disability and Transit Services: Assists with nutrition, care and transit needs. 336-349-2343          

## 2019-06-14 NOTE — Progress Notes (Signed)
1:58 PM lab work reviewed with Dr. Delton Coombes, including platelets 74. Ok to proceed with Velcade per MD.  [place] [facility] tolerated Velcade injection without incident or complaint. Discharged self ambulatory in satisfactory condition.

## 2019-06-15 MED ORDER — OCTREOTIDE ACETATE 20 MG IM KIT
PACK | INTRAMUSCULAR | Status: AC
  Filled 2019-06-15: qty 1

## 2019-06-15 MED ORDER — OCTREOTIDE ACETATE 30 MG IM KIT
PACK | INTRAMUSCULAR | Status: AC
  Filled 2019-06-15: qty 1

## 2019-06-16 MED ORDER — PEGFILGRASTIM-CBQV 6 MG/0.6ML ~~LOC~~ SOSY
PREFILLED_SYRINGE | SUBCUTANEOUS | Status: AC
  Filled 2019-06-16: qty 0.6

## 2019-06-16 MED ORDER — LANREOTIDE ACETATE 120 MG/0.5ML ~~LOC~~ SOLN
SUBCUTANEOUS | Status: AC
  Filled 2019-06-16: qty 120

## 2019-06-21 ENCOUNTER — Inpatient Hospital Stay (HOSPITAL_COMMUNITY): Payer: Medicare PPO | Attending: Hematology

## 2019-06-21 ENCOUNTER — Inpatient Hospital Stay (HOSPITAL_COMMUNITY): Payer: Medicare PPO

## 2019-06-21 ENCOUNTER — Other Ambulatory Visit: Payer: Self-pay

## 2019-06-21 ENCOUNTER — Encounter (HOSPITAL_COMMUNITY): Payer: Self-pay

## 2019-06-21 VITALS — BP 117/54 | HR 58 | Temp 97.8°F | Resp 16

## 2019-06-21 DIAGNOSIS — Z8572 Personal history of non-Hodgkin lymphomas: Secondary | ICD-10-CM | POA: Insufficient documentation

## 2019-06-21 DIAGNOSIS — D696 Thrombocytopenia, unspecified: Secondary | ICD-10-CM | POA: Insufficient documentation

## 2019-06-21 DIAGNOSIS — Z5112 Encounter for antineoplastic immunotherapy: Secondary | ICD-10-CM | POA: Diagnosis not present

## 2019-06-21 DIAGNOSIS — C9000 Multiple myeloma not having achieved remission: Secondary | ICD-10-CM | POA: Diagnosis not present

## 2019-06-21 LAB — COMPREHENSIVE METABOLIC PANEL
ALT: 19 U/L (ref 0–44)
AST: 28 U/L (ref 15–41)
Albumin: 3.1 g/dL — ABNORMAL LOW (ref 3.5–5.0)
Alkaline Phosphatase: 44 U/L (ref 38–126)
Anion gap: 8 (ref 5–15)
BUN: 21 mg/dL (ref 8–23)
CO2: 27 mmol/L (ref 22–32)
Calcium: 9 mg/dL (ref 8.9–10.3)
Chloride: 105 mmol/L (ref 98–111)
Creatinine, Ser: 1.18 mg/dL — ABNORMAL HIGH (ref 0.44–1.00)
GFR calc Af Amer: 53 mL/min — ABNORMAL LOW (ref >60)
GFR calc non Af Amer: 45 mL/min — ABNORMAL LOW (ref >60)
Glucose, Bld: 83 mg/dL (ref 70–99)
Potassium: 4.3 mmol/L (ref 3.5–5.1)
Sodium: 140 mmol/L (ref 135–145)
Total Bilirubin: 0.5 mg/dL (ref 0.3–1.2)
Total Protein: 7 g/dL (ref 6.5–8.1)

## 2019-06-21 LAB — CBC WITH DIFFERENTIAL/PLATELET
Abs Immature Granulocytes: 0.01 10*3/uL (ref 0.00–0.07)
Basophils Absolute: 0 10*3/uL (ref 0.0–0.1)
Basophils Relative: 1 %
Eosinophils Absolute: 0.2 10*3/uL (ref 0.0–0.5)
Eosinophils Relative: 3 %
HCT: 32.7 % — ABNORMAL LOW (ref 36.0–46.0)
Hemoglobin: 10.2 g/dL — ABNORMAL LOW (ref 12.0–15.0)
Immature Granulocytes: 0 %
Lymphocytes Relative: 14 %
Lymphs Abs: 0.7 10*3/uL (ref 0.7–4.0)
MCH: 29.9 pg (ref 26.0–34.0)
MCHC: 31.2 g/dL (ref 30.0–36.0)
MCV: 95.9 fL (ref 80.0–100.0)
Monocytes Absolute: 0.4 10*3/uL (ref 0.1–1.0)
Monocytes Relative: 7 %
Neutro Abs: 4 10*3/uL (ref 1.7–7.7)
Neutrophils Relative %: 75 %
Platelets: 94 10*3/uL — ABNORMAL LOW (ref 150–400)
RBC: 3.41 MIL/uL — ABNORMAL LOW (ref 3.87–5.11)
RDW: 19.2 % — ABNORMAL HIGH (ref 11.5–15.5)
WBC: 5.3 10*3/uL (ref 4.0–10.5)
nRBC: 0 % (ref 0.0–0.2)

## 2019-06-21 MED ORDER — BORTEZOMIB CHEMO SQ INJECTION 3.5 MG (2.5MG/ML)
1.3000 mg/m2 | Freq: Once | INTRAMUSCULAR | Status: AC
  Administered 2019-06-21: 2.25 mg via SUBCUTANEOUS
  Filled 2019-06-21: qty 0.9

## 2019-06-21 MED ORDER — PROCHLORPERAZINE MALEATE 10 MG PO TABS: 10.0000 mg | ORAL_TABLET | Freq: Once | ORAL | Status: DC

## 2019-06-21 MED ORDER — DEXAMETHASONE 4 MG PO TABS
10.0000 mg | ORAL_TABLET | Freq: Once | ORAL | Status: AC
  Administered 2019-06-21: 10 mg via ORAL
  Filled 2019-06-21: qty 3

## 2019-06-21 MED ORDER — OCTREOTIDE ACETATE 20 MG IM KIT
PACK | INTRAMUSCULAR | Status: AC
  Filled 2019-06-21: qty 1

## 2019-06-21 MED ORDER — DENOSUMAB 120 MG/1.7ML ~~LOC~~ SOLN
SUBCUTANEOUS | Status: AC
  Filled 2019-06-21: qty 1.7

## 2019-06-21 NOTE — Progress Notes (Signed)
Patient tolerated injection with no complaints voiced.  Site clean and dry with no bruising or swelling noted at site.  Band aid applied.  Vss with discharge and left ambulatory with no s/s of distress noted.  

## 2019-06-22 MED ORDER — FULVESTRANT 250 MG/5ML IM SOLN
INTRAMUSCULAR | Status: AC
  Filled 2019-06-22: qty 5

## 2019-06-22 MED ORDER — OCTREOTIDE ACETATE 30 MG IM KIT
PACK | INTRAMUSCULAR | Status: AC
  Filled 2019-06-22: qty 1

## 2019-06-23 MED ORDER — FULVESTRANT 250 MG/5ML IM SOLN
INTRAMUSCULAR | Status: AC
  Filled 2019-06-23: qty 5

## 2019-07-05 ENCOUNTER — Inpatient Hospital Stay (HOSPITAL_COMMUNITY): Payer: Medicare PPO

## 2019-07-05 ENCOUNTER — Other Ambulatory Visit: Payer: Self-pay

## 2019-07-05 ENCOUNTER — Other Ambulatory Visit (HOSPITAL_COMMUNITY): Payer: Self-pay | Admitting: *Deleted

## 2019-07-05 ENCOUNTER — Inpatient Hospital Stay (HOSPITAL_BASED_OUTPATIENT_CLINIC_OR_DEPARTMENT_OTHER): Payer: Medicare PPO | Admitting: Hematology

## 2019-07-05 ENCOUNTER — Encounter (HOSPITAL_COMMUNITY): Payer: Self-pay | Admitting: Hematology

## 2019-07-05 VITALS — BP 106/44 | HR 65 | Temp 97.3°F | Resp 18 | Wt 126.0 lb

## 2019-07-05 DIAGNOSIS — Z5112 Encounter for antineoplastic immunotherapy: Secondary | ICD-10-CM | POA: Diagnosis not present

## 2019-07-05 DIAGNOSIS — Z8572 Personal history of non-Hodgkin lymphomas: Secondary | ICD-10-CM | POA: Diagnosis not present

## 2019-07-05 DIAGNOSIS — C9000 Multiple myeloma not having achieved remission: Secondary | ICD-10-CM

## 2019-07-05 DIAGNOSIS — D696 Thrombocytopenia, unspecified: Secondary | ICD-10-CM | POA: Diagnosis not present

## 2019-07-05 LAB — COMPREHENSIVE METABOLIC PANEL
ALT: 20 U/L (ref 0–44)
AST: 31 U/L (ref 15–41)
Albumin: 3.1 g/dL — ABNORMAL LOW (ref 3.5–5.0)
Alkaline Phosphatase: 50 U/L (ref 38–126)
Anion gap: 7 (ref 5–15)
BUN: 23 mg/dL (ref 8–23)
CO2: 24 mmol/L (ref 22–32)
Calcium: 8.7 mg/dL — ABNORMAL LOW (ref 8.9–10.3)
Chloride: 108 mmol/L (ref 98–111)
Creatinine, Ser: 1.25 mg/dL — ABNORMAL HIGH (ref 0.44–1.00)
GFR calc Af Amer: 49 mL/min — ABNORMAL LOW (ref >60)
GFR calc non Af Amer: 42 mL/min — ABNORMAL LOW (ref >60)
Glucose, Bld: 93 mg/dL (ref 70–99)
Potassium: 4 mmol/L (ref 3.5–5.1)
Sodium: 139 mmol/L (ref 135–145)
Total Bilirubin: 0.5 mg/dL (ref 0.3–1.2)
Total Protein: 7.2 g/dL (ref 6.5–8.1)

## 2019-07-05 LAB — CBC WITH DIFFERENTIAL/PLATELET
Abs Immature Granulocytes: 0.01 10*3/uL (ref 0.00–0.07)
Basophils Absolute: 0 10*3/uL (ref 0.0–0.1)
Basophils Relative: 1 %
Eosinophils Absolute: 0.2 10*3/uL (ref 0.0–0.5)
Eosinophils Relative: 4 %
HCT: 30.4 % — ABNORMAL LOW (ref 36.0–46.0)
Hemoglobin: 9.7 g/dL — ABNORMAL LOW (ref 12.0–15.0)
Immature Granulocytes: 0 %
Lymphocytes Relative: 21 %
Lymphs Abs: 1.1 10*3/uL (ref 0.7–4.0)
MCH: 30.6 pg (ref 26.0–34.0)
MCHC: 31.9 g/dL (ref 30.0–36.0)
MCV: 95.9 fL (ref 80.0–100.0)
Monocytes Absolute: 0.6 10*3/uL (ref 0.1–1.0)
Monocytes Relative: 12 %
Neutro Abs: 3.1 10*3/uL (ref 1.7–7.7)
Neutrophils Relative %: 62 %
Platelets: 135 10*3/uL — ABNORMAL LOW (ref 150–400)
RBC: 3.17 MIL/uL — ABNORMAL LOW (ref 3.87–5.11)
RDW: 18.3 % — ABNORMAL HIGH (ref 11.5–15.5)
WBC: 5 10*3/uL (ref 4.0–10.5)
nRBC: 0 % (ref 0.0–0.2)

## 2019-07-05 MED ORDER — LENALIDOMIDE 10 MG PO CAPS: ORAL_CAPSULE | ORAL | 0 refills | Status: DC

## 2019-07-05 MED ORDER — DEXAMETHASONE 4 MG PO TABS
ORAL_TABLET | ORAL | Status: AC
  Filled 2019-07-05: qty 3

## 2019-07-05 MED ORDER — PROCHLORPERAZINE MALEATE 10 MG PO TABS: 10.0000 mg | ORAL_TABLET | Freq: Once | ORAL | Status: DC

## 2019-07-05 MED ORDER — DEXAMETHASONE 4 MG PO TABS
10.0000 mg | ORAL_TABLET | Freq: Once | ORAL | Status: AC
  Administered 2019-07-05: 10 mg via ORAL

## 2019-07-05 MED ORDER — BORTEZOMIB CHEMO SQ INJECTION 3.5 MG (2.5MG/ML)
1.3000 mg/m2 | Freq: Once | INTRAMUSCULAR | Status: AC
  Administered 2019-07-05: 2.25 mg via SUBCUTANEOUS
  Filled 2019-07-05: qty 0.9

## 2019-07-05 NOTE — Assessment & Plan Note (Signed)
1.  IgG lambda multiple myeloma: - Revlimid 25 mg and dexamethasone 3 weeks on 1 week off started in September 2009, discontinued in November 2017 - Velcade 1 mg per metered square 3 weeks on 1 week off started on 06/01/2018, dose increased to 1.3 mg per metered square on 12/16/2018.  She takes dexamethasone 10 mg on days of Velcade. -Revlimid 10 mg 2 weeks on 2 weeks off restarted on 08/14/2017 for better response. - Myeloma panel on 05/31/2019 shows M spike of 1.2 g, continues to improve.  Free light chain ratio is 0.54 with lambda light chains down to 72.7. -He is continuing to tolerate Velcade very well without any peripheral neuropathy.  She is also tolerating Revlimid well.  She does have occasional diarrhea. - She may proceed with her Velcade today.  She will come back in 4 weeks for follow-up.  I plan to repeat myeloma panel at that time.  2.  Bone strengthening: -She received Zometa monthly starting September 2009, switched to every 3 months in 2012 and has been off of it for the last several years.  3.  CNS MALT like lymphoma: -Treated with chemotherapy and radiation, last brain MRI was in 2013, CT of the brain in 2018 with no evidence of recurrence. As she continues Aricept 10 mg daily for dementia.  4.  Thrombocytopenia: -Platelet count improved to 135 on the latest CBC.   

## 2019-07-05 NOTE — Patient Instructions (Signed)
[facility] at [facility] Discharge Instructions  You were seen today by Dr. Katragadda. He went over your recent lab results. He will see you back in 4 weeks for labs and follow up.   Thank you for choosing [facility] at [facility] to provide your oncology and hematology care.  To afford each patient quality time with our provider, please arrive at least 15 minutes before your scheduled appointment time.   If you have a lab appointment with the Cancer Center please come in thru the  Main Entrance and check in at the main information desk  You need to re-schedule your appointment should you arrive 10 or more minutes late.  We strive to give you quality time with our providers, and arriving late affects you and other patients whose appointments are after yours.  Also, if you no show three or more times for appointments you may be dismissed from the clinic at the providers discretion.     Again, thank you for choosing [facility].  Our hope is that these requests will decrease the amount of time that you wait before being seen by our physicians.       _____________________________________________________________  Should you have questions after your visit to [facility], please contact our office at [phone] between the hours of 8:00 a.m. and 4:30 p.m.  Voicemails left after 4:00 p.m. will not be returned until the following business day.  For prescription refill requests, have your pharmacy contact our office and allow 72 hours.    Cancer Center Support Programs:   > Cancer Support Group  2nd Tuesday of the month 1pm-2pm, Journey Room    

## 2019-07-05 NOTE — Progress Notes (Signed)
Amanda Wu tolerated Velcade injection without incident or complaint. VSS. Discharged self ambulatory in satisfactory condition.

## 2019-07-05 NOTE — Progress Notes (Signed)
Amanda Wu, [name]   CLINIC:  Medical Oncology/Hematology  PCP:  Amanda Blitz, Amanda Wu [place] [address] [phone]   REASON FOR VISIT:  Follow-up for Multiple Myeloma   CURRENT THERAPY: Rev/Velcade/Dex  BRIEF ONCOLOGIC HISTORY:  Oncology History  Multiple myeloma ([facility] [place])  07/09/2017 Initial Diagnosis   Multiple myeloma ([place])   06/01/2018 -  Chemotherapy   The patient had bortezomib SQ (VELCADE) chemo injection 1.75 mg, 1 mg/m2 = 1.75 mg (100 % of original dose 1 mg/m2), Subcutaneous,  Once, 14 of 17 cycles Dose modification: 1 mg/m2 (original dose 1 mg/m2, Cycle 1, Reason: Provider Judgment), 1.3 mg/m2 (original dose 1 mg/m2, Cycle 8, Reason: Provider Judgment) Administration: 1.75 mg (06/01/2018), 1.75 mg (06/08/2018), 1.75 mg (06/15/2018), 1.75 mg (06/29/2018), 1.75 mg (07/06/2018), 1.75 mg (07/13/2018), 1.75 mg (07/27/2018), 1.75 mg (08/03/2018), 1.75 mg (08/10/2018), 1.75 mg (08/24/2018), 1.75 mg (08/31/2018), 1.75 mg (09/07/2018), 1.75 mg (09/21/2018), 1.75 mg (09/28/2018), 1.75 mg (10/05/2018), 1.75 mg (10/19/2018), 1.75 mg (10/26/2018), 1.75 mg (11/02/2018), 1.75 mg (11/16/2018), 1.75 mg (11/23/2018), 1.75 mg (11/30/2018), 2.25 mg (12/17/2018), 2.25 mg (12/24/2018), 2.25 mg (01/04/2019), 2.25 mg (01/18/2019), 2.25 mg (01/25/2019), 2.25 mg (02/01/2019), 2.25 mg (02/15/2019), 2.25 mg (02/22/2019), 2.25 mg (03/01/2019), 2.25 mg (03/15/2019), 2.25 mg (03/23/2019), 2.25 mg (03/31/2019), 2.25 mg (04/12/2019), 2.25 mg (04/19/2019), 2.25 mg (04/26/2019), 2.25 mg (05/10/2019), 2.25 mg (05/17/2019), 2.25 mg (05/24/2019), 2.25 mg (06/07/2019), 2.25 mg (06/14/2019), 2.25 mg (06/21/2019)  for chemotherapy treatment.        INTERVAL HISTORY:  [name] 73 y.o. female seen for follow-up of multiple myeloma.  She is taking Revlimid 2 weeks on 2 weeks off.  This week is her off week.  She is tolerating it reasonably well.  She did have occasional bouts of diarrhea with it.  Denies  any tingling or numbness in extremities.  Appetite is 75%.  Energy levels are 75%.  She is continuing to take acyclovir twice daily.  No fevers or chills noted.  REVIEW OF SYSTEMS:  Review of Systems  Gastrointestinal: Positive for diarrhea.  All other systems reviewed and are negative.    PAST MEDICAL/SURGICAL HISTORY:  Past Medical History:  Diagnosis Date  . Benign hypertension   . Cataract   . 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 Diabetes mellitus without complication ([facility])   . H/O partial nephrectomy   . Hypokalemia   . Hypothyroidism   . Impaired cognition   . Multiple myeloma (H[facility])    Amanda Wu, [facility]  . Thyroid disease    Past Surgical History:  Procedure Laterality Date  . ABDOMINAL HYSTERECTOMY    . CRANIOTOMY     for lymphoma  . YAG LASER APPLICATION Right 3/32/9518   Procedure: YAG LASER APPLICATION;  Surgeon: Amanda Che, Amanda Wu;  Location: [facility];  Service: Ophthalmology;  Laterality: Right;     SOCIAL HISTORY:  Social History   Socioeconomic History  . Marital status: Single    Spouse name: Not on file  . Number of children: Not on file  . Years of education: Not on file  . Highest education level: Not on file  Occupational History  . Not on file  Social Needs  . Financial resource strain: Not on file  . Food insecurity    Worry: Not on file    Inability: Not on file  . Transportation needs    Medical: Not on file    Non-medical: Not on file  Tobacco Use  . Smoking status: Never Smoker  . Smokeless tobacco: Never Used  Substance and Sexual Activity  . Alcohol use: No  . Drug use: No  . Sexual activity: Not on file  Lifestyle  . Physical activity    Days per week: Not on file    Minutes per session: Not on file  . Stress: Not on file  Relationships  . Social Herbalist on phone: Not on file    Gets together: Not on file    Attends religious service: Not on file    Active member of club or organization: Not on file     Attends meetings of clubs or organizations: Not on file    Relationship status: Not on file  . Intimate partner violence    Fear of current or ex partner: Not on file    Emotionally abused: Not on file    Physically abused: Not on file    Forced sexual activity: Not on file  Other Topics Concern  . Not on file  Social History Narrative  . Not on file    FAMILY HISTORY:  Family History  Problem Relation Age of Onset  . Depression Mother   . Diabetes Mother   . Heart disease Father   . Cancer - Prostate Brother   . Cancer Brother   . Cancer Sister     CURRENT MEDICATIONS:  Outpatient Encounter Medications as of 07/05/2019  Medication Sig Note  . ACCU-CHEK SMARTVIEW test strip    . acetaminophen (TYLENOL) 500 MG tablet Take 1,000 mg by mouth every 6 (six) hours as needed for mild pain.    Marland Kitchen acyclovir (ZOVIRAX) 400 MG tablet TAKE ONE TABLET BY MOUTH TWICE DAILY   . amLODipine (NORVASC) 5 MG tablet Take 5 mg by mouth daily.    Marland Kitchen aspirin EC 81 MG tablet Take 81 mg by mouth daily.   . bortezomib IV (VELCADE) 3.5 MG injection Inject into the vein.   . calcium-vitamin D (OSCAL WITH D) 500-200 MG-UNIT tablet Take 1 tablet by mouth daily with breakfast.   . dexamethasone (DECADRON) 4 MG tablet TAKE TWO AND ONE-HALF TABLETS BY MOUTH WEEKLY FOR THREE WEEKS (THREE WEEKS ON, ONE WEEK OFF) IN A ROW AND REPEAT MONTHLY 06/22/2018: Patient states that she is given the dexamethasone while here getting her velcade injection  . donepezil (ARICEPT) 10 MG tablet TAKE 1 TABLET BY MOUTH every evening   . ferrous sulfate 325 (65 FE) MG tablet Take 325 mg by mouth daily with breakfast.   . lenalidomide (REVLIMID) 10 MG capsule Take 1 tablet (10 mg) daily by mouth for 14 days then stop for 14 days.   Marland Kitchen levothyroxine (SYNTHROID, LEVOTHROID) 25 MCG tablet Take 25 mcg by mouth daily before breakfast.   . losartan (COZAAR) 100 MG tablet Take 100 mg by mouth daily.   . Multiple Vitamins-Minerals (MULTIVITAMIN  WOMEN 50+ PO) Take by mouth.   . potassium chloride (K-DUR) 10 MEQ tablet Take 3 tablets (30 mEq total) by mouth daily.    Facility-Administered Encounter Medications as of 07/05/2019  Medication  . prochlorperazine (COMPAZINE) tablet 10 mg    ALLERGIES:  Allergies  Allergen Reactions  . Lotensin [Benazepril Hcl]     Facial swelling (Angioedema) Note: ARBs should also be contraindicated, but she is on Losartan!     PHYSICAL EXAM:  ECOG Performance status: 3  Vitals:   07/05/19 1524  BP: (!) 106/44  Pulse: 65  Resp:  18  Temp: (!) 97.3 F (36.3 C)  SpO2: 100%   Filed Weights   07/05/19 1524  Weight: 126 lb (57.2 kg)    Physical Exam Vitals signs reviewed.  Constitutional:      Appearance: Normal appearance.  HENT:     Head: Normocephalic.     Nose: Nose normal.     Mouth/Throat:     Mouth: Mucous membranes are moist.     Pharynx: Oropharynx is clear.  Eyes:     Extraocular Movements: Extraocular movements intact.     Conjunctiva/sclera: Conjunctivae normal.  Neck:     Musculoskeletal: Normal range of motion.  Cardiovascular:     Rate and Rhythm: Normal rate and regular rhythm.     Pulses: Normal pulses.     Heart sounds: Normal heart sounds.  Pulmonary:     Effort: Pulmonary effort is normal.     Breath sounds: Normal breath sounds.  Abdominal:     General: Bowel sounds are normal.     Palpations: Abdomen is soft.  Musculoskeletal: Normal range of motion.  Skin:    General: Skin is warm and dry.  Neurological:     Mental Status: She is alert. Mental status is at baseline.  Psychiatric:        Mood and Affect: Mood normal.        Behavior: Behavior normal.      LABORATORY DATA:  I have reviewed the labs as listed.  CBC    Component Value Date/Time   WBC 5.0 07/05/2019 1415   RBC 3.17 (L) 07/05/2019 1415   HGB 9.7 (L) 07/05/2019 1415   HCT 30.4 (L) 07/05/2019 1415   PLT 135 (L) 07/05/2019 1415   MCV 95.9 07/05/2019 1415   MCH 30.6  07/05/2019 1415   MCHC 31.9 07/05/2019 1415   RDW 18.3 (H) 07/05/2019 1415   LYMPHSABS 1.1 07/05/2019 1415   MONOABS 0.6 07/05/2019 1415   EOSABS 0.2 07/05/2019 1415   BASOSABS 0.0 07/05/2019 1415   CMP Latest Ref Rng & Units 07/05/2019 06/21/2019 06/14/2019  Glucose 70 - 99 mg/dL 93 83 95  BUN 8 - 23 mg/dL _0 Creatinine 0.44 - 1.00 mg/dL 1.25(H) 1.18(H) 1.15(H)  Sodium 135 - 145 mmol/L 139 140 140  Potassium 3.5 - 5.1 mmol/L 4.0 4.3 4.0  Chloride 98 - 111 mmol/L 108 105 109  CO2 22 - 32 mmol/L _1 Calcium 8.9 - 10.3 mg/dL 8.7(L) 9.0 8.9  Total Protein 6.5 - 8.1 g/dL 7.2 7.0 7.2  Total Bilirubin 0.3 - 1.2 mg/dL 0.5 0.5 0.6  Alkaline Phos 38 - 126 U/L 50 44 43  AST 15 - 41 U/L _2 ALT 0 - 44 U/L _3 ASSESSMENT & PLAN:   Multiple myeloma (HCC) 1.  IgG lambda multiple myeloma: - Revlimid 25 mg and dexamethasone 3 weeks on 1 week off started in September 2009, discontinued in November 2017 - Velcade 1 mg per metered square 3 weeks on 1 week off started on 06/01/2018, dose increased to 1.3 mg per metered square on 12/16/2018.  She takes dexamethasone 10 mg on days of Velcade. -Revlimid 10 mg 2 weeks on 2 weeks off restarted on 08/14/2017 for better response. - Myeloma panel on 05/31/2019 shows M spike of 1.2 g, continues to improve.  Free light chain ratio is 0.54 with lambda light chains down to 72.7. -He is continuing to tolerate  Velcade very well without any peripheral neuropathy.  She is also tolerating Revlimid well.  She does have occasional diarrhea. - She may proceed with her Velcade today.  She will come back in 4 weeks for follow-up.  I plan to repeat myeloma panel at that time.  2.  Bone strengthening: -She received Zometa monthly starting September 2009, switched to every 3 months in 2012 and has been off of it for the last several years.  3.  CNS MALT like lymphoma: -Treated with chemotherapy and radiation, last brain MRI was in 2013, CT of  the brain in 2018 with no evidence of recurrence. As she continues Aricept 10 mg daily for dementia.  4.  Thrombocytopenia: -Platelet count improved to 135 on the latest CBC.    Total time spent is 25 minutes with more than 50% of the time spent face-to-face discussing treatment plan and coordination of care.    Orders placed this encounter:  Orders Placed This Encounter  Procedures  . CBC with Differential/Platelet  . Comprehensive metabolic panel  . Protein electrophoresis, serum  . Kappa/lambda light chains  . Lactate dehydrogenase      Derek Jack, Amanda Wu [place] [phone]

## 2019-07-05 NOTE — Addendum Note (Signed)
Addended by: Derek Jack on: 07/05/2019 04:10 PM   Modules accepted: Orders

## 2019-07-05 NOTE — Patient Instructions (Signed)
[facility] Discharge Instructions for Patients Receiving Chemotherapy   Beginning January 23rd 2017 lab work for the Cancer Center will be done in the  Main lab at [place] on 1st floor. If you have a lab appointment with the Cancer Center please come in thru the  Main Entrance and check in at the main information desk   Today you received the following chemotherapy agents Velcade  To help prevent nausea and vomiting after your treatment, we encourage you to take your nausea medication    If you develop nausea and vomiting, or diarrhea that is not controlled by your medication, call the clinic.  The clinic phone number is [phone]. Office hours are Monday-Friday 8:30am-5:00pm.  BELOW ARE SYMPTOMS THAT SHOULD BE REPORTED IMMEDIATELY:  *FEVER GREATER THAN 101.0 F  *CHILLS WITH OR WITHOUT FEVER  NAUSEA AND VOMITING THAT IS NOT CONTROLLED WITH YOUR NAUSEA MEDICATION  *UNUSUAL SHORTNESS OF BREATH  *UNUSUAL BRUISING OR BLEEDING  TENDERNESS IN MOUTH AND THROAT WITH OR WITHOUT PRESENCE OF ULCERS  *URINARY PROBLEMS  *BOWEL PROBLEMS  UNUSUAL RASH Items with * indicate a potential emergency and should be followed up as soon as possible. If you have an emergency after office hours please contact your primary care physician or go to the nearest emergency department.  Please call the clinic during office hours if you have any questions or concerns.   You may also contact the Patient Navigator at [phone] should you have any questions or need assistance in obtaining follow up care.      Resources For Cancer Patients and their Caregivers ? American Cancer Society: Can assist with transportation, wigs, general needs, runs Look Good Feel Better.        1-888-227-6333 ? Cancer Care: Provides financial assistance, online support groups, medication/co-pay assistance.  1-800-813-HOPE (4673) ? [facility] Assists [place] cancer  patients and their families through emotional , educational and financial support.  336-427-4357 ? [place] DSS Where to apply for food stamps, Medicaid and utility assistance. 336-342-1394 ? [facility]: Transportation to medical appointments. 336-347-2287 ? Social Security Administration: May apply for disability if have a Stage IV cancer. 336-342-7796 1-800-772-1213 ? [place] Aging, Disability and Transit Services: Assists with nutrition, care and transit needs. 336-349-2343          

## 2019-07-06 LAB — KAPPA/LAMBDA LIGHT CHAINS
Kappa free light chain: 44.5 mg/L — ABNORMAL HIGH (ref 3.3–19.4)
Kappa, lambda light chain ratio: 0.65 (ref 0.26–1.65)
Lambda free light chains: 68.2 mg/L — ABNORMAL HIGH (ref 5.7–26.3)

## 2019-07-07 LAB — MULTIPLE MYELOMA PANEL, SERUM
Albumin SerPl Elph-Mcnc: 2.9 g/dL (ref 2.9–4.4)
Albumin/Glob SerPl: 0.9 (ref 0.7–1.7)
Alpha 1: 0.3 g/dL (ref 0.0–0.4)
Alpha2 Glob SerPl Elph-Mcnc: 0.8 g/dL (ref 0.4–1.0)
B-Globulin SerPl Elph-Mcnc: 0.6 g/dL — ABNORMAL LOW (ref 0.7–1.3)
Gamma Glob SerPl Elph-Mcnc: 1.7 g/dL (ref 0.4–1.8)
Globulin, Total: 3.4 g/dL (ref 2.2–3.9)
IgA: 124 mg/dL (ref 64–422)
IgG (Immunoglobin G), Serum: 2022 mg/dL — ABNORMAL HIGH (ref 586–1602)
IgM (Immunoglobulin M), Srm: 28 mg/dL (ref 26–217)
M Protein SerPl Elph-Mcnc: 1.1 g/dL — ABNORMAL HIGH
Total Protein ELP: 6.3 g/dL (ref 6.0–8.5)

## 2019-07-12 ENCOUNTER — Inpatient Hospital Stay (HOSPITAL_COMMUNITY): Payer: Medicare PPO

## 2019-07-12 ENCOUNTER — Encounter (HOSPITAL_COMMUNITY): Payer: Self-pay

## 2019-07-12 ENCOUNTER — Other Ambulatory Visit: Payer: Self-pay

## 2019-07-12 VITALS — BP 110/62 | HR 52 | Temp 97.3°F | Resp 18 | Wt 125.2 lb

## 2019-07-12 DIAGNOSIS — C9000 Multiple myeloma not having achieved remission: Secondary | ICD-10-CM

## 2019-07-12 DIAGNOSIS — D696 Thrombocytopenia, unspecified: Secondary | ICD-10-CM | POA: Diagnosis not present

## 2019-07-12 DIAGNOSIS — Z5112 Encounter for antineoplastic immunotherapy: Secondary | ICD-10-CM | POA: Diagnosis not present

## 2019-07-12 DIAGNOSIS — Z8572 Personal history of non-Hodgkin lymphomas: Secondary | ICD-10-CM | POA: Diagnosis not present

## 2019-07-12 LAB — COMPREHENSIVE METABOLIC PANEL
ALT: 21 U/L (ref 0–44)
AST: 32 U/L (ref 15–41)
Albumin: 3.1 g/dL — ABNORMAL LOW (ref 3.5–5.0)
Alkaline Phosphatase: 43 U/L (ref 38–126)
Anion gap: 9 (ref 5–15)
BUN: 22 mg/dL (ref 8–23)
CO2: 25 mmol/L (ref 22–32)
Calcium: 9 mg/dL (ref 8.9–10.3)
Chloride: 107 mmol/L (ref 98–111)
Creatinine, Ser: 1.21 mg/dL — ABNORMAL HIGH (ref 0.44–1.00)
GFR calc Af Amer: 51 mL/min — ABNORMAL LOW (ref >60)
GFR calc non Af Amer: 44 mL/min — ABNORMAL LOW (ref >60)
Glucose, Bld: 115 mg/dL — ABNORMAL HIGH (ref 70–99)
Potassium: 3.8 mmol/L (ref 3.5–5.1)
Sodium: 141 mmol/L (ref 135–145)
Total Bilirubin: 0.5 mg/dL (ref 0.3–1.2)
Total Protein: 6.9 g/dL (ref 6.5–8.1)

## 2019-07-12 LAB — CBC WITH DIFFERENTIAL/PLATELET
Abs Immature Granulocytes: 0 10*3/uL (ref 0.00–0.07)
Basophils Absolute: 0 10*3/uL (ref 0.0–0.1)
Basophils Relative: 1 %
Eosinophils Absolute: 0.1 10*3/uL (ref 0.0–0.5)
Eosinophils Relative: 3 %
HCT: 31.6 % — ABNORMAL LOW (ref 36.0–46.0)
Hemoglobin: 10 g/dL — ABNORMAL LOW (ref 12.0–15.0)
Immature Granulocytes: 0 %
Lymphocytes Relative: 19 %
Lymphs Abs: 0.6 10*3/uL — ABNORMAL LOW (ref 0.7–4.0)
MCH: 30.7 pg (ref 26.0–34.0)
MCHC: 31.6 g/dL (ref 30.0–36.0)
MCV: 96.9 fL (ref 80.0–100.0)
Monocytes Absolute: 0.5 10*3/uL (ref 0.1–1.0)
Monocytes Relative: 15 %
Neutro Abs: 2.1 10*3/uL (ref 1.7–7.7)
Neutrophils Relative %: 62 %
Platelets: 81 10*3/uL — ABNORMAL LOW (ref 150–400)
RBC: 3.26 MIL/uL — ABNORMAL LOW (ref 3.87–5.11)
RDW: 18.8 % — ABNORMAL HIGH (ref 11.5–15.5)
WBC: 3.4 10*3/uL — ABNORMAL LOW (ref 4.0–10.5)
nRBC: 0 % (ref 0.0–0.2)

## 2019-07-12 LAB — LACTATE DEHYDROGENASE: LDH: 158 U/L (ref 98–192)

## 2019-07-12 MED ORDER — PROCHLORPERAZINE MALEATE 10 MG PO TABS: 10.0000 mg | ORAL_TABLET | Freq: Once | ORAL | Status: DC

## 2019-07-12 MED ORDER — BORTEZOMIB CHEMO SQ INJECTION 3.5 MG (2.5MG/ML)
1.3000 mg/m2 | Freq: Once | INTRAMUSCULAR | Status: AC
  Administered 2019-07-12: 2.25 mg via SUBCUTANEOUS
  Filled 2019-07-12: qty 0.9

## 2019-07-12 MED ORDER — DEXAMETHASONE 4 MG PO TABS
10.0000 mg | ORAL_TABLET | Freq: Once | ORAL | Status: AC
  Administered 2019-07-12: 10 mg via ORAL
  Filled 2019-07-12: qty 3

## 2019-07-12 NOTE — Patient Instructions (Signed)
[facility] Discharge Instructions for Patients Receiving Chemotherapy   Beginning January 23rd 2017 lab work for the Cancer Center will be done in the  Main lab at [place] on 1st floor. If you have a lab appointment with the Cancer Center please come in thru the  Main Entrance and check in at the main information desk   Today you received the following chemotherapy agents Velcade injection. Follow-up as scheduled. Call clinic for any questions or concerns  To help prevent nausea and vomiting after your treatment, we encourage you to take your nausea medication   If you develop nausea and vomiting, or diarrhea that is not controlled by your medication, call the clinic.  The clinic phone number is [phone]. Office hours are Monday-Friday 8:30am-5:00pm.  BELOW ARE SYMPTOMS THAT SHOULD BE REPORTED IMMEDIATELY:  *FEVER GREATER THAN 101.0 F  *CHILLS WITH OR WITHOUT FEVER  NAUSEA AND VOMITING THAT IS NOT CONTROLLED WITH YOUR NAUSEA MEDICATION  *UNUSUAL SHORTNESS OF BREATH  *UNUSUAL BRUISING OR BLEEDING  TENDERNESS IN MOUTH AND THROAT WITH OR WITHOUT PRESENCE OF ULCERS  *URINARY PROBLEMS  *BOWEL PROBLEMS  UNUSUAL RASH Items with * indicate a potential emergency and should be followed up as soon as possible. If you have an emergency after office hours please contact your primary care physician or go to the nearest emergency department.  Please call the clinic during office hours if you have any questions or concerns.   You may also contact the Patient Navigator at [phone] should you have any questions or need assistance in obtaining follow up care.      Resources For Cancer Patients and their Caregivers ? American Cancer Society: Can assist with transportation, wigs, general needs, runs Look Good Feel Better.        1-888-227-6333 ? Cancer Care: Provides financial assistance, online support groups, medication/co-pay assistance.   1-800-813-HOPE (4673) ? [facility] Assists [place] cancer patients and their families through emotional , educational and financial support.  336-427-4357 ? [place] DSS Where to apply for food stamps, Medicaid and utility assistance. 336-342-1394 ? [facility]: Transportation to medical appointments. 336-347-2287 ? Social Security Administration: May apply for disability if have a Stage IV cancer. 336-342-7796 1-800-772-1213 ? [place] Aging, Disability and Transit Services: Assists with nutrition, care and transit needs. 336-349-2343         

## 2019-07-12 NOTE — Progress Notes (Signed)
Amanda Wu tolerated Velcade injection well without complaints or incident. Labs reviewed prior to administering this medication. VSS Pt discharged self ambulatory in satisfactory condition

## 2019-07-13 LAB — PROTEIN ELECTROPHORESIS, SERUM
A/G Ratio: 0.9 (ref 0.7–1.7)
Albumin ELP: 3.1 g/dL (ref 2.9–4.4)
Alpha-1-Globulin: 0.2 g/dL (ref 0.0–0.4)
Alpha-2-Globulin: 0.9 g/dL (ref 0.4–1.0)
Beta Globulin: 0.6 g/dL — ABNORMAL LOW (ref 0.7–1.3)
Gamma Globulin: 1.7 g/dL (ref 0.4–1.8)
Globulin, Total: 3.4 g/dL (ref 2.2–3.9)
M-Spike, %: 1.1 g/dL — ABNORMAL HIGH
Total Protein ELP: 6.5 g/dL (ref 6.0–8.5)

## 2019-07-13 LAB — KAPPA/LAMBDA LIGHT CHAINS
Kappa free light chain: 25.6 mg/L — ABNORMAL HIGH (ref 3.3–19.4)
Kappa, lambda light chain ratio: 0.7 (ref 0.26–1.65)
Lambda free light chains: 36.4 mg/L — ABNORMAL HIGH (ref 5.7–26.3)

## 2019-07-19 ENCOUNTER — Inpatient Hospital Stay (HOSPITAL_COMMUNITY): Payer: Medicare PPO | Attending: Hematology

## 2019-07-19 ENCOUNTER — Other Ambulatory Visit: Payer: Self-pay

## 2019-07-19 ENCOUNTER — Inpatient Hospital Stay (HOSPITAL_COMMUNITY): Payer: Medicare PPO

## 2019-07-19 VITALS — BP 98/40 | HR 69 | Temp 97.9°F | Resp 18 | Wt 124.2 lb

## 2019-07-19 DIAGNOSIS — Z5111 Encounter for antineoplastic chemotherapy: Secondary | ICD-10-CM | POA: Insufficient documentation

## 2019-07-19 DIAGNOSIS — F0390 Unspecified dementia without behavioral disturbance: Secondary | ICD-10-CM | POA: Diagnosis not present

## 2019-07-19 DIAGNOSIS — C884 Extranodal marginal zone B-cell lymphoma of mucosa-associated lymphoid tissue [MALT-lymphoma]: Secondary | ICD-10-CM | POA: Insufficient documentation

## 2019-07-19 DIAGNOSIS — C9000 Multiple myeloma not having achieved remission: Secondary | ICD-10-CM | POA: Insufficient documentation

## 2019-07-19 DIAGNOSIS — Z923 Personal history of irradiation: Secondary | ICD-10-CM | POA: Insufficient documentation

## 2019-07-19 DIAGNOSIS — Z8572 Personal history of non-Hodgkin lymphomas: Secondary | ICD-10-CM | POA: Diagnosis not present

## 2019-07-19 LAB — COMPREHENSIVE METABOLIC PANEL
ALT: 21 U/L (ref 0–44)
AST: 32 U/L (ref 15–41)
Albumin: 3.3 g/dL — ABNORMAL LOW (ref 3.5–5.0)
Alkaline Phosphatase: 40 U/L (ref 38–126)
Anion gap: 9 (ref 5–15)
BUN: 26 mg/dL — ABNORMAL HIGH (ref 8–23)
CO2: 25 mmol/L (ref 22–32)
Calcium: 9 mg/dL (ref 8.9–10.3)
Chloride: 106 mmol/L (ref 98–111)
Creatinine, Ser: 1.35 mg/dL — ABNORMAL HIGH (ref 0.44–1.00)
GFR calc Af Amer: 45 mL/min — ABNORMAL LOW (ref >60)
GFR calc non Af Amer: 39 mL/min — ABNORMAL LOW (ref >60)
Glucose, Bld: 114 mg/dL — ABNORMAL HIGH (ref 70–99)
Potassium: 4.1 mmol/L (ref 3.5–5.1)
Sodium: 140 mmol/L (ref 135–145)
Total Bilirubin: 0.7 mg/dL (ref 0.3–1.2)
Total Protein: 6.9 g/dL (ref 6.5–8.1)

## 2019-07-19 LAB — CBC WITH DIFFERENTIAL/PLATELET
Abs Immature Granulocytes: 0.01 10*3/uL (ref 0.00–0.07)
Basophils Absolute: 0 10*3/uL (ref 0.0–0.1)
Basophils Relative: 0 %
Eosinophils Absolute: 0.1 10*3/uL (ref 0.0–0.5)
Eosinophils Relative: 3 %
HCT: 32.7 % — ABNORMAL LOW (ref 36.0–46.0)
Hemoglobin: 10.2 g/dL — ABNORMAL LOW (ref 12.0–15.0)
Immature Granulocytes: 0 %
Lymphocytes Relative: 13 %
Lymphs Abs: 0.6 10*3/uL — ABNORMAL LOW (ref 0.7–4.0)
MCH: 30.5 pg (ref 26.0–34.0)
MCHC: 31.2 g/dL (ref 30.0–36.0)
MCV: 97.9 fL (ref 80.0–100.0)
Monocytes Absolute: 0.3 10*3/uL (ref 0.1–1.0)
Monocytes Relative: 6 %
Neutro Abs: 3.8 10*3/uL (ref 1.7–7.7)
Neutrophils Relative %: 78 %
Platelets: 95 10*3/uL — ABNORMAL LOW (ref 150–400)
RBC: 3.34 MIL/uL — ABNORMAL LOW (ref 3.87–5.11)
RDW: 19.3 % — ABNORMAL HIGH (ref 11.5–15.5)
WBC: 4.9 10*3/uL (ref 4.0–10.5)
nRBC: 0 % (ref 0.0–0.2)

## 2019-07-19 MED ORDER — PROCHLORPERAZINE MALEATE 10 MG PO TABS: 10.0000 mg | ORAL_TABLET | Freq: Once | ORAL | Status: DC

## 2019-07-19 MED ORDER — DEXAMETHASONE 4 MG PO TABS
10.0000 mg | ORAL_TABLET | Freq: Once | ORAL | Status: AC
  Administered 2019-07-19: 10 mg via ORAL
  Filled 2019-07-19: qty 3

## 2019-07-19 MED ORDER — BORTEZOMIB CHEMO SQ INJECTION 3.5 MG (2.5MG/ML)
1.3000 mg/m2 | Freq: Once | INTRAMUSCULAR | Status: AC
  Administered 2019-07-19: 2.25 mg via SUBCUTANEOUS
  Filled 2019-07-19: qty 0.9

## 2019-07-19 NOTE — Progress Notes (Signed)
Amanda Wu presents today for day 15 Velcade. VSS. Tolerated injection without incident or complaint. Discharged self ambulatory in satisfactory condition.

## 2019-07-19 NOTE — Patient Instructions (Signed)
[facility] Discharge Instructions for Patients Receiving Chemotherapy   Beginning January 23rd 2017 lab work for the Cancer Center will be done in the  Main lab at [place] on 1st floor. If you have a lab appointment with the Cancer Center please come in thru the  Main Entrance and check in at the main information desk   Today you received the following chemotherapy agents Velcade  To help prevent nausea and vomiting after your treatment, we encourage you to take your nausea medication    If you develop nausea and vomiting, or diarrhea that is not controlled by your medication, call the clinic.  The clinic phone number is [phone]. Office hours are Monday-Friday 8:30am-5:00pm.  BELOW ARE SYMPTOMS THAT SHOULD BE REPORTED IMMEDIATELY:  *FEVER GREATER THAN 101.0 F  *CHILLS WITH OR WITHOUT FEVER  NAUSEA AND VOMITING THAT IS NOT CONTROLLED WITH YOUR NAUSEA MEDICATION  *UNUSUAL SHORTNESS OF BREATH  *UNUSUAL BRUISING OR BLEEDING  TENDERNESS IN MOUTH AND THROAT WITH OR WITHOUT PRESENCE OF ULCERS  *URINARY PROBLEMS  *BOWEL PROBLEMS  UNUSUAL RASH Items with * indicate a potential emergency and should be followed up as soon as possible. If you have an emergency after office hours please contact your primary care physician or go to the nearest emergency department.  Please call the clinic during office hours if you have any questions or concerns.   You may also contact the Patient Navigator at [phone] should you have any questions or need assistance in obtaining follow up care.      Resources For Cancer Patients and their Caregivers ? American Cancer Society: Can assist with transportation, wigs, general needs, runs Look Good Feel Better.        1-888-227-6333 ? Cancer Care: Provides financial assistance, online support groups, medication/co-pay assistance.  1-800-813-HOPE (4673) ? [facility] Assists [place] cancer  patients and their families through emotional , educational and financial support.  336-427-4357 ? [place] DSS Where to apply for food stamps, Medicaid and utility assistance. 336-342-1394 ? [facility]: Transportation to medical appointments. 336-347-2287 ? Social Security Administration: May apply for disability if have a Stage IV cancer. 336-342-7796 1-800-772-1213 ? [place] Aging, Disability and Transit Services: Assists with nutrition, care and transit needs. 336-349-2343          

## 2019-07-20 ENCOUNTER — Encounter (HOSPITAL_COMMUNITY): Payer: Self-pay

## 2019-08-02 ENCOUNTER — Other Ambulatory Visit: Payer: Self-pay

## 2019-08-02 ENCOUNTER — Inpatient Hospital Stay (HOSPITAL_COMMUNITY): Payer: Medicare PPO

## 2019-08-02 ENCOUNTER — Inpatient Hospital Stay (HOSPITAL_COMMUNITY): Payer: Medicare PPO | Admitting: Hematology

## 2019-08-02 ENCOUNTER — Encounter (HOSPITAL_COMMUNITY): Payer: Self-pay | Admitting: Hematology

## 2019-08-02 DIAGNOSIS — Z923 Personal history of irradiation: Secondary | ICD-10-CM | POA: Diagnosis not present

## 2019-08-02 DIAGNOSIS — F0390 Unspecified dementia without behavioral disturbance: Secondary | ICD-10-CM | POA: Diagnosis not present

## 2019-08-02 DIAGNOSIS — Z5111 Encounter for antineoplastic chemotherapy: Secondary | ICD-10-CM | POA: Diagnosis not present

## 2019-08-02 DIAGNOSIS — Z8572 Personal history of non-Hodgkin lymphomas: Secondary | ICD-10-CM | POA: Diagnosis not present

## 2019-08-02 DIAGNOSIS — C9000 Multiple myeloma not having achieved remission: Secondary | ICD-10-CM

## 2019-08-02 DIAGNOSIS — C884 Extranodal marginal zone B-cell lymphoma of mucosa-associated lymphoid tissue [MALT-lymphoma]: Secondary | ICD-10-CM | POA: Diagnosis not present

## 2019-08-02 LAB — COMPREHENSIVE METABOLIC PANEL
ALT: 20 U/L (ref 0–44)
AST: 31 U/L (ref 15–41)
Albumin: 3.2 g/dL — ABNORMAL LOW (ref 3.5–5.0)
Alkaline Phosphatase: 49 U/L (ref 38–126)
Anion gap: 8 (ref 5–15)
BUN: 19 mg/dL (ref 8–23)
CO2: 25 mmol/L (ref 22–32)
Calcium: 8.8 mg/dL — ABNORMAL LOW (ref 8.9–10.3)
Chloride: 106 mmol/L (ref 98–111)
Creatinine, Ser: 1.21 mg/dL — ABNORMAL HIGH (ref 0.44–1.00)
GFR calc Af Amer: 51 mL/min — ABNORMAL LOW (ref >60)
GFR calc non Af Amer: 44 mL/min — ABNORMAL LOW (ref >60)
Glucose, Bld: 116 mg/dL — ABNORMAL HIGH (ref 70–99)
Potassium: 3.5 mmol/L (ref 3.5–5.1)
Sodium: 139 mmol/L (ref 135–145)
Total Bilirubin: 0.6 mg/dL (ref 0.3–1.2)
Total Protein: 7.2 g/dL (ref 6.5–8.1)

## 2019-08-02 LAB — CBC WITH DIFFERENTIAL/PLATELET
Abs Immature Granulocytes: 0.01 10*3/uL (ref 0.00–0.07)
Basophils Absolute: 0 10*3/uL (ref 0.0–0.1)
Basophils Relative: 1 %
Eosinophils Absolute: 0.3 10*3/uL (ref 0.0–0.5)
Eosinophils Relative: 6 %
HCT: 31.4 % — ABNORMAL LOW (ref 36.0–46.0)
Hemoglobin: 10 g/dL — ABNORMAL LOW (ref 12.0–15.0)
Immature Granulocytes: 0 %
Lymphocytes Relative: 16 %
Lymphs Abs: 0.7 10*3/uL (ref 0.7–4.0)
MCH: 30.5 pg (ref 26.0–34.0)
MCHC: 31.8 g/dL (ref 30.0–36.0)
MCV: 95.7 fL (ref 80.0–100.0)
Monocytes Absolute: 0.5 10*3/uL (ref 0.1–1.0)
Monocytes Relative: 11 %
Neutro Abs: 2.8 10*3/uL (ref 1.7–7.7)
Neutrophils Relative %: 66 %
Platelets: 134 10*3/uL — ABNORMAL LOW (ref 150–400)
RBC: 3.28 MIL/uL — ABNORMAL LOW (ref 3.87–5.11)
RDW: 18.6 % — ABNORMAL HIGH (ref 11.5–15.5)
WBC: 4.3 10*3/uL (ref 4.0–10.5)
nRBC: 0 % (ref 0.0–0.2)

## 2019-08-02 LAB — LACTATE DEHYDROGENASE: LDH: 163 U/L (ref 98–192)

## 2019-08-02 MED ORDER — BORTEZOMIB CHEMO SQ INJECTION 3.5 MG (2.5MG/ML)
1.3000 mg/m2 | Freq: Once | INTRAMUSCULAR | Status: AC
  Administered 2019-08-02: 2.25 mg via SUBCUTANEOUS
  Filled 2019-08-02: qty 0.9

## 2019-08-02 MED ORDER — PROCHLORPERAZINE MALEATE 10 MG PO TABS: 10.0000 mg | ORAL_TABLET | Freq: Once | ORAL | Status: DC

## 2019-08-02 MED ORDER — DEXAMETHASONE 4 MG PO TABS
10.0000 mg | ORAL_TABLET | Freq: Once | ORAL | Status: AC
  Administered 2019-08-02: 10 mg via ORAL
  Filled 2019-08-02: qty 3

## 2019-08-02 NOTE — Progress Notes (Signed)
Patient tolerated injection with no complaints voiced.  Site clean and dry with no bruising or swelling noted at site.  Band aid applied.  Vss with discharge and left ambulatory with no s/s of distress noted.  

## 2019-08-02 NOTE — Assessment & Plan Note (Addendum)
1.  IgG lambda multiple myeloma: - Revlimid 25 mg and dexamethasone 3 weeks on 1 week off from September 2009 through November 2017. - Velcade 1 mg per metered squared 3 weeks on 1 week off started on 06/01/2018, dose increased to 1.3 mg/M squared on 12/16/2018. -Revlimid 10 mg 2 weeks on 2 weeks off started on 08/14/2018 for better response.  She also takes dexamethasone 10 mg on days of Velcade. - She is continuing to tolerate Velcade very well without any problems.  No signs of peripheral neuropathy. - We reviewed myeloma panel from 07/12/2019 M spike of 1.1 g.  Kappa light chains at 25.6, lambda light chains are 36.4 with ratio of 0.7. -Her M spike is continuing to improve.  We have reviewed her regular labs.  She will proceed with her Velcade and dexamethasone today. -I will see her back in 5 weeks with repeat myeloma labs.  2.  Bone strengthening: - She received Zometa monthly starting September 2009, has been off of it for several years.  3.  CNS MALT like lymphoma: - Treated with chemotherapy and radiation, last brain MRI in 2013, CT of the brain in 2018 with no evidence of recurrence. -She continues Aricept 10 mg daily for dementia.  4.  ID prophylaxis: -We will continue acyclovir 400 mg twice daily.

## 2019-08-02 NOTE — Patient Instructions (Signed)
[place] at [facility] Discharge Instructions  You were seen today by Dr. Delton Coombes. He went over your recent lab results. Continue Velcade injections. He will see you back in 5 weeks for labs and follow up.   Thank you for choosing [place] at [facility] to provide your oncology and hematology care.  To afford each patient quality time with our provider, please arrive at least 15 minutes before your scheduled appointment time.   If you have a lab appointment with the [facility] please come in thru the  Main Entrance and check in at the main information desk  You need to re-schedule your appointment should you arrive 10 or more minutes late.  We strive to give you quality time with our providers, and arriving late affects you and other patients whose appointments are after yours.  Also, if you no show three or more times for appointments you may be dismissed from the clinic at the providers discretion.     Again, thank you for choosing [facility].  Our hope is that these requests will decrease the amount of time that you wait before being seen by our physicians.       _____________________________________________________________  Should you have questions after your visit to [facility], please contact our office at [phone] between the hours of 8:00 a.m. and 4:30 p.m.  Voicemails left after 4:00 p.m. will not be returned until the following business day.  For prescription refill requests, have your pharmacy contact our office and allow 72 hours.    Cancer Center Support Programs:   > Cancer Support Group  2nd Tuesday of the month 1pm-2pm, Journey Room

## 2019-08-02 NOTE — Progress Notes (Addendum)
[place], [place] [address]   CLINIC:  Medical Oncology/Hematology  PCP:  Monico Blitz, MD [place] [address] [phone]   REASON FOR VISIT:  Follow-up for Multiple Myeloma   CURRENT THERAPY: Rev/Velcade/Dex  BRIEF ONCOLOGIC HISTORY:  Oncology History  Multiple myeloma ([place])  07/09/2017 Initial Diagnosis   Multiple myeloma ([place])   06/01/2018 -  Chemotherapy   The patient had bortezomib SQ (VELCADE) chemo injection 1.75 mg, 1 mg/m2 = 1.75 mg (100 % of original dose 1 mg/m2), Subcutaneous,  Once, 16 of 21 cycles Dose modification: 1 mg/m2 (original dose 1 mg/m2, Cycle 1, Reason: Provider Judgment), 1.3 mg/m2 (original dose 1 mg/m2, Cycle 8, Reason: Provider Judgment) Administration: 1.75 mg (06/01/2018), 1.75 mg (06/08/2018), 1.75 mg (06/15/2018), 1.75 mg (06/29/2018), 1.75 mg (07/06/2018), 1.75 mg (07/13/2018), 1.75 mg (07/27/2018), 1.75 mg (08/03/2018), 1.75 mg (08/10/2018), 1.75 mg (08/24/2018), 1.75 mg (08/31/2018), 1.75 mg (09/07/2018), 1.75 mg (09/21/2018), 1.75 mg (09/28/2018), 1.75 mg (10/05/2018), 1.75 mg (10/19/2018), 1.75 mg (10/26/2018), 1.75 mg (11/02/2018), 1.75 mg (11/16/2018), 1.75 mg (11/23/2018), 1.75 mg (11/30/2018), 2.25 mg (12/17/2018), 2.25 mg (12/24/2018), 2.25 mg (01/04/2019), 2.25 mg (01/18/2019), 2.25 mg (01/25/2019), 2.25 mg (02/01/2019), 2.25 mg (02/15/2019), 2.25 mg (02/22/2019), 2.25 mg (03/01/2019), 2.25 mg (03/15/2019), 2.25 mg (03/23/2019), 2.25 mg (03/31/2019), 2.25 mg (04/12/2019), 2.25 mg (04/19/2019), 2.25 mg (04/26/2019), 2.25 mg (05/10/2019), 2.25 mg (05/17/2019), 2.25 mg (05/24/2019), 2.25 mg (06/07/2019), 2.25 mg (06/14/2019), 2.25 mg (06/21/2019), 2.25 mg (07/05/2019), 2.25 mg (07/12/2019), 2.25 mg (07/19/2019)  for chemotherapy treatment.        INTERVAL HISTORY:  Ms. Dudziak 73 y.o. female seen for follow-up of multiple myeloma.  Denies any tingling or numbness in extremities.  Tolerating Velcade very well.  Denies any nausea, vomiting,  diarrhea or constipation.  Appetite and energy levels are 100%.  Denies any fevers or infections.  She has been off of Revlimid this week.  She also takes dexamethasone 10 mg on days of Velcade.  She is also taking acyclovir twice daily.  Denies any ER visits or hospitalizations.  REVIEW OF SYSTEMS:  Review of Systems  All other systems reviewed and are negative.    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[facility])    Diabetes mellitus without complication ([facility])    H/O partial nephrectomy    Hypokalemia    Hypothyroidism    Impaired cognition    Multiple myeloma (HCC)    Dr Maylon Peppers, [facility]   Thyroid disease    Past Surgical History:  Procedure Laterality Date   ABDOMINAL HYSTERECTOMY     CRANIOTOMY     for lymphoma   YAG LASER APPLICATION Right 7/98/9211   Procedure: YAG LASER APPLICATION;  Surgeon: [name], MD;  Location: [facility];  Service: Ophthalmology;  Laterality: Right;     SOCIAL HISTORY:  Social History   Socioeconomic History   Marital status: Single    Spouse name: Not on file   Number of children: Not on file   Years of education: Not on file   Highest education level: Not on file  Occupational History   Not on file  Social Needs   Financial resource strain: Not on file   Food insecurity    Worry: Not on file    Inability: Not on file   Transportation needs    Medical: Not on file  Non-medical: Not on file  Tobacco Use   Smoking status: Never Smoker   Smokeless tobacco: Never Used  Substance and Sexual Activity   Alcohol use: No   Drug use: No   Sexual activity: Not on file  Lifestyle   Physical activity    Days per week: Not on file    Minutes per session: Not on file   Stress: Not on file  Relationships   Social connections    Talks on phone: Not on file    Gets together: Not on file    Attends religious service: Not on file    Active  member of club or organization: Not on file    Attends meetings of clubs or organizations: Not on file    Relationship status: Not on file   Intimate partner violence    Fear of current or ex partner: Not on file    Emotionally abused: Not on file    Physically abused: Not on file    Forced sexual activity: Not on file  Other Topics Concern   Not on file  Social History Narrative   Not on file    FAMILY HISTORY:  Family History  Problem Relation Age of Onset   Depression Mother    Diabetes Mother    Heart disease Father    Cancer - Prostate Brother    Cancer Brother    Cancer Sister     CURRENT MEDICATIONS:  Outpatient Encounter Medications as of 08/02/2019  Medication Sig Note   ACCU-CHEK SMARTVIEW test strip     acetaminophen (TYLENOL) 500 MG tablet Take 1,000 mg by mouth every 6 (six) hours as needed for mild pain.     acyclovir (ZOVIRAX) 400 MG tablet TAKE ONE TABLET BY MOUTH TWICE DAILY    amLODipine (NORVASC) 5 MG tablet Take 5 mg by mouth daily.     aspirin EC 81 MG tablet Take 81 mg by mouth daily.    bortezomib IV (VELCADE) 3.5 MG injection Inject into the vein.    calcium-vitamin D (OSCAL WITH D) 500-200 MG-UNIT tablet Take 1 tablet by mouth daily with breakfast.    dexamethasone (DECADRON) 4 MG tablet TAKE TWO AND ONE-HALF TABLETS BY MOUTH WEEKLY FOR THREE WEEKS (THREE WEEKS ON, ONE WEEK OFF) IN A ROW AND REPEAT MONTHLY 06/22/2018: Patient states that she is given the dexamethasone while here getting her velcade injection   donepezil (ARICEPT) 10 MG tablet TAKE 1 TABLET BY MOUTH every evening    ferrous sulfate 325 (65 FE) MG tablet Take 325 mg by mouth daily with breakfast.    lenalidomide (REVLIMID) 10 MG capsule Take 1 tablet (10 mg) daily by mouth for 14 days then stop for 14 days.    levothyroxine (SYNTHROID, LEVOTHROID) 25 MCG tablet Take 25 mcg by mouth daily before breakfast.    losartan (COZAAR) 100 MG tablet Take 100 mg by mouth daily.    Multiple  Vitamins-Minerals (MULTIVITAMIN WOMEN 50+ PO) Take by mouth.    potassium chloride (K-DUR) 10 MEQ tablet Take 3 tablets (30 mEq total) by mouth daily.    Facility-Administered Encounter Medications as of 08/02/2019  Medication   prochlorperazine (COMPAZINE) tablet 10 mg    ALLERGIES:  Allergies  Allergen Reactions   Lotensin [Benazepril Hcl]     Facial swelling (Angioedema) Note: ARBs should also be contraindicated, but she is on Losartan!     PHYSICAL EXAM:  ECOG Performance status: 1  Vitals:   08/02/19 1021  BP: (!) 111/53  Pulse: (!) 57  Resp: 18  Temp: 97.9 F (36.6 C)  SpO2: 95%   Filed Weights   08/02/19 1021  Weight: 124 lb 4.8 oz (56.4 kg)    Physical Exam Vitals signs reviewed.  Constitutional:      Appearance: Normal appearance.  HENT:     Head: Normocephalic.     Nose: Nose normal.     Mouth/Throat:     Mouth: Mucous membranes are moist.     Pharynx: Oropharynx is clear.  Eyes:     Extraocular Movements: Extraocular movements intact.     Conjunctiva/sclera: Conjunctivae normal.  Neck:     Musculoskeletal: Normal range of motion.  Cardiovascular:     Rate and Rhythm: Normal rate and regular rhythm.     Pulses: Normal pulses.     Heart sounds: Normal heart sounds.  Pulmonary:     Effort: Pulmonary effort is normal.     Breath sounds: Normal breath sounds.  Abdominal:     General: Bowel sounds are normal.     Palpations: Abdomen is soft.  Musculoskeletal: Normal range of motion.  Skin:    General: Skin is warm and dry.  Neurological:     Mental Status: She is alert. Mental status is at baseline.  Psychiatric:        Mood and Affect: Mood normal.        Behavior: Behavior normal.      LABORATORY DATA:  I have reviewed the labs as listed.  CBC    Component Value Date/Time   WBC 4.3 08/02/2019 0930   RBC 3.28 (L) 08/02/2019 0930   HGB 10.0 (L) 08/02/2019 0930   HCT 31.4 (L) 08/02/2019 0930   PLT 134 (L) 08/02/2019 0930   MCV 95.7  08/02/2019 0930   MCH 30.5 08/02/2019 0930   MCHC 31.8 08/02/2019 0930   RDW 18.6 (H) 08/02/2019 0930   LYMPHSABS 0.7 08/02/2019 0930   MONOABS 0.5 08/02/2019 0930   EOSABS 0.3 08/02/2019 0930   BASOSABS 0.0 08/02/2019 0930   CMP Latest Ref Rng & Units 08/02/2019 07/19/2019 07/12/2019  Glucose 70 - 99 mg/dL 116(H) 114(H) 115(H)  BUN 8 - 23 mg/dL 19 26(H) 22  Creatinine 0.44 - 1.00 mg/dL 1.21(H) 1.35(H) 1.21(H)  Sodium 135 - 145 mmol/L 139 140 141  Potassium 3.5 - 5.1 mmol/L 3.5 4.1 3.8  Chloride 98 - 111 mmol/L 106 106 107  CO2 22 - 32 mmol/L _0 Calcium 8.9 - 10.3 mg/dL 8.8(L) 9.0 9.0  Total Protein 6.5 - 8.1 g/dL 7.2 6.9 6.9  Total Bilirubin 0.3 - 1.2 mg/dL 0.6 0.7 0.5  Alkaline Phos 38 - 126 U/L 49 40 43  AST 15 - 41 U/L 31 32 32  ALT 0 - 44 U/L _1 I have independently reviewed scans and discussed with the patient.     ASSESSMENT & PLAN:   Multiple myeloma (HCC) 1.  IgG lambda multiple myeloma:  Revlimid 25 mg and dexamethasone 3 weeks on 1 week off from September 2009 through November 2017.  Velcade 1 mg per metered squared 3 weeks on 1 week off started on 06/01/2018, dose increased to 1.3 mg/M squared on 12/16/2018. Revlimid 10 mg 2 weeks on 2 weeks off started on 08/14/2018 for better response.  She also takes dexamethasone 10 mg on days of Velcade.  She is continuing to tolerate Velcade very well without any problems.  No signs of peripheral neuropathy.  We reviewed  myeloma panel from 07/12/2019 M spike of 1.1 g.  Kappa light chains at 25.6, lambda light chains are 36.4 with ratio of 0.7. Her M spike is continuing to improve.  We have reviewed her regular labs.  She will proceed with her Velcade and dexamethasone today. I will see her back in 5 weeks with repeat myeloma labs.  2.  Bone strengthening:  She received Zometa monthly starting September 2009, has been off of it for several years.  3.  CNS MALT like lymphoma:  Treated with chemotherapy and  radiation, last brain MRI in 2013, CT of the brain in 2018 with no evidence of recurrence. She continues Aricept 10 mg daily for dementia.  4.  ID prophylaxis: We will continue acyclovir 400 mg twice daily.    Total time spent is 25 minutes with more than 50% of the time spent face-to-face discussing treatment plan and coordination of care.    Orders placed this encounter:  No orders of the defined types were placed in this encounter.     Derek Jack, MD [place] [phone]

## 2019-08-03 LAB — KAPPA/LAMBDA LIGHT CHAINS
Kappa free light chain: 42.7 mg/L — ABNORMAL HIGH (ref 3.3–19.4)
Kappa, lambda light chain ratio: 0.74 (ref 0.26–1.65)
Lambda free light chains: 57.6 mg/L — ABNORMAL HIGH (ref 5.7–26.3)

## 2019-08-03 LAB — PROTEIN ELECTROPHORESIS, SERUM
A/G Ratio: 0.8 (ref 0.7–1.7)
Albumin ELP: 3 g/dL (ref 2.9–4.4)
Alpha-1-Globulin: 0.2 g/dL (ref 0.0–0.4)
Alpha-2-Globulin: 0.9 g/dL (ref 0.4–1.0)
Beta Globulin: 0.7 g/dL (ref 0.7–1.3)
Gamma Globulin: 1.8 g/dL (ref 0.4–1.8)
Globulin, Total: 3.6 g/dL (ref 2.2–3.9)
M-Spike, %: 1.1 g/dL — ABNORMAL HIGH
Total Protein ELP: 6.6 g/dL (ref 6.0–8.5)

## 2019-08-03 MED ORDER — LANREOTIDE ACETATE 120 MG/0.5ML ~~LOC~~ SOLN
SUBCUTANEOUS | Status: AC
  Filled 2019-08-03: qty 120

## 2019-08-08 ENCOUNTER — Other Ambulatory Visit (HOSPITAL_COMMUNITY): Payer: Self-pay | Admitting: *Deleted

## 2019-08-08 DIAGNOSIS — C9000 Multiple myeloma not having achieved remission: Secondary | ICD-10-CM

## 2019-08-08 MED ORDER — LENALIDOMIDE 10 MG PO CAPS: ORAL_CAPSULE | ORAL | 0 refills | Status: DC

## 2019-08-09 ENCOUNTER — Other Ambulatory Visit: Payer: Self-pay

## 2019-08-09 ENCOUNTER — Inpatient Hospital Stay (HOSPITAL_COMMUNITY): Payer: Medicare PPO

## 2019-08-09 VITALS — BP 102/59 | HR 65 | Temp 97.7°F | Resp 18 | Wt 124.0 lb

## 2019-08-09 DIAGNOSIS — Z5111 Encounter for antineoplastic chemotherapy: Secondary | ICD-10-CM | POA: Diagnosis not present

## 2019-08-09 DIAGNOSIS — Z8572 Personal history of non-Hodgkin lymphomas: Secondary | ICD-10-CM | POA: Diagnosis not present

## 2019-08-09 DIAGNOSIS — C9000 Multiple myeloma not having achieved remission: Secondary | ICD-10-CM

## 2019-08-09 DIAGNOSIS — C884 Extranodal marginal zone B-cell lymphoma of mucosa-associated lymphoid tissue [MALT-lymphoma]: Secondary | ICD-10-CM | POA: Diagnosis not present

## 2019-08-09 DIAGNOSIS — F0390 Unspecified dementia without behavioral disturbance: Secondary | ICD-10-CM | POA: Diagnosis not present

## 2019-08-09 DIAGNOSIS — Z923 Personal history of irradiation: Secondary | ICD-10-CM | POA: Diagnosis not present

## 2019-08-09 LAB — CBC WITH DIFFERENTIAL/PLATELET
Abs Immature Granulocytes: 0 10*3/uL (ref 0.00–0.07)
Basophils Absolute: 0 10*3/uL (ref 0.0–0.1)
Basophils Relative: 1 %
Eosinophils Absolute: 0.1 10*3/uL (ref 0.0–0.5)
Eosinophils Relative: 4 %
HCT: 31.9 % — ABNORMAL LOW (ref 36.0–46.0)
Hemoglobin: 10.1 g/dL — ABNORMAL LOW (ref 12.0–15.0)
Immature Granulocytes: 0 %
Lymphocytes Relative: 20 %
Lymphs Abs: 0.6 10*3/uL — ABNORMAL LOW (ref 0.7–4.0)
MCH: 30.3 pg (ref 26.0–34.0)
MCHC: 31.7 g/dL (ref 30.0–36.0)
MCV: 95.8 fL (ref 80.0–100.0)
Monocytes Absolute: 0.4 10*3/uL (ref 0.1–1.0)
Monocytes Relative: 14 %
Neutro Abs: 1.9 10*3/uL (ref 1.7–7.7)
Neutrophils Relative %: 61 %
Platelets: 77 10*3/uL — ABNORMAL LOW (ref 150–400)
RBC: 3.33 MIL/uL — ABNORMAL LOW (ref 3.87–5.11)
RDW: 18.4 % — ABNORMAL HIGH (ref 11.5–15.5)
WBC: 3.1 10*3/uL — ABNORMAL LOW (ref 4.0–10.5)
nRBC: 0 % (ref 0.0–0.2)

## 2019-08-09 LAB — COMPREHENSIVE METABOLIC PANEL
ALT: 20 U/L (ref 0–44)
AST: 34 U/L (ref 15–41)
Albumin: 3.1 g/dL — ABNORMAL LOW (ref 3.5–5.0)
Alkaline Phosphatase: 42 U/L (ref 38–126)
Anion gap: 4 — ABNORMAL LOW (ref 5–15)
BUN: 20 mg/dL (ref 8–23)
CO2: 27 mmol/L (ref 22–32)
Calcium: 8.7 mg/dL — ABNORMAL LOW (ref 8.9–10.3)
Chloride: 107 mmol/L (ref 98–111)
Creatinine, Ser: 1.2 mg/dL — ABNORMAL HIGH (ref 0.44–1.00)
GFR calc Af Amer: 52 mL/min — ABNORMAL LOW (ref >60)
GFR calc non Af Amer: 44 mL/min — ABNORMAL LOW (ref >60)
Glucose, Bld: 140 mg/dL — ABNORMAL HIGH (ref 70–99)
Potassium: 3.5 mmol/L (ref 3.5–5.1)
Sodium: 138 mmol/L (ref 135–145)
Total Bilirubin: 0.6 mg/dL (ref 0.3–1.2)
Total Protein: 6.8 g/dL (ref 6.5–8.1)

## 2019-08-09 MED ORDER — BORTEZOMIB CHEMO SQ INJECTION 3.5 MG (2.5MG/ML)
1.3000 mg/m2 | Freq: Once | INTRAMUSCULAR | Status: AC
  Administered 2019-08-09: 2.25 mg via SUBCUTANEOUS
  Filled 2019-08-09: qty 0.9

## 2019-08-09 MED ORDER — PROCHLORPERAZINE MALEATE 10 MG PO TABS: 10.0000 mg | ORAL_TABLET | Freq: Once | ORAL | Status: DC

## 2019-08-09 MED ORDER — DEXAMETHASONE 4 MG PO TABS
10.0000 mg | ORAL_TABLET | Freq: Once | ORAL | Status: AC
  Administered 2019-08-09: 10 mg via ORAL
  Filled 2019-08-09: qty 3

## 2019-08-09 NOTE — Progress Notes (Signed)
Platelets 77 today.  Ok to treat today per Dr. Delton Coombes if platelets are below 30, 000.  No s/s of distress noted. No complaints of bleeding.    Patient tolerated injection with no complaints voiced.  Site clean and dry with no bruising or swelling noted at site.  Band aid applied.  Vss with discharge and left ambulatory with no s/s of distress noted.

## 2019-08-12 DIAGNOSIS — Z299 Encounter for prophylactic measures, unspecified: Secondary | ICD-10-CM | POA: Diagnosis not present

## 2019-08-12 DIAGNOSIS — E1165 Type 2 diabetes mellitus with hyperglycemia: Secondary | ICD-10-CM | POA: Diagnosis not present

## 2019-08-12 DIAGNOSIS — E0590 Thyrotoxicosis, unspecified without thyrotoxic crisis or storm: Secondary | ICD-10-CM | POA: Diagnosis not present

## 2019-08-12 DIAGNOSIS — Z6820 Body mass index (BMI) 20.0-20.9, adult: Secondary | ICD-10-CM | POA: Diagnosis not present

## 2019-08-12 DIAGNOSIS — I10 Essential (primary) hypertension: Secondary | ICD-10-CM | POA: Diagnosis not present

## 2019-08-15 DIAGNOSIS — E0590 Thyrotoxicosis, unspecified without thyrotoxic crisis or storm: Secondary | ICD-10-CM | POA: Diagnosis not present

## 2019-08-16 ENCOUNTER — Inpatient Hospital Stay (HOSPITAL_COMMUNITY): Payer: Medicare PPO | Attending: Hematology

## 2019-08-16 ENCOUNTER — Inpatient Hospital Stay (HOSPITAL_COMMUNITY): Payer: Medicare PPO

## 2019-08-16 ENCOUNTER — Other Ambulatory Visit: Payer: Self-pay

## 2019-08-16 ENCOUNTER — Encounter (HOSPITAL_COMMUNITY): Payer: Self-pay

## 2019-08-16 VITALS — BP 108/70 | HR 64 | Temp 97.0°F | Resp 18

## 2019-08-16 DIAGNOSIS — Z5111 Encounter for antineoplastic chemotherapy: Secondary | ICD-10-CM | POA: Diagnosis not present

## 2019-08-16 DIAGNOSIS — F0390 Unspecified dementia without behavioral disturbance: Secondary | ICD-10-CM | POA: Diagnosis not present

## 2019-08-16 DIAGNOSIS — Z923 Personal history of irradiation: Secondary | ICD-10-CM | POA: Insufficient documentation

## 2019-08-16 DIAGNOSIS — C9000 Multiple myeloma not having achieved remission: Secondary | ICD-10-CM | POA: Insufficient documentation

## 2019-08-16 DIAGNOSIS — Z23 Encounter for immunization: Secondary | ICD-10-CM | POA: Insufficient documentation

## 2019-08-16 DIAGNOSIS — Z8579 Personal history of other malignant neoplasms of lymphoid, hematopoietic and related tissues: Secondary | ICD-10-CM | POA: Diagnosis not present

## 2019-08-16 DIAGNOSIS — Z9221 Personal history of antineoplastic chemotherapy: Secondary | ICD-10-CM | POA: Diagnosis not present

## 2019-08-16 LAB — CBC WITH DIFFERENTIAL/PLATELET
Abs Immature Granulocytes: 0.01 10*3/uL (ref 0.00–0.07)
Basophils Absolute: 0 10*3/uL (ref 0.0–0.1)
Basophils Relative: 1 %
Eosinophils Absolute: 0.1 10*3/uL (ref 0.0–0.5)
Eosinophils Relative: 3 %
HCT: 32.3 % — ABNORMAL LOW (ref 36.0–46.0)
Hemoglobin: 10.3 g/dL — ABNORMAL LOW (ref 12.0–15.0)
Immature Granulocytes: 0 %
Lymphocytes Relative: 14 %
Lymphs Abs: 0.7 10*3/uL (ref 0.7–4.0)
MCH: 30.8 pg (ref 26.0–34.0)
MCHC: 31.9 g/dL (ref 30.0–36.0)
MCV: 96.7 fL (ref 80.0–100.0)
Monocytes Absolute: 0.3 10*3/uL (ref 0.1–1.0)
Monocytes Relative: 7 %
Neutro Abs: 3.5 10*3/uL (ref 1.7–7.7)
Neutrophils Relative %: 75 %
Platelets: 90 10*3/uL — ABNORMAL LOW (ref 150–400)
RBC: 3.34 MIL/uL — ABNORMAL LOW (ref 3.87–5.11)
RDW: 18.6 % — ABNORMAL HIGH (ref 11.5–15.5)
WBC: 4.6 10*3/uL (ref 4.0–10.5)
nRBC: 0 % (ref 0.0–0.2)

## 2019-08-16 LAB — COMPREHENSIVE METABOLIC PANEL
ALT: 21 U/L (ref 0–44)
AST: 34 U/L (ref 15–41)
Albumin: 3.1 g/dL — ABNORMAL LOW (ref 3.5–5.0)
Alkaline Phosphatase: 45 U/L (ref 38–126)
Anion gap: 8 (ref 5–15)
BUN: 23 mg/dL (ref 8–23)
CO2: 26 mmol/L (ref 22–32)
Calcium: 8.7 mg/dL — ABNORMAL LOW (ref 8.9–10.3)
Chloride: 105 mmol/L (ref 98–111)
Creatinine, Ser: 1.26 mg/dL — ABNORMAL HIGH (ref 0.44–1.00)
GFR calc Af Amer: 49 mL/min — ABNORMAL LOW (ref >60)
GFR calc non Af Amer: 42 mL/min — ABNORMAL LOW (ref >60)
Glucose, Bld: 127 mg/dL — ABNORMAL HIGH (ref 70–99)
Potassium: 3.7 mmol/L (ref 3.5–5.1)
Sodium: 139 mmol/L (ref 135–145)
Total Bilirubin: 0.5 mg/dL (ref 0.3–1.2)
Total Protein: 7 g/dL (ref 6.5–8.1)

## 2019-08-16 MED ORDER — BORTEZOMIB CHEMO SQ INJECTION 3.5 MG (2.5MG/ML)
1.3000 mg/m2 | Freq: Once | INTRAMUSCULAR | Status: AC
  Administered 2019-08-16: 2.25 mg via SUBCUTANEOUS
  Filled 2019-08-16: qty 0.9

## 2019-08-16 MED ORDER — DEXAMETHASONE 4 MG PO TABS
10.0000 mg | ORAL_TABLET | Freq: Once | ORAL | Status: AC
  Administered 2019-08-16: 10 mg via ORAL
  Filled 2019-08-16: qty 3

## 2019-08-16 MED ORDER — CYANOCOBALAMIN 1000 MCG/ML IJ SOLN
INTRAMUSCULAR | Status: AC
  Filled 2019-08-16: qty 1

## 2019-08-16 MED ORDER — PROCHLORPERAZINE MALEATE 10 MG PO TABS: 10.0000 mg | ORAL_TABLET | Freq: Once | ORAL | Status: DC

## 2019-08-16 NOTE — Progress Notes (Signed)
Amanda Wu tolerated Velcade injection well without complaints or incident. VSS Labs reviewed prior to administering injection. Pt discharged self ambulatory in satisfactory condition accompanied by her isiter

## 2019-08-16 NOTE — Patient Instructions (Signed)
[facility] Discharge Instructions for Patients Receiving Chemotherapy   Beginning January 23rd 2017 lab work for the Cancer Center will be done in the  Main lab at [facility] on 1st floor. If you have a lab appointment with the Cancer Center please come in thru the  Main Entrance and check in at the main information desk   Today you received the following chemotherapy agents Velcade. Follow-up as scheduled. Call clinic for any questions or concerns  To help prevent nausea and vomiting after your treatment, we encourage you to take your nausea medication   If you develop nausea and vomiting, or diarrhea that is not controlled by your medication, call the clinic.  The clinic phone number is [phone]. Office hours are Monday-Friday 8:30am-5:00pm.  BELOW ARE SYMPTOMS THAT SHOULD BE REPORTED IMMEDIATELY:  *FEVER GREATER THAN 101.0 F  *CHILLS WITH OR WITHOUT FEVER  NAUSEA AND VOMITING THAT IS NOT CONTROLLED WITH YOUR NAUSEA MEDICATION  *UNUSUAL SHORTNESS OF BREATH  *UNUSUAL BRUISING OR BLEEDING  TENDERNESS IN MOUTH AND THROAT WITH OR WITHOUT PRESENCE OF ULCERS  *URINARY PROBLEMS  *BOWEL PROBLEMS  UNUSUAL RASH Items with * indicate a potential emergency and should be followed up as soon as possible. If you have an emergency after office hours please contact your primary care physician or go to the nearest emergency department.  Please call the clinic during office hours if you have any questions or concerns.   You may also contact the Patient Navigator at [phone] should you have any questions or need assistance in obtaining follow up care.      Resources For Cancer Patients and their Caregivers ? American Cancer Society: Can assist with transportation, wigs, general needs, runs Look Good Feel Better.        1-888-227-6333 ? Cancer Care: Provides financial assistance, online support groups, medication/co-pay assistance.  1-800-813-HOPE  (4673) ? [facility] Assists [place] cancer patients and their families through emotional , educational and financial support.  336-427-4357 ? [place] DSS Where to apply for food stamps, Medicaid and utility assistance. 336-342-1394 ? RCATS: Transportation to medical appointments. 336-347-2287 ? Social Security Administration: May apply for disability if have a Stage IV cancer. 336-342-7796 1-800-772-1213 ? [place] Aging, Disability and Transit Services: Assists with nutrition, care and transit needs. 336-349-2343         

## 2019-08-21 ENCOUNTER — Other Ambulatory Visit (HOSPITAL_COMMUNITY): Payer: Self-pay | Admitting: Nurse Practitioner

## 2019-08-30 ENCOUNTER — Other Ambulatory Visit (HOSPITAL_COMMUNITY): Payer: Self-pay | Admitting: *Deleted

## 2019-08-30 ENCOUNTER — Other Ambulatory Visit: Payer: Self-pay

## 2019-08-30 ENCOUNTER — Inpatient Hospital Stay (HOSPITAL_COMMUNITY): Payer: Medicare PPO

## 2019-08-30 ENCOUNTER — Encounter (HOSPITAL_COMMUNITY): Payer: Self-pay

## 2019-08-30 VITALS — BP 97/54 | HR 53 | Temp 97.6°F | Resp 18 | Wt 124.8 lb

## 2019-08-30 DIAGNOSIS — F0390 Unspecified dementia without behavioral disturbance: Secondary | ICD-10-CM | POA: Diagnosis not present

## 2019-08-30 DIAGNOSIS — Z923 Personal history of irradiation: Secondary | ICD-10-CM | POA: Diagnosis not present

## 2019-08-30 DIAGNOSIS — C9000 Multiple myeloma not having achieved remission: Secondary | ICD-10-CM

## 2019-08-30 DIAGNOSIS — Z23 Encounter for immunization: Secondary | ICD-10-CM | POA: Diagnosis not present

## 2019-08-30 DIAGNOSIS — Z9221 Personal history of antineoplastic chemotherapy: Secondary | ICD-10-CM | POA: Diagnosis not present

## 2019-08-30 DIAGNOSIS — Z0000 Encounter for general adult medical examination without abnormal findings: Secondary | ICD-10-CM

## 2019-08-30 DIAGNOSIS — Z5111 Encounter for antineoplastic chemotherapy: Secondary | ICD-10-CM | POA: Diagnosis not present

## 2019-08-30 DIAGNOSIS — Z8579 Personal history of other malignant neoplasms of lymphoid, hematopoietic and related tissues: Secondary | ICD-10-CM | POA: Diagnosis not present

## 2019-08-30 LAB — COMPREHENSIVE METABOLIC PANEL
ALT: 22 U/L (ref 0–44)
AST: 32 U/L (ref 15–41)
Albumin: 3.1 g/dL — ABNORMAL LOW (ref 3.5–5.0)
Alkaline Phosphatase: 45 U/L (ref 38–126)
Anion gap: 7 (ref 5–15)
BUN: 24 mg/dL — ABNORMAL HIGH (ref 8–23)
CO2: 26 mmol/L (ref 22–32)
Calcium: 9.1 mg/dL (ref 8.9–10.3)
Chloride: 108 mmol/L (ref 98–111)
Creatinine, Ser: 1.59 mg/dL — ABNORMAL HIGH (ref 0.44–1.00)
GFR calc Af Amer: 36 mL/min — ABNORMAL LOW (ref >60)
GFR calc non Af Amer: 31 mL/min — ABNORMAL LOW (ref >60)
Glucose, Bld: 115 mg/dL — ABNORMAL HIGH (ref 70–99)
Potassium: 4.3 mmol/L (ref 3.5–5.1)
Sodium: 141 mmol/L (ref 135–145)
Total Bilirubin: 0.8 mg/dL (ref 0.3–1.2)
Total Protein: 6.6 g/dL (ref 6.5–8.1)

## 2019-08-30 LAB — CBC WITH DIFFERENTIAL/PLATELET
Abs Immature Granulocytes: 0.02 10*3/uL (ref 0.00–0.07)
Basophils Absolute: 0 10*3/uL (ref 0.0–0.1)
Basophils Relative: 1 %
Eosinophils Absolute: 0.3 10*3/uL (ref 0.0–0.5)
Eosinophils Relative: 5 %
HCT: 31.1 % — ABNORMAL LOW (ref 36.0–46.0)
Hemoglobin: 9.9 g/dL — ABNORMAL LOW (ref 12.0–15.0)
Immature Granulocytes: 0 %
Lymphocytes Relative: 19 %
Lymphs Abs: 1 10*3/uL (ref 0.7–4.0)
MCH: 30.8 pg (ref 26.0–34.0)
MCHC: 31.8 g/dL (ref 30.0–36.0)
MCV: 96.9 fL (ref 80.0–100.0)
Monocytes Absolute: 0.5 10*3/uL (ref 0.1–1.0)
Monocytes Relative: 10 %
Neutro Abs: 3.3 10*3/uL (ref 1.7–7.7)
Neutrophils Relative %: 65 %
Platelets: 129 10*3/uL — ABNORMAL LOW (ref 150–400)
RBC: 3.21 MIL/uL — ABNORMAL LOW (ref 3.87–5.11)
RDW: 18.5 % — ABNORMAL HIGH (ref 11.5–15.5)
WBC: 5.2 10*3/uL (ref 4.0–10.5)
nRBC: 0 % (ref 0.0–0.2)

## 2019-08-30 MED ORDER — INFLUENZA VAC A&B SA ADJ QUAD 0.5 ML IM PRSY
0.5000 mL | PREFILLED_SYRINGE | Freq: Once | INTRAMUSCULAR | Status: AC
  Administered 2019-08-30: 0.5 mL via INTRAMUSCULAR
  Filled 2019-08-30: qty 0.5

## 2019-08-30 MED ORDER — DEXAMETHASONE 4 MG PO TABS
10.0000 mg | ORAL_TABLET | Freq: Once | ORAL | Status: AC
  Administered 2019-08-30: 10 mg via ORAL
  Filled 2019-08-30: qty 3

## 2019-08-30 MED ORDER — PROCHLORPERAZINE MALEATE 10 MG PO TABS: 10.0000 mg | ORAL_TABLET | Freq: Once | ORAL | Status: DC

## 2019-08-30 MED ORDER — BORTEZOMIB CHEMO SQ INJECTION 3.5 MG (2.5MG/ML)
1.3000 mg/m2 | Freq: Once | INTRAMUSCULAR | Status: AC
  Administered 2019-08-30: 13:00:00 2.25 mg via SUBCUTANEOUS
  Filled 2019-08-30: qty 0.9

## 2019-08-30 MED ORDER — LENALIDOMIDE 10 MG PO CAPS: ORAL_CAPSULE | ORAL | 0 refills | Status: DC

## 2019-08-30 NOTE — Progress Notes (Signed)
Reviewed elevated serum creatinine with Amanda Wu, and ok to give velcade today verbal order.   Patient tolerated injection with no complaints voiced.  Site clean and dry with no bruising or swelling noted at site.  Band aid applied.  Vss with discharge and left ambulatory with no s/s of distress noted.

## 2019-09-06 ENCOUNTER — Inpatient Hospital Stay (HOSPITAL_COMMUNITY): Payer: Medicare PPO

## 2019-09-06 ENCOUNTER — Inpatient Hospital Stay (HOSPITAL_COMMUNITY): Payer: Medicare PPO | Admitting: Hematology

## 2019-09-06 ENCOUNTER — Other Ambulatory Visit: Payer: Self-pay

## 2019-09-06 ENCOUNTER — Other Ambulatory Visit (HOSPITAL_COMMUNITY): Payer: Medicare PPO

## 2019-09-06 ENCOUNTER — Encounter (HOSPITAL_COMMUNITY): Payer: Self-pay | Admitting: Hematology

## 2019-09-06 VITALS — BP 124/67 | HR 59 | Temp 97.2°F | Resp 18 | Wt 123.5 lb

## 2019-09-06 DIAGNOSIS — C9000 Multiple myeloma not having achieved remission: Secondary | ICD-10-CM

## 2019-09-06 DIAGNOSIS — F0390 Unspecified dementia without behavioral disturbance: Secondary | ICD-10-CM | POA: Diagnosis not present

## 2019-09-06 DIAGNOSIS — Z8579 Personal history of other malignant neoplasms of lymphoid, hematopoietic and related tissues: Secondary | ICD-10-CM | POA: Diagnosis not present

## 2019-09-06 DIAGNOSIS — Z923 Personal history of irradiation: Secondary | ICD-10-CM | POA: Diagnosis not present

## 2019-09-06 DIAGNOSIS — Z23 Encounter for immunization: Secondary | ICD-10-CM | POA: Diagnosis not present

## 2019-09-06 DIAGNOSIS — Z5111 Encounter for antineoplastic chemotherapy: Secondary | ICD-10-CM | POA: Diagnosis not present

## 2019-09-06 DIAGNOSIS — Z9221 Personal history of antineoplastic chemotherapy: Secondary | ICD-10-CM | POA: Diagnosis not present

## 2019-09-06 LAB — COMPREHENSIVE METABOLIC PANEL
ALT: 24 U/L (ref 0–44)
AST: 34 U/L (ref 15–41)
Albumin: 3.1 g/dL — ABNORMAL LOW (ref 3.5–5.0)
Alkaline Phosphatase: 43 U/L (ref 38–126)
Anion gap: 6 (ref 5–15)
BUN: 20 mg/dL (ref 8–23)
CO2: 26 mmol/L (ref 22–32)
Calcium: 8.6 mg/dL — ABNORMAL LOW (ref 8.9–10.3)
Chloride: 108 mmol/L (ref 98–111)
Creatinine, Ser: 1.14 mg/dL — ABNORMAL HIGH (ref 0.44–1.00)
GFR calc Af Amer: 54 mL/min — ABNORMAL LOW (ref >60)
GFR calc non Af Amer: 47 mL/min — ABNORMAL LOW (ref >60)
Glucose, Bld: 136 mg/dL — ABNORMAL HIGH (ref 70–99)
Potassium: 3.6 mmol/L (ref 3.5–5.1)
Sodium: 140 mmol/L (ref 135–145)
Total Bilirubin: 0.2 mg/dL — ABNORMAL LOW (ref 0.3–1.2)
Total Protein: 6.8 g/dL (ref 6.5–8.1)

## 2019-09-06 LAB — CBC WITH DIFFERENTIAL/PLATELET
Abs Immature Granulocytes: 0.01 10*3/uL (ref 0.00–0.07)
Basophils Absolute: 0 10*3/uL (ref 0.0–0.1)
Basophils Relative: 0 %
Eosinophils Absolute: 0.1 10*3/uL (ref 0.0–0.5)
Eosinophils Relative: 2 %
HCT: 31.6 % — ABNORMAL LOW (ref 36.0–46.0)
Hemoglobin: 10.1 g/dL — ABNORMAL LOW (ref 12.0–15.0)
Immature Granulocytes: 0 %
Lymphocytes Relative: 16 %
Lymphs Abs: 0.6 10*3/uL — ABNORMAL LOW (ref 0.7–4.0)
MCH: 30.8 pg (ref 26.0–34.0)
MCHC: 32 g/dL (ref 30.0–36.0)
MCV: 96.3 fL (ref 80.0–100.0)
Monocytes Absolute: 0.4 10*3/uL (ref 0.1–1.0)
Monocytes Relative: 13 %
Neutro Abs: 2.4 10*3/uL (ref 1.7–7.7)
Neutrophils Relative %: 69 %
Platelets: 66 10*3/uL — ABNORMAL LOW (ref 150–400)
RBC: 3.28 MIL/uL — ABNORMAL LOW (ref 3.87–5.11)
RDW: 18.2 % — ABNORMAL HIGH (ref 11.5–15.5)
WBC: 3.5 10*3/uL — ABNORMAL LOW (ref 4.0–10.5)
nRBC: 0 % (ref 0.0–0.2)

## 2019-09-06 MED ORDER — PROCHLORPERAZINE MALEATE 10 MG PO TABS: 10.0000 mg | ORAL_TABLET | Freq: Once | ORAL | Status: DC

## 2019-09-06 MED ORDER — ACYCLOVIR 400 MG PO TABS: 400.0000 mg | ORAL_TABLET | Freq: Two times a day (BID) | ORAL | 2 refills | Status: DC

## 2019-09-06 MED ORDER — BORTEZOMIB CHEMO SQ INJECTION 3.5 MG (2.5MG/ML)
1.3000 mg/m2 | Freq: Once | INTRAMUSCULAR | Status: AC
  Administered 2019-09-06: 2.25 mg via SUBCUTANEOUS
  Filled 2019-09-06: qty 0.9

## 2019-09-06 MED ORDER — DEXAMETHASONE 4 MG PO TABS
10.0000 mg | ORAL_TABLET | Freq: Once | ORAL | Status: AC
  Administered 2019-09-06: 10 mg via ORAL
  Filled 2019-09-06: qty 3

## 2019-09-06 NOTE — Assessment & Plan Note (Addendum)
1.  IgG lambda multiple myeloma: - Revlimid 25 mg and dexamethasone 3 weeks on 1 week off from September 2009 through November 2017. - Velcade 1 mg per metered squared 3 weeks on 1 week off started on 06/01/2018, dose increased to 1.3 mg/M squared on 12/16/2018. -Revlimid 10 mg 2 weeks on 2 weeks off started on 08/14/2018 for better response.  She also takes dexamethasone 10 mg on days of Velcade. - She is tolerating Velcade 3 weeks on/1 week off very well.  No signs of peripheral neuropathy. -We reviewed results of myeloma panel from 08/02/2019.  M spike is stable at 1.1 g.  Free light chain ratio is 0.74.  Lambda light chains have increased from 36.4 to 57.6. - She will continue with the same regimen at this time.  I plan to repeat her labs in 4 weeks and see her back in 6 weeks for follow-up.  2.  Bone strengthening: - She received Zometa monthly starting September 2009, has been off of it for several years.  3.  CNS MALT like lymphoma: - Treated with chemotherapy and radiation, last brain MRI in 2013, CT of the brain in 2018 with no evidence of recurrence. -She continues Aricept 10 mg daily for dementia.  4.  ID prophylaxis: -She will continue acyclovir 400 mg twice daily.

## 2019-09-06 NOTE — Progress Notes (Signed)
1125 Labs reviewed with and pt seen by Dr. Delton Coombes and pt approved for Velcade injection today per MD                [facility] tolerated Velcade injection well without complaints or incident. Pt discharged self ambulatory in satisfactory condition accompanied by her sisters

## 2019-09-06 NOTE — Patient Instructions (Signed)
[facility] Discharge Instructions for Patients Receiving Chemotherapy   Beginning January 23rd 2017 lab work for the [facility] will be done in the  Main lab at [facility] on 1st floor. If you have a lab appointment with the [facility] please come in thru the  Main Entrance and check in at the main information desk   Today you received the following chemotherapy agents Velcade injection, Follow-up as scheduled. Call clinic for any questions or concerns  To help prevent nausea and vomiting after your treatment, we encourage you to take your nausea medication   If you develop nausea and vomiting, or diarrhea that is not controlled by your medication, call the clinic.  The clinic phone number is [phone]. Office hours are Monday-Friday 8:30am-5:00pm.  BELOW ARE SYMPTOMS THAT SHOULD BE REPORTED IMMEDIATELY:  *FEVER GREATER THAN 101.0 F  *CHILLS WITH OR WITHOUT FEVER  NAUSEA AND VOMITING THAT IS NOT CONTROLLED WITH YOUR NAUSEA MEDICATION  *UNUSUAL SHORTNESS OF BREATH  *UNUSUAL BRUISING OR BLEEDING  TENDERNESS IN MOUTH AND THROAT WITH OR WITHOUT PRESENCE OF ULCERS  *URINARY PROBLEMS  *BOWEL PROBLEMS  UNUSUAL RASH Items with * indicate a potential emergency and should be followed up as soon as possible. If you have an emergency after office hours please contact your primary care physician or go to the nearest emergency department.  Please call the clinic during office hours if you have any questions or concerns.   You may also contact the Patient Navigator at [phone] should you have any questions or need assistance in obtaining follow up care.      Resources For Cancer Patients and their Caregivers ? American Cancer Society: Can assist with transportation, wigs, general needs, runs Look Good Feel Better.        641-232-3930 ? Cancer Care: Provides financial assistance, online support groups, medication/co-pay assistance.   1-800-813-HOPE 973-738-9901) ? [facility] Assists [place] cancer patients and their families through emotional , educational and financial support.  971-732-0014 ? [place] DSS Where to apply for food stamps, Medicaid and utility assistance. 940-246-2998 ? RCATS: Transportation to medical appointments. (703)042-6965 ? Social Security Administration: May apply for disability if have a Stage IV cancer. ([phone] ? [employer], Disability and Transit Services: Assists with nutrition, care and transit needs. 3658255591

## 2019-09-06 NOTE — Patient Instructions (Addendum)
[facility] at [facility] Discharge Instructions  You were seen today by Dr. Delton Coombes. He went over your recent lab results. He would like you to continue to take your Revlimid as directed as well as your injections. He will see you back in for labs and follow up.   Thank you for choosing [facility] at [facility] to provide your oncology and hematology care.  To afford each patient quality time with our provider, please arrive at least 15 minutes before your scheduled appointment time.   If you have a lab appointment with the [facility] please come in thru the  Main Entrance and check in at the main information desk  You need to re-schedule your appointment should you arrive 10 or more minutes late.  We strive to give you quality time with our providers, and arriving late affects you and other patients whose appointments are after yours.  Also, if you no show three or more times for appointments you may be dismissed from the clinic at the providers discretion.     Again, thank you for choosing [facility].  Our hope is that these requests will decrease the amount of time that you wait before being seen by our physicians.       _____________________________________________________________  Should you have questions after your visit to [facility], please contact our office at [phone] between the hours of 8:00 a.m. and 4:30 p.m.  Voicemails left after 4:00 p.m. will not be returned until the following business day.  For prescription refill requests, have your pharmacy contact our office and allow 72 hours.    Cancer Center Support Programs:   > Cancer Support Group  2nd Tuesday of the month 1pm-2pm, Journey Room

## 2019-09-06 NOTE — Progress Notes (Signed)
[place], [place] [address]   CLINIC:  Medical Oncology/Hematology  PCP:  Monico Blitz, MD [place] [address] [phone]   REASON FOR VISIT:  Follow-up for Multiple Myeloma   CURRENT THERAPY: Rev/Velcade/Dex  BRIEF ONCOLOGIC HISTORY:  Oncology History  Multiple myeloma ([place] [facility])  07/09/2017 Initial Diagnosis   Multiple myeloma ([facility])   06/01/2018 -  Chemotherapy   The patient had bortezomib SQ (VELCADE) chemo injection 1.75 mg, 1 mg/m2 = 1.75 mg (100 % of original dose 1 mg/m2), Subcutaneous,  Once, 17 of 21 cycles Dose modification: 1 mg/m2 (original dose 1 mg/m2, Cycle 1, Reason: Provider Judgment), 1.3 mg/m2 (original dose 1 mg/m2, Cycle 8, Reason: Provider Judgment) Administration: 1.75 mg (06/01/2018), 1.75 mg (06/08/2018), 1.75 mg (06/15/2018), 1.75 mg (06/29/2018), 1.75 mg (07/06/2018), 1.75 mg (07/13/2018), 1.75 mg (07/27/2018), 1.75 mg (08/03/2018), 1.75 mg (08/10/2018), 1.75 mg (08/24/2018), 1.75 mg (08/31/2018), 1.75 mg (09/07/2018), 1.75 mg (09/21/2018), 1.75 mg (09/28/2018), 1.75 mg (10/05/2018), 1.75 mg (10/19/2018), 1.75 mg (10/26/2018), 1.75 mg (11/02/2018), 1.75 mg (11/16/2018), 1.75 mg (11/23/2018), 1.75 mg (11/30/2018), 2.25 mg (12/17/2018), 2.25 mg (12/24/2018), 2.25 mg (01/04/2019), 2.25 mg (01/18/2019), 2.25 mg (01/25/2019), 2.25 mg (02/01/2019), 2.25 mg (02/15/2019), 2.25 mg (02/22/2019), 2.25 mg (03/01/2019), 2.25 mg (03/15/2019), 2.25 mg (03/23/2019), 2.25 mg (03/31/2019), 2.25 mg (04/12/2019), 2.25 mg (04/19/2019), 2.25 mg (04/26/2019), 2.25 mg (05/10/2019), 2.25 mg (05/17/2019), 2.25 mg (05/24/2019), 2.25 mg (06/07/2019), 2.25 mg (06/14/2019), 2.25 mg (06/21/2019), 2.25 mg (07/05/2019), 2.25 mg (07/12/2019), 2.25 mg (07/19/2019), 2.25 mg (08/02/2019), 2.25 mg (08/09/2019), 2.25 mg (08/16/2019), 2.25 mg (08/30/2019)  for chemotherapy treatment.        INTERVAL HISTORY:  [name] 73 y.o. female seen for follow-up of multiple myeloma.  Denies any tingling or  numbness in extremities.  Occasional sleep problems reported.  Appetite and energy levels are 75%.  She is tolerating Revlimid 10 mg 2 weeks on 2 weeks off very well.  She is also tolerating Velcade 3 weeks on 1 week off very well.  Denies any fevers or night sweats.  Denies any bleeding issues.  No change in bowel habits.  No ER visits or hospitalizations.  REVIEW OF SYSTEMS:  Review of Systems  Psychiatric/Behavioral: Positive for sleep disturbance.  All other systems reviewed and are negative.    PAST MEDICAL/SURGICAL HISTORY:  Past Medical History:  Diagnosis Date  . Benign hypertension   . Cataract   . 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 Diabetes mellitus without complication ([facility])   . H/O partial nephrectomy   . Hypokalemia   . Hypothyroidism   . Impaired cognition   . Multiple myeloma ([facility])    Dr Maylon Peppers, [facility]  . Thyroid disease    Past Surgical History:  Procedure Laterality Date  . ABDOMINAL HYSTERECTOMY    . CRANIOTOMY     for lymphoma  . YAG LASER APPLICATION Right 0/63/0160   Procedure: YAG LASER APPLICATION;  Surgeon: [name], MD;  Location: [facility];  Service: Ophthalmology;  Laterality: Right;     SOCIAL HISTORY:  Social History   Socioeconomic History  . Marital status: Single    Spouse name: Not on file  . Number of children: Not on file  . Years of education: Not on file  . Highest education level: Not on file  Occupational History  . Not on file  Social Needs  . Financial resource strain: Not on file  . Food insecurity    Worry: Not  on file    Inability: Not on file  . Transportation needs    Medical: Not on file    Non-medical: Not on file  Tobacco Use  . Smoking status: Never Smoker  . Smokeless tobacco: Never Used  Substance and Sexual Activity  . Alcohol use: No  . Drug use: No  . Sexual activity: Not on file  Lifestyle  . Physical activity    Days per week: Not on file    Minutes per session: Not on file  . Stress:  Not on file  Relationships  . Social Herbalist on phone: Not on file    Gets together: Not on file    Attends religious service: Not on file    Active member of club or organization: Not on file    Attends meetings of clubs or organizations: Not on file    Relationship status: Not on file  . Intimate partner violence    Fear of current or ex partner: Not on file    Emotionally abused: Not on file    Physically abused: Not on file    Forced sexual activity: Not on file  Other Topics Concern  . Not on file  Social History Narrative  . Not on file    FAMILY HISTORY:  Family History  Problem Relation Age of Onset  . Depression Mother   . Diabetes Mother   . Heart disease Father   . Cancer - Prostate Brother   . Cancer Brother   . Cancer Sister     CURRENT MEDICATIONS:  Outpatient Encounter Medications as of 09/06/2019  Medication Sig Note  . ACCU-CHEK SMARTVIEW test strip    . amLODipine (NORVASC) 5 MG tablet Take 5 mg by mouth daily.    Marland Kitchen aspirin EC 81 MG tablet Take 81 mg by mouth daily.   . bortezomib IV (VELCADE) 3.5 MG injection Inject into the vein.   . calcium-vitamin D (OSCAL WITH D) 500-200 MG-UNIT tablet Take 1 tablet by mouth daily with breakfast.   . dexamethasone (DECADRON) 4 MG tablet TAKE TWO AND ONE-HALF TABLETS BY MOUTH WEEKLY FOR THREE WEEKS (THREE WEEKS ON, ONE WEEK OFF) IN A ROW AND REPEAT MONTHLY 06/22/2018: Patient states that she is given the dexamethasone while here getting her velcade injection  . donepezil (ARICEPT) 10 MG tablet TAKE 1 TABLET BY MOUTH every evening   . ferrous sulfate 325 (65 FE) MG tablet Take 325 mg by mouth daily with breakfast.   . lenalidomide (REVLIMID) 10 MG capsule Take 1 tablet (10 mg) daily by mouth for 14 days then stop for 14 days.   Marland Kitchen levothyroxine (SYNTHROID, LEVOTHROID) 25 MCG tablet Take 25 mcg by mouth daily before breakfast.   . losartan (COZAAR) 100 MG tablet Take 100 mg by mouth daily.   . Multiple  Vitamins-Minerals (MULTIVITAMIN WOMEN 50+ PO) Take by mouth.   . potassium chloride (K-DUR) 10 MEQ tablet Take 3 tablets (30 mEq total) by mouth daily.   Marland Kitchen acetaminophen (TYLENOL) 500 MG tablet Take 1,000 mg by mouth every 6 (six) hours as needed for mild pain.    Marland Kitchen acyclovir (ZOVIRAX) 400 MG tablet Take 1 tablet (400 mg total) by mouth 2 (two) times daily.   . [DISCONTINUED] acyclovir (ZOVIRAX) 400 MG tablet TAKE ONE TABLET BY MOUTH TWICE DAILY    Facility-Administered Encounter Medications as of 09/06/2019  Medication  . prochlorperazine (COMPAZINE) tablet 10 mg    ALLERGIES:  Allergies  Allergen Reactions  .  Lotensin [Benazepril Hcl]     Facial swelling (Angioedema) Note: ARBs should also be contraindicated, but she is on Losartan!     PHYSICAL EXAM:  ECOG Performance status: 1  Vitals:   09/06/19 1005  BP: 124/67  Pulse: (!) 59  Resp: 18  Temp: (!) 97.2 F (36.2 C)  SpO2: 99%   Filed Weights   09/06/19 1005  Weight: 123 lb 8 oz (56 kg)    Physical Exam Vitals signs reviewed.  Constitutional:      Appearance: Normal appearance.  HENT:     Head: Normocephalic.     Nose: Nose normal.     Mouth/Throat:     Mouth: Mucous membranes are moist.     Pharynx: Oropharynx is clear.  Eyes:     Extraocular Movements: Extraocular movements intact.     Conjunctiva/sclera: Conjunctivae normal.  Neck:     Musculoskeletal: Normal range of motion.  Cardiovascular:     Rate and Rhythm: Normal rate and regular rhythm.     Pulses: Normal pulses.     Heart sounds: Normal heart sounds.  Pulmonary:     Effort: Pulmonary effort is normal.     Breath sounds: Normal breath sounds.  Abdominal:     General: Bowel sounds are normal.     Palpations: Abdomen is soft.  Musculoskeletal: Normal range of motion.  Skin:    General: Skin is warm and dry.  Neurological:     Mental Status: She is alert. Mental status is at baseline.  Psychiatric:        Mood and Affect: Mood normal.         Behavior: Behavior normal.      LABORATORY DATA:  I have reviewed the labs as listed.  CBC    Component Value Date/Time   WBC 3.5 (L) 09/06/2019 0903   RBC 3.28 (L) 09/06/2019 0903   HGB 10.1 (L) 09/06/2019 0903   HCT 31.6 (L) 09/06/2019 0903   PLT 66 (L) 09/06/2019 0903   MCV 96.3 09/06/2019 0903   MCH 30.8 09/06/2019 0903   MCHC 32.0 09/06/2019 0903   RDW 18.2 (H) 09/06/2019 0903   LYMPHSABS 0.6 (L) 09/06/2019 0903   MONOABS 0.4 09/06/2019 0903   EOSABS 0.1 09/06/2019 0903   BASOSABS 0.0 09/06/2019 0903   CMP Latest Ref Rng & Units 09/06/2019 08/30/2019 08/16/2019  Glucose 70 - 99 mg/dL 136(H) 115(H) 127(H)  BUN 8 - 23 mg/dL 20 24(H) 23  Creatinine 0.44 - 1.00 mg/dL 1.14(H) 1.59(H) 1.26(H)  Sodium 135 - 145 mmol/L 140 141 139  Potassium 3.5 - 5.1 mmol/L 3.6 4.3 3.7  Chloride 98 - 111 mmol/L 108 108 105  CO2 22 - 32 mmol/L _0 Calcium 8.9 - 10.3 mg/dL 8.6(L) 9.1 8.7(L)  Total Protein 6.5 - 8.1 g/dL 6.8 6.6 7.0  Total Bilirubin 0.3 - 1.2 mg/dL 0.2(L) 0.8 0.5  Alkaline Phos 38 - 126 U/L 43 45 45  AST 15 - 41 U/L 34 32 34  ALT 0 - 44 U/L _1 I have independently reviewed scans and discussed with the patient.     ASSESSMENT & PLAN:   Multiple myeloma (HCC) 1.  IgG lambda multiple myeloma: - Revlimid 25 mg and dexamethasone 3 weeks on 1 week off from September 2009 through November 2017. - Velcade 1 mg per metered squared 3 weeks on 1 week off started on 06/01/2018, dose increased to 1.3 mg/M squared on 12/16/2018. -Revlimid 10  mg 2 weeks on 2 weeks off started on 08/14/2018 for better response.  She also takes dexamethasone 10 mg on days of Velcade. - She is tolerating Velcade 3 weeks on/1 week off very well.  No signs of peripheral neuropathy. -We reviewed results of myeloma panel from 08/02/2019.  M spike is stable at 1.1 g.  Free light chain ratio is 0.74.  Lambda light chains have increased from 36.4 to 57.6. - She will continue with the same regimen  at this time.  I plan to repeat her labs in 4 weeks and see her back in 6 weeks for follow-up.  2.  Bone strengthening: - She received Zometa monthly starting September 2009, has been off of it for several years.  3.  CNS MALT like lymphoma: - Treated with chemotherapy and radiation, last brain MRI in 2013, CT of the brain in 2018 with no evidence of recurrence. -She continues Aricept 10 mg daily for dementia.  4.  ID prophylaxis: -She will continue acyclovir 400 mg twice daily.    Total time spent is 25 minutes with more than 50% of the time spent face-to-face discussing treatment plan and coordination of care.    Orders placed this encounter:  Orders Placed This Encounter  Procedures  . CBC with Differential/Platelet  . Comprehensive metabolic panel  . Protein electrophoresis, serum  . Kappa/lambda light chains  . Lactate dehydrogenase      Derek Jack, MD [place] [phone]

## 2019-09-13 ENCOUNTER — Inpatient Hospital Stay (HOSPITAL_COMMUNITY): Payer: Medicare PPO

## 2019-09-13 ENCOUNTER — Other Ambulatory Visit: Payer: Self-pay

## 2019-09-13 VITALS — BP 109/45 | HR 50 | Temp 97.3°F | Resp 18 | Wt 123.4 lb

## 2019-09-13 DIAGNOSIS — Z8579 Personal history of other malignant neoplasms of lymphoid, hematopoietic and related tissues: Secondary | ICD-10-CM | POA: Diagnosis not present

## 2019-09-13 DIAGNOSIS — C9000 Multiple myeloma not having achieved remission: Secondary | ICD-10-CM | POA: Diagnosis not present

## 2019-09-13 DIAGNOSIS — Z5111 Encounter for antineoplastic chemotherapy: Secondary | ICD-10-CM | POA: Diagnosis not present

## 2019-09-13 DIAGNOSIS — Z923 Personal history of irradiation: Secondary | ICD-10-CM | POA: Diagnosis not present

## 2019-09-13 DIAGNOSIS — F0390 Unspecified dementia without behavioral disturbance: Secondary | ICD-10-CM | POA: Diagnosis not present

## 2019-09-13 DIAGNOSIS — Z9221 Personal history of antineoplastic chemotherapy: Secondary | ICD-10-CM | POA: Diagnosis not present

## 2019-09-13 DIAGNOSIS — Z23 Encounter for immunization: Secondary | ICD-10-CM | POA: Diagnosis not present

## 2019-09-13 LAB — COMPREHENSIVE METABOLIC PANEL
ALT: 21 U/L (ref 0–44)
AST: 31 U/L (ref 15–41)
Albumin: 3.1 g/dL — ABNORMAL LOW (ref 3.5–5.0)
Alkaline Phosphatase: 41 U/L (ref 38–126)
Anion gap: 7 (ref 5–15)
BUN: 22 mg/dL (ref 8–23)
CO2: 28 mmol/L (ref 22–32)
Calcium: 8.8 mg/dL — ABNORMAL LOW (ref 8.9–10.3)
Chloride: 104 mmol/L (ref 98–111)
Creatinine, Ser: 1.32 mg/dL — ABNORMAL HIGH (ref 0.44–1.00)
GFR calc Af Amer: 46 mL/min — ABNORMAL LOW (ref >60)
GFR calc non Af Amer: 39 mL/min — ABNORMAL LOW (ref >60)
Glucose, Bld: 101 mg/dL — ABNORMAL HIGH (ref 70–99)
Potassium: 3.8 mmol/L (ref 3.5–5.1)
Sodium: 139 mmol/L (ref 135–145)
Total Bilirubin: 0.5 mg/dL (ref 0.3–1.2)
Total Protein: 6.6 g/dL (ref 6.5–8.1)

## 2019-09-13 LAB — CBC WITH DIFFERENTIAL/PLATELET
Abs Immature Granulocytes: 0.01 10*3/uL (ref 0.00–0.07)
Basophils Absolute: 0 10*3/uL (ref 0.0–0.1)
Basophils Relative: 0 %
Eosinophils Absolute: 0.2 10*3/uL (ref 0.0–0.5)
Eosinophils Relative: 3 %
HCT: 31.9 % — ABNORMAL LOW (ref 36.0–46.0)
Hemoglobin: 10.1 g/dL — ABNORMAL LOW (ref 12.0–15.0)
Immature Granulocytes: 0 %
Lymphocytes Relative: 13 %
Lymphs Abs: 0.6 10*3/uL — ABNORMAL LOW (ref 0.7–4.0)
MCH: 30.8 pg (ref 26.0–34.0)
MCHC: 31.7 g/dL (ref 30.0–36.0)
MCV: 97.3 fL (ref 80.0–100.0)
Monocytes Absolute: 0.3 10*3/uL (ref 0.1–1.0)
Monocytes Relative: 7 %
Neutro Abs: 3.6 10*3/uL (ref 1.7–7.7)
Neutrophils Relative %: 77 %
Platelets: 91 10*3/uL — ABNORMAL LOW (ref 150–400)
RBC: 3.28 MIL/uL — ABNORMAL LOW (ref 3.87–5.11)
RDW: 18.6 % — ABNORMAL HIGH (ref 11.5–15.5)
WBC: 4.7 10*3/uL (ref 4.0–10.5)
nRBC: 0 % (ref 0.0–0.2)

## 2019-09-13 MED ORDER — DEXAMETHASONE 4 MG PO TABS
10.0000 mg | ORAL_TABLET | Freq: Once | ORAL | Status: AC
  Administered 2019-09-13: 10 mg via ORAL
  Filled 2019-09-13: qty 3

## 2019-09-13 MED ORDER — BORTEZOMIB CHEMO SQ INJECTION 3.5 MG (2.5MG/ML)
1.3000 mg/m2 | Freq: Once | INTRAMUSCULAR | Status: AC
  Administered 2019-09-13: 2.25 mg via SUBCUTANEOUS
  Filled 2019-09-13: qty 0.9

## 2019-09-13 MED ORDER — PROCHLORPERAZINE MALEATE 10 MG PO TABS: 10.0000 mg | ORAL_TABLET | Freq: Once | ORAL | Status: DC

## 2019-09-13 NOTE — Progress Notes (Signed)
Patient tolerated injection with no complaints voiced.  Site clean and dry with no bruising or swelling noted at site.  Band aid applied.  Vss with discharge and left ambulatory with no s/s of distress noted.  

## 2019-09-27 ENCOUNTER — Other Ambulatory Visit: Payer: Self-pay

## 2019-09-27 ENCOUNTER — Other Ambulatory Visit (HOSPITAL_COMMUNITY): Payer: Self-pay | Admitting: Hematology

## 2019-09-27 ENCOUNTER — Inpatient Hospital Stay (HOSPITAL_COMMUNITY): Payer: Medicare PPO

## 2019-09-27 ENCOUNTER — Inpatient Hospital Stay (HOSPITAL_COMMUNITY): Payer: Medicare PPO | Attending: Hematology

## 2019-09-27 VITALS — BP 116/57 | HR 60 | Temp 97.8°F | Resp 18 | Wt 123.5 lb

## 2019-09-27 DIAGNOSIS — F0390 Unspecified dementia without behavioral disturbance: Secondary | ICD-10-CM | POA: Diagnosis not present

## 2019-09-27 DIAGNOSIS — Z5111 Encounter for antineoplastic chemotherapy: Secondary | ICD-10-CM | POA: Diagnosis not present

## 2019-09-27 DIAGNOSIS — C9000 Multiple myeloma not having achieved remission: Secondary | ICD-10-CM

## 2019-09-27 DIAGNOSIS — C884 Extranodal marginal zone B-cell lymphoma of mucosa-associated lymphoid tissue [MALT-lymphoma]: Secondary | ICD-10-CM | POA: Diagnosis not present

## 2019-09-27 LAB — CBC WITH DIFFERENTIAL/PLATELET
Abs Immature Granulocytes: 0.02 10*3/uL (ref 0.00–0.07)
Basophils Absolute: 0 10*3/uL (ref 0.0–0.1)
Basophils Relative: 1 %
Eosinophils Absolute: 0.2 10*3/uL (ref 0.0–0.5)
Eosinophils Relative: 5 %
HCT: 31.4 % — ABNORMAL LOW (ref 36.0–46.0)
Hemoglobin: 9.8 g/dL — ABNORMAL LOW (ref 12.0–15.0)
Immature Granulocytes: 0 %
Lymphocytes Relative: 15 %
Lymphs Abs: 0.7 10*3/uL (ref 0.7–4.0)
MCH: 30.5 pg (ref 26.0–34.0)
MCHC: 31.2 g/dL (ref 30.0–36.0)
MCV: 97.8 fL (ref 80.0–100.0)
Monocytes Absolute: 0.5 10*3/uL (ref 0.1–1.0)
Monocytes Relative: 10 %
Neutro Abs: 3.1 10*3/uL (ref 1.7–7.7)
Neutrophils Relative %: 69 %
Platelets: 130 10*3/uL — ABNORMAL LOW (ref 150–400)
RBC: 3.21 MIL/uL — ABNORMAL LOW (ref 3.87–5.11)
RDW: 18.3 % — ABNORMAL HIGH (ref 11.5–15.5)
WBC: 4.5 10*3/uL (ref 4.0–10.5)
nRBC: 0 % (ref 0.0–0.2)

## 2019-09-27 LAB — COMPREHENSIVE METABOLIC PANEL
ALT: 24 U/L (ref 0–44)
AST: 35 U/L (ref 15–41)
Albumin: 3.1 g/dL — ABNORMAL LOW (ref 3.5–5.0)
Alkaline Phosphatase: 52 U/L (ref 38–126)
Anion gap: 9 (ref 5–15)
BUN: 16 mg/dL (ref 8–23)
CO2: 25 mmol/L (ref 22–32)
Calcium: 9 mg/dL (ref 8.9–10.3)
Chloride: 107 mmol/L (ref 98–111)
Creatinine, Ser: 1.31 mg/dL — ABNORMAL HIGH (ref 0.44–1.00)
GFR calc Af Amer: 46 mL/min — ABNORMAL LOW (ref >60)
GFR calc non Af Amer: 40 mL/min — ABNORMAL LOW (ref >60)
Glucose, Bld: 110 mg/dL — ABNORMAL HIGH (ref 70–99)
Potassium: 4 mmol/L (ref 3.5–5.1)
Sodium: 141 mmol/L (ref 135–145)
Total Bilirubin: 0.4 mg/dL (ref 0.3–1.2)
Total Protein: 7 g/dL (ref 6.5–8.1)

## 2019-09-27 MED ORDER — BORTEZOMIB CHEMO SQ INJECTION 3.5 MG (2.5MG/ML)
1.3000 mg/m2 | Freq: Once | INTRAMUSCULAR | Status: AC
  Administered 2019-09-27: 2.25 mg via SUBCUTANEOUS
  Filled 2019-09-27: qty 0.9

## 2019-09-27 MED ORDER — DEXAMETHASONE 4 MG PO TABS
10.0000 mg | ORAL_TABLET | Freq: Once | ORAL | Status: AC
  Administered 2019-09-27: 10 mg via ORAL

## 2019-09-27 MED ORDER — PROCHLORPERAZINE MALEATE 10 MG PO TABS: 10.0000 mg | ORAL_TABLET | Freq: Once | ORAL | Status: DC

## 2019-09-27 MED ORDER — DEXAMETHASONE 4 MG PO TABS
ORAL_TABLET | ORAL | Status: AC
  Filled 2019-09-27: qty 3

## 2019-09-27 NOTE — Progress Notes (Signed)
Pt presents today for Velcade injection. MAR reviewed and updated. VS within parameters for tx. Pt has no complaints of any changes since the last visit. Labs within parameters for tx and reviewed by [facility] NP. Proceed with tx.    Velcade injection given today per MD orders. Tolerated infusion without adverse affects. Vital signs stable. No complaints at this time. Discharged from clinic ambulatory. F/U with [facility] as scheduled.

## 2019-10-03 ENCOUNTER — Other Ambulatory Visit: Payer: Self-pay

## 2019-10-04 ENCOUNTER — Inpatient Hospital Stay (HOSPITAL_COMMUNITY): Payer: Medicare PPO

## 2019-10-04 ENCOUNTER — Encounter (HOSPITAL_COMMUNITY): Payer: Self-pay

## 2019-10-04 VITALS — BP 104/43 | HR 59 | Temp 97.2°F | Resp 18

## 2019-10-04 DIAGNOSIS — Z5111 Encounter for antineoplastic chemotherapy: Secondary | ICD-10-CM | POA: Diagnosis not present

## 2019-10-04 DIAGNOSIS — C9000 Multiple myeloma not having achieved remission: Secondary | ICD-10-CM

## 2019-10-04 DIAGNOSIS — F0390 Unspecified dementia without behavioral disturbance: Secondary | ICD-10-CM | POA: Diagnosis not present

## 2019-10-04 DIAGNOSIS — C884 Extranodal marginal zone B-cell lymphoma of mucosa-associated lymphoid tissue [MALT-lymphoma]: Secondary | ICD-10-CM | POA: Diagnosis not present

## 2019-10-04 LAB — CBC WITH DIFFERENTIAL/PLATELET
Abs Immature Granulocytes: 0.01 10*3/uL (ref 0.00–0.07)
Basophils Absolute: 0 10*3/uL (ref 0.0–0.1)
Basophils Relative: 1 %
Eosinophils Absolute: 0.1 10*3/uL (ref 0.0–0.5)
Eosinophils Relative: 3 %
HCT: 30.7 % — ABNORMAL LOW (ref 36.0–46.0)
Hemoglobin: 9.7 g/dL — ABNORMAL LOW (ref 12.0–15.0)
Immature Granulocytes: 0 %
Lymphocytes Relative: 20 %
Lymphs Abs: 0.6 10*3/uL — ABNORMAL LOW (ref 0.7–4.0)
MCH: 30.5 pg (ref 26.0–34.0)
MCHC: 31.6 g/dL (ref 30.0–36.0)
MCV: 96.5 fL (ref 80.0–100.0)
Monocytes Absolute: 0.5 10*3/uL (ref 0.1–1.0)
Monocytes Relative: 16 %
Neutro Abs: 2 10*3/uL (ref 1.7–7.7)
Neutrophils Relative %: 60 %
Platelets: 105 10*3/uL — ABNORMAL LOW (ref 150–400)
RBC: 3.18 MIL/uL — ABNORMAL LOW (ref 3.87–5.11)
RDW: 18.5 % — ABNORMAL HIGH (ref 11.5–15.5)
WBC: 3.2 10*3/uL — ABNORMAL LOW (ref 4.0–10.5)
nRBC: 0 % (ref 0.0–0.2)

## 2019-10-04 LAB — COMPREHENSIVE METABOLIC PANEL
ALT: 22 U/L (ref 0–44)
AST: 32 U/L (ref 15–41)
Albumin: 3.1 g/dL — ABNORMAL LOW (ref 3.5–5.0)
Alkaline Phosphatase: 46 U/L (ref 38–126)
Anion gap: 8 (ref 5–15)
BUN: 21 mg/dL (ref 8–23)
CO2: 26 mmol/L (ref 22–32)
Calcium: 8.9 mg/dL (ref 8.9–10.3)
Chloride: 107 mmol/L (ref 98–111)
Creatinine, Ser: 1.16 mg/dL — ABNORMAL HIGH (ref 0.44–1.00)
GFR calc Af Amer: 53 mL/min — ABNORMAL LOW (ref >60)
GFR calc non Af Amer: 46 mL/min — ABNORMAL LOW (ref >60)
Glucose, Bld: 106 mg/dL — ABNORMAL HIGH (ref 70–99)
Potassium: 3.6 mmol/L (ref 3.5–5.1)
Sodium: 141 mmol/L (ref 135–145)
Total Bilirubin: 0.4 mg/dL (ref 0.3–1.2)
Total Protein: 6.6 g/dL (ref 6.5–8.1)

## 2019-10-04 LAB — LACTATE DEHYDROGENASE: LDH: 165 U/L (ref 98–192)

## 2019-10-04 MED ORDER — DEXAMETHASONE 4 MG PO TABS
10.0000 mg | ORAL_TABLET | Freq: Once | ORAL | Status: AC
  Administered 2019-10-04: 10 mg via ORAL

## 2019-10-04 MED ORDER — PROCHLORPERAZINE MALEATE 10 MG PO TABS: 10.0000 mg | ORAL_TABLET | Freq: Once | ORAL | Status: DC

## 2019-10-04 MED ORDER — DEXAMETHASONE 4 MG PO TABS
ORAL_TABLET | ORAL | Status: AC
  Filled 2019-10-04: qty 3

## 2019-10-04 MED ORDER — BORTEZOMIB CHEMO SQ INJECTION 3.5 MG (2.5MG/ML)
1.3000 mg/m2 | Freq: Once | INTRAMUSCULAR | Status: AC
  Administered 2019-10-04: 2.25 mg via SUBCUTANEOUS
  Filled 2019-10-04: qty 0.9

## 2019-10-04 NOTE — Progress Notes (Signed)
Pt presents today for Velcade injection. MAR reviewed and updated. VSS. Pt has no complaints today of pain or any significant changes since her last visit.   Velcade  given today per MD orders. Tolerated infusion without adverse affects. Vital signs stable. No complaints at this time. Discharged from clinic ambulatory. F/U with [facility] as scheduled.

## 2019-10-04 NOTE — Patient Instructions (Signed)
[facility] Discharge Instructions for Patients Receiving Chemotherapy  Today you received the following chemotherapy agents   To help prevent nausea and vomiting after your treatment, we encourage you to take your nausea medication   If you develop nausea and vomiting that is not controlled by your nausea medication, call the clinic.   BELOW ARE SYMPTOMS THAT SHOULD BE REPORTED IMMEDIATELY:  *FEVER GREATER THAN 100.5 F  *CHILLS WITH OR WITHOUT FEVER  NAUSEA AND VOMITING THAT IS NOT CONTROLLED WITH YOUR NAUSEA MEDICATION  *UNUSUAL SHORTNESS OF BREATH  *UNUSUAL BRUISING OR BLEEDING  TENDERNESS IN MOUTH AND THROAT WITH OR WITHOUT PRESENCE OF ULCERS  *URINARY PROBLEMS  *BOWEL PROBLEMS  UNUSUAL RASH Items with * indicate a potential emergency and should be followed up as soon as possible.  Feel free to call the clinic should you have any questions or concerns. The clinic phone number is [phone].  Please show the CHEMO ALERT CARD at check-in to the Emergency Department and triage nurse.   

## 2019-10-05 LAB — KAPPA/LAMBDA LIGHT CHAINS
Kappa free light chain: 27.1 mg/L — ABNORMAL HIGH (ref 3.3–19.4)
Kappa, lambda light chain ratio: 0.8 (ref 0.26–1.65)
Lambda free light chains: 33.9 mg/L — ABNORMAL HIGH (ref 5.7–26.3)

## 2019-10-06 LAB — PROTEIN ELECTROPHORESIS, SERUM
A/G Ratio: 0.9 (ref 0.7–1.7)
Albumin ELP: 3 g/dL (ref 2.9–4.4)
Alpha-1-Globulin: 0.2 g/dL (ref 0.0–0.4)
Alpha-2-Globulin: 0.8 g/dL (ref 0.4–1.0)
Beta Globulin: 0.6 g/dL — ABNORMAL LOW (ref 0.7–1.3)
Gamma Globulin: 1.5 g/dL (ref 0.4–1.8)
Globulin, Total: 3.2 g/dL (ref 2.2–3.9)
M-Spike, %: 0.9 g/dL — ABNORMAL HIGH
Total Protein ELP: 6.2 g/dL (ref 6.0–8.5)

## 2019-10-11 ENCOUNTER — Inpatient Hospital Stay (HOSPITAL_BASED_OUTPATIENT_CLINIC_OR_DEPARTMENT_OTHER): Payer: Medicare PPO | Admitting: Hematology

## 2019-10-11 ENCOUNTER — Encounter (HOSPITAL_COMMUNITY): Payer: Self-pay | Admitting: Hematology

## 2019-10-11 ENCOUNTER — Inpatient Hospital Stay (HOSPITAL_COMMUNITY): Payer: Medicare PPO

## 2019-10-11 ENCOUNTER — Other Ambulatory Visit: Payer: Self-pay

## 2019-10-11 VITALS — BP 126/48 | HR 56 | Temp 97.7°F | Resp 18 | Wt 125.1 lb

## 2019-10-11 DIAGNOSIS — F0390 Unspecified dementia without behavioral disturbance: Secondary | ICD-10-CM | POA: Diagnosis not present

## 2019-10-11 DIAGNOSIS — Z5111 Encounter for antineoplastic chemotherapy: Secondary | ICD-10-CM | POA: Diagnosis not present

## 2019-10-11 DIAGNOSIS — C9000 Multiple myeloma not having achieved remission: Secondary | ICD-10-CM

## 2019-10-11 DIAGNOSIS — C884 Extranodal marginal zone B-cell lymphoma of mucosa-associated lymphoid tissue [MALT-lymphoma]: Secondary | ICD-10-CM | POA: Diagnosis not present

## 2019-10-11 LAB — CBC WITH DIFFERENTIAL/PLATELET
Abs Immature Granulocytes: 0.01 10*3/uL (ref 0.00–0.07)
Basophils Absolute: 0 10*3/uL (ref 0.0–0.1)
Basophils Relative: 1 %
Eosinophils Absolute: 0.1 10*3/uL (ref 0.0–0.5)
Eosinophils Relative: 3 %
HCT: 31.7 % — ABNORMAL LOW (ref 36.0–46.0)
Hemoglobin: 10.1 g/dL — ABNORMAL LOW (ref 12.0–15.0)
Immature Granulocytes: 0 %
Lymphocytes Relative: 15 %
Lymphs Abs: 0.7 10*3/uL (ref 0.7–4.0)
MCH: 31.2 pg (ref 26.0–34.0)
MCHC: 31.9 g/dL (ref 30.0–36.0)
MCV: 97.8 fL (ref 80.0–100.0)
Monocytes Absolute: 0.4 10*3/uL (ref 0.1–1.0)
Monocytes Relative: 8 %
Neutro Abs: 3.4 10*3/uL (ref 1.7–7.7)
Neutrophils Relative %: 73 %
Platelets: 98 10*3/uL — ABNORMAL LOW (ref 150–400)
RBC: 3.24 MIL/uL — ABNORMAL LOW (ref 3.87–5.11)
RDW: 18.6 % — ABNORMAL HIGH (ref 11.5–15.5)
WBC: 4.6 10*3/uL (ref 4.0–10.5)
nRBC: 0 % (ref 0.0–0.2)

## 2019-10-11 LAB — COMPREHENSIVE METABOLIC PANEL
ALT: 22 U/L (ref 0–44)
AST: 31 U/L (ref 15–41)
Albumin: 3.2 g/dL — ABNORMAL LOW (ref 3.5–5.0)
Alkaline Phosphatase: 48 U/L (ref 38–126)
Anion gap: 6 (ref 5–15)
BUN: 22 mg/dL (ref 8–23)
CO2: 26 mmol/L (ref 22–32)
Calcium: 8.9 mg/dL (ref 8.9–10.3)
Chloride: 107 mmol/L (ref 98–111)
Creatinine, Ser: 1.11 mg/dL — ABNORMAL HIGH (ref 0.44–1.00)
GFR calc Af Amer: 56 mL/min — ABNORMAL LOW (ref >60)
GFR calc non Af Amer: 49 mL/min — ABNORMAL LOW (ref >60)
Glucose, Bld: 99 mg/dL (ref 70–99)
Potassium: 4 mmol/L (ref 3.5–5.1)
Sodium: 139 mmol/L (ref 135–145)
Total Bilirubin: 0.5 mg/dL (ref 0.3–1.2)
Total Protein: 6.6 g/dL (ref 6.5–8.1)

## 2019-10-11 MED ORDER — BORTEZOMIB CHEMO SQ INJECTION 3.5 MG (2.5MG/ML)
1.3000 mg/m2 | Freq: Once | INTRAMUSCULAR | Status: AC
  Administered 2019-10-11: 16:00:00 2.25 mg via SUBCUTANEOUS
  Filled 2019-10-11: qty 0.9

## 2019-10-11 MED ORDER — DEXAMETHASONE 4 MG PO TABS
10.0000 mg | ORAL_TABLET | Freq: Once | ORAL | Status: AC
  Administered 2019-10-11: 16:00:00 10 mg via ORAL
  Filled 2019-10-11: qty 3

## 2019-10-11 MED ORDER — PROCHLORPERAZINE MALEATE 10 MG PO TABS: 10.0000 mg | ORAL_TABLET | Freq: Once | ORAL | Status: DC

## 2019-10-11 NOTE — Progress Notes (Signed)
Patient tolerated injection with no complaints voiced.  Site clean and dry with no bruising or swelling noted at site.  Band aid applied.  Vss with discharge and left ambulatory with no s/s of distress noted.  

## 2019-10-11 NOTE — Patient Instructions (Signed)
Amanda Wu at [facility] Discharge Instructions  You were seen today by Dr. Delton Coombes. He went over your recent lab results. Continue taking your revlimid as directed. Continue injections. He will see you back in for labs and follow up.   Thank you for choosing [place] at [facility] to provide your oncology and hematology care.  To afford each patient quality time with our provider, please arrive at least 15 minutes before your scheduled appointment time.   If you have a lab appointment with the [facility] please come in thru the  Main Entrance and check in at the main information desk  You need to re-schedule your appointment should you arrive 10 or more minutes late.  We strive to give you quality time with our providers, and arriving late affects you and other patients whose appointments are after yours.  Also, if you no show three or more times for appointments you may be dismissed from the clinic at the providers discretion.     Again, thank you for choosing [facility].  Our hope is that these requests will decrease the amount of time that you wait before being seen by our physicians.       _____________________________________________________________  Should you have questions after your visit to [facility], please contact our office at [phone] between the hours of 8:00 a.m. and 4:30 p.m.  Voicemails left after 4:00 p.m. will not be returned until the following business day.  For prescription refill requests, have your pharmacy contact our office and allow 72 hours.    Cancer Center Support Programs:   > Cancer Support Group  2nd Tuesday of the month 1pm-2pm, Journey Room

## 2019-10-11 NOTE — Progress Notes (Signed)
[address], [place] [address]   CLINIC:  Medical Oncology/Hematology  PCP:  Monico Blitz, MD [place] [address] [phone]   REASON FOR VISIT:  Follow-up for Multiple Myeloma   CURRENT THERAPY: Rev/Velcade/Dex  BRIEF ONCOLOGIC HISTORY:  Oncology History  Multiple myeloma ([place])  07/09/2017 Initial Diagnosis   Multiple myeloma ([place] [facility])   06/01/2018 -  Chemotherapy   The patient had bortezomib SQ (VELCADE) chemo injection 1.75 mg, 1 mg/m2 = 1.75 mg (100 % of original dose 1 mg/m2), Subcutaneous,  Once, 18 of 21 cycles Dose modification: 1 mg/m2 (original dose 1 mg/m2, Cycle 1, Reason: Provider Judgment), 1.3 mg/m2 (original dose 1 mg/m2, Cycle 8, Reason: Provider Judgment) Administration: 1.75 mg (06/01/2018), 1.75 mg (06/08/2018), 1.75 mg (06/15/2018), 1.75 mg (06/29/2018), 1.75 mg (07/06/2018), 1.75 mg (07/13/2018), 1.75 mg (07/27/2018), 1.75 mg (08/03/2018), 1.75 mg (08/10/2018), 1.75 mg (08/24/2018), 1.75 mg (08/31/2018), 1.75 mg (09/07/2018), 1.75 mg (09/21/2018), 1.75 mg (09/28/2018), 1.75 mg (10/05/2018), 1.75 mg (10/19/2018), 1.75 mg (10/26/2018), 1.75 mg (11/02/2018), 1.75 mg (11/16/2018), 1.75 mg (11/23/2018), 1.75 mg (11/30/2018), 2.25 mg (12/17/2018), 2.25 mg (12/24/2018), 2.25 mg (01/04/2019), 2.25 mg (01/18/2019), 2.25 mg (01/25/2019), 2.25 mg (02/01/2019), 2.25 mg (02/15/2019), 2.25 mg (02/22/2019), 2.25 mg (03/01/2019), 2.25 mg (03/15/2019), 2.25 mg (03/23/2019), 2.25 mg (03/31/2019), 2.25 mg (04/12/2019), 2.25 mg (04/19/2019), 2.25 mg (04/26/2019), 2.25 mg (05/10/2019), 2.25 mg (05/17/2019), 2.25 mg (05/24/2019), 2.25 mg (06/07/2019), 2.25 mg (06/14/2019), 2.25 mg (06/21/2019), 2.25 mg (07/05/2019), 2.25 mg (07/12/2019), 2.25 mg (07/19/2019), 2.25 mg (08/02/2019), 2.25 mg (08/09/2019), 2.25 mg (08/16/2019), 2.25 mg (08/30/2019), 2.25 mg (09/06/2019), 2.25 mg (09/13/2019), 2.25 mg (09/27/2019), 2.25 mg (10/04/2019)  for chemotherapy treatment.        INTERVAL HISTORY:  Ms.  Remmers 73 y.o. female seen for multiple myeloma.  She is tolerating Revlimid 2 weeks on 2 weeks off very well.  Denies any nausea, vomiting, diarrhea or constipation.  Denies any tingling or numbness in the extremities.  She is also taking dexamethasone 10 mg on days of Velcade.  Continues to struggle with some dementia.  She is drinking 1 can of Ensure per day.  Denies any new onset pains.  Appetite and energy levels are reported as 100%.  Patient reports mild diarrhea when she drinks Ensure.  REVIEW OF SYSTEMS:  Review of Systems  Gastrointestinal: Positive for diarrhea.  All other systems reviewed and are negative.    PAST MEDICAL/SURGICAL HISTORY:  Past Medical History:  Diagnosis Date  . Benign hypertension   . Cataract   . 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 Diabetes mellitus without complication ([place])   . H/O partial nephrectomy   . Hypokalemia   . Hypothyroidism   . Impaired cognition   . Multiple myeloma (HCC)    Dr Maylon Peppers, [facility]  . Thyroid disease    Past Surgical History:  Procedure Laterality Date  . ABDOMINAL HYSTERECTOMY    . CRANIOTOMY     for lymphoma  . YAG LASER APPLICATION Right 12/24/3157   Procedure: YAG LASER APPLICATION;  Surgeon: [name], MD;  Location: [facility];  Service: Ophthalmology;  Laterality: Right;     SOCIAL HISTORY:  Social History   Socioeconomic History  . Marital status: Single    Spouse name: Not on file  . Number of children: Not on file  . Years of education: Not on file  . Highest education level: Not on file  Occupational History  . Not on file  Social Needs  . Financial resource strain: Not on file  . Food insecurity    Worry: Not on file    Inability: Not on file  . Transportation needs    Medical: Not on file    Non-medical: Not on file  Tobacco Use  . Smoking status: Never Smoker  . Smokeless tobacco: Never Used  Substance and Sexual Activity  . Alcohol use: No  . Drug use: No  . Sexual activity: Not  on file  Lifestyle  . Physical activity    Days per week: Not on file    Minutes per session: Not on file  . Stress: Not on file  Relationships  . Social Herbalist on phone: Not on file    Gets together: Not on file    Attends religious service: Not on file    Active member of club or organization: Not on file    Attends meetings of clubs or organizations: Not on file    Relationship status: Not on file  . Intimate partner violence    Fear of current or ex partner: Not on file    Emotionally abused: Not on file    Physically abused: Not on file    Forced sexual activity: Not on file  Other Topics Concern  . Not on file  Social History Narrative  . Not on file    FAMILY HISTORY:  Family History  Problem Relation Age of Onset  . Depression Mother   . Diabetes Mother   . Heart disease Father   . Cancer - Prostate Brother   . Cancer Brother   . Cancer Sister     CURRENT MEDICATIONS:  Outpatient Encounter Medications as of 10/11/2019  Medication Sig Note  . ACCU-CHEK SMARTVIEW test strip    . acyclovir (ZOVIRAX) 400 MG tablet Take 1 tablet (400 mg total) by mouth 2 (two) times daily.   Marland Kitchen amLODipine (NORVASC) 5 MG tablet Take 5 mg by mouth daily.    Marland Kitchen aspirin EC 81 MG tablet Take 81 mg by mouth daily.   . bortezomib IV (VELCADE) 3.5 MG injection Inject into the vein.   . calcium-vitamin D (OSCAL WITH D) 500-200 MG-UNIT tablet Take 1 tablet by mouth daily with breakfast.   . dexamethasone (DECADRON) 4 MG tablet TAKE TWO AND ONE-HALF TABLETS BY MOUTH WEEKLY FOR THREE WEEKS (THREE WEEKS ON, ONE WEEK OFF) IN A ROW AND REPEAT MONTHLY 06/22/2018: Patient states that she is given the dexamethasone while here getting her velcade injection  . donepezil (ARICEPT) 10 MG tablet TAKE 1 TABLET BY MOUTH every evening   . ferrous sulfate 325 (65 FE) MG tablet Take 325 mg by mouth daily with breakfast.   . lenalidomide (REVLIMID) 10 MG capsule TAKE 1 CAPSULE BY MOUTH  DAILY FOR 14  DAYS ON THEN  14 DAYS OFF   . levothyroxine (SYNTHROID, LEVOTHROID) 25 MCG tablet Take 25 mcg by mouth daily before breakfast.   . losartan (COZAAR) 100 MG tablet Take 100 mg by mouth daily.   . Multiple Vitamins-Minerals (MULTIVITAMIN WOMEN 50+ PO) Take by mouth.   . potassium chloride (K-DUR) 10 MEQ tablet Take 3 tablets (30 mEq total) by mouth daily.   Marland Kitchen acetaminophen (TYLENOL) 500 MG tablet Take 1,000 mg by mouth every 6 (six) hours as needed for mild pain.     Facility-Administered Encounter Medications as of 10/11/2019  Medication  . prochlorperazine (COMPAZINE) tablet 10 mg    ALLERGIES:  Allergies  Allergen Reactions  . Lotensin [Benazepril Hcl]     Facial swelling (Angioedema) Note: ARBs should also be contraindicated, but she is on Losartan!     PHYSICAL EXAM:  ECOG Performance status: 1  Vitals:   10/11/19 1517  BP: (!) 126/48  Pulse: (!) 56  Resp: 18  Temp: 97.7 F (36.5 C)  SpO2: 100%   Filed Weights   10/11/19 1517  Weight: 125 lb 1.6 oz (56.7 kg)    Physical Exam Vitals signs reviewed.  Constitutional:      Appearance: Normal appearance.  HENT:     Head: Normocephalic.     Nose: Nose normal.     Mouth/Throat:     Mouth: Mucous membranes are moist.     Pharynx: Oropharynx is clear.  Eyes:     Extraocular Movements: Extraocular movements intact.     Conjunctiva/sclera: Conjunctivae normal.  Neck:     Musculoskeletal: Normal range of motion.  Cardiovascular:     Rate and Rhythm: Normal rate and regular rhythm.     Pulses: Normal pulses.     Heart sounds: Normal heart sounds.  Pulmonary:     Effort: Pulmonary effort is normal.     Breath sounds: Normal breath sounds.  Abdominal:     General: Bowel sounds are normal.     Palpations: Abdomen is soft.  Musculoskeletal: Normal range of motion.  Skin:    General: Skin is warm and dry.  Neurological:     Mental Status: She is alert. Mental status is at baseline.  Psychiatric:        Mood and  Affect: Mood normal.        Behavior: Behavior normal.      LABORATORY DATA:  I have reviewed the labs as listed.  CBC    Component Value Date/Time   WBC 4.6 10/11/2019 1447   RBC 3.24 (L) 10/11/2019 1447   HGB 10.1 (L) 10/11/2019 1447   HCT 31.7 (L) 10/11/2019 1447   PLT 98 (L) 10/11/2019 1447   MCV 97.8 10/11/2019 1447   MCH 31.2 10/11/2019 1447   MCHC 31.9 10/11/2019 1447   RDW 18.6 (H) 10/11/2019 1447   LYMPHSABS 0.7 10/11/2019 1447   MONOABS 0.4 10/11/2019 1447   EOSABS 0.1 10/11/2019 1447   BASOSABS 0.0 10/11/2019 1447   CMP Latest Ref Rng & Units 10/11/2019 10/04/2019 09/27/2019  Glucose 70 - 99 mg/dL 99 106(H) 110(H)  BUN 8 - 23 mg/dL '22 21 16  ' Creatinine 0.44 - 1.00 mg/dL 1.11(H) 1.16(H) 1.31(H)  Sodium 135 - 145 mmol/L 139 141 141  Potassium 3.5 - 5.1 mmol/L 4.0 3.6 4.0  Chloride 98 - 111 mmol/L 107 107 107  CO2 22 - 32 mmol/L '26 26 25  ' Calcium 8.9 - 10.3 mg/dL 8.9 8.9 9.0  Total Protein 6.5 - 8.1 g/dL 6.6 6.6 7.0  Total Bilirubin 0.3 - 1.2 mg/dL 0.5 0.4 0.4  Alkaline Phos 38 - 126 U/L 48 46 52  AST 15 - 41 U/L 31 32 35  ALT 0 - 44 U/L '22 22 24     ' I have independently reviewed scans and discussed with the patient.     ASSESSMENT & PLAN:   Multiple myeloma (HCC) 1.  IgG lambda multiple myeloma: - Revlimid 25 mg and dexamethasone 3 weeks on 1 week off from September 2009 through November 2017. - Velcade 1 mg per metered squared 3 weeks on 1 week off started on 06/01/2018, dose increased to 1.3 mg/M squared on 12/16/2018. -  Revlimid 10 mg 2 weeks on 2 weeks off started on 08/14/2018 for better response.  She also takes dexamethasone 10 mg on days of Velcade. -She is continuing to tolerate Velcade 3 weeks on 1 week off very well.  She is also tolerating Revlimid 2weeks, 2 weeks off very well. -We reviewed myeloma panel from 10/04/2019.  M spike improved to 0.9 g from 1.1 g.  Kappa light chains improved to 27.1, from 42.7.  Lambda light chains improved from  57.6 to 33.9.  Ratio is 0.8. -We will continue the same regimen at this time.  I will see her back in 6 weeks for follow-up.  Plan to repeat myeloma labs in 4 weeks.  2.  Bone strengthening: - She received Zometa monthly starting September 2009, has been off of it for several years.  3.  CNS MALT like lymphoma: - Treated with chemotherapy and radiation, last brain MRI in 2013, CT of the brain in 2018 with no evidence of recurrence. -She continues Aricept 10 mg daily for dementia.  4.  Shingles prophylaxis: -She will continue acyclovir 400 mg twice daily.    Total time spent is 25 minutes with more than 50% of the time spent face-to-face discussing treatment plan and coordination of care.    Orders placed this encounter:  Orders Placed This Encounter  Procedures  . CBC with Differential/Platelet  . Comprehensive metabolic panel  . Protein electrophoresis, serum  . Kappa/lambda light chains  . Lactate dehydrogenase      Derek Jack, MD [place] [phone]

## 2019-10-11 NOTE — Assessment & Plan Note (Signed)
1.  IgG lambda multiple myeloma: - Revlimid 25 mg and dexamethasone 3 weeks on 1 week off from September 2009 through November 2017. - Velcade 1 mg per metered squared 3 weeks on 1 week off started on 06/01/2018, dose increased to 1.3 mg/M squared on 12/16/2018. -Revlimid 10 mg 2 weeks on 2 weeks off started on 08/14/2018 for better response.  She also takes dexamethasone 10 mg on days of Velcade. -She is continuing to tolerate Velcade 3 weeks on 1 week off very well.  She is also tolerating Revlimid 2weeks, 2 weeks off very well. -We reviewed myeloma panel from 10/04/2019.  M spike improved to 0.9 g from 1.1 g.  Kappa light chains improved to 27.1, from 42.7.  Lambda light chains improved from 57.6 to 33.9.  Ratio is 0.8. -We will continue the same regimen at this time.  I will see her back in 6 weeks for follow-up.  Plan to repeat myeloma labs in 4 weeks.  2.  Bone strengthening: - She received Zometa monthly starting September 2009, has been off of it for several years.  3.  CNS MALT like lymphoma: - Treated with chemotherapy and radiation, last brain MRI in 2013, CT of the brain in 2018 with no evidence of recurrence. -She continues Aricept 10 mg daily for dementia.  4.  Shingles prophylaxis: -She will continue acyclovir 400 mg twice daily.

## 2019-10-19 ENCOUNTER — Other Ambulatory Visit (HOSPITAL_COMMUNITY): Payer: Self-pay | Admitting: Nurse Practitioner

## 2019-10-24 ENCOUNTER — Other Ambulatory Visit (HOSPITAL_COMMUNITY): Payer: Self-pay | Admitting: Hematology

## 2019-10-24 DIAGNOSIS — C9000 Multiple myeloma not having achieved remission: Secondary | ICD-10-CM

## 2019-10-25 ENCOUNTER — Inpatient Hospital Stay (HOSPITAL_COMMUNITY): Payer: Medicare PPO | Attending: Hematology

## 2019-10-25 ENCOUNTER — Inpatient Hospital Stay (HOSPITAL_COMMUNITY): Payer: Medicare PPO

## 2019-10-25 ENCOUNTER — Other Ambulatory Visit: Payer: Self-pay

## 2019-10-25 VITALS — BP 106/52 | HR 65 | Temp 97.9°F | Resp 18 | Wt 120.0 lb

## 2019-10-25 DIAGNOSIS — Z5111 Encounter for antineoplastic chemotherapy: Secondary | ICD-10-CM | POA: Diagnosis not present

## 2019-10-25 DIAGNOSIS — C9000 Multiple myeloma not having achieved remission: Secondary | ICD-10-CM

## 2019-10-25 LAB — COMPREHENSIVE METABOLIC PANEL
ALT: 22 U/L (ref 0–44)
AST: 31 U/L (ref 15–41)
Albumin: 3.2 g/dL — ABNORMAL LOW (ref 3.5–5.0)
Alkaline Phosphatase: 56 U/L (ref 38–126)
Anion gap: 8 (ref 5–15)
BUN: 19 mg/dL (ref 8–23)
CO2: 24 mmol/L (ref 22–32)
Calcium: 8.8 mg/dL — ABNORMAL LOW (ref 8.9–10.3)
Chloride: 106 mmol/L (ref 98–111)
Creatinine, Ser: 1.28 mg/dL — ABNORMAL HIGH (ref 0.44–1.00)
GFR calc Af Amer: 47 mL/min — ABNORMAL LOW (ref >60)
GFR calc non Af Amer: 41 mL/min — ABNORMAL LOW (ref >60)
Glucose, Bld: 95 mg/dL (ref 70–99)
Potassium: 3.6 mmol/L (ref 3.5–5.1)
Sodium: 138 mmol/L (ref 135–145)
Total Bilirubin: 0.5 mg/dL (ref 0.3–1.2)
Total Protein: 6.7 g/dL (ref 6.5–8.1)

## 2019-10-25 LAB — CBC WITH DIFFERENTIAL/PLATELET
Abs Immature Granulocytes: 0.01 10*3/uL (ref 0.00–0.07)
Basophils Absolute: 0.1 10*3/uL (ref 0.0–0.1)
Basophils Relative: 1 %
Eosinophils Absolute: 0.3 10*3/uL (ref 0.0–0.5)
Eosinophils Relative: 5 %
HCT: 30.8 % — ABNORMAL LOW (ref 36.0–46.0)
Hemoglobin: 10.1 g/dL — ABNORMAL LOW (ref 12.0–15.0)
Immature Granulocytes: 0 %
Lymphocytes Relative: 24 %
Lymphs Abs: 1.3 10*3/uL (ref 0.7–4.0)
MCH: 31.3 pg (ref 26.0–34.0)
MCHC: 32.8 g/dL (ref 30.0–36.0)
MCV: 95.4 fL (ref 80.0–100.0)
Monocytes Absolute: 0.6 10*3/uL (ref 0.1–1.0)
Monocytes Relative: 11 %
Neutro Abs: 3.2 10*3/uL (ref 1.7–7.7)
Neutrophils Relative %: 59 %
Platelets: 131 10*3/uL — ABNORMAL LOW (ref 150–400)
RBC: 3.23 MIL/uL — ABNORMAL LOW (ref 3.87–5.11)
RDW: 17.7 % — ABNORMAL HIGH (ref 11.5–15.5)
WBC: 5.4 10*3/uL (ref 4.0–10.5)
nRBC: 0 % (ref 0.0–0.2)

## 2019-10-25 MED ORDER — DEXAMETHASONE 4 MG PO TABS
10.0000 mg | ORAL_TABLET | Freq: Once | ORAL | Status: AC
  Administered 2019-10-25: 10 mg via ORAL

## 2019-10-25 MED ORDER — BORTEZOMIB CHEMO SQ INJECTION 3.5 MG (2.5MG/ML)
1.3000 mg/m2 | Freq: Once | INTRAMUSCULAR | Status: AC
  Administered 2019-10-25: 2.25 mg via SUBCUTANEOUS
  Filled 2019-10-25: qty 0.9

## 2019-10-25 NOTE — Progress Notes (Signed)
Patient presents today for Velcade injection. MAR reviewed and updated. Vital signs stable. Pt has no complaints of any pain or changes since her last visit. Labs reviewed by RNester NP. Proceed with treatment order received.   Treatment given today per MD orders. Tolerated injection without adverse affects. Vital signs stable. No complaints at this time. Discharged from clinic ambulatory. F/U with [facility] as scheduled.

## 2019-10-25 NOTE — Patient Instructions (Signed)

## 2019-10-31 ENCOUNTER — Encounter (HOSPITAL_COMMUNITY): Payer: Self-pay | Admitting: *Deleted

## 2019-10-31 NOTE — Progress Notes (Signed)
I received a phone call today from [place] sister.  They all 3 live together. All 3 sisters are exhibiting symptoms of the covid-19 virus.  They are going to be tested today.  I informed her of our policy that if they test negative they will still need to be symptom free for 7 days.  If they test positive they will not be able to come for 21 days as long as they are symptom free.  She states that she will call us back with their results.

## 2019-11-01 ENCOUNTER — Other Ambulatory Visit: Payer: Self-pay

## 2019-11-01 ENCOUNTER — Ambulatory Visit (HOSPITAL_COMMUNITY): Payer: Medicare PPO

## 2019-11-01 ENCOUNTER — Other Ambulatory Visit (HOSPITAL_COMMUNITY): Payer: Medicare PPO

## 2019-11-01 DIAGNOSIS — Z20822 Contact with and (suspected) exposure to covid-19: Secondary | ICD-10-CM

## 2019-11-01 DIAGNOSIS — Z20828 Contact with and (suspected) exposure to other viral communicable diseases: Secondary | ICD-10-CM | POA: Diagnosis not present

## 2019-11-02 LAB — NOVEL CORONAVIRUS, NAA: SARS-CoV-2, NAA: NOT DETECTED

## 2019-11-08 ENCOUNTER — Inpatient Hospital Stay (HOSPITAL_COMMUNITY): Payer: Medicare PPO

## 2019-11-08 ENCOUNTER — Encounter (HOSPITAL_COMMUNITY): Payer: Self-pay

## 2019-11-08 ENCOUNTER — Other Ambulatory Visit: Payer: Self-pay

## 2019-11-08 VITALS — BP 99/54 | HR 58 | Temp 97.8°F | Resp 18

## 2019-11-08 DIAGNOSIS — Z5111 Encounter for antineoplastic chemotherapy: Secondary | ICD-10-CM | POA: Diagnosis not present

## 2019-11-08 DIAGNOSIS — C9000 Multiple myeloma not having achieved remission: Secondary | ICD-10-CM

## 2019-11-08 LAB — CBC WITH DIFFERENTIAL/PLATELET
Abs Immature Granulocytes: 0.01 10*3/uL (ref 0.00–0.07)
Basophils Absolute: 0.1 10*3/uL (ref 0.0–0.1)
Basophils Relative: 2 %
Eosinophils Absolute: 0.1 10*3/uL (ref 0.0–0.5)
Eosinophils Relative: 3 %
HCT: 32 % — ABNORMAL LOW (ref 36.0–46.0)
Hemoglobin: 10.3 g/dL — ABNORMAL LOW (ref 12.0–15.0)
Immature Granulocytes: 0 %
Lymphocytes Relative: 16 %
Lymphs Abs: 0.7 10*3/uL (ref 0.7–4.0)
MCH: 31.3 pg (ref 26.0–34.0)
MCHC: 32.2 g/dL (ref 30.0–36.0)
MCV: 97.3 fL (ref 80.0–100.0)
Monocytes Absolute: 0.3 10*3/uL (ref 0.1–1.0)
Monocytes Relative: 6 %
Neutro Abs: 3.1 10*3/uL (ref 1.7–7.7)
Neutrophils Relative %: 73 %
Platelets: 141 10*3/uL — ABNORMAL LOW (ref 150–400)
RBC: 3.29 MIL/uL — ABNORMAL LOW (ref 3.87–5.11)
RDW: 18.1 % — ABNORMAL HIGH (ref 11.5–15.5)
WBC: 4.3 10*3/uL (ref 4.0–10.5)
nRBC: 0 % (ref 0.0–0.2)

## 2019-11-08 LAB — COMPREHENSIVE METABOLIC PANEL
ALT: 20 U/L (ref 0–44)
AST: 32 U/L (ref 15–41)
Albumin: 3.2 g/dL — ABNORMAL LOW (ref 3.5–5.0)
Alkaline Phosphatase: 47 U/L (ref 38–126)
Anion gap: 3 — ABNORMAL LOW (ref 5–15)
BUN: 19 mg/dL (ref 8–23)
CO2: 31 mmol/L (ref 22–32)
Calcium: 9 mg/dL (ref 8.9–10.3)
Chloride: 104 mmol/L (ref 98–111)
Creatinine, Ser: 1.16 mg/dL — ABNORMAL HIGH (ref 0.44–1.00)
GFR calc Af Amer: 53 mL/min — ABNORMAL LOW (ref >60)
GFR calc non Af Amer: 46 mL/min — ABNORMAL LOW (ref >60)
Glucose, Bld: 85 mg/dL (ref 70–99)
Potassium: 4 mmol/L (ref 3.5–5.1)
Sodium: 138 mmol/L (ref 135–145)
Total Bilirubin: 0.8 mg/dL (ref 0.3–1.2)
Total Protein: 6.8 g/dL (ref 6.5–8.1)

## 2019-11-08 MED ORDER — PROCHLORPERAZINE MALEATE 10 MG PO TABS: 10.0000 mg | ORAL_TABLET | Freq: Once | ORAL | Status: DC

## 2019-11-08 MED ORDER — DEXAMETHASONE 4 MG PO TABS
10.0000 mg | ORAL_TABLET | Freq: Once | ORAL | Status: AC
  Administered 2019-11-08: 14:00:00 10 mg via ORAL
  Filled 2019-11-08: qty 3

## 2019-11-08 MED ORDER — BORTEZOMIB CHEMO SQ INJECTION 3.5 MG (2.5MG/ML)
1.3000 mg/m2 | Freq: Once | INTRAMUSCULAR | Status: AC
  Administered 2019-11-08: 2.25 mg via SUBCUTANEOUS
  Filled 2019-11-08: qty 0.9

## 2019-11-08 NOTE — Patient Instructions (Signed)
[facility] Discharge Instructions for Patients Receiving Chemotherapy   Beginning January 23rd 2017 lab work for the Cancer Center will be done in the  Main lab at [facility] on 1st floor. If you have a lab appointment with the Cancer Center please come in thru the  Main Entrance and check in at the main information desk   Today you received the following chemotherapy agents Velcade injection. Follow-up as scheduled. Call clinic for any questions or concerns  To help prevent nausea and vomiting after your treatment, we encourage you to take your nausea medication   If you develop nausea and vomiting, or diarrhea that is not controlled by your medication, call the clinic.  The clinic phone number is [phone]. Office hours are Monday-Friday 8:30am-5:00pm.  BELOW ARE SYMPTOMS THAT SHOULD BE REPORTED IMMEDIATELY:  *FEVER GREATER THAN 101.0 F  *CHILLS WITH OR WITHOUT FEVER  NAUSEA AND VOMITING THAT IS NOT CONTROLLED WITH YOUR NAUSEA MEDICATION  *UNUSUAL SHORTNESS OF BREATH  *UNUSUAL BRUISING OR BLEEDING  TENDERNESS IN MOUTH AND THROAT WITH OR WITHOUT PRESENCE OF ULCERS  *URINARY PROBLEMS  *BOWEL PROBLEMS  UNUSUAL RASH Items with * indicate a potential emergency and should be followed up as soon as possible. If you have an emergency after office hours please contact your primary care physician or go to the nearest emergency department.  Please call the clinic during office hours if you have any questions or concerns.   You may also contact the Patient Navigator at [phone] should you have any questions or need assistance in obtaining follow up care.      Resources For Cancer Patients and their Caregivers ? American Cancer Society: Can assist with transportation, wigs, general needs, runs Look Good Feel Better.        1-888-227-6333 ? Cancer Care: Provides financial assistance, online support groups, medication/co-pay assistance.   1-800-813-HOPE (4673) ? [facility] Assists [place] cancer patients and their families through emotional , educational and financial support.  336-427-4357 ? [place] DSS Where to apply for food stamps, Medicaid and utility assistance. 336-342-1394 ? [facility]: Transportation to medical appointments. 336-347-2287 ? Social Security Administration: May apply for disability if have a Stage IV cancer. 336-342-7796 1-800-772-1213 ? [place] Aging, Disability and Transit Services: Assists with nutrition, care and transit needs. 336-349-2343         

## 2019-11-08 NOTE — Progress Notes (Signed)
11/08/19  Today's treatment will be day 15 - day 8 will be omitted this cycle.  V.O. Dr Rhys Martini, PharmD

## 2019-11-08 NOTE — Progress Notes (Signed)
Amanda Wu tolerated Velcade injection well without complaints or incident. Labs reviewed prior to administering this medication. VSS Pt discharged self ambulatory in satisfactory condition accompanied by her sister

## 2019-11-18 DIAGNOSIS — E1165 Type 2 diabetes mellitus with hyperglycemia: Secondary | ICD-10-CM | POA: Diagnosis not present

## 2019-11-18 DIAGNOSIS — Z1331 Encounter for screening for depression: Secondary | ICD-10-CM | POA: Diagnosis not present

## 2019-11-18 DIAGNOSIS — E7800 Pure hypercholesterolemia, unspecified: Secondary | ICD-10-CM | POA: Diagnosis not present

## 2019-11-18 DIAGNOSIS — E0590 Thyrotoxicosis, unspecified without thyrotoxic crisis or storm: Secondary | ICD-10-CM | POA: Diagnosis not present

## 2019-11-18 DIAGNOSIS — Z79899 Other long term (current) drug therapy: Secondary | ICD-10-CM | POA: Diagnosis not present

## 2019-11-18 DIAGNOSIS — Z0000 Encounter for general adult medical examination without abnormal findings: Secondary | ICD-10-CM | POA: Diagnosis not present

## 2019-11-18 DIAGNOSIS — Z7189 Other specified counseling: Secondary | ICD-10-CM | POA: Diagnosis not present

## 2019-11-18 DIAGNOSIS — D649 Anemia, unspecified: Secondary | ICD-10-CM | POA: Diagnosis not present

## 2019-11-18 DIAGNOSIS — Z1339 Encounter for screening examination for other mental health and behavioral disorders: Secondary | ICD-10-CM | POA: Diagnosis not present

## 2019-11-22 ENCOUNTER — Encounter (HOSPITAL_COMMUNITY): Payer: Self-pay | Admitting: Hematology

## 2019-11-22 ENCOUNTER — Other Ambulatory Visit (HOSPITAL_COMMUNITY): Payer: Self-pay | Admitting: Nurse Practitioner

## 2019-11-22 ENCOUNTER — Inpatient Hospital Stay (HOSPITAL_COMMUNITY): Payer: Medicare PPO | Attending: Hematology

## 2019-11-22 ENCOUNTER — Other Ambulatory Visit: Payer: Self-pay

## 2019-11-22 ENCOUNTER — Other Ambulatory Visit (HOSPITAL_COMMUNITY): Payer: Self-pay | Admitting: *Deleted

## 2019-11-22 ENCOUNTER — Inpatient Hospital Stay (HOSPITAL_BASED_OUTPATIENT_CLINIC_OR_DEPARTMENT_OTHER): Payer: Medicare PPO | Admitting: Hematology

## 2019-11-22 ENCOUNTER — Inpatient Hospital Stay (HOSPITAL_COMMUNITY): Payer: Medicare PPO

## 2019-11-22 VITALS — BP 93/56 | HR 60 | Temp 98.7°F | Resp 14 | Wt 118.3 lb

## 2019-11-22 DIAGNOSIS — Z5111 Encounter for antineoplastic chemotherapy: Secondary | ICD-10-CM | POA: Insufficient documentation

## 2019-11-22 DIAGNOSIS — E039 Hypothyroidism, unspecified: Secondary | ICD-10-CM | POA: Insufficient documentation

## 2019-11-22 DIAGNOSIS — Z8572 Personal history of non-Hodgkin lymphomas: Secondary | ICD-10-CM | POA: Diagnosis not present

## 2019-11-22 DIAGNOSIS — R197 Diarrhea, unspecified: Secondary | ICD-10-CM | POA: Insufficient documentation

## 2019-11-22 DIAGNOSIS — C9000 Multiple myeloma not having achieved remission: Secondary | ICD-10-CM | POA: Diagnosis not present

## 2019-11-22 DIAGNOSIS — Z79899 Other long term (current) drug therapy: Secondary | ICD-10-CM | POA: Diagnosis not present

## 2019-11-22 DIAGNOSIS — I10 Essential (primary) hypertension: Secondary | ICD-10-CM | POA: Diagnosis not present

## 2019-11-22 DIAGNOSIS — E118 Type 2 diabetes mellitus with unspecified complications: Secondary | ICD-10-CM | POA: Insufficient documentation

## 2019-11-22 DIAGNOSIS — E876 Hypokalemia: Secondary | ICD-10-CM | POA: Diagnosis not present

## 2019-11-22 LAB — COMPREHENSIVE METABOLIC PANEL
ALT: 22 U/L (ref 0–44)
AST: 30 U/L (ref 15–41)
Albumin: 3.4 g/dL — ABNORMAL LOW (ref 3.5–5.0)
Alkaline Phosphatase: 53 U/L (ref 38–126)
Anion gap: 11 (ref 5–15)
BUN: 19 mg/dL (ref 8–23)
CO2: 24 mmol/L (ref 22–32)
Calcium: 9.2 mg/dL (ref 8.9–10.3)
Chloride: 105 mmol/L (ref 98–111)
Creatinine, Ser: 1.26 mg/dL — ABNORMAL HIGH (ref 0.44–1.00)
GFR calc Af Amer: 48 mL/min — ABNORMAL LOW (ref >60)
GFR calc non Af Amer: 42 mL/min — ABNORMAL LOW (ref >60)
Glucose, Bld: 94 mg/dL (ref 70–99)
Potassium: 3.5 mmol/L (ref 3.5–5.1)
Sodium: 140 mmol/L (ref 135–145)
Total Bilirubin: 0.6 mg/dL (ref 0.3–1.2)
Total Protein: 7 g/dL (ref 6.5–8.1)

## 2019-11-22 LAB — CBC WITH DIFFERENTIAL/PLATELET
Abs Immature Granulocytes: 0.01 10*3/uL (ref 0.00–0.07)
Basophils Absolute: 0 10*3/uL (ref 0.0–0.1)
Basophils Relative: 1 %
Eosinophils Absolute: 0.3 10*3/uL (ref 0.0–0.5)
Eosinophils Relative: 6 %
HCT: 33.2 % — ABNORMAL LOW (ref 36.0–46.0)
Hemoglobin: 10.6 g/dL — ABNORMAL LOW (ref 12.0–15.0)
Immature Granulocytes: 0 %
Lymphocytes Relative: 22 %
Lymphs Abs: 1 10*3/uL (ref 0.7–4.0)
MCH: 31.1 pg (ref 26.0–34.0)
MCHC: 31.9 g/dL (ref 30.0–36.0)
MCV: 97.4 fL (ref 80.0–100.0)
Monocytes Absolute: 0.6 10*3/uL (ref 0.1–1.0)
Monocytes Relative: 12 %
Neutro Abs: 2.8 10*3/uL (ref 1.7–7.7)
Neutrophils Relative %: 59 %
Platelets: 123 10*3/uL — ABNORMAL LOW (ref 150–400)
RBC: 3.41 MIL/uL — ABNORMAL LOW (ref 3.87–5.11)
RDW: 18.1 % — ABNORMAL HIGH (ref 11.5–15.5)
WBC: 4.7 10*3/uL (ref 4.0–10.5)
nRBC: 0 % (ref 0.0–0.2)

## 2019-11-22 LAB — LACTATE DEHYDROGENASE: LDH: 165 U/L (ref 98–192)

## 2019-11-22 MED ORDER — BORTEZOMIB CHEMO SQ INJECTION 3.5 MG (2.5MG/ML)
1.3000 mg/m2 | Freq: Once | INTRAMUSCULAR | Status: AC
  Administered 2019-11-22: 2.25 mg via SUBCUTANEOUS
  Filled 2019-11-22: qty 0.9

## 2019-11-22 MED ORDER — LENALIDOMIDE 10 MG PO CAPS: ORAL_CAPSULE | ORAL | 0 refills | Status: DC

## 2019-11-22 MED ORDER — DEXAMETHASONE 4 MG PO TABS
10.0000 mg | ORAL_TABLET | Freq: Once | ORAL | Status: AC
  Administered 2019-11-22: 15:00:00 10 mg via ORAL
  Filled 2019-11-22: qty 3

## 2019-11-22 MED ORDER — PROCHLORPERAZINE MALEATE 10 MG PO TABS: 10.0000 mg | ORAL_TABLET | Freq: Once | ORAL | Status: DC

## 2019-11-22 NOTE — Patient Instructions (Addendum)
[place] at [facility] Discharge Instructions  You were seen today by Dr. Delton Coombes. He went over your recent lab results. Your labs look good. Continue your weekly labs. He will see you back in 5 weeks for labs and follow up.   Thank you for choosing [place] at [facility] to provide your oncology and hematology care.  To afford each patient quality time with our provider, please arrive at least 15 minutes before your scheduled appointment time.   If you have a lab appointment with the [facility] please come in thru the  Main Entrance and check in at the main information desk  You need to re-schedule your appointment should you arrive 10 or more minutes late.  We strive to give you quality time with our providers, and arriving late affects you and other patients whose appointments are after yours.  Also, if you no show three or more times for appointments you may be dismissed from the clinic at the providers discretion.     Again, thank you for choosing [facility].  Our hope is that these requests will decrease the amount of time that you wait before being seen by our physicians.       _____________________________________________________________  Should you have questions after your visit to [facility], please contact our office at [phone] between the hours of 8:00 a.m. and 4:30 p.m.  Voicemails left after 4:00 p.m. will not be returned until the following business day.  For prescription refill requests, have your pharmacy contact our office and allow 72 hours.    Cancer Center Support Programs:   > Cancer Support Group  2nd Tuesday of the month 1pm-2pm, Journey Room

## 2019-11-22 NOTE — Progress Notes (Signed)
Patient seen by Dr. Delton Coombes with lab review and ok to treat today verbal order.   Patient tolerated Velcade injection with no complaints voiced.  Site clean and dry with no bruising or swelling noted at site.  Band aid applied.  Vss with discharge and left ambulatory with no s/s of distress noted.

## 2019-11-22 NOTE — Progress Notes (Signed)
[place], [place] [address]   CLINIC:  Medical Oncology/Hematology  PCP:  Monico Blitz, MD [place] [address] [phone]   REASON FOR VISIT:  Follow-up for Multiple Myeloma   CURRENT THERAPY: Rev/Velcade/Dex  BRIEF ONCOLOGIC HISTORY:  Oncology History  Multiple myeloma ([place])  07/09/2017 Initial Diagnosis   Multiple myeloma ([place])   06/01/2018 -  Chemotherapy   The patient had bortezomib SQ (VELCADE) chemo injection 1.75 mg, 1 mg/m2 = 1.75 mg (100 % of original dose 1 mg/m2), Subcutaneous,  Once, 20 of 21 cycles Dose modification: 1 mg/m2 (original dose 1 mg/m2, Cycle 1, Reason: Provider Judgment), 1.3 mg/m2 (original dose 1 mg/m2, Cycle 8, Reason: Provider Judgment) Administration: 1.75 mg (06/01/2018), 1.75 mg (06/08/2018), 1.75 mg (06/15/2018), 1.75 mg (06/29/2018), 1.75 mg (07/06/2018), 1.75 mg (07/13/2018), 1.75 mg (07/27/2018), 1.75 mg (08/03/2018), 1.75 mg (08/10/2018), 1.75 mg (08/24/2018), 1.75 mg (08/31/2018), 1.75 mg (09/07/2018), 1.75 mg (09/21/2018), 1.75 mg (09/28/2018), 1.75 mg (10/05/2018), 1.75 mg (10/19/2018), 1.75 mg (10/26/2018), 1.75 mg (11/02/2018), 1.75 mg (11/16/2018), 1.75 mg (11/23/2018), 1.75 mg (11/30/2018), 2.25 mg (12/17/2018), 2.25 mg (12/24/2018), 2.25 mg (01/04/2019), 2.25 mg (01/18/2019), 2.25 mg (01/25/2019), 2.25 mg (02/01/2019), 2.25 mg (02/15/2019), 2.25 mg (02/22/2019), 2.25 mg (03/01/2019), 2.25 mg (03/15/2019), 2.25 mg (03/23/2019), 2.25 mg (03/31/2019), 2.25 mg (04/12/2019), 2.25 mg (04/19/2019), 2.25 mg (04/26/2019), 2.25 mg (05/10/2019), 2.25 mg (05/17/2019), 2.25 mg (05/24/2019), 2.25 mg (06/07/2019), 2.25 mg (06/14/2019), 2.25 mg (06/21/2019), 2.25 mg (07/05/2019), 2.25 mg (07/12/2019), 2.25 mg (07/19/2019), 2.25 mg (08/02/2019), 2.25 mg (08/09/2019), 2.25 mg (08/16/2019), 2.25 mg (08/30/2019), 2.25 mg (09/06/2019), 2.25 mg (09/13/2019), 2.25 mg (09/27/2019), 2.25 mg (10/04/2019), 2.25 mg (10/11/2019), 2.25 mg (10/25/2019), 2.25 mg (11/08/2019), 2.25  mg (11/22/2019)  for chemotherapy treatment.        INTERVAL HISTORY:  Ms. Wren 73 y.o. female seen for follow-up of multiple myeloma.  Appetite and energy levels are 50%.  Reports occasional diarrhea associated with Revlimid.  Otherwise she is tolerating it very well.  Denies any peripheral neuropathy symptoms.  No fevers, night sweats or weight loss.  Denies any nausea, vomiting, or constipation.  REVIEW OF SYSTEMS:  Review of Systems  Gastrointestinal: Positive for diarrhea.  All other systems reviewed and are negative.    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[place])    Diabetes mellitus without complication ([place])    H/O partial nephrectomy    Hypokalemia    Hypothyroidism    Impaired cognition    Multiple myeloma (HCC)    Dr Maylon Peppers, [facility]   Thyroid disease    Past Surgical History:  Procedure Laterality Date   ABDOMINAL HYSTERECTOMY     CRANIOTOMY     for lymphoma   YAG LASER APPLICATION Right 05/03/8021   Procedure: YAG LASER APPLICATION;  Surgeon: [name], MD;  Location: [facility];  Service: Ophthalmology;  Laterality: Right;     SOCIAL HISTORY:  Social History   Socioeconomic History   Marital status: Single    Spouse name: Not on file   Number of children: Not on file   Years of education: Not on file   Highest education level: Not on file  Occupational History   Not on file  Social Needs   Financial resource strain: Not on file   Food insecurity    Worry: Not on file    Inability: Not  on file   Transportation needs    Medical: Not on file    Non-medical: Not on file  Tobacco Use   Smoking status: Never Smoker   Smokeless tobacco: Never Used  Substance and Sexual Activity   Alcohol use: No   Drug use: No   Sexual activity: Not on file  Lifestyle   Physical activity    Days per week: Not on file    Minutes  per session: Not on file   Stress: Not on file  Relationships   Social connections    Talks on phone: Not on file    Gets together: Not on file    Attends religious service: Not on file    Active member of club or organization: Not on file    Attends meetings of clubs or organizations: Not on file    Relationship status: Not on file   Intimate partner violence    Fear of current or ex partner: Not on file    Emotionally abused: Not on file    Physically abused: Not on file    Forced sexual activity: Not on file  Other Topics Concern   Not on file  Social History Narrative   Not on file    FAMILY HISTORY:  Family History  Problem Relation Age of Onset   Depression Mother    Diabetes Mother    Heart disease Father    Cancer - Prostate Brother    Cancer Brother    Cancer Sister     CURRENT MEDICATIONS:  Outpatient Encounter Medications as of 11/22/2019  Medication Sig Note   ACCU-CHEK SMARTVIEW test strip     Accu-Chek Softclix Lancets lancets     acetaminophen (TYLENOL) 500 MG tablet Take 1,000 mg by mouth every 6 (six) hours as needed for mild pain.     acyclovir (ZOVIRAX) 400 MG tablet Take 1 tablet (400 mg total) by mouth 2 (two) times daily.    amLODipine (NORVASC) 5 MG tablet Take 5 mg by mouth daily.     aspirin EC 81 MG tablet Take 81 mg by mouth daily.    bortezomib IV (VELCADE) 3.5 MG injection Inject into the vein.    calcium-vitamin D (OSCAL WITH D) 500-200 MG-UNIT tablet Take 1 tablet by mouth daily with breakfast.    dexamethasone (DECADRON) 4 MG tablet TAKE TWO AND ONE-HALF TABLETS BY MOUTH WEEKLY FOR THREE WEEKS (THREE WEEKS ON, ONE WEEK OFF) IN A ROW AND REPEAT MONTHLY 06/22/2018: Patient states that she is given the dexamethasone while here getting her velcade injection   donepezil (ARICEPT) 10 MG tablet TAKE 1 TABLET BY MOUTH every evening    ferrous sulfate 325 (65 FE) MG tablet Take 325 mg by mouth daily with breakfast.     levothyroxine (SYNTHROID, LEVOTHROID) 25 MCG tablet Take 25 mcg by mouth daily before breakfast.    losartan (COZAAR) 100 MG tablet Take 100 mg by mouth daily.    Multiple Vitamins-Minerals (MULTIVITAMIN WOMEN 50+ PO) Take by mouth.    potassium chloride (KLOR-CON) 10 MEQ tablet TAKE THREE TABLETS BY MOUTH EVERY DAY    [DISCONTINUED] lenalidomide (REVLIMID) 10 MG capsule TAKE 1 CAPSULE BY MOUTH  DAILY FOR 14 DAYS, THEN 14  DAYS OFF    Facility-Administered Encounter Medications as of 11/22/2019  Medication   prochlorperazine (COMPAZINE) tablet 10 mg    ALLERGIES:  Allergies  Allergen Reactions   Lotensin [Benazepril Hcl]     Facial swelling (Angioedema) Note: ARBs should also be  contraindicated, but she is on Losartan!     PHYSICAL EXAM:  ECOG Performance status: 1  Vitals:   11/22/19 1400  BP: (!) 93/56  Pulse: 60  Resp: 14  Temp: 98.7 F (37.1 C)  SpO2: 100%   Filed Weights   11/22/19 1400  Weight: 118 lb 5 oz (53.7 kg)    Physical Exam Vitals signs reviewed.  Constitutional:      Appearance: Normal appearance.  HENT:     Head: Normocephalic.     Nose: Nose normal.     Mouth/Throat:     Mouth: Mucous membranes are moist.     Pharynx: Oropharynx is clear.  Eyes:     Extraocular Movements: Extraocular movements intact.     Conjunctiva/sclera: Conjunctivae normal.  Neck:     Musculoskeletal: Normal range of motion.  Cardiovascular:     Rate and Rhythm: Normal rate and regular rhythm.     Pulses: Normal pulses.     Heart sounds: Normal heart sounds.  Pulmonary:     Effort: Pulmonary effort is normal.     Breath sounds: Normal breath sounds.  Abdominal:     General: Bowel sounds are normal.     Palpations: Abdomen is soft.  Musculoskeletal: Normal range of motion.  Skin:    General: Skin is warm and dry.  Neurological:     Mental Status: She is alert. Mental status is at baseline.  Psychiatric:        Mood and Affect: Mood normal.         Behavior: Behavior normal.      LABORATORY DATA:  I have reviewed the labs as listed.  CBC    Component Value Date/Time   WBC 4.7 11/22/2019 1334   RBC 3.41 (L) 11/22/2019 1334   HGB 10.6 (L) 11/22/2019 1334   HCT 33.2 (L) 11/22/2019 1334   PLT 123 (L) 11/22/2019 1334   MCV 97.4 11/22/2019 1334   MCH 31.1 11/22/2019 1334   MCHC 31.9 11/22/2019 1334   RDW 18.1 (H) 11/22/2019 1334   LYMPHSABS 1.0 11/22/2019 1334   MONOABS 0.6 11/22/2019 1334   EOSABS 0.3 11/22/2019 1334   BASOSABS 0.0 11/22/2019 1334   CMP Latest Ref Rng & Units 11/22/2019 11/08/2019 10/25/2019  Glucose 70 - 99 mg/dL 94 85 95  BUN 8 - 23 mg/dL _0 Creatinine 0.44 - 1.00 mg/dL 1.26(H) 1.16(H) 1.28(H)  Sodium 135 - 145 mmol/L 140 138 138  Potassium 3.5 - 5.1 mmol/L 3.5 4.0 3.6  Chloride 98 - 111 mmol/L 105 104 106  CO2 22 - 32 mmol/L _1 Calcium 8.9 - 10.3 mg/dL 9.2 9.0 8.8(L)  Total Protein 6.5 - 8.1 g/dL 7.0 6.8 6.7  Total Bilirubin 0.3 - 1.2 mg/dL 0.6 0.8 0.5  Alkaline Phos 38 - 126 U/L 53 47 56  AST 15 - 41 U/L 30 32 31  ALT 0 - 44 U/L _2 I have independently reviewed scans and discussed with the patient.     ASSESSMENT & PLAN:   Multiple myeloma (HCC) 1.  IgG lambda multiple myeloma: - Revlimid 25 mg and dexamethasone 3 weeks on 1 week off from September 2009 through November 2017. - Velcade 1 mg per metered squared 3 weeks on 1 week off started on 06/01/2018, dose increased to 1.3 mg/M squared on 12/16/2018. -Revlimid 10 mg 2 weeks on 2 weeks off started on 08/14/2018 for better response.  She also takes dexamethasone  10 mg on days of Velcade. -She is continuing to tolerate Velcade 3 weeks on 1 week off very well.  She is also tolerating Revlimid 2weeks, 2 weeks off very well. -We reviewed myeloma panel from 10/04/2019 when M spike improved to 0.9 from 1.1 g. -We reviewed labs from today which are grossly within normal limits.  She will continue Velcade and Revlimid as she is  tolerating it well. -Her myeloma labs from today are pending.  We will see her back in 4 to 5 weeks with repeat labs.  2.  Bone strengthening: - She received Zometa monthly starting September 2009, has been off of it for several years.  3.  CNS MALT like lymphoma: - Treated with chemotherapy and radiation, last brain MRI in 2013, CT of the brain in 2018 with no evidence of recurrence. -She continues Aricept 10 mg daily for dementia.  4.  Shingles prophylaxis: -She will continue acyclovir 400 mg twice daily.       Orders placed this encounter:  Orders Placed This Encounter  Procedures   CBC with Differential/Platelet   Comprehensive metabolic panel   Protein electrophoresis, serum   Kappa/lambda light chains   Lactate dehydrogenase      Derek Jack, MD [place] [phone]

## 2019-11-22 NOTE — Assessment & Plan Note (Signed)
1.  IgG lambda multiple myeloma: - Revlimid 25 mg and dexamethasone 3 weeks on 1 week off from September 2009 through November 2017. - Velcade 1 mg per metered squared 3 weeks on 1 week off started on 06/01/2018, dose increased to 1.3 mg/M squared on 12/16/2018. -Revlimid 10 mg 2 weeks on 2 weeks off started on 08/14/2018 for better response.  She also takes dexamethasone 10 mg on days of Velcade. -She is continuing to tolerate Velcade 3 weeks on 1 week off very well.  She is also tolerating Revlimid 2weeks, 2 weeks off very well. -We reviewed myeloma panel from 10/04/2019 when M spike improved to 0.9 from 1.1 g. -We reviewed labs from today which are grossly within normal limits.  She will continue Velcade and Revlimid as she is tolerating it well. -Her myeloma labs from today are pending.  We will see her back in 4 to 5 weeks with repeat labs.  2.  Bone strengthening: - She received Zometa monthly starting September 2009, has been off of it for several years.  3.  CNS MALT like lymphoma: - Treated with chemotherapy and radiation, last brain MRI in 2013, CT of the brain in 2018 with no evidence of recurrence. -She continues Aricept 10 mg daily for dementia.  4.  Shingles prophylaxis: -She will continue acyclovir 400 mg twice daily.

## 2019-11-23 LAB — PROTEIN ELECTROPHORESIS, SERUM
A/G Ratio: 1 (ref 0.7–1.7)
Albumin ELP: 3 g/dL (ref 2.9–4.4)
Alpha-1-Globulin: 0.2 g/dL (ref 0.0–0.4)
Alpha-2-Globulin: 0.7 g/dL (ref 0.4–1.0)
Beta Globulin: 0.7 g/dL (ref 0.7–1.3)
Gamma Globulin: 1.4 g/dL (ref 0.4–1.8)
Globulin, Total: 3.1 g/dL (ref 2.2–3.9)
M-Spike, %: 0.8 g/dL — ABNORMAL HIGH
Total Protein ELP: 6.1 g/dL (ref 6.0–8.5)

## 2019-11-23 LAB — KAPPA/LAMBDA LIGHT CHAINS
Kappa free light chain: 33.2 mg/L — ABNORMAL HIGH (ref 3.3–19.4)
Kappa, lambda light chain ratio: 0.79 (ref 0.26–1.65)
Lambda free light chains: 42.2 mg/L — ABNORMAL HIGH (ref 5.7–26.3)

## 2019-11-23 MED ORDER — LANREOTIDE ACETATE 120 MG/0.5ML ~~LOC~~ SOLN
SUBCUTANEOUS | Status: AC
  Filled 2019-11-23: qty 120

## 2019-11-28 ENCOUNTER — Other Ambulatory Visit: Payer: Self-pay

## 2019-11-28 ENCOUNTER — Other Ambulatory Visit (HOSPITAL_COMMUNITY): Payer: Self-pay

## 2019-11-28 DIAGNOSIS — C9000 Multiple myeloma not having achieved remission: Secondary | ICD-10-CM

## 2019-11-29 ENCOUNTER — Encounter (HOSPITAL_COMMUNITY): Payer: Self-pay

## 2019-11-29 ENCOUNTER — Inpatient Hospital Stay (HOSPITAL_COMMUNITY): Payer: Medicare PPO

## 2019-11-29 VITALS — BP 130/70 | HR 69 | Temp 97.3°F | Resp 18 | Wt 120.4 lb

## 2019-11-29 DIAGNOSIS — C9000 Multiple myeloma not having achieved remission: Secondary | ICD-10-CM

## 2019-11-29 DIAGNOSIS — Z79899 Other long term (current) drug therapy: Secondary | ICD-10-CM | POA: Diagnosis not present

## 2019-11-29 DIAGNOSIS — E039 Hypothyroidism, unspecified: Secondary | ICD-10-CM | POA: Diagnosis not present

## 2019-11-29 DIAGNOSIS — E118 Type 2 diabetes mellitus with unspecified complications: Secondary | ICD-10-CM | POA: Diagnosis not present

## 2019-11-29 DIAGNOSIS — E876 Hypokalemia: Secondary | ICD-10-CM | POA: Diagnosis not present

## 2019-11-29 DIAGNOSIS — Z8572 Personal history of non-Hodgkin lymphomas: Secondary | ICD-10-CM | POA: Diagnosis not present

## 2019-11-29 DIAGNOSIS — I10 Essential (primary) hypertension: Secondary | ICD-10-CM | POA: Diagnosis not present

## 2019-11-29 DIAGNOSIS — Z5111 Encounter for antineoplastic chemotherapy: Secondary | ICD-10-CM | POA: Diagnosis not present

## 2019-11-29 DIAGNOSIS — R197 Diarrhea, unspecified: Secondary | ICD-10-CM | POA: Diagnosis not present

## 2019-11-29 LAB — CBC WITH DIFFERENTIAL/PLATELET
Abs Immature Granulocytes: 0.01 10*3/uL (ref 0.00–0.07)
Basophils Absolute: 0 10*3/uL (ref 0.0–0.1)
Basophils Relative: 1 %
Eosinophils Absolute: 0.1 10*3/uL (ref 0.0–0.5)
Eosinophils Relative: 3 %
HCT: 32.4 % — ABNORMAL LOW (ref 36.0–46.0)
Hemoglobin: 10.7 g/dL — ABNORMAL LOW (ref 12.0–15.0)
Immature Granulocytes: 0 %
Lymphocytes Relative: 23 %
Lymphs Abs: 0.9 10*3/uL (ref 0.7–4.0)
MCH: 32.3 pg (ref 26.0–34.0)
MCHC: 33 g/dL (ref 30.0–36.0)
MCV: 97.9 fL (ref 80.0–100.0)
Monocytes Absolute: 0.6 10*3/uL (ref 0.1–1.0)
Monocytes Relative: 16 %
Neutro Abs: 2.1 10*3/uL (ref 1.7–7.7)
Neutrophils Relative %: 57 %
Platelets: 98 10*3/uL — ABNORMAL LOW (ref 150–400)
RBC: 3.31 MIL/uL — ABNORMAL LOW (ref 3.87–5.11)
RDW: 18.3 % — ABNORMAL HIGH (ref 11.5–15.5)
WBC: 3.7 10*3/uL — ABNORMAL LOW (ref 4.0–10.5)
nRBC: 0 % (ref 0.0–0.2)

## 2019-11-29 LAB — COMPREHENSIVE METABOLIC PANEL
ALT: 21 U/L (ref 0–44)
AST: 33 U/L (ref 15–41)
Albumin: 3.4 g/dL — ABNORMAL LOW (ref 3.5–5.0)
Alkaline Phosphatase: 47 U/L (ref 38–126)
Anion gap: 8 (ref 5–15)
BUN: 18 mg/dL (ref 8–23)
CO2: 26 mmol/L (ref 22–32)
Calcium: 9 mg/dL (ref 8.9–10.3)
Chloride: 103 mmol/L (ref 98–111)
Creatinine, Ser: 1.19 mg/dL — ABNORMAL HIGH (ref 0.44–1.00)
GFR calc Af Amer: 52 mL/min — ABNORMAL LOW (ref >60)
GFR calc non Af Amer: 45 mL/min — ABNORMAL LOW (ref >60)
Glucose, Bld: 107 mg/dL — ABNORMAL HIGH (ref 70–99)
Potassium: 3.5 mmol/L (ref 3.5–5.1)
Sodium: 137 mmol/L (ref 135–145)
Total Bilirubin: 0.7 mg/dL (ref 0.3–1.2)
Total Protein: 7 g/dL (ref 6.5–8.1)

## 2019-11-29 MED ORDER — DEXAMETHASONE 4 MG PO TABS
10.0000 mg | ORAL_TABLET | Freq: Once | ORAL | Status: AC
  Administered 2019-11-29: 10 mg via ORAL

## 2019-11-29 MED ORDER — PROCHLORPERAZINE MALEATE 10 MG PO TABS: 10.0000 mg | ORAL_TABLET | Freq: Once | ORAL | Status: DC

## 2019-11-29 MED ORDER — BORTEZOMIB CHEMO SQ INJECTION 3.5 MG (2.5MG/ML)
1.3000 mg/m2 | Freq: Once | INTRAMUSCULAR | Status: AC
  Administered 2019-11-29: 2.25 mg via SUBCUTANEOUS
  Filled 2019-11-29: qty 0.9

## 2019-11-29 MED ORDER — DEXAMETHASONE 4 MG PO TABS
ORAL_TABLET | ORAL | Status: AC
  Filled 2019-11-29: qty 3

## 2019-11-29 NOTE — Progress Notes (Signed)
Patient presents today for Velcade injection only. Creat. 1.19, HGB 10.7, platelets 98.  Labs reviewed by RNester NP. Orders received okay to treat patient today.    Velcade given today per MD orders. Tolerated  without adverse affects. Vital signs stable. No complaints at this time. Discharged from clinic ambulatory. F/U with [facility] as scheduled.

## 2019-11-29 NOTE — Patient Instructions (Signed)
[facility] Discharge Instructions for Patients Receiving Chemotherapy  Today you received the following chemotherapy agents   To help prevent nausea and vomiting after your treatment, we encourage you to take your nausea medication   If you develop nausea and vomiting that is not controlled by your nausea medication, call the clinic.   BELOW ARE SYMPTOMS THAT SHOULD BE REPORTED IMMEDIATELY:  *FEVER GREATER THAN 100.5 F  *CHILLS WITH OR WITHOUT FEVER  NAUSEA AND VOMITING THAT IS NOT CONTROLLED WITH YOUR NAUSEA MEDICATION  *UNUSUAL SHORTNESS OF BREATH  *UNUSUAL BRUISING OR BLEEDING  TENDERNESS IN MOUTH AND THROAT WITH OR WITHOUT PRESENCE OF ULCERS  *URINARY PROBLEMS  *BOWEL PROBLEMS  UNUSUAL RASH Items with * indicate a potential emergency and should be followed up as soon as possible.  Feel free to call the clinic should you have any questions or concerns. The clinic phone number is [phone].  Please show the CHEMO ALERT CARD at check-in to the Emergency Department and triage nurse.   

## 2019-12-05 ENCOUNTER — Other Ambulatory Visit (HOSPITAL_COMMUNITY): Payer: Self-pay | Admitting: *Deleted

## 2019-12-05 DIAGNOSIS — C9000 Multiple myeloma not having achieved remission: Secondary | ICD-10-CM

## 2019-12-06 ENCOUNTER — Inpatient Hospital Stay (HOSPITAL_COMMUNITY): Payer: Medicare PPO

## 2019-12-06 ENCOUNTER — Encounter (HOSPITAL_COMMUNITY): Payer: Self-pay

## 2019-12-06 ENCOUNTER — Other Ambulatory Visit: Payer: Self-pay

## 2019-12-06 VITALS — BP 112/52 | HR 60 | Temp 96.8°F | Resp 18

## 2019-12-06 DIAGNOSIS — Z8572 Personal history of non-Hodgkin lymphomas: Secondary | ICD-10-CM | POA: Diagnosis not present

## 2019-12-06 DIAGNOSIS — C9000 Multiple myeloma not having achieved remission: Secondary | ICD-10-CM

## 2019-12-06 DIAGNOSIS — E118 Type 2 diabetes mellitus with unspecified complications: Secondary | ICD-10-CM | POA: Diagnosis not present

## 2019-12-06 DIAGNOSIS — Z5111 Encounter for antineoplastic chemotherapy: Secondary | ICD-10-CM | POA: Diagnosis not present

## 2019-12-06 DIAGNOSIS — E039 Hypothyroidism, unspecified: Secondary | ICD-10-CM | POA: Diagnosis not present

## 2019-12-06 DIAGNOSIS — Z79899 Other long term (current) drug therapy: Secondary | ICD-10-CM | POA: Diagnosis not present

## 2019-12-06 DIAGNOSIS — I10 Essential (primary) hypertension: Secondary | ICD-10-CM | POA: Diagnosis not present

## 2019-12-06 DIAGNOSIS — R197 Diarrhea, unspecified: Secondary | ICD-10-CM | POA: Diagnosis not present

## 2019-12-06 DIAGNOSIS — E876 Hypokalemia: Secondary | ICD-10-CM | POA: Diagnosis not present

## 2019-12-06 LAB — CBC WITH DIFFERENTIAL/PLATELET
Abs Immature Granulocytes: 0 10*3/uL (ref 0.00–0.07)
Basophils Absolute: 0 10*3/uL (ref 0.0–0.1)
Basophils Relative: 1 %
Eosinophils Absolute: 0.1 10*3/uL (ref 0.0–0.5)
Eosinophils Relative: 3 %
HCT: 33.4 % — ABNORMAL LOW (ref 36.0–46.0)
Hemoglobin: 10.7 g/dL — ABNORMAL LOW (ref 12.0–15.0)
Immature Granulocytes: 0 %
Lymphocytes Relative: 17 %
Lymphs Abs: 0.7 10*3/uL (ref 0.7–4.0)
MCH: 31.3 pg (ref 26.0–34.0)
MCHC: 32 g/dL (ref 30.0–36.0)
MCV: 97.7 fL (ref 80.0–100.0)
Monocytes Absolute: 0.3 10*3/uL (ref 0.1–1.0)
Monocytes Relative: 8 %
Neutro Abs: 2.9 10*3/uL (ref 1.7–7.7)
Neutrophils Relative %: 71 %
Platelets: 85 10*3/uL — ABNORMAL LOW (ref 150–400)
RBC: 3.42 MIL/uL — ABNORMAL LOW (ref 3.87–5.11)
RDW: 18.6 % — ABNORMAL HIGH (ref 11.5–15.5)
WBC: 4.1 10*3/uL (ref 4.0–10.5)
nRBC: 0 % (ref 0.0–0.2)

## 2019-12-06 LAB — COMPREHENSIVE METABOLIC PANEL
ALT: 24 U/L (ref 0–44)
AST: 32 U/L (ref 15–41)
Albumin: 3.3 g/dL — ABNORMAL LOW (ref 3.5–5.0)
Alkaline Phosphatase: 47 U/L (ref 38–126)
Anion gap: 8 (ref 5–15)
BUN: 23 mg/dL (ref 8–23)
CO2: 26 mmol/L (ref 22–32)
Calcium: 9 mg/dL (ref 8.9–10.3)
Chloride: 104 mmol/L (ref 98–111)
Creatinine, Ser: 1.23 mg/dL — ABNORMAL HIGH (ref 0.44–1.00)
GFR calc Af Amer: 50 mL/min — ABNORMAL LOW (ref >60)
GFR calc non Af Amer: 43 mL/min — ABNORMAL LOW (ref >60)
Glucose, Bld: 104 mg/dL — ABNORMAL HIGH (ref 70–99)
Potassium: 3.9 mmol/L (ref 3.5–5.1)
Sodium: 138 mmol/L (ref 135–145)
Total Bilirubin: 0.8 mg/dL (ref 0.3–1.2)
Total Protein: 6.7 g/dL (ref 6.5–8.1)

## 2019-12-06 MED ORDER — DEXAMETHASONE 4 MG PO TABS
ORAL_TABLET | ORAL | Status: AC
  Filled 2019-12-06: qty 3

## 2019-12-06 MED ORDER — BORTEZOMIB CHEMO SQ INJECTION 3.5 MG (2.5MG/ML)
1.3000 mg/m2 | Freq: Once | INTRAMUSCULAR | Status: AC
  Administered 2019-12-06: 2.25 mg via SUBCUTANEOUS
  Filled 2019-12-06: qty 0.9

## 2019-12-06 MED ORDER — DEXAMETHASONE 4 MG PO TABS
10.0000 mg | ORAL_TABLET | Freq: Once | ORAL | Status: AC
  Administered 2019-12-06: 10 mg via ORAL

## 2019-12-06 NOTE — Progress Notes (Signed)
Labs reviewed by [facility] NP. Message received to proceed with treatment. Patient has no complaints of any changes since the last visit. Patient has complaints of her toe being sore from an ingrown toe nail. MAR reviewed and updated.   Treatment given today per MD orders. Tolerated without adverse affects. Vital signs stable. No complaints at this time. Discharged from clinic ambulatory. F/U with [facility] as scheduled.

## 2019-12-06 NOTE — Patient Instructions (Signed)
[facility] Discharge Instructions for Patients Receiving Chemotherapy  Today you received the following chemotherapy agents   To help prevent nausea and vomiting after your treatment, we encourage you to take your nausea medication   If you develop nausea and vomiting that is not controlled by your nausea medication, call the clinic.   BELOW ARE SYMPTOMS THAT SHOULD BE REPORTED IMMEDIATELY:  *FEVER GREATER THAN 100.5 F  *CHILLS WITH OR WITHOUT FEVER  NAUSEA AND VOMITING THAT IS NOT CONTROLLED WITH YOUR NAUSEA MEDICATION  *UNUSUAL SHORTNESS OF BREATH  *UNUSUAL BRUISING OR BLEEDING  TENDERNESS IN MOUTH AND THROAT WITH OR WITHOUT PRESENCE OF ULCERS  *URINARY PROBLEMS  *BOWEL PROBLEMS  UNUSUAL RASH Items with * indicate a potential emergency and should be followed up as soon as possible.  Feel free to call the clinic should you have any questions or concerns. The clinic phone number is [phone].  Please show the CHEMO ALERT CARD at check-in to the Emergency Department and triage nurse.   

## 2019-12-18 ENCOUNTER — Other Ambulatory Visit (HOSPITAL_COMMUNITY): Payer: Self-pay | Admitting: Nurse Practitioner

## 2019-12-20 ENCOUNTER — Encounter (HOSPITAL_COMMUNITY): Payer: Self-pay

## 2019-12-20 ENCOUNTER — Other Ambulatory Visit: Payer: Self-pay

## 2019-12-20 ENCOUNTER — Inpatient Hospital Stay (HOSPITAL_COMMUNITY): Payer: Medicare PPO | Attending: Hematology

## 2019-12-20 ENCOUNTER — Other Ambulatory Visit (HOSPITAL_COMMUNITY): Payer: Self-pay | Admitting: Nurse Practitioner

## 2019-12-20 ENCOUNTER — Inpatient Hospital Stay (HOSPITAL_COMMUNITY): Payer: Medicare PPO

## 2019-12-20 VITALS — BP 112/61 | HR 52 | Temp 97.6°F | Wt 119.4 lb

## 2019-12-20 DIAGNOSIS — C9000 Multiple myeloma not having achieved remission: Secondary | ICD-10-CM

## 2019-12-20 DIAGNOSIS — Z5111 Encounter for antineoplastic chemotherapy: Secondary | ICD-10-CM | POA: Insufficient documentation

## 2019-12-20 LAB — CBC WITH DIFFERENTIAL/PLATELET
Abs Immature Granulocytes: 0.01 10*3/uL (ref 0.00–0.07)
Basophils Absolute: 0 10*3/uL (ref 0.0–0.1)
Basophils Relative: 1 %
Eosinophils Absolute: 0.2 10*3/uL (ref 0.0–0.5)
Eosinophils Relative: 5 %
HCT: 30.6 % — ABNORMAL LOW (ref 36.0–46.0)
Hemoglobin: 9.8 g/dL — ABNORMAL LOW (ref 12.0–15.0)
Immature Granulocytes: 0 %
Lymphocytes Relative: 18 %
Lymphs Abs: 0.8 10*3/uL (ref 0.7–4.0)
MCH: 31.6 pg (ref 26.0–34.0)
MCHC: 32 g/dL (ref 30.0–36.0)
MCV: 98.7 fL (ref 80.0–100.0)
Monocytes Absolute: 0.6 10*3/uL (ref 0.1–1.0)
Monocytes Relative: 13 %
Neutro Abs: 2.9 10*3/uL (ref 1.7–7.7)
Neutrophils Relative %: 63 %
Platelets: 116 10*3/uL — ABNORMAL LOW (ref 150–400)
RBC: 3.1 MIL/uL — ABNORMAL LOW (ref 3.87–5.11)
RDW: 17.9 % — ABNORMAL HIGH (ref 11.5–15.5)
WBC: 4.6 10*3/uL (ref 4.0–10.5)
nRBC: 0 % (ref 0.0–0.2)

## 2019-12-20 LAB — COMPREHENSIVE METABOLIC PANEL
ALT: 23 U/L (ref 0–44)
AST: 28 U/L (ref 15–41)
Albumin: 3.2 g/dL — ABNORMAL LOW (ref 3.5–5.0)
Alkaline Phosphatase: 50 U/L (ref 38–126)
Anion gap: 6 (ref 5–15)
BUN: 20 mg/dL (ref 8–23)
CO2: 26 mmol/L (ref 22–32)
Calcium: 8.9 mg/dL (ref 8.9–10.3)
Chloride: 107 mmol/L (ref 98–111)
Creatinine, Ser: 1.38 mg/dL — ABNORMAL HIGH (ref 0.44–1.00)
GFR calc Af Amer: 43 mL/min — ABNORMAL LOW (ref >60)
GFR calc non Af Amer: 37 mL/min — ABNORMAL LOW (ref >60)
Glucose, Bld: 102 mg/dL — ABNORMAL HIGH (ref 70–99)
Potassium: 3.8 mmol/L (ref 3.5–5.1)
Sodium: 139 mmol/L (ref 135–145)
Total Bilirubin: 0.6 mg/dL (ref 0.3–1.2)
Total Protein: 6.5 g/dL (ref 6.5–8.1)

## 2019-12-20 LAB — LACTATE DEHYDROGENASE: LDH: 158 U/L (ref 98–192)

## 2019-12-20 MED ORDER — BORTEZOMIB CHEMO SQ INJECTION 3.5 MG (2.5MG/ML)
1.3000 mg/m2 | Freq: Once | INTRAMUSCULAR | Status: AC
  Administered 2019-12-20: 2.25 mg via SUBCUTANEOUS
  Filled 2019-12-20: qty 0.9

## 2019-12-20 MED ORDER — DEXAMETHASONE 4 MG PO TABS
10.0000 mg | ORAL_TABLET | Freq: Once | ORAL | Status: AC
  Administered 2019-12-20: 16:00:00 10 mg via ORAL
  Filled 2019-12-20: qty 3

## 2019-12-20 NOTE — Telephone Encounter (Signed)
Per Francene Finders, NP, okay to refill revlimid

## 2019-12-20 NOTE — Progress Notes (Signed)
Labs reviewed with Dr. Katragadda.  Okay to proceed with Velcade injection per MD.  

## 2019-12-21 LAB — KAPPA/LAMBDA LIGHT CHAINS
Kappa free light chain: 42.2 mg/L — ABNORMAL HIGH (ref 3.3–19.4)
Kappa, lambda light chain ratio: 0.96 (ref 0.26–1.65)
Lambda free light chains: 43.9 mg/L — ABNORMAL HIGH (ref 5.7–26.3)

## 2019-12-21 LAB — PROTEIN ELECTROPHORESIS, SERUM
A/G Ratio: 1 (ref 0.7–1.7)
Albumin ELP: 2.9 g/dL (ref 2.9–4.4)
Alpha-1-Globulin: 0.2 g/dL (ref 0.0–0.4)
Alpha-2-Globulin: 0.7 g/dL (ref 0.4–1.0)
Beta Globulin: 0.7 g/dL (ref 0.7–1.3)
Gamma Globulin: 1.4 g/dL (ref 0.4–1.8)
Globulin, Total: 3 g/dL (ref 2.2–3.9)
M-Spike, %: 0.8 g/dL — ABNORMAL HIGH
Total Protein ELP: 5.9 g/dL — ABNORMAL LOW (ref 6.0–8.5)

## 2019-12-26 ENCOUNTER — Other Ambulatory Visit: Payer: Self-pay

## 2019-12-27 ENCOUNTER — Inpatient Hospital Stay (HOSPITAL_COMMUNITY): Payer: Medicare PPO

## 2019-12-27 ENCOUNTER — Inpatient Hospital Stay (HOSPITAL_COMMUNITY): Payer: Medicare PPO | Admitting: Hematology

## 2019-12-27 ENCOUNTER — Encounter (HOSPITAL_COMMUNITY): Payer: Self-pay | Admitting: Hematology

## 2019-12-27 DIAGNOSIS — C9000 Multiple myeloma not having achieved remission: Secondary | ICD-10-CM | POA: Diagnosis not present

## 2019-12-27 DIAGNOSIS — Z7189 Other specified counseling: Secondary | ICD-10-CM | POA: Diagnosis not present

## 2019-12-27 DIAGNOSIS — Z5111 Encounter for antineoplastic chemotherapy: Secondary | ICD-10-CM | POA: Diagnosis not present

## 2019-12-27 LAB — CBC WITH DIFFERENTIAL/PLATELET
Abs Immature Granulocytes: 0 10*3/uL (ref 0.00–0.07)
Basophils Absolute: 0 10*3/uL (ref 0.0–0.1)
Basophils Relative: 1 %
Eosinophils Absolute: 0.1 10*3/uL (ref 0.0–0.5)
Eosinophils Relative: 4 %
HCT: 32.1 % — ABNORMAL LOW (ref 36.0–46.0)
Hemoglobin: 10.1 g/dL — ABNORMAL LOW (ref 12.0–15.0)
Immature Granulocytes: 0 %
Lymphocytes Relative: 21 %
Lymphs Abs: 0.6 10*3/uL — ABNORMAL LOW (ref 0.7–4.0)
MCH: 31.2 pg (ref 26.0–34.0)
MCHC: 31.5 g/dL (ref 30.0–36.0)
MCV: 99.1 fL (ref 80.0–100.0)
Monocytes Absolute: 0.5 10*3/uL (ref 0.1–1.0)
Monocytes Relative: 17 %
Neutro Abs: 1.8 10*3/uL (ref 1.7–7.7)
Neutrophils Relative %: 57 %
Platelets: 79 10*3/uL — ABNORMAL LOW (ref 150–400)
RBC: 3.24 MIL/uL — ABNORMAL LOW (ref 3.87–5.11)
RDW: 18.1 % — ABNORMAL HIGH (ref 11.5–15.5)
WBC: 3.1 10*3/uL — ABNORMAL LOW (ref 4.0–10.5)
nRBC: 0 % (ref 0.0–0.2)

## 2019-12-27 LAB — COMPREHENSIVE METABOLIC PANEL
ALT: 24 U/L (ref 0–44)
AST: 31 U/L (ref 15–41)
Albumin: 3.2 g/dL — ABNORMAL LOW (ref 3.5–5.0)
Alkaline Phosphatase: 47 U/L (ref 38–126)
Anion gap: 6 (ref 5–15)
BUN: 20 mg/dL (ref 8–23)
CO2: 26 mmol/L (ref 22–32)
Calcium: 9 mg/dL (ref 8.9–10.3)
Chloride: 108 mmol/L (ref 98–111)
Creatinine, Ser: 1.18 mg/dL — ABNORMAL HIGH (ref 0.44–1.00)
GFR calc Af Amer: 52 mL/min — ABNORMAL LOW (ref >60)
GFR calc non Af Amer: 45 mL/min — ABNORMAL LOW (ref >60)
Glucose, Bld: 133 mg/dL — ABNORMAL HIGH (ref 70–99)
Potassium: 3.9 mmol/L (ref 3.5–5.1)
Sodium: 140 mmol/L (ref 135–145)
Total Bilirubin: 0.7 mg/dL (ref 0.3–1.2)
Total Protein: 6.6 g/dL (ref 6.5–8.1)

## 2019-12-27 LAB — LACTATE DEHYDROGENASE: LDH: 155 U/L (ref 98–192)

## 2019-12-27 MED ORDER — BORTEZOMIB CHEMO SQ INJECTION 3.5 MG (2.5MG/ML)
1.3000 mg/m2 | Freq: Once | INTRAMUSCULAR | Status: AC
  Administered 2019-12-27: 2.25 mg via SUBCUTANEOUS
  Filled 2019-12-27: qty 0.9

## 2019-12-27 MED ORDER — DEXAMETHASONE 4 MG PO TABS
10.0000 mg | ORAL_TABLET | Freq: Once | ORAL | Status: AC
  Administered 2019-12-27: 10 mg via ORAL
  Filled 2019-12-27: qty 3

## 2019-12-27 NOTE — Patient Instructions (Addendum)
[place] at [facility] Discharge Instructions  You were seen today by Dr. Delton Coombes. He went over your recent lab results. Your myeloma numbers are stable He will see you back in 4 weeks for labs and follow up.  STOP taking the amlodipine.  Thank you for choosing [place] at [facility] to provide your oncology and hematology care.  To afford each patient quality time with our provider, please arrive at least 15 minutes before your scheduled appointment time.   If you have a lab appointment with the [facility] please come in thru the  Main Entrance and check in at the main information desk  You need to re-schedule your appointment should you arrive 10 or more minutes late.  We strive to give you quality time with our providers, and arriving late affects you and other patients whose appointments are after yours.  Also, if you no show three or more times for appointments you may be dismissed from the clinic at the providers discretion.     Again, thank you for choosing [facility].  Our hope is that these requests will decrease the amount of time that you wait before being seen by our physicians.       _____________________________________________________________  Should you have questions after your visit to [facility], please contact our office at [phone] between the hours of 8:00 a.m. and 4:30 p.m.  Voicemails left after 4:00 p.m. will not be returned until the following business day.  For prescription refill requests, have your pharmacy contact our office and allow 72 hours.    Cancer Center Support Programs:   > Cancer Support Group  2nd Tuesday of the month 1pm-2pm, Journey Room

## 2019-12-27 NOTE — Progress Notes (Signed)
Labs reviewed with MD today. Proceed with treatment per MD. Platelets noted 79,000.   Treatment given per orders. Patient tolerated it well without problems. Vitals stable and discharged home from clinic ambulatory. Follow up as scheduled.

## 2019-12-27 NOTE — Progress Notes (Signed)
[place], [place] [address]   CLINIC:  Medical Oncology/Hematology  PCP:  Monico Blitz, MD [place] [address] [phone]   REASON FOR VISIT:  Follow-up for Multiple Myeloma   CURRENT THERAPY: Rev/Velcade/Dex  BRIEF ONCOLOGIC HISTORY:  Oncology History  Multiple myeloma ([place])  07/09/2017 Initial Diagnosis   Multiple myeloma ([place])   06/01/2018 -  Chemotherapy   The patient had bortezomib SQ (VELCADE) chemo injection 1.75 mg, 1 mg/m2 = 1.75 mg (100 % of original dose 1 mg/m2), Subcutaneous,  Once, 21 of 24 cycles Dose modification: 1 mg/m2 (original dose 1 mg/m2, Cycle 1, Reason: Provider Judgment), 1.3 mg/m2 (original dose 1 mg/m2, Cycle 8, Reason: Provider Judgment) Administration: 1.75 mg (06/01/2018), 1.75 mg (06/08/2018), 1.75 mg (06/15/2018), 1.75 mg (06/29/2018), 1.75 mg (07/06/2018), 1.75 mg (07/13/2018), 1.75 mg (07/27/2018), 1.75 mg (08/03/2018), 1.75 mg (08/10/2018), 1.75 mg (08/24/2018), 1.75 mg (08/31/2018), 1.75 mg (09/07/2018), 1.75 mg (09/21/2018), 1.75 mg (09/28/2018), 1.75 mg (10/05/2018), 1.75 mg (10/19/2018), 1.75 mg (10/26/2018), 1.75 mg (11/02/2018), 1.75 mg (11/16/2018), 1.75 mg (11/23/2018), 1.75 mg (11/30/2018), 2.25 mg (12/17/2018), 2.25 mg (12/24/2018), 2.25 mg (01/04/2019), 2.25 mg (01/18/2019), 2.25 mg (01/25/2019), 2.25 mg (02/01/2019), 2.25 mg (02/15/2019), 2.25 mg (02/22/2019), 2.25 mg (03/01/2019), 2.25 mg (03/15/2019), 2.25 mg (03/23/2019), 2.25 mg (03/31/2019), 2.25 mg (04/12/2019), 2.25 mg (04/19/2019), 2.25 mg (04/26/2019), 2.25 mg (05/10/2019), 2.25 mg (05/17/2019), 2.25 mg (05/24/2019), 2.25 mg (06/07/2019), 2.25 mg (06/14/2019), 2.25 mg (06/21/2019), 2.25 mg (07/05/2019), 2.25 mg (07/12/2019), 2.25 mg (07/19/2019), 2.25 mg (08/02/2019), 2.25 mg (08/09/2019), 2.25 mg (08/16/2019), 2.25 mg (08/30/2019), 2.25 mg (09/06/2019), 2.25 mg (09/13/2019), 2.25 mg (09/27/2019), 2.25 mg (10/04/2019), 2.25 mg (10/11/2019), 2.25 mg (10/25/2019), 2.25 mg (11/08/2019), 2.25  mg (11/22/2019), 2.25 mg (11/29/2019), 2.25 mg (12/06/2019), 2.25 mg (12/20/2019), 2.25 mg (12/27/2019), 2.25 mg (01/03/2020)  for chemotherapy treatment.        INTERVAL HISTORY:  Amanda Wu 74 y.o. female seen for follow-up of multiple myeloma. Appetite and energy levels are 100%. No new pains reported. Denies any skin rashes. No numbness or tingling reported. Denies nausea, vomiting. Diarrhea improved after NG was stopped.  REVIEW OF SYSTEMS:  Review of Systems  All other systems reviewed and are negative.    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[place])    Diabetes mellitus without complication ([place])    H/O partial nephrectomy    Hypokalemia    Hypothyroidism    Impaired cognition    Multiple myeloma (HCC)    Dr Maylon Peppers, [facility]   Thyroid disease    Past Surgical History:  Procedure Laterality Date   ABDOMINAL HYSTERECTOMY     CRANIOTOMY     for lymphoma   YAG LASER APPLICATION Right 5/36/4680   Procedure: YAG LASER APPLICATION;  Surgeon: [name], MD;  Location: [facility];  Service: Ophthalmology;  Laterality: Right;     SOCIAL HISTORY:  Social History   Socioeconomic History   Marital status: Single    Spouse name: Not on file   Number of children: Not on file   Years of education: Not on file   Highest education level: Not on file  Occupational History   Not on file  Tobacco Use   Smoking status: Never Smoker   Smokeless tobacco: Never Used  Substance and Sexual Activity   Alcohol use: No   Drug use: No  Sexual activity: Not on file  Other Topics Concern   Not on file  Social History Narrative   Not on file   Social Determinants of Health   Financial Resource Strain:    Difficulty of Paying Living Expenses: Not on file  Food Insecurity:    Worried About [place] in the Last Year: Not on file   Ran Out of  Food in the Last Year: Not on file  Transportation Needs:    Lack of Transportation (Medical): Not on file   Lack of Transportation (Non-Medical): Not on file  Physical Activity:    Days of Exercise per Week: Not on file   Minutes of Exercise per Session: Not on file  Stress:    Feeling of Stress : Not on file  Social Connections:    Frequency of Communication with Friends and Family: Not on file   Frequency of Social Gatherings with Friends and Family: Not on file   Attends Religious Services: Not on file   Active Member of Clubs or Organizations: Not on file   Attends Archivist Meetings: Not on file   Marital Status: Not on file  Intimate Partner Violence:    Fear of Current or Ex-Partner: Not on file   Emotionally Abused: Not on file   Physically Abused: Not on file   Sexually Abused: Not on file    FAMILY HISTORY:  Family History  Problem Relation Age of Onset   Depression Mother    Diabetes Mother    Heart disease Father    Cancer - Prostate Brother    Cancer Brother    Cancer Sister     CURRENT MEDICATIONS:  Outpatient Encounter Medications as of 12/27/2019  Medication Sig Note   ACCU-CHEK SMARTVIEW test strip     Accu-Chek Softclix Lancets lancets     amLODipine (NORVASC) 5 MG tablet Take 5 mg by mouth daily.     aspirin EC 81 MG tablet Take 81 mg by mouth daily.    bortezomib IV (VELCADE) 3.5 MG injection Inject into the vein.    calcium-vitamin D (OSCAL WITH D) 500-200 MG-UNIT tablet Take 1 tablet by mouth daily with breakfast.    dexamethasone (DECADRON) 4 MG tablet TAKE TWO AND ONE-HALF TABLETS BY MOUTH WEEKLY FOR THREE WEEKS (THREE WEEKS ON, ONE WEEK OFF) IN A ROW AND REPEAT MONTHLY 06/22/2018: Patient states that she is given the dexamethasone while here getting her velcade injection   donepezil (ARICEPT) 10 MG tablet TAKE 1 TABLET BY MOUTH every evening    ferrous sulfate 325 (65 FE) MG tablet Take 325 mg by mouth daily  with breakfast.    lenalidomide (REVLIMID) 10 MG capsule TAKE 1 CAPSULE BY MOUTH  DAILY FOR 14 DAYS, THEN 14  DAYS OFF    levothyroxine (SYNTHROID, LEVOTHROID) 25 MCG tablet Take 25 mcg by mouth daily before breakfast.    losartan (COZAAR) 100 MG tablet Take 100 mg by mouth daily.    Multiple Vitamins-Minerals (MULTIVITAMIN WOMEN 50+ PO) Take by mouth.    potassium chloride (KLOR-CON) 10 MEQ tablet TAKE THREE TABLETS BY MOUTH EVERY DAY    [DISCONTINUED] acyclovir (ZOVIRAX) 400 MG tablet Take 1 tablet (400 mg total) by mouth 2 (two) times daily.    acetaminophen (TYLENOL) 500 MG tablet Take 1,000 mg by mouth every 6 (six) hours as needed for mild pain.     Facility-Administered Encounter Medications as of 12/27/2019  Medication   prochlorperazine (COMPAZINE) tablet 10 mg  ALLERGIES:  Allergies  Allergen Reactions   Lotensin [Benazepril Hcl]     Facial swelling (Angioedema) Note: ARBs should also be contraindicated, but she is on Losartan!     PHYSICAL EXAM:  ECOG Performance status: 1  Vitals:   12/27/19 1128  BP: (!) 88/74  Pulse: (!) 50  Resp: 18  Temp: (!) 97 F (36.1 C)  SpO2: 100%   Filed Weights   12/27/19 1128  Weight: 117 lb 12.8 oz (53.4 kg)    Physical Exam Vitals reviewed.  Constitutional:      Appearance: Normal appearance.  HENT:     Head: Normocephalic.     Nose: Nose normal.     Mouth/Throat:     Mouth: Mucous membranes are moist.     Pharynx: Oropharynx is clear.  Eyes:     Extraocular Movements: Extraocular movements intact.     Conjunctiva/sclera: Conjunctivae normal.  Cardiovascular:     Rate and Rhythm: Normal rate and regular rhythm.     Pulses: Normal pulses.     Heart sounds: Normal heart sounds.  Pulmonary:     Effort: Pulmonary effort is normal.     Breath sounds: Normal breath sounds.  Abdominal:     General: Bowel sounds are normal.     Palpations: Abdomen is soft.  Musculoskeletal:        General: Normal range of  motion.     Cervical back: Normal range of motion.  Skin:    General: Skin is warm and dry.  Neurological:     Mental Status: She is alert. Mental status is at baseline.  Psychiatric:        Mood and Affect: Mood normal.        Behavior: Behavior normal.      LABORATORY DATA:  I have reviewed the labs as listed.  CBC    Component Value Date/Time   WBC 5.0 01/03/2020 1021   RBC 3.32 (L) 01/03/2020 1021   HGB 10.6 (L) 01/03/2020 1021   HCT 32.6 (L) 01/03/2020 1021   PLT 88 (L) 01/03/2020 1021   MCV 98.2 01/03/2020 1021   MCH 31.9 01/03/2020 1021   MCHC 32.5 01/03/2020 1021   RDW 18.8 (H) 01/03/2020 1021   LYMPHSABS 0.6 (L) 01/03/2020 1021   MONOABS 0.3 01/03/2020 1021   EOSABS 0.1 01/03/2020 1021   BASOSABS 0.0 01/03/2020 1021   CMP Latest Ref Rng & Units 01/03/2020 12/27/2019 12/20/2019  Glucose 70 - 99 mg/dL 124(H) 133(H) 102(H)  BUN 8 - 23 mg/dL 26(H) 20 20  Creatinine 0.44 - 1.00 mg/dL 1.34(H) 1.18(H) 1.38(H)  Sodium 135 - 145 mmol/L 141 140 139  Potassium 3.5 - 5.1 mmol/L 4.3 3.9 3.8  Chloride 98 - 111 mmol/L 107 108 107  CO2 22 - 32 mmol/L '27 26 26  ' Calcium 8.9 - 10.3 mg/dL 9.2 9.0 8.9  Total Protein 6.5 - 8.1 g/dL 6.7 6.6 6.5  Total Bilirubin 0.3 - 1.2 mg/dL 0.8 0.7 0.6  Alkaline Phos 38 - 126 U/L 47 47 50  AST 15 - 41 U/L '31 31 28  ' ALT 0 - 44 U/L '24 24 23     ' I have independently reviewed scans and discussed with the patient.     ASSESSMENT & PLAN:   Multiple myeloma (HCC) 1.  IgG lambda multiple myeloma: - Revlimid 25 mg and dexamethasone 3 weeks on 1 week off from September 2009 through November 2017. - Velcade 1 mg per metered squared 3 weeks on 1 week  off started on 06/01/2018, dose increased to 1.3 mg/M squared on 12/16/2018. -Revlimid 10 mg 2 weeks on 2 weeks off started on 08/14/2018 for better response.  She also takes dexamethasone 10 mg on days of Velcade. -She is tolerating Velcade 3 weeks on 1 week off very well. She is also taking Revlimid 2  weeks on 2 weeks off. -We reviewed myeloma panel from 12/20/2019. M spike is 0.8 and improved from last time. Free light chain ratio is 0.96. Kappa light chains are 42.2 and lambda light chains of 43.9. -Creatinine is stable at 1.18. Diarrhea improved after Ensure was stopped. -She will be seen back in 4 weeks for follow-up.  2.  Bone strengthening: - She received Zometa monthly starting September 2009, has been off of it for several years.  3.  CNS MALT like lymphoma: - Treated with chemotherapy and radiation, last brain MRI in 2013, CT of the brain in 2018 with no evidence of recurrence. -She continues Aricept 10 mg daily for dementia.  4.  Shingles prophylaxis: -She will continue acyclovir 400 mg twice daily.  5. Hypertension: -Blood pressure today is 88 systolic. I told her to discontinue Norvasc. She'll continue losartan.       Orders placed this encounter:  No orders of the defined types were placed in this encounter.     Derek Jack, MD [place] [phone]

## 2019-12-28 LAB — KAPPA/LAMBDA LIGHT CHAINS
Kappa free light chain: 20.1 mg/L — ABNORMAL HIGH (ref 3.3–19.4)
Kappa, lambda light chain ratio: 0.44 (ref 0.26–1.65)
Lambda free light chains: 45.2 mg/L — ABNORMAL HIGH (ref 5.7–26.3)

## 2019-12-28 LAB — PROTEIN ELECTROPHORESIS, SERUM
A/G Ratio: 1.2 (ref 0.7–1.7)
Albumin ELP: 3.3 g/dL (ref 2.9–4.4)
Alpha-1-Globulin: 0.2 g/dL (ref 0.0–0.4)
Alpha-2-Globulin: 0.7 g/dL (ref 0.4–1.0)
Beta Globulin: 0.7 g/dL (ref 0.7–1.3)
Gamma Globulin: 1.2 g/dL (ref 0.4–1.8)
Globulin, Total: 2.8 g/dL (ref 2.2–3.9)
M-Spike, %: 0.6 g/dL — ABNORMAL HIGH
Total Protein ELP: 6.1 g/dL (ref 6.0–8.5)

## 2020-01-03 ENCOUNTER — Other Ambulatory Visit: Payer: Self-pay

## 2020-01-03 ENCOUNTER — Encounter (HOSPITAL_COMMUNITY): Payer: Self-pay

## 2020-01-03 ENCOUNTER — Inpatient Hospital Stay (HOSPITAL_COMMUNITY): Payer: Medicare PPO

## 2020-01-03 VITALS — BP 98/44 | HR 63 | Temp 97.7°F | Resp 18 | Wt 115.0 lb

## 2020-01-03 DIAGNOSIS — Z5111 Encounter for antineoplastic chemotherapy: Secondary | ICD-10-CM | POA: Diagnosis not present

## 2020-01-03 DIAGNOSIS — C9000 Multiple myeloma not having achieved remission: Secondary | ICD-10-CM

## 2020-01-03 LAB — COMPREHENSIVE METABOLIC PANEL
ALT: 24 U/L (ref 0–44)
AST: 31 U/L (ref 15–41)
Albumin: 3.3 g/dL — ABNORMAL LOW (ref 3.5–5.0)
Alkaline Phosphatase: 47 U/L (ref 38–126)
Anion gap: 7 (ref 5–15)
BUN: 26 mg/dL — ABNORMAL HIGH (ref 8–23)
CO2: 27 mmol/L (ref 22–32)
Calcium: 9.2 mg/dL (ref 8.9–10.3)
Chloride: 107 mmol/L (ref 98–111)
Creatinine, Ser: 1.34 mg/dL — ABNORMAL HIGH (ref 0.44–1.00)
GFR calc Af Amer: 45 mL/min — ABNORMAL LOW (ref >60)
GFR calc non Af Amer: 39 mL/min — ABNORMAL LOW (ref >60)
Glucose, Bld: 124 mg/dL — ABNORMAL HIGH (ref 70–99)
Potassium: 4.3 mmol/L (ref 3.5–5.1)
Sodium: 141 mmol/L (ref 135–145)
Total Bilirubin: 0.8 mg/dL (ref 0.3–1.2)
Total Protein: 6.7 g/dL (ref 6.5–8.1)

## 2020-01-03 LAB — CBC WITH DIFFERENTIAL/PLATELET
Abs Immature Granulocytes: 0.01 10*3/uL (ref 0.00–0.07)
Basophils Absolute: 0 10*3/uL (ref 0.0–0.1)
Basophils Relative: 1 %
Eosinophils Absolute: 0.1 10*3/uL (ref 0.0–0.5)
Eosinophils Relative: 3 %
HCT: 32.6 % — ABNORMAL LOW (ref 36.0–46.0)
Hemoglobin: 10.6 g/dL — ABNORMAL LOW (ref 12.0–15.0)
Immature Granulocytes: 0 %
Lymphocytes Relative: 12 %
Lymphs Abs: 0.6 10*3/uL — ABNORMAL LOW (ref 0.7–4.0)
MCH: 31.9 pg (ref 26.0–34.0)
MCHC: 32.5 g/dL (ref 30.0–36.0)
MCV: 98.2 fL (ref 80.0–100.0)
Monocytes Absolute: 0.3 10*3/uL (ref 0.1–1.0)
Monocytes Relative: 7 %
Neutro Abs: 3.9 10*3/uL (ref 1.7–7.7)
Neutrophils Relative %: 77 %
Platelets: 88 10*3/uL — ABNORMAL LOW (ref 150–400)
RBC: 3.32 MIL/uL — ABNORMAL LOW (ref 3.87–5.11)
RDW: 18.8 % — ABNORMAL HIGH (ref 11.5–15.5)
WBC: 5 10*3/uL (ref 4.0–10.5)
nRBC: 0 % (ref 0.0–0.2)

## 2020-01-03 MED ORDER — DEXAMETHASONE 4 MG PO TABS
10.0000 mg | ORAL_TABLET | Freq: Once | ORAL | Status: AC
  Administered 2020-01-03: 10 mg via ORAL
  Filled 2020-01-03: qty 3

## 2020-01-03 MED ORDER — BORTEZOMIB CHEMO SQ INJECTION 3.5 MG (2.5MG/ML)
1.3000 mg/m2 | Freq: Once | INTRAMUSCULAR | Status: AC
  Administered 2020-01-03: 12:00:00 2.25 mg via SUBCUTANEOUS
  Filled 2020-01-03: qty 0.9

## 2020-01-03 NOTE — Progress Notes (Signed)
Serum creatinine 1.34 platelets 88  today.  Reviewed labs with verbal ok to treat Dr. Delton Coombes.   Patient tolerated Velcade injection with no complaints voiced.  Site clean and dry with no bruising or swelling noted at site.  Band aid applied.  Vss with discharge and left ambulatory with no s/s of distress noted.

## 2020-01-04 ENCOUNTER — Other Ambulatory Visit (HOSPITAL_COMMUNITY): Payer: Self-pay | Admitting: Hematology

## 2020-01-07 DIAGNOSIS — Z0000 Encounter for general adult medical examination without abnormal findings: Secondary | ICD-10-CM | POA: Insufficient documentation

## 2020-01-07 DIAGNOSIS — Z7189 Other specified counseling: Secondary | ICD-10-CM | POA: Insufficient documentation

## 2020-01-07 NOTE — Assessment & Plan Note (Signed)
1.  IgG lambda multiple myeloma: - Revlimid 25 mg and dexamethasone 3 weeks on 1 week off from September 2009 through November 2017. - Velcade 1 mg per metered squared 3 weeks on 1 week off started on 06/01/2018, dose increased to 1.3 mg/M squared on 12/16/2018. -Revlimid 10 mg 2 weeks on 2 weeks off started on 08/14/2018 for better response.  She also takes dexamethasone 10 mg on days of Velcade. -She is tolerating Velcade 3 weeks on 1 week off very well. She is also taking Revlimid 2 weeks on 2 weeks off. -We reviewed myeloma panel from 12/20/2019. M spike is 0.8 and improved from last time. Free light chain ratio is 0.96. Kappa light chains are 42.2 and lambda light chains of 43.9. -Creatinine is stable at 1.18. Diarrhea improved after Ensure was stopped. -She will be seen back in 4 weeks for follow-up.  2.  Bone strengthening: - She received Zometa monthly starting September 2009, has been off of it for several years.  3.  CNS MALT like lymphoma: - Treated with chemotherapy and radiation, last brain MRI in 2013, CT of the brain in 2018 with no evidence of recurrence. -She continues Aricept 10 mg daily for dementia.  4.  Shingles prophylaxis: -She will continue acyclovir 400 mg twice daily.  5. Hypertension: -Blood pressure today is 88 systolic. I told her to discontinue Norvasc. She'll continue losartan.

## 2020-01-17 ENCOUNTER — Other Ambulatory Visit: Payer: Self-pay

## 2020-01-17 ENCOUNTER — Encounter (HOSPITAL_COMMUNITY): Payer: Self-pay

## 2020-01-17 ENCOUNTER — Inpatient Hospital Stay (HOSPITAL_COMMUNITY): Payer: Medicare PPO | Attending: Hematology

## 2020-01-17 ENCOUNTER — Inpatient Hospital Stay (HOSPITAL_COMMUNITY): Payer: Medicare PPO

## 2020-01-17 VITALS — BP 128/58 | HR 50 | Temp 97.2°F | Resp 18 | Wt 118.4 lb

## 2020-01-17 DIAGNOSIS — Z79899 Other long term (current) drug therapy: Secondary | ICD-10-CM | POA: Diagnosis not present

## 2020-01-17 DIAGNOSIS — Z905 Acquired absence of kidney: Secondary | ICD-10-CM | POA: Diagnosis not present

## 2020-01-17 DIAGNOSIS — I129 Hypertensive chronic kidney disease with stage 1 through stage 4 chronic kidney disease, or unspecified chronic kidney disease: Secondary | ICD-10-CM | POA: Diagnosis not present

## 2020-01-17 DIAGNOSIS — N182 Chronic kidney disease, stage 2 (mild): Secondary | ICD-10-CM | POA: Insufficient documentation

## 2020-01-17 DIAGNOSIS — D696 Thrombocytopenia, unspecified: Secondary | ICD-10-CM | POA: Insufficient documentation

## 2020-01-17 DIAGNOSIS — Z8572 Personal history of non-Hodgkin lymphomas: Secondary | ICD-10-CM | POA: Insufficient documentation

## 2020-01-17 DIAGNOSIS — Z923 Personal history of irradiation: Secondary | ICD-10-CM | POA: Diagnosis not present

## 2020-01-17 DIAGNOSIS — C9000 Multiple myeloma not having achieved remission: Secondary | ICD-10-CM | POA: Diagnosis not present

## 2020-01-17 DIAGNOSIS — E1122 Type 2 diabetes mellitus with diabetic chronic kidney disease: Secondary | ICD-10-CM | POA: Insufficient documentation

## 2020-01-17 DIAGNOSIS — E039 Hypothyroidism, unspecified: Secondary | ICD-10-CM | POA: Insufficient documentation

## 2020-01-17 DIAGNOSIS — Z5111 Encounter for antineoplastic chemotherapy: Secondary | ICD-10-CM | POA: Diagnosis not present

## 2020-01-17 DIAGNOSIS — F0390 Unspecified dementia without behavioral disturbance: Secondary | ICD-10-CM | POA: Diagnosis not present

## 2020-01-17 DIAGNOSIS — E876 Hypokalemia: Secondary | ICD-10-CM | POA: Diagnosis not present

## 2020-01-17 DIAGNOSIS — Z7982 Long term (current) use of aspirin: Secondary | ICD-10-CM | POA: Insufficient documentation

## 2020-01-17 DIAGNOSIS — E1136 Type 2 diabetes mellitus with diabetic cataract: Secondary | ICD-10-CM | POA: Insufficient documentation

## 2020-01-17 LAB — COMPREHENSIVE METABOLIC PANEL
ALT: 21 U/L (ref 0–44)
AST: 27 U/L (ref 15–41)
Albumin: 3 g/dL — ABNORMAL LOW (ref 3.5–5.0)
Alkaline Phosphatase: 47 U/L (ref 38–126)
Anion gap: 7 (ref 5–15)
BUN: 16 mg/dL (ref 8–23)
CO2: 27 mmol/L (ref 22–32)
Calcium: 9 mg/dL (ref 8.9–10.3)
Chloride: 107 mmol/L (ref 98–111)
Creatinine, Ser: 1.21 mg/dL — ABNORMAL HIGH (ref 0.44–1.00)
GFR calc Af Amer: 51 mL/min — ABNORMAL LOW (ref >60)
GFR calc non Af Amer: 44 mL/min — ABNORMAL LOW (ref >60)
Glucose, Bld: 97 mg/dL (ref 70–99)
Potassium: 3.8 mmol/L (ref 3.5–5.1)
Sodium: 141 mmol/L (ref 135–145)
Total Bilirubin: 0.6 mg/dL (ref 0.3–1.2)
Total Protein: 6.4 g/dL — ABNORMAL LOW (ref 6.5–8.1)

## 2020-01-17 LAB — CBC WITH DIFFERENTIAL/PLATELET
Abs Immature Granulocytes: 0.01 10*3/uL (ref 0.00–0.07)
Basophils Absolute: 0 10*3/uL (ref 0.0–0.1)
Basophils Relative: 1 %
Eosinophils Absolute: 0.3 10*3/uL (ref 0.0–0.5)
Eosinophils Relative: 6 %
HCT: 30 % — ABNORMAL LOW (ref 36.0–46.0)
Hemoglobin: 9.8 g/dL — ABNORMAL LOW (ref 12.0–15.0)
Immature Granulocytes: 0 %
Lymphocytes Relative: 15 %
Lymphs Abs: 0.6 10*3/uL — ABNORMAL LOW (ref 0.7–4.0)
MCH: 32.7 pg (ref 26.0–34.0)
MCHC: 32.7 g/dL (ref 30.0–36.0)
MCV: 100 fL (ref 80.0–100.0)
Monocytes Absolute: 0.5 10*3/uL (ref 0.1–1.0)
Monocytes Relative: 12 %
Neutro Abs: 2.7 10*3/uL (ref 1.7–7.7)
Neutrophils Relative %: 66 %
Platelets: 99 10*3/uL — ABNORMAL LOW (ref 150–400)
RBC: 3 MIL/uL — ABNORMAL LOW (ref 3.87–5.11)
RDW: 18.1 % — ABNORMAL HIGH (ref 11.5–15.5)
WBC: 4.2 10*3/uL (ref 4.0–10.5)
nRBC: 0 % (ref 0.0–0.2)

## 2020-01-17 LAB — LACTATE DEHYDROGENASE: LDH: 162 U/L (ref 98–192)

## 2020-01-17 MED ORDER — BORTEZOMIB CHEMO SQ INJECTION 3.5 MG (2.5MG/ML)
1.3000 mg/m2 | Freq: Once | INTRAMUSCULAR | Status: AC
  Administered 2020-01-17: 2.25 mg via SUBCUTANEOUS
  Filled 2020-01-17: qty 0.9

## 2020-01-17 MED ORDER — DEXAMETHASONE 4 MG PO TABS
10.0000 mg | ORAL_TABLET | Freq: Once | ORAL | Status: AC
  Administered 2020-01-17: 10 mg via ORAL
  Filled 2020-01-17: qty 3

## 2020-01-17 NOTE — Patient Instructions (Signed)
[facility] Discharge Instructions for Patients Receiving Chemotherapy   Beginning January 23rd 2017 lab work for the Cancer Center will be done in the  Main lab at [facility] on 1st floor. If you have a lab appointment with the Cancer Center please come in thru the  Main Entrance and check in at the main information desk   Today you received the following chemotherapy agents Velcade injection. Follow-up as scheduled. Call clinic for any questions or concerns  To help prevent nausea and vomiting after your treatment, we encourage you to take your nausea medication   If you develop nausea and vomiting, or diarrhea that is not controlled by your medication, call the clinic.  The clinic phone number is [phone]. Office hours are Monday-Friday 8:30am-5:00pm.  BELOW ARE SYMPTOMS THAT SHOULD BE REPORTED IMMEDIATELY:  *FEVER GREATER THAN 101.0 F  *CHILLS WITH OR WITHOUT FEVER  NAUSEA AND VOMITING THAT IS NOT CONTROLLED WITH YOUR NAUSEA MEDICATION  *UNUSUAL SHORTNESS OF BREATH  *UNUSUAL BRUISING OR BLEEDING  TENDERNESS IN MOUTH AND THROAT WITH OR WITHOUT PRESENCE OF ULCERS  *URINARY PROBLEMS  *BOWEL PROBLEMS  UNUSUAL RASH Items with * indicate a potential emergency and should be followed up as soon as possible. If you have an emergency after office hours please contact your primary care physician or go to the nearest emergency department.  Please call the clinic during office hours if you have any questions or concerns.   You may also contact the Patient Navigator at [phone] should you have any questions or need assistance in obtaining follow up care.      Resources For Cancer Patients and their Caregivers ? American Cancer Society: Can assist with transportation, wigs, general needs, runs Look Good Feel Better.        1-888-227-6333 ? Cancer Care: Provides financial assistance, online support groups, medication/co-pay assistance.   1-800-813-HOPE (4673) ? [facility] Assists [place] cancer patients and their families through emotional , educational and financial support.  336-427-4357 ? [place] DSS Where to apply for food stamps, Medicaid and utility assistance. 336-342-1394 ? [facility]: Transportation to medical appointments. 336-347-2287 ? Social Security Administration: May apply for disability if have a Stage IV cancer. 336-342-7796 1-800-772-1213 ? [place] Aging, Disability and Transit Services: Assists with nutrition, care and transit needs. 336-349-2343         

## 2020-01-17 NOTE — Progress Notes (Signed)
Dell M Wimberly tolerated Velcade injection well without complaints or incident. Labs reviewed prior to administering this medication. VSS Pt discharged self ambulatory in satisfactory condition

## 2020-01-18 LAB — PROTEIN ELECTROPHORESIS, SERUM
A/G Ratio: 1 (ref 0.7–1.7)
Albumin ELP: 2.8 g/dL — ABNORMAL LOW (ref 2.9–4.4)
Alpha-1-Globulin: 0.2 g/dL (ref 0.0–0.4)
Alpha-2-Globulin: 0.7 g/dL (ref 0.4–1.0)
Beta Globulin: 0.6 g/dL — ABNORMAL LOW (ref 0.7–1.3)
Gamma Globulin: 1.2 g/dL (ref 0.4–1.8)
Globulin, Total: 2.8 g/dL (ref 2.2–3.9)
M-Spike, %: 0.7 g/dL — ABNORMAL HIGH
Total Protein ELP: 5.6 g/dL — ABNORMAL LOW (ref 6.0–8.5)

## 2020-01-18 LAB — KAPPA/LAMBDA LIGHT CHAINS
Kappa free light chain: 36.4 mg/L — ABNORMAL HIGH (ref 3.3–19.4)
Kappa, lambda light chain ratio: 0.79 (ref 0.26–1.65)
Lambda free light chains: 45.9 mg/L — ABNORMAL HIGH (ref 5.7–26.3)

## 2020-01-19 ENCOUNTER — Other Ambulatory Visit (HOSPITAL_COMMUNITY): Payer: Self-pay | Admitting: Nurse Practitioner

## 2020-01-19 ENCOUNTER — Other Ambulatory Visit (HOSPITAL_COMMUNITY): Payer: Self-pay | Admitting: *Deleted

## 2020-01-19 DIAGNOSIS — C9000 Multiple myeloma not having achieved remission: Secondary | ICD-10-CM

## 2020-01-19 MED ORDER — LENALIDOMIDE 10 MG PO CAPS: ORAL_CAPSULE | ORAL | 0 refills | Status: DC

## 2020-01-24 ENCOUNTER — Other Ambulatory Visit: Payer: Self-pay

## 2020-01-24 ENCOUNTER — Inpatient Hospital Stay (HOSPITAL_COMMUNITY): Payer: Medicare PPO

## 2020-01-24 ENCOUNTER — Encounter (HOSPITAL_COMMUNITY): Payer: Self-pay | Admitting: Hematology

## 2020-01-24 ENCOUNTER — Inpatient Hospital Stay (HOSPITAL_COMMUNITY): Payer: Medicare PPO | Admitting: Hematology

## 2020-01-24 DIAGNOSIS — E1136 Type 2 diabetes mellitus with diabetic cataract: Secondary | ICD-10-CM | POA: Diagnosis not present

## 2020-01-24 DIAGNOSIS — C9000 Multiple myeloma not having achieved remission: Secondary | ICD-10-CM

## 2020-01-24 DIAGNOSIS — Z905 Acquired absence of kidney: Secondary | ICD-10-CM | POA: Diagnosis not present

## 2020-01-24 DIAGNOSIS — I129 Hypertensive chronic kidney disease with stage 1 through stage 4 chronic kidney disease, or unspecified chronic kidney disease: Secondary | ICD-10-CM | POA: Diagnosis not present

## 2020-01-24 DIAGNOSIS — E039 Hypothyroidism, unspecified: Secondary | ICD-10-CM | POA: Diagnosis not present

## 2020-01-24 DIAGNOSIS — Z5111 Encounter for antineoplastic chemotherapy: Secondary | ICD-10-CM | POA: Diagnosis not present

## 2020-01-24 DIAGNOSIS — N182 Chronic kidney disease, stage 2 (mild): Secondary | ICD-10-CM | POA: Diagnosis not present

## 2020-01-24 DIAGNOSIS — E1122 Type 2 diabetes mellitus with diabetic chronic kidney disease: Secondary | ICD-10-CM | POA: Diagnosis not present

## 2020-01-24 DIAGNOSIS — E876 Hypokalemia: Secondary | ICD-10-CM | POA: Diagnosis not present

## 2020-01-24 LAB — CBC WITH DIFFERENTIAL/PLATELET
Abs Immature Granulocytes: 0.01 10*3/uL (ref 0.00–0.07)
Basophils Absolute: 0 10*3/uL (ref 0.0–0.1)
Basophils Relative: 1 %
Eosinophils Absolute: 0.1 10*3/uL (ref 0.0–0.5)
Eosinophils Relative: 3 %
HCT: 30.6 % — ABNORMAL LOW (ref 36.0–46.0)
Hemoglobin: 10 g/dL — ABNORMAL LOW (ref 12.0–15.0)
Immature Granulocytes: 0 %
Lymphocytes Relative: 13 %
Lymphs Abs: 0.6 10*3/uL — ABNORMAL LOW (ref 0.7–4.0)
MCH: 32.7 pg (ref 26.0–34.0)
MCHC: 32.7 g/dL (ref 30.0–36.0)
MCV: 100 fL (ref 80.0–100.0)
Monocytes Absolute: 0.7 10*3/uL (ref 0.1–1.0)
Monocytes Relative: 15 %
Neutro Abs: 3 10*3/uL (ref 1.7–7.7)
Neutrophils Relative %: 68 %
Platelets: 86 10*3/uL — ABNORMAL LOW (ref 150–400)
RBC: 3.06 MIL/uL — ABNORMAL LOW (ref 3.87–5.11)
RDW: 18.6 % — ABNORMAL HIGH (ref 11.5–15.5)
WBC: 4.4 10*3/uL (ref 4.0–10.5)
nRBC: 0 % (ref 0.0–0.2)

## 2020-01-24 LAB — COMPREHENSIVE METABOLIC PANEL
ALT: 22 U/L (ref 0–44)
AST: 31 U/L (ref 15–41)
Albumin: 3.2 g/dL — ABNORMAL LOW (ref 3.5–5.0)
Alkaline Phosphatase: 46 U/L (ref 38–126)
Anion gap: 7 (ref 5–15)
BUN: 19 mg/dL (ref 8–23)
CO2: 26 mmol/L (ref 22–32)
Calcium: 8.8 mg/dL — ABNORMAL LOW (ref 8.9–10.3)
Chloride: 108 mmol/L (ref 98–111)
Creatinine, Ser: 1.26 mg/dL — ABNORMAL HIGH (ref 0.44–1.00)
GFR calc Af Amer: 48 mL/min — ABNORMAL LOW (ref >60)
GFR calc non Af Amer: 42 mL/min — ABNORMAL LOW (ref >60)
Glucose, Bld: 116 mg/dL — ABNORMAL HIGH (ref 70–99)
Potassium: 4 mmol/L (ref 3.5–5.1)
Sodium: 141 mmol/L (ref 135–145)
Total Bilirubin: 0.5 mg/dL (ref 0.3–1.2)
Total Protein: 6.5 g/dL (ref 6.5–8.1)

## 2020-01-24 MED ORDER — DEXAMETHASONE 4 MG PO TABS
10.0000 mg | ORAL_TABLET | Freq: Once | ORAL | Status: AC
  Administered 2020-01-24: 10 mg via ORAL

## 2020-01-24 MED ORDER — DEXAMETHASONE 4 MG PO TABS
ORAL_TABLET | ORAL | Status: AC
  Filled 2020-01-24: qty 3

## 2020-01-24 MED ORDER — BORTEZOMIB CHEMO SQ INJECTION 3.5 MG (2.5MG/ML)
1.3000 mg/m2 | Freq: Once | INTRAMUSCULAR | Status: AC
  Administered 2020-01-24: 12:00:00 2.25 mg via SUBCUTANEOUS
  Filled 2020-01-24: qty 0.9

## 2020-01-24 NOTE — Patient Instructions (Addendum)
[place] at [facility] Discharge Instructions  You were seen today by Dr. Delton Coombes. He went over your recent lab results, your myeloma numbers are stable. Continue treatments 2 weeks on and 2 weeks off. He will see you back in 4 weeks for labs and follow up.   Thank you for choosing [place] at [facility] to provide your oncology and hematology care.  To afford each patient quality time with our provider, please arrive at least 15 minutes before your scheduled appointment time.   If you have a lab appointment with the [facility] please come in thru the  Main Entrance and check in at the main information desk  You need to re-schedule your appointment should you arrive 10 or more minutes late.  We strive to give you quality time with our providers, and arriving late affects you and other patients whose appointments are after yours.  Also, if you no show three or more times for appointments you may be dismissed from the clinic at the providers discretion.     Again, thank you for choosing [facility].  Our hope is that these requests will decrease the amount of time that you wait before being seen by our physicians.       _____________________________________________________________  Should you have questions after your visit to [facility], please contact our office at [phone] between the hours of 8:00 a.m. and 4:30 p.m.  Voicemails left after 4:00 p.m. will not be returned until the following business day.  For prescription refill requests, have your pharmacy contact our office and allow 72 hours.    Cancer Center Support Programs:   > Cancer Support Group  2nd Tuesday of the month 1pm-2pm, Journey Room

## 2020-01-24 NOTE — Progress Notes (Signed)
Amanda Wu, [place] [id number]   CLINIC:  Medical Oncology/Hematology  PCP:  Monico Blitz, MD [place] [address] [phone]   REASON FOR VISIT:  Follow-up for Multiple Myeloma   CURRENT THERAPY: Rev/Velcade/Dex  BRIEF ONCOLOGIC HISTORY:  Oncology History  Multiple myeloma ([facility])  07/09/2017 Initial Diagnosis   Multiple myeloma ([facility])   06/01/2018 -  Chemotherapy   The patient had bortezomib SQ (VELCADE) chemo injection 1.75 mg, 1 mg/m2 = 1.75 mg (100 % of original dose 1 mg/m2), Subcutaneous,  Once, 22 of 24 cycles Dose modification: 1 mg/m2 (original dose 1 mg/m2, Cycle 1, Reason: Provider Judgment), 1.3 mg/m2 (original dose 1 mg/m2, Cycle 8, Reason: Provider Judgment) Administration: 1.75 mg (06/01/2018), 1.75 mg (06/08/2018), 1.75 mg (06/15/2018), 1.75 mg (06/29/2018), 1.75 mg (07/06/2018), 1.75 mg (07/13/2018), 1.75 mg (07/27/2018), 1.75 mg (08/03/2018), 1.75 mg (08/10/2018), 1.75 mg (08/24/2018), 1.75 mg (08/31/2018), 1.75 mg (09/07/2018), 1.75 mg (09/21/2018), 1.75 mg (09/28/2018), 1.75 mg (10/05/2018), 1.75 mg (10/19/2018), 1.75 mg (10/26/2018), 1.75 mg (11/02/2018), 1.75 mg (11/16/2018), 1.75 mg (11/23/2018), 1.75 mg (11/30/2018), 2.25 mg (12/17/2018), 2.25 mg (12/24/2018), 2.25 mg (01/04/2019), 2.25 mg (01/18/2019), 2.25 mg (01/25/2019), 2.25 mg (02/01/2019), 2.25 mg (02/15/2019), 2.25 mg (02/22/2019), 2.25 mg (03/01/2019), 2.25 mg (03/15/2019), 2.25 mg (03/23/2019), 2.25 mg (03/31/2019), 2.25 mg (04/12/2019), 2.25 mg (04/19/2019), 2.25 mg (04/26/2019), 2.25 mg (05/10/2019), 2.25 mg (05/17/2019), 2.25 mg (05/24/2019), 2.25 mg (06/07/2019), 2.25 mg (06/14/2019), 2.25 mg (06/21/2019), 2.25 mg (07/05/2019), 2.25 mg (07/12/2019), 2.25 mg (07/19/2019), 2.25 mg (08/02/2019), 2.25 mg (08/09/2019), 2.25 mg (08/16/2019), 2.25 mg (08/30/2019), 2.25 mg (09/06/2019), 2.25 mg (09/13/2019), 2.25 mg (09/27/2019), 2.25 mg (10/04/2019), 2.25 mg (10/11/2019), 2.25 mg (10/25/2019), 2.25 mg (11/08/2019), 2.25  mg (11/22/2019), 2.25 mg (11/29/2019), 2.25 mg (12/06/2019), 2.25 mg (12/20/2019), 2.25 mg (12/27/2019), 2.25 mg (01/03/2020), 2.25 mg (01/17/2020)  for chemotherapy treatment.        INTERVAL HISTORY:  Amanda Wu 74 y.o. female seen for follow-up of her multiple myeloma.  She is tolerating Revlimid and Velcade very well.  Appetite is 75%.  Energy levels are 50%.  No new onset pains reported.  Chronic diarrhea has been stable.  Denies any tingling or numbness extremities.  Continues to struggle with memory problems based on previous radiation for lymphoma of the brain.  She is accompanied by her 2 sisters today.  REVIEW OF SYSTEMS:  Review of Systems  Gastrointestinal: Positive for diarrhea.  All other systems reviewed and are negative.    PAST MEDICAL/SURGICAL HISTORY:  Past Medical History:  Diagnosis Date  . Benign hypertension   . Cataract   . 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 Diabetes mellitus without complication ([facility])   . H/O partial nephrectomy   . Hypokalemia   . Hypothyroidism   . Impaired cognition   . Multiple myeloma (HCC)    Dr Maylon Peppers, [facility]  . Thyroid disease    Past Surgical History:  Procedure Laterality Date  . ABDOMINAL HYSTERECTOMY    . CRANIOTOMY     for lymphoma  . YAG LASER APPLICATION Right 4/82/5003   Procedure: YAG LASER APPLICATION;  Surgeon: [name], MD;  Location: [facility];  Service: Ophthalmology;  Laterality: Right;     SOCIAL HISTORY:  Social History   Socioeconomic History  . Marital status: Single    Spouse name: Not on file  . Number of children: Not on file  . Years of education: Not on file  . Highest education level:  Not on file  Occupational History  . Not on file  Tobacco Use  . Smoking status: Never Smoker  . Smokeless tobacco: Never Used  Substance and Sexual Activity  . Alcohol use: No  . Drug use: No  . Sexual activity: Not on file  Other Topics Concern  . Not on file  Social History Narrative  . Not on  file   Social Determinants of Health   Financial Resource Strain:   . Difficulty of Paying Living Expenses: Not on file  Food Insecurity:   . Worried About Charity fundraiser in the Last Year: Not on file  . Ran Out of Food in the Last Year: Not on file  Transportation Needs:   . Lack of Transportation (Medical): Not on file  . Lack of Transportation (Non-Medical): Not on file  Physical Activity:   . Days of Exercise per Week: Not on file  . Minutes of Exercise per Session: Not on file  Stress:   . Feeling of Stress : Not on file  Social Connections:   . Frequency of Communication with Friends and Family: Not on file  . Frequency of Social Gatherings with Friends and Family: Not on file  . Attends Religious Services: Not on file  . Active Member of Clubs or Organizations: Not on file  . Attends Archivist Meetings: Not on file  . Marital Status: Not on file  Intimate Partner Violence:   . Fear of Current or Ex-Partner: Not on file  . Emotionally Abused: Not on file  . Physically Abused: Not on file  . Sexually Abused: Not on file    FAMILY HISTORY:  Family History  Problem Relation Age of Onset  . Depression Mother   . Diabetes Mother   . Heart disease Father   . Cancer - Prostate Brother   . Cancer Brother   . Cancer Sister     CURRENT MEDICATIONS:  Outpatient Encounter Medications as of 01/24/2020  Medication Sig Note  . ACCU-CHEK SMARTVIEW test strip    . Accu-Chek Softclix Lancets lancets    . acyclovir (ZOVIRAX) 400 MG tablet TAKE 1 TABLET BY MOUTH TWICE DAILY   . aspirin EC 81 MG tablet Take 81 mg by mouth daily.   . bortezomib IV (VELCADE) 3.5 MG injection Inject into the vein.   . calcium-vitamin D (OSCAL WITH D) 500-200 MG-UNIT tablet Take 1 tablet by mouth daily with breakfast.   . dexamethasone (DECADRON) 4 MG tablet TAKE TWO AND ONE-HALF TABLETS BY MOUTH WEEKLY FOR THREE WEEKS (THREE WEEKS ON, ONE WEEK OFF) IN A ROW AND REPEAT MONTHLY 06/22/2018:  Patient states that she is given the dexamethasone while here getting her velcade injection  . donepezil (ARICEPT) 10 MG tablet TAKE 1 TABLET BY MOUTH every evening   . ferrous sulfate 325 (65 FE) MG tablet Take 325 mg by mouth daily with breakfast.   . lenalidomide (REVLIMID) 10 MG capsule TAKE 1 CAPSULE BY MOUTH  DAILY FOR 14 DAYS, THEN 14  DAYS OFF   . levothyroxine (SYNTHROID, LEVOTHROID) 25 MCG tablet Take 25 mcg by mouth daily before breakfast.   . losartan (COZAAR) 100 MG tablet Take 100 mg by mouth daily.   . Multiple Vitamins-Minerals (MULTIVITAMIN WOMEN 50+ PO) Take by mouth.   . potassium chloride (KLOR-CON) 10 MEQ tablet TAKE THREE TABLETS BY MOUTH EVERY DAY   . [DISCONTINUED] amLODipine (NORVASC) 5 MG tablet Take 5 mg by mouth daily.    Marland Kitchen acetaminophen (  TYLENOL) 500 MG tablet Take 1,000 mg by mouth every 6 (six) hours as needed for mild pain.     Facility-Administered Encounter Medications as of 01/24/2020  Medication  . prochlorperazine (COMPAZINE) tablet 10 mg    ALLERGIES:  Allergies  Allergen Reactions  . Lotensin [Benazepril Hcl]     Facial swelling (Angioedema) Note: ARBs should also be contraindicated, but she is on Losartan!     PHYSICAL EXAM:  ECOG Performance status: 1  Vitals:   01/24/20 1034  BP: 107/63  Pulse: 65  Resp: 18  Temp: (!) 97.1 F (36.2 C)  SpO2: 100%   Filed Weights   01/24/20 1034  Weight: 117 lb 1.6 oz (53.1 kg)    Physical Exam Vitals reviewed.  Constitutional:      Appearance: Normal appearance.  HENT:     Head: Normocephalic.     Nose: Nose normal.     Mouth/Throat:     Mouth: Mucous membranes are moist.     Pharynx: Oropharynx is clear.  Eyes:     Extraocular Movements: Extraocular movements intact.     Conjunctiva/sclera: Conjunctivae normal.  Cardiovascular:     Rate and Rhythm: Normal rate and regular rhythm.     Pulses: Normal pulses.     Heart sounds: Normal heart sounds.  Pulmonary:     Effort: Pulmonary  effort is normal.     Breath sounds: Normal breath sounds.  Abdominal:     General: Bowel sounds are normal.     Palpations: Abdomen is soft.  Musculoskeletal:        General: Normal range of motion.     Cervical back: Normal range of motion.  Skin:    General: Skin is warm and dry.  Neurological:     Mental Status: She is alert. Mental status is at baseline.  Psychiatric:        Mood and Affect: Mood normal.        Behavior: Behavior normal.      LABORATORY DATA:  I have reviewed the labs as listed.  CBC    Component Value Date/Time   WBC 4.4 01/24/2020 1011   RBC 3.06 (L) 01/24/2020 1011   HGB 10.0 (L) 01/24/2020 1011   HCT 30.6 (L) 01/24/2020 1011   PLT 86 (L) 01/24/2020 1011   MCV 100.0 01/24/2020 1011   MCH 32.7 01/24/2020 1011   MCHC 32.7 01/24/2020 1011   RDW 18.6 (H) 01/24/2020 1011   LYMPHSABS 0.6 (L) 01/24/2020 1011   MONOABS 0.7 01/24/2020 1011   EOSABS 0.1 01/24/2020 1011   BASOSABS 0.0 01/24/2020 1011   CMP Latest Ref Rng & Units 01/24/2020 01/17/2020 01/03/2020  Glucose 70 - 99 mg/dL 116(H) 97 124(H)  BUN 8 - 23 mg/dL 19 16 26(H)  Creatinine 0.44 - 1.00 mg/dL 1.26(H) 1.21(H) 1.34(H)  Sodium 135 - 145 mmol/L 141 141 141  Potassium 3.5 - 5.1 mmol/L 4.0 3.8 4.3  Chloride 98 - 111 mmol/L 108 107 107  CO2 22 - 32 mmol/L _0 Calcium 8.9 - 10.3 mg/dL 8.8(L) 9.0 9.2  Total Protein 6.5 - 8.1 g/dL 6.5 6.4(L) 6.7  Total Bilirubin 0.3 - 1.2 mg/dL 0.5 0.6 0.8  Alkaline Phos 38 - 126 U/L 46 47 47  AST 15 - 41 U/L _1 ALT 0 - 44 U/L _2 I have independently reviewed scans and discussed with the patient.     ASSESSMENT & PLAN:   Multiple  myeloma (HCC) 1.  IgG lambda multiple myeloma: - Revlimid 25 mg and dexamethasone 3 weeks on 1 week off from September 2009 through November 2017. - Velcade 1 mg per metered squared 3 weeks on 1 week off started on 06/01/2018, dose increased to 1.3 mg/M squared on 12/16/2018. -Revlimid 10 mg 2 weeks on 2  weeks off started on 08/14/2018 for better response.  She also takes dexamethasone 10 mg on days of Velcade. -She is tolerating Velcade 3 weeks on 1 week off on Revlimid 2 weeks on 2 weeks off very well. -We reviewed her myeloma labs from 01/17/2020.  M spike slightly went up to 0.7.  This was previously 0.6 and prior to that 0.8. -Free lambda light chains of 45.9.  Kappa light chains of 36.4.  Ratio is 0.79. -I will continue the same regimen at this time due to stability of the M spike.  We are not treating her aggressively because of her overall condition.  I will reevaluate her myeloma panel in 3 weeks and see her back in 4 weeks.  2.  Bone strengthening: -She received Zometa monthly starting September 2009, has been discontinued for several years.  3.  CNS MALT like lymphoma: - Treated with chemotherapy and radiation, last brain MRI in 2013, CT of the brain in 2018 with no evidence of recurrence. -She is continuing Aricept 10 mg daily for dementia.  4.  Shingles prophylaxis: -She will continue acyclovir 400 mg twice daily for shingles prophylaxis.  5. Hypertension: -We held her Norvasc.  She will continue losartan.  Systolic blood pressure today is 107.  6.  Mild CKD: -Her creatinine is stable between 1.2-1.3.  7.  Thrombocytopenia: -She has moderate thrombocytopenia with platelet count between 80 and 90.  This is also likely from myelosuppression from Revlimid and Velcade.       Orders placed this encounter:  No orders of the defined types were placed in this encounter.     Derek Jack, MD [place] [phone]

## 2020-01-24 NOTE — Patient Instructions (Signed)
[facility] Discharge Instructions for Patients Receiving Chemotherapy   Beginning January 23rd 2017 lab work for the Cancer Center will be done in the  Main lab at [place] on 1st floor. If you have a lab appointment with the Cancer Center please come in thru the  Main Entrance and check in at the main information desk   Today you received the following chemotherapy agents Velcade injection. Follow-up as scheduled. Call clinic for any questions or concerns  To help prevent nausea and vomiting after your treatment, we encourage you to take your nausea medication   If you develop nausea and vomiting, or diarrhea that is not controlled by your medication, call the clinic.  The clinic phone number is [phone]. Office hours are Monday-Friday 8:30am-5:00pm.  BELOW ARE SYMPTOMS THAT SHOULD BE REPORTED IMMEDIATELY:  *FEVER GREATER THAN 101.0 F  *CHILLS WITH OR WITHOUT FEVER  NAUSEA AND VOMITING THAT IS NOT CONTROLLED WITH YOUR NAUSEA MEDICATION  *UNUSUAL SHORTNESS OF BREATH  *UNUSUAL BRUISING OR BLEEDING  TENDERNESS IN MOUTH AND THROAT WITH OR WITHOUT PRESENCE OF ULCERS  *URINARY PROBLEMS  *BOWEL PROBLEMS  UNUSUAL RASH Items with * indicate a potential emergency and should be followed up as soon as possible. If you have an emergency after office hours please contact your primary care physician or go to the nearest emergency department.  Please call the clinic during office hours if you have any questions or concerns.   You may also contact the Patient Navigator at [phone] should you have any questions or need assistance in obtaining follow up care.      Resources For Cancer Patients and their Caregivers ? American Cancer Society: Can assist with transportation, wigs, general needs, runs Look Good Feel Better.        1-888-227-6333 ? Cancer Care: Provides financial assistance, online support groups, medication/co-pay assistance.   1-800-813-HOPE (4673) ? [facility] Assists [place] cancer patients and their families through emotional , educational and financial support.  336-427-4357 ? [place] DSS Where to apply for food stamps, Medicaid and utility assistance. 336-342-1394 ? [facility]: Transportation to medical appointments. 336-347-2287 ? Social Security Administration: May apply for disability if have a Stage IV cancer. 336-342-7796 1-800-772-1213 ? [place] Aging, Disability and Transit Services: Assists with nutrition, care and transit needs. 336-349-2343         

## 2020-01-24 NOTE — Progress Notes (Signed)
[id number] Labs reviewed with and pt seen by Dr. Delton Coombes and pt approved for Velcade injection today per MD                                  [place] tolerated Velcade injection well without complaints or incident. Pt discharged self ambulatory in satisfactory condition accompanied by her sisters

## 2020-01-28 NOTE — Assessment & Plan Note (Addendum)
1.  IgG lambda multiple myeloma: - Revlimid 25 mg and dexamethasone 3 weeks on 1 week off from September 2009 through November 2017. - Velcade 1 mg per metered squared 3 weeks on 1 week off started on 06/01/2018, dose increased to 1.3 mg/M squared on 12/16/2018. -Revlimid 10 mg 2 weeks on 2 weeks off started on 08/14/2018 for better response.  She also takes dexamethasone 10 mg on days of Velcade. -She is tolerating Velcade 3 weeks on 1 week off on Revlimid 2 weeks on 2 weeks off very well. -We reviewed her myeloma labs from 01/17/2020.  M spike slightly went up to 0.7.  This was previously 0.6 and prior to that 0.8. -Free lambda light chains of 45.9.  Kappa light chains of 36.4.  Ratio is 0.79. -I will continue the same regimen at this time due to stability of the M spike.  We are not treating her aggressively because of her overall condition.  I will reevaluate her myeloma panel in 3 weeks and see her back in 4 weeks.  2.  Bone strengthening: -She received Zometa monthly starting September 2009, has been discontinued for several years.  3.  CNS MALT like lymphoma: - Treated with chemotherapy and radiation, last brain MRI in 2013, CT of the brain in 2018 with no evidence of recurrence. -She is continuing Aricept 10 mg daily for dementia.  4.  Shingles prophylaxis: -She will continue acyclovir 400 mg twice daily for shingles prophylaxis.  5. Hypertension: -We held her Norvasc.  She will continue losartan.  Systolic blood pressure today is 107.  6.  Mild CKD: -Her creatinine is stable between 1.2-1.3.  7.  Thrombocytopenia: -She has moderate thrombocytopenia with platelet count between 80 and 90.  This is also likely from myelosuppression from Revlimid and Velcade.

## 2020-01-29 ENCOUNTER — Ambulatory Visit: Payer: Medicare PPO

## 2020-01-31 ENCOUNTER — Inpatient Hospital Stay (HOSPITAL_COMMUNITY): Payer: Medicare PPO

## 2020-01-31 ENCOUNTER — Other Ambulatory Visit: Payer: Self-pay

## 2020-01-31 VITALS — BP 124/42 | HR 63 | Temp 97.2°F | Resp 16

## 2020-01-31 DIAGNOSIS — E1122 Type 2 diabetes mellitus with diabetic chronic kidney disease: Secondary | ICD-10-CM | POA: Diagnosis not present

## 2020-01-31 DIAGNOSIS — Z905 Acquired absence of kidney: Secondary | ICD-10-CM | POA: Diagnosis not present

## 2020-01-31 DIAGNOSIS — E039 Hypothyroidism, unspecified: Secondary | ICD-10-CM | POA: Diagnosis not present

## 2020-01-31 DIAGNOSIS — Z5111 Encounter for antineoplastic chemotherapy: Secondary | ICD-10-CM | POA: Diagnosis not present

## 2020-01-31 DIAGNOSIS — C9000 Multiple myeloma not having achieved remission: Secondary | ICD-10-CM | POA: Diagnosis not present

## 2020-01-31 DIAGNOSIS — I129 Hypertensive chronic kidney disease with stage 1 through stage 4 chronic kidney disease, or unspecified chronic kidney disease: Secondary | ICD-10-CM | POA: Diagnosis not present

## 2020-01-31 DIAGNOSIS — N182 Chronic kidney disease, stage 2 (mild): Secondary | ICD-10-CM | POA: Diagnosis not present

## 2020-01-31 DIAGNOSIS — E1136 Type 2 diabetes mellitus with diabetic cataract: Secondary | ICD-10-CM | POA: Diagnosis not present

## 2020-01-31 DIAGNOSIS — E876 Hypokalemia: Secondary | ICD-10-CM | POA: Diagnosis not present

## 2020-01-31 LAB — CBC WITH DIFFERENTIAL/PLATELET
Abs Immature Granulocytes: 0 10*3/uL (ref 0.00–0.07)
Basophils Absolute: 0 10*3/uL (ref 0.0–0.1)
Basophils Relative: 1 %
Eosinophils Absolute: 0.1 10*3/uL (ref 0.0–0.5)
Eosinophils Relative: 2 %
HCT: 31.7 % — ABNORMAL LOW (ref 36.0–46.0)
Hemoglobin: 10.2 g/dL — ABNORMAL LOW (ref 12.0–15.0)
Immature Granulocytes: 0 %
Lymphocytes Relative: 17 %
Lymphs Abs: 0.7 10*3/uL (ref 0.7–4.0)
MCH: 32.3 pg (ref 26.0–34.0)
MCHC: 32.2 g/dL (ref 30.0–36.0)
MCV: 100.3 fL — ABNORMAL HIGH (ref 80.0–100.0)
Monocytes Absolute: 0.4 10*3/uL (ref 0.1–1.0)
Monocytes Relative: 9 %
Neutro Abs: 2.7 10*3/uL (ref 1.7–7.7)
Neutrophils Relative %: 71 %
Platelets: 96 10*3/uL — ABNORMAL LOW (ref 150–400)
RBC: 3.16 MIL/uL — ABNORMAL LOW (ref 3.87–5.11)
RDW: 18.6 % — ABNORMAL HIGH (ref 11.5–15.5)
WBC: 3.8 10*3/uL — ABNORMAL LOW (ref 4.0–10.5)
nRBC: 0 % (ref 0.0–0.2)

## 2020-01-31 LAB — COMPREHENSIVE METABOLIC PANEL
ALT: 25 U/L (ref 0–44)
AST: 37 U/L (ref 15–41)
Albumin: 3.3 g/dL — ABNORMAL LOW (ref 3.5–5.0)
Alkaline Phosphatase: 51 U/L (ref 38–126)
Anion gap: 10 (ref 5–15)
BUN: 25 mg/dL — ABNORMAL HIGH (ref 8–23)
CO2: 25 mmol/L (ref 22–32)
Calcium: 9.1 mg/dL (ref 8.9–10.3)
Chloride: 107 mmol/L (ref 98–111)
Creatinine, Ser: 1.3 mg/dL — ABNORMAL HIGH (ref 0.44–1.00)
GFR calc Af Amer: 46 mL/min — ABNORMAL LOW (ref >60)
GFR calc non Af Amer: 40 mL/min — ABNORMAL LOW (ref >60)
Glucose, Bld: 122 mg/dL — ABNORMAL HIGH (ref 70–99)
Potassium: 4.2 mmol/L (ref 3.5–5.1)
Sodium: 142 mmol/L (ref 135–145)
Total Bilirubin: 0.6 mg/dL (ref 0.3–1.2)
Total Protein: 6.6 g/dL (ref 6.5–8.1)

## 2020-01-31 MED ORDER — BORTEZOMIB CHEMO SQ INJECTION 3.5 MG (2.5MG/ML)
1.3000 mg/m2 | Freq: Once | INTRAMUSCULAR | Status: AC
  Administered 2020-01-31: 2.25 mg via SUBCUTANEOUS
  Filled 2020-01-31: qty 0.9

## 2020-01-31 MED ORDER — DEXAMETHASONE 4 MG PO TABS
10.0000 mg | ORAL_TABLET | Freq: Once | ORAL | Status: AC
  Administered 2020-01-31: 10 mg via ORAL
  Filled 2020-01-31: qty 3

## 2020-01-31 NOTE — Patient Instructions (Signed)
[facility] Discharge Instructions for Patients Receiving Chemotherapy  Today you received the following chemotherapy agents   To help prevent nausea and vomiting after your treatment, we encourage you to take your nausea medication   If you develop nausea and vomiting that is not controlled by your nausea medication, call the clinic.   BELOW ARE SYMPTOMS THAT SHOULD BE REPORTED IMMEDIATELY:  *FEVER GREATER THAN 100.5 F  *CHILLS WITH OR WITHOUT FEVER  NAUSEA AND VOMITING THAT IS NOT CONTROLLED WITH YOUR NAUSEA MEDICATION  *UNUSUAL SHORTNESS OF BREATH  *UNUSUAL BRUISING OR BLEEDING  TENDERNESS IN MOUTH AND THROAT WITH OR WITHOUT PRESENCE OF ULCERS  *URINARY PROBLEMS  *BOWEL PROBLEMS  UNUSUAL RASH Items with * indicate a potential emergency and should be followed up as soon as possible.  Feel free to call the clinic should you have any questions or concerns. The clinic phone number is [phone].  Please show the CHEMO ALERT CARD at check-in to the Emergency Department and triage nurse.   

## 2020-01-31 NOTE — Progress Notes (Signed)
Labs reviewed and meet parameters for treatment today.   Treatment given per orders. Patient tolerated it well without problems. Vitals stable and discharged home from clinic ambulatory. Follow up as scheduled.

## 2020-02-14 ENCOUNTER — Other Ambulatory Visit (HOSPITAL_COMMUNITY): Payer: Self-pay | Admitting: Nurse Practitioner

## 2020-02-14 DIAGNOSIS — C9000 Multiple myeloma not having achieved remission: Secondary | ICD-10-CM

## 2020-02-15 ENCOUNTER — Ambulatory Visit (HOSPITAL_COMMUNITY): Payer: Medicare PPO

## 2020-02-15 ENCOUNTER — Other Ambulatory Visit (HOSPITAL_COMMUNITY): Payer: Medicare PPO

## 2020-02-16 ENCOUNTER — Inpatient Hospital Stay (HOSPITAL_COMMUNITY): Payer: Medicare PPO

## 2020-02-16 ENCOUNTER — Ambulatory Visit (HOSPITAL_COMMUNITY): Payer: Medicare PPO

## 2020-02-16 ENCOUNTER — Other Ambulatory Visit (HOSPITAL_COMMUNITY): Payer: Self-pay | Admitting: *Deleted

## 2020-02-16 DIAGNOSIS — C9000 Multiple myeloma not having achieved remission: Secondary | ICD-10-CM

## 2020-02-16 MED ORDER — LENALIDOMIDE 10 MG PO CAPS: ORAL_CAPSULE | ORAL | 0 refills | Status: DC

## 2020-02-21 ENCOUNTER — Inpatient Hospital Stay (HOSPITAL_COMMUNITY): Payer: Medicare PPO

## 2020-02-21 ENCOUNTER — Other Ambulatory Visit: Payer: Self-pay

## 2020-02-21 ENCOUNTER — Inpatient Hospital Stay (HOSPITAL_COMMUNITY): Payer: Medicare PPO | Attending: Hematology

## 2020-02-21 ENCOUNTER — Encounter (HOSPITAL_COMMUNITY): Payer: Self-pay

## 2020-02-21 VITALS — BP 119/62 | HR 63 | Temp 96.9°F | Resp 18

## 2020-02-21 DIAGNOSIS — C9000 Multiple myeloma not having achieved remission: Secondary | ICD-10-CM | POA: Insufficient documentation

## 2020-02-21 DIAGNOSIS — Z5111 Encounter for antineoplastic chemotherapy: Secondary | ICD-10-CM | POA: Insufficient documentation

## 2020-02-21 LAB — CBC WITH DIFFERENTIAL/PLATELET
Abs Immature Granulocytes: 0 10*3/uL (ref 0.00–0.07)
Basophils Absolute: 0.1 10*3/uL (ref 0.0–0.1)
Basophils Relative: 2 %
Eosinophils Absolute: 0.1 10*3/uL (ref 0.0–0.5)
Eosinophils Relative: 5 %
HCT: 30.3 % — ABNORMAL LOW (ref 36.0–46.0)
Hemoglobin: 9.7 g/dL — ABNORMAL LOW (ref 12.0–15.0)
Immature Granulocytes: 0 %
Lymphocytes Relative: 24 %
Lymphs Abs: 0.7 10*3/uL (ref 0.7–4.0)
MCH: 32.6 pg (ref 26.0–34.0)
MCHC: 32 g/dL (ref 30.0–36.0)
MCV: 101.7 fL — ABNORMAL HIGH (ref 80.0–100.0)
Monocytes Absolute: 0.4 10*3/uL (ref 0.1–1.0)
Monocytes Relative: 14 %
Neutro Abs: 1.6 10*3/uL — ABNORMAL LOW (ref 1.7–7.7)
Neutrophils Relative %: 55 %
Platelets: 105 10*3/uL — ABNORMAL LOW (ref 150–400)
RBC: 2.98 MIL/uL — ABNORMAL LOW (ref 3.87–5.11)
RDW: 17.9 % — ABNORMAL HIGH (ref 11.5–15.5)
WBC: 2.9 10*3/uL — ABNORMAL LOW (ref 4.0–10.5)
nRBC: 0 % (ref 0.0–0.2)

## 2020-02-21 LAB — COMPREHENSIVE METABOLIC PANEL
ALT: 23 U/L (ref 0–44)
AST: 33 U/L (ref 15–41)
Albumin: 3.3 g/dL — ABNORMAL LOW (ref 3.5–5.0)
Alkaline Phosphatase: 50 U/L (ref 38–126)
Anion gap: 7 (ref 5–15)
BUN: 18 mg/dL (ref 8–23)
CO2: 27 mmol/L (ref 22–32)
Calcium: 9.1 mg/dL (ref 8.9–10.3)
Chloride: 107 mmol/L (ref 98–111)
Creatinine, Ser: 1.28 mg/dL — ABNORMAL HIGH (ref 0.44–1.00)
GFR calc Af Amer: 47 mL/min — ABNORMAL LOW (ref >60)
GFR calc non Af Amer: 41 mL/min — ABNORMAL LOW (ref >60)
Glucose, Bld: 93 mg/dL (ref 70–99)
Potassium: 3.9 mmol/L (ref 3.5–5.1)
Sodium: 141 mmol/L (ref 135–145)
Total Bilirubin: 0.7 mg/dL (ref 0.3–1.2)
Total Protein: 6.7 g/dL (ref 6.5–8.1)

## 2020-02-21 LAB — LACTATE DEHYDROGENASE: LDH: 171 U/L (ref 98–192)

## 2020-02-21 MED ORDER — BORTEZOMIB CHEMO SQ INJECTION 3.5 MG (2.5MG/ML)
1.3000 mg/m2 | Freq: Once | INTRAMUSCULAR | Status: AC
  Administered 2020-02-21: 2.25 mg via SUBCUTANEOUS
  Filled 2020-02-21: qty 0.9

## 2020-02-21 MED ORDER — DEXAMETHASONE 4 MG PO TABS
10.0000 mg | ORAL_TABLET | Freq: Once | ORAL | Status: AC
  Administered 2020-02-21: 15:00:00 10 mg via ORAL
  Filled 2020-02-21: qty 3

## 2020-02-21 NOTE — Progress Notes (Signed)
Patient tolerated Velcade injection with no complaints voiced.  Site clean and dry with no bruising or swelling noted at site.  Band aid applied.  Vss with discharge and left ambulatory with no s/s of distress noted.  

## 2020-02-22 ENCOUNTER — Ambulatory Visit (HOSPITAL_COMMUNITY): Payer: Medicare PPO | Admitting: Hematology

## 2020-02-22 ENCOUNTER — Ambulatory Visit (HOSPITAL_COMMUNITY): Payer: Medicare PPO

## 2020-02-22 ENCOUNTER — Other Ambulatory Visit (HOSPITAL_COMMUNITY): Payer: Medicare PPO

## 2020-02-22 LAB — PROTEIN ELECTROPHORESIS, SERUM
A/G Ratio: 1 (ref 0.7–1.7)
Albumin ELP: 3.1 g/dL (ref 2.9–4.4)
Alpha-1-Globulin: 0.3 g/dL (ref 0.0–0.4)
Alpha-2-Globulin: 0.7 g/dL (ref 0.4–1.0)
Beta Globulin: 0.7 g/dL (ref 0.7–1.3)
Gamma Globulin: 1.4 g/dL (ref 0.4–1.8)
Globulin, Total: 3.1 g/dL (ref 2.2–3.9)
M-Spike, %: 0.6 g/dL — ABNORMAL HIGH
Total Protein ELP: 6.2 g/dL (ref 6.0–8.5)

## 2020-02-22 LAB — KAPPA/LAMBDA LIGHT CHAINS
Kappa free light chain: 39.4 mg/L — ABNORMAL HIGH (ref 3.3–19.4)
Kappa, lambda light chain ratio: 0.91 (ref 0.26–1.65)
Lambda free light chains: 43.2 mg/L — ABNORMAL HIGH (ref 5.7–26.3)

## 2020-02-22 MED ORDER — FULVESTRANT 250 MG/5ML IM SOLN
INTRAMUSCULAR | Status: AC
  Filled 2020-02-22: qty 5

## 2020-02-22 MED ORDER — CYANOCOBALAMIN 1000 MCG/ML IJ SOLN
INTRAMUSCULAR | Status: AC
  Filled 2020-02-22: qty 1

## 2020-02-23 ENCOUNTER — Other Ambulatory Visit (HOSPITAL_COMMUNITY): Payer: Medicare PPO

## 2020-02-23 ENCOUNTER — Ambulatory Visit (HOSPITAL_COMMUNITY): Payer: Medicare PPO

## 2020-02-23 MED ORDER — FULVESTRANT 250 MG/5ML IM SOLN
INTRAMUSCULAR | Status: AC
  Filled 2020-02-23: qty 5

## 2020-02-23 MED ORDER — OCTREOTIDE ACETATE 20 MG IM KIT
PACK | INTRAMUSCULAR | Status: AC
  Filled 2020-02-23: qty 1

## 2020-02-23 MED ORDER — OCTREOTIDE ACETATE 30 MG IM KIT
PACK | INTRAMUSCULAR | Status: AC
  Filled 2020-02-23: qty 1

## 2020-02-28 ENCOUNTER — Other Ambulatory Visit: Payer: Self-pay

## 2020-02-28 ENCOUNTER — Inpatient Hospital Stay (HOSPITAL_COMMUNITY): Payer: Medicare PPO

## 2020-02-28 ENCOUNTER — Encounter (HOSPITAL_COMMUNITY): Payer: Self-pay

## 2020-02-28 VITALS — BP 135/83 | HR 69 | Temp 97.1°F | Resp 18

## 2020-02-28 DIAGNOSIS — C9000 Multiple myeloma not having achieved remission: Secondary | ICD-10-CM

## 2020-02-28 DIAGNOSIS — Z5111 Encounter for antineoplastic chemotherapy: Secondary | ICD-10-CM | POA: Diagnosis not present

## 2020-02-28 LAB — CBC WITH DIFFERENTIAL/PLATELET
Abs Immature Granulocytes: 0 10*3/uL (ref 0.00–0.07)
Basophils Absolute: 0 10*3/uL (ref 0.0–0.1)
Basophils Relative: 1 %
Eosinophils Absolute: 0.1 10*3/uL (ref 0.0–0.5)
Eosinophils Relative: 3 %
HCT: 30.1 % — ABNORMAL LOW (ref 36.0–46.0)
Hemoglobin: 9.7 g/dL — ABNORMAL LOW (ref 12.0–15.0)
Immature Granulocytes: 0 %
Lymphocytes Relative: 21 %
Lymphs Abs: 0.5 10*3/uL — ABNORMAL LOW (ref 0.7–4.0)
MCH: 32.8 pg (ref 26.0–34.0)
MCHC: 32.2 g/dL (ref 30.0–36.0)
MCV: 101.7 fL — ABNORMAL HIGH (ref 80.0–100.0)
Monocytes Absolute: 0.3 10*3/uL (ref 0.1–1.0)
Monocytes Relative: 13 %
Neutro Abs: 1.6 10*3/uL — ABNORMAL LOW (ref 1.7–7.7)
Neutrophils Relative %: 62 %
Platelets: 83 10*3/uL — ABNORMAL LOW (ref 150–400)
RBC: 2.96 MIL/uL — ABNORMAL LOW (ref 3.87–5.11)
RDW: 18.4 % — ABNORMAL HIGH (ref 11.5–15.5)
WBC: 2.5 10*3/uL — ABNORMAL LOW (ref 4.0–10.5)
nRBC: 0 % (ref 0.0–0.2)

## 2020-02-28 LAB — COMPREHENSIVE METABOLIC PANEL
ALT: 25 U/L (ref 0–44)
AST: 35 U/L (ref 15–41)
Albumin: 3.3 g/dL — ABNORMAL LOW (ref 3.5–5.0)
Alkaline Phosphatase: 46 U/L (ref 38–126)
Anion gap: 7 (ref 5–15)
BUN: 22 mg/dL (ref 8–23)
CO2: 27 mmol/L (ref 22–32)
Calcium: 9.1 mg/dL (ref 8.9–10.3)
Chloride: 108 mmol/L (ref 98–111)
Creatinine, Ser: 1.22 mg/dL — ABNORMAL HIGH (ref 0.44–1.00)
GFR calc Af Amer: 50 mL/min — ABNORMAL LOW (ref >60)
GFR calc non Af Amer: 43 mL/min — ABNORMAL LOW (ref >60)
Glucose, Bld: 107 mg/dL — ABNORMAL HIGH (ref 70–99)
Potassium: 4.2 mmol/L (ref 3.5–5.1)
Sodium: 142 mmol/L (ref 135–145)
Total Bilirubin: 0.6 mg/dL (ref 0.3–1.2)
Total Protein: 6.5 g/dL (ref 6.5–8.1)

## 2020-02-28 MED ORDER — BORTEZOMIB CHEMO SQ INJECTION 3.5 MG (2.5MG/ML)
1.3000 mg/m2 | Freq: Once | INTRAMUSCULAR | Status: AC
  Administered 2020-02-28: 2.25 mg via SUBCUTANEOUS
  Filled 2020-02-28: qty 0.9

## 2020-02-28 MED ORDER — DEXAMETHASONE 4 MG PO TABS
10.0000 mg | ORAL_TABLET | Freq: Once | ORAL | Status: AC
  Administered 2020-02-28: 10 mg via ORAL
  Filled 2020-02-28: qty 3

## 2020-02-28 NOTE — Patient Instructions (Signed)
[facility] Discharge Instructions for Patients Receiving Chemotherapy  Today you received the following chemotherapy agents   To help prevent nausea and vomiting after your treatment, we encourage you to take your nausea medication   If you develop nausea and vomiting that is not controlled by your nausea medication, call the clinic.   BELOW ARE SYMPTOMS THAT SHOULD BE REPORTED IMMEDIATELY:  *FEVER GREATER THAN 100.5 F  *CHILLS WITH OR WITHOUT FEVER  NAUSEA AND VOMITING THAT IS NOT CONTROLLED WITH YOUR NAUSEA MEDICATION  *UNUSUAL SHORTNESS OF BREATH  *UNUSUAL BRUISING OR BLEEDING  TENDERNESS IN MOUTH AND THROAT WITH OR WITHOUT PRESENCE OF ULCERS  *URINARY PROBLEMS  *BOWEL PROBLEMS  UNUSUAL RASH Items with * indicate a potential emergency and should be followed up as soon as possible.  Feel free to call the clinic should you have any questions or concerns. The clinic phone number is [phone].  Please show the CHEMO ALERT CARD at check-in to the Emergency Department and triage nurse.   

## 2020-02-28 NOTE — Progress Notes (Signed)
Labs meet parameters for treatment today per Dr. Delton Coombes. Ok to proceed.   Treatment given per orders. Patient tolerated it well without problems. Vitals stable and discharged home from clinic ambulatory. Follow up as scheduled.

## 2020-02-29 ENCOUNTER — Other Ambulatory Visit (HOSPITAL_COMMUNITY): Payer: Medicare PPO

## 2020-02-29 ENCOUNTER — Ambulatory Visit (HOSPITAL_COMMUNITY): Payer: Medicare PPO

## 2020-02-29 MED ORDER — FULVESTRANT 250 MG/5ML IM SOLN
INTRAMUSCULAR | Status: AC
  Filled 2020-02-29: qty 5

## 2020-03-01 ENCOUNTER — Ambulatory Visit (HOSPITAL_COMMUNITY): Payer: Medicare PPO | Admitting: Hematology

## 2020-03-01 ENCOUNTER — Other Ambulatory Visit (HOSPITAL_COMMUNITY): Payer: Medicare PPO

## 2020-03-01 ENCOUNTER — Ambulatory Visit (HOSPITAL_COMMUNITY): Payer: Medicare PPO

## 2020-03-06 ENCOUNTER — Other Ambulatory Visit: Payer: Self-pay

## 2020-03-06 ENCOUNTER — Inpatient Hospital Stay (HOSPITAL_COMMUNITY): Payer: Medicare PPO

## 2020-03-06 VITALS — BP 107/49 | HR 77 | Temp 96.8°F | Resp 18 | Wt 118.0 lb

## 2020-03-06 DIAGNOSIS — Z5111 Encounter for antineoplastic chemotherapy: Secondary | ICD-10-CM | POA: Diagnosis not present

## 2020-03-06 DIAGNOSIS — C9000 Multiple myeloma not having achieved remission: Secondary | ICD-10-CM | POA: Diagnosis not present

## 2020-03-06 LAB — COMPREHENSIVE METABOLIC PANEL
ALT: 26 U/L (ref 0–44)
AST: 36 U/L (ref 15–41)
Albumin: 3.3 g/dL — ABNORMAL LOW (ref 3.5–5.0)
Alkaline Phosphatase: 46 U/L (ref 38–126)
Anion gap: 8 (ref 5–15)
BUN: 22 mg/dL (ref 8–23)
CO2: 24 mmol/L (ref 22–32)
Calcium: 8.9 mg/dL (ref 8.9–10.3)
Chloride: 108 mmol/L (ref 98–111)
Creatinine, Ser: 1.2 mg/dL — ABNORMAL HIGH (ref 0.44–1.00)
GFR calc Af Amer: 51 mL/min — ABNORMAL LOW (ref >60)
GFR calc non Af Amer: 44 mL/min — ABNORMAL LOW (ref >60)
Glucose, Bld: 139 mg/dL — ABNORMAL HIGH (ref 70–99)
Potassium: 4.4 mmol/L (ref 3.5–5.1)
Sodium: 140 mmol/L (ref 135–145)
Total Bilirubin: 0.6 mg/dL (ref 0.3–1.2)
Total Protein: 6.6 g/dL (ref 6.5–8.1)

## 2020-03-06 LAB — CBC WITH DIFFERENTIAL/PLATELET
Abs Immature Granulocytes: 0.01 10*3/uL (ref 0.00–0.07)
Basophils Absolute: 0 10*3/uL (ref 0.0–0.1)
Basophils Relative: 1 %
Eosinophils Absolute: 0.2 10*3/uL (ref 0.0–0.5)
Eosinophils Relative: 5 %
HCT: 31.8 % — ABNORMAL LOW (ref 36.0–46.0)
Hemoglobin: 10.3 g/dL — ABNORMAL LOW (ref 12.0–15.0)
Immature Granulocytes: 0 %
Lymphocytes Relative: 18 %
Lymphs Abs: 0.6 10*3/uL — ABNORMAL LOW (ref 0.7–4.0)
MCH: 33.1 pg (ref 26.0–34.0)
MCHC: 32.4 g/dL (ref 30.0–36.0)
MCV: 102.3 fL — ABNORMAL HIGH (ref 80.0–100.0)
Monocytes Absolute: 0.2 10*3/uL (ref 0.1–1.0)
Monocytes Relative: 6 %
Neutro Abs: 2.4 10*3/uL (ref 1.7–7.7)
Neutrophils Relative %: 70 %
Platelets: 89 10*3/uL — ABNORMAL LOW (ref 150–400)
RBC: 3.11 MIL/uL — ABNORMAL LOW (ref 3.87–5.11)
RDW: 18.5 % — ABNORMAL HIGH (ref 11.5–15.5)
WBC: 3.4 10*3/uL — ABNORMAL LOW (ref 4.0–10.5)
nRBC: 0 % (ref 0.0–0.2)

## 2020-03-06 MED ORDER — BORTEZOMIB CHEMO SQ INJECTION 3.5 MG (2.5MG/ML)
1.3000 mg/m2 | Freq: Once | INTRAMUSCULAR | Status: AC
  Administered 2020-03-06: 2.25 mg via SUBCUTANEOUS
  Filled 2020-03-06: qty 0.9

## 2020-03-06 MED ORDER — DEXAMETHASONE 4 MG PO TABS
10.0000 mg | ORAL_TABLET | Freq: Once | ORAL | Status: AC
  Administered 2020-03-06: 10 mg via ORAL
  Filled 2020-03-06: qty 3

## 2020-03-06 NOTE — Progress Notes (Signed)
Patient tolerated Velcade injection with no complaints voiced.  Lab work reviewed.  See MAR for details.  Injection site clean and dry with no bruising or swelling noted.  Band aid applied.  VSS.  Patient left ambulatory with no s/s of distress noted.  

## 2020-03-06 NOTE — Progress Notes (Signed)
03/06/20  OK to proceed with platelets of 89K per Dr Tomie China guidelines.  Henreitta Leber, PharmD

## 2020-03-12 ENCOUNTER — Other Ambulatory Visit (HOSPITAL_COMMUNITY): Payer: Self-pay | Admitting: *Deleted

## 2020-03-12 DIAGNOSIS — C9000 Multiple myeloma not having achieved remission: Secondary | ICD-10-CM

## 2020-03-15 ENCOUNTER — Other Ambulatory Visit (HOSPITAL_COMMUNITY): Payer: Self-pay | Admitting: Nurse Practitioner

## 2020-03-15 DIAGNOSIS — C9000 Multiple myeloma not having achieved remission: Secondary | ICD-10-CM

## 2020-03-20 ENCOUNTER — Encounter (HOSPITAL_COMMUNITY): Payer: Self-pay | Admitting: Hematology

## 2020-03-20 ENCOUNTER — Other Ambulatory Visit: Payer: Self-pay

## 2020-03-20 ENCOUNTER — Encounter (HOSPITAL_COMMUNITY): Payer: Self-pay

## 2020-03-20 ENCOUNTER — Inpatient Hospital Stay (HOSPITAL_COMMUNITY): Payer: Medicare PPO

## 2020-03-20 ENCOUNTER — Inpatient Hospital Stay (HOSPITAL_COMMUNITY): Payer: Medicare PPO | Admitting: Hematology

## 2020-03-20 ENCOUNTER — Inpatient Hospital Stay (HOSPITAL_COMMUNITY): Payer: Medicare PPO | Attending: Hematology

## 2020-03-20 VITALS — BP 96/44 | HR 63 | Temp 96.6°F | Resp 18 | Wt 112.1 lb

## 2020-03-20 DIAGNOSIS — Z905 Acquired absence of kidney: Secondary | ICD-10-CM | POA: Insufficient documentation

## 2020-03-20 DIAGNOSIS — D696 Thrombocytopenia, unspecified: Secondary | ICD-10-CM | POA: Diagnosis not present

## 2020-03-20 DIAGNOSIS — I10 Essential (primary) hypertension: Secondary | ICD-10-CM | POA: Diagnosis not present

## 2020-03-20 DIAGNOSIS — Z7952 Long term (current) use of systemic steroids: Secondary | ICD-10-CM | POA: Diagnosis not present

## 2020-03-20 DIAGNOSIS — F0390 Unspecified dementia without behavioral disturbance: Secondary | ICD-10-CM | POA: Insufficient documentation

## 2020-03-20 DIAGNOSIS — Z90710 Acquired absence of both cervix and uterus: Secondary | ICD-10-CM | POA: Diagnosis not present

## 2020-03-20 DIAGNOSIS — E119 Type 2 diabetes mellitus without complications: Secondary | ICD-10-CM | POA: Diagnosis not present

## 2020-03-20 DIAGNOSIS — E039 Hypothyroidism, unspecified: Secondary | ICD-10-CM | POA: Diagnosis not present

## 2020-03-20 DIAGNOSIS — E876 Hypokalemia: Secondary | ICD-10-CM | POA: Insufficient documentation

## 2020-03-20 DIAGNOSIS — Z79899 Other long term (current) drug therapy: Secondary | ICD-10-CM | POA: Diagnosis not present

## 2020-03-20 DIAGNOSIS — Z7982 Long term (current) use of aspirin: Secondary | ICD-10-CM | POA: Diagnosis not present

## 2020-03-20 DIAGNOSIS — C9000 Multiple myeloma not having achieved remission: Secondary | ICD-10-CM

## 2020-03-20 DIAGNOSIS — Z5112 Encounter for antineoplastic immunotherapy: Secondary | ICD-10-CM | POA: Insufficient documentation

## 2020-03-20 LAB — COMPREHENSIVE METABOLIC PANEL
ALT: 29 U/L (ref 0–44)
AST: 33 U/L (ref 15–41)
Albumin: 3.3 g/dL — ABNORMAL LOW (ref 3.5–5.0)
Alkaline Phosphatase: 51 U/L (ref 38–126)
Anion gap: 7 (ref 5–15)
BUN: 24 mg/dL — ABNORMAL HIGH (ref 8–23)
CO2: 25 mmol/L (ref 22–32)
Calcium: 9.5 mg/dL (ref 8.9–10.3)
Chloride: 109 mmol/L (ref 98–111)
Creatinine, Ser: 1.56 mg/dL — ABNORMAL HIGH (ref 0.44–1.00)
GFR calc Af Amer: 37 mL/min — ABNORMAL LOW (ref >60)
GFR calc non Af Amer: 32 mL/min — ABNORMAL LOW (ref >60)
Glucose, Bld: 133 mg/dL — ABNORMAL HIGH (ref 70–99)
Potassium: 4.7 mmol/L (ref 3.5–5.1)
Sodium: 141 mmol/L (ref 135–145)
Total Bilirubin: 0.5 mg/dL (ref 0.3–1.2)
Total Protein: 6.3 g/dL — ABNORMAL LOW (ref 6.5–8.1)

## 2020-03-20 LAB — CBC WITH DIFFERENTIAL/PLATELET
Abs Immature Granulocytes: 0.01 10*3/uL (ref 0.00–0.07)
Basophils Absolute: 0.1 10*3/uL (ref 0.0–0.1)
Basophils Relative: 1 %
Eosinophils Absolute: 0.1 10*3/uL (ref 0.0–0.5)
Eosinophils Relative: 4 %
HCT: 28.5 % — ABNORMAL LOW (ref 36.0–46.0)
Hemoglobin: 9.2 g/dL — ABNORMAL LOW (ref 12.0–15.0)
Immature Granulocytes: 0 %
Lymphocytes Relative: 20 %
Lymphs Abs: 0.7 10*3/uL (ref 0.7–4.0)
MCH: 33.1 pg (ref 26.0–34.0)
MCHC: 32.3 g/dL (ref 30.0–36.0)
MCV: 102.5 fL — ABNORMAL HIGH (ref 80.0–100.0)
Monocytes Absolute: 0.4 10*3/uL (ref 0.1–1.0)
Monocytes Relative: 11 %
Neutro Abs: 2.3 10*3/uL (ref 1.7–7.7)
Neutrophils Relative %: 64 %
Platelets: 114 10*3/uL — ABNORMAL LOW (ref 150–400)
RBC: 2.78 MIL/uL — ABNORMAL LOW (ref 3.87–5.11)
RDW: 17.5 % — ABNORMAL HIGH (ref 11.5–15.5)
WBC: 3.6 10*3/uL — ABNORMAL LOW (ref 4.0–10.5)
nRBC: 0 % (ref 0.0–0.2)

## 2020-03-20 LAB — LACTATE DEHYDROGENASE: LDH: 156 U/L (ref 98–192)

## 2020-03-20 LAB — MAGNESIUM: Magnesium: 2 mg/dL (ref 1.7–2.4)

## 2020-03-20 MED ORDER — DEXAMETHASONE 4 MG PO TABS
10.0000 mg | ORAL_TABLET | Freq: Once | ORAL | Status: AC
  Administered 2020-03-20: 12:00:00 10 mg via ORAL

## 2020-03-20 MED ORDER — BORTEZOMIB CHEMO SQ INJECTION 3.5 MG (2.5MG/ML)
1.3000 mg/m2 | Freq: Once | INTRAMUSCULAR | Status: AC
  Administered 2020-03-20: 2.25 mg via SUBCUTANEOUS
  Filled 2020-03-20: qty 0.9

## 2020-03-20 MED ORDER — DEXAMETHASONE 4 MG PO TABS
ORAL_TABLET | ORAL | Status: AC
  Filled 2020-03-20: qty 3

## 2020-03-20 NOTE — Patient Instructions (Addendum)
[facility] Discharge Instructions  You were seen today by Dr. Delton Coombes. He went over your recent lab results. You will get your Velcade injection today.  Dr. Delton Coombes wants you to decrease Losartan to 1/2 of a tablet daily due to elevated kidney function labs.  He also wants you to decrease Potassium tablets to 2 daily instead of 4 daily.  Continue taking iron supplement as you have been.  Dr. Delton Coombes recommends drinking Boost or Ensure daily.  He will see you back in 4 weeks for labs and follow up.   Thank you for choosing [place] [facility] to provide your oncology and hematology care.  To afford each patient quality time with our provider, please arrive at least 15 minutes before your scheduled appointment time.   If you have a lab appointment with the [facility] please come in thru the  Main Entrance and check in at the main information desk  You need to re-schedule your appointment should you arrive 10 or more minutes late.  We strive to give you quality time with our providers, and arriving late affects you and other patients whose appointments are after yours.  Also, if you no show three or more times for appointments you may be dismissed from the clinic at the providers discretion.     Again, thank you for choosing [facility].  Our hope is that these requests will decrease the amount of time that you wait before being seen by our physicians.       _____________________________________________________________  Should you have questions after your visit to [facility], please contact our office at [phone] between the hours of 8:00 a.m. and 4:30 p.m.  Voicemails left after 4:00 p.m. will not be returned until the following business day.  For prescription refill requests, have your pharmacy contact our office and allow 72 hours.    Cancer Center Support Programs:   > Cancer Support  Group  2nd Tuesday of the month 1pm-2pm, Journey Room

## 2020-03-20 NOTE — Progress Notes (Signed)
Amanda Wu, [place] [address]   CLINIC:  Medical Oncology/Hematology  PCP:  Monico Blitz, MD [place] [address] [phone]   REASON FOR VISIT:  Follow-up for Multiple Myeloma   CURRENT THERAPY: Rev/Velcade/Dex  BRIEF ONCOLOGIC HISTORY:  Oncology History  Multiple myeloma ([place])  07/09/2017 Initial Diagnosis   Multiple myeloma ([place])   06/01/2018 -  Chemotherapy   The patient had bortezomib SQ (VELCADE) chemo injection 1.75 mg, 1 mg/m2 = 1.75 mg (100 % of original dose 1 mg/m2), Subcutaneous,  Once, 24 of 25 cycles Dose modification: 1 mg/m2 (original dose 1 mg/m2, Cycle 1, Reason: Provider Judgment), 1.3 mg/m2 (original dose 1 mg/m2, Cycle 8, Reason: Provider Judgment) Administration: 1.75 mg (06/01/2018), 1.75 mg (06/08/2018), 1.75 mg (06/15/2018), 1.75 mg (06/29/2018), 1.75 mg (07/06/2018), 1.75 mg (07/13/2018), 1.75 mg (07/27/2018), 1.75 mg (08/03/2018), 1.75 mg (08/10/2018), 1.75 mg (08/24/2018), 1.75 mg (08/31/2018), 1.75 mg (09/07/2018), 1.75 mg (09/21/2018), 1.75 mg (09/28/2018), 1.75 mg (10/05/2018), 1.75 mg (10/19/2018), 1.75 mg (10/26/2018), 1.75 mg (11/02/2018), 1.75 mg (11/16/2018), 1.75 mg (11/23/2018), 1.75 mg (11/30/2018), 2.25 mg (12/17/2018), 2.25 mg (12/24/2018), 2.25 mg (01/04/2019), 2.25 mg (01/18/2019), 2.25 mg (01/25/2019), 2.25 mg (02/01/2019), 2.25 mg (02/15/2019), 2.25 mg (02/22/2019), 2.25 mg (03/01/2019), 2.25 mg (03/15/2019), 2.25 mg (03/23/2019), 2.25 mg (03/31/2019), 2.25 mg (04/12/2019), 2.25 mg (04/19/2019), 2.25 mg (04/26/2019), 2.25 mg (05/10/2019), 2.25 mg (05/17/2019), 2.25 mg (05/24/2019), 2.25 mg (06/07/2019), 2.25 mg (06/14/2019), 2.25 mg (06/21/2019), 2.25 mg (07/05/2019), 2.25 mg (07/12/2019), 2.25 mg (07/19/2019), 2.25 mg (08/02/2019), 2.25 mg (08/09/2019), 2.25 mg (08/16/2019), 2.25 mg (08/30/2019), 2.25 mg (09/06/2019), 2.25 mg (09/13/2019), 2.25 mg (09/27/2019), 2.25 mg (10/04/2019), 2.25 mg (10/11/2019), 2.25 mg (10/25/2019), 2.25 mg (11/08/2019), 2.25  mg (11/22/2019), 2.25 mg (11/29/2019), 2.25 mg (12/06/2019), 2.25 mg (12/20/2019), 2.25 mg (12/27/2019), 2.25 mg (01/03/2020), 2.25 mg (01/17/2020), 2.25 mg (01/24/2020), 2.25 mg (01/31/2020), 2.25 mg (02/21/2020), 2.25 mg (02/28/2020), 2.25 mg (03/06/2020), 2.25 mg (03/20/2020)  for chemotherapy treatment.        INTERVAL HISTORY:  Amanda Wu 74 y.o. female seen for follow-up and toxicity assessment prior to next treatment for myeloma.  She stopped drinking Ensure at bedtime.  She lost about 5 pounds since last visit.  Otherwise she is tolerating Velcade and Revlimid very well.  Denies any lightheadedness or chest pains.  Appetite and energy levels are rated at 25%.  Continues to have struggles with memory.  REVIEW OF SYSTEMS:  Review of Systems  All other systems reviewed and are negative.    PAST MEDICAL/SURGICAL HISTORY:  Past Medical History:  Diagnosis Date  . Benign hypertension   . Cataract   . 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 Diabetes mellitus without complication ([place])   . H/O partial nephrectomy   . Hypokalemia   . Hypothyroidism   . Impaired cognition   . Multiple myeloma (HCC)    Dr Maylon Peppers, [facility]  . Thyroid disease    Past Surgical History:  Procedure Laterality Date  . ABDOMINAL HYSTERECTOMY    . CRANIOTOMY     for lymphoma  . YAG LASER APPLICATION Right 9/32/3557   Procedure: YAG LASER APPLICATION;  Surgeon: [name], MD;  Location: [facility];  Service: Ophthalmology;  Laterality: Right;     SOCIAL HISTORY:  Social History   Socioeconomic History  . Marital status: Single    Spouse name: Not on file  . Number of children: Not on file  . Years of education: Not on file  .  Highest education level: Not on file  Occupational History  . Not on file  Tobacco Use  . Smoking status: Never Smoker  . Smokeless tobacco: Never Used  Substance and Sexual Activity  . Alcohol use: No  . Drug use: No  . Sexual activity: Not on file  Other Topics Concern  . Not  on file  Social History Narrative  . Not on file   Social Determinants of Health   Financial Resource Strain:   . Difficulty of Paying Living Expenses:   Food Insecurity:   . Worried About Charity fundraiser in the Last Year:   . Arboriculturist in the Last Year:   Transportation Needs:   . Film/video editor (Medical):   Marland Kitchen Lack of Transportation (Non-Medical):   Physical Activity:   . Days of Exercise per Week:   . Minutes of Exercise per Session:   Stress:   . Feeling of Stress :   Social Connections:   . Frequency of Communication with Friends and Family:   . Frequency of Social Gatherings with Friends and Family:   . Attends Religious Services:   . Active Member of Clubs or Organizations:   . Attends Archivist Meetings:   Marland Kitchen Marital Status:   Intimate Partner Violence:   . Fear of Current or Ex-Partner:   . Emotionally Abused:   Marland Kitchen Physically Abused:   . Sexually Abused:     FAMILY HISTORY:  Family History  Problem Relation Age of Onset  . Depression Mother   . Diabetes Mother   . Heart disease Father   . Cancer - Prostate Brother   . Cancer Brother   . Cancer Sister     CURRENT MEDICATIONS:  Outpatient Encounter Medications as of 03/20/2020  Medication Sig Note  . ACCU-CHEK SMARTVIEW test strip    . Accu-Chek Softclix Lancets lancets    . acetaminophen (TYLENOL) 500 MG tablet Take 1,000 mg by mouth every 6 (six) hours as needed for mild pain.    Marland Kitchen acyclovir (ZOVIRAX) 400 MG tablet TAKE 1 TABLET BY MOUTH TWICE DAILY   . amLODipine (NORVASC) 5 MG tablet    . aspirin EC 81 MG tablet Take 81 mg by mouth daily.   . bortezomib IV (VELCADE) 3.5 MG injection Inject into the vein.   . calcium-vitamin D (OSCAL WITH D) 500-200 MG-UNIT tablet Take 1 tablet by mouth daily with breakfast.   . dexamethasone (DECADRON) 4 MG tablet TAKE TWO AND ONE-HALF TABLETS BY MOUTH WEEKLY FOR THREE WEEKS (THREE WEEKS ON, ONE WEEK OFF) IN A ROW AND REPEAT MONTHLY 06/22/2018:  Patient states that she is given the dexamethasone while here getting her velcade injection  . donepezil (ARICEPT) 10 MG tablet TAKE 1 TABLET BY MOUTH every evening   . ferrous sulfate 325 (65 FE) MG tablet Take 325 mg by mouth daily with breakfast.   . lenalidomide (REVLIMID) 10 MG capsule TAKE 1 CAPSULE BY MOUTH  DAILY FOR 14 DAYS ON THEN  14 DAYS OFF   . levothyroxine (SYNTHROID, LEVOTHROID) 25 MCG tablet Take 25 mcg by mouth daily before breakfast.   . losartan (COZAAR) 100 MG tablet Take 100 mg by mouth daily.   . Multiple Vitamins-Minerals (MULTIVITAMIN WOMEN 50+ PO) Take by mouth.   . potassium chloride (KLOR-CON) 10 MEQ tablet TAKE THREE TABLETS BY MOUTH EVERY DAY    Facility-Administered Encounter Medications as of 03/20/2020  Medication  . prochlorperazine (COMPAZINE) tablet 10 mg  ALLERGIES:  Allergies  Allergen Reactions  . Lotensin [Benazepril Hcl]     Facial swelling (Angioedema) Note: ARBs should also be contraindicated, but she is on Losartan!     PHYSICAL EXAM:  ECOG Performance status: 1  Vitals:   03/20/20 1100  BP: (!) 96/44  Pulse: 63  Resp: 18  Temp: (!) 96.6 F (35.9 C)  SpO2: 100%   Filed Weights   03/20/20 1100  Weight: 112 lb 1 oz (50.8 kg)    Physical Exam Vitals reviewed.  Constitutional:      Appearance: Normal appearance.  HENT:     Head: Normocephalic.     Nose: Nose normal.     Mouth/Throat:     Mouth: Mucous membranes are moist.     Pharynx: Oropharynx is clear.  Eyes:     Extraocular Movements: Extraocular movements intact.     Conjunctiva/sclera: Conjunctivae normal.  Cardiovascular:     Rate and Rhythm: Normal rate and regular rhythm.     Pulses: Normal pulses.     Heart sounds: Normal heart sounds.  Pulmonary:     Effort: Pulmonary effort is normal.     Breath sounds: Normal breath sounds.  Abdominal:     General: Bowel sounds are normal.     Palpations: Abdomen is soft.  Musculoskeletal:        General: Normal range  of motion.     Cervical back: Normal range of motion.  Skin:    General: Skin is warm and dry.  Neurological:     Mental Status: She is alert. Mental status is at baseline.  Psychiatric:        Mood and Affect: Mood normal.        Behavior: Behavior normal.      LABORATORY DATA:  I have reviewed the labs as listed.  CBC    Component Value Date/Time   WBC 3.6 (L) 03/20/2020 1012   RBC 2.78 (L) 03/20/2020 1012   HGB 9.2 (L) 03/20/2020 1012   HCT 28.5 (L) 03/20/2020 1012   PLT 114 (L) 03/20/2020 1012   MCV 102.5 (H) 03/20/2020 1012   MCH 33.1 03/20/2020 1012   MCHC 32.3 03/20/2020 1012   RDW 17.5 (H) 03/20/2020 1012   LYMPHSABS 0.7 03/20/2020 1012   MONOABS 0.4 03/20/2020 1012   EOSABS 0.1 03/20/2020 1012   BASOSABS 0.1 03/20/2020 1012   CMP Latest Ref Rng & Units 03/20/2020 03/06/2020 02/28/2020  Glucose 70 - 99 mg/dL 133(H) 139(H) 107(H)  BUN 8 - 23 mg/dL 24(H) 22 22  Creatinine 0.44 - 1.00 mg/dL 1.56(H) 1.20(H) 1.22(H)  Sodium 135 - 145 mmol/L 141 140 142  Potassium 3.5 - 5.1 mmol/L 4.7 4.4 4.2  Chloride 98 - 111 mmol/L 109 108 108  CO2 22 - 32 mmol/L _0 Calcium 8.9 - 10.3 mg/dL 9.5 8.9 9.1  Total Protein 6.5 - 8.1 g/dL 6.3(L) 6.6 6.5  Total Bilirubin 0.3 - 1.2 mg/dL 0.5 0.6 0.6  Alkaline Phos 38 - 126 U/L 51 46 46  AST 15 - 41 U/L 33 36 35  ALT 0 - 44 U/L _1 I have reviewed scans.     ASSESSMENT & PLAN:   Multiple myeloma (HCC) 1.  IgG lambda multiple myeloma: -She is on Revlimid 10 mg 2 weeks on/2 weeks off and Velcade 3 weeks on and 1 week off.  She takes 10 mg of dexamethasone on days of Velcade. -We reviewed myeloma labs from  02/21/2020.  M spike is slightly better at 0.6 g, previously 0.7 g.  Free light chain ratio is 0.91.  Kappa light chains are 39.4. -CBC showed white count 3.6 with hemoglobin 9.2. -He lost about 5 pounds since last visit.  She reportedly stopped drinking Ensure at bedtime as it causes her to have diarrhea. -She will  continue Velcade and Revlimid.  We will reevaluate her in 4 weeks.  2.  Elevated creatinine: -Creatinine went up to 1.56 from 1.2 previously. -Blood pressure today is 96/44.  Amlodipine is already on hold. -I will cut back on losartan to 50 mg daily.  3.  Hypokalemia: -Potassium today is 4.7.  She is taking potassium 4 pills/day.  We will cut it back to 2 pills/day.  4.  Dementia: -She had a whole brain radiation for CNS more like lymphoma.  Lately her dementia has been progressively getting worse.  She is on Aricept 10 mg daily.  Her sister will likely unable to take care of her son.  She will require placement in a nursing home.  We will have to refer to social work.  5.  Shingles prophylaxis: -We will continue acyclovir 400 mg twice daily for shingles prophylaxis.  6.  Thrombocytopenia: -He has moderate thrombocytopenia with platelet count ranging around 90,000.  Likely from myelosuppression from treatments.  Today platelet count is 114.  7.  Bone strengthening: -She was treated with Zometa in the past for several years.  We have discontinued it.     Orders placed this encounter:  Orders Placed This Encounter  Procedures  . Lactate dehydrogenase  . Protein electrophoresis, serum  . Kappa/lambda light chains  . CBC with Differential/Platelet  . Comprehensive metabolic panel  . Lactate dehydrogenase      Derek Jack, MD [place] [phone]

## 2020-03-20 NOTE — Progress Notes (Signed)
Message received from [facility] LPN/ Dr. Genene Churn. Proceed with injection. Labs reviewed.   Velcade  given today per MD orders. Tolerated without adverse affects. Vital signs stable. No complaints at this time. Discharged from clinic ambulatory. F/U with [facility] as scheduled.

## 2020-03-20 NOTE — Patient Instructions (Signed)
[facility] Discharge Instructions for Patients Receiving Chemotherapy  Today you received the following chemotherapy agents   To help prevent nausea and vomiting after your treatment, we encourage you to take your nausea medication   If you develop nausea and vomiting that is not controlled by your nausea medication, call the clinic.   BELOW ARE SYMPTOMS THAT SHOULD BE REPORTED IMMEDIATELY:  *FEVER GREATER THAN 100.5 F  *CHILLS WITH OR WITHOUT FEVER  NAUSEA AND VOMITING THAT IS NOT CONTROLLED WITH YOUR NAUSEA MEDICATION  *UNUSUAL SHORTNESS OF BREATH  *UNUSUAL BRUISING OR BLEEDING  TENDERNESS IN MOUTH AND THROAT WITH OR WITHOUT PRESENCE OF ULCERS  *URINARY PROBLEMS  *BOWEL PROBLEMS  UNUSUAL RASH Items with * indicate a potential emergency and should be followed up as soon as possible.  Feel free to call the clinic should you have any questions or concerns. The clinic phone number is [phone].  Please show the CHEMO ALERT CARD at check-in to the Emergency Department and triage nurse.   

## 2020-03-20 NOTE — Assessment & Plan Note (Addendum)
1.  IgG lambda multiple myeloma: -She is on Revlimid 10 mg 2 weeks on/2 weeks off and Velcade 3 weeks on and 1 week off.  She takes 10 mg of dexamethasone on days of Velcade. -We reviewed myeloma labs from 02/21/2020.  M spike is slightly better at 0.6 g, previously 0.7 g.  Free light chain ratio is 0.91.  Kappa light chains are 39.4. -CBC showed white count 3.6 with hemoglobin 9.2. -He lost about 5 pounds since last visit.  She reportedly stopped drinking Ensure at bedtime as it causes her to have diarrhea. -She will continue Velcade and Revlimid.  We will reevaluate her in 4 weeks.  2.  Elevated creatinine: -Creatinine went up to 1.56 from 1.2 previously. -Blood pressure today is 96/44.  Amlodipine is already on hold. -I will cut back on losartan to 50 mg daily.  3.  Hypokalemia: -Potassium today is 4.7.  She is taking potassium 4 pills/day.  We will cut it back to 2 pills/day.  4.  Dementia: -She had a whole brain radiation for CNS more like lymphoma.  Lately her dementia has been progressively getting worse.  She is on Aricept 10 mg daily.  Her sister will likely unable to take care of her son.  She will require placement in a nursing home.  We will have to refer to social work.  5.  Shingles prophylaxis: -We will continue acyclovir 400 mg twice daily for shingles prophylaxis.  6.  Thrombocytopenia: -He has moderate thrombocytopenia with platelet count ranging around 90,000.  Likely from myelosuppression from treatments.  Today platelet count is 114.  7.  Bone strengthening: -She was treated with Zometa in the past for several years.  We have discontinued it.

## 2020-03-21 LAB — PROTEIN ELECTROPHORESIS, SERUM
A/G Ratio: 1 (ref 0.7–1.7)
Albumin ELP: 2.9 g/dL (ref 2.9–4.4)
Alpha-1-Globulin: 0.2 g/dL (ref 0.0–0.4)
Alpha-2-Globulin: 0.7 g/dL (ref 0.4–1.0)
Beta Globulin: 0.7 g/dL (ref 0.7–1.3)
Gamma Globulin: 1.3 g/dL (ref 0.4–1.8)
Globulin, Total: 2.9 g/dL (ref 2.2–3.9)
M-Spike, %: 0.6 g/dL — ABNORMAL HIGH
Total Protein ELP: 5.8 g/dL — ABNORMAL LOW (ref 6.0–8.5)

## 2020-03-21 LAB — KAPPA/LAMBDA LIGHT CHAINS
Kappa free light chain: 43.9 mg/L — ABNORMAL HIGH (ref 3.3–19.4)
Kappa, lambda light chain ratio: 1.11 (ref 0.26–1.65)
Lambda free light chains: 39.5 mg/L — ABNORMAL HIGH (ref 5.7–26.3)

## 2020-03-21 NOTE — Progress Notes (Signed)

## 2020-03-26 ENCOUNTER — Other Ambulatory Visit (HOSPITAL_COMMUNITY): Payer: Self-pay | Admitting: *Deleted

## 2020-03-27 ENCOUNTER — Other Ambulatory Visit: Payer: Self-pay

## 2020-03-27 ENCOUNTER — Inpatient Hospital Stay (HOSPITAL_COMMUNITY): Payer: Medicare PPO

## 2020-03-27 VITALS — BP 112/62 | HR 64 | Temp 96.9°F | Resp 18 | Wt 107.0 lb

## 2020-03-27 DIAGNOSIS — F0390 Unspecified dementia without behavioral disturbance: Secondary | ICD-10-CM | POA: Diagnosis not present

## 2020-03-27 DIAGNOSIS — Z5112 Encounter for antineoplastic immunotherapy: Secondary | ICD-10-CM | POA: Diagnosis not present

## 2020-03-27 DIAGNOSIS — C9000 Multiple myeloma not having achieved remission: Secondary | ICD-10-CM

## 2020-03-27 DIAGNOSIS — E039 Hypothyroidism, unspecified: Secondary | ICD-10-CM | POA: Diagnosis not present

## 2020-03-27 DIAGNOSIS — Z7952 Long term (current) use of systemic steroids: Secondary | ICD-10-CM | POA: Diagnosis not present

## 2020-03-27 DIAGNOSIS — E119 Type 2 diabetes mellitus without complications: Secondary | ICD-10-CM | POA: Diagnosis not present

## 2020-03-27 DIAGNOSIS — D696 Thrombocytopenia, unspecified: Secondary | ICD-10-CM | POA: Diagnosis not present

## 2020-03-27 DIAGNOSIS — E876 Hypokalemia: Secondary | ICD-10-CM | POA: Diagnosis not present

## 2020-03-27 DIAGNOSIS — I10 Essential (primary) hypertension: Secondary | ICD-10-CM | POA: Diagnosis not present

## 2020-03-27 LAB — COMPREHENSIVE METABOLIC PANEL
ALT: 29 U/L (ref 0–44)
AST: 33 U/L (ref 15–41)
Albumin: 3.2 g/dL — ABNORMAL LOW (ref 3.5–5.0)
Alkaline Phosphatase: 53 U/L (ref 38–126)
Anion gap: 6 (ref 5–15)
BUN: 30 mg/dL — ABNORMAL HIGH (ref 8–23)
CO2: 23 mmol/L (ref 22–32)
Calcium: 9.2 mg/dL (ref 8.9–10.3)
Chloride: 107 mmol/L (ref 98–111)
Creatinine, Ser: 1.33 mg/dL — ABNORMAL HIGH (ref 0.44–1.00)
GFR calc Af Amer: 45 mL/min — ABNORMAL LOW (ref >60)
GFR calc non Af Amer: 39 mL/min — ABNORMAL LOW (ref >60)
Glucose, Bld: 87 mg/dL (ref 70–99)
Potassium: 4.9 mmol/L (ref 3.5–5.1)
Sodium: 136 mmol/L (ref 135–145)
Total Bilirubin: 0.7 mg/dL (ref 0.3–1.2)
Total Protein: 5.9 g/dL — ABNORMAL LOW (ref 6.5–8.1)

## 2020-03-27 LAB — CBC WITH DIFFERENTIAL/PLATELET
Abs Immature Granulocytes: 0.01 10*3/uL (ref 0.00–0.07)
Basophils Absolute: 0 10*3/uL (ref 0.0–0.1)
Basophils Relative: 1 %
Eosinophils Absolute: 0.1 10*3/uL (ref 0.0–0.5)
Eosinophils Relative: 3 %
HCT: 29 % — ABNORMAL LOW (ref 36.0–46.0)
Hemoglobin: 9.3 g/dL — ABNORMAL LOW (ref 12.0–15.0)
Immature Granulocytes: 0 %
Lymphocytes Relative: 21 %
Lymphs Abs: 0.6 10*3/uL — ABNORMAL LOW (ref 0.7–4.0)
MCH: 33 pg (ref 26.0–34.0)
MCHC: 32.1 g/dL (ref 30.0–36.0)
MCV: 102.8 fL — ABNORMAL HIGH (ref 80.0–100.0)
Monocytes Absolute: 0.4 10*3/uL (ref 0.1–1.0)
Monocytes Relative: 13 %
Neutro Abs: 1.8 10*3/uL (ref 1.7–7.7)
Neutrophils Relative %: 62 %
Platelets: 83 10*3/uL — ABNORMAL LOW (ref 150–400)
RBC: 2.82 MIL/uL — ABNORMAL LOW (ref 3.87–5.11)
RDW: 17.8 % — ABNORMAL HIGH (ref 11.5–15.5)
WBC: 2.9 10*3/uL — ABNORMAL LOW (ref 4.0–10.5)
nRBC: 0 % (ref 0.0–0.2)

## 2020-03-27 MED ORDER — BORTEZOMIB CHEMO SQ INJECTION 3.5 MG (2.5MG/ML)
1.3000 mg/m2 | Freq: Once | INTRAMUSCULAR | Status: AC
  Administered 2020-03-27: 2.25 mg via SUBCUTANEOUS
  Filled 2020-03-27: qty 0.9

## 2020-03-27 MED ORDER — DEXAMETHASONE 4 MG PO TABS
ORAL_TABLET | ORAL | Status: AC
  Filled 2020-03-27: qty 3

## 2020-03-27 MED ORDER — DEXAMETHASONE 4 MG PO TABS
10.0000 mg | ORAL_TABLET | Freq: Once | ORAL | Status: AC
  Administered 2020-03-27: 10 mg via ORAL

## 2020-03-27 NOTE — Progress Notes (Signed)
Patient presents today for Velcade injection. Vitals signs stable. MAR reviewed. Patient has no complaints of pain today. Patient states she has has some diarrhea. Imodium discussed with patient and the patient's sister. Understanding verbalized.   Labs reviewed by [facility] NP. Verbal order received to proceed with treatment.   Treatment given today per MD orders. Tolerated  without adverse affects. Vital signs stable. No complaints at this time. Discharged from clinic via wheel chair.  F/U with [facility] as scheduled.

## 2020-03-27 NOTE — Patient Instructions (Signed)
[facility] Discharge Instructions for Patients Receiving Chemotherapy  Today you received the following chemotherapy agents   To help prevent nausea and vomiting after your treatment, we encourage you to take your nausea medication   If you develop nausea and vomiting that is not controlled by your nausea medication, call the clinic.   BELOW ARE SYMPTOMS THAT SHOULD BE REPORTED IMMEDIATELY:  *FEVER GREATER THAN 100.5 F  *CHILLS WITH OR WITHOUT FEVER  NAUSEA AND VOMITING THAT IS NOT CONTROLLED WITH YOUR NAUSEA MEDICATION  *UNUSUAL SHORTNESS OF BREATH  *UNUSUAL BRUISING OR BLEEDING  TENDERNESS IN MOUTH AND THROAT WITH OR WITHOUT PRESENCE OF ULCERS  *URINARY PROBLEMS  *BOWEL PROBLEMS  UNUSUAL RASH Items with * indicate a potential emergency and should be followed up as soon as possible.  Feel free to call the clinic should you have any questions or concerns. The clinic phone number is [phone].  Please show the CHEMO ALERT CARD at check-in to the Emergency Department and triage nurse.   

## 2020-04-03 ENCOUNTER — Inpatient Hospital Stay (HOSPITAL_COMMUNITY): Payer: Medicare PPO

## 2020-04-03 ENCOUNTER — Inpatient Hospital Stay (HOSPITAL_COMMUNITY): Payer: Medicare PPO | Admitting: General Practice

## 2020-04-03 ENCOUNTER — Other Ambulatory Visit: Payer: Self-pay

## 2020-04-03 ENCOUNTER — Encounter (HOSPITAL_COMMUNITY): Payer: Self-pay | Admitting: General Practice

## 2020-04-03 ENCOUNTER — Encounter (HOSPITAL_COMMUNITY): Payer: Self-pay

## 2020-04-03 VITALS — BP 116/47 | HR 87 | Temp 97.9°F | Resp 18 | Wt 106.6 lb

## 2020-04-03 DIAGNOSIS — C9000 Multiple myeloma not having achieved remission: Secondary | ICD-10-CM | POA: Diagnosis not present

## 2020-04-03 DIAGNOSIS — Z5112 Encounter for antineoplastic immunotherapy: Secondary | ICD-10-CM | POA: Diagnosis not present

## 2020-04-03 DIAGNOSIS — I10 Essential (primary) hypertension: Secondary | ICD-10-CM | POA: Diagnosis not present

## 2020-04-03 DIAGNOSIS — Z7952 Long term (current) use of systemic steroids: Secondary | ICD-10-CM | POA: Diagnosis not present

## 2020-04-03 DIAGNOSIS — E876 Hypokalemia: Secondary | ICD-10-CM | POA: Diagnosis not present

## 2020-04-03 DIAGNOSIS — D696 Thrombocytopenia, unspecified: Secondary | ICD-10-CM | POA: Diagnosis not present

## 2020-04-03 DIAGNOSIS — F0390 Unspecified dementia without behavioral disturbance: Secondary | ICD-10-CM | POA: Diagnosis not present

## 2020-04-03 DIAGNOSIS — E119 Type 2 diabetes mellitus without complications: Secondary | ICD-10-CM | POA: Diagnosis not present

## 2020-04-03 DIAGNOSIS — E039 Hypothyroidism, unspecified: Secondary | ICD-10-CM | POA: Diagnosis not present

## 2020-04-03 LAB — CBC WITH DIFFERENTIAL/PLATELET
Abs Immature Granulocytes: 0.02 10*3/uL (ref 0.00–0.07)
Basophils Absolute: 0 10*3/uL (ref 0.0–0.1)
Basophils Relative: 1 %
Eosinophils Absolute: 0.2 10*3/uL (ref 0.0–0.5)
Eosinophils Relative: 4 %
HCT: 28.9 % — ABNORMAL LOW (ref 36.0–46.0)
Hemoglobin: 9.3 g/dL — ABNORMAL LOW (ref 12.0–15.0)
Immature Granulocytes: 1 %
Lymphocytes Relative: 15 %
Lymphs Abs: 0.6 10*3/uL — ABNORMAL LOW (ref 0.7–4.0)
MCH: 33 pg (ref 26.0–34.0)
MCHC: 32.2 g/dL (ref 30.0–36.0)
MCV: 102.5 fL — ABNORMAL HIGH (ref 80.0–100.0)
Monocytes Absolute: 0.2 10*3/uL (ref 0.1–1.0)
Monocytes Relative: 5 %
Neutro Abs: 3 10*3/uL (ref 1.7–7.7)
Neutrophils Relative %: 74 %
Platelets: 80 10*3/uL — ABNORMAL LOW (ref 150–400)
RBC: 2.82 MIL/uL — ABNORMAL LOW (ref 3.87–5.11)
RDW: 17.7 % — ABNORMAL HIGH (ref 11.5–15.5)
WBC: 4.1 10*3/uL (ref 4.0–10.5)
nRBC: 0 % (ref 0.0–0.2)

## 2020-04-03 LAB — COMPREHENSIVE METABOLIC PANEL
ALT: 41 U/L (ref 0–44)
AST: 41 U/L (ref 15–41)
Albumin: 3.2 g/dL — ABNORMAL LOW (ref 3.5–5.0)
Alkaline Phosphatase: 62 U/L (ref 38–126)
Anion gap: 7 (ref 5–15)
BUN: 37 mg/dL — ABNORMAL HIGH (ref 8–23)
CO2: 25 mmol/L (ref 22–32)
Calcium: 9.2 mg/dL (ref 8.9–10.3)
Chloride: 107 mmol/L (ref 98–111)
Creatinine, Ser: 1.45 mg/dL — ABNORMAL HIGH (ref 0.44–1.00)
GFR calc Af Amer: 41 mL/min — ABNORMAL LOW (ref >60)
GFR calc non Af Amer: 35 mL/min — ABNORMAL LOW (ref >60)
Glucose, Bld: 93 mg/dL (ref 70–99)
Potassium: 4.9 mmol/L (ref 3.5–5.1)
Sodium: 139 mmol/L (ref 135–145)
Total Bilirubin: 0.8 mg/dL (ref 0.3–1.2)
Total Protein: 6.4 g/dL — ABNORMAL LOW (ref 6.5–8.1)

## 2020-04-03 MED ORDER — DEXAMETHASONE 4 MG PO TABS
10.0000 mg | ORAL_TABLET | Freq: Once | ORAL | Status: AC
  Administered 2020-04-03: 10 mg via ORAL
  Filled 2020-04-03: qty 3

## 2020-04-03 MED ORDER — BORTEZOMIB CHEMO SQ INJECTION 3.5 MG (2.5MG/ML)
2.0000 mg | Freq: Once | INTRAMUSCULAR | Status: AC
  Administered 2020-04-03: 2 mg via SUBCUTANEOUS
  Filled 2020-04-03: qty 0.8

## 2020-04-03 NOTE — Progress Notes (Signed)
[facility] CSW Progress Notes  CSW met w patient, her sister SUELLYN MEENAN and caregiver sister Arlayne Liggins in exam room.  Explored caregiver stress and demands of care needed at home.  Patient and sister both require assistance w ADLs including bathing, dressing, toileting.  They both need help w walking and often decline use of assistive devices (these are available in the home but patient declines to use).  Patients live in caregiver sister's home - she is 100% responsible for their care with only occasional help from a niece in [place] or a brother who lives closer by.  One sister requires 3 times/week dialysis.  Caregiver transports to/from these appointments as well as transports to all other appointments.  Per caregiver sister, both patient and sister have mild dementia. Caregiver does all the errands, shopping, laundry, homework.   One is a retired Pharmacist, hospital, the other a retired Marine scientist.  All have [employer] Medicare, monthly income and some assets, Brylyn Novakovich also has a long term Conservation officer, historic buildings.  None are presumed qualified for Medicaid in the outpatient setting, information was given on how to explore this option which does provide significant help with in home care.  There is no current involvement with any outside agency.  All sisters are single with no children.  There are no other family members, neighbors or friends available to provide any kind of significant help with caregiving.  Caregiver sister Deneise Lever is at risk for significant caregiver stress and has her own health challenges.  CSW and family discussed the need to have a plan for getting additional help either in the home or with out of home placement.  Discussed the levels of care available, including skilled nursing care, assisted living/family care home and in home caregivers.  All options require that individuals be qualified to receive the desired level of help and have a payor source for funding.  Family was given  county specific lists of family care and assisted living homes, as well as information explaining the process of seeking admission to these facilities.  Family is asked to look over these options, contact admissions and ask for information on the facility.  Were encouraged to reach out to PCP as well for guidance on placement.  When facility is identified, [facility] CSW/oncologist or PCP can assist w preparation of FL2 to be provided to selected facilities.    As placement process will take time, it was suggested that additional support be provided to family.  Referred family to [facility] for [employer] program.  Caregiver sister will connect with [facility] in [place] to discuss their in home caregiver support program.  Will also refer to [facility] for their environmental modification program - sister mentioned concerns about handicap access in the home as well as concerns with mold, radon and moisture.  Norville Haggard will contact [employer] directly.  Will see family back in two weeks to assess progress.  Edwyna Shell, LCSW Clinical Social Worker Phone:  [phone] Cell:  [phone]

## 2020-04-03 NOTE — Progress Notes (Signed)
04/03/20  Ok to treat with today's labs per Dr Delton Coombes.   Adjust dose with new BSA: 1.54 m2 to 2 mg.   T.O. Dr Rhys Martini, PharmD

## 2020-04-03 NOTE — Progress Notes (Signed)
Patient tolerated Velcade injection with no complaints voiced.  Lab work reviewed.  See MAR for details.  Injection site clean and dry with no bruising or swelling noted.  Band aid applied.  VSS.  Patient left by wheelchair with no s/s of distress noted.  

## 2020-04-10 ENCOUNTER — Other Ambulatory Visit (HOSPITAL_COMMUNITY): Payer: Self-pay | Admitting: Hematology

## 2020-04-10 ENCOUNTER — Telehealth (HOSPITAL_COMMUNITY): Payer: Self-pay | Admitting: General Practice

## 2020-04-10 NOTE — Telephone Encounter (Signed)
[facility] CSW Progress Notes  Email from Johnnette Barrios - patient and sisters can be set up for Senior [employer] delivery, meals can begin this Thursday.  Will provide prepared meals delivered to the home.  Edwyna Shell, LCSW Clinical Social Worker Phone:  [phone]

## 2020-04-12 ENCOUNTER — Other Ambulatory Visit (HOSPITAL_COMMUNITY): Payer: Self-pay | Admitting: Nurse Practitioner

## 2020-04-12 DIAGNOSIS — C9000 Multiple myeloma not having achieved remission: Secondary | ICD-10-CM

## 2020-04-13 ENCOUNTER — Telehealth (HOSPITAL_COMMUNITY): Payer: Self-pay

## 2020-04-13 NOTE — Telephone Encounter (Signed)
Nutrition Assessment   Reason for Assessment:   Patient identified on Malnutrition Screening tool for weight loss and poor appetite   ASSESSMENT:  74 year old female with multiple myeloma. Past medical history of dementia, DM, partial nephrectomy.  Patient receiving velcade and revlimid.    Spoke with sister Deneise Lever and introduce self and service at [facility].  Deneise Lever reports decreased appetite and her having to remind patient to eat.  Drinks boost plus about 1 time per day.  Thought it was causing diarrhea and switched flavor to vanilla and diarrhea is better.  Reports some diarrhea about 1-2 times per week.  Deneise Lever prepares 3 meals per day.  Typically has cereal with fruit for breakfast.  Lunch is boost shake, chicken sandwich today.  Supper is meat (fish, chicken or [place]) and several vegetables.  Noted [facility] are going to be able to deliver meals to patient.     Medications: calcium vit d, MVI, fe sulfate, KCL   Labs: reviewed   Anthropometrics:   Height: 69 inches Weight: 106 lb UBW: 120 lb 12/20 BMI: 15 12% weight loss in the last 4 months, significant  Estimated Energy Needs  Kcals: 1200-1400 Protein: 60-70 g Fluid: > 1.2 L   NUTRITION DIAGNOSIS: Inadequate oral intake related to dementia, chronic illness as evidenced by 12% weight loss in the last 4 months and poor appetite   INTERVENTION:  Discussed strategies to increase calories and protein. Recommend liberalizing diet.  Will mail High Calorie, High Protein Nutrition Therapy handout from [facility] to sister.   Encouraged to continue boost plus and increase to BID if tolerated. Will mail coupons. Contact information provided.     MONITORING, EVALUATION, GOAL: weight trends, intake   Next Visit: May 28 phone f/u  Damarrion Mimbs B. Zenia Resides, [place], [place] Registered Dietitian [phone] (pager)

## 2020-04-17 ENCOUNTER — Inpatient Hospital Stay (HOSPITAL_COMMUNITY): Payer: Medicare PPO | Attending: Hematology

## 2020-04-17 ENCOUNTER — Inpatient Hospital Stay (HOSPITAL_COMMUNITY): Payer: Medicare PPO

## 2020-04-17 ENCOUNTER — Other Ambulatory Visit (HOSPITAL_COMMUNITY): Payer: Self-pay | Admitting: *Deleted

## 2020-04-17 ENCOUNTER — Inpatient Hospital Stay (HOSPITAL_COMMUNITY): Payer: Medicare PPO | Admitting: Hematology

## 2020-04-17 ENCOUNTER — Other Ambulatory Visit: Payer: Self-pay

## 2020-04-17 ENCOUNTER — Inpatient Hospital Stay (HOSPITAL_COMMUNITY): Payer: Medicare PPO | Admitting: General Practice

## 2020-04-17 DIAGNOSIS — R634 Abnormal weight loss: Secondary | ICD-10-CM | POA: Insufficient documentation

## 2020-04-17 DIAGNOSIS — E876 Hypokalemia: Secondary | ICD-10-CM | POA: Insufficient documentation

## 2020-04-17 DIAGNOSIS — Z5111 Encounter for antineoplastic chemotherapy: Secondary | ICD-10-CM | POA: Insufficient documentation

## 2020-04-17 DIAGNOSIS — Z79899 Other long term (current) drug therapy: Secondary | ICD-10-CM | POA: Diagnosis not present

## 2020-04-17 DIAGNOSIS — D696 Thrombocytopenia, unspecified: Secondary | ICD-10-CM | POA: Insufficient documentation

## 2020-04-17 DIAGNOSIS — R7989 Other specified abnormal findings of blood chemistry: Secondary | ICD-10-CM | POA: Insufficient documentation

## 2020-04-17 DIAGNOSIS — C9000 Multiple myeloma not having achieved remission: Secondary | ICD-10-CM

## 2020-04-17 DIAGNOSIS — F0390 Unspecified dementia without behavioral disturbance: Secondary | ICD-10-CM | POA: Diagnosis not present

## 2020-04-17 LAB — COMPREHENSIVE METABOLIC PANEL
ALT: 48 U/L — ABNORMAL HIGH (ref 0–44)
AST: 52 U/L — ABNORMAL HIGH (ref 15–41)
Albumin: 3.1 g/dL — ABNORMAL LOW (ref 3.5–5.0)
Alkaline Phosphatase: 77 U/L (ref 38–126)
Anion gap: 8 (ref 5–15)
BUN: 34 mg/dL — ABNORMAL HIGH (ref 8–23)
CO2: 22 mmol/L (ref 22–32)
Calcium: 9.2 mg/dL (ref 8.9–10.3)
Chloride: 110 mmol/L (ref 98–111)
Creatinine, Ser: 1.36 mg/dL — ABNORMAL HIGH (ref 0.44–1.00)
GFR calc Af Amer: 44 mL/min — ABNORMAL LOW (ref >60)
GFR calc non Af Amer: 38 mL/min — ABNORMAL LOW (ref >60)
Glucose, Bld: 100 mg/dL — ABNORMAL HIGH (ref 70–99)
Potassium: 4.8 mmol/L (ref 3.5–5.1)
Sodium: 140 mmol/L (ref 135–145)
Total Bilirubin: 0.5 mg/dL (ref 0.3–1.2)
Total Protein: 6.1 g/dL — ABNORMAL LOW (ref 6.5–8.1)

## 2020-04-17 LAB — CBC WITH DIFFERENTIAL/PLATELET
Abs Immature Granulocytes: 0.01 10*3/uL (ref 0.00–0.07)
Basophils Absolute: 0 10*3/uL (ref 0.0–0.1)
Basophils Relative: 1 %
Eosinophils Absolute: 0.2 10*3/uL (ref 0.0–0.5)
Eosinophils Relative: 5 %
HCT: 25.4 % — ABNORMAL LOW (ref 36.0–46.0)
Hemoglobin: 8.1 g/dL — ABNORMAL LOW (ref 12.0–15.0)
Immature Granulocytes: 0 %
Lymphocytes Relative: 15 %
Lymphs Abs: 0.5 10*3/uL — ABNORMAL LOW (ref 0.7–4.0)
MCH: 33.8 pg (ref 26.0–34.0)
MCHC: 31.9 g/dL (ref 30.0–36.0)
MCV: 105.8 fL — ABNORMAL HIGH (ref 80.0–100.0)
Monocytes Absolute: 0.4 10*3/uL (ref 0.1–1.0)
Monocytes Relative: 11 %
Neutro Abs: 2.4 10*3/uL (ref 1.7–7.7)
Neutrophils Relative %: 68 %
Platelets: 124 10*3/uL — ABNORMAL LOW (ref 150–400)
RBC: 2.4 MIL/uL — ABNORMAL LOW (ref 3.87–5.11)
RDW: 18.1 % — ABNORMAL HIGH (ref 11.5–15.5)
WBC: 3.5 10*3/uL — ABNORMAL LOW (ref 4.0–10.5)
nRBC: 0 % (ref 0.0–0.2)

## 2020-04-17 LAB — LACTATE DEHYDROGENASE: LDH: 165 U/L (ref 98–192)

## 2020-04-17 MED ORDER — BORTEZOMIB CHEMO SQ INJECTION 3.5 MG (2.5MG/ML)
1.3000 mg/m2 | Freq: Once | INTRAMUSCULAR | Status: AC
  Administered 2020-04-17: 2 mg via SUBCUTANEOUS
  Filled 2020-04-17: qty 0.8

## 2020-04-17 MED ORDER — DEXAMETHASONE 4 MG PO TABS
10.0000 mg | ORAL_TABLET | Freq: Once | ORAL | Status: AC
  Administered 2020-04-17: 11:00:00 10 mg via ORAL
  Filled 2020-04-17: qty 3

## 2020-04-17 NOTE — Progress Notes (Signed)
Amanda Wu presents today for D1C25 Velcade injection. Pt reports no significant changes since last treatment. She has stopped taking her Norvasc as previously instructed by Dr. Delton Coombes. Appetite and energy levels have been good. Lab results and vital signs stable, assessed by Dr. Delton Coombes. Per MD and parameters, ok to proceed with injection today.  Injection tolerated without incident or complaint. Site clean and dry, bandaid applied. See MAR for details. Discharged via wheelchair in satisfactory condition in company of sister.

## 2020-04-17 NOTE — Progress Notes (Signed)
[facility] CSW Progress Notes  Brief visit w patient and caregiver sister as they were leaving hospital.  Per sister, family received first delivery of meals via [employer] program - she knows she needs to contact D Luciana Axe to accept delivery of meals for upcoming week.  Also aware that she will be contacted by Lorel Monaco, [facility], to discuss options for home modifications for increased accessibility.  Will schedule visit for 2 weeks time in order to follow up on placement process.  Sister has information on various facilities and was going to be in contact with them to discuss options for assisted living support.  Edwyna Shell, LCSW Clinical Social Worker Phone:  [phone] Cell:  [phone]

## 2020-04-17 NOTE — Progress Notes (Signed)
04/17/20  OK to proceed with today's labs.  T.O. Dr Beckey Downing LPN/Janett Kamath Ronnald Ramp, PharmD

## 2020-04-17 NOTE — Patient Instructions (Signed)
[place] at [facility] Discharge Instructions  You were seen today by Dr. Delton Coombes. He went over your recent lab results. Try taking one imodium tablet everyday, do not give if she is constipated. He will see you back in 4 weeks for labs and follow up.   Thank you for choosing [place] at [facility] to provide your oncology and hematology care.  To afford each patient quality time with our provider, please arrive at least 15 minutes before your scheduled appointment time.   If you have a lab appointment with the [facility] please come in thru the  Main Entrance and check in at the main information desk  You need to re-schedule your appointment should you arrive 10 or more minutes late.  We strive to give you quality time with our providers, and arriving late affects you and other patients whose appointments are after yours.  Also, if you no show three or more times for appointments you may be dismissed from the clinic at the providers discretion.     Again, thank you for choosing [facility].  Our hope is that these requests will decrease the amount of time that you wait before being seen by our physicians.       _____________________________________________________________  Should you have questions after your visit to [facility], please contact our office at [phone] between the hours of 8:00 a.m. and 4:30 p.m.  Voicemails left after 4:00 p.m. will not be returned until the following business day.  For prescription refill requests, have your pharmacy contact our office and allow 72 hours.    Cancer Center Support Programs:   > Cancer Support Group  2nd Tuesday of the month 1pm-2pm, Journey Room

## 2020-04-17 NOTE — Patient Instructions (Signed)
[facility] Discharge Instructions for Patients Receiving Chemotherapy   Beginning January 23rd 2017 lab work for the Cancer Center will be done in the  Main lab at [place] on 1st floor. If you have a lab appointment with the Cancer Center please come in thru the  Main Entrance and check in at the main information desk   Today you received the following chemotherapy agents Velcade  To help prevent nausea and vomiting after your treatment, we encourage you to take your nausea medication    If you develop nausea and vomiting, or diarrhea that is not controlled by your medication, call the clinic.  The clinic phone number is [phone]. Office hours are Monday-Friday 8:30am-5:00pm.  BELOW ARE SYMPTOMS THAT SHOULD BE REPORTED IMMEDIATELY:  *FEVER GREATER THAN 101.0 F  *CHILLS WITH OR WITHOUT FEVER  NAUSEA AND VOMITING THAT IS NOT CONTROLLED WITH YOUR NAUSEA MEDICATION  *UNUSUAL SHORTNESS OF BREATH  *UNUSUAL BRUISING OR BLEEDING  TENDERNESS IN MOUTH AND THROAT WITH OR WITHOUT PRESENCE OF ULCERS  *URINARY PROBLEMS  *BOWEL PROBLEMS  UNUSUAL RASH Items with * indicate a potential emergency and should be followed up as soon as possible. If you have an emergency after office hours please contact your primary care physician or go to the nearest emergency department.  Please call the clinic during office hours if you have any questions or concerns.   You may also contact the Patient Navigator at [phone] should you have any questions or need assistance in obtaining follow up care.      Resources For Cancer Patients and their Caregivers ? American Cancer Society: Can assist with transportation, wigs, general needs, runs Look Good Feel Better.        1-888-227-6333 ? Cancer Care: Provides financial assistance, online support groups, medication/co-pay assistance.  1-800-813-HOPE (4673) ? [facility] Assists [place] cancer  patients and their families through emotional , educational and financial support.  336-427-4357 ? [place] DSS Where to apply for food stamps, Medicaid and utility assistance. 336-342-1394 ? [facility]: Transportation to medical appointments. 336-347-2287 ? Social Security Administration: May apply for disability if have a Stage IV cancer. 336-342-7796 1-800-772-1213 ? [place] Aging, Disability and Transit Services: Assists with nutrition, care and transit needs. [phone]          

## 2020-04-17 NOTE — Progress Notes (Signed)
[address] [place], [place] [address]   CLINIC:  Medical Oncology/Hematology  PCP:  Monico Blitz, MD [place] [address] [phone]   REASON FOR VISIT:  Follow-up for Multiple Myeloma   CURRENT THERAPY: Rev/Velcade/Dex  BRIEF ONCOLOGIC HISTORY:  Oncology History  Multiple myeloma ([facility])  07/09/2017 Initial Diagnosis   Multiple myeloma ([place])   06/01/2018 -  Chemotherapy   The patient had bortezomib SQ (VELCADE) chemo injection 1.75 mg, 1 mg/m2 = 1.75 mg (100 % of original dose 1 mg/m2), Subcutaneous,  Once, 25 of 27 cycles Dose modification: 1 mg/m2 (original dose 1 mg/m2, Cycle 1, Reason: Provider Judgment), 1.3 mg/m2 (original dose 1 mg/m2, Cycle 8, Reason: Provider Judgment) Administration: 1.75 mg (06/01/2018), 1.75 mg (06/08/2018), 1.75 mg (06/15/2018), 1.75 mg (06/29/2018), 1.75 mg (07/06/2018), 1.75 mg (07/13/2018), 1.75 mg (07/27/2018), 1.75 mg (08/03/2018), 1.75 mg (08/10/2018), 1.75 mg (08/24/2018), 1.75 mg (08/31/2018), 1.75 mg (09/07/2018), 1.75 mg (09/21/2018), 1.75 mg (09/28/2018), 1.75 mg (10/05/2018), 1.75 mg (10/19/2018), 1.75 mg (10/26/2018), 1.75 mg (11/02/2018), 1.75 mg (11/16/2018), 1.75 mg (11/23/2018), 1.75 mg (11/30/2018), 2.25 mg (12/17/2018), 2.25 mg (12/24/2018), 2.25 mg (01/04/2019), 2.25 mg (01/18/2019), 2.25 mg (01/25/2019), 2.25 mg (02/01/2019), 2.25 mg (02/15/2019), 2.25 mg (02/22/2019), 2.25 mg (03/01/2019), 2.25 mg (03/15/2019), 2.25 mg (03/23/2019), 2.25 mg (03/31/2019), 2.25 mg (04/12/2019), 2.25 mg (04/19/2019), 2.25 mg (04/26/2019), 2.25 mg (05/10/2019), 2.25 mg (05/17/2019), 2.25 mg (05/24/2019), 2.25 mg (06/07/2019), 2.25 mg (06/14/2019), 2.25 mg (06/21/2019), 2.25 mg (07/05/2019), 2.25 mg (07/12/2019), 2.25 mg (07/19/2019), 2.25 mg (08/02/2019), 2.25 mg (08/09/2019), 2.25 mg (08/16/2019), 2.25 mg (08/30/2019), 2.25 mg (09/06/2019), 2.25 mg (09/13/2019), 2.25 mg (09/27/2019), 2.25 mg (10/04/2019), 2.25 mg (10/11/2019), 2.25 mg (10/25/2019), 2.25 mg (11/08/2019), 2.25  mg (11/22/2019), 2.25 mg (11/29/2019), 2.25 mg (12/06/2019), 2.25 mg (12/20/2019), 2.25 mg (12/27/2019), 2.25 mg (01/03/2020), 2.25 mg (01/17/2020), 2.25 mg (01/24/2020), 2.25 mg (01/31/2020), 2.25 mg (02/21/2020), 2.25 mg (02/28/2020), 2.25 mg (03/06/2020), 2.25 mg (03/20/2020), 2.25 mg (03/27/2020), 2 mg (04/03/2020), 2 mg (04/17/2020)  for chemotherapy treatment.        INTERVAL HISTORY:  Amanda Wu 74 y.o. female  Seen for follow-up and toxicity assessment prior to her treatment for myeloma. Has lost about 3 pounds in the last 4 weeks. Restarted drinking 1 can of boost per day. Reports decrease in appetite. Denies any nausea or vomiting. She reports occasional diarrhea.  REVIEW OF SYSTEMS:  Review of Systems  Gastrointestinal: Positive for diarrhea.  All other systems reviewed and are negative.    PAST MEDICAL/SURGICAL HISTORY:  Past Medical History:  Diagnosis Date  . Benign hypertension   . Cataract   . 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 Diabetes mellitus without complication ([place])   . H/O partial nephrectomy   . Hypokalemia   . Hypothyroidism   . Impaired cognition   . Multiple myeloma (HCC)    Dr Maylon Peppers, [facility]  . Thyroid disease    Past Surgical History:  Procedure Laterality Date  . ABDOMINAL HYSTERECTOMY    . CRANIOTOMY     for lymphoma  . YAG LASER APPLICATION Right 9/89/2119   Procedure: YAG LASER APPLICATION;  Surgeon: [name], MD;  Location: [facility];  Service: Ophthalmology;  Laterality: Right;     SOCIAL HISTORY:  Social History   Socioeconomic History  . Marital status: Single    Spouse name: Not on file  . Number of children: Not on file  . Years of education: Not on file  . Highest  education level: Not on file  Occupational History  . Not on file  Tobacco Use  . Smoking status: Never Smoker  . Smokeless tobacco: Never Used  Substance and Sexual Activity  . Alcohol use: No  . Drug use: No  . Sexual activity: Not on file  Other Topics Concern  .  Not on file  Social History Narrative  . Not on file   Social Determinants of Health   Financial Resource Strain:   . Difficulty of Paying Living Expenses:   Food Insecurity:   . Worried About Running Out of Food in the Last Year:   . Ran Out of Food in the Last Year:   Transportation Needs:   . Lack of Transportation (Medical):   . Lack of Transportation (Non-Medical):   Physical Activity:   . Days of Exercise per Week:   . Minutes of Exercise per Session:   Stress:   . Feeling of Stress :   Social Connections:   . Frequency of Communication with Friends and Family:   . Frequency of Social Gatherings with Friends and Family:   . Attends Religious Services:   . Active Member of Clubs or Organizations:   . Attends Club or Organization Meetings:   . Marital Status:   Intimate Partner Violence:   . Fear of Current or Ex-Partner:   . Emotionally Abused:   . Physically Abused:   . Sexually Abused:     FAMILY HISTORY:  Family History  Problem Relation Age of Onset  . Depression Mother   . Diabetes Mother   . Heart disease Father   . Cancer - Prostate Brother   . Cancer Brother   . Cancer Sister     CURRENT MEDICATIONS:  Outpatient Encounter Medications as of 04/17/2020  Medication Sig Note  . ACCU-CHEK SMARTVIEW test strip    . Accu-Chek Softclix Lancets lancets    . acetaminophen (TYLENOL) 500 MG tablet Take 1,000 mg by mouth every 6 (six) hours as needed for mild pain.    . acyclovir (ZOVIRAX) 400 MG tablet TAKE 1 TABLET BY MOUTH TWICE DAILY   . aspirin EC 81 MG tablet Take 81 mg by mouth daily.   . bortezomib IV (VELCADE) 3.5 MG injection Inject into the vein.   . calcium-vitamin D (OSCAL WITH D) 500-200 MG-UNIT tablet Take 1 tablet by mouth daily with breakfast.   . dexamethasone (DECADRON) 4 MG tablet TAKE TWO AND ONE-HALF TABLETS BY MOUTH WEEKLY FOR THREE WEEKS (THREE WEEKS ON, ONE WEEK OFF) IN A ROW AND REPEAT MONTHLY 06/22/2018: Patient states that she is given the  dexamethasone while here getting her velcade injection  . ferrous sulfate 325 (65 FE) MG tablet Take 325 mg by mouth daily with breakfast.   . levothyroxine (SYNTHROID, LEVOTHROID) 25 MCG tablet Take 25 mcg by mouth daily before breakfast.   . losartan (COZAAR) 100 MG tablet Take 50 mg by mouth daily.    . Multiple Vitamins-Minerals (MULTIVITAMIN WOMEN 50+ PO) Take by mouth.   . [DISCONTINUED] donepezil (ARICEPT) 10 MG tablet TAKE 1 TABLET BY MOUTH every evening   . [DISCONTINUED] lenalidomide (REVLIMID) 10 MG capsule TAKE 1 CAPSULE BY MOUTH  DAILY FOR 14 DAYS ON THEN  14 DAYS OFF   . [DISCONTINUED] potassium chloride (KLOR-CON) 10 MEQ tablet TAKE THREE TABLETS BY MOUTH EVERY DAY (Patient taking differently: Takes two tablets daily)   . amLODipine (NORVASC) 5 MG tablet     Facility-Administered Encounter Medications as of 04/17/2020    Medication  . prochlorperazine (COMPAZINE) tablet 10 mg    ALLERGIES:  Allergies  Allergen Reactions  . Lotensin [Benazepril Hcl]     Facial swelling (Angioedema) Note: ARBs should also be contraindicated, but she is on Losartan!     PHYSICAL EXAM:  ECOG Performance status: 1  Vitals:   04/17/20 1009  BP: (!) 95/47  Pulse: 62  Resp: 16  Temp: (!) 97.1 F (36.2 C)  SpO2: 96%   Filed Weights   04/17/20 1009  Weight: 109 lb 12.8 oz (49.8 kg)    Physical Exam Vitals reviewed.  Constitutional:      Appearance: Normal appearance.  HENT:     Head: Normocephalic.     Nose: Nose normal.     Mouth/Throat:     Mouth: Mucous membranes are moist.     Pharynx: Oropharynx is clear.  Eyes:     Extraocular Movements: Extraocular movements intact.     Conjunctiva/sclera: Conjunctivae normal.  Cardiovascular:     Rate and Rhythm: Normal rate and regular rhythm.     Pulses: Normal pulses.     Heart sounds: Normal heart sounds.  Pulmonary:     Effort: Pulmonary effort is normal.     Breath sounds: Normal breath sounds.  Abdominal:     General:  Bowel sounds are normal.     Palpations: Abdomen is soft.  Musculoskeletal:        General: Normal range of motion.     Cervical back: Normal range of motion.  Skin:    General: Skin is warm and dry.  Neurological:     Mental Status: She is alert. Mental status is at baseline.  Psychiatric:        Mood and Affect: Mood normal.        Behavior: Behavior normal.      LABORATORY DATA:  I have reviewed the labs as listed.  CBC    Component Value Date/Time   WBC 3.0 (L) 04/24/2020 0922   RBC 2.65 (L) 04/24/2020 0922   HGB 8.8 (L) 04/24/2020 0922   HCT 28.1 (L) 04/24/2020 0922   PLT 91 (L) 04/24/2020 0922   MCV 106.0 (H) 04/24/2020 0922   MCH 33.2 04/24/2020 0922   MCHC 31.3 04/24/2020 0922   RDW 18.4 (H) 04/24/2020 0922   LYMPHSABS 0.5 (L) 04/24/2020 0922   MONOABS 0.3 04/24/2020 0922   EOSABS 0.1 04/24/2020 0922   BASOSABS 0.0 04/24/2020 0922   CMP Latest Ref Rng & Units 04/24/2020 04/17/2020 04/03/2020  Glucose 70 - 99 mg/dL 94 100(H) 93  BUN 8 - 23 mg/dL 36(H) 34(H) 37(H)  Creatinine 0.44 - 1.00 mg/dL 1.21(H) 1.36(H) 1.45(H)  Sodium 135 - 145 mmol/L 142 140 139  Potassium 3.5 - 5.1 mmol/L 3.9 4.8 4.9  Chloride 98 - 111 mmol/L 112(H) 110 107  CO2 22 - 32 mmol/L 23 22 25  Calcium 8.9 - 10.3 mg/dL 8.9 9.2 9.2  Total Protein 6.5 - 8.1 g/dL 6.1(L) 6.1(L) 6.4(L)  Total Bilirubin 0.3 - 1.2 mg/dL 0.4 0.5 0.8  Alkaline Phos 38 - 126 U/L 72 77 62  AST 15 - 41 U/L 56(H) 52(H) 41  ALT 0 - 44 U/L 53(H) 48(H) 41     I have reviewed scans.     ASSESSMENT & PLAN:   Multiple myeloma (HCC) 1. IgG lambda multiple myeloma: -Revlimid 10 mg 2 weeks on/2 weeks off, Velcade 3 weeks on/1 week off, dexamethasone 10 mg on days of Velcade. -We reviewed myeloma labs   from 03/20/2020. M spike has slightly increased to 0.6. Kappa light chains are 43.9, lambda 39.5 with ratio of 1.11. -White count is adequate to proceed with treatment today. We will reevaluate her in 4 weeks. We will follow up  on myeloma panel from today.  2. Weight loss: -She lost a total of 8 pounds in the last couple of months. She reportedly stopped drinking Ensure at bedtime as it causes her to have diarrhea. -She restarted back boost 1/day in the last 4 weeks. She reported decreased appetite. We will keep a close eye on it.  3. Elevated creatinine: -Today it has improved to 1.36. We have cut back on losartan to 50 mg daily.  3. Hypokalemia: -Potassium today is 4.8. Continue potassium supplements 2 pills/day.  4. Dementia: -This is from whole brain RT for CNS lymphoma. Continue Aricept 10 mg daily. We will try to get her in nursing home.  5. Shingles prophylaxis: -Continue acyclovir 400 mg twice daily.  6. Thrombocytopenia: -Platelet count improved to 124. This is from myelosuppression from therapy.  7. Bone strengthening: -She was treated with Zometa in the past for several years and was discontinued.     Orders placed this encounter:  No orders of the defined types were placed in this encounter.      , MD [facility] [phone]   

## 2020-04-18 ENCOUNTER — Other Ambulatory Visit (HOSPITAL_COMMUNITY): Payer: Self-pay | Admitting: Nurse Practitioner

## 2020-04-18 ENCOUNTER — Other Ambulatory Visit (HOSPITAL_COMMUNITY): Payer: Self-pay | Admitting: *Deleted

## 2020-04-18 DIAGNOSIS — C9000 Multiple myeloma not having achieved remission: Secondary | ICD-10-CM

## 2020-04-18 LAB — KAPPA/LAMBDA LIGHT CHAINS
Kappa free light chain: 44.2 mg/L — ABNORMAL HIGH (ref 3.3–19.4)
Kappa, lambda light chain ratio: 1.3 (ref 0.26–1.65)
Lambda free light chains: 34.1 mg/L — ABNORMAL HIGH (ref 5.7–26.3)

## 2020-04-18 MED ORDER — LENALIDOMIDE 10 MG PO CAPS: ORAL_CAPSULE | ORAL | 0 refills | Status: DC

## 2020-04-18 MED ORDER — CYANOCOBALAMIN 1000 MCG/ML IJ SOLN
INTRAMUSCULAR | Status: AC
  Filled 2020-04-18: qty 1

## 2020-04-18 NOTE — Telephone Encounter (Signed)
Okay per Francene Finders, NP to refill Revlimid

## 2020-04-19 LAB — PROTEIN ELECTROPHORESIS, SERUM
A/G Ratio: 1 (ref 0.7–1.7)
Albumin ELP: 2.8 g/dL — ABNORMAL LOW (ref 2.9–4.4)
Alpha-1-Globulin: 0.2 g/dL (ref 0.0–0.4)
Alpha-2-Globulin: 0.7 g/dL (ref 0.4–1.0)
Beta Globulin: 0.7 g/dL (ref 0.7–1.3)
Gamma Globulin: 1.3 g/dL (ref 0.4–1.8)
Globulin, Total: 2.8 g/dL (ref 2.2–3.9)
M-Spike, %: 0.5 g/dL — ABNORMAL HIGH
Total Protein ELP: 5.6 g/dL — ABNORMAL LOW (ref 6.0–8.5)

## 2020-04-24 ENCOUNTER — Encounter (HOSPITAL_COMMUNITY): Payer: Self-pay

## 2020-04-24 ENCOUNTER — Other Ambulatory Visit: Payer: Self-pay

## 2020-04-24 ENCOUNTER — Inpatient Hospital Stay (HOSPITAL_COMMUNITY): Payer: Medicare PPO

## 2020-04-24 VITALS — BP 96/50 | HR 64 | Temp 96.9°F | Resp 16

## 2020-04-24 DIAGNOSIS — Z79899 Other long term (current) drug therapy: Secondary | ICD-10-CM | POA: Diagnosis not present

## 2020-04-24 DIAGNOSIS — F0390 Unspecified dementia without behavioral disturbance: Secondary | ICD-10-CM | POA: Diagnosis not present

## 2020-04-24 DIAGNOSIS — C9000 Multiple myeloma not having achieved remission: Secondary | ICD-10-CM

## 2020-04-24 DIAGNOSIS — D696 Thrombocytopenia, unspecified: Secondary | ICD-10-CM | POA: Diagnosis not present

## 2020-04-24 DIAGNOSIS — E876 Hypokalemia: Secondary | ICD-10-CM | POA: Diagnosis not present

## 2020-04-24 DIAGNOSIS — R7989 Other specified abnormal findings of blood chemistry: Secondary | ICD-10-CM | POA: Diagnosis not present

## 2020-04-24 DIAGNOSIS — R634 Abnormal weight loss: Secondary | ICD-10-CM | POA: Diagnosis not present

## 2020-04-24 DIAGNOSIS — Z5111 Encounter for antineoplastic chemotherapy: Secondary | ICD-10-CM | POA: Diagnosis not present

## 2020-04-24 LAB — COMPREHENSIVE METABOLIC PANEL
ALT: 53 U/L — ABNORMAL HIGH (ref 0–44)
AST: 56 U/L — ABNORMAL HIGH (ref 15–41)
Albumin: 3.1 g/dL — ABNORMAL LOW (ref 3.5–5.0)
Alkaline Phosphatase: 72 U/L (ref 38–126)
Anion gap: 7 (ref 5–15)
BUN: 36 mg/dL — ABNORMAL HIGH (ref 8–23)
CO2: 23 mmol/L (ref 22–32)
Calcium: 8.9 mg/dL (ref 8.9–10.3)
Chloride: 112 mmol/L — ABNORMAL HIGH (ref 98–111)
Creatinine, Ser: 1.21 mg/dL — ABNORMAL HIGH (ref 0.44–1.00)
GFR calc Af Amer: 51 mL/min — ABNORMAL LOW (ref >60)
GFR calc non Af Amer: 44 mL/min — ABNORMAL LOW (ref >60)
Glucose, Bld: 94 mg/dL (ref 70–99)
Potassium: 3.9 mmol/L (ref 3.5–5.1)
Sodium: 142 mmol/L (ref 135–145)
Total Bilirubin: 0.4 mg/dL (ref 0.3–1.2)
Total Protein: 6.1 g/dL — ABNORMAL LOW (ref 6.5–8.1)

## 2020-04-24 LAB — CBC WITH DIFFERENTIAL/PLATELET
Abs Immature Granulocytes: 0.01 K/uL (ref 0.00–0.07)
Basophils Absolute: 0 K/uL (ref 0.0–0.1)
Basophils Relative: 1 %
Eosinophils Absolute: 0.1 K/uL (ref 0.0–0.5)
Eosinophils Relative: 3 %
HCT: 28.1 % — ABNORMAL LOW (ref 36.0–46.0)
Hemoglobin: 8.8 g/dL — ABNORMAL LOW (ref 12.0–15.0)
Immature Granulocytes: 0 %
Lymphocytes Relative: 17 %
Lymphs Abs: 0.5 K/uL — ABNORMAL LOW (ref 0.7–4.0)
MCH: 33.2 pg (ref 26.0–34.0)
MCHC: 31.3 g/dL (ref 30.0–36.0)
MCV: 106 fL — ABNORMAL HIGH (ref 80.0–100.0)
Monocytes Absolute: 0.3 K/uL (ref 0.1–1.0)
Monocytes Relative: 11 %
Neutro Abs: 2 K/uL (ref 1.7–7.7)
Neutrophils Relative %: 68 %
Platelets: 91 K/uL — ABNORMAL LOW (ref 150–400)
RBC: 2.65 MIL/uL — ABNORMAL LOW (ref 3.87–5.11)
RDW: 18.4 % — ABNORMAL HIGH (ref 11.5–15.5)
WBC: 3 K/uL — ABNORMAL LOW (ref 4.0–10.5)
nRBC: 0 % (ref 0.0–0.2)

## 2020-04-24 LAB — MAGNESIUM: Magnesium: 2.2 mg/dL (ref 1.7–2.4)

## 2020-04-24 MED ORDER — BORTEZOMIB CHEMO SQ INJECTION 3.5 MG (2.5MG/ML)
1.3000 mg/m2 | Freq: Once | INTRAMUSCULAR | Status: AC
  Administered 2020-04-24: 2 mg via SUBCUTANEOUS
  Filled 2020-04-24: qty 0.8

## 2020-04-24 MED ORDER — DEXAMETHASONE 4 MG PO TABS
10.0000 mg | ORAL_TABLET | Freq: Once | ORAL | Status: AC
  Administered 2020-04-24: 10 mg via ORAL
  Filled 2020-04-24: qty 3

## 2020-04-24 NOTE — Progress Notes (Signed)
[place] reviewed with RLockamy NP and pt approved for Velcade injection today per NP                                                        Lynann M Antonini tolerated Velcade injection well without complaints or incident. VSS Pt discharged via wheelchair in satisfactory condition

## 2020-04-24 NOTE — Patient Instructions (Signed)
[facility] Discharge Instructions for Patients Receiving Chemotherapy   Beginning January 23rd 2017 lab work for the Cancer Center will be done in the  Main lab at  on 1st floor. If you have a lab appointment with the Cancer Center please come in thru the  Main Entrance and check in at the main information desk   Today you received the following chemotherapy agents Velcade injection. Follow-up as scheduled. Call clinic for any questions or concerns  To help prevent nausea and vomiting after your treatment, we encourage you to take your nausea medication   If you develop nausea and vomiting, or diarrhea that is not controlled by your medication, call the clinic.  The clinic phone number is [phone]. Office hours are Monday-Friday 8:30am-5:00pm.  BELOW ARE SYMPTOMS THAT SHOULD BE REPORTED IMMEDIATELY:  *FEVER GREATER THAN 101.0 F  *CHILLS WITH OR WITHOUT FEVER  NAUSEA AND VOMITING THAT IS NOT CONTROLLED WITH YOUR NAUSEA MEDICATION  *UNUSUAL SHORTNESS OF BREATH  *UNUSUAL BRUISING OR BLEEDING  TENDERNESS IN MOUTH AND THROAT WITH OR WITHOUT PRESENCE OF ULCERS  *URINARY PROBLEMS  *BOWEL PROBLEMS  UNUSUAL RASH Items with * indicate a potential emergency and should be followed up as soon as possible. If you have an emergency after office hours please contact your primary care physician or go to the nearest emergency department.  Please call the clinic during office hours if you have any questions or concerns.   You may also contact the Patient Navigator at [phone] should you have any questions or need assistance in obtaining follow up care.      Resources For Cancer Patients and their Caregivers ? American Cancer Society: Can assist with transportation, wigs, general needs, runs Look Good Feel Better.        1-888-227-6333 ? Cancer Care: Provides financial assistance, online support groups, medication/co-pay assistance.   1-800-813-HOPE (4673) ? [facility] Assists [place] cancer patients and their families through emotional , educational and financial support.  336-427-4357 ? [place] DSS Where to apply for food stamps, Medicaid and utility assistance. 336-342-1394 ? RCATS: Transportation to medical appointments. 336-347-2287 ? Social Security Administration: May apply for disability if have a Stage IV cancer. 336-342-7796 1-800-772-1213 ? [place] Aging, Disability and Transit Services: Assists with nutrition, care and transit needs. [phone]         

## 2020-04-28 NOTE — Assessment & Plan Note (Signed)
1. IgG lambda multiple myeloma: -Revlimid 10 mg 2 weeks on/2 weeks off, Velcade 3 weeks on/1 week off, dexamethasone 10 mg on days of Velcade. -We reviewed myeloma labs from 03/20/2020. M spike has slightly increased to 0.6. Kappa light chains are 43.9, lambda 39.5 with ratio of 1.11. -White count is adequate to proceed with treatment today. We will reevaluate her in 4 weeks. We will follow up on myeloma panel from today.  2. Weight loss: -She lost a total of 8 pounds in the last couple of months. She reportedly stopped drinking Ensure at bedtime as it causes her to have diarrhea. -She restarted back boost 1/day in the last 4 weeks. She reported decreased appetite. We will keep a close eye on it.  3. Elevated creatinine: -Today it has improved to 1.36. We have cut back on losartan to 50 mg daily.  3. Hypokalemia: -Potassium today is 4.8. Continue potassium supplements 2 pills/day.  4. Dementia: -This is from whole brain RT for CNS lymphoma. Continue Aricept 10 mg daily. We will try to get her in nursing home.  5. Shingles prophylaxis: -Continue acyclovir 400 mg twice daily.  6. Thrombocytopenia: -Platelet count improved to 124. This is from myelosuppression from therapy.  7. Bone strengthening: -She was treated with Zometa in the past for several years and was discontinued.

## 2020-05-01 ENCOUNTER — Encounter (HOSPITAL_COMMUNITY): Payer: Self-pay

## 2020-05-01 ENCOUNTER — Inpatient Hospital Stay (HOSPITAL_COMMUNITY): Payer: Medicare PPO

## 2020-05-01 ENCOUNTER — Inpatient Hospital Stay (HOSPITAL_COMMUNITY): Payer: Medicare PPO | Admitting: General Practice

## 2020-05-01 ENCOUNTER — Other Ambulatory Visit: Payer: Self-pay

## 2020-05-01 VITALS — BP 95/62 | HR 60 | Temp 97.3°F | Resp 16

## 2020-05-01 DIAGNOSIS — Z5111 Encounter for antineoplastic chemotherapy: Secondary | ICD-10-CM | POA: Diagnosis not present

## 2020-05-01 DIAGNOSIS — C9000 Multiple myeloma not having achieved remission: Secondary | ICD-10-CM

## 2020-05-01 DIAGNOSIS — E876 Hypokalemia: Secondary | ICD-10-CM | POA: Diagnosis not present

## 2020-05-01 DIAGNOSIS — R634 Abnormal weight loss: Secondary | ICD-10-CM | POA: Diagnosis not present

## 2020-05-01 DIAGNOSIS — D696 Thrombocytopenia, unspecified: Secondary | ICD-10-CM | POA: Diagnosis not present

## 2020-05-01 DIAGNOSIS — Z79899 Other long term (current) drug therapy: Secondary | ICD-10-CM | POA: Diagnosis not present

## 2020-05-01 DIAGNOSIS — F0390 Unspecified dementia without behavioral disturbance: Secondary | ICD-10-CM | POA: Diagnosis not present

## 2020-05-01 DIAGNOSIS — R7989 Other specified abnormal findings of blood chemistry: Secondary | ICD-10-CM | POA: Diagnosis not present

## 2020-05-01 LAB — MAGNESIUM: Magnesium: 2.2 mg/dL (ref 1.7–2.4)

## 2020-05-01 LAB — CBC WITH DIFFERENTIAL/PLATELET
Abs Immature Granulocytes: 0.01 10*3/uL (ref 0.00–0.07)
Basophils Absolute: 0 10*3/uL (ref 0.0–0.1)
Basophils Relative: 1 %
Eosinophils Absolute: 0.2 10*3/uL (ref 0.0–0.5)
Eosinophils Relative: 5 %
HCT: 28.1 % — ABNORMAL LOW (ref 36.0–46.0)
Hemoglobin: 9 g/dL — ABNORMAL LOW (ref 12.0–15.0)
Immature Granulocytes: 0 %
Lymphocytes Relative: 14 %
Lymphs Abs: 0.5 10*3/uL — ABNORMAL LOW (ref 0.7–4.0)
MCH: 33.7 pg (ref 26.0–34.0)
MCHC: 32 g/dL (ref 30.0–36.0)
MCV: 105.2 fL — ABNORMAL HIGH (ref 80.0–100.0)
Monocytes Absolute: 0.2 10*3/uL (ref 0.1–1.0)
Monocytes Relative: 7 %
Neutro Abs: 2.6 10*3/uL (ref 1.7–7.7)
Neutrophils Relative %: 73 %
Platelets: 81 10*3/uL — ABNORMAL LOW (ref 150–400)
RBC: 2.67 MIL/uL — ABNORMAL LOW (ref 3.87–5.11)
RDW: 18 % — ABNORMAL HIGH (ref 11.5–15.5)
WBC: 3.5 10*3/uL — ABNORMAL LOW (ref 4.0–10.5)
nRBC: 0 % (ref 0.0–0.2)

## 2020-05-01 LAB — COMPREHENSIVE METABOLIC PANEL
ALT: 57 U/L — ABNORMAL HIGH (ref 0–44)
AST: 55 U/L — ABNORMAL HIGH (ref 15–41)
Albumin: 3.1 g/dL — ABNORMAL LOW (ref 3.5–5.0)
Alkaline Phosphatase: 82 U/L (ref 38–126)
Anion gap: 11 (ref 5–15)
BUN: 22 mg/dL (ref 8–23)
CO2: 21 mmol/L — ABNORMAL LOW (ref 22–32)
Calcium: 8.7 mg/dL — ABNORMAL LOW (ref 8.9–10.3)
Chloride: 108 mmol/L (ref 98–111)
Creatinine, Ser: 1.14 mg/dL — ABNORMAL HIGH (ref 0.44–1.00)
GFR calc Af Amer: 54 mL/min — ABNORMAL LOW (ref >60)
GFR calc non Af Amer: 47 mL/min — ABNORMAL LOW (ref >60)
Glucose, Bld: 110 mg/dL — ABNORMAL HIGH (ref 70–99)
Potassium: 4.3 mmol/L (ref 3.5–5.1)
Sodium: 140 mmol/L (ref 135–145)
Total Bilirubin: 0.6 mg/dL (ref 0.3–1.2)
Total Protein: 5.9 g/dL — ABNORMAL LOW (ref 6.5–8.1)

## 2020-05-01 MED ORDER — BORTEZOMIB CHEMO SQ INJECTION 3.5 MG (2.5MG/ML)
1.3000 mg/m2 | Freq: Once | INTRAMUSCULAR | Status: AC
  Administered 2020-05-01: 2 mg via SUBCUTANEOUS
  Filled 2020-05-01: qty 0.8

## 2020-05-01 MED ORDER — DEXAMETHASONE 4 MG PO TABS
10.0000 mg | ORAL_TABLET | Freq: Once | ORAL | Status: AC
  Administered 2020-05-01: 10 mg via ORAL
  Filled 2020-05-01: qty 3

## 2020-05-01 NOTE — Progress Notes (Signed)
1134 Labs reviewed with RLockamy NP today and pt approved for Velcade injection today per NP                                                   Amanda Wu tolerated Velcade injection well without complaints or incident. VSS Pt discharged via wheelchair in satisfactory condition accompanied by family member

## 2020-05-01 NOTE — Patient Instructions (Signed)
[facility] Discharge Instructions for Patients Receiving Chemotherapy   Beginning January 23rd 2017 lab work for the Cancer Center will be done in the  Main lab at [facility] on 1st floor. If you have a lab appointment with the Cancer Center please come in thru the  Main Entrance and check in at the main information desk   Today you received the following chemotherapy agents Velcade injection. Follow-up as scheduled. Call clinic for any questions or concerns  To help prevent nausea and vomiting after your treatment, we encourage you to take your nausea medication   If you develop nausea and vomiting, or diarrhea that is not controlled by your medication, call the clinic.  The clinic phone number is [phone]. Office hours are Monday-Friday 8:30am-5:00pm.  BELOW ARE SYMPTOMS THAT SHOULD BE REPORTED IMMEDIATELY:  *FEVER GREATER THAN 101.0 F  *CHILLS WITH OR WITHOUT FEVER  NAUSEA AND VOMITING THAT IS NOT CONTROLLED WITH YOUR NAUSEA MEDICATION  *UNUSUAL SHORTNESS OF BREATH  *UNUSUAL BRUISING OR BLEEDING  TENDERNESS IN MOUTH AND THROAT WITH OR WITHOUT PRESENCE OF ULCERS  *URINARY PROBLEMS  *BOWEL PROBLEMS  UNUSUAL RASH Items with * indicate a potential emergency and should be followed up as soon as possible. If you have an emergency after office hours please contact your primary care physician or go to the nearest emergency department.  Please call the clinic during office hours if you have any questions or concerns.   You may also contact the Patient Navigator at [phone] should you have any questions or need assistance in obtaining follow up care.      Resources For Cancer Patients and their Caregivers ? American Cancer Society: Can assist with transportation, wigs, general needs, runs Look Good Feel Better.        1-888-227-6333 ? Cancer Care: Provides financial assistance, online support groups, medication/co-pay assistance.   1-800-813-HOPE (4673) ? [facility] Assists [place] cancer patients and their families through emotional , educational and financial support.  336-427-4357 ? [place] DSS Where to apply for food stamps, Medicaid and utility assistance. 336-342-1394 ? RCATS: Transportation to medical appointments. 336-347-2287 ? Social Security Administration: May apply for disability if have a Stage IV cancer. 336-342-7796 1-800-772-1213 ? [place] Aging, Disability and Transit Services: Assists with nutrition, care and transit needs. [phone]         

## 2020-05-01 NOTE — Progress Notes (Signed)
[facility] CSW Progress Notes  Visit w caregiver sister and patient/patient's sister in waiting room.  Reviewed needs.   They are receiving meals through the [employer] program of [place].  They appreciate this help.  Caregiver sister has not looked at options for assisted living placement at this time - CSW offered to assist w contacting facilities but caregiver sister states "I am not ready for that yet."  Does want more help in the home - specifically help w showers and perhaps limited amount of respite care.  She would also like options for nail care (podiatrist or someone who can come into the home and cut nails).  CSW contacted nurse navigator to determine if shower aide was possible through home health, also reached out to several community resources re respite care.  Awaiting return calls, will update family as options arise.  Edwyna Shell, LCSW Clinical Social Worker Phone:  [phone] Cell:  [phone]

## 2020-05-02 ENCOUNTER — Other Ambulatory Visit (HOSPITAL_COMMUNITY): Payer: Self-pay | Admitting: *Deleted

## 2020-05-02 DIAGNOSIS — C9000 Multiple myeloma not having achieved remission: Secondary | ICD-10-CM

## 2020-05-02 MED ORDER — PEGFILGRASTIM-CBQV 6 MG/0.6ML ~~LOC~~ SOSY
PREFILLED_SYRINGE | SUBCUTANEOUS | Status: AC
  Filled 2020-05-02: qty 0.6

## 2020-05-02 MED ORDER — OCTREOTIDE ACETATE 30 MG IM KIT
PACK | INTRAMUSCULAR | Status: AC
  Filled 2020-05-02: qty 1

## 2020-05-02 MED ORDER — CYANOCOBALAMIN 1000 MCG/ML IJ SOLN
INTRAMUSCULAR | Status: AC
  Filled 2020-05-02: qty 1

## 2020-05-02 NOTE — Progress Notes (Signed)
I received notification from our cancer center social worker that patient's sister is having a hard time caring for them in the home.  She is not quite ready for patient to be placed into nursing facility.  She is requesting assistance at home. Patient has increased need for disease process education, assistance with activities of daily living and access to community resources.  I have reached out to [facility] and they are willing to accept her.   

## 2020-05-03 ENCOUNTER — Emergency Department (HOSPITAL_COMMUNITY): Payer: Medicare PPO

## 2020-05-03 ENCOUNTER — Other Ambulatory Visit: Payer: Self-pay

## 2020-05-03 ENCOUNTER — Emergency Department (HOSPITAL_COMMUNITY)
Admission: EM | Admit: 2020-05-03 | Discharge: 2020-05-03 | Disposition: A | Discharge status: Home / Self Care | Payer: Medicare PPO | Attending: Emergency Medicine | Admitting: Emergency Medicine

## 2020-05-03 ENCOUNTER — Encounter (HOSPITAL_COMMUNITY): Payer: Self-pay

## 2020-05-03 DIAGNOSIS — N3000 Acute cystitis without hematuria: Secondary | ICD-10-CM

## 2020-05-03 DIAGNOSIS — R197 Diarrhea, unspecified: Secondary | ICD-10-CM | POA: Insufficient documentation

## 2020-05-03 DIAGNOSIS — R42 Dizziness and giddiness: Secondary | ICD-10-CM | POA: Insufficient documentation

## 2020-05-03 DIAGNOSIS — Z7982 Long term (current) use of aspirin: Secondary | ICD-10-CM | POA: Insufficient documentation

## 2020-05-03 DIAGNOSIS — E039 Hypothyroidism, unspecified: Secondary | ICD-10-CM | POA: Insufficient documentation

## 2020-05-03 DIAGNOSIS — Z79899 Other long term (current) drug therapy: Secondary | ICD-10-CM | POA: Insufficient documentation

## 2020-05-03 DIAGNOSIS — Z743 Need for continuous supervision: Secondary | ICD-10-CM | POA: Diagnosis not present

## 2020-05-03 DIAGNOSIS — E119 Type 2 diabetes mellitus without complications: Secondary | ICD-10-CM | POA: Insufficient documentation

## 2020-05-03 DIAGNOSIS — R001 Bradycardia, unspecified: Secondary | ICD-10-CM | POA: Diagnosis not present

## 2020-05-03 DIAGNOSIS — W19XXXA Unspecified fall, initial encounter: Secondary | ICD-10-CM | POA: Diagnosis not present

## 2020-05-03 DIAGNOSIS — R9431 Abnormal electrocardiogram [ECG] [EKG]: Secondary | ICD-10-CM | POA: Diagnosis not present

## 2020-05-03 DIAGNOSIS — R55 Syncope and collapse: Secondary | ICD-10-CM

## 2020-05-03 DIAGNOSIS — I959 Hypotension, unspecified: Secondary | ICD-10-CM | POA: Diagnosis not present

## 2020-05-03 DIAGNOSIS — N179 Acute kidney failure, unspecified: Secondary | ICD-10-CM | POA: Insufficient documentation

## 2020-05-03 DIAGNOSIS — R519 Headache, unspecified: Secondary | ICD-10-CM | POA: Diagnosis not present

## 2020-05-03 DIAGNOSIS — R410 Disorientation, unspecified: Secondary | ICD-10-CM | POA: Diagnosis not present

## 2020-05-03 DIAGNOSIS — G44309 Post-traumatic headache, unspecified, not intractable: Secondary | ICD-10-CM | POA: Diagnosis not present

## 2020-05-03 DIAGNOSIS — R531 Weakness: Secondary | ICD-10-CM | POA: Diagnosis not present

## 2020-05-03 LAB — CBC WITH DIFFERENTIAL/PLATELET
Abs Immature Granulocytes: 0.04 10*3/uL (ref 0.00–0.07)
Basophils Absolute: 0 10*3/uL (ref 0.0–0.1)
Basophils Relative: 0 %
Eosinophils Absolute: 0.2 10*3/uL (ref 0.0–0.5)
Eosinophils Relative: 2 %
HCT: 29.1 % — ABNORMAL LOW (ref 36.0–46.0)
Hemoglobin: 9.3 g/dL — ABNORMAL LOW (ref 12.0–15.0)
Immature Granulocytes: 1 %
Lymphocytes Relative: 5 %
Lymphs Abs: 0.4 10*3/uL — ABNORMAL LOW (ref 0.7–4.0)
MCH: 33.8 pg (ref 26.0–34.0)
MCHC: 32 g/dL (ref 30.0–36.0)
MCV: 105.8 fL — ABNORMAL HIGH (ref 80.0–100.0)
Monocytes Absolute: 0.6 10*3/uL (ref 0.1–1.0)
Monocytes Relative: 7 %
Neutro Abs: 7.3 10*3/uL (ref 1.7–7.7)
Neutrophils Relative %: 85 %
Platelets: 69 10*3/uL — ABNORMAL LOW (ref 150–400)
RBC: 2.75 MIL/uL — ABNORMAL LOW (ref 3.87–5.11)
RDW: 17.9 % — ABNORMAL HIGH (ref 11.5–15.5)
WBC: 8.5 10*3/uL (ref 4.0–10.5)
nRBC: 0 % (ref 0.0–0.2)

## 2020-05-03 LAB — URINALYSIS, ROUTINE W REFLEX MICROSCOPIC
Bilirubin Urine: NEGATIVE
Glucose, UA: NEGATIVE mg/dL
Hgb urine dipstick: NEGATIVE
Ketones, ur: NEGATIVE mg/dL
Leukocytes,Ua: NEGATIVE
Nitrite: POSITIVE — AB
Protein, ur: NEGATIVE mg/dL
Specific Gravity, Urine: 1.025 (ref 1.005–1.030)
pH: 5.5 (ref 5.0–8.0)

## 2020-05-03 LAB — BASIC METABOLIC PANEL
Anion gap: 5 (ref 5–15)
BUN: 37 mg/dL — ABNORMAL HIGH (ref 8–23)
CO2: 18 mmol/L — ABNORMAL LOW (ref 22–32)
Calcium: 8.8 mg/dL — ABNORMAL LOW (ref 8.9–10.3)
Chloride: 116 mmol/L — ABNORMAL HIGH (ref 98–111)
Creatinine, Ser: 1.67 mg/dL — ABNORMAL HIGH (ref 0.44–1.00)
GFR calc Af Amer: 34 mL/min — ABNORMAL LOW (ref >60)
GFR calc non Af Amer: 30 mL/min — ABNORMAL LOW (ref >60)
Glucose, Bld: 102 mg/dL — ABNORMAL HIGH (ref 70–99)
Potassium: 3.6 mmol/L (ref 3.5–5.1)
Sodium: 139 mmol/L (ref 135–145)

## 2020-05-03 LAB — URINALYSIS, MICROSCOPIC (REFLEX)

## 2020-05-03 MED ORDER — CEPHALEXIN 500 MG PO CAPS
500.0000 mg | ORAL_CAPSULE | Freq: Once | ORAL | Status: AC
  Administered 2020-05-03: 500 mg via ORAL
  Filled 2020-05-03: qty 1

## 2020-05-03 MED ORDER — ONDANSETRON HCL 4 MG/2ML IJ SOLN
4.0000 mg | Freq: Once | INTRAMUSCULAR | Status: DC
  Filled 2020-05-03: qty 2

## 2020-05-03 MED ORDER — SODIUM CHLORIDE 0.9 % IV BOLUS
1000.0000 mL | Freq: Once | INTRAVENOUS | Status: AC
  Administered 2020-05-03: 1000 mL via INTRAVENOUS

## 2020-05-03 MED ORDER — CEPHALEXIN 500 MG PO CAPS: 500.0000 mg | ORAL_CAPSULE | Freq: Three times a day (TID) | ORAL | 0 refills | Status: AC

## 2020-05-03 NOTE — ED Provider Notes (Signed)
[facility] EMERGENCY DEPARTMENT Provider Note   CSN: [id number] Arrival date & time: 05/03/20  1208     History Chief Complaint  Patient presents with  . Weakness    Amanda Wu is a 74 y.o. female possible history of multiple myeloma (last chemotherapy last Tuesday), hypertension, diabetes, hypokalemia who presents for evaluation of syncopal episode.  Patient reports that she woke up this morning few hours ago and states that when she went to get out of bed, she felt lightheaded and dizzy like she was going to pass out.  She reports that she passed out and fell and hit the floor.  Unsure if she hit her head.  Unsure of exactly what time this happened.  She had no preceding chest pain.  She states her sister found her and called EMS.  She does live with her sisters.  She currently denies any pain.  She reports she has felt generalized weakness over the last several weeks but states that this is typical for her.  She does feel like it is slightly worsening and she does not have any energy.  She states she has been eating and drinking appropriately and has not been having any nausea/vomiting.  She does report that she has been having diarrhea which has been a constant issue for her.  No blood in stools.  She denies any blood thinner use.  Denies any vision changes, chest pain, difficulty breathing, numbness/weakness of arms or legs, abdominal pain, nausea/vomiting.  I discussed with patient's sister who witnessed the episode.  She states the patient had had some diarrhea and was on the commode.  She reports that afterwards, she got went up to stand up and had a syncopal episode.  It only lasted for a few seconds.  She does report that she has had diarrhea but states is been a chronic issue.  No vomiting.  The sister does not feel like she has been eating and drinking as much as she needs to be.  She has been working with a primary care doctor to get home health to help with mobility and health  needs.  She states that she has not established.   The history is provided by the patient.       Past Medical History:  Diagnosis Date  . Benign hypertension   . Cataract   . 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 Diabetes mellitus without complication ([place])   . H/O partial nephrectomy   . Hypokalemia   . Hypothyroidism   . Impaired cognition   . Multiple myeloma (HCC)    Dr Maylon Peppers, [facility]  . Thyroid disease     Patient Active Problem List   Diagnosis Date Noted  . Goals of care, counseling/discussion 01/07/2020  . Altered mental status 11/10/2017  . Mild cognitive impairment with memory loss 07/15/2017  . E-coli UTI 07/13/2017  . Syncope 07/09/2017  . Multiple myeloma ([place]) 07/09/2017  . Benign hypertension 07/09/2017  . Hypothyroidism 07/09/2017  . Diabetes mellitus without complication ([place]) 95/32/0233  . Elevated troponin 07/09/2017  . Lumbar compression fracture ([place]) 07/09/2017  . Thrombocytopenia ([place]) 07/09/2017    Past Surgical History:  Procedure Laterality Date  . ABDOMINAL HYSTERECTOMY    . CRANIOTOMY     for lymphoma  . YAG LASER APPLICATION Right 4/35/6861   Procedure: YAG LASER APPLICATION;  Surgeon: [name], MD;  Location: [facility];  Service: Ophthalmology;  Laterality: Right;     OB History   No  obstetric history on file.     Family History  Problem Relation Age of Onset  . Depression Mother   . Diabetes Mother   . Heart disease Father   . Cancer - Prostate Brother   . Cancer Brother   . Cancer Sister     Social History   Tobacco Use  . Smoking status: Never Smoker  . Smokeless tobacco: Never Used  Substance Use Topics  . Alcohol use: No  . Drug use: No    Home Medications Prior to Admission medications   Medication Sig Start Date End Date Taking? Authorizing Provider  ACCU-CHEK SMARTVIEW test strip  08/27/18  Yes [provider]  Accu-Chek Softclix Lancets lancets  10/18/19  Yes [provider]   acetaminophen (TYLENOL) 500 MG tablet Take 1,000 mg by mouth every 6 (six) hours as needed for mild pain.    Yes [provider]  acyclovir (ZOVIRAX) 400 MG tablet TAKE 1 TABLET BY MOUTH TWICE DAILY Patient taking differently: Take 400 mg by mouth daily.  04/10/20  Yes Derek Jack, MD  amLODipine (NORVASC) 5 MG tablet Take 5 mg by mouth daily.  03/01/20  Yes [provider]  aspirin EC 81 MG tablet Take 81 mg by mouth daily.   Yes [provider]  bortezomib IV (VELCADE) 3.5 MG injection Inject into the vein.   Yes [provider]  dexamethasone (DECADRON) 4 MG tablet TAKE TWO AND ONE-HALF TABLETS BY MOUTH WEEKLY FOR THREE WEEKS (THREE WEEKS ON, ONE WEEK OFF) IN A ROW AND REPEAT MONTHLY 03/04/18  Yes [provider]  ferrous sulfate 325 (65 FE) MG tablet Take 325 mg by mouth daily with breakfast.   Yes [provider]  lenalidomide (REVLIMID) 10 MG capsule TAKE 1 CAPSULE BY MOUTH  DAILY FOR 14 DAYS ON THEN  14 DAYS OFF 04/18/20  Yes Lockamy, Randi L, NP-C  levothyroxine (SYNTHROID, LEVOTHROID) 25 MCG tablet Take 25 mcg by mouth daily before breakfast.   Yes [provider]  losartan (COZAAR) 100 MG tablet Take 50 mg by mouth daily.    Yes [provider]  Multiple Vitamins-Minerals (MULTIVITAMIN WOMEN 50+ PO) Take by mouth. 12/26/10  Yes [provider]  potassium chloride (KLOR-CON) 10 MEQ tablet Takes two tablets daily 04/18/20  Yes Lockamy, Randi L, NP-C  calcium-vitamin D (OSCAL WITH D) 500-200 MG-UNIT tablet Take 1 tablet by mouth daily with breakfast.    [provider]  cephALEXin (KEFLEX) 500 MG capsule Take 1 capsule (500 mg total) by mouth 3 (three) times daily for 7 days. 05/03/20 05/10/20  [place] Lanius A, PA-C  donepezil (ARICEPT) 10 MG tablet TAKE 1 TABLET BY MOUTH every evening Patient not taking: TAKE 1 TABLET BY MOUTH every evening 04/18/20   Lockamy, Randi L, NP-C    Allergies    Patient  has no active allergies.  Review of Systems   Review of Systems  Constitutional: Negative for fever.  Respiratory: Negative for shortness of breath.   Cardiovascular: Negative for chest pain.  Gastrointestinal: Positive for diarrhea. Negative for abdominal pain, blood in stool, nausea and vomiting.  Genitourinary: Negative for dysuria and hematuria.  Neurological: Positive for syncope and weakness. Negative for headaches.  All other systems reviewed and are negative.   Physical Exam Updated Vital Signs BP 112/64   Pulse (!) 52   Temp 98 F (36.7 C) (Oral)   Resp (!) 21   Wt 49.8 kg   SpO2 100%   BMI  16.21 kg/m   Physical Exam Vitals and nursing note reviewed.  Constitutional:      Appearance: Normal appearance. She is well-developed.     Comments: Frail and elderly appearing  HENT:     Head: Normocephalic and atraumatic.  Eyes:     General: Lids are normal.     Conjunctiva/sclera: Conjunctivae normal.     Pupils: Pupils are equal, round, and reactive to light.     Comments: PERRL. EOMs intact. No nystagmus. No neglect.   Cardiovascular:     Rate and Rhythm: Normal rate and regular rhythm.     Pulses: Normal pulses.          Radial pulses are 2+ on the right side and 2+ on the left side.     Heart sounds: Normal heart sounds. No murmur. No friction rub. No gallop.   Pulmonary:     Effort: Pulmonary effort is normal.     Breath sounds: Normal breath sounds.     Comments: Lungs clear to auscultation bilaterally.  Symmetric chest rise.  No wheezing, rales, rhonchi. Abdominal:     Palpations: Abdomen is soft. Abdomen is not rigid.     Tenderness: There is no abdominal tenderness. There is no guarding.     Comments: Abdomen is soft, non-distended, non-tender. No rigidity, No guarding. No peritoneal signs.  Musculoskeletal:        General: Normal range of motion.     Cervical back: Full passive range of motion without pain.  Skin:    General: Skin is warm and dry.      Capillary Refill: Capillary refill takes less than 2 seconds.  Neurological:     Mental Status: She is alert and oriented to person, place, and time.     Comments: Alert and oriented x3. 5/5 strength of BUE and BLE CN III-XII Sensation intact along major dermatome distributions.   Psychiatric:        Speech: Speech normal.     ED Results / Procedures / Treatments   Labs (all labs ordered are listed, but only abnormal results are displayed) Labs Reviewed  BASIC METABOLIC PANEL - Abnormal; Notable for the following components:      Result Value   Chloride 116 (*)    CO2 18 (*)    Glucose, Bld 102 (*)    BUN 37 (*)    Creatinine, Ser 1.67 (*)    Calcium 8.8 (*)    GFR calc non Af Amer 30 (*)    GFR calc Af Amer 34 (*)    All other components within normal limits  CBC WITH DIFFERENTIAL/PLATELET - Abnormal; Notable for the following components:   RBC 2.75 (*)    Hemoglobin 9.3 (*)    HCT 29.1 (*)    MCV 105.8 (*)    RDW 17.9 (*)    Platelets 69 (*)    Lymphs Abs 0.4 (*)    All other components within normal limits  URINALYSIS, ROUTINE W REFLEX MICROSCOPIC - Abnormal; Notable for the following components:   Color, Urine STRAW (*)    Nitrite POSITIVE (*)    All other components within normal limits  URINALYSIS, MICROSCOPIC (REFLEX) - Abnormal; Notable for the following components:   Bacteria, UA FEW (*)    All other components within normal limits  URINE CULTURE    EKG EKG Interpretation  Date/Time:  Thursday May 03 2020 12:19:06 EDT Ventricular Rate:  77 PR Interval:    QRS Duration: 96 QT Interval:  413 QTC Calculation: 377 R Axis:   47 Text Interpretation: Sinus rhythm Ventricular bigeminy Borderline repolarization abnormality Baseline wander in lead(s) I II aVR Confirmed by Fredia Sorrow [phone]) on 05/03/2020 1:09:33 PM   Radiology CT Head Wo Contrast  Result Date: 05/03/2020 CLINICAL DATA:  Posttraumatic headache. EXAM: CT HEAD WITHOUT CONTRAST CT  CERVICAL SPINE WITHOUT CONTRAST TECHNIQUE: Multidetector CT imaging of the head and cervical spine was performed following the standard protocol without intravenous contrast. Multiplanar CT image reconstructions of the cervical spine were also generated. COMPARISON:  July 09, 2017. FINDINGS: CT HEAD FINDINGS Brain: Stable bifrontal encephalomalacia is noted consistent with old infarction. No mass effect or midline shift is noted. Ventricular size is within normal limits. There is no evidence of mass lesion, hemorrhage or acute infarction. Vascular: No hyperdense vessel or unexpected calcification. Skull: Status post left frontal craniotomy. Stable lucencies are noted throughout the skull which may be related to history of multiple myeloma. Sinuses/Orbits: No acute finding. Other: None. CT CERVICAL SPINE FINDINGS Alignment: Normal. Skull base and vertebrae: No acute fracture. No primary bone lesion or focal pathologic process. Soft tissues and spinal canal: No prevertebral fluid or swelling. No visible canal hematoma. Disc levels: Moderate degenerative disc disease is noted at C4-5, C5-6 and C6-7 with anterior osteophyte formation. Upper chest: Negative. Other: None. IMPRESSION: 1. Stable bifrontal encephalomalacia is noted consistent with old infarction. No acute intracranial abnormality seen. 2. Moderate multilevel degenerative disc disease. No acute abnormality seen in the cervical spine. Electronically Signed   By: Marijo Conception M.D.   On: 05/03/2020 13:58   CT Cervical Spine Wo Contrast  Result Date: 05/03/2020 CLINICAL DATA:  Posttraumatic headache. EXAM: CT HEAD WITHOUT CONTRAST CT CERVICAL SPINE WITHOUT CONTRAST TECHNIQUE: Multidetector CT imaging of the head and cervical spine was performed following the standard protocol without intravenous contrast. Multiplanar CT image reconstructions of the cervical spine were also generated. COMPARISON:  July 09, 2017. FINDINGS: CT HEAD FINDINGS Brain: Stable  bifrontal encephalomalacia is noted consistent with old infarction. No mass effect or midline shift is noted. Ventricular size is within normal limits. There is no evidence of mass lesion, hemorrhage or acute infarction. Vascular: No hyperdense vessel or unexpected calcification. Skull: Status post left frontal craniotomy. Stable lucencies are noted throughout the skull which may be related to history of multiple myeloma. Sinuses/Orbits: No acute finding. Other: None. CT CERVICAL SPINE FINDINGS Alignment: Normal. Skull base and vertebrae: No acute fracture. No primary bone lesion or focal pathologic process. Soft tissues and spinal canal: No prevertebral fluid or swelling. No visible canal hematoma. Disc levels: Moderate degenerative disc disease is noted at C4-5, C5-6 and C6-7 with anterior osteophyte formation. Upper chest: Negative. Other: None. IMPRESSION: 1. Stable bifrontal encephalomalacia is noted consistent with old infarction. No acute intracranial abnormality seen. 2. Moderate multilevel degenerative disc disease. No acute abnormality seen in the cervical spine. Electronically Signed   By: Marijo Conception M.D.   On: 05/03/2020 13:58    Procedures Procedures (including critical care time)  Medications Ordered in ED Medications  ondansetron (ZOFRAN) injection 4 mg (4 mg Intravenous Refused 05/03/20 1500)  cephALEXin (KEFLEX) capsule 500 mg (has no administration in time range)  sodium chloride 0.9 % bolus 1,000 mL (0 mLs Intravenous Stopped 05/03/20 1626)    ED Course  I have reviewed the triage vital signs and the nursing notes.  Pertinent labs & imaging results that were available during my care of the patient were reviewed by me and  considered in my medical decision making (see chart for details).    MDM Rules/Calculators/A&P                      74 year old female past history dementia, multiple myeloma who presents for evaluation of syncope.  Patient initially told me that she fell  at home.  When I talk to sister, sister witnessed the fall and states that patient had had a bowel movement and got up from the toilet and had a syncopal episode.  Patient with history of multiply Lyme myeloma and is actively getting chemo.  She does have chronic diarrhea.  On initially arrival, she is afebrile, nontoxic-appearing.  She is alert and oriented x3.  She has no abdominal tenderness and has no complaints at this time.  We will plan to check basic labs.  CBC shows no leukocytosis.  Hemoglobin stable at 9.3.  BMP shows BUN of 37, creatinine 1.67.  Her normal creatinine is between 1.2-1.3.  Patient given fluid bolus.  I discussed at length with patient sister, Amanda Wu, who lives with patient.  Patient has history of chronic diarrhea.  She has been having some decline but sister has been working with primary care doctor to get home health nurse at home to help.  UA does show nitrites, bacteria.  This was given via cath so we will plan to treat.  Patient given fluids.  Will instruct patient to have her creatinine rechecked.  At this time, patient is ambulatory, with no signs of distress.  Patient stable for discharge at this time. Portions of this note were generated with Lobbyist. Dictation errors may occur despite best attempts at proofreading.   Final Clinical Impression(s) / ED Diagnoses Final diagnoses:  AKI (acute kidney injury) ([place])  Acute cystitis without hematuria  Syncope, unspecified syncope type    Rx / DC Orders ED Discharge Orders         Ordered    cephALEXin (KEFLEX) 500 MG capsule  3 times daily     05/03/20 2124           Desma Mcgregor 05/03/20 2212    Daleen Bo, MD 05/04/20 1736

## 2020-05-03 NOTE — ED Provider Notes (Signed)
  Face-to-face evaluation   History: She presents for evaluation of syncope, and diarrhea.  The diarrhea is chronic.  She is also decreased appetite recently.  Physical exam: Weight, alert, cooperative.  She is not uncomfortable.  Abdomen soft and nontender.  There is no dysarthria or aphasia.  She is confused.  She moves all extremities equally.  Medical screening examination/treatment/procedure(s) were conducted as a shared visit with non-physician practitioner(s) and myself.  I personally evaluated the patient during the encounter    Daleen Bo, MD 05/04/20 1736

## 2020-05-03 NOTE — ED Notes (Signed)
Pt. Took their IV out. Pt. Pulled off their ECG wires. Pt. Also keeps asking my name even after just stating. Pt. Recognizes me just can't remember my name.

## 2020-05-03 NOTE — Discharge Instructions (Addendum)
Your kidney function was slightly elevated here.  You have been given fluids.  You should not drink and make sure you are staying hydrated at home.  Have your primary care doctor recheck your kidney function in about a week.  Additionally, he had a small UTI.  We have prescribed you antibiotics.  Return the emergency department for any vomiting, difficulty breathing, abdominal pain or any other worsening concerning symptoms.

## 2020-05-03 NOTE — ED Triage Notes (Signed)
Pt brought in by EMS. Pt "blacked out" and fell. Ems reports that pt has multiple myeloma and receives chemo. Last dose was last Tuesday. Pt has not been eating , having diarrhea, and very weak. Pt reports she has a sore on her bottom.  

## 2020-05-03 NOTE — ED Notes (Signed)
Pt is aware we need urine sample.  

## 2020-05-06 DIAGNOSIS — F0390 Unspecified dementia without behavioral disturbance: Secondary | ICD-10-CM | POA: Diagnosis not present

## 2020-05-06 DIAGNOSIS — R55 Syncope and collapse: Secondary | ICD-10-CM | POA: Diagnosis not present

## 2020-05-06 DIAGNOSIS — H409 Unspecified glaucoma: Secondary | ICD-10-CM | POA: Diagnosis not present

## 2020-05-06 DIAGNOSIS — E876 Hypokalemia: Secondary | ICD-10-CM | POA: Diagnosis not present

## 2020-05-06 DIAGNOSIS — E119 Type 2 diabetes mellitus without complications: Secondary | ICD-10-CM | POA: Diagnosis not present

## 2020-05-06 DIAGNOSIS — I10 Essential (primary) hypertension: Secondary | ICD-10-CM | POA: Diagnosis not present

## 2020-05-06 DIAGNOSIS — L89150 Pressure ulcer of sacral region, unstageable: Secondary | ICD-10-CM | POA: Diagnosis not present

## 2020-05-06 DIAGNOSIS — C8580 Other specified types of non-Hodgkin lymphoma, unspecified site: Secondary | ICD-10-CM | POA: Diagnosis not present

## 2020-05-06 DIAGNOSIS — C9000 Multiple myeloma not having achieved remission: Secondary | ICD-10-CM | POA: Diagnosis not present

## 2020-05-06 LAB — URINE CULTURE: Culture: >=100000 — AB

## 2020-05-07 ENCOUNTER — Telehealth: Payer: Self-pay | Admitting: *Deleted

## 2020-05-07 NOTE — Telephone Encounter (Signed)
Post ED Visit - Positive Culture Follow-up  Culture report reviewed by antimicrobial stewardship pharmacist: [place] Team []  Elenor Quinones, Pharm.D. []  Heide Guile, Pharm.D., BCPS AQ-ID []  Parks Neptune, Pharm.D., BCPS []  Alycia Rossetti, Pharm.D., BCPS []  [place], [place].D., BCPS, AAHIVP []  Legrand Como, Pharm.D., BCPS, AAHIVP []  Salome Arnt, PharmD, BCPS []  Johnnette Gourd, PharmD, BCPS []  Hughes Better, PharmD, BCPS []  Leeroy Cha, PharmD []  Laqueta Linden, PharmD, BCPS [x]  Albertina Parr, PharmD  [place] Team []  Leodis Sias, PharmD []  Lindell Spar, PharmD []  Royetta Asal, PharmD []  Graylin Shiver, Rph []  Rema Fendt) Glennon Mac, PharmD []  Arlyn Dunning, PharmD []  Netta Cedars, PharmD []  Dia Sitter, PharmD []  Leone [place], PharmD []  Gretta Arab, PharmD []  Theodis Shove, PharmD []  Peggyann Juba, PharmD []  Reuel Boom, PharmD   Positive urine culture Treated with Cephalexin, organism sensitive to the same and no further patient follow-up is required at this time.  Harlon Flor [facility] 05/07/2020, 11:08 AM

## 2020-05-07 NOTE — Telephone Encounter (Signed)
Post ED Visit - Positive Culture Follow-up  Culture report reviewed by antimicrobial stewardship pharmacist: [place] Team []  Elenor Quinones, Pharm.D. []  Heide Guile, Pharm.D., BCPS AQ-ID []  Parks Neptune, Pharm.D., BCPS []  Alycia Rossetti, Pharm.D., BCPS []  [place], Pharm.D., BCPS, AAHIVP []  Legrand Como, Pharm.D., BCPS, AAHIVP []  Salome Arnt, PharmD, BCPS []  Johnnette Gourd, PharmD, BCPS []  Hughes Better, PharmD, BCPS []  Leeroy Cha, PharmD []  Laqueta Linden, PharmD, BCPS [x]  Albertina Parr, PharmD  [place] Team []  Leodis Sias, PharmD []  Lindell Spar, PharmD []  Royetta Asal, PharmD []  Graylin Shiver, Rph []  Rema Fendt) Glennon Mac, PharmD []  Arlyn Dunning, PharmD []  Netta Cedars, PharmD []  Dia Sitter, PharmD []  Leone [place], PharmD []  Gretta Arab, PharmD []  Theodis Shove, PharmD []  Peggyann Juba, PharmD []  Reuel Boom, PharmD   Positive urine culture Treated with Cephalexin, organism sensitive to the same and no further patient follow-up is required at this time.  Harlon Flor [facility] 05/07/2020, 11:10 AM

## 2020-05-08 ENCOUNTER — Telehealth (HOSPITAL_COMMUNITY): Payer: Self-pay | Admitting: *Deleted

## 2020-05-08 DIAGNOSIS — E876 Hypokalemia: Secondary | ICD-10-CM | POA: Diagnosis not present

## 2020-05-08 DIAGNOSIS — C9000 Multiple myeloma not having achieved remission: Secondary | ICD-10-CM | POA: Diagnosis not present

## 2020-05-08 DIAGNOSIS — I10 Essential (primary) hypertension: Secondary | ICD-10-CM | POA: Diagnosis not present

## 2020-05-08 DIAGNOSIS — L89150 Pressure ulcer of sacral region, unstageable: Secondary | ICD-10-CM | POA: Diagnosis not present

## 2020-05-08 DIAGNOSIS — E119 Type 2 diabetes mellitus without complications: Secondary | ICD-10-CM | POA: Diagnosis not present

## 2020-05-08 DIAGNOSIS — C8580 Other specified types of non-Hodgkin lymphoma, unspecified site: Secondary | ICD-10-CM | POA: Diagnosis not present

## 2020-05-08 DIAGNOSIS — R55 Syncope and collapse: Secondary | ICD-10-CM | POA: Diagnosis not present

## 2020-05-08 DIAGNOSIS — F0390 Unspecified dementia without behavioral disturbance: Secondary | ICD-10-CM | POA: Diagnosis not present

## 2020-05-08 DIAGNOSIS — H409 Unspecified glaucoma: Secondary | ICD-10-CM | POA: Diagnosis not present

## 2020-05-08 NOTE — Telephone Encounter (Signed)
Verbal order given from Dr. Delton Coombes to Lurline Hare from [facility] for this pt. Pt is to received nursing services and dressing changes to wound on pt's coccyx area.

## 2020-05-10 DIAGNOSIS — E119 Type 2 diabetes mellitus without complications: Secondary | ICD-10-CM | POA: Diagnosis not present

## 2020-05-10 DIAGNOSIS — C8580 Other specified types of non-Hodgkin lymphoma, unspecified site: Secondary | ICD-10-CM | POA: Diagnosis not present

## 2020-05-10 DIAGNOSIS — R55 Syncope and collapse: Secondary | ICD-10-CM | POA: Diagnosis not present

## 2020-05-10 DIAGNOSIS — F0390 Unspecified dementia without behavioral disturbance: Secondary | ICD-10-CM | POA: Diagnosis not present

## 2020-05-10 DIAGNOSIS — I10 Essential (primary) hypertension: Secondary | ICD-10-CM | POA: Diagnosis not present

## 2020-05-10 DIAGNOSIS — C9000 Multiple myeloma not having achieved remission: Secondary | ICD-10-CM | POA: Diagnosis not present

## 2020-05-10 DIAGNOSIS — H409 Unspecified glaucoma: Secondary | ICD-10-CM | POA: Diagnosis not present

## 2020-05-10 DIAGNOSIS — E876 Hypokalemia: Secondary | ICD-10-CM | POA: Diagnosis not present

## 2020-05-10 DIAGNOSIS — L89150 Pressure ulcer of sacral region, unstageable: Secondary | ICD-10-CM | POA: Diagnosis not present

## 2020-05-11 ENCOUNTER — Other Ambulatory Visit (HOSPITAL_COMMUNITY): Payer: Self-pay | Admitting: *Deleted

## 2020-05-11 ENCOUNTER — Encounter (HOSPITAL_COMMUNITY): Payer: Self-pay

## 2020-05-11 ENCOUNTER — Encounter (HOSPITAL_COMMUNITY): Payer: Medicare PPO

## 2020-05-11 DIAGNOSIS — C9000 Multiple myeloma not having achieved remission: Secondary | ICD-10-CM

## 2020-05-11 NOTE — Progress Notes (Signed)
Nutrition  Called for nutrition phone f/u visit but no answer.  Left message on voice mail with call back number.   Supreme Rybarczyk B. Zenia Resides, [place], [place] Registered Dietitian [phone] (pager)

## 2020-05-12 ENCOUNTER — Other Ambulatory Visit (HOSPITAL_COMMUNITY): Payer: Self-pay | Admitting: Nurse Practitioner

## 2020-05-12 DIAGNOSIS — C9000 Multiple myeloma not having achieved remission: Secondary | ICD-10-CM

## 2020-05-15 ENCOUNTER — Other Ambulatory Visit: Payer: Self-pay

## 2020-05-15 ENCOUNTER — Inpatient Hospital Stay (HOSPITAL_COMMUNITY): Payer: Medicare PPO | Attending: Hematology

## 2020-05-15 ENCOUNTER — Inpatient Hospital Stay (HOSPITAL_COMMUNITY): Payer: Medicare PPO | Admitting: Hematology

## 2020-05-15 VITALS — BP 101/57 | HR 52 | Temp 96.8°F | Resp 18 | Wt 108.2 lb

## 2020-05-15 DIAGNOSIS — C9000 Multiple myeloma not having achieved remission: Secondary | ICD-10-CM | POA: Insufficient documentation

## 2020-05-15 DIAGNOSIS — Z7952 Long term (current) use of systemic steroids: Secondary | ICD-10-CM | POA: Insufficient documentation

## 2020-05-15 DIAGNOSIS — N189 Chronic kidney disease, unspecified: Secondary | ICD-10-CM | POA: Insufficient documentation

## 2020-05-15 DIAGNOSIS — Z5112 Encounter for antineoplastic immunotherapy: Secondary | ICD-10-CM | POA: Diagnosis not present

## 2020-05-15 DIAGNOSIS — Z79899 Other long term (current) drug therapy: Secondary | ICD-10-CM | POA: Diagnosis not present

## 2020-05-15 DIAGNOSIS — D539 Nutritional anemia, unspecified: Secondary | ICD-10-CM | POA: Insufficient documentation

## 2020-05-15 DIAGNOSIS — F0390 Unspecified dementia without behavioral disturbance: Secondary | ICD-10-CM | POA: Insufficient documentation

## 2020-05-15 DIAGNOSIS — I129 Hypertensive chronic kidney disease with stage 1 through stage 4 chronic kidney disease, or unspecified chronic kidney disease: Secondary | ICD-10-CM | POA: Insufficient documentation

## 2020-05-15 DIAGNOSIS — Z8572 Personal history of non-Hodgkin lymphomas: Secondary | ICD-10-CM | POA: Diagnosis not present

## 2020-05-15 DIAGNOSIS — Z923 Personal history of irradiation: Secondary | ICD-10-CM | POA: Diagnosis not present

## 2020-05-15 DIAGNOSIS — Z5111 Encounter for antineoplastic chemotherapy: Secondary | ICD-10-CM | POA: Diagnosis not present

## 2020-05-15 LAB — CBC WITH DIFFERENTIAL/PLATELET
Abs Immature Granulocytes: 0.01 10*3/uL (ref 0.00–0.07)
Basophils Absolute: 0.1 10*3/uL (ref 0.0–0.1)
Basophils Relative: 2 %
Eosinophils Absolute: 0.2 10*3/uL (ref 0.0–0.5)
Eosinophils Relative: 6 %
HCT: 26 % — ABNORMAL LOW (ref 36.0–46.0)
Hemoglobin: 8.4 g/dL — ABNORMAL LOW (ref 12.0–15.0)
Immature Granulocytes: 0 %
Lymphocytes Relative: 14 %
Lymphs Abs: 0.4 10*3/uL — ABNORMAL LOW (ref 0.7–4.0)
MCH: 34 pg (ref 26.0–34.0)
MCHC: 32.3 g/dL (ref 30.0–36.0)
MCV: 105.3 fL — ABNORMAL HIGH (ref 80.0–100.0)
Monocytes Absolute: 0.4 10*3/uL (ref 0.1–1.0)
Monocytes Relative: 12 %
Neutro Abs: 2 10*3/uL (ref 1.7–7.7)
Neutrophils Relative %: 66 %
Platelets: 105 10*3/uL — ABNORMAL LOW (ref 150–400)
RBC: 2.47 MIL/uL — ABNORMAL LOW (ref 3.87–5.11)
RDW: 17.6 % — ABNORMAL HIGH (ref 11.5–15.5)
WBC: 3 10*3/uL — ABNORMAL LOW (ref 4.0–10.5)
nRBC: 0 % (ref 0.0–0.2)

## 2020-05-15 LAB — COMPREHENSIVE METABOLIC PANEL
ALT: 38 U/L (ref 0–44)
AST: 42 U/L — ABNORMAL HIGH (ref 15–41)
Albumin: 3 g/dL — ABNORMAL LOW (ref 3.5–5.0)
Alkaline Phosphatase: 66 U/L (ref 38–126)
Anion gap: 8 (ref 5–15)
BUN: 24 mg/dL — ABNORMAL HIGH (ref 8–23)
CO2: 24 mmol/L (ref 22–32)
Calcium: 8.9 mg/dL (ref 8.9–10.3)
Chloride: 107 mmol/L (ref 98–111)
Creatinine, Ser: 1.3 mg/dL — ABNORMAL HIGH (ref 0.44–1.00)
GFR calc Af Amer: 46 mL/min — ABNORMAL LOW (ref >60)
GFR calc non Af Amer: 40 mL/min — ABNORMAL LOW (ref >60)
Glucose, Bld: 120 mg/dL — ABNORMAL HIGH (ref 70–99)
Potassium: 4.4 mmol/L (ref 3.5–5.1)
Sodium: 139 mmol/L (ref 135–145)
Total Bilirubin: 0.6 mg/dL (ref 0.3–1.2)
Total Protein: 6 g/dL — ABNORMAL LOW (ref 6.5–8.1)

## 2020-05-15 LAB — LACTATE DEHYDROGENASE: LDH: 148 U/L (ref 98–192)

## 2020-05-15 MED ORDER — DEXAMETHASONE 4 MG PO TABS
ORAL_TABLET | ORAL | Status: AC
  Filled 2020-05-15: qty 3

## 2020-05-15 MED ORDER — DEXAMETHASONE 4 MG PO TABS
10.0000 mg | ORAL_TABLET | Freq: Once | ORAL | Status: AC
  Administered 2020-05-15: 10 mg via ORAL

## 2020-05-15 MED ORDER — BORTEZOMIB CHEMO SQ INJECTION 3.5 MG (2.5MG/ML)
1.3000 mg/m2 | Freq: Once | INTRAMUSCULAR | Status: AC
  Administered 2020-05-15: 2 mg via SUBCUTANEOUS
  Filled 2020-05-15: qty 0.8

## 2020-05-15 NOTE — Progress Notes (Signed)
Amanda Wu, [place] [address]   CLINIC:  Medical Oncology/Hematology  PCP:  Monico Blitz, [place] [address]  [phone]  REASON FOR VISIT:  Follow-up for Multiple Myeloma  PRIOR THERAPY: None  CURRENT THERAPY: Rev/Velcade/Dex  INTERVAL HISTORY:  Amanda Wu, a 74 y.o. female, returns for routine follow-up for Amanda Wu Multiple Myeloma. Amanda Wu was last seen on 04/17/2020.  Amanda Wu went to [facility] for syncope with a fall with a history of difficulty eating and diarrhea. Amanda Wu does not have a good appetite and states that Amanda Wu is not hungry and does not want to eat. Amanda Wu younger sister continues to try and get Amanda Wu to eat regularly.   Amanda Wu has been tolerating treatment well.  Appetite and energy are rated as 25%.   REVIEW OF SYSTEMS:  Review of Systems  Constitutional: Positive for appetite change and unexpected weight change.  Gastrointestinal: Positive for diarrhea.  All other systems reviewed and are negative.   PAST MEDICAL/SURGICAL HISTORY:  Past Medical History:  Diagnosis Date  . Benign hypertension   . Cataract   . 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 Diabetes mellitus without complication ([place])   . H/O partial nephrectomy   . Hypokalemia   . Hypothyroidism   . Impaired cognition   . Multiple myeloma (HCC)    Dr Maylon Peppers, [facility]  . Thyroid disease    Past Surgical History:  Procedure Laterality Date  . ABDOMINAL HYSTERECTOMY    . CRANIOTOMY     for lymphoma  . YAG LASER APPLICATION Right 6/59/9357   Procedure: YAG LASER APPLICATION;  Surgeon: [name], MD;  Location: [facility];  Service: Ophthalmology;  Laterality: Right;    SOCIAL HISTORY:  Social History   Socioeconomic History  . Marital status: Single    Spouse name: Not on file  . Number of children: Not on file  . Years of education: Not on file  . Highest education level: Not on file  Occupational History  . Not on file  Tobacco Use  .  Smoking status: Never Smoker  . Smokeless tobacco: Never Used  Substance and Sexual Activity  . Alcohol use: No  . Drug use: No  . Sexual activity: Not on file  Other Topics Concern  . Not on file  Social History Narrative  . Not on file   Social Determinants of Health   Financial Resource Strain:   . Difficulty of Paying Living Expenses:   Food Insecurity:   . Worried About Charity fundraiser in the Last Year:   . Arboriculturist in the Last Year:   Transportation Needs:   . Film/video editor (Medical):   Marland Kitchen Lack of Transportation (Non-Medical):   Physical Activity:   . Days of Exercise per Week:   . Minutes of Exercise per Session:   Stress:   . Feeling of Stress :   Social Connections:   . Frequency of Communication with Friends and Family:   . Frequency of Social Gatherings with Friends and Family:   . Attends Religious Services:   . Active Member of Clubs or Organizations:   . Attends Archivist Meetings:   Marland Kitchen Marital Status:   Intimate Partner Violence:   . Fear of Current or Ex-Partner:   . Emotionally Abused:   Marland Kitchen Physically Abused:   . Sexually Abused:     FAMILY HISTORY:  Family History  Problem Relation  Age of Onset  . Depression Mother   . Diabetes Mother   . Heart disease Father   . Cancer - Prostate Brother   . Cancer Brother   . Cancer Sister     CURRENT MEDICATIONS:  Current Outpatient Medications  Medication Sig Dispense Refill  . ACCU-CHEK SMARTVIEW test strip     . Accu-Chek Softclix Lancets lancets     . acyclovir (ZOVIRAX) 400 MG tablet TAKE 1 TABLET BY MOUTH TWICE DAILY (Patient taking differently: Take 400 mg by mouth daily. ) 60 tablet 2  . amLODipine (NORVASC) 5 MG tablet Take 5 mg by mouth daily.     Marland Kitchen aspirin EC 81 MG tablet Take 81 mg by mouth daily.    . bortezomib IV (VELCADE) 3.5 MG injection Inject into the vein.    . calcium-vitamin D (OSCAL WITH D) 500-200 MG-UNIT tablet Take 1 tablet by mouth daily with  breakfast.    . dexamethasone (DECADRON) 4 MG tablet TAKE TWO AND ONE-HALF TABLETS BY MOUTH WEEKLY FOR THREE WEEKS (THREE WEEKS ON, ONE WEEK OFF) IN A ROW AND REPEAT MONTHLY  3  . donepezil (ARICEPT) 10 MG tablet TAKE 1 TABLET BY MOUTH every evening 30 tablet 3  . ferrous sulfate 325 (65 FE) MG tablet Take 325 mg by mouth daily with breakfast.    . lenalidomide (REVLIMID) 10 MG capsule TAKE 1 CAPSULE BY MOUTH  DAILY FOR 14 DAYS ON THEN  14 DAYS OFF 14 capsule 0  . levothyroxine (SYNTHROID, LEVOTHROID) 25 MCG tablet Take 25 mcg by mouth daily before breakfast.    . losartan (COZAAR) 100 MG tablet Take 50 mg by mouth daily.     . Multiple Vitamins-Minerals (MULTIVITAMIN WOMEN 50+ PO) Take by mouth.    . potassium chloride (KLOR-CON) 10 MEQ tablet Takes two tablets daily 90 tablet 5  . acetaminophen (TYLENOL) 500 MG tablet Take 1,000 mg by mouth every 6 (six) hours as needed for mild pain.      No current facility-administered medications for this visit.   Facility-Administered Medications Ordered in Other Visits  Medication Dose Route Frequency Provider Last Rate Last Admin  . prochlorperazine (COMPAZINE) tablet 10 mg  10 mg Oral Once Derek Jack, MD        ALLERGIES:  No Active Allergies  PHYSICAL EXAM:  Performance status (ECOG): 1 - Symptomatic but completely ambulatory  Vitals:   05/15/20 1105  BP: (!) 101/57  Pulse: (!) 52  Resp: 18  Temp: (!) 96.8 F (36 C)  SpO2: 100%   Wt Readings from Last 3 Encounters:  05/15/20 108 lb 3.2 oz (49.1 kg)  05/03/20 109 lb 12.6 oz (49.8 kg)  04/17/20 109 lb 12.8 oz (49.8 kg)   Physical Exam Vitals reviewed.  Constitutional:      Appearance: Normal appearance.  Cardiovascular:     Rate and Rhythm: Normal rate and regular rhythm.     Heart sounds: Normal heart sounds.  Pulmonary:     Effort: Pulmonary effort is normal.     Breath sounds: Normal breath sounds.  Neurological:     Mental Status: Amanda Wu is alert.  Psychiatric:         Mood and Affect: Mood normal.        Behavior: Behavior normal.     LABORATORY DATA:  I have reviewed the labs as listed.  CBC Latest Ref Rng & Units 05/03/2020 05/01/2020 04/24/2020  WBC 4.0 - 10.5 K/uL 8.5 3.5(L) 3.0(L)  Hemoglobin 12.0 - 15.0  g/dL 9.3(L) 9.0(L) 8.8(L)  Hematocrit 36.0 - 46.0 % 29.1(L) 28.1(L) 28.1(L)  Platelets 150 - 400 K/uL 69(L) 81(L) 91(L)   CMP Latest Ref Rng & Units 05/15/2020 05/03/2020 05/01/2020  Glucose 70 - 99 mg/dL 120(H) 102(H) 110(H)  BUN 8 - 23 mg/dL 24(H) 37(H) 22  Creatinine 0.44 - 1.00 mg/dL 1.30(H) 1.67(H) 1.14(H)  Sodium 135 - 145 mmol/L 139 139 140  Potassium 3.5 - 5.1 mmol/L 4.4 3.6 4.3  Chloride 98 - 111 mmol/L 107 116(H) 108  CO2 22 - 32 mmol/L 24 18(L) 21(L)  Calcium 8.9 - 10.3 mg/dL 8.9 8.8(L) 8.7(L)  Total Protein 6.5 - 8.1 g/dL 6.0(L) - 5.9(L)  Total Bilirubin 0.3 - 1.2 mg/dL 0.6 - 0.6  Alkaline Phos 38 - 126 U/L 66 - 82  AST 15 - 41 U/L 42(H) - 55(H)  ALT 0 - 44 U/L 38 - 57(H)      Component Value Date/Time   RBC 2.75 (L) 05/03/2020 1304   MCV 105.8 (H) 05/03/2020 1304   MCH 33.8 05/03/2020 1304   MCHC 32.0 05/03/2020 1304   RDW 17.9 (H) 05/03/2020 1304   LYMPHSABS 0.4 (L) 05/03/2020 1304   MONOABS 0.6 05/03/2020 1304   EOSABS 0.2 05/03/2020 1304   BASOSABS 0.0 05/03/2020 1304    DIAGNOSTIC IMAGING:  I have independently reviewed the scans and discussed with the patient. CT Head Wo Contrast  Result Date: 05/03/2020 CLINICAL DATA:  Posttraumatic headache. EXAM: CT HEAD WITHOUT CONTRAST CT CERVICAL SPINE WITHOUT CONTRAST TECHNIQUE: Multidetector CT imaging of the head and cervical spine was performed following the standard protocol without intravenous contrast. Multiplanar CT image reconstructions of the cervical spine were also generated. COMPARISON:  July 09, 2017. FINDINGS: CT HEAD FINDINGS Brain: Stable bifrontal encephalomalacia is noted consistent with old infarction. No mass effect or midline shift is noted. Ventricular  size is within normal limits. There is no evidence of mass lesion, hemorrhage or acute infarction. Vascular: No hyperdense vessel or unexpected calcification. Skull: Status post left frontal craniotomy. Stable lucencies are noted throughout the skull which may be related to history of multiple myeloma. Sinuses/Orbits: No acute finding. Other: None. CT CERVICAL SPINE FINDINGS Alignment: Normal. Skull base and vertebrae: No acute fracture. No primary bone lesion or focal pathologic process. Soft tissues and spinal canal: No prevertebral fluid or swelling. No visible canal hematoma. Disc levels: Moderate degenerative disc disease is noted at C4-5, C5-6 and C6-7 with anterior osteophyte formation. Upper chest: Negative. Other: None. IMPRESSION: 1. Stable bifrontal encephalomalacia is noted consistent with old infarction. No acute intracranial abnormality seen. 2. Moderate multilevel degenerative disc disease. No acute abnormality seen in the cervical spine. Electronically Signed   By: Marijo Conception M.D.   On: 05/03/2020 13:58   CT Cervical Spine Wo Contrast  Result Date: 05/03/2020 CLINICAL DATA:  Posttraumatic headache. EXAM: CT HEAD WITHOUT CONTRAST CT CERVICAL SPINE WITHOUT CONTRAST TECHNIQUE: Multidetector CT imaging of the head and cervical spine was performed following the standard protocol without intravenous contrast. Multiplanar CT image reconstructions of the cervical spine were also generated. COMPARISON:  July 09, 2017. FINDINGS: CT HEAD FINDINGS Brain: Stable bifrontal encephalomalacia is noted consistent with old infarction. No mass effect or midline shift is noted. Ventricular size is within normal limits. There is no evidence of mass lesion, hemorrhage or acute infarction. Vascular: No hyperdense vessel or unexpected calcification. Skull: Status post left frontal craniotomy. Stable lucencies are noted throughout the skull which may be related to history of multiple myeloma.  Sinuses/Orbits: No acute  finding. Other: None. CT CERVICAL SPINE FINDINGS Alignment: Normal. Skull base and vertebrae: No acute fracture. No primary bone lesion or focal pathologic process. Soft tissues and spinal canal: No prevertebral fluid or swelling. No visible canal hematoma. Disc levels: Moderate degenerative disc disease is noted at C4-5, C5-6 and C6-7 with anterior osteophyte formation. Upper chest: Negative. Other: None. IMPRESSION: 1. Stable bifrontal encephalomalacia is noted consistent with old infarction. No acute intracranial abnormality seen. 2. Moderate multilevel degenerative disc disease. No acute abnormality seen in the cervical spine. Electronically Signed   By: Marijo Conception M.D.   On: 05/03/2020 13:58     ASSESSMENT:  1.  IgG lambda multiple myeloma: -Amanda Wu is on Revlimid 10 mg 2 weeks on/2 weeks of along with Velcade 3 weeks on/1 week off.  Dexamethasone 10 mg on days of Velcade. -Myeloma panel on 04/17/2020 shows M spike 0.5 g.  Kappa light chains are 44.  Lambda light chains are 34.  Ratio is 1.3.  2.  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    PLAN:  1.  IgG lambda multiple myeloma: -Amanda Wu M spike on 04/17/2020 improved to 0.5 g, previously 0.6 g. -Amanda Wu is tolerating current regimen well.  Continue Velcade and Revlimid. -RTC 6 weeks with repeat labs.  2.  CKD: -Creatinine is stable around 1.3.  We have cut back on losartan to 50 mg daily.  3.  Weight loss: -He lost about 3 pounds from last visit.  Amanda Wu was encouraged to drink boost.  4.  Dementia: -Continue Aricept 10 mg daily.  5.  Shingles prophylaxis: -Continue acyclovir 400 mg twice daily.  6.  Myeloma bone disease: -Amanda Wu was treated with Zometa for several years and was discontinued.  7.  Macrocytic anemia: -Hemoglobin is between 8 and 9.  Likely from myelosuppression and CKD. -We'll consider checking anemia labs.  8.  Hypertension: -Blood pressure is 101/57. -We'll consider discontinuing  some of the antihypertensives.   Orders placed this encounter:  No orders of the defined types were placed in this encounter.    Derek Jack, MD [facility] [phone]   I, Jacqualyn Posey, am acting as a scribe for Dr. Sanda Linger.  I, Derek Jack MD, have reviewed the above documentation for accuracy and completeness, and I agree with the above.

## 2020-05-15 NOTE — Patient Instructions (Signed)
[place] at [facility] Discharge Instructions  You were seen today by Dr. Delton Coombes. He went over your recent results. It is important for you to eat calorie dense foods to increase your weight. Continued weight loss could prevent you from receiving future treatments. It is ok to eat smaller, more frequent meals. You can also supplement your meals with nutrition supplement sakes like Boost or Ensure. He will see you back in for labs and follow up.    Thank you for choosing [place] at [facility] to provide your oncology and hematology care.  To afford each patient quality time with our provider, please arrive at least 15 minutes before your scheduled appointment time.   If you have a lab appointment with the [facility] please come in thru the  Main Entrance and check in at the main information desk  You need to re-schedule your appointment should you arrive 10 or more minutes late.  We strive to give you quality time with our providers, and arriving late affects you and other patients whose appointments are after yours.  Also, if you no show three or more times for appointments you may be dismissed from the clinic at the providers discretion.     Again, thank you for choosing [facility].  Our hope is that these requests will decrease the amount of time that you wait before being seen by our physicians.       _____________________________________________________________  Should you have questions after your visit to [facility], please contact our office at [phone] between the hours of 8:00 a.m. and 4:30 p.m.  Voicemails left after 4:00 p.m. will not be returned until the following business day.  For prescription refill requests, have your pharmacy contact our office and allow 72 hours.    Cancer Center Support Programs:   > Cancer Support Group  2nd Tuesday of the month 1pm-2pm, Journey Room

## 2020-05-15 NOTE — Progress Notes (Signed)
Pt seen by Dr. Delton Coombes today and labs reviewed.  Okay to proceed with Velcade injection per MD.

## 2020-05-16 LAB — PROTEIN ELECTROPHORESIS, SERUM
A/G Ratio: 1 (ref 0.7–1.7)
Albumin ELP: 2.7 g/dL — ABNORMAL LOW (ref 2.9–4.4)
Alpha-1-Globulin: 0.2 g/dL (ref 0.0–0.4)
Alpha-2-Globulin: 0.7 g/dL (ref 0.4–1.0)
Beta Globulin: 0.6 g/dL — ABNORMAL LOW (ref 0.7–1.3)
Gamma Globulin: 1.1 g/dL (ref 0.4–1.8)
Globulin, Total: 2.7 g/dL (ref 2.2–3.9)
M-Spike, %: 0.5 g/dL — ABNORMAL HIGH
Total Protein ELP: 5.4 g/dL — ABNORMAL LOW (ref 6.0–8.5)

## 2020-05-16 LAB — KAPPA/LAMBDA LIGHT CHAINS
Kappa free light chain: 37.3 mg/L — ABNORMAL HIGH (ref 3.3–19.4)
Kappa, lambda light chain ratio: 1.26 (ref 0.26–1.65)
Lambda free light chains: 29.5 mg/L — ABNORMAL HIGH (ref 5.7–26.3)

## 2020-05-17 ENCOUNTER — Other Ambulatory Visit (HOSPITAL_COMMUNITY): Payer: Self-pay | Admitting: *Deleted

## 2020-05-17 DIAGNOSIS — E876 Hypokalemia: Secondary | ICD-10-CM | POA: Diagnosis not present

## 2020-05-17 DIAGNOSIS — C9000 Multiple myeloma not having achieved remission: Secondary | ICD-10-CM

## 2020-05-17 DIAGNOSIS — I10 Essential (primary) hypertension: Secondary | ICD-10-CM | POA: Diagnosis not present

## 2020-05-17 DIAGNOSIS — F0390 Unspecified dementia without behavioral disturbance: Secondary | ICD-10-CM | POA: Diagnosis not present

## 2020-05-17 DIAGNOSIS — H409 Unspecified glaucoma: Secondary | ICD-10-CM | POA: Diagnosis not present

## 2020-05-17 DIAGNOSIS — R55 Syncope and collapse: Secondary | ICD-10-CM | POA: Diagnosis not present

## 2020-05-17 DIAGNOSIS — L89150 Pressure ulcer of sacral region, unstageable: Secondary | ICD-10-CM | POA: Diagnosis not present

## 2020-05-17 DIAGNOSIS — C8580 Other specified types of non-Hodgkin lymphoma, unspecified site: Secondary | ICD-10-CM | POA: Diagnosis not present

## 2020-05-17 DIAGNOSIS — E119 Type 2 diabetes mellitus without complications: Secondary | ICD-10-CM | POA: Diagnosis not present

## 2020-05-17 MED ORDER — LENALIDOMIDE 10 MG PO CAPS: ORAL_CAPSULE | ORAL | 0 refills | Status: DC

## 2020-05-22 ENCOUNTER — Inpatient Hospital Stay (HOSPITAL_COMMUNITY): Payer: Medicare PPO

## 2020-05-22 ENCOUNTER — Other Ambulatory Visit: Payer: Self-pay

## 2020-05-22 VITALS — BP 92/49 | HR 63 | Temp 97.3°F | Resp 16 | Wt 104.9 lb

## 2020-05-22 DIAGNOSIS — D539 Nutritional anemia, unspecified: Secondary | ICD-10-CM | POA: Diagnosis not present

## 2020-05-22 DIAGNOSIS — Z79899 Other long term (current) drug therapy: Secondary | ICD-10-CM | POA: Diagnosis not present

## 2020-05-22 DIAGNOSIS — C9000 Multiple myeloma not having achieved remission: Secondary | ICD-10-CM | POA: Diagnosis not present

## 2020-05-22 DIAGNOSIS — Z5112 Encounter for antineoplastic immunotherapy: Secondary | ICD-10-CM | POA: Diagnosis not present

## 2020-05-22 DIAGNOSIS — I129 Hypertensive chronic kidney disease with stage 1 through stage 4 chronic kidney disease, or unspecified chronic kidney disease: Secondary | ICD-10-CM | POA: Diagnosis not present

## 2020-05-22 DIAGNOSIS — Z7952 Long term (current) use of systemic steroids: Secondary | ICD-10-CM | POA: Diagnosis not present

## 2020-05-22 DIAGNOSIS — F0390 Unspecified dementia without behavioral disturbance: Secondary | ICD-10-CM | POA: Diagnosis not present

## 2020-05-22 DIAGNOSIS — Z8572 Personal history of non-Hodgkin lymphomas: Secondary | ICD-10-CM | POA: Diagnosis not present

## 2020-05-22 DIAGNOSIS — N189 Chronic kidney disease, unspecified: Secondary | ICD-10-CM | POA: Diagnosis not present

## 2020-05-22 LAB — CBC WITH DIFFERENTIAL/PLATELET
Abs Immature Granulocytes: 0.01 10*3/uL (ref 0.00–0.07)
Basophils Absolute: 0 10*3/uL (ref 0.0–0.1)
Basophils Relative: 1 %
Eosinophils Absolute: 0.1 10*3/uL (ref 0.0–0.5)
Eosinophils Relative: 3 %
HCT: 26.3 % — ABNORMAL LOW (ref 36.0–46.0)
Hemoglobin: 8.7 g/dL — ABNORMAL LOW (ref 12.0–15.0)
Immature Granulocytes: 0 %
Lymphocytes Relative: 16 %
Lymphs Abs: 0.5 10*3/uL — ABNORMAL LOW (ref 0.7–4.0)
MCH: 34.8 pg — ABNORMAL HIGH (ref 26.0–34.0)
MCHC: 33.1 g/dL (ref 30.0–36.0)
MCV: 105.2 fL — ABNORMAL HIGH (ref 80.0–100.0)
Monocytes Absolute: 0.5 10*3/uL (ref 0.1–1.0)
Monocytes Relative: 16 %
Neutro Abs: 2.1 10*3/uL (ref 1.7–7.7)
Neutrophils Relative %: 64 %
Platelets: 92 10*3/uL — ABNORMAL LOW (ref 150–400)
RBC: 2.5 MIL/uL — ABNORMAL LOW (ref 3.87–5.11)
RDW: 17.5 % — ABNORMAL HIGH (ref 11.5–15.5)
WBC: 3.2 10*3/uL — ABNORMAL LOW (ref 4.0–10.5)
nRBC: 0 % (ref 0.0–0.2)

## 2020-05-22 LAB — COMPREHENSIVE METABOLIC PANEL
ALT: 38 U/L (ref 0–44)
AST: 43 U/L — ABNORMAL HIGH (ref 15–41)
Albumin: 3.3 g/dL — ABNORMAL LOW (ref 3.5–5.0)
Alkaline Phosphatase: 64 U/L (ref 38–126)
Anion gap: 5 (ref 5–15)
BUN: 39 mg/dL — ABNORMAL HIGH (ref 8–23)
CO2: 20 mmol/L — ABNORMAL LOW (ref 22–32)
Calcium: 8.9 mg/dL (ref 8.9–10.3)
Chloride: 114 mmol/L — ABNORMAL HIGH (ref 98–111)
Creatinine, Ser: 1.4 mg/dL — ABNORMAL HIGH (ref 0.44–1.00)
GFR calc Af Amer: 42 mL/min — ABNORMAL LOW (ref >60)
GFR calc non Af Amer: 37 mL/min — ABNORMAL LOW (ref >60)
Glucose, Bld: 101 mg/dL — ABNORMAL HIGH (ref 70–99)
Potassium: 4.5 mmol/L (ref 3.5–5.1)
Sodium: 139 mmol/L (ref 135–145)
Total Bilirubin: 0.5 mg/dL (ref 0.3–1.2)
Total Protein: 6.4 g/dL — ABNORMAL LOW (ref 6.5–8.1)

## 2020-05-22 MED ORDER — DEXAMETHASONE 4 MG PO TABS
10.0000 mg | ORAL_TABLET | Freq: Once | ORAL | Status: AC
  Administered 2020-05-22: 10 mg via ORAL
  Filled 2020-05-22: qty 3

## 2020-05-22 MED ORDER — BORTEZOMIB CHEMO SQ INJECTION 3.5 MG (2.5MG/ML)
1.3000 mg/m2 | Freq: Once | INTRAMUSCULAR | Status: AC
  Administered 2020-05-22: 2 mg via SUBCUTANEOUS
  Filled 2020-05-22: qty 0.8

## 2020-05-22 NOTE — Patient Instructions (Signed)
[facility] Discharge Instructions for Patients Receiving Chemotherapy   Beginning January 23rd 2017 lab work for the Cancer Center will be done in the  Main lab at [place] on 1st floor. If you have a lab appointment with the Cancer Center please come in thru the  Main Entrance and check in at the main information desk   Today you received the following chemotherapy agents Velcade  To help prevent nausea and vomiting after your treatment, we encourage you to take your nausea medication    If you develop nausea and vomiting, or diarrhea that is not controlled by your medication, call the clinic.  The clinic phone number is [phone]. Office hours are Monday-Friday 8:30am-5:00pm.  BELOW ARE SYMPTOMS THAT SHOULD BE REPORTED IMMEDIATELY:  *FEVER GREATER THAN 101.0 F  *CHILLS WITH OR WITHOUT FEVER  NAUSEA AND VOMITING THAT IS NOT CONTROLLED WITH YOUR NAUSEA MEDICATION  *UNUSUAL SHORTNESS OF BREATH  *UNUSUAL BRUISING OR BLEEDING  TENDERNESS IN MOUTH AND THROAT WITH OR WITHOUT PRESENCE OF ULCERS  *URINARY PROBLEMS  *BOWEL PROBLEMS  UNUSUAL RASH Items with * indicate a potential emergency and should be followed up as soon as possible. If you have an emergency after office hours please contact your primary care physician or go to the nearest emergency department.  Please call the clinic during office hours if you have any questions or concerns.   You may also contact the Patient Navigator at [phone] should you have any questions or need assistance in obtaining follow up care.      Resources For Cancer Patients and their Caregivers ? American Cancer Society: Can assist with transportation, wigs, general needs, runs Look Good Feel Better.        1-888-227-6333 ? Cancer Care: Provides financial assistance, online support groups, medication/co-pay assistance.  1-800-813-HOPE (4673) ? [facility] Assists [place] cancer  patients and their families through emotional , educational and financial support.  336-427-4357 ? [place] DSS Where to apply for food stamps, Medicaid and utility assistance. 336-342-1394 ? [facility]: Transportation to medical appointments. 336-347-2287 ? Social Security Administration: May apply for disability if have a Stage IV cancer. 336-342-7796 1-800-772-1213 ? [place] Aging, Disability and Transit Services: Assists with nutrition, care and transit needs. 336-349-2343          

## 2020-05-22 NOTE — Progress Notes (Signed)
Amanda Wu presents today for Velcade injection. Pt reports no changes or new symptoms since last week. Vitals and lab results stable. Plt count 92, per parameters, ok to treat if platelets >30.   Injection tolerated into left abdomen without incident or complaint. Site clean and dry, bandaid applied. Discharged in satisfactory condition with follow up instructions.

## 2020-05-23 MED ORDER — PEGFILGRASTIM-CBQV 6 MG/0.6ML ~~LOC~~ SOSY
PREFILLED_SYRINGE | SUBCUTANEOUS | Status: AC
  Filled 2020-05-23: qty 0.6

## 2020-05-24 ENCOUNTER — Other Ambulatory Visit (HOSPITAL_COMMUNITY): Payer: Self-pay | Admitting: *Deleted

## 2020-05-24 DIAGNOSIS — E119 Type 2 diabetes mellitus without complications: Secondary | ICD-10-CM | POA: Diagnosis not present

## 2020-05-24 DIAGNOSIS — L89150 Pressure ulcer of sacral region, unstageable: Secondary | ICD-10-CM | POA: Diagnosis not present

## 2020-05-24 DIAGNOSIS — F0390 Unspecified dementia without behavioral disturbance: Secondary | ICD-10-CM | POA: Diagnosis not present

## 2020-05-24 DIAGNOSIS — C9000 Multiple myeloma not having achieved remission: Secondary | ICD-10-CM | POA: Diagnosis not present

## 2020-05-24 DIAGNOSIS — R55 Syncope and collapse: Secondary | ICD-10-CM | POA: Diagnosis not present

## 2020-05-24 DIAGNOSIS — I10 Essential (primary) hypertension: Secondary | ICD-10-CM | POA: Diagnosis not present

## 2020-05-24 DIAGNOSIS — E876 Hypokalemia: Secondary | ICD-10-CM | POA: Diagnosis not present

## 2020-05-24 DIAGNOSIS — H409 Unspecified glaucoma: Secondary | ICD-10-CM | POA: Diagnosis not present

## 2020-05-24 DIAGNOSIS — C8580 Other specified types of non-Hodgkin lymphoma, unspecified site: Secondary | ICD-10-CM | POA: Diagnosis not present

## 2020-05-24 MED ORDER — LOSARTAN POTASSIUM 50 MG PO TABS: 50.0000 mg | ORAL_TABLET | Freq: Every day | ORAL | 4 refills | Status: DC

## 2020-05-29 ENCOUNTER — Other Ambulatory Visit: Payer: Self-pay

## 2020-05-29 ENCOUNTER — Inpatient Hospital Stay (HOSPITAL_COMMUNITY): Payer: Medicare PPO

## 2020-05-29 VITALS — BP 91/40 | HR 65 | Temp 97.0°F | Resp 16 | Wt 106.0 lb

## 2020-05-29 DIAGNOSIS — C9000 Multiple myeloma not having achieved remission: Secondary | ICD-10-CM

## 2020-05-29 DIAGNOSIS — N189 Chronic kidney disease, unspecified: Secondary | ICD-10-CM | POA: Diagnosis not present

## 2020-05-29 DIAGNOSIS — D539 Nutritional anemia, unspecified: Secondary | ICD-10-CM | POA: Diagnosis not present

## 2020-05-29 DIAGNOSIS — F0390 Unspecified dementia without behavioral disturbance: Secondary | ICD-10-CM | POA: Diagnosis not present

## 2020-05-29 DIAGNOSIS — Z79899 Other long term (current) drug therapy: Secondary | ICD-10-CM | POA: Diagnosis not present

## 2020-05-29 DIAGNOSIS — Z8572 Personal history of non-Hodgkin lymphomas: Secondary | ICD-10-CM | POA: Diagnosis not present

## 2020-05-29 DIAGNOSIS — I129 Hypertensive chronic kidney disease with stage 1 through stage 4 chronic kidney disease, or unspecified chronic kidney disease: Secondary | ICD-10-CM | POA: Diagnosis not present

## 2020-05-29 DIAGNOSIS — Z5112 Encounter for antineoplastic immunotherapy: Secondary | ICD-10-CM | POA: Diagnosis not present

## 2020-05-29 DIAGNOSIS — Z7952 Long term (current) use of systemic steroids: Secondary | ICD-10-CM | POA: Diagnosis not present

## 2020-05-29 LAB — CBC WITH DIFFERENTIAL/PLATELET
Abs Immature Granulocytes: 0.01 10*3/uL (ref 0.00–0.07)
Basophils Absolute: 0.1 10*3/uL (ref 0.0–0.1)
Basophils Relative: 1 %
Eosinophils Absolute: 0.2 10*3/uL (ref 0.0–0.5)
Eosinophils Relative: 4 %
HCT: 29.5 % — ABNORMAL LOW (ref 36.0–46.0)
Hemoglobin: 9.3 g/dL — ABNORMAL LOW (ref 12.0–15.0)
Immature Granulocytes: 0 %
Lymphocytes Relative: 15 %
Lymphs Abs: 0.8 10*3/uL (ref 0.7–4.0)
MCH: 33.5 pg (ref 26.0–34.0)
MCHC: 31.5 g/dL (ref 30.0–36.0)
MCV: 106.1 fL — ABNORMAL HIGH (ref 80.0–100.0)
Monocytes Absolute: 0.3 10*3/uL (ref 0.1–1.0)
Monocytes Relative: 6 %
Neutro Abs: 4 10*3/uL (ref 1.7–7.7)
Neutrophils Relative %: 74 %
Platelets: 100 10*3/uL — ABNORMAL LOW (ref 150–400)
RBC: 2.78 MIL/uL — ABNORMAL LOW (ref 3.87–5.11)
RDW: 17 % — ABNORMAL HIGH (ref 11.5–15.5)
WBC: 5.5 10*3/uL (ref 4.0–10.5)
nRBC: 0 % (ref 0.0–0.2)

## 2020-05-29 LAB — COMPREHENSIVE METABOLIC PANEL
ALT: 36 U/L (ref 0–44)
AST: 36 U/L (ref 15–41)
Albumin: 3.2 g/dL — ABNORMAL LOW (ref 3.5–5.0)
Alkaline Phosphatase: 60 U/L (ref 38–126)
Anion gap: 9 (ref 5–15)
BUN: 29 mg/dL — ABNORMAL HIGH (ref 8–23)
CO2: 24 mmol/L (ref 22–32)
Calcium: 8.9 mg/dL (ref 8.9–10.3)
Chloride: 107 mmol/L (ref 98–111)
Creatinine, Ser: 1.33 mg/dL — ABNORMAL HIGH (ref 0.44–1.00)
GFR calc Af Amer: 45 mL/min — ABNORMAL LOW (ref >60)
GFR calc non Af Amer: 39 mL/min — ABNORMAL LOW (ref >60)
Glucose, Bld: 111 mg/dL — ABNORMAL HIGH (ref 70–99)
Potassium: 3.9 mmol/L (ref 3.5–5.1)
Sodium: 140 mmol/L (ref 135–145)
Total Bilirubin: 0.6 mg/dL (ref 0.3–1.2)
Total Protein: 6.3 g/dL — ABNORMAL LOW (ref 6.5–8.1)

## 2020-05-29 MED ORDER — BORTEZOMIB CHEMO SQ INJECTION 3.5 MG (2.5MG/ML)
1.3000 mg/m2 | Freq: Once | INTRAMUSCULAR | Status: AC
  Administered 2020-05-29: 2 mg via SUBCUTANEOUS
  Filled 2020-05-29: qty 0.8

## 2020-05-29 MED ORDER — DEXAMETHASONE 4 MG PO TABS
10.0000 mg | ORAL_TABLET | Freq: Once | ORAL | Status: AC
  Administered 2020-05-29: 10 mg via ORAL
  Filled 2020-05-29: qty 3

## 2020-05-29 NOTE — Progress Notes (Signed)
Amanda Wu presents today for [id number] Velcade injection. Lab results and vital signs reviewed. BP on the lower side today, which is not uncommon for pt. She states she feels ok and is not dizzy or light headed. Per parameters, proceed with treatment today.  Injection tolerated without incident or complaint. Discharged in satisfactory condition with follow up instructions.

## 2020-05-29 NOTE — Patient Instructions (Signed)
Cancer Center Discharge Instructions for Patients Receiving Chemotherapy   Beginning January 23rd 2017 lab work for the Cancer Center will be done in the  Main lab at  on 1st floor. If you have a lab appointment with the Cancer Center please come in thru the  Main Entrance and check in at the main information desk   Today you received the following chemotherapy agents Velcade  To help prevent nausea and vomiting after your treatment, we encourage you to take your nausea medication    If you develop nausea and vomiting, or diarrhea that is not controlled by your medication, call the clinic.  The clinic phone number is [phone]. Office hours are Monday-Friday 8:30am-5:00pm.  BELOW ARE SYMPTOMS THAT SHOULD BE REPORTED IMMEDIATELY:  *FEVER GREATER THAN 101.0 F  *CHILLS WITH OR WITHOUT FEVER  NAUSEA AND VOMITING THAT IS NOT CONTROLLED WITH YOUR NAUSEA MEDICATION  *UNUSUAL SHORTNESS OF BREATH  *UNUSUAL BRUISING OR BLEEDING  TENDERNESS IN MOUTH AND THROAT WITH OR WITHOUT PRESENCE OF ULCERS  *URINARY PROBLEMS  *BOWEL PROBLEMS  UNUSUAL RASH Items with * indicate a potential emergency and should be followed up as soon as possible. If you have an emergency after office hours please contact your primary care physician or go to the nearest emergency department.  Please call the clinic during office hours if you have any questions or concerns.   You may also contact the Patient Navigator at [phone] should you have any questions or need assistance in obtaining follow up care.      Resources For Cancer Patients and their Caregivers ? American Cancer Society: Can assist with transportation, wigs, general needs, runs Look Good Feel Better.        1-888-227-6333 ? Cancer Care: Provides financial assistance, online support groups, medication/co-pay assistance.  1-800-813-HOPE (4673) ? [facility] Assists [place] cancer  patients and their families through emotional , educational and financial support.  336-427-4357 ? [place] DSS Where to apply for food stamps, Medicaid and utility assistance. 336-342-1394 ? [facility]: Transportation to medical appointments. 336-347-2287 ? Social Security Administration: May apply for disability if have a Stage IV cancer. 336-342-7796 1-800-772-1213 ? [place] Aging, Disability and Transit Services: Assists with nutrition, care and transit needs. 336-349-2343          

## 2020-05-30 DIAGNOSIS — F0390 Unspecified dementia without behavioral disturbance: Secondary | ICD-10-CM | POA: Diagnosis not present

## 2020-05-30 DIAGNOSIS — I10 Essential (primary) hypertension: Secondary | ICD-10-CM | POA: Diagnosis not present

## 2020-05-30 DIAGNOSIS — C8580 Other specified types of non-Hodgkin lymphoma, unspecified site: Secondary | ICD-10-CM | POA: Diagnosis not present

## 2020-05-30 DIAGNOSIS — E876 Hypokalemia: Secondary | ICD-10-CM | POA: Diagnosis not present

## 2020-05-30 DIAGNOSIS — E119 Type 2 diabetes mellitus without complications: Secondary | ICD-10-CM | POA: Diagnosis not present

## 2020-05-30 DIAGNOSIS — C9000 Multiple myeloma not having achieved remission: Secondary | ICD-10-CM | POA: Diagnosis not present

## 2020-05-30 DIAGNOSIS — R55 Syncope and collapse: Secondary | ICD-10-CM | POA: Diagnosis not present

## 2020-05-30 DIAGNOSIS — H409 Unspecified glaucoma: Secondary | ICD-10-CM | POA: Diagnosis not present

## 2020-05-30 DIAGNOSIS — L89150 Pressure ulcer of sacral region, unstageable: Secondary | ICD-10-CM | POA: Diagnosis not present

## 2020-05-31 DIAGNOSIS — I10 Essential (primary) hypertension: Secondary | ICD-10-CM | POA: Diagnosis not present

## 2020-05-31 DIAGNOSIS — L89150 Pressure ulcer of sacral region, unstageable: Secondary | ICD-10-CM | POA: Diagnosis not present

## 2020-05-31 DIAGNOSIS — E876 Hypokalemia: Secondary | ICD-10-CM | POA: Diagnosis not present

## 2020-05-31 DIAGNOSIS — C9000 Multiple myeloma not having achieved remission: Secondary | ICD-10-CM | POA: Diagnosis not present

## 2020-05-31 DIAGNOSIS — E119 Type 2 diabetes mellitus without complications: Secondary | ICD-10-CM | POA: Diagnosis not present

## 2020-05-31 DIAGNOSIS — R55 Syncope and collapse: Secondary | ICD-10-CM | POA: Diagnosis not present

## 2020-05-31 DIAGNOSIS — C8580 Other specified types of non-Hodgkin lymphoma, unspecified site: Secondary | ICD-10-CM | POA: Diagnosis not present

## 2020-05-31 DIAGNOSIS — H409 Unspecified glaucoma: Secondary | ICD-10-CM | POA: Diagnosis not present

## 2020-05-31 DIAGNOSIS — F0390 Unspecified dementia without behavioral disturbance: Secondary | ICD-10-CM | POA: Diagnosis not present

## 2020-06-05 DIAGNOSIS — H409 Unspecified glaucoma: Secondary | ICD-10-CM | POA: Diagnosis not present

## 2020-06-05 DIAGNOSIS — L89150 Pressure ulcer of sacral region, unstageable: Secondary | ICD-10-CM | POA: Diagnosis not present

## 2020-06-05 DIAGNOSIS — I10 Essential (primary) hypertension: Secondary | ICD-10-CM | POA: Diagnosis not present

## 2020-06-05 DIAGNOSIS — F0390 Unspecified dementia without behavioral disturbance: Secondary | ICD-10-CM | POA: Diagnosis not present

## 2020-06-05 DIAGNOSIS — C9000 Multiple myeloma not having achieved remission: Secondary | ICD-10-CM | POA: Diagnosis not present

## 2020-06-05 DIAGNOSIS — C8580 Other specified types of non-Hodgkin lymphoma, unspecified site: Secondary | ICD-10-CM | POA: Diagnosis not present

## 2020-06-05 DIAGNOSIS — E119 Type 2 diabetes mellitus without complications: Secondary | ICD-10-CM | POA: Diagnosis not present

## 2020-06-05 DIAGNOSIS — R55 Syncope and collapse: Secondary | ICD-10-CM | POA: Diagnosis not present

## 2020-06-05 DIAGNOSIS — E876 Hypokalemia: Secondary | ICD-10-CM | POA: Diagnosis not present

## 2020-06-07 DIAGNOSIS — I10 Essential (primary) hypertension: Secondary | ICD-10-CM | POA: Diagnosis not present

## 2020-06-07 DIAGNOSIS — E876 Hypokalemia: Secondary | ICD-10-CM | POA: Diagnosis not present

## 2020-06-07 DIAGNOSIS — L89150 Pressure ulcer of sacral region, unstageable: Secondary | ICD-10-CM | POA: Diagnosis not present

## 2020-06-07 DIAGNOSIS — H409 Unspecified glaucoma: Secondary | ICD-10-CM | POA: Diagnosis not present

## 2020-06-07 DIAGNOSIS — C9000 Multiple myeloma not having achieved remission: Secondary | ICD-10-CM | POA: Diagnosis not present

## 2020-06-07 DIAGNOSIS — E119 Type 2 diabetes mellitus without complications: Secondary | ICD-10-CM | POA: Diagnosis not present

## 2020-06-07 DIAGNOSIS — F0390 Unspecified dementia without behavioral disturbance: Secondary | ICD-10-CM | POA: Diagnosis not present

## 2020-06-07 DIAGNOSIS — C8580 Other specified types of non-Hodgkin lymphoma, unspecified site: Secondary | ICD-10-CM | POA: Diagnosis not present

## 2020-06-07 DIAGNOSIS — R55 Syncope and collapse: Secondary | ICD-10-CM | POA: Diagnosis not present

## 2020-06-08 ENCOUNTER — Telehealth (HOSPITAL_COMMUNITY): Payer: Self-pay

## 2020-06-08 DIAGNOSIS — E876 Hypokalemia: Secondary | ICD-10-CM | POA: Diagnosis not present

## 2020-06-08 DIAGNOSIS — H409 Unspecified glaucoma: Secondary | ICD-10-CM | POA: Diagnosis not present

## 2020-06-08 DIAGNOSIS — R55 Syncope and collapse: Secondary | ICD-10-CM | POA: Diagnosis not present

## 2020-06-08 DIAGNOSIS — L89150 Pressure ulcer of sacral region, unstageable: Secondary | ICD-10-CM | POA: Diagnosis not present

## 2020-06-08 DIAGNOSIS — C9000 Multiple myeloma not having achieved remission: Secondary | ICD-10-CM | POA: Diagnosis not present

## 2020-06-08 DIAGNOSIS — I10 Essential (primary) hypertension: Secondary | ICD-10-CM | POA: Diagnosis not present

## 2020-06-08 DIAGNOSIS — C8580 Other specified types of non-Hodgkin lymphoma, unspecified site: Secondary | ICD-10-CM | POA: Diagnosis not present

## 2020-06-08 DIAGNOSIS — F0390 Unspecified dementia without behavioral disturbance: Secondary | ICD-10-CM | POA: Diagnosis not present

## 2020-06-08 DIAGNOSIS — E119 Type 2 diabetes mellitus without complications: Secondary | ICD-10-CM | POA: Diagnosis not present

## 2020-06-08 NOTE — Telephone Encounter (Signed)
Nutrition  Phone call for nutrition follow-up.  No answer.  Left message on voicemail with callback number.    Kenzie Flakes B. Zenia Resides, [place], [place] Registered Dietitian [phone] (pager)

## 2020-06-10 ENCOUNTER — Other Ambulatory Visit (HOSPITAL_COMMUNITY): Payer: Self-pay | Admitting: Hematology

## 2020-06-10 DIAGNOSIS — C9000 Multiple myeloma not having achieved remission: Secondary | ICD-10-CM

## 2020-06-12 ENCOUNTER — Encounter (HOSPITAL_COMMUNITY): Payer: Self-pay

## 2020-06-12 ENCOUNTER — Other Ambulatory Visit: Payer: Self-pay

## 2020-06-12 ENCOUNTER — Inpatient Hospital Stay (HOSPITAL_COMMUNITY): Payer: Medicare PPO

## 2020-06-12 ENCOUNTER — Other Ambulatory Visit (HOSPITAL_COMMUNITY): Payer: Self-pay | Admitting: *Deleted

## 2020-06-12 VITALS — BP 103/50 | HR 50 | Temp 98.1°F | Resp 18

## 2020-06-12 DIAGNOSIS — Z5112 Encounter for antineoplastic immunotherapy: Secondary | ICD-10-CM | POA: Diagnosis not present

## 2020-06-12 DIAGNOSIS — Z79899 Other long term (current) drug therapy: Secondary | ICD-10-CM | POA: Diagnosis not present

## 2020-06-12 DIAGNOSIS — F0390 Unspecified dementia without behavioral disturbance: Secondary | ICD-10-CM | POA: Diagnosis not present

## 2020-06-12 DIAGNOSIS — Z8572 Personal history of non-Hodgkin lymphomas: Secondary | ICD-10-CM | POA: Diagnosis not present

## 2020-06-12 DIAGNOSIS — N189 Chronic kidney disease, unspecified: Secondary | ICD-10-CM | POA: Diagnosis not present

## 2020-06-12 DIAGNOSIS — C9000 Multiple myeloma not having achieved remission: Secondary | ICD-10-CM

## 2020-06-12 DIAGNOSIS — D539 Nutritional anemia, unspecified: Secondary | ICD-10-CM | POA: Diagnosis not present

## 2020-06-12 DIAGNOSIS — Z7952 Long term (current) use of systemic steroids: Secondary | ICD-10-CM | POA: Diagnosis not present

## 2020-06-12 DIAGNOSIS — I129 Hypertensive chronic kidney disease with stage 1 through stage 4 chronic kidney disease, or unspecified chronic kidney disease: Secondary | ICD-10-CM | POA: Diagnosis not present

## 2020-06-12 LAB — CBC WITH DIFFERENTIAL/PLATELET
Abs Immature Granulocytes: 0.01 10*3/uL (ref 0.00–0.07)
Basophils Absolute: 0 10*3/uL (ref 0.0–0.1)
Basophils Relative: 1 %
Eosinophils Absolute: 0.2 10*3/uL (ref 0.0–0.5)
Eosinophils Relative: 5 %
HCT: 26.5 % — ABNORMAL LOW (ref 36.0–46.0)
Hemoglobin: 8.5 g/dL — ABNORMAL LOW (ref 12.0–15.0)
Immature Granulocytes: 0 %
Lymphocytes Relative: 18 %
Lymphs Abs: 0.6 10*3/uL — ABNORMAL LOW (ref 0.7–4.0)
MCH: 34.1 pg — ABNORMAL HIGH (ref 26.0–34.0)
MCHC: 32.1 g/dL (ref 30.0–36.0)
MCV: 106.4 fL — ABNORMAL HIGH (ref 80.0–100.0)
Monocytes Absolute: 0.5 10*3/uL (ref 0.1–1.0)
Monocytes Relative: 14 %
Neutro Abs: 2.2 10*3/uL (ref 1.7–7.7)
Neutrophils Relative %: 62 %
Platelets: 99 10*3/uL — ABNORMAL LOW (ref 150–400)
RBC: 2.49 MIL/uL — ABNORMAL LOW (ref 3.87–5.11)
RDW: 16 % — ABNORMAL HIGH (ref 11.5–15.5)
WBC: 3.5 10*3/uL — ABNORMAL LOW (ref 4.0–10.5)
nRBC: 0 % (ref 0.0–0.2)

## 2020-06-12 LAB — COMPREHENSIVE METABOLIC PANEL
ALT: 43 U/L (ref 0–44)
AST: 44 U/L — ABNORMAL HIGH (ref 15–41)
Albumin: 3 g/dL — ABNORMAL LOW (ref 3.5–5.0)
Alkaline Phosphatase: 77 U/L (ref 38–126)
Anion gap: 7 (ref 5–15)
BUN: 22 mg/dL (ref 8–23)
CO2: 24 mmol/L (ref 22–32)
Calcium: 8.9 mg/dL (ref 8.9–10.3)
Chloride: 108 mmol/L (ref 98–111)
Creatinine, Ser: 1.27 mg/dL — ABNORMAL HIGH (ref 0.44–1.00)
GFR calc Af Amer: 48 mL/min — ABNORMAL LOW (ref >60)
GFR calc non Af Amer: 41 mL/min — ABNORMAL LOW (ref >60)
Glucose, Bld: 90 mg/dL (ref 70–99)
Potassium: 4.7 mmol/L (ref 3.5–5.1)
Sodium: 139 mmol/L (ref 135–145)
Total Bilirubin: 0.4 mg/dL (ref 0.3–1.2)
Total Protein: 6.1 g/dL — ABNORMAL LOW (ref 6.5–8.1)

## 2020-06-12 LAB — MAGNESIUM: Magnesium: 2.1 mg/dL (ref 1.7–2.4)

## 2020-06-12 LAB — LACTATE DEHYDROGENASE: LDH: 156 U/L (ref 98–192)

## 2020-06-12 MED ORDER — LENALIDOMIDE 10 MG PO CAPS: ORAL_CAPSULE | ORAL | 0 refills | Status: DC

## 2020-06-12 MED ORDER — BORTEZOMIB CHEMO SQ INJECTION 3.5 MG (2.5MG/ML)
1.3000 mg/m2 | Freq: Once | INTRAMUSCULAR | Status: AC
  Administered 2020-06-12: 2 mg via SUBCUTANEOUS
  Filled 2020-06-12: qty 0.8

## 2020-06-12 MED ORDER — DEXAMETHASONE 4 MG PO TABS
10.0000 mg | ORAL_TABLET | Freq: Once | ORAL | Status: AC
  Administered 2020-06-12: 10 mg via ORAL
  Filled 2020-06-12: qty 3

## 2020-06-12 NOTE — Telephone Encounter (Signed)
Okay to refill per provider 

## 2020-06-12 NOTE — Progress Notes (Signed)
Patient tolerated Velcade injection with no complaints voiced.  Lab work reviewed.  See MAR for details.  Injection site clean and dry with no bruising or swelling noted.  Band aid applied.  VSS.  Patient left by wheelchair with no s/s of distress noted.  

## 2020-06-13 LAB — KAPPA/LAMBDA LIGHT CHAINS
Kappa free light chain: 46.9 mg/L — ABNORMAL HIGH (ref 3.3–19.4)
Kappa, lambda light chain ratio: 1.17 (ref 0.26–1.65)
Lambda free light chains: 40.1 mg/L — ABNORMAL HIGH (ref 5.7–26.3)

## 2020-06-13 LAB — PROTEIN ELECTROPHORESIS, SERUM
A/G Ratio: 1.1 (ref 0.7–1.7)
Albumin ELP: 3 g/dL (ref 2.9–4.4)
Alpha-1-Globulin: 0.3 g/dL (ref 0.0–0.4)
Alpha-2-Globulin: 0.7 g/dL (ref 0.4–1.0)
Beta Globulin: 0.7 g/dL (ref 0.7–1.3)
Gamma Globulin: 1 g/dL (ref 0.4–1.8)
Globulin, Total: 2.7 g/dL (ref 2.2–3.9)
M-Spike, %: 0.4 g/dL — ABNORMAL HIGH
Total Protein ELP: 5.7 g/dL — ABNORMAL LOW (ref 6.0–8.5)

## 2020-06-14 DIAGNOSIS — R55 Syncope and collapse: Secondary | ICD-10-CM | POA: Diagnosis not present

## 2020-06-14 DIAGNOSIS — C9000 Multiple myeloma not having achieved remission: Secondary | ICD-10-CM | POA: Diagnosis not present

## 2020-06-14 DIAGNOSIS — H409 Unspecified glaucoma: Secondary | ICD-10-CM | POA: Diagnosis not present

## 2020-06-14 DIAGNOSIS — L89150 Pressure ulcer of sacral region, unstageable: Secondary | ICD-10-CM | POA: Diagnosis not present

## 2020-06-14 DIAGNOSIS — F0390 Unspecified dementia without behavioral disturbance: Secondary | ICD-10-CM | POA: Diagnosis not present

## 2020-06-14 DIAGNOSIS — C8580 Other specified types of non-Hodgkin lymphoma, unspecified site: Secondary | ICD-10-CM | POA: Diagnosis not present

## 2020-06-14 DIAGNOSIS — E876 Hypokalemia: Secondary | ICD-10-CM | POA: Diagnosis not present

## 2020-06-14 DIAGNOSIS — E119 Type 2 diabetes mellitus without complications: Secondary | ICD-10-CM | POA: Diagnosis not present

## 2020-06-14 DIAGNOSIS — I10 Essential (primary) hypertension: Secondary | ICD-10-CM | POA: Diagnosis not present

## 2020-06-19 ENCOUNTER — Inpatient Hospital Stay (HOSPITAL_COMMUNITY): Payer: Medicare PPO

## 2020-06-19 ENCOUNTER — Other Ambulatory Visit: Payer: Self-pay

## 2020-06-19 ENCOUNTER — Encounter (HOSPITAL_COMMUNITY): Payer: Self-pay

## 2020-06-19 ENCOUNTER — Inpatient Hospital Stay (HOSPITAL_COMMUNITY): Payer: Medicare PPO | Attending: Hematology

## 2020-06-19 VITALS — BP 118/66 | HR 63 | Temp 97.2°F | Resp 18 | Wt 110.0 lb

## 2020-06-19 DIAGNOSIS — I129 Hypertensive chronic kidney disease with stage 1 through stage 4 chronic kidney disease, or unspecified chronic kidney disease: Secondary | ICD-10-CM | POA: Diagnosis not present

## 2020-06-19 DIAGNOSIS — N189 Chronic kidney disease, unspecified: Secondary | ICD-10-CM | POA: Diagnosis not present

## 2020-06-19 DIAGNOSIS — I959 Hypotension, unspecified: Secondary | ICD-10-CM | POA: Diagnosis not present

## 2020-06-19 DIAGNOSIS — Z7952 Long term (current) use of systemic steroids: Secondary | ICD-10-CM | POA: Insufficient documentation

## 2020-06-19 DIAGNOSIS — C9000 Multiple myeloma not having achieved remission: Secondary | ICD-10-CM | POA: Diagnosis not present

## 2020-06-19 DIAGNOSIS — E1136 Type 2 diabetes mellitus with diabetic cataract: Secondary | ICD-10-CM | POA: Diagnosis not present

## 2020-06-19 DIAGNOSIS — D631 Anemia in chronic kidney disease: Secondary | ICD-10-CM | POA: Diagnosis not present

## 2020-06-19 DIAGNOSIS — Z7982 Long term (current) use of aspirin: Secondary | ICD-10-CM | POA: Insufficient documentation

## 2020-06-19 DIAGNOSIS — F0390 Unspecified dementia without behavioral disturbance: Secondary | ICD-10-CM | POA: Insufficient documentation

## 2020-06-19 DIAGNOSIS — R630 Anorexia: Secondary | ICD-10-CM | POA: Insufficient documentation

## 2020-06-19 DIAGNOSIS — Z79899 Other long term (current) drug therapy: Secondary | ICD-10-CM | POA: Diagnosis not present

## 2020-06-19 DIAGNOSIS — R5383 Other fatigue: Secondary | ICD-10-CM | POA: Insufficient documentation

## 2020-06-19 DIAGNOSIS — R197 Diarrhea, unspecified: Secondary | ICD-10-CM | POA: Insufficient documentation

## 2020-06-19 DIAGNOSIS — E1122 Type 2 diabetes mellitus with diabetic chronic kidney disease: Secondary | ICD-10-CM | POA: Insufficient documentation

## 2020-06-19 DIAGNOSIS — Z5111 Encounter for antineoplastic chemotherapy: Secondary | ICD-10-CM | POA: Insufficient documentation

## 2020-06-19 DIAGNOSIS — E039 Hypothyroidism, unspecified: Secondary | ICD-10-CM | POA: Insufficient documentation

## 2020-06-19 LAB — CBC WITH DIFFERENTIAL/PLATELET
Abs Immature Granulocytes: 0.01 10*3/uL (ref 0.00–0.07)
Basophils Absolute: 0 10*3/uL (ref 0.0–0.1)
Basophils Relative: 1 %
Eosinophils Absolute: 0.1 10*3/uL (ref 0.0–0.5)
Eosinophils Relative: 2 %
HCT: 26.9 % — ABNORMAL LOW (ref 36.0–46.0)
Hemoglobin: 8.7 g/dL — ABNORMAL LOW (ref 12.0–15.0)
Immature Granulocytes: 0 %
Lymphocytes Relative: 18 %
Lymphs Abs: 0.5 10*3/uL — ABNORMAL LOW (ref 0.7–4.0)
MCH: 34.5 pg — ABNORMAL HIGH (ref 26.0–34.0)
MCHC: 32.3 g/dL (ref 30.0–36.0)
MCV: 106.7 fL — ABNORMAL HIGH (ref 80.0–100.0)
Monocytes Absolute: 0.4 10*3/uL (ref 0.1–1.0)
Monocytes Relative: 14 %
Neutro Abs: 1.8 10*3/uL (ref 1.7–7.7)
Neutrophils Relative %: 65 %
Platelets: 113 10*3/uL — ABNORMAL LOW (ref 150–400)
RBC: 2.52 MIL/uL — ABNORMAL LOW (ref 3.87–5.11)
RDW: 16.2 % — ABNORMAL HIGH (ref 11.5–15.5)
WBC: 2.8 10*3/uL — ABNORMAL LOW (ref 4.0–10.5)
nRBC: 0 % (ref 0.0–0.2)

## 2020-06-19 LAB — COMPREHENSIVE METABOLIC PANEL
ALT: 43 U/L (ref 0–44)
AST: 50 U/L — ABNORMAL HIGH (ref 15–41)
Albumin: 3.1 g/dL — ABNORMAL LOW (ref 3.5–5.0)
Alkaline Phosphatase: 76 U/L (ref 38–126)
Anion gap: 8 (ref 5–15)
BUN: 22 mg/dL (ref 8–23)
CO2: 26 mmol/L (ref 22–32)
Calcium: 9.1 mg/dL (ref 8.9–10.3)
Chloride: 108 mmol/L (ref 98–111)
Creatinine, Ser: 1.11 mg/dL — ABNORMAL HIGH (ref 0.44–1.00)
GFR calc Af Amer: 56 mL/min — ABNORMAL LOW (ref >60)
GFR calc non Af Amer: 49 mL/min — ABNORMAL LOW (ref >60)
Glucose, Bld: 121 mg/dL — ABNORMAL HIGH (ref 70–99)
Potassium: 4.1 mmol/L (ref 3.5–5.1)
Sodium: 142 mmol/L (ref 135–145)
Total Bilirubin: 0.5 mg/dL (ref 0.3–1.2)
Total Protein: 6.2 g/dL — ABNORMAL LOW (ref 6.5–8.1)

## 2020-06-19 MED ORDER — DEXAMETHASONE 4 MG PO TABS
10.0000 mg | ORAL_TABLET | Freq: Once | ORAL | Status: AC
  Administered 2020-06-19: 10 mg via ORAL

## 2020-06-19 MED ORDER — BORTEZOMIB CHEMO SQ INJECTION 3.5 MG (2.5MG/ML)
1.3000 mg/m2 | Freq: Once | INTRAMUSCULAR | Status: AC
  Administered 2020-06-19: 2 mg via SUBCUTANEOUS
  Filled 2020-06-19: qty 0.8

## 2020-06-19 MED ORDER — DEXAMETHASONE 4 MG PO TABS
ORAL_TABLET | ORAL | Status: AC
  Filled 2020-06-19: qty 3

## 2020-06-19 NOTE — Patient Instructions (Signed)
[facility] Discharge Instructions for Patients Receiving Chemotherapy  Today you received the following chemotherapy agents   To help prevent nausea and vomiting after your treatment, we encourage you to take your nausea medication   If you develop nausea and vomiting that is not controlled by your nausea medication, call the clinic.   BELOW ARE SYMPTOMS THAT SHOULD BE REPORTED IMMEDIATELY:  *FEVER GREATER THAN 100.5 F  *CHILLS WITH OR WITHOUT FEVER  NAUSEA AND VOMITING THAT IS NOT CONTROLLED WITH YOUR NAUSEA MEDICATION  *UNUSUAL SHORTNESS OF BREATH  *UNUSUAL BRUISING OR BLEEDING  TENDERNESS IN MOUTH AND THROAT WITH OR WITHOUT PRESENCE OF ULCERS  *URINARY PROBLEMS  *BOWEL PROBLEMS  UNUSUAL RASH Items with * indicate a potential emergency and should be followed up as soon as possible.  Feel free to call the clinic should you have any questions or concerns. The clinic phone number is [phone].  Please show the CHEMO ALERT CARD at check-in to the Emergency Department and triage nurse.   

## 2020-06-19 NOTE — Progress Notes (Signed)
Patient presents today for treatment. Vital signs stable. Patient has no complaints of any pain or changes since the last treatment.   Velcade given today per MD orders. Tolerated without adverse affects. Vital signs stable. No complaints at this time. Discharged from clinic via wheel chair. F/U with [facility] as scheduled.

## 2020-06-21 DIAGNOSIS — E876 Hypokalemia: Secondary | ICD-10-CM | POA: Diagnosis not present

## 2020-06-21 DIAGNOSIS — H409 Unspecified glaucoma: Secondary | ICD-10-CM | POA: Diagnosis not present

## 2020-06-21 DIAGNOSIS — R55 Syncope and collapse: Secondary | ICD-10-CM | POA: Diagnosis not present

## 2020-06-21 DIAGNOSIS — C8580 Other specified types of non-Hodgkin lymphoma, unspecified site: Secondary | ICD-10-CM | POA: Diagnosis not present

## 2020-06-21 DIAGNOSIS — F0390 Unspecified dementia without behavioral disturbance: Secondary | ICD-10-CM | POA: Diagnosis not present

## 2020-06-21 DIAGNOSIS — I10 Essential (primary) hypertension: Secondary | ICD-10-CM | POA: Diagnosis not present

## 2020-06-21 DIAGNOSIS — L89150 Pressure ulcer of sacral region, unstageable: Secondary | ICD-10-CM | POA: Diagnosis not present

## 2020-06-21 DIAGNOSIS — E119 Type 2 diabetes mellitus without complications: Secondary | ICD-10-CM | POA: Diagnosis not present

## 2020-06-21 DIAGNOSIS — C9000 Multiple myeloma not having achieved remission: Secondary | ICD-10-CM | POA: Diagnosis not present

## 2020-06-26 ENCOUNTER — Inpatient Hospital Stay (HOSPITAL_COMMUNITY): Payer: Medicare PPO

## 2020-06-26 ENCOUNTER — Other Ambulatory Visit: Payer: Self-pay

## 2020-06-26 ENCOUNTER — Encounter (HOSPITAL_COMMUNITY): Payer: Self-pay

## 2020-06-26 VITALS — BP 112/48 | HR 54 | Temp 97.1°F | Resp 18 | Wt 105.4 lb

## 2020-06-26 DIAGNOSIS — C9000 Multiple myeloma not having achieved remission: Secondary | ICD-10-CM | POA: Diagnosis not present

## 2020-06-26 DIAGNOSIS — E039 Hypothyroidism, unspecified: Secondary | ICD-10-CM | POA: Diagnosis not present

## 2020-06-26 DIAGNOSIS — N189 Chronic kidney disease, unspecified: Secondary | ICD-10-CM | POA: Diagnosis not present

## 2020-06-26 DIAGNOSIS — R197 Diarrhea, unspecified: Secondary | ICD-10-CM | POA: Diagnosis not present

## 2020-06-26 DIAGNOSIS — D631 Anemia in chronic kidney disease: Secondary | ICD-10-CM | POA: Diagnosis not present

## 2020-06-26 DIAGNOSIS — R5383 Other fatigue: Secondary | ICD-10-CM | POA: Diagnosis not present

## 2020-06-26 DIAGNOSIS — R630 Anorexia: Secondary | ICD-10-CM | POA: Diagnosis not present

## 2020-06-26 DIAGNOSIS — Z5111 Encounter for antineoplastic chemotherapy: Secondary | ICD-10-CM | POA: Diagnosis not present

## 2020-06-26 DIAGNOSIS — I959 Hypotension, unspecified: Secondary | ICD-10-CM | POA: Diagnosis not present

## 2020-06-26 LAB — COMPREHENSIVE METABOLIC PANEL
ALT: 34 U/L (ref 0–44)
AST: 37 U/L (ref 15–41)
Albumin: 3.2 g/dL — ABNORMAL LOW (ref 3.5–5.0)
Alkaline Phosphatase: 71 U/L (ref 38–126)
Anion gap: 8 (ref 5–15)
BUN: 21 mg/dL (ref 8–23)
CO2: 24 mmol/L (ref 22–32)
Calcium: 8.6 mg/dL — ABNORMAL LOW (ref 8.9–10.3)
Chloride: 107 mmol/L (ref 98–111)
Creatinine, Ser: 1.18 mg/dL — ABNORMAL HIGH (ref 0.44–1.00)
GFR calc Af Amer: 52 mL/min — ABNORMAL LOW (ref >60)
GFR calc non Af Amer: 45 mL/min — ABNORMAL LOW (ref >60)
Glucose, Bld: 93 mg/dL (ref 70–99)
Potassium: 4.3 mmol/L (ref 3.5–5.1)
Sodium: 139 mmol/L (ref 135–145)
Total Bilirubin: 0.7 mg/dL (ref 0.3–1.2)
Total Protein: 6.1 g/dL — ABNORMAL LOW (ref 6.5–8.1)

## 2020-06-26 LAB — CBC WITH DIFFERENTIAL/PLATELET
Abs Immature Granulocytes: 0.02 10*3/uL (ref 0.00–0.07)
Basophils Absolute: 0.1 10*3/uL (ref 0.0–0.1)
Basophils Relative: 1 %
Eosinophils Absolute: 0.1 10*3/uL (ref 0.0–0.5)
Eosinophils Relative: 3 %
HCT: 28.6 % — ABNORMAL LOW (ref 36.0–46.0)
Hemoglobin: 9.5 g/dL — ABNORMAL LOW (ref 12.0–15.0)
Immature Granulocytes: 1 %
Lymphocytes Relative: 17 %
Lymphs Abs: 0.7 10*3/uL (ref 0.7–4.0)
MCH: 34.8 pg — ABNORMAL HIGH (ref 26.0–34.0)
MCHC: 33.2 g/dL (ref 30.0–36.0)
MCV: 104.8 fL — ABNORMAL HIGH (ref 80.0–100.0)
Monocytes Absolute: 0.3 10*3/uL (ref 0.1–1.0)
Monocytes Relative: 8 %
Neutro Abs: 2.8 10*3/uL (ref 1.7–7.7)
Neutrophils Relative %: 70 %
Platelets: 98 10*3/uL — ABNORMAL LOW (ref 150–400)
RBC: 2.73 MIL/uL — ABNORMAL LOW (ref 3.87–5.11)
RDW: 15.9 % — ABNORMAL HIGH (ref 11.5–15.5)
WBC: 3.9 10*3/uL — ABNORMAL LOW (ref 4.0–10.5)
nRBC: 0 % (ref 0.0–0.2)

## 2020-06-26 MED ORDER — BORTEZOMIB CHEMO SQ INJECTION 3.5 MG (2.5MG/ML)
1.3000 mg/m2 | Freq: Once | INTRAMUSCULAR | Status: AC
  Administered 2020-06-26: 2 mg via SUBCUTANEOUS
  Filled 2020-06-26: qty 0.8

## 2020-06-26 MED ORDER — DEXAMETHASONE 4 MG PO TABS
ORAL_TABLET | ORAL | Status: AC
  Filled 2020-06-26: qty 5

## 2020-06-26 MED ORDER — DEXAMETHASONE 4 MG PO TABS
10.0000 mg | ORAL_TABLET | Freq: Once | ORAL | Status: AC
  Administered 2020-06-26: 10 mg via ORAL

## 2020-06-26 MED ORDER — PROCHLORPERAZINE MALEATE 10 MG PO TABS
ORAL_TABLET | ORAL | Status: AC
  Filled 2020-06-26: qty 1

## 2020-06-26 NOTE — Progress Notes (Signed)
Patient here today for treatment. Labs reviewed and meet parameters for treatment today.   Treatment given per orders. Patient tolerated it well without problems. Vitals stable and discharged home from clinic via wheelchair. Follow up as scheduled.

## 2020-06-26 NOTE — Patient Instructions (Signed)

## 2020-06-28 DIAGNOSIS — H409 Unspecified glaucoma: Secondary | ICD-10-CM | POA: Diagnosis not present

## 2020-06-28 DIAGNOSIS — E119 Type 2 diabetes mellitus without complications: Secondary | ICD-10-CM | POA: Diagnosis not present

## 2020-06-28 DIAGNOSIS — C8580 Other specified types of non-Hodgkin lymphoma, unspecified site: Secondary | ICD-10-CM | POA: Diagnosis not present

## 2020-06-28 DIAGNOSIS — I10 Essential (primary) hypertension: Secondary | ICD-10-CM | POA: Diagnosis not present

## 2020-06-28 DIAGNOSIS — E876 Hypokalemia: Secondary | ICD-10-CM | POA: Diagnosis not present

## 2020-06-28 DIAGNOSIS — C9000 Multiple myeloma not having achieved remission: Secondary | ICD-10-CM | POA: Diagnosis not present

## 2020-06-28 DIAGNOSIS — R55 Syncope and collapse: Secondary | ICD-10-CM | POA: Diagnosis not present

## 2020-06-28 DIAGNOSIS — F0390 Unspecified dementia without behavioral disturbance: Secondary | ICD-10-CM | POA: Diagnosis not present

## 2020-06-28 DIAGNOSIS — L89150 Pressure ulcer of sacral region, unstageable: Secondary | ICD-10-CM | POA: Diagnosis not present

## 2020-07-10 ENCOUNTER — Other Ambulatory Visit (HOSPITAL_COMMUNITY): Payer: Self-pay | Admitting: *Deleted

## 2020-07-10 ENCOUNTER — Inpatient Hospital Stay (HOSPITAL_COMMUNITY): Payer: Medicare PPO | Admitting: Hematology

## 2020-07-10 ENCOUNTER — Encounter (HOSPITAL_COMMUNITY): Payer: Self-pay | Admitting: General Practice

## 2020-07-10 ENCOUNTER — Other Ambulatory Visit: Payer: Self-pay

## 2020-07-10 ENCOUNTER — Inpatient Hospital Stay (HOSPITAL_COMMUNITY): Payer: Medicare PPO

## 2020-07-10 VITALS — BP 94/50 | HR 46 | Temp 96.9°F | Resp 18 | Wt 108.4 lb

## 2020-07-10 DIAGNOSIS — C9000 Multiple myeloma not having achieved remission: Secondary | ICD-10-CM

## 2020-07-10 DIAGNOSIS — R5383 Other fatigue: Secondary | ICD-10-CM | POA: Diagnosis not present

## 2020-07-10 DIAGNOSIS — E039 Hypothyroidism, unspecified: Secondary | ICD-10-CM | POA: Diagnosis not present

## 2020-07-10 DIAGNOSIS — D631 Anemia in chronic kidney disease: Secondary | ICD-10-CM | POA: Diagnosis not present

## 2020-07-10 DIAGNOSIS — Z5111 Encounter for antineoplastic chemotherapy: Secondary | ICD-10-CM | POA: Diagnosis not present

## 2020-07-10 DIAGNOSIS — N189 Chronic kidney disease, unspecified: Secondary | ICD-10-CM | POA: Diagnosis not present

## 2020-07-10 DIAGNOSIS — R630 Anorexia: Secondary | ICD-10-CM | POA: Diagnosis not present

## 2020-07-10 DIAGNOSIS — R197 Diarrhea, unspecified: Secondary | ICD-10-CM | POA: Diagnosis not present

## 2020-07-10 DIAGNOSIS — I959 Hypotension, unspecified: Secondary | ICD-10-CM | POA: Diagnosis not present

## 2020-07-10 LAB — COMPREHENSIVE METABOLIC PANEL
ALT: 27 U/L (ref 0–44)
AST: 33 U/L (ref 15–41)
Albumin: 3 g/dL — ABNORMAL LOW (ref 3.5–5.0)
Alkaline Phosphatase: 74 U/L (ref 38–126)
Anion gap: 6 (ref 5–15)
BUN: 19 mg/dL (ref 8–23)
CO2: 22 mmol/L (ref 22–32)
Calcium: 8.8 mg/dL — ABNORMAL LOW (ref 8.9–10.3)
Chloride: 112 mmol/L — ABNORMAL HIGH (ref 98–111)
Creatinine, Ser: 1.15 mg/dL — ABNORMAL HIGH (ref 0.44–1.00)
GFR calc Af Amer: 54 mL/min — ABNORMAL LOW (ref >60)
GFR calc non Af Amer: 47 mL/min — ABNORMAL LOW (ref >60)
Glucose, Bld: 102 mg/dL — ABNORMAL HIGH (ref 70–99)
Potassium: 4.2 mmol/L (ref 3.5–5.1)
Sodium: 140 mmol/L (ref 135–145)
Total Bilirubin: 0.4 mg/dL (ref 0.3–1.2)
Total Protein: 6.2 g/dL — ABNORMAL LOW (ref 6.5–8.1)

## 2020-07-10 LAB — CBC WITH DIFFERENTIAL/PLATELET
Abs Immature Granulocytes: 0.01 10*3/uL (ref 0.00–0.07)
Basophils Absolute: 0 10*3/uL (ref 0.0–0.1)
Basophils Relative: 1 %
Eosinophils Absolute: 0.1 10*3/uL (ref 0.0–0.5)
Eosinophils Relative: 4 %
HCT: 26.6 % — ABNORMAL LOW (ref 36.0–46.0)
Hemoglobin: 8.7 g/dL — ABNORMAL LOW (ref 12.0–15.0)
Immature Granulocytes: 0 %
Lymphocytes Relative: 16 %
Lymphs Abs: 0.5 10*3/uL — ABNORMAL LOW (ref 0.7–4.0)
MCH: 34.8 pg — ABNORMAL HIGH (ref 26.0–34.0)
MCHC: 32.7 g/dL (ref 30.0–36.0)
MCV: 106.4 fL — ABNORMAL HIGH (ref 80.0–100.0)
Monocytes Absolute: 0.5 10*3/uL (ref 0.1–1.0)
Monocytes Relative: 14 %
Neutro Abs: 2.2 10*3/uL (ref 1.7–7.7)
Neutrophils Relative %: 65 %
Platelets: 99 10*3/uL — ABNORMAL LOW (ref 150–400)
RBC: 2.5 MIL/uL — ABNORMAL LOW (ref 3.87–5.11)
RDW: 15 % (ref 11.5–15.5)
WBC: 3.4 10*3/uL — ABNORMAL LOW (ref 4.0–10.5)
nRBC: 0 % (ref 0.0–0.2)

## 2020-07-10 LAB — LACTATE DEHYDROGENASE: LDH: 145 U/L (ref 98–192)

## 2020-07-10 MED ORDER — DEXAMETHASONE 4 MG PO TABS
10.0000 mg | ORAL_TABLET | Freq: Once | ORAL | Status: AC
  Administered 2020-07-10: 10 mg via ORAL
  Filled 2020-07-10: qty 3

## 2020-07-10 MED ORDER — BORTEZOMIB CHEMO SQ INJECTION 3.5 MG (2.5MG/ML)
1.3000 mg/m2 | Freq: Once | INTRAMUSCULAR | Status: AC
  Administered 2020-07-10: 2 mg via SUBCUTANEOUS
  Filled 2020-07-10: qty 0.8

## 2020-07-10 NOTE — Progress Notes (Signed)
Patient was seen today by our clinical social worker.  She has assessed their needs and is trying to get her placement in assisted living facilities.    Home health was contacted and they advised that the orders have expired and they need new orders.  Orders placed for home health RN, Education officer, museum, aide.

## 2020-07-10 NOTE — Progress Notes (Signed)
[facility] CSW Progress Notes  Met w patient, caregiver sister and sister Romie Minus in exam room.  Caregiver sister has expressed increasing difficulty w caring for her two sisters in the home - is now wanting to explore options for out of home placement.  This patient was present for the discussion and stated she did not want to live somewhere else.  Patient seemed surprised to hear caregiver sister express that the burden of providing daily care to her two sisters is becoming increasingly difficult.  Caregiver would like to have more information about various out of home placement options, patient was willing to consider options and would like to be able to see various places.    Spoke w [employer], [employer] [facility] rep - [facility] was providing [facility] health RN, Field seismologist and social work services for approx 4 weeks after referral from [facility].  Services were terminated after 4 weeks after caregiver sister Deneise Lever) stated "we've had enough."  HH SW had completed an FL2 and had secured a placement for this patient and her sister.  At the past minute, the caregiver sister decided that she did not want to have sisters placed out of the home.  HH was in agreement that sisters did need placement; however, family refused.  [place] [phone]) worked on this case.  [facility] has discharged patient and sister but can resume Painter services w appropriate orders and are willing to offer RN, SW and aide services as desired.  CSW has also agreed to find options in [place] and/or [place] for placement options.   CSW will update treatment team.  Edwyna Shell, LCSW Clinical Social Worker Phone:  [phone]

## 2020-07-10 NOTE — Patient Instructions (Signed)
[facility] at [facility] Discharge Instructions  You were seen today by Dr. Katragadda. He went over your recent results. Dr. Katragadda will see you back in for labs and follow up.   Thank you for choosing  Cancer Center at [facility] to provide your oncology and hematology care.  To afford each patient quality time with our provider, please arrive at least 15 minutes before your scheduled appointment time.   If you have a lab appointment with the Cancer Center please come in thru the Main Entrance and check in at the main information desk  You need to re-schedule your appointment should you arrive 10 or more minutes late.  We strive to give you quality time with our providers, and arriving late affects you and other patients whose appointments are after yours.  Also, if you no show three or more times for appointments you may be dismissed from the clinic at the providers discretion.     Again, thank you for choosing [facility].  Our hope is that these requests will decrease the amount of time that you wait before being seen by our physicians.       _____________________________________________________________  Should you have questions after your visit to [facility], please contact our office at [phone] between the hours of 8:00 a.m. and 4:30 p.m.  Voicemails left after 4:00 p.m. will not be returned until the following business day.  For prescription refill requests, have your pharmacy contact our office and allow 72 hours.    Cancer Center Support Programs:   > Cancer Support Group  2nd Tuesday of the month 1pm-2pm, Journey Room    

## 2020-07-10 NOTE — Patient Instructions (Signed)
[facility] Discharge Instructions for Patients Receiving Chemotherapy   Beginning January 23rd 2017 lab work for the Cancer Center will be done in the  Main lab at [facility] on 1st floor. If you have a lab appointment with the Cancer Center please come in thru the  Main Entrance and check in at the main information desk   Today you received the following chemotherapy agents Velcade injection. Follow-up as scheduled  To help prevent nausea and vomiting after your treatment, we encourage you to take your nausea medication   If you develop nausea and vomiting, or diarrhea that is not controlled by your medication, call the clinic.  The clinic phone number is [phone]. Office hours are Monday-Friday 8:30am-5:00pm.  BELOW ARE SYMPTOMS THAT SHOULD BE REPORTED IMMEDIATELY:  *FEVER GREATER THAN 101.0 F  *CHILLS WITH OR WITHOUT FEVER  NAUSEA AND VOMITING THAT IS NOT CONTROLLED WITH YOUR NAUSEA MEDICATION  *UNUSUAL SHORTNESS OF BREATH  *UNUSUAL BRUISING OR BLEEDING  TENDERNESS IN MOUTH AND THROAT WITH OR WITHOUT PRESENCE OF ULCERS  *URINARY PROBLEMS  *BOWEL PROBLEMS  UNUSUAL RASH Items with * indicate a potential emergency and should be followed up as soon as possible. If you have an emergency after office hours please contact your primary care physician or go to the nearest emergency department.  Please call the clinic during office hours if you have any questions or concerns.   You may also contact the Patient Navigator at [phone] should you have any questions or need assistance in obtaining follow up care.      Resources For Cancer Patients and their Caregivers ? American Cancer Society: Can assist with transportation, wigs, general needs, runs Look Good Feel Better.        1-888-227-6333 ? Cancer Care: Provides financial assistance, online support groups, medication/co-pay assistance.  1-800-813-HOPE (4673) ? [facility] Assists [place] cancer patients and their families through emotional , educational and financial support.  336-427-4357 ? [place] DSS Where to apply for food stamps, Medicaid and utility assistance. 336-342-1394 ? [facility]: Transportation to medical appointments. 336-347-2287 ? Social Security Administration: May apply for disability if have a Stage IV cancer. 336-342-7796 1-800-772-1213 ? [place] Aging, Disability and Transit Services: Assists with nutrition, care and transit needs. 336-349-2343         

## 2020-07-10 NOTE — Progress Notes (Signed)
[facility] CSW Progress Notes   Call from Orestes Geiman Marie Trammell, [facility] CSW ([phone]).  She had worked with family in early June to get placement for patient and sister - per PCP and FL2, SNF was recommended level of care.  HH CSW had contacted [facility] and [facility] - neither had beds at the time. Caregiver sister did not allow HH SW to seek placement at any other place in the county or elsewhere.  Wanted to "wait, hold steady at home."  Per HH SW, patient was appropriate for bedbound SNF placement, which would not involve Medicare (which requires rehab potential).  HH SW has been in the home and strongly feels that caregiver sister is unable to continue to provide care long term for patient in the home.  HH SW had also given caregiver a list of other options for in home care help.  HH SW will contact PCP to get FL2 redone as it has already expired.  Per HH SW, it may be difficult to find a place for both sisters to be together, they may need to consider separate placements.  Patient and family need to be willing to allow HH SW to seek placement outside of [facility] and [facility].  HH SW will await orders being received to reinstate services.   Shishir Krantz, LCSW Clinical Social Worker Phone:  [phone] Cell:  [phone] 

## 2020-07-10 NOTE — Progress Notes (Signed)
[place],  [address]   CLINIC:  Medical Oncology/Hematology  PCP:  Monico Blitz, MD [address] / [place] [address] [phone]   REASON FOR VISIT:  Follow-up for Multiple Myeloma  PRIOR THERAPY: None  CURRENT THERAPY: Rev/Velcade/Dex  BRIEF ONCOLOGIC HISTORY:  Oncology History  Multiple myeloma ([place])  07/09/2017 Initial Diagnosis   Multiple myeloma ([place])   06/01/2018 -  Chemotherapy   The patient had dexamethasone (DECADRON) tablet 10 mg, 10 mg (100 % of original dose 10 mg), Oral,  Once, 6 of 6 cycles Dose modification: 10 mg (original dose 10 mg, Cycle 1) Administration: 10 mg (06/01/2018), 10 mg (06/08/2018), 10 mg (06/29/2018), 10 mg (07/06/2018), 10 mg (07/27/2018), 10 mg (08/03/2018), 10 mg (08/10/2018), 10 mg (08/24/2018), 10 mg (08/31/2018), 10 mg (09/07/2018), 10 mg (09/21/2018), 10 mg (09/28/2018), 10 mg (10/05/2018), 10 mg (10/19/2018), 10 mg (10/26/2018), 10 mg (11/02/2018) dexamethasone (DECADRON) tablet 10 mg, 10 mg (100 % of original dose 10 mg), Oral,  Once, 21 of 22 cycles Dose modification: 10 mg (original dose 10 mg, Cycle 7) Administration: 10 mg (11/16/2018), 10 mg (11/23/2018), 10 mg (11/30/2018), 10 mg (12/17/2018), 10 mg (12/24/2018), 10 mg (01/04/2019), 10 mg (01/18/2019), 10 mg (01/25/2019), 10 mg (02/01/2019), 10 mg (02/15/2019), 10 mg (02/22/2019), 10 mg (03/01/2019), 10 mg (03/15/2019), 10 mg (03/23/2019), 10 mg (03/31/2019), 10 mg (04/12/2019), 10 mg (04/19/2019), 10 mg (04/26/2019), 10 mg (05/10/2019), 10 mg (05/17/2019), 10 mg (05/24/2019), 10 mg (06/07/2019), 10 mg (06/14/2019), 10 mg (06/21/2019), 10 mg (07/05/2019), 10 mg (07/12/2019), 10 mg (07/19/2019), 10 mg (08/02/2019), 10 mg (08/09/2019), 10 mg (08/16/2019), 10 mg (08/30/2019), 10 mg (09/06/2019), 10 mg (09/13/2019), 10 mg (09/27/2019), 10 mg (10/04/2019), 10 mg (10/11/2019), 10 mg (10/25/2019), 10 mg (11/08/2019), 10 mg (11/22/2019), 10 mg (11/29/2019), 10 mg (12/06/2019), 10 mg (12/20/2019), 10 mg  (12/27/2019), 10 mg (01/03/2020), 10 mg (01/17/2020), 10 mg (01/24/2020), 10 mg (01/31/2020), 10 mg (02/21/2020), 10 mg (02/28/2020), 10 mg (03/06/2020), 10 mg (03/20/2020), 10 mg (03/27/2020), 10 mg (04/03/2020), 10 mg (04/17/2020), 10 mg (04/24/2020), 10 mg (05/01/2020), 10 mg (05/15/2020), 10 mg (05/22/2020), 10 mg (05/29/2020), 10 mg (06/12/2020), 10 mg (06/19/2020), 10 mg (06/26/2020) bortezomib SQ (VELCADE) chemo injection 1.75 mg, 1 mg/m2 = 1.75 mg (100 % of original dose 1 mg/m2), Subcutaneous,  Once, 27 of 28 cycles Dose modification: 1 mg/m2 (original dose 1 mg/m2, Cycle 1, Reason: Provider Judgment), 1.3 mg/m2 (original dose 1 mg/m2, Cycle 8, Reason: Provider Judgment) Administration: 1.75 mg (06/01/2018), 1.75 mg (06/08/2018), 1.75 mg (06/15/2018), 1.75 mg (06/29/2018), 1.75 mg (07/06/2018), 1.75 mg (07/13/2018), 1.75 mg (07/27/2018), 1.75 mg (08/03/2018), 1.75 mg (08/10/2018), 1.75 mg (08/24/2018), 1.75 mg (08/31/2018), 1.75 mg (09/07/2018), 1.75 mg (09/21/2018), 1.75 mg (09/28/2018), 1.75 mg (10/05/2018), 1.75 mg (10/19/2018), 1.75 mg (10/26/2018), 1.75 mg (11/02/2018), 1.75 mg (11/16/2018), 1.75 mg (11/23/2018), 1.75 mg (11/30/2018), 2.25 mg (12/17/2018), 2.25 mg (12/24/2018), 2.25 mg (01/04/2019), 2.25 mg (01/18/2019), 2.25 mg (01/25/2019), 2.25 mg (02/01/2019), 2.25 mg (02/15/2019), 2.25 mg (02/22/2019), 2.25 mg (03/01/2019), 2.25 mg (03/15/2019), 2.25 mg (03/23/2019), 2.25 mg (03/31/2019), 2.25 mg (04/12/2019), 2.25 mg (04/19/2019), 2.25 mg (04/26/2019), 2.25 mg (05/10/2019), 2.25 mg (05/17/2019), 2.25 mg (05/24/2019), 2.25 mg (06/07/2019), 2.25 mg (06/14/2019), 2.25 mg (06/21/2019), 2.25 mg (07/05/2019), 2.25 mg (07/12/2019), 2.25 mg (07/19/2019), 2.25 mg (08/02/2019), 2.25 mg (08/09/2019), 2.25 mg (08/16/2019), 2.25 mg (08/30/2019), 2.25 mg (09/06/2019), 2.25 mg (09/13/2019), 2.25 mg (09/27/2019), 2.25 mg (10/04/2019), 2.25 mg (10/11/2019), 2.25 mg (10/25/2019), 2.25 mg (11/08/2019), 2.25  mg (11/22/2019), 2.25 mg (11/29/2019), 2.25 mg (12/06/2019), 2.25 mg (12/20/2019), 2.25 mg  (12/27/2019), 2.25 mg (01/03/2020), 2.25 mg (01/17/2020), 2.25 mg (01/24/2020), 2.25 mg (01/31/2020), 2.25 mg (02/21/2020), 2.25 mg (02/28/2020), 2.25 mg (03/06/2020), 2.25 mg (03/20/2020), 2.25 mg (03/27/2020), 2 mg (04/03/2020), 2 mg (04/17/2020), 2 mg (04/24/2020), 2 mg (05/01/2020), 2 mg (05/15/2020), 2 mg (05/22/2020), 2 mg (05/29/2020), 2 mg (06/12/2020), 2 mg (06/19/2020), 2 mg (06/26/2020)  for chemotherapy treatment.      CANCER STAGING: Cancer Staging No matching staging information was found for the patient.  INTERVAL HISTORY:  [name], a 74 y.o. female, returns for routine follow-up of her Multiple Myeloma. Amanda Wu was last seen on 05/15/2020.  Home health has been coming to her home for several weeks now to help her.   She is having significant diarrhea. It was originally thought that maybe it was tied to her dairy intake because she was eating a lot of ice cream, but she stopped and continued to have diarrhea.  This is controlled well with Imodium.  At this time, Amanda Wu's sister is concerned and states that she does need help with her two sister's. She would like to look into putting [name] and her sister Amanda Wu in a nursing home together.    REVIEW OF SYSTEMS:  Review of Systems  Constitutional: Positive for appetite change (moderately decreased) and fatigue (severe).  HENT:  Negative.   Eyes: Negative.   Respiratory: Negative.   Cardiovascular: Negative.   Gastrointestinal: Positive for diarrhea.  Endocrine: Negative.   Genitourinary: Negative.    Musculoskeletal: Negative.   Skin: Negative.   Neurological: Negative.   Hematological: Negative.   Psychiatric/Behavioral: Negative.   All other systems reviewed and are negative.   PAST MEDICAL/SURGICAL HISTORY:  Past Medical History:  Diagnosis Date  . Benign hypertension   . Cataract   . 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 Diabetes mellitus without complication ([place])   . H/O partial nephrectomy   . Hypokalemia   .  Hypothyroidism   . Impaired cognition   . Multiple myeloma ([facility])    Dr Maylon Peppers, [facility]  . Thyroid disease    Past Surgical History:  Procedure Laterality Date  . ABDOMINAL HYSTERECTOMY    . CRANIOTOMY     for lymphoma  . YAG LASER APPLICATION Right 5/36/4680   Procedure: YAG LASER APPLICATION;  Surgeon: [name], MD;  Location: [facility];  Service: Ophthalmology;  Laterality: Right;    SOCIAL HISTORY:  Social History   Socioeconomic History  . Marital status: Single    Spouse name: Not on file  . Number of children: Not on file  . Years of education: Not on file  . Highest education level: Not on file  Occupational History  . Not on file  Tobacco Use  . Smoking status: Never Smoker  . Smokeless tobacco: Never Used  Substance and Sexual Activity  . Alcohol use: No  . Drug use: No  . Sexual activity: Not on file  Other Topics Concern  . Not on file  Social History Narrative  . Not on file   Social Determinants of Health   Financial Resource Strain:   . Difficulty of Paying Living Expenses:   Food Insecurity:   . Worried About Charity fundraiser in the Last Year:   . Arboriculturist in the Last Year:   Transportation Needs:   . Film/video editor (Medical):   Marland Kitchen Lack of Transportation (Non-Medical):  Physical Activity:   . Days of Exercise per Week:   . Minutes of Exercise per Session:   Stress:   . Feeling of Stress :   Social Connections:   . Frequency of Communication with Friends and Family:   . Frequency of Social Gatherings with Friends and Family:   . Attends Religious Services:   . Active Member of Clubs or Organizations:   . Attends Archivist Meetings:   Marland Kitchen Marital Status:   Intimate Partner Violence:   . Fear of Current or Ex-Partner:   . Emotionally Abused:   Marland Kitchen Physically Abused:   . Sexually Abused:     FAMILY HISTORY:  Family History  Problem Relation Age of Onset  . Depression Mother   . Diabetes Mother   . Heart  disease Father   . Cancer - Prostate Brother   . Cancer Brother   . Cancer Sister     CURRENT MEDICATIONS:  Current Outpatient Medications  Medication Sig Dispense Refill  . ACCU-CHEK SMARTVIEW test strip     . Accu-Chek Softclix Lancets lancets     . acetaminophen (TYLENOL) 500 MG tablet Take 1,000 mg by mouth every 6 (six) hours as needed for mild pain.     Marland Kitchen acyclovir (ZOVIRAX) 400 MG tablet TAKE 1 TABLET BY MOUTH TWICE DAILY (Patient taking differently: Take 400 mg by mouth daily. ) 60 tablet 2  . aspirin EC 81 MG tablet Take 81 mg by mouth daily.    . bortezomib IV (VELCADE) 3.5 MG injection Inject into the vein.    . calcium-vitamin D (OSCAL WITH D) 500-200 MG-UNIT tablet Take 1 tablet by mouth daily with breakfast.    . dexamethasone (DECADRON) 4 MG tablet TAKE TWO AND ONE-HALF TABLETS BY MOUTH WEEKLY FOR THREE WEEKS (THREE WEEKS ON, ONE WEEK OFF) IN A ROW AND REPEAT MONTHLY  3  . donepezil (ARICEPT) 10 MG tablet TAKE 1 TABLET BY MOUTH every evening 30 tablet 3  . ferrous sulfate 325 (65 FE) MG tablet Take 325 mg by mouth daily with breakfast.    . lenalidomide (REVLIMID) 10 MG capsule TAKE 1 CAPSULE BY MOUTH  DAILY FOR 14 DAYS ON THEN  14 DAYS OFF 14 capsule 0  . levothyroxine (SYNTHROID, LEVOTHROID) 25 MCG tablet Take 25 mcg by mouth daily before breakfast.    . losartan (COZAAR) 50 MG tablet Take 1 tablet (50 mg total) by mouth daily. 30 tablet 4  . Multiple Vitamins-Minerals (MULTIVITAMIN WOMEN 50+ PO) Take by mouth.    . potassium chloride (KLOR-CON) 10 MEQ tablet Takes two tablets daily 90 tablet 5   No current facility-administered medications for this visit.   Facility-Administered Medications Ordered in Other Visits  Medication Dose Route Frequency Provider Last Rate Last Admin  . prochlorperazine (COMPAZINE) tablet 10 mg  10 mg Oral Once Derek Jack, MD        ALLERGIES:  No Active Allergies  PHYSICAL EXAM:  Performance status (ECOG): 2 - Symptomatic,  <50% confined to bed  Vitals:   07/10/20 1314  BP: (!) 94/50  Pulse: 46  Resp: 18  Temp: (!) 96.9 F (36.1 C)  SpO2: 98%   Wt Readings from Last 3 Encounters:  07/10/20 108 lb 6.4 oz (49.2 kg)  06/26/20 105 lb 6.4 oz (47.8 kg)  06/19/20 110 lb (49.9 kg)   Physical Exam Vitals and nursing note reviewed.  Constitutional:      Appearance: Normal appearance.  HENT:     Mouth/Throat:  Mouth: Mucous membranes are moist.  Eyes:     Pupils: Pupils are equal, round, and reactive to light.  Cardiovascular:     Rate and Rhythm: Normal rate and regular rhythm.     Pulses: Normal pulses.     Heart sounds: Normal heart sounds.  Pulmonary:     Effort: Pulmonary effort is normal.     Breath sounds: Normal breath sounds.  Abdominal:     Palpations: Abdomen is soft. There is no mass.     Tenderness: There is no abdominal tenderness.  Musculoskeletal:     Cervical back: Neck supple.     Right lower leg: No edema.     Left lower leg: No edema.  Neurological:     Mental Status: She is alert and oriented to person, place, and time.  Psychiatric:        Mood and Affect: Mood normal.        Behavior: Behavior normal.      LABORATORY DATA:  I have reviewed the labs as listed.  CBC Latest Ref Rng & Units 07/10/2020 06/26/2020 06/19/2020  WBC 4.0 - 10.5 K/uL 3.4(L) 3.9(L) 2.8(L)  Hemoglobin 12.0 - 15.0 g/dL 8.7(L) 9.5(L) 8.7(L)  Hematocrit 36 - 46 % 26.6(L) 28.6(L) 26.9(L)  Platelets 150 - 400 K/uL 99(L) 98(L) 113(L)   CMP Latest Ref Rng & Units 07/10/2020 06/26/2020 06/19/2020  Glucose 70 - 99 mg/dL 102(H) 93 121(H)  BUN 8 - 23 mg/dL '19 21 22  ' Creatinine 0.44 - 1.00 mg/dL 1.15(H) 1.18(H) 1.11(H)  Sodium 135 - 145 mmol/L 140 139 142  Potassium 3.5 - 5.1 mmol/L 4.2 4.3 4.1  Chloride 98 - 111 mmol/L 112(H) 107 108  CO2 22 - 32 mmol/L '22 24 26  ' Calcium 8.9 - 10.3 mg/dL 8.8(L) 8.6(L) 9.1  Total Protein 6.5 - 8.1 g/dL 6.2(L) 6.1(L) 6.2(L)  Total Bilirubin 0.3 - 1.2 mg/dL 0.4 0.7 0.5   Alkaline Phos 38 - 126 U/L 74 71 76  AST 15 - 41 U/L 33 37 50(H)  ALT 0 - 44 U/L 27 34 43    DIAGNOSTIC IMAGING:  I have independently reviewed the scans and discussed with the patient. No results found.    ASSESSMENT:  1.  IgG lambda multiple myeloma: -She is on Revlimid 10 mg 2 weeks on/2 weeks of along with Velcade 3 weeks on/1 week off.  Dexamethasone 10 mg on days of Velcade. -Myeloma panel on 04/17/2020 shows M spike 0.5 g.  Kappa light chains are 44.  Lambda light chains are 34.  Ratio is 1.3.  2.  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   PLAN:  1.  IgG lambda multiple myeloma: -Reviewed labs from 06/12/2020.  M spike has improved to 0.4 from 0.5 previously. -Free light chain ratio is 1.17 with kappa light chains 46.9 and lambda light chains 40.1. -She will continue current regimen with Velcade, Revlimid. -We will plan to see her back in 6 weeks with repeat labs. -Her older sister is taking care of the patient.  Older sister is getting tired.  We will ask our social work to see if this patient can be transferred to the nursing home or assisted living facility.  2.  CKD: -Creatinine has improved to 1.15.  We have cut back on losartan to 50 mg daily.  3.  Weight loss: -Her weight is stable in the last 6 weeks.  4.  Dementia: -Continue Aricept 10 mg daily.  5.  Shingles prophylaxis: -Continue acyclovir 400 mg twice daily.  6.  Myeloma bone disease: -She was treated with Zometa for several years and was discontinued.  7.  Macrocytic anemia: -Likely from myelosuppression and CKD.  Hemoglobin stable between 8 and 9.  8.  Hypertension: -Blood pressure is 94/50.  Denies any dizziness.   Orders placed this encounter:  No orders of the defined types were placed in this encounter.    Derek Jack, MD [facility] [phone]   I, Jacqualyn Posey, am acting as a scribe for Dr. Sanda Linger.  I,  Derek Jack MD, have reviewed the above documentation for accuracy and completeness, and I agree with the above.

## 2020-07-10 NOTE — Progress Notes (Signed)
1340 Labs reviewed with and pt seen by Dr. Delton Coombes and pt approved for Velcade injection today per MD                                  [facility] tolerated Velcade injection well without complaints or incident. Pt discharged via wheelchair in satisfactory condition accompanied by her sisters

## 2020-07-11 ENCOUNTER — Other Ambulatory Visit (HOSPITAL_COMMUNITY): Payer: Self-pay | Admitting: *Deleted

## 2020-07-11 DIAGNOSIS — C9000 Multiple myeloma not having achieved remission: Secondary | ICD-10-CM

## 2020-07-12 ENCOUNTER — Other Ambulatory Visit (HOSPITAL_COMMUNITY): Payer: Self-pay | Admitting: *Deleted

## 2020-07-12 DIAGNOSIS — C9000 Multiple myeloma not having achieved remission: Secondary | ICD-10-CM

## 2020-07-12 LAB — PROTEIN ELECTROPHORESIS, SERUM

## 2020-07-12 LAB — KAPPA/LAMBDA LIGHT CHAINS

## 2020-07-12 MED ORDER — LENALIDOMIDE 10 MG PO CAPS: ORAL_CAPSULE | ORAL | 0 refills | Status: DC

## 2020-07-12 NOTE — Telephone Encounter (Signed)
Chart reviewed, per last office note ,revlimid refilled.

## 2020-07-13 ENCOUNTER — Telehealth: Payer: Self-pay | Admitting: General Practice

## 2020-07-13 NOTE — Telephone Encounter (Signed)
Cancer Center CSW Progress Notes ° °Call to Tami Morris, admissions at [facility].  She is able to offer beds to patient and her sister, can admit on Monday august 2.  Sisters will not be in same room initially - when possible, facility is aware that sisters might like to be together and will try to facilitate that request.  Per Tami, she has spoken to caregiver sister Annie, Annie is unsure whether she wants to accept bed offer as sisters cannot be in same room.  CSW has also spoken w admissions at [facility] - they are not able to offer a bed to either sister at this time. [facility] admissions will speak w caregiver sister on Monday to determine her wishes.   ° °Merville Hijazi, LCSW °Clinical Social Worker °Phone:  [phone] °Cell:  [phone] °

## 2020-07-17 ENCOUNTER — Telehealth (HOSPITAL_COMMUNITY): Payer: Self-pay | Admitting: General Practice

## 2020-07-17 ENCOUNTER — Inpatient Hospital Stay (HOSPITAL_COMMUNITY): Payer: Medicare PPO

## 2020-07-17 ENCOUNTER — Inpatient Hospital Stay
Admission: RE | Admit: 2020-07-17 | Discharge: 2021-02-05 | Disposition: A | Discharge status: Other Acute Inpatient Hospital | Payer: Medicare PPO | Source: Ambulatory Visit | Attending: Internal Medicine | Admitting: Internal Medicine

## 2020-07-17 ENCOUNTER — Other Ambulatory Visit: Payer: Self-pay

## 2020-07-17 ENCOUNTER — Encounter (HOSPITAL_COMMUNITY): Payer: Self-pay

## 2020-07-17 ENCOUNTER — Inpatient Hospital Stay (HOSPITAL_COMMUNITY): Payer: Medicare PPO | Attending: Hematology

## 2020-07-17 VITALS — BP 123/66 | HR 52 | Temp 97.1°F | Resp 17 | Wt 107.8 lb

## 2020-07-17 DIAGNOSIS — Z5111 Encounter for antineoplastic chemotherapy: Secondary | ICD-10-CM | POA: Diagnosis not present

## 2020-07-17 DIAGNOSIS — C9000 Multiple myeloma not having achieved remission: Secondary | ICD-10-CM

## 2020-07-17 LAB — COMPREHENSIVE METABOLIC PANEL
ALT: 23 U/L (ref 0–44)
AST: 30 U/L (ref 15–41)
Albumin: 3 g/dL — ABNORMAL LOW (ref 3.5–5.0)
Alkaline Phosphatase: 64 U/L (ref 38–126)
Anion gap: 7 (ref 5–15)
BUN: 17 mg/dL (ref 8–23)
CO2: 25 mmol/L (ref 22–32)
Calcium: 8.8 mg/dL — ABNORMAL LOW (ref 8.9–10.3)
Chloride: 108 mmol/L (ref 98–111)
Creatinine, Ser: 1.15 mg/dL — ABNORMAL HIGH (ref 0.44–1.00)
GFR calc Af Amer: 54 mL/min — ABNORMAL LOW (ref >60)
GFR calc non Af Amer: 47 mL/min — ABNORMAL LOW (ref >60)
Glucose, Bld: 103 mg/dL — ABNORMAL HIGH (ref 70–99)
Potassium: 4.1 mmol/L (ref 3.5–5.1)
Sodium: 140 mmol/L (ref 135–145)
Total Bilirubin: 0.6 mg/dL (ref 0.3–1.2)
Total Protein: 6 g/dL — ABNORMAL LOW (ref 6.5–8.1)

## 2020-07-17 LAB — CBC WITH DIFFERENTIAL/PLATELET
Abs Immature Granulocytes: 0.01 10*3/uL (ref 0.00–0.07)
Basophils Absolute: 0.1 10*3/uL (ref 0.0–0.1)
Basophils Relative: 2 %
Eosinophils Absolute: 0.1 10*3/uL (ref 0.0–0.5)
Eosinophils Relative: 3 %
HCT: 27.4 % — ABNORMAL LOW (ref 36.0–46.0)
Hemoglobin: 8.9 g/dL — ABNORMAL LOW (ref 12.0–15.0)
Immature Granulocytes: 0 %
Lymphocytes Relative: 19 %
Lymphs Abs: 0.5 10*3/uL — ABNORMAL LOW (ref 0.7–4.0)
MCH: 34.5 pg — ABNORMAL HIGH (ref 26.0–34.0)
MCHC: 32.5 g/dL (ref 30.0–36.0)
MCV: 106.2 fL — ABNORMAL HIGH (ref 80.0–100.0)
Monocytes Absolute: 0.4 10*3/uL (ref 0.1–1.0)
Monocytes Relative: 16 %
Neutro Abs: 1.6 10*3/uL — ABNORMAL LOW (ref 1.7–7.7)
Neutrophils Relative %: 60 %
Platelets: 108 10*3/uL — ABNORMAL LOW (ref 150–400)
RBC: 2.58 MIL/uL — ABNORMAL LOW (ref 3.87–5.11)
RDW: 15.1 % (ref 11.5–15.5)
WBC: 2.6 10*3/uL — ABNORMAL LOW (ref 4.0–10.5)
nRBC: 0 % (ref 0.0–0.2)

## 2020-07-17 MED ORDER — DEXAMETHASONE 4 MG PO TABS
10.0000 mg | ORAL_TABLET | Freq: Once | ORAL | Status: AC
  Administered 2020-07-17: 10 mg via ORAL
  Filled 2020-07-17: qty 3

## 2020-07-17 MED ORDER — BORTEZOMIB CHEMO SQ INJECTION 3.5 MG (2.5MG/ML)
1.3000 mg/m2 | Freq: Once | INTRAMUSCULAR | Status: AC
  Administered 2020-07-17: 2 mg via SUBCUTANEOUS
  Filled 2020-07-17: qty 0.8

## 2020-07-17 NOTE — Telephone Encounter (Signed)
[facility] CSW Progress Notes ° °Per T Morris, patient and sister will admit to [facility] today. ° °Raju Coppolino, LCSW °Clinical Social Worker °Phone:  [phone] ° ° °

## 2020-07-18 ENCOUNTER — Other Ambulatory Visit (HOSPITAL_COMMUNITY): Payer: Self-pay | Admitting: Hematology

## 2020-07-19 ENCOUNTER — Encounter: Payer: Self-pay | Admitting: Internal Medicine

## 2020-07-19 ENCOUNTER — Non-Acute Institutional Stay (SKILLED_NURSING_FACILITY): Payer: Medicare PPO | Admitting: Internal Medicine

## 2020-07-19 DIAGNOSIS — D539 Nutritional anemia, unspecified: Secondary | ICD-10-CM

## 2020-07-19 DIAGNOSIS — I10 Essential (primary) hypertension: Secondary | ICD-10-CM | POA: Diagnosis not present

## 2020-07-19 DIAGNOSIS — E039 Hypothyroidism, unspecified: Secondary | ICD-10-CM | POA: Diagnosis not present

## 2020-07-19 DIAGNOSIS — R627 Adult failure to thrive: Secondary | ICD-10-CM

## 2020-07-19 NOTE — Assessment & Plan Note (Signed)
8/3 H/H 8.9/27.4 with MCV of 106.2. No B12 level on record. Check B12 and folate levels.

## 2020-07-19 NOTE — Patient Instructions (Signed)
See assessment and plan under each diagnosis in the problem list and acutely for this visit 

## 2020-07-19 NOTE — Assessment & Plan Note (Signed)
BP controlled; no change in antihypertensive medications  

## 2020-07-19 NOTE — Assessment & Plan Note (Addendum)
Last TSH on record was in 2018. Although she is on only low-dose L-thyroxine; TSH update indicated, especially with ophthalmologic physical findings & irregular rhythm.

## 2020-07-19 NOTE — Progress Notes (Signed)
NURSING HOME LOCATION:  [facility] NUMBER: .[id number]/D   CODE STATUS: Full Code  PCP:  Monico Blitz, MD  This is a comprehensive admission note to [facility] performed on this date less than 30 days from date of admission. Included are preadmission medical/surgical history; reconciled medication list; family history; social history and comprehensive review of systems.  Corrections and additions to the records were documented. Comprehensive physical exam was also performed. Additionally a clinical summary was entered for each active diagnosis pertinent to this admission in the Problem List to enhance continuity of care.  HPI: She has been admitted to the SNF as a permanent resident.  Social services had worked with her caregiver sister for placement.  In-home assessment suggested that the sister was no longer able to provide the care required for [facility] & their other sister [name].  That sister insisted on placement at this facility or [facility] SNF.  Admission was delayed due to lack of beds at either facility.  Admission labs were performed 8/3.  Significant abnormalities include a creatinine of 1.15 and GFR of 54 indicating CKD stage III A.  Albumin was 3.0.  She exhibited a macrocytic anemia with H/H 8.9/27.4 with MCV of 106.2.  There is no B12 level in the chart.Last TSH was in 2018. Med list was reviewed; this includes acetaminophen 1000 mg every 6 hours as needed.  She has past medical history of multiple myeloma, neurocognitive deficits, hypothyroidism, history of central nervous system lymphoma, diabetes, and essential hypertension. Procedures and surgeries include abdominal hysterectomy and craniotomy for the CNS lymphoma.  Social history: Nondrinker, never smoked.  Family history: There is a significant cancer history in the family.  Family history is noncontributory due to her advanced age.   Review of systems:  Complete review of systems was negative except  for occasional diarrhea described as watery stool.  Otherwise she stated that she was "enjoying myself, getting a good rest".  She did state that she was "turned around" & initially told me she could not tell me the date.  She then stated "August ?,  2021".  Constitutional: No fever, significant weight change, fatigue  Eyes: No redness, discharge, pain, vision change ENT/mouth: No nasal congestion, purulent discharge, earache, change in hearing, sore throat  Cardiovascular: No chest pain, palpitations, paroxysmal nocturnal dyspnea, claudication, edema  Respiratory: No cough, sputum production, hemoptysis, DOE, significant snoring, apnea Gastrointestinal: No heartburn, dysphagia, abdominal pain, nausea /vomiting, rectal bleeding, melena Genitourinary: No dysuria, hematuria, pyuria, incontinence, nocturia Musculoskeletal: No joint stiffness, joint swelling, weakness, pain Dermatologic: No rash, pruritus, change in appearance of skin Neurologic: No dizziness, headache, syncope, seizures, numbness, tingling Psychiatric: No significant anxiety, depression, insomnia, anorexia Endocrine: No change in hair/skin/nails, excessive thirst, excessive hunger, excessive urination  Hematologic/lymphatic: No significant bruising, lymphadenopathy, abnormal bleeding Allergy/immunology: No itchy/watery eyes, significant sneezing, urticaria, angioedema  Physical exam:  Pertinent or positive findings: The optic globes are prominent suggesting proptosis.  She exhibits an intermittent wide-eyed stare.  She is wearing the upper plate.  She has dense plaque over some of the mandibular teeth.  Heart rate is irregular with suggestion of a flow murmur.  Second heart sound is increased.  Pedal pulses are decreased.  She has interosseous wasting of the hands and limbs are atrophic.  Despite limb atrophy,strength to opposition was good in the lower extremities and only slightly less in the upper extremities.  General appearance:  Adequately nourished; no acute distress, increased work of breathing is present.  Lymphatic: No lymphadenopathy about the head, neck, axilla. Eyes: No conjunctival inflammation or lid edema is present. There is no scleral icterus. Ears:  External ear exam shows no significant lesions or deformities.   Nose:  External nasal examination shows no deformity or inflammation. Nasal mucosa are pink and moist without lesions, exudates Neck:  No thyromegaly, masses, tenderness noted.    Heart:  No gallop, click, rub.  Lungs: Chest clear to auscultation without wheezes, rhonchi, rales, rubs. Abdomen: Bowel sounds are normal.  Abdomen is soft and nontender with no organomegaly, hernias, masses. GU: Deferred  Extremities:  No cyanosis, clubbing, edema. Neurologic exam: Balance, Rhomberg, finger to nose testing could not be completed due to clinical state Skin: Warm & dry w/o tenting. No significant lesions or rash.  See clinical summary under each active problem in the Problem List with associated updated therapeutic plan

## 2020-07-19 NOTE — Assessment & Plan Note (Signed)
Permanent placement in the SNF.

## 2020-07-20 ENCOUNTER — Other Ambulatory Visit (HOSPITAL_COMMUNITY)
Admission: RE | Admit: 2020-07-20 | Discharge: 2020-07-20 | Disposition: A | Discharge status: Home / Self Care | Payer: Medicare PPO | Source: Ambulatory Visit | Attending: Internal Medicine | Admitting: Internal Medicine

## 2020-07-20 DIAGNOSIS — D509 Iron deficiency anemia, unspecified: Secondary | ICD-10-CM | POA: Diagnosis not present

## 2020-07-20 LAB — TSH: TSH: 1.769 u[IU]/mL (ref 0.350–4.500)

## 2020-07-20 LAB — FOLATE: Folate: 23.1 ng/mL (ref >5.9)

## 2020-07-20 LAB — VITAMIN B12: Vitamin B-12: 620 pg/mL (ref 180–914)

## 2020-07-23 ENCOUNTER — Encounter (HOSPITAL_COMMUNITY)
Admission: RE | Admit: 2020-07-23 | Discharge: 2020-07-23 | Disposition: A | Discharge status: Home / Self Care | Payer: Medicare PPO | Source: Skilled Nursing Facility | Attending: Internal Medicine | Admitting: Internal Medicine

## 2020-07-23 DIAGNOSIS — E119 Type 2 diabetes mellitus without complications: Secondary | ICD-10-CM | POA: Insufficient documentation

## 2020-07-23 DIAGNOSIS — E039 Hypothyroidism, unspecified: Secondary | ICD-10-CM | POA: Diagnosis not present

## 2020-07-23 LAB — TSH: TSH: 2.264 u[IU]/mL (ref 0.350–4.500)

## 2020-07-24 ENCOUNTER — Inpatient Hospital Stay (HOSPITAL_COMMUNITY): Payer: Medicare PPO

## 2020-07-24 VITALS — BP 111/46 | HR 61 | Temp 96.9°F | Resp 17 | Wt 104.9 lb

## 2020-07-24 DIAGNOSIS — C9000 Multiple myeloma not having achieved remission: Secondary | ICD-10-CM | POA: Diagnosis not present

## 2020-07-24 DIAGNOSIS — Z5111 Encounter for antineoplastic chemotherapy: Secondary | ICD-10-CM | POA: Diagnosis not present

## 2020-07-24 LAB — CBC WITH DIFFERENTIAL/PLATELET
Abs Immature Granulocytes: 0.01 10*3/uL (ref 0.00–0.07)
Basophils Absolute: 0 10*3/uL (ref 0.0–0.1)
Basophils Relative: 1 %
Eosinophils Absolute: 0.1 10*3/uL (ref 0.0–0.5)
Eosinophils Relative: 2 %
HCT: 29.3 % — ABNORMAL LOW (ref 36.0–46.0)
Hemoglobin: 9.3 g/dL — ABNORMAL LOW (ref 12.0–15.0)
Immature Granulocytes: 0 %
Lymphocytes Relative: 13 %
Lymphs Abs: 0.5 10*3/uL — ABNORMAL LOW (ref 0.7–4.0)
MCH: 33.5 pg (ref 26.0–34.0)
MCHC: 31.7 g/dL (ref 30.0–36.0)
MCV: 105.4 fL — ABNORMAL HIGH (ref 80.0–100.0)
Monocytes Absolute: 0.3 10*3/uL (ref 0.1–1.0)
Monocytes Relative: 7 %
Neutro Abs: 3 10*3/uL (ref 1.7–7.7)
Neutrophils Relative %: 77 %
Platelets: 105 10*3/uL — ABNORMAL LOW (ref 150–400)
RBC: 2.78 MIL/uL — ABNORMAL LOW (ref 3.87–5.11)
RDW: 15.2 % (ref 11.5–15.5)
WBC: 3.9 10*3/uL — ABNORMAL LOW (ref 4.0–10.5)
nRBC: 0 % (ref 0.0–0.2)

## 2020-07-24 LAB — COMPREHENSIVE METABOLIC PANEL
ALT: 25 U/L (ref 0–44)
AST: 35 U/L (ref 15–41)
Albumin: 3.5 g/dL (ref 3.5–5.0)
Alkaline Phosphatase: 58 U/L (ref 38–126)
Anion gap: 8 (ref 5–15)
BUN: 25 mg/dL — ABNORMAL HIGH (ref 8–23)
CO2: 22 mmol/L (ref 22–32)
Calcium: 9.2 mg/dL (ref 8.9–10.3)
Chloride: 112 mmol/L — ABNORMAL HIGH (ref 98–111)
Creatinine, Ser: 1.28 mg/dL — ABNORMAL HIGH (ref 0.44–1.00)
GFR calc Af Amer: 47 mL/min — ABNORMAL LOW (ref >60)
GFR calc non Af Amer: 41 mL/min — ABNORMAL LOW (ref >60)
Glucose, Bld: 106 mg/dL — ABNORMAL HIGH (ref 70–99)
Potassium: 3.6 mmol/L (ref 3.5–5.1)
Sodium: 142 mmol/L (ref 135–145)
Total Bilirubin: 0.6 mg/dL (ref 0.3–1.2)
Total Protein: 6.2 g/dL — ABNORMAL LOW (ref 6.5–8.1)

## 2020-07-24 MED ORDER — DEXAMETHASONE 4 MG PO TABS
ORAL_TABLET | ORAL | Status: AC
  Filled 2020-07-24: qty 3

## 2020-07-24 MED ORDER — BORTEZOMIB CHEMO SQ INJECTION 3.5 MG (2.5MG/ML)
1.3000 mg/m2 | Freq: Once | INTRAMUSCULAR | Status: AC
  Administered 2020-07-24: 2 mg via SUBCUTANEOUS
  Filled 2020-07-24: qty 0.8

## 2020-07-24 MED ORDER — DEXAMETHASONE 4 MG PO TABS
10.0000 mg | ORAL_TABLET | Freq: Once | ORAL | Status: AC
  Administered 2020-07-24: 10 mg via ORAL

## 2020-07-24 NOTE — Progress Notes (Signed)
Labs reviewed and are within normal limits for treatment today.   Treatment given per orders. Patient tolerated it well without problems. Vitals stable and discharged home from clinic via wheelchair. Follow up as scheduled.

## 2020-07-24 NOTE — Patient Instructions (Signed)
[facility] Discharge Instructions for Patients Receiving Chemotherapy  Today you received the following chemotherapy agents   To help prevent nausea and vomiting after your treatment, we encourage you to take your nausea medication   If you develop nausea and vomiting that is not controlled by your nausea medication, call the clinic.   BELOW ARE SYMPTOMS THAT SHOULD BE REPORTED IMMEDIATELY:  *FEVER GREATER THAN 100.5 F  *CHILLS WITH OR WITHOUT FEVER  NAUSEA AND VOMITING THAT IS NOT CONTROLLED WITH YOUR NAUSEA MEDICATION  *UNUSUAL SHORTNESS OF BREATH  *UNUSUAL BRUISING OR BLEEDING  TENDERNESS IN MOUTH AND THROAT WITH OR WITHOUT PRESENCE OF ULCERS  *URINARY PROBLEMS  *BOWEL PROBLEMS  UNUSUAL RASH Items with * indicate a potential emergency and should be followed up as soon as possible.  Feel free to call the clinic should you have any questions or concerns. The clinic phone number is [phone].  Please show the CHEMO ALERT CARD at check-in to the Emergency Department and triage nurse.   

## 2020-07-26 ENCOUNTER — Encounter: Payer: Self-pay | Admitting: Adult Health

## 2020-07-26 ENCOUNTER — Non-Acute Institutional Stay (SKILLED_NURSING_FACILITY): Payer: Medicare PPO | Admitting: Adult Health

## 2020-07-26 DIAGNOSIS — B009 Herpesviral infection, unspecified: Secondary | ICD-10-CM | POA: Diagnosis not present

## 2020-07-26 DIAGNOSIS — S32010D Wedge compression fracture of first lumbar vertebra, subsequent encounter for fracture with routine healing: Secondary | ICD-10-CM | POA: Diagnosis not present

## 2020-07-26 DIAGNOSIS — E119 Type 2 diabetes mellitus without complications: Secondary | ICD-10-CM

## 2020-07-26 DIAGNOSIS — F01518 Vascular dementia, unspecified severity, with other behavioral disturbance: Secondary | ICD-10-CM

## 2020-07-26 DIAGNOSIS — I10 Essential (primary) hypertension: Secondary | ICD-10-CM

## 2020-07-26 DIAGNOSIS — D539 Nutritional anemia, unspecified: Secondary | ICD-10-CM | POA: Diagnosis not present

## 2020-07-26 DIAGNOSIS — E876 Hypokalemia: Secondary | ICD-10-CM

## 2020-07-26 DIAGNOSIS — E039 Hypothyroidism, unspecified: Secondary | ICD-10-CM

## 2020-07-26 DIAGNOSIS — E1159 Type 2 diabetes mellitus with other circulatory complications: Secondary | ICD-10-CM

## 2020-07-26 DIAGNOSIS — F0151 Vascular dementia with behavioral disturbance: Secondary | ICD-10-CM

## 2020-07-26 DIAGNOSIS — C9000 Multiple myeloma not having achieved remission: Secondary | ICD-10-CM | POA: Diagnosis not present

## 2020-07-26 DIAGNOSIS — D696 Thrombocytopenia, unspecified: Secondary | ICD-10-CM | POA: Diagnosis not present

## 2020-07-26 DIAGNOSIS — R627 Adult failure to thrive: Secondary | ICD-10-CM

## 2020-07-26 NOTE — Progress Notes (Signed)
Location:    [facility] [facility] Room Number: 148/W Place of Service:  SNF (31)   CODE STATUS: Full Code  No Known Allergies  Chief Complaint  Patient presents with  . Short Term Rehab (STR)       Hypothyroidism unspecified type:    Hypertension associated with type 2 diabetes mellitus   Diabetes mellitus without complication:  Weekly follow up for the first 30 days post admission.     HPI:  She is a 74 year old long term resident of this facility being seen for the management of her chronic illnesses: Hypothyroidism unspecified type:   Hypertension associated with type 2 diabetes mellitus    Diabetes mellitus without complication. There are no repots of uncontrolled pain; no reports of excessive hunger or thirst; she does tend to wander around in different rooms.   Past Medical History:  Diagnosis Date  . Benign hypertension   . Cataract   . Central nervous system lymphoma ([place])   . Diabetes mellitus without complication ([place])   . H/O partial nephrectomy   . Hypokalemia   . Hypothyroidism   . Impaired cognition   . Multiple myeloma (HCC)    Dr Maylon Peppers, [facility]  . Thyroid disease     Past Surgical History:  Procedure Laterality Date  . ABDOMINAL HYSTERECTOMY    . CRANIOTOMY     for lymphoma  . YAG LASER APPLICATION Right 3/50/0938   Procedure: YAG LASER APPLICATION;  Surgeon: [name], MD;  Location: [facility] ORS;  Service: Ophthalmology;  Laterality: Right;    Social History   Socioeconomic History  . Marital status: Single    Spouse name: Not on file  . Number of children: Not on file  . Years of education: Not on file  . Highest education level: Not on file  Occupational History  . Not on file  Tobacco Use  . Smoking status: Never Smoker  . Smokeless tobacco: Never Used  Substance and Sexual Activity  . Alcohol use: No  . Drug use: No  . Sexual activity: Not on file  Other Topics Concern  . Not on file  Social History Narrative  . Not on  file   Social Determinants of Health   Financial Resource Strain:   . Difficulty of Paying Living Expenses:   Food Insecurity:   . Worried About Charity fundraiser in the Last Year:   . Arboriculturist in the Last Year:   Transportation Needs:   . Film/video editor (Medical):   Marland Kitchen Lack of Transportation (Non-Medical):   Physical Activity:   . Days of Exercise per Week:   . Minutes of Exercise per Session:   Stress:   . Feeling of Stress :   Social Connections:   . Frequency of Communication with Friends and Family:   . Frequency of Social Gatherings with Friends and Family:   . Attends Religious Services:   . Active Member of Clubs or Organizations:   . Attends Archivist Meetings:   Marland Kitchen Marital Status:   Intimate Partner Violence:   . Fear of Current or Ex-Partner:   . Emotionally Abused:   Marland Kitchen Physically Abused:   . Sexually Abused:    Family History  Problem Relation Age of Onset  . Depression Mother   . Diabetes Mother   . Heart disease Father   . Cancer - Prostate Brother   . Cancer Brother   . Cancer Sister  VITAL SIGNS BP 131/81   Pulse 71   Temp 98.1 F (36.7 C) (Oral)   Resp 18   Ht _0  (1.753 m)   Wt 109 lb 12.8 oz (49.8 kg)   BMI 16.21 kg/m   Facility-Administered Encounter Medications as of 07/26/2020  Medication  . prochlorperazine (COMPAZINE) tablet 10 mg   Outpatient Encounter Medications as of 07/26/2020  Medication Sig  . acetaminophen (TYLENOL) 500 MG tablet Take 1,000 mg by mouth every 6 (six) hours as needed for mild pain.   Marland Kitchen acyclovir (ZOVIRAX) 400 MG tablet TAKE 1 TABLET BY MOUTH TWICE DAILY  . aspirin EC 81 MG tablet Take 81 mg by mouth daily.  . calcium-vitamin D (OSCAL WITH D) 500-200 MG-UNIT tablet Take 1 tablet by mouth daily with breakfast.  . donepezil (ARICEPT) 10 MG tablet TAKE 1 TABLET BY MOUTH every evening  . ferrous sulfate 325 (65 FE) MG tablet Take 325 mg by mouth daily with breakfast.  .  lenalidomide (REVLIMID) 10 MG capsule TAKE 1 CAPSULE BY MOUTH  DAILY FOR 14 DAYS ON THEN  14 DAYS OFF  . levothyroxine (SYNTHROID, LEVOTHROID) 25 MCG tablet Take 25 mcg by mouth daily before breakfast.  . losartan (COZAAR) 50 MG tablet Take 1 tablet (50 mg total) by mouth daily.  . Multiple Vitamins-Minerals (MULTIVITAMIN WOMEN 50+ PO) Take by mouth.  . NON FORMULARY Diet: _____ Regular, ___x___ NAS, _______Consistent Carbohydrate, _______NPO _____Other  . NON FORMULARY Wanderguard #1335 to ankle for safety awareness. Check placement and function qshift. Every Shift Day, Evening, Night  . Nutritional Supplements (ENSURE CLEAR PO) Take by mouth 2 (two) times daily. Due to variable meal intake, cognitive impairment and low BMI  . potassium chloride (KLOR-CON) 10 MEQ tablet Takes two tablets daily     SIGNIFICANT DIAGNOSTIC EXAMS  LABS REVIEWED TODAY;   07-20-20: vit B 12: 620  07-23-20: tsh 2.264 07-24-20: wbc 3.9; hgb 9.3; hct 29.3 mcv 105.4 plt 105; glucose 106; bun 25; creat 1.28; k+ 3.6; an++ 142; a 9.2 liver normal albumin 3.5    Review of Systems  Constitutional: Negative for malaise/fatigue.  Respiratory: Negative for cough and shortness of breath.   Cardiovascular: Negative for chest pain, palpitations and leg swelling.  Gastrointestinal: Negative for abdominal pain, constipation and heartburn.  Musculoskeletal: Negative for back pain, joint pain and myalgias.  Skin: Negative.   Neurological: Negative for dizziness.  Psychiatric/Behavioral: The patient is not nervous/anxious.     Physical Exam Constitutional:      General: She is not in acute distress.    Appearance: She is underweight. She is not diaphoretic.  Neck:     Thyroid: No thyromegaly.  Cardiovascular:     Rate and Rhythm: Normal rate. Rhythm irregular.     Heart sounds: Murmur heard.      Comments: 2/6 Pedal pulses faint  Pulmonary:     Effort: Pulmonary effort is normal. No respiratory distress.      Breath sounds: Normal breath sounds.  Abdominal:     General: Bowel sounds are normal. There is no distension.     Palpations: Abdomen is soft.     Tenderness: There is no abdominal tenderness.  Musculoskeletal:        General: Normal range of motion.     Cervical back: Neck supple.     Right lower leg: No edema.     Left lower leg: No edema.     Comments: Has muscular atrophy present   Lymphadenopathy:  Cervical: No cervical adenopathy.  Skin:    General: Skin is warm and dry.  Neurological:     Mental Status: She is alert. Mental status is at baseline.  Psychiatric:        Mood and Affect: Mood normal.        ASSESSMENT/ PLAN:  TODAY  1. Hypothyroidism unspecified type: is stable tsh 2.264 will continue synthroid 25 mcg daily   2. Hypertension associated with type 2 diabetes mellitus is stable b/p 131/81 will continue cozaar 50 mg daily asa 81 mg daily   3. Diabetes mellitus without complication: is stable will monitor is on arb and asa  4. Compression fracture of L1 vertebrae with routine healing subsequent encounter (06/2017): is stable has prn tylenol   5. Thrombocytopenia: is stable plt 105 will monitor   6. Multiple myeloma without achieving remission: is stable is followed by oncology. Will continue revlimid 10 mg on 14 days off 14 days will monitor   7.  Macrocytic anemia: is stable hgb 9.3 vit B 12: 620; will continue iron daily   8. Vascular dementia with behavioral disturbance: is stable weight is 109 pounds will continue aricept 10 mg daily if her weight continues to decline will need to lower this medication  9. Herpes: no recent outbreak: will continue acyclovir 400 mg twice daily   10. Failure to thrive in adult: is without change weight is 109 pounds; albumin is 3.5 will continue supplements as directed  11. Hypokalemia: is stable k+ 3.6 will continue k+ 20 meq daily   Will check hgb a1c lipids; hep c urine micro albumin and dexa scan.     MD  is aware of resident's narcotic use and is in agreement with current plan of care. We will attempt to wean resident as appropriate.  Ok Edwards NP [facility] Adult Medicine  Contact [phone] Monday through Friday 8am- 5pm  After hours call [phone]

## 2020-07-27 DIAGNOSIS — E1159 Type 2 diabetes mellitus with other circulatory complications: Secondary | ICD-10-CM | POA: Insufficient documentation

## 2020-07-27 DIAGNOSIS — B009 Herpesviral infection, unspecified: Secondary | ICD-10-CM | POA: Insufficient documentation

## 2020-07-27 DIAGNOSIS — F01518 Vascular dementia, unspecified severity, with other behavioral disturbance: Secondary | ICD-10-CM | POA: Insufficient documentation

## 2020-07-27 DIAGNOSIS — I152 Hypertension secondary to endocrine disorders: Secondary | ICD-10-CM | POA: Insufficient documentation

## 2020-07-27 DIAGNOSIS — F0151 Vascular dementia with behavioral disturbance: Secondary | ICD-10-CM | POA: Insufficient documentation

## 2020-07-27 DIAGNOSIS — E876 Hypokalemia: Secondary | ICD-10-CM | POA: Insufficient documentation

## 2020-07-30 ENCOUNTER — Encounter (HOSPITAL_COMMUNITY)
Admission: RE | Admit: 2020-07-30 | Discharge: 2020-07-30 | Disposition: A | Discharge status: Home / Self Care | Payer: Medicare PPO | Source: Skilled Nursing Facility | Attending: Adult Health | Admitting: Adult Health

## 2020-07-30 DIAGNOSIS — E039 Hypothyroidism, unspecified: Secondary | ICD-10-CM | POA: Diagnosis not present

## 2020-07-30 DIAGNOSIS — E119 Type 2 diabetes mellitus without complications: Secondary | ICD-10-CM | POA: Diagnosis not present

## 2020-07-30 DIAGNOSIS — I10 Essential (primary) hypertension: Secondary | ICD-10-CM | POA: Diagnosis not present

## 2020-07-30 DIAGNOSIS — R41841 Cognitive communication deficit: Secondary | ICD-10-CM | POA: Diagnosis not present

## 2020-07-30 DIAGNOSIS — Z9181 History of falling: Secondary | ICD-10-CM | POA: Diagnosis not present

## 2020-07-30 DIAGNOSIS — R488 Other symbolic dysfunctions: Secondary | ICD-10-CM | POA: Diagnosis not present

## 2020-07-30 LAB — LIPID PANEL
Cholesterol: 116 mg/dL (ref 0–200)
HDL: 53 mg/dL (ref >40)
LDL Cholesterol: 49 mg/dL (ref 0–99)
Total CHOL/HDL Ratio: 2.2 RATIO
Triglycerides: 71 mg/dL (ref <150)
VLDL: 14 mg/dL (ref 0–40)

## 2020-07-30 LAB — HEMOGLOBIN A1C
Hgb A1c MFr Bld: 5.1 % (ref 4.8–5.6)
Mean Plasma Glucose: 99.67 mg/dL

## 2020-07-30 LAB — HEPATITIS C ANTIBODY: HCV Ab: NONREACTIVE

## 2020-07-31 DIAGNOSIS — Z9181 History of falling: Secondary | ICD-10-CM | POA: Diagnosis not present

## 2020-07-31 DIAGNOSIS — R488 Other symbolic dysfunctions: Secondary | ICD-10-CM | POA: Diagnosis not present

## 2020-07-31 DIAGNOSIS — R41841 Cognitive communication deficit: Secondary | ICD-10-CM | POA: Diagnosis not present

## 2020-07-31 DIAGNOSIS — I10 Essential (primary) hypertension: Secondary | ICD-10-CM | POA: Diagnosis not present

## 2020-08-01 ENCOUNTER — Encounter: Payer: Self-pay | Admitting: Adult Health

## 2020-08-01 ENCOUNTER — Non-Acute Institutional Stay (SKILLED_NURSING_FACILITY): Payer: Medicare PPO | Admitting: Adult Health

## 2020-08-01 DIAGNOSIS — E119 Type 2 diabetes mellitus without complications: Secondary | ICD-10-CM

## 2020-08-01 DIAGNOSIS — E1159 Type 2 diabetes mellitus with other circulatory complications: Secondary | ICD-10-CM | POA: Diagnosis not present

## 2020-08-01 DIAGNOSIS — E039 Hypothyroidism, unspecified: Secondary | ICD-10-CM | POA: Diagnosis not present

## 2020-08-01 DIAGNOSIS — I10 Essential (primary) hypertension: Secondary | ICD-10-CM | POA: Diagnosis not present

## 2020-08-01 NOTE — Progress Notes (Signed)
Location:    [place] Room Number: 148/W Place of Service:  SNF (31)   CODE STATUS: Full Code  No Known Allergies  Chief Complaint  Patient presents with  . Short Term Rehab (STR)          Hypothyroidism unspecified type:    Hypertension associated with type 2 diabetes mellitus   Diabetes mellitus without complication:    Weekly follow up for the first 30 days post hospitalization     HPI:  She is a 74 year old long term resident of this facility being seen for the management of her chronic illnesses:Hypothyroidism unspecified type:   Hypertension associated with type 2 diabetes mellitus:  Diabetes mellitus without complication:. She denies any pain. No changes in appetite. No reports of constipation or heart burn.    Past Medical History:  Diagnosis Date  . Benign hypertension   . Cataract   . Central nervous system lymphoma ([place])   . Diabetes mellitus without complication ([place])   . H/O partial nephrectomy   . Hypokalemia   . Hypothyroidism   . Impaired cognition   . Multiple myeloma (HCC)    Dr Maylon Peppers, [facility]  . Thyroid disease     Past Surgical History:  Procedure Laterality Date  . ABDOMINAL HYSTERECTOMY    . CRANIOTOMY     for lymphoma  . YAG LASER APPLICATION Right 03/07/4009   Procedure: YAG LASER APPLICATION;  Surgeon: [name], MD;  Location: [facility] ORS;  Service: Ophthalmology;  Laterality: Right;    Social History   Socioeconomic History  . Marital status: Single    Spouse name: Not on file  . Number of children: Not on file  . Years of education: Not on file  . Highest education level: Not on file  Occupational History  . Not on file  Tobacco Use  . Smoking status: Never Smoker  . Smokeless tobacco: Never Used  Substance and Sexual Activity  . Alcohol use: No  . Drug use: No  . Sexual activity: Not on file  Other Topics Concern  . Not on file  Social History Narrative  . Not on file   Social Determinants of  Health   Financial Resource Strain:   . Difficulty of Paying Living Expenses:   Food Insecurity:   . Worried About Charity fundraiser in the Last Year:   . Arboriculturist in the Last Year:   Transportation Needs:   . Film/video editor (Medical):   Marland Kitchen Lack of Transportation (Non-Medical):   Physical Activity:   . Days of Exercise per Week:   . Minutes of Exercise per Session:   Stress:   . Feeling of Stress :   Social Connections:   . Frequency of Communication with Friends and Family:   . Frequency of Social Gatherings with Friends and Family:   . Attends Religious Services:   . Active Member of Clubs or Organizations:   . Attends Archivist Meetings:   Marland Kitchen Marital Status:   Intimate Partner Violence:   . Fear of Current or Ex-Partner:   . Emotionally Abused:   Marland Kitchen Physically Abused:   . Sexually Abused:    Family History  Problem Relation Age of Onset  . Depression Mother   . Diabetes Mother   . Heart disease Father   . Cancer - Prostate Brother   . Cancer Brother   . Cancer Sister       VITAL SIGNS  BP 130/76   Pulse 78   Temp 97.9 F (36.6 C) (Oral)   Resp 20   Ht _0  (1.753 m)   Wt 105 lb 12.8 oz (48 kg)   SpO2 97%   BMI 15.62 kg/m   Facility-Administered Encounter Medications as of 08/01/2020  Medication  . prochlorperazine (COMPAZINE) tablet 10 mg   Outpatient Encounter Medications as of 08/01/2020  Medication Sig  . acetaminophen (TYLENOL) 500 MG tablet Take 1,000 mg by mouth every 6 (six) hours as needed for mild pain.   Marland Kitchen acyclovir (ZOVIRAX) 400 MG tablet TAKE 1 TABLET BY MOUTH TWICE DAILY  . aspirin EC 81 MG tablet Take 81 mg by mouth daily.  . calcium-vitamin D (OSCAL WITH D) 500-200 MG-UNIT tablet Take 1 tablet by mouth daily with breakfast.  . donepezil (ARICEPT) 10 MG tablet TAKE 1 TABLET BY MOUTH every evening  . ferrous sulfate 325 (65 FE) MG tablet Take 325 mg by mouth daily with breakfast.  . lenalidomide (REVLIMID) 10 MG  capsule TAKE 1 CAPSULE BY MOUTH  DAILY FOR 14 DAYS ON THEN  14 DAYS OFF  . levothyroxine (SYNTHROID, LEVOTHROID) 25 MCG tablet Take 25 mcg by mouth daily before breakfast.  . losartan (COZAAR) 50 MG tablet Take 1 tablet (50 mg total) by mouth daily.  . Multiple Vitamins-Minerals (MULTIVITAMIN WOMEN 50+ PO) Take by mouth.  . NON FORMULARY Diet: _____ Regular, ___x___ NAS, _______Consistent Carbohydrate, _______NPO _____Other  . NON FORMULARY Wanderguard #1335 to ankle for safety awareness. Check placement and function qshift. Every Shift Day, Evening, Night  . Nutritional Supplements (ENSURE CLEAR PO) Take by mouth 2 (two) times daily. Due to variable meal intake, cognitive impairment and low BMI  . potassium chloride (KLOR-CON) 10 MEQ tablet Takes two tablets daily     SIGNIFICANT DIAGNOSTIC EXAMS   LABS REVIEWED PREVIOUS    07-20-20: vit B 12: 620  07-23-20: tsh 2.264 07-24-20: wbc 3.9; hgb 9.3; hct 29.3 mcv 105.4 plt 105; glucose 106; bun 25; creat 1.28; k+ 3.6; na++ 142; a 9.2 liver normal albumin 3.5   TODAY  07-30-20: chol 116; ldl 49; trig 71; hdl 53; hgb a1c 5.1; hep C neg    Review of Systems  Constitutional: Negative for malaise/fatigue.  Respiratory: Negative for cough and shortness of breath.   Cardiovascular: Negative for chest pain, palpitations and leg swelling.  Gastrointestinal: Negative for abdominal pain, constipation and heartburn.  Musculoskeletal: Negative for back pain, joint pain and myalgias.  Skin: Negative.   Neurological: Negative for dizziness.  Psychiatric/Behavioral: The patient is not nervous/anxious.       Physical Exam Constitutional:      General: She is not in acute distress.    Appearance: She is well-developed. She is not diaphoretic.  Neck:     Thyroid: No thyromegaly.  Cardiovascular:     Rate and Rhythm: Normal rate. Rhythm irregular.     Heart sounds: Murmur heard.      Comments: 2/6 pedal pulses faint  Pulmonary:     Effort:  Pulmonary effort is normal. No respiratory distress.     Breath sounds: Normal breath sounds.  Abdominal:     General: Bowel sounds are normal. There is no distension.     Palpations: Abdomen is soft.     Tenderness: There is no abdominal tenderness.  Musculoskeletal:        General: Normal range of motion.     Right lower leg: No edema.     Left lower  leg: No edema.     Comments: Has muscular atrophy present    Lymphadenopathy:     Cervical: No cervical adenopathy.  Skin:    General: Skin is warm and dry.  Neurological:     Mental Status: She is alert. Mental status is at baseline.  Psychiatric:        Mood and Affect: Mood normal.     ASSESSMENT/ PLAN:  TODAY  1. Hypothyroidism unspecified type: is stable tsh 2.264 will continue synthroid 25 mcg daily   2. Hypertension associated with type 2 diabetes mellitus: is stable b/p 130/76 will continue cozaar 50 mg daily and asa 81 mg daily   3. Diabetes mellitus without complication: is stable hgb a1c 5.1 is on arb asa    PREVIOUS   4. Compression fracture of L1 vertebrae with routine healing subsequent encounter (06/2017): is stable has prn tylenol   5. Thrombocytopenia: is stable plt 105 will monitor   6. Multiple myeloma without achieving remission: is stable is followed by oncology. Will continue revlimid 10 mg on 14 days off 14 days will monitor   7.  Macrocytic anemia: is stable hgb 9.3 vit B 12: 620; will continue iron daily   8. Vascular dementia with behavioral disturbance: is stable weight is 109 pounds will continue aricept 10 mg daily if her weight continues to decline will need to lower this medication  9. Herpes: no recent outbreak: will continue acyclovir 400 mg twice daily   10. Failure to thrive in adult: is without change weight is 109 pounds; albumin is 3.5 will continue supplements as directed  11. Hypokalemia: is stable k+ 3.6 will continue k+ 20 meq daily          MD is aware of resident's  narcotic use and is in agreement with current plan of care. We will attempt to wean resident as appropriate.  Ok Edwards NP [facility] Adult Medicine  Contact [phone] Monday through Friday 8am- 5pm  After hours call [phone]

## 2020-08-02 ENCOUNTER — Non-Acute Institutional Stay (SKILLED_NURSING_FACILITY): Payer: Medicare PPO | Admitting: Adult Health

## 2020-08-02 ENCOUNTER — Encounter: Payer: Self-pay | Admitting: Adult Health

## 2020-08-02 DIAGNOSIS — R627 Adult failure to thrive: Secondary | ICD-10-CM | POA: Diagnosis not present

## 2020-08-02 DIAGNOSIS — F01518 Vascular dementia, unspecified severity, with other behavioral disturbance: Secondary | ICD-10-CM

## 2020-08-02 DIAGNOSIS — C9000 Multiple myeloma not having achieved remission: Secondary | ICD-10-CM | POA: Diagnosis not present

## 2020-08-02 DIAGNOSIS — F0151 Vascular dementia with behavioral disturbance: Secondary | ICD-10-CM | POA: Diagnosis not present

## 2020-08-02 NOTE — Progress Notes (Signed)
Location:    [place] Room Number: 148/W Place of Service:  SNF (31)   CODE STATUS: Full Code  No Known Allergies  Chief Complaint  Patient presents with  . Acute Visit    Care Plan Meeting    HPI:  We have come together for her care plan meeting. Family present BIMS 11/15 mood 6/30 BCAT 19/50. There have been no falls. Her weight has been stable; her appetite is fair.  She requires supervision with her adls is frequently incontinent of bladder and bowel. She has a wander guard has had less roaming since moving in with her sister. There are no reports of pain; no reports of constipation; no reports of agitation. She continues to be followed for her chronic illnesses including: Adult failure to thrive Multiple myeloma not having achieved remission vascular dementia with behavioral disturbance  Past Medical History:  Diagnosis Date  . Benign hypertension   . Cataract   . 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 Diabetes mellitus without complication ([place])   . H/O partial nephrectomy   . Hypokalemia   . Hypothyroidism   . Impaired cognition   . Multiple myeloma (HCC)    Dr Maylon Peppers, [facility]  . Thyroid disease     Past Surgical History:  Procedure Laterality Date  . ABDOMINAL HYSTERECTOMY    . CRANIOTOMY     for lymphoma  . YAG LASER APPLICATION Right 04/02/6221   Procedure: YAG LASER APPLICATION;  Surgeon: [name], MD;  Location: [facility];  Service: Ophthalmology;  Laterality: Right;    Social History   Socioeconomic History  . Marital status: Single    Spouse name: Not on file  . Number of children: Not on file  . Years of education: Not on file  . Highest education level: Not on file  Occupational History  . Not on file  Tobacco Use  . Smoking status: Never Smoker  . Smokeless tobacco: Never Used  Substance and Sexual Activity  . Alcohol use: No  . Drug use: No  . Sexual activity: Not on file  Other Topics Concern  . Not on file   Social History Narrative  . Not on file   Social Determinants of Health   Financial Resource Strain:   . Difficulty of Paying Living Expenses: Not on file  Food Insecurity:   . Worried About Charity fundraiser in the Last Year: Not on file  . Ran Out of Food in the Last Year: Not on file  Transportation Needs:   . Lack of Transportation (Medical): Not on file  . Lack of Transportation (Non-Medical): Not on file  Physical Activity:   . Days of Exercise per Week: Not on file  . Minutes of Exercise per Session: Not on file  Stress:   . Feeling of Stress : Not on file  Social Connections:   . Frequency of Communication with Friends and Family: Not on file  . Frequency of Social Gatherings with Friends and Family: Not on file  . Attends Religious Services: Not on file  . Active Member of Clubs or Organizations: Not on file  . Attends Archivist Meetings: Not on file  . Marital Status: Not on file  Intimate Partner Violence:   . Fear of Current or Ex-Partner: Not on file  . Emotionally Abused: Not on file  . Physically Abused: Not on file  . Sexually Abused: Not on file   Family History  Problem Relation  Age of Onset  . Depression Mother   . Diabetes Mother   . Heart disease Father   . Cancer - Prostate Brother   . Cancer Brother   . Cancer Sister       VITAL SIGNS BP 136/71   Pulse 71   Temp 98.2 F (36.8 C) (Oral)   Resp 20   Ht '5\' 9"'  (1.753 m)   Wt 105 lb 12.8 oz (48 kg)   SpO2 97%   BMI 15.62 kg/m   Facility-Administered Encounter Medications as of 08/02/2020  Medication  . prochlorperazine (COMPAZINE) tablet 10 mg   Outpatient Encounter Medications as of 08/02/2020  Medication Sig  . acetaminophen (TYLENOL) 500 MG tablet Take 1,000 mg by mouth every 6 (six) hours as needed for mild pain.   Marland Kitchen acyclovir (ZOVIRAX) 400 MG tablet TAKE 1 TABLET BY MOUTH TWICE DAILY  . aspirin EC 81 MG tablet Take 81 mg by mouth daily.  . calcium-vitamin D (OSCAL  WITH D) 500-200 MG-UNIT tablet Take 1 tablet by mouth daily with breakfast.  . donepezil (ARICEPT) 10 MG tablet TAKE 1 TABLET BY MOUTH every evening  . ferrous sulfate 325 (65 FE) MG tablet Take 325 mg by mouth daily with breakfast.  . lenalidomide (REVLIMID) 10 MG capsule TAKE 1 CAPSULE BY MOUTH  DAILY FOR 14 DAYS ON THEN  14 DAYS OFF  . levothyroxine (SYNTHROID, LEVOTHROID) 25 MCG tablet Take 25 mcg by mouth daily before breakfast.  . losartan (COZAAR) 50 MG tablet Take 1 tablet (50 mg total) by mouth daily.  . Multiple Vitamins-Minerals (MULTIVITAMIN WOMEN 50+ PO) Take by mouth.  . NON FORMULARY Diet: _____ Regular, ___x___ NAS, _______Consistent Carbohydrate, _______NPO _____Other  . NON FORMULARY Wanderguard #1335 to ankle for safety awareness. Check placement and function qshift. Every Shift Day, Evening, Night  . Nutritional Supplements (ENSURE CLEAR PO) Take by mouth 2 (two) times daily. Due to variable meal intake, cognitive impairment and low BMI  . potassium chloride (KLOR-CON) 10 MEQ tablet Takes two tablets daily     SIGNIFICANT DIAGNOSTIC EXAMS  LABS REVIEWED PREVIOUS    07-20-20: vit B 12: 620  07-23-20: tsh 2.264 07-24-20: wbc 3.9; hgb 9.3; hct 29.3 mcv 105.4 plt 105; glucose 106; bun 25; creat 1.28; k+ 3.6; na++ 142; a 9.2 liver normal albumin 3.5  07-30-20: chol 116; ldl 49; trig 71; hdl 53; hgb a1c 5.1; hep C neg   NO NEW LABS.   Review of Systems  Constitutional: Negative for malaise/fatigue.  Respiratory: Negative for cough and shortness of breath.   Cardiovascular: Negative for chest pain, palpitations and leg swelling.  Gastrointestinal: Negative for abdominal pain, constipation and heartburn.  Musculoskeletal: Negative for back pain, joint pain and myalgias.  Skin: Negative.   Neurological: Negative for dizziness.  Psychiatric/Behavioral: The patient is not nervous/anxious.     Physical Exam Constitutional:      General: She is not in acute distress.     Appearance: She is well-developed. She is not diaphoretic.  Neck:     Thyroid: No thyromegaly.  Cardiovascular:     Rate and Rhythm: Normal rate. Rhythm irregular.     Heart sounds: Murmur heard.      Comments: 2/6 pedal pulses faint Pulmonary:     Effort: Pulmonary effort is normal. No respiratory distress.     Breath sounds: Normal breath sounds.  Abdominal:     General: Bowel sounds are normal. There is no distension.     Palpations: Abdomen is  soft.     Tenderness: There is no abdominal tenderness.  Musculoskeletal:        General: Normal range of motion.     Right lower leg: No edema.     Left lower leg: No edema.  Lymphadenopathy:     Cervical: No cervical adenopathy.  Skin:    General: Skin is warm and dry.  Neurological:     Mental Status: She is alert. Mental status is at baseline.  Psychiatric:        Mood and Affect: Mood normal.        ASSESSMENT/ PLAN:  TODAY  1. Adult failure to thrive 2. Multiple myeloma not having achieved remission 3. vascular dementia with behavioral disturbance  Will continue current medications Will continue current plna of care Will continue to monitor her status    MD is aware of resident's narcotic use and is in agreement with current plan of care. We will attempt to wean resident as appropriate.  Ok Edwards NP [facility] Adult [facility]  Contact [phone] Monday through Friday 8am- 5pm  After hours call [phone]

## 2020-08-03 DIAGNOSIS — R41841 Cognitive communication deficit: Secondary | ICD-10-CM | POA: Diagnosis not present

## 2020-08-03 DIAGNOSIS — Z9181 History of falling: Secondary | ICD-10-CM | POA: Diagnosis not present

## 2020-08-03 DIAGNOSIS — R488 Other symbolic dysfunctions: Secondary | ICD-10-CM | POA: Diagnosis not present

## 2020-08-03 DIAGNOSIS — Z1159 Encounter for screening for other viral diseases: Secondary | ICD-10-CM | POA: Diagnosis not present

## 2020-08-03 DIAGNOSIS — I10 Essential (primary) hypertension: Secondary | ICD-10-CM | POA: Diagnosis not present

## 2020-08-03 DIAGNOSIS — C8590 Non-Hodgkin lymphoma, unspecified, unspecified site: Secondary | ICD-10-CM | POA: Diagnosis not present

## 2020-08-06 DIAGNOSIS — R488 Other symbolic dysfunctions: Secondary | ICD-10-CM | POA: Diagnosis not present

## 2020-08-06 DIAGNOSIS — C8590 Non-Hodgkin lymphoma, unspecified, unspecified site: Secondary | ICD-10-CM | POA: Diagnosis not present

## 2020-08-06 DIAGNOSIS — I10 Essential (primary) hypertension: Secondary | ICD-10-CM | POA: Diagnosis not present

## 2020-08-06 DIAGNOSIS — Z9181 History of falling: Secondary | ICD-10-CM | POA: Diagnosis not present

## 2020-08-06 DIAGNOSIS — R41841 Cognitive communication deficit: Secondary | ICD-10-CM | POA: Diagnosis not present

## 2020-08-06 DIAGNOSIS — Z1159 Encounter for screening for other viral diseases: Secondary | ICD-10-CM | POA: Diagnosis not present

## 2020-08-07 ENCOUNTER — Inpatient Hospital Stay (HOSPITAL_COMMUNITY): Payer: Medicare PPO

## 2020-08-07 ENCOUNTER — Ambulatory Visit (HOSPITAL_COMMUNITY): Payer: Medicare PPO | Admitting: Hematology

## 2020-08-07 ENCOUNTER — Other Ambulatory Visit (HOSPITAL_COMMUNITY): Payer: Medicare PPO

## 2020-08-07 VITALS — BP 112/51 | HR 62 | Temp 96.8°F | Resp 16 | Wt 103.1 lb

## 2020-08-07 DIAGNOSIS — C9000 Multiple myeloma not having achieved remission: Secondary | ICD-10-CM | POA: Diagnosis not present

## 2020-08-07 DIAGNOSIS — Z9181 History of falling: Secondary | ICD-10-CM | POA: Diagnosis not present

## 2020-08-07 DIAGNOSIS — R41841 Cognitive communication deficit: Secondary | ICD-10-CM | POA: Diagnosis not present

## 2020-08-07 DIAGNOSIS — R488 Other symbolic dysfunctions: Secondary | ICD-10-CM | POA: Diagnosis not present

## 2020-08-07 DIAGNOSIS — I10 Essential (primary) hypertension: Secondary | ICD-10-CM | POA: Diagnosis not present

## 2020-08-07 DIAGNOSIS — Z5111 Encounter for antineoplastic chemotherapy: Secondary | ICD-10-CM | POA: Diagnosis not present

## 2020-08-07 LAB — COMPREHENSIVE METABOLIC PANEL
ALT: 27 U/L (ref 0–44)
AST: 33 U/L (ref 15–41)
Albumin: 3.2 g/dL — ABNORMAL LOW (ref 3.5–5.0)
Alkaline Phosphatase: 59 U/L (ref 38–126)
Anion gap: 7 (ref 5–15)
BUN: 27 mg/dL — ABNORMAL HIGH (ref 8–23)
CO2: 22 mmol/L (ref 22–32)
Calcium: 9 mg/dL (ref 8.9–10.3)
Chloride: 114 mmol/L — ABNORMAL HIGH (ref 98–111)
Creatinine, Ser: 1.22 mg/dL — ABNORMAL HIGH (ref 0.44–1.00)
GFR calc Af Amer: 50 mL/min — ABNORMAL LOW (ref >60)
GFR calc non Af Amer: 43 mL/min — ABNORMAL LOW (ref >60)
Glucose, Bld: 86 mg/dL (ref 70–99)
Potassium: 4.2 mmol/L (ref 3.5–5.1)
Sodium: 143 mmol/L (ref 135–145)
Total Bilirubin: 0.4 mg/dL (ref 0.3–1.2)
Total Protein: 6.3 g/dL — ABNORMAL LOW (ref 6.5–8.1)

## 2020-08-07 LAB — CBC WITH DIFFERENTIAL/PLATELET
Abs Immature Granulocytes: 0.01 10*3/uL (ref 0.00–0.07)
Basophils Absolute: 0.1 10*3/uL (ref 0.0–0.1)
Basophils Relative: 1 %
Eosinophils Absolute: 0.2 10*3/uL (ref 0.0–0.5)
Eosinophils Relative: 5 %
HCT: 27.9 % — ABNORMAL LOW (ref 36.0–46.0)
Hemoglobin: 9 g/dL — ABNORMAL LOW (ref 12.0–15.0)
Immature Granulocytes: 0 %
Lymphocytes Relative: 20 %
Lymphs Abs: 0.7 10*3/uL (ref 0.7–4.0)
MCH: 33.7 pg (ref 26.0–34.0)
MCHC: 32.3 g/dL (ref 30.0–36.0)
MCV: 104.5 fL — ABNORMAL HIGH (ref 80.0–100.0)
Monocytes Absolute: 0.4 10*3/uL (ref 0.1–1.0)
Monocytes Relative: 11 %
Neutro Abs: 2.2 10*3/uL (ref 1.7–7.7)
Neutrophils Relative %: 63 %
Platelets: 112 10*3/uL — ABNORMAL LOW (ref 150–400)
RBC: 2.67 MIL/uL — ABNORMAL LOW (ref 3.87–5.11)
RDW: 15.1 % (ref 11.5–15.5)
WBC: 3.5 10*3/uL — ABNORMAL LOW (ref 4.0–10.5)
nRBC: 0 % (ref 0.0–0.2)

## 2020-08-07 MED ORDER — BORTEZOMIB CHEMO SQ INJECTION 3.5 MG (2.5MG/ML)
1.3000 mg/m2 | Freq: Once | INTRAMUSCULAR | Status: AC
  Administered 2020-08-07: 2 mg via SUBCUTANEOUS
  Filled 2020-08-07: qty 0.8

## 2020-08-07 MED ORDER — DEXAMETHASONE 4 MG PO TABS
10.0000 mg | ORAL_TABLET | Freq: Once | ORAL | Status: AC
  Administered 2020-08-07: 10 mg via ORAL

## 2020-08-07 MED ORDER — DEXAMETHASONE 4 MG PO TABS
ORAL_TABLET | ORAL | Status: AC
  Filled 2020-08-07: qty 3

## 2020-08-07 NOTE — Progress Notes (Signed)
Amanda Wu presents today for D1C29 Velcade injection. Pt denies any new changes or symptoms since last treatment. Lab results and vitals have been reviewed and are stable and within parameters for treatment.  Injection tolerated without incident or complaint. Discharged in satisfactory condition with follow up instructions.

## 2020-08-07 NOTE — Patient Instructions (Signed)
[facility] Discharge Instructions for Patients Receiving Chemotherapy   Beginning January 23rd 2017 lab work for the Cancer Center will be done in the  Main lab at [place] on 1st floor. If you have a lab appointment with the Cancer Center please come in thru the  Main Entrance and check in at the main information desk   Today you received the following chemotherapy agents Velcade  To help prevent nausea and vomiting after your treatment, we encourage you to take your nausea medication    If you develop nausea and vomiting, or diarrhea that is not controlled by your medication, call the clinic.  The clinic phone number is [phone]. Office hours are Monday-Friday 8:30am-5:00pm.  BELOW ARE SYMPTOMS THAT SHOULD BE REPORTED IMMEDIATELY:  *FEVER GREATER THAN 101.0 F  *CHILLS WITH OR WITHOUT FEVER  NAUSEA AND VOMITING THAT IS NOT CONTROLLED WITH YOUR NAUSEA MEDICATION  *UNUSUAL SHORTNESS OF BREATH  *UNUSUAL BRUISING OR BLEEDING  TENDERNESS IN MOUTH AND THROAT WITH OR WITHOUT PRESENCE OF ULCERS  *URINARY PROBLEMS  *BOWEL PROBLEMS  UNUSUAL RASH Items with * indicate a potential emergency and should be followed up as soon as possible. If you have an emergency after office hours please contact your primary care physician or go to the nearest emergency department.  Please call the clinic during office hours if you have any questions or concerns.   You may also contact the Patient Navigator at [phone] should you have any questions or need assistance in obtaining follow up care.      Resources For Cancer Patients and their Caregivers ? American Cancer Society: Can assist with transportation, wigs, general needs, runs Look Good Feel Better.        1-888-227-6333 ? Cancer Care: Provides financial assistance, online support groups, medication/co-pay assistance.  1-800-813-HOPE (4673) ? [facility] Assists [place] cancer  patients and their families through emotional , educational and financial support.  336-427-4357 ? [place] DSS Where to apply for food stamps, Medicaid and utility assistance. 336-342-1394 ? [facility]: Transportation to medical appointments. 336-347-2287 ? Social Security Administration: May apply for disability if have a Stage IV cancer. 336-342-7796 1-800-772-1213 ? [place] Aging, Disability and Transit Services: Assists with nutrition, care and transit needs. 336-349-2343          

## 2020-08-08 ENCOUNTER — Other Ambulatory Visit (HOSPITAL_COMMUNITY): Payer: Self-pay | Admitting: Hematology

## 2020-08-08 DIAGNOSIS — R41841 Cognitive communication deficit: Secondary | ICD-10-CM | POA: Diagnosis not present

## 2020-08-08 DIAGNOSIS — C9000 Multiple myeloma not having achieved remission: Secondary | ICD-10-CM

## 2020-08-08 DIAGNOSIS — R488 Other symbolic dysfunctions: Secondary | ICD-10-CM | POA: Diagnosis not present

## 2020-08-08 DIAGNOSIS — Z9181 History of falling: Secondary | ICD-10-CM | POA: Diagnosis not present

## 2020-08-08 DIAGNOSIS — I10 Essential (primary) hypertension: Secondary | ICD-10-CM | POA: Diagnosis not present

## 2020-08-08 NOTE — Telephone Encounter (Signed)
Chart reviewed, Revlimid refilled per office visit note from today.

## 2020-08-09 ENCOUNTER — Encounter: Payer: Self-pay | Admitting: Adult Health

## 2020-08-09 ENCOUNTER — Non-Acute Institutional Stay (SKILLED_NURSING_FACILITY): Payer: Medicare PPO | Admitting: Adult Health

## 2020-08-09 DIAGNOSIS — R41841 Cognitive communication deficit: Secondary | ICD-10-CM | POA: Diagnosis not present

## 2020-08-09 DIAGNOSIS — D696 Thrombocytopenia, unspecified: Secondary | ICD-10-CM | POA: Diagnosis not present

## 2020-08-09 DIAGNOSIS — S32010D Wedge compression fracture of first lumbar vertebra, subsequent encounter for fracture with routine healing: Secondary | ICD-10-CM

## 2020-08-09 DIAGNOSIS — R488 Other symbolic dysfunctions: Secondary | ICD-10-CM | POA: Diagnosis not present

## 2020-08-09 DIAGNOSIS — C9000 Multiple myeloma not having achieved remission: Secondary | ICD-10-CM

## 2020-08-09 DIAGNOSIS — Z9181 History of falling: Secondary | ICD-10-CM | POA: Diagnosis not present

## 2020-08-09 DIAGNOSIS — I10 Essential (primary) hypertension: Secondary | ICD-10-CM | POA: Diagnosis not present

## 2020-08-09 NOTE — Progress Notes (Signed)
Location:    [place] Room Number: 148/W Place of Service:  SNF (31)   CODE STATUS: Full Code  No Known Allergies  Chief Complaint  Patient presents with  . Short Term Rehab (STR)            Compression fracture of L1 vertebrae with routinely healing subsequent encounter  Thrombocytopenia:  Multiple myeloma without achieving remission    Weekly follow up for the first 30 days post admission.     HPI:  She is a 74 year old long term resident of this facility being seen for the management of her chronic illnesses: Compression fracture of L1 vertebrae with routinely healing subsequent encounter  Thrombocytopenia:  Multiple myeloma without achieving remission.   There are no reports of uncontrolled pain; no reports of agitation or anxiety. No reports of insomnia.   Past Medical History:  Diagnosis Date  . Benign hypertension   . Cataract   . 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 Diabetes mellitus without complication ([place])   . H/O partial nephrectomy   . Hypokalemia   . Hypothyroidism   . Impaired cognition   . Multiple myeloma (HCC)    Dr Maylon Peppers, [facility]  . Thyroid disease     Past Surgical History:  Procedure Laterality Date  . ABDOMINAL HYSTERECTOMY    . CRANIOTOMY     for lymphoma  . YAG LASER APPLICATION Right 8/93/7342   Procedure: YAG LASER APPLICATION;  Surgeon: [name], MD;  Location: [facility];  Service: Ophthalmology;  Laterality: Right;    Social History   Socioeconomic History  . Marital status: Single    Spouse name: Not on file  . Number of children: Not on file  . Years of education: Not on file  . Highest education level: Not on file  Occupational History  . Not on file  Tobacco Use  . Smoking status: Never Smoker  . Smokeless tobacco: Never Used  Substance and Sexual Activity  . Alcohol use: No  . Drug use: No  . Sexual activity: Not on file  Other Topics Concern  . Not on file  Social History Narrative  .  Not on file   Social Determinants of Health   Financial Resource Strain:   . Difficulty of Paying Living Expenses: Not on file  Food Insecurity:   . Worried About Charity fundraiser in the Last Year: Not on file  . Ran Out of Food in the Last Year: Not on file  Transportation Needs:   . Lack of Transportation (Medical): Not on file  . Lack of Transportation (Non-Medical): Not on file  Physical Activity:   . Days of Exercise per Week: Not on file  . Minutes of Exercise per Session: Not on file  Stress:   . Feeling of Stress : Not on file  Social Connections:   . Frequency of Communication with Friends and Family: Not on file  . Frequency of Social Gatherings with Friends and Family: Not on file  . Attends Religious Services: Not on file  . Active Member of Clubs or Organizations: Not on file  . Attends Archivist Meetings: Not on file  . Marital Status: Not on file  Intimate Partner Violence:   . Fear of Current or Ex-Partner: Not on file  . Emotionally Abused: Not on file  . Physically Abused: Not on file  . Sexually Abused: Not on file   Family History  Problem Relation Age  of Onset  . Depression Mother   . Diabetes Mother   . Heart disease Father   . Cancer - Prostate Brother   . Cancer Brother   . Cancer Sister       VITAL SIGNS BP 110/67   Pulse 79   Temp 98.5 F (36.9 C) (Oral)   Resp 20   Ht _0  (1.753 m)   Wt 105 lb 6.4 oz (47.8 kg)   SpO2 97%   BMI 15.56 kg/m   Facility-Administered Encounter Medications as of 08/09/2020  Medication  . prochlorperazine (COMPAZINE) tablet 10 mg   Outpatient Encounter Medications as of 08/09/2020  Medication Sig  . acetaminophen (TYLENOL) 500 MG tablet Take 1,000 mg by mouth every 6 (six) hours as needed for mild pain.   Marland Kitchen acyclovir (ZOVIRAX) 400 MG tablet TAKE 1 TABLET BY MOUTH TWICE DAILY  . aspirin EC 81 MG tablet Take 81 mg by mouth daily.  . calcium-vitamin D (OSCAL WITH D) 500-200 MG-UNIT tablet  Take 1 tablet by mouth daily with breakfast.  . donepezil (ARICEPT) 10 MG tablet TAKE 1 TABLET BY MOUTH every evening  . ferrous sulfate 325 (65 FE) MG tablet Take 325 mg by mouth daily with breakfast.  . lenalidomide (REVLIMID) 10 MG capsule TAKE 1 CAPSULE BY MOUTH  DAILY FOR 14 DAYS, THEN 14  DAYS OFF  . levothyroxine (SYNTHROID, LEVOTHROID) 25 MCG tablet Take 25 mcg by mouth daily before breakfast.  . losartan (COZAAR) 50 MG tablet Take 1 tablet (50 mg total) by mouth daily.  . Multiple Vitamins-Minerals (MULTIVITAMIN WOMEN 50+ PO) Take by mouth.  . NON FORMULARY Diet: _____ Regular, ___x___ NAS, _______Consistent Carbohydrate, _______NPO _____Other  . NON FORMULARY Wanderguard #1335 to ankle for safety awareness. Check placement and function qshift. Every Shift Day, Evening, Night  . Nutritional Supplements (ENSURE CLEAR PO) Take by mouth 2 (two) times daily. Due to variable meal intake, cognitive impairment and low BMI  . potassium chloride (KLOR-CON) 10 MEQ tablet Takes two tablets daily     SIGNIFICANT DIAGNOSTIC EXAMS   LABS REVIEWED PREVIOUS    07-20-20: vit B 12: 620  07-23-20: tsh 2.264 07-24-20: wbc 3.9; hgb 9.3; hct 29.3 mcv 105.4 plt 105; glucose 106; bun 25; creat 1.28; k+ 3.6; na++ 142; a 9.2 liver normal albumin 3.5  07-30-20: chol 116; ldl 49; trig 71; hdl 53; hgb a1c 5.1; hep C neg   NO NEW LABS.    Review of Systems  Constitutional: Negative for malaise/fatigue.  Respiratory: Negative for cough and shortness of breath.   Cardiovascular: Negative for chest pain, palpitations and leg swelling.  Gastrointestinal: Negative for abdominal pain, constipation and heartburn.  Musculoskeletal: Negative for back pain, joint pain and myalgias.  Skin: Negative.   Neurological: Negative for dizziness.  Psychiatric/Behavioral: The patient is not nervous/anxious.       Physical Exam Constitutional:      General: She is not in acute distress.    Appearance: She is  well-developed. She is not diaphoretic.  Neck:     Thyroid: No thyromegaly.  Cardiovascular:     Rate and Rhythm: Normal rate and regular rhythm.     Pulses: Normal pulses.     Heart sounds: Murmur heard.      Comments: 2/6 pedal pulses faint Pulmonary:     Effort: Pulmonary effort is normal. No respiratory distress.     Breath sounds: Normal breath sounds.  Abdominal:     General: Bowel sounds are normal. There  is no distension.     Palpations: Abdomen is soft.     Tenderness: There is no abdominal tenderness.  Musculoskeletal:        General: Normal range of motion.     Cervical back: Neck supple.     Right lower leg: No edema.     Left lower leg: No edema.  Lymphadenopathy:     Cervical: No cervical adenopathy.  Skin:    General: Skin is warm and dry.  Neurological:     Mental Status: She is alert. Mental status is at baseline.  Psychiatric:        Mood and Affect: Mood normal.      ASSESSMENT/ PLAN:  TODAY  1. Compression fracture of L1 vertebrae with routinely healing subsequent encounter (06/2017) is stable has prn tylenol  2. Thrombocytopenia: is stable plt 105 will monitor   3. Multiple myeloma without achieving remission: is stable is followed by oncology will continue revlimid 100 mg on 14 days off 14 days. Will monitor    PREVIOUS   4.  Macrocytic anemia: is stable hgb 9.3 vit B 12: 620; will continue iron daily   5. Vascular dementia with behavioral disturbance: is stable weight is 105 pounds will continue aricept 10 mg daily if her weight continues to decline will need to lower this medication  6. Herpes: no recent outbreak: will continue acyclovir 400 mg twice daily   7. Failure to thrive in adult: is without change weight is 105 pounds; albumin is 3.5 will continue supplements as directed  8. Hypokalemia: is stable k+ 3.6 will continue k+ 20 meq daily   9. Hypothyroidism unspecified type: is stable tsh 2.264 will continue synthroid 25 mcg daily     10. Hypertension associated with type 2 diabetes mellitus: is stable b/p 110/67 will continue cozaar 50 mg daily and asa 81 mg daily   11. Diabetes mellitus without complication: is stable hgb a1c 5.1 is on arb asa      MD is aware of resident's narcotic use and is in agreement with current plan of care. We will attempt to wean resident as appropriate.  Ok Edwards NP [facility] Adult [facility]  Contact [phone] Monday through Friday 8am- 5pm  After hours call [phone]

## 2020-08-10 DIAGNOSIS — M79675 Pain in left toe(s): Secondary | ICD-10-CM | POA: Diagnosis not present

## 2020-08-10 DIAGNOSIS — L603 Nail dystrophy: Secondary | ICD-10-CM | POA: Diagnosis not present

## 2020-08-10 DIAGNOSIS — Z7984 Long term (current) use of oral hypoglycemic drugs: Secondary | ICD-10-CM | POA: Diagnosis not present

## 2020-08-10 DIAGNOSIS — B351 Tinea unguium: Secondary | ICD-10-CM | POA: Diagnosis not present

## 2020-08-10 DIAGNOSIS — R262 Difficulty in walking, not elsewhere classified: Secondary | ICD-10-CM | POA: Diagnosis not present

## 2020-08-10 DIAGNOSIS — M79674 Pain in right toe(s): Secondary | ICD-10-CM | POA: Diagnosis not present

## 2020-08-10 DIAGNOSIS — R937 Abnormal findings on diagnostic imaging of other parts of musculoskeletal system: Secondary | ICD-10-CM | POA: Diagnosis not present

## 2020-08-10 DIAGNOSIS — E1151 Type 2 diabetes mellitus with diabetic peripheral angiopathy without gangrene: Secondary | ICD-10-CM | POA: Diagnosis not present

## 2020-08-10 DIAGNOSIS — M810 Age-related osteoporosis without current pathological fracture: Secondary | ICD-10-CM | POA: Diagnosis not present

## 2020-08-10 DIAGNOSIS — M2141 Flat foot [pes planus] (acquired), right foot: Secondary | ICD-10-CM | POA: Diagnosis not present

## 2020-08-13 DIAGNOSIS — Z1159 Encounter for screening for other viral diseases: Secondary | ICD-10-CM | POA: Diagnosis not present

## 2020-08-13 DIAGNOSIS — C8590 Non-Hodgkin lymphoma, unspecified, unspecified site: Secondary | ICD-10-CM | POA: Diagnosis not present

## 2020-08-13 DIAGNOSIS — Z9181 History of falling: Secondary | ICD-10-CM | POA: Diagnosis not present

## 2020-08-13 DIAGNOSIS — R488 Other symbolic dysfunctions: Secondary | ICD-10-CM | POA: Diagnosis not present

## 2020-08-13 DIAGNOSIS — I10 Essential (primary) hypertension: Secondary | ICD-10-CM | POA: Diagnosis not present

## 2020-08-13 DIAGNOSIS — R41841 Cognitive communication deficit: Secondary | ICD-10-CM | POA: Diagnosis not present

## 2020-08-14 ENCOUNTER — Inpatient Hospital Stay (HOSPITAL_COMMUNITY): Payer: Medicare PPO

## 2020-08-14 ENCOUNTER — Encounter: Payer: Self-pay | Admitting: Adult Health

## 2020-08-14 ENCOUNTER — Non-Acute Institutional Stay (SKILLED_NURSING_FACILITY): Payer: Medicare PPO | Admitting: Adult Health

## 2020-08-14 ENCOUNTER — Encounter (HOSPITAL_COMMUNITY): Payer: Self-pay

## 2020-08-14 VITALS — BP 107/45 | HR 53 | Temp 96.8°F | Resp 16 | Wt 103.6 lb

## 2020-08-14 DIAGNOSIS — M810 Age-related osteoporosis without current pathological fracture: Secondary | ICD-10-CM

## 2020-08-14 DIAGNOSIS — Z5111 Encounter for antineoplastic chemotherapy: Secondary | ICD-10-CM | POA: Diagnosis not present

## 2020-08-14 DIAGNOSIS — C9000 Multiple myeloma not having achieved remission: Secondary | ICD-10-CM

## 2020-08-14 LAB — CBC WITH DIFFERENTIAL/PLATELET
Abs Immature Granulocytes: 0.01 10*3/uL (ref 0.00–0.07)
Basophils Absolute: 0 10*3/uL (ref 0.0–0.1)
Basophils Relative: 1 %
Eosinophils Absolute: 0.1 10*3/uL (ref 0.0–0.5)
Eosinophils Relative: 4 %
HCT: 29.2 % — ABNORMAL LOW (ref 36.0–46.0)
Hemoglobin: 9.4 g/dL — ABNORMAL LOW (ref 12.0–15.0)
Immature Granulocytes: 0 %
Lymphocytes Relative: 24 %
Lymphs Abs: 0.7 10*3/uL (ref 0.7–4.0)
MCH: 33.6 pg (ref 26.0–34.0)
MCHC: 32.2 g/dL (ref 30.0–36.0)
MCV: 104.3 fL — ABNORMAL HIGH (ref 80.0–100.0)
Monocytes Absolute: 0.4 10*3/uL (ref 0.1–1.0)
Monocytes Relative: 16 %
Neutro Abs: 1.5 10*3/uL — ABNORMAL LOW (ref 1.7–7.7)
Neutrophils Relative %: 55 %
Platelets: 101 10*3/uL — ABNORMAL LOW (ref 150–400)
RBC: 2.8 MIL/uL — ABNORMAL LOW (ref 3.87–5.11)
RDW: 15.7 % — ABNORMAL HIGH (ref 11.5–15.5)
WBC: 2.8 10*3/uL — ABNORMAL LOW (ref 4.0–10.5)
nRBC: 0 % (ref 0.0–0.2)

## 2020-08-14 LAB — COMPREHENSIVE METABOLIC PANEL
ALT: 29 U/L (ref 0–44)
AST: 34 U/L (ref 15–41)
Albumin: 3.1 g/dL — ABNORMAL LOW (ref 3.5–5.0)
Alkaline Phosphatase: 55 U/L (ref 38–126)
Anion gap: 7 (ref 5–15)
BUN: 27 mg/dL — ABNORMAL HIGH (ref 8–23)
CO2: 21 mmol/L — ABNORMAL LOW (ref 22–32)
Calcium: 9 mg/dL (ref 8.9–10.3)
Chloride: 112 mmol/L — ABNORMAL HIGH (ref 98–111)
Creatinine, Ser: 1.41 mg/dL — ABNORMAL HIGH (ref 0.44–1.00)
GFR calc Af Amer: 42 mL/min — ABNORMAL LOW (ref >60)
GFR calc non Af Amer: 36 mL/min — ABNORMAL LOW (ref >60)
Glucose, Bld: 71 mg/dL (ref 70–99)
Potassium: 4.3 mmol/L (ref 3.5–5.1)
Sodium: 140 mmol/L (ref 135–145)
Total Bilirubin: 0.6 mg/dL (ref 0.3–1.2)
Total Protein: 6.1 g/dL — ABNORMAL LOW (ref 6.5–8.1)

## 2020-08-14 LAB — LACTATE DEHYDROGENASE: LDH: 160 U/L (ref 98–192)

## 2020-08-14 MED ORDER — DEXAMETHASONE 4 MG PO TABS
10.0000 mg | ORAL_TABLET | Freq: Once | ORAL | Status: AC
  Administered 2020-08-14: 10 mg via ORAL
  Filled 2020-08-14: qty 3

## 2020-08-14 MED ORDER — BORTEZOMIB CHEMO SQ INJECTION 3.5 MG (2.5MG/ML)
1.3000 mg/m2 | Freq: Once | INTRAMUSCULAR | Status: AC
  Administered 2020-08-14: 2 mg via SUBCUTANEOUS
  Filled 2020-08-14: qty 0.8

## 2020-08-14 MED ORDER — CYANOCOBALAMIN 1000 MCG/ML IJ SOLN
INTRAMUSCULAR | Status: AC
  Filled 2020-08-14: qty 1

## 2020-08-14 NOTE — Patient Instructions (Signed)
[facility] Discharge Instructions for Patients Receiving Chemotherapy   Beginning January 23rd 2017 lab work for the Cancer Center will be done in the  Main lab at [place] on 1st floor. If you have a lab appointment with the Cancer Center please come in thru the  Main Entrance and check in at the main information desk   Today you received the following chemotherapy agents Velcade injection. Follow-up as scheduled  To help prevent nausea and vomiting after your treatment, we encourage you to take your nausea medication   If you develop nausea and vomiting, or diarrhea that is not controlled by your medication, call the clinic.  The clinic phone number is [phone]. Office hours are Monday-Friday 8:30am-5:00pm.  BELOW ARE SYMPTOMS THAT SHOULD BE REPORTED IMMEDIATELY:  *FEVER GREATER THAN 101.0 F  *CHILLS WITH OR WITHOUT FEVER  NAUSEA AND VOMITING THAT IS NOT CONTROLLED WITH YOUR NAUSEA MEDICATION  *UNUSUAL SHORTNESS OF BREATH  *UNUSUAL BRUISING OR BLEEDING  TENDERNESS IN MOUTH AND THROAT WITH OR WITHOUT PRESENCE OF ULCERS  *URINARY PROBLEMS  *BOWEL PROBLEMS  UNUSUAL RASH Items with * indicate a potential emergency and should be followed up as soon as possible. If you have an emergency after office hours please contact your primary care physician or go to the nearest emergency department.  Please call the clinic during office hours if you have any questions or concerns.   You may also contact the Patient Navigator at [phone] should you have any questions or need assistance in obtaining follow up care.      Resources For Cancer Patients and their Caregivers ? American Cancer Society: Can assist with transportation, wigs, general needs, runs Look Good Feel Better.        1-888-227-6333 ? Cancer Care: Provides financial assistance, online support groups, medication/co-pay assistance.  1-800-813-HOPE (4673) ? [facility] Assists [place] cancer patients and their families through emotional , educational and financial support.  336-427-4357 ? [place] DSS Where to apply for food stamps, Medicaid and utility assistance. 336-342-1394 ? [facility]: Transportation to medical appointments. 336-347-2287 ? Social Security Administration: May apply for disability if have a Stage IV cancer. 336-342-7796 1-800-772-1213 ? [place] Aging, Disability and Transit Services: Assists with nutrition, care and transit needs. 336-349-2343         

## 2020-08-14 NOTE — Progress Notes (Signed)
Location:    [facility] Room Number: 148/W Place of Service:  SNF (31)   CODE STATUS: Full Code  No Known Allergies  Chief Complaint  Patient presents with   Acute Visit    Osteoporosis    HPI:  She had a dexa scan performed on 08-10-20. The t score is -3.758. she is able to ambulate; denies any pain. Does have a history of lumbar compression fracture.   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[place])    Diabetes mellitus without complication ([place])    H/O partial nephrectomy    Hypokalemia    Hypothyroidism    Impaired cognition    Multiple myeloma (HCC)    Dr Maylon Peppers, [facility]   Thyroid disease     Past Surgical History:  Procedure Laterality Date   ABDOMINAL HYSTERECTOMY     CRANIOTOMY     for lymphoma   YAG LASER APPLICATION Right 5/36/4680   Procedure: YAG LASER APPLICATION;  Surgeon: [name], MD;  Location: [facility] OR[facility];  Service: Ophthalmology;  Laterality: Right;    Social History   Socioeconomic History   Marital status: Single    Spouse name: Not on file   Number of children: Not on file   Years of education: Not on file   Highest education level: Not on file  Occupational History   Not on file  Tobacco Use   Smoking status: Never Smoker   Smokeless tobacco: Never Used  Substance and Sexual Activity   Alcohol use: No   Drug use: No   Sexual activity: Not on file  Other Topics Concern   Not on file  Social History Narrative   Not on file   Social Determinants of Health   Financial Resource Strain:    Difficulty of Paying Living Expenses: Not on file  Food Insecurity:    Worried About [place] in the Last Year: Not on file   Ran Out of Food in the Last Year: Not on file  Transportation Needs:    Lack of Transportation (Medical): Not on file   Lack of Transportation (Non-Medical): Not on file  Physical  Activity:    Days of Exercise per Week: Not on file   Minutes of Exercise per Session: Not on file  Stress:    Feeling of Stress : Not on file  Social Connections:    Frequency of Communication with Friends and Family: Not on file   Frequency of Social Gatherings with Friends and Family: Not on file   Attends Religious Services: Not on file   Active Member of Clubs or Organizations: Not on file   Attends Archivist Meetings: Not on file   Marital Status: Not on file  Intimate Partner Violence:    Fear of Current or Ex-Partner: Not on file   Emotionally Abused: Not on file   Physically Abused: Not on file   Sexually Abused: Not on file   Family History  Problem Relation Age of Onset   Depression Mother    Diabetes Mother    Heart disease Father    Cancer - Prostate Brother    Cancer Brother    Cancer Sister       VITAL SIGNS BP 100/60    Pulse (!) 52    Temp 98.5 F (36.9 C) (Oral)    Resp 20    Ht _0  (  1.753 m)    Wt 104 lb (47.2 kg)    SpO2 97%    BMI 15.36 kg/m   Facility-Administered Encounter Medications as of 08/14/2020  Medication   prochlorperazine (COMPAZINE) tablet 10 mg   Outpatient Encounter Medications as of 08/14/2020  Medication Sig   acetaminophen (TYLENOL) 500 MG tablet Take 1,000 mg by mouth every 6 (six) hours as needed for mild pain.    acyclovir (ZOVIRAX) 400 MG tablet TAKE 1 TABLET BY MOUTH TWICE DAILY   aspirin EC 81 MG tablet Take 81 mg by mouth daily.   calcium-vitamin D (OSCAL WITH D) 500-200 MG-UNIT tablet Take 1 tablet by mouth daily with breakfast.   donepezil (ARICEPT) 10 MG tablet TAKE 1 TABLET BY MOUTH every evening   ferrous sulfate 325 (65 FE) MG tablet Take 325 mg by mouth daily with breakfast.   lenalidomide (REVLIMID) 10 MG capsule TAKE 1 CAPSULE BY MOUTH  DAILY FOR 14 DAYS, THEN 14  DAYS OFF   levothyroxine (SYNTHROID, LEVOTHROID) 25 MCG tablet Take 25 mcg by mouth daily before breakfast.    losartan (COZAAR) 50 MG tablet Take 1 tablet (50 mg total) by mouth daily.   Multiple Vitamins-Minerals (MULTIVITAMIN WOMEN 50+ PO) Take by mouth.   NON FORMULARY Diet: _____ Regular, ___x___ NAS, _______Consistent Carbohydrate, _______NPO _____Other   Augusto Gamble [phone] to ankle for safety awareness. Check placement and function qshift. Every Shift Day, Evening, Night   Nutritional Supplements (ENSURE CLEAR PO) Take by mouth 2 (two) times daily. Due to variable meal intake, cognitive impairment and low BMI   potassium chloride (KLOR-CON) 10 MEQ tablet Takes two tablets daily     SIGNIFICANT DIAGNOSTIC EXAMS  TODAY  08-10-20: t score: -3.758   LABS REVIEWED PREVIOUS    07-20-20: vit B 12: 620  07-23-20: tsh 2.264 07-24-20: wbc 3.9; hgb 9.3; hct 29.3 mcv 105.4 plt 105; glucose 106; bun 25; creat 1.28; k+ 3.6; na++ 142; a 9.2 liver normal albumin 3.5  07-30-20: chol 116; ldl 49; trig 71; hdl 53; hgb a1c 5.1; hep C neg   NO NEW LABS.   Review of Systems  Constitutional: Negative for malaise/fatigue.  Respiratory: Negative for cough and shortness of breath.   Cardiovascular: Negative for chest pain, palpitations and leg swelling.  Gastrointestinal: Negative for abdominal pain, constipation and heartburn.  Musculoskeletal: Negative for back pain, joint pain and myalgias.  Skin: Negative.   Neurological: Negative for dizziness.  Psychiatric/Behavioral: The patient is not nervous/anxious.    Physical Exam Constitutional:      General: She is not in acute distress.    Appearance: She is well-developed. She is not diaphoretic.  Neck:     Thyroid: No thyromegaly.  Cardiovascular:     Rate and Rhythm: Normal rate and regular rhythm.     Heart sounds: Murmur heard.      Comments: 2/6 pedal pulses faint Pulmonary:     Effort: Pulmonary effort is normal. No respiratory distress.     Breath sounds: Normal breath sounds.  Abdominal:     General: Bowel sounds are  normal. There is no distension.     Palpations: Abdomen is soft.     Tenderness: There is no abdominal tenderness.  Musculoskeletal:        General: Normal range of motion.     Cervical back: Neck supple.     Right lower leg: No edema.     Left lower leg: No edema.  Lymphadenopathy:     Cervical:  No cervical adenopathy.  Skin:    General: Skin is warm and dry.  Neurological:     Mental Status: She is alert. Mental status is at baseline.  Psychiatric:        Mood and Affect: Mood normal.      ASSESSMENT/ PLAN:  TODAY  1. Post menopausal osteoporosis:  t score -3.758 Will begin fosamax 70 mg weekly will continue calcium and vit D     MD is aware of resident's narcotic use and is in agreement with current plan of care. We will attempt to wean resident as appropriate.  Ok Edwards NP [facility]  Contact [phone] Monday through Friday 8am- 5pm  After hours call [phone]

## 2020-08-14 NOTE — Progress Notes (Signed)
[place] reviewed with RLockamy NP and pt approved for Velcade injection today per NP                                                        Amanda Wu tolerated Velcade injection well without complaints or incident. VSS Pt discharged via wheelchair in satisfactory condition

## 2020-08-15 ENCOUNTER — Non-Acute Institutional Stay (SKILLED_NURSING_FACILITY): Payer: Medicare PPO | Admitting: Adult Health

## 2020-08-15 ENCOUNTER — Encounter: Payer: Self-pay | Admitting: Adult Health

## 2020-08-15 DIAGNOSIS — Z0000 Encounter for general adult medical examination without abnormal findings: Secondary | ICD-10-CM | POA: Diagnosis not present

## 2020-08-15 DIAGNOSIS — Z9181 History of falling: Secondary | ICD-10-CM | POA: Diagnosis not present

## 2020-08-15 DIAGNOSIS — M810 Age-related osteoporosis without current pathological fracture: Secondary | ICD-10-CM | POA: Insufficient documentation

## 2020-08-15 DIAGNOSIS — I10 Essential (primary) hypertension: Secondary | ICD-10-CM | POA: Diagnosis not present

## 2020-08-15 DIAGNOSIS — R488 Other symbolic dysfunctions: Secondary | ICD-10-CM | POA: Diagnosis not present

## 2020-08-15 DIAGNOSIS — R41841 Cognitive communication deficit: Secondary | ICD-10-CM | POA: Diagnosis not present

## 2020-08-15 LAB — PROTEIN ELECTROPHORESIS, SERUM
A/G Ratio: 1.1 (ref 0.7–1.7)
Albumin ELP: 2.9 g/dL (ref 2.9–4.4)
Alpha-1-Globulin: 0.2 g/dL (ref 0.0–0.4)
Alpha-2-Globulin: 0.7 g/dL (ref 0.4–1.0)
Beta Globulin: 0.6 g/dL — ABNORMAL LOW (ref 0.7–1.3)
Gamma Globulin: 1.1 g/dL (ref 0.4–1.8)
Globulin, Total: 2.6 g/dL (ref 2.2–3.9)
M-Spike, %: 0.5 g/dL — ABNORMAL HIGH
Total Protein ELP: 5.5 g/dL — ABNORMAL LOW (ref 6.0–8.5)

## 2020-08-15 LAB — KAPPA/LAMBDA LIGHT CHAINS
Kappa free light chain: 30.9 mg/L — ABNORMAL HIGH (ref 3.3–19.4)
Kappa, lambda light chain ratio: 1.21 (ref 0.26–1.65)
Lambda free light chains: 25.5 mg/L (ref 5.7–26.3)

## 2020-08-15 NOTE — Patient Instructions (Signed)
  Amanda Wu , Thank you for taking time to come for your Medicare Wellness Visit. I appreciate your ongoing commitment to your health goals. Please review the following plan we discussed and let me know if I can assist you in the future.   These are the goals we discussed: Goals    . Follow up with Provider as scheduled    . General - Client will not be readmitted within 30 days (C-SNP)       This is a list of the screening recommended for you and due dates:  Health Maintenance  Topic Date Due  . Complete foot exam   Never done  . Eye exam for diabetics  Never done  . Urine Protein Check  Never done  . Colon Cancer Screening  Never done  . Pneumonia vaccines (2 of 2 - PCV13) 07/10/2018  . Flu Shot  09/13/2020*  . Hemoglobin A1C  01/30/2021  . Tetanus Vaccine  08/25/2025  . DEXA scan (bone density measurement)  Completed  . COVID-19 Vaccine  Completed  .  Hepatitis C: One time screening is recommended by Center for Disease Control  (CDC) for  adults born from [birth year] through 1967.   Completed  *Topic was postponed. The date shown is not the original due date.

## 2020-08-15 NOTE — Progress Notes (Signed)
Subjective:   Amanda Wu is a 74 y.o. female who presents for Medicare Annual (Subsequent) preventive examination. Long term of [place]   Review of Systems  Constitutional: Negative for malaise/fatigue.  Respiratory: Negative for cough and shortness of breath.   Cardiovascular: Negative for chest pain, palpitations and leg swelling.  Gastrointestinal: Negative for abdominal pain, constipation and heartburn.  Musculoskeletal: Negative for back pain, joint pain and myalgias.  Skin: Negative.   Neurological: Negative for dizziness.  Psychiatric/Behavioral: The patient is not nervous/anxious.     Cardiac Risk Factors include: advanced age (>51mn, >>85women);diabetes mellitus;sedentary lifestyle     Objective:    Today's Vitals   08/15/20 1052 08/15/20 1112  BP: 100/60   Pulse: (!) 52   Resp: 20   Temp: 98.5 F (36.9 C)   TempSrc: Oral   SpO2: 97%   Weight: 104 lb (47.2 kg)   Height: _0  (1.753 m)   PainSc:  0-No pain   Body mass index is 15.36 kg/m.  Advanced Directives 08/15/2020 08/14/2020 08/14/2020 08/09/2020 08/07/2020 08/01/2020 07/26/2020  Does Patient Have a Medical Advance Directive? _1  Yes Yes  Type of Advance Directive (No Data) (No Data) [place] will (No Data) [facility](No Data) (No Data)  Does patient want to make changes to medical advance directive? No - Patient declined No - Patient declined No - Patient declined No - Patient declined - No - Patient declined No - Patient declined  Copy of [place] Chart? - - No - copy requested - No - copy requested - -  Would patient like information on creating a medical advance directive? - - No - Patient declined - - - -    Current Medications (verified) Facility-Administered Encounter Medications as of 08/15/2020  Medication  . prochlorperazine (COMPAZINE) tablet 10 mg   Outpatient Encounter Medications as of 08/15/2020  Medication Sig    . acetaminophen (TYLENOL) 500 MG tablet Take 1,000 mg by mouth every 6 (six) hours as needed for mild pain.   .Marland Kitchenacyclovir (ZOVIRAX) 400 MG tablet TAKE 1 TABLET BY MOUTH TWICE DAILY  . alendronate (FOSAMAX) 70 MG tablet Take 70 mg by mouth once a week. On Monday,  for osteoporosis t score -3.758  . aspirin EC 81 MG tablet Take 81 mg by mouth daily.  . calcium-vitamin D (OSCAL WITH D) 500-200 MG-UNIT tablet Take 1 tablet by mouth daily with breakfast.  . donepezil (ARICEPT) 10 MG tablet TAKE 1 TABLET BY MOUTH every evening  . ferrous sulfate 325 (65 FE) MG tablet Take 325 mg by mouth daily with breakfast.  . lenalidomide (REVLIMID) 10 MG capsule TAKE 1 CAPSULE BY MOUTH  DAILY FOR 14 DAYS, THEN 14  DAYS OFF  . levothyroxine (SYNTHROID, LEVOTHROID) 25 MCG tablet Take 25 mcg by mouth daily before breakfast.  . losartan (COZAAR) 50 MG tablet Take 1 tablet (50 mg total) by mouth daily.  . Multiple Vitamins-Minerals (MULTIVITAMIN WOMEN 50+ PO) Take by mouth.  . NON FORMULARY Diet: _____ Regular, ___x___ NAS, _______Consistent Carbohydrate, _______NPO _____Other  . NON FORMULARY Wanderguard #1335 to ankle for safety awareness. Check placement and function qshift. Every Shift Day, Evening, Night  . Nutritional Supplements (ENSURE CLEAR PO) Take by mouth 2 (two) times daily. Due to variable meal intake, cognitive impairment and low BMI  . potassium chloride (KLOR-CON) 10 MEQ tablet Takes two tablets daily    Allergies (verified) Patient has  no known allergies.   History: Past Medical History:  Diagnosis Date  . Benign hypertension   . Cataract   . 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 Diabetes mellitus without complication ([place])   . H/O partial nephrectomy   . Hypokalemia   . Hypothyroidism   . Impaired cognition   . Multiple myeloma (HCC)    Dr Maylon Peppers, [facility]  . Thyroid disease    Past Surgical History:  Procedure Laterality Date  . ABDOMINAL HYSTERECTOMY    . CRANIOTOMY      for lymphoma  . YAG LASER APPLICATION Right 2/87/6811   Procedure: YAG LASER APPLICATION;  Surgeon: [name], MD;  Location: [facility];  Service: Ophthalmology;  Laterality: Right;   Family History  Problem Relation Age of Onset  . Depression Mother   . Diabetes Mother   . Heart disease Father   . Cancer - Prostate Brother   . Cancer Brother   . Cancer Sister    Social History   Socioeconomic History  . Marital status: Single    Spouse name: Not on file  . Number of children: Not on file  . Years of education: Not on file  . Highest education level: Not on file  Occupational History  . Occupation: retired   Tobacco Use  . Smoking status: Never Smoker  . Smokeless tobacco: Never Used  Substance and Sexual Activity  . Alcohol use: No  . Drug use: No  . Sexual activity: Never  Other Topics Concern  . Not on file  Social History Narrative   Long term resident of [facility]    Social Determinants of Health   Financial Resource Strain:   . Difficulty of Paying Living Expenses: Not on file  Food Insecurity:   . Worried About Charity fundraiser in the Last Year: Not on file  . Ran Out of Food in the Last Year: Not on file  Transportation Needs:   . Lack of Transportation (Medical): Not on file  . Lack of Transportation (Non-Medical): Not on file  Physical Activity:   . Days of Exercise per Week: Not on file  . Minutes of Exercise per Session: Not on file  Stress:   . Feeling of Stress : Not on file  Social Connections:   . Frequency of Communication with Friends and Family: Not on file  . Frequency of Social Gatherings with Friends and Family: Not on file  . Attends Religious Services: Not on file  . Active Member of Clubs or Organizations: Not on file  . Attends Archivist Meetings: Not on file  . Marital Status: Not on file    Tobacco Counseling Counseling given: Not Answered   Clinical Intake:  Pre-visit preparation completed: Yes  Pain :  No/denies pain Pain Score: 0-No pain     BMI - recorded: 15.36 Nutritional Status: BMI <19  Underweight Diabetes: Yes CBG done?:  (per nursing staff)     Diabetic yes   Interpreter Needed?: No      Activities of Daily Living In your present state of health, do you have any difficulty performing the following activities: 08/15/2020  Hearing? N  Vision? N  Difficulty concentrating or making decisions? N  Walking or climbing stairs? Y  Dressing or bathing? Y  Doing errands, shopping? Y  Preparing Food and eating ? Y  Using the Toilet? Y  In the past six months, have you accidently leaked urine? Y  Do you have problems with  loss of bowel control? Y  Managing your Medications? Y  Managing your Finances? Y  Housekeeping or managing your Housekeeping? Y  Some recent data might be hidden    Patient Care Team: Gerlene Fee, NP as PCP - General ([place], [place] ([place])  Indicate any recent [place] you may have received from other than [facility] providers in the past year (date may be approximate).     Assessment:   This is a routine wellness examination for Amanda Wu.  Hearing/Vision screen No exam data present  Dietary issues and exercise activities discussed: Current Exercise Habits: The patient does not participate in regular exercise at present  Goals    . Follow up with Provider as scheduled    . General - Client will not be readmitted within 30 days ([facility])      Depression Screen PHQ 2/9 Scores 08/15/2020  PHQ - 2 Score 0    Fall Risk Fall Risk  08/15/2020  Falls in the past year? 1  Number falls in past yr: 0  Injury with Fall? 0    Any stairs in or around the home no If so, are there any without handrails? n/a Home free of loose throw rugs in walkways, pet beds, electrical cords, etc? yes Adequate lighting in your home to reduce risk of falls? Yes   ASSISTIVE DEVICES UTILIZED TO PREVENT FALLS:  Life  alert? N/a Use of a cane, walker or w/c? N/a Grab bars in the bathroom? yes Shower chair or bench in shower? Yes  Elevated toilet seat or a handicapped toilet?yes  TIMED UP AND GO:  Was the test performed? yes  Length of time to ambulate 10 feet: 15 sec.    Cognitive Function:     6CIT Screen 08/15/2020  What Year? 0 points  What month? 0 points  What time? 0 points  Count back from 20 2 points  Months in reverse 2 points  Repeat phrase 4 points  Total Score 8    Immunizations Immunization History  Administered Date(s) Administered  . Fluad Quad(high Dose 65+) 08/30/2019  . Influenza, High Dose Seasonal PF 11/15/2015, 10/16/2016, 09/17/2017, 09/14/2018  . Influenza,inj,Quad PF,6+ Mos 09/21/2014  . Influenza-Unspecified 09/25/2011, 10/14/2012, 10/20/2013, 09/21/2014, 11/15/2015, 10/16/2016  . Moderna SARS-COVID-2 Vaccination 02/16/2020, 03/12/2020  . Pneumococcal Polysaccharide-23 07/10/2017  . Pneumococcal-Unspecified 11/20/2010  . Tdap 10/14/2012, 08/26/2015    Qualifies for Shingles Vaccine? Yes   Screening Tests Health Maintenance  Topic Date Due  . FOOT EXAM  Never done  . OPHTHALMOLOGY EXAM  Never done  . URINE MICROALBUMIN  Never done  . COLONOSCOPY  Never done  . PNA vac Low Risk Adult (2 of 2 - PCV13) 07/10/2018  . INFLUENZA VACCINE  09/13/2020 (Originally 07/15/2020)  . HEMOGLOBIN A1C  01/30/2021  . TETANUS/TDAP  08/25/2025  . DEXA SCAN  Completed  . COVID-19 Vaccine  Completed  . Hepatitis C Screening  Completed    Health Maintenance  Health Maintenance Due  Topic Date Due  . FOOT EXAM  Never done  . OPHTHALMOLOGY EXAM  Never done  . URINE MICROALBUMIN  Never done  . COLONOSCOPY  Never done  . PNA vac Low Risk Adult (2 of 2 - PCV13) 07/10/2018    Lung Cancer Screening: (Low Dose CT Chest recommended if Age 35-80 years, 30 pack-year currently smoking OR have quit w/in 15years.) does not  qualify.   Lung Cancer Screening Referral:  n/a  Additional Screening:  Hepatitis C Screening:  does  Vision Screening: Recommended annual ophthalmology exams for early detection of glaucoma and other disorders of the eye. Is the patient up to date with their annual eye exam?     Dental Screening: Recommended annual dental exams for proper oral hygiene  Community Resource Referral / Chronic Care Management: CRR required this visit?  no  CCM required this visit?  no     Plan:     I have personally reviewed and noted the following in the patient's chart:   . Medical and social history . Use of alcohol, tobacco or illicit drugs  . Current medications and supplements . Functional ability and status . Nutritional status . Physical activity . Advanced directives . List of other physicians . Hospitalizations, surgeries, and ER visits in previous 12 months . Vitals . Screenings to include cognitive, depression, and falls . Referrals and appointments  In addition, I have reviewed and discussed with patient certain preventive protocols, quality metrics, and best practice recommendations. A written personalized care plan for preventive services as well as general preventive health recommendations were provided to patient.     Gerlene Fee, NP   08/15/2020

## 2020-08-16 ENCOUNTER — Other Ambulatory Visit (HOSPITAL_COMMUNITY): Payer: Self-pay | Admitting: Nurse Practitioner

## 2020-08-16 ENCOUNTER — Other Ambulatory Visit (HOSPITAL_COMMUNITY)
Admission: RE | Admit: 2020-08-16 | Discharge: 2020-08-16 | Disposition: A | Discharge status: Home / Self Care | Payer: Medicare PPO | Source: Skilled Nursing Facility | Attending: Adult Health | Admitting: Adult Health

## 2020-08-16 DIAGNOSIS — C8590 Non-Hodgkin lymphoma, unspecified, unspecified site: Secondary | ICD-10-CM | POA: Diagnosis not present

## 2020-08-16 DIAGNOSIS — C9000 Multiple myeloma not having achieved remission: Secondary | ICD-10-CM | POA: Diagnosis not present

## 2020-08-16 DIAGNOSIS — Z1159 Encounter for screening for other viral diseases: Secondary | ICD-10-CM | POA: Diagnosis not present

## 2020-08-16 DIAGNOSIS — D509 Iron deficiency anemia, unspecified: Secondary | ICD-10-CM | POA: Insufficient documentation

## 2020-08-16 LAB — URINALYSIS, COMPLETE (UACMP) WITH MICROSCOPIC
Bilirubin Urine: NEGATIVE
Glucose, UA: NEGATIVE mg/dL
Hgb urine dipstick: NEGATIVE
Ketones, ur: NEGATIVE mg/dL
Leukocytes,Ua: NEGATIVE
Nitrite: POSITIVE — AB
Protein, ur: NEGATIVE mg/dL
Specific Gravity, Urine: 1.014 (ref 1.005–1.030)
pH: 5 (ref 5.0–8.0)

## 2020-08-17 LAB — MICROALBUMIN, URINE, 24 HOUR
Microalb, 24H Ur: 1 mg/d (ref 0–29)
Microalb, Ur: 9.3 ug/mL — ABNORMAL HIGH
Total Volume: 60

## 2020-08-21 ENCOUNTER — Other Ambulatory Visit (HOSPITAL_COMMUNITY): Payer: Medicare PPO

## 2020-08-21 ENCOUNTER — Encounter (HOSPITAL_COMMUNITY): Payer: Self-pay | Admitting: Hematology

## 2020-08-21 ENCOUNTER — Inpatient Hospital Stay (HOSPITAL_COMMUNITY): Payer: Medicare PPO | Attending: Hematology

## 2020-08-21 ENCOUNTER — Ambulatory Visit (HOSPITAL_COMMUNITY): Payer: Medicare PPO

## 2020-08-21 ENCOUNTER — Inpatient Hospital Stay (HOSPITAL_COMMUNITY): Payer: Medicare PPO

## 2020-08-21 ENCOUNTER — Inpatient Hospital Stay (HOSPITAL_COMMUNITY): Payer: Medicare PPO | Attending: Hematology | Admitting: Hematology

## 2020-08-21 VITALS — BP 142/52 | HR 35 | Temp 97.7°F | Resp 18 | Wt 106.2 lb

## 2020-08-21 DIAGNOSIS — Z5111 Encounter for antineoplastic chemotherapy: Secondary | ICD-10-CM | POA: Insufficient documentation

## 2020-08-21 DIAGNOSIS — E1122 Type 2 diabetes mellitus with diabetic chronic kidney disease: Secondary | ICD-10-CM | POA: Diagnosis not present

## 2020-08-21 DIAGNOSIS — Z8572 Personal history of non-Hodgkin lymphomas: Secondary | ICD-10-CM | POA: Diagnosis not present

## 2020-08-21 DIAGNOSIS — N189 Chronic kidney disease, unspecified: Secondary | ICD-10-CM | POA: Diagnosis not present

## 2020-08-21 DIAGNOSIS — E039 Hypothyroidism, unspecified: Secondary | ICD-10-CM | POA: Diagnosis not present

## 2020-08-21 DIAGNOSIS — R634 Abnormal weight loss: Secondary | ICD-10-CM | POA: Insufficient documentation

## 2020-08-21 DIAGNOSIS — F0390 Unspecified dementia without behavioral disturbance: Secondary | ICD-10-CM | POA: Diagnosis not present

## 2020-08-21 DIAGNOSIS — Z79899 Other long term (current) drug therapy: Secondary | ICD-10-CM | POA: Diagnosis not present

## 2020-08-21 DIAGNOSIS — C9000 Multiple myeloma not having achieved remission: Secondary | ICD-10-CM | POA: Insufficient documentation

## 2020-08-21 DIAGNOSIS — R197 Diarrhea, unspecified: Secondary | ICD-10-CM | POA: Insufficient documentation

## 2020-08-21 DIAGNOSIS — I129 Hypertensive chronic kidney disease with stage 1 through stage 4 chronic kidney disease, or unspecified chronic kidney disease: Secondary | ICD-10-CM | POA: Diagnosis not present

## 2020-08-21 DIAGNOSIS — E1136 Type 2 diabetes mellitus with diabetic cataract: Secondary | ICD-10-CM | POA: Insufficient documentation

## 2020-08-21 DIAGNOSIS — Z1159 Encounter for screening for other viral diseases: Secondary | ICD-10-CM | POA: Diagnosis not present

## 2020-08-21 DIAGNOSIS — C8590 Non-Hodgkin lymphoma, unspecified, unspecified site: Secondary | ICD-10-CM | POA: Diagnosis not present

## 2020-08-21 LAB — CBC WITH DIFFERENTIAL/PLATELET
Abs Immature Granulocytes: 0.01 10*3/uL (ref 0.00–0.07)
Basophils Absolute: 0 10*3/uL (ref 0.0–0.1)
Basophils Relative: 1 %
Eosinophils Absolute: 0.1 10*3/uL (ref 0.0–0.5)
Eosinophils Relative: 3 %
HCT: 29.7 % — ABNORMAL LOW (ref 36.0–46.0)
Hemoglobin: 9.5 g/dL — ABNORMAL LOW (ref 12.0–15.0)
Immature Granulocytes: 0 %
Lymphocytes Relative: 18 %
Lymphs Abs: 0.7 10*3/uL (ref 0.7–4.0)
MCH: 33.3 pg (ref 26.0–34.0)
MCHC: 32 g/dL (ref 30.0–36.0)
MCV: 104.2 fL — ABNORMAL HIGH (ref 80.0–100.0)
Monocytes Absolute: 0.4 10*3/uL (ref 0.1–1.0)
Monocytes Relative: 11 %
Neutro Abs: 2.7 10*3/uL (ref 1.7–7.7)
Neutrophils Relative %: 67 %
Platelets: 86 10*3/uL — ABNORMAL LOW (ref 150–400)
RBC: 2.85 MIL/uL — ABNORMAL LOW (ref 3.87–5.11)
RDW: 15.5 % (ref 11.5–15.5)
WBC: 3.9 10*3/uL — ABNORMAL LOW (ref 4.0–10.5)
nRBC: 0 % (ref 0.0–0.2)

## 2020-08-21 LAB — COMPREHENSIVE METABOLIC PANEL
ALT: 28 U/L (ref 0–44)
AST: 37 U/L (ref 15–41)
Albumin: 3.1 g/dL — ABNORMAL LOW (ref 3.5–5.0)
Alkaline Phosphatase: 52 U/L (ref 38–126)
Anion gap: 8 (ref 5–15)
BUN: 18 mg/dL (ref 8–23)
CO2: 24 mmol/L (ref 22–32)
Calcium: 8.8 mg/dL — ABNORMAL LOW (ref 8.9–10.3)
Chloride: 112 mmol/L — ABNORMAL HIGH (ref 98–111)
Creatinine, Ser: 1.09 mg/dL — ABNORMAL HIGH (ref 0.44–1.00)
GFR calc Af Amer: 58 mL/min — ABNORMAL LOW (ref >60)
GFR calc non Af Amer: 50 mL/min — ABNORMAL LOW (ref >60)
Glucose, Bld: 86 mg/dL (ref 70–99)
Potassium: 3.7 mmol/L (ref 3.5–5.1)
Sodium: 144 mmol/L (ref 135–145)
Total Bilirubin: 0.9 mg/dL (ref 0.3–1.2)
Total Protein: 5.8 g/dL — ABNORMAL LOW (ref 6.5–8.1)

## 2020-08-21 MED ORDER — DEXAMETHASONE 4 MG PO TABS
10.0000 mg | ORAL_TABLET | Freq: Once | ORAL | Status: AC
  Administered 2020-08-21: 10 mg via ORAL

## 2020-08-21 MED ORDER — DEXAMETHASONE 4 MG PO TABS
ORAL_TABLET | ORAL | Status: AC
  Filled 2020-08-21: qty 3

## 2020-08-21 MED ORDER — BORTEZOMIB CHEMO SQ INJECTION 3.5 MG (2.5MG/ML)
1.3000 mg/m2 | Freq: Once | INTRAMUSCULAR | Status: AC
  Administered 2020-08-21: 2 mg via SUBCUTANEOUS
  Filled 2020-08-21: qty 0.8

## 2020-08-21 NOTE — Progress Notes (Signed)
Labs reviewed for treatment with Dr Raliegh Ip, platelets 86.  Okay to proceed with treatment.   Tolerated treatment well today.  Discharged via wheelchair.

## 2020-08-21 NOTE — Patient Instructions (Signed)
[place] at [facility] Discharge Instructions  You were seen today by Dr. Delton Coombes. He went over your recent results. You received your injection today; continue receiving your weekly injections. Dr. Delton Coombes will see you back in 8 weeks for labs and follow up.   Thank you for choosing [place] at [facility] to provide your oncology and hematology care.  To afford each patient quality time with our provider, please arrive at least 15 minutes before your scheduled appointment time.   If you have a lab appointment with the [facility] please come in thru the Main Entrance and check in at the main information desk  You need to re-schedule your appointment should you arrive 10 or more minutes late.  We strive to give you quality time with our providers, and arriving late affects you and other patients whose appointments are after yours.  Also, if you no show three or more times for appointments you may be dismissed from the clinic at the providers discretion.     Again, thank you for choosing [facility].  Our hope is that these requests will decrease the amount of time that you wait before being seen by our physicians.       _____________________________________________________________  Should you have questions after your visit to [facility], please contact our office at [phone] between the hours of 8:00 a.m. and 4:30 p.m.  Voicemails left after 4:00 p.m. will not be returned until the following business day.  For prescription refill requests, have your pharmacy contact our office and allow 72 hours.    Cancer Center Support Programs:   > Cancer Support Group  2nd Tuesday of the month 1pm-2pm, Journey Room

## 2020-08-21 NOTE — Patient Instructions (Signed)
[facility] Discharge Instructions for Patients Receiving Chemotherapy  Today you received the following chemotherapy agents   To help prevent nausea and vomiting after your treatment, we encourage you to take your nausea medication   If you develop nausea and vomiting that is not controlled by your nausea medication, call the clinic.   BELOW ARE SYMPTOMS THAT SHOULD BE REPORTED IMMEDIATELY:  *FEVER GREATER THAN 100.5 F  *CHILLS WITH OR WITHOUT FEVER  NAUSEA AND VOMITING THAT IS NOT CONTROLLED WITH YOUR NAUSEA MEDICATION  *UNUSUAL SHORTNESS OF BREATH  *UNUSUAL BRUISING OR BLEEDING  TENDERNESS IN MOUTH AND THROAT WITH OR WITHOUT PRESENCE OF ULCERS  *URINARY PROBLEMS  *BOWEL PROBLEMS  UNUSUAL RASH Items with * indicate a potential emergency and should be followed up as soon as possible.  Feel free to call the clinic should you have any questions or concerns. The clinic phone number is [phone].  Please show the CHEMO ALERT CARD at check-in to the Emergency Department and triage nurse.   

## 2020-08-21 NOTE — Progress Notes (Signed)
Amanda Wu, Amanda Wu [id number]   CLINIC:  Medical Oncology/Hematology  PCP:  Amanda Fee, NP [address] [place] [address] [phone]   REASON FOR VISIT:  Follow-up for multiple myeloma  PRIOR THERAPY: None   CURRENT THERAPY: RVD  BRIEF ONCOLOGIC HISTORY:  Oncology History  Multiple myeloma ([place])  07/09/2017 Initial Diagnosis   Multiple myeloma ([place])   06/01/2018 -  Chemotherapy   The patient had dexamethasone (DECADRON) tablet 10 mg, 10 mg (100 % of original dose 10 mg), Oral,  Once, 6 of 6 cycles Dose modification: 10 mg (original dose 10 mg, Cycle 1) Administration: 10 mg (06/01/2018), 10 mg (06/08/2018), 10 mg (06/29/2018), 10 mg (07/06/2018), 10 mg (07/27/2018), 10 mg (08/03/2018), 10 mg (08/10/2018), 10 mg (08/24/2018), 10 mg (08/31/2018), 10 mg (09/07/2018), 10 mg (09/21/2018), 10 mg (09/28/2018), 10 mg (10/05/2018), 10 mg (10/19/2018), 10 mg (10/26/2018), 10 mg (11/02/2018) dexamethasone (DECADRON) tablet 10 mg, 10 mg (100 % of original dose 10 mg), Oral,  Once, 23 of 25 cycles Dose modification: 10 mg (original dose 10 mg, Cycle 7) Administration: 10 mg (11/16/2018), 10 mg (11/23/2018), 10 mg (11/30/2018), 10 mg (12/17/2018), 10 mg (12/24/2018), 10 mg (01/04/2019), 10 mg (01/18/2019), 10 mg (01/25/2019), 10 mg (02/01/2019), 10 mg (02/15/2019), 10 mg (02/22/2019), 10 mg (03/01/2019), 10 mg (03/15/2019), 10 mg (03/23/2019), 10 mg (03/31/2019), 10 mg (04/12/2019), 10 mg (04/19/2019), 10 mg (04/26/2019), 10 mg (05/10/2019), 10 mg (05/17/2019), 10 mg (05/24/2019), 10 mg (06/07/2019), 10 mg (06/14/2019), 10 mg (06/21/2019), 10 mg (07/05/2019), 10 mg (07/12/2019), 10 mg (07/19/2019), 10 mg (08/02/2019), 10 mg (08/09/2019), 10 mg (08/16/2019), 10 mg (08/30/2019), 10 mg (09/06/2019), 10 mg (09/13/2019), 10 mg (09/27/2019), 10 mg (10/04/2019), 10 mg (10/11/2019), 10 mg (10/25/2019), 10 mg (11/08/2019), 10 mg (11/22/2019), 10 mg (11/29/2019), 10 mg (12/06/2019), 10 mg (12/20/2019), 10 mg  (12/27/2019), 10 mg (01/03/2020), 10 mg (01/17/2020), 10 mg (01/24/2020), 10 mg (01/31/2020), 10 mg (02/21/2020), 10 mg (02/28/2020), 10 mg (03/06/2020), 10 mg (03/20/2020), 10 mg (03/27/2020), 10 mg (04/03/2020), 10 mg (04/17/2020), 10 mg (04/24/2020), 10 mg (05/01/2020), 10 mg (05/15/2020), 10 mg (05/22/2020), 10 mg (05/29/2020), 10 mg (06/12/2020), 10 mg (06/19/2020), 10 mg (06/26/2020), 10 mg (07/10/2020), 10 mg (07/17/2020), 10 mg (07/24/2020), 10 mg (08/07/2020), 10 mg (08/14/2020) bortezomib SQ (VELCADE) chemo injection 1.75 mg, 1 mg/m2 = 1.75 mg (100 % of original dose 1 mg/m2), Subcutaneous,  Once, 29 of 31 cycles Dose modification: 1 mg/m2 (original dose 1 mg/m2, Cycle 1, Reason: Provider Judgment), 1.3 mg/m2 (original dose 1 mg/m2, Cycle 8, Reason: Provider Judgment) Administration: 1.75 mg (06/01/2018), 1.75 mg (06/08/2018), 1.75 mg (06/15/2018), 1.75 mg (06/29/2018), 1.75 mg (07/06/2018), 1.75 mg (07/13/2018), 1.75 mg (07/27/2018), 1.75 mg (08/03/2018), 1.75 mg (08/10/2018), 1.75 mg (08/24/2018), 1.75 mg (08/31/2018), 1.75 mg (09/07/2018), 1.75 mg (09/21/2018), 1.75 mg (09/28/2018), 1.75 mg (10/05/2018), 1.75 mg (10/19/2018), 1.75 mg (10/26/2018), 1.75 mg (11/02/2018), 1.75 mg (11/16/2018), 1.75 mg (11/23/2018), 1.75 mg (11/30/2018), 2.25 mg (12/17/2018), 2.25 mg (12/24/2018), 2.25 mg (01/04/2019), 2.25 mg (01/18/2019), 2.25 mg (01/25/2019), 2.25 mg (02/01/2019), 2.25 mg (02/15/2019), 2.25 mg (02/22/2019), 2.25 mg (03/01/2019), 2.25 mg (03/15/2019), 2.25 mg (03/23/2019), 2.25 mg (03/31/2019), 2.25 mg (04/12/2019), 2.25 mg (04/19/2019), 2.25 mg (04/26/2019), 2.25 mg (05/10/2019), 2.25 mg (05/17/2019), 2.25 mg (05/24/2019), 2.25 mg (06/07/2019), 2.25 mg (06/14/2019), 2.25 mg (06/21/2019), 2.25 mg (07/05/2019), 2.25 mg (07/12/2019), 2.25 mg (07/19/2019), 2.25 mg (08/02/2019), 2.25 mg (08/09/2019), 2.25 mg (08/16/2019), 2.25 mg (08/30/2019), 2.25 mg (09/06/2019), 2.25  mg (09/13/2019), 2.25 mg (09/27/2019), 2.25 mg (10/04/2019), 2.25 mg (10/11/2019), 2.25 mg (10/25/2019), 2.25 mg (11/08/2019),  2.25 mg (11/22/2019), 2.25 mg (11/29/2019), 2.25 mg (12/06/2019), 2.25 mg (12/20/2019), 2.25 mg (12/27/2019), 2.25 mg (01/03/2020), 2.25 mg (01/17/2020), 2.25 mg (01/24/2020), 2.25 mg (01/31/2020), 2.25 mg (02/21/2020), 2.25 mg (02/28/2020), 2.25 mg (03/06/2020), 2.25 mg (03/20/2020), 2.25 mg (03/27/2020), 2 mg (04/03/2020), 2 mg (04/17/2020), 2 mg (04/24/2020), 2 mg (05/01/2020), 2 mg (05/15/2020), 2 mg (05/22/2020), 2 mg (05/29/2020), 2 mg (06/12/2020), 2 mg (06/19/2020), 2 mg (06/26/2020), 2 mg (07/10/2020), 2 mg (07/17/2020), 2 mg (07/24/2020), 2 mg (08/07/2020), 2 mg (08/14/2020)  for chemotherapy treatment.      CANCER STAGING: Cancer Staging No matching staging information was found for the patient.  INTERVAL HISTORY:  [name], a 74 y.o. female, returns for routine follow-up and consideration for next cycle of chemotherapy. Amanda Wu was last seen on 07/10/2020.  Due for day #15 of cycle #29 of bortezomib today.   Today she is accompanied by her sister. Overall, she tells me she has been feeling pretty well. She is tolerating the Revlimid well, though she has been having watery diarrhea while taking Revlimid and while off of it. She takes Imodium for diarrhea and sometimes it works, while other things it doesn't help alleviate the diarrhea. She is not receiving Ensure and she is supposed to be receiving 2 cans daily.  She is currently living at [facility] with her sister Amanda Wu since 8/3.  Overall, she feels ready for next cycle of chemo today.    REVIEW OF SYSTEMS:  Review of Systems  Constitutional: Negative for appetite change and fatigue.  Gastrointestinal: Positive for diarrhea.  All other systems reviewed and are negative.   PAST MEDICAL/SURGICAL HISTORY:  Past Medical History:  Diagnosis Date  . Benign hypertension   . Cataract   . 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 Diabetes mellitus without complication ([place])   . H/O partial nephrectomy   . Hypokalemia   . Hypothyroidism   . Impaired  cognition   . Multiple myeloma ([facility])    Dr Amanda Wu, [facility]  . Thyroid disease    Past Surgical History:  Procedure Laterality Date  . ABDOMINAL HYSTERECTOMY    . CRANIOTOMY     for lymphoma  . YAG LASER APPLICATION Right 1/49/7026   Procedure: YAG LASER APPLICATION;  Surgeon: [name], MD;  Location: [facility];  Service: Ophthalmology;  Laterality: Right;    SOCIAL HISTORY:  Social History   Socioeconomic History  . Marital status: Single    Spouse name: Not on file  . Number of children: Not on file  . Years of education: Not on file  . Highest education level: Not on file  Occupational History  . Occupation: retired   Tobacco Use  . Smoking status: Never Smoker  . Smokeless tobacco: Never Used  Substance and Sexual Activity  . Alcohol use: No  . Drug use: No  . Sexual activity: Never  Other Topics Concern  . Not on file  Social History Narrative   Long term resident of [facility]    Social Determinants of Health   Financial Resource Strain:   . Difficulty of Paying Living Expenses: Not on file  Food Insecurity:   . Worried About Charity fundraiser in the Last Year: Not on file  . Ran Out of Food in the Last Year: Not on file  Transportation Needs:   . Lack of Transportation (Medical): Not  on file  . Lack of Transportation (Non-Medical): Not on file  Physical Activity:   . Days of Exercise per Week: Not on file  . Minutes of Exercise per Session: Not on file  Stress:   . Feeling of Stress : Not on file  Social Connections:   . Frequency of Communication with Friends and Family: Not on file  . Frequency of Social Gatherings with Friends and Family: Not on file  . Attends Religious Services: Not on file  . Active Member of Clubs or Organizations: Not on file  . Attends Archivist Meetings: Not on file  . Marital Status: Not on file  Intimate Partner Violence:   . Fear of Current or Ex-Partner: Not on file  . Emotionally Abused: Not on file  .  Physically Abused: Not on file  . Sexually Abused: Not on file    FAMILY HISTORY:  Family History  Problem Relation Age of Onset  . Depression Mother   . Diabetes Mother   . Heart disease Father   . Cancer - Prostate Brother   . Cancer Brother   . Cancer Sister     CURRENT MEDICATIONS:  No current facility-administered medications for this visit.   Current Outpatient Medications  Medication Sig Dispense Refill  . acyclovir (ZOVIRAX) 400 MG tablet TAKE 1 TABLET BY MOUTH TWICE DAILY 60 tablet 2  . donepezil (ARICEPT) 10 MG tablet TAKE 1 TABLET BY MOUTH every evening 30 tablet 3  . lenalidomide (REVLIMID) 10 MG capsule TAKE 1 CAPSULE BY MOUTH  DAILY FOR 14 DAYS, THEN 14  DAYS OFF 14 capsule 0   Facility-Administered Medications Ordered in Other Visits  Medication Dose Route Frequency Provider Last Rate Last Admin  . prochlorperazine (COMPAZINE) tablet 10 mg  10 mg Oral Once Derek Jack, MD        ALLERGIES:  No Known Allergies  PHYSICAL EXAM:  Performance status (ECOG): 2 - Symptomatic, <50% confined to bed  Vitals:   08/21/20 1408  BP: (!) 142/52  Pulse: (!) 35  Resp: 18  Temp: 97.7 F (36.5 C)  SpO2: 100%   Wt Readings from Last 3 Encounters:  08/21/20 106 lb 3.2 oz (48.2 kg)  08/15/20 104 lb (47.2 kg)  08/14/20 104 lb (47.2 kg)   Physical Exam Vitals reviewed.  Constitutional:      Appearance: Normal appearance.  Musculoskeletal:     Right lower leg: No edema.     Left lower leg: No edema.  Neurological:     General: No focal deficit present.     Mental Status: She is alert and oriented to person, place, and time.  Psychiatric:        Mood and Affect: Mood normal.        Behavior: Behavior normal.     LABORATORY DATA:  I have reviewed the labs as listed.  CBC Latest Ref Rng & Units 08/21/2020 08/14/2020 08/07/2020  WBC 4.0 - 10.5 K/uL 3.9(L) 2.8(L) 3.5(L)  Hemoglobin 12.0 - 15.0 g/dL 9.5(L) 9.4(L) 9.0(L)  Hematocrit 36 - 46 % 29.7(L) 29.2(L)  27.9(L)  Platelets 150 - 400 K/uL 86(L) 101(L) 112(L)   CMP Latest Ref Rng & Units 08/21/2020 08/14/2020 08/07/2020  Glucose 70 - 99 mg/dL 86 71 86  BUN 8 - 23 mg/dL 18 27(H) 27(H)  Creatinine 0.44 - 1.00 mg/dL 1.09(H) 1.41(H) 1.22(H)  Sodium 135 - 145 mmol/L 144 140 143  Potassium 3.5 - 5.1 mmol/L 3.7 4.3 4.2  Chloride 98 - 111  mmol/L 112(H) 112(H) 114(H)  CO2 22 - 32 mmol/L 24 21(L) 22  Calcium 8.9 - 10.3 mg/dL 8.8(L) 9.0 9.0  Total Protein 6.5 - 8.1 g/dL 5.8(L) 6.1(L) 6.3(L)  Total Bilirubin 0.3 - 1.2 mg/dL 0.9 0.6 0.4  Alkaline Phos 38 - 126 U/L 52 55 59  AST 15 - 41 U/L 37 34 33  ALT 0 - 44 U/L '28 29 27   ' Lab Results  Component Value Date   LDH 160 08/14/2020   LDH 145 07/10/2020   LDH 156 06/12/2020   Lab Results  Component Value Date   TOTALPROTELP 5.5 (L) 08/14/2020   ALBUMINELP 2.9 08/14/2020   A1GS 0.2 08/14/2020   A2GS 0.7 08/14/2020   BETS 0.6 (L) 08/14/2020   GAMS 1.1 08/14/2020   MSPIKE 0.5 (H) 08/14/2020   SPEI Comment 08/14/2020    Lab Results  Component Value Date   KPAFRELGTCHN 30.9 (H) 08/14/2020   LAMBDASER 25.5 08/14/2020   KAPLAMBRATIO 1.21 08/14/2020    DIAGNOSTIC IMAGING:  I have independently reviewed the scans and discussed with the patient. No results found.   ASSESSMENT:  1. IgG lambda multiple myeloma: -She is on Revlimid 10 mg 2 weeks on/2 weeks of along with Velcade 3 weeks on/1 week off. Dexamethasone 10 mg on days of Velcade. -Myeloma panel on 04/17/2020 shows M spike 0.5 g. Kappa light chains are 44. Lambda light chains are 34. Ratio is 1.3.  2. 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   PLAN:  1. IgG lambda multiple myeloma: -She is currently residing at [facility] from 07/19/2020. -Reviewed labs from 08/14/2020 which showed M spike 0.5 g and stable.  Lambda light chains have improved to 25.5 and ratio is 1.21. -Reviewed labs from today which showed creatinine improved to 1.09 with normal  calcium. -She will continue Revlimid 10 mg 2 weeks on 1 week off and Velcade 3 weeks on 1 week off. -I plan to see her back in 8 weeks with repeat myeloma labs.  2. CKD: -Creatinine improved to 1.09.  We have cut back on losartan to 50 mg daily.  3. Weight loss: -She has gained couple of pounds since last visit.  4. Dementia: -Continue Aricept 10 mg daily.  5. Shingles prophylaxis: -Continue acyclovir 400 mg twice daily.  6. Myeloma bone disease: -She was treated with Zometa for several years and was discontinued.  7. Macrocytic anemia: -Hemoglobin today is 9.5 with MCV of 104.  Likely from myelosuppression and CKD.  8.  Diarrhea: -She has diarrhea when she is taking Revlimid as well as on her off weeks. -We will start her on Imodium 2 tablets twice daily as needed.   Orders placed this encounter:  No orders of the defined types were placed in this encounter.    Derek Jack, MD [place] [phone]   I, Milinda Antis, am acting as a scribe for Dr. Sanda Linger.  I, Derek Jack MD, have reviewed the above documentation for accuracy and completeness, and I agree with the above.

## 2020-08-23 DIAGNOSIS — Z1159 Encounter for screening for other viral diseases: Secondary | ICD-10-CM | POA: Diagnosis not present

## 2020-08-23 DIAGNOSIS — C8590 Non-Hodgkin lymphoma, unspecified, unspecified site: Secondary | ICD-10-CM | POA: Diagnosis not present

## 2020-08-24 ENCOUNTER — Encounter: Payer: Self-pay | Admitting: Adult Health

## 2020-08-24 ENCOUNTER — Non-Acute Institutional Stay (SKILLED_NURSING_FACILITY): Payer: Medicare PPO | Admitting: Adult Health

## 2020-08-24 DIAGNOSIS — F0151 Vascular dementia with behavioral disturbance: Secondary | ICD-10-CM

## 2020-08-24 DIAGNOSIS — B009 Herpesviral infection, unspecified: Secondary | ICD-10-CM

## 2020-08-24 DIAGNOSIS — D539 Nutritional anemia, unspecified: Secondary | ICD-10-CM

## 2020-08-24 DIAGNOSIS — F01518 Vascular dementia, unspecified severity, with other behavioral disturbance: Secondary | ICD-10-CM

## 2020-08-24 NOTE — Progress Notes (Signed)
Location:    [place] Room Number: 148/W Place of Service:  SNF (31)   CODE STATUS: Full Code  No Known Allergies  Chief Complaint  Patient presents with  . Medical Management of Chronic Issues            Macrocytic anemia:   Vascular dementia with behavioral disturbance   Herpes:    HPI:  She is a 74 year old long term resident of this facility being seen for the management of her chronic illnesses;  Macrocytic anemia:   Vascular dementia with behavioral disturbance   Herpes. There are no reports of uncontrolled pain. No reports of agitation or anxiety. She has lost one pound and will need her aricept lowered. There no reports of constipation.   Past Medical History:  Diagnosis Date  . Benign hypertension   . Cataract   . Central nervous system lymphoma ([employer])   . Diabetes mellitus without complication ([place])   . H/O partial nephrectomy   . Hypokalemia   . Hypothyroidism   . Impaired cognition   . Multiple myeloma ([facility])    Dr Maylon Peppers, [facility]  . Thyroid disease     Past Surgical History:  Procedure Laterality Date  . ABDOMINAL HYSTERECTOMY    . CRANIOTOMY     for lymphoma  . YAG LASER APPLICATION Right 3/53/6144   Procedure: YAG LASER APPLICATION;  Surgeon: [name], MD;  Location: [facility];  Service: Ophthalmology;  Laterality: Right;    Social History   Socioeconomic History  . Marital status: Single    Spouse name: Not on file  . Number of children: Not on file  . Years of education: Not on file  . Highest education level: Not on file  Occupational History  . Occupation: retired   Tobacco Use  . Smoking status: Never Smoker  . Smokeless tobacco: Never Used  Substance and Sexual Activity  . Alcohol use: No  . Drug use: No  . Sexual activity: Never  Other Topics Concern  . Not on file  Social History Narrative   Long term resident of [facility]    Social Determinants of Health   Financial Resource Strain:   . Difficulty of  Paying Living Expenses: Not on file  Food Insecurity:   . Worried About Charity fundraiser in the Last Year: Not on file  . Ran Out of Food in the Last Year: Not on file  Transportation Needs:   . Lack of Transportation (Medical): Not on file  . Lack of Transportation (Non-Medical): Not on file  Physical Activity:   . Days of Exercise per Week: Not on file  . Minutes of Exercise per Session: Not on file  Stress:   . Feeling of Stress : Not on file  Social Connections:   . Frequency of Communication with Friends and Family: Not on file  . Frequency of Social Gatherings with Friends and Family: Not on file  . Attends Religious Services: Not on file  . Active Member of Clubs or Organizations: Not on file  . Attends Archivist Meetings: Not on file  . Marital Status: Not on file  Intimate Partner Violence:   . Fear of Current or Ex-Partner: Not on file  . Emotionally Abused: Not on file  . Physically Abused: Not on file  . Sexually Abused: Not on file   Family History  Problem Relation Age of Onset  . Depression Mother   . Diabetes Mother   .  Heart disease Father   . Cancer - Prostate Brother   . Cancer Brother   . Cancer Sister       VITAL SIGNS BP (!) 112/56   Pulse (!) 57   Temp (!) 97.4 F (36.3 C) (Oral)   Resp 20   Ht '5\' 9"'  (1.753 m)   Wt 104 lb (47.2 kg)   SpO2 97%   BMI 15.36 kg/m   Facility-Administered Encounter Medications as of 08/24/2020  Medication  . prochlorperazine (COMPAZINE) tablet 10 mg   Outpatient Encounter Medications as of 08/24/2020  Medication Sig  . acetaminophen (TYLENOL) 500 MG tablet Take 1,000 mg by mouth every 6 (six) hours as needed for mild pain.   Marland Kitchen acyclovir (ZOVIRAX) 400 MG tablet TAKE 1 TABLET BY MOUTH TWICE DAILY  . alendronate (FOSAMAX) 70 MG tablet Take 70 mg by mouth once a week. On Monday,  for osteoporosis t score -3.758  . aspirin EC 81 MG tablet Take 81 mg by mouth daily.  . calcium-vitamin D (OSCAL WITH D)  500-200 MG-UNIT tablet Take 1 tablet by mouth daily with breakfast.  . donepezil (ARICEPT) 10 MG tablet TAKE 1 TABLET BY MOUTH every evening  . ferrous sulfate 325 (65 FE) MG tablet Take 325 mg by mouth daily with breakfast.  . lenalidomide (REVLIMID) 10 MG capsule TAKE 1 CAPSULE BY MOUTH  DAILY FOR 14 DAYS, THEN 14  DAYS OFF  . levothyroxine (SYNTHROID, LEVOTHROID) 25 MCG tablet Take 25 mcg by mouth daily before breakfast.  . loperamide (IMODIUM A-D) 2 MG tablet Take 2 mg by mouth 2 (two) times daily as needed for diarrhea or loose stools.  Marland Kitchen losartan (COZAAR) 50 MG tablet Take 1 tablet (50 mg total) by mouth daily.  . Multiple Vitamins-Minerals (MULTIVITAMIN WOMEN 50+ PO) Take by mouth daily.   . NON FORMULARY Regular Diet with Meals  . NON FORMULARY Wanderguard #1335 to ankle for safety awareness. Check placement and function qshift. Every Shift Day, Evening, Night  . Nutritional Supplements (ENSURE CLEAR PO) Take by mouth 2 (two) times daily. Due to variable meal intake, cognitive impairment and low BMI  . potassium chloride SA (KLOR-CON) 20 MEQ tablet Take 20 mEq by mouth daily.      SIGNIFICANT DIAGNOSTIC EXAMS  PREVIOUS   08-10-20: t score: -3.758  NO NEW EXAMS.   LABS REVIEWED PREVIOUS    07-20-20: vit B 12: 620  07-23-20: tsh 2.264 07-24-20: wbc 3.9; hgb 9.3; hct 29.3 mcv 105.4 plt 105; glucose 106; bun 25; creat 1.28; k+ 3.6; na++ 142; a 9.2 liver normal albumin 3.5  07-30-20: chol 116; ldl 49; trig 71; hdl 53; hgb a1c 5.1; hep C neg   TODAY  08-07-20: wbc 3.5; hgb 9.0; hct 27.9; mcv 104.5 plt 112; glucose 86; bun 27; creat 1.22; k+ 4.2; an++ 143; ca 9.0 liver normal albumin 3.2 08-14-20: wbc 2.8; hgb 9.4; hct 29.2; mcv 104.3 plt 101; glucose 71; bun 27; creat 1.41; k+ 4.3; na++ 140; ca 9.0 liver normal albumin 3.1  08-16-20: urine micro-albumin 9.3 08-21-20: wbc 3.9; hgb 9.5; hct 29.7 mcv 104.2 plt 86; glucose 86; bun 18; creat 1.09; k+ 3.7; na++ 144; ca 8.1; liver normal albumin  3.1    Review of Systems  Constitutional: Negative for malaise/fatigue.  Respiratory: Negative for cough and shortness of breath.   Cardiovascular: Negative for chest pain, palpitations and leg swelling.  Gastrointestinal: Negative for abdominal pain, constipation and heartburn.  Musculoskeletal: Negative for back pain, joint pain  and myalgias.  Skin: Negative.   Neurological: Negative for dizziness.  Psychiatric/Behavioral: The patient is not nervous/anxious.     Physical Exam Constitutional:      General: She is not in acute distress.    Appearance: She is well-developed. She is not diaphoretic.  Neck:     Thyroid: No thyromegaly.  Cardiovascular:     Rate and Rhythm: Normal rate and regular rhythm.     Pulses: Normal pulses.     Heart sounds: Murmur heard.      Comments: 2/6 pedal pulses faint Pulmonary:     Effort: Pulmonary effort is normal. No respiratory distress.     Breath sounds: Normal breath sounds.  Abdominal:     General: Bowel sounds are normal. There is no distension.     Palpations: Abdomen is soft.     Tenderness: There is no abdominal tenderness.  Musculoskeletal:        General: Normal range of motion.     Cervical back: Neck supple.     Right lower leg: No edema.     Left lower leg: No edema.  Lymphadenopathy:     Cervical: No cervical adenopathy.  Skin:    General: Skin is warm and dry.  Neurological:     Mental Status: She is alert. Mental status is at baseline.  Psychiatric:        Mood and Affect: Mood normal.       ASSESSMENT/ PLAN:  TODAY  1. Macrocytic anemia: is stable hgb 9.5 vit B 12: 620 will continue iron daily   2. Vascular dementia with behavioral disturbance is stable weight is 104 pounds will lower her aricept 5 mg daily due to her weight loss will monitor her status.   3. Herpes: no recent outbreak will continue acyclovir 400 mg twice daily   PREVIOUS   4. Failure to thrive in adult: is without change weight is 104  pounds; albumin is 3.1 will continue supplements as directed  5. Hypokalemia: is stable k+ 3.7 will continue k+ 20 meq daily   6. Hypothyroidism unspecified type: is stable tsh 2.264 will continue synthroid 25 mcg daily   7. Hypertension associated with type 2 diabetes mellitus: is stable b/p 112/56 will continue cozaar 50 mg daily and asa 81 mg daily   8. Diabetes mellitus without complication: is stable hgb a1c 5.1 is on arb asa   9.Post menopausal osteoporosis: t score -3.758  Will continue  fosamax 70 mg weekly  calcium and vit D   10. Compression fracture of L1 vertebrae with routinely healing subsequent encounter (06/2017) is stable has prn tylenol  11. Thrombocytopenia: is stable plt 86 will monitor   12. Multiple myeloma without achieving remission: is stable is followed by oncology will continue revlimid 100 mg on 14 days off 14 days. Will monitor         Will setup cologuard    MD is aware of resident's narcotic use and is in agreement with current plan of care. We will attempt to wean resident as appropriate.  Ok Edwards NP [facility]  Contact [phone] Monday through Friday 8am- 5pm  After hours call [phone]

## 2020-08-27 DIAGNOSIS — Z1159 Encounter for screening for other viral diseases: Secondary | ICD-10-CM | POA: Diagnosis not present

## 2020-08-27 DIAGNOSIS — C8590 Non-Hodgkin lymphoma, unspecified, unspecified site: Secondary | ICD-10-CM | POA: Diagnosis not present

## 2020-08-30 DIAGNOSIS — C8590 Non-Hodgkin lymphoma, unspecified, unspecified site: Secondary | ICD-10-CM | POA: Diagnosis not present

## 2020-08-30 DIAGNOSIS — Z1159 Encounter for screening for other viral diseases: Secondary | ICD-10-CM | POA: Diagnosis not present

## 2020-08-31 ENCOUNTER — Other Ambulatory Visit (HOSPITAL_COMMUNITY): Payer: Self-pay

## 2020-08-31 DIAGNOSIS — C9000 Multiple myeloma not having achieved remission: Secondary | ICD-10-CM

## 2020-08-31 MED ORDER — LENALIDOMIDE 10 MG PO CAPS: ORAL_CAPSULE | ORAL | 0 refills | Status: DC

## 2020-08-31 NOTE — Telephone Encounter (Signed)
Chart reviewed. Revlimid refilled per Dr. Delton Coombes.

## 2020-09-03 ENCOUNTER — Encounter (HOSPITAL_COMMUNITY)
Admission: RE | Admit: 2020-09-03 | Discharge: 2020-09-03 | Disposition: A | Discharge status: Home / Self Care | Payer: Medicare PPO | Source: Skilled Nursing Facility | Attending: Internal Medicine | Admitting: Internal Medicine

## 2020-09-03 DIAGNOSIS — C8590 Non-Hodgkin lymphoma, unspecified, unspecified site: Secondary | ICD-10-CM | POA: Diagnosis not present

## 2020-09-03 DIAGNOSIS — Z79899 Other long term (current) drug therapy: Secondary | ICD-10-CM | POA: Insufficient documentation

## 2020-09-03 DIAGNOSIS — E119 Type 2 diabetes mellitus without complications: Secondary | ICD-10-CM | POA: Insufficient documentation

## 2020-09-03 DIAGNOSIS — Z1159 Encounter for screening for other viral diseases: Secondary | ICD-10-CM | POA: Diagnosis not present

## 2020-09-03 DIAGNOSIS — E039 Hypothyroidism, unspecified: Secondary | ICD-10-CM | POA: Insufficient documentation

## 2020-09-03 LAB — VITAMIN D 25 HYDROXY (VIT D DEFICIENCY, FRACTURES): Vit D, 25-Hydroxy: 59.99 ng/mL (ref 30–100)

## 2020-09-04 ENCOUNTER — Inpatient Hospital Stay (HOSPITAL_COMMUNITY): Payer: Medicare PPO

## 2020-09-04 ENCOUNTER — Other Ambulatory Visit: Payer: Self-pay

## 2020-09-04 ENCOUNTER — Encounter (HOSPITAL_COMMUNITY): Payer: Self-pay

## 2020-09-04 VITALS — BP 110/49 | HR 32 | Temp 96.9°F | Resp 18 | Wt 110.4 lb

## 2020-09-04 DIAGNOSIS — C9000 Multiple myeloma not having achieved remission: Secondary | ICD-10-CM

## 2020-09-04 DIAGNOSIS — Z5111 Encounter for antineoplastic chemotherapy: Secondary | ICD-10-CM | POA: Diagnosis not present

## 2020-09-04 LAB — CBC WITH DIFFERENTIAL/PLATELET
Abs Immature Granulocytes: 0 10*3/uL (ref 0.00–0.07)
Basophils Absolute: 0.1 10*3/uL (ref 0.0–0.1)
Basophils Relative: 2 %
Eosinophils Absolute: 0.2 10*3/uL (ref 0.0–0.5)
Eosinophils Relative: 6 %
HCT: 28.4 % — ABNORMAL LOW (ref 36.0–46.0)
Hemoglobin: 9 g/dL — ABNORMAL LOW (ref 12.0–15.0)
Immature Granulocytes: 0 %
Lymphocytes Relative: 22 %
Lymphs Abs: 0.6 10*3/uL — ABNORMAL LOW (ref 0.7–4.0)
MCH: 33.6 pg (ref 26.0–34.0)
MCHC: 31.7 g/dL (ref 30.0–36.0)
MCV: 106 fL — ABNORMAL HIGH (ref 80.0–100.0)
Monocytes Absolute: 0.5 10*3/uL (ref 0.1–1.0)
Monocytes Relative: 16 %
Neutro Abs: 1.7 10*3/uL (ref 1.7–7.7)
Neutrophils Relative %: 54 %
Platelets: 113 10*3/uL — ABNORMAL LOW (ref 150–400)
RBC: 2.68 MIL/uL — ABNORMAL LOW (ref 3.87–5.11)
RDW: 16.2 % — ABNORMAL HIGH (ref 11.5–15.5)
WBC: 3 10*3/uL — ABNORMAL LOW (ref 4.0–10.5)
nRBC: 0 % (ref 0.0–0.2)

## 2020-09-04 LAB — COMPREHENSIVE METABOLIC PANEL
ALT: 24 U/L (ref 0–44)
AST: 31 U/L (ref 15–41)
Albumin: 2.9 g/dL — ABNORMAL LOW (ref 3.5–5.0)
Alkaline Phosphatase: 64 U/L (ref 38–126)
Anion gap: 7 (ref 5–15)
BUN: 17 mg/dL (ref 8–23)
CO2: 24 mmol/L (ref 22–32)
Calcium: 8.8 mg/dL — ABNORMAL LOW (ref 8.9–10.3)
Chloride: 112 mmol/L — ABNORMAL HIGH (ref 98–111)
Creatinine, Ser: 1.38 mg/dL — ABNORMAL HIGH (ref 0.44–1.00)
GFR calc Af Amer: 43 mL/min — ABNORMAL LOW (ref >60)
GFR calc non Af Amer: 37 mL/min — ABNORMAL LOW (ref >60)
Glucose, Bld: 84 mg/dL (ref 70–99)
Potassium: 3.8 mmol/L (ref 3.5–5.1)
Sodium: 143 mmol/L (ref 135–145)
Total Bilirubin: 0.7 mg/dL (ref 0.3–1.2)
Total Protein: 5.9 g/dL — ABNORMAL LOW (ref 6.5–8.1)

## 2020-09-04 LAB — LACTATE DEHYDROGENASE: LDH: 183 U/L (ref 98–192)

## 2020-09-04 MED ORDER — BORTEZOMIB CHEMO SQ INJECTION 3.5 MG (2.5MG/ML)
1.3000 mg/m2 | Freq: Once | INTRAMUSCULAR | Status: AC
  Administered 2020-09-04: 2 mg via SUBCUTANEOUS
  Filled 2020-09-04: qty 0.8

## 2020-09-04 MED ORDER — DEXAMETHASONE 4 MG PO TABS
10.0000 mg | ORAL_TABLET | Freq: Once | ORAL | Status: AC
  Administered 2020-09-04: 10 mg via ORAL
  Filled 2020-09-04: qty 3

## 2020-09-04 NOTE — Progress Notes (Signed)
Amanda Wu tolerated Velcade injection well without complaints or incident. Labs reviewed prior to administering this medication. VSS Pt continues to take Revlimid as prescribed without issues. Pt discharged via wheelchair in satisfactory condition

## 2020-09-04 NOTE — Patient Instructions (Signed)
[facility] Discharge Instructions for Patients Receiving Chemotherapy   Beginning January 23rd 2017 lab work for the Cancer Center will be done in the  Main lab at [facility] on 1st floor. If you have a lab appointment with the Cancer Center please come in thru the  Main Entrance and check in at the main information desk   Today you received the following chemotherapy agents Velcade injection. Follow-up as scheduled  To help prevent nausea and vomiting after your treatment, we encourage you to take your nausea medication   If you develop nausea and vomiting, or diarrhea that is not controlled by your medication, call the clinic.  The clinic phone number is [phone]. Office hours are Monday-Friday 8:30am-5:00pm.  BELOW ARE SYMPTOMS THAT SHOULD BE REPORTED IMMEDIATELY:  *FEVER GREATER THAN 101.0 F  *CHILLS WITH OR WITHOUT FEVER  NAUSEA AND VOMITING THAT IS NOT CONTROLLED WITH YOUR NAUSEA MEDICATION  *UNUSUAL SHORTNESS OF BREATH  *UNUSUAL BRUISING OR BLEEDING  TENDERNESS IN MOUTH AND THROAT WITH OR WITHOUT PRESENCE OF ULCERS  *URINARY PROBLEMS  *BOWEL PROBLEMS  UNUSUAL RASH Items with * indicate a potential emergency and should be followed up as soon as possible. If you have an emergency after office hours please contact your primary care physician or go to the nearest emergency department.  Please call the clinic during office hours if you have any questions or concerns.   You may also contact the Patient Navigator at [phone] should you have any questions or need assistance in obtaining follow up care.      Resources For Cancer Patients and their Caregivers ? American Cancer Society: Can assist with transportation, wigs, general needs, runs Look Good Feel Better.        1-888-227-6333 ? Cancer Care: Provides financial assistance, online support groups, medication/co-pay assistance.  1-800-813-HOPE (4673) ? [facility] Assists [place] cancer patients and their families through emotional , educational and financial support.  336-427-4357 ? [place] DSS Where to apply for food stamps, Medicaid and utility assistance. 336-342-1394 ? [facility]: Transportation to medical appointments. 336-347-2287 ? Social Security Administration: May apply for disability if have a Stage IV cancer. 336-342-7796 1-800-772-1213 ? [place] Aging, Disability and Transit Services: Assists with nutrition, care and transit needs. 336-349-2343         

## 2020-09-05 LAB — PROTEIN ELECTROPHORESIS, SERUM
A/G Ratio: 1.1 (ref 0.7–1.7)
Albumin ELP: 2.9 g/dL (ref 2.9–4.4)
Alpha-1-Globulin: 0.2 g/dL (ref 0.0–0.4)
Alpha-2-Globulin: 0.6 g/dL (ref 0.4–1.0)
Beta Globulin: 0.6 g/dL — ABNORMAL LOW (ref 0.7–1.3)
Gamma Globulin: 1.1 g/dL (ref 0.4–1.8)
Globulin, Total: 2.6 g/dL (ref 2.2–3.9)
M-Spike, %: 0.5 g/dL — ABNORMAL HIGH
Total Protein ELP: 5.5 g/dL — ABNORMAL LOW (ref 6.0–8.5)

## 2020-09-05 LAB — KAPPA/LAMBDA LIGHT CHAINS
Kappa free light chain: 36.7 mg/L — ABNORMAL HIGH (ref 3.3–19.4)
Kappa, lambda light chain ratio: 1.33 (ref 0.26–1.65)
Lambda free light chains: 27.6 mg/L — ABNORMAL HIGH (ref 5.7–26.3)

## 2020-09-06 ENCOUNTER — Encounter: Payer: Self-pay | Admitting: Adult Health

## 2020-09-06 ENCOUNTER — Non-Acute Institutional Stay (SKILLED_NURSING_FACILITY): Payer: Medicare PPO | Admitting: Adult Health

## 2020-09-06 DIAGNOSIS — F0151 Vascular dementia with behavioral disturbance: Secondary | ICD-10-CM | POA: Diagnosis not present

## 2020-09-06 DIAGNOSIS — R197 Diarrhea, unspecified: Secondary | ICD-10-CM | POA: Diagnosis not present

## 2020-09-06 DIAGNOSIS — F01518 Vascular dementia, unspecified severity, with other behavioral disturbance: Secondary | ICD-10-CM

## 2020-09-06 NOTE — Progress Notes (Signed)
Location:    [facility] Room Number: 148/W Place of Service:  SNF (31)   CODE STATUS: Full Code  No Known Allergies  Chief Complaint  Patient presents with  . Acute Visit    Diarrhea    HPI:  Staff reports that she is having 2-3 diarrheal stools daily for the past several days. There is no association with different food. There is no pattern present. No abdominal pain; no nausea or vomiting.    Past Medical History:  Diagnosis Date  . Benign hypertension   . Cataract   . 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 Diabetes mellitus without complication ([place])   . H/O partial nephrectomy   . Hypokalemia   . Hypothyroidism   . Impaired cognition   . Multiple myeloma (HCC)    Dr Maylon Peppers, [facility]  . Thyroid disease     Past Surgical History:  Procedure Laterality Date  . ABDOMINAL HYSTERECTOMY    . CRANIOTOMY     for lymphoma  . YAG LASER APPLICATION Right 06/22/6282   Procedure: YAG LASER APPLICATION;  Surgeon: [name], MD;  Location: [facility];  Service: Ophthalmology;  Laterality: Right;    Social History   Socioeconomic History  . Marital status: Single    Spouse name: Not on file  . Number of children: Not on file  . Years of education: Not on file  . Highest education level: Not on file  Occupational History  . Occupation: retired   Tobacco Use  . Smoking status: Never Smoker  . Smokeless tobacco: Never Used  Substance and Sexual Activity  . Alcohol use: No  . Drug use: No  . Sexual activity: Never  Other Topics Concern  . Not on file  Social History Narrative   Long term resident of [facility]    Social Determinants of Health   Financial Resource Strain:   . Difficulty of Paying Living Expenses: Not on file  Food Insecurity:   . Worried About Charity fundraiser in the Last Year: Not on file  . Ran Out of Food in the Last Year: Not on file  Transportation Needs:   . Lack of Transportation (Medical): Not on file  . Lack of  Transportation (Non-Medical): Not on file  Physical Activity:   . Days of Exercise per Week: Not on file  . Minutes of Exercise per Session: Not on file  Stress:   . Feeling of Stress : Not on file  Social Connections:   . Frequency of Communication with Friends and Family: Not on file  . Frequency of Social Gatherings with Friends and Family: Not on file  . Attends Religious Services: Not on file  . Active Member of Clubs or Organizations: Not on file  . Attends Archivist Meetings: Not on file  . Marital Status: Not on file  Intimate Partner Violence:   . Fear of Current or Ex-Partner: Not on file  . Emotionally Abused: Not on file  . Physically Abused: Not on file  . Sexually Abused: Not on file   Family History  Problem Relation Age of Onset  . Depression Mother   . Diabetes Mother   . Heart disease Father   . Cancer - Prostate Brother   . Cancer Brother   . Cancer Sister       VITAL SIGNS BP 119/62   Pulse (!) 58   Temp 98.4 F (36.9 C)   Resp 20   Ht  '5\' 9"'  (1.753 m)   Wt 106 lb (48.1 kg)   SpO2 97%   BMI 15.65 kg/m   Facility-Administered Encounter Medications as of 09/06/2020  Medication  . prochlorperazine (COMPAZINE) tablet 10 mg   Outpatient Encounter Medications as of 09/06/2020  Medication Sig  . acetaminophen (TYLENOL) 500 MG tablet Take 1,000 mg by mouth every 6 (six) hours as needed for mild pain.   Marland Kitchen acyclovir (ZOVIRAX) 400 MG tablet TAKE 1 TABLET BY MOUTH TWICE DAILY  . aspirin EC 81 MG tablet Take 81 mg by mouth daily.  . calcium-vitamin D (OSCAL WITH D) 500-200 MG-UNIT tablet Take 1 tablet by mouth daily with breakfast.  . donepezil (ARICEPT) 10 MG tablet TAKE 1 TABLET BY MOUTH every evening  . ferrous sulfate 325 (65 FE) MG tablet Take 325 mg by mouth daily with breakfast.  . lenalidomide (REVLIMID) 10 MG capsule TAKE 1 CAPSULE BY MOUTH  DAILY FOR 14 DAYS, THEN 14  DAYS OFF  . levothyroxine (SYNTHROID, LEVOTHROID) 25 MCG tablet Take  25 mcg by mouth daily before breakfast.  . loperamide (IMODIUM A-D) 2 MG tablet Take 2 mg by mouth 2 (two) times daily as needed for diarrhea or loose stools.  Marland Kitchen losartan (COZAAR) 50 MG tablet Take 1 tablet (50 mg total) by mouth daily.  . Multiple Vitamins-Minerals (MULTIVITAMIN WOMEN 50+ PO) Take by mouth daily.   . NON FORMULARY Regular Diet with Meals  . NON FORMULARY Wanderguard #1335 to ankle for safety awareness. Check placement and function qshift. Every Shift Day, Evening, Night  . Nutritional Supplements (ENSURE CLEAR PO) Take by mouth 2 (two) times daily. Due to variable meal intake, cognitive impairment and low BMI  . potassium chloride SA (KLOR-CON) 20 MEQ tablet Take 20 mEq by mouth daily.   . [DISCONTINUED] alendronate (FOSAMAX) 70 MG tablet Take 70 mg by mouth once a week. On Monday,  for osteoporosis t score -3.758     SIGNIFICANT DIAGNOSTIC EXAMS   PREVIOUS   08-10-20: t score: -3.758  NO NEW EXAMS.   LABS REVIEWED PREVIOUS    07-20-20: vit B 12: 620  07-23-20: tsh 2.264 07-24-20: wbc 3.9; hgb 9.3; hct 29.3 mcv 105.4 plt 105; glucose 106; bun 25; creat 1.28; k+ 3.6; na++ 142; a 9.2 liver normal albumin 3.5  07-30-20: chol 116; ldl 49; trig 71; hdl 53; hgb a1c 5.1; hep C neg  08-07-20: wbc 3.5; hgb 9.0; hct 27.9; mcv 104.5 plt 112; glucose 86; bun 27; creat 1.22; k+ 4.2; an++ 143; ca 9.0 liver normal albumin 3.2 08-14-20: wbc 2.8; hgb 9.4; hct 29.2; mcv 104.3 plt 101; glucose 71; bun 27; creat 1.41; k+ 4.3; na++ 140; ca 9.0 liver normal albumin 3.1  08-16-20: urine micro-albumin 9.3 08-21-20: wbc 3.9; hgb 9.5; hct 29.7 mcv 104.2 plt 86; glucose 86; bun 18; creat 1.09; k+ 3.7; na++ 144; ca 8.1; liver normal albumin 3.1   NO NEW LABS.   Review of Systems  Constitutional: Negative for malaise/fatigue.  Respiratory: Negative for cough and shortness of breath.   Cardiovascular: Negative for chest pain, palpitations and leg swelling.  Gastrointestinal: Positive for diarrhea.  Negative for abdominal pain, heartburn, nausea and vomiting.  Musculoskeletal: Negative for back pain, joint pain and myalgias.  Skin: Negative.   Neurological: Negative for dizziness.  Psychiatric/Behavioral: The patient is not nervous/anxious.     Physical Exam Constitutional:      General: She is not in acute distress.    Appearance: She is underweight. She  is not diaphoretic.  Neck:     Thyroid: No thyromegaly.  Cardiovascular:     Rate and Rhythm: Normal rate and regular rhythm.     Heart sounds: Normal heart sounds.     Comments: 2/6 pedal pulses faint Pulmonary:     Effort: Pulmonary effort is normal. No respiratory distress.     Breath sounds: Normal breath sounds.  Abdominal:     General: Bowel sounds are normal. There is no distension.     Palpations: Abdomen is soft.     Tenderness: There is no abdominal tenderness.  Musculoskeletal:        General: Normal range of motion.     Cervical back: Neck supple.     Right lower leg: No edema.     Left lower leg: No edema.  Lymphadenopathy:     Cervical: No cervical adenopathy.  Skin:    General: Skin is warm and dry.  Neurological:     Mental Status: She is alert. Mental status is at baseline.  Psychiatric:        Mood and Affect: Mood normal.       ASSESSMENT/ PLAN:  TODAY  1. Vascular dementia with behavioral disturbance 2. Diarrhea  Due to her low body weight and her diarrhea will stop the aricept at this time; this medication is more than likely causing her problem. Will monitor   MD is aware of resident's narcotic use and is in agreement with current plan of care. We will attempt to wean resident as appropriate.  Ok Edwards NP [facility]  Contact [phone] Monday through Friday 8am- 5pm  After hours call [phone]

## 2020-09-10 DIAGNOSIS — R197 Diarrhea, unspecified: Secondary | ICD-10-CM | POA: Insufficient documentation

## 2020-09-11 ENCOUNTER — Inpatient Hospital Stay (HOSPITAL_COMMUNITY): Payer: Medicare PPO

## 2020-09-11 ENCOUNTER — Encounter (HOSPITAL_COMMUNITY): Payer: Self-pay

## 2020-09-11 VITALS — BP 131/74 | HR 69 | Temp 97.1°F | Resp 16 | Wt 107.8 lb

## 2020-09-11 DIAGNOSIS — C9000 Multiple myeloma not having achieved remission: Secondary | ICD-10-CM

## 2020-09-11 DIAGNOSIS — Z5111 Encounter for antineoplastic chemotherapy: Secondary | ICD-10-CM | POA: Diagnosis not present

## 2020-09-11 LAB — CBC WITH DIFFERENTIAL/PLATELET
Abs Immature Granulocytes: 0 10*3/uL (ref 0.00–0.07)
Basophils Absolute: 0 10*3/uL (ref 0.0–0.1)
Basophils Relative: 0 %
Eosinophils Absolute: 0 10*3/uL (ref 0.0–0.5)
Eosinophils Relative: 0 %
HCT: 29.2 % — ABNORMAL LOW (ref 36.0–46.0)
Hemoglobin: 9.3 g/dL — ABNORMAL LOW (ref 12.0–15.0)
Immature Granulocytes: 0 %
Lymphocytes Relative: 16 %
Lymphs Abs: 0.4 10*3/uL — ABNORMAL LOW (ref 0.7–4.0)
MCH: 33.7 pg (ref 26.0–34.0)
MCHC: 31.8 g/dL (ref 30.0–36.0)
MCV: 105.8 fL — ABNORMAL HIGH (ref 80.0–100.0)
Monocytes Absolute: 0.6 10*3/uL (ref 0.1–1.0)
Monocytes Relative: 22 %
Neutro Abs: 1.6 10*3/uL — ABNORMAL LOW (ref 1.7–7.7)
Neutrophils Relative %: 62 %
Platelets: 88 10*3/uL — ABNORMAL LOW (ref 150–400)
RBC: 2.76 MIL/uL — ABNORMAL LOW (ref 3.87–5.11)
RDW: 16.6 % — ABNORMAL HIGH (ref 11.5–15.5)
WBC: 2.7 10*3/uL — ABNORMAL LOW (ref 4.0–10.5)
nRBC: 0 % (ref 0.0–0.2)

## 2020-09-11 LAB — COMPREHENSIVE METABOLIC PANEL
ALT: 19 U/L (ref 0–44)
AST: 28 U/L (ref 15–41)
Albumin: 3.1 g/dL — ABNORMAL LOW (ref 3.5–5.0)
Alkaline Phosphatase: 56 U/L (ref 38–126)
Anion gap: 7 (ref 5–15)
BUN: 19 mg/dL (ref 8–23)
CO2: 24 mmol/L (ref 22–32)
Calcium: 8.6 mg/dL — ABNORMAL LOW (ref 8.9–10.3)
Chloride: 109 mmol/L (ref 98–111)
Creatinine, Ser: 1.31 mg/dL — ABNORMAL HIGH (ref 0.44–1.00)
GFR calc Af Amer: 46 mL/min — ABNORMAL LOW (ref >60)
GFR calc non Af Amer: 39 mL/min — ABNORMAL LOW (ref >60)
Glucose, Bld: 75 mg/dL (ref 70–99)
Potassium: 3.6 mmol/L (ref 3.5–5.1)
Sodium: 140 mmol/L (ref 135–145)
Total Bilirubin: 0.8 mg/dL (ref 0.3–1.2)
Total Protein: 6.1 g/dL — ABNORMAL LOW (ref 6.5–8.1)

## 2020-09-11 MED ORDER — DEXAMETHASONE 4 MG PO TABS
10.0000 mg | ORAL_TABLET | Freq: Once | ORAL | Status: AC
  Administered 2020-09-11: 10 mg via ORAL

## 2020-09-11 MED ORDER — BORTEZOMIB CHEMO SQ INJECTION 3.5 MG (2.5MG/ML)
1.3000 mg/m2 | Freq: Once | INTRAMUSCULAR | Status: AC
  Administered 2020-09-11: 2 mg via SUBCUTANEOUS
  Filled 2020-09-11: qty 0.8

## 2020-09-11 MED ORDER — DEXAMETHASONE 4 MG PO TABS
ORAL_TABLET | ORAL | Status: AC
  Filled 2020-09-11: qty 1

## 2020-09-11 MED ORDER — DEXAMETHASONE 4 MG PO TABS
ORAL_TABLET | ORAL | Status: AC
  Filled 2020-09-11: qty 3

## 2020-09-11 NOTE — Progress Notes (Signed)
Labs and VS reviewed with Dr. Delton Coombes.  Okay to proceed with Velcade injection today per MD.   Velcade administered as ordered - see MAR for details.  Discharged via wheelchair in stable condition in [place] staff.

## 2020-09-12 MED ORDER — OCTREOTIDE ACETATE 30 MG IM KIT
PACK | INTRAMUSCULAR | Status: AC
  Filled 2020-09-12: qty 1

## 2020-09-13 MED ORDER — PALONOSETRON HCL INJECTION 0.25 MG/5ML
INTRAVENOUS | Status: AC
  Filled 2020-09-13: qty 5

## 2020-09-13 MED ORDER — CYANOCOBALAMIN 1000 MCG/ML IJ SOLN
INTRAMUSCULAR | Status: AC
  Filled 2020-09-13: qty 1

## 2020-09-14 MED ORDER — PEGFILGRASTIM-JMDB 6 MG/0.6ML ~~LOC~~ SOSY
PREFILLED_SYRINGE | SUBCUTANEOUS | Status: AC
  Filled 2020-09-14: qty 0.6

## 2020-09-14 MED ORDER — PEGFILGRASTIM-CBQV 6 MG/0.6ML ~~LOC~~ SOSY
PREFILLED_SYRINGE | SUBCUTANEOUS | Status: AC
  Filled 2020-09-14: qty 0.6

## 2020-09-18 ENCOUNTER — Inpatient Hospital Stay (HOSPITAL_COMMUNITY): Payer: Medicare PPO

## 2020-09-18 ENCOUNTER — Encounter (HOSPITAL_COMMUNITY): Payer: Self-pay

## 2020-09-18 ENCOUNTER — Inpatient Hospital Stay (HOSPITAL_COMMUNITY): Payer: Medicare PPO | Attending: Hematology

## 2020-09-18 VITALS — BP 112/67 | HR 72 | Temp 97.0°F | Resp 16 | Wt 108.2 lb

## 2020-09-18 DIAGNOSIS — C9000 Multiple myeloma not having achieved remission: Secondary | ICD-10-CM

## 2020-09-18 DIAGNOSIS — Z5111 Encounter for antineoplastic chemotherapy: Secondary | ICD-10-CM | POA: Diagnosis not present

## 2020-09-18 LAB — COMPREHENSIVE METABOLIC PANEL
ALT: 25 U/L (ref 0–44)
AST: 33 U/L (ref 15–41)
Albumin: 3.2 g/dL — ABNORMAL LOW (ref 3.5–5.0)
Alkaline Phosphatase: 63 U/L (ref 38–126)
Anion gap: 7 (ref 5–15)
BUN: 20 mg/dL (ref 8–23)
CO2: 25 mmol/L (ref 22–32)
Calcium: 8.8 mg/dL — ABNORMAL LOW (ref 8.9–10.3)
Chloride: 110 mmol/L (ref 98–111)
Creatinine, Ser: 1.12 mg/dL — ABNORMAL HIGH (ref 0.44–1.00)
GFR calc non Af Amer: 48 mL/min — ABNORMAL LOW (ref >60)
Glucose, Bld: 99 mg/dL (ref 70–99)
Potassium: 3.2 mmol/L — ABNORMAL LOW (ref 3.5–5.1)
Sodium: 142 mmol/L (ref 135–145)
Total Bilirubin: 0.6 mg/dL (ref 0.3–1.2)
Total Protein: 5.9 g/dL — ABNORMAL LOW (ref 6.5–8.1)

## 2020-09-18 LAB — CBC WITH DIFFERENTIAL/PLATELET
Abs Immature Granulocytes: 0.01 10*3/uL (ref 0.00–0.07)
Basophils Absolute: 0 10*3/uL (ref 0.0–0.1)
Basophils Relative: 1 %
Eosinophils Absolute: 0.2 10*3/uL (ref 0.0–0.5)
Eosinophils Relative: 4 %
HCT: 29.7 % — ABNORMAL LOW (ref 36.0–46.0)
Hemoglobin: 9.6 g/dL — ABNORMAL LOW (ref 12.0–15.0)
Immature Granulocytes: 0 %
Lymphocytes Relative: 15 %
Lymphs Abs: 0.7 10*3/uL (ref 0.7–4.0)
MCH: 33.1 pg (ref 26.0–34.0)
MCHC: 32.3 g/dL (ref 30.0–36.0)
MCV: 102.4 fL — ABNORMAL HIGH (ref 80.0–100.0)
Monocytes Absolute: 0.4 10*3/uL (ref 0.1–1.0)
Monocytes Relative: 9 %
Neutro Abs: 3.1 10*3/uL (ref 1.7–7.7)
Neutrophils Relative %: 71 %
Platelets: 105 10*3/uL — ABNORMAL LOW (ref 150–400)
RBC: 2.9 MIL/uL — ABNORMAL LOW (ref 3.87–5.11)
RDW: 15.8 % — ABNORMAL HIGH (ref 11.5–15.5)
WBC: 4.4 10*3/uL (ref 4.0–10.5)
nRBC: 0 % (ref 0.0–0.2)

## 2020-09-18 MED ORDER — DEXAMETHASONE 4 MG PO TABS
10.0000 mg | ORAL_TABLET | Freq: Once | ORAL | Status: AC
  Administered 2020-09-18: 10 mg via ORAL
  Filled 2020-09-18: qty 3

## 2020-09-18 MED ORDER — BORTEZOMIB CHEMO SQ INJECTION 3.5 MG (2.5MG/ML)
1.3000 mg/m2 | Freq: Once | INTRAMUSCULAR | Status: AC
  Administered 2020-09-18: 2 mg via SUBCUTANEOUS
  Filled 2020-09-18: qty 0.8

## 2020-09-18 NOTE — Patient Instructions (Signed)
[facility] Discharge Instructions for Patients Receiving Chemotherapy   Beginning January 23rd 2017 lab work for the Cancer Center will be done in the  Main lab at [facility] on 1st floor. If you have a lab appointment with the Cancer Center please come in thru the  Main Entrance and check in at the main information desk   Today you received the following chemotherapy agents Velcade injection. Follow-up as scheduled  To help prevent nausea and vomiting after your treatment, we encourage you to take your nausea medication   If you develop nausea and vomiting, or diarrhea that is not controlled by your medication, call the clinic.  The clinic phone number is [phone]. Office hours are Monday-Friday 8:30am-5:00pm.  BELOW ARE SYMPTOMS THAT SHOULD BE REPORTED IMMEDIATELY:  *FEVER GREATER THAN 101.0 F  *CHILLS WITH OR WITHOUT FEVER  NAUSEA AND VOMITING THAT IS NOT CONTROLLED WITH YOUR NAUSEA MEDICATION  *UNUSUAL SHORTNESS OF BREATH  *UNUSUAL BRUISING OR BLEEDING  TENDERNESS IN MOUTH AND THROAT WITH OR WITHOUT PRESENCE OF ULCERS  *URINARY PROBLEMS  *BOWEL PROBLEMS  UNUSUAL RASH Items with * indicate a potential emergency and should be followed up as soon as possible. If you have an emergency after office hours please contact your primary care physician or go to the nearest emergency department.  Please call the clinic during office hours if you have any questions or concerns.   You may also contact the Patient Navigator at [phone] should you have any questions or need assistance in obtaining follow up care.      Resources For Cancer Patients and their Caregivers ? American Cancer Society: Can assist with transportation, wigs, general needs, runs Look Good Feel Better.        1-888-227-6333 ? Cancer Care: Provides financial assistance, online support groups, medication/co-pay assistance.  1-800-813-HOPE (4673) ? [facility] Assists [place] cancer patients and their families through emotional , educational and financial support.  336-427-4357 ? [place] DSS Where to apply for food stamps, Medicaid and utility assistance. 336-342-1394 ? [facility]: Transportation to medical appointments. 336-347-2287 ? Social Security Administration: May apply for disability if have a Stage IV cancer. 336-342-7796 1-800-772-1213 ? [place] Aging, Disability and Transit Services: Assists with nutrition, care and transit needs. 336-349-2343         

## 2020-09-18 NOTE — Progress Notes (Signed)
[place] reviewed with Dr. Delton Coombes and pt approved for Velcade injection today per MD                                          Amanda Wu tolerated Velcade injection well without complaints or incident. VSS Pt discharged via wheelchair in satisfactory condition

## 2020-09-19 MED ORDER — DIPHENHYDRAMINE HCL 25 MG PO CAPS
ORAL_CAPSULE | ORAL | Status: AC
  Filled 2020-09-19: qty 1

## 2020-09-19 MED ORDER — ACETAMINOPHEN 325 MG PO TABS
ORAL_TABLET | ORAL | Status: AC
  Filled 2020-09-19: qty 2

## 2020-09-20 MED ORDER — DENOSUMAB 120 MG/1.7ML ~~LOC~~ SOLN
SUBCUTANEOUS | Status: AC
  Filled 2020-09-20: qty 1.7

## 2020-09-28 ENCOUNTER — Encounter: Payer: Self-pay | Admitting: Adult Health

## 2020-09-28 ENCOUNTER — Other Ambulatory Visit (HOSPITAL_COMMUNITY): Payer: Self-pay | Admitting: Hematology

## 2020-09-28 ENCOUNTER — Non-Acute Institutional Stay (SKILLED_NURSING_FACILITY): Payer: Medicare PPO | Admitting: Adult Health

## 2020-09-28 DIAGNOSIS — E039 Hypothyroidism, unspecified: Secondary | ICD-10-CM | POA: Diagnosis not present

## 2020-09-28 DIAGNOSIS — E876 Hypokalemia: Secondary | ICD-10-CM | POA: Diagnosis not present

## 2020-09-28 DIAGNOSIS — R627 Adult failure to thrive: Secondary | ICD-10-CM | POA: Diagnosis not present

## 2020-09-28 DIAGNOSIS — C9000 Multiple myeloma not having achieved remission: Secondary | ICD-10-CM

## 2020-09-28 NOTE — Telephone Encounter (Signed)
Chart reviewed. Revlimid refilled per Dr. Delton Coombes.

## 2020-09-28 NOTE — Progress Notes (Signed)
Location:    [facility] Room Number: 156/W Place of Service:  SNF (31)   CODE STATUS: Full Code  No Known Allergies  Chief Complaint  Patient presents with  . Medical Management of Chronic Issues            Failure to thrive in adult    Hypokalemia: Hypothyroidism  Unspecified type    HPI:  She is a 74 year old long term resident of this facility being seen for the management of her chrnic illnesses: Failure to thrive in adult    Hypokalemia: Hypothyroidism  Unspecified type. There are no reports of uncontrolled pain. No reports of changes in appetite; her diarrhea has resolved. There are no reports of agitation or anxiety.   Past Medical History:  Diagnosis Date  . Benign hypertension   . Cataract   . 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 Diabetes mellitus without complication ([place])   . H/O partial nephrectomy   . Hypokalemia   . Hypothyroidism   . Impaired cognition   . Multiple myeloma (HCC)    Dr Maylon Peppers, [facility]  . Thyroid disease     Past Surgical History:  Procedure Laterality Date  . ABDOMINAL HYSTERECTOMY    . CRANIOTOMY     for lymphoma  . YAG LASER APPLICATION Right 2/33/0076   Procedure: YAG LASER APPLICATION;  Surgeon: [name], MD;  Location: [facility];  Service: Ophthalmology;  Laterality: Right;    Social History   Socioeconomic History  . Marital status: Single    Spouse name: Not on file  . Number of children: Not on file  . Years of education: Not on file  . Highest education level: Not on file  Occupational History  . Occupation: retired   Tobacco Use  . Smoking status: Never Smoker  . Smokeless tobacco: Never Used  Substance and Sexual Activity  . Alcohol use: No  . Drug use: No  . Sexual activity: Never  Other Topics Concern  . Not on file  Social History Narrative   Long term resident of [facility]    Social Determinants of Health   Financial Resource Strain:   . Difficulty of Paying Living Expenses: Not  on file  Food Insecurity:   . Worried About Charity fundraiser in the Last Year: Not on file  . Ran Out of Food in the Last Year: Not on file  Transportation Needs:   . Lack of Transportation (Medical): Not on file  . Lack of Transportation (Non-Medical): Not on file  Physical Activity:   . Days of Exercise per Week: Not on file  . Minutes of Exercise per Session: Not on file  Stress:   . Feeling of Stress : Not on file  Social Connections:   . Frequency of Communication with Friends and Family: Not on file  . Frequency of Social Gatherings with Friends and Family: Not on file  . Attends Religious Services: Not on file  . Active Member of Clubs or Organizations: Not on file  . Attends Archivist Meetings: Not on file  . Marital Status: Not on file  Intimate Partner Violence:   . Fear of Current or Ex-Partner: Not on file  . Emotionally Abused: Not on file  . Physically Abused: Not on file  . Sexually Abused: Not on file   Family History  Problem Relation Age of Onset  . Depression Mother   . Diabetes Mother   . Heart disease  Father   . Cancer - Prostate Brother   . Cancer Brother   . Cancer Sister       VITAL SIGNS BP 108/69   Pulse 76   Temp (!) 97.3 F (36.3 C)   Resp 20   Ht _0  (1.753 m)   Wt 107 lb 6.4 oz (48.7 kg)   SpO2 97%   BMI 15.86 kg/m   Facility-Administered Encounter Medications as of 09/28/2020  Medication  . prochlorperazine (COMPAZINE) tablet 10 mg   Outpatient Encounter Medications as of 09/28/2020  Medication Sig  . acetaminophen (TYLENOL) 500 MG tablet Take 1,000 mg by mouth every 6 (six) hours as needed for mild pain.   Marland Kitchen acyclovir (ZOVIRAX) 400 MG tablet TAKE 1 TABLET BY MOUTH TWICE DAILY  . aspirin EC 81 MG tablet Take 81 mg by mouth daily.  . calcium-vitamin D (OSCAL WITH D) 500-200 MG-UNIT tablet Take 1 tablet by mouth daily with breakfast.  . ferrous sulfate 325 (65 FE) MG tablet Take 325 mg by mouth daily with  breakfast.  . insulin aspart (NOVOLOG FLEXPEN) 100 UNIT/ML FlexPen Inject 10 Units into the skin 3 (three) times daily with meals.  . Lactobacillus Probiotic TABS Take 2 tablets by mouth daily.  Marland Kitchen lenalidomide (REVLIMID) 10 MG capsule TAKE 1 CAPSULE BY MOUTH  DAILY FOR 14 DAYS, THEN 14  DAYS OFF  . levothyroxine (SYNTHROID, LEVOTHROID) 25 MCG tablet Take 25 mcg by mouth daily before breakfast.  . loperamide (IMODIUM A-D) 2 MG tablet Take 2 mg by mouth 2 (two) times daily as needed for diarrhea or loose stools.  Marland Kitchen losartan (COZAAR) 50 MG tablet Take 1 tablet (50 mg total) by mouth daily.  . Multiple Vitamins-Minerals (MULTIVITAMIN WOMEN 50+ PO) Take by mouth daily.   . NON FORMULARY Regular Diet with Meals  . NON FORMULARY Wanderguard #1335 to ankle for safety awareness. Check placement and function qshift. Every Shift Day, Evening, Night  . Nutritional Supplements (ENSURE CLEAR PO) Take by mouth 2 (two) times daily. Due to variable meal intake, cognitive impairment and low BMI  . potassium chloride SA (KLOR-CON) 20 MEQ tablet Take 20 mEq by mouth daily.   . [DISCONTINUED] donepezil (ARICEPT) 10 MG tablet TAKE 1 TABLET BY MOUTH every evening     SIGNIFICANT DIAGNOSTIC EXAMS   PREVIOUS   08-10-20: t score: -3.758  NO NEW EXAMS.   LABS REVIEWED PREVIOUS    07-20-20: vit B 12: 620  07-23-20: tsh 2.264 07-24-20: wbc 3.9; hgb 9.3; hct 29.3 mcv 105.4 plt 105; glucose 106; bun 25; creat 1.28; k+ 3.6; na++ 142; a 9.2 liver normal albumin 3.5  07-30-20: chol 116; ldl 49; trig 71; hdl 53; hgb a1c 5.1; hep C neg  08-07-20: wbc 3.5; hgb 9.0; hct 27.9; mcv 104.5 plt 112; glucose 86; bun 27; creat 1.22; k+ 4.2; an++ 143; ca 9.0 liver normal albumin 3.2 08-14-20: wbc 2.8; hgb 9.4; hct 29.2; mcv 104.3 plt 101; glucose 71; bun 27; creat 1.41; k+ 4.3; na++ 140; ca 9.0 liver normal albumin 3.1  08-16-20: urine micro-albumin 9.3 08-21-20: wbc 3.9; hgb 9.5; hct 29.7 mcv 104.2 plt 86; glucose 86; bun 18; creat 1.09;  k+ 3.7; na++ 144; ca 8.1; liver normal albumin 3.1   TODAY  09-18-20: wbc 4.4; hgb 9.6; hct 29.7 mcv 102.4 plt 105; glucose 99; bun 20; creat 1.12; k+ 3.2; na++ 142; ca 8.8 liver normal albumin 3.2    Review of Systems  Constitutional: Negative for malaise/fatigue.  Respiratory: Negative for cough and shortness of breath.   Cardiovascular: Negative for chest pain, palpitations and leg swelling.  Gastrointestinal: Negative for abdominal pain, constipation and heartburn.  Musculoskeletal: Negative for back pain, joint pain and myalgias.  Skin: Negative.   Neurological: Negative for dizziness.  Psychiatric/Behavioral: The patient is not nervous/anxious.     Physical Exam Constitutional:      General: She is not in acute distress.    Appearance: She is underweight. She is not diaphoretic.  Neck:     Thyroid: No thyromegaly.  Cardiovascular:     Rate and Rhythm: Normal rate and regular rhythm.     Heart sounds: Murmur heard.      Comments: 2/6 pedal pulses faint Pulmonary:     Effort: Pulmonary effort is normal. No respiratory distress.     Breath sounds: Normal breath sounds.  Abdominal:     General: Bowel sounds are normal. There is no distension.     Palpations: Abdomen is soft.     Tenderness: There is no abdominal tenderness.  Musculoskeletal:        General: Normal range of motion.     Cervical back: Neck supple.     Right lower leg: No edema.     Left lower leg: No edema.  Lymphadenopathy:     Cervical: No cervical adenopathy.  Skin:    General: Skin is warm and dry.  Neurological:     Mental Status: She is alert. Mental status is at baseline.  Psychiatric:        Mood and Affect: Mood normal.     ASSESSMENT/ PLAN:  TODAY  1. Failure to thrive in adult is without change: weight is 107 pounds will continue supplements as directed  2. Hypokalemia: is stable k+ 3.2will continue k+ 20 meq daily   3. Hypothyroidism  Unspecified type: is stable tsh 2.264 will  continue synthroid 25 mcg daily    PREVIOUS   4. Hypertension associated with type 2 diabetes mellitus: is stable b/p 108/69 will continue cozaar 50 mg daily and asa 81 mg daily   5. Diabetes mellitus without complication: is stable hgb a1c 5.1 is on arb asa   6.Post menopausal osteoporosis: t score -3.758  Will continue  calcium and vit D   7. Compression fracture of L1 vertebrae with routinely healing subsequent encounter (06/2017) is stable has prn tylenol  8. Thrombocytopenia: is stable plt 105 will monitor   9. Multiple myeloma without achieving remission: is stable is followed by oncology will continue revlimid 100 mg on 14 days off 14 days. Will monitor   10. Macrocytic anemia: is stable hgb 9.6 vit B 12: 620 will continue iron daily   11. Vascular dementia with behavioral disturbance is stable weight is 107 pounds her aricept was stopped due to side effects   12. Herpes: no recent outbreak will continue acyclovir 400 mg twice daily      MD is aware of resident's narcotic use and is in agreement with current plan of care. We will attempt to wean resident as appropriate.  Ok Edwards NP [facility]  Contact [phone] Monday through Friday 8am- 5pm  After hours call [phone]

## 2020-10-02 ENCOUNTER — Encounter (HOSPITAL_COMMUNITY): Payer: Self-pay

## 2020-10-02 ENCOUNTER — Other Ambulatory Visit (HOSPITAL_COMMUNITY): Payer: Self-pay | Admitting: Hematology

## 2020-10-02 ENCOUNTER — Inpatient Hospital Stay (HOSPITAL_COMMUNITY): Payer: Medicare PPO

## 2020-10-02 ENCOUNTER — Other Ambulatory Visit: Payer: Self-pay

## 2020-10-02 VITALS — BP 120/48 | HR 65 | Temp 96.7°F | Resp 16 | Wt 109.7 lb

## 2020-10-02 DIAGNOSIS — C9000 Multiple myeloma not having achieved remission: Secondary | ICD-10-CM

## 2020-10-02 DIAGNOSIS — Z5111 Encounter for antineoplastic chemotherapy: Secondary | ICD-10-CM | POA: Diagnosis not present

## 2020-10-02 LAB — CBC WITH DIFFERENTIAL/PLATELET
Abs Immature Granulocytes: 0.01 10*3/uL (ref 0.00–0.07)
Basophils Absolute: 0.1 10*3/uL (ref 0.0–0.1)
Basophils Relative: 2 %
Eosinophils Absolute: 0.2 10*3/uL (ref 0.0–0.5)
Eosinophils Relative: 6 %
HCT: 29.1 % — ABNORMAL LOW (ref 36.0–46.0)
Hemoglobin: 9.7 g/dL — ABNORMAL LOW (ref 12.0–15.0)
Immature Granulocytes: 0 %
Lymphocytes Relative: 14 %
Lymphs Abs: 0.5 10*3/uL — ABNORMAL LOW (ref 0.7–4.0)
MCH: 34.6 pg — ABNORMAL HIGH (ref 26.0–34.0)
MCHC: 33.3 g/dL (ref 30.0–36.0)
MCV: 103.9 fL — ABNORMAL HIGH (ref 80.0–100.0)
Monocytes Absolute: 0.5 10*3/uL (ref 0.1–1.0)
Monocytes Relative: 15 %
Neutro Abs: 2 10*3/uL (ref 1.7–7.7)
Neutrophils Relative %: 63 %
Platelets: 120 10*3/uL — ABNORMAL LOW (ref 150–400)
RBC: 2.8 MIL/uL — ABNORMAL LOW (ref 3.87–5.11)
RDW: 15.9 % — ABNORMAL HIGH (ref 11.5–15.5)
WBC: 3.2 10*3/uL — ABNORMAL LOW (ref 4.0–10.5)
nRBC: 0 % (ref 0.0–0.2)

## 2020-10-02 LAB — COMPREHENSIVE METABOLIC PANEL
ALT: 21 U/L (ref 0–44)
AST: 30 U/L (ref 15–41)
Albumin: 3.1 g/dL — ABNORMAL LOW (ref 3.5–5.0)
Alkaline Phosphatase: 70 U/L (ref 38–126)
Anion gap: 10 (ref 5–15)
BUN: 17 mg/dL (ref 8–23)
CO2: 25 mmol/L (ref 22–32)
Calcium: 8.9 mg/dL (ref 8.9–10.3)
Chloride: 107 mmol/L (ref 98–111)
Creatinine, Ser: 1.13 mg/dL — ABNORMAL HIGH (ref 0.44–1.00)
GFR, Estimated: 47 mL/min — ABNORMAL LOW (ref >60)
Glucose, Bld: 144 mg/dL — ABNORMAL HIGH (ref 70–99)
Potassium: 3.5 mmol/L (ref 3.5–5.1)
Sodium: 142 mmol/L (ref 135–145)
Total Bilirubin: 0.7 mg/dL (ref 0.3–1.2)
Total Protein: 6.4 g/dL — ABNORMAL LOW (ref 6.5–8.1)

## 2020-10-02 MED ORDER — DEXAMETHASONE 4 MG PO TABS
10.0000 mg | ORAL_TABLET | Freq: Once | ORAL | Status: AC
  Administered 2020-10-02: 10 mg via ORAL
  Filled 2020-10-02: qty 3

## 2020-10-02 MED ORDER — BORTEZOMIB CHEMO SQ INJECTION 3.5 MG (2.5MG/ML)
1.3000 mg/m2 | Freq: Once | INTRAMUSCULAR | Status: AC
  Administered 2020-10-02: 2 mg via SUBCUTANEOUS
  Filled 2020-10-02: qty 0.8

## 2020-10-02 NOTE — Patient Instructions (Signed)
[facility] Discharge Instructions for Patients Receiving Chemotherapy   Beginning January 23rd 2017 lab work for the [facility] will be done in the  Main lab at [facility] on 1st floor. If you have a lab appointment with the [place] please come in thru the  Main Entrance and check in at the main information desk   Today you received the following chemotherapy agents Velcade injection. Follow-up as scheduled  To help prevent nausea and vomiting after your treatment, we encourage you to take your nausea medication   If you develop nausea and vomiting, or diarrhea that is not controlled by your medication, call the clinic.  The clinic phone number is [phone]. Office hours are Monday-Friday 8:30am-5:00pm.  BELOW ARE SYMPTOMS THAT SHOULD BE REPORTED IMMEDIATELY:  *FEVER GREATER THAN 101.0 F  *CHILLS WITH OR WITHOUT FEVER  NAUSEA AND VOMITING THAT IS NOT CONTROLLED WITH YOUR NAUSEA MEDICATION  *UNUSUAL SHORTNESS OF BREATH  *UNUSUAL BRUISING OR BLEEDING  TENDERNESS IN MOUTH AND THROAT WITH OR WITHOUT PRESENCE OF ULCERS  *URINARY PROBLEMS  *BOWEL PROBLEMS  UNUSUAL RASH Items with * indicate a potential emergency and should be followed up as soon as possible. If you have an emergency after office hours please contact your primary care physician or go to the nearest emergency department.  Please call the clinic during office hours if you have any questions or concerns.   You may also contact the Patient Navigator at [phone] should you have any questions or need assistance in obtaining follow up care.      Resources For Cancer Patients and their Caregivers ? American Cancer Society: Can assist with transportation, wigs, general needs, runs Look Good Feel Better.        ([phone] [phone] ? Cancer Care: Provides financial assistance, online support groups, medication/co-pay assistance.  1-800-813-HOPE 601-102-3986) ? [place] Assists [place] cancer patients and their families through emotional , educational and financial support.  815-742-3310 ? [place] DSS Where to apply for food stamps, Medicaid and utility assistance. 912-538-2174 ? RCATS: Transportation to medical appointments. ([phone] ? Social Security Administration: May apply for disability if have a Stage IV cancer. [phone] 240-674-5782 ? [employer], Disability and Transit Services: Assists with nutrition, care and transit needs. [phone]

## 2020-10-02 NOTE — Progress Notes (Signed)
Amanda Wu tolerated Velcade injection well without complaints or incident. VSS Labs reviewed prior to administering this medication. Pt discharged via wheelchair in satisfactory condition

## 2020-10-09 ENCOUNTER — Inpatient Hospital Stay (HOSPITAL_COMMUNITY): Payer: Medicare PPO

## 2020-10-09 ENCOUNTER — Other Ambulatory Visit: Payer: Self-pay

## 2020-10-09 ENCOUNTER — Encounter (HOSPITAL_COMMUNITY): Payer: Self-pay

## 2020-10-09 VITALS — BP 142/91 | HR 72 | Temp 96.8°F | Resp 17 | Wt 107.9 lb

## 2020-10-09 DIAGNOSIS — C9000 Multiple myeloma not having achieved remission: Secondary | ICD-10-CM

## 2020-10-09 DIAGNOSIS — Z5111 Encounter for antineoplastic chemotherapy: Secondary | ICD-10-CM | POA: Diagnosis not present

## 2020-10-09 LAB — CBC WITH DIFFERENTIAL/PLATELET
Abs Immature Granulocytes: 0.01 10*3/uL (ref 0.00–0.07)
Basophils Absolute: 0 10*3/uL (ref 0.0–0.1)
Basophils Relative: 1 %
Eosinophils Absolute: 0.1 10*3/uL (ref 0.0–0.5)
Eosinophils Relative: 4 %
HCT: 32.3 % — ABNORMAL LOW (ref 36.0–46.0)
Hemoglobin: 10.6 g/dL — ABNORMAL LOW (ref 12.0–15.0)
Immature Granulocytes: 0 %
Lymphocytes Relative: 18 %
Lymphs Abs: 0.6 10*3/uL — ABNORMAL LOW (ref 0.7–4.0)
MCH: 33.5 pg (ref 26.0–34.0)
MCHC: 32.8 g/dL (ref 30.0–36.0)
MCV: 102.2 fL — ABNORMAL HIGH (ref 80.0–100.0)
Monocytes Absolute: 0.5 10*3/uL (ref 0.1–1.0)
Monocytes Relative: 17 %
Neutro Abs: 1.8 10*3/uL (ref 1.7–7.7)
Neutrophils Relative %: 60 %
Platelets: 121 10*3/uL — ABNORMAL LOW (ref 150–400)
RBC: 3.16 MIL/uL — ABNORMAL LOW (ref 3.87–5.11)
RDW: 16.1 % — ABNORMAL HIGH (ref 11.5–15.5)
WBC: 3.1 10*3/uL — ABNORMAL LOW (ref 4.0–10.5)
nRBC: 0 % (ref 0.0–0.2)

## 2020-10-09 LAB — COMPREHENSIVE METABOLIC PANEL
ALT: 18 U/L (ref 0–44)
AST: 29 U/L (ref 15–41)
Albumin: 3.3 g/dL — ABNORMAL LOW (ref 3.5–5.0)
Alkaline Phosphatase: 59 U/L (ref 38–126)
Anion gap: 7 (ref 5–15)
BUN: 15 mg/dL (ref 8–23)
CO2: 26 mmol/L (ref 22–32)
Calcium: 9.4 mg/dL (ref 8.9–10.3)
Chloride: 107 mmol/L (ref 98–111)
Creatinine, Ser: 1.17 mg/dL — ABNORMAL HIGH (ref 0.44–1.00)
GFR, Estimated: 48 mL/min — ABNORMAL LOW (ref >60)
Glucose, Bld: 73 mg/dL (ref 70–99)
Potassium: 3.7 mmol/L (ref 3.5–5.1)
Sodium: 140 mmol/L (ref 135–145)
Total Bilirubin: 0.8 mg/dL (ref 0.3–1.2)
Total Protein: 6.6 g/dL (ref 6.5–8.1)

## 2020-10-09 LAB — LACTATE DEHYDROGENASE: LDH: 196 U/L — ABNORMAL HIGH (ref 98–192)

## 2020-10-09 MED ORDER — DEXAMETHASONE 4 MG PO TABS
10.0000 mg | ORAL_TABLET | Freq: Once | ORAL | Status: AC
  Administered 2020-10-09: 10 mg via ORAL
  Filled 2020-10-09: qty 3

## 2020-10-09 MED ORDER — BORTEZOMIB CHEMO SQ INJECTION 3.5 MG (2.5MG/ML)
1.3000 mg/m2 | Freq: Once | INTRAMUSCULAR | Status: AC
  Administered 2020-10-09: 2 mg via SUBCUTANEOUS
  Filled 2020-10-09: qty 0.8

## 2020-10-09 NOTE — Progress Notes (Signed)
Amanda Wu presents today for injection per the provider's orders.  Velcade administration without incident; injection site WNL; see MAR for injection details.  Patient tolerated procedure well and without incident.  No questions or complaints noted at this time.  Discharged via wheelchair in stable condition.

## 2020-10-10 LAB — PROTEIN ELECTROPHORESIS, SERUM
A/G Ratio: 1.1 (ref 0.7–1.7)
Albumin ELP: 3.2 g/dL (ref 2.9–4.4)
Alpha-1-Globulin: 0.3 g/dL (ref 0.0–0.4)
Alpha-2-Globulin: 0.7 g/dL (ref 0.4–1.0)
Beta Globulin: 0.7 g/dL (ref 0.7–1.3)
Gamma Globulin: 1.3 g/dL (ref 0.4–1.8)
Globulin, Total: 2.9 g/dL (ref 2.2–3.9)
M-Spike, %: 0.5 g/dL — ABNORMAL HIGH
Total Protein ELP: 6.1 g/dL (ref 6.0–8.5)

## 2020-10-10 LAB — KAPPA/LAMBDA LIGHT CHAINS
Kappa free light chain: 23.5 mg/L — ABNORMAL HIGH (ref 3.3–19.4)
Kappa, lambda light chain ratio: 1.04 (ref 0.26–1.65)
Lambda free light chains: 22.5 mg/L (ref 5.7–26.3)

## 2020-10-10 MED ORDER — DIPHENHYDRAMINE HCL 25 MG PO CAPS
ORAL_CAPSULE | ORAL | Status: AC
  Filled 2020-10-10: qty 1

## 2020-10-16 ENCOUNTER — Inpatient Hospital Stay (HOSPITAL_BASED_OUTPATIENT_CLINIC_OR_DEPARTMENT_OTHER): Payer: Medicare PPO | Admitting: Hematology

## 2020-10-16 ENCOUNTER — Inpatient Hospital Stay (HOSPITAL_COMMUNITY): Payer: Medicare PPO | Attending: Hematology

## 2020-10-16 ENCOUNTER — Inpatient Hospital Stay (HOSPITAL_COMMUNITY): Payer: Medicare PPO

## 2020-10-16 ENCOUNTER — Other Ambulatory Visit: Payer: Self-pay

## 2020-10-16 VITALS — BP 133/78 | HR 73 | Temp 97.2°F | Resp 16 | Wt 110.0 lb

## 2020-10-16 DIAGNOSIS — E039 Hypothyroidism, unspecified: Secondary | ICD-10-CM | POA: Diagnosis not present

## 2020-10-16 DIAGNOSIS — E1136 Type 2 diabetes mellitus with diabetic cataract: Secondary | ICD-10-CM | POA: Insufficient documentation

## 2020-10-16 DIAGNOSIS — Z5111 Encounter for antineoplastic chemotherapy: Secondary | ICD-10-CM | POA: Diagnosis not present

## 2020-10-16 DIAGNOSIS — Z79899 Other long term (current) drug therapy: Secondary | ICD-10-CM | POA: Diagnosis not present

## 2020-10-16 DIAGNOSIS — D631 Anemia in chronic kidney disease: Secondary | ICD-10-CM | POA: Diagnosis not present

## 2020-10-16 DIAGNOSIS — I129 Hypertensive chronic kidney disease with stage 1 through stage 4 chronic kidney disease, or unspecified chronic kidney disease: Secondary | ICD-10-CM | POA: Insufficient documentation

## 2020-10-16 DIAGNOSIS — N189 Chronic kidney disease, unspecified: Secondary | ICD-10-CM | POA: Insufficient documentation

## 2020-10-16 DIAGNOSIS — C9000 Multiple myeloma not having achieved remission: Secondary | ICD-10-CM | POA: Diagnosis not present

## 2020-10-16 DIAGNOSIS — F0390 Unspecified dementia without behavioral disturbance: Secondary | ICD-10-CM | POA: Diagnosis not present

## 2020-10-16 DIAGNOSIS — E1122 Type 2 diabetes mellitus with diabetic chronic kidney disease: Secondary | ICD-10-CM | POA: Insufficient documentation

## 2020-10-16 DIAGNOSIS — R197 Diarrhea, unspecified: Secondary | ICD-10-CM | POA: Diagnosis not present

## 2020-10-16 LAB — CBC WITH DIFFERENTIAL/PLATELET
Abs Immature Granulocytes: 0.01 10*3/uL (ref 0.00–0.07)
Basophils Absolute: 0 10*3/uL (ref 0.0–0.1)
Basophils Relative: 1 %
Eosinophils Absolute: 0.2 10*3/uL (ref 0.0–0.5)
Eosinophils Relative: 6 %
HCT: 32.3 % — ABNORMAL LOW (ref 36.0–46.0)
Hemoglobin: 10.2 g/dL — ABNORMAL LOW (ref 12.0–15.0)
Immature Granulocytes: 0 %
Lymphocytes Relative: 15 %
Lymphs Abs: 0.6 10*3/uL — ABNORMAL LOW (ref 0.7–4.0)
MCH: 33.1 pg (ref 26.0–34.0)
MCHC: 31.6 g/dL (ref 30.0–36.0)
MCV: 104.9 fL — ABNORMAL HIGH (ref 80.0–100.0)
Monocytes Absolute: 0.3 10*3/uL (ref 0.1–1.0)
Monocytes Relative: 9 %
Neutro Abs: 2.7 10*3/uL (ref 1.7–7.7)
Neutrophils Relative %: 69 %
Platelets: 95 10*3/uL — ABNORMAL LOW (ref 150–400)
RBC: 3.08 MIL/uL — ABNORMAL LOW (ref 3.87–5.11)
RDW: 15.8 % — ABNORMAL HIGH (ref 11.5–15.5)
WBC: 3.9 10*3/uL — ABNORMAL LOW (ref 4.0–10.5)
nRBC: 0 % (ref 0.0–0.2)

## 2020-10-16 LAB — COMPREHENSIVE METABOLIC PANEL
ALT: 26 U/L (ref 0–44)
AST: 34 U/L (ref 15–41)
Albumin: 3.2 g/dL — ABNORMAL LOW (ref 3.5–5.0)
Alkaline Phosphatase: 59 U/L (ref 38–126)
Anion gap: 6 (ref 5–15)
BUN: 19 mg/dL (ref 8–23)
CO2: 24 mmol/L (ref 22–32)
Calcium: 8.8 mg/dL — ABNORMAL LOW (ref 8.9–10.3)
Chloride: 109 mmol/L (ref 98–111)
Creatinine, Ser: 1.25 mg/dL — ABNORMAL HIGH (ref 0.44–1.00)
GFR, Estimated: 45 mL/min — ABNORMAL LOW (ref >60)
Glucose, Bld: 98 mg/dL (ref 70–99)
Potassium: 3.9 mmol/L (ref 3.5–5.1)
Sodium: 139 mmol/L (ref 135–145)
Total Bilirubin: 0.6 mg/dL (ref 0.3–1.2)
Total Protein: 6.2 g/dL — ABNORMAL LOW (ref 6.5–8.1)

## 2020-10-16 MED ORDER — BORTEZOMIB CHEMO SQ INJECTION 3.5 MG (2.5MG/ML)
1.3000 mg/m2 | Freq: Once | INTRAMUSCULAR | Status: AC
  Administered 2020-10-16: 2 mg via SUBCUTANEOUS
  Filled 2020-10-16: qty 0.8

## 2020-10-16 MED ORDER — DEXAMETHASONE 4 MG PO TABS
10.0000 mg | ORAL_TABLET | Freq: Once | ORAL | Status: AC
  Administered 2020-10-16: 10 mg via ORAL
  Filled 2020-10-16: qty 3

## 2020-10-16 NOTE — Patient Instructions (Signed)
Amanda Wu at [facility] Discharge Instructions  You were seen today by Dr. Delton Coombes. He went over your recent results. You received your treatment today; continue receiving your weekly treatment. Dr. Delton Coombes will see you back in 3 months for labs and follow up.   Thank you for choosing [place] at [facility] to provide your oncology and hematology care.  To afford each patient quality time with our provider, please arrive at least 15 minutes before your scheduled appointment time.   If you have a lab appointment with the [place] please come in thru the Main Entrance and check in at the main information desk  You need to re-schedule your appointment should you arrive 10 or more minutes late.  We strive to give you quality time with our providers, and arriving late affects you and other patients whose appointments are after yours.  Also, if you no show three or more times for appointments you may be dismissed from the clinic at the providers discretion.     Again, thank you for choosing [facility].  Our hope is that these requests will decrease the amount of time that you wait before being seen by our physicians.       _____________________________________________________________  Should you have questions after your visit to [facility], please contact our office at [phone] between the hours of 8:00 a.m. and 4:30 p.m.  Voicemails left after 4:00 p.m. will not be returned until the following business day.  For prescription refill requests, have your pharmacy contact our office and allow 72 hours.    Cancer Center Support Programs:   > Cancer Support Group  2nd Tuesday of the month 1pm-2pm, Journey Room

## 2020-10-16 NOTE — Progress Notes (Signed)
Patient was assessed by Dr. Katragadda and labs have been reviewed.  Patient is okay to proceed with treatment today. Primary RN and pharmacy aware.   

## 2020-10-16 NOTE — Progress Notes (Signed)
[place] reviewed with and pt seen by Dr. Delton Coombes and pt approved for Velcade injection                                                        Amanda Wu tolerated Velcade injection well without complaints or incident. Pt discharged via wheelchair in satisfactory condition

## 2020-10-16 NOTE — Patient Instructions (Signed)
[facility] Discharge Instructions for Patients Receiving Chemotherapy   Beginning January 23rd 2017 lab work for the [facility] will be done in the  Main lab at [facility] on 1st floor. If you have a lab appointment with the [place] please come in thru the  Main Entrance and check in at the main information desk   Today you received the following chemotherapy agents Velcade injection. Follow-up as scheduled  To help prevent nausea and vomiting after your treatment, we encourage you to take your nausea medication   If you develop nausea and vomiting, or diarrhea that is not controlled by your medication, call the clinic.  The clinic phone number is [phone]. Office hours are Monday-Friday 8:30am-5:00pm.  BELOW ARE SYMPTOMS THAT SHOULD BE REPORTED IMMEDIATELY:  *FEVER GREATER THAN 101.0 F  *CHILLS WITH OR WITHOUT FEVER  NAUSEA AND VOMITING THAT IS NOT CONTROLLED WITH YOUR NAUSEA MEDICATION  *UNUSUAL SHORTNESS OF BREATH  *UNUSUAL BRUISING OR BLEEDING  TENDERNESS IN MOUTH AND THROAT WITH OR WITHOUT PRESENCE OF ULCERS  *URINARY PROBLEMS  *BOWEL PROBLEMS  UNUSUAL RASH Items with * indicate a potential emergency and should be followed up as soon as possible. If you have an emergency after office hours please contact your primary care physician or go to the nearest emergency department.  Please call the clinic during office hours if you have any questions or concerns.   You may also contact the Patient Navigator at [phone] should you have any questions or need assistance in obtaining follow up care.      Resources For Cancer Patients and their Caregivers ? American Cancer Society: Can assist with transportation, wigs, general needs, runs Look Good Feel Better.        308-416-9272 ? Cancer Care: Provides financial assistance, online support groups, medication/co-pay assistance.  1-800-813-HOPE 781-319-1669) ? [place] Assists [place] cancer patients and their families through emotional , educational and financial support.  ([phone] [phone] ? [place] DSS Where to apply for food stamps, Medicaid and utility assistance. 517-339-2938 ? RCATS: Transportation to medical appointments. 678-310-6297 ? Social Security Administration: May apply for disability if have a Stage IV cancer. [phone] ? [employer], Disability and Transit Services: Assists with nutrition, care and transit needs. [phone]

## 2020-10-16 NOTE — Progress Notes (Signed)
Amanda Wu, Amanda Wu   CLINIC:  Medical Oncology/Hematology  PCP:  Gerlene Fee, NP Amanda Wu [phone]   REASON FOR VISIT:  Follow-up for multiple myeloma  PRIOR THERAPY: None  NGS Results: Not done  CURRENT THERAPY: Velcade & Decadron 3 weeks on, 1 week off; Revlimid 2 weeks on, 2 weeks off  BRIEF ONCOLOGIC HISTORY:  Oncology History  Multiple myeloma ([place])  07/09/2017 Initial Diagnosis   Multiple myeloma ([place])   06/01/2018 -  Chemotherapy   The patient had dexamethasone (DECADRON) tablet 10 mg, 10 mg (100 % of original dose 10 mg), Oral,  Once, 6 of 6 cycles Dose modification: 10 mg (original dose 10 mg, Cycle 1) Administration: 10 mg (06/01/2018), 10 mg (06/08/2018), 10 mg (06/29/2018), 10 mg (07/06/2018), 10 mg (07/27/2018), 10 mg (08/03/2018), 10 mg (08/10/2018), 10 mg (08/24/2018), 10 mg (08/31/2018), 10 mg (09/07/2018), 10 mg (09/21/2018), 10 mg (09/28/2018), 10 mg (10/05/2018), 10 mg (10/19/2018), 10 mg (10/26/2018), 10 mg (11/02/2018) dexamethasone (DECADRON) tablet 10 mg, 10 mg (100 % of original dose 10 mg), Oral,  Once, 25 of 26 cycles Dose modification: 10 mg (original dose 10 mg, Cycle 7) Administration: 10 mg (11/16/2018), 10 mg (11/23/2018), 10 mg (11/30/2018), 10 mg (12/17/2018), 10 mg (12/24/2018), 10 mg (01/04/2019), 10 mg (01/18/2019), 10 mg (01/25/2019), 10 mg (02/01/2019), 10 mg (02/15/2019), 10 mg (02/22/2019), 10 mg (03/01/2019), 10 mg (03/15/2019), 10 mg (03/23/2019), 10 mg (03/31/2019), 10 mg (04/12/2019), 10 mg (04/19/2019), 10 mg (04/26/2019), 10 mg (05/10/2019), 10 mg (05/17/2019), 10 mg (05/24/2019), 10 mg (06/07/2019), 10 mg (06/14/2019), 10 mg (06/21/2019), 10 mg (07/05/2019), 10 mg (07/12/2019), 10 mg (07/19/2019), 10 mg (08/02/2019), 10 mg (08/09/2019), 10 mg (08/16/2019), 10 mg (08/30/2019), 10 mg (09/06/2019), 10 mg (09/13/2019), 10 mg (09/27/2019), 10 mg (10/04/2019), 10 mg (10/11/2019), 10 mg (10/25/2019), 10 mg  (11/08/2019), 10 mg (11/22/2019), 10 mg (11/29/2019), 10 mg (12/06/2019), 10 mg (12/20/2019), 10 mg (12/27/2019), 10 mg (01/03/2020), 10 mg (01/17/2020), 10 mg (01/24/2020), 10 mg (01/31/2020), 10 mg (02/21/2020), 10 mg (02/28/2020), 10 mg (03/06/2020), 10 mg (03/20/2020), 10 mg (03/27/2020), 10 mg (04/03/2020), 10 mg (04/17/2020), 10 mg (04/24/2020), 10 mg (05/01/2020), 10 mg (05/15/2020), 10 mg (05/22/2020), 10 mg (05/29/2020), 10 mg (06/12/2020), 10 mg (06/19/2020), 10 mg (06/26/2020), 10 mg (07/10/2020), 10 mg (07/17/2020), 10 mg (07/24/2020), 10 mg (08/07/2020), 10 mg (08/14/2020), 10 mg (08/21/2020), 10 mg (09/04/2020), 10 mg (09/11/2020), 10 mg (09/18/2020), 10 mg (10/02/2020) bortezomib SQ (VELCADE) chemo injection 1.75 mg, 1 mg/m2 = 1.75 mg (100 % of original dose 1 mg/m2), Subcutaneous,  Once, 31 of 32 cycles Dose modification: 1 mg/m2 (original dose 1 mg/m2, Cycle 1, Reason: Provider Judgment), 1.3 mg/m2 (original dose 1 mg/m2, Cycle 8, Reason: Provider Judgment) Administration: 1.75 mg (06/01/2018), 1.75 mg (06/08/2018), 1.75 mg (06/15/2018), 1.75 mg (06/29/2018), 1.75 mg (07/06/2018), 1.75 mg (07/13/2018), 1.75 mg (07/27/2018), 1.75 mg (08/03/2018), 1.75 mg (08/10/2018), 1.75 mg (08/24/2018), 1.75 mg (08/31/2018), 1.75 mg (09/07/2018), 1.75 mg (09/21/2018), 1.75 mg (09/28/2018), 1.75 mg (10/05/2018), 1.75 mg (10/19/2018), 1.75 mg (10/26/2018), 1.75 mg (11/02/2018), 1.75 mg (11/16/2018), 1.75 mg (11/23/2018), 1.75 mg (11/30/2018), 2.25 mg (12/17/2018), 2.25 mg (12/24/2018), 2.25 mg (01/04/2019), 2.25 mg (01/18/2019), 2.25 mg (01/25/2019), 2.25 mg (02/01/2019), 2.25 mg (02/15/2019), 2.25 mg (02/22/2019), 2.25 mg (03/01/2019), 2.25 mg (03/15/2019), 2.25 mg (03/23/2019), 2.25 mg (03/31/2019), 2.25 mg (04/12/2019), 2.25 mg (04/19/2019), 2.25 mg (04/26/2019), 2.25 mg (05/10/2019), 2.25 mg (05/17/2019), 2.25 mg (05/24/2019),  2.25 mg (06/07/2019), 2.25 mg (06/14/2019), 2.25 mg (06/21/2019), 2.25 mg (07/05/2019), 2.25 mg (07/12/2019), 2.25 mg (07/19/2019), 2.25 mg (08/02/2019), 2.25 mg (08/09/2019),  2.25 mg (08/16/2019), 2.25 mg (08/30/2019), 2.25 mg (09/06/2019), 2.25 mg (09/13/2019), 2.25 mg (09/27/2019), 2.25 mg (10/04/2019), 2.25 mg (10/11/2019), 2.25 mg (10/25/2019), 2.25 mg (11/08/2019), 2.25 mg (11/22/2019), 2.25 mg (11/29/2019), 2.25 mg (12/06/2019), 2.25 mg (12/20/2019), 2.25 mg (12/27/2019), 2.25 mg (01/03/2020), 2.25 mg (01/17/2020), 2.25 mg (01/24/2020), 2.25 mg (01/31/2020), 2.25 mg (02/21/2020), 2.25 mg (02/28/2020), 2.25 mg (03/06/2020), 2.25 mg (03/20/2020), 2.25 mg (03/27/2020), 2 mg (04/03/2020), 2 mg (04/17/2020), 2 mg (04/24/2020), 2 mg (05/01/2020), 2 mg (05/15/2020), 2 mg (05/22/2020), 2 mg (05/29/2020), 2 mg (06/12/2020), 2 mg (06/19/2020), 2 mg (06/26/2020), 2 mg (07/10/2020), 2 mg (07/17/2020), 2 mg (07/24/2020), 2 mg (08/07/2020), 2 mg (08/14/2020), 2 mg (08/21/2020), 2 mg (09/04/2020), 2 mg (09/11/2020), 2 mg (09/18/2020), 2 mg (10/02/2020)  for chemotherapy treatment.      CANCER STAGING: Cancer Staging No matching staging information was found for the patient.  INTERVAL HISTORY:  Amanda Wu, a 74 y.o. female, returns for routine follow-up and consideration for next cycle of chemotherapy. [name] was last seen on 08/21/2020.  Due for day #15 of cycle #31 of bortezomib today.   Today she is accompanied her her sisters. Overall, she tells me she has been feeling okay. She reports having 2 watery BM's daily since she came to the nursing home. She takes Imodium PRN which helps. She is taking Revlimid 2 weeks on, 2 weeks off, and is tolerating the Velcade well.  Overall, she feels ready for next cycle of chemo today.    REVIEW OF SYSTEMS:  Review of Systems  Constitutional: Positive for appetite change (50%).  Gastrointestinal: Positive for diarrhea (constant).  All other systems reviewed and are negative.   PAST MEDICAL/SURGICAL HISTORY:  Past Medical History:  Diagnosis Date  . Benign hypertension   . Cataract   . 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 Diabetes mellitus without complication  ([place])   . H/O partial nephrectomy   . Hypokalemia   . Hypothyroidism   . Impaired cognition   . Multiple myeloma ([facility])    Dr Maylon Peppers, [facility]  . Thyroid disease    Past Surgical History:  Procedure Laterality Date  . ABDOMINAL HYSTERECTOMY    . CRANIOTOMY     for lymphoma  . YAG LASER APPLICATION Right 06/16/5008   Procedure: YAG LASER APPLICATION;  Surgeon: [name], MD;  Location: [facility];  Service: Ophthalmology;  Laterality: Right;    SOCIAL HISTORY:  Social History   Socioeconomic History  . Marital status: Single    Spouse name: Not on file  . Number of children: Not on file  . Years of education: Not on file  . Highest education level: Not on file  Occupational History  . Occupation: retired   Tobacco Use  . Smoking status: Never Smoker  . Smokeless tobacco: Never Used  Substance and Sexual Activity  . Alcohol use: No  . Drug use: No  . Sexual activity: Never  Other Topics Concern  . Not on file  Social History Narrative   Long term resident of [facility]    Social Determinants of Health   Financial Resource Strain:   . Difficulty of Paying Living Expenses: Not on file  Food Insecurity:   . Worried About Charity fundraiser in the Last Year: Not on file  . Ran Out of Food in the  Last Year: Not on file  Transportation Needs: No Transportation Needs  . Lack of Transportation (Medical): No  . Lack of Transportation (Non-Medical): No  Physical Activity: Inactive  . Days of Exercise per Week: 0 days  . Minutes of Exercise per Session: 0 min  Stress:   . Feeling of Stress : Not on file  Social Connections:   . Frequency of Communication with Friends and Family: Not on file  . Frequency of Social Gatherings with Friends and Family: Not on file  . Attends Religious Services: Not on file  . Active Member of Clubs or Organizations: Not on file  . Attends Archivist Meetings: Not on file  . Marital Status: Not on file  Intimate Partner Violence:   .  Fear of Current or Ex-Partner: Not on file  . Emotionally Abused: Not on file  . Physically Abused: Not on file  . Sexually Abused: Not on file    FAMILY HISTORY:  Family History  Problem Relation Age of Onset  . Depression Mother   . Diabetes Mother   . Heart disease Father   . Cancer - Prostate Brother   . Cancer Brother   . Cancer Sister     CURRENT MEDICATIONS:  No current facility-administered medications for this visit.   Current Outpatient Medications  Medication Sig Dispense Refill  . acyclovir (ZOVIRAX) 400 MG tablet TAKE 1 TABLET BY MOUTH TWICE DAILY 60 tablet 2  . lenalidomide (REVLIMID) 10 MG capsule TAKE 1 CAPSULE BY MOUTH  DAILY FOR 14 DAYS, THEN 14  DAYS OFF 14 capsule 0   Facility-Administered Medications Ordered in Other Visits  Medication Dose Route Frequency Provider Last Rate Last Admin  . prochlorperazine (COMPAZINE) tablet 10 mg  10 mg Oral Once Derek Jack, MD        ALLERGIES:  No Known Allergies  PHYSICAL EXAM:  Performance status (ECOG): 2 - Symptomatic, <50% confined to bed  Vitals:   10/16/20 1418  BP: 133/78  Pulse: 73  Resp: 16  Temp: (!) 97.2 F (36.2 C)  SpO2: 100%   Wt Readings from Last 3 Encounters:  10/16/20 110 lb (49.9 kg)  10/09/20 107 lb 14.4 oz (48.9 kg)  10/02/20 109 lb 11.2 oz (49.8 kg)   Physical Exam Vitals reviewed.  Constitutional:      Appearance: Normal appearance.  Cardiovascular:     Rate and Rhythm: Normal rate and regular rhythm.     Pulses: Normal pulses.     Heart sounds: Normal heart sounds.  Pulmonary:     Effort: Pulmonary effort is normal.     Breath sounds: Normal breath sounds.  Musculoskeletal:     Right lower leg: No edema.     Left lower leg: No edema.  Neurological:     General: No focal deficit present.     Mental Status: She is alert and oriented to person, place, and time.  Psychiatric:        Mood and Affect: Mood normal.        Behavior: Behavior normal.      LABORATORY DATA:  I have reviewed the labs as listed.  CBC Latest Ref Rng & Units 10/16/2020 10/09/2020 10/02/2020  WBC 4.0 - 10.5 K/uL 3.9(L) 3.1(L) 3.2(L)  Hemoglobin 12.0 - 15.0 g/dL 10.2(L) 10.6(L) 9.7(L)  Hematocrit 36 - 46 % 32.3(L) 32.3(L) 29.1(L)  Platelets 150 - 400 K/uL 95(L) 121(L) 120(L)   CMP Latest Ref Rng & Units 10/16/2020 10/09/2020 10/02/2020  Glucose 70 -  99 mg/dL 98 73 144(H)  BUN 8 - 23 mg/dL '19 15 17  ' Creatinine 0.44 - 1.00 mg/dL 1.25(H) 1.17(H) 1.13(H)  Sodium 135 - 145 mmol/L 139 140 142  Potassium 3.5 - 5.1 mmol/L 3.9 3.7 3.5  Chloride 98 - 111 mmol/L 109 107 107  CO2 22 - 32 mmol/L '24 26 25  ' Calcium 8.9 - 10.3 mg/dL 8.8(L) 9.4 8.9  Total Protein 6.5 - 8.1 g/dL 6.2(L) 6.6 6.4(L)  Total Bilirubin 0.3 - 1.2 mg/dL 0.6 0.8 0.7  Alkaline Phos 38 - 126 U/L 59 59 70  AST 15 - 41 U/L 34 29 30  ALT 0 - 44 U/L '26 18 21   ' Lab Results  Component Value Date   LDH 196 (H) 10/09/2020   LDH 183 09/04/2020   LDH 160 08/14/2020   Lab Results  Component Value Date   TOTALPROTELP 6.1 10/09/2020   ALBUMINELP 3.2 10/09/2020   A1GS 0.3 10/09/2020   A2GS 0.7 10/09/2020   BETS 0.7 10/09/2020   GAMS 1.3 10/09/2020   MSPIKE 0.5 (H) 10/09/2020   SPEI Comment 10/09/2020    Lab Results  Component Value Date   KPAFRELGTCHN 23.5 (H) 10/09/2020   LAMBDASER 22.5 10/09/2020   KAPLAMBRATIO 1.04 10/09/2020    DIAGNOSTIC IMAGING:  I have independently reviewed the scans and discussed with the patient. No results found.   ASSESSMENT:  1. IgG lambda multiple myeloma: -She is on Revlimid 10 mg 2 weeks on/2 weeks of along with Velcade 3 weeks on/1 week off. Dexamethasone 10 mg on days of Velcade. -Myeloma panel on 04/17/2020 shows M spike 0.5 g. Kappa light chains are 44. Lambda light chains are 34. Ratio is 1.3. -Resident of [facility] since 07/19/2020.  2. 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   PLAN:  1. IgG lambda  multiple myeloma: -She is tolerating Revlimid 10 mg 2 weeks on/2 weeks off.  She also takes Velcade 3 weeks on 1 week off. -Reviewed myeloma labs from 10/09/2020.  M spike is stable at 0.5 g.  Free light chain ratio is normal.  Kappa light chains are 23.5.  Lambda light chains are normal. -CBC today shows platelet count 95 with white count of 3.9 with 69% neutrophils.  Creatinine is 1.25.  Albumin is 3.2. -She will continue current treatment plan.  RTC 12 weeks for follow-up with repeat labs.  2. CKD: -Creatinine is 1.25 and more or less stable.  3. Weight loss: -She has gained 2 more pounds since last visit.  4. Dementia: -Continue Aricept 10 mg daily.  5. Shingles prophylaxis: -Continue acyclovir 400 mg twice daily.  6. Myeloma bone disease: -She was treated with Zometa for several years which was discontinued.  7. Macrocytic anemia: -Hemoglobin is 10.2 with MCV of 104.  Likely from myelosuppression and CKD.  8.  Diarrhea: -Diarrhea likely from Velcade and/or Revlimid. -It is not severely bothering her.  Weight is stable.  Use Imodium as needed.  She is not requiring Imodium on a daily basis.   Orders placed this encounter:  No orders of the defined types were placed in this encounter.    Derek Jack, MD [place] [phone]   I, Milinda Antis, am acting as a scribe for Dr. Sanda Linger.  I, Derek Jack MD, have reviewed the above documentation for accuracy and completeness, and I agree with the above.

## 2020-10-17 ENCOUNTER — Other Ambulatory Visit (HOSPITAL_COMMUNITY)
Admission: AD | Admit: 2020-10-17 | Discharge: 2020-10-17 | Disposition: A | Discharge status: Home / Self Care | Payer: Medicare PPO | Source: Skilled Nursing Facility | Attending: Adult Health | Admitting: Adult Health

## 2020-10-17 DIAGNOSIS — K921 Melena: Secondary | ICD-10-CM | POA: Insufficient documentation

## 2020-10-17 MED ORDER — CYANOCOBALAMIN 1000 MCG/ML IJ SOLN
INTRAMUSCULAR | Status: AC
  Filled 2020-10-17: qty 1

## 2020-10-18 ENCOUNTER — Non-Acute Institutional Stay (SKILLED_NURSING_FACILITY): Payer: Medicare PPO | Admitting: Adult Health

## 2020-10-18 ENCOUNTER — Encounter: Payer: Self-pay | Admitting: Adult Health

## 2020-10-18 DIAGNOSIS — R197 Diarrhea, unspecified: Secondary | ICD-10-CM | POA: Diagnosis not present

## 2020-10-18 DIAGNOSIS — D539 Nutritional anemia, unspecified: Secondary | ICD-10-CM

## 2020-10-18 DIAGNOSIS — E119 Type 2 diabetes mellitus without complications: Secondary | ICD-10-CM | POA: Diagnosis not present

## 2020-10-18 NOTE — Progress Notes (Signed)
Location:    [facility] Room Number: 148/W Place of Service:  SNF (31)   CODE STATUS: Full Code  No Known Allergies  Chief Complaint  Patient presents with  . Acute Visit    Diabetes    HPI:  She had been placed on insulin due to elevated cbg readings associated with decadron. Her readings are normal with a few low readings. She denies any weakness; no excessive hunger of thirst. She is having diarrhea 3-4 times daily. No abdominal pain; no blood present; no nausea or vomiting present. She taking iron daily which can cause diarrhea.    Past Medical History:  Diagnosis Date  . Benign hypertension   . Cataract   . 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 Diabetes mellitus without complication ([place])   . H/O partial nephrectomy   . Hypokalemia   . Hypothyroidism   . Impaired cognition   . Multiple myeloma (HCC)    Dr Maylon Peppers, [facility]  . Thyroid disease     Past Surgical History:  Procedure Laterality Date  . ABDOMINAL HYSTERECTOMY    . CRANIOTOMY     for lymphoma  . YAG LASER APPLICATION Right 07/24/1750   Procedure: YAG LASER APPLICATION;  Surgeon: [name], MD;  Location: [facility];  Service: Ophthalmology;  Laterality: Right;    Social History   Socioeconomic History  . Marital status: Single    Spouse name: Not on file  . Number of children: Not on file  . Years of education: Not on file  . Highest education level: Not on file  Occupational History  . Occupation: retired   Tobacco Use  . Smoking status: Never Smoker  . Smokeless tobacco: Never Used  Substance and Sexual Activity  . Alcohol use: No  . Drug use: No  . Sexual activity: Never  Other Topics Concern  . Not on file  Social History Narrative   Long term resident of [facility]    Social Determinants of Health   Financial Resource Strain: Low Risk   . Difficulty of Paying Living Expenses: Not very hard  Food Insecurity: No Food Insecurity  . Worried About Sales executive in the Last Year: Never true  . Ran Out of Food in the Last Year: Never true  Transportation Needs: No Transportation Needs  . Lack of Transportation (Medical): No  . Lack of Transportation (Non-Medical): No  Physical Activity: Inactive  . Days of Exercise per Week: 0 days  . Minutes of Exercise per Session: 0 min  Stress: No Stress Concern Present  . Feeling of Stress : Not at all  Social Connections: Moderately Isolated  . Frequency of Communication with Friends and Family: More than three times a week  . Frequency of Social Gatherings with Friends and Family: Once a week  . Attends Religious Services: More than 4 times per year  . Active Member of Clubs or Organizations: No  . Attends Archivist Meetings: Never  . Marital Status: Never married  Intimate Partner Violence:   . Fear of Current or Ex-Partner: Not on file  . Emotionally Abused: Not on file  . Physically Abused: Not on file  . Sexually Abused: Not on file   Family History  Problem Relation Age of Onset  . Depression Mother   . Diabetes Mother   . Heart disease Father   . Cancer - Prostate Brother   . Cancer Brother   . Cancer Sister  VITAL SIGNS BP 116/76   Pulse 86   Temp (!) 97.1 F (36.2 C)   Ht _0  (1.753 m)   Wt 110 lb 12.8 oz (50.3 kg)   BMI 16.36 kg/m   Facility-Administered Encounter Medications as of 10/18/2020  Medication  . prochlorperazine (COMPAZINE) tablet 10 mg   Outpatient Encounter Medications as of 10/18/2020  Medication Sig  . acetaminophen (TYLENOL) 500 MG tablet Take 1,000 mg by mouth every 6 (six) hours as needed for mild pain.   Marland Kitchen acyclovir (ZOVIRAX) 400 MG tablet TAKE 1 TABLET BY MOUTH TWICE DAILY  . aspirin EC 81 MG tablet Take 81 mg by mouth daily.  . calcium-vitamin D (OSCAL WITH D) 500-200 MG-UNIT tablet Take 1 tablet by mouth daily with breakfast.  . ferrous sulfate 325 (65 FE) MG tablet Take 325 mg by mouth daily with breakfast.  . insulin aspart  (NOVOLOG FLEXPEN) 100 UNIT/ML FlexPen Inject 10 Units into the skin 3 (three) times daily with meals.  . Lactobacillus Probiotic TABS Take 2 tablets by mouth daily.  Marland Kitchen lenalidomide (REVLIMID) 10 MG capsule TAKE 1 CAPSULE BY MOUTH  DAILY FOR 14 DAYS, THEN 14  DAYS OFF  . levothyroxine (SYNTHROID, LEVOTHROID) 25 MCG tablet Take 25 mcg by mouth daily before breakfast.  . loperamide (IMODIUM A-D) 2 MG tablet Take 2 mg by mouth 2 (two) times daily as needed for diarrhea or loose stools.  Marland Kitchen losartan (COZAAR) 50 MG tablet Take 1 tablet (50 mg total) by mouth daily.  . Multiple Vitamins-Minerals (MULTIVITAMIN WOMEN 50+ PO) Take by mouth daily.   . NON FORMULARY Regular Diet with Meals  . NON FORMULARY Wanderguard #1335 to ankle for safety awareness. Check placement and function qshift. Every Shift Day, Evening, Night  . Nutritional Supplements (ENSURE CLEAR PO) Take by mouth 2 (two) times daily. Due to variable meal intake, cognitive impairment and low BMI  . polycarbophil (FIBERCON) 625 MG tablet Take 1,250 mg by mouth daily.  . potassium chloride SA (KLOR-CON) 20 MEQ tablet Take 20 mEq by mouth daily.      SIGNIFICANT DIAGNOSTIC EXAMS   PREVIOUS   08-10-20: t score: -3.758  NO NEW EXAMS.   LABS REVIEWED PREVIOUS    07-20-20: vit B 12: 620  07-23-20: tsh 2.264 07-24-20: wbc 3.9; hgb 9.3; hct 29.3 mcv 105.4 plt 105; glucose 106; bun 25; creat 1.28; k+ 3.6; na++ 142; a 9.2 liver normal albumin 3.5  07-30-20: chol 116; ldl 49; trig 71; hdl 53; hgb a1c 5.1; hep C neg  08-07-20: wbc 3.5; hgb 9.0; hct 27.9; mcv 104.5 plt 112; glucose 86; bun 27; creat 1.22; k+ 4.2; an++ 143; ca 9.0 liver normal albumin 3.2 08-14-20: wbc 2.8; hgb 9.4; hct 29.2; mcv 104.3 plt 101; glucose 71; bun 27; creat 1.41; k+ 4.3; na++ 140; ca 9.0 liver normal albumin 3.1  08-16-20: urine micro-albumin 9.3 08-21-20: wbc 3.9; hgb 9.5; hct 29.7 mcv 104.2 plt 86; glucose 86; bun 18; creat 1.09; k+ 3.7; na++ 144; ca 8.1; liver normal  albumin 3.1  09-18-20: wbc 4.4; hgb 9.6; hct 29.7 mcv 102.4 plt 105; glucose 99; bun 20; creat 1.12; k+ 3.2; na++ 142; ca 8.8 liver normal albumin 3.2   NO NEW LABS.   Review of Systems  Constitutional: Negative for malaise/fatigue.  Respiratory: Negative for cough and shortness of breath.   Cardiovascular: Negative for chest pain, palpitations and leg swelling.  Gastrointestinal: Positive for diarrhea. Negative for abdominal pain, blood in stool, heartburn, nausea  and vomiting.  Musculoskeletal: Negative for back pain, joint pain and myalgias.  Skin: Negative.   Neurological: Negative for dizziness.  Psychiatric/Behavioral: The patient is not nervous/anxious.     Physical Exam Constitutional:      General: She is not in acute distress.    Appearance: She is underweight. She is not diaphoretic.  Neck:     Thyroid: No thyromegaly.  Cardiovascular:     Rate and Rhythm: Normal rate and regular rhythm.     Heart sounds: Murmur heard.      Comments: 2/6 pedal pulses faint  Pulmonary:     Effort: Pulmonary effort is normal. No respiratory distress.     Breath sounds: Normal breath sounds.  Abdominal:     General: Bowel sounds are normal. There is no distension.     Palpations: Abdomen is soft.     Tenderness: There is no abdominal tenderness.  Musculoskeletal:        General: Normal range of motion.     Cervical back: Neck supple.     Right lower leg: No edema.     Left lower leg: No edema.  Lymphadenopathy:     Cervical: No cervical adenopathy.  Skin:    General: Skin is warm and dry.  Neurological:     Mental Status: She is alert. Mental status is at baseline.  Psychiatric:        Mood and Affect: Mood normal.     ASSESSMENT/ PLAN:  TODAY  1. Diarrhea unspecified type 2. Macrocytic anemia 3. Diabetes mellitus without complications  Will stop iron due to diarrhea Will stop novolog and cbg's Will monitor her status.   MD is aware of resident's narcotic use and  is in agreement with current plan of care. We will attempt to wean resident as appropriate.  Ok Edwards NP [facility]  Contact [phone] Monday through Friday 8am- 5pm  After hours call [phone]

## 2020-10-19 ENCOUNTER — Other Ambulatory Visit (HOSPITAL_COMMUNITY): Payer: Self-pay

## 2020-10-19 DIAGNOSIS — C9000 Multiple myeloma not having achieved remission: Secondary | ICD-10-CM

## 2020-10-19 MED ORDER — LENALIDOMIDE 10 MG PO CAPS: ORAL_CAPSULE | ORAL | 0 refills | Status: DC

## 2020-10-19 NOTE — Telephone Encounter (Signed)
Chart reviewed. Revlimid refilled per Dr. Delton Coombes

## 2020-10-24 LAB — O&P RESULT

## 2020-10-24 LAB — OVA + PARASITE EXAM

## 2020-10-25 ENCOUNTER — Non-Acute Institutional Stay (SKILLED_NURSING_FACILITY): Payer: Medicare PPO | Admitting: Adult Health

## 2020-10-25 ENCOUNTER — Encounter: Payer: Self-pay | Admitting: Adult Health

## 2020-10-25 DIAGNOSIS — E1159 Type 2 diabetes mellitus with other circulatory complications: Secondary | ICD-10-CM | POA: Diagnosis not present

## 2020-10-25 DIAGNOSIS — M810 Age-related osteoporosis without current pathological fracture: Secondary | ICD-10-CM

## 2020-10-25 DIAGNOSIS — I152 Hypertension secondary to endocrine disorders: Secondary | ICD-10-CM

## 2020-10-25 DIAGNOSIS — E119 Type 2 diabetes mellitus without complications: Secondary | ICD-10-CM | POA: Diagnosis not present

## 2020-10-25 NOTE — Progress Notes (Signed)
Location:    [facility] Room Number: 148/W Place of Service:  SNF (31)   CODE STATUS: Full Code  No Known Allergies  Chief Complaint  Patient presents with  . Medical Management of Chronic Issues         Hypertension associated with type 2 diabetes mellitus:  Diabetes mellitus without complication:  Post menopausal osteoporosis:    HPI:  She is a 74 year old long term resident of this facility being seen for the management of her chronic illnesses:  Hypertension associated with type 2 diabetes mellitus:  Diabetes mellitus without complication:  Post menopausal osteoporosis. There are no reports of pain present. No reports of agitation or anxiety. No reports of insomnia.   Past Medical History:  Diagnosis Date  . Benign hypertension   . Cataract   . 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 Diabetes mellitus without complication ([place])   . H/O partial nephrectomy   . Hypokalemia   . Hypothyroidism   . Impaired cognition   . Multiple myeloma (HCC)    Dr Maylon Peppers, [facility]  . Thyroid disease     Past Surgical History:  Procedure Laterality Date  . ABDOMINAL HYSTERECTOMY    . CRANIOTOMY     for lymphoma  . YAG LASER APPLICATION Right 08/07/2352   Procedure: YAG LASER APPLICATION;  Surgeon: [name], MD;  Location: [facility];  Service: Ophthalmology;  Laterality: Right;    Social History   Socioeconomic History  . Marital status: Single    Spouse name: Not on file  . Number of children: Not on file  . Years of education: Not on file  . Highest education level: Not on file  Occupational History  . Occupation: retired   Tobacco Use  . Smoking status: Never Smoker  . Smokeless tobacco: Never Used  Substance and Sexual Activity  . Alcohol use: No  . Drug use: No  . Sexual activity: Never  Other Topics Concern  . Not on file  Social History Narrative   Long term resident of [facility]    Social Determinants of Health   Financial Resource Strain:  Low Risk   . Difficulty of Paying Living Expenses: Not very hard  Food Insecurity: No Food Insecurity  . Worried About Charity fundraiser in the Last Year: Never true  . Ran Out of Food in the Last Year: Never true  Transportation Needs: No Transportation Needs  . Lack of Transportation (Medical): No  . Lack of Transportation (Non-Medical): No  Physical Activity: Inactive  . Days of Exercise per Week: 0 days  . Minutes of Exercise per Session: 0 min  Stress: No Stress Concern Present  . Feeling of Stress : Not at all  Social Connections: Moderately Isolated  . Frequency of Communication with Friends and Family: More than three times a week  . Frequency of Social Gatherings with Friends and Family: Once a week  . Attends Religious Services: More than 4 times per year  . Active Member of Clubs or Organizations: No  . Attends Archivist Meetings: Never  . Marital Status: Never married  Intimate Partner Violence:   . Fear of Current or Ex-Partner: Not on file  . Emotionally Abused: Not on file  . Physically Abused: Not on file  . Sexually Abused: Not on file   Family History  Problem Relation Age of Onset  . Depression Mother   . Diabetes Mother   . Heart disease Father   .  Cancer - Prostate Brother   . Cancer Brother   . Cancer Sister       VITAL SIGNS BP 90/66   Pulse 70   Temp (!) 96.9 F (36.1 C)   Ht $R'5\' 9"'bA$  (1.753 m)   Wt 110 lb 12.8 oz (50.3 kg)   BMI 16.36 kg/m   Facility-Administered Encounter Medications as of 10/25/2020  Medication  . prochlorperazine (COMPAZINE) tablet 10 mg   Outpatient Encounter Medications as of 10/25/2020  Medication Sig  . acetaminophen (TYLENOL) 500 MG tablet Take 1,000 mg by mouth every 6 (six) hours as needed for mild pain.   Marland Kitchen acyclovir (ZOVIRAX) 400 MG tablet TAKE 1 TABLET BY MOUTH TWICE DAILY  . aspirin EC 81 MG tablet Take 81 mg by mouth daily.  . calcium-vitamin D (OSCAL WITH D) 500-200 MG-UNIT tablet Take 1  tablet by mouth daily with breakfast.  . Lactobacillus Probiotic TABS Take 2 tablets by mouth daily.  Marland Kitchen lenalidomide (REVLIMID) 10 MG capsule TAKE 1 CAPSULE BY MOUTH  DAILY FOR 14 DAYS, THEN 14  DAYS OFF  . levothyroxine (SYNTHROID, LEVOTHROID) 25 MCG tablet Take 25 mcg by mouth daily before breakfast.  . loperamide (IMODIUM A-D) 2 MG tablet Take 2 mg by mouth 2 (two) times daily as needed for diarrhea or loose stools.  Marland Kitchen losartan (COZAAR) 50 MG tablet Take 1 tablet (50 mg total) by mouth daily.  . Multiple Vitamins-Minerals (MULTIVITAMIN WOMEN 50+ PO) Take by mouth daily.   . NON FORMULARY Regular Diet with Meals  . NON FORMULARY Wanderguard #1335 to ankle for safety awareness. Check placement and function qshift. Every Shift Day, Evening, Night  . Nutritional Supplements (ENSURE CLEAR PO) Take by mouth 2 (two) times daily. Due to variable meal intake, cognitive impairment and low BMI  . polycarbophil (FIBERCON) 625 MG tablet Take 1,250 mg by mouth daily.  . potassium chloride SA (KLOR-CON) 20 MEQ tablet Take 20 mEq by mouth daily.   . [DISCONTINUED] ferrous sulfate 325 (65 FE) MG tablet Take 325 mg by mouth daily with breakfast.  . [DISCONTINUED] insulin aspart (NOVOLOG FLEXPEN) 100 UNIT/ML FlexPen Inject 10 Units into the skin 3 (three) times daily with meals.     SIGNIFICANT DIAGNOSTIC EXAMS   PREVIOUS   08-10-20: t score: -3.758  NO NEW EXAMS.   LABS REVIEWED PREVIOUS    07-20-20: vit B 12: 620  07-23-20: tsh 2.264 07-24-20: wbc 3.9; hgb 9.3; hct 29.3 mcv 105.4 plt 105; glucose 106; bun 25; creat 1.28; k+ 3.6; na++ 142; a 9.2 liver normal albumin 3.5  07-30-20: chol 116; ldl 49; trig 71; hdl 53; hgb a1c 5.1; hep C neg  08-07-20: wbc 3.5; hgb 9.0; hct 27.9; mcv 104.5 plt 112; glucose 86; bun 27; creat 1.22; k+ 4.2; an++ 143; ca 9.0 liver normal albumin 3.2 08-14-20: wbc 2.8; hgb 9.4; hct 29.2; mcv 104.3 plt 101; glucose 71; bun 27; creat 1.41; k+ 4.3; na++ 140; ca 9.0 liver normal  albumin 3.1  08-16-20: urine micro-albumin 9.3 08-21-20: wbc 3.9; hgb 9.5; hct 29.7 mcv 104.2 plt 86; glucose 86; bun 18; creat 1.09; k+ 3.7; na++ 144; ca 8.1; liver normal albumin 3.1  09-18-20: wbc 4.4; hgb 9.6; hct 29.7 mcv 102.4 plt 105; glucose 99; bun 20; creat 1.12; k+ 3.2; na++ 142; ca 8.8 liver normal albumin 3.2   TODAY  10-16-20: wbc 3.9; hgb 10.2; hct 32.3; mcv 104.9 plt 95; glucose 98; bun 19; creat 1.25; k+ 3.9; na++ 139; ca 8.8  liver normal albumin 3.2   Review of Systems  Constitutional: Negative for malaise/fatigue.  Respiratory: Negative for cough and shortness of breath.   Cardiovascular: Negative for chest pain, palpitations and leg swelling.  Gastrointestinal: Negative for abdominal pain, constipation and heartburn.  Musculoskeletal: Negative for back pain, joint pain and myalgias.  Skin: Negative.   Neurological: Negative for dizziness.  Psychiatric/Behavioral: The patient is not nervous/anxious.     Physical Exam Constitutional:      General: She is not in acute distress.    Appearance: She is well-developed. She is not diaphoretic.  Neck:     Thyroid: No thyromegaly.  Cardiovascular:     Rate and Rhythm: Normal rate and regular rhythm.     Heart sounds: Murmur heard.      Comments: 2/6 pedal pulses faint  Pulmonary:     Effort: Pulmonary effort is normal. No respiratory distress.     Breath sounds: Normal breath sounds.  Abdominal:     General: Bowel sounds are normal. There is no distension.     Palpations: Abdomen is soft.     Tenderness: There is no abdominal tenderness.  Musculoskeletal:        General: Normal range of motion.     Cervical back: Neck supple.     Right lower leg: No edema.     Left lower leg: No edema.  Lymphadenopathy:     Cervical: No cervical adenopathy.  Skin:    General: Skin is warm and dry.  Neurological:     Mental Status: She is alert. Mental status is at baseline.  Psychiatric:        Mood and Affect: Mood normal.       ASSESSMENT/ PLAN:  TODAY  1. Hypertension associated with type 2 diabetes mellitus: is stable b/p 99/66 will continue cozaar 50 mg daily and asa 81 mg daily   2. Diabetes mellitus without complication: is stable hgb a1c 5.1 will monitor is on arb and asa  3. Post menopausal osteoporosis: t score -3.758 will continue calcium and vit D   PREVIOUS   4. Compression fracture of L1 vertebrae with routinely healing subsequent encounter (06/2017) is stable has prn tylenol  5. Thrombocytopenia: is stable plt 105 will monitor   6. Multiple myeloma without achieving remission: is stable is followed by oncology will continue revlimid 100 mg on 14 days off 14 days. Will monitor   7. Macrocytic anemia: is stable hgb 9.6 vit B 12: 620 will is off iron due to diarrhea    8. Vascular dementia with behavioral disturbance is stable weight is 110 pounds her aricept was stopped due to side effects   9. Herpes: no recent outbreak will continue acyclovir 400 mg twice daily    10. Failure to thrive in adult is without change: weight is 110 pounds will continue supplements as directed  11. Hypokalemia: is stable k+ 3.2will continue k+ 20 meq daily   12. Hypothyroidism  Unspecified type: is stable tsh 2.264 will continue synthroid 25 mcg daily      MD is aware of resident's narcotic use and is in agreement with current plan of care. We will attempt to wean resident as appropriate.  Ok Edwards NP [facility] Adult Medicine  Contact [phone] Monday through Friday 8am- 5pm  After hours call [phone]

## 2020-10-26 DIAGNOSIS — M2141 Flat foot [pes planus] (acquired), right foot: Secondary | ICD-10-CM | POA: Diagnosis not present

## 2020-10-26 DIAGNOSIS — Z7984 Long term (current) use of oral hypoglycemic drugs: Secondary | ICD-10-CM | POA: Diagnosis not present

## 2020-10-26 DIAGNOSIS — B351 Tinea unguium: Secondary | ICD-10-CM | POA: Diagnosis not present

## 2020-10-26 DIAGNOSIS — L603 Nail dystrophy: Secondary | ICD-10-CM | POA: Diagnosis not present

## 2020-10-26 DIAGNOSIS — E1151 Type 2 diabetes mellitus with diabetic peripheral angiopathy without gangrene: Secondary | ICD-10-CM | POA: Diagnosis not present

## 2020-10-30 ENCOUNTER — Encounter (HOSPITAL_COMMUNITY): Payer: Self-pay

## 2020-10-30 ENCOUNTER — Inpatient Hospital Stay (HOSPITAL_COMMUNITY): Payer: Medicare PPO

## 2020-10-30 VITALS — BP 130/67 | HR 64 | Temp 97.1°F | Resp 17

## 2020-10-30 DIAGNOSIS — D631 Anemia in chronic kidney disease: Secondary | ICD-10-CM | POA: Diagnosis not present

## 2020-10-30 DIAGNOSIS — E1122 Type 2 diabetes mellitus with diabetic chronic kidney disease: Secondary | ICD-10-CM | POA: Diagnosis not present

## 2020-10-30 DIAGNOSIS — C9000 Multiple myeloma not having achieved remission: Secondary | ICD-10-CM

## 2020-10-30 DIAGNOSIS — E039 Hypothyroidism, unspecified: Secondary | ICD-10-CM | POA: Diagnosis not present

## 2020-10-30 DIAGNOSIS — Z5111 Encounter for antineoplastic chemotherapy: Secondary | ICD-10-CM | POA: Diagnosis not present

## 2020-10-30 DIAGNOSIS — E1136 Type 2 diabetes mellitus with diabetic cataract: Secondary | ICD-10-CM | POA: Diagnosis not present

## 2020-10-30 DIAGNOSIS — N189 Chronic kidney disease, unspecified: Secondary | ICD-10-CM | POA: Diagnosis not present

## 2020-10-30 DIAGNOSIS — R197 Diarrhea, unspecified: Secondary | ICD-10-CM | POA: Diagnosis not present

## 2020-10-30 DIAGNOSIS — I129 Hypertensive chronic kidney disease with stage 1 through stage 4 chronic kidney disease, or unspecified chronic kidney disease: Secondary | ICD-10-CM | POA: Diagnosis not present

## 2020-10-30 LAB — COMPREHENSIVE METABOLIC PANEL
ALT: 23 U/L (ref 0–44)
AST: 33 U/L (ref 15–41)
Albumin: 3 g/dL — ABNORMAL LOW (ref 3.5–5.0)
Alkaline Phosphatase: 65 U/L (ref 38–126)
Anion gap: 8 (ref 5–15)
BUN: 21 mg/dL (ref 8–23)
CO2: 24 mmol/L (ref 22–32)
Calcium: 8.8 mg/dL — ABNORMAL LOW (ref 8.9–10.3)
Chloride: 109 mmol/L (ref 98–111)
Creatinine, Ser: 1.29 mg/dL — ABNORMAL HIGH (ref 0.44–1.00)
GFR, Estimated: 43 mL/min — ABNORMAL LOW (ref >60)
Glucose, Bld: 103 mg/dL — ABNORMAL HIGH (ref 70–99)
Potassium: 3.6 mmol/L (ref 3.5–5.1)
Sodium: 141 mmol/L (ref 135–145)
Total Bilirubin: 0.6 mg/dL (ref 0.3–1.2)
Total Protein: 6 g/dL — ABNORMAL LOW (ref 6.5–8.1)

## 2020-10-30 LAB — CBC WITH DIFFERENTIAL/PLATELET
Abs Immature Granulocytes: 0 10*3/uL (ref 0.00–0.07)
Basophils Absolute: 0.1 10*3/uL (ref 0.0–0.1)
Basophils Relative: 2 %
Eosinophils Absolute: 0.1 10*3/uL (ref 0.0–0.5)
Eosinophils Relative: 5 %
HCT: 30.3 % — ABNORMAL LOW (ref 36.0–46.0)
Hemoglobin: 9.8 g/dL — ABNORMAL LOW (ref 12.0–15.0)
Immature Granulocytes: 0 %
Lymphocytes Relative: 21 %
Lymphs Abs: 0.6 10*3/uL — ABNORMAL LOW (ref 0.7–4.0)
MCH: 33.9 pg (ref 26.0–34.0)
MCHC: 32.3 g/dL (ref 30.0–36.0)
MCV: 104.8 fL — ABNORMAL HIGH (ref 80.0–100.0)
Monocytes Absolute: 0.4 10*3/uL (ref 0.1–1.0)
Monocytes Relative: 14 %
Neutro Abs: 1.8 10*3/uL (ref 1.7–7.7)
Neutrophils Relative %: 58 %
Platelets: 127 10*3/uL — ABNORMAL LOW (ref 150–400)
RBC: 2.89 MIL/uL — ABNORMAL LOW (ref 3.87–5.11)
RDW: 15.3 % (ref 11.5–15.5)
WBC: 3 10*3/uL — ABNORMAL LOW (ref 4.0–10.5)
nRBC: 0 % (ref 0.0–0.2)

## 2020-10-30 MED ORDER — DEXAMETHASONE 4 MG PO TABS
10.0000 mg | ORAL_TABLET | Freq: Once | ORAL | Status: AC
  Administered 2020-10-30: 10 mg via ORAL

## 2020-10-30 MED ORDER — BORTEZOMIB CHEMO SQ INJECTION 3.5 MG (2.5MG/ML)
1.3000 mg/m2 | Freq: Once | INTRAMUSCULAR | Status: AC
  Administered 2020-10-30: 2 mg via SUBCUTANEOUS
  Filled 2020-10-30: qty 0.8

## 2020-10-30 MED ORDER — DEXAMETHASONE 4 MG PO TABS
ORAL_TABLET | ORAL | Status: AC
  Filled 2020-10-30: qty 3

## 2020-10-30 NOTE — Patient Instructions (Signed)
[facility] Discharge Instructions for Patients Receiving Chemotherapy   Beginning January 23rd 2017 lab work for the [facility] will be done in the  Main lab at [facility] on 1st floor. If you have a lab appointment with the [facility] please come in thru the  Main Entrance and check in at the main information desk   Today you received the following chemotherapy agents Velcade injection. Follow-up as scheduled  To help prevent nausea and vomiting after your treatment, we encourage you to take your nausea medication   If you develop nausea and vomiting, or diarrhea that is not controlled by your medication, call the clinic.  The clinic phone number is [phone]. Office hours are Monday-Friday 8:30am-5:00pm.  BELOW ARE SYMPTOMS THAT SHOULD BE REPORTED IMMEDIATELY:  *FEVER GREATER THAN 101.0 F  *CHILLS WITH OR WITHOUT FEVER  NAUSEA AND VOMITING THAT IS NOT CONTROLLED WITH YOUR NAUSEA MEDICATION  *UNUSUAL SHORTNESS OF BREATH  *UNUSUAL BRUISING OR BLEEDING  TENDERNESS IN MOUTH AND THROAT WITH OR WITHOUT PRESENCE OF ULCERS  *URINARY PROBLEMS  *BOWEL PROBLEMS  UNUSUAL RASH Items with * indicate a potential emergency and should be followed up as soon as possible. If you have an emergency after office hours please contact your primary care physician or go to the nearest emergency department.  Please call the clinic during office hours if you have any questions or concerns.   You may also contact the Patient Navigator at [phone] should you have any questions or need assistance in obtaining follow up care.      Resources For Cancer Patients and their Caregivers ? American Cancer Society: Can assist with transportation, wigs, general needs, runs Look Good Feel Better.        (470) 308-3846 ? Cancer Care: Provides financial assistance, online support groups, medication/co-pay assistance.  1-800-813-HOPE 385-619-2573) ? [place] Assists [place] cancer patients and their families through emotional , educational and financial support.  (404)107-2239 ? [place] DSS Where to apply for food stamps, Medicaid and utility assistance. 775-296-6665 ? [facility]: Transportation to medical appointments. (925)853-4863 ? Social Security Administration: May apply for disability if have a Stage IV cancer. 854-537-0110 724-859-8003 ? [employer], Disability and Transit Services: Assists with nutrition, care and transit needs. 647-400-9922

## 2020-10-30 NOTE — Progress Notes (Signed)
Amanda Wu tolerated Velcade injection well without complaints or incident. Labs reviewed prior to administering this medication. VSS Pt discharged via wheelchair in satisfactory condition accompanied by [facility] employees

## 2020-11-01 ENCOUNTER — Non-Acute Institutional Stay (SKILLED_NURSING_FACILITY): Payer: Medicare PPO | Admitting: Adult Health

## 2020-11-01 ENCOUNTER — Encounter: Payer: Self-pay | Admitting: Adult Health

## 2020-11-01 DIAGNOSIS — F0151 Vascular dementia with behavioral disturbance: Secondary | ICD-10-CM | POA: Diagnosis not present

## 2020-11-01 DIAGNOSIS — E1159 Type 2 diabetes mellitus with other circulatory complications: Secondary | ICD-10-CM

## 2020-11-01 DIAGNOSIS — F01518 Vascular dementia, unspecified severity, with other behavioral disturbance: Secondary | ICD-10-CM

## 2020-11-01 DIAGNOSIS — C9000 Multiple myeloma not having achieved remission: Secondary | ICD-10-CM

## 2020-11-01 DIAGNOSIS — I152 Hypertension secondary to endocrine disorders: Secondary | ICD-10-CM | POA: Diagnosis not present

## 2020-11-01 DIAGNOSIS — D696 Thrombocytopenia, unspecified: Secondary | ICD-10-CM

## 2020-11-01 NOTE — Progress Notes (Signed)
Location:    [facility] Room Number: 148/W Place of Service:  SNF (31)   CODE STATUS: Full Code  No Known Allergies  Chief Complaint  Patient presents with  . Acute Visit    Care Plan Meeting    HPI:  We have come together for her care plan meeting. Family present. BIMS 5/15 mood 0/30. She has not had any falls; no hospitalizations. Her weight is stable at 110.8 pounds; her admission weight was 104 pounds. She has a fair appetite; and is taking supplements. Her diarrhea has resolved since stopping her iron. She requires limited to extensive assist with her adls; feeds herself. She is frequently incontinent of bladder and bowel. There are no reports of pain present. We have discussed her code status with family at this time they request that she remain a full code. She will continue to be followed for her chronic illnesses including: Hypertension associated with type 2 diabetes mellitus  Vascular dementia with behavioral disturbance Thrombocytopenia Multiple myeloma not having achieved remission   Past Medical History:  Diagnosis Date  . Benign hypertension   . Cataract   . 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 Diabetes mellitus without complication ([place])   . H/O partial nephrectomy   . Hypokalemia   . Hypothyroidism   . Impaired cognition   . Multiple myeloma (HCC)    Dr Maylon Peppers, [facility]  . Thyroid disease     Past Surgical History:  Procedure Laterality Date  . ABDOMINAL HYSTERECTOMY    . CRANIOTOMY     for lymphoma  . YAG LASER APPLICATION Right 2/56/3893   Procedure: YAG LASER APPLICATION;  Surgeon: [name], MD;  Location: [facility];  Service: Ophthalmology;  Laterality: Right;    Social History   Socioeconomic History  . Marital status: Single    Spouse name: Not on file  . Number of children: Not on file  . Years of education: Not on file  . Highest education level: Not on file  Occupational History  . Occupation: retired    Tobacco Use  . Smoking status: Never Smoker  . Smokeless tobacco: Never Used  Substance and Sexual Activity  . Alcohol use: No  . Drug use: No  . Sexual activity: Never  Other Topics Concern  . Not on file  Social History Narrative   Long term resident of [facility]    Social Determinants of Health   Financial Resource Strain: Low Risk   . Difficulty of Paying Living Expenses: Not very hard  Food Insecurity: No Food Insecurity  . Worried About Charity fundraiser in the Last Year: Never true  . Ran Out of Food in the Last Year: Never true  Transportation Needs: No Transportation Needs  . Lack of Transportation (Medical): No  . Lack of Transportation (Non-Medical): No  Physical Activity: Inactive  . Days of Exercise per Week: 0 days  . Minutes of Exercise per Session: 0 min  Stress: No Stress Concern Present  . Feeling of Stress : Not at all  Social Connections: Moderately Isolated  . Frequency of Communication with Friends and Family: More than three times a week  . Frequency of Social Gatherings with Friends and Family: Once a week  . Attends Religious Services: More than 4 times per year  . Active Member of Clubs or Organizations: No  . Attends Archivist Meetings: Never  . Marital Status: Never married  Intimate Partner Violence:   . Fear  of Current or Ex-Partner: Not on file  . Emotionally Abused: Not on file  . Physically Abused: Not on file  . Sexually Abused: Not on file   Family History  Problem Relation Age of Onset  . Depression Mother   . Diabetes Mother   . Heart disease Father   . Cancer - Prostate Brother   . Cancer Brother   . Cancer Sister       VITAL SIGNS BP (!) 104/58   Pulse 78   Temp 98 F (36.7 C)   Resp 20   Ht _0  (1.753 m)   Wt 110 lb 12.8 oz (50.3 kg)   SpO2 97%   BMI 16.36 kg/m   Facility-Administered Encounter Medications as of 11/01/2020  Medication  . prochlorperazine (COMPAZINE) tablet 10 mg   Outpatient  Encounter Medications as of 11/01/2020  Medication Sig  . acetaminophen (TYLENOL) 500 MG tablet Take 1,000 mg by mouth every 6 (six) hours as needed for mild pain.   Marland Kitchen acyclovir (ZOVIRAX) 400 MG tablet TAKE 1 TABLET BY MOUTH TWICE DAILY  . aspirin EC 81 MG tablet Take 81 mg by mouth daily.  . calcium-vitamin D (OSCAL WITH D) 500-200 MG-UNIT tablet Take 1 tablet by mouth daily with breakfast.  . Lactobacillus Probiotic TABS Take 2 tablets by mouth daily.  Marland Kitchen lenalidomide (REVLIMID) 10 MG capsule TAKE 1 CAPSULE BY MOUTH  DAILY FOR 14 DAYS, THEN 14  DAYS OFF  . levothyroxine (SYNTHROID, LEVOTHROID) 25 MCG tablet Take 25 mcg by mouth daily before breakfast.  . loperamide (IMODIUM A-D) 2 MG tablet Take 2 mg by mouth 2 (two) times daily as needed for diarrhea or loose stools.  Marland Kitchen losartan (COZAAR) 50 MG tablet Take 1 tablet (50 mg total) by mouth daily.  . Multiple Vitamins-Minerals (MULTIVITAMIN WOMEN 50+ PO) Take by mouth daily.   . NON FORMULARY Regular Diet with Meals  . NON FORMULARY Wanderguard #1335 to ankle for safety awareness. Check placement and function qshift. Every Shift Day, Evening, Night  . Nutritional Supplements (ENSURE CLEAR PO) Take by mouth 2 (two) times daily. Due to variable meal intake, cognitive impairment and low BMI  . polycarbophil (FIBERCON) 625 MG tablet Take 1,250 mg by mouth daily.  . potassium chloride SA (KLOR-CON) 20 MEQ tablet Take 20 mEq by mouth daily.      SIGNIFICANT DIAGNOSTIC EXAMS   PREVIOUS   08-10-20: t score: -3.758  NO NEW EXAMS.   LABS REVIEWED PREVIOUS    07-20-20: vit B 12: 620  07-23-20: tsh 2.264 07-24-20: wbc 3.9; hgb 9.3; hct 29.3 mcv 105.4 plt 105; glucose 106; bun 25; creat 1.28; k+ 3.6; na++ 142; a 9.2 liver normal albumin 3.5  07-30-20: chol 116; ldl 49; trig 71; hdl 53; hgb a1c 5.1; hep C neg  08-07-20: wbc 3.5; hgb 9.0; hct 27.9; mcv 104.5 plt 112; glucose 86; bun 27; creat 1.22; k+ 4.2; an++ 143; ca 9.0 liver normal albumin  3.2 08-14-20: wbc 2.8; hgb 9.4; hct 29.2; mcv 104.3 plt 101; glucose 71; bun 27; creat 1.41; k+ 4.3; na++ 140; ca 9.0 liver normal albumin 3.1  08-16-20: urine micro-albumin 9.3 08-21-20: wbc 3.9; hgb 9.5; hct 29.7 mcv 104.2 plt 86; glucose 86; bun 18; creat 1.09; k+ 3.7; na++ 144; ca 8.1; liver normal albumin 3.1  09-18-20: wbc 4.4; hgb 9.6; hct 29.7 mcv 102.4 plt 105; glucose 99; bun 20; creat 1.12; k+ 3.2; na++ 142; ca 8.8 liver normal albumin 3.2  10-16-20: wbc  3.9; hgb 10.2; hct 32.3; mcv 104.9 plt 95; glucose 98; bun 19; creat 1.25; k+ 3.9; na++ 139; ca 8.8 liver normal albumin 3.2   NO NEW LABS.   Review of Systems  Constitutional: Negative for malaise/fatigue.  Respiratory: Negative for cough and shortness of breath.   Cardiovascular: Negative for chest pain, palpitations and leg swelling.  Gastrointestinal: Negative for abdominal pain, constipation and heartburn.  Musculoskeletal: Negative for back pain, joint pain and myalgias.  Skin: Negative.   Neurological: Negative for dizziness.  Psychiatric/Behavioral: The patient is not nervous/anxious.     Physical Exam Constitutional:      General: She is not in acute distress.    Appearance: She is underweight. She is not diaphoretic.  Neck:     Thyroid: No thyromegaly.  Cardiovascular:     Rate and Rhythm: Normal rate and regular rhythm.     Heart sounds: Murmur heard.      Comments: 2/6 pedal pulses faint  Pulmonary:     Effort: Pulmonary effort is normal. No respiratory distress.     Breath sounds: Normal breath sounds.  Abdominal:     General: Bowel sounds are normal. There is no distension.     Palpations: Abdomen is soft.     Tenderness: There is no abdominal tenderness.  Musculoskeletal:        General: Normal range of motion.     Cervical back: Neck supple.     Right lower leg: No edema.     Left lower leg: No edema.  Lymphadenopathy:     Cervical: No cervical adenopathy.  Skin:    General: Skin is warm and dry.   Neurological:     Mental Status: She is alert. Mental status is at baseline.  Psychiatric:        Mood and Affect: Mood normal.      ASSESSMENT/ PLAN:  TODAY  1. Hypertension associated with type 2 diabetes mellitus  2. Vascular dementia with behavioral disturbance 3. Thrombocytopenia 4. Multiple myeloma not having achieved remission  Will continue current medications Will continue current plan of care Will continue full code status per family request  Will continue ot monitor her status      MD is aware of resident's narcotic use and is in agreement with current plan of care. We will attempt to wean resident as appropriate.  Ok Edwards NP [facility] Adult Medicine  Contact [phone] Monday through Friday 8am- 5pm  After hours call [phone]

## 2020-11-06 ENCOUNTER — Encounter (HOSPITAL_COMMUNITY): Payer: Self-pay

## 2020-11-06 ENCOUNTER — Inpatient Hospital Stay (HOSPITAL_COMMUNITY): Payer: Medicare PPO

## 2020-11-06 ENCOUNTER — Other Ambulatory Visit: Payer: Self-pay

## 2020-11-06 VITALS — BP 121/63 | HR 78 | Temp 97.0°F | Resp 16 | Wt 109.8 lb

## 2020-11-06 DIAGNOSIS — Z5111 Encounter for antineoplastic chemotherapy: Secondary | ICD-10-CM | POA: Diagnosis not present

## 2020-11-06 DIAGNOSIS — R197 Diarrhea, unspecified: Secondary | ICD-10-CM | POA: Diagnosis not present

## 2020-11-06 DIAGNOSIS — C9000 Multiple myeloma not having achieved remission: Secondary | ICD-10-CM

## 2020-11-06 DIAGNOSIS — D631 Anemia in chronic kidney disease: Secondary | ICD-10-CM | POA: Diagnosis not present

## 2020-11-06 DIAGNOSIS — E1122 Type 2 diabetes mellitus with diabetic chronic kidney disease: Secondary | ICD-10-CM | POA: Diagnosis not present

## 2020-11-06 DIAGNOSIS — I129 Hypertensive chronic kidney disease with stage 1 through stage 4 chronic kidney disease, or unspecified chronic kidney disease: Secondary | ICD-10-CM | POA: Diagnosis not present

## 2020-11-06 DIAGNOSIS — E039 Hypothyroidism, unspecified: Secondary | ICD-10-CM | POA: Diagnosis not present

## 2020-11-06 DIAGNOSIS — E1136 Type 2 diabetes mellitus with diabetic cataract: Secondary | ICD-10-CM | POA: Diagnosis not present

## 2020-11-06 DIAGNOSIS — N189 Chronic kidney disease, unspecified: Secondary | ICD-10-CM | POA: Diagnosis not present

## 2020-11-06 LAB — COMPREHENSIVE METABOLIC PANEL
ALT: 28 U/L (ref 0–44)
AST: 36 U/L (ref 15–41)
Albumin: 3.4 g/dL — ABNORMAL LOW (ref 3.5–5.0)
Alkaline Phosphatase: 62 U/L (ref 38–126)
Anion gap: 8 (ref 5–15)
BUN: 21 mg/dL (ref 8–23)
CO2: 26 mmol/L (ref 22–32)
Calcium: 9.2 mg/dL (ref 8.9–10.3)
Chloride: 107 mmol/L (ref 98–111)
Creatinine, Ser: 1.13 mg/dL — ABNORMAL HIGH (ref 0.44–1.00)
GFR, Estimated: 50 mL/min — ABNORMAL LOW (ref >60)
Glucose, Bld: 82 mg/dL (ref 70–99)
Potassium: 3.4 mmol/L — ABNORMAL LOW (ref 3.5–5.1)
Sodium: 141 mmol/L (ref 135–145)
Total Bilirubin: 0.7 mg/dL (ref 0.3–1.2)
Total Protein: 6.6 g/dL (ref 6.5–8.1)

## 2020-11-06 LAB — CBC WITH DIFFERENTIAL/PLATELET
Abs Immature Granulocytes: 0.01 10*3/uL (ref 0.00–0.07)
Basophils Absolute: 0 10*3/uL (ref 0.0–0.1)
Basophils Relative: 1 %
Eosinophils Absolute: 0.1 10*3/uL (ref 0.0–0.5)
Eosinophils Relative: 3 %
HCT: 34.1 % — ABNORMAL LOW (ref 36.0–46.0)
Hemoglobin: 10.9 g/dL — ABNORMAL LOW (ref 12.0–15.0)
Immature Granulocytes: 0 %
Lymphocytes Relative: 18 %
Lymphs Abs: 0.6 10*3/uL — ABNORMAL LOW (ref 0.7–4.0)
MCH: 32.9 pg (ref 26.0–34.0)
MCHC: 32 g/dL (ref 30.0–36.0)
MCV: 103 fL — ABNORMAL HIGH (ref 80.0–100.0)
Monocytes Absolute: 0.6 10*3/uL (ref 0.1–1.0)
Monocytes Relative: 17 %
Neutro Abs: 2 10*3/uL (ref 1.7–7.7)
Neutrophils Relative %: 61 %
Platelets: 101 10*3/uL — ABNORMAL LOW (ref 150–400)
RBC: 3.31 MIL/uL — ABNORMAL LOW (ref 3.87–5.11)
RDW: 15.2 % (ref 11.5–15.5)
WBC: 3.3 10*3/uL — ABNORMAL LOW (ref 4.0–10.5)
nRBC: 0 % (ref 0.0–0.2)

## 2020-11-06 MED ORDER — DEXAMETHASONE 4 MG PO TABS
ORAL_TABLET | ORAL | Status: AC
  Filled 2020-11-06: qty 3

## 2020-11-06 MED ORDER — BORTEZOMIB CHEMO SQ INJECTION 3.5 MG (2.5MG/ML)
1.3000 mg/m2 | Freq: Once | INTRAMUSCULAR | Status: AC
  Administered 2020-11-06: 2 mg via SUBCUTANEOUS
  Filled 2020-11-06: qty 0.8

## 2020-11-06 MED ORDER — DEXAMETHASONE 4 MG PO TABS
10.0000 mg | ORAL_TABLET | Freq: Once | ORAL | Status: AC
  Administered 2020-11-06: 10 mg via ORAL

## 2020-11-06 NOTE — Patient Instructions (Signed)
[facility] Discharge Instructions for Patients Receiving Chemotherapy   Beginning January 23rd 2017 lab work for the [facility] will be done in the  Main lab at [facility] on 1st floor. If you have a lab appointment with the [facility] please come in thru the  Main Entrance and check in at the main information desk   Today you received the following chemotherapy agents Velcade. Follow-up as scheduled  To help prevent nausea and vomiting after your treatment, we encourage you to take your nausea medication   If you develop nausea and vomiting, or diarrhea that is not controlled by your medication, call the clinic.  The clinic phone number is [phone]. Office hours are Monday-Friday 8:30am-5:00pm.  BELOW ARE SYMPTOMS THAT SHOULD BE REPORTED IMMEDIATELY:  *FEVER GREATER THAN 101.0 F  *CHILLS WITH OR WITHOUT FEVER  NAUSEA AND VOMITING THAT IS NOT CONTROLLED WITH YOUR NAUSEA MEDICATION  *UNUSUAL SHORTNESS OF BREATH  *UNUSUAL BRUISING OR BLEEDING  TENDERNESS IN MOUTH AND THROAT WITH OR WITHOUT PRESENCE OF ULCERS  *URINARY PROBLEMS  *BOWEL PROBLEMS  UNUSUAL RASH Items with * indicate a potential emergency and should be followed up as soon as possible. If you have an emergency after office hours please contact your primary care physician or go to the nearest emergency department.  Please call the clinic during office hours if you have any questions or concerns.   You may also contact the Patient Navigator at [phone] should you have any questions or need assistance in obtaining follow up care.      Resources For Cancer Patients and their Caregivers ? American Cancer Society: Can assist with transportation, wigs, general needs, runs Look Good Feel Better.        (937)320-1436 ? Cancer Care: Provides financial assistance, online support groups, medication/co-pay assistance.  1-800-813-HOPE (914)006-8548) ? [facility] Assists [place] cancer patients and their families through emotional , educational and financial support.  (832)002-9558 ? [place] DSS Where to apply for food stamps, Medicaid and utility assistance. (978) 753-5517 ? [facility]: Transportation to medical appointments. (681)554-9851 ? Social Security Administration: May apply for disability if have a Stage IV cancer. 773-538-7126 249-620-9164 ? [employer], Disability and Transit Services: Assists with nutrition, care and transit needs. ([phone]

## 2020-11-06 NOTE — Progress Notes (Signed)
Amanda Wu tolerated Velcade injection well without complaints or incident. VSS Labs reviewed prior to administering this medication. Pt discharged via wheelchair in satisfactory condition accompanied by employees from [facility]

## 2020-11-13 ENCOUNTER — Encounter (HOSPITAL_COMMUNITY): Payer: Self-pay

## 2020-11-13 ENCOUNTER — Inpatient Hospital Stay (HOSPITAL_COMMUNITY): Payer: Medicare PPO

## 2020-11-13 ENCOUNTER — Other Ambulatory Visit: Payer: Self-pay

## 2020-11-13 VITALS — BP 99/60 | HR 79 | Temp 97.2°F | Resp 16 | Wt 110.1 lb

## 2020-11-13 DIAGNOSIS — N189 Chronic kidney disease, unspecified: Secondary | ICD-10-CM | POA: Diagnosis not present

## 2020-11-13 DIAGNOSIS — Z5111 Encounter for antineoplastic chemotherapy: Secondary | ICD-10-CM | POA: Diagnosis not present

## 2020-11-13 DIAGNOSIS — C8590 Non-Hodgkin lymphoma, unspecified, unspecified site: Secondary | ICD-10-CM | POA: Diagnosis not present

## 2020-11-13 DIAGNOSIS — E039 Hypothyroidism, unspecified: Secondary | ICD-10-CM | POA: Diagnosis not present

## 2020-11-13 DIAGNOSIS — C9000 Multiple myeloma not having achieved remission: Secondary | ICD-10-CM

## 2020-11-13 DIAGNOSIS — D631 Anemia in chronic kidney disease: Secondary | ICD-10-CM | POA: Diagnosis not present

## 2020-11-13 DIAGNOSIS — R197 Diarrhea, unspecified: Secondary | ICD-10-CM | POA: Diagnosis not present

## 2020-11-13 DIAGNOSIS — Z1159 Encounter for screening for other viral diseases: Secondary | ICD-10-CM | POA: Diagnosis not present

## 2020-11-13 DIAGNOSIS — I129 Hypertensive chronic kidney disease with stage 1 through stage 4 chronic kidney disease, or unspecified chronic kidney disease: Secondary | ICD-10-CM | POA: Diagnosis not present

## 2020-11-13 DIAGNOSIS — E1122 Type 2 diabetes mellitus with diabetic chronic kidney disease: Secondary | ICD-10-CM | POA: Diagnosis not present

## 2020-11-13 DIAGNOSIS — E1136 Type 2 diabetes mellitus with diabetic cataract: Secondary | ICD-10-CM | POA: Diagnosis not present

## 2020-11-13 LAB — CBC WITH DIFFERENTIAL/PLATELET
Abs Immature Granulocytes: 0.01 10*3/uL (ref 0.00–0.07)
Basophils Absolute: 0 10*3/uL (ref 0.0–0.1)
Basophils Relative: 1 %
Eosinophils Absolute: 0.2 10*3/uL (ref 0.0–0.5)
Eosinophils Relative: 7 %
HCT: 35.7 % — ABNORMAL LOW (ref 36.0–46.0)
Hemoglobin: 11.4 g/dL — ABNORMAL LOW (ref 12.0–15.0)
Immature Granulocytes: 0 %
Lymphocytes Relative: 19 %
Lymphs Abs: 0.7 10*3/uL (ref 0.7–4.0)
MCH: 33.4 pg (ref 26.0–34.0)
MCHC: 31.9 g/dL (ref 30.0–36.0)
MCV: 104.7 fL — ABNORMAL HIGH (ref 80.0–100.0)
Monocytes Absolute: 0.2 10*3/uL (ref 0.1–1.0)
Monocytes Relative: 6 %
Neutro Abs: 2.5 10*3/uL (ref 1.7–7.7)
Neutrophils Relative %: 67 %
Platelets: 89 10*3/uL — ABNORMAL LOW (ref 150–400)
RBC: 3.41 MIL/uL — ABNORMAL LOW (ref 3.87–5.11)
RDW: 15.4 % (ref 11.5–15.5)
WBC: 3.7 10*3/uL — ABNORMAL LOW (ref 4.0–10.5)
nRBC: 0 % (ref 0.0–0.2)

## 2020-11-13 LAB — COMPREHENSIVE METABOLIC PANEL
ALT: 33 U/L (ref 0–44)
AST: 39 U/L (ref 15–41)
Albumin: 3.3 g/dL — ABNORMAL LOW (ref 3.5–5.0)
Alkaline Phosphatase: 61 U/L (ref 38–126)
Anion gap: 8 (ref 5–15)
BUN: 29 mg/dL — ABNORMAL HIGH (ref 8–23)
CO2: 28 mmol/L (ref 22–32)
Calcium: 8.8 mg/dL — ABNORMAL LOW (ref 8.9–10.3)
Chloride: 106 mmol/L (ref 98–111)
Creatinine, Ser: 1.26 mg/dL — ABNORMAL HIGH (ref 0.44–1.00)
GFR, Estimated: 44 mL/min — ABNORMAL LOW (ref >60)
Glucose, Bld: 166 mg/dL — ABNORMAL HIGH (ref 70–99)
Potassium: 4.1 mmol/L (ref 3.5–5.1)
Sodium: 142 mmol/L (ref 135–145)
Total Bilirubin: 0.7 mg/dL (ref 0.3–1.2)
Total Protein: 6.1 g/dL — ABNORMAL LOW (ref 6.5–8.1)

## 2020-11-13 MED ORDER — DEXAMETHASONE 4 MG PO TABS
10.0000 mg | ORAL_TABLET | Freq: Once | ORAL | Status: AC
  Administered 2020-11-13: 10 mg via ORAL
  Filled 2020-11-13: qty 3

## 2020-11-13 MED ORDER — BORTEZOMIB CHEMO SQ INJECTION 3.5 MG (2.5MG/ML)
1.3000 mg/m2 | Freq: Once | INTRAMUSCULAR | Status: AC
  Administered 2020-11-13: 2 mg via SUBCUTANEOUS
  Filled 2020-11-13: qty 0.8

## 2020-11-13 NOTE — Progress Notes (Signed)
Patient tolerated Velcade injection with no complaints voiced.  Lab work reviewed.  See MAR for details.  Injection site clean and dry with no bruising or swelling noted.  Band aid applied.  VSS.  Patient left in satisfactory condition with no s/s of distress noted.  

## 2020-11-23 ENCOUNTER — Non-Acute Institutional Stay (SKILLED_NURSING_FACILITY): Payer: Medicare PPO | Admitting: Adult Health

## 2020-11-23 ENCOUNTER — Encounter: Payer: Self-pay | Admitting: Adult Health

## 2020-11-23 DIAGNOSIS — D696 Thrombocytopenia, unspecified: Secondary | ICD-10-CM | POA: Diagnosis not present

## 2020-11-23 DIAGNOSIS — C9000 Multiple myeloma not having achieved remission: Secondary | ICD-10-CM

## 2020-11-23 DIAGNOSIS — S32010D Wedge compression fracture of first lumbar vertebra, subsequent encounter for fracture with routine healing: Secondary | ICD-10-CM | POA: Diagnosis not present

## 2020-11-23 NOTE — Progress Notes (Signed)
Location:  [facility] [place] Room Number: 148/W Place of Service:  SNF (31)   CODE STATUS: Full Code  No Known Allergies  Chief Complaint  Patient presents with  . Medical Management of Chronic Issues    Routine Visit of Medical Management     Compression fracture of L1 vertebrae with routine healing subsequent encounter Thrombocytopenia:  Multiple myeloma without achieving remission     HPI:  She is a 74 year old long term resident of this facility being seen for the management of her chronic illnesses: Compression fracture of L1 vertebrae with routine healing subsequent encounter Thrombocytopenia:  Multiple myeloma without achieving remission. There are no reports of uncontrolled pain; no changes in appetite; weight is stable; no reports of anxiety or agitation.   Past Medical History:  Diagnosis Date  . Benign hypertension   . Cataract   . 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 Diabetes mellitus without complication ([place])   . H/O partial nephrectomy   . Hypokalemia   . Hypothyroidism   . Impaired cognition   . Multiple myeloma (HCC)    Dr Maylon Peppers, [facility]  . Thyroid disease     Past Surgical History:  Procedure Laterality Date  . ABDOMINAL HYSTERECTOMY    . CRANIOTOMY     for lymphoma  . YAG LASER APPLICATION Right 4/70/9628   Procedure: YAG LASER APPLICATION;  Surgeon: [name], MD;  Location: [facility];  Service: Ophthalmology;  Laterality: Right;    Social History   Socioeconomic History  . Marital status: Single    Spouse name: Not on file  . Number of children: Not on file  . Years of education: Not on file  . Highest education level: Not on file  Occupational History  . Occupation: retired   Tobacco Use  . Smoking status: Never Smoker  . Smokeless tobacco: Never Used  Substance and Sexual Activity  . Alcohol use: No  . Drug use: No  . Sexual activity: Never  Other Topics Concern  . Not on file  Social History Narrative    Long term resident of [facility]    Social Determinants of Health   Financial Resource Strain: Low Risk   . Difficulty of Paying Living Expenses: Not very hard  Food Insecurity: No Food Insecurity  . Worried About Charity fundraiser in the Last Year: Never true  . Ran Out of Food in the Last Year: Never true  Transportation Needs: No Transportation Needs  . Lack of Transportation (Medical): No  . Lack of Transportation (Non-Medical): No  Physical Activity: Inactive  . Days of Exercise per Week: 0 days  . Minutes of Exercise per Session: 0 min  Stress: No Stress Concern Present  . Feeling of Stress : Not at all  Social Connections: Moderately Isolated  . Frequency of Communication with Friends and Family: More than three times a week  . Frequency of Social Gatherings with Friends and Family: Once a week  . Attends Religious Services: More than 4 times per year  . Active Member of Clubs or Organizations: No  . Attends Archivist Meetings: Never  . Marital Status: Never married  Intimate Partner Violence: Not on file   Family History  Problem Relation Age of Onset  . Depression Mother   . Diabetes Mother   . Heart disease Father   . Cancer - Prostate Brother   . Cancer Brother   . Cancer Sister  VITAL SIGNS BP 104/84   Pulse 77   Temp 98 F (36.7 C)   Ht '5\' 9"'  (1.753 m)   Wt 109 lb 12.8 oz (49.8 kg)   BMI 16.21 kg/m   Facility-Administered Encounter Medications as of 11/23/2020  Medication  . prochlorperazine (COMPAZINE) tablet 10 mg   Outpatient Encounter Medications as of 11/23/2020  Medication Sig  . acetaminophen (TYLENOL) 500 MG tablet Take 1,000 mg by mouth every 6 (six) hours as needed for mild pain.   Marland Kitchen acyclovir (ZOVIRAX) 400 MG tablet TAKE 1 TABLET BY MOUTH TWICE DAILY  . aspirin EC 81 MG tablet Take 81 mg by mouth daily.  . calcium-vitamin D (OSCAL WITH D) 500-200 MG-UNIT tablet Take 1 tablet by mouth daily with breakfast.  . Lactobacillus  Probiotic TABS Take 2 tablets by mouth daily.  Marland Kitchen lenalidomide (REVLIMID) 10 MG capsule TAKE 1 CAPSULE BY MOUTH  DAILY FOR 14 DAYS, THEN 14  DAYS OFF  . levothyroxine (SYNTHROID, LEVOTHROID) 25 MCG tablet Take 25 mcg by mouth daily before breakfast.  . loperamide (IMODIUM A-D) 2 MG tablet Take 2 mg by mouth 2 (two) times daily as needed for diarrhea or loose stools.  Marland Kitchen losartan (COZAAR) 50 MG tablet Take 1 tablet (50 mg total) by mouth daily.  . Multiple Vitamins-Minerals (MULTIVITAMIN WOMEN 50+ PO) Take by mouth daily.   . NON FORMULARY Regular Diet with Meals  . NON FORMULARY Wanderguard #1335 to ankle for safety awareness. Check placement and function qshift. Every Shift Day, Evening, Night  . Nutritional Supplements (ENSURE CLEAR PO) Take by mouth 2 (two) times daily. Due to variable meal intake, cognitive impairment and low BMI  . potassium chloride SA (KLOR-CON) 20 MEQ tablet Take 20 mEq by mouth daily.   . [DISCONTINUED] polycarbophil (FIBERCON) 625 MG tablet Take 1,250 mg by mouth daily.     SIGNIFICANT DIAGNOSTIC EXAMS   PREVIOUS   08-10-20: t score: -3.758  NO NEW EXAMS.   LABS REVIEWED PREVIOUS    07-20-20: vit B 12: 620  07-23-20: tsh 2.264 07-24-20: wbc 3.9; hgb 9.3; hct 29.3 mcv 105.4 plt 105; glucose 106; bun 25; creat 1.28; k+ 3.6; na++ 142; a 9.2 liver normal albumin 3.5  07-30-20: chol 116; ldl 49; trig 71; hdl 53; hgb a1c 5.1; hep C neg  08-07-20: wbc 3.5; hgb 9.0; hct 27.9; mcv 104.5 plt 112; glucose 86; bun 27; creat 1.22; k+ 4.2; an++ 143; ca 9.0 liver normal albumin 3.2 08-14-20: wbc 2.8; hgb 9.4; hct 29.2; mcv 104.3 plt 101; glucose 71; bun 27; creat 1.41; k+ 4.3; na++ 140; ca 9.0 liver normal albumin 3.1  08-16-20: urine micro-albumin 9.3 08-21-20: wbc 3.9; hgb 9.5; hct 29.7 mcv 104.2 plt 86; glucose 86; bun 18; creat 1.09; k+ 3.7; na++ 144; ca 8.1; liver normal albumin 3.1  09-18-20: wbc 4.4; hgb 9.6; hct 29.7 mcv 102.4 plt 105; glucose 99; bun 20; creat 1.12; k+ 3.2;  na++ 142; ca 8.8 liver normal albumin 3.2  10-16-20: wbc 3.9; hgb 10.2; hct 32.3; mcv 104.9 plt 95; glucose 98; bun 19; creat 1.25; k+ 3.9; na++ 139; ca 8.8 liver normal albumin 3.2   NO NEW LABS.   Review of Systems  Constitutional: Negative for malaise/fatigue.  Respiratory: Negative for cough and shortness of breath.   Cardiovascular: Negative for chest pain, palpitations and leg swelling.  Gastrointestinal: Negative for abdominal pain, constipation and heartburn.  Musculoskeletal: Negative for back pain, joint pain and myalgias.  Skin: Negative.  Neurological: Negative for dizziness.  Psychiatric/Behavioral: The patient is not nervous/anxious.     Physical Exam Constitutional:      General: She is not in acute distress.    Appearance: She is well-developed and well-nourished. She is not diaphoretic.  Neck:     Thyroid: No thyromegaly.  Cardiovascular:     Rate and Rhythm: Normal rate and regular rhythm.     Pulses: Intact distal pulses.     Heart sounds: Murmur heard.      Comments: 2/6 pedal pulses faint  Pulmonary:     Effort: Pulmonary effort is normal. No respiratory distress.     Breath sounds: Normal breath sounds.  Abdominal:     General: Bowel sounds are normal. There is no distension.     Palpations: Abdomen is soft.     Tenderness: There is no abdominal tenderness.  Musculoskeletal:        General: No edema. Normal range of motion.     Cervical back: Neck supple.     Right lower leg: No edema.     Left lower leg: No edema.  Lymphadenopathy:     Cervical: No cervical adenopathy.  Skin:    General: Skin is warm and dry.  Neurological:     Mental Status: She is alert. Mental status is at baseline.  Psychiatric:        Mood and Affect: Mood and affect and mood normal.       ASSESSMENT/ PLAN:  TODAY  1. Compression fracture of L1 vertebrae with routine healing subsequent encounter (06/2017) is stable has prn tylenol   2. Thrombocytopenia: is stable  plt 105 will monitor   3. Multiple myeloma without achieving remission: is without change: is followed by oncology will ocnintue revlimid 100 mg on 14 days and off 14 days. Will monitor   PREVIOUS   4. Macrocytic anemia: is stable hgb 9.6 vit B 12: 620 will is off iron due to diarrhea    5. Vascular dementia with behavioral disturbance is stable weight is 113 pounds her aricept was stopped due to side effects   6. Herpes: no recent outbreak will continue acyclovir 400 mg twice daily    7. Failure to thrive in adult is without change: weight is 113 pounds will continue supplements as directed  8. Hypokalemia: is stable k+ 3.2will continue k+ 20 meq daily   9. Hypothyroidism  Unspecified type: is stable tsh 2.264 will continue synthroid 25 mcg daily   10. Hypertension associated with type 2 diabetes mellitus: is stable b/p 104/84 will continue cozaar 50 mg daily and asa 81 mg daily   11. Diabetes mellitus without complication: is stable hgb a1c 5.1 will monitor is on arb and asa  12. Post menopausal osteoporosis: t score -3.758 will continue calcium and vit D    MD is aware of resident's narcotic use and is in agreement with current plan of care. We will attempt to wean resident as appropriate.  Ok Edwards NP [facility] Adult Medicine  Contact [phone] Monday through Friday 8am- 5pm  After hours call [phone]

## 2020-11-25 ENCOUNTER — Other Ambulatory Visit (HOSPITAL_COMMUNITY): Payer: Self-pay | Admitting: Hematology

## 2020-11-25 DIAGNOSIS — C9000 Multiple myeloma not having achieved remission: Secondary | ICD-10-CM

## 2020-11-26 ENCOUNTER — Encounter: Payer: Self-pay | Admitting: Adult Health

## 2020-11-26 NOTE — Progress Notes (Signed)
Location:  [place] Room Number: 148/W Place of Service:  SNF (31)   CODE STATUS: Full Code  No Known Allergies  Chief Complaint  Patient presents with  . Acute Visit    Diarrhea     HPI:    Past Medical History:  Diagnosis Date  . Benign hypertension   . Cataract   . 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 Diabetes mellitus without complication ([place])   . H/O partial nephrectomy   . Hypokalemia   . Hypothyroidism   . Impaired cognition   . Multiple myeloma (HCC)    Dr Maylon Peppers, [facility]  . Thyroid disease     Past Surgical History:  Procedure Laterality Date  . ABDOMINAL HYSTERECTOMY    . CRANIOTOMY     for lymphoma  . YAG LASER APPLICATION Right 07/17/2121   Procedure: YAG LASER APPLICATION;  Surgeon: [name], MD;  Location: [facility];  Service: Ophthalmology;  Laterality: Right;    Social History   Socioeconomic History  . Marital status: Single    Spouse name: Not on file  . Number of children: Not on file  . Years of education: Not on file  . Highest education level: Not on file  Occupational History  . Occupation: retired   Tobacco Use  . Smoking status: Never Smoker  . Smokeless tobacco: Never Used  Substance and Sexual Activity  . Alcohol use: No  . Drug use: No  . Sexual activity: Never  Other Topics Concern  . Not on file  Social History Narrative   Long term resident of [facility]    Social Determinants of Health   Financial Resource Strain: Low Risk   . Difficulty of Paying Living Expenses: Not very hard  Food Insecurity: No Food Insecurity  . Worried About Charity fundraiser in the Last Year: Never true  . Ran Out of Food in the Last Year: Never true  Transportation Needs: No Transportation Needs  . Lack of Transportation (Medical): No  . Lack of Transportation (Non-Medical): No  Physical Activity: Inactive  . Days of Exercise per Week: 0 days  . Minutes of Exercise per Session: 0 min  Stress: No Stress  Concern Present  . Feeling of Stress : Not at all  Social Connections: Moderately Isolated  . Frequency of Communication with Friends and Family: More than three times a week  . Frequency of Social Gatherings with Friends and Family: Once a week  . Attends Religious Services: More than 4 times per year  . Active Member of Clubs or Organizations: No  . Attends Archivist Meetings: Never  . Marital Status: Never married  Intimate Partner Violence: Not on file   Family History  Problem Relation Age of Onset  . Depression Mother   . Diabetes Mother   . Heart disease Father   . Cancer - Prostate Brother   . Cancer Brother   . Cancer Sister       VITAL SIGNS BP 124/66   Pulse 70   Temp 98 F (36.7 C)   Ht '5\' 9"'  (1.753 m)   Wt 109 lb 12.8 oz (49.8 kg)   BMI 16.21 kg/m   Facility-Administered Encounter Medications as of 11/26/2020  Medication  . prochlorperazine (COMPAZINE) tablet 10 mg   Outpatient Encounter Medications as of 11/26/2020  Medication Sig  . acetaminophen (TYLENOL) 500 MG tablet Take 1,000 mg by mouth every 6 (six) hours as needed for mild pain.   Marland Kitchen  acyclovir (ZOVIRAX) 400 MG tablet TAKE 1 TABLET BY MOUTH TWICE DAILY  . aspirin EC 81 MG tablet Take 81 mg by mouth daily.  . calcium-vitamin D (OSCAL WITH D) 500-200 MG-UNIT tablet Take 1 tablet by mouth daily with breakfast.  . Lactobacillus Probiotic TABS Take 2 tablets by mouth daily.  Marland Kitchen lenalidomide (REVLIMID) 10 MG capsule TAKE 1 CAPSULE BY MOUTH  DAILY FOR 14 DAYS, THEN 14  DAYS OFF  . levothyroxine (SYNTHROID, LEVOTHROID) 25 MCG tablet Take 25 mcg by mouth daily before breakfast.  . loperamide (IMODIUM A-D) 2 MG tablet Take 4 mg by mouth 2 (two) times daily as needed for diarrhea or loose stools.  Marland Kitchen losartan (COZAAR) 50 MG tablet Take 1 tablet (50 mg total) by mouth daily.  . Multiple Vitamins-Minerals (MULTIVITAMIN WOMEN 50+ PO) Take by mouth daily.   . NON FORMULARY Regular Diet with Meals  .  NON FORMULARY Wanderguard #1335 to ankle for safety awareness. Check placement and function qshift. Every Shift Day, Evening, Night  . Nutritional Supplements (ENSURE CLEAR PO) Take by mouth 2 (two) times daily. Due to variable meal intake, cognitive impairment and low BMI  . potassium chloride SA (KLOR-CON) 20 MEQ tablet Take 20 mEq by mouth daily.      SIGNIFICANT DIAGNOSTIC EXAMS       ASSESSMENT/ PLAN:    MD is aware of resident's narcotic use and is in agreement with current plan of care. We will attempt to wean resident as appropriate.  Ok Edwards NP [facility] Adult [facility]  Contact [phone] Monday through Friday 8am- 5pm  After hours call [phone]

## 2020-11-26 NOTE — Telephone Encounter (Signed)
Chart reviewed. Revlimid refilled per Dr. Delton Coombes.

## 2020-11-27 ENCOUNTER — Other Ambulatory Visit: Payer: Self-pay

## 2020-11-27 ENCOUNTER — Inpatient Hospital Stay (HOSPITAL_COMMUNITY): Payer: Medicare PPO

## 2020-11-27 ENCOUNTER — Inpatient Hospital Stay (HOSPITAL_COMMUNITY): Payer: Medicare PPO | Attending: Hematology

## 2020-11-27 VITALS — BP 146/83 | HR 60 | Temp 97.0°F | Resp 17 | Wt 113.9 lb

## 2020-11-27 DIAGNOSIS — Z5112 Encounter for antineoplastic immunotherapy: Secondary | ICD-10-CM | POA: Insufficient documentation

## 2020-11-27 DIAGNOSIS — C9000 Multiple myeloma not having achieved remission: Secondary | ICD-10-CM

## 2020-11-27 LAB — CBC WITH DIFFERENTIAL/PLATELET
Basophils Absolute: 0.1 10*3/uL (ref 0.0–0.1)
Basophils Relative: 1 %
Eosinophils Absolute: 0.2 10*3/uL (ref 0.0–0.5)
Eosinophils Relative: 5 %
HCT: 32.8 % — ABNORMAL LOW (ref 36.0–46.0)
Hemoglobin: 10.6 g/dL — ABNORMAL LOW (ref 12.0–15.0)
Lymphocytes Relative: 11 %
Lymphs Abs: 0.5 10*3/uL — ABNORMAL LOW (ref 0.7–4.0)
MCH: 33.3 pg (ref 26.0–34.0)
MCHC: 32.3 g/dL (ref 30.0–36.0)
MCV: 103.1 fL — ABNORMAL HIGH (ref 80.0–100.0)
Monocytes Absolute: 0.5 10*3/uL (ref 0.1–1.0)
Monocytes Relative: 10 %
Neutro Abs: 3.4 10*3/uL (ref 1.7–7.7)
Neutrophils Relative %: 73 %
Platelets: 118 10*3/uL — ABNORMAL LOW (ref 150–400)
RBC: 3.18 MIL/uL — ABNORMAL LOW (ref 3.87–5.11)
RDW: 15 % (ref 11.5–15.5)
WBC: 4.7 10*3/uL (ref 4.0–10.5)
nRBC: 0 % (ref 0.0–0.2)
nRBC: 0 /100 WBC

## 2020-11-27 LAB — COMPREHENSIVE METABOLIC PANEL
ALT: 25 U/L (ref 0–44)
AST: 33 U/L (ref 15–41)
Albumin: 3.4 g/dL — ABNORMAL LOW (ref 3.5–5.0)
Alkaline Phosphatase: 66 U/L (ref 38–126)
Anion gap: 7 (ref 5–15)
BUN: 26 mg/dL — ABNORMAL HIGH (ref 8–23)
CO2: 24 mmol/L (ref 22–32)
Calcium: 9 mg/dL (ref 8.9–10.3)
Chloride: 109 mmol/L (ref 98–111)
Creatinine, Ser: 1.23 mg/dL — ABNORMAL HIGH (ref 0.44–1.00)
GFR, Estimated: 46 mL/min — ABNORMAL LOW (ref >60)
Glucose, Bld: 118 mg/dL — ABNORMAL HIGH (ref 70–99)
Potassium: 4 mmol/L (ref 3.5–5.1)
Sodium: 140 mmol/L (ref 135–145)
Total Bilirubin: 0.6 mg/dL (ref 0.3–1.2)
Total Protein: 6.5 g/dL (ref 6.5–8.1)

## 2020-11-27 MED ORDER — BORTEZOMIB CHEMO SQ INJECTION 3.5 MG (2.5MG/ML)
1.3000 mg/m2 | Freq: Once | INTRAMUSCULAR | Status: AC
  Administered 2020-11-27: 2 mg via SUBCUTANEOUS
  Filled 2020-11-27: qty 0.8

## 2020-11-27 MED ORDER — DEXAMETHASONE 4 MG PO TABS
ORAL_TABLET | ORAL | Status: AC
  Filled 2020-11-27: qty 3

## 2020-11-27 MED ORDER — DEXAMETHASONE 4 MG PO TABS
10.0000 mg | ORAL_TABLET | Freq: Once | ORAL | Status: AC
  Administered 2020-11-27: 10 mg via ORAL

## 2020-11-27 NOTE — Progress Notes (Signed)
Presents today for Velcade injection.  Vital signs stable.  Patient had no new complaints.    Velcade injection given in the abdomen three inches to the left of the navel.  Injection site WNL.  Patient tolerated well.  Velcade injection given today per MD orders.  Tolerated infusion without adverse affects.  Vital signs stable.  No complaints at this time.  Discharge from clinic via wheelchair in stable condition.  Alert and oriented X 3.  Follow up with [facility] as scheduled.

## 2020-11-27 NOTE — Patient Instructions (Signed)
[facility] Discharge Instructions for Patients Receiving Chemotherapy  Today you received the following chemotherapy agents   To help prevent nausea and vomiting after your treatment, we encourage you to take your nausea medication   If you develop nausea and vomiting that is not controlled by your nausea medication, call the clinic.   BELOW ARE SYMPTOMS THAT SHOULD BE REPORTED IMMEDIATELY:  *FEVER GREATER THAN 100.5 F  *CHILLS WITH OR WITHOUT FEVER  NAUSEA AND VOMITING THAT IS NOT CONTROLLED WITH YOUR NAUSEA MEDICATION  *UNUSUAL SHORTNESS OF BREATH  *UNUSUAL BRUISING OR BLEEDING  TENDERNESS IN MOUTH AND THROAT WITH OR WITHOUT PRESENCE OF ULCERS  *URINARY PROBLEMS  *BOWEL PROBLEMS  UNUSUAL RASH Items with * indicate a potential emergency and should be followed up as soon as possible.  Feel free to call the clinic should you have any questions or concerns. The clinic phone number is [phone].  Please show the CHEMO ALERT CARD at check-in to the Emergency Department and triage nurse.   

## 2020-11-28 NOTE — Progress Notes (Signed)
This encounter was created in error - please disregard.

## 2020-12-04 ENCOUNTER — Inpatient Hospital Stay (HOSPITAL_COMMUNITY): Payer: Medicare PPO

## 2020-12-04 ENCOUNTER — Encounter (HOSPITAL_COMMUNITY): Payer: Self-pay

## 2020-12-04 ENCOUNTER — Other Ambulatory Visit: Payer: Self-pay

## 2020-12-04 VITALS — BP 122/62 | HR 75 | Temp 96.8°F | Resp 17 | Wt 110.8 lb

## 2020-12-04 DIAGNOSIS — C9000 Multiple myeloma not having achieved remission: Secondary | ICD-10-CM

## 2020-12-04 DIAGNOSIS — Z5112 Encounter for antineoplastic immunotherapy: Secondary | ICD-10-CM | POA: Diagnosis not present

## 2020-12-04 LAB — CBC WITH DIFFERENTIAL/PLATELET
Abs Immature Granulocytes: 0.01 10*3/uL (ref 0.00–0.07)
Basophils Absolute: 0 10*3/uL (ref 0.0–0.1)
Basophils Relative: 1 %
Eosinophils Absolute: 0.1 10*3/uL (ref 0.0–0.5)
Eosinophils Relative: 4 %
HCT: 34.6 % — ABNORMAL LOW (ref 36.0–46.0)
Hemoglobin: 10.9 g/dL — ABNORMAL LOW (ref 12.0–15.0)
Immature Granulocytes: 0 %
Lymphocytes Relative: 21 %
Lymphs Abs: 0.6 10*3/uL — ABNORMAL LOW (ref 0.7–4.0)
MCH: 32.4 pg (ref 26.0–34.0)
MCHC: 31.5 g/dL (ref 30.0–36.0)
MCV: 103 fL — ABNORMAL HIGH (ref 80.0–100.0)
Monocytes Absolute: 0.4 10*3/uL (ref 0.1–1.0)
Monocytes Relative: 12 %
Neutro Abs: 1.9 10*3/uL (ref 1.7–7.7)
Neutrophils Relative %: 62 %
Platelets: 130 10*3/uL — ABNORMAL LOW (ref 150–400)
RBC: 3.36 MIL/uL — ABNORMAL LOW (ref 3.87–5.11)
RDW: 15.6 % — ABNORMAL HIGH (ref 11.5–15.5)
WBC: 3.1 10*3/uL — ABNORMAL LOW (ref 4.0–10.5)
nRBC: 0 % (ref 0.0–0.2)

## 2020-12-04 LAB — COMPREHENSIVE METABOLIC PANEL
ALT: 26 U/L (ref 0–44)
AST: 31 U/L (ref 15–41)
Albumin: 3.5 g/dL (ref 3.5–5.0)
Alkaline Phosphatase: 56 U/L (ref 38–126)
Anion gap: 11 (ref 5–15)
BUN: 27 mg/dL — ABNORMAL HIGH (ref 8–23)
CO2: 24 mmol/L (ref 22–32)
Calcium: 9.3 mg/dL (ref 8.9–10.3)
Chloride: 108 mmol/L (ref 98–111)
Creatinine, Ser: 1.35 mg/dL — ABNORMAL HIGH (ref 0.44–1.00)
GFR, Estimated: 41 mL/min — ABNORMAL LOW (ref >60)
Glucose, Bld: 132 mg/dL — ABNORMAL HIGH (ref 70–99)
Potassium: 3.6 mmol/L (ref 3.5–5.1)
Sodium: 143 mmol/L (ref 135–145)
Total Bilirubin: 0.6 mg/dL (ref 0.3–1.2)
Total Protein: 6.7 g/dL (ref 6.5–8.1)

## 2020-12-04 MED ORDER — DEXAMETHASONE 4 MG PO TABS
10.0000 mg | ORAL_TABLET | Freq: Once | ORAL | Status: AC
  Administered 2020-12-04: 10 mg via ORAL
  Filled 2020-12-04: qty 3

## 2020-12-04 MED ORDER — BORTEZOMIB CHEMO SQ INJECTION 3.5 MG (2.5MG/ML)
1.3000 mg/m2 | Freq: Once | INTRAMUSCULAR | Status: AC
  Administered 2020-12-04: 2 mg via SUBCUTANEOUS
  Filled 2020-12-04: qty 0.8

## 2020-12-04 NOTE — Patient Instructions (Signed)
[facility] Discharge Instructions for Patients Receiving Chemotherapy   Beginning January 23rd 2017 lab work for the Cancer Center will be done in the  Main lab at [place] on 1st floor. If you have a lab appointment with the Cancer Center please come in thru the  Main Entrance and check in at the main information desk   Today you received the following chemotherapy agents Velcade injection. Follow-up as scheduled  To help prevent nausea and vomiting after your treatment, we encourage you to take your nausea medication   If you develop nausea and vomiting, or diarrhea that is not controlled by your medication, call the clinic.  The clinic phone number is [phone]. Office hours are Monday-Friday 8:30am-5:00pm.  BELOW ARE SYMPTOMS THAT SHOULD BE REPORTED IMMEDIATELY:  *FEVER GREATER THAN 101.0 F  *CHILLS WITH OR WITHOUT FEVER  NAUSEA AND VOMITING THAT IS NOT CONTROLLED WITH YOUR NAUSEA MEDICATION  *UNUSUAL SHORTNESS OF BREATH  *UNUSUAL BRUISING OR BLEEDING  TENDERNESS IN MOUTH AND THROAT WITH OR WITHOUT PRESENCE OF ULCERS  *URINARY PROBLEMS  *BOWEL PROBLEMS  UNUSUAL RASH Items with * indicate a potential emergency and should be followed up as soon as possible. If you have an emergency after office hours please contact your primary care physician or go to the nearest emergency department.  Please call the clinic during office hours if you have any questions or concerns.   You may also contact the Patient Navigator at [phone] should you have any questions or need assistance in obtaining follow up care.      Resources For Cancer Patients and their Caregivers ? American Cancer Society: Can assist with transportation, wigs, general needs, runs Look Good Feel Better.        1-888-227-6333 ? Cancer Care: Provides financial assistance, online support groups, medication/co-pay assistance.  1-800-813-HOPE (4673) ? [facility] Assists [place] cancer patients and their families through emotional , educational and financial support.  336-427-4357 ? [place] DSS Where to apply for food stamps, Medicaid and utility assistance. 336-342-1394 ? [facility]: Transportation to medical appointments. 336-347-2287 ? Social Security Administration: May apply for disability if have a Stage IV cancer. 336-342-7796 1-800-772-1213 ? [place] Aging, Disability and Transit Services: Assists with nutrition, care and transit needs. 336-349-2343         

## 2020-12-04 NOTE — Progress Notes (Signed)
Amanda Wu tolerated Velcade injection well without complaints or incident. Labs reviewed prior to administering this medication. VSS Pt discharged via wheelchair in satisfactory condition

## 2020-12-05 ENCOUNTER — Encounter: Payer: Self-pay | Admitting: Gastroenterology

## 2020-12-05 ENCOUNTER — Ambulatory Visit (INDEPENDENT_AMBULATORY_CARE_PROVIDER_SITE_OTHER): Payer: Medicare PPO | Admitting: Gastroenterology

## 2020-12-05 ENCOUNTER — Non-Acute Institutional Stay (SKILLED_NURSING_FACILITY): Payer: Medicare PPO | Admitting: Adult Health

## 2020-12-05 ENCOUNTER — Encounter: Payer: Self-pay | Admitting: Adult Health

## 2020-12-05 VITALS — BP 135/71 | HR 44 | Temp 97.1°F | Ht 69.0 in | Wt 112.2 lb

## 2020-12-05 DIAGNOSIS — R001 Bradycardia, unspecified: Secondary | ICD-10-CM | POA: Diagnosis not present

## 2020-12-05 DIAGNOSIS — R197 Diarrhea, unspecified: Secondary | ICD-10-CM | POA: Diagnosis not present

## 2020-12-05 DIAGNOSIS — I440 Atrioventricular block, first degree: Secondary | ICD-10-CM | POA: Diagnosis not present

## 2020-12-05 NOTE — Progress Notes (Signed)
Location:  [place] Room Number: 148-W Place of Service:  SNF (31)   CODE STATUS: Full Code  Allergies  Allergen Reactions  . Lotensin [Benazepril]     Chief Complaint  Patient presents with  . Acute Visit    Acute Bradycardia     HPI:  She was found by random exam to have bradycardia. There are no reports of chest pain; no shortness of breath; no dizziness. Her blood pressure is stable EKG shows sinus bradycardia with first degree AV block; pvc pac.   Past Medical History:  Diagnosis Date  . Benign hypertension   . Cataract   . 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 Diabetes mellitus without complication ([place])   . H/O partial nephrectomy   . Hypokalemia   . Hypothyroidism   . Impaired cognition   . Multiple myeloma (HCC)    Dr Maylon Peppers, [facility]  . Thyroid disease     Past Surgical History:  Procedure Laterality Date  . ABDOMINAL HYSTERECTOMY    . CRANIOTOMY     for lymphoma  . YAG LASER APPLICATION Right 04/30/16   Procedure: YAG LASER APPLICATION;  Surgeon: [name], MD;  Location: [facility];  Service: Ophthalmology;  Laterality: Right;    Social History   Socioeconomic History  . Marital status: Single    Spouse name: Not on file  . Number of children: Not on file  . Years of education: Not on file  . Highest education level: Not on file  Occupational History  . Occupation: retired   Tobacco Use  . Smoking status: Never Smoker  . Smokeless tobacco: Never Used  Vaping Use  . Vaping Use: Never used  Substance and Sexual Activity  . Alcohol use: No  . Drug use: No  . Sexual activity: Never  Other Topics Concern  . Not on file  Social History Narrative   Long term resident of [facility]    Social Determinants of Health   Financial Resource Strain: Low Risk   . Difficulty of Paying Living Expenses: Not very hard  Food Insecurity: No Food Insecurity  . Worried About Charity fundraiser in the Last Year: Never true  . Ran  Out of Food in the Last Year: Never true  Transportation Needs: No Transportation Needs  . Lack of Transportation (Medical): No  . Lack of Transportation (Non-Medical): No  Physical Activity: Inactive  . Days of Exercise per Week: 0 days  . Minutes of Exercise per Session: 0 min  Stress: No Stress Concern Present  . Feeling of Stress : Not at all  Social Connections: Moderately Isolated  . Frequency of Communication with Friends and Family: More than three times a week  . Frequency of Social Gatherings with Friends and Family: Once a week  . Attends Religious Services: More than 4 times per year  . Active Member of Clubs or Organizations: No  . Attends Archivist Meetings: Never  . Marital Status: Never married  Intimate Partner Violence: Not on file   Family History  Problem Relation Age of Onset  . Depression Mother   . Diabetes Mother   . Heart disease Father   . Cancer - Prostate Brother   . Cancer Brother   . Cancer Sister       VITAL SIGNS BP 140/76   Pulse 76   Temp 98 F (36.7 C)   Resp 20   Ht '5\' 9"'  (1.753 m)   Wt  109 lb 12.8 oz (49.8 kg)   SpO2 97%   BMI 16.21 kg/m   Facility-Administered Encounter Medications as of 12/05/2020  Medication  . prochlorperazine (COMPAZINE) tablet 10 mg   Outpatient Encounter Medications as of 12/05/2020  Medication Sig  . acetaminophen (TYLENOL) 500 MG tablet Take 1,000 mg by mouth every 6 (six) hours as needed for mild pain.   Marland Kitchen acyclovir (ZOVIRAX) 400 MG tablet TAKE 1 TABLET BY MOUTH TWICE DAILY  . aspirin EC 81 MG tablet Take 81 mg by mouth daily.  . calcium-vitamin D (OSCAL WITH D) 500-200 MG-UNIT tablet Take 1 tablet by mouth daily with breakfast.  . Lactobacillus Probiotic TABS Take 2 tablets by mouth daily.  Marland Kitchen lenalidomide (REVLIMID) 10 MG capsule TAKE 1 CAPSULE BY MOUTH  DAILY FOR 14 DAYS, THEN 14  DAYS OFF  . levothyroxine (SYNTHROID, LEVOTHROID) 25 MCG tablet Take 25 mcg by mouth daily before breakfast.   . loperamide (IMODIUM A-D) 2 MG tablet Take 4 mg by mouth 2 (two) times daily as needed for diarrhea or loose stools.  Marland Kitchen losartan (COZAAR) 50 MG tablet Take 1 tablet (50 mg total) by mouth daily.  . Multiple Vitamins-Minerals (MULTIVITAMIN WOMEN 50+ PO) Take by mouth daily.   . NON FORMULARY Regular Diet with Meals  . NON FORMULARY Wanderguard #1335 to ankle for safety awareness. Check placement and function qshift. Every Shift Day, Evening, Night  . potassium chloride SA (KLOR-CON) 20 MEQ tablet Take 20 mEq by mouth daily.   . [DISCONTINUED] Nutritional Supplements (ENSURE CLEAR PO) Take by mouth 2 (two) times daily. Due to variable meal intake, cognitive impairment and low BMI     SIGNIFICANT DIAGNOSTIC EXAMS  PREVIOUS   08-10-20: t score: -3.758  TODAY  12-05-20: EKG: sinus bradycardia with first degree AV block pvc pac   LABS REVIEWED PREVIOUS    07-20-20: vit B 12: 620  07-23-20: tsh 2.264 07-24-20: wbc 3.9; hgb 9.3; hct 29.3 mcv 105.4 plt 105; glucose 106; bun 25; creat 1.28; k+ 3.6; na++ 142; a 9.2 liver normal albumin 3.5  07-30-20: chol 116; ldl 49; trig 71; hdl 53; hgb a1c 5.1; hep C neg  08-07-20: wbc 3.5; hgb 9.0; hct 27.9; mcv 104.5 plt 112; glucose 86; bun 27; creat 1.22; k+ 4.2; an++ 143; ca 9.0 liver normal albumin 3.2 08-14-20: wbc 2.8; hgb 9.4; hct 29.2; mcv 104.3 plt 101; glucose 71; bun 27; creat 1.41; k+ 4.3; na++ 140; ca 9.0 liver normal albumin 3.1  08-16-20: urine micro-albumin 9.3 08-21-20: wbc 3.9; hgb 9.5; hct 29.7 mcv 104.2 plt 86; glucose 86; bun 18; creat 1.09; k+ 3.7; na++ 144; ca 8.1; liver normal albumin 3.1  09-18-20: wbc 4.4; hgb 9.6; hct 29.7 mcv 102.4 plt 105; glucose 99; bun 20; creat 1.12; k+ 3.2; na++ 142; ca 8.8 liver normal albumin 3.2  10-16-20: wbc 3.9; hgb 10.2; hct 32.3; mcv 104.9 plt 95; glucose 98; bun 19; creat 1.25; k+ 3.9; na++ 139; ca 8.8 liver normal albumin 3.2   TODAY  12-04-20: wbc 3.1; hgb 10.9; hct 34.6; mcv 103.0 plt 130; glucose 132;  bun 27; creat 1.35; k+ 3.6; na++ 143; ca 9.3 liver normal albumin 3.5    Review of Systems  Constitutional: Negative for malaise/fatigue.  Respiratory: Negative for cough and shortness of breath.   Cardiovascular: Negative for chest pain, palpitations and leg swelling.  Gastrointestinal: Negative for abdominal pain, constipation and heartburn.  Musculoskeletal: Negative for back pain, joint pain and myalgias.  Skin: Negative.  Neurological: Negative for dizziness.  Psychiatric/Behavioral: The patient is not nervous/anxious.     Physical Exam Constitutional:      General: She is not in acute distress.    Appearance: She is well-developed and well-nourished. She is not diaphoretic.  Neck:     Thyroid: No thyromegaly.  Cardiovascular:     Rate and Rhythm: Bradycardia present. Rhythm irregular.     Pulses: Normal pulses and intact distal pulses.     Heart sounds: Murmur heard.    Pulmonary:     Effort: Pulmonary effort is normal. No respiratory distress.     Breath sounds: Normal breath sounds.  Abdominal:     General: Bowel sounds are normal. There is no distension.     Palpations: Abdomen is soft.     Tenderness: There is no abdominal tenderness.  Musculoskeletal:        General: No edema. Normal range of motion.     Cervical back: Neck supple.     Right lower leg: No edema.     Left lower leg: No edema.  Lymphadenopathy:     Cervical: No cervical adenopathy.  Skin:    General: Skin is warm and dry.  Neurological:     Mental Status: She is alert. Mental status is at baseline.  Psychiatric:        Mood and Affect: Mood and affect and mood normal.      ASSESSMENT/ PLAN:  TODAY  1. Bradycardia 2. First degree AV block  Will get tsh; vit B12; folate Will setup cardiology consult will monitor   MD is aware of resident's narcotic use and is in agreement with current plan of care. We will attempt to wean resident as appropriate.  Ok Edwards NP [facility] Adult  Medicine  Contact [phone] Monday through Friday 8am- 5pm  After hours call [phone]

## 2020-12-05 NOTE — Progress Notes (Signed)
Primary Care Physician:  Gerlene Fee, NP  Primary Gastroenterologist:  Elon Alas. Abbey Chatters, DO   Chief Complaint  Patient presents with  . Diarrhea    HPI:  Amanda Wu is a 74 y.o. female here at the request of Ok Edwards NP for further evaluation of diarrhea. Patient is resident of [facility]. Has been at the facility since 07/2020. Discuss concerns with patient's nurse via phone as she came to our office today unaccompanied and unable to provide a reliable history. Patient has history of Multiple Myeloma and receives treatment through Dr. Delton Coombes (Revlimid 23m 2 weeks on/2 weeks off along with Velcade 3 weeks on/1 week off. Dexamethasone 154mon days of Velcade). 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   Patient has had diarrhea since she has been at the facility. Uncontrollable symptoms at times, multiples bouts a day and associated with fecal incontinence. Sometimes 4-5 stools per day on first shift alone. No reported blood in the stool. No melena. Eating as improved and weight increased. No n/v. Some mild improvement in number of stool daily once discontinued dairy products. Per nurse, patient has not had stool studies performed.   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 [facility] [name]. She denies having surgeries that are reported in her chart. She is very pleasant.         Current Facility-Administered Medications on File Prior to Visit  Medication Dose Route Frequency Provider Last Rate Last Admin  . prochlorperazine (COMPAZINE) tablet 10 mg  10 mg Oral Once KaDerek JackMD       Current Outpatient Medications on File Prior to Visit  Medication Sig Dispense Refill  . acetaminophen (TYLENOL) 500 MG tablet Take 1,000 mg by mouth every 6 (six) hours as needed for mild pain.     . Marland Kitchencyclovir (ZOVIRAX) 400 MG tablet  TAKE 1 TABLET BY MOUTH TWICE DAILY 60 tablet 2  . aspirin EC 81 MG tablet Take 81 mg by mouth daily.    . calcium-vitamin D (OSCAL WITH D) 500-200 MG-UNIT tablet Take 1 tablet by mouth daily with breakfast.    . Lactobacillus Probiotic TABS Take 2 tablets by mouth daily.    . Marland Kitchenenalidomide (REVLIMID) 10 MG capsule TAKE 1 CAPSULE BY MOUTH  DAILY FOR 14 DAYS, THEN 14  DAYS OFF 14 capsule 0  . levothyroxine (SYNTHROID, LEVOTHROID) 25 MCG tablet Take 25 mcg by mouth daily before breakfast.    . loperamide (IMODIUM A-D) 2 MG tablet Take 4 mg by mouth 2 (two) times daily as needed for diarrhea or loose stools.    . Marland Kitchenosartan (COZAAR) 50 MG tablet Take 1 tablet (50 mg total) by mouth daily. 30 tablet 4  . Multiple Vitamins-Minerals (MULTIVITAMIN WOMEN 50+ PO) Take by mouth daily.     . NON FORMULARY Regular Diet with Meals    . NON FORMULARY Wanderguard #1335 to ankle for safety awareness. Check placement and function qshift. Every Shift Day, Evening, Night    . Nutritional Supplements (ENSURE CLEAR PO) Take by mouth 2 (two) times daily. Due to variable meal intake, cognitive impairment and low BMI    . potassium chloride SA (KLOR-CON) 20 MEQ tablet Take 20 mEq by mouth daily.        No current facility-administered medications for this visit.   Current Outpatient Medications  Medication  Sig Dispense Refill     Allergies as of 12/05/2020 - Review Complete 12/05/2020  Allergen Reaction Noted  . Lotensin [benazepril]  12/05/2020    Past Medical History:  Diagnosis Date  . Benign hypertension   . Cataract   . 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 Diabetes mellitus without complication ([place])   . H/O partial nephrectomy   . Hypokalemia   . Hypothyroidism   . Impaired cognition   . Multiple myeloma (HCC)    Dr Maylon Peppers, [facility]  . Thyroid disease     Past Surgical History:  Procedure Laterality Date  . ABDOMINAL HYSTERECTOMY    . CRANIOTOMY     for lymphoma  . YAG LASER APPLICATION  Right 6/96/2952   Procedure: YAG LASER APPLICATION;  Surgeon: [name], MD;  Location: [facility];  Service: Ophthalmology;  Laterality: Right;    Family History  Problem Relation Age of Onset  . Depression Mother   . Diabetes Mother   . Heart disease Father   . Cancer - Prostate Brother   . Cancer Brother   . Cancer Sister     Social History   Socioeconomic History  . Marital status: Single    Spouse name: Not on file  . Number of children: Not on file  . Years of education: Not on file  . Highest education level: Not on file  Occupational History  . Occupation: retired   Tobacco Use  . Smoking status: Never Smoker  . Smokeless tobacco: Never Used  Substance and Sexual Activity  . Alcohol use: No  . Drug use: No  . Sexual activity: Never  Other Topics Concern  . Not on file  Social History Narrative   Long term resident of [facility]    Social Determinants of Health   Financial Resource Strain: Low Risk   . Difficulty of Paying Living Expenses: Not very hard  Food Insecurity: No Food Insecurity  . Worried About Charity fundraiser in the Last Year: Never true  . Ran Out of Food in the Last Year: Never true  Transportation Needs: No Transportation Needs  . Lack of Transportation (Medical): No  . Lack of Transportation (Non-Medical): No  Physical Activity: Inactive  . Days of Exercise per Week: 0 days  . Minutes of Exercise per Session: 0 min  Stress: No Stress Concern Present  . Feeling of Stress : Not at all  Social Connections: Moderately Isolated  . Frequency of Communication with Friends and Family: More than three times a week  . Frequency of Social Gatherings with Friends and Family: Once a week  . Attends Religious Services: More than 4 times per year  . Active Member of Clubs or Organizations: No  . Attends Archivist Meetings: Never  . Marital Status: Never married  Intimate Partner Violence: Not on file      ROS: patient denies any  symptoms, history unreliable  General: Negative for anorexia, weight loss, fever, chills, fatigue, weakness. Eyes: Negative for vision changes.  ENT: Negative for hoarseness, difficulty swallowing , nasal congestion. CV: Negative for chest pain, angina, palpitations, dyspnea on exertion, peripheral edema.  Respiratory: Negative for dyspnea at rest, dyspnea on exertion, cough, sputum, wheezing.  GI: See history of present illness. GU:  Negative for dysuria, hematuria, urinary incontinence, urinary frequency, nocturnal urination.  MS: Negative for joint pain, low back pain.  Derm: Negative for rash or itching.  Neuro: Negative for weakness, abnormal sensation, seizure, frequent headaches, memory  loss, confusion.  Psych: Negative for anxiety, depression, suicidal ideation, hallucinations.  Endo: Negative for unusual weight change.  Heme: Negative for bruising or bleeding. Allergy: Negative for rash or hives.    Physical Examination:  BP 135/71   Pulse (!) 44   Temp (!) 97.1 F (36.2 C) (Temporal)   Ht '5\' 9"'  (1.753 m)   Wt 112 lb 3.2 oz (50.9 kg)   BMI 16.57 kg/m    General: Thin pleasant cooperative female in NAD.  Head: Normocephalic, atraumatic.   Eyes: Conjunctiva pink, no icterus. Mouth:masked. Neck: Supple without thyromegaly, masses, or lymphadenopathy.  Lungs: Clear to auscultation bilaterally.  Heart: bradycardia and irregular rhythm, no murmurs rubs or gallops.  Abdomen: Bowel sounds are normal, nontender, nondistended, no hepatosplenomegaly or masses, no abdominal bruits or    hernia , no rebound or guarding.   Rectal: not performed Extremities: No lower extremity edema. No clubbing or deformities.  Neuro: Alert and oriented x 4 , grossly normal neurologically.  Skin: Warm and dry, no rash or jaundice.   Psych: Alert and cooperative, normal mood and affect.  Labs: Lab Results  Component Value Date   CREATININE 1.35 (H) 12/04/2020   BUN 27 (H) 12/04/2020   NA 143  12/04/2020   K 3.6 12/04/2020   CL 108 12/04/2020   CO2 24 12/04/2020   Lab Results  Component Value Date   ALT 26 12/04/2020   AST 31 12/04/2020   ALKPHOS 56 12/04/2020   BILITOT 0.6 12/04/2020   Lab Results  Component Value Date   WBC 3.1 (L) 12/04/2020   HGB 10.9 (L) 12/04/2020   HCT 34.6 (L) 12/04/2020   MCV 103.0 (H) 12/04/2020   PLT 130 (L) 12/04/2020   No results found for: IRON, TIBC, FERRITIN Lab Results  Component Value Date   VITAMINB12 620 07/20/2020   Lab Results  Component Value Date   FOLATE 23.1 07/20/2020   Lab Results  Component Value Date   TSH 2.264 07/23/2020     Imaging Studies: No results found.  Impression:  74 y/o female with PMH significant for Multiple Myeloma (currently on Revlimid 38m 2 weeks on/2 weeks off along with Velcade 3 weeks on/1 week off. Dexamethasone 119mon days of Velcade), DM, dementia presenting for further evaluation of diarrhea with incontinence. Nursing staff reports daily diarrhea, numerous stools daily. No other associated symptoms. Oncology suspects diarrhea related to Velcade and/or Revlimid. It is unclear to me how much or how often she gets Imodium. Her symptoms could be medication related but will also assess for infectious etiology  On exam today she was also noted to have bradycardia and irregular rhythm. She was asymptomatic. I called to speak with her SNF provider who was not on premises at the time. Spoke with WeAbigail ButtsDON who will arrange for EKG when patient arrives back at the facility and addressed with attending.   Plan: 1. Stool for Cdiff GDH and pathogen panel. [facility] by [facility].  3. Further recommendations to follow.

## 2020-12-05 NOTE — Patient Instructions (Addendum)
Please collect stool for Cdiff GDH and GI pathogen panel.  I have spoke to Director of Nursing about bradycardia and irregular rhythm. Patient needs EKG performed when patient returns to the SNF and addressed with attending.

## 2020-12-06 ENCOUNTER — Other Ambulatory Visit (HOSPITAL_COMMUNITY)
Admission: RE | Admit: 2020-12-06 | Discharge: 2020-12-06 | Disposition: A | Discharge status: Home / Self Care | Payer: Medicare PPO | Source: Skilled Nursing Facility | Attending: Adult Health | Admitting: Adult Health

## 2020-12-06 DIAGNOSIS — E039 Hypothyroidism, unspecified: Secondary | ICD-10-CM | POA: Diagnosis not present

## 2020-12-06 LAB — TSH: TSH: 3.443 u[IU]/mL (ref 0.350–4.500)

## 2020-12-06 LAB — VITAMIN B12: Vitamin B-12: 549 pg/mL (ref 180–914)

## 2020-12-06 LAB — FOLATE: Folate: 22.7 ng/mL (ref >5.9)

## 2020-12-06 NOTE — Progress Notes (Signed)
Cc'ed to pcp °

## 2020-12-09 ENCOUNTER — Encounter (HOSPITAL_COMMUNITY)
Admission: RE | Admit: 2020-12-09 | Discharge: 2020-12-09 | Disposition: A | Discharge status: Home / Self Care | Payer: Medicare PPO | Source: Skilled Nursing Facility | Attending: Gastroenterology | Admitting: Gastroenterology

## 2020-12-09 DIAGNOSIS — R197 Diarrhea, unspecified: Secondary | ICD-10-CM | POA: Diagnosis not present

## 2020-12-10 LAB — GASTROINTESTINAL PANEL BY PCR, STOOL (REPLACES STOOL CULTURE)

## 2020-12-11 ENCOUNTER — Inpatient Hospital Stay (HOSPITAL_COMMUNITY): Payer: Medicare PPO

## 2020-12-11 ENCOUNTER — Other Ambulatory Visit: Payer: Self-pay

## 2020-12-11 ENCOUNTER — Encounter: Payer: Self-pay | Admitting: Internal Medicine

## 2020-12-11 ENCOUNTER — Ambulatory Visit (INDEPENDENT_AMBULATORY_CARE_PROVIDER_SITE_OTHER): Payer: Medicare PPO | Admitting: Internal Medicine

## 2020-12-11 ENCOUNTER — Encounter (HOSPITAL_COMMUNITY): Payer: Self-pay

## 2020-12-11 VITALS — BP 124/70 | HR 56 | Temp 97.0°F | Resp 16 | Wt 110.7 lb

## 2020-12-11 VITALS — BP 104/58 | HR 60 | Ht 69.0 in | Wt 111.6 lb

## 2020-12-11 DIAGNOSIS — Z5112 Encounter for antineoplastic immunotherapy: Secondary | ICD-10-CM | POA: Diagnosis not present

## 2020-12-11 DIAGNOSIS — R9431 Abnormal electrocardiogram [ECG] [EKG]: Secondary | ICD-10-CM

## 2020-12-11 DIAGNOSIS — R001 Bradycardia, unspecified: Secondary | ICD-10-CM | POA: Insufficient documentation

## 2020-12-11 DIAGNOSIS — R079 Chest pain, unspecified: Secondary | ICD-10-CM | POA: Diagnosis not present

## 2020-12-11 DIAGNOSIS — I440 Atrioventricular block, first degree: Secondary | ICD-10-CM | POA: Insufficient documentation

## 2020-12-11 DIAGNOSIS — C9000 Multiple myeloma not having achieved remission: Secondary | ICD-10-CM

## 2020-12-11 LAB — CBC WITH DIFFERENTIAL/PLATELET
Abs Immature Granulocytes: 0.01 10*3/uL (ref 0.00–0.07)
Basophils Absolute: 0 10*3/uL (ref 0.0–0.1)
Basophils Relative: 1 %
Eosinophils Absolute: 0.2 10*3/uL (ref 0.0–0.5)
Eosinophils Relative: 6 %
HCT: 34.1 % — ABNORMAL LOW (ref 36.0–46.0)
Hemoglobin: 11 g/dL — ABNORMAL LOW (ref 12.0–15.0)
Immature Granulocytes: 0 %
Lymphocytes Relative: 16 %
Lymphs Abs: 0.6 10*3/uL — ABNORMAL LOW (ref 0.7–4.0)
MCH: 33 pg (ref 26.0–34.0)
MCHC: 32.3 g/dL (ref 30.0–36.0)
MCV: 102.4 fL — ABNORMAL HIGH (ref 80.0–100.0)
Monocytes Absolute: 0.2 10*3/uL (ref 0.1–1.0)
Monocytes Relative: 6 %
Neutro Abs: 2.5 10*3/uL (ref 1.7–7.7)
Neutrophils Relative %: 71 %
Platelets: 91 10*3/uL — ABNORMAL LOW (ref 150–400)
RBC: 3.33 MIL/uL — ABNORMAL LOW (ref 3.87–5.11)
RDW: 15.1 % (ref 11.5–15.5)
WBC: 3.5 10*3/uL — ABNORMAL LOW (ref 4.0–10.5)
nRBC: 0 % (ref 0.0–0.2)

## 2020-12-11 LAB — COMPREHENSIVE METABOLIC PANEL
ALT: 22 U/L (ref 0–44)
AST: 30 U/L (ref 15–41)
Albumin: 3.3 g/dL — ABNORMAL LOW (ref 3.5–5.0)
Alkaline Phosphatase: 57 U/L (ref 38–126)
Anion gap: 10 (ref 5–15)
BUN: 15 mg/dL (ref 8–23)
CO2: 26 mmol/L (ref 22–32)
Calcium: 8.8 mg/dL — ABNORMAL LOW (ref 8.9–10.3)
Chloride: 105 mmol/L (ref 98–111)
Creatinine, Ser: 1.11 mg/dL — ABNORMAL HIGH (ref 0.44–1.00)
GFR, Estimated: 52 mL/min — ABNORMAL LOW (ref >60)
Glucose, Bld: 150 mg/dL — ABNORMAL HIGH (ref 70–99)
Potassium: 3.4 mmol/L — ABNORMAL LOW (ref 3.5–5.1)
Sodium: 141 mmol/L (ref 135–145)
Total Bilirubin: 0.6 mg/dL (ref 0.3–1.2)
Total Protein: 6.2 g/dL — ABNORMAL LOW (ref 6.5–8.1)

## 2020-12-11 MED ORDER — BORTEZOMIB CHEMO SQ INJECTION 3.5 MG (2.5MG/ML)
1.3000 mg/m2 | Freq: Once | INTRAMUSCULAR | Status: AC
  Administered 2020-12-11: 2 mg via SUBCUTANEOUS
  Filled 2020-12-11: qty 0.8

## 2020-12-11 MED ORDER — DEXAMETHASONE 4 MG PO TABS
10.0000 mg | ORAL_TABLET | Freq: Once | ORAL | Status: AC
  Administered 2020-12-11: 10 mg via ORAL
  Filled 2020-12-11: qty 3

## 2020-12-11 NOTE — Progress Notes (Signed)
Amanda Wu tolerated Velcade injection well without complaints or incident. Labs reviewed prior to administering this medication. VSS Pt discharged via wheelchair in satisfactory condition 

## 2020-12-11 NOTE — Progress Notes (Signed)
Cardiology Office Note   Date:  12/11/2020   ID:  Amanda Wu 1946/04/25, MRN [id number]  PCP:  Gerlene Fee, NP  Cardiologist:   Dorris Carnes, MD   [facility]    History of Present Illness: Amanda Wu is a 74 y.o. female with a history of multiple myeloma (receiving chemotherapy), dementia due to whole brain RT for lymphoma, diarrhea The pt presents today for eval of ? CP and bradycardia     She comes in with someone from [facility] at [facility] was supposed to come but did not     On discussion with [name], nurse from the [facility], the patient had an episode of bradycardia with a heart rate of 44 noted while getting an infusion for her multiple myeloma (chemotherapy). She notes the patient has never complained of chest pain. She is unaware of dizziness recently.  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 acetaminophen (TYLENOL) 500 MG tablet Take 1,000 mg by mouth every 6 (six) hours as needed for mild pain.   Marland Kitchen acyclovir (ZOVIRAX) 400 MG tablet TAKE 1 TABLET BY MOUTH TWICE DAILY  . aspirin EC 81 MG tablet Take 81 mg by mouth daily.  . calcium-vitamin D (OSCAL WITH D) 500-200 MG-UNIT tablet Take 1 tablet by mouth daily with breakfast.  . Lactobacillus Probiotic TABS Take 2 tablets by mouth daily.  Marland Kitchen lenalidomide (REVLIMID) 10 MG capsule TAKE 1 CAPSULE BY MOUTH  DAILY FOR 14 DAYS, THEN 14  DAYS OFF  . levothyroxine (SYNTHROID, LEVOTHROID) 25 MCG tablet Take 25 mcg by mouth daily before breakfast.  . loperamide (IMODIUM A-D) 2 MG tablet Take 4 mg by mouth 2 (two) times daily as needed for diarrhea or loose stools.  Marland Kitchen losartan (COZAAR) 50 MG tablet Take 1 tablet (50 mg total) by mouth daily.  . Multiple Vitamins-Minerals (MULTIVITAMIN WOMEN 50+ PO) Take by mouth daily.   . NON FORMULARY Regular Diet with Meals  .  NON FORMULARY Wanderguard #1335 to ankle for safety awareness. Check placement and function qshift. Every Shift Day, Evening, Night  . potassium chloride SA (KLOR-CON) 20 MEQ tablet Take 20 mEq by mouth daily.      Allergies:   Lotensin [benazepril]   Past Medical History:  Diagnosis Date  . Benign hypertension   . Cataract   . 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 Diabetes mellitus without complication ([place])   . H/O partial nephrectomy   . Hypokalemia   . Hypothyroidism   . Impaired cognition   . Multiple myeloma (HCC)    Dr Maylon Peppers, [facility]  . Thyroid disease     Past Surgical History:  Procedure Laterality Date  . ABDOMINAL HYSTERECTOMY    . CRANIOTOMY     for lymphoma  . YAG LASER APPLICATION Right 6/76/7209   Procedure: YAG LASER APPLICATION;  Surgeon: [name], MD;  Location: [facility];  Service: Ophthalmology;  Laterality: Right;     Social History:  The patient  reports that she has never smoked. She has never used smokeless tobacco. She reports that she does not drink alcohol and does not use drugs.   Family History:  The patient's family history includes Cancer in her brother and  sister; Cancer - Prostate in her brother; Depression in her mother; Diabetes in her mother; Heart disease in her father.    ROS:  Please see the history of present illness. All other systems are reviewed and  Negative to the above problem except as noted.    PHYSICAL EXAM: VS:  BP (!) 104/58   Pulse 60   Ht _0  (1.753 m)   Wt 111 lb 9.6 oz (50.6 kg)   SpO2 100%   BMI 16.48 kg/m   TMH:DQQI thin 74 yo , in no acute distress  HEENT: normal  Neck: no JVD, carotid bruits,  Cardiac: RRR; no murmurs, rubs, or gallops,no LE  edema  Respiratory:  clear to auscultation bilaterally, normal work of breathing GI: soft, nontender, nondistended, + BS  No hepatomegaly  MS: no deformity Moving all extremities   Skin: warm and dry, no rash Neuro:  Alert   Awake  CN II to XII intact   Moving all extremities  Slow  Otherwise deferred Psych   FLat affect    EKG:  EKG is ordered today.  SR72 bpm with occasional PVC  First degree AV block   PR interval 238 msec   T wave inversion II, III, AVF     Lipid Panel    Component Value Date/Time   CHOL 116 07/30/2020 0654   TRIG 71 07/30/2020 0654   HDL 53 07/30/2020 0654   CHOLHDL 2.2 07/30/2020 0654   VLDL 14 07/30/2020 0654   LDLCALC 49 07/30/2020 0654      Wt Readings from Last 3 Encounters:  12/11/20 111 lb 9.6 oz (50.6 kg)  12/05/20 109 lb 12.8 oz (49.8 kg)  12/05/20 112 lb 3.2 oz (50.9 kg)      ASSESSMENT AND PLAN:  1   Bradycardia   1 episode noted of bradycardia while getting chemotherapy infusion. No associated hypotension. No other episodes of bradycardia/hypotension. I would follow. Will get echo given new T wave changes and PVC. Note a PVC with pause could have led to a lower heart rate being recorded. Otherwise I would not plan any further work-up for now. Only if the patient becomes symptomatic with more low heart rates low associated with low blood pressure.  F/U based on test results and communications      Current medicines are reviewed at length with the patient today.  The patient does not have concerns regarding medicines.  Signed, Dorris Carnes, MD  12/11/2020 9:59 AM    [address], [place], [place]  [address] Phone: [phone]; Fax: [phone]

## 2020-12-11 NOTE — Progress Notes (Signed)
Ok to proceed with today's labs for Velcade.  Dr Carilyn Goodpasture, PharmD

## 2020-12-11 NOTE — Patient Instructions (Signed)
Medication Instructions:  *If you need a refill on your cardiac medications before your next appointment, please call your pharmacy*  Testing/Procedures: Your physician has requested that you have an echocardiogram. Echocardiography is a painless test that uses sound waves to create images of your heart. It provides your doctor with information about the size and shape of your heart and how well your heart's chambers and valves are working. This procedure takes approximately one hour. There are no restrictions for this procedure.  Follow-Up: At [facility], you and your health needs are our priority.  As part of our continuing mission to provide you with exceptional heart care, we have created designated Provider Care Teams.  These Care Teams include your primary Cardiologist (physician) and Advanced Practice Providers (APPs -  Physician Assistants and Nurse Practitioners) who all work together to provide you with the care you need, when you need it.  We recommend signing up for the patient portal called "MyChart".  Sign up information is provided on this After Visit Summary.  MyChart is used to connect with patients for Virtual Visits (Telemedicine).  Patients are able to view lab/test results, encounter notes, upcoming appointments, etc.  Non-urgent messages can be sent to your provider as well.   To learn more about what you can do with MyChart, go to [email or web address].    Your next appointment:   Your follow up will be determined after your testing  The format for your next appointment:   In Person with Dietrich Pates, MD

## 2020-12-11 NOTE — Patient Instructions (Signed)
[facility] Discharge Instructions for Patients Receiving Chemotherapy   Beginning January 23rd 2017 lab work for the Cancer Center will be done in the  Main lab at  on 1st floor. If you have a lab appointment with the Cancer Center please come in thru the  Main Entrance and check in at the main information desk   Today you received the following chemotherapy agents Velcade. Follow-up as scheduled  To help prevent nausea and vomiting after your treatment, we encourage you to take your nausea medication   If you develop nausea and vomiting, or diarrhea that is not controlled by your medication, call the clinic.  The clinic phone number is [phone]. Office hours are Monday-Friday 8:30am-5:00pm.  BELOW ARE SYMPTOMS THAT SHOULD BE REPORTED IMMEDIATELY:  *FEVER GREATER THAN 101.0 F  *CHILLS WITH OR WITHOUT FEVER  NAUSEA AND VOMITING THAT IS NOT CONTROLLED WITH YOUR NAUSEA MEDICATION  *UNUSUAL SHORTNESS OF BREATH  *UNUSUAL BRUISING OR BLEEDING  TENDERNESS IN MOUTH AND THROAT WITH OR WITHOUT PRESENCE OF ULCERS  *URINARY PROBLEMS  *BOWEL PROBLEMS  UNUSUAL RASH Items with * indicate a potential emergency and should be followed up as soon as possible. If you have an emergency after office hours please contact your primary care physician or go to the nearest emergency department.  Please call the clinic during office hours if you have any questions or concerns.   You may also contact the Patient Navigator at [phone] should you have any questions or need assistance in obtaining follow up care.      Resources For Cancer Patients and their Caregivers ? American Cancer Society: Can assist with transportation, wigs, general needs, runs Look Good Feel Better.        1-888-227-6333 ? Cancer Care: Provides financial assistance, online support groups, medication/co-pay assistance.  1-800-813-HOPE (4673) ? [facility] Assists [place] cancer patients and their families through emotional , educational and financial support.  336-427-4357 ? [place] DSS Where to apply for food stamps, Medicaid and utility assistance. 336-342-1394 ? [facility]: Transportation to medical appointments. 336-347-2287 ? Social Security Administration: May apply for disability if have a Stage IV cancer. 336-342-7796 1-800-772-1213 ? [place] Aging, Disability and Transit Services: Assists with nutrition, care and transit needs. 336-349-2343         

## 2020-12-12 ENCOUNTER — Other Ambulatory Visit: Payer: Self-pay

## 2020-12-12 ENCOUNTER — Ambulatory Visit (HOSPITAL_COMMUNITY)
Admission: RE | Admit: 2020-12-12 | Discharge: 2020-12-12 | Disposition: A | Discharge status: Home / Self Care | Payer: Medicare PPO | Source: Ambulatory Visit | Attending: Internal Medicine | Admitting: Internal Medicine

## 2020-12-12 DIAGNOSIS — R9431 Abnormal electrocardiogram [ECG] [EKG]: Secondary | ICD-10-CM | POA: Insufficient documentation

## 2020-12-12 DIAGNOSIS — C8590 Non-Hodgkin lymphoma, unspecified, unspecified site: Secondary | ICD-10-CM | POA: Diagnosis not present

## 2020-12-12 DIAGNOSIS — Z1159 Encounter for screening for other viral diseases: Secondary | ICD-10-CM | POA: Diagnosis not present

## 2020-12-12 LAB — ECHOCARDIOGRAM COMPLETE
Area-P 1/2: 3.1 cm2
Height: 69 in
MV M vel: 5.01 m/s
MV Peak grad: 100.4 mmHg
S' Lateral: 2.9 cm
Weight: 1770.73 oz

## 2020-12-12 NOTE — Progress Notes (Signed)
*  PRELIMINARY RESULTS* Echocardiogram 2D Echocardiogram has been performed.  Stacey Drain 12/12/2020, 12:53 PM

## 2020-12-13 ENCOUNTER — Other Ambulatory Visit (HOSPITAL_COMMUNITY): Payer: Self-pay

## 2020-12-13 ENCOUNTER — Telehealth: Payer: Self-pay

## 2020-12-13 DIAGNOSIS — C9000 Multiple myeloma not having achieved remission: Secondary | ICD-10-CM

## 2020-12-13 MED ORDER — LENALIDOMIDE 10 MG PO CAPS: ORAL_CAPSULE | ORAL | 0 refills | Status: DC

## 2020-12-13 NOTE — Telephone Encounter (Signed)
Contacted [name] and related to Amanda Wu nurse the results of her Echocardiogram. I also routed a copy of the results to both them and Amanda Wu PCP,

## 2020-12-13 NOTE — Telephone Encounter (Signed)
-----   Message from Nori Riis, RN sent at 12/13/2020  7:22 AM EST -----  ----- Message ----- From: Pricilla Riffle, MD Sent: 12/12/2020   8:22 PM EST To: Nori Riis, RN  Echo is normal   Normal LV and RV systolic function Overall normal valve function

## 2020-12-13 NOTE — Telephone Encounter (Signed)
Chart reviewed. Revlimid refilled per Dr. Delton Coombes.

## 2020-12-17 DIAGNOSIS — Z1159 Encounter for screening for other viral diseases: Secondary | ICD-10-CM | POA: Diagnosis not present

## 2020-12-17 DIAGNOSIS — C8590 Non-Hodgkin lymphoma, unspecified, unspecified site: Secondary | ICD-10-CM | POA: Diagnosis not present

## 2020-12-21 ENCOUNTER — Non-Acute Institutional Stay (SKILLED_NURSING_FACILITY): Payer: Medicare PPO | Admitting: Adult Health

## 2020-12-21 ENCOUNTER — Encounter: Payer: Self-pay | Admitting: Adult Health

## 2020-12-21 DIAGNOSIS — K567 Ileus, unspecified: Secondary | ICD-10-CM | POA: Diagnosis not present

## 2020-12-21 DIAGNOSIS — R197 Diarrhea, unspecified: Secondary | ICD-10-CM | POA: Diagnosis not present

## 2020-12-21 NOTE — Progress Notes (Signed)
Location:  [place] Room Number: 148/W Place of Service:  SNF (31)   CODE STATUS: Full Code  Allergies  Allergen Reactions  . Lotensin [Benazepril]     Chief Complaint  Patient presents with  . Acute Visit    Diarrhea     HPI:  She continues to have liquid diarrhea. She denies any abdominal pain. She denies any nausea or vomiting. She does not associated any food with her diarrhea. There are no reports of fevers present.   Past Medical History:  Diagnosis Date  . Benign hypertension   . Cataract   . 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 Diabetes mellitus without complication ([place])   . H/O partial nephrectomy   . Hypokalemia   . Hypothyroidism   . Impaired cognition   . Multiple myeloma (H[facility])    Dr Maylon Peppers, [facility]  . Thyroid disease     Past Surgical History:  Procedure Laterality Date  . ABDOMINAL HYSTERECTOMY    . CRANIOTOMY     for lymphoma  . YAG LASER APPLICATION Right 1/61/0960   Procedure: YAG LASER APPLICATION;  Surgeon: [name], MD;  Location: [facility];  Service: Ophthalmology;  Laterality: Right;    Social History   Socioeconomic History  . Marital status: Single    Spouse name: Not on file  . Number of children: Not on file  . Years of education: Not on file  . Highest education level: Not on file  Occupational History  . Occupation: retired   Tobacco Use  . Smoking status: Never Smoker  . Smokeless tobacco: Never Used  Vaping Use  . Vaping Use: Never used  Substance and Sexual Activity  . Alcohol use: No  . Drug use: No  . Sexual activity: Never  Other Topics Concern  . Not on file  Social History Narrative   Long term resident of [facility]    Social Determinants of Health   Financial Resource Strain: Low Risk   . Difficulty of Paying Living Expenses: Not very hard  Food Insecurity: No Food Insecurity  . Worried About Charity fundraiser in the Last Year: Never true  . Ran Out of Food in the Last Year:  Never true  Transportation Needs: No Transportation Needs  . Lack of Transportation (Medical): No  . Lack of Transportation (Non-Medical): No  Physical Activity: Inactive  . Days of Exercise per Week: 0 days  . Minutes of Exercise per Session: 0 min  Stress: No Stress Concern Present  . Feeling of Stress : Not at all  Social Connections: Moderately Isolated  . Frequency of Communication with Friends and Family: More than three times a week  . Frequency of Social Gatherings with Friends and Family: Once a week  . Attends Religious Services: More than 4 times per year  . Active Member of Clubs or Organizations: No  . Attends Archivist Meetings: Never  . Marital Status: Never married  Intimate Partner Violence: Not on file   Family History  Problem Relation Age of Onset  . Depression Mother   . Diabetes Mother   . Heart disease Father   . Cancer - Prostate Brother   . Cancer Brother   . Cancer Sister       VITAL SIGNS BP 109/72   Pulse 65   Temp 98 F (36.7 C)   Ht '5\' 9"'  (1.753 m)   Wt 110 lb 3.2 oz (50 kg)   BMI 16.27  kg/m   Facility-Administered Encounter Medications as of 12/21/2020  Medication  . prochlorperazine (COMPAZINE) tablet 10 mg   Outpatient Encounter Medications as of 12/21/2020  Medication Sig  . acetaminophen (TYLENOL) 500 MG tablet Take 1,000 mg by mouth every 6 (six) hours as needed for mild pain.   Marland Kitchen acyclovir (ZOVIRAX) 400 MG tablet TAKE 1 TABLET BY MOUTH TWICE DAILY  . aspirin EC 81 MG tablet Take 81 mg by mouth daily.  . calcium-vitamin D (OSCAL WITH D) 500-200 MG-UNIT tablet Take 1 tablet by mouth daily with breakfast.  . cholestyramine (QUESTRAN) 4 g packet Take 4 g by mouth 2 (two) times daily.  . Lactobacillus Probiotic TABS Take 2 tablets by mouth daily.  Marland Kitchen lenalidomide (REVLIMID) 10 MG capsule TAKE 1 CAPSULE BY MOUTH  DAILY FOR 14 DAYS, THEN 14  DAYS OFF  . levothyroxine (SYNTHROID, LEVOTHROID) 25 MCG tablet Take 25 mcg by mouth  daily before breakfast.  . loperamide (IMODIUM A-D) 2 MG tablet Take 4 mg by mouth 2 (two) times daily as needed for diarrhea or loose stools.  Marland Kitchen losartan (COZAAR) 50 MG tablet Take 1 tablet (50 mg total) by mouth daily.  . Multiple Vitamins-Minerals (MULTIVITAMIN WOMEN 50+ PO) Take by mouth daily.   . NON FORMULARY Regular Diet with Meals  . NON FORMULARY Wanderguard #1335 to ankle for safety awareness. Check placement and function qshift. Every Shift Day, Evening, Night  . potassium chloride SA (KLOR-CON) 20 MEQ tablet Take 20 mEq by mouth daily.      SIGNIFICANT DIAGNOSTIC EXAMS  PREVIOUS   08-10-20: t score: -3.758  12-05-20: EKG: sinus bradycardia with first degree AV block pvc pac   NO NEW EXAMS.   LABS REVIEWED PREVIOUS    07-20-20: vit B 12: 620  07-23-20: tsh 2.264 07-24-20: wbc 3.9; hgb 9.3; hct 29.3 mcv 105.4 plt 105; glucose 106; bun 25; creat 1.28; k+ 3.6; na++ 142; a 9.2 liver normal albumin 3.5  07-30-20: chol 116; ldl 49; trig 71; hdl 53; hgb a1c 5.1; hep C neg  08-07-20: wbc 3.5; hgb 9.0; hct 27.9; mcv 104.5 plt 112; glucose 86; bun 27; creat 1.22; k+ 4.2; an++ 143; ca 9.0 liver normal albumin 3.2 08-14-20: wbc 2.8; hgb 9.4; hct 29.2; mcv 104.3 plt 101; glucose 71; bun 27; creat 1.41; k+ 4.3; na++ 140; ca 9.0 liver normal albumin 3.1  08-16-20: urine micro-albumin 9.3 08-21-20: wbc 3.9; hgb 9.5; hct 29.7 mcv 104.2 plt 86; glucose 86; bun 18; creat 1.09; k+ 3.7; na++ 144; ca 8.1; liver normal albumin 3.1  09-18-20: wbc 4.4; hgb 9.6; hct 29.7 mcv 102.4 plt 105; glucose 99; bun 20; creat 1.12; k+ 3.2; na++ 142; ca 8.8 liver normal albumin 3.2  10-16-20: wbc 3.9; hgb 10.2; hct 32.3; mcv 104.9 plt 95; glucose 98; bun 19; creat 1.25; k+ 3.9; na++ 139; ca 8.8 liver normal albumin 3.2  12-04-20: wbc 3.1; hgb 10.9; hct 34.6; mcv 103.0 plt 130; glucose 132; bun 27; creat 1.35; k+ 3.6; na++ 143; ca 9.3 liver normal albumin 3.5   NO NEW LABS.   Review of Systems  Constitutional: Negative  for malaise/fatigue.  Respiratory: Negative for cough and shortness of breath.   Cardiovascular: Negative for chest pain, palpitations and leg swelling.  Gastrointestinal: Positive for diarrhea. Negative for abdominal pain, heartburn, nausea and vomiting.  Musculoskeletal: Negative for back pain, joint pain and myalgias.  Skin: Negative.   Neurological: Negative for dizziness.  Psychiatric/Behavioral: The patient is not nervous/anxious.  Physical Exam Constitutional:      General: She is not in acute distress.    Appearance: She is well-nourished. She is cachectic. She is not diaphoretic.  Neck:     Thyroid: No thyromegaly.  Cardiovascular:     Rate and Rhythm: Bradycardia present. Rhythm irregular.     Pulses: Normal pulses and intact distal pulses.     Heart sounds: Murmur heard.    Pulmonary:     Effort: Pulmonary effort is normal. No respiratory distress.     Breath sounds: Normal breath sounds.  Abdominal:     General: Bowel sounds are normal. There is no distension.     Palpations: Abdomen is soft.     Tenderness: There is no abdominal tenderness.  Musculoskeletal:        General: No edema. Normal range of motion.     Cervical back: Neck supple.     Right lower leg: No edema.     Left lower leg: No edema.  Lymphadenopathy:     Cervical: No cervical adenopathy.  Skin:    General: Skin is warm and dry.  Neurological:     Mental Status: She is alert. Mental status is at baseline.  Psychiatric:        Mood and Affect: Mood and affect and mood normal.      ASSESSMENT/ PLAN:  TODAY  1. Diarrhea:  Is without relief.  Will continue questran 4 gm twice daily Will put her on a dairy free diet; could be related to lactose intolerance Will get KUB Will monitor her status.   MD is aware of resident's narcotic use and is in agreement with current plan of care. We will attempt to wean resident as appropriate.  Ok Edwards NP [facility]  Contact  [phone] Monday through Friday 8am- 5pm  After hours call [phone]

## 2020-12-24 ENCOUNTER — Encounter: Payer: Self-pay | Admitting: Adult Health

## 2020-12-24 ENCOUNTER — Non-Acute Institutional Stay (SKILLED_NURSING_FACILITY): Payer: Medicare PPO | Admitting: Adult Health

## 2020-12-24 DIAGNOSIS — R197 Diarrhea, unspecified: Secondary | ICD-10-CM

## 2020-12-24 NOTE — Progress Notes (Signed)
Location:  [place] Room Number: 148/W Place of Service:  SNF (31)   CODE STATUS: Full Code  Allergies  Allergen Reactions  . Lotensin [Benazepril]     Chief Complaint  Patient presents with  . Acute Visit    Diarrhea     HPI:  She continues to have frequent episodes of liquids stools. She is unable to control her stools an is incontinent . She denis any abdominal pain; she has had 3 liquid stools today. She is on clear liquid diet to her ileus. She is going to need to be seen by GI. She is presently on clear liquids; she denies any nausea or vomiting.    Past Medical History:  Diagnosis Date  . Benign hypertension   . Cataract   . 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 Diabetes mellitus without complication ([place])   . H/O partial nephrectomy   . Hypokalemia   . Hypothyroidism   . Impaired cognition   . Multiple myeloma (H[facility])    Dr Maylon Peppers, [facility]  . Thyroid disease     Past Surgical History:  Procedure Laterality Date  . ABDOMINAL HYSTERECTOMY    . CRANIOTOMY     for lymphoma  . YAG LASER APPLICATION Right 08/28/7828   Procedure: YAG LASER APPLICATION;  Surgeon: [name], MD;  Location: [facility];  Service: Ophthalmology;  Laterality: Right;    Social History   Socioeconomic History  . Marital status: Single    Spouse name: Not on file  . Number of children: Not on file  . Years of education: Not on file  . Highest education level: Not on file  Occupational History  . Occupation: retired   Tobacco Use  . Smoking status: Never Smoker  . Smokeless tobacco: Never Used  Vaping Use  . Vaping Use: Never used  Substance and Sexual Activity  . Alcohol use: No  . Drug use: No  . Sexual activity: Never  Other Topics Concern  . Not on file  Social History Narrative   Long term resident of [facility]    Social Determinants of Health   Financial Resource Strain: Low Risk   . Difficulty of Paying Living Expenses: Not very hard  Food  Insecurity: No Food Insecurity  . Worried About Charity fundraiser in the Last Year: Never true  . Ran Out of Food in the Last Year: Never true  Transportation Needs: No Transportation Needs  . Lack of Transportation (Medical): No  . Lack of Transportation (Non-Medical): No  Physical Activity: Inactive  . Days of Exercise per Week: 0 days  . Minutes of Exercise per Session: 0 min  Stress: No Stress Concern Present  . Feeling of Stress : Not at all  Social Connections: Moderately Isolated  . Frequency of Communication with Friends and Family: More than three times a week  . Frequency of Social Gatherings with Friends and Family: Once a week  . Attends Religious Services: More than 4 times per year  . Active Member of Clubs or Organizations: No  . Attends Archivist Meetings: Never  . Marital Status: Never married  Intimate Partner Violence: Not on file   Family History  Problem Relation Age of Onset  . Depression Mother   . Diabetes Mother   . Heart disease Father   . Cancer - Prostate Brother   . Cancer Brother   . Cancer Sister       VITAL SIGNS BP 120/70  Pulse 89   Ht '5\' 9"'  (1.753 m)   Wt 110 lb 3.2 oz (50 kg)   BMI 16.27 kg/m   Facility-Administered Encounter Medications as of 12/24/2020  Medication  . prochlorperazine (COMPAZINE) tablet 10 mg   Outpatient Encounter Medications as of 12/24/2020  Medication Sig  . acetaminophen (TYLENOL) 500 MG tablet Take 1,000 mg by mouth every 6 (six) hours as needed for mild pain.   Marland Kitchen acyclovir (ZOVIRAX) 400 MG tablet TAKE 1 TABLET BY MOUTH TWICE DAILY  . aspirin EC 81 MG tablet Take 81 mg by mouth daily.  . Calcium Carbonate-Vitamin D (CALCIUM-VITAMIN D3 PO) Take 400 Units by mouth daily in the afternoon.  . cholestyramine (QUESTRAN) 4 g packet Take 4 g by mouth 2 (two) times daily.  . Lactobacillus Probiotic TABS Take 2 tablets by mouth daily.  Marland Kitchen lenalidomide (REVLIMID) 10 MG capsule TAKE 1 CAPSULE BY MOUTH   DAILY FOR 14 DAYS, THEN 14  DAYS OFF  . levothyroxine (SYNTHROID, LEVOTHROID) 25 MCG tablet Take 25 mcg by mouth daily before breakfast.  . loperamide (IMODIUM A-D) 2 MG tablet Take 4 mg by mouth 2 (two) times daily as needed for diarrhea or loose stools.  Marland Kitchen losartan (COZAAR) 50 MG tablet Take 1 tablet (50 mg total) by mouth daily.  . Multiple Vitamins-Minerals (MULTIVITAMIN WOMEN 50+ PO) Take by mouth daily.   . NON FORMULARY Clear liquid diet Every Shift  . NON FORMULARY Wanderguard #1335 to ankle for safety awareness. Check placement and function qshift. Every Shift Day, Evening, Night  . potassium chloride SA (KLOR-CON) 20 MEQ tablet Take 20 mEq by mouth daily.   . [DISCONTINUED] calcium-vitamin D (OSCAL WITH D) 500-200 MG-UNIT tablet Take 1 tablet by mouth daily with breakfast.     SIGNIFICANT DIAGNOSTIC EXAMS   PREVIOUS   08-10-20: t score: -3.758  12-05-20: EKG: sinus bradycardia with first degree AV block pvc pac   TODAY  12-21-20: KUB    LABS REVIEWED PREVIOUS    07-20-20: vit B 12: 620  07-23-20: tsh 2.264 07-24-20: wbc 3.9; hgb 9.3; hct 29.3 mcv 105.4 plt 105; glucose 106; bun 25; creat 1.28; k+ 3.6; na++ 142; a 9.2 liver normal albumin 3.5  07-30-20: chol 116; ldl 49; trig 71; hdl 53; hgb a1c 5.1; hep C neg  08-07-20: wbc 3.5; hgb 9.0; hct 27.9; mcv 104.5 plt 112; glucose 86; bun 27; creat 1.22; k+ 4.2; an++ 143; ca 9.0 liver normal albumin 3.2 08-14-20: wbc 2.8; hgb 9.4; hct 29.2; mcv 104.3 plt 101; glucose 71; bun 27; creat 1.41; k+ 4.3; na++ 140; ca 9.0 liver normal albumin 3.1  08-16-20: urine micro-albumin 9.3 08-21-20: wbc 3.9; hgb 9.5; hct 29.7 mcv 104.2 plt 86; glucose 86; bun 18; creat 1.09; k+ 3.7; na++ 144; ca 8.1; liver normal albumin 3.1  09-18-20: wbc 4.4; hgb 9.6; hct 29.7 mcv 102.4 plt 105; glucose 99; bun 20; creat 1.12; k+ 3.2; na++ 142; ca 8.8 liver normal albumin 3.2  10-16-20: wbc 3.9; hgb 10.2; hct 32.3; mcv 104.9 plt 95; glucose 98; bun 19; creat 1.25; k+  3.9; na++ 139; ca 8.8 liver normal albumin 3.2  12-04-20: wbc 3.1; hgb 10.9; hct 34.6; mcv 103.0 plt 130; glucose 132; bun 27; creat 1.35; k+ 3.6; na++ 143; ca 9.3 liver normal albumin 3.5   NO NEW LABS.    Review of Systems  Constitutional: Negative for malaise/fatigue.  Respiratory: Negative for cough and shortness of breath.   Cardiovascular: Negative for chest pain,  palpitations and leg swelling.  Gastrointestinal: Positive for diarrhea. Negative for abdominal pain, heartburn, nausea and vomiting.  Musculoskeletal: Negative for back pain, joint pain and myalgias.  Skin: Negative.   Neurological: Negative for dizziness.  Psychiatric/Behavioral: The patient is not nervous/anxious.     Physical Exam Constitutional:      General: She is not in acute distress.    Appearance: She is well-developed and well-nourished. She is not diaphoretic.  Neck:     Thyroid: No thyromegaly.  Cardiovascular:     Rate and Rhythm: Normal rate and regular rhythm.     Pulses: Normal pulses and intact distal pulses.     Heart sounds: Normal heart sounds.  Pulmonary:     Effort: Pulmonary effort is normal. No respiratory distress.     Breath sounds: Normal breath sounds.  Abdominal:     General: Bowel sounds are normal. There is no distension.     Palpations: Abdomen is soft.     Tenderness: There is no abdominal tenderness.  Musculoskeletal:        General: No edema. Normal range of motion.     Cervical back: Neck supple.     Right lower leg: No edema.     Left lower leg: No edema.  Lymphadenopathy:     Cervical: No cervical adenopathy.  Skin:    General: Skin is warm and dry.  Neurological:     Mental Status: She is alert. Mental status is at baseline.  Psychiatric:        Mood and Affect: Mood and affect and mood normal.        ASSESSMENT/ PLAN:  1. Diarrhea Status is without change. Will continue clear liquid diet Will get BMP  will set up IG appointment asap  MD is aware of  resident's narcotic use and is in agreement with current plan of care. We will attempt to wean resident as appropriate.  Ok Edwards NP [facility] Adult [facility]  Contact [phone] Monday through Friday 8am- 5pm  After hours call [phone]

## 2020-12-25 ENCOUNTER — Other Ambulatory Visit: Payer: Self-pay

## 2020-12-25 ENCOUNTER — Other Ambulatory Visit (HOSPITAL_COMMUNITY)
Admission: RE | Admit: 2020-12-25 | Discharge: 2020-12-25 | Disposition: A | Discharge status: Home / Self Care | Payer: Medicare PPO | Source: Skilled Nursing Facility | Attending: Adult Health | Admitting: Adult Health

## 2020-12-25 ENCOUNTER — Encounter: Payer: Self-pay | Admitting: Adult Health

## 2020-12-25 ENCOUNTER — Inpatient Hospital Stay (HOSPITAL_COMMUNITY): Payer: Medicare PPO | Attending: Hematology

## 2020-12-25 ENCOUNTER — Encounter (HOSPITAL_COMMUNITY): Payer: Self-pay

## 2020-12-25 ENCOUNTER — Non-Acute Institutional Stay (SKILLED_NURSING_FACILITY): Payer: Medicare PPO | Admitting: Adult Health

## 2020-12-25 ENCOUNTER — Inpatient Hospital Stay (HOSPITAL_COMMUNITY): Payer: Medicare PPO

## 2020-12-25 VITALS — BP 109/76 | HR 53 | Temp 97.0°F | Resp 16 | Wt 110.4 lb

## 2020-12-25 DIAGNOSIS — E1136 Type 2 diabetes mellitus with diabetic cataract: Secondary | ICD-10-CM | POA: Insufficient documentation

## 2020-12-25 DIAGNOSIS — N189 Chronic kidney disease, unspecified: Secondary | ICD-10-CM | POA: Insufficient documentation

## 2020-12-25 DIAGNOSIS — Z79899 Other long term (current) drug therapy: Secondary | ICD-10-CM | POA: Insufficient documentation

## 2020-12-25 DIAGNOSIS — R143 Flatulence: Secondary | ICD-10-CM | POA: Diagnosis not present

## 2020-12-25 DIAGNOSIS — E039 Hypothyroidism, unspecified: Secondary | ICD-10-CM | POA: Diagnosis not present

## 2020-12-25 DIAGNOSIS — C9000 Multiple myeloma not having achieved remission: Secondary | ICD-10-CM | POA: Diagnosis not present

## 2020-12-25 DIAGNOSIS — I129 Hypertensive chronic kidney disease with stage 1 through stage 4 chronic kidney disease, or unspecified chronic kidney disease: Secondary | ICD-10-CM | POA: Diagnosis not present

## 2020-12-25 DIAGNOSIS — Z5111 Encounter for antineoplastic chemotherapy: Secondary | ICD-10-CM | POA: Diagnosis not present

## 2020-12-25 DIAGNOSIS — Z905 Acquired absence of kidney: Secondary | ICD-10-CM | POA: Insufficient documentation

## 2020-12-25 DIAGNOSIS — I10 Essential (primary) hypertension: Secondary | ICD-10-CM | POA: Insufficient documentation

## 2020-12-25 DIAGNOSIS — K567 Ileus, unspecified: Secondary | ICD-10-CM

## 2020-12-25 DIAGNOSIS — R197 Diarrhea, unspecified: Secondary | ICD-10-CM | POA: Diagnosis not present

## 2020-12-25 DIAGNOSIS — E1122 Type 2 diabetes mellitus with diabetic chronic kidney disease: Secondary | ICD-10-CM | POA: Diagnosis not present

## 2020-12-25 DIAGNOSIS — F0390 Unspecified dementia without behavioral disturbance: Secondary | ICD-10-CM | POA: Insufficient documentation

## 2020-12-25 DIAGNOSIS — R634 Abnormal weight loss: Secondary | ICD-10-CM | POA: Insufficient documentation

## 2020-12-25 DIAGNOSIS — Z8572 Personal history of non-Hodgkin lymphomas: Secondary | ICD-10-CM | POA: Diagnosis not present

## 2020-12-25 DIAGNOSIS — Z09 Encounter for follow-up examination after completed treatment for conditions other than malignant neoplasm: Secondary | ICD-10-CM | POA: Diagnosis not present

## 2020-12-25 LAB — CBC WITH DIFFERENTIAL/PLATELET
Abs Immature Granulocytes: 0 10*3/uL (ref 0.00–0.07)
Basophils Absolute: 0 10*3/uL (ref 0.0–0.1)
Basophils Relative: 1 %
Eosinophils Absolute: 0.2 10*3/uL (ref 0.0–0.5)
Eosinophils Relative: 6 %
HCT: 31.2 % — ABNORMAL LOW (ref 36.0–46.0)
Hemoglobin: 10.1 g/dL — ABNORMAL LOW (ref 12.0–15.0)
Immature Granulocytes: 0 %
Lymphocytes Relative: 22 %
Lymphs Abs: 0.6 10*3/uL — ABNORMAL LOW (ref 0.7–4.0)
MCH: 33.3 pg (ref 26.0–34.0)
MCHC: 32.4 g/dL (ref 30.0–36.0)
MCV: 103 fL — ABNORMAL HIGH (ref 80.0–100.0)
Monocytes Absolute: 0.3 10*3/uL (ref 0.1–1.0)
Monocytes Relative: 11 %
Neutro Abs: 1.6 10*3/uL — ABNORMAL LOW (ref 1.7–7.7)
Neutrophils Relative %: 60 %
Platelets: 120 10*3/uL — ABNORMAL LOW (ref 150–400)
RBC: 3.03 MIL/uL — ABNORMAL LOW (ref 3.87–5.11)
RDW: 15.2 % (ref 11.5–15.5)
WBC: 2.7 10*3/uL — ABNORMAL LOW (ref 4.0–10.5)
nRBC: 0 % (ref 0.0–0.2)

## 2020-12-25 LAB — COMPREHENSIVE METABOLIC PANEL
ALT: 29 U/L (ref 0–44)
AST: 39 U/L (ref 15–41)
Albumin: 3.2 g/dL — ABNORMAL LOW (ref 3.5–5.0)
Alkaline Phosphatase: 76 U/L (ref 38–126)
Anion gap: 7 (ref 5–15)
BUN: 13 mg/dL (ref 8–23)
CO2: 24 mmol/L (ref 22–32)
Calcium: 8.7 mg/dL — ABNORMAL LOW (ref 8.9–10.3)
Chloride: 107 mmol/L (ref 98–111)
Creatinine, Ser: 1.53 mg/dL — ABNORMAL HIGH (ref 0.44–1.00)
GFR, Estimated: 35 mL/min — ABNORMAL LOW (ref >60)
Glucose, Bld: 108 mg/dL — ABNORMAL HIGH (ref 70–99)
Potassium: 3.9 mmol/L (ref 3.5–5.1)
Sodium: 138 mmol/L (ref 135–145)
Total Bilirubin: 0.6 mg/dL (ref 0.3–1.2)
Total Protein: 6.3 g/dL — ABNORMAL LOW (ref 6.5–8.1)

## 2020-12-25 LAB — BASIC METABOLIC PANEL
Anion gap: 7 (ref 5–15)
BUN: 12 mg/dL (ref 8–23)
CO2: 24 mmol/L (ref 22–32)
Calcium: 8.6 mg/dL — ABNORMAL LOW (ref 8.9–10.3)
Chloride: 110 mmol/L (ref 98–111)
Creatinine, Ser: 1.3 mg/dL — ABNORMAL HIGH (ref 0.44–1.00)
GFR, Estimated: 43 mL/min — ABNORMAL LOW (ref >60)
Glucose, Bld: 76 mg/dL (ref 70–99)
Potassium: 4.1 mmol/L (ref 3.5–5.1)
Sodium: 141 mmol/L (ref 135–145)

## 2020-12-25 MED ORDER — DEXAMETHASONE 4 MG PO TABS
10.0000 mg | ORAL_TABLET | Freq: Once | ORAL | Status: AC
  Administered 2020-12-25: 10 mg via ORAL

## 2020-12-25 MED ORDER — BORTEZOMIB CHEMO SQ INJECTION 3.5 MG (2.5MG/ML)
1.3000 mg/m2 | Freq: Once | INTRAMUSCULAR | Status: AC
  Administered 2020-12-25: 2 mg via SUBCUTANEOUS
  Filled 2020-12-25: qty 0.8

## 2020-12-25 MED ORDER — DEXAMETHASONE 4 MG PO TABS
ORAL_TABLET | ORAL | Status: AC
  Filled 2020-12-25: qty 3

## 2020-12-25 NOTE — Patient Instructions (Signed)
[facility] Discharge Instructions for Patients Receiving Chemotherapy   Beginning January 23rd 2017 lab work for the Cancer Center will be done in the  Main lab at [facility] on 1st floor. If you have a lab appointment with the Cancer Center please come in thru the  Main Entrance and check in at the main information desk   Today you received the following chemotherapy agents Velcade injection. Follow-up as scheduled  To help prevent nausea and vomiting after your treatment, we encourage you to take your nausea medication   If you develop nausea and vomiting, or diarrhea that is not controlled by your medication, call the clinic.  The clinic phone number is [phone]. Office hours are Monday-Friday 8:30am-5:00pm.  BELOW ARE SYMPTOMS THAT SHOULD BE REPORTED IMMEDIATELY:  *FEVER GREATER THAN 101.0 F  *CHILLS WITH OR WITHOUT FEVER  NAUSEA AND VOMITING THAT IS NOT CONTROLLED WITH YOUR NAUSEA MEDICATION  *UNUSUAL SHORTNESS OF BREATH  *UNUSUAL BRUISING OR BLEEDING  TENDERNESS IN MOUTH AND THROAT WITH OR WITHOUT PRESENCE OF ULCERS  *URINARY PROBLEMS  *BOWEL PROBLEMS  UNUSUAL RASH Items with * indicate a potential emergency and should be followed up as soon as possible. If you have an emergency after office hours please contact your primary care physician or go to the nearest emergency department.  Please call the clinic during office hours if you have any questions or concerns.   You may also contact the Patient Navigator at [phone] should you have any questions or need assistance in obtaining follow up care.      Resources For Cancer Patients and their Caregivers ? American Cancer Society: Can assist with transportation, wigs, general needs, runs Look Good Feel Better.        1-888-227-6333 ? Cancer Care: Provides financial assistance, online support groups, medication/co-pay assistance.  1-800-813-HOPE (4673) ? [facility] Assists [place] cancer patients and their families through emotional , educational and financial support.  336-427-4357 ? [place] DSS Where to apply for food stamps, Medicaid and utility assistance. 336-342-1394 ? [facility]: Transportation to medical appointments. 336-347-2287 ? Social Security Administration: May apply for disability if have a Stage IV cancer. 336-342-7796 1-800-772-1213 ? [place] Aging, Disability and Transit Services: Assists with nutrition, care and transit needs. 336-349-2343         

## 2020-12-25 NOTE — Progress Notes (Signed)
Amanda Wu reviewed with Dr. Delton Coombes and pt approved for Velcade injection today per MD                                                       [facility] [place] tolerated Velcade injection well without complaints or incident. VSS Pt discharged via wheelchair in satisfactory condition

## 2020-12-25 NOTE — Progress Notes (Signed)
Location:  [place] Room Number: 148-W Place of Service:  SNF (31)   CODE STATUS: Full Code   Allergies  Allergen Reactions  . Lotensin [Benazepril]     Chief Complaint  Patient presents with  . Acute Visit    Follow-up on KUB    HPI:  She is continuing to have diarrheal stools; is incontinent of stool. She continues to deny any abdominal pain; no nausea or vomiting. She is presently on a clear liquid diet and is taking questran twice daily. Her KUB today demonstrates resolution of her ileus   Past Medical History:  Diagnosis Date  . Benign hypertension   . Cataract   . 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 Diabetes mellitus without complication ([place])   . H/O partial nephrectomy   . Hypokalemia   . Hypothyroidism   . Impaired cognition   . Multiple myeloma (H[facility])    Dr Maylon Peppers, [facility]  . Thyroid disease     Past Surgical History:  Procedure Laterality Date  . ABDOMINAL HYSTERECTOMY    . CRANIOTOMY     for lymphoma  . YAG LASER APPLICATION Right 05/02/8415   Procedure: YAG LASER APPLICATION;  Surgeon: [name], MD;  Location: [facility];  Service: Ophthalmology;  Laterality: Right;    Social History   Socioeconomic History  . Marital status: Single    Spouse name: Not on file  . Number of children: Not on file  . Years of education: Not on file  . Highest education level: Not on file  Occupational History  . Occupation: retired   Tobacco Use  . Smoking status: Never Smoker  . Smokeless tobacco: Never Used  Vaping Use  . Vaping Use: Never used  Substance and Sexual Activity  . Alcohol use: No  . Drug use: No  . Sexual activity: Never  Other Topics Concern  . Not on file  Social History Narrative   Long term resident of [facility]    Social Determinants of Health   Financial Resource Strain: Low Risk   . Difficulty of Paying Living Expenses: Not very hard  Food Insecurity: No Food Insecurity  . Worried About Sales executive in the Last Year: Never true  . Ran Out of Food in the Last Year: Never true  Transportation Needs: No Transportation Needs  . Lack of Transportation (Medical): No  . Lack of Transportation (Non-Medical): No  Physical Activity: Inactive  . Days of Exercise per Week: 0 days  . Minutes of Exercise per Session: 0 min  Stress: No Stress Concern Present  . Feeling of Stress : Not at all  Social Connections: Moderately Isolated  . Frequency of Communication with Friends and Family: More than three times a week  . Frequency of Social Gatherings with Friends and Family: Once a week  . Attends Religious Services: More than 4 times per year  . Active Member of Clubs or Organizations: No  . Attends Archivist Meetings: Never  . Marital Status: Never married  Intimate Partner Violence: Not on file   Family History  Problem Relation Age of Onset  . Depression Mother   . Diabetes Mother   . Heart disease Father   . Cancer - Prostate Brother   . Cancer Brother   . Cancer Sister       VITAL SIGNS BP 102/70   Pulse 89   Temp 98 F (36.7 C)   Resp 20   Ht  '5\' 9"'  (1.753 m)   Wt 110 lb (49.9 kg)   SpO2 97%   BMI 16.24 kg/m   Facility-Administered Encounter Medications as of 12/25/2020  Medication  . prochlorperazine (COMPAZINE) tablet 10 mg   Outpatient Encounter Medications as of 12/25/2020  Medication Sig  . acetaminophen (TYLENOL) 500 MG tablet Take 1,000 mg by mouth every 6 (six) hours as needed for mild pain.   Marland Kitchen acyclovir (ZOVIRAX) 400 MG tablet TAKE 1 TABLET BY MOUTH TWICE DAILY  . aspirin EC 81 MG tablet Take 81 mg by mouth daily.  . Calcium Carbonate-Vitamin D (CALCIUM-VITAMIN D3 PO) Take 400 Units by mouth daily in the afternoon.  . cholestyramine (QUESTRAN) 4 g packet Take 4 g by mouth 2 (two) times daily.  . Lactobacillus Probiotic TABS Take 2 tablets by mouth daily.  Marland Kitchen lenalidomide (REVLIMID) 10 MG capsule TAKE 1 CAPSULE BY MOUTH  DAILY FOR 14 DAYS, THEN 14   DAYS OFF  . levothyroxine (SYNTHROID, LEVOTHROID) 25 MCG tablet Take 25 mcg by mouth daily before breakfast.  . loperamide (IMODIUM A-D) 2 MG tablet Take 4 mg by mouth 2 (two) times daily as needed for diarrhea or loose stools.  Marland Kitchen losartan (COZAAR) 50 MG tablet Take 1 tablet (50 mg total) by mouth daily.  . Multiple Vitamins-Minerals (MULTIVITAMIN WOMEN 50+ PO) Take by mouth daily.   . NON FORMULARY Clear Liquid Diet  . NON FORMULARY Wanderguard #1335 to ankle for safety awareness. Check placement and function qshift. Every Shift Day, Evening, Night  . potassium chloride SA (KLOR-CON) 20 MEQ tablet Take 20 mEq by mouth daily.      SIGNIFICANT DIAGNOSTIC EXAMS   PREVIOUS   08-10-20: t score: -3.758  12-05-20: EKG: sinus bradycardia with first degree AV block pvc pac   TODAY  12-21-20: KUBThere is a mild ileus noted. Mild to moderate increase feces in the colon. No renal stone is seen  12-25-20: KUB  There is a nonspecific bowel gas pattern without obstruction. No residual ileus is noted. Mild increased feces in the colon. No renal stone is seen.   LABS REVIEWED PREVIOUS    07-20-20: vit B 12: 620  07-23-20: tsh 2.264 07-24-20: wbc 3.9; hgb 9.3; hct 29.3 mcv 105.4 plt 105; glucose 106; bun 25; creat 1.28; k+ 3.6; na++ 142; a 9.2 liver normal albumin 3.5  07-30-20: chol 116; ldl 49; trig 71; hdl 53; hgb a1c 5.1; hep C neg  08-07-20: wbc 3.5; hgb 9.0; hct 27.9; mcv 104.5 plt 112; glucose 86; bun 27; creat 1.22; k+ 4.2; an++ 143; ca 9.0 liver normal albumin 3.2 08-14-20: wbc 2.8; hgb 9.4; hct 29.2; mcv 104.3 plt 101; glucose 71; bun 27; creat 1.41; k+ 4.3; na++ 140; ca 9.0 liver normal albumin 3.1  08-16-20: urine micro-albumin 9.3 08-21-20: wbc 3.9; hgb 9.5; hct 29.7 mcv 104.2 plt 86; glucose 86; bun 18; creat 1.09; k+ 3.7; na++ 144; ca 8.1; liver normal albumin 3.1  09-18-20: wbc 4.4; hgb 9.6; hct 29.7 mcv 102.4 plt 105; glucose 99; bun 20; creat 1.12; k+ 3.2; na++ 142; ca 8.8 liver normal  albumin 3.2  10-16-20: wbc 3.9; hgb 10.2; hct 32.3; mcv 104.9 plt 95; glucose 98; bun 19; creat 1.25; k+ 3.9; na++ 139; ca 8.8 liver normal albumin 3.2  12-04-20: wbc 3.1; hgb 10.9; hct 34.6; mcv 103.0 plt 130; glucose 132; bun 27; creat 1.35; k+ 3.6; na++ 143; ca 9.3 liver normal albumin 3.5   TODAY  12-25-20: wbc 2.7; hgb 10.1; hct  31.2 mcv 103.0 plt 120; glucose 108; bun 13; creat 1.53; k+ 3.9; na++ 138; ca 8.7 liver normal albumin 3.2 GFR 35  Review of Systems  Constitutional: Negative for malaise/fatigue.  Respiratory: Negative for cough and shortness of breath.   Cardiovascular: Negative for chest pain, palpitations and leg swelling.  Gastrointestinal: Positive for diarrhea. Negative for abdominal pain, blood in stool, heartburn, nausea and vomiting.  Musculoskeletal: Negative for back pain, joint pain and myalgias.  Skin: Negative.   Neurological: Negative for dizziness.  Psychiatric/Behavioral: The patient is not nervous/anxious.     Physical Exam Constitutional:      General: She is not in acute distress.    Appearance: She is well-nourished. She is cachectic. She is not diaphoretic.  Neck:     Thyroid: No thyromegaly.  Cardiovascular:     Rate and Rhythm: Normal rate and regular rhythm.     Pulses: Normal pulses and intact distal pulses.     Heart sounds: Normal heart sounds.  Pulmonary:     Effort: Pulmonary effort is normal. No respiratory distress.     Breath sounds: Normal breath sounds.  Abdominal:     General: Bowel sounds are normal. There is no distension.     Palpations: Abdomen is soft.     Tenderness: There is no abdominal tenderness.  Musculoskeletal:        General: No edema. Normal range of motion.     Cervical back: Neck supple.     Right lower leg: No edema.     Left lower leg: No edema.  Lymphadenopathy:     Cervical: No cervical adenopathy.  Skin:    General: Skin is warm and dry.  Neurological:     Mental Status: She is alert. Mental status is  at baseline.  Psychiatric:        Mood and Affect: Mood and affect and mood normal.       ASSESSMENT/ PLAN:  TODAY  1. Ileus: has resolved will advance her diet to her previous one Will continue questran twice daily  Will monitor her status.   MD is aware of resident's narcotic use and is in agreement with current plan of care. We will attempt to wean resident as appropriate.  Ok Edwards NP [facility]  Contact [phone] Monday through Friday 8am- 5pm  After hours call [phone]

## 2020-12-26 DIAGNOSIS — Z1159 Encounter for screening for other viral diseases: Secondary | ICD-10-CM | POA: Diagnosis not present

## 2020-12-26 DIAGNOSIS — C8590 Non-Hodgkin lymphoma, unspecified, unspecified site: Secondary | ICD-10-CM | POA: Diagnosis not present

## 2020-12-27 ENCOUNTER — Ambulatory Visit (INDEPENDENT_AMBULATORY_CARE_PROVIDER_SITE_OTHER): Payer: Medicare PPO | Admitting: Gastroenterology

## 2020-12-27 ENCOUNTER — Encounter: Payer: Self-pay | Admitting: Gastroenterology

## 2020-12-27 VITALS — BP 108/63 | HR 54 | Temp 95.9°F | Ht 69.0 in | Wt 106.6 lb

## 2020-12-27 DIAGNOSIS — R197 Diarrhea, unspecified: Secondary | ICD-10-CM | POA: Diagnosis not present

## 2020-12-27 NOTE — Progress Notes (Signed)
Referring Provider: Gerlene Fee, NP Primary Care Physician:  Amanda Fee, NP Primary GI: Dr. Abbey Wu  Chief Complaint  Patient presents with  . Diarrhea    HPI:   Amanda Wu is a 75 y.o. female presenting today in follow-up for diarrhea. Last seen in Dec 2021 with concerns for diarrhea. Resident of [facility].  Patient has history of Multiple Myeloma and receives treatment through Dr. Delton Wu (Revlimid 110m 2 weeks on/2 weeks off along with Velcade 3 weeks on/1 week off. Dexamethasone 156mon days of Velcade). 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   Diarrhea suspected to be possibly related to Velcade and/or Revlimid. [facility]. Cdiff not completed in our system. However[facility] multiple times and negative historically.   Meds for diarrhea include Imodium 2 mg po BID prn, probiotic, Questran 4 grams BID started on 1/7,  Spoke with Amanda EdwardsNP, regarding diarrhea. She continues to have diarrhea. Ileus a few days ago that was very mild and now resolved. Good appetite. Spoke with nursing staff who report 2 loose stools that were large yesterday. Nursing staff state following a dairy-free diet, which has helped some. Still with diarrhea. Questran started a few days ago. Eating well.   Patient is oriented to person only. Denies weakness. States she has a good appetite. No abdominal pain or reflux. Denies rectal bleeding. History of smoking noted. Believes it is Jan 2000, that [place].   Past Medical History:  Diagnosis Date  . Benign hypertension   . Cataract   . Central nervous system lymphoma ([place]  . Diabetes mellitus without complication ([place]  . H/O partial nephrectomy   . Hypokalemia   . Hypothyroidism   . Impaired cognition   . Multiple myeloma  (HCC)    [facility]. Thyroid disease     Past Surgical History:  Procedure Laterality Date  . ABDOMINAL HYSTERECTOMY    . CRANIOTOMY     for lymphoma  . YAG LASER APPLICATION Right 06/19/79/9983 Procedure: YAG LASER APPLICATION;  Surgeon: Amanda CheMD;  Location: [facility];  Service: Ophthalmology;  Laterality: Right;    Outpatient Meds:  Acetaminophen 500 mg every 6 hours Acyclovir 400 mg BID Aspirin 81 mg daily Calcium with Vit D once daily Cozaar 50 mg daily Imodium 2 mg BID prn MVI onc daily Potassium chloride 20 mEq daily Probiotic once daily Questran 4 mg BID Revlimid 10 mg capsule once daily for 14 days then hold for 14 days Synthroid 25 mcg daily    Allergies as of 12/27/2020 - Review Complete 12/27/2020  Allergen Reaction Noted  . Lotensin [benazepril]  12/05/2020    Family History  Problem Relation Age of Onset  . Depression Mother   . Diabetes Mother   . Heart disease Father   . Cancer - Prostate Brother   . Cancer Brother   . Cancer Sister     Social History   Socioeconomic History  . Marital status: Single    Spouse name: Not on file  . Number of children: Not on file  . Years of education: Not on file  . Highest education level: Not on file  Occupational History  . Occupation: retired   Tobacco Use  . Smoking status:  Never Smoker  . Smokeless tobacco: Never Used  Vaping Use  . Vaping Use: Never used  Substance and Sexual Activity  . Alcohol use: No  . Drug use: No  . Sexual activity: Never  Other Topics Concern  . Not on file  Social History Narrative   Long term resident of [facility]    Social Determinants of Health   Financial Resource Strain: Low Risk   . Difficulty of Paying Living Expenses: Not very hard  Food Insecurity: No Food Insecurity  . Worried About Charity fundraiser in the Last Year: Never true  . Ran Out of Food in the Last Year: Never true  Transportation Needs: No Transportation Needs  . Lack of  Transportation (Medical): No  . Lack of Transportation (Non-Medical): No  Physical Activity: Inactive  . Days of Exercise per Week: 0 days  . Minutes of Exercise per Session: 0 min  Stress: No Stress Concern Present  . Feeling of Stress : Not at all  Social Connections: Moderately Isolated  . Frequency of Communication with Friends and Family: More than three times a week  . Frequency of Social Gatherings with Friends and Family: Once a week  . Attends Religious Services: More than 4 times per year  . Active Member of Clubs or Organizations: No  . Attends Archivist Meetings: Never  . Marital Status: Never married    Review of Systems: Limited due to cognitive status.   Physical Exam: BP 108/63   Pulse (!) 54   Temp (!) 95.9 F (35.5 C) (Temporal)   Ht _0  (1.753 m)   Wt 106 lb 9.6 oz (48.4 kg)   BMI 15.74 kg/m  General:   Alert and oriented to person only. Very pleasant Head:  Normocephalic and atraumatic. Eyes:  Conjuctiva clear without scleral icterus. Mouth:  Mask in place Abdomen:  +BS, soft, non-tender and non-distended. Limited with patient in wheelchair Msk:  Symmetrical without gross deformities. Normal posture. Extremities:  Without edema. Neurologic:  Does not know year or city. Knows the President. Believes it is Jan 2000.  Psych:  Alert and cooperative. Normal mood and affect.  ASSESSMENT: Amanda Wu is a delightful 75 y.o. female resident at the [facility] presenting with chronic diarrhea dating back to at least Aug 2021. She is oriented to person only but denies any concerns today. I spoke with nursing staff and Ok Edwards, NP, for history.   Appears she continues to have diarrhea, and has had negative GI pathogen panel and reportedly negative Cdiff per NP. Highly suspect diarrhea related to med effect (Revlimid and/or Velcade). Currently, she is taking Questran 4 grams BID and Imodium prn without significant improvement.   Will trial  empiric Creon 36,000 units, taking 2 capsules with food and 1 with snacks, limiting to 8 capsules per day. I have asked Ok Edwards, NP, to call me in a week with an update. If no improvement, would be best to have scheduled Imodium instead of prn.    PLAN:  Trial of Creon Progress report in 1 week Return in April 2022   Annitta Needs, PhD, [facility]

## 2020-12-27 NOTE — Patient Instructions (Signed)
Continue lactose-free diet.  I have added Creon 36,000 units , take 2 WITH meals and 1 WITH snacks (no more than 8 capsules per day) to your regimen.   We may need to do a scheduled dosing of Imodium instead of prn. Please call in 1 week with an update.  Annitta Needs, PhD, ANP-BC [facility]

## 2021-01-01 ENCOUNTER — Other Ambulatory Visit: Payer: Self-pay

## 2021-01-01 ENCOUNTER — Inpatient Hospital Stay (HOSPITAL_COMMUNITY): Payer: Medicare PPO

## 2021-01-01 ENCOUNTER — Encounter (HOSPITAL_COMMUNITY): Payer: Self-pay

## 2021-01-01 VITALS — BP 139/62 | HR 78 | Temp 96.8°F | Resp 16 | Wt 109.3 lb

## 2021-01-01 DIAGNOSIS — Z5111 Encounter for antineoplastic chemotherapy: Secondary | ICD-10-CM | POA: Diagnosis not present

## 2021-01-01 DIAGNOSIS — C9000 Multiple myeloma not having achieved remission: Secondary | ICD-10-CM

## 2021-01-01 DIAGNOSIS — E1136 Type 2 diabetes mellitus with diabetic cataract: Secondary | ICD-10-CM | POA: Diagnosis not present

## 2021-01-01 DIAGNOSIS — E1122 Type 2 diabetes mellitus with diabetic chronic kidney disease: Secondary | ICD-10-CM | POA: Diagnosis not present

## 2021-01-01 DIAGNOSIS — I129 Hypertensive chronic kidney disease with stage 1 through stage 4 chronic kidney disease, or unspecified chronic kidney disease: Secondary | ICD-10-CM | POA: Diagnosis not present

## 2021-01-01 DIAGNOSIS — R197 Diarrhea, unspecified: Secondary | ICD-10-CM | POA: Diagnosis not present

## 2021-01-01 DIAGNOSIS — R634 Abnormal weight loss: Secondary | ICD-10-CM | POA: Diagnosis not present

## 2021-01-01 DIAGNOSIS — F0390 Unspecified dementia without behavioral disturbance: Secondary | ICD-10-CM | POA: Diagnosis not present

## 2021-01-01 DIAGNOSIS — E039 Hypothyroidism, unspecified: Secondary | ICD-10-CM | POA: Diagnosis not present

## 2021-01-01 LAB — COMPREHENSIVE METABOLIC PANEL
ALT: 21 U/L (ref 0–44)
AST: 30 U/L (ref 15–41)
Albumin: 3.4 g/dL — ABNORMAL LOW (ref 3.5–5.0)
Alkaline Phosphatase: 55 U/L (ref 38–126)
Anion gap: 6 (ref 5–15)
BUN: 27 mg/dL — ABNORMAL HIGH (ref 8–23)
CO2: 24 mmol/L (ref 22–32)
Calcium: 9.3 mg/dL (ref 8.9–10.3)
Chloride: 114 mmol/L — ABNORMAL HIGH (ref 98–111)
Creatinine, Ser: 1.24 mg/dL — ABNORMAL HIGH (ref 0.44–1.00)
GFR, Estimated: 45 mL/min — ABNORMAL LOW (ref >60)
Glucose, Bld: 87 mg/dL (ref 70–99)
Potassium: 3.8 mmol/L (ref 3.5–5.1)
Sodium: 144 mmol/L (ref 135–145)
Total Bilirubin: 0.4 mg/dL (ref 0.3–1.2)
Total Protein: 6.2 g/dL — ABNORMAL LOW (ref 6.5–8.1)

## 2021-01-01 LAB — CBC WITH DIFFERENTIAL/PLATELET
Abs Immature Granulocytes: 0 10*3/uL (ref 0.00–0.07)
Basophils Absolute: 0.1 10*3/uL (ref 0.0–0.1)
Basophils Relative: 2 %
Eosinophils Absolute: 0.1 10*3/uL (ref 0.0–0.5)
Eosinophils Relative: 2 %
HCT: 32.1 % — ABNORMAL LOW (ref 36.0–46.0)
Hemoglobin: 10.6 g/dL — ABNORMAL LOW (ref 12.0–15.0)
Immature Granulocytes: 0 %
Lymphocytes Relative: 21 %
Lymphs Abs: 0.5 10*3/uL — ABNORMAL LOW (ref 0.7–4.0)
MCH: 33.2 pg (ref 26.0–34.0)
MCHC: 33 g/dL (ref 30.0–36.0)
MCV: 100.6 fL — ABNORMAL HIGH (ref 80.0–100.0)
Monocytes Absolute: 0.4 10*3/uL (ref 0.1–1.0)
Monocytes Relative: 17 %
Neutro Abs: 1.4 10*3/uL — ABNORMAL LOW (ref 1.7–7.7)
Neutrophils Relative %: 58 %
Platelets: 87 10*3/uL — ABNORMAL LOW (ref 150–400)
RBC: 3.19 MIL/uL — ABNORMAL LOW (ref 3.87–5.11)
RDW: 15.5 % (ref 11.5–15.5)
WBC: 2.5 10*3/uL — ABNORMAL LOW (ref 4.0–10.5)
nRBC: 0 % (ref 0.0–0.2)

## 2021-01-01 LAB — LACTATE DEHYDROGENASE: LDH: 189 U/L (ref 98–192)

## 2021-01-01 MED ORDER — DEXAMETHASONE 4 MG PO TABS
ORAL_TABLET | ORAL | Status: AC
  Filled 2021-01-01: qty 3

## 2021-01-01 MED ORDER — BORTEZOMIB CHEMO SQ INJECTION 3.5 MG (2.5MG/ML)
1.3000 mg/m2 | Freq: Once | INTRAMUSCULAR | Status: AC
  Administered 2021-01-01: 2 mg via SUBCUTANEOUS
  Filled 2021-01-01: qty 0.8

## 2021-01-01 MED ORDER — DEXAMETHASONE 4 MG PO TABS
10.0000 mg | ORAL_TABLET | Freq: Once | ORAL | Status: AC
  Administered 2021-01-01: 10 mg via ORAL

## 2021-01-01 NOTE — Progress Notes (Signed)
Patient is here today for D8 Velcade injection.  She arrived via wheelchair from the nursing home.  She answers questions appropriately.  She denies any issues at this time.  According to the nursing home records, she is taking Revlimid and hasn't missed any doses.  Labs reviewed with Dr. Delton Coombes and [place] 1.4 today.  Dr. Delton Coombes okay to proceed with treatment today.    Patient tolerated injection without incidence.  She will follow up as scheduled.  Discharged in stable condition via wheelchair from clinic with aide from nursing home.

## 2021-01-01 NOTE — Patient Instructions (Signed)
You received your velcade injection today. Follow up as scheduled.

## 2021-01-02 ENCOUNTER — Non-Acute Institutional Stay (SKILLED_NURSING_FACILITY): Payer: Medicare PPO | Admitting: Adult Health

## 2021-01-02 ENCOUNTER — Encounter: Payer: Self-pay | Admitting: Adult Health

## 2021-01-02 DIAGNOSIS — R001 Bradycardia, unspecified: Secondary | ICD-10-CM | POA: Diagnosis not present

## 2021-01-02 DIAGNOSIS — C8590 Non-Hodgkin lymphoma, unspecified, unspecified site: Secondary | ICD-10-CM | POA: Diagnosis not present

## 2021-01-02 DIAGNOSIS — Z1159 Encounter for screening for other viral diseases: Secondary | ICD-10-CM | POA: Diagnosis not present

## 2021-01-02 DIAGNOSIS — I440 Atrioventricular block, first degree: Secondary | ICD-10-CM | POA: Diagnosis not present

## 2021-01-02 DIAGNOSIS — I700 Atherosclerosis of aorta: Secondary | ICD-10-CM

## 2021-01-02 DIAGNOSIS — K8689 Other specified diseases of pancreas: Secondary | ICD-10-CM

## 2021-01-02 DIAGNOSIS — K567 Ileus, unspecified: Secondary | ICD-10-CM | POA: Insufficient documentation

## 2021-01-02 LAB — PROTEIN ELECTROPHORESIS, SERUM
A/G Ratio: 1.1 (ref 0.7–1.7)
Albumin ELP: 3.1 g/dL (ref 2.9–4.4)
Alpha-1-Globulin: 0.2 g/dL (ref 0.0–0.4)
Alpha-2-Globulin: 0.6 g/dL (ref 0.4–1.0)
Beta Globulin: 0.7 g/dL (ref 0.7–1.3)
Gamma Globulin: 1.1 g/dL (ref 0.4–1.8)
Globulin, Total: 2.7 g/dL (ref 2.2–3.9)
M-Spike, %: 0.4 g/dL — ABNORMAL HIGH
Total Protein ELP: 5.8 g/dL — ABNORMAL LOW (ref 6.0–8.5)

## 2021-01-02 LAB — KAPPA/LAMBDA LIGHT CHAINS
Kappa free light chain: 21.3 mg/L — ABNORMAL HIGH (ref 3.3–19.4)
Kappa, lambda light chain ratio: 1.09 (ref 0.26–1.65)
Lambda free light chains: 19.6 mg/L (ref 5.7–26.3)

## 2021-01-02 NOTE — Progress Notes (Signed)
Location:  [facility] Room Number: 148/W Place of Service:  SNF (31)   CODE STATUS: Full Code  Allergies  Allergen Reactions  . Lotensin [Benazepril]     Chief Complaint  Patient presents with  . Medical Management of Chronic Issues         Aortic atherosclerosis:    First degree av block/bradycardia:  Pancreatic insuffiencey:     HPI:  She is a 75 year old long term resident of this facility being seen for the management of her chronic illnesses: Aortic atherosclerosis:    First degree av block/bradycardia:  Pancreatic insuffiencey. Her diarrheal stools are slowly improving. She denies any pain. She denies any heart burn. She denies weakness or fatigue.  She has been seen by GI for her diarrhea she is now being treated for pancreatic insufficiency she was treated for an ileus this past month without complications. She is also on a dairy free diet as well.   Past Medical History:  Diagnosis Date  . Benign hypertension   . Cataract   . 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 Diabetes mellitus without complication ([place])   . H/O partial nephrectomy   . Hypokalemia   . Hypothyroidism   . Impaired cognition   . Multiple myeloma ([facility])    Dr Maylon Peppers, [facility]  . Thyroid disease     Past Surgical History:  Procedure Laterality Date  . ABDOMINAL HYSTERECTOMY    . CRANIOTOMY     for lymphoma  . YAG LASER APPLICATION Right 1/88/4166   Procedure: YAG LASER APPLICATION;  Surgeon: [name], MD;  Location: [facility];  Service: Ophthalmology;  Laterality: Right;    Social History   Socioeconomic History  . Marital status: Single    Spouse name: Not on file  . Number of children: Not on file  . Years of education: Not on file  . Highest education level: Not on file  Occupational History  . Occupation: retired   Tobacco Use  . Smoking status: Never Smoker  . Smokeless tobacco: Never Used  Vaping Use  . Vaping Use: Never used  Substance and Sexual  Activity  . Alcohol use: No  . Drug use: No  . Sexual activity: Never  Other Topics Concern  . Not on file  Social History Narrative   Long term resident of [facility]    Social Determinants of Health   Financial Resource Strain: Low Risk   . Difficulty of Paying Living Expenses: Not very hard  Food Insecurity: No Food Insecurity  . Worried About Charity fundraiser in the Last Year: Never true  . Ran Out of Food in the Last Year: Never true  Transportation Needs: No Transportation Needs  . Lack of Transportation (Medical): No  . Lack of Transportation (Non-Medical): No  Physical Activity: Inactive  . Days of Exercise per Week: 0 days  . Minutes of Exercise per Session: 0 min  Stress: No Stress Concern Present  . Feeling of Stress : Not at all  Social Connections: Moderately Isolated  . Frequency of Communication with Friends and Family: More than three times a week  . Frequency of Social Gatherings with Friends and Family: Once a week  . Attends Religious Services: More than 4 times per year  . Active Member of Clubs or Organizations: No  . Attends Archivist Meetings: Never  . Marital Status: Never married  Intimate Partner Violence: Not on file   Family History  Problem Relation Age of Onset  . Depression Mother   . Diabetes Mother   . Heart disease Father   . Cancer - Prostate Brother   . Cancer Brother   . Cancer Sister       VITAL SIGNS BP 122/77   Pulse 70   Temp 98.6 F (37 C)   Ht '5\' 9"'  (1.753 m)   Wt 110 lb 3.2 oz (50 kg)   BMI 16.27 kg/m   Facility-Administered Encounter Medications as of 01/02/2021  Medication  . prochlorperazine (COMPAZINE) tablet 10 mg   Outpatient Encounter Medications as of 01/02/2021  Medication Sig  . acetaminophen (TYLENOL) 500 MG tablet Take 1,000 mg by mouth every 6 (six) hours as needed for mild pain.   Marland Kitchen acyclovir (ZOVIRAX) 400 MG tablet TAKE 1 TABLET BY MOUTH TWICE DAILY  . aspirin EC 81 MG tablet Take 81 mg by  mouth daily.  . Calcium Carbonate-Vitamin D (CALCIUM-VITAMIN D3 PO) Take 400 Units by mouth daily in the afternoon.  . cholestyramine (QUESTRAN) 4 g packet Take 4 g by mouth 2 (two) times daily.  . Lactobacillus Probiotic TABS Take 2 tablets by mouth daily.  Marland Kitchen lenalidomide (REVLIMID) 10 MG capsule TAKE 1 CAPSULE BY MOUTH  DAILY FOR 14 DAYS, THEN 14  DAYS OFF  . levothyroxine (SYNTHROID, LEVOTHROID) 25 MCG tablet Take 25 mcg by mouth daily before breakfast.  . lipase/protease/amylase (CREON) 36000 UNITS CPEP capsule Take 72,000 Units by mouth 3 (three) times daily with meals.  Marland Kitchen loperamide (IMODIUM A-D) 2 MG tablet Take 4 mg by mouth 2 (two) times daily as needed for diarrhea or loose stools.  Marland Kitchen losartan (COZAAR) 50 MG tablet Take 1 tablet (50 mg total) by mouth daily.  . Multiple Vitamins-Minerals (MULTIVITAMIN WOMEN 50+ PO) Take by mouth daily.   . NON FORMULARY Regular diet no dairy products.  . NON FORMULARY Wanderguard [phone] to ankle for safety awareness. Check placement and function qshift. Every Shift Day, Evening, Night  . potassium chloride SA (KLOR-CON) 20 MEQ tablet Take 20 mEq by mouth daily.      SIGNIFICANT DIAGNOSTIC EXAMS  PREVIOUS   08-10-20: t score: -3.758  12-05-20: EKG: sinus bradycardia with first degree AV block pvc pac   12-21-20: KUBThere is a mild ileus noted. Mild to moderate increase feces in the colon. No renal stone is seen  12-25-20: KUB  There is a nonspecific bowel gas pattern without obstruction. No residual ileus is noted. Mild increased feces in the colon. No renal stone is seen.  NO NEW EXAMS    LABS REVIEWED PREVIOUS    07-20-20: vit B 12: 620  07-23-20: tsh 2.264 07-24-20: wbc 3.9; hgb 9.3; hct 29.3 mcv 105.4 plt 105; glucose 106; bun 25; creat 1.28; k+ 3.6; na++ 142; a 9.2 liver normal albumin 3.5  07-30-20: chol 116; ldl 49; trig 71; hdl 53; hgb a1c 5.1; hep C neg  08-07-20: wbc 3.5; hgb 9.0; hct 27.9; mcv 104.5 plt 112; glucose 86; bun 27; creat  1.22; k+ 4.2; an++ 143; ca 9.0 liver normal albumin 3.2 08-14-20: wbc 2.8; hgb 9.4; hct 29.2; mcv 104.3 plt 101; glucose 71; bun 27; creat 1.41; k+ 4.3; na++ 140; ca 9.0 liver normal albumin 3.1  08-16-20: urine micro-albumin 9.3 08-21-20: wbc 3.9; hgb 9.5; hct 29.7 mcv 104.2 plt 86; glucose 86; bun 18; creat 1.09; k+ 3.7; na++ 144; ca 8.1; liver normal albumin 3.1  09-18-20: wbc 4.4; hgb 9.6; hct 29.7 mcv 102.4 plt 105;  glucose 99; bun 20; creat 1.12; k+ 3.2; na++ 142; ca 8.8 liver normal albumin 3.2  10-16-20: wbc 3.9; hgb 10.2; hct 32.3; mcv 104.9 plt 95; glucose 98; bun 19; creat 1.25; k+ 3.9; na++ 139; ca 8.8 liver normal albumin 3.2  12-04-20: wbc 3.1; hgb 10.9; hct 34.6; mcv 103.0 plt 130; glucose 132; bun 27; creat 1.35; k+ 3.6; na++ 143; ca 9.3 liver normal albumin 3.5  12-25-20: wbc 2.7; hgb 10.1; hct 31.2 mcv 103.0 plt 120; glucose 108; bun 13; creat 1.53; k+ 3.9; na++ 138; ca 8.7 liver normal albumin 3.2 GFR 35  TODAY  01-01-21: wbc 2.5; hgb 10.6; hct 32.1; mcv 100.6 plt 87; glucose 87; bun 27; creat 1.24; k+ 3.8; na++ 144; ca 9.3 GFR 45; liver normal albumin 3.4    PREVIOUS   08-10-20: t score: -3.758  NO NEW EXAMS.   LABS REVIEWED PREVIOUS    07-20-20: vit B 12: 620  07-23-20: tsh 2.264 07-24-20: wbc 3.9; hgb 9.3; hct 29.3 mcv 105.4 plt 105; glucose 106; bun 25; creat 1.28; k+ 3.6; na++ 142; a 9.2 liver normal albumin 3.5  07-30-20: chol 116; ldl 49; trig 71; hdl 53; hgb a1c 5.1; hep C neg  08-07-20: wbc 3.5; hgb 9.0; hct 27.9; mcv 104.5 plt 112; glucose 86; bun 27; creat 1.22; k+ 4.2; an++ 143; ca 9.0 liver normal albumin 3.2 08-14-20: wbc 2.8; hgb 9.4; hct 29.2; mcv 104.3 plt 101; glucose 71; bun 27; creat 1.41; k+ 4.3; na++ 140; ca 9.0 liver normal albumin 3.1  08-16-20: urine micro-albumin 9.3 08-21-20: wbc 3.9; hgb 9.5; hct 29.7 mcv 104.2 plt 86; glucose 86; bun 18; creat 1.09; k+ 3.7; na++ 144; ca 8.1; liver normal albumin 3.1  09-18-20: wbc 4.4; hgb 9.6; hct 29.7 mcv 102.4 plt 105;  glucose 99; bun 20; creat 1.12; k+ 3.2; na++ 142; ca 8.8 liver normal albumin 3.2  10-16-20: wbc 3.9; hgb 10.2; hct 32.3; mcv 104.9 plt 95; glucose 98; bun 19; creat 1.25; k+ 3.9; na++ 139; ca 8.8 liver normal albumin 3.2   NO NEW LABS.   Review of Systems  Constitutional: Negative for malaise/fatigue.  Respiratory: Negative for cough and shortness of breath.   Cardiovascular: Negative for chest pain, palpitations and leg swelling.  Gastrointestinal: Negative for abdominal pain, constipation and heartburn.  Musculoskeletal: Negative for back pain, joint pain and myalgias.  Skin: Negative.   Neurological: Negative for dizziness.  Psychiatric/Behavioral: The patient is not nervous/anxious.      Physical Exam Constitutional:      General: She is not in acute distress.    Appearance: She is well-nourished. She is cachectic. She is not diaphoretic.  Neck:     Thyroid: No thyromegaly.  Cardiovascular:     Rate and Rhythm: Regular rhythm. Bradycardia present.     Pulses: Normal pulses and intact distal pulses.     Heart sounds: Murmur heard.      Comments: 2/6 PP faint  Pulmonary:     Effort: Pulmonary effort is normal. No respiratory distress.     Breath sounds: Normal breath sounds.  Abdominal:     General: Bowel sounds are normal. There is no distension.     Palpations: Abdomen is soft.     Tenderness: There is no abdominal tenderness.  Musculoskeletal:        General: No edema. Normal range of motion.     Cervical back: Neck supple.     Right lower leg: No edema.     Left  lower leg: No edema.  Lymphadenopathy:     Cervical: No cervical adenopathy.  Skin:    General: Skin is warm and dry.  Neurological:     Mental Status: She is alert and oriented to person, place, and time. Mental status is at baseline.  Psychiatric:        Mood and Affect: Mood and affect normal.        ASSESSMENT/ PLAN:  TODAY  1. Aortic atherosclerosis: will monitor   2. First degree av  block/bradycardia: is stable has been seen by cardiology will monitor   3. Pancreatic insuffiencey: is slowly improving: will continue questran 4 gm twice daily; will continue creon 72,000 units with meals.     PREVIOUS   4. Macrocytic anemia: is stable hgb 9.6 vit B 12: 620 will is off iron due to diarrhea    5. Vascular dementia with behavioral disturbance is stable weight is 110 pounds her aricept was stopped due to side effects   6. Herpes: no recent outbreak will continue acyclovir 400 mg twice daily    7. Failure to thrive in adult is without change: weight is 110 pounds will continue supplements as directed  8. Hypokalemia: is stable k+ 3.8 will continue k+ 20 meq daily   9. Hypothyroidism  Unspecified type: is stable tsh 2.264 will continue synthroid 25 mcg daily   10. Hypertension associated with type 2 diabetes mellitus: is stable b/p 104/84 will continue cozaar 50 mg daily and asa 81 mg daily   11. Diabetes mellitus without complication: is stable hgb a1c 5.1 will monitor is on arb and asa  12. Post menopausal osteoporosis: t score -3.758 will continue calcium and vit D   13. Compression fracture of L1 vertebrae with routine healing subsequent encounter (06/2017) is stable has prn tylenol   14. Thrombocytopenia: is stable plt 105 will monitor   15. Multiple myeloma without achieving remission: is without change: is followed by oncology will ocnintue revlimid 100 mg on 14 days and off 14 days. Will monitor     MD is aware of resident's narcotic use and is in agreement with current plan of care. We will attempt to wean resident as appropriate.  Ok Edwards NP [facility]  Contact [phone] Monday through Friday 8am- 5pm  After hours call [phone]

## 2021-01-04 DIAGNOSIS — C8590 Non-Hodgkin lymphoma, unspecified, unspecified site: Secondary | ICD-10-CM | POA: Diagnosis not present

## 2021-01-04 DIAGNOSIS — Z1159 Encounter for screening for other viral diseases: Secondary | ICD-10-CM | POA: Diagnosis not present

## 2021-01-07 DIAGNOSIS — K8689 Other specified diseases of pancreas: Secondary | ICD-10-CM | POA: Insufficient documentation

## 2021-01-07 DIAGNOSIS — I700 Atherosclerosis of aorta: Secondary | ICD-10-CM | POA: Insufficient documentation

## 2021-01-07 DIAGNOSIS — Z1159 Encounter for screening for other viral diseases: Secondary | ICD-10-CM | POA: Diagnosis not present

## 2021-01-07 DIAGNOSIS — C8590 Non-Hodgkin lymphoma, unspecified, unspecified site: Secondary | ICD-10-CM | POA: Diagnosis not present

## 2021-01-08 ENCOUNTER — Inpatient Hospital Stay (HOSPITAL_BASED_OUTPATIENT_CLINIC_OR_DEPARTMENT_OTHER): Payer: Medicare PPO | Admitting: Hematology

## 2021-01-08 ENCOUNTER — Inpatient Hospital Stay (HOSPITAL_COMMUNITY): Payer: Medicare PPO

## 2021-01-08 ENCOUNTER — Other Ambulatory Visit: Payer: Self-pay

## 2021-01-08 VITALS — BP 138/84 | HR 79 | Temp 96.6°F | Resp 16 | Wt 108.6 lb

## 2021-01-08 DIAGNOSIS — E039 Hypothyroidism, unspecified: Secondary | ICD-10-CM | POA: Diagnosis not present

## 2021-01-08 DIAGNOSIS — F0390 Unspecified dementia without behavioral disturbance: Secondary | ICD-10-CM | POA: Diagnosis not present

## 2021-01-08 DIAGNOSIS — C9000 Multiple myeloma not having achieved remission: Secondary | ICD-10-CM

## 2021-01-08 DIAGNOSIS — I129 Hypertensive chronic kidney disease with stage 1 through stage 4 chronic kidney disease, or unspecified chronic kidney disease: Secondary | ICD-10-CM | POA: Diagnosis not present

## 2021-01-08 DIAGNOSIS — R634 Abnormal weight loss: Secondary | ICD-10-CM | POA: Diagnosis not present

## 2021-01-08 DIAGNOSIS — R197 Diarrhea, unspecified: Secondary | ICD-10-CM | POA: Diagnosis not present

## 2021-01-08 DIAGNOSIS — Z5111 Encounter for antineoplastic chemotherapy: Secondary | ICD-10-CM | POA: Diagnosis not present

## 2021-01-08 DIAGNOSIS — E1136 Type 2 diabetes mellitus with diabetic cataract: Secondary | ICD-10-CM | POA: Diagnosis not present

## 2021-01-08 DIAGNOSIS — E1122 Type 2 diabetes mellitus with diabetic chronic kidney disease: Secondary | ICD-10-CM | POA: Diagnosis not present

## 2021-01-08 LAB — COMPREHENSIVE METABOLIC PANEL
ALT: 31 U/L (ref 0–44)
AST: 45 U/L — ABNORMAL HIGH (ref 15–41)
Albumin: 3 g/dL — ABNORMAL LOW (ref 3.5–5.0)
Alkaline Phosphatase: 57 U/L (ref 38–126)
Anion gap: 5 (ref 5–15)
BUN: 27 mg/dL — ABNORMAL HIGH (ref 8–23)
CO2: 23 mmol/L (ref 22–32)
Calcium: 8.8 mg/dL — ABNORMAL LOW (ref 8.9–10.3)
Chloride: 113 mmol/L — ABNORMAL HIGH (ref 98–111)
Creatinine, Ser: 1.27 mg/dL — ABNORMAL HIGH (ref 0.44–1.00)
GFR, Estimated: 44 mL/min — ABNORMAL LOW (ref >60)
Glucose, Bld: 121 mg/dL — ABNORMAL HIGH (ref 70–99)
Potassium: 3.8 mmol/L (ref 3.5–5.1)
Sodium: 141 mmol/L (ref 135–145)
Total Bilirubin: 0.6 mg/dL (ref 0.3–1.2)
Total Protein: 5.9 g/dL — ABNORMAL LOW (ref 6.5–8.1)

## 2021-01-08 LAB — CBC WITH DIFFERENTIAL/PLATELET
Abs Immature Granulocytes: 0 10*3/uL (ref 0.00–0.07)
Basophils Absolute: 0 10*3/uL (ref 0.0–0.1)
Basophils Relative: 1 %
Eosinophils Absolute: 0.1 10*3/uL (ref 0.0–0.5)
Eosinophils Relative: 2 %
HCT: 32.6 % — ABNORMAL LOW (ref 36.0–46.0)
Hemoglobin: 10.6 g/dL — ABNORMAL LOW (ref 12.0–15.0)
Immature Granulocytes: 0 %
Lymphocytes Relative: 17 %
Lymphs Abs: 0.7 10*3/uL (ref 0.7–4.0)
MCH: 33.1 pg (ref 26.0–34.0)
MCHC: 32.5 g/dL (ref 30.0–36.0)
MCV: 101.9 fL — ABNORMAL HIGH (ref 80.0–100.0)
Monocytes Absolute: 0.4 10*3/uL (ref 0.1–1.0)
Monocytes Relative: 11 %
Neutro Abs: 2.7 10*3/uL (ref 1.7–7.7)
Neutrophils Relative %: 69 %
Platelets: 106 10*3/uL — ABNORMAL LOW (ref 150–400)
RBC: 3.2 MIL/uL — ABNORMAL LOW (ref 3.87–5.11)
RDW: 15.9 % — ABNORMAL HIGH (ref 11.5–15.5)
WBC: 3.9 10*3/uL — ABNORMAL LOW (ref 4.0–10.5)
nRBC: 0 % (ref 0.0–0.2)

## 2021-01-08 MED ORDER — DEXAMETHASONE 4 MG PO TABS
ORAL_TABLET | ORAL | Status: AC
  Filled 2021-01-08: qty 3

## 2021-01-08 MED ORDER — BORTEZOMIB CHEMO SQ INJECTION 3.5 MG (2.5MG/ML)
1.3000 mg/m2 | Freq: Once | INTRAMUSCULAR | Status: AC
  Administered 2021-01-08: 2 mg via SUBCUTANEOUS
  Filled 2021-01-08: qty 0.8

## 2021-01-08 MED ORDER — DEXAMETHASONE 4 MG PO TABS
10.0000 mg | ORAL_TABLET | Freq: Once | ORAL | Status: AC
  Administered 2021-01-08: 10 mg via ORAL

## 2021-01-08 NOTE — Progress Notes (Signed)
[name], [name]   CLINIC:  Medical Oncology/Hematology  PCP:  Gerlene Fee, NP [address] [place] [address] [phone]   REASON FOR VISIT:  Follow-up for multiple myeloma  PRIOR THERAPY: None  NGS Results: Not done  CURRENT THERAPY: Velcade & Decadron 3 weeks on, 1 week off; Revlimid 2 weeks on, 2 weeks off  BRIEF ONCOLOGIC HISTORY:  Oncology History  Multiple myeloma ([facility])  07/09/2017 Initial Diagnosis   Multiple myeloma ([facility])   06/01/2018 -  Chemotherapy    Patient is on Treatment Plan: MYELOMA MAINTENANCE BORTEZOMIB SQ D1,8,15 / DEXAMETHASONE ORAL D1,8,15 Q28D         CANCER STAGING: Cancer Staging No matching staging information was found for the patient.  INTERVAL HISTORY:  [name], a 75 y.o. female, returns for routine follow-up and consideration for next cycle of chemotherapy. Amanda Wu was last seen on 10/16/2020.  Due for day #15 of cycle #34 of Velcade today.   Overall, she tells me she has been feeling pretty well. She complains of having diarrhea 4-5 episodes daily according to her nurse and is taking Imodium as needed. She is not currently getting Boost/Ensure.  Overall, she feels ready for next cycle of chemo today.    REVIEW OF SYSTEMS:  Review of Systems  Constitutional: Positive for appetite change (75%) and fatigue (75%).  Gastrointestinal: Positive for diarrhea.  All other systems reviewed and are negative.   PAST MEDICAL/SURGICAL HISTORY:  Past Medical History:  Diagnosis Date  . Benign hypertension   . Cataract   . 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 Diabetes mellitus without complication ([facility])   . H/O partial nephrectomy   . Hypokalemia   . Hypothyroidism   . Impaired cognition   . Multiple myeloma ([facility])    Dr Maylon Peppers, [facility]  . Thyroid disease    Past Surgical History:  Procedure Laterality Date  . ABDOMINAL HYSTERECTOMY    . CRANIOTOMY      for lymphoma  . YAG LASER APPLICATION Right 6/80/3212   Procedure: YAG LASER APPLICATION;  Surgeon: [name], MD;  Location: [facility];  Service: Ophthalmology;  Laterality: Right;    SOCIAL HISTORY:  Social History   Socioeconomic History  . Marital status: Single    Spouse name: Not on file  . Number of children: Not on file  . Years of education: Not on file  . Highest education level: Not on file  Occupational History  . Occupation: retired   Tobacco Use  . Smoking status: Never Smoker  . Smokeless tobacco: Never Used  Vaping Use  . Vaping Use: Never used  Substance and Sexual Activity  . Alcohol use: No  . Drug use: No  . Sexual activity: Never  Other Topics Concern  . Not on file  Social History Narrative   Long term resident of [facility]    Social Determinants of Health   Financial Resource Strain: Low Risk   . Difficulty of Paying Living Expenses: Not very hard  Food Insecurity: No Food Insecurity  . Worried About Charity fundraiser in the Last Year: Never true  . Ran Out of Food in the Last Year: Never true  Transportation Needs: No Transportation Needs  . Lack of Transportation (Medical): No  . Lack of Transportation (Non-Medical): No  Physical Activity: Inactive  . Days of Exercise per Week: 0 days  . Minutes of Exercise per  Session: 0 min  Stress: No Stress Concern Present  . Feeling of Stress : Not at all  Social Connections: Moderately Isolated  . Frequency of Communication with Friends and Family: More than three times a week  . Frequency of Social Gatherings with Friends and Family: Once a week  . Attends Religious Services: More than 4 times per year  . Active Member of Clubs or Organizations: No  . Attends Archivist Meetings: Never  . Marital Status: Never married  Human resources officer Violence: Not on file    FAMILY HISTORY:  Family History  Problem Relation Age of Onset  . Depression Mother   . Diabetes Mother   . Heart disease  Father   . Cancer - Prostate Brother   . Cancer Brother   . Cancer Sister     CURRENT MEDICATIONS:  No current facility-administered medications for this visit.   Current Outpatient Medications  Medication Sig Dispense Refill  . acyclovir (ZOVIRAX) 400 MG tablet TAKE 1 TABLET BY MOUTH TWICE DAILY 60 tablet 2  . lenalidomide (REVLIMID) 10 MG capsule TAKE 1 CAPSULE BY MOUTH  DAILY FOR 14 DAYS, THEN 14  DAYS OFF 14 capsule 0   Facility-Administered Medications Ordered in Other Visits  Medication Dose Route Frequency Provider Last Rate Last Admin  . bortezomib SQ (VELCADE) chemo injection (2.63m/mL concentration) 2 mg  1.3 mg/m2 (Order-Specific) Subcutaneous Once KDerek Jack MD      . dexamethasone (DECADRON) tablet 10 mg  10 mg Oral Once KDerek Jack MD      . prochlorperazine (COMPAZINE) tablet 10 mg  10 mg Oral Once KDerek Jack MD        ALLERGIES:  Allergies  Allergen Reactions  . Lotensin [Benazepril]     PHYSICAL EXAM:  Performance status (ECOG): 2 - Symptomatic, <50% confined to bed  Vitals:   01/08/21 1359  BP: 138/84  Pulse: 79  Resp: 16  Temp: (!) 96.6 F (35.9 C)  SpO2: 100%   Wt Readings from Last 3 Encounters:  01/08/21 108 lb 9.6 oz (49.3 kg)  01/02/21 110 lb 3.2 oz (50 kg)  01/01/21 109 lb 4.8 oz (49.6 kg)   Physical Exam Vitals reviewed.  Constitutional:      Appearance: Normal appearance.     Comments: Wheelchair-bound  Cardiovascular:     Rate and Rhythm: Normal rate and regular rhythm.     Pulses: Normal pulses.     Heart sounds: Normal heart sounds.  Pulmonary:     Effort: Pulmonary effort is normal.     Breath sounds: Normal breath sounds.  Musculoskeletal:     Right lower leg: No edema.     Left lower leg: No edema.  Neurological:     General: No focal deficit present.     Mental Status: She is alert and oriented to person, place, and time.  Psychiatric:        Mood and Affect: Mood normal.        Behavior:  Behavior normal.     LABORATORY DATA:  I have reviewed the labs as listed.  CBC Latest Ref Rng & Units 01/08/2021 01/01/2021 12/25/2020  WBC 4.0 - 10.5 K/uL 3.9(L) 2.5(L) 2.7(L)  Hemoglobin 12.0 - 15.0 g/dL 10.6(L) 10.6(L) 10.1(L)  Hematocrit 36.0 - 46.0 % 32.6(L) 32.1(L) 31.2(L)  Platelets 150 - 400 K/uL 106(L) 87(L) 120(L)   CMP Latest Ref Rng & Units 01/08/2021 01/01/2021 12/25/2020  Glucose 70 - 99 mg/dL 121(H) 87 108(H)  BUN 8 -  23 mg/dL 27(H) 27(H) 13  Creatinine 0.44 - 1.00 mg/dL 1.27(H) 1.24(H) 1.53(H)  Sodium 135 - 145 mmol/L 141 144 138  Potassium 3.5 - 5.1 mmol/L 3.8 3.8 3.9  Chloride 98 - 111 mmol/L 113(H) 114(H) 107  CO2 22 - 32 mmol/L '23 24 24  ' Calcium 8.9 - 10.3 mg/dL 8.8(L) 9.3 8.7(L)  Total Protein 6.5 - 8.1 g/dL 5.9(L) 6.2(L) 6.3(L)  Total Bilirubin 0.3 - 1.2 mg/dL 0.6 0.4 0.6  Alkaline Phos 38 - 126 U/L 57 55 76  AST 15 - 41 U/L 45(H) 30 39  ALT 0 - 44 U/L '31 21 29   ' Lab Results  Component Value Date   LDH 189 01/01/2021   LDH 196 (H) 10/09/2020   LDH 183 09/04/2020   Lab Results  Component Value Date   TOTALPROTELP 5.8 (L) 01/01/2021   ALBUMINELP 3.1 01/01/2021   A1GS 0.2 01/01/2021   A2GS 0.6 01/01/2021   BETS 0.7 01/01/2021   GAMS 1.1 01/01/2021   MSPIKE 0.4 (H) 01/01/2021   SPEI Comment 01/01/2021    Lab Results  Component Value Date   KPAFRELGTCHN 21.3 (H) 01/01/2021   LAMBDASER 19.6 01/01/2021   KAPLAMBRATIO 1.09 01/01/2021    DIAGNOSTIC IMAGING:  I have independently reviewed the scans and discussed with the patient. ECHOCARDIOGRAM COMPLETE  Result Date: 12/12/2020    ECHOCARDIOGRAM REPORT   Patient Name:   JORDIS REPETTO Date of Exam: 12/12/2020 Medical Rec #:  [id number]      Height:       69.0 in Accession #:    [id number]     Weight:       110.7 lb Date of Birth:  1946/04/04      BSA:          1.607 m Patient Age:    89 years       BP:           110/50 mmHg Patient Gender: F              HR:           56 bpm. Exam Location:  [facility]  Procedure: 2D Echo, Cardiac Doppler and Color Doppler Indications:    R94.31 (ICD-10-CM) - Nonspecific abnormal electrocardiogram                 (ECG) (EKG)  History:        Patient has prior history of Echocardiogram examinations, most                 recent 07/10/2017. Risk Factors:Diabetes. Benign hypertension,                 Mild cognitive impairment with memory loss.  Sonographer:    Alvino Chapel RCS Referring Phys: [id number] PAULA V ROSS IMPRESSIONS  1. Left ventricular ejection fraction, by estimation, is 60 to 65%. The left ventricle has normal function. The left ventricle has no regional wall motion abnormalities. There is mild left ventricular hypertrophy. Left ventricular diastolic parameters are indeterminate.  2. Right ventricular systolic function is normal. The right ventricular size is normal. There is normal pulmonary artery systolic pressure.  3. The mitral valve is normal in structure. Mild mitral valve regurgitation.  4. The aortic valve is tricuspid. Aortic valve regurgitation is mild. Mild aortic valve sclerosis is present, with no evidence of aortic valve stenosis.  5. The inferior vena cava is normal in size with greater than 50% respiratory variability, suggesting right atrial pressure of 3  mmHg. Comparison(s): The left ventricular function is unchanged. FINDINGS  Left Ventricle: Left ventricular ejection fraction, by estimation, is 60 to 65%. The left ventricle has normal function. The left ventricle has no regional wall motion abnormalities. The left ventricular internal cavity size was normal in size. There is  mild left ventricular hypertrophy. Left ventricular diastolic parameters are indeterminate. Right Ventricle: The right ventricular size is normal. Right vetricular wall thickness was not assessed. Right ventricular systolic function is normal. There is normal pulmonary artery systolic pressure. The tricuspid regurgitant velocity is 1.76 m/s, and with an assumed right atrial pressure  of 3 mmHg, the estimated right ventricular systolic pressure is 80.3 mmHg. Left Atrium: Left atrial size was normal in size. Right Atrium: Right atrial size was normal in size. Pericardium: Trivial pericardial effusion is present. Mitral Valve: The mitral valve is normal in structure. Mild mitral valve regurgitation. Tricuspid Valve: The tricuspid valve is normal in structure. Tricuspid valve regurgitation is mild. Aortic Valve: The aortic valve is tricuspid. Aortic valve regurgitation is mild. Mild aortic valve sclerosis is present, with no evidence of aortic valve stenosis. Pulmonic Valve: The pulmonic valve was normal in structure. Pulmonic valve regurgitation is not visualized. Aorta: The aortic root is normal in size and structure. Venous: The inferior vena cava is normal in size with greater than 50% respiratory variability, suggesting right atrial pressure of 3 mmHg. IAS/Shunts: No atrial level shunt detected by color flow Doppler.  LEFT VENTRICLE PLAX 2D LVIDd:         4.30 cm  Diastology LVIDs:         2.90 cm  LV e' medial:    4.57 cm/s LV PW:         1.30 cm  LV E/e' medial:  11.6 LV IVS:        1.10 cm  LV e' lateral:   4.57 cm/s LVOT diam:     2.10 cm  LV E/e' lateral: 11.6 LV SV:         68 LV SV Index:   42 LVOT Area:     3.46 cm  RIGHT VENTRICLE RV S prime:     10.60 cm/s TAPSE (M-mode): 1.6 cm LEFT ATRIUM             Index       RIGHT ATRIUM           Index LA diam:        3.70 cm 2.30 cm/m  RA Area:     19.10 cm LA Vol (A2C):   60.0 ml 37.35 ml/m RA Volume:   58.50 ml  36.41 ml/m LA Vol (A4C):   44.2 ml 27.51 ml/m LA Biplane Vol: 52.9 ml 32.93 ml/m  AORTIC VALVE LVOT Vmax:   80.10 cm/s LVOT Vmean:  60.700 cm/s LVOT VTI:    0.195 m  AORTA Ao Root diam: 3.40 cm MITRAL VALVE               TRICUSPID VALVE MV Area (PHT): 3.10 cm    TR Peak grad:   12.4 mmHg MV Decel Time: 245 msec    TR Vmax:        176.00 cm/s MR Peak grad: 100.4 mmHg MR Mean grad: 61.0 mmHg    SHUNTS MR Vmax:      501.00 cm/s   Systemic VTI:  0.20 m MR Vmean:     355.0 cm/s   Systemic Diam: 2.10 cm MV E velocity: 53.00 cm/s MV A velocity: 87.90  cm/s MV E/A ratio:  0.60 Dorris Carnes MD Electronically signed by Dorris Carnes MD Signature Date/Time: 12/12/2020/4:56:53 PM    Final      ASSESSMENT:  1. IgG lambda multiple myeloma: -She is on Revlimid 10 mg 2 weeks on/2 weeks of along with Velcade 3 weeks on/1 week off. Dexamethasone 10 mg on days of Velcade. -Myeloma panel on 04/17/2020 shows M spike 0.5 g. Kappa light chains are 44. Lambda light chains are 34. Ratio is 1.3. -Resident of [facility] since 07/19/2020.  2. 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   PLAN:  1. IgG lambda multiple myeloma: -She is taking Revlimid 10 mg 2 weeks on/2 weeks off.  Velcade is 3 weeks on 1 week off. -Reviewed myeloma labs from 01/01/2021.  SPEP is 0.4 g, down from 0.5 g.  Light chain ratio is normal with kappa light chains improved to 21.3.  Lambda light chains are normal. -Because of her diarrhea, I recommended holding Revlimid at this time and see if it improves.  She will continue Velcade.  She was evaluated by GI and was started on Questran. -Plan to reevaluate in 4 weeks.  2. CKD: -Creatinine is 1.27 and stable.  3. Weight loss: -She lost about 2 to 3 pounds since last visit 12 weeks ago.  4. Dementia: -Continue Aricept 10 mg daily.  5. Shingles prophylaxis: -Continue acyclovir 400 mg twice daily.  6. Myeloma bone disease: -She was treated with Zometa for several years which was discontinued.  7. Macrocytic anemia: -This is from myelosuppression and CKD.  Hemoglobin is 10.6.  8.Diarrhea: -She is having lots of diarrhea.  Patient not able to quantitate due to dementia. -She was evaluated by GI and work-up was negative.  She was started on Questran. -We have called and talked to the nurse at the nursing home.  She is having 2 episodes of diarrhea during the night  shift. -I have recommended discontinuing Revlimid.  We will reevaluate her in 4 weeks.   Orders placed this encounter:  No orders of the defined types were placed in this encounter.    Derek Jack, MD [place] [phone]   I, Milinda Antis, am acting as a scribe for Dr. Sanda Linger.  I, Derek Jack MD, have reviewed the above documentation for accuracy and completeness, and I agree with the above.

## 2021-01-08 NOTE — Progress Notes (Signed)
Patient assessed and labs reviewed by Dr. Delton Coombes. Discontinuing Revlimid for now due to diarrhea. Okay to proceed with treatment. Primary RN and pharmacy aware.

## 2021-01-08 NOTE — Progress Notes (Signed)
Patient tolerated Velcade injection with no complaints voiced.  Lab work reviewed.  See MAR for details.  Injection site clean and dry with no bruising or swelling noted.  Band aid applied.  VSS.  Patient left in satisfactory condition with no s/s of distress noted.  

## 2021-01-09 ENCOUNTER — Telehealth: Payer: Self-pay

## 2021-01-09 ENCOUNTER — Non-Acute Institutional Stay: Payer: Self-pay

## 2021-01-09 DIAGNOSIS — Z1159 Encounter for screening for other viral diseases: Secondary | ICD-10-CM | POA: Diagnosis not present

## 2021-01-09 DIAGNOSIS — C8590 Non-Hodgkin lymphoma, unspecified, unspecified site: Secondary | ICD-10-CM | POA: Diagnosis not present

## 2021-01-09 DIAGNOSIS — R197 Diarrhea, unspecified: Secondary | ICD-10-CM

## 2021-01-09 NOTE — Telephone Encounter (Signed)
[place] called from the [facility] wanting to advise you that the pt has had a BM 7 times already today. Wants to know is this normal?, do the Creon need to be adjusted?, . She states she leaves at 3 pm today. Please call her. The number is [phone]

## 2021-01-09 NOTE — Telephone Encounter (Signed)
Phoned back to the [facility] gave instructions that Creon is already weight-based and instructions that medication would not make diarrhea worse. Also instructed that its probably best to have scheduled Imodium, taking 2 mg TID and to continue her Creon for now. They agreed to do so and Amanda Wu had actually thought to give her some Imodium earlier but wanted to wait on your instructions.

## 2021-01-09 NOTE — Progress Notes (Signed)
Opened in error

## 2021-01-09 NOTE — Telephone Encounter (Signed)
Creon is already at appropriate weight-based dosing and would not cause worsening diarrhea.   Probably best to have scheduled Imodium, taking 2 mg TID. Continue Creon for now.

## 2021-01-10 ENCOUNTER — Other Ambulatory Visit (HOSPITAL_COMMUNITY): Payer: Self-pay

## 2021-01-10 DIAGNOSIS — C9000 Multiple myeloma not having achieved remission: Secondary | ICD-10-CM

## 2021-01-10 NOTE — Telephone Encounter (Signed)
Chart reviewed. Revlimid has not been refilled due to Dr. Tomie China latest recommendation to hold the medication and re-evaluate in 4 weeks.

## 2021-01-11 DIAGNOSIS — C8590 Non-Hodgkin lymphoma, unspecified, unspecified site: Secondary | ICD-10-CM | POA: Diagnosis not present

## 2021-01-11 DIAGNOSIS — Z1159 Encounter for screening for other viral diseases: Secondary | ICD-10-CM | POA: Diagnosis not present

## 2021-01-16 DIAGNOSIS — C8590 Non-Hodgkin lymphoma, unspecified, unspecified site: Secondary | ICD-10-CM | POA: Diagnosis not present

## 2021-01-16 DIAGNOSIS — Z1159 Encounter for screening for other viral diseases: Secondary | ICD-10-CM | POA: Diagnosis not present

## 2021-01-18 DIAGNOSIS — C8590 Non-Hodgkin lymphoma, unspecified, unspecified site: Secondary | ICD-10-CM | POA: Diagnosis not present

## 2021-01-18 DIAGNOSIS — Z1159 Encounter for screening for other viral diseases: Secondary | ICD-10-CM | POA: Diagnosis not present

## 2021-01-19 ENCOUNTER — Encounter (HOSPITAL_COMMUNITY)
Admission: RE | Admit: 2021-01-19 | Discharge: 2021-01-19 | Disposition: A | Discharge status: Home / Self Care | Payer: Medicare PPO | Source: Skilled Nursing Facility | Attending: Gastroenterology | Admitting: Gastroenterology

## 2021-01-19 DIAGNOSIS — D509 Iron deficiency anemia, unspecified: Secondary | ICD-10-CM | POA: Insufficient documentation

## 2021-01-19 LAB — CBC WITH DIFFERENTIAL/PLATELET
Abs Immature Granulocytes: 0.03 10*3/uL (ref 0.00–0.07)
Basophils Absolute: 0 10*3/uL (ref 0.0–0.1)
Basophils Relative: 0 %
Eosinophils Absolute: 0.1 10*3/uL (ref 0.0–0.5)
Eosinophils Relative: 2 %
HCT: 33.8 % — ABNORMAL LOW (ref 36.0–46.0)
Hemoglobin: 11.1 g/dL — ABNORMAL LOW (ref 12.0–15.0)
Immature Granulocytes: 0 %
Lymphocytes Relative: 6 %
Lymphs Abs: 0.4 10*3/uL — ABNORMAL LOW (ref 0.7–4.0)
MCH: 33.5 pg (ref 26.0–34.0)
MCHC: 32.8 g/dL (ref 30.0–36.0)
MCV: 102.1 fL — ABNORMAL HIGH (ref 80.0–100.0)
Monocytes Absolute: 0.6 10*3/uL (ref 0.1–1.0)
Monocytes Relative: 7 %
Neutro Abs: 6.4 10*3/uL (ref 1.7–7.7)
Neutrophils Relative %: 85 %
Platelets: 146 10*3/uL — ABNORMAL LOW (ref 150–400)
RBC: 3.31 MIL/uL — ABNORMAL LOW (ref 3.87–5.11)
RDW: 15.9 % — ABNORMAL HIGH (ref 11.5–15.5)
WBC: 7.6 10*3/uL (ref 4.0–10.5)
nRBC: 0 % (ref 0.0–0.2)

## 2021-01-20 ENCOUNTER — Other Ambulatory Visit (HOSPITAL_COMMUNITY): Payer: Self-pay | Admitting: Hematology

## 2021-01-20 DIAGNOSIS — C9000 Multiple myeloma not having achieved remission: Secondary | ICD-10-CM

## 2021-01-21 DIAGNOSIS — C8590 Non-Hodgkin lymphoma, unspecified, unspecified site: Secondary | ICD-10-CM | POA: Diagnosis not present

## 2021-01-21 DIAGNOSIS — Z1159 Encounter for screening for other viral diseases: Secondary | ICD-10-CM | POA: Diagnosis not present

## 2021-01-22 ENCOUNTER — Encounter (HOSPITAL_COMMUNITY): Payer: Self-pay

## 2021-01-22 ENCOUNTER — Inpatient Hospital Stay (HOSPITAL_COMMUNITY): Payer: Medicare PPO | Attending: Hematology

## 2021-01-22 ENCOUNTER — Other Ambulatory Visit: Payer: Self-pay

## 2021-01-22 ENCOUNTER — Inpatient Hospital Stay (HOSPITAL_COMMUNITY): Payer: Medicare PPO

## 2021-01-22 VITALS — BP 121/50 | HR 78 | Temp 96.8°F | Resp 16 | Wt 114.2 lb

## 2021-01-22 DIAGNOSIS — E1122 Type 2 diabetes mellitus with diabetic chronic kidney disease: Secondary | ICD-10-CM | POA: Diagnosis not present

## 2021-01-22 DIAGNOSIS — C9000 Multiple myeloma not having achieved remission: Secondary | ICD-10-CM

## 2021-01-22 DIAGNOSIS — Z79899 Other long term (current) drug therapy: Secondary | ICD-10-CM | POA: Diagnosis not present

## 2021-01-22 DIAGNOSIS — G9389 Other specified disorders of brain: Secondary | ICD-10-CM | POA: Insufficient documentation

## 2021-01-22 DIAGNOSIS — E1136 Type 2 diabetes mellitus with diabetic cataract: Secondary | ICD-10-CM | POA: Insufficient documentation

## 2021-01-22 DIAGNOSIS — R634 Abnormal weight loss: Secondary | ICD-10-CM | POA: Diagnosis not present

## 2021-01-22 DIAGNOSIS — N189 Chronic kidney disease, unspecified: Secondary | ICD-10-CM | POA: Diagnosis not present

## 2021-01-22 DIAGNOSIS — Z5111 Encounter for antineoplastic chemotherapy: Secondary | ICD-10-CM | POA: Diagnosis not present

## 2021-01-22 DIAGNOSIS — I129 Hypertensive chronic kidney disease with stage 1 through stage 4 chronic kidney disease, or unspecified chronic kidney disease: Secondary | ICD-10-CM | POA: Insufficient documentation

## 2021-01-22 DIAGNOSIS — Z8572 Personal history of non-Hodgkin lymphomas: Secondary | ICD-10-CM | POA: Diagnosis not present

## 2021-01-22 DIAGNOSIS — F0390 Unspecified dementia without behavioral disturbance: Secondary | ICD-10-CM | POA: Insufficient documentation

## 2021-01-22 DIAGNOSIS — Z7982 Long term (current) use of aspirin: Secondary | ICD-10-CM | POA: Diagnosis not present

## 2021-01-22 DIAGNOSIS — R400 Somnolence: Secondary | ICD-10-CM | POA: Insufficient documentation

## 2021-01-22 DIAGNOSIS — I6782 Cerebral ischemia: Secondary | ICD-10-CM | POA: Diagnosis not present

## 2021-01-22 DIAGNOSIS — E039 Hypothyroidism, unspecified: Secondary | ICD-10-CM | POA: Diagnosis not present

## 2021-01-22 LAB — COMPREHENSIVE METABOLIC PANEL
ALT: 19 U/L (ref 0–44)
AST: 28 U/L (ref 15–41)
Albumin: 3 g/dL — ABNORMAL LOW (ref 3.5–5.0)
Alkaline Phosphatase: 57 U/L (ref 38–126)
Anion gap: 8 (ref 5–15)
BUN: 26 mg/dL — ABNORMAL HIGH (ref 8–23)
CO2: 26 mmol/L (ref 22–32)
Calcium: 8.9 mg/dL (ref 8.9–10.3)
Chloride: 105 mmol/L (ref 98–111)
Creatinine, Ser: 1.34 mg/dL — ABNORMAL HIGH (ref 0.44–1.00)
GFR, Estimated: 41 mL/min — ABNORMAL LOW (ref >60)
Glucose, Bld: 110 mg/dL — ABNORMAL HIGH (ref 70–99)
Potassium: 3.9 mmol/L (ref 3.5–5.1)
Sodium: 139 mmol/L (ref 135–145)
Total Bilirubin: 0.8 mg/dL (ref 0.3–1.2)
Total Protein: 6 g/dL — ABNORMAL LOW (ref 6.5–8.1)

## 2021-01-22 LAB — CBC WITH DIFFERENTIAL/PLATELET
Abs Immature Granulocytes: 0.01 10*3/uL (ref 0.00–0.07)
Basophils Absolute: 0 10*3/uL (ref 0.0–0.1)
Basophils Relative: 1 %
Eosinophils Absolute: 0.2 10*3/uL (ref 0.0–0.5)
Eosinophils Relative: 4 %
HCT: 31.4 % — ABNORMAL LOW (ref 36.0–46.0)
Hemoglobin: 10.1 g/dL — ABNORMAL LOW (ref 12.0–15.0)
Immature Granulocytes: 0 %
Lymphocytes Relative: 11 %
Lymphs Abs: 0.5 10*3/uL — ABNORMAL LOW (ref 0.7–4.0)
MCH: 33.3 pg (ref 26.0–34.0)
MCHC: 32.2 g/dL (ref 30.0–36.0)
MCV: 103.6 fL — ABNORMAL HIGH (ref 80.0–100.0)
Monocytes Absolute: 0.7 10*3/uL (ref 0.1–1.0)
Monocytes Relative: 14 %
Neutro Abs: 3.3 10*3/uL (ref 1.7–7.7)
Neutrophils Relative %: 70 %
Platelets: 134 10*3/uL — ABNORMAL LOW (ref 150–400)
RBC: 3.03 MIL/uL — ABNORMAL LOW (ref 3.87–5.11)
RDW: 15.7 % — ABNORMAL HIGH (ref 11.5–15.5)
WBC: 4.8 10*3/uL (ref 4.0–10.5)
nRBC: 0 % (ref 0.0–0.2)

## 2021-01-22 LAB — LACTATE DEHYDROGENASE: LDH: 180 U/L (ref 98–192)

## 2021-01-22 MED ORDER — BORTEZOMIB CHEMO SQ INJECTION 3.5 MG (2.5MG/ML)
1.3000 mg/m2 | Freq: Once | INTRAMUSCULAR | Status: AC
  Administered 2021-01-22: 2 mg via SUBCUTANEOUS
  Filled 2021-01-22: qty 0.8

## 2021-01-22 MED ORDER — DEXAMETHASONE 4 MG PO TABS
10.0000 mg | ORAL_TABLET | Freq: Once | ORAL | Status: AC
  Administered 2021-01-22: 10 mg via ORAL
  Filled 2021-01-22: qty 3

## 2021-01-22 NOTE — Progress Notes (Signed)
Pt here for velcade today.  No complaints or pain today.  Labs and vital signs stable for velcade today.  Tolerated velcade injection without incidence today.  Discharged via wheelchair in stable condition.

## 2021-01-22 NOTE — Patient Instructions (Signed)
[facility] Discharge Instructions for Patients Receiving Chemotherapy  Today you received the following chemotherapy agents   To help prevent nausea and vomiting after your treatment, we encourage you to take your nausea medication   If you develop nausea and vomiting that is not controlled by your nausea medication, call the clinic.   BELOW ARE SYMPTOMS THAT SHOULD BE REPORTED IMMEDIATELY:  *FEVER GREATER THAN 100.5 F  *CHILLS WITH OR WITHOUT FEVER  NAUSEA AND VOMITING THAT IS NOT CONTROLLED WITH YOUR NAUSEA MEDICATION  *UNUSUAL SHORTNESS OF BREATH  *UNUSUAL BRUISING OR BLEEDING  TENDERNESS IN MOUTH AND THROAT WITH OR WITHOUT PRESENCE OF ULCERS  *URINARY PROBLEMS  *BOWEL PROBLEMS  UNUSUAL RASH Items with * indicate a potential emergency and should be followed up as soon as possible.  Feel free to call the clinic should you have any questions or concerns. The clinic phone number is [phone].  Please show the CHEMO ALERT CARD at check-in to the Emergency Department and triage nurse.   

## 2021-01-23 ENCOUNTER — Other Ambulatory Visit (HOSPITAL_COMMUNITY): Payer: Self-pay

## 2021-01-23 DIAGNOSIS — Z1159 Encounter for screening for other viral diseases: Secondary | ICD-10-CM | POA: Diagnosis not present

## 2021-01-23 DIAGNOSIS — C8590 Non-Hodgkin lymphoma, unspecified, unspecified site: Secondary | ICD-10-CM | POA: Diagnosis not present

## 2021-01-23 MED ORDER — LANREOTIDE ACETATE 120 MG/0.5ML ~~LOC~~ SOLN
SUBCUTANEOUS | Status: AC
  Filled 2021-01-23: qty 120

## 2021-01-23 NOTE — Telephone Encounter (Signed)
Revlimid held per Dr. Delton Coombes

## 2021-01-25 DIAGNOSIS — C8590 Non-Hodgkin lymphoma, unspecified, unspecified site: Secondary | ICD-10-CM | POA: Diagnosis not present

## 2021-01-25 DIAGNOSIS — Z1159 Encounter for screening for other viral diseases: Secondary | ICD-10-CM | POA: Diagnosis not present

## 2021-01-28 DIAGNOSIS — Z1159 Encounter for screening for other viral diseases: Secondary | ICD-10-CM | POA: Diagnosis not present

## 2021-01-28 DIAGNOSIS — C8590 Non-Hodgkin lymphoma, unspecified, unspecified site: Secondary | ICD-10-CM | POA: Diagnosis not present

## 2021-01-28 DIAGNOSIS — L602 Onychogryphosis: Secondary | ICD-10-CM | POA: Diagnosis not present

## 2021-01-28 DIAGNOSIS — L603 Nail dystrophy: Secondary | ICD-10-CM | POA: Diagnosis not present

## 2021-01-28 DIAGNOSIS — E1151 Type 2 diabetes mellitus with diabetic peripheral angiopathy without gangrene: Secondary | ICD-10-CM | POA: Diagnosis not present

## 2021-01-29 ENCOUNTER — Other Ambulatory Visit: Payer: Self-pay

## 2021-01-29 ENCOUNTER — Inpatient Hospital Stay (HOSPITAL_COMMUNITY): Payer: Medicare PPO

## 2021-01-29 VITALS — BP 129/73 | HR 75 | Temp 96.8°F | Resp 17 | Wt 111.9 lb

## 2021-01-29 DIAGNOSIS — C9000 Multiple myeloma not having achieved remission: Secondary | ICD-10-CM

## 2021-01-29 DIAGNOSIS — F0390 Unspecified dementia without behavioral disturbance: Secondary | ICD-10-CM | POA: Diagnosis not present

## 2021-01-29 DIAGNOSIS — E039 Hypothyroidism, unspecified: Secondary | ICD-10-CM | POA: Diagnosis not present

## 2021-01-29 DIAGNOSIS — N189 Chronic kidney disease, unspecified: Secondary | ICD-10-CM | POA: Diagnosis not present

## 2021-01-29 DIAGNOSIS — E1136 Type 2 diabetes mellitus with diabetic cataract: Secondary | ICD-10-CM | POA: Diagnosis not present

## 2021-01-29 DIAGNOSIS — Z5111 Encounter for antineoplastic chemotherapy: Secondary | ICD-10-CM | POA: Diagnosis not present

## 2021-01-29 DIAGNOSIS — E1122 Type 2 diabetes mellitus with diabetic chronic kidney disease: Secondary | ICD-10-CM | POA: Diagnosis not present

## 2021-01-29 DIAGNOSIS — R400 Somnolence: Secondary | ICD-10-CM | POA: Diagnosis not present

## 2021-01-29 DIAGNOSIS — R634 Abnormal weight loss: Secondary | ICD-10-CM | POA: Diagnosis not present

## 2021-01-29 LAB — CBC WITH DIFFERENTIAL/PLATELET
Abs Immature Granulocytes: 0 10*3/uL (ref 0.00–0.07)
Basophils Absolute: 0 10*3/uL (ref 0.0–0.1)
Basophils Relative: 1 %
Eosinophils Absolute: 0.1 10*3/uL (ref 0.0–0.5)
Eosinophils Relative: 2 %
HCT: 33.6 % — ABNORMAL LOW (ref 36.0–46.0)
Hemoglobin: 10.8 g/dL — ABNORMAL LOW (ref 12.0–15.0)
Immature Granulocytes: 0 %
Lymphocytes Relative: 12 %
Lymphs Abs: 0.4 10*3/uL — ABNORMAL LOW (ref 0.7–4.0)
MCH: 33.1 pg (ref 26.0–34.0)
MCHC: 32.1 g/dL (ref 30.0–36.0)
MCV: 103.1 fL — ABNORMAL HIGH (ref 80.0–100.0)
Monocytes Absolute: 0.3 10*3/uL (ref 0.1–1.0)
Monocytes Relative: 10 %
Neutro Abs: 2.5 10*3/uL (ref 1.7–7.7)
Neutrophils Relative %: 75 %
Platelets: 97 10*3/uL — ABNORMAL LOW (ref 150–400)
RBC: 3.26 MIL/uL — ABNORMAL LOW (ref 3.87–5.11)
RDW: 15.5 % (ref 11.5–15.5)
WBC: 3.3 10*3/uL — ABNORMAL LOW (ref 4.0–10.5)
nRBC: 0 % (ref 0.0–0.2)

## 2021-01-29 LAB — COMPREHENSIVE METABOLIC PANEL
ALT: 21 U/L (ref 0–44)
AST: 29 U/L (ref 15–41)
Albumin: 3.2 g/dL — ABNORMAL LOW (ref 3.5–5.0)
Alkaline Phosphatase: 57 U/L (ref 38–126)
Anion gap: 6 (ref 5–15)
BUN: 19 mg/dL (ref 8–23)
CO2: 29 mmol/L (ref 22–32)
Calcium: 9.3 mg/dL (ref 8.9–10.3)
Chloride: 105 mmol/L (ref 98–111)
Creatinine, Ser: 1.23 mg/dL — ABNORMAL HIGH (ref 0.44–1.00)
GFR, Estimated: 46 mL/min — ABNORMAL LOW (ref >60)
Glucose, Bld: 158 mg/dL — ABNORMAL HIGH (ref 70–99)
Potassium: 3.6 mmol/L (ref 3.5–5.1)
Sodium: 140 mmol/L (ref 135–145)
Total Bilirubin: 0.6 mg/dL (ref 0.3–1.2)
Total Protein: 6 g/dL — ABNORMAL LOW (ref 6.5–8.1)

## 2021-01-29 MED ORDER — DEXAMETHASONE 4 MG PO TABS
ORAL_TABLET | ORAL | Status: AC
  Filled 2021-01-29: qty 1

## 2021-01-29 MED ORDER — DEXAMETHASONE 4 MG PO TABS
10.0000 mg | ORAL_TABLET | Freq: Once | ORAL | Status: AC
  Administered 2021-01-29: 10 mg via ORAL
  Filled 2021-01-29: qty 3

## 2021-01-29 MED ORDER — BORTEZOMIB CHEMO SQ INJECTION 3.5 MG (2.5MG/ML)
1.3000 mg/m2 | Freq: Once | INTRAMUSCULAR | Status: AC
  Administered 2021-01-29: 2 mg via SUBCUTANEOUS
  Filled 2021-01-29: qty 0.8

## 2021-01-29 NOTE — Progress Notes (Signed)
Patient stated the diarrhea is better since last visit and confirmed by sister in the room.  No complaints voiced and no s/s of distress noted.    Patient tolerated Velcade injection with no complaints voiced.  Lab work reviewed.  See MAR for details.  Injection site clean and dry with no bruising or swelling noted.  Band aid applied.  VSS.  Patient left in satisfactory condition with no s/s of distress noted.

## 2021-01-30 DIAGNOSIS — Z1159 Encounter for screening for other viral diseases: Secondary | ICD-10-CM | POA: Diagnosis not present

## 2021-01-30 DIAGNOSIS — C8590 Non-Hodgkin lymphoma, unspecified, unspecified site: Secondary | ICD-10-CM | POA: Diagnosis not present

## 2021-01-30 MED ORDER — ACETAMINOPHEN 325 MG PO TABS
ORAL_TABLET | ORAL | Status: AC
  Filled 2021-01-30: qty 2

## 2021-01-30 MED ORDER — LORATADINE 10 MG PO TABS
ORAL_TABLET | ORAL | Status: AC
  Filled 2021-01-30: qty 1

## 2021-01-30 MED ORDER — FAMOTIDINE 20 MG PO TABS
ORAL_TABLET | ORAL | Status: AC
  Filled 2021-01-30: qty 1

## 2021-01-31 ENCOUNTER — Non-Acute Institutional Stay (SKILLED_NURSING_FACILITY): Payer: Medicare PPO | Admitting: Adult Health

## 2021-01-31 ENCOUNTER — Encounter: Payer: Self-pay | Admitting: Adult Health

## 2021-01-31 DIAGNOSIS — F0151 Vascular dementia with behavioral disturbance: Secondary | ICD-10-CM | POA: Diagnosis not present

## 2021-01-31 DIAGNOSIS — I700 Atherosclerosis of aorta: Secondary | ICD-10-CM

## 2021-01-31 DIAGNOSIS — F01518 Vascular dementia, unspecified severity, with other behavioral disturbance: Secondary | ICD-10-CM

## 2021-01-31 DIAGNOSIS — K8689 Other specified diseases of pancreas: Secondary | ICD-10-CM

## 2021-01-31 NOTE — Progress Notes (Signed)
Location:   [facility] Room Number: 148/W Place of Service:  SNF (31)   CODE STATUS: Full Code  Allergies  Allergen Reactions  . Lotensin [Benazepril]     Chief Complaint  Patient presents with  . Acute Visit    Care Plan Meeting     HPI:  We have come together for her care plan meeting. BIMS 5/30 mood 0/30. Family present. She requires supervision to extensive assist with her adls. She is able to feed herself. Is frequently incontinent of bowel and bladder. There have been no falls. Her diarrhea has resolved with imodium; creon and questran. Her weight is 115.4 pounds with an increase of 4 pounds with 50-100% intake at meal times. She is being seen by ST for cognition.has wander guard. Attends less activities than her sister.  She continues to be followed for her chronic illnesses including: Aortic atherosclerosis  Vascular dementia with behavioral disturbance  Pancreatic insuffiencey    Past Medical History:  Diagnosis Date  . Benign hypertension   . Cataract   . 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 Diabetes mellitus without complication ([place])   . H/O partial nephrectomy   . Hypokalemia   . Hypothyroidism   . Impaired cognition   . Multiple myeloma (H[facility])    Dr Maylon Peppers, [facility]  . Thyroid disease     Past Surgical History:  Procedure Laterality Date  . ABDOMINAL HYSTERECTOMY    . CRANIOTOMY     for lymphoma  . YAG LASER APPLICATION Right 1/44/8185   Procedure: YAG LASER APPLICATION;  Surgeon: [name], MD;  Location: [facility] OR[facility];  Service: Ophthalmology;  Laterality: Right;    Social History   Socioeconomic History  . Marital status: Single    Spouse name: Not on file  . Number of children: Not on file  . Years of education: Not on file  . Highest education level: Not on file  Occupational History  . Occupation: retired   Tobacco Use  . Smoking status: Never Smoker  . Smokeless tobacco: Never Used  Vaping Use  . Vaping Use:  Never used  Substance and Sexual Activity  . Alcohol use: No  . Drug use: No  . Sexual activity: Never  Other Topics Concern  . Not on file  Social History Narrative   Long term resident of [facility]    Social Determinants of Health   Financial Resource Strain: Low Risk   . Difficulty of Paying Living Expenses: Not very hard  Food Insecurity: No Food Insecurity  . Worried About Charity fundraiser in the Last Year: Never true  . Ran Out of Food in the Last Year: Never true  Transportation Needs: No Transportation Needs  . Lack of Transportation (Medical): No  . Lack of Transportation (Non-Medical): No  Physical Activity: Inactive  . Days of Exercise per Week: 0 days  . Minutes of Exercise per Session: 0 min  Stress: No Stress Concern Present  . Feeling of Stress : Not at all  Social Connections: Moderately Isolated  . Frequency of Communication with Friends and Family: More than three times a week  . Frequency of Social Gatherings with Friends and Family: Once a week  . Attends Religious Services: More than 4 times per year  . Active Member of Clubs or Organizations: No  . Attends Archivist Meetings: Never  . Marital Status: Never married  Intimate Partner Violence: Not on file   Family History  Problem Relation  Age of Onset  . Depression Mother   . Diabetes Mother   . Heart disease Father   . Cancer - Prostate Brother   . Cancer Brother   . Cancer Sister       VITAL SIGNS BP (!) 105/58   Pulse 81   Temp 97.8 F (36.6 C)   Ht _0  (1.753 m)   Wt 115 lb 6.4 oz (52.3 kg)   BMI 17.04 kg/m   Facility-Administered Encounter Medications as of 01/31/2021  Medication  . prochlorperazine (COMPAZINE) tablet 10 mg   Outpatient Encounter Medications as of 01/31/2021  Medication Sig  . acetaminophen (TYLENOL) 500 MG tablet Take 1,000 mg by mouth every 6 (six) hours as needed for mild pain.  Marland Kitchen acyclovir (ZOVIRAX) 400 MG tablet TAKE 1 TABLET BY MOUTH TWICE DAILY   . aspirin EC 81 MG tablet Take 81 mg by mouth daily.  . Calcium Carbonate-Vitamin D (CALCIUM-VITAMIN D3 PO) Take 400 Units by mouth daily in the afternoon.  . cholestyramine (QUESTRAN) 4 g packet Take 4 g by mouth 2 (two) times daily between meals.  . Lactobacillus Probiotic TABS Take 2 tablets by mouth daily.  Marland Kitchen levothyroxine (SYNTHROID, LEVOTHROID) 25 MCG tablet Take 25 mcg by mouth daily before breakfast.  . lipase/protease/amylase (CREON) 36000 UNITS CPEP capsule Take 72,000 Units by mouth 3 (three) times daily with meals.  Marland Kitchen loperamide (IMODIUM A-D) 2 MG tablet Take 4 mg by mouth 2 (two) times daily as needed for diarrhea or loose stools.  Marland Kitchen losartan (COZAAR) 50 MG tablet Take 1 tablet (50 mg total) by mouth daily.  . Multiple Vitamins-Minerals (MULTIVITAMIN WOMEN 50+ PO) Take by mouth daily.   . NON FORMULARY Regular diet no dairy products.  . NON FORMULARY Wanderguard [phone] to ankle for safety awareness. Check placement and function qshift. Every Shift Day, Evening, Night  . potassium chloride SA (KLOR-CON) 20 MEQ tablet Take 20 mEq by mouth daily.   . [DISCONTINUED] lenalidomide (REVLIMID) 10 MG capsule TAKE 1 CAPSULE BY MOUTH  DAILY FOR 14 DAYS, THEN 14  DAYS OFF (Patient not taking: Reported on 01/29/2021)     SIGNIFICANT DIAGNOSTIC EXAMS   PREVIOUS   08-10-20: t score: -3.758  12-05-20: EKG: sinus bradycardia with first degree AV block pvc pac   12-21-20: KUBThere is a mild ileus noted. Mild to moderate increase feces in the colon. No renal stone is seen  12-25-20: KUB  There is a nonspecific bowel gas pattern without obstruction. No residual ileus is noted. Mild increased feces in the colon. No renal stone is seen.  NO NEW EXAMS    LABS REVIEWED PREVIOUS    07-20-20: vit B 12: 620  07-23-20: tsh 2.264 07-24-20: wbc 3.9; hgb 9.3; hct 29.3 mcv 105.4 plt 105; glucose 106; bun 25; creat 1.28; k+ 3.6; na++ 142; a 9.2 liver normal albumin 3.5  07-30-20: chol 116; ldl 49; trig 71;  hdl 53; hgb a1c 5.1; hep C neg  08-07-20: wbc 3.5; hgb 9.0; hct 27.9; mcv 104.5 plt 112; glucose 86; bun 27; creat 1.22; k+ 4.2; an++ 143; ca 9.0 liver normal albumin 3.2 08-14-20: wbc 2.8; hgb 9.4; hct 29.2; mcv 104.3 plt 101; glucose 71; bun 27; creat 1.41; k+ 4.3; na++ 140; ca 9.0 liver normal albumin 3.1  08-16-20: urine micro-albumin 9.3 08-21-20: wbc 3.9; hgb 9.5; hct 29.7 mcv 104.2 plt 86; glucose 86; bun 18; creat 1.09; k+ 3.7; na++ 144; ca 8.1; liver normal albumin 3.1  09-18-20: wbc 4.4;  hgb 9.6; hct 29.7 mcv 102.4 plt 105; glucose 99; bun 20; creat 1.12; k+ 3.2; na++ 142; ca 8.8 liver normal albumin 3.2  10-16-20: wbc 3.9; hgb 10.2; hct 32.3; mcv 104.9 plt 95; glucose 98; bun 19; creat 1.25; k+ 3.9; na++ 139; ca 8.8 liver normal albumin 3.2  12-04-20: wbc 3.1; hgb 10.9; hct 34.6; mcv 103.0 plt 130; glucose 132; bun 27; creat 1.35; k+ 3.6; na++ 143; ca 9.3 liver normal albumin 3.5  12-25-20: wbc 2.7; hgb 10.1; hct 31.2 mcv 103.0 plt 120; glucose 108; bun 13; creat 1.53; k+ 3.9; na++ 138; ca 8.7 liver normal albumin 3.2 GFR 35 01-01-21: wbc 2.5; hgb 10.6; hct 32.1; mcv 100.6 plt 87; glucose 87; bun 27; creat 1.24; k+ 3.8; na++ 144; ca 9.3 GFR 45; liver normal albumin 3.4   NO NEW LABS.   Review of Systems  Constitutional: Negative for malaise/fatigue.  Respiratory: Negative for cough and shortness of breath.   Cardiovascular: Negative for chest pain, palpitations and leg swelling.  Gastrointestinal: Negative for abdominal pain, constipation and heartburn.  Musculoskeletal: Negative for back pain, joint pain and myalgias.  Skin: Negative.   Neurological: Negative for dizziness.  Psychiatric/Behavioral: The patient is not nervous/anxious.       Physical Exam Constitutional:      General: She is not in acute distress.    Appearance: She is well-developed and well-nourished. She is cachectic. She is not diaphoretic.  Neck:     Thyroid: No thyromegaly.  Cardiovascular:     Rate and Rhythm:  Regular rhythm. Bradycardia present.     Pulses: Intact distal pulses.     Heart sounds: Murmur heard.      Comments: 2/6 PP faint  Pulmonary:     Effort: Pulmonary effort is normal. No respiratory distress.     Breath sounds: Normal breath sounds.  Abdominal:     General: Bowel sounds are normal. There is no distension.     Palpations: Abdomen is soft.     Tenderness: There is no abdominal tenderness.  Musculoskeletal:        General: No edema. Normal range of motion.     Cervical back: Neck supple.     Right lower leg: No edema.     Left lower leg: No edema.  Lymphadenopathy:     Cervical: No cervical adenopathy.  Skin:    General: Skin is warm and dry.  Neurological:     Mental Status: She is alert. Mental status is at baseline.  Psychiatric:        Mood and Affect: Mood and affect and mood normal.      ASSESSMENT/ PLAN:  TODAY  1. Aortic atherosclerosis 2. Vascular dementia with behavioral disturbance 3. Pancreatic insuffiencey   Will continue current medications Will continue current plan of care Will continue to monitor her status.   MD is aware of resident's narcotic use and is in agreement with current plan of care. We will attempt to wean resident as appropriate.  Ok Edwards NP [facility]  Contact [phone] Monday through Friday 8am- 5pm  After hours call [phone]

## 2021-02-01 ENCOUNTER — Non-Acute Institutional Stay (SKILLED_NURSING_FACILITY): Payer: Medicare PPO | Admitting: Adult Health

## 2021-02-01 ENCOUNTER — Encounter: Payer: Self-pay | Admitting: Adult Health

## 2021-02-01 DIAGNOSIS — Z9181 History of falling: Secondary | ICD-10-CM | POA: Diagnosis not present

## 2021-02-01 DIAGNOSIS — R41841 Cognitive communication deficit: Secondary | ICD-10-CM | POA: Diagnosis not present

## 2021-02-01 DIAGNOSIS — D539 Nutritional anemia, unspecified: Secondary | ICD-10-CM

## 2021-02-01 DIAGNOSIS — F0151 Vascular dementia with behavioral disturbance: Secondary | ICD-10-CM

## 2021-02-01 DIAGNOSIS — F01518 Vascular dementia, unspecified severity, with other behavioral disturbance: Secondary | ICD-10-CM

## 2021-02-01 DIAGNOSIS — R55 Syncope and collapse: Secondary | ICD-10-CM | POA: Diagnosis not present

## 2021-02-01 DIAGNOSIS — B009 Herpesviral infection, unspecified: Secondary | ICD-10-CM | POA: Diagnosis not present

## 2021-02-01 DIAGNOSIS — I700 Atherosclerosis of aorta: Secondary | ICD-10-CM

## 2021-02-01 DIAGNOSIS — F0390 Unspecified dementia without behavioral disturbance: Secondary | ICD-10-CM | POA: Diagnosis not present

## 2021-02-01 NOTE — Progress Notes (Signed)
Location:  [facility] Room Number: 148-W Place of Service:  SNF (31)   CODE STATUS: Full Code  Allergies  Allergen Reactions  . Lotensin [Benazepril]     Chief Complaint  Patient presents with  . Medical Management of Chronic Issues          Macrocytic anemia  Vascular dementia with behavioral disturbance:Herpes:    HPI:  She is a 11 year lod long term resident of this facility being seen for the management of her chronic illnesses  Macrocytic anemia  Vascular dementia with behavioral disturbance:Herpes. There are no reports of uncontrolled pain. Her diarrhea has resolved; she has gained 4 pounds over the past month. No reports o of agitation or anxiety.   Past Medical History:  Diagnosis Date  . Benign hypertension   . Cataract   . 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 Diabetes mellitus without complication ([place])   . H/O partial nephrectomy   . Hypokalemia   . Hypothyroidism   . Impaired cognition   . Multiple myeloma (HCC)    Dr Maylon Peppers, [facility]  . Thyroid disease     Past Surgical History:  Procedure Laterality Date  . ABDOMINAL HYSTERECTOMY    . CRANIOTOMY     for lymphoma  . YAG LASER APPLICATION Right 6/37/8588   Procedure: YAG LASER APPLICATION;  Surgeon: [name], MD;  Location: [facility];  Service: Ophthalmology;  Laterality: Right;    Social History   Socioeconomic History  . Marital status: Single    Spouse name: Not on file  . Number of children: Not on file  . Years of education: Not on file  . Highest education level: Not on file  Occupational History  . Occupation: retired   Tobacco Use  . Smoking status: Never Smoker  . Smokeless tobacco: Never Used  Vaping Use  . Vaping Use: Never used  Substance and Sexual Activity  . Alcohol use: No  . Drug use: No  . Sexual activity: Never  Other Topics Concern  . Not on file  Social History Narrative   Long term resident of [facility]    Social Determinants of Health    Financial Resource Strain: Low Risk   . Difficulty of Paying Living Expenses: Not very hard  Food Insecurity: No Food Insecurity  . Worried About Charity fundraiser in the Last Year: Never true  . Ran Out of Food in the Last Year: Never true  Transportation Needs: No Transportation Needs  . Lack of Transportation (Medical): No  . Lack of Transportation (Non-Medical): No  Physical Activity: Inactive  . Days of Exercise per Week: 0 days  . Minutes of Exercise per Session: 0 min  Stress: No Stress Concern Present  . Feeling of Stress : Not at all  Social Connections: Moderately Isolated  . Frequency of Communication with Friends and Family: More than three times a week  . Frequency of Social Gatherings with Friends and Family: Once a week  . Attends Religious Services: More than 4 times per year  . Active Member of Clubs or Organizations: No  . Attends Archivist Meetings: Never  . Marital Status: Never married  Intimate Partner Violence: Not on file   Family History  Problem Relation Age of Onset  . Depression Mother   . Diabetes Mother   . Heart disease Father   . Cancer - Prostate Brother   . Cancer Brother   . Cancer Sister  VITAL SIGNS BP (!) 105/58   Pulse 81   Temp 97.8 F (36.6 C)   Resp 20   Ht '5\' 9"'  (1.753 m)   Wt 115 lb 6.4 oz (52.3 kg)   SpO2 97%   BMI 17.04 kg/m   Facility-Administered Encounter Medications as of 02/01/2021  Medication  . prochlorperazine (COMPAZINE) tablet 10 mg   Outpatient Encounter Medications as of 02/01/2021  Medication Sig  . acetaminophen (TYLENOL) 500 MG tablet Take 1,000 mg by mouth every 6 (six) hours as needed for mild pain.  Marland Kitchen acyclovir (ZOVIRAX) 400 MG tablet TAKE 1 TABLET BY MOUTH TWICE DAILY  . aspirin EC 81 MG tablet Take 81 mg by mouth daily.  . Calcium Carbonate-Vitamin D (CALCIUM-VITAMIN D3 PO) Take 400 Units by mouth daily in the afternoon.  . cholestyramine (QUESTRAN) 4 g packet Take 4 g by  mouth 2 (two) times daily between meals.  . Lactobacillus Probiotic TABS Take 2 tablets by mouth daily.  Marland Kitchen levothyroxine (SYNTHROID, LEVOTHROID) 25 MCG tablet Take 25 mcg by mouth daily before breakfast.  . lipase/protease/amylase (CREON) 36000 UNITS CPEP capsule Take 36,000 Units by mouth 2 (two) times daily. With snacks between meals at 10 am and 8 pm  . lipase/protease/amylase (CREON) 36000 UNITS CPEP capsule Take 72,000 Units by mouth 3 (three) times daily with meals. 8 am, 12 pm, and 6 pm  . loperamide (IMODIUM A-D) 2 MG tablet Take 4 mg by mouth 2 (two) times daily as needed for diarrhea or loose stools.  Marland Kitchen losartan (COZAAR) 50 MG tablet Take 1 tablet (50 mg total) by mouth daily.  . Multiple Vitamins-Minerals (MULTIVITAMIN WOMEN 50+ PO) Take by mouth daily.   . NON FORMULARY Regular diet no dairy products.  . NON FORMULARY Wanderguard [phone] to ankle for safety awareness. Check placement and function qshift. Every Shift Day, Evening, Night  . potassium chloride SA (KLOR-CON) 20 MEQ tablet Take 20 mEq by mouth daily.      SIGNIFICANT DIAGNOSTIC EXAMS  PREVIOUS   08-10-20: t score: -3.758  12-05-20: EKG: sinus bradycardia with first degree AV block pvc pac   12-21-20: KUBThere is a mild ileus noted. Mild to moderate increase feces in the colon. No renal stone is seen  12-25-20: KUB  There is a nonspecific bowel gas pattern without obstruction. No residual ileus is noted. Mild increased feces in the colon. No renal stone is seen.  NO NEW EXAMS    LABS REVIEWED PREVIOUS    07-20-20: vit B 12: 620  07-23-20: tsh 2.264 07-24-20: wbc 3.9; hgb 9.3; hct 29.3 mcv 105.4 plt 105; glucose 106; bun 25; creat 1.28; k+ 3.6; na++ 142; a 9.2 liver normal albumin 3.5  07-30-20: chol 116; ldl 49; trig 71; hdl 53; hgb a1c 5.1; hep C neg  08-07-20: wbc 3.5; hgb 9.0; hct 27.9; mcv 104.5 plt 112; glucose 86; bun 27; creat 1.22; k+ 4.2; an++ 143; ca 9.0 liver normal albumin 3.2 08-14-20: wbc 2.8; hgb 9.4; hct  29.2; mcv 104.3 plt 101; glucose 71; bun 27; creat 1.41; k+ 4.3; na++ 140; ca 9.0 liver normal albumin 3.1  08-16-20: urine micro-albumin 9.3 08-21-20: wbc 3.9; hgb 9.5; hct 29.7 mcv 104.2 plt 86; glucose 86; bun 18; creat 1.09; k+ 3.7; na++ 144; ca 8.1; liver normal albumin 3.1  09-18-20: wbc 4.4; hgb 9.6; hct 29.7 mcv 102.4 plt 105; glucose 99; bun 20; creat 1.12; k+ 3.2; na++ 142; ca 8.8 liver normal albumin 3.2  10-16-20: wbc 3.9; hgb  10.2; hct 32.3; mcv 104.9 plt 95; glucose 98; bun 19; creat 1.25; k+ 3.9; na++ 139; ca 8.8 liver normal albumin 3.2  12-04-20: wbc 3.1; hgb 10.9; hct 34.6; mcv 103.0 plt 130; glucose 132; bun 27; creat 1.35; k+ 3.6; na++ 143; ca 9.3 liver normal albumin 3.5  12-25-20: wbc 2.7; hgb 10.1; hct 31.2 mcv 103.0 plt 120; glucose 108; bun 13; creat 1.53; k+ 3.9; na++ 138; ca 8.7 liver normal albumin 3.2 GFR 3 01-01-21: wbc 2.5; hgb 10.6; hct 32.1; mcv 100.6 plt 87; glucose 87; bun 27; creat 1.24; k+ 3.8; na++ 144; ca 9.3 GFR 45; liver normal albumin 3.4   NO NEW LABS.   Review of Systems  Constitutional: Negative for malaise/fatigue.  Respiratory: Negative for cough and shortness of breath.   Cardiovascular: Negative for chest pain, palpitations and leg swelling.  Gastrointestinal: Negative for abdominal pain, constipation and heartburn.  Musculoskeletal: Negative for back pain, joint pain and myalgias.  Skin: Negative.   Neurological: Negative for dizziness.  Psychiatric/Behavioral: The patient is not nervous/anxious.    Physical Exam Constitutional:      General: She is not in acute distress.    Appearance: She is well-nourished. She is cachectic. She is not diaphoretic.  Neck:     Thyroid: No thyromegaly.  Cardiovascular:     Rate and Rhythm: Normal rate and regular rhythm.     Pulses: Intact distal pulses.     Heart sounds: Murmur heard.      Comments: 2/6 PP faint  Pulmonary:     Effort: Pulmonary effort is normal. No respiratory distress.     Breath sounds:  Normal breath sounds.  Abdominal:     General: Bowel sounds are normal. There is no distension.     Palpations: Abdomen is soft.     Tenderness: There is no abdominal tenderness.  Musculoskeletal:        General: No edema. Normal range of motion.     Cervical back: Neck supple.     Right lower leg: No edema.     Left lower leg: No edema.  Lymphadenopathy:     Cervical: No cervical adenopathy.  Skin:    General: Skin is warm and dry.  Neurological:     Mental Status: She is alert. Mental status is at baseline.  Psychiatric:        Mood and Affect: Mood and affect and mood normal.        ASSESSMENT/ PLAN:  TODAY   1. Macrocytic anemia is stable hgb 9.6 vit B 12: 620; is off iron will monitor   2. Vascular dementia with behavioral disturbance: is stable weight is 115 pounds her aricept was stopped due to side effects.   3. Herpes: no recent outbreaks; will continue acyclovir 400 mg twice daily    PREVIOUS   4. Failure to thrive in adult is without change: weight is 115 pounds will continue supplements as directed  5. Hypokalemia: is stable k+ 3.8 will continue k+ 20 meq daily   6. Hypothyroidism  Unspecified type: is stable tsh 2.264 will continue synthroid 25 mcg daily   7. Hypertension associated with type 2 diabetes mellitus: is stable b/p 104/84 will continue cozaar 50 mg daily and asa 81 mg daily   8. Diabetes mellitus without complication: is stable hgb a1c 5.1 will monitor is on arb and asa  9. Post menopausal osteoporosis: t score -3.758 will continue calcium and vit D   10. Compression fracture of L1 vertebrae with  routine healing subsequent encounter (06/2017) is stable has prn tylenol   11. Thrombocytopenia: is stable plt 105 will monitor   12. Multiple myeloma without achieving remission: is without change: is followed by oncology will ocnintue revlimid 100 mg on 14 days and off 14 days. Will monitor   13. Aortic atherosclerosis: will monitor   14.  First degree av block/bradycardia: is stable has been seen by cardiology will monitor   15. Pancreatic insuffiencey: is slowly improving: will continue questran 4 gm twice daily; will continue creon 72,000 units with meals and imodium 2 mg three times daily       MD is aware of resident's narcotic use and is in agreement with current plan of care. We will attempt to wean resident as appropriate.  Ok Edwards NP [facility]  Contact [phone] Monday through Friday 8am- 5pm  After hours call [phone]

## 2021-02-05 ENCOUNTER — Other Ambulatory Visit: Payer: Self-pay

## 2021-02-05 ENCOUNTER — Emergency Department (HOSPITAL_COMMUNITY): Payer: Medicare PPO

## 2021-02-05 ENCOUNTER — Emergency Department (HOSPITAL_COMMUNITY)
Admission: EM | Admit: 2021-02-05 | Discharge: 2021-02-05 | Disposition: A | Discharge status: Home / Self Care | Payer: Medicare PPO | Attending: Emergency Medicine | Admitting: Emergency Medicine

## 2021-02-05 ENCOUNTER — Encounter (HOSPITAL_COMMUNITY): Payer: Self-pay | Admitting: Emergency Medicine

## 2021-02-05 ENCOUNTER — Inpatient Hospital Stay (HOSPITAL_COMMUNITY): Payer: Medicare PPO

## 2021-02-05 ENCOUNTER — Inpatient Hospital Stay (HOSPITAL_BASED_OUTPATIENT_CLINIC_OR_DEPARTMENT_OTHER): Payer: Medicare PPO | Admitting: Hematology

## 2021-02-05 ENCOUNTER — Inpatient Hospital Stay
Admission: RE | Admit: 2021-02-05 | Discharge: 2021-07-17 | Disposition: A | Discharge status: Still In Hospital | Payer: Medicare PPO | Source: Ambulatory Visit | Attending: Internal Medicine | Admitting: Internal Medicine

## 2021-02-05 VITALS — BP 69/35 | HR 52 | Temp 96.6°F | Resp 16 | Wt 110.7 lb

## 2021-02-05 DIAGNOSIS — Z7982 Long term (current) use of aspirin: Secondary | ICD-10-CM | POA: Diagnosis not present

## 2021-02-05 DIAGNOSIS — Z79899 Other long term (current) drug therapy: Secondary | ICD-10-CM | POA: Diagnosis not present

## 2021-02-05 DIAGNOSIS — C9000 Multiple myeloma not having achieved remission: Secondary | ICD-10-CM

## 2021-02-05 DIAGNOSIS — D696 Thrombocytopenia, unspecified: Secondary | ICD-10-CM | POA: Diagnosis not present

## 2021-02-05 DIAGNOSIS — G9389 Other specified disorders of brain: Secondary | ICD-10-CM | POA: Diagnosis not present

## 2021-02-05 DIAGNOSIS — M25461 Effusion, right knee: Principal | ICD-10-CM

## 2021-02-05 DIAGNOSIS — R9431 Abnormal electrocardiogram [ECG] [EKG]: Secondary | ICD-10-CM | POA: Diagnosis not present

## 2021-02-05 DIAGNOSIS — I10 Essential (primary) hypertension: Secondary | ICD-10-CM | POA: Insufficient documentation

## 2021-02-05 DIAGNOSIS — R4182 Altered mental status, unspecified: Secondary | ICD-10-CM | POA: Diagnosis not present

## 2021-02-05 DIAGNOSIS — E119 Type 2 diabetes mellitus without complications: Secondary | ICD-10-CM | POA: Diagnosis not present

## 2021-02-05 DIAGNOSIS — R410 Disorientation, unspecified: Secondary | ICD-10-CM | POA: Diagnosis not present

## 2021-02-05 DIAGNOSIS — E039 Hypothyroidism, unspecified: Secondary | ICD-10-CM | POA: Insufficient documentation

## 2021-02-05 DIAGNOSIS — N3000 Acute cystitis without hematuria: Secondary | ICD-10-CM | POA: Diagnosis not present

## 2021-02-05 DIAGNOSIS — M25561 Pain in right knee: Principal | ICD-10-CM

## 2021-02-05 DIAGNOSIS — I6782 Cerebral ischemia: Secondary | ICD-10-CM | POA: Diagnosis not present

## 2021-02-05 DIAGNOSIS — F0390 Unspecified dementia without behavioral disturbance: Secondary | ICD-10-CM | POA: Diagnosis not present

## 2021-02-05 DIAGNOSIS — R29818 Other symptoms and signs involving the nervous system: Secondary | ICD-10-CM | POA: Diagnosis not present

## 2021-02-05 LAB — CBC WITH DIFFERENTIAL/PLATELET
Abs Immature Granulocytes: 0.01 10*3/uL (ref 0.00–0.07)
Basophils Absolute: 0 10*3/uL (ref 0.0–0.1)
Basophils Relative: 0 %
Eosinophils Absolute: 0.1 10*3/uL (ref 0.0–0.5)
Eosinophils Relative: 1 %
HCT: 30.2 % — ABNORMAL LOW (ref 36.0–46.0)
Hemoglobin: 9.7 g/dL — ABNORMAL LOW (ref 12.0–15.0)
Immature Granulocytes: 0 %
Lymphocytes Relative: 11 %
Lymphs Abs: 0.4 10*3/uL — ABNORMAL LOW (ref 0.7–4.0)
MCH: 33.4 pg (ref 26.0–34.0)
MCHC: 32.1 g/dL (ref 30.0–36.0)
MCV: 104.1 fL — ABNORMAL HIGH (ref 80.0–100.0)
Monocytes Absolute: 0.4 10*3/uL (ref 0.1–1.0)
Monocytes Relative: 11 %
Neutro Abs: 2.8 10*3/uL (ref 1.7–7.7)
Neutrophils Relative %: 77 %
Platelets: 96 10*3/uL — ABNORMAL LOW (ref 150–400)
RBC: 2.9 MIL/uL — ABNORMAL LOW (ref 3.87–5.11)
RDW: 15.7 % — ABNORMAL HIGH (ref 11.5–15.5)
WBC: 3.7 10*3/uL — ABNORMAL LOW (ref 4.0–10.5)
nRBC: 0 % (ref 0.0–0.2)

## 2021-02-05 LAB — CBC
HCT: 31.6 % — ABNORMAL LOW (ref 36.0–46.0)
Hemoglobin: 10.2 g/dL — ABNORMAL LOW (ref 12.0–15.0)
MCH: 33.2 pg (ref 26.0–34.0)
MCHC: 32.3 g/dL (ref 30.0–36.0)
MCV: 102.9 fL — ABNORMAL HIGH (ref 80.0–100.0)
Platelets: 91 10*3/uL — ABNORMAL LOW (ref 150–400)
RBC: 3.07 MIL/uL — ABNORMAL LOW (ref 3.87–5.11)
RDW: 15.6 % — ABNORMAL HIGH (ref 11.5–15.5)
WBC: 3.9 10*3/uL — ABNORMAL LOW (ref 4.0–10.5)
nRBC: 0 % (ref 0.0–0.2)

## 2021-02-05 LAB — COMPREHENSIVE METABOLIC PANEL
ALT: 27 U/L (ref 0–44)
ALT: 26 U/L (ref 0–44)
AST: 31 U/L (ref 15–41)
AST: 30 U/L (ref 15–41)
Albumin: 3.1 g/dL — ABNORMAL LOW (ref 3.5–5.0)
Albumin: 3 g/dL — ABNORMAL LOW (ref 3.5–5.0)
Alkaline Phosphatase: 58 U/L (ref 38–126)
Alkaline Phosphatase: 56 U/L (ref 38–126)
Anion gap: 7 (ref 5–15)
Anion gap: 4 — ABNORMAL LOW (ref 5–15)
BUN: 24 mg/dL — ABNORMAL HIGH (ref 8–23)
BUN: 26 mg/dL — ABNORMAL HIGH (ref 8–23)
CO2: 25 mmol/L (ref 22–32)
CO2: 27 mmol/L (ref 22–32)
Calcium: 8.9 mg/dL (ref 8.9–10.3)
Calcium: 8.7 mg/dL — ABNORMAL LOW (ref 8.9–10.3)
Chloride: 107 mmol/L (ref 98–111)
Chloride: 107 mmol/L (ref 98–111)
Creatinine, Ser: 1.16 mg/dL — ABNORMAL HIGH (ref 0.44–1.00)
Creatinine, Ser: 1.29 mg/dL — ABNORMAL HIGH (ref 0.44–1.00)
GFR, Estimated: 49 mL/min — ABNORMAL LOW (ref >60)
GFR, Estimated: 43 mL/min — ABNORMAL LOW (ref >60)
Glucose, Bld: 150 mg/dL — ABNORMAL HIGH (ref 70–99)
Glucose, Bld: 118 mg/dL — ABNORMAL HIGH (ref 70–99)
Potassium: 3.7 mmol/L (ref 3.5–5.1)
Potassium: 3.8 mmol/L (ref 3.5–5.1)
Sodium: 139 mmol/L (ref 135–145)
Sodium: 138 mmol/L (ref 135–145)
Total Bilirubin: 0.6 mg/dL (ref 0.3–1.2)
Total Bilirubin: 0.4 mg/dL (ref 0.3–1.2)
Total Protein: 5.8 g/dL — ABNORMAL LOW (ref 6.5–8.1)
Total Protein: 6 g/dL — ABNORMAL LOW (ref 6.5–8.1)

## 2021-02-05 LAB — URINALYSIS, ROUTINE W REFLEX MICROSCOPIC
Bilirubin Urine: NEGATIVE
Glucose, UA: NEGATIVE mg/dL
Hgb urine dipstick: NEGATIVE
Ketones, ur: NEGATIVE mg/dL
Nitrite: NEGATIVE
Protein, ur: NEGATIVE mg/dL
Specific Gravity, Urine: 1.009 (ref 1.005–1.030)
pH: 7 (ref 5.0–8.0)

## 2021-02-05 LAB — DIFFERENTIAL
Abs Immature Granulocytes: 0.02 10*3/uL (ref 0.00–0.07)
Basophils Absolute: 0 10*3/uL (ref 0.0–0.1)
Basophils Relative: 0 %
Eosinophils Absolute: 0 10*3/uL (ref 0.0–0.5)
Eosinophils Relative: 1 %
Immature Granulocytes: 1 %
Lymphocytes Relative: 10 %
Lymphs Abs: 0.4 10*3/uL — ABNORMAL LOW (ref 0.7–4.0)
Monocytes Absolute: 0.6 10*3/uL (ref 0.1–1.0)
Monocytes Relative: 15 %
Neutro Abs: 2.9 10*3/uL (ref 1.7–7.7)
Neutrophils Relative %: 73 %

## 2021-02-05 LAB — RAPID URINE DRUG SCREEN, HOSP PERFORMED
Amphetamines: NOT DETECTED
Barbiturates: NOT DETECTED
Benzodiazepines: NOT DETECTED
Cocaine: NOT DETECTED
Opiates: NOT DETECTED
Tetrahydrocannabinol: NOT DETECTED

## 2021-02-05 LAB — PROTIME-INR
INR: 1.1 (ref 0.8–1.2)
Prothrombin Time: 13.8 seconds (ref 11.4–15.2)

## 2021-02-05 LAB — APTT: aPTT: 30 seconds (ref 24–36)

## 2021-02-05 LAB — ETHANOL: Alcohol, Ethyl (B): <10 mg/dL (ref <10)

## 2021-02-05 LAB — LACTATE DEHYDROGENASE: LDH: 187 U/L (ref 98–192)

## 2021-02-05 MED ORDER — SODIUM CHLORIDE 0.9 % IV SOLN
100.0000 mL/h | INTRAVENOUS | Status: DC
  Administered 2021-02-05: 100 mL/h via INTRAVENOUS

## 2021-02-05 MED ORDER — CEPHALEXIN 500 MG PO CAPS: 500.0000 mg | ORAL_CAPSULE | Freq: Four times a day (QID) | ORAL | 0 refills | Status: DC

## 2021-02-05 MED ORDER — CEPHALEXIN 500 MG PO CAPS: 500.0000 mg | ORAL_CAPSULE | Freq: Once | ORAL | Status: DC

## 2021-02-05 MED ORDER — LORAZEPAM 2 MG/ML IJ SOLN
0.5000 mg | Freq: Once | INTRAMUSCULAR | Status: AC | PRN
  Administered 2021-02-05: 0.5 mg via INTRAVENOUS
  Filled 2021-02-05: qty 1

## 2021-02-05 MED ORDER — SODIUM CHLORIDE 0.9 % IV BOLUS
500.0000 mL | Freq: Once | INTRAVENOUS | Status: AC
  Administered 2021-02-05: 500 mL via INTRAVENOUS

## 2021-02-05 MED ORDER — SODIUM CHLORIDE 0.9 % IV SOLN
1.0000 g | Freq: Once | INTRAVENOUS | Status: AC
  Administered 2021-02-05: 1 g via INTRAVENOUS
  Filled 2021-02-05: qty 10

## 2021-02-05 NOTE — ED Provider Notes (Signed)
[facility] EMERGENCY DEPARTMENT Provider Note   CSN: [id number] Arrival date & time: 02/05/21  1244     History Chief Complaint  Patient presents with  . Weakness    Amanda Wu is a 75 y.o. female.  HPI   Patient initially presented to the oncology clinic today for follow-up on her multiple myeloma.  At the office they took her blood pressure of 69/35.  Patient was also noted to be confused patient was sent to the ED for evaluation.  Patient states that she was going there for issues of persistent diarrhea.  Patient states this happens several times per day.  Patient denies any issues with fevers or chills.  She denies trouble with headache.  No chest pain or abdominal pain.  No nausea or vomiting.  Prior records revealed the patient does have a history of dementia.  She was seen at the nursing home just 4 days ago.  The notes indicate the patient had been having issues with diarrhea but that had resolved and she had gained weight over the past month.  Past Medical History:  Diagnosis Date  . Benign hypertension   . Cataract   . 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 Diabetes mellitus without complication ([place])   . H/O partial nephrectomy   . Hypokalemia   . Hypothyroidism   . Impaired cognition   . Multiple myeloma (HCC)    Dr Maylon Peppers, [facility]  . Thyroid disease     Patient Active Problem List   Diagnosis Date Noted  . Aortic atherosclerosis ([place]) 01/07/2021  . Pancreatic insufficiency 01/07/2021  . Ileus ([place]) 01/02/2021  . Bradycardia 12/11/2020  . First degree AV block 12/11/2020  . Diarrhea 09/10/2020  . Post-menopausal osteoporosis 08/15/2020  . Hypertension associated with type 2 diabetes mellitus ([place]) 07/27/2020  . Vascular dementia with behavior disturbance ([place]) 07/27/2020  . Herpes 07/27/2020  . Hypokalemia 07/27/2020  . Macrocytic anemia 07/19/2020  . Adult failure to thrive 07/19/2020  . Medicare annual wellness visit, subsequent 01/07/2020  .  Multiple myeloma ([place]) 07/09/2017  . Benign hypertension 07/09/2017  . Hypothyroidism 07/09/2017  . Diabetes mellitus without complication ([place]) 32/44/0102  . Lumbar compression fracture ([place]) 07/09/2017  . Thrombocytopenia ([place]) 07/09/2017    Past Surgical History:  Procedure Laterality Date  . ABDOMINAL HYSTERECTOMY    . CRANIOTOMY     for lymphoma  . YAG LASER APPLICATION Right 07/09/3663   Procedure: YAG LASER APPLICATION;  Surgeon: [name], MD;  Location: [facility];  Service: Ophthalmology;  Laterality: Right;     OB History   No obstetric history on file.     Family History  Problem Relation Age of Onset  . Depression Mother   . Diabetes Mother   . Heart disease Father   . Cancer - Prostate Brother   . Cancer Brother   . Cancer Sister     Social History   Tobacco Use  . Smoking status: Never Smoker  . Smokeless tobacco: Never Used  Vaping Use  . Vaping Use: Never used  Substance Use Topics  . Alcohol use: No  . Drug use: No    Home Medications Prior to Admission medications   Medication Sig Start Date End Date Taking? Authorizing Provider  acetaminophen (TYLENOL) 500 MG tablet Take 1,000 mg by mouth every 6 (six) hours as needed for mild pain.    [provider]  acyclovir (ZOVIRAX) 400 MG tablet TAKE 1 TABLET BY MOUTH TWICE DAILY 07/18/20  Derek Jack, MD  aspirin EC 81 MG tablet Take 81 mg by mouth daily.    [provider]  Calcium Carbonate-Vitamin D (CALCIUM-VITAMIN D3 PO) Take 400 Units by mouth daily in the afternoon. 07/17/20   [provider]  cholestyramine (QUESTRAN) 4 g packet Take 4 g by mouth 2 (two) times daily between meals. 12/21/20   [provider]  Lactobacillus Probiotic TABS Take 2 tablets by mouth daily. 09/12/20   [provider]  levothyroxine (SYNTHROID, LEVOTHROID) 25 MCG tablet Take 25 mcg by mouth daily before breakfast.    [provider]  lipase/protease/amylase  (CREON) 36000 UNITS CPEP capsule Take 36,000 Units by mouth 2 (two) times daily. With snacks between meals at 10 am and 8 pm 12/27/20   [provider]  lipase/protease/amylase (CREON) 36000 UNITS CPEP capsule Take 72,000 Units by mouth 3 (three) times daily with meals. 8 am, 12 pm, and 6 pm    [provider]  loperamide (IMODIUM A-D) 2 MG tablet Take 4 mg by mouth 2 (two) times daily as needed for diarrhea or loose stools. 08/21/20   [provider]  losartan (COZAAR) 50 MG tablet Take 1 tablet (50 mg total) by mouth daily. 05/24/20   Derek Jack, MD  Multiple Vitamins-Minerals (MULTIVITAMIN WOMEN 50+ PO) Take by mouth daily.  12/26/10   [provider]  NON FORMULARY Regular diet no dairy products. 12/26/20   [provider]  NON FORMULARY Wanderguard [phone] to ankle for safety awareness. Check placement and function qshift. Every Shift Day, Evening, Night 07/19/20   [provider]  potassium chloride SA (KLOR-CON) 20 MEQ tablet Take 20 mEq by mouth daily.  08/11/20   [provider]    Allergies    Lotensin [benazepril]  Review of Systems   Review of Systems  All other systems reviewed and are negative.   Physical Exam Updated Vital Signs BP (!) 142/113   Pulse 70   Temp 97.9 F (36.6 C) (Oral)   Resp 19   Ht 1.753 m (5' 9")   Wt 49.9 kg   SpO2 100%   BMI 16.24 kg/m   Physical Exam Vitals and nursing note reviewed.  Constitutional:      General: She is not in acute distress.    Appearance: She is well-developed and well-nourished.  HENT:     Head: Normocephalic and atraumatic.     Right Ear: External ear normal.     Left Ear: External ear normal.  Eyes:     General: No scleral icterus.       Right eye: No discharge.        Left eye: No discharge.     Conjunctiva/sclera: Conjunctivae normal.  Neck:     Trachea: No tracheal deviation.  Cardiovascular:     Rate and Rhythm: Normal rate and regular rhythm.      Pulses: Intact distal pulses.  Pulmonary:     Effort: Pulmonary effort is normal. No respiratory distress.     Breath sounds: Normal breath sounds. No stridor. No wheezing or rales.  Abdominal:     General: Bowel sounds are normal. There is no distension.     Palpations: Abdomen is soft.     Tenderness: There is no abdominal tenderness. There is no guarding or rebound.  Musculoskeletal:        General: No tenderness or edema.     Cervical back: Neck supple.  Skin:    General: Skin is warm and  dry.     Findings: No rash.  Neurological:     Mental Status: She is alert. She is disoriented.     Cranial Nerves: No cranial nerve deficit (no facial droop, extraocular movements intact, no slurred speech).     Sensory: No sensory deficit.     Motor: No abnormal muscle tone or seizure activity.     Coordination: Coordination normal.     Deep Tendon Reflexes: Strength normal.     Comments: Patient able lift both arms off the bed, able to lift both legs off the bed, she follows commands, answers questions appropriately but is confused about the date location  Psychiatric:        Mood and Affect: Mood and affect normal.     ED Results / Procedures / Treatments   Labs (all labs ordered are listed, but only abnormal results are displayed) Labs Reviewed  CBC - Abnormal; Notable for the following components:      Result Value   WBC 3.9 (*)    RBC 3.07 (*)    Hemoglobin 10.2 (*)    HCT 31.6 (*)    MCV 102.9 (*)    RDW 15.6 (*)    Platelets 91 (*)    All other components within normal limits  DIFFERENTIAL - Abnormal; Notable for the following components:   Lymphs Abs 0.4 (*)    All other components within normal limits  COMPREHENSIVE METABOLIC PANEL - Abnormal; Notable for the following components:   Glucose, Bld 118 (*)    BUN 26 (*)    Creatinine, Ser 1.29 (*)    Calcium 8.7 (*)    Total Protein 6.0 (*)    Albumin 3.0 (*)    GFR, Estimated 43 (*)    Anion gap 4 (*)    All  other components within normal limits  URINALYSIS, ROUTINE W REFLEX MICROSCOPIC - Abnormal; Notable for the following components:   Leukocytes,Ua SMALL (*)    Bacteria, UA FEW (*)    All other components within normal limits  C DIFFICILE QUICK SCREEN W PCR REFLEX  URINE CULTURE  ETHANOL  PROTIME-INR  APTT  RAPID URINE DRUG SCREEN, HOSP PERFORMED    EKG EKG Interpretation  Date/Time:  Tuesday February 05 2021 13:56:34 EST Ventricular Rate:  69 PR Interval:    QRS Duration: 96 QT Interval:  467 QTC Calculation: 501 R Axis:   28 Text Interpretation: Sinus rhythm Ventricular trigeminy Prolonged PR interval Anteroseptal infarct, old Nonspecific T abnormalities, lateral leads , more  prominent since last tracing Prolonged QT interval Confirmed by Dorie Rank [phone]) on 02/05/2021 2:17:39 PM   Radiology CT HEAD WO CONTRAST  Result Date: 02/05/2021 CLINICAL DATA:  Focal neuro deficit stroke suspected EXAM: CT HEAD WITHOUT CONTRAST TECHNIQUE: Contiguous axial images were obtained from the base of the skull through the vertex without intravenous contrast. COMPARISON:  Head CT May 03, 2020 FINDINGS: Brain: Similar appearance of bifrontal encephalomalacia consistent with prior infarction/insult. No evidence of acute large vascular territory infarction. No evidence of acute intracranial hemorrhage or extra-axial fluid collection. Vascular: No hyperdense vessel or unexpected calcification. Skull: Status post left frontal craniotomy. Lucencies are again noted throughout the calvarium which may be related to history of multiple myeloma. Sinuses/Orbits: No acute finding. Other: None. IMPRESSION: 1. No acute intracranial abnormality. Please note if concern for acute infarction MRI would be more sensitive. 2. Similar appearance of bifrontal encephalomalacia consistent with prior infarction/insult. Electronically Signed   By: Dahlia Bailiff MD  On: 02/05/2021 14:59    Procedures Procedures    Medications Ordered in ED Medications  sodium chloride 0.9 % bolus 500 mL (0 mLs Intravenous Stopped 02/05/21 1557)    Followed by  0.9 %  sodium chloride infusion (has no administration in time range)  LORazepam (ATIVAN) injection 0.5 mg (0.5 mg Intravenous Given 02/05/21 1557)    ED Course  I have reviewed the triage vital signs and the nursing notes.  Pertinent labs & imaging results that were available during my care of the patient were reviewed by me and considered in my medical decision making (see chart for details).  Clinical Course as of 02/05/21 1648  Tue Feb 05, 2021  1430 CBC shows stable anemia.  White blood cell count is stable. [JK]  1430 Renal insufficiency seems similar to baseline. [JK]    Clinical Course User Index [JK] Dorie Rank, MD   MDM Rules/Calculators/A&P                          Pt presented sent from the oncology office for hypotension , confusion.  In the ED pt is alert, answering questions, confused about date, records indicate history of dementia.  Blood pressure normal here. Has remained stable. Initial labs reassuring.  No AKI, uremia.  CT without acute findings.  UA pending.  With her mental status change at the oncology office will order MRI to rule out occult cva.  Overall reassuring at this time.  Care turned over to Dr Roderic Palau. Final Clinical Impression(s) / ED Diagnoses Final diagnoses:  Confusion    Rx / DC Orders ED Discharge Orders    None       Dorie Rank, MD 02/05/21 [phone]

## 2021-02-05 NOTE — Discharge Instructions (Addendum)
Follow-up with your doctor either the end of this week or next week for recheck °

## 2021-02-05 NOTE — Progress Notes (Signed)
Amanda Wu, Amanda Wu   CLINIC:  Medical Oncology/Hematology  PCP:  Amanda Fee, Amanda Wu [address] [place] [address] [phone]   REASON FOR VISIT:  Follow-up for multiple myeloma  PRIOR THERAPY: None  NGS Results: Not done  CURRENT THERAPY: Velcade & Decadron 3 weeks on, 1 week off; Revlimid 2 weeks on, 2 weeks off  BRIEF ONCOLOGIC HISTORY:  Oncology History  Multiple myeloma ([place])  07/09/2017 Initial Diagnosis   Multiple myeloma ([place])   06/01/2018 -  Chemotherapy    Patient is on Treatment Plan: MYELOMA MAINTENANCE BORTEZOMIB SQ D1,8,15 / DEXAMETHASONE ORAL D1,8,15 Q28D         CANCER STAGING: Cancer Staging No matching staging information was found for the patient.  INTERVAL HISTORY:  Amanda Wu, a 75 y.o. female,  seen in the clinic for follow-up and next cycle of chemotherapy.  She is here for Velcade. We have held her Revlimid at last visit.  Her diarrhea has reportedly improved.  Appetite is 50%.  Energy levels are 75%.  She was unable to stand to get her weight.   REVIEW OF SYSTEMS:  Review of Systems  HENT:   Positive for trouble swallowing.   Psychiatric/Behavioral: Positive for depression. The patient is nervous/anxious.   All other systems reviewed and are negative.   PAST MEDICAL/SURGICAL HISTORY:  Past Medical History:  Diagnosis Date  . Benign hypertension   . Cataract   . 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 Diabetes mellitus without complication ([place])   . H/O partial nephrectomy   . Hypokalemia   . Hypothyroidism   . Impaired cognition   . Multiple myeloma (HCC)    Dr Maylon Peppers, [facility]  . Thyroid disease    Past Surgical History:  Procedure Laterality Date  . ABDOMINAL HYSTERECTOMY    . CRANIOTOMY     for lymphoma  . YAG LASER APPLICATION Right 2/95/6213   Procedure: YAG LASER APPLICATION;  Surgeon: [name], MD;  Location: [facility];  Service:  Ophthalmology;  Laterality: Right;    SOCIAL HISTORY:  Social History   Socioeconomic History  . Marital status: Single    Spouse name: Not on file  . Number of children: Not on file  . Years of education: Not on file  . Highest education level: Not on file  Occupational History  . Occupation: retired   Tobacco Use  . Smoking status: Never Smoker  . Smokeless tobacco: Never Used  Vaping Use  . Vaping Use: Never used  Substance and Sexual Activity  . Alcohol use: No  . Drug use: No  . Sexual activity: Never  Other Topics Concern  . Not on file  Social History Narrative   Long term resident of [facility]    Social Determinants of Health   Financial Resource Strain: Low Risk   . Difficulty of Paying Living Expenses: Not very hard  Food Insecurity: No Food Insecurity  . Worried About Charity fundraiser in the Last Year: Never true  . Ran Out of Food in the Last Year: Never true  Transportation Needs: No Transportation Needs  . Lack of Transportation (Medical): No  . Lack of Transportation (Non-Medical): No  Physical Activity: Inactive  . Days of Exercise per Week: 0 days  . Minutes of Exercise per Session: 0 min  Stress: No Stress Concern Present  . Feeling of Stress : Not at all  Social Connections:  Moderately Isolated  . Frequency of Communication with Friends and Family: More than three times a week  . Frequency of Social Gatherings with Friends and Family: Once a week  . Attends Religious Services: More than 4 times per year  . Active Member of Clubs or Organizations: No  . Attends Archivist Meetings: Never  . Marital Status: Never married  Human resources officer Violence: Not on file    FAMILY HISTORY:  Family History  Problem Relation Age of Onset  . Depression Mother   . Diabetes Mother   . Heart disease Father   . Cancer - Prostate Brother   . Cancer Brother   . Cancer Sister     CURRENT MEDICATIONS:  No current facility-administered medications for  this visit.   Current Outpatient Medications  Medication Sig Dispense Refill  . acetaminophen (TYLENOL) 500 MG tablet Take 1,000 mg by mouth every 6 (six) hours as needed for mild pain.    Marland Kitchen acyclovir (ZOVIRAX) 400 MG tablet TAKE 1 TABLET BY MOUTH TWICE DAILY 60 tablet 2  . aspirin EC 81 MG tablet Take 81 mg by mouth daily.    . Calcium Carbonate-Vitamin D (CALCIUM-VITAMIN D3 PO) Take 400 Units by mouth daily in the afternoon.    . cholestyramine (QUESTRAN) 4 g packet Take 4 g by mouth 2 (two) times daily between meals.    . Lactobacillus Probiotic TABS Take 2 tablets by mouth daily.    Marland Kitchen levothyroxine (SYNTHROID, LEVOTHROID) 25 MCG tablet Take 25 mcg by mouth daily before breakfast.    . lipase/protease/amylase (CREON) 36000 UNITS CPEP capsule Take 36,000 Units by mouth 2 (two) times daily. With snacks between meals at 10 am and 8 pm    . lipase/protease/amylase (CREON) 36000 UNITS CPEP capsule Take 72,000 Units by mouth 3 (three) times daily with meals. 8 am, 12 pm, and 6 pm    . loperamide (IMODIUM A-D) 2 MG tablet Take 4 mg by mouth 2 (two) times daily as needed for diarrhea or loose stools.    Marland Kitchen losartan (COZAAR) 50 MG tablet Take 1 tablet (50 mg total) by mouth daily. 30 tablet 4  . Multiple Vitamins-Minerals (MULTIVITAMIN WOMEN 50+ PO) Take by mouth daily.     . NON FORMULARY Regular diet no dairy products.    . NON FORMULARY Wanderguard [phone] to ankle for safety awareness. Check placement and function qshift. Every Shift Day, Evening, Night    . potassium chloride SA (KLOR-CON) 20 MEQ tablet Take 20 mEq by mouth daily.      Facility-Administered Medications Ordered in Other Visits  Medication Dose Route Frequency Provider Last Rate Last Admin  . 0.9 %  sodium chloride infusion  100 mL/hr Intravenous Continuous Dorie Rank, MD 100 mL/hr at 02/05/21 1739 100 mL/hr at 02/05/21 1739  . prochlorperazine (COMPAZINE) tablet 10 mg  10 mg Oral Once Derek Jack, MD        ALLERGIES:   Allergies  Allergen Reactions  . Lotensin [Benazepril]     PHYSICAL EXAM:  Performance status (ECOG): 2 - Symptomatic, <50% confined to bed  Vitals:   02/05/21 1134  BP: (!) 69/35  Pulse: (!) 52  Resp: 16  Temp: (!) 96.6 F (35.9 C)  SpO2: 100%   Wt Readings from Last 3 Encounters:  02/05/21 110 lb (49.9 kg)  02/05/21 110 lb 10.7 oz (50.2 kg)  02/01/21 115 lb 6.4 oz (52.3 kg)   Physical Exam Vitals reviewed.  Constitutional:  Comments: Wheelchair-bound  Cardiovascular:     Rate and Rhythm: Normal rate and regular rhythm.     Pulses: Normal pulses.     Heart sounds: Normal heart sounds.  Pulmonary:     Effort: Pulmonary effort is normal.     Breath sounds: Normal breath sounds.  Musculoskeletal:     Right lower leg: No edema.     Left lower leg: No edema.   She is very drowsy and difficult to arouse.  LABORATORY DATA:  I have reviewed the labs as listed.  CBC Latest Ref Rng & Units 02/05/2021 02/05/2021 01/29/2021  WBC 4.0 - 10.5 K/uL 3.9(L) 3.7(L) 3.3(L)  Hemoglobin 12.0 - 15.0 g/dL 10.2(L) 9.7(L) 10.8(L)  Hematocrit 36.0 - 46.0 % 31.6(L) 30.2(L) 33.6(L)  Platelets 150 - 400 K/uL 91(L) 96(L) 97(L)   CMP Latest Ref Rng & Units 02/05/2021 02/05/2021 01/29/2021  Glucose 70 - 99 mg/dL 118(H) 150(H) 158(H)  BUN 8 - 23 mg/dL 26(H) 24(H) 19  Creatinine 0.44 - 1.00 mg/dL 1.29(H) 1.16(H) 1.23(H)  Sodium 135 - 145 mmol/L 138 139 140  Potassium 3.5 - 5.1 mmol/L 3.8 3.7 3.6  Chloride 98 - 111 mmol/L 107 107 105  CO2 22 - 32 mmol/L _0 Calcium 8.9 - 10.3 mg/dL 8.7(L) 8.9 9.3  Total Protein 6.5 - 8.1 g/dL 6.0(L) 5.8(L) 6.0(L)  Total Bilirubin 0.3 - 1.2 mg/dL 0.4 0.6 0.6  Alkaline Phos 38 - 126 U/L 56 58 57  AST 15 - 41 U/L _1 ALT 0 - 44 U/L _2 Lab Results  Component Value Date   LDH 187 02/05/2021   LDH 180 01/22/2021   LDH 189 01/01/2021   Lab Results  Component Value Date   TOTALPROTELP 5.8 (L) 01/01/2021   ALBUMINELP 3.1 01/01/2021    A1GS 0.2 01/01/2021   A2GS 0.6 01/01/2021   BETS 0.7 01/01/2021   GAMS 1.1 01/01/2021   MSPIKE 0.4 (H) 01/01/2021   SPEI Comment 01/01/2021    Lab Results  Component Value Date   KPAFRELGTCHN 21.3 (H) 01/01/2021   LAMBDASER 19.6 01/01/2021   KAPLAMBRATIO 1.09 01/01/2021    DIAGNOSTIC IMAGING:  I have independently reviewed the scans and discussed with the patient. CT HEAD WO CONTRAST  Result Date: 02/05/2021 CLINICAL DATA:  Focal neuro deficit stroke suspected EXAM: CT HEAD WITHOUT CONTRAST TECHNIQUE: Contiguous axial images were obtained from the base of the skull through the vertex without intravenous contrast. COMPARISON:  Head CT May 03, 2020 FINDINGS: Brain: Similar appearance of bifrontal encephalomalacia consistent with prior infarction/insult. No evidence of acute large vascular territory infarction. No evidence of acute intracranial hemorrhage or extra-axial fluid collection. Vascular: No hyperdense vessel or unexpected calcification. Skull: Status post left frontal craniotomy. Lucencies are again noted throughout the calvarium which may be related to history of multiple myeloma. Sinuses/Orbits: No acute finding. Other: None. IMPRESSION: 1. No acute intracranial abnormality. Please note if concern for acute infarction MRI would be more sensitive. 2. Similar appearance of bifrontal encephalomalacia consistent with prior infarction/insult. Electronically Signed   By: Dahlia Bailiff MD   On: 02/05/2021 14:59   MR BRAIN WO CONTRAST  Result Date: 02/05/2021 CLINICAL DATA:  Mental status change EXAM: MRI HEAD WITHOUT CONTRAST TECHNIQUE: Multiplanar, multiecho pulse sequences of the brain and surrounding structures were obtained without intravenous contrast. COMPARISON:  None. FINDINGS: Brain: There is no acute infarction or intracranial hemorrhage. Bifrontal encephalomalacia/gliosis primarily involving superior gyri. Additional patchy T2 hyperintensity in the  supratentorial and pontine  white matter is nonspecific but probably reflects chronic microvascular ischemic changes. No intracranial mass identified. A small calcified meningioma along the left anterior clinoid process has been questioned on prior CT imaging. This is not well evaluated on this noncontrast study due to small size. There is no hydrocephalus or extra-axial fluid collection. Prominence of the ventricles and sulci reflects generalized parenchymal volume loss superimposed on ex vacuo dilatation related to above. Vascular: Major vessel flow voids at the skull base are preserved. Skull and upper cervical spine: Bifrontal craniotomy. No aggressive osseous lesion. Sinuses/Orbits: Paranasal sinuses are aerated. Bilateral lens replacements. Other: Sella is unremarkable.  Mastoid air cells are clear. IMPRESSION: No evidence of recent infarction, hemorrhage, or mass. Bifrontal encephalomalacia/gliosis. Additional chronic microvascular ischemic changes. Questioned small left anterior clinoid process calcified meningioma on prior CT imaging is not well evaluated due to size and lack of contrast. Electronically Signed   By: Macy Mis M.D.   On: 02/05/2021 16:56     ASSESSMENT:  1. IgG lambda multiple myeloma: -She is on Revlimid 10 mg 2 weeks on/2 weeks of along with Velcade 3 weeks on/1 week off. Dexamethasone 10 mg on days of Velcade. -Myeloma panel on 04/17/2020 shows M spike 0.5 g. Kappa light chains are 44. Lambda light chains are 34. Ratio is 1.3. -Resident of [facility] since 07/19/2020.  2. 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   PLAN:  1. IgG lambda multiple myeloma: -We have discontinued her Revlimid secondary to diarrhea. -She is also using Questran.  Reportedly diarrhea improved. -Today she is very drowsy which is unusual for her.  She has difficulty responding.  Her blood pressure is also low at 69/35. -I have recommended further evaluation in the ER to rule out  any infectious etiology for hypotension. -While we are transferring her to the ER, she reportedly had a big bowel movement and was slightly more alert.  I have called and talked to the ER doctor and updated him of the patient's arrival. -We will hold her Velcade today.  We will reevaluate her based on findings in the ER.  2. CKD: -Creatinine today is 1.29 and had to her baseline.  3. Weight loss: -She lost some weight at last visit.  She could not be weighed today.  4. Dementia: -She is continuing Aricept 10 mg daily.  5. Shingles prophylaxis: -Continue acyclovir 400 mg twice daily.  6. Myeloma bone disease: -She was treated with Zometa for several years which was discontinued.  7. Macrocytic anemia: -This is from myelosuppression and CKD.  Hemoglobin 10.2.  8.Diarrhea: -Continue Questran.  GI work-up was negative. -We held her Revlimid.  Since then her diarrhea improved.   Orders placed this encounter:  No orders of the defined types were placed in this encounter.    Derek Jack, MD [place] [phone]   I, Milinda Antis, am acting as a scribe for Dr. Sanda Linger.  I, Derek Jack MD, have reviewed the above documentation for accuracy and completeness, and I agree with the above.

## 2021-02-05 NOTE — Progress Notes (Signed)
Patient stated she has no diarrhea.  Family in room and verified no problems with diarrhea.  No s/s of distress noted.   Patient assessed by Dr. Delton Coombes with verbal order for the patient to go to the emergency room for altered mental status and hypotension.  Emergency room charge nurse called with report given.  Patient to go to room 11.  Notified the nurse of patients dementia and the [name] is calling the family.

## 2021-02-05 NOTE — ED Provider Notes (Signed)
Patient's MRI of the brain was unremarkable.  Urinalysis suggest urinary tract infection.  We will culture the urine and start on Keflex.  I spoke with Dr. Delton Coombes and he agrees with the treatment.  Patient was a little sleepy after the Ativan she was observed for a while and awoke some but is still sleeping.  She will awaken answer questions appropriately and then go back to sleep.  She is safe to be transferred back to the nursing home   Milton Ferguson, MD 02/05/21 1906

## 2021-02-05 NOTE — ED Triage Notes (Signed)
Pt from [facility]. HX of dementia. Sent over for hypotension, weakness, and increased confusion

## 2021-02-05 NOTE — ED Notes (Signed)

## 2021-02-05 NOTE — ED Notes (Signed)
Patient transported to CT 

## 2021-02-05 NOTE — ED Notes (Signed)
Lab at bedside at this time.  

## 2021-02-06 ENCOUNTER — Non-Acute Institutional Stay (SKILLED_NURSING_FACILITY): Payer: Medicare PPO | Admitting: Adult Health

## 2021-02-06 ENCOUNTER — Encounter: Payer: Self-pay | Admitting: Adult Health

## 2021-02-06 DIAGNOSIS — N3000 Acute cystitis without hematuria: Secondary | ICD-10-CM | POA: Diagnosis not present

## 2021-02-06 LAB — IMMUNOFIXATION ELECTROPHORESIS
IgA: 78 mg/dL (ref 64–422)
IgG (Immunoglobin G), Serum: 962 mg/dL (ref 586–1602)
IgM (Immunoglobulin M), Srm: 10 mg/dL — ABNORMAL LOW (ref 26–217)
Total Protein ELP: 5.5 g/dL — ABNORMAL LOW (ref 6.0–8.5)

## 2021-02-06 LAB — PROTEIN ELECTROPHORESIS, SERUM
A/G Ratio: 1.1 (ref 0.7–1.7)
Albumin ELP: 3 g/dL (ref 2.9–4.4)
Alpha-1-Globulin: 0.3 g/dL (ref 0.0–0.4)
Alpha-2-Globulin: 0.7 g/dL (ref 0.4–1.0)
Beta Globulin: 0.7 g/dL (ref 0.7–1.3)
Gamma Globulin: 1 g/dL (ref 0.4–1.8)
Globulin, Total: 2.7 g/dL (ref 2.2–3.9)
M-Spike, %: 0.4 g/dL — ABNORMAL HIGH
Total Protein ELP: 5.7 g/dL — ABNORMAL LOW (ref 6.0–8.5)

## 2021-02-06 LAB — KAPPA/LAMBDA LIGHT CHAINS
Kappa free light chain: 16.4 mg/L (ref 3.3–19.4)
Kappa, lambda light chain ratio: 0.96 (ref 0.26–1.65)
Lambda free light chains: 17 mg/L (ref 5.7–26.3)

## 2021-02-06 NOTE — Progress Notes (Signed)
Location:  [facility] Room Number: 148/W Place of Service:  SNF (31)   CODE STATUS: Full Code  Allergies  Allergen Reactions  . Lotensin [Benazepril]     Chief Complaint  Patient presents with  . Follow-up    ED Follow Up    HPI:  She is a 75 year old long term resident of this facility who was sent to the ED for increased weakness from her cancer appointment. She did not have any fevers; she has denies any urinary pain. She was found to have an abnormal urine was started on keflex.   Past Medical History:  Diagnosis Date  . Benign hypertension   . Cataract   . 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 Diabetes mellitus without complication ([place])   . H/O partial nephrectomy   . Hypokalemia   . Hypothyroidism   . Impaired cognition   . Multiple myeloma (H[facility])    Dr Maylon Peppers, [facility]  . Thyroid disease     Past Surgical History:  Procedure Laterality Date  . ABDOMINAL HYSTERECTOMY    . CRANIOTOMY     for lymphoma  . YAG LASER APPLICATION Right 12/24/3157   Procedure: YAG LASER APPLICATION;  Surgeon: [name], MD;  Location: [facility];  Service: Ophthalmology;  Laterality: Right;    Social History   Socioeconomic History  . Marital status: Single    Spouse name: Not on file  . Number of children: Not on file  . Years of education: Not on file  . Highest education level: Not on file  Occupational History  . Occupation: retired   Tobacco Use  . Smoking status: Never Smoker  . Smokeless tobacco: Never Used  Vaping Use  . Vaping Use: Never used  Substance and Sexual Activity  . Alcohol use: No  . Drug use: No  . Sexual activity: Never  Other Topics Concern  . Not on file  Social History Narrative   Long term resident of [facility]    Social Determinants of Health   Financial Resource Strain: Low Risk   . Difficulty of Paying Living Expenses: Not very hard  Food Insecurity: No Food Insecurity  . Worried About Charity fundraiser in  the Last Year: Never true  . Ran Out of Food in the Last Year: Never true  Transportation Needs: No Transportation Needs  . Lack of Transportation (Medical): No  . Lack of Transportation (Non-Medical): No  Physical Activity: Inactive  . Days of Exercise per Week: 0 days  . Minutes of Exercise per Session: 0 min  Stress: No Stress Concern Present  . Feeling of Stress : Not at all  Social Connections: Moderately Isolated  . Frequency of Communication with Friends and Family: More than three times a week  . Frequency of Social Gatherings with Friends and Family: Once a week  . Attends Religious Services: More than 4 times per year  . Active Member of Clubs or Organizations: No  . Attends Archivist Meetings: Never  . Marital Status: Never married  Intimate Partner Violence: Not on file   Family History  Problem Relation Age of Onset  . Depression Mother   . Diabetes Mother   . Heart disease Father   . Cancer - Prostate Brother   . Cancer Brother   . Cancer Sister       VITAL SIGNS BP (!) 146/60   Pulse 88   Temp 97.8 F (36.6 C)   Ht  '5\' 9"'  (1.753 m)   Wt 115 lb 6.4 oz (52.3 kg)   BMI 17.04 kg/m   Outpatient Encounter Medications as of 02/06/2021  Medication Sig  . acetaminophen (TYLENOL) 500 MG tablet Take 1,000 mg by mouth every 6 (six) hours as needed for mild pain.  Marland Kitchen acyclovir (ZOVIRAX) 400 MG tablet TAKE 1 TABLET BY MOUTH TWICE DAILY  . aspirin EC 81 MG tablet Take 81 mg by mouth daily.  . Calcium Carbonate-Vitamin D (CALCIUM-VITAMIN D3 PO) Take 400 Units by mouth daily in the afternoon.  . cephALEXin (KEFLEX) 500 MG capsule Take 1 capsule (500 mg total) by mouth 4 (four) times daily.  . cholestyramine (QUESTRAN) 4 g packet Take 4 g by mouth 2 (two) times daily between meals.  . Lactobacillus Probiotic TABS Take 2 tablets by mouth daily.  Marland Kitchen levothyroxine (SYNTHROID, LEVOTHROID) 25 MCG tablet Take 25 mcg by mouth daily before breakfast.  .  lipase/protease/amylase (CREON) 36000 UNITS CPEP capsule Take 36,000 Units by mouth 2 (two) times daily. With snacks between meals at 10 am and 8 pm  . lipase/protease/amylase (CREON) 36000 UNITS CPEP capsule Take 72,000 Units by mouth 3 (three) times daily with meals. 8 am, 12 pm, and 6 pm  . loperamide (IMODIUM A-D) 2 MG tablet Take 4 mg by mouth 2 (two) times daily as needed for diarrhea or loose stools.  Marland Kitchen losartan (COZAAR) 50 MG tablet Take 1 tablet (50 mg total) by mouth daily.  . Multiple Vitamins-Minerals (MULTIVITAMIN WOMEN 50+ PO) Take by mouth daily.   . NON FORMULARY Regular diet no dairy products.  . NON FORMULARY Wanderguard [phone] to ankle for safety awareness. Check placement and function qshift. Every Shift Day, Evening, Night  . potassium chloride SA (KLOR-CON) 20 MEQ tablet Take 20 mEq by mouth daily.    Facility-Administered Encounter Medications as of 02/06/2021  Medication  . prochlorperazine (COMPAZINE) tablet 10 mg     SIGNIFICANT DIAGNOSTIC EXAMS   PREVIOUS   08-10-20: t score: -3.758  12-05-20: EKG: sinus bradycardia with first degree AV block pvc pac   12-21-20: KUBThere is a mild ileus noted. Mild to moderate increase feces in the colon. No renal stone is seen  12-25-20: KUB  There is a nonspecific bowel gas pattern without obstruction. No residual ileus is noted. Mild increased feces in the colon. No renal stone is seen.  TODAY  02-05-21: ct of head:  1. No acute intracranial abnormality. Please note if concern for acute infarction MRI would be more sensitive. 2. Similar appearance of bifrontal encephalomalacia consistent with prior infarction/insult  02-05-21: MRI of brain:  No evidence of recent infarction, hemorrhage, or mass. Bifrontal encephalomalacia/gliosis. Additional chronic microvascular ischemic changes. Questioned small left anterior clinoid process calcified meningioma on prior CT imaging is not well evaluated due to size and lack of  contrast.    LABS REVIEWED PREVIOUS    07-20-20: vit B 12: 620  07-23-20: tsh 2.264 07-24-20: wbc 3.9; hgb 9.3; hct 29.3 mcv 105.4 plt 105; glucose 106; bun 25; creat 1.28; k+ 3.6; na++ 142; a 9.2 liver normal albumin 3.5  07-30-20: chol 116; ldl 49; trig 71; hdl 53; hgb a1c 5.1; hep C neg  08-07-20: wbc 3.5; hgb 9.0; hct 27.9; mcv 104.5 plt 112; glucose 86; bun 27; creat 1.22; k+ 4.2; an++ 143; ca 9.0 liver normal albumin 3.2 08-14-20: wbc 2.8; hgb 9.4; hct 29.2; mcv 104.3 plt 101; glucose 71; bun 27; creat 1.41; k+ 4.3; na++ 140; ca 9.0 liver  normal albumin 3.1  08-16-20: urine micro-albumin 9.3 08-21-20: wbc 3.9; hgb 9.5; hct 29.7 mcv 104.2 plt 86; glucose 86; bun 18; creat 1.09; k+ 3.7; na++ 144; ca 8.1; liver normal albumin 3.1  09-18-20: wbc 4.4; hgb 9.6; hct 29.7 mcv 102.4 plt 105; glucose 99; bun 20; creat 1.12; k+ 3.2; na++ 142; ca 8.8 liver normal albumin 3.2  10-16-20: wbc 3.9; hgb 10.2; hct 32.3; mcv 104.9 plt 95; glucose 98; bun 19; creat 1.25; k+ 3.9; na++ 139; ca 8.8 liver normal albumin 3.2  12-04-20: wbc 3.1; hgb 10.9; hct 34.6; mcv 103.0 plt 130; glucose 132; bun 27; creat 1.35; k+ 3.6; na++ 143; ca 9.3 liver normal albumin 3.5  12-25-20: wbc 2.7; hgb 10.1; hct 31.2 mcv 103.0 plt 120; glucose 108; bun 13; creat 1.53; k+ 3.9; na++ 138; ca 8.7 liver normal albumin 3.2 GFR 3 01-01-21: wbc 2.5; hgb 10.6; hct 32.1; mcv 100.6 plt 87; glucose 87; bun 27; creat 1.24; k+ 3.8; na++ 144; ca 9.3 GFR 45; liver normal albumin 3.4   TODAY  02-05-21: wbc 3.9; hgb 10.2; hct 31.6; mcv 102.9 plt 91; glucose 118; bun 26; creat 1.29; k+ 3.8; na++ 138; ca 8.7 GFR43 liver normal albumin 3.0 UA +     Review of Systems  Constitutional: Negative for malaise/fatigue.  Respiratory: Negative for cough and shortness of breath.   Cardiovascular: Negative for chest pain, palpitations and leg swelling.  Gastrointestinal: Negative for abdominal pain, constipation and heartburn.  Musculoskeletal: Negative for back pain,  joint pain and myalgias.  Skin: Negative.   Neurological: Negative for dizziness.  Psychiatric/Behavioral: The patient is not nervous/anxious.     Physical Exam Constitutional:      General: She is not in acute distress.    Appearance: She is underweight and well-nourished. She is not diaphoretic.  Neck:     Thyroid: No thyromegaly.  Cardiovascular:     Rate and Rhythm: Normal rate and regular rhythm.     Pulses: Intact distal pulses.     Heart sounds: Murmur heard.      Comments: 2/6 PP faint  Pulmonary:     Effort: Pulmonary effort is normal. No respiratory distress.     Breath sounds: Normal breath sounds.  Abdominal:     General: Bowel sounds are normal. There is no distension.     Palpations: Abdomen is soft.     Tenderness: There is no abdominal tenderness.  Musculoskeletal:        General: No edema. Normal range of motion.     Cervical back: Neck supple.     Right lower leg: No edema.     Left lower leg: No edema.  Lymphadenopathy:     Cervical: No cervical adenopathy.  Skin:    General: Skin is warm and dry.  Neurological:     Mental Status: She is alert. Mental status is at baseline.  Psychiatric:        Mood and Affect: Mood and affect and mood normal.       ASSESSMENT/ PLAN:  TODAY  1. UTI: will continue keflex pending urine culture results and will monitor   MD is aware of resident's narcotic use and is in agreement with current plan of care. We will attempt to wean resident as appropriate.  Ok Edwards NP [facility]  Contact [phone] Monday through Friday 8am- 5pm  After hours call [phone]

## 2021-02-07 ENCOUNTER — Other Ambulatory Visit (HOSPITAL_COMMUNITY)
Admission: RE | Admit: 2021-02-07 | Discharge: 2021-02-07 | Disposition: A | Discharge status: Home / Self Care | Payer: Medicare PPO | Source: Skilled Nursing Facility | Attending: Adult Health | Admitting: Adult Health

## 2021-02-07 DIAGNOSIS — E119 Type 2 diabetes mellitus without complications: Secondary | ICD-10-CM | POA: Insufficient documentation

## 2021-02-07 DIAGNOSIS — Z9181 History of falling: Secondary | ICD-10-CM | POA: Diagnosis not present

## 2021-02-07 DIAGNOSIS — F0390 Unspecified dementia without behavioral disturbance: Secondary | ICD-10-CM | POA: Diagnosis not present

## 2021-02-07 DIAGNOSIS — R41841 Cognitive communication deficit: Secondary | ICD-10-CM | POA: Diagnosis not present

## 2021-02-07 LAB — HEMOGLOBIN A1C
Hgb A1c MFr Bld: 5.3 % (ref 4.8–5.6)
Mean Plasma Glucose: 105.41 mg/dL

## 2021-02-08 LAB — URINE CULTURE: Culture: >=100000 — AB

## 2021-02-12 DIAGNOSIS — F0390 Unspecified dementia without behavioral disturbance: Secondary | ICD-10-CM | POA: Diagnosis not present

## 2021-02-12 DIAGNOSIS — Z9181 History of falling: Secondary | ICD-10-CM | POA: Diagnosis not present

## 2021-02-12 DIAGNOSIS — R55 Syncope and collapse: Secondary | ICD-10-CM | POA: Diagnosis not present

## 2021-02-12 DIAGNOSIS — R41841 Cognitive communication deficit: Secondary | ICD-10-CM | POA: Diagnosis not present

## 2021-02-13 DIAGNOSIS — Z961 Presence of intraocular lens: Secondary | ICD-10-CM | POA: Diagnosis not present

## 2021-02-13 DIAGNOSIS — H524 Presbyopia: Secondary | ICD-10-CM | POA: Diagnosis not present

## 2021-02-13 DIAGNOSIS — H26492 Other secondary cataract, left eye: Secondary | ICD-10-CM | POA: Diagnosis not present

## 2021-02-13 DIAGNOSIS — E119 Type 2 diabetes mellitus without complications: Secondary | ICD-10-CM | POA: Diagnosis not present

## 2021-02-13 LAB — HM DIABETES EYE EXAM

## 2021-02-15 DIAGNOSIS — R55 Syncope and collapse: Secondary | ICD-10-CM | POA: Diagnosis not present

## 2021-02-15 DIAGNOSIS — R41841 Cognitive communication deficit: Secondary | ICD-10-CM | POA: Diagnosis not present

## 2021-02-15 DIAGNOSIS — F0390 Unspecified dementia without behavioral disturbance: Secondary | ICD-10-CM | POA: Diagnosis not present

## 2021-02-15 DIAGNOSIS — Z9181 History of falling: Secondary | ICD-10-CM | POA: Diagnosis not present

## 2021-02-15 NOTE — Progress Notes (Signed)
Velcade was prepped to dispense to patient but patient's BP dropped and she became very confused. She was then admitted inpatient for further evaluation. Bernita Buffy was advised of spoilage and is processing replacement   Madalyn Rob, CPhT IV Drug Replacement Specialist  [place] Phone: [phone]

## 2021-02-16 DIAGNOSIS — Z9181 History of falling: Secondary | ICD-10-CM | POA: Diagnosis not present

## 2021-02-16 DIAGNOSIS — F0390 Unspecified dementia without behavioral disturbance: Secondary | ICD-10-CM | POA: Diagnosis not present

## 2021-02-16 DIAGNOSIS — R41841 Cognitive communication deficit: Secondary | ICD-10-CM | POA: Diagnosis not present

## 2021-02-16 DIAGNOSIS — R55 Syncope and collapse: Secondary | ICD-10-CM | POA: Diagnosis not present

## 2021-02-18 DIAGNOSIS — R55 Syncope and collapse: Secondary | ICD-10-CM | POA: Diagnosis not present

## 2021-02-18 DIAGNOSIS — F0390 Unspecified dementia without behavioral disturbance: Secondary | ICD-10-CM | POA: Diagnosis not present

## 2021-02-18 DIAGNOSIS — Z9181 History of falling: Secondary | ICD-10-CM | POA: Diagnosis not present

## 2021-02-18 DIAGNOSIS — R41841 Cognitive communication deficit: Secondary | ICD-10-CM | POA: Diagnosis not present

## 2021-02-19 ENCOUNTER — Inpatient Hospital Stay (HOSPITAL_COMMUNITY): Payer: Medicare PPO

## 2021-02-19 ENCOUNTER — Inpatient Hospital Stay (HOSPITAL_COMMUNITY): Payer: Medicare PPO | Attending: Hematology

## 2021-02-19 DIAGNOSIS — C9000 Multiple myeloma not having achieved remission: Secondary | ICD-10-CM | POA: Insufficient documentation

## 2021-02-19 DIAGNOSIS — Z5111 Encounter for antineoplastic chemotherapy: Secondary | ICD-10-CM | POA: Insufficient documentation

## 2021-02-19 DIAGNOSIS — E119 Type 2 diabetes mellitus without complications: Secondary | ICD-10-CM | POA: Insufficient documentation

## 2021-02-19 DIAGNOSIS — I10 Essential (primary) hypertension: Secondary | ICD-10-CM | POA: Insufficient documentation

## 2021-02-19 DIAGNOSIS — E039 Hypothyroidism, unspecified: Secondary | ICD-10-CM | POA: Insufficient documentation

## 2021-02-20 DIAGNOSIS — Z9181 History of falling: Secondary | ICD-10-CM | POA: Diagnosis not present

## 2021-02-20 DIAGNOSIS — R41841 Cognitive communication deficit: Secondary | ICD-10-CM | POA: Diagnosis not present

## 2021-02-20 DIAGNOSIS — F0390 Unspecified dementia without behavioral disturbance: Secondary | ICD-10-CM | POA: Diagnosis not present

## 2021-02-20 DIAGNOSIS — R55 Syncope and collapse: Secondary | ICD-10-CM | POA: Diagnosis not present

## 2021-02-21 DIAGNOSIS — R55 Syncope and collapse: Secondary | ICD-10-CM | POA: Diagnosis not present

## 2021-02-21 DIAGNOSIS — Z9181 History of falling: Secondary | ICD-10-CM | POA: Diagnosis not present

## 2021-02-21 DIAGNOSIS — R41841 Cognitive communication deficit: Secondary | ICD-10-CM | POA: Diagnosis not present

## 2021-02-21 DIAGNOSIS — F0390 Unspecified dementia without behavioral disturbance: Secondary | ICD-10-CM | POA: Diagnosis not present

## 2021-02-22 DIAGNOSIS — Z9181 History of falling: Secondary | ICD-10-CM | POA: Diagnosis not present

## 2021-02-22 DIAGNOSIS — F0390 Unspecified dementia without behavioral disturbance: Secondary | ICD-10-CM | POA: Diagnosis not present

## 2021-02-22 DIAGNOSIS — R55 Syncope and collapse: Secondary | ICD-10-CM | POA: Diagnosis not present

## 2021-02-22 DIAGNOSIS — R41841 Cognitive communication deficit: Secondary | ICD-10-CM | POA: Diagnosis not present

## 2021-02-25 DIAGNOSIS — R55 Syncope and collapse: Secondary | ICD-10-CM | POA: Diagnosis not present

## 2021-02-25 DIAGNOSIS — F0390 Unspecified dementia without behavioral disturbance: Secondary | ICD-10-CM | POA: Diagnosis not present

## 2021-02-25 DIAGNOSIS — R41841 Cognitive communication deficit: Secondary | ICD-10-CM | POA: Diagnosis not present

## 2021-02-25 DIAGNOSIS — Z9181 History of falling: Secondary | ICD-10-CM | POA: Diagnosis not present

## 2021-02-26 ENCOUNTER — Encounter (HOSPITAL_COMMUNITY): Payer: Self-pay

## 2021-02-26 ENCOUNTER — Inpatient Hospital Stay (HOSPITAL_COMMUNITY): Payer: Medicare PPO

## 2021-02-26 VITALS — BP 145/89 | HR 72 | Temp 96.9°F | Resp 16

## 2021-02-26 DIAGNOSIS — R55 Syncope and collapse: Secondary | ICD-10-CM | POA: Diagnosis not present

## 2021-02-26 DIAGNOSIS — E119 Type 2 diabetes mellitus without complications: Secondary | ICD-10-CM | POA: Diagnosis not present

## 2021-02-26 DIAGNOSIS — C9000 Multiple myeloma not having achieved remission: Secondary | ICD-10-CM | POA: Diagnosis not present

## 2021-02-26 DIAGNOSIS — E039 Hypothyroidism, unspecified: Secondary | ICD-10-CM | POA: Diagnosis not present

## 2021-02-26 DIAGNOSIS — R41841 Cognitive communication deficit: Secondary | ICD-10-CM | POA: Diagnosis not present

## 2021-02-26 DIAGNOSIS — Z9181 History of falling: Secondary | ICD-10-CM | POA: Diagnosis not present

## 2021-02-26 DIAGNOSIS — I10 Essential (primary) hypertension: Secondary | ICD-10-CM | POA: Diagnosis not present

## 2021-02-26 DIAGNOSIS — Z5111 Encounter for antineoplastic chemotherapy: Secondary | ICD-10-CM | POA: Diagnosis not present

## 2021-02-26 DIAGNOSIS — F0390 Unspecified dementia without behavioral disturbance: Secondary | ICD-10-CM | POA: Diagnosis not present

## 2021-02-26 LAB — CBC WITH DIFFERENTIAL/PLATELET
Abs Immature Granulocytes: 0.01 10*3/uL (ref 0.00–0.07)
Basophils Absolute: 0 10*3/uL (ref 0.0–0.1)
Basophils Relative: 1 %
Eosinophils Absolute: 0.1 10*3/uL (ref 0.0–0.5)
Eosinophils Relative: 3 %
HCT: 34.4 % — ABNORMAL LOW (ref 36.0–46.0)
Hemoglobin: 10.9 g/dL — ABNORMAL LOW (ref 12.0–15.0)
Immature Granulocytes: 0 %
Lymphocytes Relative: 12 %
Lymphs Abs: 0.5 10*3/uL — ABNORMAL LOW (ref 0.7–4.0)
MCH: 32.7 pg (ref 26.0–34.0)
MCHC: 31.7 g/dL (ref 30.0–36.0)
MCV: 103.3 fL — ABNORMAL HIGH (ref 80.0–100.0)
Monocytes Absolute: 0.3 10*3/uL (ref 0.1–1.0)
Monocytes Relative: 7 %
Neutro Abs: 3.2 10*3/uL (ref 1.7–7.7)
Neutrophils Relative %: 77 %
Platelets: 141 10*3/uL — ABNORMAL LOW (ref 150–400)
RBC: 3.33 MIL/uL — ABNORMAL LOW (ref 3.87–5.11)
RDW: 14.9 % (ref 11.5–15.5)
WBC: 4.2 10*3/uL (ref 4.0–10.5)
nRBC: 0 % (ref 0.0–0.2)

## 2021-02-26 LAB — COMPREHENSIVE METABOLIC PANEL
ALT: 20 U/L (ref 0–44)
AST: 30 U/L (ref 15–41)
Albumin: 3.4 g/dL — ABNORMAL LOW (ref 3.5–5.0)
Alkaline Phosphatase: 66 U/L (ref 38–126)
Anion gap: 10 (ref 5–15)
BUN: 22 mg/dL (ref 8–23)
CO2: 25 mmol/L (ref 22–32)
Calcium: 8.9 mg/dL (ref 8.9–10.3)
Chloride: 103 mmol/L (ref 98–111)
Creatinine, Ser: 1.07 mg/dL — ABNORMAL HIGH (ref 0.44–1.00)
GFR, Estimated: 54 mL/min — ABNORMAL LOW (ref >60)
Glucose, Bld: 132 mg/dL — ABNORMAL HIGH (ref 70–99)
Potassium: 3.8 mmol/L (ref 3.5–5.1)
Sodium: 138 mmol/L (ref 135–145)
Total Bilirubin: 0.6 mg/dL (ref 0.3–1.2)
Total Protein: 6.6 g/dL (ref 6.5–8.1)

## 2021-02-26 MED ORDER — BORTEZOMIB CHEMO SQ INJECTION 3.5 MG (2.5MG/ML)
1.3000 mg/m2 | Freq: Once | INTRAMUSCULAR | Status: AC
  Administered 2021-02-26: 2 mg via SUBCUTANEOUS
  Filled 2021-02-26: qty 0.8

## 2021-02-26 MED ORDER — DEXAMETHASONE 4 MG PO TABS
10.0000 mg | ORAL_TABLET | Freq: Once | ORAL | Status: AC
  Administered 2021-02-26: 10 mg via ORAL
  Filled 2021-02-26: qty 3

## 2021-02-26 NOTE — Patient Instructions (Signed)
[facility]  Discharge Instructions:   _______________________________________________________________  Thank you for choosing [facility] at [facility] to provide your oncology and hematology care.  To afford each patient quality time with our providers, please arrive at least 15 minutes before your scheduled appointment.  You need to re-schedule your appointment if you arrive 10 or more minutes late.  We strive to give you quality time with our providers, and arriving late affects you and other patients whose appointments are after yours.  Also, if you no show three or more times for appointments you may be dismissed from the clinic.  Again, thank you for choosing [facility]. Our hope is that these requests will allow you access to exceptional care and in a timely manner. _______________________________________________________________  If you have questions after your visit, please contact our office at [phone] between the hours of 8:30 a.m. and 5:00 p.m. Voicemails left after 4:30 p.m. will not be returned until the following business day. _______________________________________________________________  For prescription refill requests, have your pharmacy contact our office. _______________________________________________________________  Recommendations made by the consultant and any test results will be sent to your referring physician. _______________________________________________________________ 

## 2021-02-26 NOTE — Progress Notes (Signed)
Verbal order ok to weight patient every other patient per Dr. Delton Coombes.   Patient has dementia.  Family at side and stated the patient has not had any problems with diarrhea.  No s/s of distress.  Patient alert and eating crackers and sips of soda.    Patient tolerated Velcade injection with no complaints voiced.  Lab work reviewed.  See MAR for details.  Injection site clean and dry with no bruising or swelling noted.  Patient stable during and after injection.  Band aid applied.  VSS.  Patient left in satisfactory condition with no s/s of distress noted.

## 2021-02-27 DIAGNOSIS — R55 Syncope and collapse: Secondary | ICD-10-CM | POA: Diagnosis not present

## 2021-02-27 DIAGNOSIS — Z9181 History of falling: Secondary | ICD-10-CM | POA: Diagnosis not present

## 2021-02-27 DIAGNOSIS — R41841 Cognitive communication deficit: Secondary | ICD-10-CM | POA: Diagnosis not present

## 2021-02-27 DIAGNOSIS — F0390 Unspecified dementia without behavioral disturbance: Secondary | ICD-10-CM | POA: Diagnosis not present

## 2021-02-28 DIAGNOSIS — F0390 Unspecified dementia without behavioral disturbance: Secondary | ICD-10-CM | POA: Diagnosis not present

## 2021-02-28 DIAGNOSIS — R55 Syncope and collapse: Secondary | ICD-10-CM | POA: Diagnosis not present

## 2021-02-28 DIAGNOSIS — Z9181 History of falling: Secondary | ICD-10-CM | POA: Diagnosis not present

## 2021-02-28 DIAGNOSIS — R41841 Cognitive communication deficit: Secondary | ICD-10-CM | POA: Diagnosis not present

## 2021-03-01 DIAGNOSIS — R41841 Cognitive communication deficit: Secondary | ICD-10-CM | POA: Diagnosis not present

## 2021-03-01 DIAGNOSIS — R55 Syncope and collapse: Secondary | ICD-10-CM | POA: Diagnosis not present

## 2021-03-01 DIAGNOSIS — Z9181 History of falling: Secondary | ICD-10-CM | POA: Diagnosis not present

## 2021-03-01 DIAGNOSIS — F0390 Unspecified dementia without behavioral disturbance: Secondary | ICD-10-CM | POA: Diagnosis not present

## 2021-03-05 ENCOUNTER — Inpatient Hospital Stay (HOSPITAL_COMMUNITY): Payer: Medicare PPO

## 2021-03-05 ENCOUNTER — Inpatient Hospital Stay (HOSPITAL_BASED_OUTPATIENT_CLINIC_OR_DEPARTMENT_OTHER): Payer: Medicare PPO | Admitting: Hematology

## 2021-03-05 ENCOUNTER — Other Ambulatory Visit: Payer: Self-pay

## 2021-03-05 VITALS — BP 104/65 | HR 81 | Temp 86.8°F | Resp 18 | Wt 113.8 lb

## 2021-03-05 DIAGNOSIS — Z5111 Encounter for antineoplastic chemotherapy: Secondary | ICD-10-CM | POA: Diagnosis not present

## 2021-03-05 DIAGNOSIS — E039 Hypothyroidism, unspecified: Secondary | ICD-10-CM | POA: Diagnosis not present

## 2021-03-05 DIAGNOSIS — C9000 Multiple myeloma not having achieved remission: Secondary | ICD-10-CM

## 2021-03-05 DIAGNOSIS — I10 Essential (primary) hypertension: Secondary | ICD-10-CM | POA: Diagnosis not present

## 2021-03-05 DIAGNOSIS — E119 Type 2 diabetes mellitus without complications: Secondary | ICD-10-CM | POA: Diagnosis not present

## 2021-03-05 LAB — CBC WITH DIFFERENTIAL/PLATELET
Abs Immature Granulocytes: 0.01 10*3/uL (ref 0.00–0.07)
Basophils Absolute: 0 10*3/uL (ref 0.0–0.1)
Basophils Relative: 1 %
Eosinophils Absolute: 0.1 10*3/uL (ref 0.0–0.5)
Eosinophils Relative: 2 %
HCT: 34.3 % — ABNORMAL LOW (ref 36.0–46.0)
Hemoglobin: 11.1 g/dL — ABNORMAL LOW (ref 12.0–15.0)
Immature Granulocytes: 0 %
Lymphocytes Relative: 12 %
Lymphs Abs: 0.6 10*3/uL — ABNORMAL LOW (ref 0.7–4.0)
MCH: 33.2 pg (ref 26.0–34.0)
MCHC: 32.4 g/dL (ref 30.0–36.0)
MCV: 102.7 fL — ABNORMAL HIGH (ref 80.0–100.0)
Monocytes Absolute: 0.5 10*3/uL (ref 0.1–1.0)
Monocytes Relative: 10 %
Neutro Abs: 3.7 10*3/uL (ref 1.7–7.7)
Neutrophils Relative %: 75 %
Platelets: 110 10*3/uL — ABNORMAL LOW (ref 150–400)
RBC: 3.34 MIL/uL — ABNORMAL LOW (ref 3.87–5.11)
RDW: 15.2 % (ref 11.5–15.5)
WBC: 4.9 10*3/uL (ref 4.0–10.5)
nRBC: 0 % (ref 0.0–0.2)

## 2021-03-05 LAB — COMPREHENSIVE METABOLIC PANEL
ALT: 31 U/L (ref 0–44)
AST: 43 U/L — ABNORMAL HIGH (ref 15–41)
Albumin: 3.4 g/dL — ABNORMAL LOW (ref 3.5–5.0)
Alkaline Phosphatase: 69 U/L (ref 38–126)
Anion gap: 9 (ref 5–15)
BUN: 19 mg/dL (ref 8–23)
CO2: 26 mmol/L (ref 22–32)
Calcium: 9 mg/dL (ref 8.9–10.3)
Chloride: 108 mmol/L (ref 98–111)
Creatinine, Ser: 1.27 mg/dL — ABNORMAL HIGH (ref 0.44–1.00)
GFR, Estimated: 44 mL/min — ABNORMAL LOW (ref >60)
Glucose, Bld: 134 mg/dL — ABNORMAL HIGH (ref 70–99)
Potassium: 3.4 mmol/L — ABNORMAL LOW (ref 3.5–5.1)
Sodium: 143 mmol/L (ref 135–145)
Total Bilirubin: 0.5 mg/dL (ref 0.3–1.2)
Total Protein: 6.5 g/dL (ref 6.5–8.1)

## 2021-03-05 LAB — LACTATE DEHYDROGENASE: LDH: 190 U/L (ref 98–192)

## 2021-03-05 MED ORDER — DEXAMETHASONE 4 MG PO TABS
10.0000 mg | ORAL_TABLET | Freq: Once | ORAL | Status: AC
  Administered 2021-03-05: 10 mg via ORAL
  Filled 2021-03-05: qty 3

## 2021-03-05 MED ORDER — BORTEZOMIB CHEMO SQ INJECTION 3.5 MG (2.5MG/ML)
1.3000 mg/m2 | Freq: Once | INTRAMUSCULAR | Status: AC
  Administered 2021-03-05: 2 mg via SUBCUTANEOUS
  Filled 2021-03-05: qty 0.8

## 2021-03-05 NOTE — Progress Notes (Signed)
Patient tolerated Velcade injection with no complaints voiced. Lab work reviewed. See MAR for details. Injection site clean and dry with no bruising or swelling noted. Patient stable during and after injection. Band aid applied. VSS. Patient left in satisfactory condition with no s/s of distress noted. 

## 2021-03-05 NOTE — Patient Instructions (Signed)
[facility] Discharge Instructions  You were seen today by Dr. Delton Coombes. He went over your recent results. You received your treatment today; continue getting your treatment every week. Dr. Delton Coombes will see you back in 2 months for labs and follow up.   Thank you for choosing [place] at [facility] to provide your oncology and hematology care.  To afford each patient quality time with our provider, please arrive at least 15 minutes before your scheduled appointment time.   If you have a lab appointment with the [facility] please come in thru the Main Entrance and check in at the main information desk  You need to re-schedule your appointment should you arrive 10 or more minutes late.  We strive to give you quality time with our providers, and arriving late affects you and other patients whose appointments are after yours.  Also, if you no show three or more times for appointments you may be dismissed from the clinic at the providers discretion.     Again, thank you for choosing [facility].  Our hope is that these requests will decrease the amount of time that you wait before being seen by our physicians.       _____________________________________________________________  Should you have questions after your visit to [facility], please contact our office at [phone] between the hours of 8:00 a.m. and 4:30 p.m.  Voicemails left after 4:00 p.m. will not be returned until the following business day.  For prescription refill requests, have your pharmacy contact our office and allow 72 hours.    Cancer Center Support Programs:   > Cancer Support Group  2nd Tuesday of the month 1pm-2pm, Journey Room

## 2021-03-05 NOTE — Progress Notes (Signed)
[place], [place] [address]   CLINIC:  Medical Oncology/Hematology  PCP:  Gerlene Fee, NP [address] [place] [address] [phone]   REASON FOR VISIT:  Follow-up for multiple myeloma  PRIOR THERAPY: None  NGS Results: Not done  CURRENT THERAPY: Velcade & Decadron 3/4 weeks; Revlimid 2/4 weeks  BRIEF ONCOLOGIC HISTORY:  Oncology History  Multiple myeloma ([facility])  07/09/2017 Initial Diagnosis   Multiple myeloma ([facility])   06/01/2018 -  Chemotherapy    Patient is on Treatment Plan: MYELOMA MAINTENANCE BORTEZOMIB SQ D1,8,15 / DEXAMETHASONE ORAL D1,8,15 Q28D         CANCER STAGING: Cancer Staging No matching staging information was found for the patient.  INTERVAL HISTORY:  Amanda Wu, a 75 y.o. female, returns for routine follow-up and consideration for next cycle of chemotherapy. Amanda Wu was last seen on 02/05/2021.  Due for day #8 of cycle #36 of Velcade today.   Overall, she tells me she has been feeling pretty well. She notes having soft diarrhea at least once a day and is not taking Questran. She stopped drinking Boost and denies having weight loss. Her appetite is excellent. She denies having any numbness or tingling in her hands or feet and denies having any cough.  Overall, she feels ready for next cycle of chemo today.    REVIEW OF SYSTEMS:  Review of Systems  Constitutional: Negative for appetite change, fatigue and unexpected weight change.  Respiratory: Negative for cough.   Gastrointestinal: Positive for diarrhea (soft diarrhea daily).  Neurological: Negative for numbness.  All other systems reviewed and are negative.   PAST MEDICAL/SURGICAL HISTORY:  Past Medical History:  Diagnosis Date  . Benign hypertension   . Cataract   . 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 Diabetes mellitus without complication ([facility])   . H/O partial nephrectomy   . Hypokalemia   . Hypothyroidism   .  Impaired cognition   . Multiple myeloma ([facility])    Dr Maylon Peppers, [facility]  . Thyroid disease    Past Surgical History:  Procedure Laterality Date  . ABDOMINAL HYSTERECTOMY    . CRANIOTOMY     for lymphoma  . YAG LASER APPLICATION Right 05/01/6159   Procedure: YAG LASER APPLICATION;  Surgeon: [name], MD;  Location: [facility];  Service: Ophthalmology;  Laterality: Right;    SOCIAL HISTORY:  Social History   Socioeconomic History  . Marital status: Single    Spouse name: Not on file  . Number of children: Not on file  . Years of education: Not on file  . Highest education level: Not on file  Occupational History  . Occupation: retired   Tobacco Use  . Smoking status: Never Smoker  . Smokeless tobacco: Never Used  Vaping Use  . Vaping Use: Never used  Substance and Sexual Activity  . Alcohol use: No  . Drug use: No  . Sexual activity: Never  Other Topics Concern  . Not on file  Social History Narrative   [facility] resident of [facility]    Social Determinants of Health   Financial Resource Strain: Low Risk   . Difficulty of Paying Living Expenses: Not very hard  Food Insecurity: No Food Insecurity  . Worried About Charity fundraiser in the Last Year: Never true  . Ran Out of Food in the Last Year: Never true  Transportation Needs: No Transportation Needs  . Lack of Transportation (  Medical): No  . Lack of Transportation (Non-Medical): No  Physical Activity: Inactive  . Days of Exercise per Week: 0 days  . Minutes of Exercise per Session: 0 min  Stress: No Stress Concern Present  . Feeling of Stress : Not at all  Social Connections: Moderately Isolated  . Frequency of Communication with Friends and Family: More than three times a week  . Frequency of Social Gatherings with Friends and Family: Once a week  . Attends Religious Services: More than 4 times per year  . Active Member of Clubs or Organizations: No  . Attends Archivist Meetings: Never  . Marital  Status: Never married  Human resources officer Violence: Not on file    FAMILY HISTORY:  Family History  Problem Relation Age of Onset  . Depression Mother   . Diabetes Mother   . Heart disease Father   . Cancer - Prostate Brother   . Cancer Brother   . Cancer Sister     CURRENT MEDICATIONS:  No current facility-administered medications for this visit.   No current outpatient medications on file.   Facility-Administered Medications Ordered in Other Visits  Medication Dose Route Frequency Provider Last Rate Last Admin  . prochlorperazine (COMPAZINE) tablet 10 mg  10 mg Oral Once Derek Jack, MD        ALLERGIES:  Allergies  Allergen Reactions  . Lotensin [Benazepril]     PHYSICAL EXAM:  Performance status (ECOG): 2 - Symptomatic, <50% confined to bed  Vitals:   03/05/21 1028  BP: 104/65  Pulse: 81  Resp: 18  Temp: (!) 86.8 F (30.4 C)  SpO2: 98%   Wt Readings from Last 3 Encounters:  03/05/21 113 lb 12.1 oz (51.6 kg)  02/06/21 115 lb 6.4 oz (52.3 kg)  02/05/21 110 lb (49.9 kg)   Physical Exam Vitals reviewed.  Constitutional:      Appearance: Normal appearance.     Comments: Wheelchair-bound  Cardiovascular:     Rate and Rhythm: Normal rate and regular rhythm.     Pulses: Normal pulses.     Heart sounds: Normal heart sounds.  Pulmonary:     Effort: Pulmonary effort is normal.     Breath sounds: Normal breath sounds.  Musculoskeletal:     Right lower leg: No edema.     Left lower leg: No edema.  Neurological:     General: No focal deficit present.     Mental Status: She is alert and oriented to person, place, and time.  Psychiatric:        Mood and Affect: Mood normal.        Behavior: Behavior normal.     LABORATORY DATA:  I have reviewed the labs as listed.  CBC Latest Ref Rng & Units 03/05/2021 02/26/2021 02/05/2021  WBC 4.0 - 10.5 K/uL 4.9 4.2 3.9(L)  Hemoglobin 12.0 - 15.0 g/dL 11.1(L) 10.9(L) 10.2(L)  Hematocrit 36.0 - 46.0 % 34.3(L)  34.4(L) 31.6(L)  Platelets 150 - 400 K/uL 110(L) 141(L) 91(L)   CMP Latest Ref Rng & Units 02/26/2021 02/05/2021 02/05/2021  Glucose 70 - 99 mg/dL 132(H) 118(H) 150(H)  BUN 8 - 23 mg/dL 22 26(H) 24(H)  Creatinine 0.44 - 1.00 mg/dL 1.07(H) 1.29(H) 1.16(H)  Sodium 135 - 145 mmol/L 138 138 139  Potassium 3.5 - 5.1 mmol/L 3.8 3.8 3.7  Chloride 98 - 111 mmol/L 103 107 107  CO2 22 - 32 mmol/L '25 27 25  ' Calcium 8.9 - 10.3 mg/dL 8.9 8.7(L) 8.9  Total  Protein 6.5 - 8.1 g/dL 6.6 6.0(L) 5.8(L)  Total Bilirubin 0.3 - 1.2 mg/dL 0.6 0.4 0.6  Alkaline Phos 38 - 126 U/L 66 56 58  AST 15 - 41 U/L '30 30 31  ' ALT 0 - 44 U/L '20 26 27    ' DIAGNOSTIC IMAGING:  I have independently reviewed the scans and discussed with the patient. CT HEAD WO CONTRAST  Result Date: 02/05/2021 CLINICAL DATA:  Focal neuro deficit stroke suspected EXAM: CT HEAD WITHOUT CONTRAST TECHNIQUE: Contiguous axial images were obtained from the base of the skull through the vertex without intravenous contrast. COMPARISON:  Head CT May 03, 2020 FINDINGS: Brain: Similar appearance of bifrontal encephalomalacia consistent with prior infarction/insult. No evidence of acute large vascular territory infarction. No evidence of acute intracranial hemorrhage or extra-axial fluid collection. Vascular: No hyperdense vessel or unexpected calcification. Skull: Status post left frontal craniotomy. Lucencies are again noted throughout the calvarium which may be related to history of multiple myeloma. Sinuses/Orbits: No acute finding. Other: None. IMPRESSION: 1. No acute intracranial abnormality. Please note if concern for acute infarction MRI would be more sensitive. 2. Similar appearance of bifrontal encephalomalacia consistent with prior infarction/insult. Electronically Signed   By: Dahlia Bailiff MD   On: 02/05/2021 14:59   MR BRAIN WO CONTRAST  Result Date: 02/05/2021 CLINICAL DATA:  Mental status change EXAM: MRI HEAD WITHOUT CONTRAST TECHNIQUE:  Multiplanar, multiecho pulse sequences of the brain and surrounding structures were obtained without intravenous contrast. COMPARISON:  None. FINDINGS: Brain: There is no acute infarction or intracranial hemorrhage. Bifrontal encephalomalacia/gliosis primarily involving superior gyri. Additional patchy T2 hyperintensity in the supratentorial and pontine white matter is nonspecific but probably reflects chronic microvascular ischemic changes. No intracranial mass identified. A small calcified meningioma along the left anterior clinoid process has been questioned on prior CT imaging. This is not well evaluated on this noncontrast study due to small size. There is no hydrocephalus or extra-axial fluid collection. Prominence of the ventricles and sulci reflects generalized parenchymal volume loss superimposed on ex vacuo dilatation related to above. Vascular: Major vessel flow voids at the skull base are preserved. Skull and upper cervical spine: Bifrontal craniotomy. No aggressive osseous lesion. Sinuses/Orbits: Paranasal sinuses are aerated. Bilateral lens replacements. Other: Sella is unremarkable.  Mastoid air cells are clear. IMPRESSION: No evidence of recent infarction, hemorrhage, or mass. Bifrontal encephalomalacia/gliosis. Additional chronic microvascular ischemic changes. Questioned small left anterior clinoid process calcified meningioma on prior CT imaging is not well evaluated due to size and lack of contrast. Electronically Signed   By: Macy Mis M.D.   On: 02/05/2021 16:56     ASSESSMENT:  1. IgG lambda multiple myeloma: -She is on Revlimid 10 mg 2 weeks on/2 weeks of along with Velcade 3 weeks on/1 week off. Dexamethasone 10 mg on days of Velcade. -Myeloma panel on 04/17/2020 shows M spike 0.5 g. Kappa light chains are 44. Lambda light chains are 34. Ratio is 1.3. -Resident of [facility] since 07/19/2020.  2. Dementia: -This is from whole brain RT from CNS lymphoma several years  ago.   PLAN:  1. IgG lambda multiple myeloma: -Revlimid was discontinued secondary to diarrhea. -She is reporting about 1 loose bowel movement per day. -She is feeling much better.  Reviewed labs today which showed hemoglobin 11.1 with normal white count.  Mild thrombocytopenia 110.  Mild elevated AST of 43. -Reviewed myeloma panel from 02/05/2021 which showed M spike of 0.4 g.  Immunofixation shows IgG lambda.  Free light chain  ratio is 0.96. -We will continue Velcade day 1 and day 8 and 15 every 28 days along with dexamethasone 10 mg on the days of Velcade. -RTC 2 months for follow-up with repeat myeloma labs.  2. CKD: -Her creatinine today is 1.27 and stable.  3. Weight loss: -She gained about 5 pounds in the last 4 to 6 weeks.  4. Dementia: -Continue Aricept 10 mg daily.  5. Shingles prophylaxis: -Continue acyclovir 400 mg twice daily.  6. Myeloma bone disease: -She was treated with Zometa for several years which was discontinued.  7. Macrocytic anemia: -This is from myelosuppression from treatments and CKD.  Hemoglobin is 11.1.  8.Diarrhea: -She is not taking Questran.  GI work-up was negative.  She apparently has 1 loose bowel movement per day.   Orders placed this encounter:  No orders of the defined types were placed in this encounter.    Derek Jack, MD [place] [phone]   I, Milinda Antis, am acting as a scribe for Dr. Sanda Linger.  I, Derek Jack MD, have reviewed the above documentation for accuracy and completeness, and I agree with the above.

## 2021-03-05 NOTE — Progress Notes (Signed)
Patient was assessed by Dr. Katragadda and labs have been reviewed.  Patient is okay to proceed with treatment today. Primary RN and pharmacy aware.   

## 2021-03-05 NOTE — Patient Instructions (Signed)
[facility]  Discharge Instructions:   _______________________________________________________________  Thank you for choosing [facility] to provide your oncology and hematology care.  To afford each patient quality time with our providers, please arrive at least 15 minutes before your scheduled appointment.  You need to re-schedule your appointment if you arrive 10 or more minutes late.  We strive to give you quality time with our providers, and arriving late affects you and other patients whose appointments are after yours.  Also, if you no show three or more times for appointments you may be dismissed from the clinic.  Again, thank you for choosing [name]. Our hope is that these requests will allow you access to exceptional care and in a timely manner. _______________________________________________________________  If you have questions after your visit, please contact our office at [phone] between the hours of 8:30 a.m. and 5:00 p.m. Voicemails left after 4:30 p.m. will not be returned until the following business day. _______________________________________________________________  For prescription refill requests, have your pharmacy contact our office. _______________________________________________________________  Recommendations made by the consultant and any test results will be sent to your referring physician. _______________________________________________________________ 

## 2021-03-06 LAB — KAPPA/LAMBDA LIGHT CHAINS
Kappa free light chain: 18.6 mg/L (ref 3.3–19.4)
Kappa, lambda light chain ratio: 0.9 (ref 0.26–1.65)
Lambda free light chains: 20.7 mg/L (ref 5.7–26.3)

## 2021-03-08 LAB — PROTEIN ELECTROPHORESIS, SERUM
A/G Ratio: 1.3 (ref 0.7–1.7)
Albumin ELP: 3.4 g/dL (ref 2.9–4.4)
Alpha-1-Globulin: 0.2 g/dL (ref 0.0–0.4)
Alpha-2-Globulin: 0.7 g/dL (ref 0.4–1.0)
Beta Globulin: 0.7 g/dL (ref 0.7–1.3)
Gamma Globulin: 1 g/dL (ref 0.4–1.8)
Globulin, Total: 2.7 g/dL (ref 2.2–3.9)
M-Spike, %: 0.5 g/dL — ABNORMAL HIGH
Total Protein ELP: 6.1 g/dL (ref 6.0–8.5)

## 2021-03-11 ENCOUNTER — Other Ambulatory Visit (HOSPITAL_COMMUNITY): Payer: Self-pay

## 2021-03-11 DIAGNOSIS — C9000 Multiple myeloma not having achieved remission: Secondary | ICD-10-CM

## 2021-03-12 ENCOUNTER — Inpatient Hospital Stay (HOSPITAL_COMMUNITY): Payer: Medicare PPO

## 2021-03-12 ENCOUNTER — Encounter (HOSPITAL_COMMUNITY): Payer: Self-pay

## 2021-03-12 VITALS — BP 135/77 | HR 92 | Temp 96.8°F | Resp 17 | Wt 116.4 lb

## 2021-03-12 DIAGNOSIS — E039 Hypothyroidism, unspecified: Secondary | ICD-10-CM | POA: Diagnosis not present

## 2021-03-12 DIAGNOSIS — C9000 Multiple myeloma not having achieved remission: Secondary | ICD-10-CM

## 2021-03-12 DIAGNOSIS — Z5111 Encounter for antineoplastic chemotherapy: Secondary | ICD-10-CM | POA: Diagnosis not present

## 2021-03-12 DIAGNOSIS — E119 Type 2 diabetes mellitus without complications: Secondary | ICD-10-CM | POA: Diagnosis not present

## 2021-03-12 DIAGNOSIS — I10 Essential (primary) hypertension: Secondary | ICD-10-CM | POA: Diagnosis not present

## 2021-03-12 LAB — CBC WITH DIFFERENTIAL/PLATELET
Abs Immature Granulocytes: 0.01 10*3/uL (ref 0.00–0.07)
Basophils Absolute: 0 10*3/uL (ref 0.0–0.1)
Basophils Relative: 1 %
Eosinophils Absolute: 0.1 10*3/uL (ref 0.0–0.5)
Eosinophils Relative: 2 %
HCT: 36 % (ref 36.0–46.0)
Hemoglobin: 11.7 g/dL — ABNORMAL LOW (ref 12.0–15.0)
Immature Granulocytes: 0 %
Lymphocytes Relative: 13 %
Lymphs Abs: 0.7 10*3/uL (ref 0.7–4.0)
MCH: 33.2 pg (ref 26.0–34.0)
MCHC: 32.5 g/dL (ref 30.0–36.0)
MCV: 102.3 fL — ABNORMAL HIGH (ref 80.0–100.0)
Monocytes Absolute: 0.6 10*3/uL (ref 0.1–1.0)
Monocytes Relative: 11 %
Neutro Abs: 3.8 10*3/uL (ref 1.7–7.7)
Neutrophils Relative %: 73 %
Platelets: 121 10*3/uL — ABNORMAL LOW (ref 150–400)
RBC: 3.52 MIL/uL — ABNORMAL LOW (ref 3.87–5.11)
RDW: 14.7 % (ref 11.5–15.5)
WBC: 5.2 10*3/uL (ref 4.0–10.5)
nRBC: 0 % (ref 0.0–0.2)

## 2021-03-12 LAB — COMPREHENSIVE METABOLIC PANEL
ALT: 26 U/L (ref 0–44)
AST: 34 U/L (ref 15–41)
Albumin: 3.6 g/dL (ref 3.5–5.0)
Alkaline Phosphatase: 65 U/L (ref 38–126)
Anion gap: 10 (ref 5–15)
BUN: 21 mg/dL (ref 8–23)
CO2: 27 mmol/L (ref 22–32)
Calcium: 9.1 mg/dL (ref 8.9–10.3)
Chloride: 105 mmol/L (ref 98–111)
Creatinine, Ser: 1.05 mg/dL — ABNORMAL HIGH (ref 0.44–1.00)
GFR, Estimated: 55 mL/min — ABNORMAL LOW (ref >60)
Glucose, Bld: 110 mg/dL — ABNORMAL HIGH (ref 70–99)
Potassium: 3.4 mmol/L — ABNORMAL LOW (ref 3.5–5.1)
Sodium: 142 mmol/L (ref 135–145)
Total Bilirubin: 0.4 mg/dL (ref 0.3–1.2)
Total Protein: 6.9 g/dL (ref 6.5–8.1)

## 2021-03-12 MED ORDER — DEXAMETHASONE 4 MG PO TABS
10.0000 mg | ORAL_TABLET | Freq: Once | ORAL | Status: AC
  Administered 2021-03-12: 10 mg via ORAL
  Filled 2021-03-12: qty 3

## 2021-03-12 MED ORDER — BORTEZOMIB CHEMO SQ INJECTION 3.5 MG (2.5MG/ML)
1.3000 mg/m2 | Freq: Once | INTRAMUSCULAR | Status: AC
  Administered 2021-03-12: 2 mg via SUBCUTANEOUS
  Filled 2021-03-12: qty 0.8

## 2021-03-12 NOTE — Progress Notes (Signed)
Patient tolerated Velcade injection with no complaints voiced. Lab work reviewed. See MAR for details. Injection site clean and dry with no bruising or swelling noted. Patient stable during and after injection. Band aid applied. VSS. Patient left in satisfactory condition with no s/s of distress noted. 

## 2021-03-12 NOTE — Patient Instructions (Signed)
[facility] Discharge Instructions for Patients Receiving Chemotherapy  Today you received the following chemotherapy agents Velcade today.    If you develop nausea and vomiting that is not controlled by your nausea medication, call the clinic.   BELOW ARE SYMPTOMS THAT SHOULD BE REPORTED IMMEDIATELY:  *FEVER GREATER THAN 100.5 F  *CHILLS WITH OR WITHOUT FEVER  NAUSEA AND VOMITING THAT IS NOT CONTROLLED WITH YOUR NAUSEA MEDICATION  *UNUSUAL SHORTNESS OF BREATH  *UNUSUAL BRUISING OR BLEEDING  TENDERNESS IN MOUTH AND THROAT WITH OR WITHOUT PRESENCE OF ULCERS  *URINARY PROBLEMS  *BOWEL PROBLEMS  UNUSUAL RASH Items with * indicate a potential emergency and should be followed up as soon as possible.  Feel free to call the clinic should you have any questions or concerns. The clinic phone number is [phone].  Please show the [facility] at check-in to the Emergency Department and triage nurse.

## 2021-03-13 ENCOUNTER — Non-Acute Institutional Stay (SKILLED_NURSING_FACILITY): Payer: Medicare PPO | Admitting: Adult Health

## 2021-03-13 ENCOUNTER — Encounter: Payer: Self-pay | Admitting: Adult Health

## 2021-03-13 DIAGNOSIS — E039 Hypothyroidism, unspecified: Secondary | ICD-10-CM

## 2021-03-13 DIAGNOSIS — E876 Hypokalemia: Secondary | ICD-10-CM

## 2021-03-13 DIAGNOSIS — R627 Adult failure to thrive: Secondary | ICD-10-CM

## 2021-03-13 NOTE — Progress Notes (Signed)
Location:  [place] Room Number: 148/W Place of Service:  SNF (31)   CODE STATUS: Full Code  Allergies  Allergen Reactions  . Lotensin [Benazepril]     Chief Complaint  Patient presents with  . Medical Management of Chronic Issues         Failure to thrive in adult:   Hypokalemia:  Hypothyroidism, unspecified type     HPI:  She is a 75 year old long term resident of this facility being seen for the management of her chronic illnesses: Failure to thrive in adult:   Hypokalemia:  Hypothyroidism, unspecified type.  There are no reports of uncontrolled pain. She does have periods of time of anxiety and restlessness. There are no reports of changes in appetite. Her weight has increased to 119 pounds.   Past Medical History:  Diagnosis Date  . Benign hypertension   . Cataract   . 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 Diabetes mellitus without complication ([place])   . H/O partial nephrectomy   . Hypokalemia   . Hypothyroidism   . Impaired cognition   . Multiple myeloma ([facility])    Dr Maylon Peppers, [facility]  . Thyroid disease     Past Surgical History:  Procedure Laterality Date  . ABDOMINAL HYSTERECTOMY    . CRANIOTOMY     for lymphoma  . YAG LASER APPLICATION Right [id number]/[id number]   Procedure: YAG LASER APPLICATION;  Surgeon: [name], MD;  Location: [facility];  Service: Ophthalmology;  Laterality: Right;    Social History   Socioeconomic History  . Marital status: Single    Spouse name: Not on file  . Number of children: Not on file  . Years of education: Not on file  . Highest education level: Not on file  Occupational History  . Occupation: retired   Tobacco Use  . Smoking status: Never Smoker  . Smokeless tobacco: Never Used  Vaping Use  . Vaping Use: Never used  Substance and Sexual Activity  . Alcohol use: No  . Drug use: No  . Sexual activity: Never  Other Topics Concern  . Not on file  Social History Narrative   Long term resident  of [facility]    Social Determinants of Health   Financial Resource Strain: Low Risk   . Difficulty of Paying Living Expenses: Not very hard  Food Insecurity: No Food Insecurity  . Worried About Charity fundraiser in the Last Year: Never true  . Ran Out of Food in the Last Year: Never true  Transportation Needs: No Transportation Needs  . Lack of Transportation (Medical): No  . Lack of Transportation (Non-Medical): No  Physical Activity: Inactive  . Days of Exercise per Week: 0 days  . Minutes of Exercise per Session: 0 min  Stress: No Stress Concern Present  . Feeling of Stress : Not at all  Social Connections: Moderately Isolated  . Frequency of Communication with Friends and Family: More than three times a week  . Frequency of Social Gatherings with Friends and Family: Once a week  . Attends Religious Services: More than 4 times per year  . Active Member of Clubs or Organizations: No  . Attends Archivist Meetings: Never  . Marital Status: Never married  Intimate Partner Violence: Not on file   Family History  Problem Relation Age of Onset  . Depression Mother   . Diabetes Mother   . Heart disease Father   . Cancer - Prostate  Brother   . Cancer Brother   . Cancer Sister       VITAL SIGNS BP (!) 152/80   Pulse 73   Temp 98.2 F (36.8 C)   Ht '5\' 9"'  (1.753 m)   Wt 119 lb 3.2 oz (54.1 kg)   BMI 17.60 kg/m   Facility-Administered Encounter Medications as of 03/13/2021  Medication  . prochlorperazine (COMPAZINE) tablet 10 mg   Outpatient Encounter Medications as of 03/13/2021  Medication Sig  . acetaminophen (TYLENOL) 500 MG tablet Take 1,000 mg by mouth every 6 (six) hours as needed for mild pain.  Marland Kitchen acyclovir (ZOVIRAX) 400 MG tablet TAKE 1 TABLET BY MOUTH TWICE DAILY  . aspirin EC 81 MG tablet Take 81 mg by mouth daily.  . Calcium Carbonate-Vitamin D (CALCIUM-VITAMIN D3 PO) Take 400 Units by mouth daily in the afternoon.  . cholestyramine (QUESTRAN) 4 g  packet Take 4 g by mouth 2 (two) times daily between meals.  . Lactobacillus Probiotic TABS Take 2 tablets by mouth daily.  Marland Kitchen levothyroxine (SYNTHROID, LEVOTHROID) 25 MCG tablet Take 25 mcg by mouth daily before breakfast.  . lipase/protease/amylase (CREON) 36000 UNITS CPEP capsule Take 36,000 Units by mouth 2 (two) times daily. With snacks between meals at 10 am and 8 pm  . lipase/protease/amylase (CREON) 36000 UNITS CPEP capsule Take 72,000 Units by mouth 3 (three) times daily with meals. 8 am, 12 pm, and 6 pm  . loperamide (IMODIUM A-D) 2 MG tablet Take 4 mg by mouth 2 (two) times daily as needed for diarrhea or loose stools.  Marland Kitchen losartan (COZAAR) 50 MG tablet Take 1 tablet (50 mg total) by mouth daily.  . Multiple Vitamins-Minerals (MULTIVITAMIN WOMEN 50+ PO) Take by mouth daily.   . NON FORMULARY Regular diet no dairy products.  . NON FORMULARY Wanderguard [phone] to ankle for safety awareness. Check placement and function qshift. Every Shift Day, Evening, Night  . potassium chloride SA (KLOR-CON) 20 MEQ tablet Take 20 mEq by mouth daily.   . [DISCONTINUED] cephALEXin (KEFLEX) 500 MG capsule Take 1 capsule (500 mg total) by mouth 4 (four) times daily.     SIGNIFICANT DIAGNOSTIC EXAMS   PREVIOUS   08-10-20: t score: -3.758  12-21-20: KUBThere is a mild ileus noted. Mild to moderate increase feces in the colon. No renal stone is seen  12-25-20: KUB  There is a nonspecific bowel gas pattern without obstruction. No residual ileus is noted. Mild increased feces in the colon. No renal stone is seen.  02-05-21: ct of head:  1. No acute intracranial abnormality. Please note if concern for acute infarction MRI would be more sensitive. 2. Similar appearance of bifrontal encephalomalacia consistent with prior infarction/insult  02-05-21: MRI of brain:  No evidence of recent infarction, hemorrhage, or mass. Bifrontal encephalomalacia/gliosis. Additional chronic microvascular ischemic  changes. Questioned small left anterior clinoid process calcified meningioma on prior CT imaging is not well evaluated due to size and lack of contrast.  NO NEW EXAMS.     LABS REVIEWED PREVIOUS    07-20-20: vit B 12: 620  07-23-20: tsh 2.264 07-24-20: wbc 3.9; hgb 9.3; hct 29.3 mcv 105.4 plt 105; glucose 106; bun 25; creat 1.28; k+ 3.6; na++ 142; a 9.2 liver normal albumin 3.5  07-30-20: chol 116; ldl 49; trig 71; hdl 53; hgb a1c 5.1; hep C neg  08-07-20: wbc 3.5; hgb 9.0; hct 27.9; mcv 104.5 plt 112; glucose 86; bun 27; creat 1.22; k+ 4.2; an++ 143; ca 9.0 liver  normal albumin 3.2 08-14-20: wbc 2.8; hgb 9.4; hct 29.2; mcv 104.3 plt 101; glucose 71; bun 27; creat 1.41; k+ 4.3; na++ 140; ca 9.0 liver normal albumin 3.1  08-16-20: urine micro-albumin 9.3 08-21-20: wbc 3.9; hgb 9.5; hct 29.7 mcv 104.2 plt 86; glucose 86; bun 18; creat 1.09; k+ 3.7; na++ 144; ca 8.1; liver normal albumin 3.1  09-18-20: wbc 4.4; hgb 9.6; hct 29.7 mcv 102.4 plt 105; glucose 99; bun 20; creat 1.12; k+ 3.2; na++ 142; ca 8.8 liver normal albumin 3.2  10-16-20: wbc 3.9; hgb 10.2; hct 32.3; mcv 104.9 plt 95; glucose 98; bun 19; creat 1.25; k+ 3.9; na++ 139; ca 8.8 liver normal albumin 3.2  12-04-20: wbc 3.1; hgb 10.9; hct 34.6; mcv 103.0 plt 130; glucose 132; bun 27; creat 1.35; k+ 3.6; na++ 143; ca 9.3 liver normal albumin 3.5  12-25-20: wbc 2.7; hgb 10.1; hct 31.2 mcv 103.0 plt 120; glucose 108; bun 13; creat 1.53; k+ 3.9; na++ 138; ca 8.7 liver normal albumin 3.2 GFR 3 01-01-21: wbc 2.5; hgb 10.6; hct 32.1; mcv 100.6 plt 87; glucose 87; bun 27; creat 1.24; k+ 3.8; na++ 144; ca 9.3 GFR 45; liver normal albumin 3.4   TODAY  02-05-21: wbc 3.9; hgb 10.2; hct 31.6; mcv 102.9 plt 91; glucose 118; bun 26; creat 1.29; k+ 3.8; na++ 138; ca 8.7 GFR43 liver normal albumin 3.0 UA +     Review of Systems  Constitutional: Negative for malaise/fatigue.  Respiratory: Negative for cough and shortness of breath.   Cardiovascular: Negative for  chest pain, palpitations and leg swelling.  Gastrointestinal: Negative for abdominal pain, constipation and heartburn.  Musculoskeletal: Negative for back pain, joint pain and myalgias.  Skin: Negative.   Neurological: Negative for dizziness.  Psychiatric/Behavioral: The patient is not nervous/anxious.    Physical Exam Constitutional:      General: She is not in acute distress.    Appearance: She is underweight. She is not diaphoretic.  Neck:     Thyroid: No thyromegaly.  Cardiovascular:     Rate and Rhythm: Normal rate and regular rhythm.     Heart sounds: Murmur heard.      Comments: 2/6 PP faint  Pulmonary:     Effort: Pulmonary effort is normal. No respiratory distress.     Breath sounds: Normal breath sounds.  Abdominal:     General: Bowel sounds are normal. There is no distension.     Palpations: Abdomen is soft.     Tenderness: There is no abdominal tenderness.  Musculoskeletal:        General: Normal range of motion.     Cervical back: Neck supple.     Right lower leg: No edema.     Left lower leg: No edema.  Lymphadenopathy:     Cervical: No cervical adenopathy.  Skin:    General: Skin is warm and dry.  Neurological:     Mental Status: She is alert. Mental status is at baseline.  Psychiatric:        Mood and Affect: Mood normal.   '   ASSESSMENT/ PLAN:   TODAY   1. Failure to thrive in adult: is stable at this time her weight is stable at 119 pounds will continue to monitor her status.   2. Hypokalemia: k+ 3.8 will continue k+ 20 meq daily and will monitor  3. Hypothyroidism, unspecified type: is stable tsh 2.264 will continue synthroid 25 mcg daily    PREVIOUS   4. Hypertension associated with type  2 diabetes mellitus: is stable b/p 152/80 will continue cozaar 50 mg daily and asa 81 mg daily   5. Diabetes mellitus without complication: is stable hgb a1c 5.1 will monitor is on arb and asa  6. Post menopausal osteoporosis: t score -3.758 will  continue calcium and vit D   7. Compression fracture of L1 vertebrae with routine healing subsequent encounter (06/2017) is stable has prn tylenol   8. Thrombocytopenia: is stable plt 105 will monitor   9. Multiple myeloma without achieving remission: is without change: is followed by oncology will ocnintue revlimid 100 mg on 14 days and off 14 days. Will monitor   10. Aortic atherosclerosis: will monitor   11. First degree av block/bradycardia: is stable has been seen by cardiology will monitor   12. Pancreatic insuffiencey: is slowly improving: will continue questran 4 gm twice daily; will continue creon 72,000 units with meals and imodium 2 mg three times daily  13. Macrocytic anemia is stable hgb 9.6 vit B 12: 620; is off iron will monitor   14. Vascular dementia with behavioral disturbance: is stable weight is 115 pounds her aricept was stopped due to side effects.   15. Herpes: no recent outbreaks; will continue acyclovir 400 mg twice daily      Ok Edwards NP [facility]  Contact [phone] Monday through Friday 8am- 5pm  After hours call [phone]

## 2021-03-14 ENCOUNTER — Other Ambulatory Visit (HOSPITAL_COMMUNITY)
Admission: RE | Admit: 2021-03-14 | Discharge: 2021-03-14 | Disposition: A | Discharge status: Home / Self Care | Payer: Medicare PPO | Source: Skilled Nursing Facility | Attending: Adult Health | Admitting: Adult Health

## 2021-03-14 DIAGNOSIS — C9000 Multiple myeloma not having achieved remission: Secondary | ICD-10-CM | POA: Insufficient documentation

## 2021-03-14 LAB — CBC WITH DIFFERENTIAL/PLATELET
Abs Immature Granulocytes: 0.02 10*3/uL (ref 0.00–0.07)
Basophils Absolute: 0 10*3/uL (ref 0.0–0.1)
Basophils Relative: 0 %
Eosinophils Absolute: 0 10*3/uL (ref 0.0–0.5)
Eosinophils Relative: 1 %
HCT: 36.5 % (ref 36.0–46.0)
Hemoglobin: 11.5 g/dL — ABNORMAL LOW (ref 12.0–15.0)
Immature Granulocytes: 0 %
Lymphocytes Relative: 9 %
Lymphs Abs: 0.5 10*3/uL — ABNORMAL LOW (ref 0.7–4.0)
MCH: 32.8 pg (ref 26.0–34.0)
MCHC: 31.5 g/dL (ref 30.0–36.0)
MCV: 104 fL — ABNORMAL HIGH (ref 80.0–100.0)
Monocytes Absolute: 0.7 10*3/uL (ref 0.1–1.0)
Monocytes Relative: 13 %
Neutro Abs: 4.4 10*3/uL (ref 1.7–7.7)
Neutrophils Relative %: 77 %
Platelets: 104 10*3/uL — ABNORMAL LOW (ref 150–400)
RBC: 3.51 MIL/uL — ABNORMAL LOW (ref 3.87–5.11)
RDW: 15.1 % (ref 11.5–15.5)
WBC: 5.6 10*3/uL (ref 4.0–10.5)
nRBC: 0 % (ref 0.0–0.2)

## 2021-03-14 LAB — BASIC METABOLIC PANEL
Anion gap: 7 (ref 5–15)
BUN: 32 mg/dL — ABNORMAL HIGH (ref 8–23)
CO2: 22 mmol/L (ref 22–32)
Calcium: 9.4 mg/dL (ref 8.9–10.3)
Chloride: 111 mmol/L (ref 98–111)
Creatinine, Ser: 1.35 mg/dL — ABNORMAL HIGH (ref 0.44–1.00)
GFR, Estimated: 41 mL/min — ABNORMAL LOW (ref >60)
Glucose, Bld: 101 mg/dL — ABNORMAL HIGH (ref 70–99)
Potassium: 3.8 mmol/L (ref 3.5–5.1)
Sodium: 140 mmol/L (ref 135–145)

## 2021-03-26 ENCOUNTER — Inpatient Hospital Stay (HOSPITAL_COMMUNITY): Payer: Medicare PPO

## 2021-03-26 ENCOUNTER — Ambulatory Visit (INDEPENDENT_AMBULATORY_CARE_PROVIDER_SITE_OTHER): Payer: Medicare PPO | Admitting: Gastroenterology

## 2021-03-26 ENCOUNTER — Encounter: Payer: Self-pay | Admitting: Gastroenterology

## 2021-03-26 ENCOUNTER — Inpatient Hospital Stay (HOSPITAL_COMMUNITY): Payer: Medicare PPO | Attending: Hematology

## 2021-03-26 VITALS — BP 127/67 | HR 76 | Temp 97.0°F | Resp 16 | Wt 115.3 lb

## 2021-03-26 VITALS — BP 145/88 | HR 80 | Temp 97.6°F | Ht 69.0 in | Wt 119.6 lb

## 2021-03-26 DIAGNOSIS — C9000 Multiple myeloma not having achieved remission: Secondary | ICD-10-CM | POA: Diagnosis not present

## 2021-03-26 DIAGNOSIS — Z5112 Encounter for antineoplastic immunotherapy: Secondary | ICD-10-CM | POA: Insufficient documentation

## 2021-03-26 DIAGNOSIS — R197 Diarrhea, unspecified: Secondary | ICD-10-CM

## 2021-03-26 LAB — CBC WITH DIFFERENTIAL/PLATELET
Abs Immature Granulocytes: 0.01 10*3/uL (ref 0.00–0.07)
Basophils Absolute: 0 10*3/uL (ref 0.0–0.1)
Basophils Relative: 0 %
Eosinophils Absolute: 0.1 10*3/uL (ref 0.0–0.5)
Eosinophils Relative: 1 %
HCT: 33.7 % — ABNORMAL LOW (ref 36.0–46.0)
Hemoglobin: 10.9 g/dL — ABNORMAL LOW (ref 12.0–15.0)
Immature Granulocytes: 0 %
Lymphocytes Relative: 9 %
Lymphs Abs: 0.4 10*3/uL — ABNORMAL LOW (ref 0.7–4.0)
MCH: 33.1 pg (ref 26.0–34.0)
MCHC: 32.3 g/dL (ref 30.0–36.0)
MCV: 102.4 fL — ABNORMAL HIGH (ref 80.0–100.0)
Monocytes Absolute: 0.4 10*3/uL (ref 0.1–1.0)
Monocytes Relative: 8 %
Neutro Abs: 3.7 10*3/uL (ref 1.7–7.7)
Neutrophils Relative %: 82 %
Platelets: 146 10*3/uL — ABNORMAL LOW (ref 150–400)
RBC: 3.29 MIL/uL — ABNORMAL LOW (ref 3.87–5.11)
RDW: 14.1 % (ref 11.5–15.5)
WBC: 4.6 10*3/uL (ref 4.0–10.5)
nRBC: 0 % (ref 0.0–0.2)

## 2021-03-26 LAB — COMPREHENSIVE METABOLIC PANEL
ALT: 26 U/L (ref 0–44)
AST: 36 U/L (ref 15–41)
Albumin: 3.3 g/dL — ABNORMAL LOW (ref 3.5–5.0)
Alkaline Phosphatase: 86 U/L (ref 38–126)
Anion gap: 10 (ref 5–15)
BUN: 15 mg/dL (ref 8–23)
CO2: 25 mmol/L (ref 22–32)
Calcium: 8.9 mg/dL (ref 8.9–10.3)
Chloride: 106 mmol/L (ref 98–111)
Creatinine, Ser: 1.11 mg/dL — ABNORMAL HIGH (ref 0.44–1.00)
GFR, Estimated: 52 mL/min — ABNORMAL LOW (ref >60)
Glucose, Bld: 181 mg/dL — ABNORMAL HIGH (ref 70–99)
Potassium: 3.9 mmol/L (ref 3.5–5.1)
Sodium: 141 mmol/L (ref 135–145)
Total Bilirubin: 0.4 mg/dL (ref 0.3–1.2)
Total Protein: 6.7 g/dL (ref 6.5–8.1)

## 2021-03-26 LAB — LACTATE DEHYDROGENASE: LDH: 187 U/L (ref 98–192)

## 2021-03-26 MED ORDER — BORTEZOMIB CHEMO SQ INJECTION 3.5 MG (2.5MG/ML)
1.3000 mg/m2 | Freq: Once | INTRAMUSCULAR | Status: AC
  Administered 2021-03-26: 2 mg via SUBCUTANEOUS
  Filled 2021-03-26: qty 0.8

## 2021-03-26 MED ORDER — DEXAMETHASONE 4 MG PO TABS
10.0000 mg | ORAL_TABLET | Freq: Once | ORAL | Status: AC
  Administered 2021-03-26: 10 mg via ORAL
  Filled 2021-03-26: qty 3

## 2021-03-26 NOTE — Patient Instructions (Signed)
I am glad you are doing better!  We will see you in 6-8 months or sooner if needed!  I enjoyed seeing you again today! As you know, I value our relationship and want to provide genuine, compassionate, and quality care. I welcome your feedback. If you receive a survey regarding your visit,  I greatly appreciate you taking time to fill this out. See you next time!  Annitta Needs, PhD, ANP-BC [facility]

## 2021-03-26 NOTE — Progress Notes (Signed)
Patient presents today for Velcade injection.  Vital signs and Labs within parameters for treatment.  Patient has no new complaints since last visit.  Velcade injection and dexamethasone given today per MD orders. Stable during injection without adverse affects.  Injection site WNL.  Vital signs stable.  No complaints at this time.  Discharge from clinic via wheelchair in stable condition.  Alert and oriented X 3.  Follow up with [facility] as scheduled.

## 2021-03-26 NOTE — Patient Instructions (Signed)
Bortezomib injection What is this medicine? BORTEZOMIB (bor TEZ oh mib) targets proteins in cancer cells and stops the cancer cells from growing. It treats multiple myeloma and mantle cell lymphoma. This medicine may be used for other purposes; ask your health care provider or pharmacist if you have questions. COMMON BRAND NAME(S): Velcade What should I tell my health care provider before I take this medicine? They need to know if you have any of these conditions:  dehydration  diabetes (high blood sugar)  heart disease  liver disease  tingling of the fingers or toes or other nerve disorder  an unusual or allergic reaction to bortezomib, mannitol, boron, other medicines, foods, dyes, or preservatives  pregnant or trying to get pregnant  breast-feeding How should I use this medicine? This medicine is injected into a vein or under the skin. It is given by a health care provider in a hospital or clinic setting. Talk to your health care provider about the use of this medicine in children. Special care may be needed. Overdosage: If you think you have taken too much of this medicine contact a poison control center or emergency room at once. NOTE: This medicine is only for you. Do not share this medicine with others. What if I miss a dose? Keep appointments for follow-up doses. It is important not to miss your dose. Call your health care provider if you are unable to keep an appointment. What may interact with this medicine? This medicine may interact with the following medications:  ketoconazole  rifampin This list may not describe all possible interactions. Give your health care provider a list of all the medicines, herbs, non-prescription drugs, or dietary supplements you use. Also tell them if you smoke, drink alcohol, or use illegal drugs. Some items may interact with your medicine. What should I watch for while using this medicine? Your condition will be monitored carefully while  you are receiving this medicine. You may need blood work done while you are taking this medicine. You may get drowsy or dizzy. Do not drive, use machinery, or do anything that needs mental alertness until you know how this medicine affects you. Do not stand up or sit up quickly, especially if you are an older patient. This reduces the risk of dizzy or fainting spells This medicine may increase your risk of getting an infection. Call your health care provider for advice if you get a fever, chills, sore throat, or other symptoms of a cold or flu. Do not treat yourself. Try to avoid being around people who are sick. Check with your health care provider if you have severe diarrhea, nausea, and vomiting, or if you sweat a lot. The loss of too much body fluid may make it dangerous for you to take this medicine. Do not become pregnant while taking this medicine or for 7 months after stopping it. Women should inform their health care provider if they wish to become pregnant or think they might be pregnant. Men should not father a child while taking this medicine and for 4 months after stopping it. There is a potential for serious harm to an unborn child. Talk to your health care provider for more information. Do not breast-feed an infant while taking this medicine or for 2 months after stopping it. This medicine may make it more difficult to get pregnant or father a child. Talk to your health care provider if you are concerned about your fertility. What side effects may I notice from receiving this medicine?   Side effects that you should report to your doctor or health care professional as soon as possible:  allergic reactions (skin rash; itching or hives; swelling of the face, lips, or tongue)  bleeding (bloody or black, tarry stools; red or dark brown urine; spitting up blood or brown material that looks like coffee grounds; red spots on the skin; unusual bruising or bleeding from the eye, gums, or  nose)  blurred vision or changes in vision  confusion  constipation  headache  heart failure (trouble breathing; fast, irregular heartbeat; sudden weight gain; swelling of the ankles, feet, hands)  infection (fever, chills, cough, sore throat, pain or trouble passing urine)  lack or loss of appetite  liver injury (dark yellow or brown urine; general ill feeling or flu-like symptoms; loss of appetite, right upper belly pain; yellowing of the eyes or skin)  low blood pressure (dizziness; feeling faint or lightheaded, falls; unusually weak or tired)  muscle cramps  pain, redness, or irritation at site where injected  pain, tingling, numbness in the hands or feet  seizures  trouble breathing  unusual bruising or bleeding Side effects that usually do not require medical attention (report to your doctor or health care professional if they continue or are bothersome):  diarrhea  nausea  stomach pain  trouble sleeping  vomiting This list may not describe all possible side effects. Call your doctor for medical advice about side effects. You may report side effects to FDA at 1-800-FDA-1088. Where should I keep my medicine? This medicine is given in a hospital or clinic. It will not be stored at home. NOTE: This sheet is a summary. It may not cover all possible information. If you have questions about this medicine, talk to your doctor, pharmacist, or health care provider.  2021 Elsevier/Gold Standard (2020-11-22 13:22:53)

## 2021-03-26 NOTE — Progress Notes (Signed)
Referring Provider: Gerlene Fee, NP Primary Care Physician:  Gerlene Fee, NP  Primary GI: Dr. Abbey Chatters    Chief Complaint  Patient presents with  . Diarrhea    Almost every day, depending on what she eats or her diet    HPI:   Amanda Wu is a 75 y.o. female presenting today with a history of diarrhea, last seen in Jan 2022. Resident of [facility] [place]. Patient has history of Multiple Myeloma and receives treatment through Dr. Delton Coombes (Revlimid 70m 2 weeks on/2 weeks off along with Velcade 3 weeks on/1 week off. Dexamethasone 172mon days of Velcade). 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 Diarrhea suspected to be possibly related to Velcade and/or Revlimid. Stool [facility]. Cdiff not completed in our system. However[facility] and negative historically.  Creon started at last visit, taking 72,000 TID with meals and 36,000 units with snacks. Imodium TID, Questran 4 grams BID. Discussed with DeOk EdwardsNP. Diarrhea much improved and rare. Patient has gained weight. Good appetite.  No N/V. Limited history due to dementia. Very pleasant. 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 Benign hypertension   . Cataract   . Central nervous system lymphoma ([facility]  . [place] without complication ([place]  . H/O partial nephrectomy   . Hypokalemia   . Hypothyroidism   . Impaired cognition   . Multiple myeloma (HCC)    [facility]. Thyroid disease     Past Surgical History:  Procedure Laterality Date  . ABDOMINAL HYSTERECTOMY    . CRANIOTOMY     for lymphoma  . YAG LASER APPLICATION Right 06/20/48/4496 Procedure: YAG LASER APPLICATION;  Surgeon: CaWilliams CheMD;  Location: [facility];  Service: Ophthalmology;  Laterality: Right;     Check Interactions  Informants  Find Medications Needing Review  View by:  MaElta Guadeloupenselected Taking [facility]An[facility] Hospitalnselected Not Taking  Ed[address] - EdLedell [phone]  Taking?  acetaminophen (TYLENOL) 500 MG tablet  Take 1,000 mg by mouth every 6 (six) hours as needed for mild pain. Last Dose: Taking  Taking  Not Taking  Unknown   acyclovir (ZOVIRAX) 400 MG tablet  TAKE 1 TABLET BY MOUTH TWICE DAILY, Normal, Last Dose: Taking  Refills: 2 [place] [facility] [place]  Not Taking  Unknown   aspirin EC 81 MG tablet  Take 81 mg by mouth daily. Informant: Self, Last Dose: Taking  Taking  Not Taking  Unknown   Calcium Carbonate-Vitamin D (CALCIUM-VITAMIN D3 PO)  Take 400 Units by mouth daily in the afternoon. Last Dose: Taking  Taking  Not Taking  Unknown   cholestyramine (QUESTRAN) 4 g packet  Take 4 g by mouth 2 (two) times daily between meals. Last Dose: Taking  Taking  Not Taking  Unknown   Lactobacillus Probiotic TABS  Take 2 tablets by mouth daily. Last Dose: Taking  Taking  Not Taking  Unknown   levothyroxine (SYNTHROID, LEVOTHROID) 25 MCG tablet  Take 25 mcg by mouth daily before breakfast. Informant: Self, Last Dose: Taking  Taking  Not Taking  Unknown   lipase/protease/amylase (  CREON) 36000 UNITS CPEP capsule  Take 36,000 Units by mouth 2 (two) times daily. With snacks between meals at 10 am and 8 pm, Last Dose: Taking  Taking  Not Taking  Unknown   lipase/protease/amylase (CREON) 36000 UNITS CPEP capsule  Take 72,000 Units by mouth 3 (three) times daily with meals. 8 am, 12 pm, and 6 pm, Last Dose: Taking  Taking  Not Taking  Unknown   loperamide (IMODIUM A-D) 2 MG tablet  Take 4 mg by mouth 2 (two) times daily as needed for diarrhea or loose stools. Last Dose: Taking  Taking  Not Taking  Unknown   losartan (COZAAR) 50 MG tablet  Take 1 tablet (50 mg total)  by mouth daily., Starting Thu 05/24/2020, Normal, Last Dose: Taking  Refills: 4 ordered Pharmacy: [place],  - [place]  Taking  Not Taking  Unknown   Multiple Vitamins-Minerals (MULTIVITAMIN WOMEN 50+ PO)  Take by mouth daily.       potassium chloride SA (KLOR-CON) 20 MEQ tablet  Take 20 mEq by mouth daily. Last Dose: Taking  Received from: External Pharmacy  Allergies as of 03/26/2021 - Review Complete 03/26/2021  Allergen Reaction Noted  . Lotensin [benazepril]  12/05/2020    Family History  Problem Relation Age of Onset  . Depression Mother   . Diabetes Mother   . Heart disease Father   . Cancer - Prostate Brother   . Cancer Brother   . Cancer Sister     Social History   Socioeconomic History  . Marital status: Single    Spouse name: Not on file  . Number of children: Not on file  . Years of education: Not on file  . Highest education level: Not on file  Occupational History  . Occupation: retired   Tobacco Use  . Smoking status: Never Smoker  . Smokeless tobacco: Never Used  Vaping Use  . Vaping Use: Never used  Substance and Sexual Activity  . Alcohol use: No  . Drug use: No  . Sexual activity: Never  Other Topics Concern  . Not on file  Social History Narrative   [facility] term resident of [facility]    Social Determinants of Health   Financial Resource Strain: Low Risk   . Difficulty of Paying Living Expenses: Not very hard  Food Insecurity: No Food Insecurity  . Worried About Charity fundraiser in the Last Year: Never true  . Ran Out of Food in the Last Year: Never true  Transportation Needs: No Transportation Needs  . Lack of Transportation (Medical): No  . Lack of Transportation (Non-Medical): No  Physical Activity: Inactive  . Days of Exercise per Week: 0 days  . Minutes of Exercise per Session: 0 min  Stress: No Stress Concern Present  . Feeling of Stress : Not at all  Social Connections: Moderately Isolated  .  Frequency of Communication with Friends and Family: More than three times a week  . Frequency of Social Gatherings with Friends and Family: Once a week  . Attends Religious Services: More than 4 times per year  . Active Member of Clubs or Organizations: No  . Attends Archivist Meetings: Never  . Marital Status: Never married    Review of Systems: Limited due to cognitive status  Physical Exam: BP (!) 145/88   Pulse 80   Temp 97.6 F (36.4 C)   Ht '5\' 9"'  (1.753 m)   Wt 119 lb 9.6  oz (54.3 kg)   BMI 17.66 kg/m  General:   Alert and oriented. No distress noted. Pleasant and cooperative.  Head:  Normocephalic and atraumatic. Eyes:  Conjuctiva clear without scleral icterus. Mouth:  Mask in place Abdomen:  +BS, soft, non-tender and non-distended. No rebound or guarding. No HSM or masses noted. Msk:  Symmetrical without gross deformities. Normal posture. Extremities:  Without edema. Neurologic:  Alert and  oriented x4 Psych:  Alert and cooperative. Normal mood and affect.  ASSESSMENT: Amanda Wu is a delightful 75 y.o. female resident at the [facility] presenting in follow-up for chronic diarrhea, with improvement starting on Creon 2 capsules with meals and 1 with snacks. Additional supportive measures includes Questran and Imodium. She is continuing to gain weight slowly and doing well from our standpoint.   PLAN:  Continue current regimen Call if recurrent diarrhea Return in 6-8 months  Annitta Needs, PhD, [facility] Gastroenterology

## 2021-03-27 DIAGNOSIS — Z23 Encounter for immunization: Secondary | ICD-10-CM | POA: Diagnosis not present

## 2021-03-27 LAB — KAPPA/LAMBDA LIGHT CHAINS
Kappa free light chain: 20.1 mg/L — ABNORMAL HIGH (ref 3.3–19.4)
Kappa, lambda light chain ratio: 0.82 (ref 0.26–1.65)
Lambda free light chains: 24.6 mg/L (ref 5.7–26.3)

## 2021-03-28 LAB — PROTEIN ELECTROPHORESIS, SERUM
A/G Ratio: 1.3 (ref 0.7–1.7)
Albumin ELP: 3.6 g/dL (ref 2.9–4.4)
Alpha-1-Globulin: 0.3 g/dL (ref 0.0–0.4)
Alpha-2-Globulin: 0.9 g/dL (ref 0.4–1.0)
Beta Globulin: 0.9 g/dL (ref 0.7–1.3)
Gamma Globulin: 0.7 g/dL (ref 0.4–1.8)
Globulin, Total: 2.8 g/dL (ref 2.2–3.9)
M-Spike, %: 0.2 g/dL — ABNORMAL HIGH
Total Protein ELP: 6.4 g/dL (ref 6.0–8.5)

## 2021-04-02 ENCOUNTER — Encounter (HOSPITAL_COMMUNITY): Payer: Self-pay

## 2021-04-02 ENCOUNTER — Other Ambulatory Visit: Payer: Self-pay

## 2021-04-02 ENCOUNTER — Inpatient Hospital Stay (HOSPITAL_COMMUNITY): Payer: Medicare PPO

## 2021-04-02 VITALS — BP 139/66 | HR 76 | Temp 96.9°F | Resp 18

## 2021-04-02 DIAGNOSIS — C9000 Multiple myeloma not having achieved remission: Secondary | ICD-10-CM | POA: Diagnosis not present

## 2021-04-02 DIAGNOSIS — Z5112 Encounter for antineoplastic immunotherapy: Secondary | ICD-10-CM | POA: Diagnosis not present

## 2021-04-02 LAB — COMPREHENSIVE METABOLIC PANEL
ALT: 23 U/L (ref 0–44)
AST: 28 U/L (ref 15–41)
Albumin: 3.4 g/dL — ABNORMAL LOW (ref 3.5–5.0)
Alkaline Phosphatase: 73 U/L (ref 38–126)
Anion gap: 9 (ref 5–15)
BUN: 20 mg/dL (ref 8–23)
CO2: 27 mmol/L (ref 22–32)
Calcium: 8.9 mg/dL (ref 8.9–10.3)
Chloride: 103 mmol/L (ref 98–111)
Creatinine, Ser: 1.46 mg/dL — ABNORMAL HIGH (ref 0.44–1.00)
GFR, Estimated: 37 mL/min — ABNORMAL LOW (ref >60)
Glucose, Bld: 96 mg/dL (ref 70–99)
Potassium: 3.6 mmol/L (ref 3.5–5.1)
Sodium: 139 mmol/L (ref 135–145)
Total Bilirubin: 0.3 mg/dL (ref 0.3–1.2)
Total Protein: 7 g/dL (ref 6.5–8.1)

## 2021-04-02 LAB — CBC WITH DIFFERENTIAL/PLATELET
Abs Immature Granulocytes: 0.02 10*3/uL (ref 0.00–0.07)
Basophils Absolute: 0 10*3/uL (ref 0.0–0.1)
Basophils Relative: 0 %
Eosinophils Absolute: 0.1 10*3/uL (ref 0.0–0.5)
Eosinophils Relative: 2 %
HCT: 35.2 % — ABNORMAL LOW (ref 36.0–46.0)
Hemoglobin: 11.2 g/dL — ABNORMAL LOW (ref 12.0–15.0)
Immature Granulocytes: 0 %
Lymphocytes Relative: 15 %
Lymphs Abs: 0.7 10*3/uL (ref 0.7–4.0)
MCH: 32.4 pg (ref 26.0–34.0)
MCHC: 31.8 g/dL (ref 30.0–36.0)
MCV: 101.7 fL — ABNORMAL HIGH (ref 80.0–100.0)
Monocytes Absolute: 0.6 10*3/uL (ref 0.1–1.0)
Monocytes Relative: 13 %
Neutro Abs: 3.1 10*3/uL (ref 1.7–7.7)
Neutrophils Relative %: 70 %
Platelets: 144 10*3/uL — ABNORMAL LOW (ref 150–400)
RBC: 3.46 MIL/uL — ABNORMAL LOW (ref 3.87–5.11)
RDW: 14.4 % (ref 11.5–15.5)
WBC: 4.5 10*3/uL (ref 4.0–10.5)
nRBC: 0 % (ref 0.0–0.2)

## 2021-04-02 MED ORDER — DEXAMETHASONE 4 MG PO TABS
10.0000 mg | ORAL_TABLET | Freq: Once | ORAL | Status: AC
Start: 2021-04-02 — End: 2021-04-02
  Administered 2021-04-02: 10 mg via ORAL
  Filled 2021-04-02: qty 3

## 2021-04-02 MED ORDER — BORTEZOMIB CHEMO SQ INJECTION 3.5 MG (2.5MG/ML)
1.3000 mg/m2 | Freq: Once | INTRAMUSCULAR | Status: AC
  Administered 2021-04-02: 2 mg via SUBCUTANEOUS
  Filled 2021-04-02: qty 0.8

## 2021-04-02 NOTE — Progress Notes (Signed)
Patient tolerated Velcade injection with no complaints voiced. Lab work reviewed. See MAR for details. Injection site clean and dry with no bruising or swelling noted. Patient stable during and after injection. Band aid applied. VSS. Patient left in satisfactory condition with no s/s of distress noted. 

## 2021-04-03 MED ORDER — OCTREOTIDE ACETATE 30 MG IM KIT
PACK | INTRAMUSCULAR | Status: AC
  Filled 2021-04-03: qty 1

## 2021-04-08 ENCOUNTER — Other Ambulatory Visit (HOSPITAL_COMMUNITY)
Admission: RE | Admit: 2021-04-08 | Discharge: 2021-04-08 | Disposition: A | Discharge status: Home / Self Care | Payer: Medicare PPO | Source: Skilled Nursing Facility | Attending: Adult Health | Admitting: Adult Health

## 2021-04-08 DIAGNOSIS — E119 Type 2 diabetes mellitus without complications: Secondary | ICD-10-CM | POA: Insufficient documentation

## 2021-04-08 LAB — LIPID PANEL
Cholesterol: 142 mg/dL (ref 0–200)
HDL: 56 mg/dL (ref >40)
LDL Cholesterol: 66 mg/dL (ref 0–99)
Total CHOL/HDL Ratio: 2.5 RATIO
Triglycerides: 101 mg/dL (ref <150)
VLDL: 20 mg/dL (ref 0–40)

## 2021-04-09 ENCOUNTER — Non-Acute Institutional Stay (SKILLED_NURSING_FACILITY): Payer: Medicare PPO | Admitting: Adult Health

## 2021-04-09 ENCOUNTER — Encounter (HOSPITAL_COMMUNITY): Payer: Self-pay

## 2021-04-09 ENCOUNTER — Inpatient Hospital Stay (HOSPITAL_COMMUNITY): Payer: Medicare PPO

## 2021-04-09 ENCOUNTER — Encounter: Payer: Self-pay | Admitting: Adult Health

## 2021-04-09 ENCOUNTER — Other Ambulatory Visit: Payer: Self-pay

## 2021-04-09 VITALS — BP 118/58 | HR 79 | Temp 97.0°F | Resp 17 | Wt 114.6 lb

## 2021-04-09 DIAGNOSIS — E1159 Type 2 diabetes mellitus with other circulatory complications: Secondary | ICD-10-CM | POA: Diagnosis not present

## 2021-04-09 DIAGNOSIS — M810 Age-related osteoporosis without current pathological fracture: Secondary | ICD-10-CM

## 2021-04-09 DIAGNOSIS — E119 Type 2 diabetes mellitus without complications: Secondary | ICD-10-CM | POA: Diagnosis not present

## 2021-04-09 DIAGNOSIS — C9000 Multiple myeloma not having achieved remission: Secondary | ICD-10-CM

## 2021-04-09 DIAGNOSIS — B351 Tinea unguium: Secondary | ICD-10-CM | POA: Diagnosis not present

## 2021-04-09 DIAGNOSIS — I152 Hypertension secondary to endocrine disorders: Secondary | ICD-10-CM

## 2021-04-09 DIAGNOSIS — S32010D Wedge compression fracture of first lumbar vertebra, subsequent encounter for fracture with routine healing: Secondary | ICD-10-CM

## 2021-04-09 DIAGNOSIS — E1151 Type 2 diabetes mellitus with diabetic peripheral angiopathy without gangrene: Secondary | ICD-10-CM | POA: Diagnosis not present

## 2021-04-09 DIAGNOSIS — M2041 Other hammer toe(s) (acquired), right foot: Secondary | ICD-10-CM | POA: Diagnosis not present

## 2021-04-09 DIAGNOSIS — Z5112 Encounter for antineoplastic immunotherapy: Secondary | ICD-10-CM | POA: Diagnosis not present

## 2021-04-09 DIAGNOSIS — L603 Nail dystrophy: Secondary | ICD-10-CM | POA: Diagnosis not present

## 2021-04-09 DIAGNOSIS — Z794 Long term (current) use of insulin: Secondary | ICD-10-CM | POA: Diagnosis not present

## 2021-04-09 LAB — CBC WITH DIFFERENTIAL/PLATELET
Abs Immature Granulocytes: 0.01 10*3/uL (ref 0.00–0.07)
Basophils Absolute: 0 10*3/uL (ref 0.0–0.1)
Basophils Relative: 0 %
Eosinophils Absolute: 0.1 10*3/uL (ref 0.0–0.5)
Eosinophils Relative: 2 %
HCT: 35.3 % — ABNORMAL LOW (ref 36.0–46.0)
Hemoglobin: 11.3 g/dL — ABNORMAL LOW (ref 12.0–15.0)
Immature Granulocytes: 0 %
Lymphocytes Relative: 15 %
Lymphs Abs: 0.7 10*3/uL (ref 0.7–4.0)
MCH: 32.2 pg (ref 26.0–34.0)
MCHC: 32 g/dL (ref 30.0–36.0)
MCV: 100.6 fL — ABNORMAL HIGH (ref 80.0–100.0)
Monocytes Absolute: 0.7 10*3/uL (ref 0.1–1.0)
Monocytes Relative: 14 %
Neutro Abs: 3.2 10*3/uL (ref 1.7–7.7)
Neutrophils Relative %: 69 %
Platelets: 140 10*3/uL — ABNORMAL LOW (ref 150–400)
RBC: 3.51 MIL/uL — ABNORMAL LOW (ref 3.87–5.11)
RDW: 14.7 % (ref 11.5–15.5)
WBC: 4.6 10*3/uL (ref 4.0–10.5)
nRBC: 0 % (ref 0.0–0.2)

## 2021-04-09 LAB — COMPREHENSIVE METABOLIC PANEL
ALT: 20 U/L (ref 0–44)
AST: 30 U/L (ref 15–41)
Albumin: 3.4 g/dL — ABNORMAL LOW (ref 3.5–5.0)
Alkaline Phosphatase: 74 U/L (ref 38–126)
Anion gap: 11 (ref 5–15)
BUN: 20 mg/dL (ref 8–23)
CO2: 23 mmol/L (ref 22–32)
Calcium: 8.5 mg/dL — ABNORMAL LOW (ref 8.9–10.3)
Chloride: 105 mmol/L (ref 98–111)
Creatinine, Ser: 1.13 mg/dL — ABNORMAL HIGH (ref 0.44–1.00)
GFR, Estimated: 50 mL/min — ABNORMAL LOW (ref >60)
Glucose, Bld: 101 mg/dL — ABNORMAL HIGH (ref 70–99)
Potassium: 3.5 mmol/L (ref 3.5–5.1)
Sodium: 139 mmol/L (ref 135–145)
Total Bilirubin: 0.5 mg/dL (ref 0.3–1.2)
Total Protein: 6.7 g/dL (ref 6.5–8.1)

## 2021-04-09 MED ORDER — DEXAMETHASONE 4 MG PO TABS
10.0000 mg | ORAL_TABLET | Freq: Once | ORAL | Status: AC
  Administered 2021-04-09: 10 mg via ORAL

## 2021-04-09 MED ORDER — BORTEZOMIB CHEMO SQ INJECTION 3.5 MG (2.5MG/ML)
1.3000 mg/m2 | Freq: Once | INTRAMUSCULAR | Status: AC
  Administered 2021-04-09: 2 mg via SUBCUTANEOUS
  Filled 2021-04-09: qty 0.8

## 2021-04-09 MED ORDER — DEXAMETHASONE 4 MG PO TABS
ORAL_TABLET | ORAL | Status: AC
  Filled 2021-04-09: qty 1

## 2021-04-09 NOTE — Progress Notes (Signed)
Location:  [facility] Room Number: 148/W Place of Service:  SNF (31)   CODE STATUS: Full Code  Allergies  Allergen Reactions  . Lotensin [Benazepril]     Chief Complaint  Patient presents with  . Medical Management of Chronic Issues         Hypertension associated with type 2 diabetes mellitus:   Diabetes mellitus without complication:  Post menopausal osteoporosis    Compression fracture of L1 vertebrae with routine healing subsequent encounter    HPI:  She is a 75 year old long tem resident of this facility being seen for the management of her chronic illnesses: Hypertension associated with type 2 diabetes mellitus:   Diabetes mellitus without complication:  Post menopausal osteoporosis    Compression fracture of L1 vertebrae with routine healing subsequent encounter. She does get out of bed daily. She does become confused easily in the evening hours. There are no indications of pain present.   Past Medical History:  Diagnosis Date  . Benign hypertension   . Cataract   . 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 Diabetes mellitus without complication ([place])   . H/O partial nephrectomy   . Hypokalemia   . Hypothyroidism   . Impaired cognition   . Multiple myeloma (HCC)    Dr Maylon Peppers, [facility]  . Thyroid disease     Past Surgical History:  Procedure Laterality Date  . ABDOMINAL HYSTERECTOMY    . CRANIOTOMY     for lymphoma  . YAG LASER APPLICATION Right 0/25/4270   Procedure: YAG LASER APPLICATION;  Surgeon: [name], MD;  Location: [facility];  Service: Ophthalmology;  Laterality: Right;    Social History   Socioeconomic History  . Marital status: Single    Spouse name: Not on file  . Number of children: Not on file  . Years of education: Not on file  . Highest education level: Not on file  Occupational History  . Occupation: retired   Tobacco Use  . Smoking status: Never Smoker  . Smokeless tobacco: Never Used  Vaping Use  . Vaping  Use: Never used  Substance and Sexual Activity  . Alcohol use: No  . Drug use: No  . Sexual activity: Never  Other Topics Concern  . Not on file  Social History Narrative   Long term resident of [facility]    Social Determinants of Health   Financial Resource Strain: Low Risk   . Difficulty of Paying Living Expenses: Not very hard  Food Insecurity: No Food Insecurity  . Worried About Charity fundraiser in the Last Year: Never true  . Ran Out of Food in the Last Year: Never true  Transportation Needs: No Transportation Needs  . Lack of Transportation (Medical): No  . Lack of Transportation (Non-Medical): No  Physical Activity: Inactive  . Days of Exercise per Week: 0 days  . Minutes of Exercise per Session: 0 min  Stress: No Stress Concern Present  . Feeling of Stress : Not at all  Social Connections: Moderately Isolated  . Frequency of Communication with Friends and Family: More than three times a week  . Frequency of Social Gatherings with Friends and Family: Once a week  . Attends Religious Services: More than 4 times per year  . Active Member of Clubs or Organizations: No  . Attends Archivist Meetings: Never  . Marital Status: Never married  Intimate Partner Violence: Not on file   Family History  Problem  Relation Age of Onset  . Depression Mother   . Diabetes Mother   . Heart disease Father   . Cancer - Prostate Brother   . Cancer Brother   . Cancer Sister       VITAL SIGNS BP 133/83   Pulse 83   Temp 98.2 F (36.8 C)   Ht '5\' 9"'  (1.753 m)   Wt 121 lb 6.4 oz (55.1 kg)   BMI 17.93 kg/m   Facility-Administered Encounter Medications as of 04/09/2021  Medication  . prochlorperazine (COMPAZINE) tablet 10 mg   Outpatient Encounter Medications as of 04/09/2021  Medication Sig  . acetaminophen (TYLENOL) 500 MG tablet Take 1,000 mg by mouth every 6 (six) hours as needed for mild pain.  Marland Kitchen acyclovir (ZOVIRAX) 400 MG tablet TAKE 1 TABLET BY MOUTH TWICE DAILY   . aspirin EC 81 MG tablet Take 81 mg by mouth daily.  . Calcium Carbonate-Vitamin D (CALCIUM-VITAMIN D3 PO) Take 400 Units by mouth daily in the afternoon.  . cholestyramine (QUESTRAN) 4 g packet Take 4 g by mouth 2 (two) times daily between meals.  . Lactobacillus Probiotic TABS Take 2 tablets by mouth daily.  Marland Kitchen levothyroxine (SYNTHROID, LEVOTHROID) 25 MCG tablet Take 25 mcg by mouth daily before breakfast.  . lipase/protease/amylase (CREON) 36000 UNITS CPEP capsule Take 36,000 Units by mouth 2 (two) times daily. With snacks between meals at 10 am and 8 pm  . lipase/protease/amylase (CREON) 36000 UNITS CPEP capsule Take 72,000 Units by mouth 3 (three) times daily with meals. 8 am, 12 pm, and 6 pm  . loperamide (IMODIUM A-D) 2 MG tablet Take 4 mg by mouth 3 (three) times daily. For diarrhea  . losartan (COZAAR) 50 MG tablet Take 1 tablet (50 mg total) by mouth daily.  . Multiple Vitamins-Minerals (MULTIVITAMIN WOMEN 50+ PO) Take by mouth daily.   . NON FORMULARY Regular diet no dairy products.  . NON FORMULARY Wanderguard #[id number] to ankle for safety awareness. Check placement and function qshift. Special Instructions: Check placement and function qshift. Every Shift Day, Evening, Night  . potassium chloride SA (KLOR-CON) 20 MEQ tablet Take 20 mEq by mouth daily.      SIGNIFICANT DIAGNOSTIC EXAMS  PREVIOUS   08-10-20: t score: -3.758  12-21-20: KUBThere is a mild ileus noted. Mild to moderate increase feces in the colon. No renal stone is seen  12-25-20: KUB  There is a nonspecific bowel gas pattern without obstruction. No residual ileus is noted. Mild increased feces in the colon. No renal stone is seen.  02-05-21: ct of head:  1. No acute intracranial abnormality. Please note if concern for acute infarction MRI would be more sensitive. 2. Similar appearance of bifrontal encephalomalacia consistent with prior infarction/insult  02-05-21: MRI of brain:  No evidence of recent infarction,  hemorrhage, or mass. Bifrontal encephalomalacia/gliosis. Additional chronic microvascular ischemic changes. Questioned small left anterior clinoid process calcified meningioma on prior CT imaging is not well evaluated due to size and lack of contrast.  NO NEW EXAMS.     LABS REVIEWED PREVIOUS    07-20-20: vit B 12: 620  07-23-20: tsh 2.264 07-24-20: wbc 3.9; hgb 9.3; hct 29.3 mcv 105.4 plt 105; glucose 106; bun 25; creat 1.28; k+ 3.6; na++ 142; a 9.2 liver normal albumin 3.5  07-30-20: chol 116; ldl 49; trig 71; hdl 53; hgb a1c 5.1; hep C neg  08-07-20: wbc 3.5; hgb 9.0; hct 27.9; mcv 104.5 plt 112; glucose 86; bun 27; creat 1.22; k+ 4.2;  an++ 143; ca 9.0 liver normal albumin 3.2 08-14-20: wbc 2.8; hgb 9.4; hct 29.2; mcv 104.3 plt 101; glucose 71; bun 27; creat 1.41; k+ 4.3; na++ 140; ca 9.0 liver normal albumin 3.1  08-16-20: urine micro-albumin 9.3 08-21-20: wbc 3.9; hgb 9.5; hct 29.7 mcv 104.2 plt 86; glucose 86; bun 18; creat 1.09; k+ 3.7; na++ 144; ca 8.1; liver normal albumin 3.1  09-18-20: wbc 4.4; hgb 9.6; hct 29.7 mcv 102.4 plt 105; glucose 99; bun 20; creat 1.12; k+ 3.2; na++ 142; ca 8.8 liver normal albumin 3.2  10-16-20: wbc 3.9; hgb 10.2; hct 32.3; mcv 104.9 plt 95; glucose 98; bun 19; creat 1.25; k+ 3.9; na++ 139; ca 8.8 liver normal albumin 3.2  12-04-20: wbc 3.1; hgb 10.9; hct 34.6; mcv 103.0 plt 130; glucose 132; bun 27; creat 1.35; k+ 3.6; na++ 143; ca 9.3 liver normal albumin 3.5  12-25-20: wbc 2.7; hgb 10.1; hct 31.2 mcv 103.0 plt 120; glucose 108; bun 13; creat 1.53; k+ 3.9; na++ 138; ca 8.7 liver normal albumin 3.2 GFR 3 01-01-21: wbc 2.5; hgb 10.6; hct 32.1; mcv 100.6 plt 87; glucose 87; bun 27; creat 1.24; k+ 3.8; na++ 144; ca 9.3 GFR 45; liver normal albumin 3.4  02-05-21: wbc 3.9; hgb 10.2; hct 31.6; mcv 102.9 plt 91; glucose 118; bun 26; creat 1.29; k+ 3.8; na++ 138; ca 8.7 GFR43 liver normal albumin 3.0 urine culture: e-coli  TODAY  02-07-21: hgb a1c 5.3 04-08-21: chol 142; ldl  66; trig 101; hdl 56 04-09-21: wbc 4.6; hgb 11.3; hct 35.3; mcv 100.6 plt 140; glucose 101; bun 20; creat 1.13; k+ 3.5; na++ 139; ca 8.5 GFR 50; liver normal albumin 3.4    Review of Systems  Constitutional: Negative for malaise/fatigue.  Respiratory: Negative for cough and shortness of breath.   Cardiovascular: Negative for chest pain, palpitations and leg swelling.  Gastrointestinal: Negative for abdominal pain, constipation and heartburn.  Musculoskeletal: Negative for back pain, joint pain and myalgias.  Skin: Negative.   Neurological: Negative for dizziness.  Psychiatric/Behavioral: The patient is not nervous/anxious.     Physical Exam Constitutional:      General: She is not in acute distress.    Appearance: She is underweight. She is not diaphoretic.  Neck:     Thyroid: No thyromegaly.  Cardiovascular:     Rate and Rhythm: Normal rate and regular rhythm.     Heart sounds: Murmur heard.      Comments: 2/6 PP faint  Pulmonary:     Effort: Pulmonary effort is normal. No respiratory distress.     Breath sounds: Normal breath sounds.  Abdominal:     General: Bowel sounds are normal. There is no distension.     Palpations: Abdomen is soft.     Tenderness: There is no abdominal tenderness.  Musculoskeletal:        General: Normal range of motion.     Cervical back: Neck supple.     Right lower leg: No edema.     Left lower leg: No edema.  Lymphadenopathy:     Cervical: No cervical adenopathy.  Skin:    General: Skin is warm and dry.  Neurological:     Mental Status: She is alert. Mental status is at baseline.  Psychiatric:        Mood and Affect: Mood normal.      ASSESSMENT/ PLAN:  TODAY   1. Hypertension associated with type 2 diabetes mellitus: is stable b/p 133/83 will continue cozaar 50 mg daily  and asa 81 mg daily   2. Diabetes mellitus without complication: is stable hgb a1c 5.3 will monitor is on arb statin  3. Post menopausal osteoporosis t score  -3.758 will continue calcium and vitamin d  4. Compression fracture of L1 vertebrae with routine healing subsequent encounter: (06/2017) is stable has prn tylenol   PREVIOUS   5. Thrombocytopenia: is stable plt 105 will monitor   6. Multiple myeloma without achieving remission: is without change: is followed by oncology   7. Aortic atherosclerosis: will monitor   8. First degree av block/bradycardia: is stable has been seen by cardiology will monitor   9. Pancreatic insuffiencey: is slowly improving: will continue questran 4 gm twice daily; will continue creon 72,000 units with meals and imodium 2 mg three times daily  10. Macrocytic anemia is stable hgb 9.6 vit B 12: 620; is off iron will monitor   11. Vascular dementia with behavioral disturbance: is stable weight is 115 pounds her aricept was stopped due to side effects.   12 Herpes: no recent outbreaks; will continue acyclovir 400 mg twice daily   13. Failure to thrive in adult: is stable at this time her weight is stable at 121 pounds will continue to monitor her status.   14. Hypokalemia: k+ 3.5 will continue k+ 20 meq daily and will monitor  15. Hypothyroidism, unspecified type: is stable tsh 2.264 will continue synthroid 25 mcg daily      Ok Edwards NP [facility] Adult Medicine  Contact [phone] Monday through Friday 8am- 5pm  After hours call [phone]

## 2021-04-09 NOTE — Patient Instructions (Signed)
[facility]  Discharge Instructions: Thank you for choosing [facility] to provide your oncology and hematology care.  If you have a lab appointment with the [facility], please come in thru the Main Entrance and check in at the main information desk.  Wear comfortable clothing and clothing appropriate for easy access to any Portacath or PICC line.   We strive to give you quality time with your provider. You may need to reschedule your appointment if you arrive late (15 or more minutes).  Arriving late affects you and other patients whose appointments are after yours.  Also, if you miss three or more appointments without notifying the office, you may be dismissed from the clinic at the provider's discretion.      For prescription refill requests, have your pharmacy contact our office and allow 72 hours for refills to be completed. Today you received the following chemotherapy and/or immunotherapy agent Velcade injection.    To help prevent nausea and vomiting after your treatment, we encourage you to take your nausea medication as directed.  BELOW ARE SYMPTOMS THAT SHOULD BE REPORTED IMMEDIATELY: . *FEVER GREATER THAN 100.4 F (38 C) OR HIGHER . *CHILLS OR SWEATING . *NAUSEA AND VOMITING THAT IS NOT CONTROLLED WITH YOUR NAUSEA MEDICATION . *UNUSUAL SHORTNESS OF BREATH . *UNUSUAL BRUISING OR BLEEDING . *URINARY PROBLEMS (pain or burning when urinating, or frequent urination) . *BOWEL PROBLEMS (unusual diarrhea, constipation, pain near the anus) . TENDERNESS IN MOUTH AND THROAT WITH OR WITHOUT PRESENCE OF ULCERS (sore throat, sores in mouth, or a toothache) . UNUSUAL RASH, SWELLING OR PAIN  . UNUSUAL VAGINAL DISCHARGE OR ITCHING   Items with * indicate a potential emergency and should be followed up as soon as possible or go to the Emergency Department if any problems should occur.  Please show the CHEMOTHERAPY ALERT CARD or IMMUNOTHERAPY ALERT CARD at check-in  to the Emergency Department and triage nurse.  Should you have questions after your visit or need to cancel or reschedule your appointment, please contact [facility] [phone]  and follow the prompts.  Office hours are 8:00 a.m. to 4:30 p.m. Monday - Friday. Please note that voicemails left after 4:00 p.m. may not be returned until the following business day.  We are closed weekends and major holidays. You have access to a nurse at all times for urgent questions. Please call the main number to the clinic [phone] and follow the prompts.  For any non-urgent questions, you may also contact your provider using MyChart. We now offer e-Visits for anyone 58 and older to request care online for non-urgent symptoms. For details visit [email or web address].   Also download the MyChart app! Go to the app store, search "MyChart", open the app, select [facility], and log in with your MyChart username and password.  Due to Covid, a mask is required upon entering the hospital/clinic. If you do not have a mask, one will be given to you upon arrival. For doctor visits, patients may have 1 support person aged 69 or older with them. For treatment visits, patients cannot have anyone with them due to current Covid guidelines and our immunocompromised population.

## 2021-04-09 NOTE — Progress Notes (Signed)
Patient presents today for Velcade injection. Vital signs stable. Patient denies pain today. Patient has no complaints of any changes since her last treatment.   Velcade injection given today per MD orders. Tolerated without adverse affects. Vital signs stable. No complaints at this time. Discharged from clinic via wheel chair in stable condition. Alert and oriented x 3. F/U with [facility] as scheduled.

## 2021-04-10 ENCOUNTER — Encounter: Payer: Self-pay | Admitting: Adult Health

## 2021-04-10 NOTE — Progress Notes (Signed)
Location:  [place] Room Number: 148/W Place of Service:  SNF (31)   CODE STATUS: Full Code  Allergies  Allergen Reactions  . Lotensin [Benazepril]     Chief Complaint  Patient presents with  . Medical Management of Chronic Issues    Routine Visit of Medical Management   . Immunizations    Discuss need for PCV-13    HPI:    Past Medical History:  Diagnosis Date  . Benign hypertension   . Cataract   . 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 Diabetes mellitus without complication ([place])   . H/O partial nephrectomy   . Hypokalemia   . Hypothyroidism   . Impaired cognition   . Multiple myeloma (HCC)    Dr Maylon Peppers, [facility]  . Thyroid disease     Past Surgical History:  Procedure Laterality Date  . ABDOMINAL HYSTERECTOMY    . CRANIOTOMY     for lymphoma  . YAG LASER APPLICATION Right 6/76/1952   Procedure: YAG LASER APPLICATION;  Surgeon: [name], MD;  Location: [facility] ORS;  Service: Ophthalmology;  Laterality: Right;    Social History   Socioeconomic History  . Marital status: Single    Spouse name: Not on file  . Number of children: Not on file  . Years of education: Not on file  . Highest education level: Not on file  Occupational History  . Occupation: retired   Tobacco Use  . Smoking status: Never Smoker  . Smokeless tobacco: Never Used  Vaping Use  . Vaping Use: Never used  Substance and Sexual Activity  . Alcohol use: No  . Drug use: No  . Sexual activity: Never  Other Topics Concern  . Not on file  Social History Narrative   Long term resident of [facility]    Social Determinants of Health   Financial Resource Strain: Low Risk   . Difficulty of Paying Living Expenses: Not very hard  Food Insecurity: No Food Insecurity  . Worried About Charity fundraiser in the Last Year: Never true  . Ran Out of Food in the Last Year: Never true  Transportation Needs: No Transportation Needs  . Lack of Transportation (Medical):  No  . Lack of Transportation (Non-Medical): No  Physical Activity: Inactive  . Days of Exercise per Week: 0 days  . Minutes of Exercise per Session: 0 min  Stress: No Stress Concern Present  . Feeling of Stress : Not at all  Social Connections: Moderately Isolated  . Frequency of Communication with Friends and Family: More than three times a week  . Frequency of Social Gatherings with Friends and Family: Once a week  . Attends Religious Services: More than 4 times per year  . Active Member of Clubs or Organizations: No  . Attends Archivist Meetings: Never  . Marital Status: Never married  Intimate Partner Violence: Not on file   Family History  Problem Relation Age of Onset  . Depression Mother   . Diabetes Mother   . Heart disease Father   . Cancer - Prostate Brother   . Cancer Brother   . Cancer Sister       VITAL SIGNS BP 133/83   Pulse 83   Temp 98.2 F (36.8 C)   Ht '5\' 9"'  (1.753 m)   Wt 121 lb 6.4 oz (55.1 kg)   BMI 17.93 kg/m   Facility-Administered Encounter Medications as of 04/10/2021  Medication  . prochlorperazine (COMPAZINE) tablet  10 mg   Outpatient Encounter Medications as of 04/10/2021  Medication Sig  . acetaminophen (TYLENOL) 500 MG tablet Take 1,000 mg by mouth every 6 (six) hours as needed for mild pain.  Marland Kitchen acyclovir (ZOVIRAX) 400 MG tablet TAKE 1 TABLET BY MOUTH TWICE DAILY  . aspirin EC 81 MG tablet Take 81 mg by mouth daily.  . Calcium Carbonate-Vitamin D (CALCIUM-VITAMIN D3 PO) Take 400 Units by mouth daily in the afternoon.  . cholestyramine (QUESTRAN) 4 g packet Take 4 g by mouth 2 (two) times daily between meals.  . Lactobacillus Probiotic TABS Take 2 tablets by mouth daily.  Marland Kitchen levothyroxine (SYNTHROID, LEVOTHROID) 25 MCG tablet Take 25 mcg by mouth daily before breakfast.  . lipase/protease/amylase (CREON) 36000 UNITS CPEP capsule Take 36,000 Units by mouth 2 (two) times daily. With snacks between meals at 10 am and 8 pm  .  lipase/protease/amylase (CREON) 36000 UNITS CPEP capsule Take 72,000 Units by mouth 3 (three) times daily with meals. 8 am, 12 pm, and 6 pm  . loperamide (IMODIUM A-D) 2 MG tablet Take 4 mg by mouth 3 (three) times daily. For diarrhea  . losartan (COZAAR) 50 MG tablet Take 1 tablet (50 mg total) by mouth daily.  . Multiple Vitamins-Minerals (MULTIVITAMIN WOMEN 50+ PO) Take by mouth daily.   . NON FORMULARY Regular diet no dairy products.  . NON FORMULARY Wanderguard #5247 to ankle for safety awareness. Check placement and function qshift. Special Instructions: Check placement and function qshift. Every Shift Day, Evening, Night  . potassium chloride SA (KLOR-CON) 20 MEQ tablet Take 20 mEq by mouth daily.      SIGNIFICANT DIAGNOSTIC EXAMS       ASSESSMENT/ PLAN:     Ok Edwards NP [facility]  Contact [phone] Monday through Friday 8am- 5pm  After hours call [phone]

## 2021-04-12 ENCOUNTER — Other Ambulatory Visit (HOSPITAL_COMMUNITY)
Admission: RE | Admit: 2021-04-12 | Discharge: 2021-04-12 | Disposition: A | Discharge status: Home / Self Care | Payer: Medicare PPO | Source: Skilled Nursing Facility | Attending: Adult Health | Admitting: Adult Health

## 2021-04-12 DIAGNOSIS — E039 Hypothyroidism, unspecified: Secondary | ICD-10-CM | POA: Insufficient documentation

## 2021-04-12 LAB — VITAMIN B12: Vitamin B-12: 393 pg/mL (ref 180–914)

## 2021-04-12 LAB — TSH: TSH: 3.042 u[IU]/mL (ref 0.350–4.500)

## 2021-04-12 NOTE — Progress Notes (Signed)
This encounter was created in error - please disregard.

## 2021-04-23 ENCOUNTER — Inpatient Hospital Stay (HOSPITAL_COMMUNITY): Payer: Medicare PPO

## 2021-04-23 ENCOUNTER — Other Ambulatory Visit: Payer: Self-pay

## 2021-04-23 ENCOUNTER — Inpatient Hospital Stay (HOSPITAL_COMMUNITY): Payer: Medicare PPO | Attending: Hematology

## 2021-04-23 VITALS — BP 139/87 | HR 83 | Temp 96.8°F | Resp 18 | Wt 121.3 lb

## 2021-04-23 DIAGNOSIS — N189 Chronic kidney disease, unspecified: Secondary | ICD-10-CM | POA: Insufficient documentation

## 2021-04-23 DIAGNOSIS — E119 Type 2 diabetes mellitus without complications: Secondary | ICD-10-CM | POA: Diagnosis not present

## 2021-04-23 DIAGNOSIS — F0390 Unspecified dementia without behavioral disturbance: Secondary | ICD-10-CM | POA: Insufficient documentation

## 2021-04-23 DIAGNOSIS — Z5111 Encounter for antineoplastic chemotherapy: Secondary | ICD-10-CM | POA: Insufficient documentation

## 2021-04-23 DIAGNOSIS — E039 Hypothyroidism, unspecified: Secondary | ICD-10-CM | POA: Insufficient documentation

## 2021-04-23 DIAGNOSIS — R197 Diarrhea, unspecified: Secondary | ICD-10-CM | POA: Insufficient documentation

## 2021-04-23 DIAGNOSIS — C9000 Multiple myeloma not having achieved remission: Secondary | ICD-10-CM | POA: Diagnosis not present

## 2021-04-23 DIAGNOSIS — D631 Anemia in chronic kidney disease: Secondary | ICD-10-CM | POA: Insufficient documentation

## 2021-04-23 DIAGNOSIS — I10 Essential (primary) hypertension: Secondary | ICD-10-CM | POA: Insufficient documentation

## 2021-04-23 DIAGNOSIS — R634 Abnormal weight loss: Secondary | ICD-10-CM | POA: Diagnosis not present

## 2021-04-23 LAB — COMPREHENSIVE METABOLIC PANEL
ALT: 17 U/L (ref 0–44)
AST: 28 U/L (ref 15–41)
Albumin: 3.5 g/dL (ref 3.5–5.0)
Alkaline Phosphatase: 72 U/L (ref 38–126)
Anion gap: 7 (ref 5–15)
BUN: 19 mg/dL (ref 8–23)
CO2: 28 mmol/L (ref 22–32)
Calcium: 8.9 mg/dL (ref 8.9–10.3)
Chloride: 104 mmol/L (ref 98–111)
Creatinine, Ser: 1.21 mg/dL — ABNORMAL HIGH (ref 0.44–1.00)
GFR, Estimated: 46 mL/min — ABNORMAL LOW (ref >60)
Glucose, Bld: 118 mg/dL — ABNORMAL HIGH (ref 70–99)
Potassium: 3.5 mmol/L (ref 3.5–5.1)
Sodium: 139 mmol/L (ref 135–145)
Total Bilirubin: 0.7 mg/dL (ref 0.3–1.2)
Total Protein: 7.1 g/dL (ref 6.5–8.1)

## 2021-04-23 LAB — CBC WITH DIFFERENTIAL/PLATELET
Abs Immature Granulocytes: 0.01 10*3/uL (ref 0.00–0.07)
Basophils Absolute: 0 10*3/uL (ref 0.0–0.1)
Basophils Relative: 1 %
Eosinophils Absolute: 0.1 10*3/uL (ref 0.0–0.5)
Eosinophils Relative: 3 %
HCT: 36.2 % (ref 36.0–46.0)
Hemoglobin: 11.5 g/dL — ABNORMAL LOW (ref 12.0–15.0)
Immature Granulocytes: 0 %
Lymphocytes Relative: 15 %
Lymphs Abs: 0.7 10*3/uL (ref 0.7–4.0)
MCH: 31.9 pg (ref 26.0–34.0)
MCHC: 31.8 g/dL (ref 30.0–36.0)
MCV: 100.6 fL — ABNORMAL HIGH (ref 80.0–100.0)
Monocytes Absolute: 0.5 10*3/uL (ref 0.1–1.0)
Monocytes Relative: 11 %
Neutro Abs: 3.1 10*3/uL (ref 1.7–7.7)
Neutrophils Relative %: 70 %
Platelets: 178 10*3/uL (ref 150–400)
RBC: 3.6 MIL/uL — ABNORMAL LOW (ref 3.87–5.11)
RDW: 14.8 % (ref 11.5–15.5)
WBC: 4.4 10*3/uL (ref 4.0–10.5)
nRBC: 0 % (ref 0.0–0.2)

## 2021-04-23 LAB — LACTATE DEHYDROGENASE: LDH: 212 U/L — ABNORMAL HIGH (ref 98–192)

## 2021-04-23 MED ORDER — BORTEZOMIB CHEMO SQ INJECTION 3.5 MG (2.5MG/ML)
1.3000 mg/m2 | Freq: Once | INTRAMUSCULAR | Status: AC
  Administered 2021-04-23: 2 mg via SUBCUTANEOUS
  Filled 2021-04-23: qty 0.8

## 2021-04-23 MED ORDER — DEXAMETHASONE 4 MG PO TABS
10.0000 mg | ORAL_TABLET | Freq: Once | ORAL | Status: AC
Start: 2021-04-23 — End: 2021-04-23
  Administered 2021-04-23: 10 mg via ORAL

## 2021-04-23 MED ORDER — DEXAMETHASONE 4 MG PO TABS
ORAL_TABLET | ORAL | Status: AC
  Filled 2021-04-23: qty 3

## 2021-04-23 NOTE — Patient Instructions (Signed)
[facility]  Discharge Instructions: ?Thank you for choosing [facility] to provide your oncology and hematology care.  ?If you have a lab appointment with the Cancer Center, please come in thru the Main Entrance and check in at the main information desk. ? ?Wear comfortable clothing and clothing appropriate for easy access to any Portacath or PICC line.  ? ?We strive to give you quality time with your provider. You may need to reschedule your appointment if you arrive late (15 or more minutes).  Arriving late affects you and other patients whose appointments are after yours.  Also, if you miss three or more appointments without notifying the office, you may be dismissed from the clinic at the provider?s discretion.    ?  ?For prescription refill requests, have your pharmacy contact our office and allow 72 hours for refills to be completed.   ? ?Today you received the following chemotherapy and/or immunotherapy agents Velcade injection. ?  ?To help prevent nausea and vomiting after your treatment, we encourage you to take your nausea medication as directed. ? ?BELOW ARE SYMPTOMS THAT SHOULD BE REPORTED IMMEDIATELY: ?*FEVER GREATER THAN 100.4 F (38 ?C) OR HIGHER ?*CHILLS OR SWEATING ?*NAUSEA AND VOMITING THAT IS NOT CONTROLLED WITH YOUR NAUSEA MEDICATION ?*UNUSUAL SHORTNESS OF BREATH ?*UNUSUAL BRUISING OR BLEEDING ?*URINARY PROBLEMS (pain or burning when urinating, or frequent urination) ?*BOWEL PROBLEMS (unusual diarrhea, constipation, pain near the anus) ?TENDERNESS IN MOUTH AND THROAT WITH OR WITHOUT PRESENCE OF ULCERS (sore throat, sores in mouth, or a toothache) ?UNUSUAL RASH, SWELLING OR PAIN  ?UNUSUAL VAGINAL DISCHARGE OR ITCHING  ? ?Items with * indicate a potential emergency and should be followed up as soon as possible or go to the Emergency Department if any problems should occur. ? ?Please show the CHEMOTHERAPY ALERT CARD or IMMUNOTHERAPY ALERT CARD at check-in to the Emergency  Department and triage nurse. ? ?Should you have questions after your visit or need to cancel or reschedule your appointment, please contact [facility] [phone]  and follow the prompts.  Office hours are 8:00 a.m. to 4:30 p.m. Monday - Friday. Please note that voicemails left after 4:00 p.m. may not be returned until the following business day.  We are closed weekends and major holidays. You have access to a nurse at all times for urgent questions. Please call the main number to the clinic [phone] and follow the prompts. ? ?For any non-urgent questions, you may also contact your provider using MyChart. We now offer e-Visits for anyone 18 and older to request care online for non-urgent symptoms. For details visit [email or web address]. ?  ?Also download the MyChart app! Go to the app store, search "MyChart", open the app, select [facility], and log in with your MyChart username and password. ? ?Due to Covid, a mask is required upon entering the hospital/clinic. If you do not have a mask, one will be given to you upon arrival. For doctor visits, patients may have 1 support person aged 18 or older with them. For treatment visits, patients cannot have anyone with them due to current Covid guidelines and our immunocompromised population.  ?

## 2021-04-23 NOTE — Progress Notes (Signed)
Patient presents today for Velcade injection per MD orders.  Vital signs within parameters for treatment.  Labs pending.  Patient has no new complaints since last visit.  Reviewed labs, within parameters for treatment.  Velcade injection administration without incident; injection site WNL; see MAR for injection details.  Patient tolerated procedure well and without incident.  No questions or complaints noted at this time.  Vital signs stable.  No complaints at this time.  Discharge from clinic via wheelchair in stable condition.  Alert and oriented X 3.  Follow up with [facility] as scheduled.

## 2021-04-24 LAB — KAPPA/LAMBDA LIGHT CHAINS
Kappa free light chain: 20.4 mg/L — ABNORMAL HIGH (ref 3.3–19.4)
Kappa, lambda light chain ratio: 0.94 (ref 0.26–1.65)
Lambda free light chains: 21.6 mg/L (ref 5.7–26.3)

## 2021-04-24 MED ORDER — PEGFILGRASTIM-JMDB 6 MG/0.6ML ~~LOC~~ SOSY
PREFILLED_SYRINGE | SUBCUTANEOUS | Status: AC
  Filled 2021-04-24: qty 0.6

## 2021-04-24 MED ORDER — FULVESTRANT 250 MG/5ML IM SOLN
INTRAMUSCULAR | Status: AC
  Filled 2021-04-24: qty 5

## 2021-04-25 LAB — PROTEIN ELECTROPHORESIS, SERUM
A/G Ratio: 1.1 (ref 0.7–1.7)
Albumin ELP: 3.4 g/dL (ref 2.9–4.4)
Alpha-1-Globulin: 0.3 g/dL (ref 0.0–0.4)
Alpha-2-Globulin: 0.9 g/dL (ref 0.4–1.0)
Beta Globulin: 0.9 g/dL (ref 0.7–1.3)
Gamma Globulin: 1.1 g/dL (ref 0.4–1.8)
Globulin, Total: 3.1 g/dL (ref 2.2–3.9)
M-Spike, %: 0.4 g/dL — ABNORMAL HIGH
Total Protein ELP: 6.5 g/dL (ref 6.0–8.5)

## 2021-04-25 LAB — IMMUNOFIXATION ELECTROPHORESIS
IgA: 103 mg/dL (ref 64–422)
IgG (Immunoglobin G), Serum: 1269 mg/dL (ref 586–1602)
IgM (Immunoglobulin M), Srm: 15 mg/dL — ABNORMAL LOW (ref 26–217)
Total Protein ELP: 6.5 g/dL (ref 6.0–8.5)

## 2021-04-29 NOTE — Progress Notes (Signed)
Amanda Wu, [place] [address]   CLINIC:  Medical Oncology/Hematology  PCP:  Gerlene Fee, NP [address] [place] [address] [phone]   REASON FOR VISIT:  Follow-up for multiple myeloma  PRIOR THERAPY: none  NGS Results: not done  CURRENT THERAPY: Velcade & Decadron 3/4 weeks; Revlimid 2/4 weeks  BRIEF ONCOLOGIC HISTORY:  Oncology History  Multiple myeloma ([place])  07/09/2017 Initial Diagnosis   Multiple myeloma ([place])   06/01/2018 -  Chemotherapy    Patient is on Treatment Plan: MYELOMA MAINTENANCE BORTEZOMIB SQ D1,8,15 / DEXAMETHASONE ORAL D1,8,15 Q28D         CANCER STAGING: Cancer Staging No matching staging information was found for the patient.  INTERVAL HISTORY:  Amanda Wu, a 75 y.o. female, returns for routine follow-up and consideration for next cycle of chemotherapy. Amanda Wu was last seen on 03/05/2021.  Due for day #8 cycle #38 of Bortezomib today.   Overall, Amanda Wu tells me Amanda Wu has been feeling pretty well. Amanda Wu reports one bowel movement a day which is occasionally watery. Amanda Wu denies any new pains, tingling, or numbness.  Overall, Amanda Wu feels ready for next cycle of chemo today. Amanda Wu had a GI bleed and was admitted to [facility]. Amanda Wu denies any infections. Amanda Wu cannot recall if Amanda Wu has been drinking Boost/Ensure, but Amanda Wu has been eating solid meals.   REVIEW OF SYSTEMS:  Review of Systems  Constitutional: Positive for appetite change (75%) and fatigue (75%).  Gastrointestinal: Positive for diarrhea.  All other systems reviewed and are negative.   PAST MEDICAL/SURGICAL HISTORY:  Past Medical History:  Diagnosis Date  . Benign hypertension   . Cataract   . 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 Diabetes mellitus without complication ([place])   . H/O partial nephrectomy   . Hypokalemia   . Hypothyroidism   . Impaired cognition   . Multiple myeloma (HCC)    Dr Maylon Peppers, [facility]  . Thyroid disease     Past Surgical History:  Procedure Laterality Date  . ABDOMINAL HYSTERECTOMY    . CRANIOTOMY     for lymphoma  . YAG LASER APPLICATION Right 0/92/3300   Procedure: YAG LASER APPLICATION;  Surgeon: [name], MD;  Location: [facility];  Service: Ophthalmology;  Laterality: Right;    SOCIAL HISTORY:  Social History   Socioeconomic History  . Marital status: Single    Spouse name: Not on file  . Number of children: Not on file  . Years of education: Not on file  . Highest education level: Not on file  Occupational History  . Occupation: retired   Tobacco Use  . Smoking status: Never Smoker  . Smokeless tobacco: Never Used  Vaping Use  . Vaping Use: Never used  Substance and Sexual Activity  . Alcohol use: No  . Drug use: No  . Sexual activity: Never  Other Topics Concern  . Not on file  Social History Narrative   Long term resident of [facility]    Social Determinants of Health   Financial Resource Strain: Low Risk   . Difficulty of Paying Living Expenses: Not very hard  Food Insecurity: No Food Insecurity  . Worried About Charity fundraiser in the Last Year: Never true  . Ran Out of Food in the Last Year: Never true  Transportation Needs: No Transportation Needs  . Lack of Transportation (Medical): No  . Lack of Transportation (Non-Medical): No  Physical Activity:  Inactive  . Days of Exercise per Week: 0 days  . Minutes of Exercise per Session: 0 min  Stress: No Stress Concern Present  . Feeling of Stress : Not at all  Social Connections: Moderately Isolated  . Frequency of Communication with Friends and Family: More than three times a week  . Frequency of Social Gatherings with Friends and Family: Once a week  . Attends Religious Services: More than 4 times per year  . Active Member of Clubs or Organizations: No  . Attends Archivist Meetings: Never  . Marital Status: Never married  Human resources officer Violence: Not on file    FAMILY HISTORY:  Family  History  Problem Relation Age of Onset  . Depression Mother   . Diabetes Mother   . Heart disease Father   . Cancer - Prostate Brother   . Cancer Brother   . Cancer Sister     CURRENT MEDICATIONS:  No current facility-administered medications for this visit.   No current outpatient medications on file.   Facility-Administered Medications Ordered in Other Visits  Medication Dose Route Frequency Provider Last Rate Last Admin  . prochlorperazine (COMPAZINE) tablet 10 mg  10 mg Oral Once Derek Jack, MD        ALLERGIES:  Allergies  Allergen Reactions  . Lotensin [Benazepril]     PHYSICAL EXAM:  Performance status (ECOG): 2 - Symptomatic, <50% confined to bed  There were no vitals filed for this visit. Wt Readings from Last 3 Encounters:  04/23/21 121 lb 4.1 oz (55 kg)  04/10/21 121 lb 6.4 oz (55.1 kg)  04/09/21 114 lb 9.6 oz (52 kg)   Physical Exam Vitals reviewed.  Constitutional:      Appearance: Normal appearance.  Cardiovascular:     Rate and Rhythm: Normal rate and regular rhythm.     Pulses: Normal pulses.     Heart sounds: Normal heart sounds.  Pulmonary:     Effort: Pulmonary effort is normal.     Breath sounds: Normal breath sounds.  Abdominal:     Palpations: Abdomen is soft. There is no hepatomegaly, splenomegaly or mass.     Tenderness: There is no abdominal tenderness.  Musculoskeletal:     Right lower leg: No edema.     Left lower leg: No edema.  Neurological:     General: No focal deficit present.     Mental Status: Amanda Wu is alert and oriented to person, place, and time.  Psychiatric:        Mood and Affect: Mood normal.        Behavior: Behavior normal.     LABORATORY DATA:  I have reviewed the labs as listed.  CBC Latest Ref Rng & Units 04/23/2021 04/09/2021 04/02/2021  WBC 4.0 - 10.5 K/uL 4.4 4.6 4.5  Hemoglobin 12.0 - 15.0 g/dL 11.5(L) 11.3(L) 11.2(L)  Hematocrit 36.0 - 46.0 % 36.2 35.3(L) 35.2(L)  Platelets 150 - 400 K/uL 178  140(L) 144(L)   CMP Latest Ref Rng & Units 04/23/2021 04/09/2021 04/02/2021  Glucose 70 - 99 mg/dL 118(H) 101(H) 96  BUN 8 - 23 mg/dL '19 20 20  ' Creatinine 0.44 - 1.00 mg/dL 1.21(H) 1.13(H) 1.46(H)  Sodium 135 - 145 mmol/L 139 139 139  Potassium 3.5 - 5.1 mmol/L 3.5 3.5 3.6  Chloride 98 - 111 mmol/L 104 105 103  CO2 22 - 32 mmol/L '28 23 27  ' Calcium 8.9 - 10.3 mg/dL 8.9 8.5(L) 8.9  Total Protein 6.5 - 8.1 g/dL 7.1 6.7  7.0  Total Bilirubin 0.3 - 1.2 mg/dL 0.7 0.5 0.3  Alkaline Phos 38 - 126 U/L 72 74 73  AST 15 - 41 U/L '28 30 28  ' ALT 0 - 44 U/L '17 20 23    ' DIAGNOSTIC IMAGING:  I have independently reviewed the scans and discussed with the patient. No results found.   ASSESSMENT:  1. IgG lambda multiple myeloma: -Amanda Wu is on Revlimid 10 mg 2 weeks on/2 weeks of along with Velcade 3 weeks on/1 week off. Dexamethasone 10 mg on days of Velcade. -Myeloma panel on 04/17/2020 shows M spike 0.5 g. Kappa light chains are 44. Lambda light chains are 34. Ratio is 1.3. -Resident of [facility] since 07/19/2020.  2. 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   PLAN:  1. IgG lambda multiple myeloma: -Revlimid was discontinued secondary to diarrhea. - Amanda Wu is currently taking Velcade on days, 1, 8, 15 every 28 days with dexamethasone 10 mg on days of Velcade. - Reviewed myeloma labs from 04/23/2021 which showed M spike of 0.4 g.  Free light chain ratio is normal at 0.94.  Kappa light chains are 20.4.  Immunofixation shows IgG lambda monoclonal protein. - We will continue Velcade 3 weeks on 1 week off at this time.  RTC 8 weeks for follow-up.  2. CKD: -Creatinine has improved to 1.08.  3. Weight loss: -Amanda Wu weight has been stable since last visit.  4. Dementia: -Continue Aricept 10 mg daily.  5. Shingles prophylaxis: -Continue acyclovir 400 mg twice daily.  6. Myeloma bone disease: -Amanda Wu was treated with Zometa for several years which was  discontinued.  7. Macrocytic anemia: -This is from myelosuppression from treatments and CKD.  Hemoglobin is 11.4.  8.Diarrhea: -Amanda Wu is not taking Questran.  Previous GI work-up was negative. - Diarrhea improved at this time.   Orders placed this encounter:  No orders of the defined types were placed in this encounter.    Derek Jack, MD [place] [phone]   I, Thana Ates, am acting as a scribe for Dr. Derek Jack.  I, Derek Jack MD, have reviewed the above documentation for accuracy and completeness, and I agree with the above.

## 2021-04-30 ENCOUNTER — Inpatient Hospital Stay (HOSPITAL_COMMUNITY): Payer: Medicare PPO

## 2021-04-30 ENCOUNTER — Inpatient Hospital Stay (HOSPITAL_BASED_OUTPATIENT_CLINIC_OR_DEPARTMENT_OTHER): Payer: Medicare PPO | Admitting: Hematology

## 2021-04-30 VITALS — BP 126/73 | HR 66 | Temp 97.0°F | Resp 16 | Wt 118.4 lb

## 2021-04-30 DIAGNOSIS — R197 Diarrhea, unspecified: Secondary | ICD-10-CM | POA: Diagnosis not present

## 2021-04-30 DIAGNOSIS — Z5111 Encounter for antineoplastic chemotherapy: Secondary | ICD-10-CM | POA: Diagnosis not present

## 2021-04-30 DIAGNOSIS — I10 Essential (primary) hypertension: Secondary | ICD-10-CM | POA: Diagnosis not present

## 2021-04-30 DIAGNOSIS — E039 Hypothyroidism, unspecified: Secondary | ICD-10-CM | POA: Diagnosis not present

## 2021-04-30 DIAGNOSIS — C9000 Multiple myeloma not having achieved remission: Secondary | ICD-10-CM

## 2021-04-30 DIAGNOSIS — F0390 Unspecified dementia without behavioral disturbance: Secondary | ICD-10-CM | POA: Diagnosis not present

## 2021-04-30 DIAGNOSIS — D631 Anemia in chronic kidney disease: Secondary | ICD-10-CM | POA: Diagnosis not present

## 2021-04-30 DIAGNOSIS — E119 Type 2 diabetes mellitus without complications: Secondary | ICD-10-CM | POA: Diagnosis not present

## 2021-04-30 DIAGNOSIS — N189 Chronic kidney disease, unspecified: Secondary | ICD-10-CM | POA: Diagnosis not present

## 2021-04-30 LAB — CBC WITH DIFFERENTIAL/PLATELET
Abs Immature Granulocytes: 0 10*3/uL (ref 0.00–0.07)
Basophils Absolute: 0 10*3/uL (ref 0.0–0.1)
Basophils Relative: 0 %
Eosinophils Absolute: 0.1 10*3/uL (ref 0.0–0.5)
Eosinophils Relative: 2 %
HCT: 35.6 % — ABNORMAL LOW (ref 36.0–46.0)
Hemoglobin: 11.4 g/dL — ABNORMAL LOW (ref 12.0–15.0)
Immature Granulocytes: 0 %
Lymphocytes Relative: 16 %
Lymphs Abs: 0.7 10*3/uL (ref 0.7–4.0)
MCH: 31.9 pg (ref 26.0–34.0)
MCHC: 32 g/dL (ref 30.0–36.0)
MCV: 99.7 fL (ref 80.0–100.0)
Monocytes Absolute: 0.5 10*3/uL (ref 0.1–1.0)
Monocytes Relative: 12 %
Neutro Abs: 2.9 10*3/uL (ref 1.7–7.7)
Neutrophils Relative %: 70 %
Platelets: 134 10*3/uL — ABNORMAL LOW (ref 150–400)
RBC: 3.57 MIL/uL — ABNORMAL LOW (ref 3.87–5.11)
RDW: 14.6 % (ref 11.5–15.5)
WBC: 4.2 10*3/uL (ref 4.0–10.5)
nRBC: 0 % (ref 0.0–0.2)

## 2021-04-30 LAB — COMPREHENSIVE METABOLIC PANEL
ALT: 27 U/L (ref 0–44)
AST: 33 U/L (ref 15–41)
Albumin: 3.4 g/dL — ABNORMAL LOW (ref 3.5–5.0)
Alkaline Phosphatase: 81 U/L (ref 38–126)
Anion gap: 10 (ref 5–15)
BUN: 18 mg/dL (ref 8–23)
CO2: 26 mmol/L (ref 22–32)
Calcium: 9 mg/dL (ref 8.9–10.3)
Chloride: 104 mmol/L (ref 98–111)
Creatinine, Ser: 1.08 mg/dL — ABNORMAL HIGH (ref 0.44–1.00)
GFR, Estimated: 53 mL/min — ABNORMAL LOW (ref >60)
Glucose, Bld: 133 mg/dL — ABNORMAL HIGH (ref 70–99)
Potassium: 3.6 mmol/L (ref 3.5–5.1)
Sodium: 140 mmol/L (ref 135–145)
Total Bilirubin: 0.4 mg/dL (ref 0.3–1.2)
Total Protein: 6.9 g/dL (ref 6.5–8.1)

## 2021-04-30 MED ORDER — DEXAMETHASONE 4 MG PO TABS
10.0000 mg | ORAL_TABLET | Freq: Once | ORAL | Status: AC
  Administered 2021-04-30: 10 mg via ORAL
  Filled 2021-04-30: qty 3

## 2021-04-30 MED ORDER — BORTEZOMIB CHEMO SQ INJECTION 3.5 MG (2.5MG/ML)
1.3000 mg/m2 | Freq: Once | INTRAMUSCULAR | Status: AC
  Administered 2021-04-30: 2 mg via SUBCUTANEOUS
  Filled 2021-04-30: qty 0.8

## 2021-04-30 NOTE — Progress Notes (Signed)
Patient presents today for Velcade injection per MD order.  Vital signs and labs within parameters.  No new complaints since last visit.  Message received from [facility] RN/Dr. Delton Coombes patient is okay for treatment.  Velcade injection given today per MD orders.  Stable during injection without adverse affects.  Vital signs stable.  Injection site WNL.  No complaints at this time.  Discharge from clinic via wheelchair in stable condition.  Alert and oriented X 3.  Follow up with [facility] as scheduled.

## 2021-04-30 NOTE — Patient Instructions (Addendum)
[place] at [facility] Discharge Instructions  You were seen today by Dr. Delton Coombes. He went over your recent results. You received treatment today. Dr. Delton Coombes will see you back in 2 months for labs and follow up.   Thank you for choosing [place] at [facility] to provide your oncology and hematology care.  To afford each patient quality time with our provider, please arrive at least 15 minutes before your scheduled appointment time.   If you have a lab appointment with the [facility] please come in thru the Main Entrance and check in at the main information desk  You need to re-schedule your appointment should you arrive 10 or more minutes late.  We strive to give you quality time with our providers, and arriving late affects you and other patients whose appointments are after yours.  Also, if you no show three or more times for appointments you may be dismissed from the clinic at the providers discretion.     Again, thank you for choosing [facility].  Our hope is that these requests will decrease the amount of time that you wait before being seen by our physicians.       _____________________________________________________________  Should you have questions after your visit to [facility], please contact our office at [phone] between the hours of 8:00 a.m. and 4:30 p.m.  Voicemails left after 4:00 p.m. will not be returned until the following business day.  For prescription refill requests, have your pharmacy contact our office and allow 72 hours.    Cancer Center Support Programs:   > Cancer Support Group  2nd Tuesday of the month 1pm-2pm, Journey Room

## 2021-04-30 NOTE — Patient Instructions (Signed)
[facility]  Discharge Instructions: ?Thank you for choosing [facility] to provide your oncology and hematology care.  ?If you have a lab appointment with the Cancer Center, please come in thru the Main Entrance and check in at the main information desk. ? ?Wear comfortable clothing and clothing appropriate for easy access to any Portacath or PICC line.  ? ?We strive to give you quality time with your provider. You may need to reschedule your appointment if you arrive late (15 or more minutes).  Arriving late affects you and other patients whose appointments are after yours.  Also, if you miss three or more appointments without notifying the office, you may be dismissed from the clinic at the provider?s discretion.    ?  ?For prescription refill requests, have your pharmacy contact our office and allow 72 hours for refills to be completed.   ? ?Today you received the following chemotherapy and/or immunotherapy agents Velcade injection. ?  ?To help prevent nausea and vomiting after your treatment, we encourage you to take your nausea medication as directed. ? ?BELOW ARE SYMPTOMS THAT SHOULD BE REPORTED IMMEDIATELY: ?*FEVER GREATER THAN 100.4 F (38 ?C) OR HIGHER ?*CHILLS OR SWEATING ?*NAUSEA AND VOMITING THAT IS NOT CONTROLLED WITH YOUR NAUSEA MEDICATION ?*UNUSUAL SHORTNESS OF BREATH ?*UNUSUAL BRUISING OR BLEEDING ?*URINARY PROBLEMS (pain or burning when urinating, or frequent urination) ?*BOWEL PROBLEMS (unusual diarrhea, constipation, pain near the anus) ?TENDERNESS IN MOUTH AND THROAT WITH OR WITHOUT PRESENCE OF ULCERS (sore throat, sores in mouth, or a toothache) ?UNUSUAL RASH, SWELLING OR PAIN  ?UNUSUAL VAGINAL DISCHARGE OR ITCHING  ? ?Items with * indicate a potential emergency and should be followed up as soon as possible or go to the Emergency Department if any problems should occur. ? ?Please show the CHEMOTHERAPY ALERT CARD or IMMUNOTHERAPY ALERT CARD at check-in to the Emergency  Department and triage nurse. ? ?Should you have questions after your visit or need to cancel or reschedule your appointment, please contact [facility] [phone]  and follow the prompts.  Office hours are 8:00 a.m. to 4:30 p.m. Monday - Friday. Please note that voicemails left after 4:00 p.m. may not be returned until the following business day.  We are closed weekends and major holidays. You have access to a nurse at all times for urgent questions. Please call the main number to the clinic [phone] and follow the prompts. ? ?For any non-urgent questions, you may also contact your provider using MyChart. We now offer e-Visits for anyone 18 and older to request care online for non-urgent symptoms. For details visit [email or web address]. ?  ?Also download the MyChart app! Go to the app store, search "MyChart", open the app, select [place], and log in with your MyChart username and password. ? ?Due to Covid, a mask is required upon entering the hospital/clinic. If you do not have a mask, one will be given to you upon arrival. For doctor visits, patients may have 1 support person aged 18 or older with them. For treatment visits, patients cannot have anyone with them due to current Covid guidelines and our immunocompromised population.  ?

## 2021-04-30 NOTE — Progress Notes (Signed)
Patient assessed and labs reviewed by Dr Delton Coombes.  No distress noted at this time.  Okay for treatment.

## 2021-05-02 ENCOUNTER — Encounter: Payer: Self-pay | Admitting: Adult Health

## 2021-05-02 ENCOUNTER — Non-Acute Institutional Stay (SKILLED_NURSING_FACILITY): Payer: Medicare PPO | Admitting: Adult Health

## 2021-05-02 DIAGNOSIS — F01518 Vascular dementia, unspecified severity, with other behavioral disturbance: Secondary | ICD-10-CM

## 2021-05-02 DIAGNOSIS — R627 Adult failure to thrive: Secondary | ICD-10-CM | POA: Diagnosis not present

## 2021-05-02 DIAGNOSIS — F0151 Vascular dementia with behavioral disturbance: Secondary | ICD-10-CM

## 2021-05-02 DIAGNOSIS — I700 Atherosclerosis of aorta: Secondary | ICD-10-CM

## 2021-05-02 DIAGNOSIS — C9000 Multiple myeloma not having achieved remission: Secondary | ICD-10-CM | POA: Diagnosis not present

## 2021-05-02 NOTE — Progress Notes (Signed)
Location:  [place] Room Number: 354 Place of Service:  SNF (31)   CODE STATUS: full code   Allergies  Allergen Reactions  . Lotensin [Benazepril]     Chief Complaint  Patient presents with  . Acute Visit    Care plan meeting.     HPI:  We have come together for her care plan meeting. Family present  BIMS 6/15 mood 0/30. She has had increased wandering; increased agitation as her sister is in the hospital; she is up most nights. She is nonambulatory. She requires extensive assist with her adls. She is able to feed herself. She is frequently incontinent of bladder and bowel. She has a wander guard in place. There have been no falls.  Therapy none at this time   Weight good appetite; weight is improving; is up to 120 pounds on a regular diet.  she continues to be followed for her chronic illnesses including: Adult failure to thrive Aortic atherosclerosis  Vascular dementia with behavioral disturbance  Multiple myeloma not in remission.  Past Medical History:  Diagnosis Date  . Benign hypertension   . Cataract   . 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 Diabetes mellitus without complication ([place])   . H/O partial nephrectomy   . Hypokalemia   . Hypothyroidism   . Impaired cognition   . Multiple myeloma ([facility])    Dr Maylon Peppers, [facility]  . Thyroid disease     Past Surgical History:  Procedure Laterality Date  . ABDOMINAL HYSTERECTOMY    . CRANIOTOMY     for lymphoma  . YAG LASER APPLICATION Right 5/62/5638   Procedure: YAG LASER APPLICATION;  Surgeon: [name], MD;  Location: [facility];  Service: Ophthalmology;  Laterality: Right;    Social History   Socioeconomic History  . Marital status: Single    Spouse name: Not on file  . Number of children: Not on file  . Years of education: Not on file  . Highest education level: Not on file  Occupational History  . Occupation: retired   Tobacco Use  . Smoking status: Never Smoker  . Smokeless  tobacco: Never Used  Vaping Use  . Vaping Use: Never used  Substance and Sexual Activity  . Alcohol use: No  . Drug use: No  . Sexual activity: Never  Other Topics Concern  . Not on file  Social History Narrative   Long term resident of [facility]    Social Determinants of Health   Financial Resource Strain: Low Risk   . Difficulty of Paying Living Expenses: Not very hard  Food Insecurity: No Food Insecurity  . Worried About Charity fundraiser in the Last Year: Never true  . Ran Out of Food in the Last Year: Never true  Transportation Needs: No Transportation Needs  . Lack of Transportation (Medical): No  . Lack of Transportation (Non-Medical): No  Physical Activity: Inactive  . Days of Exercise per Week: 0 days  . Minutes of Exercise per Session: 0 min  Stress: No Stress Concern Present  . Feeling of Stress : Not at all  Social Connections: Moderately Isolated  . Frequency of Communication with Friends and Family: More than three times a week  . Frequency of Social Gatherings with Friends and Family: Once a week  . Attends Religious Services: More than 4 times per year  . Active Member of Clubs or Organizations: No  . Attends Archivist Meetings: Never  . Marital  Status: Never married  Intimate Partner Violence: Not on file   Family History  Problem Relation Age of Onset  . Depression Mother   . Diabetes Mother   . Heart disease Father   . Cancer - Prostate Brother   . Cancer Brother   . Cancer Sister       VITAL SIGNS BP 121/76   Pulse 82   Temp 98.2 F (36.8 C)   Resp 20   Ht 5' 9" (1.753 m)   Wt 120 lb (54.4 kg)   BMI 17.72 kg/m   Facility-Administered Encounter Medications as of 05/02/2021  Medication  . prochlorperazine (COMPAZINE) tablet 10 mg   Outpatient Encounter Medications as of 05/02/2021  Medication Sig  . acetaminophen (TYLENOL) 500 MG tablet Take 1,000 mg by mouth every 6 (six) hours as needed for mild pain.  Marland Kitchen acyclovir (ZOVIRAX)  400 MG tablet TAKE 1 TABLET BY MOUTH TWICE DAILY  . alendronate (FOSAMAX) 70 MG tablet   . amLODipine (NORVASC) 5 MG tablet   . aspirin EC 81 MG tablet Take 81 mg by mouth daily.  . Calcium Carbonate-Vitamin D (CALCIUM-VITAMIN D3 PO) Take 400 Units by mouth daily in the afternoon.  . cholestyramine (QUESTRAN) 4 g packet Take 4 g by mouth 2 (two) times daily between meals.  . donepezil (ARICEPT) 5 MG tablet   . insulin aspart (NOVOLOG) 100 UNIT/ML FlexPen   . Lactobacillus Probiotic TABS Take 2 tablets by mouth daily.  Marland Kitchen lenalidomide (REVLIMID) 10 MG capsule   . levothyroxine (SYNTHROID, LEVOTHROID) 25 MCG tablet Take 25 mcg by mouth daily before breakfast.  . lipase/protease/amylase (CREON) 36000 UNITS CPEP capsule Take 36,000 Units by mouth 2 (two) times daily. With snacks between meals at 10 am and 8 pm  . lipase/protease/amylase (CREON) 36000 UNITS CPEP capsule Take 72,000 Units by mouth 3 (three) times daily with meals. 8 am, 12 pm, and 6 pm  . loperamide (IMODIUM A-D) 2 MG tablet Take 4 mg by mouth 3 (three) times daily. For diarrhea  . losartan (COZAAR) 50 MG tablet Take 1 tablet (50 mg total) by mouth daily.  . Multiple Vitamins-Minerals (MULTIVITAMIN WOMEN 50+ PO) Take by mouth daily.   . NON FORMULARY Regular diet no dairy products.  . NON FORMULARY Wanderguard #[id number] to ankle for safety awareness. Check placement and function qshift. Special Instructions: Check placement and function qshift. Every Shift Day, Evening, Night  . potassium chloride SA (KLOR-CON) 20 MEQ tablet Take 20 mEq by mouth daily.      SIGNIFICANT DIAGNOSTIC EXAMS   PREVIOUS   08-10-20: t score: -3.758  12-21-20: KUBThere is a mild ileus noted. Mild to moderate increase feces in the colon. No renal stone is seen  12-25-20: KUB  There is a nonspecific bowel gas pattern without obstruction. No residual ileus is noted. Mild increased feces in the colon. No renal stone is seen.  02-05-21: ct of head:  1. No  acute intracranial abnormality. Please note if concern for acute infarction MRI would be more sensitive. 2. Similar appearance of bifrontal encephalomalacia consistent with prior infarction/insult  02-05-21: MRI of brain:  No evidence of recent infarction, hemorrhage, or mass. Bifrontal encephalomalacia/gliosis. Additional chronic microvascular ischemic changes. Questioned small left anterior clinoid process calcified meningioma on prior CT imaging is not well evaluated due to size and lack of contrast.  NO NEW EXAMS.     LABS REVIEWED PREVIOUS    07-20-20: vit B 12: 620  07-23-20: tsh 2.264 07-24-20: wbc 3.9;  hgb 9.3; hct 29.3 mcv 105.4 plt 105; glucose 106; bun 25; creat 1.28; k+ 3.6; na++ 142; a 9.2 liver normal albumin 3.5  07-30-20: chol 116; ldl 49; trig 71; hdl 53; hgb a1c 5.1; hep C neg  08-07-20: wbc 3.5; hgb 9.0; hct 27.9; mcv 104.5 plt 112; glucose 86; bun 27; creat 1.22; k+ 4.2; an++ 143; ca 9.0 liver normal albumin 3.2 08-14-20: wbc 2.8; hgb 9.4; hct 29.2; mcv 104.3 plt 101; glucose 71; bun 27; creat 1.41; k+ 4.3; na++ 140; ca 9.0 liver normal albumin 3.1  08-16-20: urine micro-albumin 9.3 08-21-20: wbc 3.9; hgb 9.5; hct 29.7 mcv 104.2 plt 86; glucose 86; bun 18; creat 1.09; k+ 3.7; na++ 144; ca 8.1; liver normal albumin 3.1  09-18-20: wbc 4.4; hgb 9.6; hct 29.7 mcv 102.4 plt 105; glucose 99; bun 20; creat 1.12; k+ 3.2; na++ 142; ca 8.8 liver normal albumin 3.2  10-16-20: wbc 3.9; hgb 10.2; hct 32.3; mcv 104.9 plt 95; glucose 98; bun 19; creat 1.25; k+ 3.9; na++ 139; ca 8.8 liver normal albumin 3.2  12-04-20: wbc 3.1; hgb 10.9; hct 34.6; mcv 103.0 plt 130; glucose 132; bun 27; creat 1.35; k+ 3.6; na++ 143; ca 9.3 liver normal albumin 3.5  12-25-20: wbc 2.7; hgb 10.1; hct 31.2 mcv 103.0 plt 120; glucose 108; bun 13; creat 1.53; k+ 3.9; na++ 138; ca 8.7 liver normal albumin 3.2 GFR 3 01-01-21: wbc 2.5; hgb 10.6; hct 32.1; mcv 100.6 plt 87; glucose 87; bun 27; creat 1.24; k+ 3.8; na++ 144; ca 9.3  GFR 45; liver normal albumin 3.4  02-05-21: wbc 3.9; hgb 10.2; hct 31.6; mcv 102.9 plt 91; glucose 118; bun 26; creat 1.29; k+ 3.8; na++ 138; ca 8.7 GFR43 liver normal albumin 3.0 urine culture: e-coli 02-07-21: hgb a1c 5.3 04-08-21: chol 142; ldl 66; trig 101; hdl 56 04-09-21: wbc 4.6; hgb 11.3; hct 35.3; mcv 100.6 plt 140; glucose 101; bun 20; creat 1.13; k+ 3.5; na++ 139; ca 8.5 GFR 50; liver normal albumin 3.4   NO NEW LABS.   Review of Systems  Constitutional: Negative for malaise/fatigue.  Respiratory: Negative for cough and shortness of breath.   Cardiovascular: Negative for chest pain, palpitations and leg swelling.  Gastrointestinal: Negative for abdominal pain, constipation and heartburn.  Musculoskeletal: Negative for back pain, joint pain and myalgias.  Skin: Negative.   Neurological: Negative for dizziness.  Psychiatric/Behavioral: The patient is not nervous/anxious.     Physical Exam Constitutional:      General: She is not in acute distress.    Appearance: She is cachectic. She is not diaphoretic.  Neck:     Thyroid: No thyromegaly.  Cardiovascular:     Rate and Rhythm: Normal rate and regular rhythm.     Pulses: Normal pulses.     Heart sounds: Normal heart sounds.  Pulmonary:     Effort: Pulmonary effort is normal. No respiratory distress.     Breath sounds: Normal breath sounds.  Abdominal:     General: Bowel sounds are normal. There is no distension.     Palpations: Abdomen is soft.     Tenderness: There is no abdominal tenderness.  Musculoskeletal:        General: Normal range of motion.     Cervical back: Neck supple.     Right lower leg: No edema.     Left lower leg: No edema.  Lymphadenopathy:     Cervical: No cervical adenopathy.  Skin:    General: Skin is warm and  dry.  Neurological:     Mental Status: She is alert. Mental status is at baseline.  Psychiatric:        Mood and Affect: Mood normal.       ASSESSMENT/ PLAN:  TODAY  1. Adult  failure to thrive 2. Aortic atherosclerosis 3. Vascular dementia with behavioral disturbance 4. Multiple myeloma not in remission.  Will continue current plan of care Will change the following medications: Will continue to monitor her status.   Time spent with patient: 45 minutes: we have discussed advanced directives; she has is to be a DNR.  coordination of care: behaviors; medications goals of care.    Ok Edwards NP [facility]  Contact [phone] Monday through Friday 8am- 5pm  After hours call [phone]

## 2021-05-02 NOTE — Progress Notes (Signed)
Location:  [place] Room Number: 148-W Place of Service:  SNF (31)   CODE STATUS: Full code  Allergies  Allergen Reactions  . Lotensin [Benazepril]     Chief Complaint  Patient presents with  . Acute Visit    Care planning meeting    HPI:    Past Medical History:  Diagnosis Date  . Benign hypertension   . Cataract   . 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 Diabetes mellitus without complication ([place])   . H/O partial nephrectomy   . Hypokalemia   . Hypothyroidism   . Impaired cognition   . Multiple myeloma (HCC)    Dr Maylon Peppers, [facility]  . Thyroid disease     Past Surgical History:  Procedure Laterality Date  . ABDOMINAL HYSTERECTOMY    . CRANIOTOMY     for lymphoma  . YAG LASER APPLICATION Right 04/06/5360   Procedure: YAG LASER APPLICATION;  Surgeon: [name], MD;  Location: [facility];  Service: Ophthalmology;  Laterality: Right;    Social History   Socioeconomic History  . Marital status: Single    Spouse name: Not on file  . Number of children: Not on file  . Years of education: Not on file  . Highest education level: Not on file  Occupational History  . Occupation: retired   Tobacco Use  . Smoking status: Never Smoker  . Smokeless tobacco: Never Used  Vaping Use  . Vaping Use: Never used  Substance and Sexual Activity  . Alcohol use: No  . Drug use: No  . Sexual activity: Never  Other Topics Concern  . Not on file  Social History Narrative   Long term resident of [facility]    Social Determinants of Health   Financial Resource Strain: Low Risk   . Difficulty of Paying Living Expenses: Not very hard  Food Insecurity: No Food Insecurity  . Worried About Charity fundraiser in the Last Year: Never true  . Ran Out of Food in the Last Year: Never true  Transportation Needs: No Transportation Needs  . Lack of Transportation (Medical): No  . Lack of Transportation (Non-Medical): No  Physical Activity: Inactive  . Days  of Exercise per Week: 0 days  . Minutes of Exercise per Session: 0 min  Stress: No Stress Concern Present  . Feeling of Stress : Not at all  Social Connections: Moderately Isolated  . Frequency of Communication with Friends and Family: More than three times a week  . Frequency of Social Gatherings with Friends and Family: Once a week  . Attends Religious Services: More than 4 times per year  . Active Member of Clubs or Organizations: No  . Attends Archivist Meetings: Never  . Marital Status: Never married  Intimate Partner Violence: Not on file   Family History  Problem Relation Age of Onset  . Depression Mother   . Diabetes Mother   . Heart disease Father   . Cancer - Prostate Brother   . Cancer Brother   . Cancer Sister       VITAL SIGNS BP 121/76   Pulse 82   Temp 98.2 F (36.8 C)   Resp 20   Ht '5\' 9"'  (1.753 m)   Wt 120 lb (54.4 kg)   SpO2 97%   BMI 17.72 kg/m   Facility-Administered Encounter Medications as of 05/02/2021  Medication  . prochlorperazine (COMPAZINE) tablet 10 mg   Outpatient Encounter Medications as of 05/02/2021  Medication Sig  . acetaminophen (TYLENOL) 500 MG tablet Take 1,000 mg by mouth every 6 (six) hours as needed for mild pain.  Marland Kitchen acyclovir (ZOVIRAX) 400 MG tablet TAKE 1 TABLET BY MOUTH TWICE DAILY  . aspirin EC 81 MG tablet Take 81 mg by mouth daily.  . Calcium Carbonate-Vitamin D (CALCIUM-VITAMIN D3 PO) Take 400 Units by mouth daily in the afternoon.  . cholestyramine (QUESTRAN) 4 g packet Take 4 g by mouth 2 (two) times daily between meals.  . Lactobacillus Probiotic TABS Take 2 tablets by mouth daily.  Marland Kitchen levothyroxine (SYNTHROID, LEVOTHROID) 25 MCG tablet Take 25 mcg by mouth daily before breakfast.  . lipase/protease/amylase (CREON) 36000 UNITS CPEP capsule Take 36,000 Units by mouth 2 (two) times daily. With snacks between meals at 10 am and 8 pm  . lipase/protease/amylase (CREON) 36000 UNITS CPEP capsule Take 72,000 Units  by mouth 3 (three) times daily with meals. 8 am, 12 pm, and 6 pm  . loperamide (IMODIUM A-D) 2 MG tablet Take 4 mg by mouth 3 (three) times daily. For diarrhea  . losartan (COZAAR) 50 MG tablet Take 1 tablet (50 mg total) by mouth daily.  . Multiple Vitamins-Minerals (MULTIVITAMIN WOMEN 50+ PO) Take by mouth daily.   . NON FORMULARY Regular diet no dairy products.  . NON FORMULARY Wanderguard #[id number] to ankle for safety awareness. Check placement and function qshift. Special Instructions: Check placement and function qshift. Every Shift Day, Evening, Night  . potassium chloride SA (KLOR-CON) 20 MEQ tablet Take 20 mEq by mouth daily.   . [DISCONTINUED] alendronate (FOSAMAX) 70 MG tablet  (Patient not taking: Reported on 05/02/2021)  . [DISCONTINUED] amLODipine (NORVASC) 5 MG tablet  (Patient not taking: Reported on 05/02/2021)  . [DISCONTINUED] donepezil (ARICEPT) 5 MG tablet  (Patient not taking: Reported on 05/02/2021)  . [DISCONTINUED] insulin aspart (NOVOLOG) 100 UNIT/ML FlexPen  (Patient not taking: Reported on 05/02/2021)  . [DISCONTINUED] lenalidomide (REVLIMID) 10 MG capsule      SIGNIFICANT DIAGNOSTIC EXAMS       ASSESSMENT/ PLAN:     Ok Edwards NP [facility]  Contact [phone] Monday through Friday 8am- 5pm  After hours call [phone]

## 2021-05-03 ENCOUNTER — Non-Acute Institutional Stay (SKILLED_NURSING_FACILITY): Payer: Medicare PPO | Admitting: Adult Health

## 2021-05-03 ENCOUNTER — Encounter: Payer: Self-pay | Admitting: Adult Health

## 2021-05-03 DIAGNOSIS — F419 Anxiety disorder, unspecified: Secondary | ICD-10-CM

## 2021-05-03 DIAGNOSIS — F0151 Vascular dementia with behavioral disturbance: Secondary | ICD-10-CM

## 2021-05-03 DIAGNOSIS — F01518 Vascular dementia, unspecified severity, with other behavioral disturbance: Secondary | ICD-10-CM

## 2021-05-03 NOTE — Progress Notes (Signed)
Location:  [place] Room Number: 148-W Place of Service:  SNF (31)   CODE STATUS: DNR  Allergies  Allergen Reactions  . Lotensin [Benazepril]     Chief Complaint  Patient presents with  . Acute Visit    Increased anxiety.    HPI:  Since her sister was in the hospital; she has been more anxious. She requires constant supervision she is restless; has increased wandering. The staff is worried about her level of anxiety; and the impact this is having on her quality of life.   Past Medical History:  Diagnosis Date  . Benign hypertension   . Cataract   . 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 Diabetes mellitus without complication ([place])   . H/O partial nephrectomy   . Hypokalemia   . Hypothyroidism   . Impaired cognition   . Multiple myeloma (HCC)    Dr Maylon Peppers, [facility]  . Thyroid disease     Past Surgical History:  Procedure Laterality Date  . ABDOMINAL HYSTERECTOMY    . CRANIOTOMY     for lymphoma  . YAG LASER APPLICATION Right 7/54/4920   Procedure: YAG LASER APPLICATION;  Surgeon: [name], MD;  Location: [facility];  Service: Ophthalmology;  Laterality: Right;    Social History   Socioeconomic History  . Marital status: Single    Spouse name: Not on file  . Number of children: Not on file  . Years of education: Not on file  . Highest education level: Not on file  Occupational History  . Occupation: retired   Tobacco Use  . Smoking status: Never Smoker  . Smokeless tobacco: Never Used  Vaping Use  . Vaping Use: Never used  Substance and Sexual Activity  . Alcohol use: No  . Drug use: No  . Sexual activity: Never  Other Topics Concern  . Not on file  Social History Narrative   Long term resident of [facility]    Social Determinants of Health   Financial Resource Strain: Low Risk   . Difficulty of Paying Living Expenses: Not very hard  Food Insecurity: No Food Insecurity  . Worried About Charity fundraiser in the Last  Year: Never true  . Ran Out of Food in the Last Year: Never true  Transportation Needs: No Transportation Needs  . Lack of Transportation (Medical): No  . Lack of Transportation (Non-Medical): No  Physical Activity: Inactive  . Days of Exercise per Week: 0 days  . Minutes of Exercise per Session: 0 min  Stress: No Stress Concern Present  . Feeling of Stress : Not at all  Social Connections: Moderately Isolated  . Frequency of Communication with Friends and Family: More than three times a week  . Frequency of Social Gatherings with Friends and Family: Once a week  . Attends Religious Services: More than 4 times per year  . Active Member of Clubs or Organizations: No  . Attends Archivist Meetings: Never  . Marital Status: Never married  Intimate Partner Violence: Not on file   Family History  Problem Relation Age of Onset  . Depression Mother   . Diabetes Mother   . Heart disease Father   . Cancer - Prostate Brother   . Cancer Brother   . Cancer Sister       VITAL SIGNS BP 121/76   Pulse 82   Temp 98.2 F (36.8 C)   Resp 20   Ht '5\' 9"'  (1.753 m)  Wt 120 lb (54.4 kg)   SpO2 97%   BMI 17.72 kg/m   Facility-Administered Encounter Medications as of 05/03/2021  Medication  . prochlorperazine (COMPAZINE) tablet 10 mg   Outpatient Encounter Medications as of 05/03/2021  Medication Sig  . acetaminophen (TYLENOL) 500 MG tablet Take 1,000 mg by mouth every 6 (six) hours as needed for mild pain.  Marland Kitchen acyclovir (ZOVIRAX) 400 MG tablet TAKE 1 TABLET BY MOUTH TWICE DAILY  . aspirin EC 81 MG tablet Take 81 mg by mouth daily.  . Calcium Carbonate-Vitamin D (CALCIUM-VITAMIN D3 PO) Take 400 Units by mouth daily in the afternoon.  . cholestyramine (QUESTRAN) 4 g packet Take 4 g by mouth 2 (two) times daily between meals.  . Lactobacillus Probiotic TABS Take 2 tablets by mouth daily.  Marland Kitchen levothyroxine (SYNTHROID, LEVOTHROID) 25 MCG tablet Take 25 mcg by mouth daily before  breakfast.  . lipase/protease/amylase (CREON) 36000 UNITS CPEP capsule Take 36,000 Units by mouth 2 (two) times daily. With snacks between meals at 10 am and 8 pm  . lipase/protease/amylase (CREON) 36000 UNITS CPEP capsule Take 72,000 Units by mouth 3 (three) times daily with meals. 8 am, 12 pm, and 6 pm  . loperamide (IMODIUM A-D) 2 MG tablet Take 4 mg by mouth 3 (three) times daily. For diarrhea  . losartan (COZAAR) 50 MG tablet Take 1 tablet (50 mg total) by mouth daily.  . Multiple Vitamins-Minerals (MULTIVITAMIN WOMEN 50+ PO) Take by mouth daily.   . NON FORMULARY Regular diet no dairy products.  . NON FORMULARY Wanderguard #3154 to ankle for safety awareness. Check placement and function qshift. Special Instructions: Check placement and function qshift. Every Shift Day, Evening, Night  . potassium chloride SA (KLOR-CON) 20 MEQ tablet Take 20 mEq by mouth daily.      SIGNIFICANT DIAGNOSTIC EXAMS   PREVIOUS   08-10-20: t score: -3.758  12-21-20: KUBThere is a mild ileus noted. Mild to moderate increase feces in the colon. No renal stone is seen  12-25-20: KUB  There is a nonspecific bowel gas pattern without obstruction. No residual ileus is noted. Mild increased feces in the colon. No renal stone is seen.  02-05-21: ct of head:  1. No acute intracranial abnormality. Please note if concern for acute infarction MRI would be more sensitive. 2. Similar appearance of bifrontal encephalomalacia consistent with prior infarction/insult  02-05-21: MRI of brain:  No evidence of recent infarction, hemorrhage, or mass. Bifrontal encephalomalacia/gliosis. Additional chronic microvascular ischemic changes. Questioned small left anterior clinoid process calcified meningioma on prior CT imaging is not well evaluated due to size and lack of contrast.  NO NEW EXAMS.     LABS REVIEWED PREVIOUS    07-20-20: vit B 12: 620  07-23-20: tsh 2.264 07-24-20: wbc 3.9; hgb 9.3; hct 29.3 mcv 105.4 plt 105;  glucose 106; bun 25; creat 1.28; k+ 3.6; na++ 142; a 9.2 liver normal albumin 3.5  07-30-20: chol 116; ldl 49; trig 71; hdl 53; hgb a1c 5.1; hep C neg  08-07-20: wbc 3.5; hgb 9.0; hct 27.9; mcv 104.5 plt 112; glucose 86; bun 27; creat 1.22; k+ 4.2; an++ 143; ca 9.0 liver normal albumin 3.2 08-14-20: wbc 2.8; hgb 9.4; hct 29.2; mcv 104.3 plt 101; glucose 71; bun 27; creat 1.41; k+ 4.3; na++ 140; ca 9.0 liver normal albumin 3.1  08-16-20: urine micro-albumin 9.3 08-21-20: wbc 3.9; hgb 9.5; hct 29.7 mcv 104.2 plt 86; glucose 86; bun 18; creat 1.09; k+ 3.7; na++ 144; ca 8.1;  liver normal albumin 3.1  09-18-20: wbc 4.4; hgb 9.6; hct 29.7 mcv 102.4 plt 105; glucose 99; bun 20; creat 1.12; k+ 3.2; na++ 142; ca 8.8 liver normal albumin 3.2  10-16-20: wbc 3.9; hgb 10.2; hct 32.3; mcv 104.9 plt 95; glucose 98; bun 19; creat 1.25; k+ 3.9; na++ 139; ca 8.8 liver normal albumin 3.2  12-04-20: wbc 3.1; hgb 10.9; hct 34.6; mcv 103.0 plt 130; glucose 132; bun 27; creat 1.35; k+ 3.6; na++ 143; ca 9.3 liver normal albumin 3.5  12-25-20: wbc 2.7; hgb 10.1; hct 31.2 mcv 103.0 plt 120; glucose 108; bun 13; creat 1.53; k+ 3.9; na++ 138; ca 8.7 liver normal albumin 3.2 GFR 3 01-01-21: wbc 2.5; hgb 10.6; hct 32.1; mcv 100.6 plt 87; glucose 87; bun 27; creat 1.24; k+ 3.8; na++ 144; ca 9.3 GFR 45; liver normal albumin 3.4  02-05-21: wbc 3.9; hgb 10.2; hct 31.6; mcv 102.9 plt 91; glucose 118; bun 26; creat 1.29; k+ 3.8; na++ 138; ca 8.7 GFR43 liver normal albumin 3.0 urine culture: e-coli 02-07-21: hgb a1c 5.3 04-08-21: chol 142; ldl 66; trig 101; hdl 56 04-09-21: wbc 4.6; hgb 11.3; hct 35.3; mcv 100.6 plt 140; glucose 101; bun 20; creat 1.13; k+ 3.5; na++ 139; ca 8.5 GFR 50; liver normal albumin 3.4   NO NEW LABS.    Review of Systems  Constitutional: Negative for malaise/fatigue.  Respiratory: Negative for cough and shortness of breath.   Cardiovascular: Negative for chest pain, palpitations and leg swelling.  Gastrointestinal:  Negative for abdominal pain, constipation and heartburn.  Musculoskeletal: Negative for back pain, joint pain and myalgias.  Skin: Negative.   Neurological: Negative for dizziness.  Psychiatric/Behavioral: The patient is not nervous/anxious.     Physical Exam Constitutional:      General: She is not in acute distress.    Appearance: She is cachectic. She is not diaphoretic.  Neck:     Thyroid: No thyromegaly.  Cardiovascular:     Rate and Rhythm: Normal rate and regular rhythm.     Pulses: Normal pulses.     Heart sounds: Normal heart sounds.  Pulmonary:     Effort: Pulmonary effort is normal. No respiratory distress.     Breath sounds: Normal breath sounds.  Abdominal:     General: Bowel sounds are normal. There is no distension.     Palpations: Abdomen is soft.     Tenderness: There is no abdominal tenderness.  Musculoskeletal:        General: Normal range of motion.     Cervical back: Neck supple.     Right lower leg: No edema.     Left lower leg: No edema.  Lymphadenopathy:     Cervical: No cervical adenopathy.  Skin:    General: Skin is warm and dry.  Neurological:     Mental Status: She is alert. Mental status is at baseline.  Psychiatric:        Mood and Affect: Mood normal.      ASSESSMENT/ PLAN:  TODAY  1. Vascular dementia with behavioral disturbance 2. Anxiety:   Will begin her on buspar 5 mg twice daily  Will monitor her status.    Ok Edwards NP [facility]  Contact [phone] Monday through Friday 8am- 5pm  After hours call [phone]

## 2021-05-03 NOTE — Progress Notes (Signed)
This encounter was created in error - please disregard.

## 2021-05-07 ENCOUNTER — Inpatient Hospital Stay (HOSPITAL_COMMUNITY): Payer: Medicare PPO

## 2021-05-07 VITALS — BP 167/91 | HR 79 | Temp 96.9°F | Resp 18 | Wt 119.7 lb

## 2021-05-07 DIAGNOSIS — R197 Diarrhea, unspecified: Secondary | ICD-10-CM | POA: Diagnosis not present

## 2021-05-07 DIAGNOSIS — C9000 Multiple myeloma not having achieved remission: Secondary | ICD-10-CM

## 2021-05-07 DIAGNOSIS — E119 Type 2 diabetes mellitus without complications: Secondary | ICD-10-CM | POA: Diagnosis not present

## 2021-05-07 DIAGNOSIS — D631 Anemia in chronic kidney disease: Secondary | ICD-10-CM | POA: Diagnosis not present

## 2021-05-07 DIAGNOSIS — F0390 Unspecified dementia without behavioral disturbance: Secondary | ICD-10-CM | POA: Diagnosis not present

## 2021-05-07 DIAGNOSIS — I10 Essential (primary) hypertension: Secondary | ICD-10-CM | POA: Diagnosis not present

## 2021-05-07 DIAGNOSIS — F419 Anxiety disorder, unspecified: Secondary | ICD-10-CM | POA: Insufficient documentation

## 2021-05-07 DIAGNOSIS — E039 Hypothyroidism, unspecified: Secondary | ICD-10-CM | POA: Diagnosis not present

## 2021-05-07 DIAGNOSIS — Z5111 Encounter for antineoplastic chemotherapy: Secondary | ICD-10-CM | POA: Diagnosis not present

## 2021-05-07 DIAGNOSIS — N189 Chronic kidney disease, unspecified: Secondary | ICD-10-CM | POA: Diagnosis not present

## 2021-05-07 LAB — COMPREHENSIVE METABOLIC PANEL
ALT: 24 U/L (ref 0–44)
AST: 31 U/L (ref 15–41)
Albumin: 3.7 g/dL (ref 3.5–5.0)
Alkaline Phosphatase: 81 U/L (ref 38–126)
Anion gap: 8 (ref 5–15)
BUN: 21 mg/dL (ref 8–23)
CO2: 27 mmol/L (ref 22–32)
Calcium: 9.2 mg/dL (ref 8.9–10.3)
Chloride: 104 mmol/L (ref 98–111)
Creatinine, Ser: 1.12 mg/dL — ABNORMAL HIGH (ref 0.44–1.00)
GFR, Estimated: 51 mL/min — ABNORMAL LOW (ref >60)
Glucose, Bld: 111 mg/dL — ABNORMAL HIGH (ref 70–99)
Potassium: 3.6 mmol/L (ref 3.5–5.1)
Sodium: 139 mmol/L (ref 135–145)
Total Bilirubin: 0.6 mg/dL (ref 0.3–1.2)
Total Protein: 7.2 g/dL (ref 6.5–8.1)

## 2021-05-07 LAB — CBC WITH DIFFERENTIAL/PLATELET
Abs Immature Granulocytes: 0.01 10*3/uL (ref 0.00–0.07)
Basophils Absolute: 0 10*3/uL (ref 0.0–0.1)
Basophils Relative: 1 %
Eosinophils Absolute: 0.1 10*3/uL (ref 0.0–0.5)
Eosinophils Relative: 2 %
HCT: 36.7 % (ref 36.0–46.0)
Hemoglobin: 11.7 g/dL — ABNORMAL LOW (ref 12.0–15.0)
Immature Granulocytes: 0 %
Lymphocytes Relative: 17 %
Lymphs Abs: 0.8 10*3/uL (ref 0.7–4.0)
MCH: 31.5 pg (ref 26.0–34.0)
MCHC: 31.9 g/dL (ref 30.0–36.0)
MCV: 98.7 fL (ref 80.0–100.0)
Monocytes Absolute: 0.6 10*3/uL (ref 0.1–1.0)
Monocytes Relative: 13 %
Neutro Abs: 3.4 10*3/uL (ref 1.7–7.7)
Neutrophils Relative %: 67 %
Platelets: 144 10*3/uL — ABNORMAL LOW (ref 150–400)
RBC: 3.72 MIL/uL — ABNORMAL LOW (ref 3.87–5.11)
RDW: 15.2 % (ref 11.5–15.5)
WBC: 5 10*3/uL (ref 4.0–10.5)
nRBC: 0 % (ref 0.0–0.2)

## 2021-05-07 LAB — LACTATE DEHYDROGENASE: LDH: 230 U/L — ABNORMAL HIGH (ref 98–192)

## 2021-05-07 MED ORDER — BORTEZOMIB CHEMO SQ INJECTION 3.5 MG (2.5MG/ML)
1.3000 mg/m2 | Freq: Once | INTRAMUSCULAR | Status: AC
  Administered 2021-05-07: 2 mg via SUBCUTANEOUS
  Filled 2021-05-07: qty 0.8

## 2021-05-07 MED ORDER — DEXAMETHASONE 4 MG PO TABS
10.0000 mg | ORAL_TABLET | Freq: Once | ORAL | Status: AC
  Administered 2021-05-07: 10 mg via ORAL

## 2021-05-07 MED ORDER — DEXAMETHASONE 4 MG PO TABS
ORAL_TABLET | ORAL | Status: AC
  Filled 2021-05-07: qty 3

## 2021-05-07 NOTE — Patient Instructions (Signed)
[place]  Discharge Instructions: Thank you for choosing [facility] to provide your oncology and hematology care.  If you have a lab appointment with the [facility], please come in thru the Main Entrance and check in at the main information desk.  Wear comfortable clothing and clothing appropriate for easy access to any Portacath or PICC line.   We strive to give you quality time with your provider. You may need to reschedule your appointment if you arrive late (15 or more minutes).  Arriving late affects you and other patients whose appointments are after yours.  Also, if you miss three or more appointments without notifying the office, you may be dismissed from the clinic at the provider's discretion.      For prescription refill requests, have your pharmacy contact our office and allow 72 hours for refills to be completed.    Today you received Velcade injection    To help prevent nausea and vomiting after your treatment, we encourage you to take your nausea medication as directed.  BELOW ARE SYMPTOMS THAT SHOULD BE REPORTED IMMEDIATELY: . *FEVER GREATER THAN 100.4 F (38 C) OR HIGHER . *CHILLS OR SWEATING . *NAUSEA AND VOMITING THAT IS NOT CONTROLLED WITH YOUR NAUSEA MEDICATION . *UNUSUAL SHORTNESS OF BREATH . *UNUSUAL BRUISING OR BLEEDING . *URINARY PROBLEMS (pain or burning when urinating, or frequent urination) . *BOWEL PROBLEMS (unusual diarrhea, constipation, pain near the anus) . TENDERNESS IN MOUTH AND THROAT WITH OR WITHOUT PRESENCE OF ULCERS (sore throat, sores in mouth, or a toothache) . UNUSUAL RASH, SWELLING OR PAIN  . UNUSUAL VAGINAL DISCHARGE OR ITCHING   Items with * indicate a potential emergency and should be followed up as soon as possible or go to the Emergency Department if any problems should occur.  Please show the CHEMOTHERAPY ALERT CARD or IMMUNOTHERAPY ALERT CARD at check-in to the Emergency Department and triage  nurse.  Should you have questions after your visit or need to cancel or reschedule your appointment, please contact [facility] [phone]  and follow the prompts.  Office hours are 8:00 a.m. to 4:30 p.m. Monday - Friday. Please note that voicemails left after 4:00 p.m. may not be returned until the following business day.  We are closed weekends and major holidays. You have access to a nurse at all times for urgent questions. Please call the main number to the clinic [phone] and follow the prompts.  For any non-urgent questions, you may also contact your provider using MyChart. We now offer e-Visits for anyone 68 and older to request care online for non-urgent symptoms. For details visit [email or web address].   Also download the MyChart app! Go to the app store, search "MyChart", open the app, select [facility], and log in with your MyChart username and password.  Due to Covid, a mask is required upon entering the hospital/clinic. If you do not have a mask, one will be given to you upon arrival. For doctor visits, patients may have 1 support person aged 15 or older with them. For treatment visits, patients cannot have anyone with them due to current Covid guidelines and our immunocompromised population.

## 2021-05-07 NOTE — Progress Notes (Signed)
Patient presents today for Velcade injection.  Vital signs within parameters for treatment.  Labs pending.  Patient has no new complaints at this time.  Labs within parameters for treatment.  Velcade injection given today per MD orders.  Stable during infusion without adverse affects.  Vital signs stable.  No complaints at this time.  Discharge from clinic via wheelchair in stable condition.  Alert and oriented X 3.  Follow up with [name] as scheduled.

## 2021-05-08 LAB — KAPPA/LAMBDA LIGHT CHAINS
Kappa free light chain: 17.3 mg/L (ref 3.3–19.4)
Kappa, lambda light chain ratio: 1 (ref 0.26–1.65)
Lambda free light chains: 17.3 mg/L (ref 5.7–26.3)

## 2021-05-09 LAB — PROTEIN ELECTROPHORESIS, SERUM
A/G Ratio: 1.2 (ref 0.7–1.7)
Albumin ELP: 3.5 g/dL (ref 2.9–4.4)
Alpha-1-Globulin: 0.2 g/dL (ref 0.0–0.4)
Alpha-2-Globulin: 0.7 g/dL (ref 0.4–1.0)
Beta Globulin: 0.8 g/dL (ref 0.7–1.3)
Gamma Globulin: 1.1 g/dL (ref 0.4–1.8)
Globulin, Total: 2.9 g/dL (ref 2.2–3.9)
M-Spike, %: 0.4 g/dL — ABNORMAL HIGH
Total Protein ELP: 6.4 g/dL (ref 6.0–8.5)

## 2021-05-10 LAB — IMMUNOFIXATION ELECTROPHORESIS
IgA: 96 mg/dL (ref 64–422)
IgG (Immunoglobin G), Serum: 1390 mg/dL (ref 586–1602)
IgM (Immunoglobulin M), Srm: 15 mg/dL — ABNORMAL LOW (ref 26–217)
Total Protein ELP: 6.7 g/dL (ref 6.0–8.5)

## 2021-05-14 ENCOUNTER — Encounter: Payer: Self-pay | Admitting: Adult Health

## 2021-05-14 ENCOUNTER — Non-Acute Institutional Stay (SKILLED_NURSING_FACILITY): Payer: Medicare PPO | Admitting: Adult Health

## 2021-05-14 DIAGNOSIS — D696 Thrombocytopenia, unspecified: Secondary | ICD-10-CM | POA: Diagnosis not present

## 2021-05-14 DIAGNOSIS — I700 Atherosclerosis of aorta: Secondary | ICD-10-CM

## 2021-05-14 DIAGNOSIS — C9000 Multiple myeloma not having achieved remission: Secondary | ICD-10-CM

## 2021-05-14 DIAGNOSIS — I440 Atrioventricular block, first degree: Secondary | ICD-10-CM

## 2021-05-14 NOTE — Progress Notes (Signed)
Location:  [place] Room Number: [place] of Service:  SNF (31)   CODE STATUS: DNR  Allergies  Allergen Reactions  . Lotensin [Benazepril]     Chief Complaint  Patient presents with  . Medical Management of Chronic Issues           Thrombocytopenia:    Multiple myeloma without achieving remission:   Aortic atherosclerosis:   First degree av block/bradycardia    HPI:  She is a 75 year old long term resident of this facility being seen for the management of her chronic illnesses:Thrombocytopenia:    Multiple myeloma without achieving remission:   Aortic atherosclerosis:   First degree av block/bradycardia. There are no reports of uncontrolled pain. She is less anxious since starting on buspar; and her sister's return.   Past Medical History:  Diagnosis Date  . Benign hypertension   . Cataract   . 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 Diabetes mellitus without complication ([place])   . H/O partial nephrectomy   . Hypokalemia   . Hypothyroidism   . Impaired cognition   . Multiple myeloma (HCC)    Dr Maylon Peppers, [facility]  . Thyroid disease     Past Surgical History:  Procedure Laterality Date  . ABDOMINAL HYSTERECTOMY    . CRANIOTOMY     for lymphoma  . YAG LASER APPLICATION Right 2/99/2426   Procedure: YAG LASER APPLICATION;  Surgeon: [name], MD;  Location: [facility];  Service: Ophthalmology;  Laterality: Right;    Social History   Socioeconomic History  . Marital status: Single    Spouse name: Not on file  . Number of children: Not on file  . Years of education: Not on file  . Highest education level: Not on file  Occupational History  . Occupation: retired   Tobacco Use  . Smoking status: Never Smoker  . Smokeless tobacco: Never Used  Vaping Use  . Vaping Use: Never used  Substance and Sexual Activity  . Alcohol use: No  . Drug use: No  . Sexual activity: Never  Other Topics Concern  . Not on file  Social History  Narrative   Long term resident of [facility]    Social Determinants of Health   Financial Resource Strain: Low Risk   . Difficulty of Paying Living Expenses: Not very hard  Food Insecurity: No Food Insecurity  . Worried About Charity fundraiser in the Last Year: Never true  . Ran Out of Food in the Last Year: Never true  Transportation Needs: No Transportation Needs  . Lack of Transportation (Medical): No  . Lack of Transportation (Non-Medical): No  Physical Activity: Inactive  . Days of Exercise per Week: 0 days  . Minutes of Exercise per Session: 0 min  Stress: No Stress Concern Present  . Feeling of Stress : Not at all  Social Connections: Moderately Isolated  . Frequency of Communication with Friends and Family: More than three times a week  . Frequency of Social Gatherings with Friends and Family: Once a week  . Attends Religious Services: More than 4 times per year  . Active Member of Clubs or Organizations: No  . Attends Archivist Meetings: Never  . Marital Status: Never married  Intimate Partner Violence: Not on file   Family History  Problem Relation Age of Onset  . Depression Mother   . Diabetes Mother   . Heart disease Father   . Cancer - Prostate  Brother   . Cancer Brother   . Cancer Sister       VITAL SIGNS BP 119/60   Pulse 73   Temp 98.2 F (36.8 C)   Resp 20   Ht '5\' 9"'  (1.753 m)   Wt 120 lb (54.4 kg)   SpO2 97%   BMI 17.72 kg/m   Facility-Administered Encounter Medications as of 05/14/2021  Medication  . prochlorperazine (COMPAZINE) tablet 10 mg   Outpatient Encounter Medications as of 05/14/2021  Medication Sig  . acetaminophen (TYLENOL) 500 MG tablet Take 1,000 mg by mouth every 6 (six) hours as needed for mild pain.  Marland Kitchen acyclovir (ZOVIRAX) 400 MG tablet TAKE 1 TABLET BY MOUTH TWICE DAILY  . aspirin EC 81 MG tablet Take 81 mg by mouth daily.  . busPIRone (BUSPAR) 5 MG tablet Take 5 mg by mouth 2 (two) times daily. For anxiety  . Calcium  Carbonate-Vitamin D (CALCIUM-VITAMIN D3 PO) Take 400 Units by mouth daily in the afternoon.  . cholestyramine (QUESTRAN) 4 g packet Take 4 g by mouth 2 (two) times daily between meals.  . Lactobacillus Probiotic TABS Take 2 tablets by mouth daily.  Marland Kitchen levothyroxine (SYNTHROID, LEVOTHROID) 25 MCG tablet Take 25 mcg by mouth daily before breakfast.  . lipase/protease/amylase (CREON) 36000 UNITS CPEP capsule Take 36,000 Units by mouth 2 (two) times daily. With snacks between meals at 10 am and 8 pm  . lipase/protease/amylase (CREON) 36000 UNITS CPEP capsule Take 72,000 Units by mouth 3 (three) times daily with meals. 8 am, 12 pm, and 6 pm  . loperamide (IMODIUM A-D) 2 MG tablet Take 4 mg by mouth 3 (three) times daily. For diarrhea  . losartan (COZAAR) 50 MG tablet Take 1 tablet (50 mg total) by mouth daily.  . Multiple Vitamins-Minerals (MULTIVITAMIN WOMEN 50+ PO) Take by mouth daily.   . NON FORMULARY Regular diet no dairy products.  . NON FORMULARY Wanderguard #[id number] to ankle for safety awareness. Check placement and function qshift. Special Instructions: Check placement and function qshift. Every Shift Day, Evening, Night  . potassium chloride SA (KLOR-CON) 20 MEQ tablet Take 20 mEq by mouth daily.      SIGNIFICANT DIAGNOSTIC EXAMS   PREVIOUS   08-10-20: t score: -3.758  12-21-20: KUBThere is a mild ileus noted. Mild to moderate increase feces in the colon. No renal stone is seen  12-25-20: KUB  There is a nonspecific bowel gas pattern without obstruction. No residual ileus is noted. Mild increased feces in the colon. No renal stone is seen.  02-05-21: ct of head:  1. No acute intracranial abnormality. Please note if concern for acute infarction MRI would be more sensitive. 2. Similar appearance of bifrontal encephalomalacia consistent with prior infarction/insult  02-05-21: MRI of brain:  No evidence of recent infarction, hemorrhage, or mass. Bifrontal encephalomalacia/gliosis.  Additional chronic microvascular ischemic changes. Questioned small left anterior clinoid process calcified meningioma on prior CT imaging is not well evaluated due to size and lack of contrast.  NO NEW EXAMS.     LABS REVIEWED PREVIOUS    07-20-20: vit B 12: 620  07-23-20: tsh 2.264 07-24-20: wbc 3.9; hgb 9.3; hct 29.3 mcv 105.4 plt 105; glucose 106; bun 25; creat 1.28; k+ 3.6; na++ 142; a 9.2 liver normal albumin 3.5  07-30-20: chol 116; ldl 49; trig 71; hdl 53; hgb a1c 5.1; hep C neg  08-07-20: wbc 3.5; hgb 9.0; hct 27.9; mcv 104.5 plt 112; glucose 86; bun 27; creat 1.22; k+ 4.2;  an++ 143; ca 9.0 liver normal albumin 3.2 08-14-20: wbc 2.8; hgb 9.4; hct 29.2; mcv 104.3 plt 101; glucose 71; bun 27; creat 1.41; k+ 4.3; na++ 140; ca 9.0 liver normal albumin 3.1  08-16-20: urine micro-albumin 9.3 08-21-20: wbc 3.9; hgb 9.5; hct 29.7 mcv 104.2 plt 86; glucose 86; bun 18; creat 1.09; k+ 3.7; na++ 144; ca 8.1; liver normal albumin 3.1  09-18-20: wbc 4.4; hgb 9.6; hct 29.7 mcv 102.4 plt 105; glucose 99; bun 20; creat 1.12; k+ 3.2; na++ 142; ca 8.8 liver normal albumin 3.2  10-16-20: wbc 3.9; hgb 10.2; hct 32.3; mcv 104.9 plt 95; glucose 98; bun 19; creat 1.25; k+ 3.9; na++ 139; ca 8.8 liver normal albumin 3.2  12-04-20: wbc 3.1; hgb 10.9; hct 34.6; mcv 103.0 plt 130; glucose 132; bun 27; creat 1.35; k+ 3.6; na++ 143; ca 9.3 liver normal albumin 3.5  12-25-20: wbc 2.7; hgb 10.1; hct 31.2 mcv 103.0 plt 120; glucose 108; bun 13; creat 1.53; k+ 3.9; na++ 138; ca 8.7 liver normal albumin 3.2 GFR 3 01-01-21: wbc 2.5; hgb 10.6; hct 32.1; mcv 100.6 plt 87; glucose 87; bun 27; creat 1.24; k+ 3.8; na++ 144; ca 9.3 GFR 45; liver normal albumin 3.4  02-05-21: wbc 3.9; hgb 10.2; hct 31.6; mcv 102.9 plt 91; glucose 118; bun 26; creat 1.29; k+ 3.8; na++ 138; ca 8.7 GFR43 liver normal albumin 3.0 urine culture: e-coli 02-07-21: hgb a1c 5.3 04-08-21: chol 142; ldl 66; trig 101; hdl 56 04-09-21: wbc 4.6; hgb 11.3; hct 35.3; mcv 100.6  plt 140; glucose 101; bun 20; creat 1.13; k+ 3.5; na++ 139; ca 8.5 GFR 50; liver normal albumin 3.4   NO NEW LABS.   Review of Systems  Constitutional: Negative for malaise/fatigue.  Respiratory: Negative for cough and shortness of breath.   Cardiovascular: Negative for chest pain, palpitations and leg swelling.  Gastrointestinal: Negative for abdominal pain, constipation and heartburn.  Musculoskeletal: Negative for back pain, joint pain and myalgias.  Skin: Negative.   Neurological: Negative for dizziness.  Psychiatric/Behavioral: The patient is not nervous/anxious.    .   Physical Exam Constitutional:      General: She is not in acute distress.    Appearance: She is cachectic. She is not diaphoretic.  Neck:     Thyroid: No thyromegaly.  Cardiovascular:     Rate and Rhythm: Normal rate and regular rhythm.     Pulses: Normal pulses.     Heart sounds: Normal heart sounds.  Pulmonary:     Effort: Pulmonary effort is normal. No respiratory distress.     Breath sounds: Normal breath sounds.  Abdominal:     General: Bowel sounds are normal. There is no distension.     Palpations: Abdomen is soft.     Tenderness: There is no abdominal tenderness.  Musculoskeletal:        General: Normal range of motion.     Cervical back: Neck supple.     Right lower leg: No edema.     Left lower leg: No edema.  Lymphadenopathy:     Cervical: No cervical adenopathy.  Skin:    General: Skin is warm and dry.  Neurological:     Mental Status: She is alert. Mental status is at baseline.  Psychiatric:        Mood and Affect: Mood normal.      ASSESSMENT/ PLAN:  TODAY  1. Thrombocytopenia: is stable plt 140 will monitor   2. Multiple myeloma without achieving remission: is  without change: is followed by oncology  3. Aortic atherosclerosis: (ct 08-26-15) will  Monitor   4. First degree av block/bradycardia: is stable has been seen by cardiology will monitor    PREVIOUS   5.  Pancreatic insuffiencey: is stable : will continue questran 4 gm twice daily; will continue creon 72,000 units with meals and imodium 2 mg three times daily  6. Macrocytic anemia is stable hgb 9.6 vit B 12: 620; is off iron will monitor   7. Vascular dementia with behavioral disturbance: is stable weight is 120 pounds her aricept was stopped due to side effects. Will continue buspar 5 mg twice daily for her anxiety.   8 Herpes: no recent outbreaks; will continue acyclovir 400 mg twice daily   9. Failure to thrive in adult: is stable at this time her weight is stable at 120 pounds will continue to monitor her status.   10. Hypokalemia: k+ 3.5 will continue k+ 20 meq daily and will monitor  11. Hypothyroidism, unspecified type: is stable tsh 2.264 will continue synthroid 25 mcg daily   12. Hypertension associated with type 2 diabetes mellitus: is stable b/p 119/60 will continue cozaar 50 mg daily and asa 81 mg daily   13. Diabetes mellitus without complication: is stable hgb a1c 5.3 will monitor is on arb statin  14. Post menopausal osteoporosis t score -3.758 will continue calcium and vitamin d  15 Compression fracture of L1 vertebrae with routine healing subsequent encounter: (06/2017) is stable has prn tylenol      Ok Edwards NP [facility] Adult Medicine  Contact [phone] Monday through Friday 8am- 5pm  After hours call [phone]

## 2021-05-20 ENCOUNTER — Other Ambulatory Visit (HOSPITAL_COMMUNITY): Payer: Self-pay

## 2021-05-20 DIAGNOSIS — C9000 Multiple myeloma not having achieved remission: Secondary | ICD-10-CM

## 2021-05-21 ENCOUNTER — Encounter (HOSPITAL_COMMUNITY): Payer: Self-pay

## 2021-05-21 ENCOUNTER — Inpatient Hospital Stay (HOSPITAL_COMMUNITY): Payer: Medicare PPO | Attending: Hematology

## 2021-05-21 ENCOUNTER — Inpatient Hospital Stay (HOSPITAL_COMMUNITY): Payer: Medicare PPO

## 2021-05-21 VITALS — BP 143/81 | HR 70 | Temp 96.9°F | Resp 16 | Wt 121.0 lb

## 2021-05-21 DIAGNOSIS — C9000 Multiple myeloma not having achieved remission: Secondary | ICD-10-CM | POA: Diagnosis not present

## 2021-05-21 DIAGNOSIS — Z5112 Encounter for antineoplastic immunotherapy: Secondary | ICD-10-CM | POA: Insufficient documentation

## 2021-05-21 LAB — CBC WITH DIFFERENTIAL/PLATELET
Abs Immature Granulocytes: 0.01 10*3/uL (ref 0.00–0.07)
Basophils Absolute: 0 10*3/uL (ref 0.0–0.1)
Basophils Relative: 1 %
Eosinophils Absolute: 0.1 10*3/uL (ref 0.0–0.5)
Eosinophils Relative: 2 %
HCT: 32 % — ABNORMAL LOW (ref 36.0–46.0)
Hemoglobin: 10.3 g/dL — ABNORMAL LOW (ref 12.0–15.0)
Immature Granulocytes: 0 %
Lymphocytes Relative: 14 %
Lymphs Abs: 0.5 10*3/uL — ABNORMAL LOW (ref 0.7–4.0)
MCH: 31.9 pg (ref 26.0–34.0)
MCHC: 32.2 g/dL (ref 30.0–36.0)
MCV: 99.1 fL (ref 80.0–100.0)
Monocytes Absolute: 0.5 10*3/uL (ref 0.1–1.0)
Monocytes Relative: 12 %
Neutro Abs: 2.8 10*3/uL (ref 1.7–7.7)
Neutrophils Relative %: 71 %
Platelets: 155 10*3/uL (ref 150–400)
RBC: 3.23 MIL/uL — ABNORMAL LOW (ref 3.87–5.11)
RDW: 15.2 % (ref 11.5–15.5)
WBC: 3.8 10*3/uL — ABNORMAL LOW (ref 4.0–10.5)
nRBC: 0 % (ref 0.0–0.2)

## 2021-05-21 LAB — COMPREHENSIVE METABOLIC PANEL
ALT: 20 U/L (ref 0–44)
AST: 27 U/L (ref 15–41)
Albumin: 3.1 g/dL — ABNORMAL LOW (ref 3.5–5.0)
Alkaline Phosphatase: 69 U/L (ref 38–126)
Anion gap: 6 (ref 5–15)
BUN: 21 mg/dL (ref 8–23)
CO2: 25 mmol/L (ref 22–32)
Calcium: 8.6 mg/dL — ABNORMAL LOW (ref 8.9–10.3)
Chloride: 109 mmol/L (ref 98–111)
Creatinine, Ser: 1.06 mg/dL — ABNORMAL HIGH (ref 0.44–1.00)
GFR, Estimated: 54 mL/min — ABNORMAL LOW (ref >60)
Glucose, Bld: 154 mg/dL — ABNORMAL HIGH (ref 70–99)
Potassium: 4.2 mmol/L (ref 3.5–5.1)
Sodium: 140 mmol/L (ref 135–145)
Total Bilirubin: 0.5 mg/dL (ref 0.3–1.2)
Total Protein: 6.5 g/dL (ref 6.5–8.1)

## 2021-05-21 MED ORDER — BORTEZOMIB CHEMO SQ INJECTION 3.5 MG (2.5MG/ML)
1.3000 mg/m2 | Freq: Once | INTRAMUSCULAR | Status: AC
  Administered 2021-05-21: 2 mg via SUBCUTANEOUS
  Filled 2021-05-21: qty 0.8

## 2021-05-21 MED ORDER — DEXAMETHASONE 4 MG PO TABS
ORAL_TABLET | ORAL | Status: AC
  Filled 2021-05-21: qty 1

## 2021-05-21 MED ORDER — DEXAMETHASONE 4 MG PO TABS
10.0000 mg | ORAL_TABLET | Freq: Once | ORAL | Status: AC
Start: 2021-05-21 — End: 2021-05-21
  Administered 2021-05-21: 10 mg via ORAL

## 2021-05-21 NOTE — Progress Notes (Signed)
Pt here for velcade.  Labs and vital signs WNL for treatment today.  Tolerated velcade today without incidence.  Vital signs stable.  Stable during and after injection. Discharged in stable condition via wheelchair.  AVS reviewed.

## 2021-05-21 NOTE — Patient Instructions (Signed)
[place]  Discharge Instructions: Thank you for choosing [facility] to provide your oncology and hematology care.  If you have a lab appointment with the [facility], please come in thru the Main Entrance and check in at the main information desk.  Wear comfortable clothing and clothing appropriate for easy access to any Portacath or PICC line.   We strive to give you quality time with your provider. You may need to reschedule your appointment if you arrive late (15 or more minutes).  Arriving late affects you and other patients whose appointments are after yours.  Also, if you miss three or more appointments without notifying the office, you may be dismissed from the clinic at the provider's discretion.      For prescription refill requests, have your pharmacy contact our office and allow 72 hours for refills to be completed.    Today you received the following chemotherapy and/or immunotherapy agents velcade.  Return as schedule.  Please call the clinic if you have any questions or concerns.      To help prevent nausea and vomiting after your treatment, we encourage you to take your nausea medication as directed.  BELOW ARE SYMPTOMS THAT SHOULD BE REPORTED IMMEDIATELY: . *FEVER GREATER THAN 100.4 F (38 C) OR HIGHER . *CHILLS OR SWEATING . *NAUSEA AND VOMITING THAT IS NOT CONTROLLED WITH YOUR NAUSEA MEDICATION . *UNUSUAL SHORTNESS OF BREATH . *UNUSUAL BRUISING OR BLEEDING . *URINARY PROBLEMS (pain or burning when urinating, or frequent urination) . *BOWEL PROBLEMS (unusual diarrhea, constipation, pain near the anus) . TENDERNESS IN MOUTH AND THROAT WITH OR WITHOUT PRESENCE OF ULCERS (sore throat, sores in mouth, or a toothache) . UNUSUAL RASH, SWELLING OR PAIN  . UNUSUAL VAGINAL DISCHARGE OR ITCHING   Items with * indicate a potential emergency and should be followed up as soon as possible or go to the Emergency Department if any problems should  occur.  Please show the CHEMOTHERAPY ALERT CARD or IMMUNOTHERAPY ALERT CARD at check-in to the Emergency Department and triage nurse.  Should you have questions after your visit or need to cancel or reschedule your appointment, please contact [facility] [phone]  and follow the prompts.  Office hours are 8:00 a.m. to 4:30 p.m. Monday - Friday. Please note that voicemails left after 4:00 p.m. may not be returned until the following business day.  We are closed weekends and major holidays. You have access to a nurse at all times for urgent questions. Please call the main number to the clinic [phone] and follow the prompts.  For any non-urgent questions, you may also contact your provider using MyChart. We now offer e-Visits for anyone 75 and older to request care online for non-urgent symptoms. For details visit [email or web address].   Also download the MyChart app! Go to the app store, search "MyChart", open the app, select [place], and log in with your MyChart username and password.  Due to Covid, a mask is required upon entering the hospital/clinic. If you do not have a mask, one will be given to you upon arrival. For doctor visits, patients may have 1 support person aged 26 or older with them. For treatment visits, patients cannot have anyone with them due to current Covid guidelines and our immunocompromised population.   Bortezomib injection What is this medicine? BORTEZOMIB (bor TEZ oh mib) targets proteins in cancer cells and stops the cancer cells from growing. It treats multiple myeloma and mantle cell lymphoma. This  medicine may be used for other purposes; ask your health care provider or pharmacist if you have questions. COMMON BRAND NAME(S): Velcade What should I tell my health care provider before I take this medicine? They need to know if you have any of these conditions:  dehydration  diabetes (high blood sugar)  heart disease  liver  disease  tingling of the fingers or toes or other nerve disorder  an unusual or allergic reaction to bortezomib, mannitol, boron, other medicines, foods, dyes, or preservatives  pregnant or trying to get pregnant  breast-feeding How should I use this medicine? This medicine is injected into a vein or under the skin. It is given by a health care provider in a hospital or clinic setting. Talk to your health care provider about the use of this medicine in children. Special care may be needed. Overdosage: If you think you have taken too much of this medicine contact a poison control center or emergency room at once. NOTE: This medicine is only for you. Do not share this medicine with others. What if I miss a dose? Keep appointments for follow-up doses. It is important not to miss your dose. Call your health care provider if you are unable to keep an appointment. What may interact with this medicine? This medicine may interact with the following medications:  ketoconazole  rifampin This list may not describe all possible interactions. Give your health care provider a list of all the medicines, herbs, non-prescription drugs, or dietary supplements you use. Also tell them if you smoke, drink alcohol, or use illegal drugs. Some items may interact with your medicine. What should I watch for while using this medicine? Your condition will be monitored carefully while you are receiving this medicine. You may need blood work done while you are taking this medicine. You may get drowsy or dizzy. Do not drive, use machinery, or do anything that needs mental alertness until you know how this medicine affects you. Do not stand up or sit up quickly, especially if you are an older patient. This reduces the risk of dizzy or fainting spells This medicine may increase your risk of getting an infection. Call your health care provider for advice if you get a fever, chills, sore throat, or other symptoms of a cold  or flu. Do not treat yourself. Try to avoid being around people who are sick. Check with your health care provider if you have severe diarrhea, nausea, and vomiting, or if you sweat a lot. The loss of too much body fluid may make it dangerous for you to take this medicine. Do not become pregnant while taking this medicine or for 7 months after stopping it. Women should inform their health care provider if they wish to become pregnant or think they might be pregnant. Men should not father a child while taking this medicine and for 4 months after stopping it. There is a potential for serious harm to an unborn child. Talk to your health care provider for more information. Do not breast-feed an infant while taking this medicine or for 2 months after stopping it. This medicine may make it more difficult to get pregnant or father a child. Talk to your health care provider if you are concerned about your fertility. What side effects may I notice from receiving this medicine? Side effects that you should report to your doctor or health care professional as soon as possible:  allergic reactions (skin rash; itching or hives; swelling of the face, lips, or  tongue)  bleeding (bloody or black, tarry stools; red or dark brown urine; spitting up blood or brown material that looks like coffee grounds; red spots on the skin; unusual bruising or bleeding from the eye, gums, or nose)  blurred vision or changes in vision  confusion  constipation  headache  heart failure (trouble breathing; fast, irregular heartbeat; sudden weight gain; swelling of the ankles, feet, hands)  infection (fever, chills, cough, sore throat, pain or trouble passing urine)  lack or loss of appetite  liver injury (dark yellow or brown urine; general ill feeling or flu-like symptoms; loss of appetite, right upper belly pain; yellowing of the eyes or skin)  low blood pressure (dizziness; feeling faint or lightheaded, falls; unusually  weak or tired)  muscle cramps  pain, redness, or irritation at site where injected  pain, tingling, numbness in the hands or feet  seizures  trouble breathing  unusual bruising or bleeding Side effects that usually do not require medical attention (report to your doctor or health care professional if they continue or are bothersome):  diarrhea  nausea  stomach pain  trouble sleeping  vomiting This list may not describe all possible side effects. Call your doctor for medical advice about side effects. You may report side effects to FDA at 1-800-FDA-1088. Where should I keep my medicine? This medicine is given in a hospital or clinic. It will not be stored at home. NOTE: This sheet is a summary. It may not cover all possible information. If you have questions about this medicine, talk to your doctor, pharmacist, or health care provider.  2021 Elsevier/Gold Standard (2020-11-22 13:22:53)

## 2021-05-28 ENCOUNTER — Encounter (HOSPITAL_COMMUNITY): Payer: Self-pay

## 2021-05-28 ENCOUNTER — Inpatient Hospital Stay (HOSPITAL_COMMUNITY): Payer: Medicare PPO

## 2021-05-28 VITALS — BP 115/75 | HR 78 | Temp 97.2°F | Resp 18 | Wt 120.0 lb

## 2021-05-28 DIAGNOSIS — C9000 Multiple myeloma not having achieved remission: Secondary | ICD-10-CM

## 2021-05-28 DIAGNOSIS — Z5112 Encounter for antineoplastic immunotherapy: Secondary | ICD-10-CM | POA: Diagnosis not present

## 2021-05-28 LAB — CBC WITH DIFFERENTIAL/PLATELET
Abs Immature Granulocytes: 0.01 10*3/uL (ref 0.00–0.07)
Basophils Absolute: 0 10*3/uL (ref 0.0–0.1)
Basophils Relative: 1 %
Eosinophils Absolute: 0.1 10*3/uL (ref 0.0–0.5)
Eosinophils Relative: 2 %
HCT: 34.2 % — ABNORMAL LOW (ref 36.0–46.0)
Hemoglobin: 11 g/dL — ABNORMAL LOW (ref 12.0–15.0)
Immature Granulocytes: 0 %
Lymphocytes Relative: 16 %
Lymphs Abs: 0.7 10*3/uL (ref 0.7–4.0)
MCH: 31.6 pg (ref 26.0–34.0)
MCHC: 32.2 g/dL (ref 30.0–36.0)
MCV: 98.3 fL (ref 80.0–100.0)
Monocytes Absolute: 0.5 10*3/uL (ref 0.1–1.0)
Monocytes Relative: 13 %
Neutro Abs: 2.8 10*3/uL (ref 1.7–7.7)
Neutrophils Relative %: 68 %
Platelets: 140 10*3/uL — ABNORMAL LOW (ref 150–400)
RBC: 3.48 MIL/uL — ABNORMAL LOW (ref 3.87–5.11)
RDW: 15.7 % — ABNORMAL HIGH (ref 11.5–15.5)
WBC: 4.1 10*3/uL (ref 4.0–10.5)
nRBC: 0 % (ref 0.0–0.2)

## 2021-05-28 LAB — COMPREHENSIVE METABOLIC PANEL
ALT: 20 U/L (ref 0–44)
AST: 27 U/L (ref 15–41)
Albumin: 3.2 g/dL — ABNORMAL LOW (ref 3.5–5.0)
Alkaline Phosphatase: 67 U/L (ref 38–126)
Anion gap: 6 (ref 5–15)
BUN: 21 mg/dL (ref 8–23)
CO2: 28 mmol/L (ref 22–32)
Calcium: 8.9 mg/dL (ref 8.9–10.3)
Chloride: 107 mmol/L (ref 98–111)
Creatinine, Ser: 1.25 mg/dL — ABNORMAL HIGH (ref 0.44–1.00)
GFR, Estimated: 45 mL/min — ABNORMAL LOW (ref >60)
Glucose, Bld: 89 mg/dL (ref 70–99)
Potassium: 3.8 mmol/L (ref 3.5–5.1)
Sodium: 141 mmol/L (ref 135–145)
Total Bilirubin: 0.5 mg/dL (ref 0.3–1.2)
Total Protein: 6.4 g/dL — ABNORMAL LOW (ref 6.5–8.1)

## 2021-05-28 MED ORDER — DEXAMETHASONE 4 MG PO TABS
10.0000 mg | ORAL_TABLET | Freq: Once | ORAL | Status: AC
  Administered 2021-05-28: 10 mg via ORAL

## 2021-05-28 MED ORDER — BORTEZOMIB CHEMO SQ INJECTION 3.5 MG (2.5MG/ML)
1.3000 mg/m2 | Freq: Once | INTRAMUSCULAR | Status: AC
  Administered 2021-05-28: 2 mg via SUBCUTANEOUS
  Filled 2021-05-28: qty 0.8

## 2021-05-28 NOTE — Progress Notes (Signed)
Patient tolerated Velcade injection with no complaints voiced. Lab work reviewed. See MAR for details. Injection site clean and dry with no bruising or swelling noted. Patient stable during and after injection. Band aid applied. VSS. Patient left in satisfactory condition with no s/s of distress noted. 

## 2021-05-28 NOTE — Patient Instructions (Signed)
[place]  Discharge Instructions: Thank you for choosing [facility] to provide your oncology and hematology care.  If you have a lab appointment with the [facility], please come in thru the Main Entrance and check in at the main information desk.  Wear comfortable clothing and clothing appropriate for easy access to any Portacath or PICC line.   We strive to give you quality time with your provider. You may need to reschedule your appointment if you arrive late (15 or more minutes).  Arriving late affects you and other patients whose appointments are after yours.  Also, if you miss three or more appointments without notifying the office, you may be dismissed from the clinic at the provider's discretion.      For prescription refill requests, have your pharmacy contact our office and allow 72 hours for refills to be completed.    Today you received the following chemotherapy and/or immunotherapy agents: Velcade   To help prevent nausea and vomiting after your treatment, we encourage you to take your nausea medication as directed.  BELOW ARE SYMPTOMS THAT SHOULD BE REPORTED IMMEDIATELY: *FEVER GREATER THAN 100.4 F (38 C) OR HIGHER *CHILLS OR SWEATING *NAUSEA AND VOMITING THAT IS NOT CONTROLLED WITH YOUR NAUSEA MEDICATION *UNUSUAL SHORTNESS OF BREATH *UNUSUAL BRUISING OR BLEEDING *URINARY PROBLEMS (pain or burning when urinating, or frequent urination) *BOWEL PROBLEMS (unusual diarrhea, constipation, pain near the anus) TENDERNESS IN MOUTH AND THROAT WITH OR WITHOUT PRESENCE OF ULCERS (sore throat, sores in mouth, or a toothache) UNUSUAL RASH, SWELLING OR PAIN  UNUSUAL VAGINAL DISCHARGE OR ITCHING   Items with * indicate a potential emergency and should be followed up as soon as possible or go to the Emergency Department if any problems should occur.  Please show the CHEMOTHERAPY ALERT CARD or IMMUNOTHERAPY ALERT CARD at check-in to the Emergency Department  and triage nurse.  Should you have questions after your visit or need to cancel or reschedule your appointment, please contact [facility] [phone]  and follow the prompts.  Office hours are 8:00 a.m. to 4:30 p.m. Monday - Friday. Please note that voicemails left after 4:00 p.m. may not be returned until the following business day.  We are closed weekends and major holidays. You have access to a nurse at all times for urgent questions. Please call the main number to the clinic [phone] and follow the prompts.  For any non-urgent questions, you may also contact your provider using MyChart. We now offer e-Visits for anyone 31 and older to request care online for non-urgent symptoms. For details visit [email or web address].   Also download the MyChart app! Go to the app store, search "MyChart", open the app, select [place], and log in with your MyChart username and password.  Due to Covid, a mask is required upon entering the hospital/clinic. If you do not have a mask, one will be given to you upon arrival. For doctor visits, patients may have 1 support person aged 85 or older with them. For treatment visits, patients cannot have anyone with them due to current Covid guidelines and our immunocompromised population.

## 2021-06-04 ENCOUNTER — Encounter (HOSPITAL_COMMUNITY): Payer: Self-pay

## 2021-06-04 ENCOUNTER — Inpatient Hospital Stay (HOSPITAL_COMMUNITY): Payer: Medicare PPO

## 2021-06-04 VITALS — BP 120/87 | HR 43 | Temp 96.9°F | Resp 17 | Wt 120.0 lb

## 2021-06-04 DIAGNOSIS — Z5112 Encounter for antineoplastic immunotherapy: Secondary | ICD-10-CM | POA: Diagnosis not present

## 2021-06-04 DIAGNOSIS — C9000 Multiple myeloma not having achieved remission: Secondary | ICD-10-CM

## 2021-06-04 LAB — CBC WITH DIFFERENTIAL/PLATELET
Abs Immature Granulocytes: 0.02 10*3/uL (ref 0.00–0.07)
Basophils Absolute: 0 10*3/uL (ref 0.0–0.1)
Basophils Relative: 0 %
Eosinophils Absolute: 0.1 10*3/uL (ref 0.0–0.5)
Eosinophils Relative: 2 %
HCT: 35.6 % — ABNORMAL LOW (ref 36.0–46.0)
Hemoglobin: 11.4 g/dL — ABNORMAL LOW (ref 12.0–15.0)
Immature Granulocytes: 0 %
Lymphocytes Relative: 14 %
Lymphs Abs: 0.7 10*3/uL (ref 0.7–4.0)
MCH: 31.1 pg (ref 26.0–34.0)
MCHC: 32 g/dL (ref 30.0–36.0)
MCV: 97 fL (ref 80.0–100.0)
Monocytes Absolute: 0.6 10*3/uL (ref 0.1–1.0)
Monocytes Relative: 12 %
Neutro Abs: 3.8 10*3/uL (ref 1.7–7.7)
Neutrophils Relative %: 72 %
Platelets: 124 10*3/uL — ABNORMAL LOW (ref 150–400)
RBC: 3.67 MIL/uL — ABNORMAL LOW (ref 3.87–5.11)
RDW: 15.9 % — ABNORMAL HIGH (ref 11.5–15.5)
WBC: 5.2 10*3/uL (ref 4.0–10.5)
nRBC: 0 % (ref 0.0–0.2)

## 2021-06-04 LAB — COMPREHENSIVE METABOLIC PANEL
ALT: 23 U/L (ref 0–44)
AST: 32 U/L (ref 15–41)
Albumin: 3.5 g/dL (ref 3.5–5.0)
Alkaline Phosphatase: 74 U/L (ref 38–126)
Anion gap: 6 (ref 5–15)
BUN: 23 mg/dL (ref 8–23)
CO2: 29 mmol/L (ref 22–32)
Calcium: 9 mg/dL (ref 8.9–10.3)
Chloride: 102 mmol/L (ref 98–111)
Creatinine, Ser: 1.35 mg/dL — ABNORMAL HIGH (ref 0.44–1.00)
GFR, Estimated: 41 mL/min — ABNORMAL LOW (ref >60)
Glucose, Bld: 171 mg/dL — ABNORMAL HIGH (ref 70–99)
Potassium: 4 mmol/L (ref 3.5–5.1)
Sodium: 137 mmol/L (ref 135–145)
Total Bilirubin: 0.5 mg/dL (ref 0.3–1.2)
Total Protein: 6.8 g/dL (ref 6.5–8.1)

## 2021-06-04 MED ORDER — DEXAMETHASONE 4 MG PO TABS
ORAL_TABLET | ORAL | Status: AC
  Filled 2021-06-04: qty 3

## 2021-06-04 MED ORDER — DEXAMETHASONE 4 MG PO TABS
10.0000 mg | ORAL_TABLET | Freq: Once | ORAL | Status: AC
  Administered 2021-06-04: 10 mg via ORAL

## 2021-06-04 MED ORDER — BORTEZOMIB CHEMO SQ INJECTION 3.5 MG (2.5MG/ML)
1.3000 mg/m2 | Freq: Once | INTRAMUSCULAR | Status: AC
  Administered 2021-06-04: 2 mg via SUBCUTANEOUS
  Filled 2021-06-04: qty 0.8

## 2021-06-04 NOTE — Progress Notes (Signed)
Patient presents today for Velcade injection. MAR reviewed. Vital signs stable. Patient has no complaints of any changes since her last treatment. Patient denies pain today.   Treatment given today per MD orders. Tolerated without adverse affects. Vital signs stable. No complaints at this time. Discharged from clinic by wheel chair in stable condition. Alert and oriented x 3. F/U with [facility] as scheduled.

## 2021-06-04 NOTE — Patient Instructions (Signed)
[place]  Discharge Instructions: Thank you for choosing [facility] to provide your oncology and hematology care.  If you have a lab appointment with the [facility], please come in thru the Main Entrance and check in at the main information desk.  Wear comfortable clothing and clothing appropriate for easy access to any Portacath or PICC line.   We strive to give you quality time with your provider. You may need to reschedule your appointment if you arrive late (15 or more minutes).  Arriving late affects you and other patients whose appointments are after yours.  Also, if you miss three or more appointments without notifying the office, you may be dismissed from the clinic at the provider's discretion.      For prescription refill requests, have your pharmacy contact our office and allow 72 hours for refills to be completed.    Today you received the following chemotherapy and/or immunotherapy agents Velcade.       To help prevent nausea and vomiting after your treatment, we encourage you to take your nausea medication as directed.  BELOW ARE SYMPTOMS THAT SHOULD BE REPORTED IMMEDIATELY: *FEVER GREATER THAN 100.4 F (38 C) OR HIGHER *CHILLS OR SWEATING *NAUSEA AND VOMITING THAT IS NOT CONTROLLED WITH YOUR NAUSEA MEDICATION *UNUSUAL SHORTNESS OF BREATH *UNUSUAL BRUISING OR BLEEDING *URINARY PROBLEMS (pain or burning when urinating, or frequent urination) *BOWEL PROBLEMS (unusual diarrhea, constipation, pain near the anus) TENDERNESS IN MOUTH AND THROAT WITH OR WITHOUT PRESENCE OF ULCERS (sore throat, sores in mouth, or a toothache) UNUSUAL RASH, SWELLING OR PAIN  UNUSUAL VAGINAL DISCHARGE OR ITCHING   Items with * indicate a potential emergency and should be followed up as soon as possible or go to the Emergency Department if any problems should occur.  Please show the CHEMOTHERAPY ALERT CARD or IMMUNOTHERAPY ALERT CARD at check-in to the Emergency  Department and triage nurse.  Should you have questions after your visit or need to cancel or reschedule your appointment, please contact [facility] [phone]  and follow the prompts.  Office hours are 8:00 a.m. to 4:30 p.m. Monday - Friday. Please note that voicemails left after 4:00 p.m. may not be returned until the following business day.  We are closed weekends and major holidays. You have access to a nurse at all times for urgent questions. Please call the main number to the clinic [phone] and follow the prompts.  For any non-urgent questions, you may also contact your provider using MyChart. We now offer e-Visits for anyone 84 and older to request care online for non-urgent symptoms. For details visit [email or web address].   Also download the MyChart app! Go to the app store, search "MyChart", open the app, select [place], and log in with your MyChart username and password.  Due to Covid, a mask is required upon entering the hospital/clinic. If you do not have a mask, one will be given to you upon arrival. For doctor visits, patients may have 1 support person aged 39 or older with them. For treatment visits, patients cannot have anyone with them due to current Covid guidelines and our immunocompromised population.

## 2021-06-12 DIAGNOSIS — C8590 Non-Hodgkin lymphoma, unspecified, unspecified site: Secondary | ICD-10-CM | POA: Diagnosis not present

## 2021-06-12 DIAGNOSIS — Z1159 Encounter for screening for other viral diseases: Secondary | ICD-10-CM | POA: Diagnosis not present

## 2021-06-17 ENCOUNTER — Non-Acute Institutional Stay (SKILLED_NURSING_FACILITY): Payer: Medicare PPO | Admitting: Adult Health

## 2021-06-17 DIAGNOSIS — F01518 Vascular dementia, unspecified severity, with other behavioral disturbance: Secondary | ICD-10-CM

## 2021-06-17 DIAGNOSIS — F0151 Vascular dementia with behavioral disturbance: Secondary | ICD-10-CM | POA: Diagnosis not present

## 2021-06-17 DIAGNOSIS — D539 Nutritional anemia, unspecified: Secondary | ICD-10-CM | POA: Diagnosis not present

## 2021-06-17 DIAGNOSIS — K8689 Other specified diseases of pancreas: Secondary | ICD-10-CM

## 2021-06-17 DIAGNOSIS — B009 Herpesviral infection, unspecified: Secondary | ICD-10-CM

## 2021-06-18 ENCOUNTER — Other Ambulatory Visit: Payer: Self-pay

## 2021-06-18 ENCOUNTER — Other Ambulatory Visit: Payer: Self-pay | Admitting: Hematology and Oncology

## 2021-06-18 ENCOUNTER — Inpatient Hospital Stay (HOSPITAL_COMMUNITY): Payer: Medicare PPO

## 2021-06-18 ENCOUNTER — Inpatient Hospital Stay (HOSPITAL_COMMUNITY): Payer: Medicare PPO | Attending: Hematology and Oncology

## 2021-06-18 ENCOUNTER — Encounter: Payer: Self-pay | Admitting: Adult Health

## 2021-06-18 VITALS — BP 149/95 | HR 74 | Temp 97.3°F | Resp 18 | Wt 119.6 lb

## 2021-06-18 DIAGNOSIS — C9000 Multiple myeloma not having achieved remission: Secondary | ICD-10-CM | POA: Diagnosis not present

## 2021-06-18 DIAGNOSIS — Z5111 Encounter for antineoplastic chemotherapy: Secondary | ICD-10-CM | POA: Insufficient documentation

## 2021-06-18 DIAGNOSIS — Z1159 Encounter for screening for other viral diseases: Secondary | ICD-10-CM | POA: Diagnosis not present

## 2021-06-18 DIAGNOSIS — C8590 Non-Hodgkin lymphoma, unspecified, unspecified site: Secondary | ICD-10-CM | POA: Diagnosis not present

## 2021-06-18 LAB — CBC WITH DIFFERENTIAL/PLATELET
Abs Immature Granulocytes: 0.01 10*3/uL (ref 0.00–0.07)
Basophils Absolute: 0 10*3/uL (ref 0.0–0.1)
Basophils Relative: 0 %
Eosinophils Absolute: 0.1 10*3/uL (ref 0.0–0.5)
Eosinophils Relative: 1 %
HCT: 33.7 % — ABNORMAL LOW (ref 36.0–46.0)
Hemoglobin: 10.6 g/dL — ABNORMAL LOW (ref 12.0–15.0)
Immature Granulocytes: 0 %
Lymphocytes Relative: 14 %
Lymphs Abs: 0.6 10*3/uL — ABNORMAL LOW (ref 0.7–4.0)
MCH: 31 pg (ref 26.0–34.0)
MCHC: 31.5 g/dL (ref 30.0–36.0)
MCV: 98.5 fL (ref 80.0–100.0)
Monocytes Absolute: 0.5 10*3/uL (ref 0.1–1.0)
Monocytes Relative: 10 %
Neutro Abs: 3.3 10*3/uL (ref 1.7–7.7)
Neutrophils Relative %: 75 %
Platelets: 174 10*3/uL (ref 150–400)
RBC: 3.42 MIL/uL — ABNORMAL LOW (ref 3.87–5.11)
RDW: 16.4 % — ABNORMAL HIGH (ref 11.5–15.5)
WBC: 4.5 10*3/uL (ref 4.0–10.5)
nRBC: 0 % (ref 0.0–0.2)

## 2021-06-18 LAB — COMPREHENSIVE METABOLIC PANEL
ALT: 18 U/L (ref 0–44)
AST: 31 U/L (ref 15–41)
Albumin: 3.3 g/dL — ABNORMAL LOW (ref 3.5–5.0)
Alkaline Phosphatase: 67 U/L (ref 38–126)
Anion gap: 7 (ref 5–15)
BUN: 22 mg/dL (ref 8–23)
CO2: 23 mmol/L (ref 22–32)
Calcium: 8.9 mg/dL (ref 8.9–10.3)
Chloride: 111 mmol/L (ref 98–111)
Creatinine, Ser: 1.2 mg/dL — ABNORMAL HIGH (ref 0.44–1.00)
GFR, Estimated: 47 mL/min — ABNORMAL LOW (ref >60)
Glucose, Bld: 111 mg/dL — ABNORMAL HIGH (ref 70–99)
Potassium: 3.7 mmol/L (ref 3.5–5.1)
Sodium: 141 mmol/L (ref 135–145)
Total Bilirubin: 0.4 mg/dL (ref 0.3–1.2)
Total Protein: 6.5 g/dL (ref 6.5–8.1)

## 2021-06-18 LAB — LACTATE DEHYDROGENASE: LDH: 194 U/L — ABNORMAL HIGH (ref 98–192)

## 2021-06-18 MED ORDER — BORTEZOMIB CHEMO SQ INJECTION 3.5 MG (2.5MG/ML)
1.3000 mg/m2 | Freq: Once | INTRAMUSCULAR | Status: AC
  Administered 2021-06-18: 2 mg via SUBCUTANEOUS
  Filled 2021-06-18: qty 0.8

## 2021-06-18 MED ORDER — DEXAMETHASONE 4 MG PO TABS
10.0000 mg | ORAL_TABLET | Freq: Once | ORAL | Status: AC
Start: 2021-06-18 — End: 2021-06-18
  Administered 2021-06-18: 10 mg via ORAL
  Filled 2021-06-18: qty 3

## 2021-06-18 NOTE — Patient Instructions (Signed)
[place]  Discharge Instructions: Thank you for choosing [facility] to provide your oncology and hematology care.  If you have a lab appointment with the [facility], please come in thru the Main Entrance and check in at the main information desk.  Wear comfortable clothing and clothing appropriate for easy access to any Portacath or PICC line.   We strive to give you quality time with your provider. You may need to reschedule your appointment if you arrive late (15 or more minutes).  Arriving late affects you and other patients whose appointments are after yours.  Also, if you miss three or more appointments without notifying the office, you may be dismissed from the clinic at the provider's discretion.      For prescription refill requests, have your pharmacy contact our office and allow 72 hours for refills to be completed.    Today you received the following chemotherapy and/or immunotherapy agents Velcade injection      To help prevent nausea and vomiting after your treatment, we encourage you to take your nausea medication as directed.  BELOW ARE SYMPTOMS THAT SHOULD BE REPORTED IMMEDIATELY: *FEVER GREATER THAN 100.4 F (38 C) OR HIGHER *CHILLS OR SWEATING *NAUSEA AND VOMITING THAT IS NOT CONTROLLED WITH YOUR NAUSEA MEDICATION *UNUSUAL SHORTNESS OF BREATH *UNUSUAL BRUISING OR BLEEDING *URINARY PROBLEMS (pain or burning when urinating, or frequent urination) *BOWEL PROBLEMS (unusual diarrhea, constipation, pain near the anus) TENDERNESS IN MOUTH AND THROAT WITH OR WITHOUT PRESENCE OF ULCERS (sore throat, sores in mouth, or a toothache) UNUSUAL RASH, SWELLING OR PAIN  UNUSUAL VAGINAL DISCHARGE OR ITCHING   Items with * indicate a potential emergency and should be followed up as soon as possible or go to the Emergency Department if any problems should occur.  Please show the CHEMOTHERAPY ALERT CARD or IMMUNOTHERAPY ALERT CARD at check-in to the  Emergency Department and triage nurse.  Should you have questions after your visit or need to cancel or reschedule your appointment, please contact [facility] [phone]  and follow the prompts.  Office hours are 8:00 a.m. to 4:30 p.m. Monday - Friday. Please note that voicemails left after 4:00 p.m. may not be returned until the following business day.  We are closed weekends and major holidays. You have access to a nurse at all times for urgent questions. Please call the main number to the clinic [phone] and follow the prompts.  For any non-urgent questions, you may also contact your provider using MyChart. We now offer e-Visits for anyone 39 and older to request care online for non-urgent symptoms. For details visit [email or web address].   Also download the MyChart app! Go to the app store, search "MyChart", open the app, select [place], and log in with your MyChart username and password.  Due to Covid, a mask is required upon entering the hospital/clinic. If you do not have a mask, one will be given to you upon arrival. For doctor visits, patients may have 1 support person aged 65 or older with them. For treatment visits, patients cannot have anyone with them due to current Covid guidelines and our immunocompromised population.

## 2021-06-18 NOTE — Progress Notes (Signed)
Patient presents today for Velcade injectionper providers order.  Vital signs within parameters for treatment.  Labs pending.  Patient has no new complaints since last visit.  Labs reviewed and within parameters for treatment.  Velcade given today per MD orders.  Stable during injection without adverse affects.  Vital signs stable.  No complaints at this time.  Discharge from clinic via wheelchair in stable condition.  Alert and oriented X 3.  Follow up with [facility] as scheduled.

## 2021-06-18 NOTE — Progress Notes (Signed)
Location:  [facility] Room Number: 148 Place of Service:  SNF (31)   CODE STATUS: dnr  Allergies  Allergen Reactions  . Lotensin [Benazepril]     Chief Complaint  Patient presents with  . Medical Management of Chronic Issues             Pancreatic insufficiency:   Macrocytic anemia:    Vascular dementia with behavioral disturbance:    Herpes    HPI:  She is a 75 year old long term resident of this facility being seen for the management of her chronic illnesses; Pancreatic insufficiency:   Macrocytic anemia:    Vascular dementia with behavioral disturbance:    Herpes. There are no reports of uncontrolled pain; her anxiety is under control at this time; there are no reports of changes in appetite; she is slowly gaining weight.   Past Medical History:  Diagnosis Date  . Benign hypertension   . Cataract   . 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 Diabetes mellitus without complication ([place])   . H/O partial nephrectomy   . Hypokalemia   . Hypothyroidism   . Impaired cognition   . Multiple myeloma (H[facility])    Dr Maylon Peppers, [facility]  . Thyroid disease     Past Surgical History:  Procedure Laterality Date  . ABDOMINAL HYSTERECTOMY    . CRANIOTOMY     for lymphoma  . YAG LASER APPLICATION Right 9/48/5462   Procedure: YAG LASER APPLICATION;  Surgeon: [name], MD;  Location: [facility];  Service: Ophthalmology;  Laterality: Right;    Social History   Socioeconomic History  . Marital status: Single    Spouse name: Not on file  . Number of children: Not on file  . Years of education: Not on file  . Highest education level: Not on file  Occupational History  . Occupation: retired   Tobacco Use  . Smoking status: Never  . Smokeless tobacco: Never  Vaping Use  . Vaping Use: Never used  Substance and Sexual Activity  . Alcohol use: No  . Drug use: No  . Sexual activity: Never  Other Topics Concern  . Not on file  Social History Narrative    Long term resident of [facility]    Social Determinants of Health   Financial Resource Strain: Low Risk   . Difficulty of Paying Living Expenses: Not very hard  Food Insecurity: No Food Insecurity  . Worried About Charity fundraiser in the Last Year: Never true  . Ran Out of Food in the Last Year: Never true  Transportation Needs: No Transportation Needs  . Lack of Transportation (Medical): No  . Lack of Transportation (Non-Medical): No  Physical Activity: Inactive  . Days of Exercise per Week: 0 days  . Minutes of Exercise per Session: 0 min  Stress: No Stress Concern Present  . Feeling of Stress : Not at all  Social Connections: Moderately Isolated  . Frequency of Communication with Friends and Family: More than three times a week  . Frequency of Social Gatherings with Friends and Family: Once a week  . Attends Religious Services: More than 4 times per year  . Active Member of Clubs or Organizations: No  . Attends Archivist Meetings: Never  . Marital Status: Never married  Intimate Partner Violence: Not on file   Family History  Problem Relation Age of Onset  . Depression Mother   . Diabetes Mother   . Heart  disease Father   . Cancer - Prostate Brother   . Cancer Brother   . Cancer Sister       VITAL SIGNS BP 119/60   Pulse 88   Temp 97.8 F (36.6 C)   Resp 18   Ht '5\' 9"'  (1.753 m)   Wt 121 lb 12.8 oz (55.2 kg)   BMI 17.99 kg/m   Facility-Administered Encounter Medications as of 06/17/2021  Medication  . prochlorperazine (COMPAZINE) tablet 10 mg   Outpatient Encounter Medications as of 06/17/2021  Medication Sig  . acetaminophen (TYLENOL) 500 MG tablet Take 1,000 mg by mouth every 6 (six) hours as needed for mild pain.  Marland Kitchen acyclovir (ZOVIRAX) 400 MG tablet TAKE 1 TABLET BY MOUTH TWICE DAILY  . aspirin EC 81 MG tablet Take 81 mg by mouth daily.  . busPIRone (BUSPAR) 5 MG tablet Take 5 mg by mouth 2 (two) times daily. For anxiety  . Calcium Carbonate-Vitamin  D (CALCIUM-VITAMIN D3 PO) Take 400 Units by mouth daily in the afternoon.  . cholestyramine (QUESTRAN) 4 g packet Take 4 g by mouth 2 (two) times daily between meals.  . Lactobacillus Probiotic TABS Take 2 tablets by mouth daily.  Marland Kitchen levothyroxine (SYNTHROID, LEVOTHROID) 25 MCG tablet Take 25 mcg by mouth daily before breakfast.  . lipase/protease/amylase (CREON) 36000 UNITS CPEP capsule Take 36,000 Units by mouth 2 (two) times daily. With snacks between meals at 10 am and 8 pm  . lipase/protease/amylase (CREON) 36000 UNITS CPEP capsule Take 72,000 Units by mouth 3 (three) times daily with meals. 8 am, 12 pm, and 6 pm  . loperamide (IMODIUM A-D) 2 MG tablet Take 4 mg by mouth 3 (three) times daily. For diarrhea  . losartan (COZAAR) 50 MG tablet Take 1 tablet (50 mg total) by mouth daily.  . Multiple Vitamins-Minerals (MULTIVITAMIN WOMEN 50+ PO) Take by mouth daily.   . NON FORMULARY Regular diet no dairy products.  . NON FORMULARY Wanderguard #6599 to ankle for safety awareness. Check placement and function qshift. Special Instructions: Check placement and function qshift. Every Shift Day, Evening, Night  . potassium chloride SA (KLOR-CON) 20 MEQ tablet Take 20 mEq by mouth daily.      SIGNIFICANT DIAGNOSTIC EXAMS   PREVIOUS   08-10-20: t score: -3.758  12-21-20: KUBThere is a mild ileus noted. Mild to moderate increase feces in the colon. No renal stone is seen  12-25-20: KUB  There is a nonspecific bowel gas pattern without obstruction. No residual ileus is noted. Mild increased feces in the colon. No renal stone is seen.  02-05-21: ct of head:  1. No acute intracranial abnormality. Please note if concern for acute infarction MRI would be more sensitive. 2. Similar appearance of bifrontal encephalomalacia consistent with prior infarction/insult  02-05-21: MRI of brain:  No evidence of recent infarction, hemorrhage, or mass. Bifrontal encephalomalacia/gliosis. Additional chronic  microvascular ischemic changes. Questioned small left anterior clinoid process calcified meningioma on prior CT imaging is not well evaluated due to size and lack of contrast.  NO NEW EXAMS.     LABS REVIEWED PREVIOUS    07-20-20: vit B 12: 620  07-23-20: tsh 2.264 07-24-20: wbc 3.9; hgb 9.3; hct 29.3 mcv 105.4 plt 105; glucose 106; bun 25; creat 1.28; k+ 3.6; na++ 142; a 9.2 liver normal albumin 3.5  07-30-20: chol 116; ldl 49; trig 71; hdl 53; hgb a1c 5.1; hep C neg  08-07-20: wbc 3.5; hgb 9.0; hct 27.9; mcv 104.5 plt 112; glucose 86;  bun 27; creat 1.22; k+ 4.2; an++ 143; ca 9.0 liver normal albumin 3.2 08-14-20: wbc 2.8; hgb 9.4; hct 29.2; mcv 104.3 plt 101; glucose 71; bun 27; creat 1.41; k+ 4.3; na++ 140; ca 9.0 liver normal albumin 3.1  08-16-20: urine micro-albumin 9.3 08-21-20: wbc 3.9; hgb 9.5; hct 29.7 mcv 104.2 plt 86; glucose 86; bun 18; creat 1.09; k+ 3.7; na++ 144; ca 8.1; liver normal albumin 3.1  09-18-20: wbc 4.4; hgb 9.6; hct 29.7 mcv 102.4 plt 105; glucose 99; bun 20; creat 1.12; k+ 3.2; na++ 142; ca 8.8 liver normal albumin 3.2  10-16-20: wbc 3.9; hgb 10.2; hct 32.3; mcv 104.9 plt 95; glucose 98; bun 19; creat 1.25; k+ 3.9; na++ 139; ca 8.8 liver normal albumin 3.2  12-04-20: wbc 3.1; hgb 10.9; hct 34.6; mcv 103.0 plt 130; glucose 132; bun 27; creat 1.35; k+ 3.6; na++ 143; ca 9.3 liver normal albumin 3.5  12-25-20: wbc 2.7; hgb 10.1; hct 31.2 mcv 103.0 plt 120; glucose 108; bun 13; creat 1.53; k+ 3.9; na++ 138; ca 8.7 liver normal albumin 3.2 GFR 3 01-01-21: wbc 2.5; hgb 10.6; hct 32.1; mcv 100.6 plt 87; glucose 87; bun 27; creat 1.24; k+ 3.8; na++ 144; ca 9.3 GFR 45; liver normal albumin 3.4  02-05-21: wbc 3.9; hgb 10.2; hct 31.6; mcv 102.9 plt 91; glucose 118; bun 26; creat 1.29; k+ 3.8; na++ 138; ca 8.7 GFR43 liver normal albumin 3.0 urine culture: e-coli 02-07-21: hgb a1c 5.3 04-08-21: chol 142; ldl 66; trig 101; hdl 56 04-09-21: wbc 4.6; hgb 11.3; hct 35.3; mcv 100.6 plt 140; glucose  101; bun 20; creat 1.13; k+ 3.5; na++ 139; ca 8.5 GFR 50; liver normal albumin 3.4   NO NEW LABS.   Review of Systems  Constitutional:  Negative for malaise/fatigue.  Respiratory:  Negative for cough and shortness of breath.   Cardiovascular:  Negative for chest pain, palpitations and leg swelling.  Gastrointestinal:  Negative for abdominal pain, constipation and heartburn.  Musculoskeletal:  Negative for back pain, joint pain and myalgias.  Skin: Negative.   Neurological:  Negative for dizziness.  Psychiatric/Behavioral:  The patient is not nervous/anxious.    Physical Exam Constitutional:      General: She is not in acute distress.    Appearance: She is cachectic. She is not diaphoretic.  Neck:     Thyroid: No thyromegaly.  Cardiovascular:     Rate and Rhythm: Normal rate and regular rhythm.     Pulses: Normal pulses.     Heart sounds: Normal heart sounds.  Pulmonary:     Effort: Pulmonary effort is normal. No respiratory distress.     Breath sounds: Normal breath sounds.  Abdominal:     General: Bowel sounds are normal. There is no distension.     Palpations: Abdomen is soft.     Tenderness: There is no abdominal tenderness.  Musculoskeletal:        General: Normal range of motion.     Cervical back: Neck supple.     Right lower leg: No edema.     Left lower leg: No edema.  Lymphadenopathy:     Cervical: No cervical adenopathy.  Skin:    General: Skin is warm and dry.  Neurological:     Mental Status: She is alert. Mental status is at baseline.  Psychiatric:        Mood and Affect: Mood normal.     ASSESSMENT/ PLAN:  TODAY  Pancreatic insufficiency: is stable will continue questran 4  gm twice daily; creon 72,000 units with meals and imodium 2 mg three times daily   2. Macrocytic anemia: stable; hgb 9.6; vit B 12: 620; is off iron (due to diarrhea) will monitor   3. Vascular dementia with behavioral disturbance: is stable weight is 121 pounds; will continue  buspar 5 mg to help with anxiety. Is off aricept   4. Herpes: no recent outbreaks will continue acyclovir 400 mg twice daily    PREVIOUS   5. Failure to thrive in adult: is stable at this time her weight is stable at 120 pounds will continue to monitor her status.   6. Hypokalemia: k+ 3.5 will continue k+ 20 meq daily and will monitor  7. Hypothyroidism, unspecified type: is stable tsh 2.264 will continue synthroid 25 mcg daily   8. Hypertension associated with type 2 diabetes mellitus: is stable b/p 119/60 will continue cozaar 50 mg daily and asa 81 mg daily   9. Diabetes mellitus without complication: is stable hgb a1c 5.3 will monitor is on arb statin  10. Post menopausal osteoporosis t score -3.758 will continue calcium and vitamin d  11 Compression fracture of L1 vertebrae with routine healing subsequent encounter: (06/2017) is stable has prn tylenol   12. Thrombocytopenia: is stable plt 140 will monitor   13. Multiple myeloma without achieving remission: is without change: is followed by oncology  14. Aortic atherosclerosis: (ct 08-26-15) will  Monitor   15. First degree av block/bradycardia: is stable has been seen by cardiology will monitor        Ok Edwards NP [facility]  Contact [phone] Monday through Friday 8am- 5pm  After hours call [phone]

## 2021-06-19 LAB — KAPPA/LAMBDA LIGHT CHAINS
Kappa free light chain: 18 mg/L (ref 3.3–19.4)
Kappa, lambda light chain ratio: 0.92 (ref 0.26–1.65)
Lambda free light chains: 19.6 mg/L (ref 5.7–26.3)

## 2021-06-19 LAB — PROTEIN ELECTROPHORESIS, SERUM
A/G Ratio: 1.1 (ref 0.7–1.7)
Albumin ELP: 3.3 g/dL (ref 2.9–4.4)
Alpha-1-Globulin: 0.3 g/dL (ref 0.0–0.4)
Alpha-2-Globulin: 0.8 g/dL (ref 0.4–1.0)
Beta Globulin: 0.8 g/dL (ref 0.7–1.3)
Gamma Globulin: 1 g/dL (ref 0.4–1.8)
Globulin, Total: 2.9 g/dL (ref 2.2–3.9)
M-Spike, %: 0.6 g/dL — ABNORMAL HIGH
Total Protein ELP: 6.2 g/dL (ref 6.0–8.5)

## 2021-06-20 DIAGNOSIS — Z1159 Encounter for screening for other viral diseases: Secondary | ICD-10-CM | POA: Diagnosis not present

## 2021-06-20 DIAGNOSIS — C8590 Non-Hodgkin lymphoma, unspecified, unspecified site: Secondary | ICD-10-CM | POA: Diagnosis not present

## 2021-06-25 ENCOUNTER — Ambulatory Visit (HOSPITAL_COMMUNITY): Payer: Medicare PPO | Admitting: Hematology and Oncology

## 2021-06-25 ENCOUNTER — Inpatient Hospital Stay (HOSPITAL_COMMUNITY): Payer: Medicare PPO

## 2021-06-25 VITALS — BP 132/78 | HR 74 | Temp 96.9°F | Resp 18 | Wt 118.8 lb

## 2021-06-25 DIAGNOSIS — C8590 Non-Hodgkin lymphoma, unspecified, unspecified site: Secondary | ICD-10-CM | POA: Diagnosis not present

## 2021-06-25 DIAGNOSIS — Z5111 Encounter for antineoplastic chemotherapy: Secondary | ICD-10-CM | POA: Diagnosis not present

## 2021-06-25 DIAGNOSIS — C9000 Multiple myeloma not having achieved remission: Secondary | ICD-10-CM

## 2021-06-25 DIAGNOSIS — Z1159 Encounter for screening for other viral diseases: Secondary | ICD-10-CM | POA: Diagnosis not present

## 2021-06-25 LAB — COMPREHENSIVE METABOLIC PANEL
ALT: 20 U/L (ref 0–44)
AST: 28 U/L (ref 15–41)
Albumin: 3.5 g/dL (ref 3.5–5.0)
Alkaline Phosphatase: 68 U/L (ref 38–126)
Anion gap: 8 (ref 5–15)
BUN: 17 mg/dL (ref 8–23)
CO2: 25 mmol/L (ref 22–32)
Calcium: 9.1 mg/dL (ref 8.9–10.3)
Chloride: 107 mmol/L (ref 98–111)
Creatinine, Ser: 1.25 mg/dL — ABNORMAL HIGH (ref 0.44–1.00)
GFR, Estimated: 45 mL/min — ABNORMAL LOW (ref >60)
Glucose, Bld: 114 mg/dL — ABNORMAL HIGH (ref 70–99)
Potassium: 3.5 mmol/L (ref 3.5–5.1)
Sodium: 140 mmol/L (ref 135–145)
Total Bilirubin: 0.5 mg/dL (ref 0.3–1.2)
Total Protein: 6.4 g/dL — ABNORMAL LOW (ref 6.5–8.1)

## 2021-06-25 LAB — CBC WITH DIFFERENTIAL/PLATELET
Abs Immature Granulocytes: 0.01 10*3/uL (ref 0.00–0.07)
Basophils Absolute: 0 10*3/uL (ref 0.0–0.1)
Basophils Relative: 0 %
Eosinophils Absolute: 0.1 10*3/uL (ref 0.0–0.5)
Eosinophils Relative: 1 %
HCT: 34.9 % — ABNORMAL LOW (ref 36.0–46.0)
Hemoglobin: 11.1 g/dL — ABNORMAL LOW (ref 12.0–15.0)
Immature Granulocytes: 0 %
Lymphocytes Relative: 13 %
Lymphs Abs: 0.5 10*3/uL — ABNORMAL LOW (ref 0.7–4.0)
MCH: 31.1 pg (ref 26.0–34.0)
MCHC: 31.8 g/dL (ref 30.0–36.0)
MCV: 97.8 fL (ref 80.0–100.0)
Monocytes Absolute: 0.3 10*3/uL (ref 0.1–1.0)
Monocytes Relative: 9 %
Neutro Abs: 2.7 10*3/uL (ref 1.7–7.7)
Neutrophils Relative %: 77 %
Platelets: 116 10*3/uL — ABNORMAL LOW (ref 150–400)
RBC: 3.57 MIL/uL — ABNORMAL LOW (ref 3.87–5.11)
RDW: 16.4 % — ABNORMAL HIGH (ref 11.5–15.5)
WBC: 3.6 10*3/uL — ABNORMAL LOW (ref 4.0–10.5)
nRBC: 0 % (ref 0.0–0.2)

## 2021-06-25 MED ORDER — BORTEZOMIB CHEMO SQ INJECTION 3.5 MG (2.5MG/ML)
1.3000 mg/m2 | Freq: Once | INTRAMUSCULAR | Status: AC
Start: 2021-06-25 — End: 2021-06-25
  Administered 2021-06-25: 2 mg via SUBCUTANEOUS
  Filled 2021-06-25: qty 0.8

## 2021-06-25 MED ORDER — DEXAMETHASONE 4 MG PO TABS
ORAL_TABLET | ORAL | Status: AC
  Filled 2021-06-25: qty 3

## 2021-06-25 MED ORDER — DEXAMETHASONE 4 MG PO TABS
10.0000 mg | ORAL_TABLET | Freq: Once | ORAL | Status: AC
Start: 2021-06-25 — End: 2021-06-25
  Administered 2021-06-25: 10 mg via ORAL

## 2021-06-25 NOTE — Patient Instructions (Signed)
[place]  Discharge Instructions: Thank you for choosing [facility] to provide your oncology and hematology care.  If you have a lab appointment with the [facility], please come in thru the Main Entrance and check in at the main information desk.  Wear comfortable clothing and clothing appropriate for easy access to any Portacath or PICC line.   We strive to give you quality time with your provider. You may need to reschedule your appointment if you arrive late (15 or more minutes).  Arriving late affects you and other patients whose appointments are after yours.  Also, if you miss three or more appointments without notifying the office, you may be dismissed from the clinic at the provider's discretion.      For prescription refill requests, have your pharmacy contact our office and allow 72 hours for refills to be completed.    Today you received the following chemotherapy and/or immunotherapy agents Velcade injection      To help prevent nausea and vomiting after your treatment, we encourage you to take your nausea medication as directed.  BELOW ARE SYMPTOMS THAT SHOULD BE REPORTED IMMEDIATELY: *FEVER GREATER THAN 100.4 F (38 C) OR HIGHER *CHILLS OR SWEATING *NAUSEA AND VOMITING THAT IS NOT CONTROLLED WITH YOUR NAUSEA MEDICATION *UNUSUAL SHORTNESS OF BREATH *UNUSUAL BRUISING OR BLEEDING *URINARY PROBLEMS (pain or burning when urinating, or frequent urination) *BOWEL PROBLEMS (unusual diarrhea, constipation, pain near the anus) TENDERNESS IN MOUTH AND THROAT WITH OR WITHOUT PRESENCE OF ULCERS (sore throat, sores in mouth, or a toothache) UNUSUAL RASH, SWELLING OR PAIN  UNUSUAL VAGINAL DISCHARGE OR ITCHING   Items with * indicate a potential emergency and should be followed up as soon as possible or go to the Emergency Department if any problems should occur.  Please show the CHEMOTHERAPY ALERT CARD or IMMUNOTHERAPY ALERT CARD at check-in to the  Emergency Department and triage nurse.  Should you have questions after your visit or need to cancel or reschedule your appointment, please contact [facility] [phone]  and follow the prompts.  Office hours are 8:00 a.m. to 4:30 p.m. Monday - Friday. Please note that voicemails left after 4:00 p.m. may not be returned until the following business day.  We are closed weekends and major holidays. You have access to a nurse at all times for urgent questions. Please call the main number to the clinic [phone] and follow the prompts.  For any non-urgent questions, you may also contact your provider using MyChart. We now offer e-Visits for anyone 71 and older to request care online for non-urgent symptoms. For details visit [email or web address].   Also download the MyChart app! Go to the app store, search "MyChart", open the app, select [facility], and log in with your MyChart username and password.  Due to Covid, a mask is required upon entering the hospital/clinic. If you do not have a mask, one will be given to you upon arrival. For doctor visits, patients may have 1 support person aged 64 or older with them. For treatment visits, patients cannot have anyone with them due to current Covid guidelines and our immunocompromised population.

## 2021-06-25 NOTE — Progress Notes (Signed)
Patient presents today for Velcade injection per providers order.  Vital signs within parameters for treatment.  Labs pending.  Labs within parameters for treatment.  Velcade administration without incident; injection site WNL; see MAR for injection details.  Patient tolerated procedure well and without incident.  No questions or complaints noted at this time. Discharge from clinic via wheelchair in stable condition.  Alert and oriented X 3.  Follow up with [facility] as scheduled.

## 2021-06-27 DIAGNOSIS — Z1159 Encounter for screening for other viral diseases: Secondary | ICD-10-CM | POA: Diagnosis not present

## 2021-06-27 DIAGNOSIS — C8590 Non-Hodgkin lymphoma, unspecified, unspecified site: Secondary | ICD-10-CM | POA: Diagnosis not present

## 2021-07-02 ENCOUNTER — Inpatient Hospital Stay (HOSPITAL_COMMUNITY): Payer: Medicare PPO

## 2021-07-02 ENCOUNTER — Other Ambulatory Visit: Payer: Self-pay

## 2021-07-02 VITALS — BP 146/79 | HR 87 | Temp 96.8°F | Resp 18 | Wt 113.4 lb

## 2021-07-02 DIAGNOSIS — C8590 Non-Hodgkin lymphoma, unspecified, unspecified site: Secondary | ICD-10-CM | POA: Diagnosis not present

## 2021-07-02 DIAGNOSIS — Z1159 Encounter for screening for other viral diseases: Secondary | ICD-10-CM | POA: Diagnosis not present

## 2021-07-02 DIAGNOSIS — C9000 Multiple myeloma not having achieved remission: Secondary | ICD-10-CM

## 2021-07-02 DIAGNOSIS — Z5111 Encounter for antineoplastic chemotherapy: Secondary | ICD-10-CM | POA: Diagnosis not present

## 2021-07-02 LAB — COMPREHENSIVE METABOLIC PANEL
ALT: 22 U/L (ref 0–44)
AST: 31 U/L (ref 15–41)
Albumin: 3.5 g/dL (ref 3.5–5.0)
Alkaline Phosphatase: 64 U/L (ref 38–126)
Anion gap: 10 (ref 5–15)
BUN: 22 mg/dL (ref 8–23)
CO2: 24 mmol/L (ref 22–32)
Calcium: 8.9 mg/dL (ref 8.9–10.3)
Chloride: 105 mmol/L (ref 98–111)
Creatinine, Ser: 1.2 mg/dL — ABNORMAL HIGH (ref 0.44–1.00)
GFR, Estimated: 47 mL/min — ABNORMAL LOW (ref >60)
Glucose, Bld: 78 mg/dL (ref 70–99)
Potassium: 4.2 mmol/L (ref 3.5–5.1)
Sodium: 139 mmol/L (ref 135–145)
Total Bilirubin: 0.6 mg/dL (ref 0.3–1.2)
Total Protein: 6.7 g/dL (ref 6.5–8.1)

## 2021-07-02 LAB — CBC WITH DIFFERENTIAL/PLATELET
Abs Immature Granulocytes: 0.01 10*3/uL (ref 0.00–0.07)
Basophils Absolute: 0 10*3/uL (ref 0.0–0.1)
Basophils Relative: 0 %
Eosinophils Absolute: 0.1 10*3/uL (ref 0.0–0.5)
Eosinophils Relative: 1 %
HCT: 35.3 % — ABNORMAL LOW (ref 36.0–46.0)
Hemoglobin: 11.3 g/dL — ABNORMAL LOW (ref 12.0–15.0)
Immature Granulocytes: 0 %
Lymphocytes Relative: 12 %
Lymphs Abs: 0.7 10*3/uL (ref 0.7–4.0)
MCH: 31.3 pg (ref 26.0–34.0)
MCHC: 32 g/dL (ref 30.0–36.0)
MCV: 97.8 fL (ref 80.0–100.0)
Monocytes Absolute: 0.8 10*3/uL (ref 0.1–1.0)
Monocytes Relative: 13 %
Neutro Abs: 4.2 10*3/uL (ref 1.7–7.7)
Neutrophils Relative %: 74 %
Platelets: 95 10*3/uL — ABNORMAL LOW (ref 150–400)
RBC: 3.61 MIL/uL — ABNORMAL LOW (ref 3.87–5.11)
RDW: 16.7 % — ABNORMAL HIGH (ref 11.5–15.5)
WBC: 5.7 10*3/uL (ref 4.0–10.5)
nRBC: 0 % (ref 0.0–0.2)

## 2021-07-02 LAB — LACTATE DEHYDROGENASE: LDH: 238 U/L — ABNORMAL HIGH (ref 98–192)

## 2021-07-02 MED ORDER — DEXAMETHASONE 4 MG PO TABS
10.0000 mg | ORAL_TABLET | Freq: Once | ORAL | Status: AC
  Administered 2021-07-02: 10 mg via ORAL
  Filled 2021-07-02: qty 3

## 2021-07-02 MED ORDER — BORTEZOMIB CHEMO SQ INJECTION 3.5 MG (2.5MG/ML)
1.3000 mg/m2 | Freq: Once | INTRAMUSCULAR | Status: AC
  Administered 2021-07-02: 2 mg via SUBCUTANEOUS
  Filled 2021-07-02: qty 0.8

## 2021-07-02 NOTE — Patient Instructions (Signed)
[place]  Discharge Instructions: Thank you for choosing [facility] to provide your oncology and hematology care.  If you have a lab appointment with the [facility], please come in thru the Main Entrance and check in at the main information desk.  Wear comfortable clothing and clothing appropriate for easy access to any Portacath or PICC line.   We strive to give you quality time with your provider. You may need to reschedule your appointment if you arrive late (15 or more minutes).  Arriving late affects you and other patients whose appointments are after yours.  Also, if you miss three or more appointments without notifying the office, you may be dismissed from the clinic at the provider's discretion.      For prescription refill requests, have your pharmacy contact our office and allow 72 hours for refills to be completed.    Today you received the following chemotherapy and/or immunotherapy agents Velcade.   To help prevent nausea and vomiting after your treatment, we encourage you to take your nausea medication as directed.  BELOW ARE SYMPTOMS THAT SHOULD BE REPORTED IMMEDIATELY: *FEVER GREATER THAN 100.4 F (38 C) OR HIGHER *CHILLS OR SWEATING *NAUSEA AND VOMITING THAT IS NOT CONTROLLED WITH YOUR NAUSEA MEDICATION *UNUSUAL SHORTNESS OF BREATH *UNUSUAL BRUISING OR BLEEDING *URINARY PROBLEMS (pain or burning when urinating, or frequent urination) *BOWEL PROBLEMS (unusual diarrhea, constipation, pain near the anus) TENDERNESS IN MOUTH AND THROAT WITH OR WITHOUT PRESENCE OF ULCERS (sore throat, sores in mouth, or a toothache) UNUSUAL RASH, SWELLING OR PAIN  UNUSUAL VAGINAL DISCHARGE OR ITCHING   Items with * indicate a potential emergency and should be followed up as soon as possible or go to the Emergency Department if any problems should occur.  Please show the CHEMOTHERAPY ALERT CARD or IMMUNOTHERAPY ALERT CARD at check-in to the Emergency Department  and triage nurse.  Should you have questions after your visit or need to cancel or reschedule your appointment, please contact [facility] [phone]  and follow the prompts.  Office hours are 8:00 a.m. to 4:30 p.m. Monday - Friday. Please note that voicemails left after 4:00 p.m. may not be returned until the following business day.  We are closed weekends and major holidays. You have access to a nurse at all times for urgent questions. Please call the main number to the clinic [phone] and follow the prompts.  For any non-urgent questions, you may also contact your provider using MyChart. We now offer e-Visits for anyone 77 and older to request care online for non-urgent symptoms. For details visit [email or web address].   Also download the MyChart app! Go to the app store, search "MyChart", open the app, select [place] [facility] [place], and log in with your MyChart username and password.  Due to Covid, a mask is required upon entering the hospital/clinic. If you do not have a mask, one will be given to you upon arrival. For doctor visits, patients may have 1 support person aged 77 or older with them. For treatment visits, patients cannot have anyone with them due to current Covid guidelines and our immunocompromised population.

## 2021-07-02 NOTE — Progress Notes (Signed)
Pt here today for Velcade injection. Okay for treatment per Dr. Lysbeth Penner injection given today per MD orders. Tolerated infusion without adverse affects. Vital signs stable. No complaints at this time. Discharged from clinic ambulatory in stable condition. Alert and oriented x 3. F/U with [facility] as scheduled.

## 2021-07-03 LAB — KAPPA/LAMBDA LIGHT CHAINS
Kappa free light chain: 16.7 mg/L (ref 3.3–19.4)
Kappa, lambda light chain ratio: 0.98 (ref 0.26–1.65)
Lambda free light chains: 17.1 mg/L (ref 5.7–26.3)

## 2021-07-04 DIAGNOSIS — C8590 Non-Hodgkin lymphoma, unspecified, unspecified site: Secondary | ICD-10-CM | POA: Diagnosis not present

## 2021-07-04 DIAGNOSIS — Z1159 Encounter for screening for other viral diseases: Secondary | ICD-10-CM | POA: Diagnosis not present

## 2021-07-04 LAB — PROTEIN ELECTROPHORESIS, SERUM
A/G Ratio: 1.3 (ref 0.7–1.7)
Albumin ELP: 3.5 g/dL (ref 2.9–4.4)
Alpha-1-Globulin: 0.2 g/dL (ref 0.0–0.4)
Alpha-2-Globulin: 0.7 g/dL (ref 0.4–1.0)
Beta Globulin: 0.8 g/dL (ref 0.7–1.3)
Gamma Globulin: 1 g/dL (ref 0.4–1.8)
Globulin, Total: 2.8 g/dL (ref 2.2–3.9)
M-Spike, %: 0.6 g/dL — ABNORMAL HIGH
Total Protein ELP: 6.3 g/dL (ref 6.0–8.5)

## 2021-07-09 DIAGNOSIS — M25561 Pain in right knee: Secondary | ICD-10-CM | POA: Diagnosis not present

## 2021-07-09 DIAGNOSIS — C8590 Non-Hodgkin lymphoma, unspecified, unspecified site: Secondary | ICD-10-CM | POA: Diagnosis not present

## 2021-07-09 DIAGNOSIS — Z1159 Encounter for screening for other viral diseases: Secondary | ICD-10-CM | POA: Diagnosis not present

## 2021-07-09 DIAGNOSIS — R2241 Localized swelling, mass and lump, right lower limb: Secondary | ICD-10-CM | POA: Diagnosis not present

## 2021-07-11 DIAGNOSIS — C8590 Non-Hodgkin lymphoma, unspecified, unspecified site: Secondary | ICD-10-CM | POA: Diagnosis not present

## 2021-07-11 DIAGNOSIS — Z1159 Encounter for screening for other viral diseases: Secondary | ICD-10-CM | POA: Diagnosis not present

## 2021-07-15 ENCOUNTER — Inpatient Hospital Stay (HOSPITAL_COMMUNITY): Payer: Medicare PPO | Attending: Hematology

## 2021-07-15 ENCOUNTER — Other Ambulatory Visit (HOSPITAL_COMMUNITY)
Admission: RE | Admit: 2021-07-15 | Discharge: 2021-07-15 | Disposition: A | Discharge status: Home / Self Care | Payer: Medicare PPO | Source: Skilled Nursing Facility | Attending: Adult Health | Admitting: Adult Health

## 2021-07-15 DIAGNOSIS — D631 Anemia in chronic kidney disease: Secondary | ICD-10-CM | POA: Insufficient documentation

## 2021-07-15 DIAGNOSIS — Z5111 Encounter for antineoplastic chemotherapy: Secondary | ICD-10-CM | POA: Insufficient documentation

## 2021-07-15 DIAGNOSIS — E039 Hypothyroidism, unspecified: Secondary | ICD-10-CM | POA: Diagnosis not present

## 2021-07-15 DIAGNOSIS — L602 Onychogryphosis: Secondary | ICD-10-CM | POA: Diagnosis not present

## 2021-07-15 DIAGNOSIS — F0390 Unspecified dementia without behavioral disturbance: Secondary | ICD-10-CM | POA: Insufficient documentation

## 2021-07-15 DIAGNOSIS — I129 Hypertensive chronic kidney disease with stage 1 through stage 4 chronic kidney disease, or unspecified chronic kidney disease: Secondary | ICD-10-CM | POA: Diagnosis not present

## 2021-07-15 DIAGNOSIS — C9000 Multiple myeloma not having achieved remission: Secondary | ICD-10-CM

## 2021-07-15 DIAGNOSIS — Z8572 Personal history of non-Hodgkin lymphomas: Secondary | ICD-10-CM | POA: Insufficient documentation

## 2021-07-15 DIAGNOSIS — N189 Chronic kidney disease, unspecified: Secondary | ICD-10-CM | POA: Insufficient documentation

## 2021-07-15 DIAGNOSIS — D509 Iron deficiency anemia, unspecified: Secondary | ICD-10-CM | POA: Insufficient documentation

## 2021-07-15 DIAGNOSIS — E119 Type 2 diabetes mellitus without complications: Secondary | ICD-10-CM | POA: Insufficient documentation

## 2021-07-15 DIAGNOSIS — E1140 Type 2 diabetes mellitus with diabetic neuropathy, unspecified: Secondary | ICD-10-CM | POA: Diagnosis not present

## 2021-07-15 LAB — CBC WITH DIFFERENTIAL/PLATELET
Abs Immature Granulocytes: 0.02 10*3/uL (ref 0.00–0.07)
Basophils Absolute: 0 10*3/uL (ref 0.0–0.1)
Basophils Relative: 0 %
Eosinophils Absolute: 0 10*3/uL (ref 0.0–0.5)
Eosinophils Relative: 1 %
HCT: 34.3 % — ABNORMAL LOW (ref 36.0–46.0)
Hemoglobin: 10.9 g/dL — ABNORMAL LOW (ref 12.0–15.0)
Immature Granulocytes: 0 %
Lymphocytes Relative: 10 %
Lymphs Abs: 0.5 10*3/uL — ABNORMAL LOW (ref 0.7–4.0)
MCH: 31.1 pg (ref 26.0–34.0)
MCHC: 31.8 g/dL (ref 30.0–36.0)
MCV: 98 fL (ref 80.0–100.0)
Monocytes Absolute: 0.4 10*3/uL (ref 0.1–1.0)
Monocytes Relative: 8 %
Neutro Abs: 3.6 10*3/uL (ref 1.7–7.7)
Neutrophils Relative %: 81 %
Platelets: 189 10*3/uL (ref 150–400)
RBC: 3.5 MIL/uL — ABNORMAL LOW (ref 3.87–5.11)
RDW: 16.5 % — ABNORMAL HIGH (ref 11.5–15.5)
WBC: 4.5 10*3/uL (ref 4.0–10.5)
nRBC: 0 % (ref 0.0–0.2)

## 2021-07-15 LAB — CBC
HCT: 35.9 % — ABNORMAL LOW (ref 36.0–46.0)
Hemoglobin: 11.7 g/dL — ABNORMAL LOW (ref 12.0–15.0)
MCH: 31.6 pg (ref 26.0–34.0)
MCHC: 32.6 g/dL (ref 30.0–36.0)
MCV: 97 fL (ref 80.0–100.0)
Platelets: 220 10*3/uL (ref 150–400)
RBC: 3.7 MIL/uL — ABNORMAL LOW (ref 3.87–5.11)
RDW: 16.5 % — ABNORMAL HIGH (ref 11.5–15.5)
WBC: 4.7 10*3/uL (ref 4.0–10.5)
nRBC: 0 % (ref 0.0–0.2)

## 2021-07-15 LAB — COMPREHENSIVE METABOLIC PANEL
ALT: 17 U/L (ref 0–44)
AST: 26 U/L (ref 15–41)
Albumin: 3.2 g/dL — ABNORMAL LOW (ref 3.5–5.0)
Alkaline Phosphatase: 67 U/L (ref 38–126)
Anion gap: 7 (ref 5–15)
BUN: 21 mg/dL (ref 8–23)
CO2: 27 mmol/L (ref 22–32)
Calcium: 8.8 mg/dL — ABNORMAL LOW (ref 8.9–10.3)
Chloride: 104 mmol/L (ref 98–111)
Creatinine, Ser: 1.23 mg/dL — ABNORMAL HIGH (ref 0.44–1.00)
GFR, Estimated: 46 mL/min — ABNORMAL LOW (ref >60)
Glucose, Bld: 161 mg/dL — ABNORMAL HIGH (ref 70–99)
Potassium: 3.9 mmol/L (ref 3.5–5.1)
Sodium: 138 mmol/L (ref 135–145)
Total Bilirubin: 0.6 mg/dL (ref 0.3–1.2)
Total Protein: 6.5 g/dL (ref 6.5–8.1)

## 2021-07-15 LAB — URIC ACID: Uric Acid, Serum: 4.4 mg/dL (ref 2.5–7.1)

## 2021-07-15 LAB — LACTATE DEHYDROGENASE: LDH: 192 U/L (ref 98–192)

## 2021-07-15 NOTE — Progress Notes (Signed)
[facility] [place], [place] [address]   CLINIC:  Medical Oncology/Hematology  PCP:  Gerlene Fee, NP [address] [place] [address] [phone]   REASON FOR VISIT:  Follow-up for multiple myeloma  PRIOR THERAPY: none  NGS Results: not done  CURRENT THERAPY: Velcade & Decadron 3/4 weeks; Revlimid 2/4 weeks  BRIEF ONCOLOGIC HISTORY:  Oncology History  Multiple myeloma ([facility])  07/09/2017 Initial Diagnosis   Multiple myeloma ([facility])    06/01/2018 -  Chemotherapy    Patient is on Treatment Plan: MYELOMA MAINTENANCE BORTEZOMIB SQ D1,8,15 / DEXAMETHASONE ORAL D1,8,15 Q28D          CANCER STAGING: Cancer Staging No matching staging information was found for the patient.  INTERVAL HISTORY:  Ms. DUANNA RUNK, a 75 y.o. female, returns for routine follow-up and consideration for next cycle of chemotherapy. Niomi was last seen on 04/30/21.  Due for cycle #41 of Velcade & Decadron today.   Overall, she tells me she has been feeling pretty good. She denies n/v/d, tingling or numbness in her hands and feet, fatigue, leg swellings, and reports normal BM 2 times daily. She also reports good appetite.   Overall, she feels ready for next cycle of chemo today.   REVIEW OF SYSTEMS:  Review of Systems  Constitutional:  Negative for appetite change and fatigue.  Gastrointestinal:  Negative for diarrhea, nausea and vomiting.  Neurological:  Negative for numbness.  All other systems reviewed and are negative.  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[facility])    Diabetes mellitus without complication ([facility])    H/O partial nephrectomy    Hypokalemia    Hypothyroidism    Impaired cognition    Multiple myeloma ([facility])    Dr Maylon Peppers, [facility]   Thyroid disease    Past Surgical History:  Procedure Laterality Date   ABDOMINAL HYSTERECTOMY      CRANIOTOMY     for lymphoma   YAG LASER APPLICATION Right 08/01/5630   Procedure: YAG LASER APPLICATION;  Surgeon: [name], MD;  Location: [facility];  Service: Ophthalmology;  Laterality: Right;    SOCIAL HISTORY:  Social History   Socioeconomic History   Marital status: Single    Spouse name: Not on file   Number of children: Not on file   Years of education: Not on file   Highest education level: Not on file  Occupational History   Occupation: retired   Tobacco Use   Smoking status: Never   Smokeless tobacco: Never  Vaping Use   Vaping Use: Never used  Substance and Sexual Activity   Alcohol use: No   Drug use: No   Sexual activity: Never  Other Topics Concern   Not on file  Social History Narrative   Long term resident of [facility]    Social Determinants of Health   Financial Resource Strain: Low Risk    Difficulty of Paying Living Expenses: Not very hard  Food Insecurity: No Food Insecurity   Worried About Charity fundraiser in the Last Year: Never true   [place] in the Last Year: Never true  Transportation Needs: No Transportation Needs   Lack of Transportation (Medical): No   Lack of Transportation (Non-Medical): No  Physical Activity: Inactive   Days of Exercise per Week: 0 days   Minutes of Exercise  per Session: 0 min  Stress: No Stress Concern Present   Feeling of Stress : Not at all  Social Connections: Moderately Isolated   Frequency of Communication with Friends and Family: More than three times a week   Frequency of Social Gatherings with Friends and Family: Once a week   Attends Religious Services: More than 4 times per year   Active Member of [employer] or Organizations: No   Attends Archivist Meetings: Never   Marital Status: Never married  Human resources officer Violence: Not on file    FAMILY HISTORY:  Family History  Problem Relation Age of Onset   Depression Mother    Diabetes Mother    Heart disease Father    Cancer - Prostate  Brother    Cancer Brother    Cancer Sister     CURRENT MEDICATIONS:  No current facility-administered medications for this visit.   No current outpatient medications on file.   Facility-Administered Medications Ordered in Other Visits  Medication Dose Route Frequency Provider Last Rate Last Admin   prochlorperazine (COMPAZINE) tablet 10 mg  10 mg Oral Once Derek Jack, MD        ALLERGIES:  Allergies  Allergen Reactions   Lotensin [Benazepril]     PHYSICAL EXAM:  Performance status (ECOG): 2 - Symptomatic, <50% confined to bed  There were no vitals filed for this visit. Wt Readings from Last 3 Encounters:  07/02/21 113 lb 6.4 oz (51.4 kg)  06/25/21 118 lb 12.8 oz (53.9 kg)  06/18/21 119 lb 9.6 oz (54.3 kg)   Physical Exam Vitals reviewed.  Constitutional:      Appearance: Normal appearance.  Cardiovascular:     Rate and Rhythm: Normal rate and regular rhythm.     Pulses: Normal pulses.     Heart sounds: Normal heart sounds.  Pulmonary:     Effort: Pulmonary effort is normal.     Breath sounds: Normal breath sounds.  Musculoskeletal:     Right lower leg: No edema.     Left lower leg: No edema.  Skin:    Findings: No rash.  Neurological:     General: No focal deficit present.     Mental Status: She is alert and oriented to person, place, and time.  Psychiatric:        Mood and Affect: Mood normal.        Behavior: Behavior normal.    LABORATORY DATA:  I have reviewed the labs as listed.  CBC Latest Ref Rng & Units 07/02/2021 06/25/2021 06/18/2021  WBC 4.0 - 10.5 K/uL 5.7 3.6(L) 4.5  Hemoglobin 12.0 - 15.0 g/dL 11.3(L) 11.1(L) 10.6(L)  Hematocrit 36.0 - 46.0 % 35.3(L) 34.9(L) 33.7(L)  Platelets 150 - 400 K/uL 95(L) 116(L) 174   CMP Latest Ref Rng & Units 07/02/2021 06/25/2021 06/18/2021  Glucose 70 - 99 mg/dL 78 114(H) 111(H)  BUN 8 - 23 mg/dL '22 17 22  ' Creatinine 0.44 - 1.00 mg/dL 1.20(H) 1.25(H) 1.20(H)  Sodium 135 - 145 mmol/L 139 140 141  Potassium  3.5 - 5.1 mmol/L 4.2 3.5 3.7  Chloride 98 - 111 mmol/L 105 107 111  CO2 22 - 32 mmol/L '24 25 23  ' Calcium 8.9 - 10.3 mg/dL 8.9 9.1 8.9  Total Protein 6.5 - 8.1 g/dL 6.7 6.4(L) 6.5  Total Bilirubin 0.3 - 1.2 mg/dL 0.6 0.5 0.4  Alkaline Phos 38 - 126 U/L 64 68 67  AST 15 - 41 U/L '31 28 31  ' ALT 0 - 44  U/L '22 20 18    ' DIAGNOSTIC IMAGING:  I have independently reviewed the scans and discussed with the patient. No results found.   ASSESSMENT:  1.  IgG lambda multiple myeloma: -She is on Revlimid 10 mg 2 weeks on/2 weeks of along with Velcade 3 weeks on/1 week off.  Dexamethasone 10 mg on days of Velcade. -Myeloma panel on 04/17/2020 shows M spike 0.5 g.  Kappa light chains are 44.  Lambda light chains are 34.  Ratio is 1.3. -Resident of [facility] since 07/19/2020.   2.  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   PLAN:  1.  IgG lambda multiple myeloma: - She is tolerating Velcade day 1, 8, 15 every 21 days very well. - We have reviewed myeloma panel from 07/02/2021.  M spike is 0.6 and stable.  Free light chain ratio is normal.  Kappa light chains are 16.7. - Labs reviewed from yesterday showed creatinine 1.23 and normal LFTs.  CBC was grossly normal. - We have discontinued Revlimid secondary to diarrhea.  She no longer has diarrhea at this time. - She does not report any neuropathy from Velcade.  No severe fatigue reported. - We will continue the same regimen along with dexamethasone 10 mg on injection days. - RTC 8 weeks for follow-up with repeat myeloma panel.   2.  CKD: - Creatinine today is 1.23.  It is around her baseline of 1.2-1.3.   3.  Weight loss: - She is eating better and weight has been stable.   4.  Dementia: - Continue Aricept 10 mg daily.   5.  Shingles prophylaxis: - Continue acyclovir 400 mg twice daily.   6.  Myeloma bone disease: - She was treated with Zometa for several years which was discontinued.   7.  Normocytic  anemia: - This is from myelosuppression from treatments and CKD.  Hemoglobin is 11.7.   8.  Diarrhea: - Diarrhea has improved while she is off of Revlimid.  Previous GI work-up was negative.  She is not taking Questran.   Orders placed this encounter:  No orders of the defined types were placed in this encounter.    Derek Jack, MD [place] [phone]   I, Thana Ates, am acting as a scribe for Dr. Derek Jack.  I, Derek Jack MD, have reviewed the above documentation for accuracy and completeness, and I agree with the above.

## 2021-07-16 ENCOUNTER — Encounter: Payer: Self-pay | Admitting: Adult Health

## 2021-07-16 ENCOUNTER — Inpatient Hospital Stay (HOSPITAL_BASED_OUTPATIENT_CLINIC_OR_DEPARTMENT_OTHER): Payer: Medicare PPO | Admitting: Hematology

## 2021-07-16 ENCOUNTER — Non-Acute Institutional Stay (SKILLED_NURSING_FACILITY): Payer: Medicare PPO | Admitting: Adult Health

## 2021-07-16 ENCOUNTER — Inpatient Hospital Stay (HOSPITAL_COMMUNITY): Payer: Medicare PPO

## 2021-07-16 VITALS — BP 115/67 | HR 72 | Temp 96.7°F | Resp 16 | Wt 116.4 lb

## 2021-07-16 DIAGNOSIS — Z1159 Encounter for screening for other viral diseases: Secondary | ICD-10-CM | POA: Diagnosis not present

## 2021-07-16 DIAGNOSIS — N189 Chronic kidney disease, unspecified: Secondary | ICD-10-CM | POA: Diagnosis not present

## 2021-07-16 DIAGNOSIS — I129 Hypertensive chronic kidney disease with stage 1 through stage 4 chronic kidney disease, or unspecified chronic kidney disease: Secondary | ICD-10-CM | POA: Diagnosis not present

## 2021-07-16 DIAGNOSIS — E119 Type 2 diabetes mellitus without complications: Secondary | ICD-10-CM | POA: Diagnosis not present

## 2021-07-16 DIAGNOSIS — C9000 Multiple myeloma not having achieved remission: Secondary | ICD-10-CM | POA: Diagnosis not present

## 2021-07-16 DIAGNOSIS — M10361 Gout due to renal impairment, right knee: Secondary | ICD-10-CM | POA: Insufficient documentation

## 2021-07-16 DIAGNOSIS — Z5111 Encounter for antineoplastic chemotherapy: Secondary | ICD-10-CM | POA: Diagnosis not present

## 2021-07-16 DIAGNOSIS — D631 Anemia in chronic kidney disease: Secondary | ICD-10-CM | POA: Diagnosis not present

## 2021-07-16 DIAGNOSIS — E1122 Type 2 diabetes mellitus with diabetic chronic kidney disease: Secondary | ICD-10-CM

## 2021-07-16 DIAGNOSIS — N183 Chronic kidney disease, stage 3 unspecified: Secondary | ICD-10-CM | POA: Diagnosis not present

## 2021-07-16 DIAGNOSIS — C8590 Non-Hodgkin lymphoma, unspecified, unspecified site: Secondary | ICD-10-CM | POA: Diagnosis not present

## 2021-07-16 DIAGNOSIS — E039 Hypothyroidism, unspecified: Secondary | ICD-10-CM | POA: Diagnosis not present

## 2021-07-16 DIAGNOSIS — Z8572 Personal history of non-Hodgkin lymphomas: Secondary | ICD-10-CM | POA: Diagnosis not present

## 2021-07-16 DIAGNOSIS — F0390 Unspecified dementia without behavioral disturbance: Secondary | ICD-10-CM | POA: Diagnosis not present

## 2021-07-16 LAB — KAPPA/LAMBDA LIGHT CHAINS
Kappa free light chain: 22.5 mg/L — ABNORMAL HIGH (ref 3.3–19.4)
Kappa, lambda light chain ratio: 1.01 (ref 0.26–1.65)
Lambda free light chains: 22.2 mg/L (ref 5.7–26.3)

## 2021-07-16 MED ORDER — BORTEZOMIB CHEMO SQ INJECTION 3.5 MG (2.5MG/ML)
1.3000 mg/m2 | Freq: Once | INTRAMUSCULAR | Status: AC
  Administered 2021-07-16: 2 mg via SUBCUTANEOUS
  Filled 2021-07-16: qty 0.8

## 2021-07-16 MED ORDER — DEXAMETHASONE 4 MG PO TABS
10.0000 mg | ORAL_TABLET | Freq: Once | ORAL | Status: AC
  Administered 2021-07-16: 10 mg via ORAL
  Filled 2021-07-16: qty 3

## 2021-07-16 NOTE — Progress Notes (Signed)
Patient presents today for treatment per orders.  Patient tolerated treatment well with no complaints voiced.  Patient left via wheelchair in stable condition.  Vital signs stable at discharge.  Follow up as scheduled.    

## 2021-07-16 NOTE — Progress Notes (Addendum)
Location:  [facility] Room Number: 148-W Place of Service:  SNF (31)   CODE STATUS: DNR  Allergies  Allergen Reactions   Lotensin [Benazepril]     Chief Complaint  Patient presents with   Acute Visit    Right Knee Pain    HPI:  For the past several days she has had increased right knee pain; warmth; swelling present. There are no reports of fevers present. She is able to move her knee; but with some pain present. The staff is concerned about gout; her uric acid level is normal. Her cbc was normal as well.   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[place])    Diabetes mellitus without complication ([facility])    H/O partial nephrectomy    Hypokalemia    Hypothyroidism    Impaired cognition    Multiple myeloma (H[facility])    Dr Maylon Peppers, [facility]   Thyroid disease     Past Surgical History:  Procedure Laterality Date   ABDOMINAL HYSTERECTOMY     CRANIOTOMY     for lymphoma   YAG LASER APPLICATION Right 3/81/8299   Procedure: YAG LASER APPLICATION;  Surgeon: [name], MD;  Location: [facility];  Service: Ophthalmology;  Laterality: Right;    Social History   Socioeconomic History   Marital status: Single    Spouse name: Not on file   Number of children: Not on file   Years of education: Not on file   Highest education level: Not on file  Occupational History   Occupation: retired   Tobacco Use   Smoking status: Never   Smokeless tobacco: Never  Vaping Use   Vaping Use: Never used  Substance and Sexual Activity   Alcohol use: No   Drug use: No   Sexual activity: Never  Other Topics Concern   Not on file  Social History Narrative   Long term resident of [facility]    Social Determinants of Health   Financial Resource Strain: Low Risk    Difficulty of Paying Living Expenses: Not very hard  Food Insecurity: No Food Insecurity   Worried About Charity fundraiser in the Last Year:  Never true   [place] in the Last Year: Never true  Transportation Needs: No Transportation Needs   Lack of Transportation (Medical): No   Lack of Transportation (Non-Medical): No  Physical Activity: Inactive   Days of Exercise per Week: 0 days   Minutes of Exercise per Session: 0 min  Stress: No Stress Concern Present   Feeling of Stress : Not at all  Social Connections: Moderately Isolated   Frequency of Communication with Friends and Family: More than three times a week   Frequency of Social Gatherings with Friends and Family: Once a week   Attends Religious Services: More than 4 times per year   Active Member of [employer] or Organizations: No   Attends Archivist Meetings: Never   Marital Status: Never married  Human resources officer Violence: Not on file   Family History  Problem Relation Age of Onset   Depression Mother    Diabetes Mother    Heart disease Father    Cancer - Prostate Brother    Cancer Brother    Cancer Sister       VITAL SIGNS BP 112/60   Pulse 70   Temp 98.2 F (36.8 C)   Resp  20   Ht _0  (1.753 m)   Wt 120 lb (54.4 kg)   SpO2 97%   BMI 17.72 kg/m   Facility-Administered Encounter Medications as of 07/16/2021  Medication   prochlorperazine (COMPAZINE) tablet 10 mg   Outpatient Encounter Medications as of 07/16/2021  Medication Sig   acetaminophen (TYLENOL) 500 MG tablet Take 1,000 mg by mouth every 6 (six) hours as needed for mild pain.   acyclovir (ZOVIRAX) 400 MG tablet TAKE 1 TABLET BY MOUTH TWICE DAILY   aspirin EC 81 MG tablet Take 81 mg by mouth daily.   busPIRone (BUSPAR) 5 MG tablet Take 5 mg by mouth 2 (two) times daily. For anxiety   Calcium Carbonate-Vitamin D (CALCIUM-VITAMIN D3 PO) Take 400 Units by mouth daily in the afternoon.   cholestyramine (QUESTRAN) 4 g packet Take 4 g by mouth 2 (two) times daily between meals.   Lactobacillus Probiotic TABS Take 2 tablets by mouth daily.   levothyroxine (SYNTHROID, LEVOTHROID)  25 MCG tablet Take 25 mcg by mouth daily before breakfast.   lipase/protease/amylase (CREON) 36000 UNITS CPEP capsule Take 36,000 Units by mouth 2 (two) times daily. With snacks between meals at 10 am and 8 pm   lipase/protease/amylase (CREON) 36000 UNITS CPEP capsule Take 72,000 Units by mouth 3 (three) times daily with meals. 8 am, 12 pm, and 6 pm   loperamide (IMODIUM A-D) 2 MG tablet Take 4 mg by mouth 3 (three) times daily. For diarrhea   losartan (COZAAR) 50 MG tablet Take 1 tablet (50 mg total) by mouth daily.   Multiple Vitamins-Minerals (MULTIVITAMIN WOMEN 50+ PO) Take by mouth daily.    NON FORMULARY Regular diet no dairy products.   NON FORMULARY Wanderguard #2237 to ankle for safety awareness. Check placement and function qshift. Special Instructions: Check placement and function qshift. Every Shift Day, Evening, Night   potassium chloride SA (KLOR-CON) 20 MEQ tablet Take 20 mEq by mouth daily.      SIGNIFICANT DIAGNOSTIC EXAMS  PREVIOUS   08-10-20: t score: -3.758  12-21-20: KUBThere is a mild ileus noted. Mild to moderate increase feces in the colon. No renal stone is seen  12-25-20: KUB  There is a nonspecific bowel gas pattern without obstruction. No residual ileus is noted. Mild increased feces in the colon. No renal stone is seen.  02-05-21: ct of head:  1. No acute intracranial abnormality. Please note if concern for acute infarction MRI would be more sensitive. 2. Similar appearance of bifrontal encephalomalacia consistent with prior infarction/insult  02-05-21: MRI of brain:  No evidence of recent infarction, hemorrhage, or mass. Bifrontal encephalomalacia/gliosis. Additional chronic microvascular ischemic changes. Questioned small left anterior clinoid process calcified meningioma on prior CT imaging is not well evaluated due to size and lack of contrast.  TODAY  07-09-21 right knee x-ray:  1. No definite radiographic evidence of acute fracture or dislocation. If  there are persistent symptoms, followup x ray may be obtained as clinically warranted.  2. Mild joint effusion.  3. Mild osteopenia.  4. Mild degree of osteoarthritis.    LABS REVIEWED PREVIOUS    07-20-20: vit B 12: 620  07-23-20: tsh 2.264 07-24-20: wbc 3.9; hgb 9.3; hct 29.3 mcv 105.4 plt 105; glucose 106; bun 25; creat 1.28; k+ 3.6; na++ 142; a 9.2 liver normal albumin 3.5  07-30-20: chol 116; ldl 49; trig 71; hdl 53; hgb a1c 5.1; hep C neg  08-07-20: wbc 3.5; hgb 9.0; hct 27.9; mcv 104.5 plt 112; glucose 86;  bun 27; creat 1.22; k+ 4.2; an++ 143; ca 9.0 liver normal albumin 3.2 08-14-20: wbc 2.8; hgb 9.4; hct 29.2; mcv 104.3 plt 101; glucose 71; bun 27; creat 1.41; k+ 4.3; na++ 140; ca 9.0 liver normal albumin 3.1  08-16-20: urine micro-albumin 9.3 08-21-20: wbc 3.9; hgb 9.5; hct 29.7 mcv 104.2 plt 86; glucose 86; bun 18; creat 1.09; k+ 3.7; na++ 144; ca 8.1; liver normal albumin 3.1  09-18-20: wbc 4.4; hgb 9.6; hct 29.7 mcv 102.4 plt 105; glucose 99; bun 20; creat 1.12; k+ 3.2; na++ 142; ca 8.8 liver normal albumin 3.2  10-16-20: wbc 3.9; hgb 10.2; hct 32.3; mcv 104.9 plt 95; glucose 98; bun 19; creat 1.25; k+ 3.9; na++ 139; ca 8.8 liver normal albumin 3.2  12-04-20: wbc 3.1; hgb 10.9; hct 34.6; mcv 103.0 plt 130; glucose 132; bun 27; creat 1.35; k+ 3.6; na++ 143; ca 9.3 liver normal albumin 3.5  12-25-20: wbc 2.7; hgb 10.1; hct 31.2 mcv 103.0 plt 120; glucose 108; bun 13; creat 1.53; k+ 3.9; na++ 138; ca 8.7 liver normal albumin 3.2 GFR 3 01-01-21: wbc 2.5; hgb 10.6; hct 32.1; mcv 100.6 plt 87; glucose 87; bun 27; creat 1.24; k+ 3.8; na++ 144; ca 9.3 GFR 45; liver normal albumin 3.4  02-05-21: wbc 3.9; hgb 10.2; hct 31.6; mcv 102.9 plt 91; glucose 118; bun 26; creat 1.29; k+ 3.8; na++ 138; ca 8.7 GFR43 liver normal albumin 3.0 urine culture: e-coli 02-07-21: hgb a1c 5.3 04-08-21: chol 142; ldl 66; trig 101; hdl 56 04-09-21: wbc 4.6; hgb 11.3; hct 35.3; mcv 100.6 plt 140; glucose 101; bun 20; creat 1.13; k+  3.5; na++ 139; ca 8.5 GFR 50; liver normal albumin 3.4   TODAY  07-15-21: wbc 4.7; hgb 11.7; hct 35.9; mcv 97.0 plt 220; glucose 161; bun 21; creat 1.23; k+ 3.9; na++ 138; ca 8.8; GFR 46; liver normal albumin 3.3; uric acid 4.4   Review of Systems  Constitutional:  Negative for malaise/fatigue.  Respiratory:  Negative for cough and shortness of breath.   Cardiovascular:  Negative for chest pain, palpitations and leg swelling.  Gastrointestinal:  Negative for abdominal pain, constipation and heartburn.  Musculoskeletal:  Positive for joint pain. Negative for back pain and myalgias.       Right knee pain and swelling present   Skin: Negative.   Neurological:  Negative for dizziness.  Psychiatric/Behavioral:  The patient is not nervous/anxious.     Physical Exam Constitutional:      General: She is not in acute distress.    Appearance: She is cachectic. She is not diaphoretic.  Neck:     Thyroid: No thyromegaly.  Cardiovascular:     Rate and Rhythm: Normal rate and regular rhythm.     Heart sounds: Normal heart sounds.  Pulmonary:     Effort: Pulmonary effort is normal. No respiratory distress.     Breath sounds: Normal breath sounds.  Abdominal:     General: Bowel sounds are normal. There is no distension.     Palpations: Abdomen is soft.     Tenderness: There is no abdominal tenderness.  Musculoskeletal:        General: Swelling present. Normal range of motion.     Cervical back: Neck supple.     Right lower leg: No edema.     Left lower leg: No edema.     Comments: Right knee swelling; tenderness to palpation; redness present; warmth present  Lymphadenopathy:     Cervical: No cervical adenopathy.  Skin:    General: Skin is warm and dry.  Neurological:     Mental Status: She is alert. Mental status is at baseline.  Psychiatric:        Mood and Affect: Mood normal.      ASSESSMENT/ PLAN:  TODAY  Acute gout due to renal involvement of right knee CKD stage 3 due to  type 2 diabetes mellitus   Will begin colchicine 0.6 mg daily through 07-23-21 Will setup orthopedic consult for possible right knee tap of effusion   ADDENDUM:   Upon further reading: this could represent a septic joint. I have been unable to obtain a stat ct of her right knee; will send ot the ED.     Ok Edwards NP [facility]  Contact [phone] Monday through Friday 8am- 5pm  After hours call [phone]

## 2021-07-16 NOTE — Patient Instructions (Addendum)
[place] at [facility] Discharge Instructions  You were seen today by Dr. Delton Coombes. He went over your recent results, and you received your treatment. Dr. Delton Coombes will see you back in 2 months for labs and follow up.   Thank you for choosing [place] at [facility] to provide your oncology and hematology care.  To afford each patient quality time with our provider, please arrive at least 15 minutes before your scheduled appointment time.   If you have a lab appointment with the [facility] please come in thru the Main Entrance and check in at the main information desk  You need to re-schedule your appointment should you arrive 10 or more minutes late.  We strive to give you quality time with our providers, and arriving late affects you and other patients whose appointments are after yours.  Also, if you no show three or more times for appointments you may be dismissed from the clinic at the providers discretion.     Again, thank you for choosing [facility].  Our hope is that these requests will decrease the amount of time that you wait before being seen by our physicians.       _____________________________________________________________  Should you have questions after your visit to [facility], please contact our office at [phone] between the hours of 8:00 a.m. and 4:30 p.m.  Voicemails left after 4:00 p.m. will not be returned until the following business day.  For prescription refill requests, have your pharmacy contact our office and allow 72 hours.    Cancer Center Support Programs:   > Cancer Support Group  2nd Tuesday of the month 1pm-2pm, Journey Room

## 2021-07-16 NOTE — Patient Instructions (Signed)
[facility]  Discharge Instructions: Thank you for choosing [facility] to provide your oncology and hematology care.  If you have a lab appointment with the Cancer Center, please come in thru the Main Entrance and check in at the main information desk.  Wear comfortable clothing and clothing appropriate for easy access to any Portacath or PICC line.   We strive to give you quality time with your provider. You may need to reschedule your appointment if you arrive late (15 or more minutes).  Arriving late affects you and other patients whose appointments are after yours.  Also, if you miss three or more appointments without notifying the office, you may be dismissed from the clinic at the provider's discretion.      For prescription refill requests, have your pharmacy contact our office and allow 72 hours for refills to be completed.        To help prevent nausea and vomiting after your treatment, we encourage you to take your nausea medication as directed.  BELOW ARE SYMPTOMS THAT SHOULD BE REPORTED IMMEDIATELY: *FEVER GREATER THAN 100.4 F (38 C) OR HIGHER *CHILLS OR SWEATING *NAUSEA AND VOMITING THAT IS NOT CONTROLLED WITH YOUR NAUSEA MEDICATION *UNUSUAL SHORTNESS OF BREATH *UNUSUAL BRUISING OR BLEEDING *URINARY PROBLEMS (pain or burning when urinating, or frequent urination) *BOWEL PROBLEMS (unusual diarrhea, constipation, pain near the anus) TENDERNESS IN MOUTH AND THROAT WITH OR WITHOUT PRESENCE OF ULCERS (sore throat, sores in mouth, or a toothache) UNUSUAL RASH, SWELLING OR PAIN  UNUSUAL VAGINAL DISCHARGE OR ITCHING   Items with * indicate a potential emergency and should be followed up as soon as possible or go to the Emergency Department if any problems should occur.  Please show the CHEMOTHERAPY ALERT CARD or IMMUNOTHERAPY ALERT CARD at check-in to the Emergency Department and triage nurse.  Should you have questions after your visit or need to cancel  or reschedule your appointment, please contact [facility] [phone]  and follow the prompts.  Office hours are 8:00 a.m. to 4:30 p.m. Monday - Friday. Please note that voicemails left after 4:00 p.m. may not be returned until the following business day.  We are closed weekends and major holidays. You have access to a nurse at all times for urgent questions. Please call the main number to the clinic [phone] and follow the prompts.  For any non-urgent questions, you may also contact your provider using MyChart. We now offer e-Visits for anyone 18 and older to request care online for non-urgent symptoms. For details visit [email or web address].   Also download the MyChart app! Go to the app store, search "MyChart", open the app, select [facility], and log in with your MyChart username and password.  Due to Covid, a mask is required upon entering the hospital/clinic. If you do not have a mask, one will be given to you upon arrival. For doctor visits, patients may have 1 support person aged 18 or older with them. For treatment visits, patients cannot have anyone with them due to current Covid guidelines and our immunocompromised population.  

## 2021-07-16 NOTE — Progress Notes (Signed)
Labs reviewed with MD today at office visit, ok to proceed with treatment today per MD.

## 2021-07-16 NOTE — Progress Notes (Signed)
Patient has been assessed, vital signs and labs have been reviewed by Dr. Katragadda. ANC, Creatinine, LFTs, and Platelets are within treatment parameters per Dr. Katragadda. The patient is good to proceed with treatment at this time. Primary RN and pharmacy aware.  

## 2021-07-17 ENCOUNTER — Emergency Department (HOSPITAL_COMMUNITY)
Admission: EM | Admit: 2021-07-17 | Discharge: 2021-07-17 | Disposition: A | Discharge status: Home / Self Care | Payer: Medicare PPO | Attending: Emergency Medicine | Admitting: Emergency Medicine

## 2021-07-17 ENCOUNTER — Emergency Department (HOSPITAL_COMMUNITY): Payer: Medicare PPO

## 2021-07-17 ENCOUNTER — Encounter: Payer: Self-pay | Admitting: Adult Health

## 2021-07-17 ENCOUNTER — Inpatient Hospital Stay
Admission: RE | Admit: 2021-07-17 | Discharge: 2023-01-05 | Disposition: A | Discharge status: Skilled Nursing Facility | Payer: Medicare PPO | Source: Ambulatory Visit | Attending: Internal Medicine | Admitting: Internal Medicine

## 2021-07-17 ENCOUNTER — Other Ambulatory Visit: Payer: Self-pay

## 2021-07-17 ENCOUNTER — Encounter (HOSPITAL_COMMUNITY): Payer: Self-pay | Admitting: *Deleted

## 2021-07-17 DIAGNOSIS — C9000 Multiple myeloma not having achieved remission: Principal | ICD-10-CM

## 2021-07-17 DIAGNOSIS — Z79899 Other long term (current) drug therapy: Secondary | ICD-10-CM | POA: Insufficient documentation

## 2021-07-17 DIAGNOSIS — E039 Hypothyroidism, unspecified: Secondary | ICD-10-CM | POA: Diagnosis not present

## 2021-07-17 DIAGNOSIS — E1122 Type 2 diabetes mellitus with diabetic chronic kidney disease: Secondary | ICD-10-CM | POA: Insufficient documentation

## 2021-07-17 DIAGNOSIS — I129 Hypertensive chronic kidney disease with stage 1 through stage 4 chronic kidney disease, or unspecified chronic kidney disease: Secondary | ICD-10-CM | POA: Insufficient documentation

## 2021-07-17 DIAGNOSIS — F0390 Unspecified dementia without behavioral disturbance: Secondary | ICD-10-CM | POA: Diagnosis not present

## 2021-07-17 DIAGNOSIS — M25561 Pain in right knee: Secondary | ICD-10-CM | POA: Diagnosis not present

## 2021-07-17 DIAGNOSIS — Z7982 Long term (current) use of aspirin: Secondary | ICD-10-CM | POA: Insufficient documentation

## 2021-07-17 DIAGNOSIS — N183 Chronic kidney disease, stage 3 unspecified: Secondary | ICD-10-CM | POA: Diagnosis not present

## 2021-07-17 DIAGNOSIS — M7989 Other specified soft tissue disorders: Secondary | ICD-10-CM | POA: Diagnosis not present

## 2021-07-17 DIAGNOSIS — R55 Syncope and collapse: Secondary | ICD-10-CM | POA: Diagnosis not present

## 2021-07-17 DIAGNOSIS — Z9181 History of falling: Secondary | ICD-10-CM | POA: Diagnosis not present

## 2021-07-17 DIAGNOSIS — R41841 Cognitive communication deficit: Secondary | ICD-10-CM | POA: Diagnosis not present

## 2021-07-17 HISTORY — DX: Unspecified dementia, unspecified severity, without behavioral disturbance, psychotic disturbance, mood disturbance, and anxiety: F03.90

## 2021-07-17 LAB — PROTEIN ELECTROPHORESIS, SERUM
A/G Ratio: 1.2 (ref 0.7–1.7)
Albumin ELP: 3.3 g/dL (ref 2.9–4.4)
Alpha-1-Globulin: 0.2 g/dL (ref 0.0–0.4)
Alpha-2-Globulin: 0.8 g/dL (ref 0.4–1.0)
Beta Globulin: 0.8 g/dL (ref 0.7–1.3)
Gamma Globulin: 1 g/dL (ref 0.4–1.8)
Globulin, Total: 2.8 g/dL (ref 2.2–3.9)
M-Spike, %: 0.4 g/dL — ABNORMAL HIGH
Total Protein ELP: 6.1 g/dL (ref 6.0–8.5)

## 2021-07-17 NOTE — Discharge Instructions (Addendum)
You have some fluid of your right knee that may contain blood.  There was no evidence of a fracture on CT.  You will need to follow-up with orthopedics.  I have listed local provider for you.  Contact his office to arrange follow-up.  Wear the knee brace as needed for support

## 2021-07-17 NOTE — ED Triage Notes (Signed)
Pt with right knee pain and swelling since this morning.  Pt denies any injury to her knee.

## 2021-07-17 NOTE — Progress Notes (Signed)
She has a right knee which is swollen; red hot to touch; painful. The redness is worse today; I have been unable to get a ct scan of her before 08-05-21; will send her to the ED for further workup

## 2021-07-17 NOTE — ED Provider Notes (Signed)
[facility] Provider Note   CSN: [id number] Arrival date & time: 07/17/21  1203     History Chief Complaint  Patient presents with   Knee Pain    Sekai CALI HOPE is a 75 y.o. female.   Knee Pain Associated symptoms: no back pain, no fatigue, no fever and no neck pain       ARIONA DESCHENE is a 75 y.o. female seen here from [facility] for evaluation of right knee pain and swelling.  Symptoms were noticed by nursing home staff this morning.  Patient denies known injury.  Per caregiver at bedside, patient has dementia and wears an ankle monitor.  X-rays were performed this morning and patient sent here for further evaluation with CT of her knee with concern of gout vs septic joint.  Patient complains of pain with movement and upon standing.  Pain improves at rest.  She denies ankle or hip pain, fever and chills, redness or excessive warmth of the knee.    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[place])    Dementia ([place])    Diabetes mellitus without complication ([place])    H/O partial nephrectomy    Hypokalemia    Hypothyroidism    Impaired cognition    Multiple myeloma (HCC)    Dr Maylon Peppers, [facility]   Thyroid disease     Patient Active Problem List   Diagnosis Date Noted   Acute gout due to renal impairment involving right knee 07/16/2021   CKD stage 3 due to type 2 diabetes mellitus ([place]) 07/16/2021   Anxiety 05/07/2021   Aortic atherosclerosis ([place]) 01/07/2021   Pancreatic insufficiency 01/07/2021   Ileus ([place]) 01/02/2021   Bradycardia 12/11/2020   First degree AV block 12/11/2020   Diarrhea 09/10/2020   Post-menopausal osteoporosis 08/15/2020   Hypertension associated with type 2 diabetes mellitus ([place]) 07/27/2020   Vascular dementia with behavior disturbance ([place]) 07/27/2020   Herpes 07/27/2020   Hypokalemia 07/27/2020   Macrocytic anemia 07/19/2020   Adult failure to thrive  07/19/2020   Medicare annual wellness visit, subsequent 01/07/2020   UTI (urinary tract infection) 07/13/2017   Multiple myeloma ([place]) 07/09/2017   Benign hypertension 07/09/2017   Hypothyroidism 07/09/2017   Diabetes mellitus without complication ([place]) 62/95/2841   Lumbar compression fracture ([place]) 07/09/2017   Thrombocytopenia ([place]) 07/09/2017    Past Surgical History:  Procedure Laterality Date   ABDOMINAL HYSTERECTOMY     CRANIOTOMY     for lymphoma   YAG LASER APPLICATION Right 03/07/4009   Procedure: YAG LASER APPLICATION;  Surgeon: [name], MD;  Location: [facility];  Service: Ophthalmology;  Laterality: Right;     OB History   No obstetric history on file.     Family History  Problem Relation Age of Onset   Depression Mother    Diabetes Mother    Heart disease Father    Cancer - Prostate Brother    Cancer Brother    Cancer Sister     Social History   Tobacco Use   Smoking status: Never   Smokeless tobacco: Never  Vaping Use   Vaping Use: Never used  Substance Use Topics   Alcohol use: No   Drug use: No    Home Medications Prior to Admission medications   Medication Sig Start Date End Date Taking? Authorizing Provider  acetaminophen (TYLENOL) 500 MG tablet Take 1,000 mg by mouth every  6 (six) hours as needed for mild pain. Patient not taking: Reported on 07/16/2021    [provider]  acyclovir (ZOVIRAX) 400 MG tablet TAKE 1 TABLET BY MOUTH TWICE DAILY 07/18/20   Derek Jack, MD  aspirin EC 81 MG tablet Take 81 mg by mouth daily.    [provider]  busPIRone (BUSPAR) 5 MG tablet Take 5 mg by mouth 2 (two) times daily. For anxiety    [provider]  Calcium Carbonate-Vitamin D (CALCIUM-VITAMIN D3 PO) Take 400 Units by mouth daily in the afternoon. 07/17/20   [provider]  cholestyramine (QUESTRAN) 4 g packet Take 4 g by mouth 2 (two) times daily between meals. 12/21/20   [provider]  colchicine  0.6 MG tablet Take 0.6 mg by mouth daily. 07/16/21 07/23/21  [provider]  Lactobacillus Probiotic TABS Take 2 tablets by mouth daily. 09/12/20   [provider]  levothyroxine (SYNTHROID, LEVOTHROID) 25 MCG tablet Take 25 mcg by mouth daily before breakfast.    [provider]  lipase/protease/amylase (CREON) 36000 UNITS CPEP capsule Take 36,000 Units by mouth 2 (two) times daily. With snacks between meals at 10 am and 8 pm 12/27/20   [provider]  lipase/protease/amylase (CREON) 36000 UNITS CPEP capsule Take 72,000 Units by mouth 3 (three) times daily with meals. 8 am, 12 pm, and 6 pm    [provider]  loperamide (IMODIUM A-D) 2 MG tablet Take 4 mg by mouth 3 (three) times daily. For diarrhea 08/21/20   [provider]  losartan (COZAAR) 50 MG tablet Take 1 tablet (50 mg total) by mouth daily. 05/24/20   Derek Jack, MD  Multiple Vitamins-Minerals (MULTIVITAMIN WOMEN 50+ PO) Take by mouth daily.  12/26/10   [provider]  NON FORMULARY Regular diet no dairy products. 12/26/20   [provider]  NON FORMULARY Wanderguard [phone] to ankle for safety awareness. Check placement and function qshift. Special Instructions: Check placement and function qshift. Every Shift Day, Evening, Night 07/19/20   [provider]  potassium chloride SA (KLOR-CON) 20 MEQ tablet Take 20 mEq by mouth daily.  08/11/20   [provider]    Allergies    Lotensin [benazepril]  Review of Systems   Review of Systems  Constitutional:  Negative for chills, fatigue and fever.  Respiratory:  Negative for shortness of breath.   Cardiovascular:  Negative for chest pain.  Gastrointestinal:  Negative for nausea and vomiting.  Musculoskeletal:  Positive for arthralgias (right knee pain and swelling). Negative for back pain, myalgias, neck pain and neck stiffness.  Skin:  Negative for rash.  Neurological:  Negative for dizziness,  weakness and numbness.  Hematological:  Does not bruise/bleed easily.   Physical Exam Updated Vital Signs BP (!) 155/83 (BP Location: Right Arm)   Pulse 67   Temp 98.1 F (36.7 C) (Oral)   Resp 18   SpO2 98%   Physical Exam Vitals and nursing note reviewed.  Constitutional:      Appearance: Normal appearance. She is not ill-appearing or toxic-appearing.  HENT:     Head: Normocephalic.  Cardiovascular:     Rate and Rhythm: Normal rate and regular rhythm.     Pulses: Normal pulses.  Pulmonary:     Effort: Pulmonary effort is normal.     Breath sounds: Normal breath sounds. No wheezing.  Musculoskeletal:        General: Normal range of motion.     Cervical back: Normal  range of motion and neck supple.     Right lower leg: No edema.     Left lower leg: No edema.     Comments: Edema of the right anterior knee.  No erythema, edema or excessive warmth.  Pt able to ROM right knee without significant pain.   Skin:    General: Skin is warm.     Capillary Refill: Capillary refill takes less than 2 seconds.     Findings: No bruising, erythema or rash.  Neurological:     General: No focal deficit present.     Mental Status: She is alert.     Sensory: No sensory deficit.     Motor: No weakness.    ED Results / Procedures / Treatments   Labs (all labs ordered are listed, but only abnormal results are displayed) Labs Reviewed - No data to display  EKG None  Radiology CT Knee Right Wo Contrast  Result Date: 07/17/2021 CLINICAL DATA:  Right knee pain and swelling.  Denies any injury. EXAM: CT OF THE right KNEE WITHOUT CONTRAST TECHNIQUE: Multidetector CT imaging of the right knee was performed according to the standard protocol. Multiplanar CT image reconstructions were also generated. COMPARISON:  None. FINDINGS: Bones/Joint/Cartilage No evidence of acute fracture. Tricompartment degenerative change, worst in the medial and patellofemoral compartments. There is a large joint  effusion with heterogeneous density suggesting hemarthrosis. Ligaments Suboptimally assessed by CT. Muscles and Tendons No muscle atrophy. Soft tissues No additional findings. IMPRESSION: Large joint effusion with heterogeneous density suggesting hemarthrosis. No visible fracture. MRI may be useful for further evaluation. Tricompartment degenerative change, worst in the medial and patellofemoral compartments. Electronically Signed   By: Maurine Simmering   On: 07/17/2021 15:11     Procedures Procedures   Medications Ordered in ED Medications - No data to display  ED Course  I have reviewed the triage vital signs and the nursing notes.  Pertinent labs & imaging results that were available during my care of the patient were reviewed by me and considered in my medical decision making (see chart for details).    MDM Rules/Calculators/A&P                           Patient sent here from [facility] for evaluation of right knee pain and swelling with concern for septic joint vs gout.   On review of medical record, patient was seen by NP at [facility] he yesterday evaluated for right knee pain warmth and swelling.  Concern for gout.  Labs performed on 07/15/2021, that showed no leukocytosis, chemistries performed without significant change.  History of kidney disease creatinine at baseline.  On my exam, right knee is edematous but no erythema, excessive warmth and she is able to perform ROM of the knee.  I have low clinical suspicon of a septic joint and pt vitals are reassuring.  She was sent here for CT of the knee which I will order.  CT of the knee shows a large joint effusion which is likely related to hemarthrosis.  No visible fracture.  She does have some degenerative changes of the knee.  Patient also seen by Dr. Karle Starch care plan discussed. With CT suggestive of hemarthrosis without clinical evidence to suggest septic joint.  Do not feel that aspiration is needed at this time.  Patient will be  placed in a knee brace for support.  And given referral for local orthopedics.  She is appropriate  for discharge home.  Discussed care plan with the patient and caregiver at bedside.  Return precautions were discussed.   Final Clinical Impression(s) / ED Diagnoses Final diagnoses:  Acute pain of right knee    Rx / DC Orders ED Discharge Orders     None        Bufford Lope 07/21/21 2243    Truddie Hidden, MD 07/23/21 [phone]

## 2021-07-17 NOTE — ED Triage Notes (Signed)
Pt from [facility]

## 2021-07-19 ENCOUNTER — Inpatient Hospital Stay: Payer: Medicare PPO

## 2021-07-19 ENCOUNTER — Ambulatory Visit (INDEPENDENT_AMBULATORY_CARE_PROVIDER_SITE_OTHER): Payer: Medicare PPO | Admitting: Orthopedic Surgery

## 2021-07-19 ENCOUNTER — Other Ambulatory Visit (HOSPITAL_COMMUNITY): Payer: Self-pay

## 2021-07-19 ENCOUNTER — Encounter: Payer: Self-pay | Admitting: Orthopedic Surgery

## 2021-07-19 VITALS — BP 115/64 | HR 66

## 2021-07-19 DIAGNOSIS — M1711 Unilateral primary osteoarthritis, right knee: Secondary | ICD-10-CM | POA: Diagnosis not present

## 2021-07-19 DIAGNOSIS — M25561 Pain in right knee: Secondary | ICD-10-CM

## 2021-07-19 NOTE — Progress Notes (Signed)
New Patient Visit  Assessment: Amanda Wu is a 75 y.o. female with the following: Right knee pain; arthritis vs possible acute meniscus injury  Plan: Her right knee is not red, warm or painful with range of motion.  X-rays demonstrate some mild to moderate degenerative changes.  We discussed the possibility of an aspiration followed by steroid injection, and she is not interested at this time.  If she continues to have issues, she can return to clinic when ready to proceed with an injection and possible aspiration.  No follow-up needed.   Follow-up: Return if symptoms worsen or fail to improve.  Subjective:  Chief Complaint  Patient presents with   Knee Pain    Right knee pain, feeling pretty good today, NKI, effusion.     History of Present Illness: Amanda Wu is a 75 y.o. female who presents for evaluation of right knee pain.  She was seen in the emergency department a couple of days ago.  No known injury.  She notes a swelling to her right knee.  She does have a history of gout.  The pain has continued to improve since the onset.  She is taking Tylenol for pain.   Review of Systems: No fevers or chills No numbness or tingling No chest pain No shortness of breath No bowel or bladder dysfunction No GI distress No headaches   Medical History:  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HCC)    Dementia ([place])    Diabetes mellitus without complication ([place])    H/O partial nephrectomy    Hypokalemia    Hypothyroidism    Impaired cognition    Multiple myeloma ([place])    Dr Maylon Peppers, [facility]   Thyroid disease     Past Surgical History:  Procedure Laterality Date   ABDOMINAL HYSTERECTOMY     CRANIOTOMY     for lymphoma   YAG LASER APPLICATION Right 9/40/7680   Procedure: YAG LASER APPLICATION;  Surgeon: [name], MD;  Location: [facility];  Service: Ophthalmology;  Laterality: Right;     Family History  Problem Relation Age of Onset   Depression Mother    Diabetes Mother    Heart disease Father    Cancer - Prostate Brother    Cancer Brother    Cancer Sister    Social History   Tobacco Use   Smoking status: Never   Smokeless tobacco: Never  Vaping Use   Vaping Use: Never used  Substance Use Topics   Alcohol use: No   Drug use: No    Allergies  Allergen Reactions   Lotensin [Benazepril]     Current Meds  Medication Sig   acetaminophen (TYLENOL) 500 MG tablet Take 1,000 mg by mouth every 6 (six) hours as needed for mild pain.   acyclovir (ZOVIRAX) 400 MG tablet TAKE 1 TABLET BY MOUTH TWICE DAILY   aspirin EC 81 MG tablet Take 81 mg by mouth daily.   busPIRone (BUSPAR) 5 MG tablet Take 5 mg by mouth 2 (two) times daily. For anxiety   Calcium Carbonate-Vitamin D (CALCIUM-VITAMIN D3 PO) Take 400 Units by mouth daily in the afternoon.   colchicine 0.6 MG tablet Take 0.6 mg by mouth daily.   Lactobacillus Probiotic TABS Take 2 tablets by mouth daily.   levothyroxine (SYNTHROID, LEVOTHROID) 25 MCG tablet Take 25 mcg by mouth daily before breakfast.   lipase/protease/amylase (CREON) 36000 UNITS  CPEP capsule Take 36,000 Units by mouth 2 (two) times daily. With snacks between meals at 10 am and 8 pm   lipase/protease/amylase (CREON) 36000 UNITS CPEP capsule Take 72,000 Units by mouth 3 (three) times daily with meals. 8 am, 12 pm, and 6 pm   loperamide (IMODIUM A-D) 2 MG tablet Take 4 mg by mouth 3 (three) times daily. For diarrhea   losartan (COZAAR) 50 MG tablet Take 1 tablet (50 mg total) by mouth daily.   Multiple Vitamins-Minerals (MULTIVITAMIN WOMEN 50+ PO) Take by mouth daily.    potassium chloride SA (KLOR-CON) 20 MEQ tablet Take 20 mEq by mouth daily.     Objective: BP 115/64   Pulse 66   Physical Exam:  General: Elderly female. and Demented, does not answer questions appropriately.  Gait: Unable to ambulate.  Evaluation the right knee  demonstrates a moderate effusion.  No redness.  No warmth is appreciated.  She tolerates full range of motion.  Tenderness palpation over the medial and lateral joint lines.  Negative Lachman.  No increased laxity varus or valgus stress.  Sensation is intact distally.   IMAGING: I personally ordered and reviewed the following images  X-rays of the right knee were obtained in clinic today and demonstrates some mild loss of joint space medial and lateral.  There are some small osteophytes in all 3 compartments.  Impression: Right knee with moderate arthritis   New Medications:  No orders of the defined types were placed in this encounter.     Mordecai Rasmussen, MD  07/19/2021 10:49 PM

## 2021-07-22 ENCOUNTER — Telehealth: Payer: Self-pay | Admitting: Orthopedic Surgery

## 2021-07-22 ENCOUNTER — Other Ambulatory Visit: Payer: Self-pay

## 2021-07-22 ENCOUNTER — Inpatient Hospital Stay (HOSPITAL_COMMUNITY): Payer: Medicare PPO

## 2021-07-22 DIAGNOSIS — R55 Syncope and collapse: Secondary | ICD-10-CM | POA: Diagnosis not present

## 2021-07-22 DIAGNOSIS — C9000 Multiple myeloma not having achieved remission: Secondary | ICD-10-CM | POA: Diagnosis not present

## 2021-07-22 DIAGNOSIS — D631 Anemia in chronic kidney disease: Secondary | ICD-10-CM | POA: Diagnosis not present

## 2021-07-22 DIAGNOSIS — I129 Hypertensive chronic kidney disease with stage 1 through stage 4 chronic kidney disease, or unspecified chronic kidney disease: Secondary | ICD-10-CM | POA: Diagnosis not present

## 2021-07-22 DIAGNOSIS — Z9181 History of falling: Secondary | ICD-10-CM | POA: Diagnosis not present

## 2021-07-22 DIAGNOSIS — N189 Chronic kidney disease, unspecified: Secondary | ICD-10-CM | POA: Diagnosis not present

## 2021-07-22 DIAGNOSIS — Z8572 Personal history of non-Hodgkin lymphomas: Secondary | ICD-10-CM | POA: Diagnosis not present

## 2021-07-22 DIAGNOSIS — F0390 Unspecified dementia without behavioral disturbance: Secondary | ICD-10-CM | POA: Diagnosis not present

## 2021-07-22 DIAGNOSIS — Z5111 Encounter for antineoplastic chemotherapy: Secondary | ICD-10-CM | POA: Diagnosis not present

## 2021-07-22 DIAGNOSIS — R41841 Cognitive communication deficit: Secondary | ICD-10-CM | POA: Diagnosis not present

## 2021-07-22 DIAGNOSIS — E039 Hypothyroidism, unspecified: Secondary | ICD-10-CM | POA: Diagnosis not present

## 2021-07-22 DIAGNOSIS — E119 Type 2 diabetes mellitus without complications: Secondary | ICD-10-CM | POA: Diagnosis not present

## 2021-07-22 LAB — CBC WITH DIFFERENTIAL/PLATELET
Abs Immature Granulocytes: 0.01 10*3/uL (ref 0.00–0.07)
Basophils Absolute: 0 10*3/uL (ref 0.0–0.1)
Basophils Relative: 0 %
Eosinophils Absolute: 0.1 10*3/uL (ref 0.0–0.5)
Eosinophils Relative: 2 %
HCT: 33.8 % — ABNORMAL LOW (ref 36.0–46.0)
Hemoglobin: 10.6 g/dL — ABNORMAL LOW (ref 12.0–15.0)
Immature Granulocytes: 0 %
Lymphocytes Relative: 13 %
Lymphs Abs: 0.6 10*3/uL — ABNORMAL LOW (ref 0.7–4.0)
MCH: 30.5 pg (ref 26.0–34.0)
MCHC: 31.4 g/dL (ref 30.0–36.0)
MCV: 97.1 fL (ref 80.0–100.0)
Monocytes Absolute: 0.4 10*3/uL (ref 0.1–1.0)
Monocytes Relative: 9 %
Neutro Abs: 3.5 10*3/uL (ref 1.7–7.7)
Neutrophils Relative %: 76 %
Platelets: 115 10*3/uL — ABNORMAL LOW (ref 150–400)
RBC: 3.48 MIL/uL — ABNORMAL LOW (ref 3.87–5.11)
RDW: 16.4 % — ABNORMAL HIGH (ref 11.5–15.5)
WBC: 4.6 10*3/uL (ref 4.0–10.5)
nRBC: 0 % (ref 0.0–0.2)

## 2021-07-22 LAB — COMPREHENSIVE METABOLIC PANEL
ALT: 22 U/L (ref 0–44)
AST: 32 U/L (ref 15–41)
Albumin: 3.2 g/dL — ABNORMAL LOW (ref 3.5–5.0)
Alkaline Phosphatase: 66 U/L (ref 38–126)
Anion gap: 7 (ref 5–15)
BUN: 22 mg/dL (ref 8–23)
CO2: 25 mmol/L (ref 22–32)
Calcium: 8.8 mg/dL — ABNORMAL LOW (ref 8.9–10.3)
Chloride: 108 mmol/L (ref 98–111)
Creatinine, Ser: 1.06 mg/dL — ABNORMAL HIGH (ref 0.44–1.00)
GFR, Estimated: 54 mL/min — ABNORMAL LOW (ref >60)
Glucose, Bld: 142 mg/dL — ABNORMAL HIGH (ref 70–99)
Potassium: 3.5 mmol/L (ref 3.5–5.1)
Sodium: 140 mmol/L (ref 135–145)
Total Bilirubin: 0.6 mg/dL (ref 0.3–1.2)
Total Protein: 6.2 g/dL — ABNORMAL LOW (ref 6.5–8.1)

## 2021-07-22 LAB — LACTATE DEHYDROGENASE: LDH: 193 U/L — ABNORMAL HIGH (ref 98–192)

## 2021-07-22 NOTE — Telephone Encounter (Signed)
Received voice message of Friday 07/19/21, after hours from [facility], when office re-opened today, 07/22/21. Message was from [place] Ferdinand Lango, ph [phone], at facility, requesting that patient be seen again for aspiration of knee. Patient had been seen on that day by Dr Amedeo Kinsman. I have called back to ph# as noted, as well as main numbers [phone], option 2, [place], and no answer at any number. Also chose option 9 for receptionist, to follow up; connected by receptionist, and no answer or voice message as in previous attempts. Sent a fax requesting for facility to call us.

## 2021-07-22 NOTE — Telephone Encounter (Signed)
Received call back from [place] at [facility], [place], [phone] - relays that facility provider/P.A would like patient to return to see Dr Amedeo Kinsman for aspiration of same knee. Relayed Dr Amedeo Kinsman is out of clinic for a week, and scheduled for first available. Aware of the appointment 08/02/21, 9:40am; aware I will also send message to clinic staff.

## 2021-07-23 ENCOUNTER — Inpatient Hospital Stay (HOSPITAL_COMMUNITY): Payer: Medicare PPO

## 2021-07-23 VITALS — BP 131/77 | HR 89 | Temp 97.0°F | Resp 18 | Wt 116.4 lb

## 2021-07-23 DIAGNOSIS — E119 Type 2 diabetes mellitus without complications: Secondary | ICD-10-CM | POA: Diagnosis not present

## 2021-07-23 DIAGNOSIS — F0390 Unspecified dementia without behavioral disturbance: Secondary | ICD-10-CM | POA: Diagnosis not present

## 2021-07-23 DIAGNOSIS — C9000 Multiple myeloma not having achieved remission: Secondary | ICD-10-CM | POA: Diagnosis not present

## 2021-07-23 DIAGNOSIS — N189 Chronic kidney disease, unspecified: Secondary | ICD-10-CM | POA: Diagnosis not present

## 2021-07-23 DIAGNOSIS — I129 Hypertensive chronic kidney disease with stage 1 through stage 4 chronic kidney disease, or unspecified chronic kidney disease: Secondary | ICD-10-CM | POA: Diagnosis not present

## 2021-07-23 DIAGNOSIS — Z8572 Personal history of non-Hodgkin lymphomas: Secondary | ICD-10-CM | POA: Diagnosis not present

## 2021-07-23 DIAGNOSIS — D631 Anemia in chronic kidney disease: Secondary | ICD-10-CM | POA: Diagnosis not present

## 2021-07-23 DIAGNOSIS — E039 Hypothyroidism, unspecified: Secondary | ICD-10-CM | POA: Diagnosis not present

## 2021-07-23 DIAGNOSIS — Z5111 Encounter for antineoplastic chemotherapy: Secondary | ICD-10-CM | POA: Diagnosis not present

## 2021-07-23 MED ORDER — PEGFILGRASTIM-CBQV 6 MG/0.6ML ~~LOC~~ SOSY
PREFILLED_SYRINGE | SUBCUTANEOUS | Status: AC
  Filled 2021-07-23: qty 0.6

## 2021-07-23 MED ORDER — BORTEZOMIB CHEMO SQ INJECTION 3.5 MG (2.5MG/ML)
1.3000 mg/m2 | Freq: Once | INTRAMUSCULAR | Status: AC
  Administered 2021-07-23: 2 mg via SUBCUTANEOUS
  Filled 2021-07-23: qty 0.8

## 2021-07-23 MED ORDER — DEXAMETHASONE 4 MG PO TABS
10.0000 mg | ORAL_TABLET | Freq: Once | ORAL | Status: AC
  Administered 2021-07-23: 10 mg via ORAL

## 2021-07-23 MED ORDER — DEXAMETHASONE 4 MG PO TABS
ORAL_TABLET | ORAL | Status: AC
  Filled 2021-07-23: qty 3

## 2021-07-23 NOTE — Progress Notes (Signed)
Patient presents today for Velcade injection.  Labs on 07/22/21 within parameters for treatment.  Vital signs within parameters for treatment.  Patient has no new complaints at this time.  Velcade administration without incident; injection site WNL; see MAR for injection details.  Patient tolerated procedure well and without incident.  No questions or complaints noted at this time.  Discharge from clinic via wheelchair in stable condition.  Alert and oriented X 3.  Follow up with [facility] as scheduled.

## 2021-07-23 NOTE — Patient Instructions (Signed)
[place]  Discharge Instructions: Thank you for choosing [facility] to provide your oncology and hematology care.  If you have a lab appointment with the [facility], please come in thru the Main Entrance and check in at the main information desk.  Wear comfortable clothing and clothing appropriate for easy access to any Portacath or PICC line.   We strive to give you quality time with your provider. You may need to reschedule your appointment if you arrive late (15 or more minutes).  Arriving late affects you and other patients whose appointments are after yours.  Also, if you miss three or more appointments without notifying the office, you may be dismissed from the clinic at the provider's discretion.      For prescription refill requests, have your pharmacy contact our office and allow 72 hours for refills to be completed.    Today you received the following chemotherapy and/or immunotherapy agents Velcade      To help prevent nausea and vomiting after your treatment, we encourage you to take your nausea medication as directed.  BELOW ARE SYMPTOMS THAT SHOULD BE REPORTED IMMEDIATELY: *FEVER GREATER THAN 100.4 F (38 C) OR HIGHER *CHILLS OR SWEATING *NAUSEA AND VOMITING THAT IS NOT CONTROLLED WITH YOUR NAUSEA MEDICATION *UNUSUAL SHORTNESS OF BREATH *UNUSUAL BRUISING OR BLEEDING *URINARY PROBLEMS (pain or burning when urinating, or frequent urination) *BOWEL PROBLEMS (unusual diarrhea, constipation, pain near the anus) TENDERNESS IN MOUTH AND THROAT WITH OR WITHOUT PRESENCE OF ULCERS (sore throat, sores in mouth, or a toothache) UNUSUAL RASH, SWELLING OR PAIN  UNUSUAL VAGINAL DISCHARGE OR ITCHING   Items with * indicate a potential emergency and should be followed up as soon as possible or go to the Emergency Department if any problems should occur.  Please show the CHEMOTHERAPY ALERT CARD or IMMUNOTHERAPY ALERT CARD at check-in to the Emergency  Department and triage nurse.  Should you have questions after your visit or need to cancel or reschedule your appointment, please contact [facility] [phone]  and follow the prompts.  Office hours are 8:00 a.m. to 4:30 p.m. Monday - Friday. Please note that voicemails left after 4:00 p.m. may not be returned until the following business day.  We are closed weekends and major holidays. You have access to a nurse at all times for urgent questions. Please call the main number to the clinic [phone] and follow the prompts.  For any non-urgent questions, you may also contact your provider using MyChart. We now offer e-Visits for anyone 57 and older to request care online for non-urgent symptoms. For details visit [email or web address].   Also download the MyChart app! Go to the app store, search "MyChart", open the app, select [facility], and log in with your MyChart username and password.  Due to Covid, a mask is required upon entering the hospital/clinic. If you do not have a mask, one will be given to you upon arrival. For doctor visits, patients may have 1 support person aged 72 or older with them. For treatment visits, patients cannot have anyone with them due to current Covid guidelines and our immunocompromised population.

## 2021-07-24 DIAGNOSIS — R41841 Cognitive communication deficit: Secondary | ICD-10-CM | POA: Diagnosis not present

## 2021-07-24 DIAGNOSIS — F0390 Unspecified dementia without behavioral disturbance: Secondary | ICD-10-CM | POA: Diagnosis not present

## 2021-07-24 DIAGNOSIS — R55 Syncope and collapse: Secondary | ICD-10-CM | POA: Diagnosis not present

## 2021-07-24 DIAGNOSIS — Z9181 History of falling: Secondary | ICD-10-CM | POA: Diagnosis not present

## 2021-07-25 DIAGNOSIS — R55 Syncope and collapse: Secondary | ICD-10-CM | POA: Diagnosis not present

## 2021-07-25 DIAGNOSIS — R41841 Cognitive communication deficit: Secondary | ICD-10-CM | POA: Diagnosis not present

## 2021-07-25 DIAGNOSIS — Z9181 History of falling: Secondary | ICD-10-CM | POA: Diagnosis not present

## 2021-07-25 DIAGNOSIS — F0390 Unspecified dementia without behavioral disturbance: Secondary | ICD-10-CM | POA: Diagnosis not present

## 2021-07-29 ENCOUNTER — Inpatient Hospital Stay (HOSPITAL_COMMUNITY): Payer: Medicare PPO

## 2021-07-29 DIAGNOSIS — E039 Hypothyroidism, unspecified: Secondary | ICD-10-CM | POA: Diagnosis not present

## 2021-07-29 DIAGNOSIS — F0390 Unspecified dementia without behavioral disturbance: Secondary | ICD-10-CM | POA: Diagnosis not present

## 2021-07-29 DIAGNOSIS — Z5111 Encounter for antineoplastic chemotherapy: Secondary | ICD-10-CM | POA: Diagnosis not present

## 2021-07-29 DIAGNOSIS — I129 Hypertensive chronic kidney disease with stage 1 through stage 4 chronic kidney disease, or unspecified chronic kidney disease: Secondary | ICD-10-CM | POA: Diagnosis not present

## 2021-07-29 DIAGNOSIS — C9000 Multiple myeloma not having achieved remission: Secondary | ICD-10-CM | POA: Diagnosis not present

## 2021-07-29 DIAGNOSIS — Z8572 Personal history of non-Hodgkin lymphomas: Secondary | ICD-10-CM | POA: Diagnosis not present

## 2021-07-29 DIAGNOSIS — D631 Anemia in chronic kidney disease: Secondary | ICD-10-CM | POA: Diagnosis not present

## 2021-07-29 DIAGNOSIS — N189 Chronic kidney disease, unspecified: Secondary | ICD-10-CM | POA: Diagnosis not present

## 2021-07-29 DIAGNOSIS — E119 Type 2 diabetes mellitus without complications: Secondary | ICD-10-CM | POA: Diagnosis not present

## 2021-07-29 LAB — CBC WITH DIFFERENTIAL/PLATELET
Abs Immature Granulocytes: 0.02 10*3/uL (ref 0.00–0.07)
Basophils Absolute: 0 10*3/uL (ref 0.0–0.1)
Basophils Relative: 0 %
Eosinophils Absolute: 0.1 10*3/uL (ref 0.0–0.5)
Eosinophils Relative: 2 %
HCT: 35 % — ABNORMAL LOW (ref 36.0–46.0)
Hemoglobin: 11.2 g/dL — ABNORMAL LOW (ref 12.0–15.0)
Immature Granulocytes: 0 %
Lymphocytes Relative: 11 %
Lymphs Abs: 0.6 10*3/uL — ABNORMAL LOW (ref 0.7–4.0)
MCH: 31.5 pg (ref 26.0–34.0)
MCHC: 32 g/dL (ref 30.0–36.0)
MCV: 98.3 fL (ref 80.0–100.0)
Monocytes Absolute: 0.6 10*3/uL (ref 0.1–1.0)
Monocytes Relative: 10 %
Neutro Abs: 4.6 10*3/uL (ref 1.7–7.7)
Neutrophils Relative %: 77 %
Platelets: 101 10*3/uL — ABNORMAL LOW (ref 150–400)
RBC: 3.56 MIL/uL — ABNORMAL LOW (ref 3.87–5.11)
RDW: 17 % — ABNORMAL HIGH (ref 11.5–15.5)
WBC: 5.9 10*3/uL (ref 4.0–10.5)
nRBC: 0 % (ref 0.0–0.2)

## 2021-07-29 LAB — COMPREHENSIVE METABOLIC PANEL
ALT: 22 U/L (ref 0–44)
AST: 39 U/L (ref 15–41)
Albumin: 3.3 g/dL — ABNORMAL LOW (ref 3.5–5.0)
Alkaline Phosphatase: 67 U/L (ref 38–126)
Anion gap: 5 (ref 5–15)
BUN: 22 mg/dL (ref 8–23)
CO2: 24 mmol/L (ref 22–32)
Calcium: 8.7 mg/dL — ABNORMAL LOW (ref 8.9–10.3)
Chloride: 108 mmol/L (ref 98–111)
Creatinine, Ser: 1.26 mg/dL — ABNORMAL HIGH (ref 0.44–1.00)
GFR, Estimated: 44 mL/min — ABNORMAL LOW (ref >60)
Glucose, Bld: 110 mg/dL — ABNORMAL HIGH (ref 70–99)
Potassium: 3.8 mmol/L (ref 3.5–5.1)
Sodium: 137 mmol/L (ref 135–145)
Total Bilirubin: 0.5 mg/dL (ref 0.3–1.2)
Total Protein: 6.4 g/dL — ABNORMAL LOW (ref 6.5–8.1)

## 2021-07-30 ENCOUNTER — Inpatient Hospital Stay (HOSPITAL_COMMUNITY): Payer: Medicare PPO

## 2021-07-30 VITALS — BP 126/69 | HR 86 | Temp 96.8°F | Resp 17 | Wt 114.4 lb

## 2021-07-30 DIAGNOSIS — I129 Hypertensive chronic kidney disease with stage 1 through stage 4 chronic kidney disease, or unspecified chronic kidney disease: Secondary | ICD-10-CM | POA: Diagnosis not present

## 2021-07-30 DIAGNOSIS — Z8572 Personal history of non-Hodgkin lymphomas: Secondary | ICD-10-CM | POA: Diagnosis not present

## 2021-07-30 DIAGNOSIS — C9000 Multiple myeloma not having achieved remission: Secondary | ICD-10-CM | POA: Diagnosis not present

## 2021-07-30 DIAGNOSIS — Z5111 Encounter for antineoplastic chemotherapy: Secondary | ICD-10-CM | POA: Diagnosis not present

## 2021-07-30 DIAGNOSIS — N189 Chronic kidney disease, unspecified: Secondary | ICD-10-CM | POA: Diagnosis not present

## 2021-07-30 DIAGNOSIS — E039 Hypothyroidism, unspecified: Secondary | ICD-10-CM | POA: Diagnosis not present

## 2021-07-30 DIAGNOSIS — D631 Anemia in chronic kidney disease: Secondary | ICD-10-CM | POA: Diagnosis not present

## 2021-07-30 DIAGNOSIS — E119 Type 2 diabetes mellitus without complications: Secondary | ICD-10-CM | POA: Diagnosis not present

## 2021-07-30 DIAGNOSIS — F0390 Unspecified dementia without behavioral disturbance: Secondary | ICD-10-CM | POA: Diagnosis not present

## 2021-07-30 MED ORDER — BORTEZOMIB CHEMO SQ INJECTION 3.5 MG (2.5MG/ML)
1.3000 mg/m2 | Freq: Once | INTRAMUSCULAR | Status: AC
  Administered 2021-07-30: 2 mg via SUBCUTANEOUS
  Filled 2021-07-30: qty 0.8

## 2021-07-30 MED ORDER — DEXAMETHASONE 4 MG PO TABS
10.0000 mg | ORAL_TABLET | Freq: Once | ORAL | Status: AC
  Administered 2021-07-30: 10 mg via ORAL
  Filled 2021-07-30: qty 3

## 2021-07-30 NOTE — Progress Notes (Signed)
Pt here today for Velcade injection per provider's order. Vital signs stable labs within parameters for treatment and pt voiced no new complaints at this time.  Velcade injection given today per MD orders. Tolerated infusion without adverse affects. Vital signs stable. No complaints at this time. Discharged from clinic via wheelchair in stable condition. Alert and oriented x 3. F/U with [facility] as scheduled.

## 2021-07-30 NOTE — Patient Instructions (Signed)
[place]  Discharge Instructions: Thank you for choosing [facility] to provide your oncology and hematology care.  If you have a lab appointment with the [facility], please come in thru the Main Entrance and check in at the main information desk.  Wear comfortable clothing and clothing appropriate for easy access to any Portacath or PICC line.   We strive to give you quality time with your provider. You may need to reschedule your appointment if you arrive late (15 or more minutes).  Arriving late affects you and other patients whose appointments are after yours.  Also, if you miss three or more appointments without notifying the office, you may be dismissed from the clinic at the provider's discretion.      For prescription refill requests, have your pharmacy contact our office and allow 72 hours for refills to be completed.    Today you received the following chemotherapy and/or immunotherapy agents Velcade injection.   To help prevent nausea and vomiting after your treatment, we encourage you to take your nausea medication as directed.  BELOW ARE SYMPTOMS THAT SHOULD BE REPORTED IMMEDIATELY: *FEVER GREATER THAN 100.4 F (38 C) OR HIGHER *CHILLS OR SWEATING *NAUSEA AND VOMITING THAT IS NOT CONTROLLED WITH YOUR NAUSEA MEDICATION *UNUSUAL SHORTNESS OF BREATH *UNUSUAL BRUISING OR BLEEDING *URINARY PROBLEMS (pain or burning when urinating, or frequent urination) *BOWEL PROBLEMS (unusual diarrhea, constipation, pain near the anus) TENDERNESS IN MOUTH AND THROAT WITH OR WITHOUT PRESENCE OF ULCERS (sore throat, sores in mouth, or a toothache) UNUSUAL RASH, SWELLING OR PAIN  UNUSUAL VAGINAL DISCHARGE OR ITCHING   Items with * indicate a potential emergency and should be followed up as soon as possible or go to the Emergency Department if any problems should occur.  Please show the CHEMOTHERAPY ALERT CARD or IMMUNOTHERAPY ALERT CARD at check-in to the Emergency  Department and triage nurse.  Should you have questions after your visit or need to cancel or reschedule your appointment, please contact [facility] [phone]  and follow the prompts.  Office hours are 8:00 a.m. to 4:30 p.m. Monday - Friday. Please note that voicemails left after 4:00 p.m. may not be returned until the following business day.  We are closed weekends and major holidays. You have access to a nurse at all times for urgent questions. Please call the main number to the clinic [phone] and follow the prompts.  For any non-urgent questions, you may also contact your provider using MyChart. We now offer e-Visits for anyone 60 and older to request care online for non-urgent symptoms. For details visit [email or web address].   Also download the MyChart app! Go to the app store, search "MyChart", open the app, select [facility], and log in with your MyChart username and password.  Due to Covid, a mask is required upon entering the hospital/clinic. If you do not have a mask, one will be given to you upon arrival. For doctor visits, patients may have 1 support person aged 3 or older with them. For treatment visits, patients cannot have anyone with them due to current Covid guidelines and our immunocompromised population.

## 2021-07-31 DIAGNOSIS — Z23 Encounter for immunization: Secondary | ICD-10-CM | POA: Diagnosis not present

## 2021-08-02 ENCOUNTER — Ambulatory Visit (INDEPENDENT_AMBULATORY_CARE_PROVIDER_SITE_OTHER): Payer: Medicare PPO | Admitting: Orthopedic Surgery

## 2021-08-02 ENCOUNTER — Encounter: Payer: Self-pay | Admitting: Orthopedic Surgery

## 2021-08-02 VITALS — BP 135/77 | HR 55

## 2021-08-02 DIAGNOSIS — M1711 Unilateral primary osteoarthritis, right knee: Secondary | ICD-10-CM

## 2021-08-02 NOTE — Patient Instructions (Signed)

## 2021-08-02 NOTE — Progress Notes (Signed)
Orthopaedic Clinic Return  Assessment: Amanda Wu is a 75 y.o. female with the following: Right knee pain; mild to moderate knee arthritis  Plan: We discussed the radiographic findings of her right knee pain.  She continues to have effusion, difficulty ambulating.  No obvious fractures.  She does have some mild to moderate arthritis, with evidence of osteophytes.  She is interested in pursuing a aspiration, as well as a steroid injection.  I think this is reasonable.  We will proceed today.  Follow-up as needed.  Procedure note: aspiration and injection Right knee joint   Verbal consent was obtained to aspirate and inject the right knee joint  Timeout was completed to confirm the site of injection.  The skin was prepped with alcohol and ethyl chloride was sprayed at the injection site.  An 18-gauge was used, anterolateral approach.  Approximately 5 cc of bloody joint fluid was aspirated.  Using the same needle, we injected 40 mg of Depo-Medrol and 1% lidocaine (3 cc) into the right knee using an anterolateral approach.  There were no complications. A sterile bandage was applied.  Follow-up: Return if symptoms worsen or fail to improve.   Subjective:  Chief Complaint  Patient presents with   Knee Pain    Right knee/[facility] requesting aspiration of knee    History of Present Illness: Amanda Wu is a 75 y.o. female who returns to clinic for repeat evaluation of her right knee.  She sustained a fall couple weeks ago.  She was evaluated in the emergency department.  X-rays and a CT scan at that time were negative for fracture.  She continues to have pain and swelling of the right knee.  When I saw her, we discussed possibility of proceeding with an aspiration and injection.  After further discussion with family, she is returned, and has elected to proceed with this procedure.  She is continues have difficulty with ambulation due to pain.  She is not taking anything other than  Tylenol for pain.  Of note, she does have some dementia, and easily forgets.  She lives in an assisted living facility.  Review of Systems: No fevers or chills No numbness or tingling No chest pain No shortness of breath No bowel or bladder dysfunction No GI distress No headaches   Objective: BP 135/77   Pulse (!) 55   Physical Exam:  Elderly female.  Seated in wheelchair.  Evaluation of right knee demonstrates a mild effusion.  She does have some obvious swelling over the anterior lateral knee.  This is tender to palpation.  No bruising.  Tenderness palpation along the medial joint line.  She tolerates passive range of motion to full extension, flexion beyond 100 degrees.  IMAGING: I personally ordered and reviewed the following images:  No new imaging obtained today.  Mordecai Rasmussen, MD 08/02/2021 1:26 PM

## 2021-08-05 ENCOUNTER — Ambulatory Visit (HOSPITAL_COMMUNITY): Admit: 2021-08-05 | Payer: Medicare PPO

## 2021-08-06 ENCOUNTER — Non-Acute Institutional Stay (SKILLED_NURSING_FACILITY): Payer: Medicare PPO | Admitting: Internal Medicine

## 2021-08-06 ENCOUNTER — Encounter: Payer: Self-pay | Admitting: Internal Medicine

## 2021-08-06 DIAGNOSIS — D696 Thrombocytopenia, unspecified: Secondary | ICD-10-CM | POA: Diagnosis not present

## 2021-08-06 DIAGNOSIS — E1122 Type 2 diabetes mellitus with diabetic chronic kidney disease: Secondary | ICD-10-CM

## 2021-08-06 DIAGNOSIS — I10 Essential (primary) hypertension: Secondary | ICD-10-CM

## 2021-08-06 DIAGNOSIS — N183 Chronic kidney disease, stage 3 unspecified: Secondary | ICD-10-CM | POA: Diagnosis not present

## 2021-08-06 DIAGNOSIS — D539 Nutritional anemia, unspecified: Secondary | ICD-10-CM | POA: Diagnosis not present

## 2021-08-06 DIAGNOSIS — E119 Type 2 diabetes mellitus without complications: Secondary | ICD-10-CM | POA: Diagnosis not present

## 2021-08-06 NOTE — Assessment & Plan Note (Addendum)
Current H/H 11.2/ 35; prior H/H 10.6/33.8 Indices normocytic Anemia improved  ROS positive for intermittent hematuria but in context of dementia  No bleeding dyscrasias reported by Staff Monitor CBC

## 2021-08-06 NOTE — Progress Notes (Signed)
   NURSING HOME LOCATION:  [facility] ROOM NUMBER:  148 W  CODE STATUS:  DNR  PCP:  Ok Edwards NP  This is a nursing facility follow up of chronic medical diagnoses & to document compliance with Regulation 483.30 (c) in The [place] Manual Phase 2 which mandates caregiver visit ( visits can alternate among physician, PA or NP as per statutes) within 10 days of 30 days / 60 days/ 90 days post admission to SNF date    Interim medical record and care since last SNF visit was updated with review of diagnostic studies and change in clinical status since last visit were documented.  HPI: She is a permanent resident of this facility with medical diagnoses of essential hypertension, history of central nervous system lymphoma, dementia, hypothyroidism, multiple myeloma, and history of partial nephrectomy.  Review of systems: Dementia invalidated responses.  She has known dementia but does answer questions.  She did exhibit some confabulation.  She told me she graduated (from [employer]) in 1963 but then subsequently stated it was 41.  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  She began to talk about her 2 sisters and named 3 people.  She was also discussing "one lady here who speaks to me, but I cannot remember her name".  She describes occasional hematuria.  She also describes occasional diarrhea which she relates to the intake of green vegetables.  Otherwise review of systems was negative and she stated she was "all right".  Physical exam:  Pertinent or positive findings: She is thin but appears adequately nourished.  There is very subtle intermittent slight exotropia which is alternating.  She has an upper plate.  Only the anterior mandibular teeth remain.  She is not wearing a partial.  Breath sounds are decreased.  Heart rhythm is slightly irregular due to pauses.  There is intermittent  splitting of the first heart sound & slight accentuation of S2.  Pedal pulses are decreased.  She has a benign black nevus over the right forearm.  General appearance: no acute distress, increased work of breathing is present.   Lymphatic: No lymphadenopathy about the head, neck, axilla. Eyes: No conjunctival inflammation or lid edema is present. There is no scleral icterus. Ears:  External ear exam shows no significant lesions or deformities.   Nose:  External nasal examination shows no deformity or inflammation. Nasal mucosa are pink and moist without lesions, exudates Oral exam:  Lips and gums are healthy appearing. There is no oropharyngeal erythema or exudate. Neck:  No thyromegaly, masses, tenderness noted.    Heart:  No gallop, murmur, click, rub .  Lungs:  without wheezes, rhonchi, rales, rubs. Abdomen: Bowel sounds are normal. Abdomen is soft and nontender with no organomegaly, hernias, masses. GU: Deferred  Extremities:  No cyanosis, clubbing, edema  Neurologic exam :Balance, Rhomberg, finger to nose testing could not be completed due to clinical state Skin: Warm & dry w/o tenting. No significant lesions or rash.  See summary under each active problem in the Problem List with associated updated therapeutic plan

## 2021-08-06 NOTE — Assessment & Plan Note (Signed)
Current creatinine 1.26 / G 44  ; CKD Stage 3b Medication List reviewed; if CKD progresses  ARB will be weaned or discontinued.

## 2021-08-06 NOTE — Patient Instructions (Signed)
See assessment and plan under each diagnosis in the problem list and acutely for this visit 

## 2021-08-06 NOTE — Assessment & Plan Note (Addendum)
Plat # down from 111,000 from 115,000.  She does describe intermittent hematuria.  Staff does not validate this &  reports no other bleeding dyscrasias.  Continue to monitor.

## 2021-08-06 NOTE — Assessment & Plan Note (Addendum)
BP controlled; no change in antihypertensive medications unless CKD progresses

## 2021-08-06 NOTE — Assessment & Plan Note (Addendum)
DM with neuro &  renal  complications Glucose range in Epic: 78-181 Current A1c: 5.3 % A1c goal : <8% No hypoglycemia No change indicated; discordance may be present due to CKD

## 2021-08-08 ENCOUNTER — Other Ambulatory Visit (HOSPITAL_COMMUNITY)
Admission: RE | Admit: 2021-08-08 | Discharge: 2021-08-08 | Disposition: A | Discharge status: Home / Self Care | Payer: Medicare PPO | Source: Skilled Nursing Facility | Attending: Adult Health | Admitting: Adult Health

## 2021-08-08 ENCOUNTER — Non-Acute Institutional Stay (SKILLED_NURSING_FACILITY): Payer: Medicare PPO | Admitting: Adult Health

## 2021-08-08 DIAGNOSIS — C9000 Multiple myeloma not having achieved remission: Secondary | ICD-10-CM | POA: Diagnosis not present

## 2021-08-08 DIAGNOSIS — R627 Adult failure to thrive: Secondary | ICD-10-CM | POA: Diagnosis not present

## 2021-08-08 DIAGNOSIS — N390 Urinary tract infection, site not specified: Secondary | ICD-10-CM | POA: Insufficient documentation

## 2021-08-08 DIAGNOSIS — I700 Atherosclerosis of aorta: Secondary | ICD-10-CM | POA: Diagnosis not present

## 2021-08-08 DIAGNOSIS — F0151 Vascular dementia with behavioral disturbance: Secondary | ICD-10-CM

## 2021-08-08 DIAGNOSIS — F01518 Vascular dementia, unspecified severity, with other behavioral disturbance: Secondary | ICD-10-CM

## 2021-08-08 LAB — URINALYSIS, COMPLETE (UACMP) WITH MICROSCOPIC
Bilirubin Urine: NEGATIVE
Glucose, UA: NEGATIVE mg/dL
Ketones, ur: NEGATIVE mg/dL
Nitrite: NEGATIVE
Protein, ur: NEGATIVE mg/dL
Specific Gravity, Urine: 1.016 (ref 1.005–1.030)
WBC, UA: >50 WBC/hpf — ABNORMAL HIGH (ref 0–5)
pH: 5 (ref 5.0–8.0)

## 2021-08-08 NOTE — Progress Notes (Signed)
Location:  [facility] Room Number: 148 Place of Service:  SNF (31)   CODE STATUS: dnr  Allergies  Allergen Reactions   Lotensin [Benazepril]     Chief Complaint  Patient presents with   Acute Visit    Care plan meeting.     HPI:  We have come together for her care plan meeting.  BIMS 6/15 mood 0/30. She requires limited to extensive assist with her adls. She is frequently incontinent of bladder and bowel. She is able to feed herself. There have been no falls. Dietary  weight is 117.4 pounds stable 117-123 pounds over the past year good appetite; regular diet. Therapy none at this time.  There are no reports of uncontrolled pain; no reports of loose stools. She continues to be followed for her chronic illnesses including: Aortic atherosclerosis  Vascular dementia with behavioral disturbance  Multiple myeloma not having achieved remission  Adult failure to thrive  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HCC)    Dementia ([place])    Diabetes mellitus without complication ([place])    H/O partial nephrectomy    Hypokalemia    Hypothyroidism    Impaired cognition    Multiple myeloma (HCC)    Dr Maylon Peppers, [facility]   Thyroid disease     Past Surgical History:  Procedure Laterality Date   ABDOMINAL HYSTERECTOMY     CRANIOTOMY     for lymphoma   YAG LASER APPLICATION Right 8/92/1194   Procedure: YAG LASER APPLICATION;  Surgeon: [name], MD;  Location: [facility];  Service: Ophthalmology;  Laterality: Right;    Social History   Socioeconomic History   Marital status: Single    Spouse name: Not on file   Number of children: Not on file   Years of education: Not on file   Highest education level: Not on file  Occupational History   Occupation: retired   Tobacco Use   Smoking status: Never   Smokeless tobacco: Never  Vaping Use   Vaping Use: Never used  Substance and Sexual Activity    Alcohol use: No   Drug use: No   Sexual activity: Never  Other Topics Concern   Not on file  Social History Narrative   Long term resident of [facility]    Social Determinants of Health   Financial Resource Strain: Low Risk    Difficulty of Paying Living Expenses: Not very hard  Food Insecurity: No Food Insecurity   Worried About Charity fundraiser in the Last Year: Never true   Jackpot in the Last Year: Never true  Transportation Needs: No Transportation Needs   Lack of Transportation (Medical): No   Lack of Transportation (Non-Medical): No  Physical Activity: Inactive   Days of Exercise per Week: 0 days   Minutes of Exercise per Session: 0 min  Stress: No Stress Concern Present   Feeling of Stress : Not at all  Social Connections: Moderately Isolated   Frequency of Communication with Friends and Family: More than three times a week   Frequency of Social Gatherings with Friends and Family: Once a week   Attends Religious Services: More than 4 times per year   Active Member of [employer] or Organizations: No   Attends Archivist Meetings: Never   Marital Status: Never married  Human resources officer Violence: Not on file   Family History  Problem Relation Age of Onset   Depression Mother    Diabetes Mother    Heart disease Father    Cancer - Prostate Brother    Cancer Brother    Cancer Sister       VITAL SIGNS BP 121/74   Pulse 62   Temp 98.2 F (36.8 C)   Ht _0  (1.753 m)   Wt 117 lb 6.4 oz (53.3 kg)   BMI 17.34 kg/m   Facility-Administered Encounter Medications as of 08/08/2021  Medication   prochlorperazine (COMPAZINE) tablet 10 mg   Outpatient Encounter Medications as of 08/08/2021  Medication Sig   acetaminophen (TYLENOL) 500 MG tablet Take 1,000 mg by mouth every 6 (six) hours as needed for mild pain.   acyclovir (ZOVIRAX) 400 MG tablet TAKE 1 TABLET BY MOUTH TWICE DAILY   aspirin EC 81 MG tablet Take 81 mg by mouth daily.   busPIRone (BUSPAR)  5 MG tablet Take 5 mg by mouth 2 (two) times daily. For anxiety   Calcium Carbonate-Vitamin D (CALCIUM-VITAMIN D3 PO) Take 400 Units by mouth daily in the afternoon.   cholestyramine (QUESTRAN) 4 g packet Take 4 g by mouth 2 (two) times daily between meals.   colchicine 0.6 MG tablet Take 0.6 mg by mouth daily.   Lactobacillus Probiotic TABS Take 2 tablets by mouth daily.   levothyroxine (SYNTHROID, LEVOTHROID) 25 MCG tablet Take 25 mcg by mouth daily before breakfast.   lipase/protease/amylase (CREON) 36000 UNITS CPEP capsule Take 36,000 Units by mouth 2 (two) times daily. With snacks between meals at 10 am and 8 pm   lipase/protease/amylase (CREON) 36000 UNITS CPEP capsule Take 72,000 Units by mouth 3 (three) times daily with meals. 8 am, 12 pm, and 6 pm   loperamide (IMODIUM A-D) 2 MG tablet Take 4 mg by mouth 3 (three) times daily. For diarrhea   losartan (COZAAR) 50 MG tablet Take 1 tablet (50 mg total) by mouth daily.   Multiple Vitamins-Minerals (MULTIVITAMIN WOMEN 50+ PO) Take by mouth daily.    NON FORMULARY Regular diet no dairy products.   NON FORMULARY Wanderguard #2237 to ankle for safety awareness. Check placement and function qshift. Special Instructions: Check placement and function qshift. Every Shift Day, Evening, Night   potassium chloride SA (KLOR-CON) 20 MEQ tablet Take 20 mEq by mouth daily.      SIGNIFICANT DIAGNOSTIC EXAMS   PREVIOUS   08-10-20: t score: -3.758  12-21-20: KUBThere is a mild ileus noted. Mild to moderate increase feces in the colon. No renal stone is seen  12-25-20: KUB  There is a nonspecific bowel gas pattern without obstruction. No residual ileus is noted. Mild increased feces in the colon. No renal stone is seen.  02-05-21: ct of head:  1. No acute intracranial abnormality. Please note if concern for acute infarction MRI would be more sensitive. 2. Similar appearance of bifrontal encephalomalacia consistent with prior  infarction/insult  02-05-21: MRI of brain:  No evidence of recent infarction, hemorrhage, or mass. Bifrontal encephalomalacia/gliosis. Additional chronic microvascular ischemic changes. Questioned small left anterior clinoid process calcified meningioma on prior CT imaging is not well evaluated due to size and lack of contrast.  07-09-21 right knee x-ray:  1. No definite radiographic evidence of acute fracture or dislocation. If there are persistent symptoms, followup x ray may be obtained as clinically warranted.  2. Mild joint effusion.  3. Mild osteopenia.  4. Mild degree of osteoarthritis.  NO NEW EXAMS.     LABS  REVIEWED PREVIOUS    07-20-20: vit B 12: 620  07-23-20: tsh 2.264 07-24-20: wbc 3.9; hgb 9.3; hct 29.3 mcv 105.4 plt 105; glucose 106; bun 25; creat 1.28; k+ 3.6; na++ 142; a 9.2 liver normal albumin 3.5  07-30-20: chol 116; ldl 49; trig 71; hdl 53; hgb a1c 5.1; hep C neg  08-07-20: wbc 3.5; hgb 9.0; hct 27.9; mcv 104.5 plt 112; glucose 86; bun 27; creat 1.22; k+ 4.2; an++ 143; ca 9.0 liver normal albumin 3.2 08-14-20: wbc 2.8; hgb 9.4; hct 29.2; mcv 104.3 plt 101; glucose 71; bun 27; creat 1.41; k+ 4.3; na++ 140; ca 9.0 liver normal albumin 3.1  08-16-20: urine micro-albumin 9.3 08-21-20: wbc 3.9; hgb 9.5; hct 29.7 mcv 104.2 plt 86; glucose 86; bun 18; creat 1.09; k+ 3.7; na++ 144; ca 8.1; liver normal albumin 3.1  09-18-20: wbc 4.4; hgb 9.6; hct 29.7 mcv 102.4 plt 105; glucose 99; bun 20; creat 1.12; k+ 3.2; na++ 142; ca 8.8 liver normal albumin 3.2  10-16-20: wbc 3.9; hgb 10.2; hct 32.3; mcv 104.9 plt 95; glucose 98; bun 19; creat 1.25; k+ 3.9; na++ 139; ca 8.8 liver normal albumin 3.2  12-04-20: wbc 3.1; hgb 10.9; hct 34.6; mcv 103.0 plt 130; glucose 132; bun 27; creat 1.35; k+ 3.6; na++ 143; ca 9.3 liver normal albumin 3.5  12-25-20: wbc 2.7; hgb 10.1; hct 31.2 mcv 103.0 plt 120; glucose 108; bun 13; creat 1.53; k+ 3.9; na++ 138; ca 8.7 liver normal albumin 3.2 GFR 3 01-01-21: wbc 2.5;  hgb 10.6; hct 32.1; mcv 100.6 plt 87; glucose 87; bun 27; creat 1.24; k+ 3.8; na++ 144; ca 9.3 GFR 45; liver normal albumin 3.4  02-05-21: wbc 3.9; hgb 10.2; hct 31.6; mcv 102.9 plt 91; glucose 118; bun 26; creat 1.29; k+ 3.8; na++ 138; ca 8.7 GFR43 liver normal albumin 3.0 urine culture: e-coli 02-07-21: hgb a1c 5.3 04-08-21: chol 142; ldl 66; trig 101; hdl 56 04-09-21: wbc 4.6; hgb 11.3; hct 35.3; mcv 100.6 plt 140; glucose 101; bun 20; creat 1.13; k+ 3.5; na++ 139; ca 8.5 GFR 50; liver normal albumin 3.4  07-15-21: wbc 4.7; hgb 11.7; hct 35.9; mcv 97.0 plt 220; glucose 161; bun 21; creat 1.23; k+ 3.9; na++ 138; ca 8.8; GFR 46; liver normal albumin 3.3; uric acid 4.4   NO NEW LABS.   Review of Systems  Constitutional:  Negative for malaise/fatigue.  Respiratory:  Negative for cough and shortness of breath.   Cardiovascular:  Negative for chest pain, palpitations and leg swelling.  Gastrointestinal:  Negative for abdominal pain, constipation and heartburn.  Musculoskeletal:  Negative for back pain, joint pain and myalgias.  Skin: Negative.   Neurological:  Negative for dizziness.  Psychiatric/Behavioral:  The patient is not nervous/anxious.     Physical Exam Constitutional:      General: She is not in acute distress.    Appearance: She is cachectic. She is not diaphoretic.  Neck:     Thyroid: No thyromegaly.  Cardiovascular:     Rate and Rhythm: Normal rate and regular rhythm.     Pulses: Normal pulses.     Heart sounds: Normal heart sounds.  Pulmonary:     Effort: Pulmonary effort is normal. No respiratory distress.     Breath sounds: Normal breath sounds.  Abdominal:     General: Bowel sounds are normal. There is no distension.     Palpations: Abdomen is soft.     Tenderness: There is no abdominal tenderness.  Musculoskeletal:  General: Normal range of motion.     Cervical back: Neck supple.     Right lower leg: No edema.     Left lower leg: No edema.  Lymphadenopathy:      Cervical: No cervical adenopathy.  Skin:    General: Skin is warm and dry.  Neurological:     Mental Status: She is alert. Mental status is at baseline.  Psychiatric:        Mood and Affect: Mood normal.      ASSESSMENT/ PLAN:  TODAY  Aortic atherosclerosis Vascular dementia with behavioral disturbance Multiple myeloma not having achieved remission Adult failure to thrive  Will continue current medications Will continue current plan of care Will continue to monitor her status.    Time spent with patient: 40 minutes: care plan medication; overall health status.    Ok Edwards NP [facility]  Contact [phone] Monday through Friday 8am- 5pm  After hours call [phone]

## 2021-08-11 LAB — URINE CULTURE: Culture: 70000 — AB

## 2021-08-12 ENCOUNTER — Inpatient Hospital Stay (HOSPITAL_COMMUNITY): Payer: Medicare PPO

## 2021-08-12 DIAGNOSIS — D631 Anemia in chronic kidney disease: Secondary | ICD-10-CM | POA: Diagnosis not present

## 2021-08-12 DIAGNOSIS — I129 Hypertensive chronic kidney disease with stage 1 through stage 4 chronic kidney disease, or unspecified chronic kidney disease: Secondary | ICD-10-CM | POA: Diagnosis not present

## 2021-08-12 DIAGNOSIS — C9000 Multiple myeloma not having achieved remission: Secondary | ICD-10-CM

## 2021-08-12 DIAGNOSIS — F0390 Unspecified dementia without behavioral disturbance: Secondary | ICD-10-CM | POA: Diagnosis not present

## 2021-08-12 DIAGNOSIS — Z5111 Encounter for antineoplastic chemotherapy: Secondary | ICD-10-CM | POA: Diagnosis not present

## 2021-08-12 DIAGNOSIS — Z8572 Personal history of non-Hodgkin lymphomas: Secondary | ICD-10-CM | POA: Diagnosis not present

## 2021-08-12 DIAGNOSIS — E119 Type 2 diabetes mellitus without complications: Secondary | ICD-10-CM | POA: Diagnosis not present

## 2021-08-12 DIAGNOSIS — E039 Hypothyroidism, unspecified: Secondary | ICD-10-CM | POA: Diagnosis not present

## 2021-08-12 DIAGNOSIS — N189 Chronic kidney disease, unspecified: Secondary | ICD-10-CM | POA: Diagnosis not present

## 2021-08-12 LAB — COMPREHENSIVE METABOLIC PANEL
ALT: 38 U/L (ref 0–44)
AST: 46 U/L — ABNORMAL HIGH (ref 15–41)
Albumin: 2.9 g/dL — ABNORMAL LOW (ref 3.5–5.0)
Alkaline Phosphatase: 126 U/L (ref 38–126)
Anion gap: 4 — ABNORMAL LOW (ref 5–15)
BUN: 20 mg/dL (ref 8–23)
CO2: 26 mmol/L (ref 22–32)
Calcium: 8.7 mg/dL — ABNORMAL LOW (ref 8.9–10.3)
Chloride: 109 mmol/L (ref 98–111)
Creatinine, Ser: 1.24 mg/dL — ABNORMAL HIGH (ref 0.44–1.00)
GFR, Estimated: 45 mL/min — ABNORMAL LOW (ref >60)
Glucose, Bld: 165 mg/dL — ABNORMAL HIGH (ref 70–99)
Potassium: 4.7 mmol/L (ref 3.5–5.1)
Sodium: 139 mmol/L (ref 135–145)
Total Bilirubin: 0.4 mg/dL (ref 0.3–1.2)
Total Protein: 6.4 g/dL — ABNORMAL LOW (ref 6.5–8.1)

## 2021-08-12 LAB — CBC WITH DIFFERENTIAL/PLATELET
Abs Immature Granulocytes: 0.03 10*3/uL (ref 0.00–0.07)
Basophils Absolute: 0 10*3/uL (ref 0.0–0.1)
Basophils Relative: 0 %
Eosinophils Absolute: 0.1 10*3/uL (ref 0.0–0.5)
Eosinophils Relative: 2 %
HCT: 32.9 % — ABNORMAL LOW (ref 36.0–46.0)
Hemoglobin: 10.4 g/dL — ABNORMAL LOW (ref 12.0–15.0)
Immature Granulocytes: 0 %
Lymphocytes Relative: 5 %
Lymphs Abs: 0.4 10*3/uL — ABNORMAL LOW (ref 0.7–4.0)
MCH: 31.3 pg (ref 26.0–34.0)
MCHC: 31.6 g/dL (ref 30.0–36.0)
MCV: 99.1 fL (ref 80.0–100.0)
Monocytes Absolute: 0.6 10*3/uL (ref 0.1–1.0)
Monocytes Relative: 7 %
Neutro Abs: 7.7 10*3/uL (ref 1.7–7.7)
Neutrophils Relative %: 86 %
Platelets: 155 10*3/uL (ref 150–400)
RBC: 3.32 MIL/uL — ABNORMAL LOW (ref 3.87–5.11)
RDW: 17.2 % — ABNORMAL HIGH (ref 11.5–15.5)
WBC: 8.8 10*3/uL (ref 4.0–10.5)
nRBC: 0 % (ref 0.0–0.2)

## 2021-08-13 ENCOUNTER — Non-Acute Institutional Stay (SKILLED_NURSING_FACILITY): Payer: Medicare PPO | Admitting: Adult Health

## 2021-08-13 ENCOUNTER — Inpatient Hospital Stay (HOSPITAL_COMMUNITY): Payer: Medicare PPO

## 2021-08-13 VITALS — BP 138/81 | HR 81 | Temp 96.9°F | Resp 18 | Wt 115.8 lb

## 2021-08-13 DIAGNOSIS — Z5111 Encounter for antineoplastic chemotherapy: Secondary | ICD-10-CM | POA: Diagnosis not present

## 2021-08-13 DIAGNOSIS — I129 Hypertensive chronic kidney disease with stage 1 through stage 4 chronic kidney disease, or unspecified chronic kidney disease: Secondary | ICD-10-CM | POA: Diagnosis not present

## 2021-08-13 DIAGNOSIS — C9000 Multiple myeloma not having achieved remission: Secondary | ICD-10-CM | POA: Diagnosis not present

## 2021-08-13 DIAGNOSIS — E039 Hypothyroidism, unspecified: Secondary | ICD-10-CM | POA: Diagnosis not present

## 2021-08-13 DIAGNOSIS — J189 Pneumonia, unspecified organism: Secondary | ICD-10-CM

## 2021-08-13 DIAGNOSIS — N189 Chronic kidney disease, unspecified: Secondary | ICD-10-CM | POA: Diagnosis not present

## 2021-08-13 DIAGNOSIS — D631 Anemia in chronic kidney disease: Secondary | ICD-10-CM | POA: Diagnosis not present

## 2021-08-13 DIAGNOSIS — Z8572 Personal history of non-Hodgkin lymphomas: Secondary | ICD-10-CM | POA: Diagnosis not present

## 2021-08-13 DIAGNOSIS — F0390 Unspecified dementia without behavioral disturbance: Secondary | ICD-10-CM | POA: Diagnosis not present

## 2021-08-13 DIAGNOSIS — E119 Type 2 diabetes mellitus without complications: Secondary | ICD-10-CM | POA: Diagnosis not present

## 2021-08-13 MED ORDER — DEXAMETHASONE 4 MG PO TABS
10.0000 mg | ORAL_TABLET | Freq: Once | ORAL | Status: AC
  Administered 2021-08-13: 10 mg via ORAL
  Filled 2021-08-13: qty 3

## 2021-08-13 MED ORDER — BORTEZOMIB CHEMO SQ INJECTION 3.5 MG (2.5MG/ML)
1.3000 mg/m2 | Freq: Once | INTRAMUSCULAR | Status: AC
  Administered 2021-08-13: 2 mg via SUBCUTANEOUS
  Filled 2021-08-13: qty 0.8

## 2021-08-13 NOTE — Progress Notes (Signed)
Patient presents today for Velcade injection per providers order.  Vital signs and Labs within parameters for treatment.  Stable during administration without incident; injection site WNL; see MAR for injection details.  Patient tolerated procedure well and without incident.  No questions or complaints noted at this time.  Discharge from clinic via wheelchair in stable condition.  Alert and oriented X 3.  Follow up with [facility] as scheduled.

## 2021-08-13 NOTE — Patient Instructions (Signed)
[facility]  Discharge Instructions: Thank you for choosing [facility] to provide your oncology and hematology care.  If you have a lab appointment with the Cancer Center, please come in thru the Main Entrance and check in at the main information desk.  Wear comfortable clothing and clothing appropriate for easy access to any Portacath or PICC line.   We strive to give you quality time with your provider. You may need to reschedule your appointment if you arrive late (15 or more minutes).  Arriving late affects you and other patients whose appointments are after yours.  Also, if you miss three or more appointments without notifying the office, you may be dismissed from the clinic at the provider's discretion.      For prescription refill requests, have your pharmacy contact our office and allow 72 hours for refills to be completed.    Today you received the following chemotherapy and/or immunotherapy agents Retacrit      To help prevent nausea and vomiting after your treatment, we encourage you to take your nausea medication as directed.  BELOW ARE SYMPTOMS THAT SHOULD BE REPORTED IMMEDIATELY: *FEVER GREATER THAN 100.4 F (38 C) OR HIGHER *CHILLS OR SWEATING *NAUSEA AND VOMITING THAT IS NOT CONTROLLED WITH YOUR NAUSEA MEDICATION *UNUSUAL SHORTNESS OF BREATH *UNUSUAL BRUISING OR BLEEDING *URINARY PROBLEMS (pain or burning when urinating, or frequent urination) *BOWEL PROBLEMS (unusual diarrhea, constipation, pain near the anus) TENDERNESS IN MOUTH AND THROAT WITH OR WITHOUT PRESENCE OF ULCERS (sore throat, sores in mouth, or a toothache) UNUSUAL RASH, SWELLING OR PAIN  UNUSUAL VAGINAL DISCHARGE OR ITCHING   Items with * indicate a potential emergency and should be followed up as soon as possible or go to the Emergency Department if any problems should occur.  Please show the CHEMOTHERAPY ALERT CARD or IMMUNOTHERAPY ALERT CARD at check-in to the Emergency  Department and triage nurse.  Should you have questions after your visit or need to cancel or reschedule your appointment, please contact [facility] [phone]  and follow the prompts.  Office hours are 8:00 a.m. to 4:30 p.m. Monday - Friday. Please note that voicemails left after 4:00 p.m. may not be returned until the following business day.  We are closed weekends and major holidays. You have access to a nurse at all times for urgent questions. Please call the main number to the clinic [phone] and follow the prompts.  For any non-urgent questions, you may also contact your provider using MyChart. We now offer e-Visits for anyone 18 and older to request care online for non-urgent symptoms. For details visit [email or web address].   Also download the MyChart app! Go to the app store, search "MyChart", open the app, select [place], and log in with your MyChart username and password.  Due to Covid, a mask is required upon entering the hospital/clinic. If you do not have a mask, one will be given to you upon arrival. For doctor visits, patients may have 1 support person aged 18 or older with them. For treatment visits, patients cannot have anyone with them due to current Covid guidelines and our immunocompromised population.  

## 2021-08-14 ENCOUNTER — Encounter: Payer: Self-pay | Admitting: Adult Health

## 2021-08-14 DIAGNOSIS — J189 Pneumonia, unspecified organism: Secondary | ICD-10-CM | POA: Insufficient documentation

## 2021-08-14 NOTE — Progress Notes (Signed)
Location:  [place] Room Number: 148 Place of Service:  SNF (31)   CODE STATUS: dnr  Allergies  Allergen Reactions   Lotensin [Benazepril]     Chief Complaint  Patient presents with   Acute Visit    cough    HPI:  Staff report that she has developed a cough without sputum she did run a temp of 100.2 last pm. She tells me that she feels good. There are no reports of changes in appetite. She denies any sore throat. She is a poor historian.   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[place])    Dementia ([place])    Diabetes mellitus without complication ([place])    H/O partial nephrectomy    Hypokalemia    Hypothyroidism    Impaired cognition    Multiple myeloma (HCC)    Dr Maylon Peppers, [facility]   Thyroid disease     Past Surgical History:  Procedure Laterality Date   ABDOMINAL HYSTERECTOMY     CRANIOTOMY     for lymphoma   YAG LASER APPLICATION Right 7/34/0370   Procedure: YAG LASER APPLICATION;  Surgeon: [name], MD;  Location: [facility];  Service: Ophthalmology;  Laterality: Right;    Social History   Socioeconomic History   Marital status: Single    Spouse name: Not on file   Number of children: Not on file   Years of education: Not on file   Highest education level: Not on file  Occupational History   Occupation: retired   Tobacco Use   Smoking status: Never   Smokeless tobacco: Never  Vaping Use   Vaping Use: Never used  Substance and Sexual Activity   Alcohol use: No   Drug use: No   Sexual activity: Never  Other Topics Concern   Not on file  Social History Narrative   Long term resident of [facility]    Social Determinants of Health   Financial Resource Strain: Low Risk    Difficulty of Paying Living Expenses: Not very hard  Food Insecurity: No Food Insecurity   Worried About Charity fundraiser in the Last Year: Never true   [place] in the Last Year:  Never true  Transportation Needs: No Transportation Needs   Lack of Transportation (Medical): No   Lack of Transportation (Non-Medical): No  Physical Activity: Inactive   Days of Exercise per Week: 0 days   Minutes of Exercise per Session: 0 min  Stress: No Stress Concern Present   Feeling of Stress : Not at all  Social Connections: Moderately Isolated   Frequency of Communication with Friends and Family: More than three times a week   Frequency of Social Gatherings with Friends and Family: Once a week   Attends Religious Services: More than 4 times per year   Active Member of [employer] or Organizations: No   Attends Archivist Meetings: Never   Marital Status: Never married  Human resources officer Violence: Not on file   Family History  Problem Relation Age of Onset   Depression Mother    Diabetes Mother    Heart disease Father    Cancer - Prostate Brother    Cancer Brother    Cancer Sister       VITAL SIGNS BP 114/69   Pulse 77   Temp 98.2 F (36.8 C)   Ht '5\' 9"'  (1.753 m)  Wt 117 lb 6.4 oz (53.3 kg)   BMI 17.34 kg/m   Facility-Administered Encounter Medications as of 08/13/2021  Medication   prochlorperazine (COMPAZINE) tablet 10 mg   Outpatient Encounter Medications as of 08/13/2021  Medication Sig   acetaminophen (TYLENOL) 500 MG tablet Take 1,000 mg by mouth every 6 (six) hours as needed for mild pain.   acyclovir (ZOVIRAX) 400 MG tablet TAKE 1 TABLET BY MOUTH TWICE DAILY   aspirin EC 81 MG tablet Take 81 mg by mouth daily.   busPIRone (BUSPAR) 5 MG tablet Take 5 mg by mouth 2 (two) times daily. For anxiety   Calcium Carbonate-Vitamin D (CALCIUM-VITAMIN D3 PO) Take 400 Units by mouth daily in the afternoon.   cholestyramine (QUESTRAN) 4 g packet Take 4 g by mouth 2 (two) times daily between meals.   Lactobacillus Probiotic TABS Take 2 tablets by mouth daily.   levothyroxine (SYNTHROID, LEVOTHROID) 25 MCG tablet Take 25 mcg by mouth daily before breakfast.    lipase/protease/amylase (CREON) 36000 UNITS CPEP capsule Take 36,000 Units by mouth 2 (two) times daily. With snacks between meals at 10 am and 8 pm   lipase/protease/amylase (CREON) 36000 UNITS CPEP capsule Take 72,000 Units by mouth 3 (three) times daily with meals. 8 am, 12 pm, and 6 pm   loperamide (IMODIUM A-D) 2 MG tablet Take 4 mg by mouth 3 (three) times daily. For diarrhea   losartan (COZAAR) 50 MG tablet Take 1 tablet (50 mg total) by mouth daily.   Multiple Vitamins-Minerals (MULTIVITAMIN WOMEN 50+ PO) Take by mouth daily.    NON FORMULARY Regular diet no dairy products.   NON FORMULARY Wanderguard #2237 to ankle for safety awareness. Check placement and function qshift. Special Instructions: Check placement and function qshift. Every Shift Day, Evening, Night   potassium chloride SA (KLOR-CON) 20 MEQ tablet Take 20 mEq by mouth daily.      SIGNIFICANT DIAGNOSTIC EXAMS   PREVIOUS   08-10-20: t score: -3.758  12-21-20: KUBThere is a mild ileus noted. Mild to moderate increase feces in the colon. No renal stone is seen  12-25-20: KUB  There is a nonspecific bowel gas pattern without obstruction. No residual ileus is noted. Mild increased feces in the colon. No renal stone is seen.  02-05-21: ct of head:  1. No acute intracranial abnormality. Please note if concern for acute infarction MRI would be more sensitive. 2. Similar appearance of bifrontal encephalomalacia consistent with prior infarction/insult  02-05-21: MRI of brain:  No evidence of recent infarction, hemorrhage, or mass. Bifrontal encephalomalacia/gliosis. Additional chronic microvascular ischemic changes. Questioned small left anterior clinoid process calcified meningioma on prior CT imaging is not well evaluated due to size and lack of contrast.  07-09-21 right knee x-ray:  1. No definite radiographic evidence of acute fracture or dislocation. If there are persistent symptoms, followup x ray may be obtained as  clinically warranted.  2. Mild joint effusion.  3. Mild osteopenia.  4. Mild degree of osteoarthritis.  NO NEW EXAMS.     LABS REVIEWED PREVIOUS    07-20-20: vit B 12: 620  07-23-20: tsh 2.264 07-24-20: wbc 3.9; hgb 9.3; hct 29.3 mcv 105.4 plt 105; glucose 106; bun 25; creat 1.28; k+ 3.6; na++ 142; a 9.2 liver normal albumin 3.5  07-30-20: chol 116; ldl 49; trig 71; hdl 53; hgb a1c 5.1; hep C neg  08-07-20: wbc 3.5; hgb 9.0; hct 27.9; mcv 104.5 plt 112; glucose 86; bun 27; creat 1.22; k+ 4.2; an++ 143;  ca 9.0 liver normal albumin 3.2 08-14-20: wbc 2.8; hgb 9.4; hct 29.2; mcv 104.3 plt 101; glucose 71; bun 27; creat 1.41; k+ 4.3; na++ 140; ca 9.0 liver normal albumin 3.1  08-16-20: urine micro-albumin 9.3 08-21-20: wbc 3.9; hgb 9.5; hct 29.7 mcv 104.2 plt 86; glucose 86; bun 18; creat 1.09; k+ 3.7; na++ 144; ca 8.1; liver normal albumin 3.1  09-18-20: wbc 4.4; hgb 9.6; hct 29.7 mcv 102.4 plt 105; glucose 99; bun 20; creat 1.12; k+ 3.2; na++ 142; ca 8.8 liver normal albumin 3.2  10-16-20: wbc 3.9; hgb 10.2; hct 32.3; mcv 104.9 plt 95; glucose 98; bun 19; creat 1.25; k+ 3.9; na++ 139; ca 8.8 liver normal albumin 3.2  12-04-20: wbc 3.1; hgb 10.9; hct 34.6; mcv 103.0 plt 130; glucose 132; bun 27; creat 1.35; k+ 3.6; na++ 143; ca 9.3 liver normal albumin 3.5  12-25-20: wbc 2.7; hgb 10.1; hct 31.2 mcv 103.0 plt 120; glucose 108; bun 13; creat 1.53; k+ 3.9; na++ 138; ca 8.7 liver normal albumin 3.2 GFR 3 01-01-21: wbc 2.5; hgb 10.6; hct 32.1; mcv 100.6 plt 87; glucose 87; bun 27; creat 1.24; k+ 3.8; na++ 144; ca 9.3 GFR 45; liver normal albumin 3.4  02-05-21: wbc 3.9; hgb 10.2; hct 31.6; mcv 102.9 plt 91; glucose 118; bun 26; creat 1.29; k+ 3.8; na++ 138; ca 8.7 GFR43 liver normal albumin 3.0 urine culture: e-coli 02-07-21: hgb a1c 5.3 04-08-21: chol 142; ldl 66; trig 101; hdl 56 04-09-21: wbc 4.6; hgb 11.3; hct 35.3; mcv 100.6 plt 140; glucose 101; bun 20; creat 1.13; k+ 3.5; na++ 139; ca 8.5 GFR 50; liver normal  albumin 3.4  07-15-21: wbc 4.7; hgb 11.7; hct 35.9; mcv 97.0 plt 220; glucose 161; bun 21; creat 1.23; k+ 3.9; na++ 138; ca 8.8; GFR 46; liver normal albumin 3.3; uric acid 4.4   NO NEW LABS.   Review of Systems  Constitutional:  Negative for malaise/fatigue.  Respiratory:  Negative for cough and shortness of breath.   Cardiovascular:  Negative for chest pain, palpitations and leg swelling.  Gastrointestinal:  Negative for abdominal pain, constipation and heartburn.  Musculoskeletal:  Negative for back pain, joint pain and myalgias.  Skin: Negative.   Neurological:  Negative for dizziness.  Psychiatric/Behavioral:  The patient is not nervous/anxious.    Physical Exam Constitutional:      General: She is not in acute distress.    Appearance: She is cachectic. She is not diaphoretic.  Neck:     Thyroid: No thyromegaly.  Cardiovascular:     Rate and Rhythm: Normal rate and regular rhythm.     Pulses: Normal pulses.     Heart sounds: Normal heart sounds.  Pulmonary:     Effort: Pulmonary effort is normal. No respiratory distress.     Comments: Left lower lobe rhonchi and rales  Abdominal:     General: Bowel sounds are normal. There is no distension.     Palpations: Abdomen is soft.     Tenderness: There is no abdominal tenderness.  Musculoskeletal:        General: Normal range of motion.     Cervical back: Neck supple.     Right lower leg: No edema.     Left lower leg: No edema.  Lymphadenopathy:     Cervical: No cervical adenopathy.  Skin:    General: Skin is warm and dry.  Neurological:     Mental Status: She is alert. Mental status is at baseline.  Psychiatric:  Mood and Affect: Mood normal.     ASSESSMENT/ PLAN:  TODAY  Left lower lobe pneumonia due to infectious organism  Is worse Will begin doxycycline 100 mg twice daily through 08-16-21.    Ok Edwards NP [facility]  Contact [phone] Monday through Friday 8am- 5pm  After hours call  [phone]

## 2021-08-15 ENCOUNTER — Encounter: Payer: Self-pay | Admitting: Adult Health

## 2021-08-15 NOTE — Progress Notes (Signed)
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[place])    Dementia ([place])    Diabetes mellitus without complication ([place])    H/O partial nephrectomy    Hypokalemia    Hypothyroidism    Impaired cognition    Multiple myeloma ([place])    Dr Maylon Peppers, [facility]   Thyroid disease     Past Surgical History:  Procedure Laterality Date   ABDOMINAL HYSTERECTOMY     CRANIOTOMY     for lymphoma   YAG LASER APPLICATION Right 9/32/3557   Procedure: YAG LASER APPLICATION;  Surgeon: [name], MD;  Location: [facility];  Service: Ophthalmology;  Laterality: Right;    Social History   Socioeconomic History   Marital status: Single    Spouse name: Not on file   Number of children: Not on file   Years of education: Not on file   Highest education level: Not on file  Occupational History   Occupation: retired   Tobacco Use   Smoking status: Never   Smokeless tobacco: Never  Vaping Use   Vaping Use: Never used  Substance and Sexual Activity   Alcohol use: No   Drug use: No   Sexual activity: Never  Other Topics Concern   Not on file  Social History Narrative   Long term resident of [facility]    Social Determinants of Health   Financial Resource Strain: Low Risk    Difficulty of Paying Living Expenses: Not very hard  Food Insecurity: No Food Insecurity   Worried About Charity fundraiser in the Last Year: Never true   [place] in the Last Year: Never true  Transportation Needs: No Transportation Needs   Lack of Transportation (Medical): No   Lack of Transportation (Non-Medical): No  Physical Activity: Inactive   Days of Exercise per Week: 0 days    Minutes of Exercise per Session: 0 min  Stress: No Stress Concern Present   Feeling of Stress : Not at all  Social Connections: Moderately Isolated   Frequency of Communication with Friends and Family: More than three times a week   Frequency of Social Gatherings with Friends and Family: Once a week   Attends Religious Services: More than 4 times per year   Active Member of [employer] or Organizations: No   Attends Archivist Meetings: Never   Marital Status: Never married  Human resources officer Violence: Not on file   Family History  Problem Relation Age of Onset   Depression Mother    Diabetes Mother    Heart disease Father    Cancer - Prostate Brother    Cancer Brother    Cancer Sister       VITAL SIGNS BP 114/69   Pulse 77   Temp 98.2 F (36.8 C)   Resp 20   Ht '5\' 9"'  (1.753 m)   Wt 117 lb 6.4 oz (53.3 kg)   SpO2 97%   BMI 17.34 kg/m   Facility-Administered Encounter Medications as of 08/15/2021  Medication   prochlorperazine (COMPAZINE) tablet 10 mg   Outpatient Encounter  Medications as of 08/15/2021  Medication Sig   acetaminophen (TYLENOL) 500 MG tablet Take 1,000 mg by mouth every 6 (six) hours as needed for mild pain.   acyclovir (ZOVIRAX) 400 MG tablet TAKE 1 TABLET BY MOUTH TWICE DAILY   Calcium Carbonate-Vitamin D (CALCIUM-VITAMIN D3 PO) Take 400 Units by mouth daily in the afternoon.   cholestyramine (QUESTRAN) 4 g packet Take 4 g by mouth 2 (two) times daily between meals.   Lactobacillus Probiotic TABS Take 2 tablets by mouth daily.   levothyroxine (SYNTHROID, LEVOTHROID) 25 MCG tablet Take 25 mcg by mouth daily before breakfast.   lipase/protease/amylase (CREON) 36000 UNITS CPEP capsule Take 36,000 Units by mouth 2 (two) times daily. With snacks between meals at 10 am and 8 pm   losartan (COZAAR) 50 MG tablet Take 1 tablet (50 mg total) by mouth daily.   Multiple Vitamins-Minerals (MULTIVITAMIN WOMEN 50+ PO) Take by mouth daily.    NON FORMULARY Regular  diet no dairy products.   NON FORMULARY Wanderguard #2237 to ankle for safety awareness. Check placement and function qshift. Special Instructions: Check placement and function qshift. Every Shift Day, Evening, Night   potassium chloride SA (KLOR-CON) 20 MEQ tablet Take 20 mEq by mouth daily.    aspirin EC 81 MG tablet Take 81 mg by mouth daily.   busPIRone (BUSPAR) 5 MG tablet Take 5 mg by mouth 2 (two) times daily. For anxiety   colchicine 0.6 MG tablet Take 0.6 mg by mouth daily.   lipase/protease/amylase (CREON) 36000 UNITS CPEP capsule Take 72,000 Units by mouth 3 (three) times daily with meals. 8 am, 12 pm, and 6 pm   loperamide (IMODIUM A-D) 2 MG tablet Take 4 mg by mouth 3 (three) times daily. For diarrhea     SIGNIFICANT DIAGNOSTIC EXAMS       ASSESSMENT/ PLAN:     Ok Edwards NP [facility]  Contact [phone] Monday through Friday 8am- 5pm  After hours call [phone]

## 2021-08-16 ENCOUNTER — Inpatient Hospital Stay (HOSPITAL_COMMUNITY): Payer: Medicare PPO | Attending: Hematology

## 2021-08-16 ENCOUNTER — Other Ambulatory Visit: Payer: Self-pay

## 2021-08-16 DIAGNOSIS — E1122 Type 2 diabetes mellitus with diabetic chronic kidney disease: Secondary | ICD-10-CM | POA: Diagnosis not present

## 2021-08-16 DIAGNOSIS — D649 Anemia, unspecified: Secondary | ICD-10-CM | POA: Insufficient documentation

## 2021-08-16 DIAGNOSIS — E1136 Type 2 diabetes mellitus with diabetic cataract: Secondary | ICD-10-CM | POA: Insufficient documentation

## 2021-08-16 DIAGNOSIS — I129 Hypertensive chronic kidney disease with stage 1 through stage 4 chronic kidney disease, or unspecified chronic kidney disease: Secondary | ICD-10-CM | POA: Insufficient documentation

## 2021-08-16 DIAGNOSIS — Z5111 Encounter for antineoplastic chemotherapy: Secondary | ICD-10-CM | POA: Diagnosis not present

## 2021-08-16 DIAGNOSIS — R197 Diarrhea, unspecified: Secondary | ICD-10-CM | POA: Insufficient documentation

## 2021-08-16 DIAGNOSIS — E039 Hypothyroidism, unspecified: Secondary | ICD-10-CM | POA: Insufficient documentation

## 2021-08-16 DIAGNOSIS — Z8572 Personal history of non-Hodgkin lymphomas: Secondary | ICD-10-CM | POA: Insufficient documentation

## 2021-08-16 DIAGNOSIS — K5900 Constipation, unspecified: Secondary | ICD-10-CM | POA: Diagnosis not present

## 2021-08-16 DIAGNOSIS — F0390 Unspecified dementia without behavioral disturbance: Secondary | ICD-10-CM | POA: Insufficient documentation

## 2021-08-16 DIAGNOSIS — C9000 Multiple myeloma not having achieved remission: Secondary | ICD-10-CM | POA: Insufficient documentation

## 2021-08-16 DIAGNOSIS — Z79899 Other long term (current) drug therapy: Secondary | ICD-10-CM | POA: Diagnosis not present

## 2021-08-16 LAB — CBC WITH DIFFERENTIAL/PLATELET
Abs Immature Granulocytes: 0.07 10*3/uL (ref 0.00–0.07)
Basophils Absolute: 0 10*3/uL (ref 0.0–0.1)
Basophils Relative: 0 %
Eosinophils Absolute: 0.2 10*3/uL (ref 0.0–0.5)
Eosinophils Relative: 2 %
HCT: 33.7 % — ABNORMAL LOW (ref 36.0–46.0)
Hemoglobin: 10.7 g/dL — ABNORMAL LOW (ref 12.0–15.0)
Immature Granulocytes: 1 %
Lymphocytes Relative: 7 %
Lymphs Abs: 0.6 10*3/uL — ABNORMAL LOW (ref 0.7–4.0)
MCH: 31.3 pg (ref 26.0–34.0)
MCHC: 31.8 g/dL (ref 30.0–36.0)
MCV: 98.5 fL (ref 80.0–100.0)
Monocytes Absolute: 0.6 10*3/uL (ref 0.1–1.0)
Monocytes Relative: 7 %
Neutro Abs: 7.2 10*3/uL (ref 1.7–7.7)
Neutrophils Relative %: 83 %
Platelets: 152 10*3/uL (ref 150–400)
RBC: 3.42 MIL/uL — ABNORMAL LOW (ref 3.87–5.11)
RDW: 17.2 % — ABNORMAL HIGH (ref 11.5–15.5)
WBC: 8.7 10*3/uL (ref 4.0–10.5)
nRBC: 0 % (ref 0.0–0.2)

## 2021-08-16 LAB — COMPREHENSIVE METABOLIC PANEL
ALT: 34 U/L (ref 0–44)
AST: 40 U/L (ref 15–41)
Albumin: 2.7 g/dL — ABNORMAL LOW (ref 3.5–5.0)
Alkaline Phosphatase: 122 U/L (ref 38–126)
Anion gap: 8 (ref 5–15)
BUN: 28 mg/dL — ABNORMAL HIGH (ref 8–23)
CO2: 24 mmol/L (ref 22–32)
Calcium: 8.8 mg/dL — ABNORMAL LOW (ref 8.9–10.3)
Chloride: 110 mmol/L (ref 98–111)
Creatinine, Ser: 1.12 mg/dL — ABNORMAL HIGH (ref 0.44–1.00)
GFR, Estimated: 51 mL/min — ABNORMAL LOW (ref >60)
Glucose, Bld: 161 mg/dL — ABNORMAL HIGH (ref 70–99)
Potassium: 4.1 mmol/L (ref 3.5–5.1)
Sodium: 142 mmol/L (ref 135–145)
Total Bilirubin: 0.4 mg/dL (ref 0.3–1.2)
Total Protein: 6.5 g/dL (ref 6.5–8.1)

## 2021-08-16 LAB — LACTATE DEHYDROGENASE: LDH: 205 U/L — ABNORMAL HIGH (ref 98–192)

## 2021-08-20 ENCOUNTER — Inpatient Hospital Stay (HOSPITAL_COMMUNITY): Payer: Medicare PPO

## 2021-08-20 ENCOUNTER — Other Ambulatory Visit: Payer: Self-pay

## 2021-08-20 VITALS — BP 140/83 | HR 74 | Temp 97.7°F | Resp 18 | Wt 113.6 lb

## 2021-08-20 DIAGNOSIS — K5900 Constipation, unspecified: Secondary | ICD-10-CM | POA: Diagnosis not present

## 2021-08-20 DIAGNOSIS — C9000 Multiple myeloma not having achieved remission: Secondary | ICD-10-CM | POA: Diagnosis not present

## 2021-08-20 DIAGNOSIS — R197 Diarrhea, unspecified: Secondary | ICD-10-CM | POA: Diagnosis not present

## 2021-08-20 DIAGNOSIS — E1122 Type 2 diabetes mellitus with diabetic chronic kidney disease: Secondary | ICD-10-CM | POA: Diagnosis not present

## 2021-08-20 DIAGNOSIS — I129 Hypertensive chronic kidney disease with stage 1 through stage 4 chronic kidney disease, or unspecified chronic kidney disease: Secondary | ICD-10-CM | POA: Diagnosis not present

## 2021-08-20 DIAGNOSIS — Z8572 Personal history of non-Hodgkin lymphomas: Secondary | ICD-10-CM | POA: Diagnosis not present

## 2021-08-20 DIAGNOSIS — Z5111 Encounter for antineoplastic chemotherapy: Secondary | ICD-10-CM | POA: Diagnosis not present

## 2021-08-20 DIAGNOSIS — D649 Anemia, unspecified: Secondary | ICD-10-CM | POA: Diagnosis not present

## 2021-08-20 DIAGNOSIS — F0390 Unspecified dementia without behavioral disturbance: Secondary | ICD-10-CM | POA: Diagnosis not present

## 2021-08-20 LAB — CBC WITH DIFFERENTIAL/PLATELET
Abs Immature Granulocytes: 0.02 10*3/uL (ref 0.00–0.07)
Basophils Absolute: 0 10*3/uL (ref 0.0–0.1)
Basophils Relative: 1 %
Eosinophils Absolute: 0.2 10*3/uL (ref 0.0–0.5)
Eosinophils Relative: 3 %
HCT: 34.1 % — ABNORMAL LOW (ref 36.0–46.0)
Hemoglobin: 10.9 g/dL — ABNORMAL LOW (ref 12.0–15.0)
Immature Granulocytes: 0 %
Lymphocytes Relative: 15 %
Lymphs Abs: 0.8 10*3/uL (ref 0.7–4.0)
MCH: 31.9 pg (ref 26.0–34.0)
MCHC: 32 g/dL (ref 30.0–36.0)
MCV: 99.7 fL (ref 80.0–100.0)
Monocytes Absolute: 0.5 10*3/uL (ref 0.1–1.0)
Monocytes Relative: 10 %
Neutro Abs: 3.8 10*3/uL (ref 1.7–7.7)
Neutrophils Relative %: 71 %
Platelets: 199 10*3/uL (ref 150–400)
RBC: 3.42 MIL/uL — ABNORMAL LOW (ref 3.87–5.11)
RDW: 17.2 % — ABNORMAL HIGH (ref 11.5–15.5)
WBC: 5.3 10*3/uL (ref 4.0–10.5)
nRBC: 0 % (ref 0.0–0.2)

## 2021-08-20 LAB — COMPREHENSIVE METABOLIC PANEL
ALT: 22 U/L (ref 0–44)
AST: 27 U/L (ref 15–41)
Albumin: 2.9 g/dL — ABNORMAL LOW (ref 3.5–5.0)
Alkaline Phosphatase: 98 U/L (ref 38–126)
Anion gap: 5 (ref 5–15)
BUN: 16 mg/dL (ref 8–23)
CO2: 26 mmol/L (ref 22–32)
Calcium: 8.7 mg/dL — ABNORMAL LOW (ref 8.9–10.3)
Chloride: 108 mmol/L (ref 98–111)
Creatinine, Ser: 1.12 mg/dL — ABNORMAL HIGH (ref 0.44–1.00)
GFR, Estimated: 51 mL/min — ABNORMAL LOW (ref >60)
Glucose, Bld: 131 mg/dL — ABNORMAL HIGH (ref 70–99)
Potassium: 4 mmol/L (ref 3.5–5.1)
Sodium: 139 mmol/L (ref 135–145)
Total Bilirubin: 0.3 mg/dL (ref 0.3–1.2)
Total Protein: 6.7 g/dL (ref 6.5–8.1)

## 2021-08-20 LAB — KAPPA/LAMBDA LIGHT CHAINS
Kappa free light chain: 38.3 mg/L — ABNORMAL HIGH (ref 3.3–19.4)
Kappa, lambda light chain ratio: 1.04 (ref 0.26–1.65)
Lambda free light chains: 36.9 mg/L — ABNORMAL HIGH (ref 5.7–26.3)

## 2021-08-20 MED ORDER — BORTEZOMIB CHEMO SQ INJECTION 3.5 MG (2.5MG/ML)
1.3000 mg/m2 | Freq: Once | INTRAMUSCULAR | Status: AC
  Administered 2021-08-20: 2 mg via SUBCUTANEOUS
  Filled 2021-08-20: qty 0.8

## 2021-08-20 MED ORDER — DEXAMETHASONE 4 MG PO TABS
10.0000 mg | ORAL_TABLET | Freq: Once | ORAL | Status: AC
  Administered 2021-08-20: 10 mg via ORAL
  Filled 2021-08-20: qty 3

## 2021-08-20 NOTE — Progress Notes (Signed)
Patient presents today for Velcade injection per providers order.  Vital signs and labs within parameters for treatment.  Patient has no new complaints at this time. Velcade administration without incident; injection site WNL; see MAR for injection details.  Patient tolerated procedure well and without incident.  No questions or complaints noted at this time.   Discharge from clinic via wheelchair in stable condition.  Alert and oriented X 3.  Follow up with [facility] as scheduled.

## 2021-08-20 NOTE — Patient Instructions (Signed)
[place]  Discharge Instructions: Thank you for choosing [facility] to provide your oncology and hematology care.  If you have a lab appointment with the [facility], please come in thru the Main Entrance and check in at the main information desk.  Wear comfortable clothing and clothing appropriate for easy access to any Portacath or PICC line.   We strive to give you quality time with your provider. You may need to reschedule your appointment if you arrive late (15 or more minutes).  Arriving late affects you and other patients whose appointments are after yours.  Also, if you miss three or more appointments without notifying the office, you may be dismissed from the clinic at the provider's discretion.      For prescription refill requests, have your pharmacy contact our office and allow 72 hours for refills to be completed.    Today you received the following chemotherapy and/or immunotherapy agents Velcade      To help prevent nausea and vomiting after your treatment, we encourage you to take your nausea medication as directed.  BELOW ARE SYMPTOMS THAT SHOULD BE REPORTED IMMEDIATELY: *FEVER GREATER THAN 100.4 F (38 C) OR HIGHER *CHILLS OR SWEATING *NAUSEA AND VOMITING THAT IS NOT CONTROLLED WITH YOUR NAUSEA MEDICATION *UNUSUAL SHORTNESS OF BREATH *UNUSUAL BRUISING OR BLEEDING *URINARY PROBLEMS (pain or burning when urinating, or frequent urination) *BOWEL PROBLEMS (unusual diarrhea, constipation, pain near the anus) TENDERNESS IN MOUTH AND THROAT WITH OR WITHOUT PRESENCE OF ULCERS (sore throat, sores in mouth, or a toothache) UNUSUAL RASH, SWELLING OR PAIN  UNUSUAL VAGINAL DISCHARGE OR ITCHING   Items with * indicate a potential emergency and should be followed up as soon as possible or go to the Emergency Department if any problems should occur.  Please show the CHEMOTHERAPY ALERT CARD or IMMUNOTHERAPY ALERT CARD at check-in to the Emergency  Department and triage nurse.  Should you have questions after your visit or need to cancel or reschedule your appointment, please contact [facility] [phone]  and follow the prompts.  Office hours are 8:00 a.m. to 4:30 p.m. Monday - Friday. Please note that voicemails left after 4:00 p.m. may not be returned until the following business day.  We are closed weekends and major holidays. You have access to a nurse at all times for urgent questions. Please call the main number to the clinic [phone] and follow the prompts.  For any non-urgent questions, you may also contact your provider using MyChart. We now offer e-Visits for anyone 57 and older to request care online for non-urgent symptoms. For details visit [email or web address].   Also download the MyChart app! Go to the app store, search "MyChart", open the app, select [facility], and log in with your MyChart username and password.  Due to Covid, a mask is required upon entering the hospital/clinic. If you do not have a mask, one will be given to you upon arrival. For doctor visits, patients may have 1 support person aged 72 or older with them. For treatment visits, patients cannot have anyone with them due to current Covid guidelines and our immunocompromised population.

## 2021-08-21 LAB — PROTEIN ELECTROPHORESIS, SERUM
A/G Ratio: 0.8 (ref 0.7–1.7)
Albumin ELP: 2.6 g/dL — ABNORMAL LOW (ref 2.9–4.4)
Alpha-1-Globulin: 0.4 g/dL (ref 0.0–0.4)
Alpha-2-Globulin: 1 g/dL (ref 0.4–1.0)
Beta Globulin: 0.8 g/dL (ref 0.7–1.3)
Gamma Globulin: 1.1 g/dL (ref 0.4–1.8)
Globulin, Total: 3.3 g/dL (ref 2.2–3.9)
M-Spike, %: 0.6 g/dL — ABNORMAL HIGH
Total Protein ELP: 5.9 g/dL — ABNORMAL LOW (ref 6.0–8.5)

## 2021-08-21 NOTE — Progress Notes (Signed)
This encounter was created in error - please disregard.

## 2021-08-22 ENCOUNTER — Other Ambulatory Visit (HOSPITAL_COMMUNITY): Payer: Self-pay | Admitting: *Deleted

## 2021-08-26 ENCOUNTER — Inpatient Hospital Stay (HOSPITAL_COMMUNITY): Payer: Medicare PPO

## 2021-08-26 DIAGNOSIS — Z8572 Personal history of non-Hodgkin lymphomas: Secondary | ICD-10-CM | POA: Diagnosis not present

## 2021-08-26 DIAGNOSIS — I129 Hypertensive chronic kidney disease with stage 1 through stage 4 chronic kidney disease, or unspecified chronic kidney disease: Secondary | ICD-10-CM | POA: Diagnosis not present

## 2021-08-26 DIAGNOSIS — C9000 Multiple myeloma not having achieved remission: Secondary | ICD-10-CM | POA: Diagnosis not present

## 2021-08-26 DIAGNOSIS — K5900 Constipation, unspecified: Secondary | ICD-10-CM | POA: Diagnosis not present

## 2021-08-26 DIAGNOSIS — R197 Diarrhea, unspecified: Secondary | ICD-10-CM | POA: Diagnosis not present

## 2021-08-26 DIAGNOSIS — Z5111 Encounter for antineoplastic chemotherapy: Secondary | ICD-10-CM | POA: Diagnosis not present

## 2021-08-26 DIAGNOSIS — D649 Anemia, unspecified: Secondary | ICD-10-CM | POA: Diagnosis not present

## 2021-08-26 DIAGNOSIS — F0390 Unspecified dementia without behavioral disturbance: Secondary | ICD-10-CM | POA: Diagnosis not present

## 2021-08-26 DIAGNOSIS — E1122 Type 2 diabetes mellitus with diabetic chronic kidney disease: Secondary | ICD-10-CM | POA: Diagnosis not present

## 2021-08-26 LAB — CBC WITH DIFFERENTIAL/PLATELET
Abs Immature Granulocytes: 0.01 10*3/uL (ref 0.00–0.07)
Basophils Absolute: 0 10*3/uL (ref 0.0–0.1)
Basophils Relative: 0 %
Eosinophils Absolute: 0.2 10*3/uL (ref 0.0–0.5)
Eosinophils Relative: 2 %
HCT: 35.3 % — ABNORMAL LOW (ref 36.0–46.0)
Hemoglobin: 11.1 g/dL — ABNORMAL LOW (ref 12.0–15.0)
Immature Granulocytes: 0 %
Lymphocytes Relative: 13 %
Lymphs Abs: 0.8 10*3/uL (ref 0.7–4.0)
MCH: 31.2 pg (ref 26.0–34.0)
MCHC: 31.4 g/dL (ref 30.0–36.0)
MCV: 99.2 fL (ref 80.0–100.0)
Monocytes Absolute: 0.6 10*3/uL (ref 0.1–1.0)
Monocytes Relative: 9 %
Neutro Abs: 4.7 10*3/uL (ref 1.7–7.7)
Neutrophils Relative %: 76 %
Platelets: 165 10*3/uL (ref 150–400)
RBC: 3.56 MIL/uL — ABNORMAL LOW (ref 3.87–5.11)
RDW: 17.5 % — ABNORMAL HIGH (ref 11.5–15.5)
WBC: 6.3 10*3/uL (ref 4.0–10.5)
nRBC: 0 % (ref 0.0–0.2)

## 2021-08-26 LAB — COMPREHENSIVE METABOLIC PANEL
ALT: 15 U/L (ref 0–44)
AST: 23 U/L (ref 15–41)
Albumin: 3.1 g/dL — ABNORMAL LOW (ref 3.5–5.0)
Alkaline Phosphatase: 81 U/L (ref 38–126)
Anion gap: 7 (ref 5–15)
BUN: 16 mg/dL (ref 8–23)
CO2: 28 mmol/L (ref 22–32)
Calcium: 9.1 mg/dL (ref 8.9–10.3)
Chloride: 106 mmol/L (ref 98–111)
Creatinine, Ser: 1.02 mg/dL — ABNORMAL HIGH (ref 0.44–1.00)
GFR, Estimated: 57 mL/min — ABNORMAL LOW (ref >60)
Glucose, Bld: 117 mg/dL — ABNORMAL HIGH (ref 70–99)
Potassium: 4 mmol/L (ref 3.5–5.1)
Sodium: 141 mmol/L (ref 135–145)
Total Bilirubin: 0.4 mg/dL (ref 0.3–1.2)
Total Protein: 6.7 g/dL (ref 6.5–8.1)

## 2021-08-27 ENCOUNTER — Encounter (HOSPITAL_COMMUNITY): Payer: Self-pay

## 2021-08-27 ENCOUNTER — Inpatient Hospital Stay (HOSPITAL_COMMUNITY): Payer: Medicare PPO

## 2021-08-27 VITALS — BP 147/78 | HR 67 | Temp 98.1°F | Resp 16

## 2021-08-27 DIAGNOSIS — F0390 Unspecified dementia without behavioral disturbance: Secondary | ICD-10-CM | POA: Diagnosis not present

## 2021-08-27 DIAGNOSIS — Z8572 Personal history of non-Hodgkin lymphomas: Secondary | ICD-10-CM | POA: Diagnosis not present

## 2021-08-27 DIAGNOSIS — I129 Hypertensive chronic kidney disease with stage 1 through stage 4 chronic kidney disease, or unspecified chronic kidney disease: Secondary | ICD-10-CM | POA: Diagnosis not present

## 2021-08-27 DIAGNOSIS — C9000 Multiple myeloma not having achieved remission: Secondary | ICD-10-CM

## 2021-08-27 DIAGNOSIS — D649 Anemia, unspecified: Secondary | ICD-10-CM | POA: Diagnosis not present

## 2021-08-27 DIAGNOSIS — Z5111 Encounter for antineoplastic chemotherapy: Secondary | ICD-10-CM | POA: Diagnosis not present

## 2021-08-27 DIAGNOSIS — K5900 Constipation, unspecified: Secondary | ICD-10-CM | POA: Diagnosis not present

## 2021-08-27 DIAGNOSIS — R197 Diarrhea, unspecified: Secondary | ICD-10-CM | POA: Diagnosis not present

## 2021-08-27 DIAGNOSIS — E1122 Type 2 diabetes mellitus with diabetic chronic kidney disease: Secondary | ICD-10-CM | POA: Diagnosis not present

## 2021-08-27 MED ORDER — BORTEZOMIB CHEMO SQ INJECTION 3.5 MG (2.5MG/ML)
1.3000 mg/m2 | Freq: Once | INTRAMUSCULAR | Status: AC
  Administered 2021-08-27: 2 mg via SUBCUTANEOUS
  Filled 2021-08-27: qty 0.8

## 2021-08-27 MED ORDER — DEXAMETHASONE 4 MG PO TABS
10.0000 mg | ORAL_TABLET | Freq: Once | ORAL | Status: AC
  Administered 2021-08-27: 10 mg via ORAL
  Filled 2021-08-27: qty 3

## 2021-08-27 NOTE — Progress Notes (Signed)
Patient presents today for Velcade injection. Labs within parameters for treatment and drawn on 08/26/21.    Treatment given today per MD orders. Tolerated without adverse affects. Vital signs stable. No complaints at this time. Discharged from clinic by wheel chair and accompanied by personnel from [facility] in stable condition. Alert and oriented x 3. F/U with [facility] as scheduled.

## 2021-08-27 NOTE — Patient Instructions (Signed)
[facility]  Discharge Instructions: Thank you for choosing [facility] to provide your oncology and hematology care.  If you have a lab appointment with the [facility], please come in thru the Main Entrance and check in at the main information desk.  Wear comfortable clothing and clothing appropriate for easy access to any Portacath or PICC line.   We strive to give you quality time with your provider. You may need to reschedule your appointment if you arrive late (15 or more minutes).  Arriving late affects you and other patients whose appointments are after yours.  Also, if you miss three or more appointments without notifying the office, you may be dismissed from the clinic at the provider's discretion.      For prescription refill requests, have your pharmacy contact our office and allow 72 hours for refills to be completed.    Today you received the following chemotherapy : Velcade injection.      To help prevent nausea and vomiting after your treatment, we encourage you to take your nausea medication as directed.  BELOW ARE SYMPTOMS THAT SHOULD BE REPORTED IMMEDIATELY: *FEVER GREATER THAN 100.4 F (38 C) OR HIGHER *CHILLS OR SWEATING *NAUSEA AND VOMITING THAT IS NOT CONTROLLED WITH YOUR NAUSEA MEDICATION *UNUSUAL SHORTNESS OF BREATH *UNUSUAL BRUISING OR BLEEDING *URINARY PROBLEMS (pain or burning when urinating, or frequent urination) *BOWEL PROBLEMS (unusual diarrhea, constipation, pain near the anus) TENDERNESS IN MOUTH AND THROAT WITH OR WITHOUT PRESENCE OF ULCERS (sore throat, sores in mouth, or a toothache) UNUSUAL RASH, SWELLING OR PAIN  UNUSUAL VAGINAL DISCHARGE OR ITCHING   Items with * indicate a potential emergency and should be followed up as soon as possible or go to the Emergency Department if any problems should occur.  Please show the CHEMOTHERAPY ALERT CARD or IMMUNOTHERAPY ALERT CARD at check-in to the Emergency Department and triage  nurse.  Should you have questions after your visit or need to cancel or reschedule your appointment, please contact [facility] [phone]  and follow the prompts.  Office hours are 8:00 a.m. to 4:30 p.m. Monday - Friday. Please note that voicemails left after 4:00 p.m. may not be returned until the following business day.  We are closed weekends and major holidays. You have access to a nurse at all times for urgent questions. Please call the main number to the clinic [phone] and follow the prompts.  For any non-urgent questions, you may also contact your provider using MyChart. We now offer e-Visits for anyone 89 and older to request care online for non-urgent symptoms. For details visit [email or web address].   Also download the MyChart app! Go to the app store, search "MyChart", open the app, select [facility], and log in with your MyChart username and password.  Due to Covid, a mask is required upon entering the hospital/clinic. If you do not have a mask, one will be given to you upon arrival. For doctor visits, patients may have 1 support person aged 67 or older with them. For treatment visits, patients cannot have anyone with them due to current Covid guidelines and our immunocompromised population.

## 2021-09-03 ENCOUNTER — Non-Acute Institutional Stay (SKILLED_NURSING_FACILITY): Payer: Medicare PPO | Admitting: Adult Health

## 2021-09-03 ENCOUNTER — Encounter: Payer: Self-pay | Admitting: Adult Health

## 2021-09-03 DIAGNOSIS — E1159 Type 2 diabetes mellitus with other circulatory complications: Secondary | ICD-10-CM

## 2021-09-03 DIAGNOSIS — I152 Hypertension secondary to endocrine disorders: Secondary | ICD-10-CM | POA: Diagnosis not present

## 2021-09-03 DIAGNOSIS — R627 Adult failure to thrive: Secondary | ICD-10-CM | POA: Diagnosis not present

## 2021-09-03 DIAGNOSIS — E039 Hypothyroidism, unspecified: Secondary | ICD-10-CM

## 2021-09-03 DIAGNOSIS — E876 Hypokalemia: Secondary | ICD-10-CM | POA: Diagnosis not present

## 2021-09-03 NOTE — Progress Notes (Signed)
Location:  [place] Room Number: 148-W Place of Service:  SNF (31)   CODE STATUS: DNR  Allergies  Allergen Reactions   Lotensin [Benazepril]     Chief Complaint  Patient presents with   Medical Management of Chronic Issues           Failure to thrive in adult:  Hypokalemia:  Hypothyroidism, unspecified type:  Hypertension associated with type 2 diabetes mellitus    HPI:  She is a 75 year old long term resident of this facility being seen for the management of her chronic illnesses: Failure to thrive in adult:  Hypokalemia:  Hypothyroidism, unspecified type:  Hypertension associated with type 2 diabetes mellitus.  There are no reports of uncontrolled pain; no reports of changes in appetite; no reports of insomnia.   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[place])    Dementia ([place])    Diabetes mellitus without complication ([place])    H/O partial nephrectomy    Hypokalemia    Hypothyroidism    Impaired cognition    Multiple myeloma (HCC)    Dr Maylon Peppers, [facility]   Thyroid disease     Past Surgical History:  Procedure Laterality Date   ABDOMINAL HYSTERECTOMY     CRANIOTOMY     for lymphoma   YAG LASER APPLICATION Right 03/12/9241   Procedure: YAG LASER APPLICATION;  Surgeon: [name], MD;  Location: [facility];  Service: Ophthalmology;  Laterality: Right;    Social History   Socioeconomic History   Marital status: Single    Spouse name: Not on file   Number of children: Not on file   Years of education: Not on file   Highest education level: Not on file  Occupational History   Occupation: retired   Tobacco Use   Smoking status: Never   Smokeless tobacco: Never  Vaping Use   Vaping Use: Never used  Substance and Sexual Activity   Alcohol use: No   Drug use: No   Sexual activity: Never  Other Topics Concern   Not on file  Social History Narrative   Long term resident  of [facility]    Social Determinants of Health   Financial Resource Strain: Low Risk    Difficulty of Paying Living Expenses: Not very hard  Food Insecurity: No Food Insecurity   Worried About Charity fundraiser in the Last Year: Never true   [place] in the Last Year: Never true  Transportation Needs: No Transportation Needs   Lack of Transportation (Medical): No   Lack of Transportation (Non-Medical): No  Physical Activity: Inactive   Days of Exercise per Week: 0 days   Minutes of Exercise per Session: 0 min  Stress: No Stress Concern Present   Feeling of Stress : Not at all  Social Connections: Moderately Isolated   Frequency of Communication with Friends and Family: More than three times a week   Frequency of Social Gatherings with Friends and Family: Once a week   Attends Religious Services: More than 4 times per year   Active Member of [employer] or Organizations: No   Attends Archivist Meetings: Never   Marital Status: Never married  Human resources officer Violence: Not on file   Family History  Problem Relation Age of Onset   Depression Mother    Diabetes Mother    Heart disease Father    Cancer -  Prostate Brother    Cancer Brother    Cancer Sister       VITAL SIGNS BP 119/62   Pulse 64   Temp 98.2 F (36.8 C)   Resp 20   Ht '5\' 9"'  (1.753 m)   Wt 115 lb 12.8 oz (52.5 kg)   SpO2 97%   BMI 17.10 kg/m   Facility-Administered Encounter Medications as of 09/03/2021  Medication   prochlorperazine (COMPAZINE) tablet 10 mg   Outpatient Encounter Medications as of 09/03/2021  Medication Sig   acetaminophen (TYLENOL) 500 MG tablet Take 1,000 mg by mouth every 6 (six) hours as needed for mild pain.   acyclovir (ZOVIRAX) 400 MG tablet TAKE 1 TABLET BY MOUTH TWICE DAILY   aspirin EC 81 MG tablet Take 81 mg by mouth daily.   busPIRone (BUSPAR) 5 MG tablet Take 5 mg by mouth 2 (two) times daily. For anxiety   Calcium Carbonate-Vitamin D (CALCIUM-VITAMIN D3 PO) Take  400 Units by mouth daily in the afternoon.   cholestyramine (QUESTRAN) 4 g packet Take 4 g by mouth 2 (two) times daily between meals.   Lactobacillus Probiotic TABS Take 2 tablets by mouth daily.   levothyroxine (SYNTHROID, LEVOTHROID) 25 MCG tablet Take 25 mcg by mouth daily before breakfast.   lipase/protease/amylase (CREON) 36000 UNITS CPEP capsule Take 36,000 Units by mouth 2 (two) times daily. With snacks between meals at 10 am and 8 pm   lipase/protease/amylase (CREON) 36000 UNITS CPEP capsule Take 72,000 Units by mouth 3 (three) times daily with meals. 8 am, 12 pm, and 6 pm   loperamide (IMODIUM A-D) 2 MG tablet Take 4 mg by mouth 3 (three) times daily. For diarrhea   losartan (COZAAR) 50 MG tablet Take 1 tablet (50 mg total) by mouth daily.   Multiple Vitamins-Minerals (MULTIVITAMIN WOMEN 50+ PO) Take by mouth daily.    NON FORMULARY Regular diet no dairy products.   NON FORMULARY Wanderguard #[id number] to ankle for safety awareness. Check placement and function qshift. Special Instructions: Check placement and function qshift. Every Shift Day, Evening, Night   potassium chloride SA (KLOR-CON) 20 MEQ tablet Take 20 mEq by mouth daily.    colchicine 0.6 MG tablet Take 0.6 mg by mouth daily.     SIGNIFICANT DIAGNOSTIC EXAMS  PREVIOUS   08-10-20: t score: -3.758  12-21-20: KUBThere is a mild ileus noted. Mild to moderate increase feces in the colon. No renal stone is seen  12-25-20: KUB  There is a nonspecific bowel gas pattern without obstruction. No residual ileus is noted. Mild increased feces in the colon. No renal stone is seen.  02-05-21: ct of head:  1. No acute intracranial abnormality. Please note if concern for acute infarction MRI would be more sensitive. 2. Similar appearance of bifrontal encephalomalacia consistent with prior infarction/insult  02-05-21: MRI of brain:  No evidence of recent infarction, hemorrhage, or mass. Bifrontal encephalomalacia/gliosis. Additional  chronic microvascular ischemic changes. Questioned small left anterior clinoid process calcified meningioma on prior CT imaging is not well evaluated due to size and lack of contrast.  NO NEW EXAMS.     LABS REVIEWED PREVIOUS    08-16-20: urine micro-albumin 9.3 08-21-20: wbc 3.9; hgb 9.5; hct 29.7 mcv 104.2 plt 86; glucose 86; bun 18; creat 1.09; k+ 3.7; na++ 144; ca 8.1; liver normal albumin 3.1  09-18-20: wbc 4.4; hgb 9.6; hct 29.7 mcv 102.4 plt 105; glucose 99; bun 20; creat 1.12; k+ 3.2; na++ 142; ca 8.8 liver normal albumin  3.2  10-16-20: wbc 3.9; hgb 10.2; hct 32.3; mcv 104.9 plt 95; glucose 98; bun 19; creat 1.25; k+ 3.9; na++ 139; ca 8.8 liver normal albumin 3.2  12-04-20: wbc 3.1; hgb 10.9; hct 34.6; mcv 103.0 plt 130; glucose 132; bun 27; creat 1.35; k+ 3.6; na++ 143; ca 9.3 liver normal albumin 3.5  12-25-20: wbc 2.7; hgb 10.1; hct 31.2 mcv 103.0 plt 120; glucose 108; bun 13; creat 1.53; k+ 3.9; na++ 138; ca 8.7 liver normal albumin 3.2 GFR 3 01-01-21: wbc 2.5; hgb 10.6; hct 32.1; mcv 100.6 plt 87; glucose 87; bun 27; creat 1.24; k+ 3.8; na++ 144; ca 9.3 GFR 45; liver normal albumin 3.4  02-05-21: wbc 3.9; hgb 10.2; hct 31.6; mcv 102.9 plt 91; glucose 118; bun 26; creat 1.29; k+ 3.8; na++ 138; ca 8.7 GFR43 liver normal albumin 3.0 urine culture: e-coli 02-07-21: hgb a1c 5.3 04-08-21: chol 142; ldl 66; trig 101; hdl 56 04-09-21: wbc 4.6; hgb 11.3; hct 35.3; mcv 100.6 plt 140; glucose 101; bun 20; creat 1.13; k+ 3.5; na++ 139; ca 8.5 GFR 50; liver normal albumin 3.4   TODAY  06-18-21: wbc 4.5; hgb 10.6; hct 33.7; mcv 98.5 plt 174; glucose 111; bun 22; creat 1.20 ;k+ 3.7; na++ 141; ca 8.9; GFR 47; liver normal albumin 3.4 07-15-21: wbc 4.7; hgb 11.7; hct 35.9; mcv 97.0 plt 220; glucose 161; bun 21; creat 1.23; k+ 3.9; na++ 138; ca 8.8; GFR 46 liver normal albumin 3.2 uric acid 4.4 08-08-21: urine culture: 70,000 e-coli 08-26-21: wbc 6.3; hgb 11.1; hct 35.3; mcv 99.2 plt 165; glucose 117; bun 16;  creat 1.02; k+ 4.0; na++ 141; ca 9.1 GFR 57; liver normal albumin 3.1   Review of Systems  Constitutional:  Negative for malaise/fatigue.  Respiratory:  Negative for cough and shortness of breath.   Cardiovascular:  Negative for chest pain, palpitations and leg swelling.  Gastrointestinal:  Negative for abdominal pain, constipation and heartburn.  Musculoskeletal:  Negative for back pain, joint pain and myalgias.  Skin: Negative.   Neurological:  Negative for dizziness.  Psychiatric/Behavioral:  The patient is not nervous/anxious.     Physical Exam Constitutional:      General: She is not in acute distress.    Appearance: She is cachectic. She is not diaphoretic.  Neck:     Thyroid: No thyromegaly.  Cardiovascular:     Rate and Rhythm: Normal rate and regular rhythm.     Heart sounds: Normal heart sounds.  Pulmonary:     Effort: Pulmonary effort is normal. No respiratory distress.     Breath sounds: Normal breath sounds.  Abdominal:     General: Bowel sounds are normal. There is no distension.     Palpations: Abdomen is soft.     Tenderness: There is no abdominal tenderness.  Musculoskeletal:        General: Normal range of motion.     Cervical back: Neck supple.     Right lower leg: No edema.     Left lower leg: No edema.  Lymphadenopathy:     Cervical: No cervical adenopathy.  Skin:    General: Skin is warm and dry.  Neurological:     Mental Status: She is alert. Mental status is at baseline.  Psychiatric:        Mood and Affect: Mood normal.     ASSESSMENT/ PLAN:  TODAY  Failure to thrive in adult: is stable her weight is 115 pounds; will continue to monitor  2. Hypokalemia: k+  4.0 will continue k+ 20 meq daily   3. Hypothyroidism, unspecified type: is stable tsh 2.264 will continue synthroid 25 mcg daily   4. Hypertension associated with type 2 diabetes mellitus: is stable b/p 119/62 will continue cozaar 50 mg daily asa 81 mg daily   PREVIOUS   5.  Diabetes mellitus without complication: is stable hgb a1c 5.3 will monitor is on arb statin  6. Post menopausal osteoporosis t score -3.758 will continue calcium and vitamin d  7 Compression fracture of L1 vertebrae with routine healing subsequent encounter: (06/2017) is stable has prn tylenol   8. Thrombocytopenia: is stable plt 165 will monitor   9. Multiple myeloma without achieving remission: is without change: is followed by oncology  10. Aortic atherosclerosis: (ct 08-26-15) will  Monitor   11. First degree av block/bradycardia: is stable has been seen by cardiology will monitor    12. Pancreatic insufficiency: is stable will continue questran 4 gm twice daily; creon 72,000 units with meals and imodium 2 mg three times daily   13. Macrocytic anemia: stable; hgb 11.1; vit B 12: 620; is off iron (due to diarrhea) will monitor   13. Vascular dementia with behavioral disturbance: is stable weight is 115 pounds; will continue buspar 5 mg daily to help with anxiety. Is off aricept   15. Herpes: no recent outbreaks will continue acyclovir 400 mg twice daily      Ok Edwards NP [facility]  Contact [phone] Monday through Friday 8am- 5pm  After hours call [phone]

## 2021-09-05 ENCOUNTER — Encounter (HOSPITAL_COMMUNITY)
Admission: RE | Admit: 2021-09-05 | Discharge: 2021-09-05 | Disposition: A | Discharge status: Home / Self Care | Payer: Medicare PPO | Source: Skilled Nursing Facility | Attending: Adult Health | Admitting: Adult Health

## 2021-09-05 DIAGNOSIS — E119 Type 2 diabetes mellitus without complications: Secondary | ICD-10-CM | POA: Insufficient documentation

## 2021-09-06 LAB — HEMOGLOBIN A1C
Hgb A1c MFr Bld: 5.9 % — ABNORMAL HIGH (ref 4.8–5.6)
Mean Plasma Glucose: 123 mg/dL

## 2021-09-06 LAB — MICROALBUMIN / CREATININE URINE RATIO
Creatinine, Urine: 180.2 mg/dL
Microalb Creat Ratio: 4 mg/g creat (ref 0–29)
Microalb, Ur: 7.9 ug/mL — ABNORMAL HIGH

## 2021-09-09 ENCOUNTER — Inpatient Hospital Stay (HOSPITAL_COMMUNITY): Payer: Medicare PPO

## 2021-09-09 ENCOUNTER — Other Ambulatory Visit: Payer: Self-pay

## 2021-09-09 DIAGNOSIS — R197 Diarrhea, unspecified: Secondary | ICD-10-CM | POA: Diagnosis not present

## 2021-09-09 DIAGNOSIS — C9000 Multiple myeloma not having achieved remission: Secondary | ICD-10-CM

## 2021-09-09 DIAGNOSIS — K5900 Constipation, unspecified: Secondary | ICD-10-CM | POA: Diagnosis not present

## 2021-09-09 DIAGNOSIS — D649 Anemia, unspecified: Secondary | ICD-10-CM | POA: Diagnosis not present

## 2021-09-09 DIAGNOSIS — Z5111 Encounter for antineoplastic chemotherapy: Secondary | ICD-10-CM | POA: Diagnosis not present

## 2021-09-09 DIAGNOSIS — Z8572 Personal history of non-Hodgkin lymphomas: Secondary | ICD-10-CM | POA: Diagnosis not present

## 2021-09-09 DIAGNOSIS — F0390 Unspecified dementia without behavioral disturbance: Secondary | ICD-10-CM | POA: Diagnosis not present

## 2021-09-09 DIAGNOSIS — E1122 Type 2 diabetes mellitus with diabetic chronic kidney disease: Secondary | ICD-10-CM | POA: Diagnosis not present

## 2021-09-09 DIAGNOSIS — I129 Hypertensive chronic kidney disease with stage 1 through stage 4 chronic kidney disease, or unspecified chronic kidney disease: Secondary | ICD-10-CM | POA: Diagnosis not present

## 2021-09-09 LAB — CBC WITH DIFFERENTIAL/PLATELET
Abs Immature Granulocytes: 0.01 10*3/uL (ref 0.00–0.07)
Basophils Absolute: 0 10*3/uL (ref 0.0–0.1)
Basophils Relative: 1 %
Eosinophils Absolute: 0.1 10*3/uL (ref 0.0–0.5)
Eosinophils Relative: 3 %
HCT: 32.1 % — ABNORMAL LOW (ref 36.0–46.0)
Hemoglobin: 10.4 g/dL — ABNORMAL LOW (ref 12.0–15.0)
Immature Granulocytes: 0 %
Lymphocytes Relative: 13 %
Lymphs Abs: 0.6 10*3/uL — ABNORMAL LOW (ref 0.7–4.0)
MCH: 32.6 pg (ref 26.0–34.0)
MCHC: 32.4 g/dL (ref 30.0–36.0)
MCV: 100.6 fL — ABNORMAL HIGH (ref 80.0–100.0)
Monocytes Absolute: 0.4 10*3/uL (ref 0.1–1.0)
Monocytes Relative: 9 %
Neutro Abs: 3.5 10*3/uL (ref 1.7–7.7)
Neutrophils Relative %: 74 %
Platelets: 191 10*3/uL (ref 150–400)
RBC: 3.19 MIL/uL — ABNORMAL LOW (ref 3.87–5.11)
RDW: 17.5 % — ABNORMAL HIGH (ref 11.5–15.5)
WBC: 4.7 10*3/uL (ref 4.0–10.5)
nRBC: 0 % (ref 0.0–0.2)

## 2021-09-09 LAB — COMPREHENSIVE METABOLIC PANEL
ALT: 18 U/L (ref 0–44)
AST: 26 U/L (ref 15–41)
Albumin: 2.9 g/dL — ABNORMAL LOW (ref 3.5–5.0)
Alkaline Phosphatase: 81 U/L (ref 38–126)
Anion gap: 7 (ref 5–15)
BUN: 20 mg/dL (ref 8–23)
CO2: 27 mmol/L (ref 22–32)
Calcium: 8.8 mg/dL — ABNORMAL LOW (ref 8.9–10.3)
Chloride: 105 mmol/L (ref 98–111)
Creatinine, Ser: 1.2 mg/dL — ABNORMAL HIGH (ref 0.44–1.00)
GFR, Estimated: 47 mL/min — ABNORMAL LOW (ref >60)
Glucose, Bld: 101 mg/dL — ABNORMAL HIGH (ref 70–99)
Potassium: 3.5 mmol/L (ref 3.5–5.1)
Sodium: 139 mmol/L (ref 135–145)
Total Bilirubin: 0.3 mg/dL (ref 0.3–1.2)
Total Protein: 6.3 g/dL — ABNORMAL LOW (ref 6.5–8.1)

## 2021-09-09 NOTE — Progress Notes (Signed)
Amanda Wu, [place] [id number]   CLINIC:  Medical Oncology/Hematology  PCP:  Gerlene Fee, NP [address] [place] [address] [phone]   REASON FOR VISIT:  Follow-up for multiple myeloma  PRIOR THERAPY: none  NGS Results: not done  CURRENT THERAPY: Velcade & Decadron 3/4 weeks; Revlimid 2/4 weeks  BRIEF ONCOLOGIC HISTORY:  Oncology History  Multiple myeloma ([place])  07/09/2017 Initial Diagnosis   Multiple myeloma ([place])   06/01/2018 -  Chemotherapy    Patient is on Treatment Plan: MYELOMA MAINTENANCE BORTEZOMIB SQ D1,8,15 / DEXAMETHASONE ORAL D1,8,15 Q28D          CANCER STAGING: Cancer Staging No matching staging information was found for the patient.  INTERVAL HISTORY:  [name], a 75 y.o. female, returns for routine follow-up and consideration for next cycle of chemotherapy. Amanda Wu was last seen on 07/16/2021.  Due for cycle #43 of Velcade & Decadron today.   Overall, she tells me she has been feeling pretty well. She reports occasional diarrhea and constipation. She denies any recent fevers, infections, tingling/numbness. She has a good appetite.   Overall, she feels ready for next cycle of chemo today.   REVIEW OF SYSTEMS:  Review of Systems  Constitutional:  Negative for appetite change, fatigue (80%) and fever.  Gastrointestinal:  Positive for constipation and diarrhea.  Neurological:  Negative for numbness.  Psychiatric/Behavioral:  Positive for sleep disturbance.   All other systems reviewed and are negative.  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[place])    Dementia ([place])    Diabetes mellitus without complication ([place])    H/O partial nephrectomy    Hypokalemia    Hypothyroidism    Impaired cognition    Multiple myeloma (HCC)    Dr Maylon Peppers, [facility]   Thyroid disease    Past Surgical  History:  Procedure Laterality Date   ABDOMINAL HYSTERECTOMY     CRANIOTOMY     for lymphoma   YAG LASER APPLICATION Right 4/76/5465   Procedure: YAG LASER APPLICATION;  Surgeon: [name], MD;  Location: [facility];  Service: Ophthalmology;  Laterality: Right;    SOCIAL HISTORY:  Social History   Socioeconomic History   Marital status: Single    Spouse name: Not on file   Number of children: Not on file   Years of education: Not on file   Highest education level: Not on file  Occupational History   Occupation: retired   Tobacco Use   Smoking status: Never   Smokeless tobacco: Never  Vaping Use   Vaping Use: Never used  Substance and Sexual Activity   Alcohol use: No   Drug use: No   Sexual activity: Never  Other Topics Concern   Not on file  Social History Narrative   Long term resident of [facility]    Social Determinants of Health   Financial Resource Strain: Low Risk    Difficulty of Paying Living Expenses: Not very hard  Food Insecurity: No Food Insecurity   Worried About Charity fundraiser in the Last Year: Never true   [place] in the Last Year: Never true  Transportation Needs: No Transportation Needs   Lack of Transportation (Medical): No   Lack of Transportation (Non-Medical): No  Physical Activity: Inactive   Days of Exercise per Week: 0 days  Minutes of Exercise per Session: 0 min  Stress: No Stress Concern Present   Feeling of Stress : Not at all  Social Connections: Moderately Isolated   Frequency of Communication with Friends and Family: More than three times a week   Frequency of Social Gatherings with Friends and Family: Once a week   Attends Religious Services: More than 4 times per year   Active Member of [employer] or Organizations: No   Attends Archivist Meetings: Never   Marital Status: Never married  Human resources officer Violence: Not on file    FAMILY HISTORY:  Family History  Problem Relation Age of Onset   Depression  Mother    Diabetes Mother    Heart disease Father    Cancer - Prostate Brother    Cancer Brother    Cancer Sister     CURRENT MEDICATIONS:  No current facility-administered medications for this visit.   No current outpatient medications on file.   Facility-Administered Medications Ordered in Other Visits  Medication Dose Route Frequency Provider Last Rate Last Admin   prochlorperazine (COMPAZINE) tablet 10 mg  10 mg Oral Once Derek Jack, MD        ALLERGIES:  Allergies  Allergen Reactions   Lotensin [Benazepril]     PHYSICAL EXAM:  Performance status (ECOG): 2 - Symptomatic, <50% confined to bed  There were no vitals filed for this visit. Wt Readings from Last 3 Encounters:  09/03/21 115 lb 12.8 oz (52.5 kg)  08/20/21 113 lb 9.6 oz (51.5 kg)  08/15/21 117 lb 6.4 oz (53.3 kg)   Physical Exam Vitals reviewed.  Constitutional:      Appearance: Normal appearance.  Cardiovascular:     Rate and Rhythm: Normal rate and regular rhythm.     Pulses: Normal pulses.     Heart sounds: Normal heart sounds.  Pulmonary:     Effort: Pulmonary effort is normal.     Breath sounds: Normal breath sounds.  Neurological:     General: No focal deficit present.     Mental Status: She is alert and oriented to person, place, and time.  Psychiatric:        Mood and Affect: Mood normal.        Behavior: Behavior normal.    LABORATORY DATA:  I have reviewed the labs as listed.  CBC Latest Ref Rng & Units 09/09/2021 08/26/2021 08/20/2021  WBC 4.0 - 10.5 K/uL 4.7 6.3 5.3  Hemoglobin 12.0 - 15.0 g/dL 10.4(L) 11.1(L) 10.9(L)  Hematocrit 36.0 - 46.0 % 32.1(L) 35.3(L) 34.1(L)  Platelets 150 - 400 K/uL 191 165 199   CMP Latest Ref Rng & Units 09/09/2021 08/26/2021 08/20/2021  Glucose 70 - 99 mg/dL 101(H) 117(H) 131(H)  BUN 8 - 23 mg/dL _0 Creatinine 0.44 - 1.00 mg/dL 1.20(H) 1.02(H) 1.12(H)  Sodium 135 - 145 mmol/L 139 141 139  Potassium 3.5 - 5.1 mmol/L 3.5 4.0 4.0  Chloride 98  - 111 mmol/L 105 106 108  CO2 22 - 32 mmol/L _1 Calcium 8.9 - 10.3 mg/dL 8.8(L) 9.1 8.7(L)  Total Protein 6.5 - 8.1 g/dL 6.3(L) 6.7 6.7  Total Bilirubin 0.3 - 1.2 mg/dL 0.3 0.4 0.3  Alkaline Phos 38 - 126 U/L 81 81 98  AST 15 - 41 U/L _2 ALT 0 - 44 U/L _3 DIAGNOSTIC IMAGING:  I have independently reviewed the scans and discussed with the patient. No results found.  ASSESSMENT:  1.  IgG lambda multiple myeloma: -She is on Revlimid 10 mg 2 weeks on/2 weeks of along with Velcade 3 weeks on/1 week off.  Dexamethasone 10 mg on days of Velcade. -Myeloma panel on 04/17/2020 shows M spike 0.5 g.  Kappa light chains are 44.  Lambda light chains are 34.  Ratio is 1.3. -Resident of [facility] since 07/19/2020.   2.  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   PLAN:  1.  IgG lambda multiple myeloma: - She is continuing to tolerate Velcade on days 1, 8, 15 every 21 days along with dexamethasone 10 mg on injection days. - Reviewed myeloma panel from 08/16/2021.  M spike is stable at 0.6 g over the last few months.  Free light chain ratio is normal at 1.04 and kappa light chains 38.3. - She does not report any neuropathy symptoms.  Reviewed labs from today which showed normal LFTs.  White count and platelets are grossly normal. - Continue current treatment.  RTC 10 weeks for follow-up with repeat labs.   2.  CKD: - Creatinine today is 1.20.  Baseline creatinine stable between 1.2-1.3.   3.  Weight loss: - Weight has been stable in the last month.   4.  Dementia: - Continue Aricept 10 mg daily.   5.  Shingles prophylaxis: - Continue acyclovir 400 mg twice daily.   6.  Myeloma bone disease: - She was treated with Zometa for several years which was discontinued.   7.  Normocytic anemia: - This is from myelosuppression from treatments and CKD.  Hemoglobin today is 10.4.   8.  Diarrhea: - She has mild diarrhea on and off, associated  with constipation.  Diarrhea overall improved since Revlimid discontinued.   Orders placed this encounter:  No orders of the defined types were placed in this encounter.    Derek Jack, MD [place] [phone]   I, Thana Ates, am acting as a scribe for Dr. Derek Jack.  I, Derek Jack MD, have reviewed the above documentation for accuracy and completeness, and I agree with the above.

## 2021-09-10 ENCOUNTER — Inpatient Hospital Stay (HOSPITAL_BASED_OUTPATIENT_CLINIC_OR_DEPARTMENT_OTHER): Payer: Medicare PPO | Admitting: Hematology

## 2021-09-10 ENCOUNTER — Inpatient Hospital Stay (HOSPITAL_COMMUNITY): Payer: Medicare PPO

## 2021-09-10 VITALS — BP 133/84 | HR 74 | Temp 97.8°F | Resp 18 | Wt 115.7 lb

## 2021-09-10 DIAGNOSIS — Z8572 Personal history of non-Hodgkin lymphomas: Secondary | ICD-10-CM | POA: Diagnosis not present

## 2021-09-10 DIAGNOSIS — F0390 Unspecified dementia without behavioral disturbance: Secondary | ICD-10-CM | POA: Diagnosis not present

## 2021-09-10 DIAGNOSIS — E1122 Type 2 diabetes mellitus with diabetic chronic kidney disease: Secondary | ICD-10-CM | POA: Diagnosis not present

## 2021-09-10 DIAGNOSIS — C9000 Multiple myeloma not having achieved remission: Secondary | ICD-10-CM | POA: Diagnosis not present

## 2021-09-10 DIAGNOSIS — I129 Hypertensive chronic kidney disease with stage 1 through stage 4 chronic kidney disease, or unspecified chronic kidney disease: Secondary | ICD-10-CM | POA: Diagnosis not present

## 2021-09-10 DIAGNOSIS — K5900 Constipation, unspecified: Secondary | ICD-10-CM | POA: Diagnosis not present

## 2021-09-10 DIAGNOSIS — D649 Anemia, unspecified: Secondary | ICD-10-CM | POA: Diagnosis not present

## 2021-09-10 DIAGNOSIS — Z5111 Encounter for antineoplastic chemotherapy: Secondary | ICD-10-CM | POA: Diagnosis not present

## 2021-09-10 DIAGNOSIS — R197 Diarrhea, unspecified: Secondary | ICD-10-CM | POA: Diagnosis not present

## 2021-09-10 MED ORDER — BORTEZOMIB CHEMO SQ INJECTION 3.5 MG (2.5MG/ML)
1.3000 mg/m2 | Freq: Once | INTRAMUSCULAR | Status: AC
  Administered 2021-09-10: 2 mg via SUBCUTANEOUS
  Filled 2021-09-10: qty 0.8

## 2021-09-10 MED ORDER — DEXAMETHASONE 4 MG PO TABS
10.0000 mg | ORAL_TABLET | Freq: Once | ORAL | Status: AC
  Administered 2021-09-10: 10 mg via ORAL
  Filled 2021-09-10: qty 3

## 2021-09-10 NOTE — Progress Notes (Signed)
Amanda Wu presents today for Velcade injection per the provider's orders.  Stable during administration without incident; injection site WNL; see MAR for injection details.  Patient tolerated procedure well and without incident.  No questions or complaints noted at this time. Discharge from clinic via wheelchair in stable condition.  Alert and oriented X 3.  Follow up with [facility] as scheduled.

## 2021-09-10 NOTE — Patient Instructions (Addendum)
[place] at [facility] Discharge Instructions  You were seen today by Dr. Delton Coombes. He went over your recent results, and you received your treatment. Dr. Delton Coombes will see you back in 10 weeks for labs and follow up.   Thank you for choosing [place] at [facility] to provide your oncology and hematology care.  To afford each patient quality time with our provider, please arrive at least 15 minutes before your scheduled appointment time.   If you have a lab appointment with the [facility] please come in thru the Main Entrance and check in at the main information desk  You need to re-schedule your appointment should you arrive 10 or more minutes late.  We strive to give you quality time with our providers, and arriving late affects you and other patients whose appointments are after yours.  Also, if you no show three or more times for appointments you may be dismissed from the clinic at the providers discretion.     Again, thank you for choosing [facility].  Our hope is that these requests will decrease the amount of time that you wait before being seen by our physicians.       _____________________________________________________________  Should you have questions after your visit to [facility], please contact our office at [phone] between the hours of 8:00 a.m. and 4:30 p.m.  Voicemails left after 4:00 p.m. will not be returned until the following business day.  For prescription refill requests, have your pharmacy contact our office and allow 72 hours.    Cancer Center Support Programs:   > Cancer Support Group  2nd Tuesday of the month 1pm-2pm, Journey Room

## 2021-09-10 NOTE — Progress Notes (Signed)
Patient has been examined, vital signs and labs have been reviewed by Dr. Katragadda. ANC, Creatinine, LFTs, hemoglobin, and platelets are within treatment parameters per Dr. Katragadda. Patient is okay to proceed with treatment per M.D.   

## 2021-09-10 NOTE — Patient Instructions (Signed)
[place]  Discharge Instructions: Thank you for choosing [facility] to provide your oncology and hematology care.  If you have a lab appointment with the [facility], please come in thru the Main Entrance and check in at the main information desk.  Wear comfortable clothing and clothing appropriate for easy access to any Portacath or PICC line.   We strive to give you quality time with your provider. You may need to reschedule your appointment if you arrive late (15 or more minutes).  Arriving late affects you and other patients whose appointments are after yours.  Also, if you miss three or more appointments without notifying the office, you may be dismissed from the clinic at the provider's discretion.      For prescription refill requests, have your pharmacy contact our office and allow 72 hours for refills to be completed.    Today you received the following chemotherapy and/or immunotherapy agents Velcade      To help prevent nausea and vomiting after your treatment, we encourage you to take your nausea medication as directed.  BELOW ARE SYMPTOMS THAT SHOULD BE REPORTED IMMEDIATELY: *FEVER GREATER THAN 100.4 F (38 C) OR HIGHER *CHILLS OR SWEATING *NAUSEA AND VOMITING THAT IS NOT CONTROLLED WITH YOUR NAUSEA MEDICATION *UNUSUAL SHORTNESS OF BREATH *UNUSUAL BRUISING OR BLEEDING *URINARY PROBLEMS (pain or burning when urinating, or frequent urination) *BOWEL PROBLEMS (unusual diarrhea, constipation, pain near the anus) TENDERNESS IN MOUTH AND THROAT WITH OR WITHOUT PRESENCE OF ULCERS (sore throat, sores in mouth, or a toothache) UNUSUAL RASH, SWELLING OR PAIN  UNUSUAL VAGINAL DISCHARGE OR ITCHING   Items with * indicate a potential emergency and should be followed up as soon as possible or go to the Emergency Department if any problems should occur.  Please show the CHEMOTHERAPY ALERT CARD or IMMUNOTHERAPY ALERT CARD at check-in to the Emergency  Department and triage nurse.  Should you have questions after your visit or need to cancel or reschedule your appointment, please contact [facility] [phone]  and follow the prompts.  Office hours are 8:00 a.m. to 4:30 p.m. Monday - Friday. Please note that voicemails left after 4:00 p.m. may not be returned until the following business day.  We are closed weekends and major holidays. You have access to a nurse at all times for urgent questions. Please call the main number to the clinic [phone] and follow the prompts.  For any non-urgent questions, you may also contact your provider using MyChart. We now offer e-Visits for anyone 57 and older to request care online for non-urgent symptoms. For details visit [email or web address].   Also download the MyChart app! Go to the app store, search "MyChart", open the app, select [facility], and log in with your MyChart username and password.  Due to Covid, a mask is required upon entering the hospital/clinic. If you do not have a mask, one will be given to you upon arrival. For doctor visits, patients may have 1 support person aged 72 or older with them. For treatment visits, patients cannot have anyone with them due to current Covid guidelines and our immunocompromised population.

## 2021-09-16 ENCOUNTER — Inpatient Hospital Stay (HOSPITAL_COMMUNITY): Payer: Medicare PPO | Attending: Hematology

## 2021-09-16 DIAGNOSIS — Z5111 Encounter for antineoplastic chemotherapy: Secondary | ICD-10-CM | POA: Diagnosis not present

## 2021-09-16 DIAGNOSIS — C9000 Multiple myeloma not having achieved remission: Secondary | ICD-10-CM | POA: Insufficient documentation

## 2021-09-16 LAB — CBC WITH DIFFERENTIAL/PLATELET
Abs Immature Granulocytes: 0.01 K/uL (ref 0.00–0.07)
Basophils Absolute: 0 K/uL (ref 0.0–0.1)
Basophils Relative: 1 %
Eosinophils Absolute: 0.1 K/uL (ref 0.0–0.5)
Eosinophils Relative: 2 %
HCT: 34.4 % — ABNORMAL LOW (ref 36.0–46.0)
Hemoglobin: 10.9 g/dL — ABNORMAL LOW (ref 12.0–15.0)
Immature Granulocytes: 0 %
Lymphocytes Relative: 14 %
Lymphs Abs: 0.6 K/uL — ABNORMAL LOW (ref 0.7–4.0)
MCH: 31.2 pg (ref 26.0–34.0)
MCHC: 31.7 g/dL (ref 30.0–36.0)
MCV: 98.6 fL (ref 80.0–100.0)
Monocytes Absolute: 0.4 K/uL (ref 0.1–1.0)
Monocytes Relative: 9 %
Neutro Abs: 3.3 K/uL (ref 1.7–7.7)
Neutrophils Relative %: 74 %
Platelets: 140 K/uL — ABNORMAL LOW (ref 150–400)
RBC: 3.49 MIL/uL — ABNORMAL LOW (ref 3.87–5.11)
RDW: 17.2 % — ABNORMAL HIGH (ref 11.5–15.5)
WBC: 4.4 K/uL (ref 4.0–10.5)
nRBC: 0 % (ref 0.0–0.2)

## 2021-09-16 LAB — COMPREHENSIVE METABOLIC PANEL
ALT: 19 U/L (ref 0–44)
AST: 28 U/L (ref 15–41)
Albumin: 3.2 g/dL — ABNORMAL LOW (ref 3.5–5.0)
Alkaline Phosphatase: 75 U/L (ref 38–126)
Anion gap: 5 (ref 5–15)
BUN: 17 mg/dL (ref 8–23)
CO2: 29 mmol/L (ref 22–32)
Calcium: 8.9 mg/dL (ref 8.9–10.3)
Chloride: 106 mmol/L (ref 98–111)
Creatinine, Ser: 1.02 mg/dL — ABNORMAL HIGH (ref 0.44–1.00)
GFR, Estimated: 57 mL/min — ABNORMAL LOW (ref >60)
Glucose, Bld: 120 mg/dL — ABNORMAL HIGH (ref 70–99)
Potassium: 3.8 mmol/L (ref 3.5–5.1)
Sodium: 140 mmol/L (ref 135–145)
Total Bilirubin: 0.7 mg/dL (ref 0.3–1.2)
Total Protein: 6.6 g/dL (ref 6.5–8.1)

## 2021-09-17 ENCOUNTER — Encounter (HOSPITAL_COMMUNITY): Payer: Self-pay

## 2021-09-17 ENCOUNTER — Inpatient Hospital Stay (HOSPITAL_COMMUNITY): Payer: Medicare PPO

## 2021-09-17 VITALS — BP 131/75 | HR 73 | Temp 96.6°F | Resp 18

## 2021-09-17 DIAGNOSIS — Z5111 Encounter for antineoplastic chemotherapy: Secondary | ICD-10-CM | POA: Diagnosis not present

## 2021-09-17 DIAGNOSIS — C9000 Multiple myeloma not having achieved remission: Secondary | ICD-10-CM | POA: Diagnosis not present

## 2021-09-17 MED ORDER — BORTEZOMIB CHEMO SQ INJECTION 3.5 MG (2.5MG/ML)
1.3000 mg/m2 | Freq: Once | INTRAMUSCULAR | Status: AC
  Administered 2021-09-17: 2 mg via SUBCUTANEOUS
  Filled 2021-09-17: qty 0.8

## 2021-09-17 MED ORDER — DEXAMETHASONE 4 MG PO TABS
10.0000 mg | ORAL_TABLET | Freq: Once | ORAL | Status: AC
  Administered 2021-09-17: 10 mg via ORAL
  Filled 2021-09-17: qty 3

## 2021-09-17 NOTE — Patient Instructions (Signed)
[facility]  Discharge Instructions: Thank you for choosing [facility] to provide your oncology and hematology care.  If you have a lab appointment with the [facility], please come in thru the Main Entrance and check in at the main information desk.  Wear comfortable clothing and clothing appropriate for easy access to any Portacath or PICC line.   We strive to give you quality time with your provider. You may need to reschedule your appointment if you arrive late (15 or more minutes).  Arriving late affects you and other patients whose appointments are after yours.  Also, if you miss three or more appointments without notifying the office, you may be dismissed from the clinic at the provider's discretion.      For prescription refill requests, have your pharmacy contact our office and allow 72 hours for refills to be completed.    Today you received the following: Velcade injection.       To help prevent nausea and vomiting after your treatment, we encourage you to take your nausea medication as directed.  BELOW ARE SYMPTOMS THAT SHOULD BE REPORTED IMMEDIATELY: *FEVER GREATER THAN 100.4 F (38 C) OR HIGHER *CHILLS OR SWEATING *NAUSEA AND VOMITING THAT IS NOT CONTROLLED WITH YOUR NAUSEA MEDICATION *UNUSUAL SHORTNESS OF BREATH *UNUSUAL BRUISING OR BLEEDING *URINARY PROBLEMS (pain or burning when urinating, or frequent urination) *BOWEL PROBLEMS (unusual diarrhea, constipation, pain near the anus) TENDERNESS IN MOUTH AND THROAT WITH OR WITHOUT PRESENCE OF ULCERS (sore throat, sores in mouth, or a toothache) UNUSUAL RASH, SWELLING OR PAIN  UNUSUAL VAGINAL DISCHARGE OR ITCHING   Items with * indicate a potential emergency and should be followed up as soon as possible or go to the Emergency Department if any problems should occur.  Please show the CHEMOTHERAPY ALERT CARD or IMMUNOTHERAPY ALERT CARD at check-in to the Emergency Department and triage  nurse.  Should you have questions after your visit or need to cancel or reschedule your appointment, please contact [facility] [phone]  and follow the prompts.  Office hours are 8:00 a.m. to 4:30 p.m. Monday - Friday. Please note that voicemails left after 4:00 p.m. may not be returned until the following business day.  We are closed weekends and major holidays. You have access to a nurse at all times for urgent questions. Please call the main number to the clinic [phone] and follow the prompts.  For any non-urgent questions, you may also contact your provider using MyChart. We now offer e-Visits for anyone 6 and older to request care online for non-urgent symptoms. For details visit [email or web address].   Also download the MyChart app! Go to the app store, search "MyChart", open the app, select [facility], and log in with your MyChart username and password.  Due to Covid, a mask is required upon entering the hospital/clinic. If you do not have a mask, one will be given to you upon arrival. For doctor visits, patients may have 1 support person aged 46 or older with them. For treatment visits, patients cannot have anyone with them due to current Covid guidelines and our immunocompromised population.

## 2021-09-17 NOTE — Progress Notes (Signed)
Patient presents today for Velcade injection. Labs drawn on 09/16/21 and within parameters for treatment. Vital signs within parameters for treatment.   Treatment given today per MD orders. Tolerated infusion without adverse affects. Vital signs stable. No complaints at this time. Discharged from clinic by wheel chair in stable condition. Alert and oriented x 3. F/U with [facility] as scheduled.

## 2021-09-18 DIAGNOSIS — Z23 Encounter for immunization: Secondary | ICD-10-CM | POA: Diagnosis not present

## 2021-09-20 DIAGNOSIS — Z23 Encounter for immunization: Secondary | ICD-10-CM | POA: Diagnosis not present

## 2021-09-23 ENCOUNTER — Inpatient Hospital Stay (HOSPITAL_COMMUNITY): Payer: Medicare PPO

## 2021-09-23 DIAGNOSIS — C9000 Multiple myeloma not having achieved remission: Secondary | ICD-10-CM | POA: Diagnosis not present

## 2021-09-23 DIAGNOSIS — Z5111 Encounter for antineoplastic chemotherapy: Secondary | ICD-10-CM | POA: Diagnosis not present

## 2021-09-23 LAB — COMPREHENSIVE METABOLIC PANEL
ALT: 17 U/L (ref 0–44)
AST: 26 U/L (ref 15–41)
Albumin: 3.2 g/dL — ABNORMAL LOW (ref 3.5–5.0)
Alkaline Phosphatase: 64 U/L (ref 38–126)
Anion gap: 7 (ref 5–15)
BUN: 20 mg/dL (ref 8–23)
CO2: 27 mmol/L (ref 22–32)
Calcium: 8.7 mg/dL — ABNORMAL LOW (ref 8.9–10.3)
Chloride: 108 mmol/L (ref 98–111)
Creatinine, Ser: 1.08 mg/dL — ABNORMAL HIGH (ref 0.44–1.00)
GFR, Estimated: 53 mL/min — ABNORMAL LOW (ref >60)
Glucose, Bld: 120 mg/dL — ABNORMAL HIGH (ref 70–99)
Potassium: 3.7 mmol/L (ref 3.5–5.1)
Sodium: 142 mmol/L (ref 135–145)
Total Bilirubin: 0.7 mg/dL (ref 0.3–1.2)
Total Protein: 6.5 g/dL (ref 6.5–8.1)

## 2021-09-23 LAB — CBC WITH DIFFERENTIAL/PLATELET
Abs Immature Granulocytes: 0.01 10*3/uL (ref 0.00–0.07)
Basophils Absolute: 0 10*3/uL (ref 0.0–0.1)
Basophils Relative: 1 %
Eosinophils Absolute: 0.2 10*3/uL (ref 0.0–0.5)
Eosinophils Relative: 4 %
HCT: 34.1 % — ABNORMAL LOW (ref 36.0–46.0)
Hemoglobin: 10.9 g/dL — ABNORMAL LOW (ref 12.0–15.0)
Immature Granulocytes: 0 %
Lymphocytes Relative: 13 %
Lymphs Abs: 0.6 10*3/uL — ABNORMAL LOW (ref 0.7–4.0)
MCH: 32.3 pg (ref 26.0–34.0)
MCHC: 32 g/dL (ref 30.0–36.0)
MCV: 101.2 fL — ABNORMAL HIGH (ref 80.0–100.0)
Monocytes Absolute: 0.4 10*3/uL (ref 0.1–1.0)
Monocytes Relative: 9 %
Neutro Abs: 3.6 10*3/uL (ref 1.7–7.7)
Neutrophils Relative %: 73 %
Platelets: 114 10*3/uL — ABNORMAL LOW (ref 150–400)
RBC: 3.37 MIL/uL — ABNORMAL LOW (ref 3.87–5.11)
RDW: 17.2 % — ABNORMAL HIGH (ref 11.5–15.5)
WBC: 4.9 10*3/uL (ref 4.0–10.5)
nRBC: 0 % (ref 0.0–0.2)

## 2021-09-24 ENCOUNTER — Inpatient Hospital Stay (HOSPITAL_COMMUNITY): Payer: Medicare PPO

## 2021-09-24 ENCOUNTER — Encounter: Payer: Self-pay | Admitting: Adult Health

## 2021-09-24 ENCOUNTER — Non-Acute Institutional Stay (INDEPENDENT_AMBULATORY_CARE_PROVIDER_SITE_OTHER): Payer: Medicare PPO | Admitting: Adult Health

## 2021-09-24 VITALS — BP 147/75 | HR 73 | Temp 96.8°F | Resp 18

## 2021-09-24 DIAGNOSIS — Z0000 Encounter for general adult medical examination without abnormal findings: Secondary | ICD-10-CM

## 2021-09-24 DIAGNOSIS — C9000 Multiple myeloma not having achieved remission: Secondary | ICD-10-CM

## 2021-09-24 DIAGNOSIS — Z5111 Encounter for antineoplastic chemotherapy: Secondary | ICD-10-CM | POA: Diagnosis not present

## 2021-09-24 MED ORDER — BORTEZOMIB CHEMO SQ INJECTION 3.5 MG (2.5MG/ML)
1.3000 mg/m2 | Freq: Once | INTRAMUSCULAR | Status: AC
  Administered 2021-09-24: 2 mg via SUBCUTANEOUS
  Filled 2021-09-24: qty 0.8

## 2021-09-24 MED ORDER — DEXAMETHASONE 4 MG PO TABS
10.0000 mg | ORAL_TABLET | Freq: Once | ORAL | Status: AC
  Administered 2021-09-24: 10 mg via ORAL
  Filled 2021-09-24: qty 3

## 2021-09-24 NOTE — Progress Notes (Signed)
Subjective:   Amanda Wu is a 75 y.o. female who presents for Medicare Annual (Subsequent) preventive examination.  Review of Systems    Review of Systems  Constitutional:  Negative for malaise/fatigue.  Respiratory:  Negative for cough and shortness of breath.   Cardiovascular:  Negative for chest pain, palpitations and leg swelling.  Gastrointestinal:  Negative for abdominal pain, constipation and heartburn.  Musculoskeletal:  Negative for back pain, joint pain and myalgias.  Skin: Negative.   Neurological:  Negative for dizziness.  Psychiatric/Behavioral:  The patient is not nervous/anxious.    Cardiac Risk Factors include: advanced age (>75mn, >>52women);diabetes mellitus;sedentary lifestyle     Objective:    Today's Vitals   09/24/21 1019  BP: (!) 142/69  Pulse: 60  Resp: 18  Temp: (!) 97.4 F (36.3 C)  Weight: 116 lb 12.8 oz (53 kg)  Height: 5' 9" (1.753 m)   Body mass index is 17.25 kg/m.  Advanced Directives 09/17/2021 09/10/2021 09/03/2021 08/27/2021 08/20/2021 08/15/2021 08/13/2021  Does Patient Have a Medical Advance Directive? Yes Yes;No _0   Type of AParamed[place]of [place]Living will [place] of facility DNR (pink MOST or yellow form);Living will [place] of facility DNR (pink MOST or yellow form) HG[place] CityLiving will Out of facility DNR (pink MOST or yellow form) Out of facility DNR (pink MOST or yellow form) Living will;Healthcare Power of Attorney  Does patient want to make changes to medical advance directive? - No - Patient declined No - Patient declined No - Patient declined - No - Patient declined -  Copy of HOphirin Chart? No - copy requested No - copy requested Yes - validated most recent copy scanned in chart (See row information) No - copy requested No - copy requested - No - copy requested  Would patient like information on  creating a medical advance directive? No - Patient declined No - Patient declined - No - Patient declined No - Patient declined - No - Patient declined    Current Medications (verified) Facility-Administered Encounter Medications as of 09/24/2021  Medication   prochlorperazine (COMPAZINE) tablet 10 mg   Outpatient Encounter Medications as of 09/24/2021  Medication Sig   acetaminophen (TYLENOL) 500 MG tablet Take 1,000 mg by mouth every 6 (six) hours as needed for mild pain.   acyclovir (ZOVIRAX) 400 MG tablet TAKE 1 TABLET BY MOUTH TWICE DAILY   aspirin EC 81 MG tablet Take 81 mg by mouth daily.   busPIRone (BUSPAR) 5 MG tablet Take 5 mg by mouth 2 (two) times daily. For anxiety   Calcium Carbonate-Vitamin D (CALCIUM-VITAMIN D3 PO) Take 400 Units by mouth daily in the afternoon.   cholestyramine (QUESTRAN) 4 g packet Take 4 g by mouth 2 (two) times daily between meals.   colchicine 0.6 MG tablet Take 0.6 mg by mouth daily.   Lactobacillus Probiotic TABS Take 2 tablets by mouth daily.   levothyroxine (SYNTHROID, LEVOTHROID) 25 MCG tablet Take 25 mcg by mouth daily before breakfast.   lipase/protease/amylase (CREON) 36000 UNITS CPEP capsule Take 36,000 Units by mouth 2 (two) times daily. With snacks between meals at 10 am and 8 pm   lipase/protease/amylase (CREON) 36000 UNITS CPEP capsule Take 72,000 Units by mouth 3 (three) times daily with meals. 8 am, 12 pm, and 6 pm   loperamide (IMODIUM A-D) 2 MG tablet Take 4 mg by mouth 3 (three) times daily.  For diarrhea   losartan (COZAAR) 50 MG tablet Take 1 tablet (50 mg total) by mouth daily.   Multiple Vitamins-Minerals (MULTIVITAMIN WOMEN 50+ PO) Take by mouth daily.    NON FORMULARY Regular diet no dairy products.   NON FORMULARY Wanderguard #[id number] to ankle for safety awareness. Check placement and function qshift. Special Instructions: Check placement and function qshift. Every Shift Day, Evening, Night   potassium chloride SA (KLOR-CON) 20  MEQ tablet Take 20 mEq by mouth daily.     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[place])    Dementia ([place])    Diabetes mellitus without complication ([place])    H/O partial nephrectomy    Hypokalemia    Hypothyroidism    Impaired cognition    Multiple myeloma (HCC)    Dr Maylon Peppers, [facility]   Thyroid disease    Past Surgical History:  Procedure Laterality Date   ABDOMINAL HYSTERECTOMY     CRANIOTOMY     for lymphoma   YAG LASER APPLICATION Right 8/50/2774   Procedure: YAG LASER APPLICATION;  Surgeon: [name], MD;  Location: [facility];  Service: Ophthalmology;  Laterality: Right;   Family History  Problem Relation Age of Onset   Depression Mother    Diabetes Mother    Heart disease Father    Cancer - Prostate Brother    Cancer Brother    Cancer Sister    Social History   Socioeconomic History   Marital status: Single    Spouse name: Not on file   Number of children: Not on file   Years of education: Not on file   Highest education level: Not on file  Occupational History   Occupation: retired   Tobacco Use   Smoking status: Never   Smokeless tobacco: Never  Vaping Use   Vaping Use: Never used  Substance and Sexual Activity   Alcohol use: No   Drug use: No   Sexual activity: Never  Other Topics Concern   Not on file  Social History Narrative   Long term resident of [facility]    Social Determinants of Health   Financial Resource Strain: Low Risk    Difficulty of Paying Living Expenses: Not very hard  Food Insecurity: No Food Insecurity   Worried About Charity fundraiser in the Last Year: Never true   [place] in the Last Year: Never true  Transportation Needs: No Transportation Needs   Lack of Transportation (Medical): No   Lack of Transportation (Non-Medical): No  Physical Activity: Inactive   Days of Exercise per Week: 0 days    Minutes of Exercise per Session: 0 min  Stress: No Stress Concern Present   Feeling of Stress : Not at all  Social Connections: Moderately Isolated   Frequency of Communication with Friends and Family: More than three times a week   Frequency of Social Gatherings with Friends and Family: Once a week   Attends Religious Services: More than 4 times per year   Active Member of [employer] or Organizations: No   Attends Archivist Meetings: Never   Marital Status: Never married    Tobacco Counseling Counseling given: Not Answered   Clinical Intake:  Pre-visit preparation completed: Yes  Pain : No/denies pain     BMI - recorded: 17.25 Nutritional Status: BMI <19  Underweight Nutritional Risks: Unintentional weight  loss, Failure to thrive Diabetes: Yes CBG done?: Yes (per facility) CBG resulted in Enter/ Edit results?: Yes (per [facility]) Did pt. bring in CBG monitor from home?: No  How often do you need to have someone help you when you read instructions, pamphlets, or other written materials from your doctor or pharmacy?: 5 - Always  Diabetic?yes   Interpreter Needed?: No      Activities of Daily Living In your present state of health, do you have any difficulty performing the following activities: 09/24/2021  Hearing? N  Vision? N  Difficulty concentrating or making decisions? Y  Walking or climbing stairs? Y  Dressing or bathing? Y  Doing errands, shopping? Y  Preparing Food and eating ? Y  Using the Toilet? Y  In the past six months, have you accidently leaked urine? Y  Do you have problems with loss of bowel control? Y  Managing your Medications? Y  Managing your Finances? Y  Housekeeping or managing your Housekeeping? Y  Some recent data might be hidden    Patient Care Team: Gerlene Fee, NP as PCP - General ([place], [place] ([place]) Eloise Harman, DO as Consulting Physician (Internal  Medicine)  Indicate any recent Medical Services you may have received from other than [facility] providers in the past year (date may be approximate).     Assessment:   This is a routine wellness examination for Raveen.  Hearing/Vision screen No results found.  Dietary issues and exercise activities discussed: Current Exercise Habits: The patient does not participate in regular exercise at present, Exercise limited by: None identified   Goals Addressed             This Visit's Progress    DIET - INCREASE WATER INTAKE       Follow up with Provider as scheduled   On track    General - Client will not be readmitted within 30 days (C-SNP)   On track      Depression Screen PHQ 2/9 Scores 09/24/2021 10/16/2020 08/15/2020  PHQ - 2 Score 0 0 0    Fall Risk Fall Risk  09/24/2021 08/15/2020  Falls in the past year? 1 1  Number falls in past yr: 1 0  Injury with Fall? 0 0  Risk for fall due to : History of fall(s);Impaired balance/gait;Impaired mobility -  Follow up Falls evaluation completed -    FALL RISK PREVENTION PERTAINING TO THE HOME:  Any stairs in or around the home? No  If so, are there any without handrails? Yes  Home free of loose throw rugs in walkways, pet beds, electrical cords, etc? yes  Adequate lighting in your home to reduce risk of falls? Yes   ASSISTIVE DEVICES UTILIZED TO PREVENT FALLS:  Life alert? No  Use of a cane, walker or w/c? Yes  Grab bars in the bathroom? Yes  Shower chair or bench in shower? Yes  Elevated toilet seat or a handicapped toilet? Yes   TIMED UP AND GO:  Was the test performed? No .  Length of time to ambulate nonambiulatory   Gait unsteady with use of assistive device, provider informed and education provided.   Cognitive Function:     6CIT Screen 08/15/2020  What Year? 0 points  What month? 0 points  What time? 0 points  Count back from 20 2 points  Months in reverse 2 points  Repeat phrase 4 points  Total Score 8     Immunizations Immunization  History  Administered Date(s) Administered   Fluad Quad(high Dose 65+) 08/30/2019   Influenza, High Dose Seasonal PF 11/15/2015, 10/16/2016, 09/17/2017, 09/14/2018   Influenza,inj,Quad PF,6+ Mos 09/21/2014   Influenza-Unspecified 09/25/2011, 10/14/2012, 10/20/2013, 09/21/2014, 11/15/2015, 10/16/2016, 09/21/2020   Moderna SARS-COV2 Booster Vaccination 07/31/2021   Moderna Sars-Covid-2 Vaccination 02/16/2020, 03/12/2020, 08/08/2020, 03/27/2021   Pneumococcal Polysaccharide-23 07/10/2017   Pneumococcal-Unspecified 11/20/2010   Tdap 10/14/2012, 08/26/2015    TDAP status: Up to date  Flu Vaccine status: Up to date  Pneumococcal vaccine status: Up to date  Covid-19 vaccine status: Information provided on how to obtain vaccines.   Qualifies for Shingles Vaccine? Yes   Zostavax completed Yes   Shingrix Completed?: Yes  Screening Tests Health Maintenance  Topic Date Due   Zoster Vaccines- Shingrix (1 of 2) Never done   INFLUENZA VACCINE  10/11/2021 (Originally 07/15/2021)   OPHTHALMOLOGY EXAM  02/13/2022   HEMOGLOBIN A1C  03/05/2022   FOOT EXAM  07/15/2022   URINE MICROALBUMIN  09/05/2022   TETANUS/TDAP  08/25/2025   DEXA SCAN  Completed   COVID-19 Vaccine  Completed   Hepatitis C Screening  Completed   HPV VACCINES  Aged Out    Health Maintenance  Health Maintenance Due  Topic Date Due   Zoster Vaccines- Shingrix (1 of 2) Never done    Colorectal cancer screening: No longer required.   Mammogram status: No longer required due to age .  Bone Density status: Completed  . Results reflect: Bone density results: OSTEOPOROSIS. Repeat every 2 years.  Lung Cancer Screening: (Low Dose CT Chest recommended if Age 87-80 years, 30 pack-year currently smoking OR have quit w/in 15years.) does not qualify.   Lung Cancer Screening Referral: n/a   Additional Screening:  Hepatitis C Screening: does not qualify; Completed   Vision Screening:  Recommended annual ophthalmology exams for early detection of glaucoma and other disorders of the eye. Is the patient up to date with their annual eye exam?  Yes  Who is the provider or what is the name of the office in which the patient attends annual eye exams?  If pt is not established with a provider, would they like to be referred to a provider to establish care? No .   Dental Screening: Recommended annual dental exams for proper oral hygiene  Community Resource Referral / Chronic Care Management: CRR required this visit?  No   CCM required this visit?  No      Plan:     I have personally reviewed and noted the following in the patient's chart:   Medical and social history Use of alcohol, tobacco or illicit drugs  Current medications and supplements including opioid prescriptions.  Functional ability and status Nutritional status Physical activity Advanced directives List of other physicians Hospitalizations, surgeries, and ER visits in previous 12 months Vitals Screenings to include cognitive, depression, and falls Referrals and appointments  In addition, I have reviewed and discussed with patient certain preventive protocols, quality metrics, and best practice recommendations. A written personalized care plan for preventive services as well as general preventive health recommendations were provided to patient.     Gerlene Fee, NP   09/24/2021   Nurse Notes:

## 2021-09-24 NOTE — Progress Notes (Signed)
Jackilyn M Finken presents today for Velcade injection per the provider's orders.  Stable during  administration without incident; injection site WNL; see MAR for injection details.  Patient tolerated procedure well and without incident.  No questions or complaints noted at this time. Treatment given today per MD orders.  Tolerated infusion without adverse affects.  Vital signs stable.  No complaints at this time.  Discharge from clinic via wheelchair in stable condition.  Alert and oriented X 3.  Follow up with [facility] as scheduled.

## 2021-09-24 NOTE — Patient Instructions (Signed)
  Amanda Wu , Thank you for taking time to come for your Medicare Wellness Visit. I appreciate your ongoing commitment to your health goals. Please review the following plan we discussed and let me know if I can assist you in the future.   These are the goals we discussed:  Goals      DIET - INCREASE WATER INTAKE     Follow up with Provider as scheduled     General - Client will not be readmitted within 30 days (C-SNP)        This is a list of the screening recommended for you and due dates:  Health Maintenance  Topic Date Due   Zoster (Shingles) Vaccine (1 of 2) Never done   Flu Shot  10/11/2021*   Eye exam for diabetics  02/13/2022   Hemoglobin A1C  03/05/2022   Complete foot exam   07/15/2022   Urine Protein Check  09/05/2022   Tetanus Vaccine  08/25/2025   DEXA scan (bone density measurement)  Completed   COVID-19 Vaccine  Completed   Hepatitis C Screening: USPSTF Recommendation to screen - Ages 2-79 yo.  Completed   HPV Vaccine  Aged Out  *Topic was postponed. The date shown is not the original due date.

## 2021-09-24 NOTE — Patient Instructions (Signed)
[place]  Discharge Instructions: Thank you for choosing [facility] to provide your oncology and hematology care.  If you have a lab appointment with the [facility], please come in thru the Main Entrance and check in at the main information desk.  Wear comfortable clothing and clothing appropriate for easy access to any Portacath or PICC line.   We strive to give you quality time with your provider. You may need to reschedule your appointment if you arrive late (15 or more minutes).  Arriving late affects you and other patients whose appointments are after yours.  Also, if you miss three or more appointments without notifying the office, you may be dismissed from the clinic at the provider's discretion.      For prescription refill requests, have your pharmacy contact our office and allow 72 hours for refills to be completed.    Today you received the following chemotherapy and/or immunotherapy agents Velcade      To help prevent nausea and vomiting after your treatment, we encourage you to take your nausea medication as directed.  BELOW ARE SYMPTOMS THAT SHOULD BE REPORTED IMMEDIATELY: *FEVER GREATER THAN 100.4 F (38 C) OR HIGHER *CHILLS OR SWEATING *NAUSEA AND VOMITING THAT IS NOT CONTROLLED WITH YOUR NAUSEA MEDICATION *UNUSUAL SHORTNESS OF BREATH *UNUSUAL BRUISING OR BLEEDING *URINARY PROBLEMS (pain or burning when urinating, or frequent urination) *BOWEL PROBLEMS (unusual diarrhea, constipation, pain near the anus) TENDERNESS IN MOUTH AND THROAT WITH OR WITHOUT PRESENCE OF ULCERS (sore throat, sores in mouth, or a toothache) UNUSUAL RASH, SWELLING OR PAIN  UNUSUAL VAGINAL DISCHARGE OR ITCHING   Items with * indicate a potential emergency and should be followed up as soon as possible or go to the Emergency Department if any problems should occur.  Please show the CHEMOTHERAPY ALERT CARD or IMMUNOTHERAPY ALERT CARD at check-in to the Emergency  Department and triage nurse.  Should you have questions after your visit or need to cancel or reschedule your appointment, please contact [facility] [phone]  and follow the prompts.  Office hours are 8:00 a.m. to 4:30 p.m. Monday - Friday. Please note that voicemails left after 4:00 p.m. may not be returned until the following business day.  We are closed weekends and major holidays. You have access to a nurse at all times for urgent questions. Please call the main number to the clinic [phone] and follow the prompts.  For any non-urgent questions, you may also contact your provider using MyChart. We now offer e-Visits for anyone 57 and older to request care online for non-urgent symptoms. For details visit [email or web address].   Also download the MyChart app! Go to the app store, search "MyChart", open the app, select [facility], and log in with your MyChart username and password.  Due to Covid, a mask is required upon entering the hospital/clinic. If you do not have a mask, one will be given to you upon arrival. For doctor visits, patients may have 1 support person aged 72 or older with them. For treatment visits, patients cannot have anyone with them due to current Covid guidelines and our immunocompromised population.

## 2021-09-25 ENCOUNTER — Ambulatory Visit (INDEPENDENT_AMBULATORY_CARE_PROVIDER_SITE_OTHER): Payer: Medicare PPO | Admitting: Gastroenterology

## 2021-09-25 VITALS — BP 108/68 | HR 84 | Temp 97.4°F | Wt 117.4 lb

## 2021-09-25 DIAGNOSIS — K8689 Other specified diseases of pancreas: Secondary | ICD-10-CM | POA: Diagnosis not present

## 2021-09-25 NOTE — Progress Notes (Signed)
Referring Provider: Gerlene Fee, NP Primary Care Physician:  Gerlene Fee, NP Primary GI: Dr. Abbey Chatters   Chief Complaint  Patient presents with   Follow-up     HPI:   Amanda Wu is a 75 y.o. female presenting today with a history of diarrhea, last seen in April 2022. Resident of [facility].  Patient has history of Multiple Myeloma and receives treatment through Dr. Delton Coombes. 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 Diarrhea suspected to be possibly related to Velcade and/or Revlimid along with pancreatic insufficiency.   She was started on Creon earlier this year. Additional supportive measures including Questran and Imodium. Returns today in good spirits. Denies abdominal pain, N/V. No overt GI bleeding. She believes diarrhea is better. Caretaker present and confirms this. Prior notes also reviewed. Weight is stable. She states she has a good appetite.   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[place])    Dementia ([place])    Diabetes mellitus without complication ([place])    H/O partial nephrectomy    Hypokalemia    Hypothyroidism    Impaired cognition    Multiple myeloma (HCC)    Dr Maylon Peppers, [facility]   Thyroid disease     Past Surgical History:  Procedure Laterality Date   ABDOMINAL HYSTERECTOMY     CRANIOTOMY     for lymphoma   YAG LASER APPLICATION Right 01/26/2481   Procedure: YAG LASER APPLICATION;  Surgeon: [name], MD;  Location: [facility];  Service: Ophthalmology;  Laterality: Right;    Outpatient Medications:    Current Meds  Medication Sig   acetaminophen (TYLENOL) 500 MG tablet Take 1,000 mg by mouth every 6 (six) hours as needed for mild pain.   acyclovir (ZOVIRAX) 400 MG tablet TAKE 1 TABLET BY MOUTH TWICE DAILY   aspirin EC 81 MG tablet Take 81 mg by mouth daily.   busPIRone (BUSPAR) 5 MG tablet Take 5 mg by mouth 2 (two)  times daily. For anxiety   Calcium Carbonate-Vitamin D (CALCIUM-VITAMIN D3 PO) Take 400 Units by mouth daily in the afternoon.   cholestyramine (QUESTRAN) 4 g packet Take 4 g by mouth 2 (two) times daily between meals.   Lactobacillus Probiotic TABS Take 2 tablets by mouth daily.   levothyroxine (SYNTHROID, LEVOTHROID) 25 MCG tablet Take 25 mcg by mouth daily before breakfast.   lipase/protease/amylase (CREON) 36000 UNITS CPEP capsule Take 36,000 Units by mouth 2 (two) times daily. With snacks between meals at 10 am and 8 pm   lipase/protease/amylase (CREON) 36000 UNITS CPEP capsule Take 72,000 Units by mouth 3 (three) times daily with meals. 8 am, 12 pm, and 6 pm   loperamide (IMODIUM A-D) 2 MG tablet Take 4 mg by mouth 3 (three) times daily. For diarrhea   losartan (COZAAR) 50 MG tablet Take 1 tablet (50 mg total) by mouth daily.   Multiple Vitamins-Minerals (MULTIVITAMIN WOMEN 50+ PO) Take by mouth daily.    NON FORMULARY Regular diet no dairy products.   NON FORMULARY Wanderguard #2237 to ankle for safety awareness. Check placement and function qshift. Special Instructions: Check placement and function qshift. Every Shift Day, Evening, Night   potassium chloride SA (KLOR-CON) 20 MEQ tablet Take 20 mEq by mouth daily.      Facility-Administered Medications Ordered in Other Visits  Medication Dose Route Frequency Provider Last  Rate Last Admin   prochlorperazine (COMPAZINE) tablet 10 mg  10 mg Oral Once Derek Jack, MD        Allergies as of 09/25/2021 - Review Complete 09/25/2021  Allergen Reaction Noted   Lotensin [benazepril]  12/05/2020    Family History  Problem Relation Age of Onset   Depression Mother    Diabetes Mother    Heart disease Father    Cancer - Prostate Brother    Cancer Brother    Cancer Sister     Social History   Socioeconomic History   Marital status: Single    Spouse name: Not on file   Number of children: Not on file   Years of education:  Not on file   Highest education level: Not on file  Occupational History   Occupation: retired   Tobacco Use   Smoking status: Never   Smokeless tobacco: Never  Vaping Use   Vaping Use: Never used  Substance and Sexual Activity   Alcohol use: No   Drug use: No   Sexual activity: Never  Other Topics Concern   Not on file  Social History Narrative   Long term resident of [facility]    Social Determinants of Health   Financial Resource Strain: Low Risk    Difficulty of Paying Living Expenses: Not very hard  Food Insecurity: No Food Insecurity   Worried About Charity fundraiser in the Last Year: Never true   [place] in the Last Year: Never true  Transportation Needs: No Transportation Needs   Lack of Transportation (Medical): No   Lack of Transportation (Non-Medical): No  Physical Activity: Inactive   Days of Exercise per Week: 0 days   Minutes of Exercise per Session: 0 min  Stress: No Stress Concern Present   Feeling of Stress : Not at all  Social Connections: Moderately Isolated   Frequency of Communication with Friends and Family: More than three times a week   Frequency of Social Gatherings with Friends and Family: Once a week   Attends Religious Services: More than 4 times per year   Active Member of [employer] or Organizations: No   Attends Archivist Meetings: Never   Marital Status: Never married    Review of Systems: Gen: Denies fever, chills, anorexia. Denies fatigue, weakness, weight loss.  CV: Denies chest pain, palpitations, syncope, peripheral edema, and claudication. Resp: Denies dyspnea at rest, cough, wheezing, coughing up blood, and pleurisy. GI: see HPI Derm: Denies rash, itching, dry skin Psych: Denies depression, anxiety, memory loss, confusion. No homicidal or suicidal ideation.  Heme: Denies bruising, bleeding, and enlarged lymph nodes.  Physical Exam: BP 108/68   Pulse 84   Temp (!) 97.4 F (36.3 C) (Temporal)   Wt 117 lb 6.4 oz  (53.3 kg)   BMI 17.34 kg/m  General:   Alert and oriented. No distress noted. Pleasant and cooperative.  Head:  Normocephalic and atraumatic. Eyes:  Conjuctiva clear without scleral icterus. Mouth:  mask in place Abdomen:  +BS, soft, non-tender and non-distended. No rebound or guarding. No HSM or masses noted. Msk:  Symmetrical without gross deformities. Normal posture. Extremities:  Without edema. Neurologic:  Alert and  oriented x4 Psych:  Alert and cooperative. Normal mood and affect.  ASSESSMENT/PLAN: KALENA MANDER is a 75 y.o. female presenting today in follow-up for chronic diarrhea likely multifactorial and secondary to med effect and possible pancreatic insufficiency. She is doing well on Creon, along with Questran and  Imodium  Weight is stable. No alarm signs/symptoms.  As she is stable, we will see her in 1 year. Instructed to call if any changes, and we will see sooner.    Annitta Needs, PhD, ANP-BC [facility]

## 2021-09-25 NOTE — Patient Instructions (Signed)
Continue Creon as you are doing!  Please call if any worsening diarrhea or other concerns.  We will see you in 1 year!  I enjoyed seeing you again today! As you know, I value our relationship and want to provide genuine, compassionate, and quality care. I welcome your feedback. If you receive a survey regarding your visit,  I greatly appreciate you taking time to fill this out. See you next time!  Annitta Needs, PhD, ANP-BC [facility]

## 2021-10-07 ENCOUNTER — Encounter: Payer: Self-pay | Admitting: Adult Health

## 2021-10-07 ENCOUNTER — Non-Acute Institutional Stay (SKILLED_NURSING_FACILITY): Payer: Medicare PPO | Admitting: Adult Health

## 2021-10-07 ENCOUNTER — Inpatient Hospital Stay (HOSPITAL_COMMUNITY): Payer: Medicare PPO

## 2021-10-07 DIAGNOSIS — C9000 Multiple myeloma not having achieved remission: Secondary | ICD-10-CM | POA: Diagnosis not present

## 2021-10-07 DIAGNOSIS — I129 Hypertensive chronic kidney disease with stage 1 through stage 4 chronic kidney disease, or unspecified chronic kidney disease: Secondary | ICD-10-CM

## 2021-10-07 DIAGNOSIS — S32010D Wedge compression fracture of first lumbar vertebra, subsequent encounter for fracture with routine healing: Secondary | ICD-10-CM

## 2021-10-07 DIAGNOSIS — M810 Age-related osteoporosis without current pathological fracture: Secondary | ICD-10-CM

## 2021-10-07 DIAGNOSIS — D696 Thrombocytopenia, unspecified: Secondary | ICD-10-CM | POA: Diagnosis not present

## 2021-10-07 DIAGNOSIS — E1122 Type 2 diabetes mellitus with diabetic chronic kidney disease: Secondary | ICD-10-CM | POA: Diagnosis not present

## 2021-10-07 DIAGNOSIS — N183 Chronic kidney disease, stage 3 unspecified: Secondary | ICD-10-CM | POA: Diagnosis not present

## 2021-10-07 DIAGNOSIS — Z5111 Encounter for antineoplastic chemotherapy: Secondary | ICD-10-CM | POA: Diagnosis not present

## 2021-10-07 LAB — CBC WITH DIFFERENTIAL/PLATELET
Abs Immature Granulocytes: 0.02 10*3/uL (ref 0.00–0.07)
Basophils Absolute: 0 10*3/uL (ref 0.0–0.1)
Basophils Relative: 1 %
Eosinophils Absolute: 0.1 10*3/uL (ref 0.0–0.5)
Eosinophils Relative: 3 %
HCT: 32.5 % — ABNORMAL LOW (ref 36.0–46.0)
Hemoglobin: 10.5 g/dL — ABNORMAL LOW (ref 12.0–15.0)
Immature Granulocytes: 0 %
Lymphocytes Relative: 13 %
Lymphs Abs: 0.8 10*3/uL (ref 0.7–4.0)
MCH: 32.4 pg (ref 26.0–34.0)
MCHC: 32.3 g/dL (ref 30.0–36.0)
MCV: 100.3 fL — ABNORMAL HIGH (ref 80.0–100.0)
Monocytes Absolute: 0.7 10*3/uL (ref 0.1–1.0)
Monocytes Relative: 13 %
Neutro Abs: 3.9 10*3/uL (ref 1.7–7.7)
Neutrophils Relative %: 70 %
Platelets: 163 10*3/uL (ref 150–400)
RBC: 3.24 MIL/uL — ABNORMAL LOW (ref 3.87–5.11)
RDW: 16.9 % — ABNORMAL HIGH (ref 11.5–15.5)
WBC: 5.6 10*3/uL (ref 4.0–10.5)
nRBC: 0 % (ref 0.0–0.2)

## 2021-10-07 LAB — COMPREHENSIVE METABOLIC PANEL
ALT: 22 U/L (ref 0–44)
AST: 29 U/L (ref 15–41)
Albumin: 3.2 g/dL — ABNORMAL LOW (ref 3.5–5.0)
Alkaline Phosphatase: 69 U/L (ref 38–126)
Anion gap: 6 (ref 5–15)
BUN: 25 mg/dL — ABNORMAL HIGH (ref 8–23)
CO2: 26 mmol/L (ref 22–32)
Calcium: 8.8 mg/dL — ABNORMAL LOW (ref 8.9–10.3)
Chloride: 106 mmol/L (ref 98–111)
Creatinine, Ser: 1.14 mg/dL — ABNORMAL HIGH (ref 0.44–1.00)
GFR, Estimated: 50 mL/min — ABNORMAL LOW (ref >60)
Glucose, Bld: 105 mg/dL — ABNORMAL HIGH (ref 70–99)
Potassium: 3.8 mmol/L (ref 3.5–5.1)
Sodium: 138 mmol/L (ref 135–145)
Total Bilirubin: 0.9 mg/dL (ref 0.3–1.2)
Total Protein: 6.7 g/dL (ref 6.5–8.1)

## 2021-10-07 NOTE — Progress Notes (Signed)
Location:  [place] Room Number: 148 Place of Service:  SNF (31)   CODE STATUS: dnr  Allergies  Allergen Reactions   Lotensin [Benazepril]     Chief Complaint  Patient presents with   Medical Management of Chronic Issues           Diabetes mellitus  with stage 3 chronic kidney disease and hypertension:  Post menopausal osteoporosis    Compression fracture of L1 vertebrae with routine healing subsequent encounter Thrombocytopenia    HPI:  She is a 75 year old long term resident of this facility being seen for the management of her chronic illnesses: Diabetes mellitus  with stage 3 chronic kidney disease and hypertension:  Post menopausal osteoporosis    Compression fracture of L1 vertebrae with routine healing subsequent encounter Thrombocytopenia. There are no reports of uncontrolled pain. No changes in appetite; weight is stable. No reports of agitation present.   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[place])    Dementia ([place])    Diabetes mellitus without complication ([place])    H/O partial nephrectomy    Hypokalemia    Hypothyroidism    Impaired cognition    Multiple myeloma (HCC)    Dr Maylon Peppers, [facility]   Thyroid disease     Past Surgical History:  Procedure Laterality Date   ABDOMINAL HYSTERECTOMY     CRANIOTOMY     for lymphoma   YAG LASER APPLICATION Right 01/10/5169   Procedure: YAG LASER APPLICATION;  Surgeon: [name], MD;  Location: [facility];  Service: Ophthalmology;  Laterality: Right;    Social History   Socioeconomic History   Marital status: Single    Spouse name: Not on file   Number of children: Not on file   Years of education: Not on file   Highest education level: Not on file  Occupational History   Occupation: retired   Tobacco Use   Smoking status: Never   Smokeless tobacco: Never  Vaping Use   Vaping Use: Never used  Substance and Sexual  Activity   Alcohol use: No   Drug use: No   Sexual activity: Never  Other Topics Concern   Not on file  Social History Narrative   Long term resident of [facility]    Social Determinants of Health   Financial Resource Strain: Low Risk    Difficulty of Paying Living Expenses: Not very hard  Food Insecurity: No Food Insecurity   Worried About Charity fundraiser in the Last Year: Never true   [place] in the Last Year: Never true  Transportation Needs: No Transportation Needs   Lack of Transportation (Medical): No   Lack of Transportation (Non-Medical): No  Physical Activity: Inactive   Days of Exercise per Week: 0 days   Minutes of Exercise per Session: 0 min  Stress: No Stress Concern Present   Feeling of Stress : Not at all  Social Connections: Moderately Isolated   Frequency of Communication with Friends and Family: More than three times a week   Frequency of Social Gatherings with Friends and Family: Once a week   Attends Religious Services: More than 4 times per year   Active Member of [employer] or Organizations: No   Attends Archivist Meetings: Never   Marital Status: Never married  Human resources officer Violence: Not on file   Family History  Problem Relation  Age of Onset   Depression Mother    Diabetes Mother    Heart disease Father    Cancer - Prostate Brother    Cancer Brother    Cancer Sister       VITAL SIGNS BP 128/64   Pulse 84   Temp (!) 97.4 F (36.3 C)   Ht '5\' 9"'  (1.753 m)   Wt 116 lb 12.8 oz (53 kg)   BMI 17.25 kg/m   Facility-Administered Encounter Medications as of 10/07/2021  Medication   prochlorperazine (COMPAZINE) tablet 10 mg   Outpatient Encounter Medications as of 10/07/2021  Medication Sig   acetaminophen (TYLENOL) 500 MG tablet Take 1,000 mg by mouth every 6 (six) hours as needed for mild pain.   acyclovir (ZOVIRAX) 400 MG tablet TAKE 1 TABLET BY MOUTH TWICE DAILY   aspirin EC 81 MG tablet Take 81 mg by mouth daily.    busPIRone (BUSPAR) 5 MG tablet Take 5 mg by mouth 2 (two) times daily. For anxiety   Calcium Carbonate-Vitamin D (CALCIUM-VITAMIN D3 PO) Take 400 Units by mouth daily in the afternoon.   cholestyramine (QUESTRAN) 4 g packet Take 4 g by mouth 2 (two) times daily between meals.   colchicine 0.6 MG tablet Take 0.6 mg by mouth daily.   Lactobacillus Probiotic TABS Take 2 tablets by mouth daily.   levothyroxine (SYNTHROID, LEVOTHROID) 25 MCG tablet Take 25 mcg by mouth daily before breakfast.   lipase/protease/amylase (CREON) 36000 UNITS CPEP capsule Take 36,000 Units by mouth 2 (two) times daily. With snacks between meals at 10 am and 8 pm   lipase/protease/amylase (CREON) 36000 UNITS CPEP capsule Take 72,000 Units by mouth 3 (three) times daily with meals. 8 am, 12 pm, and 6 pm   loperamide (IMODIUM A-D) 2 MG tablet Take 4 mg by mouth 3 (three) times daily. For diarrhea   losartan (COZAAR) 50 MG tablet Take 1 tablet (50 mg total) by mouth daily.   Multiple Vitamins-Minerals (MULTIVITAMIN WOMEN 50+ PO) Take by mouth daily.    NON FORMULARY Regular diet no dairy products.   NON FORMULARY Wanderguard #2237 to ankle for safety awareness. Check placement and function qshift. Special Instructions: Check placement and function qshift. Every Shift Day, Evening, Night   potassium chloride SA (KLOR-CON) 20 MEQ tablet Take 20 mEq by mouth daily.      SIGNIFICANT DIAGNOSTIC EXAMS  PREVIOUS   08-10-20: t score: -3.758  12-21-20: KUBThere is a mild ileus noted. Mild to moderate increase feces in the colon. No renal stone is seen  12-25-20: KUB  There is a nonspecific bowel gas pattern without obstruction. No residual ileus is noted. Mild increased feces in the colon. No renal stone is seen.  02-05-21: ct of head:  1. No acute intracranial abnormality. Please note if concern for acute infarction MRI would be more sensitive. 2. Similar appearance of bifrontal encephalomalacia consistent with prior  infarction/insult  02-05-21: MRI of brain:  No evidence of recent infarction, hemorrhage, or mass. Bifrontal encephalomalacia/gliosis. Additional chronic microvascular ischemic changes. Questioned small left anterior clinoid process calcified meningioma on prior CT imaging is not well evaluated due to size and lack of contrast.  NO NEW EXAMS.     LABS REVIEWED PREVIOUS    09-18-20: wbc 4.4; hgb 9.6; hct 29.7 mcv 102.4 plt 105; glucose 99; bun 20; creat 1.12; k+ 3.2; na++ 142; ca 8.8 liver normal albumin 3.2  10-16-20: wbc 3.9; hgb 10.2; hct 32.3; mcv 104.9 plt 95; glucose 98; bun  19; creat 1.25; k+ 3.9; na++ 139; ca 8.8 liver normal albumin 3.2  12-04-20: wbc 3.1; hgb 10.9; hct 34.6; mcv 103.0 plt 130; glucose 132; bun 27; creat 1.35; k+ 3.6; na++ 143; ca 9.3 liver normal albumin 3.5  12-25-20: wbc 2.7; hgb 10.1; hct 31.2 mcv 103.0 plt 120; glucose 108; bun 13; creat 1.53; k+ 3.9; na++ 138; ca 8.7 liver normal albumin 3.2 GFR 3 01-01-21: wbc 2.5; hgb 10.6; hct 32.1; mcv 100.6 plt 87; glucose 87; bun 27; creat 1.24; k+ 3.8; na++ 144; ca 9.3 GFR 45; liver normal albumin 3.4  02-05-21: wbc 3.9; hgb 10.2; hct 31.6; mcv 102.9 plt 91; glucose 118; bun 26; creat 1.29; k+ 3.8; na++ 138; ca 8.7 GFR43 liver normal albumin 3.0 urine culture: e-coli 02-07-21: hgb a1c 5.3 04-08-21: chol 142; ldl 66; trig 101; hdl 56 04-09-21: wbc 4.6; hgb 11.3; hct 35.3; mcv 100.6 plt 140; glucose 101; bun 20; creat 1.13; k+ 3.5; na++ 139; ca 8.5 GFR 50; liver normal albumin 3.4  06-18-21: wbc 4.5; hgb 10.6; hct 33.7; mcv 98.5 plt 174; glucose 111; bun 22; creat 1.20 ;k+ 3.7; na++ 141; ca 8.9; GFR 47; liver normal albumin 3.4 07-15-21: wbc 4.7; hgb 11.7; hct 35.9; mcv 97.0 plt 220; glucose 161; bun 21; creat 1.23; k+ 3.9; na++ 138; ca 8.8; GFR 46 liver normal albumin 3.2 uric acid 4.4 08-08-21: urine culture: 70,000 e-coli 08-26-21: wbc 6.3; hgb 11.1; hct 35.3; mcv 99.2 plt 165; glucose 117; bun 16; creat 1.02; k+ 4.0; na++ 141; ca 9.1  GFR 57; liver normal albumin 3.1   TODAY  09-05-21: hgb a1c 5.9; urine micro-albumin 7.9 10-07-21: wbc 5.6; hgb 10.5; hct 32.5; mcv 100.3 plt 163; glucose 105; bun 25; creat 1.14; k+ 3.8; na++ 138; ca 8.8; GFR 50 liver normal albumin 3.2   Review of Systems  Constitutional:  Negative for malaise/fatigue.  Respiratory:  Negative for cough and shortness of breath.   Cardiovascular:  Negative for chest pain, palpitations and leg swelling.  Gastrointestinal:  Negative for abdominal pain, constipation and heartburn.  Musculoskeletal:  Negative for back pain, joint pain and myalgias.  Skin: Negative.   Neurological:  Negative for dizziness.  Psychiatric/Behavioral:  The patient is not nervous/anxious.    Physical Exam Constitutional:      General: She is not in acute distress.    Appearance: She is cachectic. She is not diaphoretic.  Neck:     Thyroid: No thyromegaly.  Cardiovascular:     Rate and Rhythm: Normal rate and regular rhythm.     Pulses: Normal pulses.     Heart sounds: Normal heart sounds.  Pulmonary:     Effort: Pulmonary effort is normal. No respiratory distress.     Breath sounds: Normal breath sounds.  Abdominal:     General: Bowel sounds are normal. There is no distension.     Palpations: Abdomen is soft.     Tenderness: There is no abdominal tenderness.  Musculoskeletal:        General: Normal range of motion.     Cervical back: Neck supple.     Right lower leg: No edema.     Left lower leg: No edema.  Lymphadenopathy:     Cervical: No cervical adenopathy.  Skin:    General: Skin is warm and dry.  Neurological:     Mental Status: She is alert. Mental status is at baseline.  Psychiatric:        Mood and Affect: Mood normal.  ASSESSMENT/ PLAN:  TODAY  Diabetes mellitus  with stage 3 chronic kidney disease and hypertension: is stable hgb a1c 5.9 is on arb and statin  2. Post menopausal osteoporosis t score -3.758 will continue calcium and vitamin d    3. Compression fracture of L1 vertebrae with routine healing subsequent encounter (06/2017) is stable has prn tylenol   4. Thrombocytopenia: is stable plt 163 will monitor   PREVIOUS   5. Multiple myeloma without achieving remission: is without change: is followed by oncology  6. Aortic atherosclerosis: (ct 08-26-15) will  Monitor   7. First degree av block/bradycardia: is stable has been seen by cardiology will monitor    8. Pancreatic insufficiency: is stable will continue questran 4 gm twice daily; creon 72,000 units with meals and imodium 2 mg three times daily   9. Macrocytic anemia: stable; hgb 11.1; vit B 12: 620; is off iron (due to diarrhea) will monitor   10. Vascular dementia with behavioral disturbance: is stable weight is 115 pounds; will continue buspar 5 mg daily to help with anxiety. Is off aricept   11. Herpes: no recent outbreaks will continue acyclovir 400 mg twice daily   12. Failure to thrive in adult: is stable her weight is 116 pounds; will continue to monitor  13. Hypokalemia: k+ 3.8 will continue k+ 20 meq daily   14. Hypothyroidism, unspecified type: is stable tsh 2.264 will continue synthroid 25 mcg daily   15. Hypertension associated with type 2 diabetes mellitus: is stable b/p 128/64 will continue cozaar 50 mg daily asa 81 mg daily           Ok Edwards NP [facility]  Contact [phone] Monday through Friday 8am- 5pm  After hours call [phone]

## 2021-10-08 ENCOUNTER — Ambulatory Visit (HOSPITAL_COMMUNITY): Payer: Medicare PPO

## 2021-10-08 ENCOUNTER — Other Ambulatory Visit (HOSPITAL_COMMUNITY)
Admission: RE | Admit: 2021-10-08 | Discharge: 2021-10-08 | Disposition: A | Discharge status: Home / Self Care | Payer: Medicare PPO | Source: Skilled Nursing Facility | Attending: Adult Health | Admitting: Adult Health

## 2021-10-08 DIAGNOSIS — Z20822 Contact with and (suspected) exposure to covid-19: Secondary | ICD-10-CM | POA: Diagnosis not present

## 2021-10-08 DIAGNOSIS — I129 Hypertensive chronic kidney disease with stage 1 through stage 4 chronic kidney disease, or unspecified chronic kidney disease: Secondary | ICD-10-CM | POA: Insufficient documentation

## 2021-10-08 DIAGNOSIS — B963 Hemophilus influenzae [H. influenzae] as the cause of diseases classified elsewhere: Secondary | ICD-10-CM | POA: Diagnosis not present

## 2021-10-08 DIAGNOSIS — E1122 Type 2 diabetes mellitus with diabetic chronic kidney disease: Secondary | ICD-10-CM | POA: Insufficient documentation

## 2021-10-08 DIAGNOSIS — N183 Chronic kidney disease, stage 3 unspecified: Secondary | ICD-10-CM | POA: Insufficient documentation

## 2021-10-08 LAB — RESP PANEL BY RT-PCR (FLU A&B, COVID) ARPGX2
Influenza A by PCR: NEGATIVE
Influenza B by PCR: NEGATIVE
SARS Coronavirus 2 by RT PCR: NEGATIVE

## 2021-10-09 DIAGNOSIS — Z1159 Encounter for screening for other viral diseases: Secondary | ICD-10-CM | POA: Diagnosis not present

## 2021-10-09 DIAGNOSIS — C8590 Non-Hodgkin lymphoma, unspecified, unspecified site: Secondary | ICD-10-CM | POA: Diagnosis not present

## 2021-10-10 ENCOUNTER — Other Ambulatory Visit (HOSPITAL_COMMUNITY)
Admission: RE | Admit: 2021-10-10 | Discharge: 2021-10-10 | Disposition: A | Discharge status: Home / Self Care | Payer: Medicare PPO | Source: Skilled Nursing Facility | Attending: Adult Health | Admitting: Adult Health

## 2021-10-10 DIAGNOSIS — E1159 Type 2 diabetes mellitus with other circulatory complications: Secondary | ICD-10-CM | POA: Diagnosis not present

## 2021-10-10 LAB — TSH: TSH: 2.577 u[IU]/mL (ref 0.350–4.500)

## 2021-10-10 LAB — LIPID PANEL
Cholesterol: 153 mg/dL (ref 0–200)
HDL: 61 mg/dL (ref >40)
LDL Cholesterol: 81 mg/dL (ref 0–99)
Total CHOL/HDL Ratio: 2.5 RATIO
Triglycerides: 56 mg/dL (ref <150)
VLDL: 11 mg/dL (ref 0–40)

## 2021-10-14 ENCOUNTER — Inpatient Hospital Stay (HOSPITAL_COMMUNITY): Payer: Medicare PPO

## 2021-10-14 DIAGNOSIS — C9000 Multiple myeloma not having achieved remission: Secondary | ICD-10-CM | POA: Diagnosis not present

## 2021-10-14 DIAGNOSIS — Z5111 Encounter for antineoplastic chemotherapy: Secondary | ICD-10-CM | POA: Diagnosis not present

## 2021-10-14 LAB — CBC WITH DIFFERENTIAL/PLATELET
Abs Immature Granulocytes: 0.01 10*3/uL (ref 0.00–0.07)
Basophils Absolute: 0 10*3/uL (ref 0.0–0.1)
Basophils Relative: 1 %
Eosinophils Absolute: 0.1 10*3/uL (ref 0.0–0.5)
Eosinophils Relative: 3 %
HCT: 34.8 % — ABNORMAL LOW (ref 36.0–46.0)
Hemoglobin: 11.2 g/dL — ABNORMAL LOW (ref 12.0–15.0)
Immature Granulocytes: 0 %
Lymphocytes Relative: 16 %
Lymphs Abs: 0.6 10*3/uL — ABNORMAL LOW (ref 0.7–4.0)
MCH: 31.7 pg (ref 26.0–34.0)
MCHC: 32.2 g/dL (ref 30.0–36.0)
MCV: 98.6 fL (ref 80.0–100.0)
Monocytes Absolute: 0.4 10*3/uL (ref 0.1–1.0)
Monocytes Relative: 9 %
Neutro Abs: 2.8 10*3/uL (ref 1.7–7.7)
Neutrophils Relative %: 71 %
Platelets: 175 10*3/uL (ref 150–400)
RBC: 3.53 MIL/uL — ABNORMAL LOW (ref 3.87–5.11)
RDW: 16 % — ABNORMAL HIGH (ref 11.5–15.5)
WBC: 3.9 10*3/uL — ABNORMAL LOW (ref 4.0–10.5)
nRBC: 0 % (ref 0.0–0.2)

## 2021-10-14 LAB — COMPREHENSIVE METABOLIC PANEL
ALT: 17 U/L (ref 0–44)
AST: 29 U/L (ref 15–41)
Albumin: 3.4 g/dL — ABNORMAL LOW (ref 3.5–5.0)
Alkaline Phosphatase: 67 U/L (ref 38–126)
Anion gap: 7 (ref 5–15)
BUN: 20 mg/dL (ref 8–23)
CO2: 29 mmol/L (ref 22–32)
Calcium: 9.2 mg/dL (ref 8.9–10.3)
Chloride: 105 mmol/L (ref 98–111)
Creatinine, Ser: 1.24 mg/dL — ABNORMAL HIGH (ref 0.44–1.00)
GFR, Estimated: 45 mL/min — ABNORMAL LOW (ref >60)
Glucose, Bld: 159 mg/dL — ABNORMAL HIGH (ref 70–99)
Potassium: 4 mmol/L (ref 3.5–5.1)
Sodium: 141 mmol/L (ref 135–145)
Total Bilirubin: 0.4 mg/dL (ref 0.3–1.2)
Total Protein: 7.1 g/dL (ref 6.5–8.1)

## 2021-10-15 ENCOUNTER — Inpatient Hospital Stay (HOSPITAL_COMMUNITY): Payer: Medicare PPO | Attending: Hematology

## 2021-10-15 ENCOUNTER — Encounter (HOSPITAL_COMMUNITY): Payer: Self-pay

## 2021-10-15 VITALS — BP 126/72 | HR 73 | Temp 98.0°F | Resp 18 | Wt 113.3 lb

## 2021-10-15 DIAGNOSIS — C9000 Multiple myeloma not having achieved remission: Secondary | ICD-10-CM | POA: Insufficient documentation

## 2021-10-15 DIAGNOSIS — Z5111 Encounter for antineoplastic chemotherapy: Secondary | ICD-10-CM | POA: Diagnosis not present

## 2021-10-15 MED ORDER — BORTEZOMIB CHEMO SQ INJECTION 3.5 MG (2.5MG/ML)
1.3000 mg/m2 | Freq: Once | INTRAMUSCULAR | Status: AC
  Administered 2021-10-15: 2 mg via SUBCUTANEOUS
  Filled 2021-10-15: qty 0.8

## 2021-10-15 MED ORDER — DEXAMETHASONE 4 MG PO TABS
10.0000 mg | ORAL_TABLET | Freq: Once | ORAL | Status: AC
  Administered 2021-10-15: 10 mg via ORAL
  Filled 2021-10-15: qty 3

## 2021-10-15 NOTE — Progress Notes (Signed)
Patient presents today for Velcade injection. Labs from 10/14/21 are within parameters for treatment today. Patient's vital signs within parameters for treatment today.   Velcade given today per MD orders. Tolerated without adverse affects. Vital signs stable. No complaints at this time. Discharged from clinic by wheel chair accompanied by a CNA in stable condition. Alert and oriented x 3. F/U with [facility] as scheduled.

## 2021-10-15 NOTE — Patient Instructions (Signed)
[facility]  Discharge Instructions: Thank you for choosing [facility] to provide your oncology and hematology care.  If you have a lab appointment with the [facility], please come in thru the Main Entrance and check in at the main information desk.  Wear comfortable clothing and clothing appropriate for easy access to any Portacath or PICC line.   We strive to give you quality time with your provider. You may need to reschedule your appointment if you arrive late (15 or more minutes).  Arriving late affects you and other patients whose appointments are after yours.  Also, if you miss three or more appointments without notifying the office, you may be dismissed from the clinic at the provider's discretion.      For prescription refill requests, have your pharmacy contact our office and allow 72 hours for refills to be completed.    Today you received the following chemotherapy and/or immunotherapy agents Velcade injection .       To help prevent nausea and vomiting after your treatment, we encourage you to take your nausea medication as directed.  BELOW ARE SYMPTOMS THAT SHOULD BE REPORTED IMMEDIATELY: *FEVER GREATER THAN 100.4 F (38 C) OR HIGHER *CHILLS OR SWEATING *NAUSEA AND VOMITING THAT IS NOT CONTROLLED WITH YOUR NAUSEA MEDICATION *UNUSUAL SHORTNESS OF BREATH *UNUSUAL BRUISING OR BLEEDING *URINARY PROBLEMS (pain or burning when urinating, or frequent urination) *BOWEL PROBLEMS (unusual diarrhea, constipation, pain near the anus) TENDERNESS IN MOUTH AND THROAT WITH OR WITHOUT PRESENCE OF ULCERS (sore throat, sores in mouth, or a toothache) UNUSUAL RASH, SWELLING OR PAIN  UNUSUAL VAGINAL DISCHARGE OR ITCHING   Items with * indicate a potential emergency and should be followed up as soon as possible or go to the Emergency Department if any problems should occur.  Please show the CHEMOTHERAPY ALERT CARD or IMMUNOTHERAPY ALERT CARD at check-in to the  Emergency Department and triage nurse.  Should you have questions after your visit or need to cancel or reschedule your appointment, please contact [facility] [phone]  and follow the prompts.  Office hours are 8:00 a.m. to 4:30 p.m. Monday - Friday. Please note that voicemails left after 4:00 p.m. may not be returned until the following business day.  We are closed weekends and major holidays. You have access to a nurse at all times for urgent questions. Please call the main number to the clinic [phone] and follow the prompts.  For any non-urgent questions, you may also contact your provider using MyChart. We now offer e-Visits for anyone 67 and older to request care online for non-urgent symptoms. For details visit [email or web address].   Also download the MyChart app! Go to the app store, search "MyChart", open the app, select [facility], and log in with your MyChart username and password.  Due to Covid, a mask is required upon entering the hospital/clinic. If you do not have a mask, one will be given to you upon arrival. For doctor visits, patients may have 1 support person aged 87 or older with them. For treatment visits, patients cannot have anyone with them due to current Covid guidelines and our immunocompromised population.

## 2021-10-21 ENCOUNTER — Inpatient Hospital Stay (HOSPITAL_COMMUNITY): Payer: Medicare PPO

## 2021-10-21 DIAGNOSIS — C9000 Multiple myeloma not having achieved remission: Secondary | ICD-10-CM | POA: Diagnosis not present

## 2021-10-21 DIAGNOSIS — Z5111 Encounter for antineoplastic chemotherapy: Secondary | ICD-10-CM | POA: Diagnosis not present

## 2021-10-21 LAB — COMPREHENSIVE METABOLIC PANEL
ALT: 18 U/L (ref 0–44)
AST: 28 U/L (ref 15–41)
Albumin: 3.4 g/dL — ABNORMAL LOW (ref 3.5–5.0)
Alkaline Phosphatase: 64 U/L (ref 38–126)
Anion gap: 6 (ref 5–15)
BUN: 20 mg/dL (ref 8–23)
CO2: 27 mmol/L (ref 22–32)
Calcium: 9.1 mg/dL (ref 8.9–10.3)
Chloride: 107 mmol/L (ref 98–111)
Creatinine, Ser: 1.27 mg/dL — ABNORMAL HIGH (ref 0.44–1.00)
GFR, Estimated: 44 mL/min — ABNORMAL LOW (ref >60)
Glucose, Bld: 117 mg/dL — ABNORMAL HIGH (ref 70–99)
Potassium: 3.7 mmol/L (ref 3.5–5.1)
Sodium: 140 mmol/L (ref 135–145)
Total Bilirubin: 0.4 mg/dL (ref 0.3–1.2)
Total Protein: 6.7 g/dL (ref 6.5–8.1)

## 2021-10-21 LAB — CBC WITH DIFFERENTIAL/PLATELET
Abs Immature Granulocytes: 0.01 10*3/uL (ref 0.00–0.07)
Basophils Absolute: 0 10*3/uL (ref 0.0–0.1)
Basophils Relative: 1 %
Eosinophils Absolute: 0.2 10*3/uL (ref 0.0–0.5)
Eosinophils Relative: 4 %
HCT: 36.3 % (ref 36.0–46.0)
Hemoglobin: 11.6 g/dL — ABNORMAL LOW (ref 12.0–15.0)
Immature Granulocytes: 0 %
Lymphocytes Relative: 18 %
Lymphs Abs: 0.7 10*3/uL (ref 0.7–4.0)
MCH: 32.3 pg (ref 26.0–34.0)
MCHC: 32 g/dL (ref 30.0–36.0)
MCV: 101.1 fL — ABNORMAL HIGH (ref 80.0–100.0)
Monocytes Absolute: 0.4 10*3/uL (ref 0.1–1.0)
Monocytes Relative: 12 %
Neutro Abs: 2.5 10*3/uL (ref 1.7–7.7)
Neutrophils Relative %: 65 %
Platelets: 144 10*3/uL — ABNORMAL LOW (ref 150–400)
RBC: 3.59 MIL/uL — ABNORMAL LOW (ref 3.87–5.11)
RDW: 16.1 % — ABNORMAL HIGH (ref 11.5–15.5)
WBC: 3.8 10*3/uL — ABNORMAL LOW (ref 4.0–10.5)
nRBC: 0 % (ref 0.0–0.2)

## 2021-10-22 ENCOUNTER — Inpatient Hospital Stay (HOSPITAL_COMMUNITY): Payer: Medicare PPO

## 2021-10-22 VITALS — BP 138/86 | HR 60 | Temp 96.9°F | Resp 18

## 2021-10-22 DIAGNOSIS — C9000 Multiple myeloma not having achieved remission: Secondary | ICD-10-CM | POA: Diagnosis not present

## 2021-10-22 DIAGNOSIS — Z5111 Encounter for antineoplastic chemotherapy: Secondary | ICD-10-CM | POA: Diagnosis not present

## 2021-10-22 MED ORDER — BORTEZOMIB CHEMO SQ INJECTION 3.5 MG (2.5MG/ML)
1.3000 mg/m2 | Freq: Once | INTRAMUSCULAR | Status: AC
  Administered 2021-10-22: 2 mg via SUBCUTANEOUS
  Filled 2021-10-22: qty 0.8

## 2021-10-22 MED ORDER — DEXAMETHASONE 4 MG PO TABS
10.0000 mg | ORAL_TABLET | Freq: Once | ORAL | Status: AC
  Administered 2021-10-22: 10 mg via ORAL
  Filled 2021-10-22: qty 3

## 2021-10-22 NOTE — Progress Notes (Signed)
Pt presents today for Velcade injection per provider's order. Vital signs stable and pt voiced no new complaints at this time. Okay to proceed with treatment today.  Velcade injection given today per MD orders. Tolerated infusion without adverse affects. Vital signs stable. No complaints at this time. Discharged from clinic via wheelchair in stable condition. Alert and oriented x 3. F/U with [facility] as scheduled.

## 2021-10-25 ENCOUNTER — Encounter: Payer: Self-pay | Admitting: Adult Health

## 2021-10-25 ENCOUNTER — Other Ambulatory Visit (HOSPITAL_COMMUNITY): Payer: Self-pay

## 2021-10-25 ENCOUNTER — Non-Acute Institutional Stay (SKILLED_NURSING_FACILITY): Payer: Medicare PPO | Admitting: Adult Health

## 2021-10-25 DIAGNOSIS — D696 Thrombocytopenia, unspecified: Secondary | ICD-10-CM

## 2021-10-25 DIAGNOSIS — F01518 Vascular dementia, unspecified severity, with other behavioral disturbance: Secondary | ICD-10-CM

## 2021-10-25 DIAGNOSIS — I700 Atherosclerosis of aorta: Secondary | ICD-10-CM | POA: Diagnosis not present

## 2021-10-25 DIAGNOSIS — C9000 Multiple myeloma not having achieved remission: Secondary | ICD-10-CM

## 2021-10-25 NOTE — Progress Notes (Signed)
Location:  [facility] Room Number: 148-W Place of Service:  SNF (31)   CODE STATUS: DNR  Allergies  Allergen Reactions   Lotensin [Benazepril]     Chief Complaint  Patient presents with   Acute Visit    Care plan meeting    HPI:  We have come together for her care plan meeting. Family present. There have been no falls. She is out of bed daily to wheelchair; has an unsteady gait. She requires limited to extensive assist with her adls. She is frequently incontinent of bladder and bowel. Dietary: weight is 118.8 pounds and stable; regular diet: has a fair appetite. Therapy none at this time.  She continues to be followed for her chronic illnesses including:   Aortic atherosclerosis  Vascular dementia with behavioral disturbance  Thrombocytopenia   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HCC)    Dementia ([place])    Diabetes mellitus without complication ([place])    H/O partial nephrectomy    Hypokalemia    Hypothyroidism    Impaired cognition    Multiple myeloma (HCC)    Dr Maylon Peppers, [facility]   Thyroid disease     Past Surgical History:  Procedure Laterality Date   ABDOMINAL HYSTERECTOMY     CRANIOTOMY     for lymphoma   YAG LASER APPLICATION Right 04/02/3789   Procedure: YAG LASER APPLICATION;  Surgeon: [name], MD;  Location: [facility];  Service: Ophthalmology;  Laterality: Right;    Social History   Socioeconomic History   Marital status: Single    Spouse name: Not on file   Number of children: Not on file   Years of education: Not on file   Highest education level: Not on file  Occupational History   Occupation: retired   Tobacco Use   Smoking status: Never   Smokeless tobacco: Never  Vaping Use   Vaping Use: Never used  Substance and Sexual Activity   Alcohol use: No   Drug use: No   Sexual activity: Never  Other Topics Concern   Not on file  Social History  Narrative   Long term resident of [facility]    Social Determinants of Health   Financial Resource Strain: Not on file  Food Insecurity: Not on file  Transportation Needs: Not on file  Physical Activity: Not on file  Stress: Not on file  Social Connections: Not on file  Intimate Partner Violence: Not on file   Family History  Problem Relation Age of Onset   Depression Mother    Diabetes Mother    Heart disease Father    Cancer - Prostate Brother    Cancer Brother    Cancer Sister       VITAL SIGNS BP 110/60   Pulse 80   Temp (!) 97.4 F (36.3 C)   Resp 20   Ht '5\' 9"'  (1.753 m)   Wt 118 lb 12.8 oz (53.9 kg)   SpO2 97%   BMI 17.54 kg/m   Facility-Administered Encounter Medications as of 10/25/2021  Medication   prochlorperazine (COMPAZINE) tablet 10 mg   Outpatient Encounter Medications as of 10/25/2021  Medication Sig   acetaminophen (TYLENOL) 500 MG tablet Take 1,000 mg by mouth every 6 (six) hours as needed for mild pain.   acyclovir (ZOVIRAX) 400 MG tablet TAKE 1 TABLET BY MOUTH TWICE DAILY   aspirin EC 81 MG tablet Take  81 mg by mouth daily.   busPIRone (BUSPAR) 5 MG tablet Take 5 mg by mouth 2 (two) times daily. For anxiety   Calcium Carbonate-Vitamin D (CALCIUM-VITAMIN D3 PO) Take 400 Units by mouth daily in the afternoon.   cholestyramine (QUESTRAN) 4 g packet Take 4 g by mouth 2 (two) times daily between meals.   Lactobacillus Probiotic TABS Take 2 tablets by mouth daily.   levothyroxine (SYNTHROID, LEVOTHROID) 25 MCG tablet Take 25 mcg by mouth daily before breakfast.   lipase/protease/amylase (CREON) 36000 UNITS CPEP capsule Take 36,000 Units by mouth 2 (two) times daily. With snacks between meals at 10 am and 8 pm   lipase/protease/amylase (CREON) 36000 UNITS CPEP capsule Take 72,000 Units by mouth 3 (three) times daily with meals. 8 am, 12 pm, and 6 pm   loperamide (IMODIUM A-D) 2 MG tablet Take 4 mg by mouth 3 (three) times daily. For diarrhea   losartan  (COZAAR) 50 MG tablet Take 1 tablet (50 mg total) by mouth daily.   NON FORMULARY Regular diet no dairy products.   NON FORMULARY Wanderguard #[id number] to ankle for safety awareness. Check placement and function qshift. Special Instructions: Check placement and function qshift. Every Shift Day, Evening, Night   potassium chloride SA (KLOR-CON) 20 MEQ tablet Take 20 mEq by mouth daily.    colchicine 0.6 MG tablet Take 0.6 mg by mouth daily.   [DISCONTINUED] Multiple Vitamins-Minerals (MULTIVITAMIN WOMEN 50+ PO) Take by mouth daily.      SIGNIFICANT DIAGNOSTIC EXAMS   PREVIOUS   08-10-20: t score: -3.758  12-21-20: KUBThere is a mild ileus noted. Mild to moderate increase feces in the colon. No renal stone is seen  12-25-20: KUB  There is a nonspecific bowel gas pattern without obstruction. No residual ileus is noted. Mild increased feces in the colon. No renal stone is seen.  02-05-21: ct of head:  1. No acute intracranial abnormality. Please note if concern for acute infarction MRI would be more sensitive. 2. Similar appearance of bifrontal encephalomalacia consistent with prior infarction/insult  02-05-21: MRI of brain:  No evidence of recent infarction, hemorrhage, or mass. Bifrontal encephalomalacia/gliosis. Additional chronic microvascular ischemic changes. Questioned small left anterior clinoid process calcified meningioma on prior CT imaging is not well evaluated due to size and lack of contrast.  NO NEW EXAMS.     LABS REVIEWED PREVIOUS    10-16-20: wbc 3.9; hgb 10.2; hct 32.3; mcv 104.9 plt 95; glucose 98; bun 19; creat 1.25; k+ 3.9; na++ 139; ca 8.8 liver normal albumin 3.2  12-04-20: wbc 3.1; hgb 10.9; hct 34.6; mcv 103.0 plt 130; glucose 132; bun 27; creat 1.35; k+ 3.6; na++ 143; ca 9.3 liver normal albumin 3.5  12-25-20: wbc 2.7; hgb 10.1; hct 31.2 mcv 103.0 plt 120; glucose 108; bun 13; creat 1.53; k+ 3.9; na++ 138; ca 8.7 liver normal albumin 3.2 GFR 3 01-01-21: wbc 2.5; hgb  10.6; hct 32.1; mcv 100.6 plt 87; glucose 87; bun 27; creat 1.24; k+ 3.8; na++ 144; ca 9.3 GFR 45; liver normal albumin 3.4  02-05-21: wbc 3.9; hgb 10.2; hct 31.6; mcv 102.9 plt 91; glucose 118; bun 26; creat 1.29; k+ 3.8; na++ 138; ca 8.7 GFR43 liver normal albumin 3.0 urine culture: e-coli 02-07-21: hgb a1c 5.3 04-08-21: chol 142; ldl 66; trig 101; hdl 56 04-09-21: wbc 4.6; hgb 11.3; hct 35.3; mcv 100.6 plt 140; glucose 101; bun 20; creat 1.13; k+ 3.5; na++ 139; ca 8.5 GFR 50; liver normal albumin 3.4  06-18-21: wbc 4.5; hgb 10.6; hct 33.7; mcv 98.5 plt 174; glucose 111; bun 22; creat 1.20 ;k+ 3.7; na++ 141; ca 8.9; GFR 47; liver normal albumin 3.4 07-15-21: wbc 4.7; hgb 11.7; hct 35.9; mcv 97.0 plt 220; glucose 161; bun 21; creat 1.23; k+ 3.9; na++ 138; ca 8.8; GFR 46 liver normal albumin 3.2 uric acid 4.4 08-08-21: urine culture: 70,000 e-coli 08-26-21: wbc 6.3; hgb 11.1; hct 35.3; mcv 99.2 plt 165; glucose 117; bun 16; creat 1.02; k+ 4.0; na++ 141; ca 9.1 GFR 57; liver normal albumin 3.1  09-05-21: hgb a1c 5.9; urine micro-albumin 7.9 10-07-21: wbc 5.6; hgb 10.5; hct 32.5; mcv 100.3 plt 163; glucose 105; bun 25; creat 1.14; k+ 3.8; na++ 138; ca 8.8; GFR 50 liver normal albumin 3.2   NO NEW LABS.   Review of Systems  Constitutional:  Negative for malaise/fatigue.  Respiratory:  Negative for cough and shortness of breath.   Cardiovascular:  Negative for chest pain, palpitations and leg swelling.  Gastrointestinal:  Negative for abdominal pain, constipation and heartburn.  Musculoskeletal:  Negative for back pain, joint pain and myalgias.  Skin: Negative.   Neurological:  Negative for dizziness.  Psychiatric/Behavioral:  The patient is not nervous/anxious.    Physical Exam Constitutional:      General: She is not in acute distress.    Appearance: She is underweight. She is not diaphoretic.  Neck:     Thyroid: No thyromegaly.  Cardiovascular:     Rate and Rhythm: Normal rate and regular rhythm.      Pulses: Normal pulses.     Heart sounds: Normal heart sounds.  Pulmonary:     Effort: Pulmonary effort is normal. No respiratory distress.     Breath sounds: Normal breath sounds.  Abdominal:     General: Bowel sounds are normal. There is no distension.     Palpations: Abdomen is soft.     Tenderness: There is no abdominal tenderness.  Musculoskeletal:        General: Normal range of motion.     Cervical back: Neck supple.     Right lower leg: No edema.     Left lower leg: No edema.  Lymphadenopathy:     Cervical: No cervical adenopathy.  Skin:    General: Skin is warm and dry.  Neurological:     Mental Status: She is alert. Mental status is at baseline.  Psychiatric:        Mood and Affect: Mood normal.      ASSESSMENT/ PLAN:  TODAY  Aortic atherosclerosis Vascular dementia with behavioral disturbance Thrombocytopenia   Will continue current medications Will continue current plan of care Will continue current medications  Time spent with patient 40 minutes: medications; plan of care.    Ok Edwards NP [facility]  Contact [phone] Monday through Friday 8am- 5pm  After hours call [phone]

## 2021-10-28 ENCOUNTER — Inpatient Hospital Stay (HOSPITAL_COMMUNITY): Payer: Medicare PPO

## 2021-10-28 DIAGNOSIS — C9000 Multiple myeloma not having achieved remission: Secondary | ICD-10-CM

## 2021-10-28 DIAGNOSIS — Z5111 Encounter for antineoplastic chemotherapy: Secondary | ICD-10-CM | POA: Diagnosis not present

## 2021-10-28 LAB — CBC WITH DIFFERENTIAL/PLATELET
Abs Immature Granulocytes: 0.01 10*3/uL (ref 0.00–0.07)
Basophils Absolute: 0 10*3/uL (ref 0.0–0.1)
Basophils Relative: 0 %
Eosinophils Absolute: 0.1 10*3/uL (ref 0.0–0.5)
Eosinophils Relative: 2 %
HCT: 35.8 % — ABNORMAL LOW (ref 36.0–46.0)
Hemoglobin: 11.9 g/dL — ABNORMAL LOW (ref 12.0–15.0)
Immature Granulocytes: 0 %
Lymphocytes Relative: 12 %
Lymphs Abs: 0.6 10*3/uL — ABNORMAL LOW (ref 0.7–4.0)
MCH: 32.9 pg (ref 26.0–34.0)
MCHC: 33.2 g/dL (ref 30.0–36.0)
MCV: 98.9 fL (ref 80.0–100.0)
Monocytes Absolute: 0.4 10*3/uL (ref 0.1–1.0)
Monocytes Relative: 8 %
Neutro Abs: 3.8 10*3/uL (ref 1.7–7.7)
Neutrophils Relative %: 78 %
Platelets: 119 10*3/uL — ABNORMAL LOW (ref 150–400)
RBC: 3.62 MIL/uL — ABNORMAL LOW (ref 3.87–5.11)
RDW: 16 % — ABNORMAL HIGH (ref 11.5–15.5)
WBC: 4.9 10*3/uL (ref 4.0–10.5)
nRBC: 0 % (ref 0.0–0.2)

## 2021-10-28 LAB — COMPREHENSIVE METABOLIC PANEL
ALT: 36 U/L (ref 0–44)
AST: 60 U/L — ABNORMAL HIGH (ref 15–41)
Albumin: 3.4 g/dL — ABNORMAL LOW (ref 3.5–5.0)
Alkaline Phosphatase: 67 U/L (ref 38–126)
Anion gap: 5 (ref 5–15)
BUN: 17 mg/dL (ref 8–23)
CO2: 29 mmol/L (ref 22–32)
Calcium: 9 mg/dL (ref 8.9–10.3)
Chloride: 108 mmol/L (ref 98–111)
Creatinine, Ser: 1.22 mg/dL — ABNORMAL HIGH (ref 0.44–1.00)
GFR, Estimated: 46 mL/min — ABNORMAL LOW (ref >60)
Glucose, Bld: 148 mg/dL — ABNORMAL HIGH (ref 70–99)
Potassium: 3.9 mmol/L (ref 3.5–5.1)
Sodium: 142 mmol/L (ref 135–145)
Total Bilirubin: 0.7 mg/dL (ref 0.3–1.2)
Total Protein: 7 g/dL (ref 6.5–8.1)

## 2021-10-28 LAB — LACTATE DEHYDROGENASE: LDH: 215 U/L — ABNORMAL HIGH (ref 98–192)

## 2021-10-28 LAB — MAGNESIUM: Magnesium: 2.3 mg/dL (ref 1.7–2.4)

## 2021-10-29 ENCOUNTER — Inpatient Hospital Stay (HOSPITAL_COMMUNITY): Payer: Medicare PPO

## 2021-10-29 ENCOUNTER — Encounter (HOSPITAL_COMMUNITY): Payer: Self-pay

## 2021-10-29 VITALS — BP 126/76 | HR 63 | Temp 96.6°F | Resp 18 | Wt 115.2 lb

## 2021-10-29 DIAGNOSIS — C9000 Multiple myeloma not having achieved remission: Secondary | ICD-10-CM | POA: Diagnosis not present

## 2021-10-29 DIAGNOSIS — Z5111 Encounter for antineoplastic chemotherapy: Secondary | ICD-10-CM | POA: Diagnosis not present

## 2021-10-29 MED ORDER — BORTEZOMIB CHEMO SQ INJECTION 3.5 MG (2.5MG/ML)
1.3000 mg/m2 | Freq: Once | INTRAMUSCULAR | Status: AC
  Administered 2021-10-29: 2 mg via SUBCUTANEOUS
  Filled 2021-10-29: qty 0.8

## 2021-10-29 MED ORDER — DEXAMETHASONE 4 MG PO TABS
10.0000 mg | ORAL_TABLET | Freq: Once | ORAL | Status: AC
  Administered 2021-10-29: 10 mg via ORAL
  Filled 2021-10-29: qty 3

## 2021-10-29 NOTE — Patient Instructions (Signed)
Amanda Wu  Discharge Instructions: Thank you for choosing [facility] to provide your oncology and hematology care.  If you have a lab appointment with the [facility], please come in thru the Main Entrance and check in at the main information desk.  Wear comfortable clothing and clothing appropriate for easy access to any Portacath or PICC line.   We strive to give you quality time with your provider. You may need to reschedule your appointment if you arrive late (15 or more minutes).  Arriving late affects you and other patients whose appointments are after yours.  Also, if you miss three or more appointments without notifying the office, you may be dismissed from the clinic at the provider's discretion.      For prescription refill requests, have your pharmacy contact our office and allow 72 hours for refills to be completed.    Today you received the following chemotherapy and/or immunotherapy agents velcade Follow up as scheduled      To help prevent nausea and vomiting after your treatment, we encourage you to take your nausea medication as directed.  BELOW ARE SYMPTOMS THAT SHOULD BE REPORTED IMMEDIATELY: *FEVER GREATER THAN 100.4 F (38 C) OR HIGHER *CHILLS OR SWEATING *NAUSEA AND VOMITING THAT IS NOT CONTROLLED WITH YOUR NAUSEA MEDICATION *UNUSUAL SHORTNESS OF BREATH *UNUSUAL BRUISING OR BLEEDING *URINARY PROBLEMS (pain or burning when urinating, or frequent urination) *BOWEL PROBLEMS (unusual diarrhea, constipation, pain near the anus) TENDERNESS IN MOUTH AND THROAT WITH OR WITHOUT PRESENCE OF ULCERS (sore throat, sores in mouth, or a toothache) UNUSUAL RASH, SWELLING OR PAIN  UNUSUAL VAGINAL DISCHARGE OR ITCHING   Items with * indicate a potential emergency and should be followed up as soon as possible or go to the Emergency Department if any problems should occur.  Please show the CHEMOTHERAPY ALERT CARD or IMMUNOTHERAPY ALERT CARD at check-in  to the Emergency Department and triage nurse.  Should you have questions after your visit or need to cancel or reschedule your appointment, please contact [facility] [phone]  and follow the prompts.  Office hours are 8:00 a.m. to 4:30 p.m. Monday - Friday. Please note that voicemails left after 4:00 p.m. may not be returned until the following business day.  We are closed weekends and major holidays. You have access to a nurse at all times for urgent questions. Please call the main number to the clinic [phone] and follow the prompts.  For any non-urgent questions, you may also contact your provider using MyChart. We now offer e-Visits for anyone 32 and older to request care online for non-urgent symptoms. For details visit [email or web address].   Also download the MyChart app! Go to the app store, search "MyChart", open the app, select [facility], and log in with your MyChart username and password.  Due to Covid, a mask is required upon entering the hospital/clinic. If you do not have a mask, one will be given to you upon arrival. For doctor visits, patients may have 1 support person aged 55 or older with them. For treatment visits, patients cannot have anyone with them due to current Covid guidelines and our immunocompromised population.   Bortezomib injection What is this medication? BORTEZOMIB (bor TEZ oh mib) targets proteins in cancer cells and stops the cancer cells from growing. It treats multiple myeloma and mantle cell lymphoma. This medicine may be used for other purposes; ask your health care provider or pharmacist if you have questions. COMMON BRAND NAME(S): Velcade  What should I tell my care team before I take this medication? They need to know if you have any of these conditions: dehydration diabetes (high blood sugar) heart disease liver disease tingling of the fingers or toes or other nerve disorder an unusual or allergic reaction to bortezomib,  mannitol, boron, other medicines, foods, dyes, or preservatives pregnant or trying to get pregnant breast-feeding How should I use this medication? This medicine is injected into a vein or under the skin. It is given by a health care provider in a hospital or clinic setting. Talk to your health care provider about the use of this medicine in children. Special care may be needed. Overdosage: If you think you have taken too much of this medicine contact a poison control center or emergency room at once. NOTE: This medicine is only for you. Do not share this medicine with others. What if I miss a dose? Keep appointments for follow-up doses. It is important not to miss your dose. Call your health care provider if you are unable to keep an appointment. What may interact with this medication? This medicine may interact with the following medications: ketoconazole rifampin This list may not describe all possible interactions. Give your health care provider a list of all the medicines, herbs, non-prescription drugs, or dietary supplements you use. Also tell them if you smoke, drink alcohol, or use illegal drugs. Some items may interact with your medicine. What should I watch for while using this medication? Your condition will be monitored carefully while you are receiving this medicine. You may need blood work done while you are taking this medicine. You may get drowsy or dizzy. Do not drive, use machinery, or do anything that needs mental alertness until you know how this medicine affects you. Do not stand up or sit up quickly, especially if you are an older patient. This reduces the risk of dizzy or fainting spells This medicine may increase your risk of getting an infection. Call your health care provider for advice if you get a fever, chills, sore throat, or other symptoms of a cold or flu. Do not treat yourself. Try to avoid being around people who are sick. Check with your health care provider if  you have severe diarrhea, nausea, and vomiting, or if you sweat a lot. The loss of too much body fluid may make it dangerous for you to take this medicine. Do not become pregnant while taking this medicine or for 7 months after stopping it. Women should inform their health care provider if they wish to become pregnant or think they might be pregnant. Men should not father a child while taking this medicine and for 4 months after stopping it. There is a potential for serious harm to an unborn child. Talk to your health care provider for more information. Do not breast-feed an infant while taking this medicine or for 2 months after stopping it. This medicine may make it more difficult to get pregnant or father a child. Talk to your health care provider if you are concerned about your fertility. What side effects may I notice from receiving this medication? Side effects that you should report to your doctor or health care professional as soon as possible: allergic reactions (skin rash; itching or hives; swelling of the face, lips, or tongue) bleeding (bloody or black, tarry stools; red or dark brown urine; spitting up blood or brown material that looks like coffee grounds; red spots on the skin; unusual bruising or bleeding from the  gums, or nose) blurred vision or changes in vision confusion constipation headache heart failure (trouble breathing; fast, irregular heartbeat; sudden weight gain; swelling of the ankles, feet, hands) infection (fever, chills, cough, sore throat, pain or trouble passing urine) lack or loss of appetite liver injury (dark yellow or brown urine; general ill feeling or flu-like symptoms; loss of appetite, right upper belly pain; yellowing of the eyes or skin) low blood pressure (dizziness; feeling faint or lightheaded, falls; unusually weak or tired) muscle cramps pain, redness, or irritation at site where injected pain, tingling, numbness in the hands or  feet seizures trouble breathing unusual bruising or bleeding Side effects that usually do not require medical attention (report to your doctor or health care professional if they continue or are bothersome): diarrhea nausea stomach pain trouble sleeping vomiting This list may not describe all possible side effects. Call your doctor for medical advice about side effects. You may report side effects to FDA at 1-800-FDA-1088. Where should I keep my medication? This medicine is given in a hospital or clinic. It will not be stored at home. NOTE: This sheet is a summary. It may not cover all possible information. If you have questions about this medicine, talk to your doctor, pharmacist, or health care provider.  2022 Elsevier/Gold Standard (2020-11-22 00:00:00)  

## 2021-10-29 NOTE — Progress Notes (Signed)
Labs and vital signs WNL for treatment today. Tolerated velcade without incidence today.  Stable during and after injection.  AVS reviewed.  Discharged ins table condition via wheelchair.

## 2021-10-30 LAB — PROTEIN ELECTROPHORESIS, SERUM
A/G Ratio: 1 (ref 0.7–1.7)
Albumin ELP: 3.4 g/dL (ref 2.9–4.4)
Alpha-1-Globulin: 0.3 g/dL (ref 0.0–0.4)
Alpha-2-Globulin: 0.8 g/dL (ref 0.4–1.0)
Beta Globulin: 0.9 g/dL (ref 0.7–1.3)
Gamma Globulin: 1.3 g/dL (ref 0.4–1.8)
Globulin, Total: 3.3 g/dL (ref 2.2–3.9)
M-Spike, %: 0.6 g/dL — ABNORMAL HIGH
Total Protein ELP: 6.7 g/dL (ref 6.0–8.5)

## 2021-10-30 LAB — KAPPA/LAMBDA LIGHT CHAINS
Kappa free light chain: 27.6 mg/L — ABNORMAL HIGH (ref 3.3–19.4)
Kappa, lambda light chain ratio: 1.29 (ref 0.26–1.65)
Lambda free light chains: 21.4 mg/L (ref 5.7–26.3)

## 2021-11-04 ENCOUNTER — Other Ambulatory Visit (HOSPITAL_COMMUNITY): Payer: Medicare PPO

## 2021-11-05 ENCOUNTER — Ambulatory Visit (HOSPITAL_COMMUNITY): Payer: Medicare PPO

## 2021-11-11 ENCOUNTER — Inpatient Hospital Stay (HOSPITAL_COMMUNITY): Payer: Medicare PPO

## 2021-11-11 ENCOUNTER — Other Ambulatory Visit (HOSPITAL_COMMUNITY): Payer: Medicare PPO

## 2021-11-11 DIAGNOSIS — C9000 Multiple myeloma not having achieved remission: Secondary | ICD-10-CM | POA: Diagnosis not present

## 2021-11-11 DIAGNOSIS — Z5111 Encounter for antineoplastic chemotherapy: Secondary | ICD-10-CM | POA: Diagnosis not present

## 2021-11-11 LAB — CBC WITH DIFFERENTIAL/PLATELET
Abs Immature Granulocytes: 0.01 10*3/uL (ref 0.00–0.07)
Basophils Absolute: 0 10*3/uL (ref 0.0–0.1)
Basophils Relative: 1 %
Eosinophils Absolute: 0.2 10*3/uL (ref 0.0–0.5)
Eosinophils Relative: 4 %
HCT: 34.5 % — ABNORMAL LOW (ref 36.0–46.0)
Hemoglobin: 10.9 g/dL — ABNORMAL LOW (ref 12.0–15.0)
Immature Granulocytes: 0 %
Lymphocytes Relative: 11 %
Lymphs Abs: 0.5 10*3/uL — ABNORMAL LOW (ref 0.7–4.0)
MCH: 31.1 pg (ref 26.0–34.0)
MCHC: 31.6 g/dL (ref 30.0–36.0)
MCV: 98.6 fL (ref 80.0–100.0)
Monocytes Absolute: 0.5 10*3/uL (ref 0.1–1.0)
Monocytes Relative: 10 %
Neutro Abs: 3.4 10*3/uL (ref 1.7–7.7)
Neutrophils Relative %: 74 %
Platelets: 160 10*3/uL (ref 150–400)
RBC: 3.5 MIL/uL — ABNORMAL LOW (ref 3.87–5.11)
RDW: 16.1 % — ABNORMAL HIGH (ref 11.5–15.5)
WBC: 4.6 10*3/uL (ref 4.0–10.5)
nRBC: 0 % (ref 0.0–0.2)

## 2021-11-11 LAB — COMPREHENSIVE METABOLIC PANEL
ALT: 29 U/L (ref 0–44)
AST: 38 U/L (ref 15–41)
Albumin: 3.3 g/dL — ABNORMAL LOW (ref 3.5–5.0)
Alkaline Phosphatase: 67 U/L (ref 38–126)
Anion gap: 7 (ref 5–15)
BUN: 25 mg/dL — ABNORMAL HIGH (ref 8–23)
CO2: 27 mmol/L (ref 22–32)
Calcium: 9.1 mg/dL (ref 8.9–10.3)
Chloride: 109 mmol/L (ref 98–111)
Creatinine, Ser: 1.23 mg/dL — ABNORMAL HIGH (ref 0.44–1.00)
GFR, Estimated: 45 mL/min — ABNORMAL LOW (ref >60)
Glucose, Bld: 183 mg/dL — ABNORMAL HIGH (ref 70–99)
Potassium: 3.8 mmol/L (ref 3.5–5.1)
Sodium: 143 mmol/L (ref 135–145)
Total Bilirubin: 0.3 mg/dL (ref 0.3–1.2)
Total Protein: 6.3 g/dL — ABNORMAL LOW (ref 6.5–8.1)

## 2021-11-11 LAB — MAGNESIUM: Magnesium: 2.3 mg/dL (ref 1.7–2.4)

## 2021-11-12 ENCOUNTER — Inpatient Hospital Stay (HOSPITAL_COMMUNITY): Payer: Medicare PPO

## 2021-11-12 ENCOUNTER — Ambulatory Visit (HOSPITAL_COMMUNITY): Payer: Medicare PPO

## 2021-11-12 VITALS — BP 143/83 | HR 80 | Temp 98.1°F | Resp 18 | Wt 120.8 lb

## 2021-11-12 DIAGNOSIS — Z5111 Encounter for antineoplastic chemotherapy: Secondary | ICD-10-CM | POA: Diagnosis not present

## 2021-11-12 DIAGNOSIS — C9000 Multiple myeloma not having achieved remission: Secondary | ICD-10-CM | POA: Diagnosis not present

## 2021-11-12 MED ORDER — MAGNESIUM SULFATE 2 GM/50ML IV SOLN
INTRAVENOUS | Status: AC
  Filled 2021-11-12: qty 100

## 2021-11-12 MED ORDER — CYANOCOBALAMIN 1000 MCG/ML IJ SOLN
INTRAMUSCULAR | Status: AC
  Filled 2021-11-12: qty 1

## 2021-11-12 MED ORDER — DIPHENHYDRAMINE HCL 50 MG/ML IJ SOLN
INTRAMUSCULAR | Status: AC
  Filled 2021-11-12: qty 1

## 2021-11-12 MED ORDER — DEXAMETHASONE 4 MG PO TABS
ORAL_TABLET | ORAL | Status: AC
  Filled 2021-11-12: qty 5

## 2021-11-12 MED ORDER — DEXAMETHASONE 4 MG PO TABS
10.0000 mg | ORAL_TABLET | Freq: Once | ORAL | Status: AC
  Administered 2021-11-12: 10 mg via ORAL

## 2021-11-12 MED ORDER — EPOETIN ALFA-EPBX 40000 UNIT/ML IJ SOLN
INTRAMUSCULAR | Status: AC
  Filled 2021-11-12: qty 1

## 2021-11-12 MED ORDER — PALONOSETRON HCL INJECTION 0.25 MG/5ML
INTRAVENOUS | Status: AC
  Filled 2021-11-12: qty 5

## 2021-11-12 MED ORDER — POTASSIUM CHLORIDE CRYS ER 20 MEQ PO TBCR
EXTENDED_RELEASE_TABLET | ORAL | Status: AC
  Filled 2021-11-12: qty 2

## 2021-11-12 MED ORDER — PROCHLORPERAZINE MALEATE 10 MG PO TABS
ORAL_TABLET | ORAL | Status: AC
  Filled 2021-11-12: qty 1

## 2021-11-12 MED ORDER — BORTEZOMIB CHEMO SQ INJECTION 3.5 MG (2.5MG/ML)
1.3000 mg/m2 | Freq: Once | INTRAMUSCULAR | Status: AC
  Administered 2021-11-12: 2 mg via SUBCUTANEOUS
  Filled 2021-11-12: qty 0.8

## 2021-11-12 MED ORDER — DENOSUMAB 120 MG/1.7ML ~~LOC~~ SOLN
SUBCUTANEOUS | Status: AC
  Filled 2021-11-12: qty 1.7

## 2021-11-12 MED ORDER — EPOETIN ALFA-EPBX 20000 UNIT/ML IJ SOLN
INTRAMUSCULAR | Status: AC
  Filled 2021-11-12: qty 1

## 2021-11-12 MED ORDER — DEXAMETHASONE 4 MG PO TABS
ORAL_TABLET | ORAL | Status: AC
  Filled 2021-11-12: qty 3

## 2021-11-12 MED ORDER — FULVESTRANT 250 MG/5ML IM SOSY
PREFILLED_SYRINGE | INTRAMUSCULAR | Status: AC
  Filled 2021-11-12: qty 10

## 2021-11-12 MED ORDER — POTASSIUM CHLORIDE 10 MEQ/100ML IV SOLN
INTRAVENOUS | Status: AC
  Filled 2021-11-12: qty 200

## 2021-11-12 NOTE — Patient Instructions (Signed)
[place]  Discharge Instructions: Thank you for choosing [facility] to provide your oncology and hematology care.  If you have a lab appointment with the [facility], please come in thru the Main Entrance and check in at the main information desk.  Wear comfortable clothing and clothing appropriate for easy access to any Portacath or PICC line.   We strive to give you quality time with your provider. You may need to reschedule your appointment if you arrive late (15 or more minutes).  Arriving late affects you and other patients whose appointments are after yours.  Also, if you miss three or more appointments without notifying the office, you may be dismissed from the clinic at the provider's discretion.      For prescription refill requests, have your pharmacy contact our office and allow 72 hours for refills to be completed.    Today you received the following chemotherapy and/or immunotherapy agents Velcade injection.   To help prevent nausea and vomiting after your treatment, we encourage you to take your nausea medication as directed.  BELOW ARE SYMPTOMS THAT SHOULD BE REPORTED IMMEDIATELY: *FEVER GREATER THAN 100.4 F (38 C) OR HIGHER *CHILLS OR SWEATING *NAUSEA AND VOMITING THAT IS NOT CONTROLLED WITH YOUR NAUSEA MEDICATION *UNUSUAL SHORTNESS OF BREATH *UNUSUAL BRUISING OR BLEEDING *URINARY PROBLEMS (pain or burning when urinating, or frequent urination) *BOWEL PROBLEMS (unusual diarrhea, constipation, pain near the anus) TENDERNESS IN MOUTH AND THROAT WITH OR WITHOUT PRESENCE OF ULCERS (sore throat, sores in mouth, or a toothache) UNUSUAL RASH, SWELLING OR PAIN  UNUSUAL VAGINAL DISCHARGE OR ITCHING   Items with * indicate a potential emergency and should be followed up as soon as possible or go to the Emergency Department if any problems should occur.  Please show the CHEMOTHERAPY ALERT CARD or IMMUNOTHERAPY ALERT CARD at check-in to the Emergency  Department and triage nurse.  Should you have questions after your visit or need to cancel or reschedule your appointment, please contact [facility] [phone]  and follow the prompts.  Office hours are 8:00 a.m. to 4:30 p.m. Monday - Friday. Please note that voicemails left after 4:00 p.m. may not be returned until the following business day.  We are closed weekends and major holidays. You have access to a nurse at all times for urgent questions. Please call the main number to the clinic [phone] and follow the prompts.  For any non-urgent questions, you may also contact your provider using MyChart. We now offer e-Visits for anyone 60 and older to request care online for non-urgent symptoms. For details visit [email or web address].   Also download the MyChart app! Go to the app store, search "MyChart", open the app, select [facility], and log in with your MyChart username and password.  Due to Covid, a mask is required upon entering the hospital/clinic. If you do not have a mask, one will be given to you upon arrival. For doctor visits, patients may have 1 support person aged 3 or older with them. For treatment visits, patients cannot have anyone with them due to current Covid guidelines and our immunocompromised population.

## 2021-11-12 NOTE — Progress Notes (Signed)
.  Mirai M Bagby presents today for injection per the provider's orders.  Velcade administration without incident; injection site WNL; see MAR for injection details.  Patient tolerated procedure well and without incident.  No questions or complaints noted at this time. Okay for treatment today.  Velcade injection given today per MD orders. Tolerated infusion without adverse affects. Vital signs stable. No complaints at this time. Discharged from clinic via wheelchair in stable condition. Alert and oriented x 3. F/U with [facility] as scheduled.

## 2021-11-13 ENCOUNTER — Encounter: Payer: Self-pay | Admitting: Adult Health

## 2021-11-13 ENCOUNTER — Non-Acute Institutional Stay (SKILLED_NURSING_FACILITY): Payer: Medicare PPO | Admitting: Adult Health

## 2021-11-13 DIAGNOSIS — E039 Hypothyroidism, unspecified: Secondary | ICD-10-CM | POA: Diagnosis not present

## 2021-11-13 DIAGNOSIS — I700 Atherosclerosis of aorta: Secondary | ICD-10-CM | POA: Diagnosis not present

## 2021-11-13 DIAGNOSIS — I440 Atrioventricular block, first degree: Secondary | ICD-10-CM | POA: Diagnosis not present

## 2021-11-13 DIAGNOSIS — D696 Thrombocytopenia, unspecified: Secondary | ICD-10-CM | POA: Diagnosis not present

## 2021-11-13 DIAGNOSIS — E119 Type 2 diabetes mellitus without complications: Secondary | ICD-10-CM | POA: Diagnosis not present

## 2021-11-13 DIAGNOSIS — C9000 Multiple myeloma not having achieved remission: Secondary | ICD-10-CM

## 2021-11-13 DIAGNOSIS — M810 Age-related osteoporosis without current pathological fracture: Secondary | ICD-10-CM

## 2021-11-13 DIAGNOSIS — I129 Hypertensive chronic kidney disease with stage 1 through stage 4 chronic kidney disease, or unspecified chronic kidney disease: Secondary | ICD-10-CM

## 2021-11-13 DIAGNOSIS — E1159 Type 2 diabetes mellitus with other circulatory complications: Secondary | ICD-10-CM | POA: Diagnosis not present

## 2021-11-13 DIAGNOSIS — D539 Nutritional anemia, unspecified: Secondary | ICD-10-CM

## 2021-11-13 DIAGNOSIS — E1122 Type 2 diabetes mellitus with diabetic chronic kidney disease: Secondary | ICD-10-CM

## 2021-11-13 DIAGNOSIS — N183 Chronic kidney disease, stage 3 unspecified: Secondary | ICD-10-CM

## 2021-11-13 DIAGNOSIS — F01518 Vascular dementia, unspecified severity, with other behavioral disturbance: Secondary | ICD-10-CM

## 2021-11-13 DIAGNOSIS — K8689 Other specified diseases of pancreas: Secondary | ICD-10-CM | POA: Diagnosis not present

## 2021-11-13 DIAGNOSIS — I152 Hypertension secondary to endocrine disorders: Secondary | ICD-10-CM

## 2021-11-13 NOTE — Progress Notes (Signed)
Provider:Carmine Youngberg, Phylis Bougie, NP  Location [place]   PCP: Gerlene Fee, NP   Extended Emergency Contact Information Primary Emergency Contact: Minerva Ends Address: [address], [place] Phone: [phone] Relation: Sister Secondary Emergency Contact: Maryann Alar Address: [address]          [place], [place] [address] [place] Phone: [phone] Relation: Sister  Codes status: DNR Goals of care: advanced directive information Advanced Directives 11/13/2021  Does Patient Have a Medical Advance Directive? Yes  Type of Advance Directive Out of facility DNR (pink MOST or yellow form);Healthcare Power of Attorney  Does patient want to make changes to medical advance directive? No - Patient declined  Copy of Paul in Chart? Yes - validated most recent copy scanned in chart (See row information)  Would patient like information on creating a medical advance directive? -  Pre-existing out of facility DNR order (yellow form or pink MOST form) Yellow form placed in chart (order not valid for inpatient use)     Allergies  Allergen Reactions   Lotensin [Benazepril]     Chief Complaint  Patient presents with   Medicare Wellness    Annual    HPI  She is a 75 year old long term resident of this facility begin seen for her annual exam. She has not required any hospitalizations or ED visits.  She has had falls without injuries. There are no reports of uncontrolled pain. Her appetite is stable; her weight is stable. She continues to be followed for her chronic illnesses including:   Thrombocytopenia:  Multiple myeloma without achieving remission:  Aortic atherosclerosis:  First degree av block/bradycardia   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HCC)    Dementia (HCC)    Diabetes mellitus without  complication ([place])    H/O partial nephrectomy    Hypokalemia    Hypothyroidism    Impaired cognition    Multiple myeloma (HCC)    Dr Maylon Peppers, [facility]   Thyroid disease    Past Surgical History:  Procedure Laterality Date   ABDOMINAL HYSTERECTOMY     CRANIOTOMY     for lymphoma   YAG LASER APPLICATION Right 3/34/3568   Procedure: YAG LASER APPLICATION;  Surgeon: [name], MD;  Location: [facility];  Service: Ophthalmology;  Laterality: Right;    reports that she has never smoked. She has never used smokeless tobacco. She reports that she does not drink alcohol and does not use drugs. Social History   Tobacco Use   Smoking status: Never   Smokeless tobacco: Never  Vaping Use   Vaping Use: Never used  Substance Use Topics   Alcohol use: No   Drug use: No   Family History  Problem Relation Age of Onset   Depression Mother    Diabetes Mother    Heart disease Father    Cancer - Prostate Brother    Cancer Brother    Cancer Sister     Pertinent  Health Maintenance Due  Topic Date Due   OPHTHALMOLOGY EXAM  02/13/2022   HEMOGLOBIN A1C  03/05/2022   FOOT EXAM  07/15/2022   URINE MICROALBUMIN  09/05/2022   INFLUENZA VACCINE  Completed   DEXA SCAN  Completed   Fall Risk 09/24/2021 10/15/2021 10/22/2021 10/29/2021 11/12/2021  Falls  in the past year? - - - - -  Was there an injury with Fall? - - - - -  Fall Risk Category Calculator - - - - -  Fall Risk Category - - - - -  Patient Fall Risk Level High fall risk High fall risk High fall risk High fall risk High fall risk  Patient at Risk for Falls Due to - - - - -  Fall risk Follow up - - - - -   Depression screen [facility] 2/9 09/24/2021 10/16/2020 08/15/2020  Decreased Interest 0 0 0  Down, Depressed, Hopeless 0 0 0  PHQ - 2 Score 0 0 0  Some recent data might be hidden    Functional Status Survey:     Outpatient Encounter Medications as of 11/13/2021  Medication Sig   acetaminophen (TYLENOL) 500 MG tablet Take 1,000 mg  by mouth every 6 (six) hours as needed for mild pain.   acyclovir (ZOVIRAX) 400 MG tablet TAKE 1 TABLET BY MOUTH TWICE DAILY   aspirin EC 81 MG tablet Take 81 mg by mouth daily.   busPIRone (BUSPAR) 5 MG tablet Take 5 mg by mouth 2 (two) times daily. For anxiety   Calcium Carbonate-Vitamin D (CALCIUM-VITAMIN D3 PO) Take 400 Units by mouth daily in the afternoon.   cholestyramine (QUESTRAN) 4 g packet Take 4 g by mouth 2 (two) times daily between meals.   Lactobacillus Probiotic TABS Take 2 tablets by mouth daily.   levothyroxine (SYNTHROID, LEVOTHROID) 25 MCG tablet Take 25 mcg by mouth daily before breakfast.   lipase/protease/amylase (CREON) 36000 UNITS CPEP capsule 36,000-114,000- 180,000 unit; amt: 2; oral Special Instructions: Take 2 capsules with meals With Meals   lipase/protease/amylase (CREON) 36000 UNITS CPEP capsule 36,000-114,000- 180,000 unit; amt: 1; oral Special Instructions: Take one capsule with snacks if eaten between meals. Twice A Day Between Meals   loperamide (IMODIUM A-D) 2 MG tablet Take 4 mg by mouth 3 (three) times daily. For diarrhea   losartan (COZAAR) 50 MG tablet Take 1 tablet (50 mg total) by mouth daily.   NON FORMULARY Regular diet no dairy products.   NON FORMULARY Wanderguard #[id number] to ankle for safety awareness. Check placement and function qshift. Special Instructions: Check placement and function qshift. Every Shift Day, Evening, Night   potassium chloride SA (KLOR-CON) 20 MEQ tablet Take 20 mEq by mouth daily.    colchicine 0.6 MG tablet Take 0.6 mg by mouth daily.     Vitals:   11/13/21 0934  BP: 120/64  Pulse: 79  Resp: 20  Temp: (!) 97.4 F (36.3 C)  SpO2: 97%  Weight: 118 lb 12.8 oz (53.9 kg)  Height: '5\' 9"'  (1.753 m)   Body mass index is 17.54 kg/m.  DIAGNOSTIC EXAMS   PREVIOUS   08-10-20: t score: -3.758  12-21-20: KUBThere is a mild ileus noted. Mild to moderate increase feces in the colon. No renal stone is seen  12-25-20: KUB   There is a nonspecific bowel gas pattern without obstruction. No residual ileus is noted. Mild increased feces in the colon. No renal stone is seen.  02-05-21: ct of head:  1. No acute intracranial abnormality. Please note if concern for acute infarction MRI would be more sensitive. 2. Similar appearance of bifrontal encephalomalacia consistent with prior infarction/insult  02-05-21: MRI of brain:  No evidence of recent infarction, hemorrhage, or mass. Bifrontal encephalomalacia/gliosis. Additional chronic microvascular ischemic changes. Questioned small left anterior clinoid process calcified meningioma on prior CT imaging  is not well evaluated due to size and lack of contrast.  NO NEW EXAMS.     LABS REVIEWED PREVIOUS    12-25-20: wbc 2.7; hgb 10.1; hct 31.2 mcv 103.0 plt 120; glucose 108; bun 13; creat 1.53; k+ 3.9; na++ 138; ca 8.7 liver normal albumin 3.2 GFR 3 01-01-21: wbc 2.5; hgb 10.6; hct 32.1; mcv 100.6 plt 87; glucose 87; bun 27; creat 1.24; k+ 3.8; na++ 144; ca 9.3 GFR 45; liver normal albumin 3.4  02-05-21: wbc 3.9; hgb 10.2; hct 31.6; mcv 102.9 plt 91; glucose 118; bun 26; creat 1.29; k+ 3.8; na++ 138; ca 8.7 GFR43 liver normal albumin 3.0 urine culture: e-coli 02-07-21: hgb a1c 5.3 04-08-21: chol 142; ldl 66; trig 101; hdl 56 04-09-21: wbc 4.6; hgb 11.3; hct 35.3; mcv 100.6 plt 140; glucose 101; bun 20; creat 1.13; k+ 3.5; na++ 139; ca 8.5 GFR 50; liver normal albumin 3.4  06-18-21: wbc 4.5; hgb 10.6; hct 33.7; mcv 98.5 plt 174; glucose 111; bun 22; creat 1.20 ;k+ 3.7; na++ 141; ca 8.9; GFR 47; liver normal albumin 3.4 07-15-21: wbc 4.7; hgb 11.7; hct 35.9; mcv 97.0 plt 220; glucose 161; bun 21; creat 1.23; k+ 3.9; na++ 138; ca 8.8; GFR 46 liver normal albumin 3.2 uric acid 4.4 08-08-21: urine culture: 70,000 e-coli 08-26-21: wbc 6.3; hgb 11.1; hct 35.3; mcv 99.2 plt 165; glucose 117; bun 16; creat 1.02; k+ 4.0; na++ 141; ca 9.1 GFR 57; liver normal albumin 3.1  09-05-21: hgb a1c 5.9;  urine micro-albumin 7.9 10-07-21: wbc 5.6; hgb 10.5; hct 32.5; mcv 100.3 plt 163; glucose 105; bun 25; creat 1.14; k+ 3.8; na++ 138; ca 8.8; GFR 50 liver normal albumin 3.2  TODAY  10-10-21: chol 153; ldl 81; trig 56; hdl 61; tsh 2.577 11-11-21: wbc 4.6; hgb 10.9; hct 34.5; mcv 98.6 plt 160; glucose 183; bun 25; creat 1.23; k+ 3.8; na++ 143; ca 9.1; GFR 45; liver normal albumin 3.3; mag 2.3    Review of Systems  Constitutional:  Negative for malaise/fatigue.  Respiratory:  Negative for cough and shortness of breath.   Cardiovascular:  Negative for chest pain, palpitations and leg swelling.  Gastrointestinal:  Negative for abdominal pain, constipation and heartburn.  Musculoskeletal:  Negative for back pain, joint pain and myalgias.  Skin: Negative.   Neurological:  Negative for dizziness.  Psychiatric/Behavioral:  The patient is not nervous/anxious.    Physical Exam Constitutional:      General: She is not in acute distress.    Appearance: She is underweight. She is not diaphoretic.  HENT:     Nose: Nose normal.     Mouth/Throat:     Mouth: Mucous membranes are moist.     Pharynx: Oropharynx is clear.  Eyes:     Conjunctiva/sclera: Conjunctivae normal.  Neck:     Thyroid: No thyromegaly.  Cardiovascular:     Rate and Rhythm: Normal rate and regular rhythm.     Pulses: Normal pulses.     Heart sounds: Normal heart sounds.  Pulmonary:     Effort: Pulmonary effort is normal. No respiratory distress.     Breath sounds: Normal breath sounds.  Abdominal:     General: Abdomen is flat. Bowel sounds are normal. There is no distension.     Palpations: Abdomen is soft.     Tenderness: There is no abdominal tenderness.  Musculoskeletal:        General: Normal range of motion.     Cervical back: Neck supple.  Right lower leg: No edema.     Left lower leg: No edema.  Lymphadenopathy:     Cervical: No cervical adenopathy.  Skin:    General: Skin is warm and dry.  Neurological:      Mental Status: She is alert. Mental status is at baseline.  Psychiatric:        Mood and Affect: Mood normal.      ASSESSMENT/ PLAN:  TODAY  Diabetes mellitus  with stage 3 chronic kidney disease and hypertension: is stable hgb a1c 5.9 is on arb and statin  2. Post menopausal osteoporosis t score -3.758 will continue calcium and vitamin d   3. Compression fracture of L1 vertebrae with routine healing subsequent encounter (06/2017) is stable has prn tylenol   4. Thrombocytopenia: is stable plt 160 will monitor   5. Multiple myeloma without achieving remission: is without change: is followed by oncology  6. Aortic atherosclerosis: (ct 08-26-15) will  Monitor   7. First degree av block/bradycardia: is stable has been seen by cardiology will monitor    8. Pancreatic insufficiency: is stable will continue questran 4 gm twice daily; creon 72,000 units with meals and imodium 2 mg three times daily   9. Macrocytic anemia: stable; hgb 10.9; vit B 12: 620; is off iron (due to diarrhea) will monitor   10. Vascular dementia with behavioral disturbance: is stable weight is 118 pounds; will continue buspar 5 mg daily to help with anxiety. Is off aricept   11. Herpes: no recent outbreaks will continue acyclovir 400 mg twice daily   12. Failure to thrive in adult: is stable her weight is 118  pounds; will continue to monitor  13. Hypokalemia: k+ 3.8 will continue k+ 20 meq daily   14. Hypothyroidism, unspecified type: is stable tsh 2.577 will continue synthroid 25 mcg daily   15. Hypertension associated with type 2 diabetes mellitus: is stable b/p 120/64 will continue cozaar 50 mg daily asa 81 mg daily       Ok Edwards NP [facility]  Contact [phone] Monday through Friday 8am- 5pm  After hours call [phone]

## 2021-11-14 ENCOUNTER — Non-Acute Institutional Stay (SKILLED_NURSING_FACILITY): Payer: Medicare PPO | Admitting: Adult Health

## 2021-11-14 ENCOUNTER — Encounter: Payer: Self-pay | Admitting: Adult Health

## 2021-11-14 DIAGNOSIS — F419 Anxiety disorder, unspecified: Secondary | ICD-10-CM

## 2021-11-14 DIAGNOSIS — Z66 Do not resuscitate: Secondary | ICD-10-CM | POA: Diagnosis not present

## 2021-11-14 NOTE — Progress Notes (Signed)
Location:  [place] Room Number: 148-W Place of Service:  SNF (31)   CODE STATUS: DNR  Allergies  Allergen Reactions   Lotensin [Benazepril]     Chief Complaint  Patient presents with   Acute Visit    Anxiety     HPI:  Staff report that for the past couple of weeks she has been more anxious. She is trying to leave facility; tries to find sister while she is at dialysis. She is presently taking buspar 5 mg twice daily which is not effective at this time. The staff is concerned that she will needs  to have her buspar increased.   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[place])    Dementia ([place])    Diabetes mellitus without complication ([place])    H/O partial nephrectomy    Hypokalemia    Hypothyroidism    Impaired cognition    Multiple myeloma (HCC)    Dr Maylon Peppers, [facility]   Thyroid disease     Past Surgical History:  Procedure Laterality Date   ABDOMINAL HYSTERECTOMY     CRANIOTOMY     for lymphoma   YAG LASER APPLICATION Right 0/96/0454   Procedure: YAG LASER APPLICATION;  Surgeon: [name], MD;  Location: [facility];  Service: Ophthalmology;  Laterality: Right;    Social History   Socioeconomic History   Marital status: Single    Spouse name: Not on file   Number of children: Not on file   Years of education: Not on file   Highest education level: Not on file  Occupational History   Occupation: retired   Tobacco Use   Smoking status: Never   Smokeless tobacco: Never  Vaping Use   Vaping Use: Never used  Substance and Sexual Activity   Alcohol use: No   Drug use: No   Sexual activity: Never  Other Topics Concern   Not on file  Social History Narrative   Long term resident of [facility]    Social Determinants of Health   Financial Resource Strain: Not on file  Food Insecurity: Not on file  Transportation Needs: Not on file  Physical Activity: Not on file   Stress: Not on file  Social Connections: Not on file  Intimate Partner Violence: Not on file   Family History  Problem Relation Age of Onset   Depression Mother    Diabetes Mother    Heart disease Father    Cancer - Prostate Brother    Cancer Brother    Cancer Sister       VITAL SIGNS BP 120/64   Pulse 79   Temp (!) 97.4 F (36.3 C)   Resp 20   Ht '5\' 9"'  (1.753 m)   Wt 120 lb 6.4 oz (54.6 kg)   SpO2 97%   BMI 17.78 kg/m   Facility-Administered Encounter Medications as of 11/14/2021  Medication   cyanocobalamin ((VITAMIN B-12)) 1000 MCG/ML injection   cyanocobalamin ((VITAMIN B-12)) 1000 MCG/ML injection   denosumab (XGEVA) 120 MG/1.7ML injection   dexamethasone (DECADRON) 4 MG tablet   dexamethasone (DECADRON) 4 MG tablet   diphenhydrAMINE (BENADRYL) 50 MG/ML injection   diphenhydrAMINE (BENADRYL) 50 MG/ML injection   diphenhydrAMINE (BENADRYL) 50 MG/ML injection   epoetin alfa-epbx (RETACRIT) 09811 UNIT/ML injection   epoetin alfa-epbx (RETACRIT) 91478 UNIT/ML injection   epoetin alfa-epbx (RETACRIT) 29562 UNIT/ML injection   fulvestrant (FASLODEX) 250 MG/5ML injection  magnesium sulfate 2 GM/50ML IVPB   palonosetron (ALOXI) 0.25 MG/5ML injection   palonosetron (ALOXI) 0.25 MG/5ML injection   palonosetron (ALOXI) 0.25 MG/5ML injection   palonosetron (ALOXI) 0.25 MG/5ML injection   potassium chloride 10 MEQ/100ML IVPB   potassium chloride SA (KLOR-CON M) 20 MEQ CR tablet   potassium chloride SA (KLOR-CON M) 20 MEQ CR tablet   prochlorperazine (COMPAZINE) 10 MG tablet   prochlorperazine (COMPAZINE) 10 MG tablet   prochlorperazine (COMPAZINE) tablet 10 mg   Outpatient Encounter Medications as of 11/14/2021  Medication Sig   acetaminophen (TYLENOL) 500 MG tablet Take 1,000 mg by mouth every 6 (six) hours as needed for mild pain.   acyclovir (ZOVIRAX) 400 MG tablet TAKE 1 TABLET BY MOUTH TWICE DAILY   aspirin EC 81 MG tablet Take 81 mg by mouth daily.    busPIRone (BUSPAR) 5 MG tablet Take 5 mg by mouth 2 (two) times daily. For anxiety   Calcium Carbonate-Vitamin D (CALCIUM-VITAMIN D3 PO) Take 400 Units by mouth daily in the afternoon.   cholestyramine (QUESTRAN) 4 g packet Take 4 g by mouth 2 (two) times daily between meals.   Lactobacillus Probiotic TABS Take 2 tablets by mouth daily.   levothyroxine (SYNTHROID, LEVOTHROID) 25 MCG tablet Take 25 mcg by mouth daily before breakfast.   lipase/protease/amylase (CREON) 36000 UNITS CPEP capsule 36,000-114,000- 180,000 unit; amt: 2; oral Special Instructions: Take 2 capsules with meals With Meals   lipase/protease/amylase (CREON) 36000 UNITS CPEP capsule 36,000-114,000- 180,000 unit; amt: 1; oral Special Instructions: Take one capsule with snacks if eaten between meals. Twice A Day Between Meals   loperamide (IMODIUM A-D) 2 MG tablet Take 4 mg by mouth 3 (three) times daily. For diarrhea   losartan (COZAAR) 50 MG tablet Take 1 tablet (50 mg total) by mouth daily.   NON FORMULARY Regular diet no dairy products.   NON FORMULARY Wanderguard #[id number] to ankle for safety awareness. Check placement and function qshift. Special Instructions: Check placement and function qshift. Every Shift Day, Evening, Night   potassium chloride SA (KLOR-CON) 20 MEQ tablet Take 20 mEq by mouth daily.    [DISCONTINUED] colchicine 0.6 MG tablet Take 0.6 mg by mouth daily.     SIGNIFICANT DIAGNOSTIC EXAMS  PREVIOUS   08-10-20: t score: -3.758  12-21-20: KUBThere is a mild ileus noted. Mild to moderate increase feces in the colon. No renal stone is seen  12-25-20: KUB  There is a nonspecific bowel gas pattern without obstruction. No residual ileus is noted. Mild increased feces in the colon. No renal stone is seen.  02-05-21: ct of head:  1. No acute intracranial abnormality. Please note if concern for acute infarction MRI would be more sensitive. 2. Similar appearance of bifrontal encephalomalacia consistent with prior  infarction/insult  02-05-21: MRI of brain:  No evidence of recent infarction, hemorrhage, or mass. Bifrontal encephalomalacia/gliosis. Additional chronic microvascular ischemic changes. Questioned small left anterior clinoid process calcified meningioma on prior CT imaging is not well evaluated due to size and lack of contrast.  NO NEW EXAMS.     LABS REVIEWED PREVIOUS    12-25-20: wbc 2.7; hgb 10.1; hct 31.2 mcv 103.0 plt 120; glucose 108; bun 13; creat 1.53; k+ 3.9; na++ 138; ca 8.7 liver normal albumin 3.2 GFR 3 01-01-21: wbc 2.5; hgb 10.6; hct 32.1; mcv 100.6 plt 87; glucose 87; bun 27; creat 1.24; k+ 3.8; na++ 144; ca 9.3 GFR 45; liver normal albumin 3.4  02-05-21: wbc 3.9; hgb 10.2; hct 31.6;  mcv 102.9 plt 91; glucose 118; bun 26; creat 1.29; k+ 3.8; na++ 138; ca 8.7 GFR43 liver normal albumin 3.0 urine culture: e-coli 02-07-21: hgb a1c 5.3 04-08-21: chol 142; ldl 66; trig 101; hdl 56 04-09-21: wbc 4.6; hgb 11.3; hct 35.3; mcv 100.6 plt 140; glucose 101; bun 20; creat 1.13; k+ 3.5; na++ 139; ca 8.5 GFR 50; liver normal albumin 3.4  06-18-21: wbc 4.5; hgb 10.6; hct 33.7; mcv 98.5 plt 174; glucose 111; bun 22; creat 1.20 ;k+ 3.7; na++ 141; ca 8.9; GFR 47; liver normal albumin 3.4 07-15-21: wbc 4.7; hgb 11.7; hct 35.9; mcv 97.0 plt 220; glucose 161; bun 21; creat 1.23; k+ 3.9; na++ 138; ca 8.8; GFR 46 liver normal albumin 3.2 uric acid 4.4 08-08-21: urine culture: 70,000 e-coli 08-26-21: wbc 6.3; hgb 11.1; hct 35.3; mcv 99.2 plt 165; glucose 117; bun 16; creat 1.02; k+ 4.0; na++ 141; ca 9.1 GFR 57; liver normal albumin 3.1  09-05-21: hgb a1c 5.9; urine micro-albumin 7.9 10-07-21: wbc 5.6; hgb 10.5; hct 32.5; mcv 100.3 plt 163; glucose 105; bun 25; creat 1.14; k+ 3.8; na++ 138; ca 8.8; GFR 50 liver normal albumin 3.2 10-10-21: chol 153; ldl 81; trig 56; hdl 61; tsh 2.577 11-11-21: wbc 4.6; hgb 10.9; hct 34.5; mcv 98.6 plt 160; glucose 183; bun 25; creat 1.23; k+ 3.8; na++ 143; ca 9.1; GFR 45; liver  normal albumin 3.3; mag 2.3    NO NEW LABS.    Review of Systems  Unable to perform ROS: Dementia (unable to participate)   Physical Exam Constitutional:      General: She is not in acute distress.    Appearance: She is underweight. She is not diaphoretic.  Neck:     Thyroid: No thyromegaly.  Cardiovascular:     Rate and Rhythm: Normal rate and regular rhythm.     Pulses: Normal pulses.     Heart sounds: Normal heart sounds.  Pulmonary:     Effort: Pulmonary effort is normal. No respiratory distress.     Breath sounds: Normal breath sounds.  Abdominal:     General: Bowel sounds are normal. There is no distension.     Palpations: Abdomen is soft.     Tenderness: There is no abdominal tenderness.  Musculoskeletal:        General: Normal range of motion.     Cervical back: Neck supple.     Right lower leg: No edema.     Left lower leg: No edema.  Lymphadenopathy:     Cervical: No cervical adenopathy.  Skin:    General: Skin is warm and dry.  Neurological:     Mental Status: She is alert. Mental status is at baseline.  Psychiatric:        Mood and Affect: Mood normal.      ASSESSMENT/ PLAN:  TODAY  Anxiety: is worse; will increase her buspar to 10 mg daily will monitor her status.      Ok Edwards NP [facility]  Contact [phone] Monday through Friday 8am- 5pm  After hours call [phone]

## 2021-11-18 ENCOUNTER — Other Ambulatory Visit (HOSPITAL_COMMUNITY): Payer: Medicare PPO

## 2021-11-18 ENCOUNTER — Inpatient Hospital Stay (HOSPITAL_COMMUNITY): Payer: Medicare PPO | Attending: Hematology

## 2021-11-18 ENCOUNTER — Other Ambulatory Visit: Payer: Self-pay

## 2021-11-18 DIAGNOSIS — E119 Type 2 diabetes mellitus without complications: Secondary | ICD-10-CM | POA: Insufficient documentation

## 2021-11-18 DIAGNOSIS — I10 Essential (primary) hypertension: Secondary | ICD-10-CM | POA: Insufficient documentation

## 2021-11-18 DIAGNOSIS — C9000 Multiple myeloma not having achieved remission: Secondary | ICD-10-CM | POA: Insufficient documentation

## 2021-11-18 DIAGNOSIS — Z5111 Encounter for antineoplastic chemotherapy: Secondary | ICD-10-CM | POA: Diagnosis not present

## 2021-11-18 DIAGNOSIS — Z923 Personal history of irradiation: Secondary | ICD-10-CM | POA: Insufficient documentation

## 2021-11-18 DIAGNOSIS — F0390 Unspecified dementia without behavioral disturbance: Secondary | ICD-10-CM | POA: Insufficient documentation

## 2021-11-18 DIAGNOSIS — Z8572 Personal history of non-Hodgkin lymphomas: Secondary | ICD-10-CM | POA: Diagnosis not present

## 2021-11-18 DIAGNOSIS — E039 Hypothyroidism, unspecified: Secondary | ICD-10-CM | POA: Insufficient documentation

## 2021-11-18 LAB — CBC WITH DIFFERENTIAL/PLATELET
Abs Immature Granulocytes: 0.01 10*3/uL (ref 0.00–0.07)
Basophils Absolute: 0 10*3/uL (ref 0.0–0.1)
Basophils Relative: 1 %
Eosinophils Absolute: 0.2 10*3/uL (ref 0.0–0.5)
Eosinophils Relative: 5 %
HCT: 34.3 % — ABNORMAL LOW (ref 36.0–46.0)
Hemoglobin: 11.1 g/dL — ABNORMAL LOW (ref 12.0–15.0)
Immature Granulocytes: 0 %
Lymphocytes Relative: 14 %
Lymphs Abs: 0.6 10*3/uL — ABNORMAL LOW (ref 0.7–4.0)
MCH: 32.4 pg (ref 26.0–34.0)
MCHC: 32.4 g/dL (ref 30.0–36.0)
MCV: 100 fL (ref 80.0–100.0)
Monocytes Absolute: 0.5 10*3/uL (ref 0.1–1.0)
Monocytes Relative: 12 %
Neutro Abs: 2.7 10*3/uL (ref 1.7–7.7)
Neutrophils Relative %: 68 %
Platelets: 103 10*3/uL — ABNORMAL LOW (ref 150–400)
RBC: 3.43 MIL/uL — ABNORMAL LOW (ref 3.87–5.11)
RDW: 15.8 % — ABNORMAL HIGH (ref 11.5–15.5)
WBC: 4 10*3/uL (ref 4.0–10.5)
nRBC: 0 % (ref 0.0–0.2)

## 2021-11-18 LAB — COMPREHENSIVE METABOLIC PANEL
ALT: 17 U/L (ref 0–44)
AST: 29 U/L (ref 15–41)
Albumin: 3.3 g/dL — ABNORMAL LOW (ref 3.5–5.0)
Alkaline Phosphatase: 62 U/L (ref 38–126)
Anion gap: 5 (ref 5–15)
BUN: 18 mg/dL (ref 8–23)
CO2: 26 mmol/L (ref 22–32)
Calcium: 8.6 mg/dL — ABNORMAL LOW (ref 8.9–10.3)
Chloride: 107 mmol/L (ref 98–111)
Creatinine, Ser: 1.11 mg/dL — ABNORMAL HIGH (ref 0.44–1.00)
GFR, Estimated: 51 mL/min — ABNORMAL LOW (ref >60)
Glucose, Bld: 102 mg/dL — ABNORMAL HIGH (ref 70–99)
Potassium: 3.7 mmol/L (ref 3.5–5.1)
Sodium: 138 mmol/L (ref 135–145)
Total Bilirubin: 0.3 mg/dL (ref 0.3–1.2)
Total Protein: 6.3 g/dL — ABNORMAL LOW (ref 6.5–8.1)

## 2021-11-18 LAB — MAGNESIUM: Magnesium: 2.1 mg/dL (ref 1.7–2.4)

## 2021-11-18 NOTE — Progress Notes (Signed)
[place], [place] [address]   CLINIC:  Medical Oncology/Hematology  PCP:  Gerlene Fee, NP [address] [place] [address] [phone]   REASON FOR VISIT:  Follow-up for multiple myeloma  PRIOR THERAPY: none  NGS Results: not done  CURRENT THERAPY: Velcade & Decadron 3/4 weeks; Revlimid 2/4 weeks  BRIEF ONCOLOGIC HISTORY:  Oncology History  Multiple myeloma ([facility])  07/09/2017 Initial Diagnosis   Multiple myeloma ([facility])   06/01/2018 -  Chemotherapy   Patient is on Treatment Plan : MYELOMA MAINTENANCE Bortezomib SQ D1,8,15 / Dexamethasone Oral D1,8,15 q28d        CANCER STAGING:  Cancer Staging  No matching staging information was found for the patient.  INTERVAL HISTORY:  [name], a 75 y.o. female, returns for routine follow-up and consideration for next cycle of chemotherapy. Amanda Wu was last seen on 09/10/2021.  Due for day #8 cycle #45 of Velcade today.   Overall, she tells me she has been feeling pretty well. She denies tingling/numbness and diarrhea.   Overall, she feels ready for next cycle of chemo today.   REVIEW OF SYSTEMS:  Review of Systems  Constitutional:  Negative for appetite change and fatigue.  Gastrointestinal:  Negative for diarrhea.  Neurological:  Negative for numbness.  All other systems reviewed and are negative.  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[facility])    Dementia ([facility])    Diabetes mellitus without complication ([facility])    H/O partial nephrectomy    Hypokalemia    Hypothyroidism    Impaired cognition    Multiple myeloma ([facility])    Dr Maylon Peppers, [facility]   Thyroid disease    Past Surgical History:  Procedure Laterality Date   ABDOMINAL HYSTERECTOMY     CRANIOTOMY     for lymphoma   YAG LASER APPLICATION Right 0/35/0093   Procedure: YAG LASER APPLICATION;  Surgeon: [name], MD;  Location: [facility];  Service: Ophthalmology;  Laterality: Right;    SOCIAL HISTORY:  Social History   Socioeconomic History   Marital status: Single    Spouse name: Not on file   Number of children: Not on file   Years of education: Not on file   Highest education level: Not on file  Occupational History   Occupation: retired   Tobacco Use   Smoking status: Never   Smokeless tobacco: Never  Vaping Use   Vaping Use: Never used  Substance and Sexual Activity   Alcohol use: No   Drug use: No   Sexual activity: Never  Other Topics Concern   Not on file  Social History Narrative   Long term resident of [facility]    Social Determinants of Health   Financial Resource Strain: Not on file  Food Insecurity: Not on file  Transportation Needs: Not on file  Physical Activity: Not on file  Stress: Not on file  Social Connections: Not on file  Intimate Partner Violence: Not on file    FAMILY HISTORY:  Family History  Problem Relation Age of Onset   Depression Mother    Diabetes Mother    Heart disease Father    Cancer - Prostate Brother    Cancer Brother    Cancer Sister     CURRENT MEDICATIONS:  No current facility-administered medications for this visit.   No current  outpatient medications on file.   Facility-Administered Medications Ordered in Other Visits  Medication Dose Route Frequency Provider Last Rate Last Admin   cyanocobalamin ((VITAMIN B-12)) 1000 MCG/ML injection            cyanocobalamin ((VITAMIN B-12)) 1000 MCG/ML injection            denosumab (XGEVA) 120 MG/1.7ML injection            dexamethasone (DECADRON) 4 MG tablet            dexamethasone (DECADRON) 4 MG tablet            diphenhydrAMINE (BENADRYL) 50 MG/ML injection            diphenhydrAMINE (BENADRYL) 50 MG/ML injection            diphenhydrAMINE (BENADRYL) 50 MG/ML injection            epoetin alfa-epbx (RETACRIT) 13086 UNIT/ML injection            epoetin alfa-epbx (RETACRIT)  20000 UNIT/ML injection            epoetin alfa-epbx (RETACRIT) 57846 UNIT/ML injection            fulvestrant (FASLODEX) 250 MG/5ML injection            magnesium sulfate 2 GM/50ML IVPB            palonosetron (ALOXI) 0.25 MG/5ML injection            palonosetron (ALOXI) 0.25 MG/5ML injection            palonosetron (ALOXI) 0.25 MG/5ML injection            palonosetron (ALOXI) 0.25 MG/5ML injection            potassium chloride 10 MEQ/100ML IVPB            potassium chloride SA (KLOR-CON M) 20 MEQ CR tablet            potassium chloride SA (KLOR-CON M) 20 MEQ CR tablet            prochlorperazine (COMPAZINE) 10 MG tablet            prochlorperazine (COMPAZINE) 10 MG tablet            prochlorperazine (COMPAZINE) tablet 10 mg  10 mg Oral Once Derek Jack, MD        ALLERGIES:  Allergies  Allergen Reactions   Lotensin [Benazepril]     PHYSICAL EXAM:  Performance status (ECOG): 2 - Symptomatic, <50% confined to bed  Vitals:   11/19/21 1111 11/19/21 1119  BP: (!) 158/94 (!) 143/88  Pulse: 77   Resp: 18   Temp: 98.4 F (36.9 C)   SpO2: 100%    Wt Readings from Last 3 Encounters:  11/19/21 119 lb 1.6 oz (54 kg)  11/14/21 120 lb 6.4 oz (54.6 kg)  11/13/21 118 lb 12.8 oz (53.9 kg)   Physical Exam Vitals reviewed.  Constitutional:      Appearance: Normal appearance.  Cardiovascular:     Rate and Rhythm: Normal rate and regular rhythm.     Pulses: Normal pulses.     Heart sounds: Normal heart sounds.  Pulmonary:     Effort: Pulmonary effort is normal.     Breath sounds: Normal breath sounds.  Musculoskeletal:     Right lower leg: No edema.     Left lower leg: No edema.  Neurological:     General: No focal deficit present.  Mental Status: She is alert and oriented to person, place, and time.  Psychiatric:        Mood and Affect: Mood normal.        Behavior: Behavior normal.    LABORATORY DATA:  I have reviewed the labs as listed.  CBC Latest Ref Rng &  Units 11/18/2021 11/11/2021 10/28/2021  WBC 4.0 - 10.5 K/uL 4.0 4.6 4.9  Hemoglobin 12.0 - 15.0 g/dL 11.1(L) 10.9(L) 11.9(L)  Hematocrit 36.0 - 46.0 % 34.3(L) 34.5(L) 35.8(L)  Platelets 150 - 400 K/uL 103(L) 160 119(L)   CMP Latest Ref Rng & Units 11/18/2021 11/11/2021 10/28/2021  Glucose 70 - 99 mg/dL 102(H) 183(H) 148(H)  BUN 8 - 23 mg/dL 18 25(H) 17  Creatinine 0.44 - 1.00 mg/dL 1.11(H) 1.23(H) 1.22(H)  Sodium 135 - 145 mmol/L 138 143 142  Potassium 3.5 - 5.1 mmol/L 3.7 3.8 3.9  Chloride 98 - 111 mmol/L 107 109 108  CO2 22 - 32 mmol/L _0 Calcium 8.9 - 10.3 mg/dL 8.6(L) 9.1 9.0  Total Protein 6.5 - 8.1 g/dL 6.3(L) 6.3(L) 7.0  Total Bilirubin 0.3 - 1.2 mg/dL 0.3 0.3 0.7  Alkaline Phos 38 - 126 U/L 62 67 67  AST 15 - 41 U/L 29 38 60(H)  ALT 0 - 44 U/L 17 29 36    DIAGNOSTIC IMAGING:  I have independently reviewed the scans and discussed with the patient. No results found.   ASSESSMENT:  1.  IgG lambda multiple myeloma: -She is on Revlimid 10 mg 2 weeks on/2 weeks of along with Velcade 3 weeks on/1 week off.  Dexamethasone 10 mg on days of Velcade. -Myeloma panel on 04/17/2020 shows M spike 0.5 g.  Kappa light chains are 44.  Lambda light chains are 34.  Ratio is 1.3. -Resident of [facility] since 07/19/2020.   2.  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   PLAN:  1.  IgG lambda multiple myeloma: -We reviewed myeloma labs from 10/28/2021.  M spike is stable at 0.6 g.  Free light chain ratio is stable and normal at 1.29.  Kappa light chains at 27.9. - She is tolerating Velcade very well.  No neuropathy reported.  No GI symptoms. - Reviewed labs today which showed normal white count and platelet count slightly low at 103.  LFTs are normal. - Continue Velcade on days 1, 8, 15 every 28 days with dexamethasone 10 mg on treatment days. - RTC 8 weeks for follow-up.   2.  CKD: -Creatinine today is 1.11.  Baseline is 1.2-1.3.   3.   Diarrhea: -This is no longer a problem.   4.  Dementia: -Continue Aricept 10 mg daily.   5.  Shingles prophylaxis: -Continue acyclovir 400 mg twice daily.   6.  Myeloma bone disease: -Zometa was given for several years which was discontinued.   7.  Normocytic anemia: -This is from myelosuppression from treatments and CKD.  Hemoglobin 11.1.     Orders placed this encounter:  No orders of the defined types were placed in this encounter.    Derek Jack, MD [facility] [phone]   I, Thana Ates, am acting as a scribe for Dr. Derek Jack.  I, Derek Jack MD, have reviewed the above documentation for accuracy and completeness, and I agree with the above.

## 2021-11-19 ENCOUNTER — Ambulatory Visit (HOSPITAL_COMMUNITY): Payer: Medicare PPO

## 2021-11-19 ENCOUNTER — Ambulatory Visit (HOSPITAL_COMMUNITY): Payer: Medicare PPO | Admitting: Hematology

## 2021-11-19 ENCOUNTER — Inpatient Hospital Stay (HOSPITAL_COMMUNITY): Payer: Medicare PPO

## 2021-11-19 ENCOUNTER — Encounter (HOSPITAL_COMMUNITY): Payer: Self-pay | Admitting: Hematology

## 2021-11-19 ENCOUNTER — Inpatient Hospital Stay (HOSPITAL_BASED_OUTPATIENT_CLINIC_OR_DEPARTMENT_OTHER): Payer: Medicare PPO | Admitting: Hematology

## 2021-11-19 VITALS — BP 143/88 | HR 77 | Temp 98.4°F | Resp 18 | Ht 69.0 in | Wt 119.1 lb

## 2021-11-19 DIAGNOSIS — Z923 Personal history of irradiation: Secondary | ICD-10-CM | POA: Diagnosis not present

## 2021-11-19 DIAGNOSIS — E039 Hypothyroidism, unspecified: Secondary | ICD-10-CM | POA: Diagnosis not present

## 2021-11-19 DIAGNOSIS — C9000 Multiple myeloma not having achieved remission: Secondary | ICD-10-CM | POA: Diagnosis not present

## 2021-11-19 DIAGNOSIS — E119 Type 2 diabetes mellitus without complications: Secondary | ICD-10-CM | POA: Diagnosis not present

## 2021-11-19 DIAGNOSIS — Z8572 Personal history of non-Hodgkin lymphomas: Secondary | ICD-10-CM | POA: Diagnosis not present

## 2021-11-19 DIAGNOSIS — Z5111 Encounter for antineoplastic chemotherapy: Secondary | ICD-10-CM | POA: Diagnosis not present

## 2021-11-19 DIAGNOSIS — I10 Essential (primary) hypertension: Secondary | ICD-10-CM | POA: Diagnosis not present

## 2021-11-19 DIAGNOSIS — F0390 Unspecified dementia without behavioral disturbance: Secondary | ICD-10-CM | POA: Diagnosis not present

## 2021-11-19 MED ORDER — DEXAMETHASONE 4 MG PO TABS
10.0000 mg | ORAL_TABLET | Freq: Once | ORAL | Status: AC
  Administered 2021-11-19: 10 mg via ORAL
  Filled 2021-11-19: qty 3

## 2021-11-19 MED ORDER — BORTEZOMIB CHEMO SQ INJECTION 3.5 MG (2.5MG/ML)
1.3000 mg/m2 | Freq: Once | INTRAMUSCULAR | Status: AC
  Administered 2021-11-19: 2 mg via SUBCUTANEOUS
  Filled 2021-11-19: qty 0.8

## 2021-11-19 NOTE — Patient Instructions (Signed)
[place] at [facility] Discharge Instructions  You were seen and examined today by Dr. Delton Coombes. He reviewed your most recent labs and everything looks okay. You received your treatment today. Please keep follow up as scheduled.   Thank you for choosing [place] at [facility] to provide your oncology and hematology care.  To afford each patient quality time with our provider, please arrive at least 15 minutes before your scheduled appointment time.   If you have a lab appointment with the [facility] please come in thru the Main Entrance and check in at the main information desk.  You need to re-schedule your appointment should you arrive 10 or more minutes late.  We strive to give you quality time with our providers, and arriving late affects you and other patients whose appointments are after yours.  Also, if you no show three or more times for appointments you may be dismissed from the clinic at the providers discretion.     Again, thank you for choosing [facility].  Our hope is that these requests will decrease the amount of time that you wait before being seen by our physicians.       _____________________________________________________________  Should you have questions after your visit to [facility], please contact our office at [phone] and follow the prompts.  Our office hours are 8:00 a.m. and 4:30 p.m. Monday - Friday.  Please note that voicemails left after 4:00 p.m. may not be returned until the following business day.  We are closed weekends and major holidays.  You do have access to a nurse 24-7, just call the main number to the clinic [phone] and do not press any options, hold on the line and a nurse will answer the phone.    For prescription refill requests, have your pharmacy contact our office and allow 72 hours.    Due to Covid, you will need to wear a mask upon entering the hospital.  If you do not have a mask, a mask will be given to you at the Main Entrance upon arrival. For doctor visits, patients may have 1 support person age 15 or older with them. For treatment visits, patients can not have anyone with them due to social distancing guidelines and our immunocompromised population.

## 2021-11-19 NOTE — Progress Notes (Signed)
Patient has been assessed, vital signs and labs have been reviewed by Dr. Katragadda. ANC, Creatinine, LFTs, and Platelets are within treatment parameters per Dr. Katragadda. The patient is good to proceed with treatment at this time. Primary RN and pharmacy aware.  

## 2021-11-19 NOTE — Progress Notes (Signed)
Patient presents today for Velcade injection.  Patient is in satisfactory condition with no complaints voiced.  Vital signs are stable.  Labs reviewed by Dr. Delton Coombes during her office visit.  All labs are within treatment parameters. We will proceed with injection per MD orders.   Patient tolerated injection with no complaints voiced.  Site clean and dry with no bruising or swelling noted.  No complaints of pain.  Discharged with vital signs stable and no signs or symptoms of distress noted.

## 2021-11-19 NOTE — Patient Instructions (Signed)
[facility]  Discharge Instructions: Thank you for choosing [facility] to provide your oncology and hematology care.  If you have a lab appointment with the Cancer Center, please come in thru the Main Entrance and check in at the main information desk.  Wear comfortable clothing and clothing appropriate for easy access to any Portacath or PICC line.   We strive to give you quality time with your provider. You may need to reschedule your appointment if you arrive late (15 or more minutes).  Arriving late affects you and other patients whose appointments are after yours.  Also, if you miss three or more appointments without notifying the office, you may be dismissed from the clinic at the provider's discretion.      For prescription refill requests, have your pharmacy contact our office and allow 72 hours for refills to be completed.        To help prevent nausea and vomiting after your treatment, we encourage you to take your nausea medication as directed.  BELOW ARE SYMPTOMS THAT SHOULD BE REPORTED IMMEDIATELY: *FEVER GREATER THAN 100.4 F (38 C) OR HIGHER *CHILLS OR SWEATING *NAUSEA AND VOMITING THAT IS NOT CONTROLLED WITH YOUR NAUSEA MEDICATION *UNUSUAL SHORTNESS OF BREATH *UNUSUAL BRUISING OR BLEEDING *URINARY PROBLEMS (pain or burning when urinating, or frequent urination) *BOWEL PROBLEMS (unusual diarrhea, constipation, pain near the anus) TENDERNESS IN MOUTH AND THROAT WITH OR WITHOUT PRESENCE OF ULCERS (sore throat, sores in mouth, or a toothache) UNUSUAL RASH, SWELLING OR PAIN  UNUSUAL VAGINAL DISCHARGE OR ITCHING   Items with * indicate a potential emergency and should be followed up as soon as possible or go to the Emergency Department if any problems should occur.  Please show the CHEMOTHERAPY ALERT CARD or IMMUNOTHERAPY ALERT CARD at check-in to the Emergency Department and triage nurse.  Should you have questions after your visit or need to cancel  or reschedule your appointment, please contact [facility] [phone]  and follow the prompts.  Office hours are 8:00 a.m. to 4:30 p.m. Monday - Friday. Please note that voicemails left after 4:00 p.m. may not be returned until the following business day.  We are closed weekends and major holidays. You have access to a nurse at all times for urgent questions. Please call the main number to the clinic [phone] and follow the prompts.  For any non-urgent questions, you may also contact your provider using MyChart. We now offer e-Visits for anyone 18 and older to request care online for non-urgent symptoms. For details visit [email or web address].   Also download the MyChart app! Go to the app store, search "MyChart", open the app, select [facility], and log in with your MyChart username and password.  Due to Covid, a mask is required upon entering the hospital/clinic. If you do not have a mask, one will be given to you upon arrival. For doctor visits, patients may have 1 support person aged 18 or older with them. For treatment visits, patients cannot have anyone with them due to current Covid guidelines and our immunocompromised population.  

## 2021-11-25 ENCOUNTER — Inpatient Hospital Stay (HOSPITAL_COMMUNITY): Payer: Medicare PPO

## 2021-11-25 DIAGNOSIS — Z923 Personal history of irradiation: Secondary | ICD-10-CM | POA: Diagnosis not present

## 2021-11-25 DIAGNOSIS — F0390 Unspecified dementia without behavioral disturbance: Secondary | ICD-10-CM | POA: Diagnosis not present

## 2021-11-25 DIAGNOSIS — E119 Type 2 diabetes mellitus without complications: Secondary | ICD-10-CM | POA: Diagnosis not present

## 2021-11-25 DIAGNOSIS — E039 Hypothyroidism, unspecified: Secondary | ICD-10-CM | POA: Diagnosis not present

## 2021-11-25 DIAGNOSIS — C9000 Multiple myeloma not having achieved remission: Secondary | ICD-10-CM

## 2021-11-25 DIAGNOSIS — I10 Essential (primary) hypertension: Secondary | ICD-10-CM | POA: Diagnosis not present

## 2021-11-25 DIAGNOSIS — Z5111 Encounter for antineoplastic chemotherapy: Secondary | ICD-10-CM | POA: Diagnosis not present

## 2021-11-25 DIAGNOSIS — Z8572 Personal history of non-Hodgkin lymphomas: Secondary | ICD-10-CM | POA: Diagnosis not present

## 2021-11-25 LAB — CBC WITH DIFFERENTIAL/PLATELET
Abs Immature Granulocytes: 0 10*3/uL (ref 0.00–0.07)
Basophils Absolute: 0 10*3/uL (ref 0.0–0.1)
Basophils Relative: 1 %
Eosinophils Absolute: 0.1 10*3/uL (ref 0.0–0.5)
Eosinophils Relative: 3 %
HCT: 34.5 % — ABNORMAL LOW (ref 36.0–46.0)
Hemoglobin: 11.6 g/dL — ABNORMAL LOW (ref 12.0–15.0)
Immature Granulocytes: 0 %
Lymphocytes Relative: 14 %
Lymphs Abs: 0.6 10*3/uL — ABNORMAL LOW (ref 0.7–4.0)
MCH: 32.2 pg (ref 26.0–34.0)
MCHC: 33.6 g/dL (ref 30.0–36.0)
MCV: 95.8 fL (ref 80.0–100.0)
Monocytes Absolute: 0.4 10*3/uL (ref 0.1–1.0)
Monocytes Relative: 10 %
Neutro Abs: 3.2 10*3/uL (ref 1.7–7.7)
Neutrophils Relative %: 72 %
Platelets: 110 10*3/uL — ABNORMAL LOW (ref 150–400)
RBC: 3.6 MIL/uL — ABNORMAL LOW (ref 3.87–5.11)
RDW: 15.7 % — ABNORMAL HIGH (ref 11.5–15.5)
WBC: 4.5 10*3/uL (ref 4.0–10.5)
nRBC: 0 % (ref 0.0–0.2)

## 2021-11-25 LAB — LACTATE DEHYDROGENASE: LDH: 210 U/L — ABNORMAL HIGH (ref 98–192)

## 2021-11-25 LAB — COMPREHENSIVE METABOLIC PANEL
ALT: 20 U/L (ref 0–44)
AST: 28 U/L (ref 15–41)
Albumin: 3.4 g/dL — ABNORMAL LOW (ref 3.5–5.0)
Alkaline Phosphatase: 59 U/L (ref 38–126)
Anion gap: 8 (ref 5–15)
BUN: 20 mg/dL (ref 8–23)
CO2: 24 mmol/L (ref 22–32)
Calcium: 9 mg/dL (ref 8.9–10.3)
Chloride: 108 mmol/L (ref 98–111)
Creatinine, Ser: 1.35 mg/dL — ABNORMAL HIGH (ref 0.44–1.00)
GFR, Estimated: 40 mL/min — ABNORMAL LOW (ref >60)
Glucose, Bld: 120 mg/dL — ABNORMAL HIGH (ref 70–99)
Potassium: 3.3 mmol/L — ABNORMAL LOW (ref 3.5–5.1)
Sodium: 140 mmol/L (ref 135–145)
Total Bilirubin: 0.5 mg/dL (ref 0.3–1.2)
Total Protein: 6.6 g/dL (ref 6.5–8.1)

## 2021-11-25 LAB — MAGNESIUM: Magnesium: 2.2 mg/dL (ref 1.7–2.4)

## 2021-11-26 ENCOUNTER — Inpatient Hospital Stay (HOSPITAL_COMMUNITY): Payer: Medicare PPO

## 2021-11-26 ENCOUNTER — Encounter (HOSPITAL_COMMUNITY): Payer: Self-pay

## 2021-11-26 VITALS — BP 142/88 | HR 70 | Temp 98.7°F | Resp 18 | Ht 69.0 in | Wt 118.2 lb

## 2021-11-26 DIAGNOSIS — Z923 Personal history of irradiation: Secondary | ICD-10-CM | POA: Diagnosis not present

## 2021-11-26 DIAGNOSIS — Z5111 Encounter for antineoplastic chemotherapy: Secondary | ICD-10-CM | POA: Diagnosis not present

## 2021-11-26 DIAGNOSIS — C9000 Multiple myeloma not having achieved remission: Secondary | ICD-10-CM | POA: Diagnosis not present

## 2021-11-26 DIAGNOSIS — Z8572 Personal history of non-Hodgkin lymphomas: Secondary | ICD-10-CM | POA: Diagnosis not present

## 2021-11-26 DIAGNOSIS — E1151 Type 2 diabetes mellitus with diabetic peripheral angiopathy without gangrene: Secondary | ICD-10-CM | POA: Diagnosis not present

## 2021-11-26 DIAGNOSIS — E039 Hypothyroidism, unspecified: Secondary | ICD-10-CM | POA: Diagnosis not present

## 2021-11-26 DIAGNOSIS — I10 Essential (primary) hypertension: Secondary | ICD-10-CM | POA: Diagnosis not present

## 2021-11-26 DIAGNOSIS — Z794 Long term (current) use of insulin: Secondary | ICD-10-CM | POA: Diagnosis not present

## 2021-11-26 DIAGNOSIS — F0390 Unspecified dementia without behavioral disturbance: Secondary | ICD-10-CM | POA: Diagnosis not present

## 2021-11-26 DIAGNOSIS — E119 Type 2 diabetes mellitus without complications: Secondary | ICD-10-CM | POA: Diagnosis not present

## 2021-11-26 DIAGNOSIS — L602 Onychogryphosis: Secondary | ICD-10-CM | POA: Diagnosis not present

## 2021-11-26 LAB — KAPPA/LAMBDA LIGHT CHAINS
Kappa free light chain: 22 mg/L — ABNORMAL HIGH (ref 3.3–19.4)
Kappa, lambda light chain ratio: 1.15 (ref 0.26–1.65)
Lambda free light chains: 19.2 mg/L (ref 5.7–26.3)

## 2021-11-26 MED ORDER — DEXAMETHASONE 4 MG PO TABS
10.0000 mg | ORAL_TABLET | Freq: Once | ORAL | Status: AC
  Administered 2021-11-26: 10 mg via ORAL
  Filled 2021-11-26: qty 3

## 2021-11-26 MED ORDER — BORTEZOMIB CHEMO SQ INJECTION 3.5 MG (2.5MG/ML)
1.3000 mg/m2 | Freq: Once | INTRAMUSCULAR | Status: AC
  Administered 2021-11-26: 2 mg via SUBCUTANEOUS
  Filled 2021-11-26: qty 0.8

## 2021-11-26 NOTE — Progress Notes (Signed)
Patient tolerated Velcade injection with no complaints voiced. Lab work reviewed. See MAR for details. Injection site clean and dry with no bruising or swelling noted. Patient stable during and after injection. Band aid applied. VSS. Patient left in satisfactory condition with no s/s of distress noted. 

## 2021-11-26 NOTE — Patient Instructions (Signed)
[facility]  Discharge Instructions: Thank you for choosing [facility] to provide your oncology and hematology care.  If you have a lab appointment with the [facility], please come in thru the Main Entrance and check in at the main information desk.  Wear comfortable clothing and clothing appropriate for easy access to any Portacath or PICC line.   We strive to give you quality time with your provider. You may need to reschedule your appointment if you arrive late (15 or more minutes).  Arriving late affects you and other patients whose appointments are after yours.  Also, if you miss three or more appointments without notifying the office, you may be dismissed from the clinic at the providers discretion.      For prescription refill requests, have your pharmacy contact our office and allow 72 hours for refills to be completed.    Today you received the following chemotherapy and/or immunotherapy agents velcade.    To help prevent nausea and vomiting after your treatment, we encourage you to take your nausea medication as directed.  BELOW ARE SYMPTOMS THAT SHOULD BE REPORTED IMMEDIATELY: *FEVER GREATER THAN 100.4 F (38 C) OR HIGHER *CHILLS OR SWEATING *NAUSEA AND VOMITING THAT IS NOT CONTROLLED WITH YOUR NAUSEA MEDICATION *UNUSUAL SHORTNESS OF BREATH *UNUSUAL BRUISING OR BLEEDING *URINARY PROBLEMS (pain or burning when urinating, or frequent urination) *BOWEL PROBLEMS (unusual diarrhea, constipation, pain near the anus) TENDERNESS IN MOUTH AND THROAT WITH OR WITHOUT PRESENCE OF ULCERS (sore throat, sores in mouth, or a toothache) UNUSUAL RASH, SWELLING OR PAIN  UNUSUAL VAGINAL DISCHARGE OR ITCHING   Items with * indicate a potential emergency and should be followed up as soon as possible or go to the Emergency Department if any problems should occur.  Please show the CHEMOTHERAPY ALERT CARD or IMMUNOTHERAPY ALERT CARD at check-in to the Emergency  Department and triage nurse.  Should you have questions after your visit or need to cancel or reschedule your appointment, please contact [facility] [phone]  and follow the prompts.  Office hours are 8:00 a.m. to 4:30 p.m. Monday - Friday. Please note that voicemails left after 4:00 p.m. may not be returned until the following business day.  We are closed weekends and major holidays. You have access to a nurse at all times for urgent questions. Please call the main number to the clinic [phone] and follow the prompts.  For any non-urgent questions, you may also contact your provider using MyChart. We now offer e-Visits for anyone 29 and older to request care online for non-urgent symptoms. For details visit [email or web address].   Also download the MyChart app! Go to the app store, search "MyChart", open the app, select [facility], and log in with your MyChart username and password.  Due to Covid, a mask is required upon entering the hospital/clinic. If you do not have a mask, one will be given to you upon arrival. For doctor visits, patients may have 1 support person aged 70 or older with them. For treatment visits, patients cannot have anyone with them due to current Covid guidelines and our immunocompromised population.

## 2021-11-27 LAB — PROTEIN ELECTROPHORESIS, SERUM
A/G Ratio: 1.1 (ref 0.7–1.7)
Albumin ELP: 3.3 g/dL (ref 2.9–4.4)
Alpha-1-Globulin: 0.2 g/dL (ref 0.0–0.4)
Alpha-2-Globulin: 0.7 g/dL (ref 0.4–1.0)
Beta Globulin: 0.8 g/dL (ref 0.7–1.3)
Gamma Globulin: 1.2 g/dL (ref 0.4–1.8)
Globulin, Total: 2.9 g/dL (ref 2.2–3.9)
M-Spike, %: 0.5 g/dL — ABNORMAL HIGH
Total Protein ELP: 6.2 g/dL (ref 6.0–8.5)

## 2021-11-28 ENCOUNTER — Encounter: Payer: Self-pay | Admitting: Internal Medicine

## 2021-11-28 ENCOUNTER — Non-Acute Institutional Stay (SKILLED_NURSING_FACILITY): Payer: Medicare PPO | Admitting: Internal Medicine

## 2021-11-28 DIAGNOSIS — I129 Hypertensive chronic kidney disease with stage 1 through stage 4 chronic kidney disease, or unspecified chronic kidney disease: Secondary | ICD-10-CM | POA: Diagnosis not present

## 2021-11-28 DIAGNOSIS — N183 Chronic kidney disease, stage 3 unspecified: Secondary | ICD-10-CM

## 2021-11-28 DIAGNOSIS — E1122 Type 2 diabetes mellitus with diabetic chronic kidney disease: Secondary | ICD-10-CM | POA: Diagnosis not present

## 2021-11-28 DIAGNOSIS — D539 Nutritional anemia, unspecified: Secondary | ICD-10-CM

## 2021-11-28 DIAGNOSIS — E039 Hypothyroidism, unspecified: Secondary | ICD-10-CM | POA: Diagnosis not present

## 2021-11-28 DIAGNOSIS — F01518 Vascular dementia, unspecified severity, with other behavioral disturbance: Secondary | ICD-10-CM

## 2021-11-28 NOTE — Progress Notes (Signed)
NURSING HOME LOCATION:  [facility] ROOM NUMBER:  [address]  CODE STATUS:  DNR  PCP:  Ok Edwards NP  This is a nursing facility follow up visit of chronic medical diagnoses & to document compliance with Regulation 483.30 (c) in The [place] Manual Phase 2 which mandates caregiver visit ( visits can alternate among physician, PA or NP as per statutes) within 10 days of 30 days / 60 days/ 90 days post admission to SNF date    Interim medical record and care since last SNF visit was updated with review of diagnostic studies and change in clinical status since last visit were documented.  HPI: She is a permanent resident of this facility history of central nervous system lymphoma, hypothyroidism, multiple myeloma, dementia, and diabetes with CKD. Current labs were reviewed.  Glucoses range from 88 up to 131; A1c was 5.9% indicating prediabetes.  CKD stage III A/B is present with most recent creatinine 1.35 and GFR 40.  GFR has ranged from 40 up to 51.  LDL is 81; ideal would be less than 70.  Her anemia has been relatively stable with H/H range of 10.9-11.9/34.5-35.8.  Platelet count has varied from 103,000 up to 160,000.  Review of systems: Dementia invalidated responses.  She could not tell me when she had seen her Oncologist.  She thought it was today.  She tended to confabulate.  She describes variation in stool consistency from loose to diarrhea.  She states that this is caused by ingesting bananas.  She describes occasional burning pain in the mid abdomen.  She went on to discuss "2 kinds of diarrhea, 1 formed with blood".  During the interview she asked my name on several occasions despite my having  introduced myself when I entered the room.  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  Constitutional: No fever, significant weight change, fatigue  Eyes: No redness, discharge, pain, vision change ENT/mouth: No nasal congestion,  purulent discharge,  earache, change in hearing, sore throat  Cardiovascular: No chest pain, palpitations, paroxysmal nocturnal dyspnea, claudication, edema  Respiratory: No cough, sputum production, hemoptysis, DOE, significant snoring, apnea   Gastrointestinal: No dysphagia,nausea /vomiting, melena Genitourinary: No dysuria, hematuria, pyuria, incontinence, nocturia Musculoskeletal: No joint stiffness, joint swelling, weakness, pain Dermatologic: No rash, pruritus, change in appearance of skin Neurologic: No dizziness, headache, syncope, seizures, numbness, tingling Psychiatric: No significant anxiety, depression, insomnia, anorexia Endocrine: No change in hair/skin/nails, excessive thirst, excessive hunger, excessive urination  Hematologic/lymphatic: No significant bruising, lymphadenopathy, abnormal bleeding Allergy/immunology: No itchy/watery eyes, significant sneezing, urticaria, angioedema  Physical exam:  Pertinent or positive findings: She is thin and appears suboptimally nourished.  Facies tend to be blank.  She is wearing her upper plate.  She is missing multiple mandibular teeth.  First and second heart sounds are accentuated.  Rhythm is irregular.  Pedal pulses are palpable.  Limbs are thin and interosseous wasting is present.  Despite this strength testing was good to opposition.  General appearance: no acute distress, increased work of breathing is present.   Lymphatic: No lymphadenopathy about the head, neck, axilla. Eyes: No conjunctival inflammation or lid edema is present. There is no scleral icterus. Ears:  External ear exam shows no significant lesions or deformities.   Nose:  External nasal examination shows no deformity or inflammation. Nasal mucosa are pink and moist without lesions, exudates Oral exam:  Lips and gums are healthy appearing. There is no oropharyngeal erythema  or exudate. Neck:  No thyromegaly, masses, tenderness noted.    Heart:  Normal rate and regular rhythm. S1 and S2  normal without gallop, murmur, click, rub .  Lungs: Chest clear to auscultation without wheezes, rhonchi, rales, rubs. Abdomen: Bowel sounds are normal. Abdomen is soft and nontender with no organomegaly, hernias, masses. GU: Deferred  Extremities:  No cyanosis, clubbing, edema  Neurologic exam : Cn 2-7 intact Strength equal  in upper & lower extremities Balance, Rhomberg, finger to nose testing could not be completed due to clinical state Deep tendon reflexes are equal Skin: Warm & dry w/o tenting. No significant lesions or rash.  See summary under each active problem in the Problem List with associated updated therapeutic plan

## 2021-11-28 NOTE — Assessment & Plan Note (Addendum)
GFR has varied from 40 up to 51 indicating CKD stage III A/B.  Current creatinine is 1.35 with a GFR 40. If there is progressive CKD; losartan will be weaned or discontinued.

## 2021-11-28 NOTE — Assessment & Plan Note (Signed)
Glucose values range from 88 up to 131.  Current A1c is 5.9% indicating prediabetes.  No change indicated.

## 2021-11-28 NOTE — Assessment & Plan Note (Deleted)
GFR has varied from 40 up to 51 indicating CKD stage IIIa/B.  Current creatinine is 1.35 with a GFR 40. If there is progressive CKD; losartan will be weaned or discontinued.

## 2021-11-28 NOTE — Assessment & Plan Note (Signed)
Oncology is monitoring the CBC.  The anemia has been relatively stable with H/H ranging from 10.9-11.9/34.5-35.8.  No bleeding dyscrasias reported at the SNF.

## 2021-11-28 NOTE — Patient Instructions (Signed)
See assessment and plan under each diagnosis in the problem list and acutely for this visit 

## 2021-11-28 NOTE — Assessment & Plan Note (Signed)
TSH is therapeutic; no change in L-thyroxine dose indicated.

## 2021-11-29 DIAGNOSIS — R279 Unspecified lack of coordination: Secondary | ICD-10-CM | POA: Diagnosis not present

## 2021-11-29 DIAGNOSIS — F0390 Unspecified dementia without behavioral disturbance: Secondary | ICD-10-CM | POA: Diagnosis not present

## 2021-11-29 DIAGNOSIS — M810 Age-related osteoporosis without current pathological fracture: Secondary | ICD-10-CM | POA: Diagnosis not present

## 2021-11-29 DIAGNOSIS — Z9181 History of falling: Secondary | ICD-10-CM | POA: Diagnosis not present

## 2021-11-29 DIAGNOSIS — R262 Difficulty in walking, not elsewhere classified: Secondary | ICD-10-CM | POA: Diagnosis not present

## 2021-11-29 NOTE — Assessment & Plan Note (Signed)
She confabulates , but I could appreciate no significant behavioral issues today.

## 2021-12-01 DIAGNOSIS — M810 Age-related osteoporosis without current pathological fracture: Secondary | ICD-10-CM | POA: Diagnosis not present

## 2021-12-01 DIAGNOSIS — R262 Difficulty in walking, not elsewhere classified: Secondary | ICD-10-CM | POA: Diagnosis not present

## 2021-12-01 DIAGNOSIS — R279 Unspecified lack of coordination: Secondary | ICD-10-CM | POA: Diagnosis not present

## 2021-12-01 DIAGNOSIS — F0390 Unspecified dementia without behavioral disturbance: Secondary | ICD-10-CM | POA: Diagnosis not present

## 2021-12-01 DIAGNOSIS — Z9181 History of falling: Secondary | ICD-10-CM | POA: Diagnosis not present

## 2021-12-02 DIAGNOSIS — M810 Age-related osteoporosis without current pathological fracture: Secondary | ICD-10-CM | POA: Diagnosis not present

## 2021-12-02 DIAGNOSIS — R279 Unspecified lack of coordination: Secondary | ICD-10-CM | POA: Diagnosis not present

## 2021-12-02 DIAGNOSIS — F0390 Unspecified dementia without behavioral disturbance: Secondary | ICD-10-CM | POA: Diagnosis not present

## 2021-12-02 DIAGNOSIS — Z9181 History of falling: Secondary | ICD-10-CM | POA: Diagnosis not present

## 2021-12-02 DIAGNOSIS — R262 Difficulty in walking, not elsewhere classified: Secondary | ICD-10-CM | POA: Diagnosis not present

## 2021-12-03 DIAGNOSIS — R279 Unspecified lack of coordination: Secondary | ICD-10-CM | POA: Diagnosis not present

## 2021-12-03 DIAGNOSIS — M810 Age-related osteoporosis without current pathological fracture: Secondary | ICD-10-CM | POA: Diagnosis not present

## 2021-12-03 DIAGNOSIS — F0390 Unspecified dementia without behavioral disturbance: Secondary | ICD-10-CM | POA: Diagnosis not present

## 2021-12-03 DIAGNOSIS — R262 Difficulty in walking, not elsewhere classified: Secondary | ICD-10-CM | POA: Diagnosis not present

## 2021-12-03 DIAGNOSIS — Z9181 History of falling: Secondary | ICD-10-CM | POA: Diagnosis not present

## 2021-12-04 DIAGNOSIS — R262 Difficulty in walking, not elsewhere classified: Secondary | ICD-10-CM | POA: Diagnosis not present

## 2021-12-04 DIAGNOSIS — Z9181 History of falling: Secondary | ICD-10-CM | POA: Diagnosis not present

## 2021-12-04 DIAGNOSIS — M810 Age-related osteoporosis without current pathological fracture: Secondary | ICD-10-CM | POA: Diagnosis not present

## 2021-12-04 DIAGNOSIS — R279 Unspecified lack of coordination: Secondary | ICD-10-CM | POA: Diagnosis not present

## 2021-12-04 DIAGNOSIS — F0390 Unspecified dementia without behavioral disturbance: Secondary | ICD-10-CM | POA: Diagnosis not present

## 2021-12-05 DIAGNOSIS — M810 Age-related osteoporosis without current pathological fracture: Secondary | ICD-10-CM | POA: Diagnosis not present

## 2021-12-05 DIAGNOSIS — Z9181 History of falling: Secondary | ICD-10-CM | POA: Diagnosis not present

## 2021-12-05 DIAGNOSIS — F0390 Unspecified dementia without behavioral disturbance: Secondary | ICD-10-CM | POA: Diagnosis not present

## 2021-12-05 DIAGNOSIS — R279 Unspecified lack of coordination: Secondary | ICD-10-CM | POA: Diagnosis not present

## 2021-12-05 DIAGNOSIS — R262 Difficulty in walking, not elsewhere classified: Secondary | ICD-10-CM | POA: Diagnosis not present

## 2021-12-09 DIAGNOSIS — R262 Difficulty in walking, not elsewhere classified: Secondary | ICD-10-CM | POA: Diagnosis not present

## 2021-12-09 DIAGNOSIS — R279 Unspecified lack of coordination: Secondary | ICD-10-CM | POA: Diagnosis not present

## 2021-12-09 DIAGNOSIS — F0390 Unspecified dementia without behavioral disturbance: Secondary | ICD-10-CM | POA: Diagnosis not present

## 2021-12-09 DIAGNOSIS — Z9181 History of falling: Secondary | ICD-10-CM | POA: Diagnosis not present

## 2021-12-09 DIAGNOSIS — M810 Age-related osteoporosis without current pathological fracture: Secondary | ICD-10-CM | POA: Diagnosis not present

## 2021-12-10 ENCOUNTER — Inpatient Hospital Stay (HOSPITAL_COMMUNITY): Payer: Medicare PPO

## 2021-12-10 VITALS — BP 155/98 | HR 76 | Temp 97.2°F | Resp 18 | Wt 120.8 lb

## 2021-12-10 DIAGNOSIS — I10 Essential (primary) hypertension: Secondary | ICD-10-CM | POA: Diagnosis not present

## 2021-12-10 DIAGNOSIS — Z8572 Personal history of non-Hodgkin lymphomas: Secondary | ICD-10-CM | POA: Diagnosis not present

## 2021-12-10 DIAGNOSIS — C9000 Multiple myeloma not having achieved remission: Secondary | ICD-10-CM

## 2021-12-10 DIAGNOSIS — R279 Unspecified lack of coordination: Secondary | ICD-10-CM | POA: Diagnosis not present

## 2021-12-10 DIAGNOSIS — Z5111 Encounter for antineoplastic chemotherapy: Secondary | ICD-10-CM | POA: Diagnosis not present

## 2021-12-10 DIAGNOSIS — E039 Hypothyroidism, unspecified: Secondary | ICD-10-CM | POA: Diagnosis not present

## 2021-12-10 DIAGNOSIS — F0390 Unspecified dementia without behavioral disturbance: Secondary | ICD-10-CM | POA: Diagnosis not present

## 2021-12-10 DIAGNOSIS — M810 Age-related osteoporosis without current pathological fracture: Secondary | ICD-10-CM | POA: Diagnosis not present

## 2021-12-10 DIAGNOSIS — R262 Difficulty in walking, not elsewhere classified: Secondary | ICD-10-CM | POA: Diagnosis not present

## 2021-12-10 DIAGNOSIS — Z923 Personal history of irradiation: Secondary | ICD-10-CM | POA: Diagnosis not present

## 2021-12-10 DIAGNOSIS — E119 Type 2 diabetes mellitus without complications: Secondary | ICD-10-CM | POA: Diagnosis not present

## 2021-12-10 DIAGNOSIS — Z9181 History of falling: Secondary | ICD-10-CM | POA: Diagnosis not present

## 2021-12-10 LAB — CBC WITH DIFFERENTIAL/PLATELET
Abs Immature Granulocytes: 0 10*3/uL (ref 0.00–0.07)
Basophils Absolute: 0 10*3/uL (ref 0.0–0.1)
Basophils Relative: 1 %
Eosinophils Absolute: 0.1 10*3/uL (ref 0.0–0.5)
Eosinophils Relative: 3 %
HCT: 35.3 % — ABNORMAL LOW (ref 36.0–46.0)
Hemoglobin: 11.3 g/dL — ABNORMAL LOW (ref 12.0–15.0)
Immature Granulocytes: 0 %
Lymphocytes Relative: 15 %
Lymphs Abs: 0.6 10*3/uL — ABNORMAL LOW (ref 0.7–4.0)
MCH: 31.8 pg (ref 26.0–34.0)
MCHC: 32 g/dL (ref 30.0–36.0)
MCV: 99.4 fL (ref 80.0–100.0)
Monocytes Absolute: 0.5 10*3/uL (ref 0.1–1.0)
Monocytes Relative: 12 %
Neutro Abs: 2.9 10*3/uL (ref 1.7–7.7)
Neutrophils Relative %: 69 %
Platelets: 148 10*3/uL — ABNORMAL LOW (ref 150–400)
RBC: 3.55 MIL/uL — ABNORMAL LOW (ref 3.87–5.11)
RDW: 15.6 % — ABNORMAL HIGH (ref 11.5–15.5)
WBC: 4.1 10*3/uL (ref 4.0–10.5)
nRBC: 0 % (ref 0.0–0.2)

## 2021-12-10 LAB — COMPREHENSIVE METABOLIC PANEL
ALT: 21 U/L (ref 0–44)
AST: 31 U/L (ref 15–41)
Albumin: 3.5 g/dL (ref 3.5–5.0)
Alkaline Phosphatase: 69 U/L (ref 38–126)
Anion gap: 5 (ref 5–15)
BUN: 20 mg/dL (ref 8–23)
CO2: 25 mmol/L (ref 22–32)
Calcium: 8.9 mg/dL (ref 8.9–10.3)
Chloride: 111 mmol/L (ref 98–111)
Creatinine, Ser: 1.33 mg/dL — ABNORMAL HIGH (ref 0.44–1.00)
GFR, Estimated: 41 mL/min — ABNORMAL LOW (ref >60)
Glucose, Bld: 151 mg/dL — ABNORMAL HIGH (ref 70–99)
Potassium: 3.7 mmol/L (ref 3.5–5.1)
Sodium: 141 mmol/L (ref 135–145)
Total Bilirubin: 0.5 mg/dL (ref 0.3–1.2)
Total Protein: 6.9 g/dL (ref 6.5–8.1)

## 2021-12-10 LAB — MAGNESIUM: Magnesium: 2.2 mg/dL (ref 1.7–2.4)

## 2021-12-10 LAB — LACTATE DEHYDROGENASE: LDH: 207 U/L — ABNORMAL HIGH (ref 98–192)

## 2021-12-10 MED ORDER — BORTEZOMIB CHEMO SQ INJECTION 3.5 MG (2.5MG/ML)
1.3000 mg/m2 | Freq: Once | INTRAMUSCULAR | Status: AC
  Administered 2021-12-10: 15:00:00 2 mg via SUBCUTANEOUS
  Filled 2021-12-10: qty 0.8

## 2021-12-10 MED ORDER — DEXAMETHASONE 4 MG PO TABS
10.0000 mg | ORAL_TABLET | Freq: Once | ORAL | Status: AC
  Administered 2021-12-10: 14:00:00 10 mg via ORAL
  Filled 2021-12-10: qty 3

## 2021-12-10 NOTE — Progress Notes (Signed)
Labs reviewed , will proceed as planned.   Treatment given per orders. Patient tolerated it well without problems. Vitals stable and discharged home from clinic via wheelchair. Follow up as scheduled.

## 2021-12-11 ENCOUNTER — Encounter (HOSPITAL_COMMUNITY): Payer: Self-pay

## 2021-12-11 ENCOUNTER — Encounter (HOSPITAL_COMMUNITY): Payer: Self-pay | Admitting: Hematology

## 2021-12-11 DIAGNOSIS — F0390 Unspecified dementia without behavioral disturbance: Secondary | ICD-10-CM | POA: Diagnosis not present

## 2021-12-11 DIAGNOSIS — R262 Difficulty in walking, not elsewhere classified: Secondary | ICD-10-CM | POA: Diagnosis not present

## 2021-12-11 DIAGNOSIS — Z9181 History of falling: Secondary | ICD-10-CM | POA: Diagnosis not present

## 2021-12-11 DIAGNOSIS — R279 Unspecified lack of coordination: Secondary | ICD-10-CM | POA: Diagnosis not present

## 2021-12-11 DIAGNOSIS — M810 Age-related osteoporosis without current pathological fracture: Secondary | ICD-10-CM | POA: Diagnosis not present

## 2021-12-11 LAB — KAPPA/LAMBDA LIGHT CHAINS
Kappa free light chain: 21.9 mg/L — ABNORMAL HIGH (ref 3.3–19.4)
Kappa, lambda light chain ratio: 1.02 (ref 0.26–1.65)
Lambda free light chains: 21.4 mg/L (ref 5.7–26.3)

## 2021-12-11 NOTE — Patient Instructions (Signed)
[facility]  Discharge Instructions: Thank you for choosing [facility] to provide your oncology and hematology care.  If you have a lab appointment with the Cancer Center, please come in thru the Main Entrance and check in at the main information desk.  Wear comfortable clothing and clothing appropriate for easy access to any Portacath or PICC line.   We strive to give you quality time with your provider. You may need to reschedule your appointment if you arrive late (15 or more minutes).  Arriving late affects you and other patients whose appointments are after yours.  Also, if you miss three or more appointments without notifying the office, you may be dismissed from the clinic at the provider's discretion.      For prescription refill requests, have your pharmacy contact our office and allow 72 hours for refills to be completed.    Today you received the following chemotherapy and/or immunotherapy agents    To help prevent nausea and vomiting after your treatment, we encourage you to take your nausea medication as directed.  BELOW ARE SYMPTOMS THAT SHOULD BE REPORTED IMMEDIATELY: . *FEVER GREATER THAN 100.4 F (38 C) OR HIGHER . *CHILLS OR SWEATING . *NAUSEA AND VOMITING THAT IS NOT CONTROLLED WITH YOUR NAUSEA MEDICATION . *UNUSUAL SHORTNESS OF BREATH . *UNUSUAL BRUISING OR BLEEDING . *URINARY PROBLEMS (pain or burning when urinating, or frequent urination) . *BOWEL PROBLEMS (unusual diarrhea, constipation, pain near the anus) . TENDERNESS IN MOUTH AND THROAT WITH OR WITHOUT PRESENCE OF ULCERS (sore throat, sores in mouth, or a toothache) . UNUSUAL RASH, SWELLING OR PAIN  . UNUSUAL VAGINAL DISCHARGE OR ITCHING   Items with * indicate a potential emergency and should be followed up as soon as possible or go to the Emergency Department if any problems should occur.  Please show the CHEMOTHERAPY ALERT CARD or IMMUNOTHERAPY ALERT CARD at check-in to the  Emergency Department and triage nurse.  Should you have questions after your visit or need to cancel or reschedule your appointment, please contact [facility] [phone]  and follow the prompts.  Office hours are 8:00 a.m. to 4:30 p.m. Monday - Friday. Please note that voicemails left after 4:00 p.m. may not be returned until the following business day.  We are closed weekends and major holidays. You have access to a nurse at all times for urgent questions. Please call the main number to the clinic [phone] and follow the prompts.  For any non-urgent questions, you may also contact your provider using MyChart. We now offer e-Visits for anyone 18 and older to request care online for non-urgent symptoms. For details visit [email or web address].   Also download the MyChart app! Go to the app store, search "MyChart", open the app, select [facility], and log in with your MyChart username and password.  Due to Covid, a mask is required upon entering the hospital/clinic. If you do not have a mask, one will be given to you upon arrival. For doctor visits, patients may have 1 support person aged 18 or older with them. For treatment visits, patients cannot have anyone with them due to current Covid guidelines and our immunocompromised population.  

## 2021-12-12 DIAGNOSIS — R279 Unspecified lack of coordination: Secondary | ICD-10-CM | POA: Diagnosis not present

## 2021-12-12 DIAGNOSIS — M810 Age-related osteoporosis without current pathological fracture: Secondary | ICD-10-CM | POA: Diagnosis not present

## 2021-12-12 DIAGNOSIS — F0390 Unspecified dementia without behavioral disturbance: Secondary | ICD-10-CM | POA: Diagnosis not present

## 2021-12-12 DIAGNOSIS — R262 Difficulty in walking, not elsewhere classified: Secondary | ICD-10-CM | POA: Diagnosis not present

## 2021-12-12 DIAGNOSIS — Z9181 History of falling: Secondary | ICD-10-CM | POA: Diagnosis not present

## 2021-12-12 LAB — PROTEIN ELECTROPHORESIS, SERUM
A/G Ratio: 1.1 (ref 0.7–1.7)
Albumin ELP: 3.3 g/dL (ref 2.9–4.4)
Alpha-1-Globulin: 0.3 g/dL (ref 0.0–0.4)
Alpha-2-Globulin: 0.8 g/dL (ref 0.4–1.0)
Beta Globulin: 0.9 g/dL (ref 0.7–1.3)
Gamma Globulin: 1.1 g/dL (ref 0.4–1.8)
Globulin, Total: 3.1 g/dL (ref 2.2–3.9)
M-Spike, %: 0.5 g/dL — ABNORMAL HIGH
Total Protein ELP: 6.4 g/dL (ref 6.0–8.5)

## 2021-12-13 DIAGNOSIS — F0390 Unspecified dementia without behavioral disturbance: Secondary | ICD-10-CM | POA: Diagnosis not present

## 2021-12-13 DIAGNOSIS — R279 Unspecified lack of coordination: Secondary | ICD-10-CM | POA: Diagnosis not present

## 2021-12-13 DIAGNOSIS — Z9181 History of falling: Secondary | ICD-10-CM | POA: Diagnosis not present

## 2021-12-13 DIAGNOSIS — M810 Age-related osteoporosis without current pathological fracture: Secondary | ICD-10-CM | POA: Diagnosis not present

## 2021-12-13 DIAGNOSIS — R262 Difficulty in walking, not elsewhere classified: Secondary | ICD-10-CM | POA: Diagnosis not present

## 2021-12-16 DIAGNOSIS — Z9181 History of falling: Secondary | ICD-10-CM | POA: Diagnosis not present

## 2021-12-16 DIAGNOSIS — R262 Difficulty in walking, not elsewhere classified: Secondary | ICD-10-CM | POA: Diagnosis not present

## 2021-12-16 DIAGNOSIS — R279 Unspecified lack of coordination: Secondary | ICD-10-CM | POA: Diagnosis not present

## 2021-12-16 DIAGNOSIS — F0390 Unspecified dementia without behavioral disturbance: Secondary | ICD-10-CM | POA: Diagnosis not present

## 2021-12-16 DIAGNOSIS — M810 Age-related osteoporosis without current pathological fracture: Secondary | ICD-10-CM | POA: Diagnosis not present

## 2021-12-17 ENCOUNTER — Inpatient Hospital Stay (HOSPITAL_COMMUNITY): Payer: Medicare PPO

## 2021-12-17 ENCOUNTER — Inpatient Hospital Stay (HOSPITAL_COMMUNITY): Payer: Medicare PPO | Attending: Hematology

## 2021-12-17 DIAGNOSIS — E1122 Type 2 diabetes mellitus with diabetic chronic kidney disease: Secondary | ICD-10-CM | POA: Insufficient documentation

## 2021-12-17 DIAGNOSIS — Z7961 Long term (current) use of immunomodulator: Secondary | ICD-10-CM | POA: Insufficient documentation

## 2021-12-17 DIAGNOSIS — R279 Unspecified lack of coordination: Secondary | ICD-10-CM | POA: Diagnosis not present

## 2021-12-17 DIAGNOSIS — Z7952 Long term (current) use of systemic steroids: Secondary | ICD-10-CM | POA: Insufficient documentation

## 2021-12-17 DIAGNOSIS — R197 Diarrhea, unspecified: Secondary | ICD-10-CM | POA: Insufficient documentation

## 2021-12-17 DIAGNOSIS — F0390 Unspecified dementia without behavioral disturbance: Secondary | ICD-10-CM | POA: Insufficient documentation

## 2021-12-17 DIAGNOSIS — R112 Nausea with vomiting, unspecified: Secondary | ICD-10-CM | POA: Insufficient documentation

## 2021-12-17 DIAGNOSIS — Z8572 Personal history of non-Hodgkin lymphomas: Secondary | ICD-10-CM | POA: Insufficient documentation

## 2021-12-17 DIAGNOSIS — Z5111 Encounter for antineoplastic chemotherapy: Secondary | ICD-10-CM | POA: Insufficient documentation

## 2021-12-17 DIAGNOSIS — N189 Chronic kidney disease, unspecified: Secondary | ICD-10-CM | POA: Insufficient documentation

## 2021-12-17 DIAGNOSIS — E039 Hypothyroidism, unspecified: Secondary | ICD-10-CM | POA: Insufficient documentation

## 2021-12-17 DIAGNOSIS — M810 Age-related osteoporosis without current pathological fracture: Secondary | ICD-10-CM | POA: Diagnosis not present

## 2021-12-17 DIAGNOSIS — I129 Hypertensive chronic kidney disease with stage 1 through stage 4 chronic kidney disease, or unspecified chronic kidney disease: Secondary | ICD-10-CM | POA: Insufficient documentation

## 2021-12-17 DIAGNOSIS — Z9181 History of falling: Secondary | ICD-10-CM | POA: Diagnosis not present

## 2021-12-17 DIAGNOSIS — R262 Difficulty in walking, not elsewhere classified: Secondary | ICD-10-CM | POA: Diagnosis not present

## 2021-12-17 DIAGNOSIS — E1136 Type 2 diabetes mellitus with diabetic cataract: Secondary | ICD-10-CM | POA: Insufficient documentation

## 2021-12-17 DIAGNOSIS — C9000 Multiple myeloma not having achieved remission: Secondary | ICD-10-CM | POA: Insufficient documentation

## 2021-12-18 DIAGNOSIS — R262 Difficulty in walking, not elsewhere classified: Secondary | ICD-10-CM | POA: Diagnosis not present

## 2021-12-18 DIAGNOSIS — R279 Unspecified lack of coordination: Secondary | ICD-10-CM | POA: Diagnosis not present

## 2021-12-18 DIAGNOSIS — F0390 Unspecified dementia without behavioral disturbance: Secondary | ICD-10-CM | POA: Diagnosis not present

## 2021-12-18 DIAGNOSIS — M810 Age-related osteoporosis without current pathological fracture: Secondary | ICD-10-CM | POA: Diagnosis not present

## 2021-12-18 DIAGNOSIS — Z9181 History of falling: Secondary | ICD-10-CM | POA: Diagnosis not present

## 2021-12-19 ENCOUNTER — Inpatient Hospital Stay (HOSPITAL_COMMUNITY): Payer: Medicare PPO

## 2021-12-19 VITALS — BP 153/79 | HR 87 | Temp 97.8°F | Resp 18 | Ht 69.0 in | Wt 119.0 lb

## 2021-12-19 DIAGNOSIS — Z5111 Encounter for antineoplastic chemotherapy: Secondary | ICD-10-CM | POA: Diagnosis not present

## 2021-12-19 DIAGNOSIS — N189 Chronic kidney disease, unspecified: Secondary | ICD-10-CM | POA: Diagnosis not present

## 2021-12-19 DIAGNOSIS — C9000 Multiple myeloma not having achieved remission: Secondary | ICD-10-CM

## 2021-12-19 DIAGNOSIS — C8590 Non-Hodgkin lymphoma, unspecified, unspecified site: Secondary | ICD-10-CM | POA: Diagnosis not present

## 2021-12-19 DIAGNOSIS — R262 Difficulty in walking, not elsewhere classified: Secondary | ICD-10-CM | POA: Diagnosis not present

## 2021-12-19 DIAGNOSIS — E039 Hypothyroidism, unspecified: Secondary | ICD-10-CM | POA: Diagnosis not present

## 2021-12-19 DIAGNOSIS — Z8572 Personal history of non-Hodgkin lymphomas: Secondary | ICD-10-CM | POA: Diagnosis not present

## 2021-12-19 DIAGNOSIS — R279 Unspecified lack of coordination: Secondary | ICD-10-CM | POA: Diagnosis not present

## 2021-12-19 DIAGNOSIS — R197 Diarrhea, unspecified: Secondary | ICD-10-CM | POA: Diagnosis not present

## 2021-12-19 DIAGNOSIS — F0390 Unspecified dementia without behavioral disturbance: Secondary | ICD-10-CM | POA: Diagnosis not present

## 2021-12-19 DIAGNOSIS — E1136 Type 2 diabetes mellitus with diabetic cataract: Secondary | ICD-10-CM | POA: Diagnosis not present

## 2021-12-19 DIAGNOSIS — E1122 Type 2 diabetes mellitus with diabetic chronic kidney disease: Secondary | ICD-10-CM | POA: Diagnosis not present

## 2021-12-19 DIAGNOSIS — I129 Hypertensive chronic kidney disease with stage 1 through stage 4 chronic kidney disease, or unspecified chronic kidney disease: Secondary | ICD-10-CM | POA: Diagnosis not present

## 2021-12-19 DIAGNOSIS — M810 Age-related osteoporosis without current pathological fracture: Secondary | ICD-10-CM | POA: Diagnosis not present

## 2021-12-19 DIAGNOSIS — Z7961 Long term (current) use of immunomodulator: Secondary | ICD-10-CM | POA: Diagnosis not present

## 2021-12-19 DIAGNOSIS — Z1159 Encounter for screening for other viral diseases: Secondary | ICD-10-CM | POA: Diagnosis not present

## 2021-12-19 DIAGNOSIS — Z7952 Long term (current) use of systemic steroids: Secondary | ICD-10-CM | POA: Diagnosis not present

## 2021-12-19 DIAGNOSIS — R112 Nausea with vomiting, unspecified: Secondary | ICD-10-CM | POA: Diagnosis not present

## 2021-12-19 DIAGNOSIS — Z9181 History of falling: Secondary | ICD-10-CM | POA: Diagnosis not present

## 2021-12-19 LAB — CBC WITH DIFFERENTIAL/PLATELET
Abs Immature Granulocytes: 0.01 10*3/uL (ref 0.00–0.07)
Basophils Absolute: 0 10*3/uL (ref 0.0–0.1)
Basophils Relative: 1 %
Eosinophils Absolute: 0.2 10*3/uL (ref 0.0–0.5)
Eosinophils Relative: 3 %
HCT: 35.7 % — ABNORMAL LOW (ref 36.0–46.0)
Hemoglobin: 11.5 g/dL — ABNORMAL LOW (ref 12.0–15.0)
Immature Granulocytes: 0 %
Lymphocytes Relative: 11 %
Lymphs Abs: 0.6 10*3/uL — ABNORMAL LOW (ref 0.7–4.0)
MCH: 31.4 pg (ref 26.0–34.0)
MCHC: 32.2 g/dL (ref 30.0–36.0)
MCV: 97.5 fL (ref 80.0–100.0)
Monocytes Absolute: 0.3 10*3/uL (ref 0.1–1.0)
Monocytes Relative: 6 %
Neutro Abs: 4.2 10*3/uL (ref 1.7–7.7)
Neutrophils Relative %: 79 %
Platelets: 127 10*3/uL — ABNORMAL LOW (ref 150–400)
RBC: 3.66 MIL/uL — ABNORMAL LOW (ref 3.87–5.11)
RDW: 15.8 % — ABNORMAL HIGH (ref 11.5–15.5)
WBC: 5.3 10*3/uL (ref 4.0–10.5)
nRBC: 0 % (ref 0.0–0.2)

## 2021-12-19 LAB — COMPREHENSIVE METABOLIC PANEL
ALT: 20 U/L (ref 0–44)
AST: 29 U/L (ref 15–41)
Albumin: 3.6 g/dL (ref 3.5–5.0)
Alkaline Phosphatase: 65 U/L (ref 38–126)
Anion gap: 8 (ref 5–15)
BUN: 27 mg/dL — ABNORMAL HIGH (ref 8–23)
CO2: 27 mmol/L (ref 22–32)
Calcium: 9 mg/dL (ref 8.9–10.3)
Chloride: 107 mmol/L (ref 98–111)
Creatinine, Ser: 1.31 mg/dL — ABNORMAL HIGH (ref 0.44–1.00)
GFR, Estimated: 42 mL/min — ABNORMAL LOW (ref >60)
Glucose, Bld: 124 mg/dL — ABNORMAL HIGH (ref 70–99)
Potassium: 3.4 mmol/L — ABNORMAL LOW (ref 3.5–5.1)
Sodium: 142 mmol/L (ref 135–145)
Total Bilirubin: 0.4 mg/dL (ref 0.3–1.2)
Total Protein: 6.7 g/dL (ref 6.5–8.1)

## 2021-12-19 MED ORDER — BORTEZOMIB CHEMO SQ INJECTION 3.5 MG (2.5MG/ML)
1.3000 mg/m2 | Freq: Once | INTRAMUSCULAR | Status: AC
  Administered 2021-12-19: 2 mg via SUBCUTANEOUS
  Filled 2021-12-19: qty 0.8

## 2021-12-19 MED ORDER — DEXAMETHASONE 4 MG PO TABS
10.0000 mg | ORAL_TABLET | Freq: Once | ORAL | Status: AC
  Administered 2021-12-19: 10 mg via ORAL
  Filled 2021-12-19: qty 3

## 2021-12-19 NOTE — Progress Notes (Signed)
Patient presents today for Velcade injection per providers order.  Vital signs and labs within parameters for treatment.  Patient has no new complaints at this time.  Stable during administration without incident; injection site WNL; see MAR for injection details.  Patient tolerated procedure well and without incident.  No questions or complaints noted at this time.  

## 2021-12-20 DIAGNOSIS — Z9181 History of falling: Secondary | ICD-10-CM | POA: Diagnosis not present

## 2021-12-20 DIAGNOSIS — R262 Difficulty in walking, not elsewhere classified: Secondary | ICD-10-CM | POA: Diagnosis not present

## 2021-12-20 DIAGNOSIS — R279 Unspecified lack of coordination: Secondary | ICD-10-CM | POA: Diagnosis not present

## 2021-12-20 DIAGNOSIS — M810 Age-related osteoporosis without current pathological fracture: Secondary | ICD-10-CM | POA: Diagnosis not present

## 2021-12-20 DIAGNOSIS — F0390 Unspecified dementia without behavioral disturbance: Secondary | ICD-10-CM | POA: Diagnosis not present

## 2021-12-23 ENCOUNTER — Inpatient Hospital Stay (HOSPITAL_COMMUNITY): Payer: Medicare PPO

## 2021-12-23 DIAGNOSIS — C9000 Multiple myeloma not having achieved remission: Secondary | ICD-10-CM

## 2021-12-23 DIAGNOSIS — E1122 Type 2 diabetes mellitus with diabetic chronic kidney disease: Secondary | ICD-10-CM | POA: Diagnosis not present

## 2021-12-23 DIAGNOSIS — R262 Difficulty in walking, not elsewhere classified: Secondary | ICD-10-CM | POA: Diagnosis not present

## 2021-12-23 DIAGNOSIS — F0390 Unspecified dementia without behavioral disturbance: Secondary | ICD-10-CM | POA: Diagnosis not present

## 2021-12-23 DIAGNOSIS — E039 Hypothyroidism, unspecified: Secondary | ICD-10-CM | POA: Diagnosis not present

## 2021-12-23 DIAGNOSIS — Z9181 History of falling: Secondary | ICD-10-CM | POA: Diagnosis not present

## 2021-12-23 DIAGNOSIS — I129 Hypertensive chronic kidney disease with stage 1 through stage 4 chronic kidney disease, or unspecified chronic kidney disease: Secondary | ICD-10-CM | POA: Diagnosis not present

## 2021-12-23 DIAGNOSIS — E1136 Type 2 diabetes mellitus with diabetic cataract: Secondary | ICD-10-CM | POA: Diagnosis not present

## 2021-12-23 DIAGNOSIS — M810 Age-related osteoporosis without current pathological fracture: Secondary | ICD-10-CM | POA: Diagnosis not present

## 2021-12-23 DIAGNOSIS — R279 Unspecified lack of coordination: Secondary | ICD-10-CM | POA: Diagnosis not present

## 2021-12-23 DIAGNOSIS — Z5111 Encounter for antineoplastic chemotherapy: Secondary | ICD-10-CM | POA: Diagnosis not present

## 2021-12-23 DIAGNOSIS — R112 Nausea with vomiting, unspecified: Secondary | ICD-10-CM | POA: Diagnosis not present

## 2021-12-23 DIAGNOSIS — R197 Diarrhea, unspecified: Secondary | ICD-10-CM | POA: Diagnosis not present

## 2021-12-23 LAB — COMPREHENSIVE METABOLIC PANEL
ALT: 19 U/L (ref 0–44)
AST: 30 U/L (ref 15–41)
Albumin: 3.5 g/dL (ref 3.5–5.0)
Alkaline Phosphatase: 57 U/L (ref 38–126)
Anion gap: 8 (ref 5–15)
BUN: 20 mg/dL (ref 8–23)
CO2: 25 mmol/L (ref 22–32)
Calcium: 8.9 mg/dL (ref 8.9–10.3)
Chloride: 109 mmol/L (ref 98–111)
Creatinine, Ser: 1.17 mg/dL — ABNORMAL HIGH (ref 0.44–1.00)
GFR, Estimated: 48 mL/min — ABNORMAL LOW (ref >60)
Glucose, Bld: 117 mg/dL — ABNORMAL HIGH (ref 70–99)
Potassium: 3.4 mmol/L — ABNORMAL LOW (ref 3.5–5.1)
Sodium: 142 mmol/L (ref 135–145)
Total Bilirubin: 0.5 mg/dL (ref 0.3–1.2)
Total Protein: 6.5 g/dL (ref 6.5–8.1)

## 2021-12-23 LAB — CBC WITH DIFFERENTIAL/PLATELET
Abs Immature Granulocytes: 0.01 10*3/uL (ref 0.00–0.07)
Basophils Absolute: 0 10*3/uL (ref 0.0–0.1)
Basophils Relative: 1 %
Eosinophils Absolute: 0.1 10*3/uL (ref 0.0–0.5)
Eosinophils Relative: 2 %
HCT: 36.8 % (ref 36.0–46.0)
Hemoglobin: 11.9 g/dL — ABNORMAL LOW (ref 12.0–15.0)
Immature Granulocytes: 0 %
Lymphocytes Relative: 17 %
Lymphs Abs: 0.7 10*3/uL (ref 0.7–4.0)
MCH: 31.7 pg (ref 26.0–34.0)
MCHC: 32.3 g/dL (ref 30.0–36.0)
MCV: 98.1 fL (ref 80.0–100.0)
Monocytes Absolute: 0.5 10*3/uL (ref 0.1–1.0)
Monocytes Relative: 12 %
Neutro Abs: 2.8 10*3/uL (ref 1.7–7.7)
Neutrophils Relative %: 68 %
Platelets: 106 10*3/uL — ABNORMAL LOW (ref 150–400)
RBC: 3.75 MIL/uL — ABNORMAL LOW (ref 3.87–5.11)
RDW: 15.6 % — ABNORMAL HIGH (ref 11.5–15.5)
WBC: 4.1 10*3/uL (ref 4.0–10.5)
nRBC: 0 % (ref 0.0–0.2)

## 2021-12-23 LAB — MAGNESIUM: Magnesium: 2.2 mg/dL (ref 1.7–2.4)

## 2021-12-24 ENCOUNTER — Other Ambulatory Visit (HOSPITAL_COMMUNITY): Payer: Medicare PPO

## 2021-12-24 ENCOUNTER — Inpatient Hospital Stay (HOSPITAL_COMMUNITY): Payer: Medicare PPO

## 2021-12-24 VITALS — BP 144/90 | HR 86 | Temp 96.8°F | Resp 17 | Ht 69.0 in | Wt 120.8 lb

## 2021-12-24 DIAGNOSIS — C9000 Multiple myeloma not having achieved remission: Secondary | ICD-10-CM | POA: Diagnosis not present

## 2021-12-24 DIAGNOSIS — E1136 Type 2 diabetes mellitus with diabetic cataract: Secondary | ICD-10-CM | POA: Diagnosis not present

## 2021-12-24 DIAGNOSIS — R279 Unspecified lack of coordination: Secondary | ICD-10-CM | POA: Diagnosis not present

## 2021-12-24 DIAGNOSIS — R112 Nausea with vomiting, unspecified: Secondary | ICD-10-CM | POA: Diagnosis not present

## 2021-12-24 DIAGNOSIS — Z5111 Encounter for antineoplastic chemotherapy: Secondary | ICD-10-CM | POA: Diagnosis not present

## 2021-12-24 DIAGNOSIS — R197 Diarrhea, unspecified: Secondary | ICD-10-CM | POA: Diagnosis not present

## 2021-12-24 DIAGNOSIS — I129 Hypertensive chronic kidney disease with stage 1 through stage 4 chronic kidney disease, or unspecified chronic kidney disease: Secondary | ICD-10-CM | POA: Diagnosis not present

## 2021-12-24 DIAGNOSIS — M810 Age-related osteoporosis without current pathological fracture: Secondary | ICD-10-CM | POA: Diagnosis not present

## 2021-12-24 DIAGNOSIS — Z9181 History of falling: Secondary | ICD-10-CM | POA: Diagnosis not present

## 2021-12-24 DIAGNOSIS — E1122 Type 2 diabetes mellitus with diabetic chronic kidney disease: Secondary | ICD-10-CM | POA: Diagnosis not present

## 2021-12-24 DIAGNOSIS — F0390 Unspecified dementia without behavioral disturbance: Secondary | ICD-10-CM | POA: Diagnosis not present

## 2021-12-24 DIAGNOSIS — E039 Hypothyroidism, unspecified: Secondary | ICD-10-CM | POA: Diagnosis not present

## 2021-12-24 DIAGNOSIS — R262 Difficulty in walking, not elsewhere classified: Secondary | ICD-10-CM | POA: Diagnosis not present

## 2021-12-24 MED ORDER — DEXAMETHASONE 4 MG PO TABS
10.0000 mg | ORAL_TABLET | Freq: Once | ORAL | Status: AC
  Administered 2021-12-24: 10 mg via ORAL
  Filled 2021-12-24: qty 3

## 2021-12-24 MED ORDER — BORTEZOMIB CHEMO SQ INJECTION 3.5 MG (2.5MG/ML)
1.3000 mg/m2 | Freq: Once | INTRAMUSCULAR | Status: AC
  Administered 2021-12-24: 2 mg via SUBCUTANEOUS
  Filled 2021-12-24: qty 0.8

## 2021-12-24 NOTE — Progress Notes (Signed)
Patient presents today for Velcade injection.  Patient is in satisfactory condition with no complaints voiced.  Vital signs are stable.  Labs reviewed and all are within treatment parameters.  We will proceed with treatment per MD orders.   Patient tolerated Velcade injection with no complaints voiced.  Site clean and dry with no bruising or swelling noted.  No complaints of pain.  Discharged with vital signs stable and no signs or symptoms of distress noted.

## 2021-12-24 NOTE — Patient Instructions (Signed)
[facility]  Discharge Instructions: Thank you for choosing [facility] to provide your oncology and hematology care.  If you have a lab appointment with the Cancer Center, please come in thru the Main Entrance and check in at the main information desk.  Wear comfortable clothing and clothing appropriate for easy access to any Portacath or PICC line.   We strive to give you quality time with your provider. You may need to reschedule your appointment if you arrive late (15 or more minutes).  Arriving late affects you and other patients whose appointments are after yours.  Also, if you miss three or more appointments without notifying the office, you may be dismissed from the clinic at the provider's discretion.      For prescription refill requests, have your pharmacy contact our office and allow 72 hours for refills to be completed.        To help prevent nausea and vomiting after your treatment, we encourage you to take your nausea medication as directed.  BELOW ARE SYMPTOMS THAT SHOULD BE REPORTED IMMEDIATELY: *FEVER GREATER THAN 100.4 F (38 C) OR HIGHER *CHILLS OR SWEATING *NAUSEA AND VOMITING THAT IS NOT CONTROLLED WITH YOUR NAUSEA MEDICATION *UNUSUAL SHORTNESS OF BREATH *UNUSUAL BRUISING OR BLEEDING *URINARY PROBLEMS (pain or burning when urinating, or frequent urination) *BOWEL PROBLEMS (unusual diarrhea, constipation, pain near the anus) TENDERNESS IN MOUTH AND THROAT WITH OR WITHOUT PRESENCE OF ULCERS (sore throat, sores in mouth, or a toothache) UNUSUAL RASH, SWELLING OR PAIN  UNUSUAL VAGINAL DISCHARGE OR ITCHING   Items with * indicate a potential emergency and should be followed up as soon as possible or go to the Emergency Department if any problems should occur.  Please show the CHEMOTHERAPY ALERT CARD or IMMUNOTHERAPY ALERT CARD at check-in to the Emergency Department and triage nurse.  Should you have questions after your visit or need to cancel  or reschedule your appointment, please contact [facility] [phone]  and follow the prompts.  Office hours are 8:00 a.m. to 4:30 p.m. Monday - Friday. Please note that voicemails left after 4:00 p.m. may not be returned until the following business day.  We are closed weekends and major holidays. You have access to a nurse at all times for urgent questions. Please call the main number to the clinic [phone] and follow the prompts.  For any non-urgent questions, you may also contact your provider using MyChart. We now offer e-Visits for anyone 18 and older to request care online for non-urgent symptoms. For details visit [email or web address].   Also download the MyChart app! Go to the app store, search "MyChart", open the app, select  [facility], and log in with your MyChart username and password.  Due to Covid, a mask is required upon entering the hospital/clinic. If you do not have a mask, one will be given to you upon arrival. For doctor visits, patients may have 1 support person aged 18 or older with them. For treatment visits, patients cannot have anyone with them due to current Covid guidelines and our immunocompromised population.  

## 2021-12-25 DIAGNOSIS — R262 Difficulty in walking, not elsewhere classified: Secondary | ICD-10-CM | POA: Diagnosis not present

## 2021-12-25 DIAGNOSIS — M810 Age-related osteoporosis without current pathological fracture: Secondary | ICD-10-CM | POA: Diagnosis not present

## 2021-12-25 DIAGNOSIS — R279 Unspecified lack of coordination: Secondary | ICD-10-CM | POA: Diagnosis not present

## 2021-12-25 DIAGNOSIS — Z9181 History of falling: Secondary | ICD-10-CM | POA: Diagnosis not present

## 2021-12-25 DIAGNOSIS — F0390 Unspecified dementia without behavioral disturbance: Secondary | ICD-10-CM | POA: Diagnosis not present

## 2021-12-26 DIAGNOSIS — C8590 Non-Hodgkin lymphoma, unspecified, unspecified site: Secondary | ICD-10-CM | POA: Diagnosis not present

## 2021-12-26 DIAGNOSIS — F0390 Unspecified dementia without behavioral disturbance: Secondary | ICD-10-CM | POA: Diagnosis not present

## 2021-12-26 DIAGNOSIS — M810 Age-related osteoporosis without current pathological fracture: Secondary | ICD-10-CM | POA: Diagnosis not present

## 2021-12-26 DIAGNOSIS — R279 Unspecified lack of coordination: Secondary | ICD-10-CM | POA: Diagnosis not present

## 2021-12-26 DIAGNOSIS — R262 Difficulty in walking, not elsewhere classified: Secondary | ICD-10-CM | POA: Diagnosis not present

## 2021-12-26 DIAGNOSIS — Z9181 History of falling: Secondary | ICD-10-CM | POA: Diagnosis not present

## 2021-12-26 DIAGNOSIS — Z1159 Encounter for screening for other viral diseases: Secondary | ICD-10-CM | POA: Diagnosis not present

## 2022-01-02 ENCOUNTER — Encounter: Payer: Self-pay | Admitting: Adult Health

## 2022-01-02 ENCOUNTER — Non-Acute Institutional Stay (SKILLED_NURSING_FACILITY): Payer: Medicare PPO | Admitting: Adult Health

## 2022-01-02 DIAGNOSIS — I440 Atrioventricular block, first degree: Secondary | ICD-10-CM

## 2022-01-02 DIAGNOSIS — D696 Thrombocytopenia, unspecified: Secondary | ICD-10-CM

## 2022-01-02 DIAGNOSIS — I700 Atherosclerosis of aorta: Secondary | ICD-10-CM

## 2022-01-02 DIAGNOSIS — C9000 Multiple myeloma not having achieved remission: Secondary | ICD-10-CM | POA: Diagnosis not present

## 2022-01-02 DIAGNOSIS — Z1159 Encounter for screening for other viral diseases: Secondary | ICD-10-CM | POA: Diagnosis not present

## 2022-01-02 DIAGNOSIS — C8590 Non-Hodgkin lymphoma, unspecified, unspecified site: Secondary | ICD-10-CM | POA: Diagnosis not present

## 2022-01-02 NOTE — Progress Notes (Signed)
Location:  [facility] Room Number: 148 Place of Service:  SNF (31)   CODE STATUS: dnr   Allergies  Allergen Reactions   Lotensin [Benazepril]     Chief Complaint  Patient presents with   Medical Management of Chronic Issues           Thrombocytopenia: Multiple myeloma without achieving remission: Aortic atherosclerosis: First degree av block/bradycardia     HPI:  She is a 76 year old long term resident of this facility being seen for the management of her chronic illnesses: Thrombocytopenia: Multiple myeloma without achieving remission: Aortic atherosclerosis: First degree av block/bradycardia. There are no reports of uncontrolled pain; no reports of anxiety or agitation. Continues to slowly gain weight.   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[place])    Dementia ([place])    Diabetes mellitus with CKD    H/O partial nephrectomy    Hypokalemia    Hypothyroidism    Impaired cognition    Multiple myeloma (HCC)    Dr Maylon Peppers, [facility]   Thyroid disease     Past Surgical History:  Procedure Laterality Date   ABDOMINAL HYSTERECTOMY     CRANIOTOMY     for lymphoma   YAG LASER APPLICATION Right 04/30/16   Procedure: YAG LASER APPLICATION;  Surgeon: [name], MD;  Location: [facility];  Service: Ophthalmology;  Laterality: Right;    Social History   Socioeconomic History   Marital status: Single    Spouse name: Not on file   Number of children: Not on file   Years of education: Not on file   Highest education level: Not on file  Occupational History   Occupation: retired   Tobacco Use   Smoking status: Never   Smokeless tobacco: Never  Vaping Use   Vaping Use: Never used  Substance and Sexual Activity   Alcohol use: No   Drug use: No   Sexual activity: Never  Other Topics Concern   Not on file  Social History Narrative   Long term resident of [facility]    Social  Determinants of Health   Financial Resource Strain: Not on file  Food Insecurity: Not on file  Transportation Needs: Not on file  Physical Activity: Not on file  Stress: Not on file  Social Connections: Not on file  Intimate Partner Violence: Not on file   Family History  Problem Relation Age of Onset   Depression Mother    Diabetes Mother    Heart disease Father    Cancer - Prostate Brother    Cancer Brother    Cancer Sister       VITAL SIGNS BP 130/74    Pulse 79    Temp 98.5 F (36.9 C)    Resp 20    Ht '5\' 9"'  (1.753 m)    Wt 120 lb 6.4 oz (54.6 kg)    BMI 17.78 kg/m   Facility-Administered Encounter Medications as of 01/02/2022  Medication   cyanocobalamin ((VITAMIN B-12)) 1000 MCG/ML injection   cyanocobalamin ((VITAMIN B-12)) 1000 MCG/ML injection   denosumab (XGEVA) 120 MG/1.7ML injection   dexamethasone (DECADRON) 4 MG tablet   dexamethasone (DECADRON) 4 MG tablet   diphenhydrAMINE (BENADRYL) 50 MG/ML injection   diphenhydrAMINE (BENADRYL) 50 MG/ML injection   diphenhydrAMINE (BENADRYL) 50 MG/ML injection   epoetin alfa-epbx (RETACRIT) 49449 UNIT/ML injection   epoetin alfa-epbx (RETACRIT) 67591 UNIT/ML injection  epoetin alfa-epbx (RETACRIT) 71062 UNIT/ML injection   fulvestrant (FASLODEX) 250 MG/5ML injection   magnesium sulfate 2 GM/50ML IVPB   palonosetron (ALOXI) 0.25 MG/5ML injection   palonosetron (ALOXI) 0.25 MG/5ML injection   palonosetron (ALOXI) 0.25 MG/5ML injection   palonosetron (ALOXI) 0.25 MG/5ML injection   potassium chloride 10 MEQ/100ML IVPB   potassium chloride SA (KLOR-CON M) 20 MEQ CR tablet   potassium chloride SA (KLOR-CON M) 20 MEQ CR tablet   prochlorperazine (COMPAZINE) 10 MG tablet   prochlorperazine (COMPAZINE) 10 MG tablet   prochlorperazine (COMPAZINE) tablet 10 mg   Outpatient Encounter Medications as of 01/02/2022  Medication Sig   acetaminophen (TYLENOL) 500 MG tablet Take 1,000 mg by mouth every 6 (six) hours as needed  for mild pain.   acyclovir (ZOVIRAX) 400 MG tablet TAKE 1 TABLET BY MOUTH TWICE DAILY   aspirin EC 81 MG tablet Take 81 mg by mouth daily.   busPIRone (BUSPAR) 5 MG tablet Take 5 mg by mouth 2 (two) times daily. For anxiety   Calcium Carbonate-Vitamin D (CALCIUM-VITAMIN D3 PO) Take 400 Units by mouth daily in the afternoon.   cholestyramine (QUESTRAN) 4 g packet Take 4 g by mouth 2 (two) times daily between meals.   Lactobacillus Probiotic TABS Take 2 tablets by mouth daily.   levothyroxine (SYNTHROID, LEVOTHROID) 25 MCG tablet Take 25 mcg by mouth daily before breakfast.   lipase/protease/amylase (CREON) 36000 UNITS CPEP capsule 36,000-114,000- 180,000 unit; amt: 2; oral Special Instructions: Take 2 capsules with meals With Meals   lipase/protease/amylase (CREON) 36000 UNITS CPEP capsule 36,000-114,000- 180,000 unit; amt: 1; oral Special Instructions: Take one capsule with snacks if eaten between meals. Twice A Day Between Meals   loperamide (IMODIUM A-D) 2 MG tablet Take 4 mg by mouth 3 (three) times daily. For diarrhea   losartan (COZAAR) 50 MG tablet Take 1 tablet (50 mg total) by mouth daily.   NON FORMULARY Regular diet no dairy products.   NON FORMULARY Wanderguard #[id number] to ankle for safety awareness. Check placement and function qshift. Special Instructions: Check placement and function qshift. Every Shift Day, Evening, Night   potassium chloride SA (KLOR-CON) 20 MEQ tablet Take 20 mEq by mouth daily.      SIGNIFICANT DIAGNOSTIC EXAMS   PREVIOUS   08-10-20: t score: -3.758  12-21-20: KUBThere is a mild ileus noted. Mild to moderate increase feces in the colon. No renal stone is seen  12-25-20: KUB  There is a nonspecific bowel gas pattern without obstruction. No residual ileus is noted. Mild increased feces in the colon. No renal stone is seen.  02-05-21: ct of head:  1. No acute intracranial abnormality. Please note if concern for acute infarction MRI would be more  sensitive. 2. Similar appearance of bifrontal encephalomalacia consistent with prior infarction/insult  02-05-21: MRI of brain:  No evidence of recent infarction, hemorrhage, or mass. Bifrontal encephalomalacia/gliosis. Additional chronic microvascular ischemic changes. Questioned small left anterior clinoid process calcified meningioma on prior CT imaging is not well evaluated due to size and lack of contrast.  NO NEW EXAMS.     LABS REVIEWED PREVIOUS    12-25-20: wbc 2.7; hgb 10.1; hct 31.2 mcv 103.0 plt 120; glucose 108; bun 13; creat 1.53; k+ 3.9; na++ 138; ca 8.7 liver normal albumin 3.2 GFR 3 01-01-21: wbc 2.5; hgb 10.6; hct 32.1; mcv 100.6 plt 87; glucose 87; bun 27; creat 1.24; k+ 3.8; na++ 144; ca 9.3 GFR 45; liver normal albumin 3.4  02-05-21: wbc 3.9; hgb  10.2; hct 31.6; mcv 102.9 plt 91; glucose 118; bun 26; creat 1.29; k+ 3.8; na++ 138; ca 8.7 GFR43 liver normal albumin 3.0 urine culture: e-coli 02-07-21: hgb a1c 5.3 04-08-21: chol 142; ldl 66; trig 101; hdl 56 04-09-21: wbc 4.6; hgb 11.3; hct 35.3; mcv 100.6 plt 140; glucose 101; bun 20; creat 1.13; k+ 3.5; na++ 139; ca 8.5 GFR 50; liver normal albumin 3.4  06-18-21: wbc 4.5; hgb 10.6; hct 33.7; mcv 98.5 plt 174; glucose 111; bun 22; creat 1.20 ;k+ 3.7; na++ 141; ca 8.9; GFR 47; liver normal albumin 3.4 07-15-21: wbc 4.7; hgb 11.7; hct 35.9; mcv 97.0 plt 220; glucose 161; bun 21; creat 1.23; k+ 3.9; na++ 138; ca 8.8; GFR 46 liver normal albumin 3.2 uric acid 4.4 08-08-21: urine culture: 70,000 e-coli 08-26-21: wbc 6.3; hgb 11.1; hct 35.3; mcv 99.2 plt 165; glucose 117; bun 16; creat 1.02; k+ 4.0; na++ 141; ca 9.1 GFR 57; liver normal albumin 3.1  09-05-21: hgb a1c 5.9; urine micro-albumin 7.9 10-07-21: wbc 5.6; hgb 10.5; hct 32.5; mcv 100.3 plt 163; glucose 105; bun 25; creat 1.14; k+ 3.8; na++ 138; ca 8.8; GFR 50 liver normal albumin 3.2 10-10-21: chol 153; ldl 81; trig 56; hdl 61; tsh 2.577 11-11-21: wbc 4.6; hgb 10.9; hct 34.5; mcv 98.6  plt 160; glucose 183; bun 25; creat 1.23; k+ 3.8; na++ 143; ca 9.1; GFR 45; liver normal albumin 3.3; mag 2.3   TODAY  12-23-21: wbc 4,1 hgb 11.9; hct 36.8; mcv 98.1 plt 106; glucose 117; bun 20; creat 1.17; k+ 3.4; na++ 142; ca 8.9; GFR 48; liver normal albumin  3.5  mag 2.2   Review of Systems  Constitutional:  Negative for malaise/fatigue.  Respiratory:  Negative for cough and shortness of breath.   Cardiovascular:  Negative for chest pain, palpitations and leg swelling.  Gastrointestinal:  Negative for abdominal pain, constipation and heartburn.  Musculoskeletal:  Negative for back pain, joint pain and myalgias.  Skin: Negative.   Neurological:  Negative for dizziness.  Psychiatric/Behavioral:  The patient is not nervous/anxious.    Physical Exam Constitutional:      General: She is not in acute distress.    Appearance: She is underweight. She is not diaphoretic.  Neck:     Thyroid: No thyromegaly.  Cardiovascular:     Rate and Rhythm: Normal rate and regular rhythm.     Pulses: Normal pulses.     Heart sounds: Normal heart sounds.  Pulmonary:     Effort: Pulmonary effort is normal. No respiratory distress.     Breath sounds: Normal breath sounds.  Abdominal:     General: Bowel sounds are normal. There is no distension.     Palpations: Abdomen is soft.     Tenderness: There is no abdominal tenderness.  Musculoskeletal:        General: Normal range of motion.     Cervical back: Neck supple.     Right lower leg: No edema.     Left lower leg: No edema.  Lymphadenopathy:     Cervical: No cervical adenopathy.  Skin:    General: Skin is warm and dry.  Neurological:     Mental Status: She is alert. Mental status is at baseline.  Psychiatric:        Mood and Affect: Mood normal.        ASSESSMENT/ PLAN:  TODAY  Thrombocytopenia: is stable plt 106 will monitor   2. Multiple myeloma without achieving remission: is without change is  followed by oncology   3. Aortic  atherosclerosis: (ct 08-25-14)   4. First degree av block/bradycardia: is stable has been seen by cardiology in the past   PREVIOUS    5. Pancreatic insufficiency: is stable will continue questran 4 gm twice daily; creon 72,000 units with meals and imodium 2 mg three times daily   6. Macrocytic anemia: stable; hgb 11.9; vit B 12: 620; is off iron (due to diarrhea) will monitor   7. Vascular dementia with behavioral disturbance: is stable weight is 120 pounds; will continue buspar 5 mg twice daily to help with anxiety. Is off aricept   8. Herpes: no recent outbreaks will continue acyclovir 400 mg twice daily   9. Failure to thrive in adult: is stable her weight is 120  pounds; will continue to monitor  10. Hypokalemia: k+ 3.4 will continue k+ 20 meq daily   11. Hypothyroidism, unspecified type: is stable tsh 2.577 will continue synthroid 25 mcg daily   12. Hypertension associated with type 2 diabetes mellitus: is stable b/p 130/74 will continue cozaar 50 mg daily asa 81 mg daily   13. Diabetes mellitus  with stage 3 chronic kidney disease and hypertension: is stable hgb a1c 5.9 is on arb and statin  14. Post menopausal osteoporosis t score -3.758 will continue calcium and vitamin d   15. Compression fracture of L1 vertebrae with routine healing subsequent encounter (06/2017) is stable has prn tylenol     Ok Edwards NP [facility]  call [phone]

## 2022-01-06 ENCOUNTER — Other Ambulatory Visit (HOSPITAL_COMMUNITY)
Admission: RE | Admit: 2022-01-06 | Discharge: 2022-01-06 | Disposition: A | Discharge status: Home / Self Care | Payer: Medicare PPO | Source: Skilled Nursing Facility | Attending: Adult Health | Admitting: Adult Health

## 2022-01-06 ENCOUNTER — Inpatient Hospital Stay (HOSPITAL_COMMUNITY): Payer: Medicare PPO

## 2022-01-06 ENCOUNTER — Other Ambulatory Visit: Payer: Self-pay

## 2022-01-06 DIAGNOSIS — E1122 Type 2 diabetes mellitus with diabetic chronic kidney disease: Secondary | ICD-10-CM | POA: Diagnosis not present

## 2022-01-06 DIAGNOSIS — E1159 Type 2 diabetes mellitus with other circulatory complications: Secondary | ICD-10-CM | POA: Insufficient documentation

## 2022-01-06 DIAGNOSIS — I129 Hypertensive chronic kidney disease with stage 1 through stage 4 chronic kidney disease, or unspecified chronic kidney disease: Secondary | ICD-10-CM | POA: Diagnosis not present

## 2022-01-06 DIAGNOSIS — R197 Diarrhea, unspecified: Secondary | ICD-10-CM | POA: Diagnosis not present

## 2022-01-06 DIAGNOSIS — Z5111 Encounter for antineoplastic chemotherapy: Secondary | ICD-10-CM | POA: Diagnosis not present

## 2022-01-06 DIAGNOSIS — E039 Hypothyroidism, unspecified: Secondary | ICD-10-CM | POA: Diagnosis not present

## 2022-01-06 DIAGNOSIS — C9000 Multiple myeloma not having achieved remission: Secondary | ICD-10-CM | POA: Diagnosis not present

## 2022-01-06 DIAGNOSIS — F0390 Unspecified dementia without behavioral disturbance: Secondary | ICD-10-CM | POA: Diagnosis not present

## 2022-01-06 DIAGNOSIS — E1136 Type 2 diabetes mellitus with diabetic cataract: Secondary | ICD-10-CM | POA: Diagnosis not present

## 2022-01-06 DIAGNOSIS — R112 Nausea with vomiting, unspecified: Secondary | ICD-10-CM | POA: Diagnosis not present

## 2022-01-06 LAB — COMPREHENSIVE METABOLIC PANEL
ALT: 20 U/L (ref 0–44)
AST: 30 U/L (ref 15–41)
Albumin: 3.7 g/dL (ref 3.5–5.0)
Alkaline Phosphatase: 64 U/L (ref 38–126)
Anion gap: 10 (ref 5–15)
BUN: 18 mg/dL (ref 8–23)
CO2: 26 mmol/L (ref 22–32)
Calcium: 9 mg/dL (ref 8.9–10.3)
Chloride: 103 mmol/L (ref 98–111)
Creatinine, Ser: 1.26 mg/dL — ABNORMAL HIGH (ref 0.44–1.00)
GFR, Estimated: 44 mL/min — ABNORMAL LOW (ref >60)
Glucose, Bld: 86 mg/dL (ref 70–99)
Potassium: 3.2 mmol/L — ABNORMAL LOW (ref 3.5–5.1)
Sodium: 139 mmol/L (ref 135–145)
Total Bilirubin: 0.8 mg/dL (ref 0.3–1.2)
Total Protein: 6.9 g/dL (ref 6.5–8.1)

## 2022-01-06 LAB — CBC WITH DIFFERENTIAL/PLATELET
Abs Immature Granulocytes: 0.01 10*3/uL (ref 0.00–0.07)
Basophils Absolute: 0 10*3/uL (ref 0.0–0.1)
Basophils Relative: 0 %
Eosinophils Absolute: 0.1 10*3/uL (ref 0.0–0.5)
Eosinophils Relative: 2 %
HCT: 35.8 % — ABNORMAL LOW (ref 36.0–46.0)
Hemoglobin: 11.8 g/dL — ABNORMAL LOW (ref 12.0–15.0)
Immature Granulocytes: 0 %
Lymphocytes Relative: 16 %
Lymphs Abs: 0.8 10*3/uL (ref 0.7–4.0)
MCH: 31.8 pg (ref 26.0–34.0)
MCHC: 33 g/dL (ref 30.0–36.0)
MCV: 96.5 fL (ref 80.0–100.0)
Monocytes Absolute: 0.5 10*3/uL (ref 0.1–1.0)
Monocytes Relative: 10 %
Neutro Abs: 3.3 10*3/uL (ref 1.7–7.7)
Neutrophils Relative %: 72 %
Platelets: 147 10*3/uL — ABNORMAL LOW (ref 150–400)
RBC: 3.71 MIL/uL — ABNORMAL LOW (ref 3.87–5.11)
RDW: 15.7 % — ABNORMAL HIGH (ref 11.5–15.5)
WBC: 4.6 10*3/uL (ref 4.0–10.5)
nRBC: 0 % (ref 0.0–0.2)

## 2022-01-06 LAB — MAGNESIUM: Magnesium: 2.1 mg/dL (ref 1.7–2.4)

## 2022-01-06 LAB — LACTATE DEHYDROGENASE: LDH: 222 U/L — ABNORMAL HIGH (ref 98–192)

## 2022-01-07 ENCOUNTER — Other Ambulatory Visit (HOSPITAL_COMMUNITY): Payer: Medicare PPO

## 2022-01-07 ENCOUNTER — Encounter (HOSPITAL_COMMUNITY): Payer: Self-pay

## 2022-01-07 ENCOUNTER — Inpatient Hospital Stay (HOSPITAL_COMMUNITY): Payer: Medicare PPO | Attending: Hematology

## 2022-01-07 VITALS — BP 136/81 | HR 70 | Temp 97.4°F | Resp 18 | Ht 69.0 in | Wt 121.2 lb

## 2022-01-07 DIAGNOSIS — C9000 Multiple myeloma not having achieved remission: Secondary | ICD-10-CM | POA: Diagnosis not present

## 2022-01-07 DIAGNOSIS — Z5111 Encounter for antineoplastic chemotherapy: Secondary | ICD-10-CM | POA: Insufficient documentation

## 2022-01-07 LAB — KAPPA/LAMBDA LIGHT CHAINS
Kappa free light chain: 21.1 mg/L — ABNORMAL HIGH (ref 3.3–19.4)
Kappa, lambda light chain ratio: 1.09 (ref 0.26–1.65)
Lambda free light chains: 19.4 mg/L (ref 5.7–26.3)

## 2022-01-07 LAB — PROTEIN ELECTROPHORESIS, SERUM
A/G Ratio: 1 (ref 0.7–1.7)
Albumin ELP: 3.4 g/dL (ref 2.9–4.4)
Alpha-1-Globulin: 0.3 g/dL (ref 0.0–0.4)
Alpha-2-Globulin: 0.8 g/dL (ref 0.4–1.0)
Beta Globulin: 0.9 g/dL (ref 0.7–1.3)
Gamma Globulin: 1.2 g/dL (ref 0.4–1.8)
Globulin, Total: 3.3 g/dL (ref 2.2–3.9)
M-Spike, %: 0.5 g/dL — ABNORMAL HIGH
Total Protein ELP: 6.7 g/dL (ref 6.0–8.5)

## 2022-01-07 LAB — HEMOGLOBIN A1C
Hgb A1c MFr Bld: 5.9 % — ABNORMAL HIGH (ref 4.8–5.6)
Mean Plasma Glucose: 123 mg/dL

## 2022-01-07 MED ORDER — BORTEZOMIB CHEMO SQ INJECTION 3.5 MG (2.5MG/ML)
1.3000 mg/m2 | Freq: Once | INTRAMUSCULAR | Status: AC
  Administered 2022-01-07: 14:00:00 2 mg via SUBCUTANEOUS
  Filled 2022-01-07: qty 0.8

## 2022-01-07 MED ORDER — DEXAMETHASONE 4 MG PO TABS
10.0000 mg | ORAL_TABLET | Freq: Once | ORAL | Status: AC
  Administered 2022-01-07: 13:00:00 10 mg via ORAL
  Filled 2022-01-07: qty 3

## 2022-01-07 NOTE — Progress Notes (Signed)
Patient tolerated Velcade injection with no complaints voiced. Lab work reviewed. See MAR for details. Injection site clean and dry with no bruising or swelling noted. Patient stable during and after injection. Band aid applied. VSS. Patient left in satisfactory condition with no s/s of distress noted. 

## 2022-01-07 NOTE — Patient Instructions (Signed)
[facility]  Discharge Instructions: Thank you for choosing [facility] to provide your oncology and hematology care.  If you have a lab appointment with the [facility], please come in thru the Main Entrance and check in at the main information desk.  Wear comfortable clothing and clothing appropriate for easy access to any Portacath or PICC line.   We strive to give you quality time with your provider. You may need to reschedule your appointment if you arrive late (15 or more minutes).  Arriving late affects you and other patients whose appointments are after yours.  Also, if you miss three or more appointments without notifying the office, you may be dismissed from the clinic at the providers discretion.      For prescription refill requests, have your pharmacy contact our office and allow 72 hours for refills to be completed.    Today you received the following chemotherapy and/or immunotherapy agents velcade.     To help prevent nausea and vomiting after your treatment, we encourage you to take your nausea medication as directed.  BELOW ARE SYMPTOMS THAT SHOULD BE REPORTED IMMEDIATELY: *FEVER GREATER THAN 100.4 F (38 C) OR HIGHER *CHILLS OR SWEATING *NAUSEA AND VOMITING THAT IS NOT CONTROLLED WITH YOUR NAUSEA MEDICATION *UNUSUAL SHORTNESS OF BREATH *UNUSUAL BRUISING OR BLEEDING *URINARY PROBLEMS (pain or burning when urinating, or frequent urination) *BOWEL PROBLEMS (unusual diarrhea, constipation, pain near the anus) TENDERNESS IN MOUTH AND THROAT WITH OR WITHOUT PRESENCE OF ULCERS (sore throat, sores in mouth, or a toothache) UNUSUAL RASH, SWELLING OR PAIN  UNUSUAL VAGINAL DISCHARGE OR ITCHING   Items with * indicate a potential emergency and should be followed up as soon as possible or go to the Emergency Department if any problems should occur.  Please show the CHEMOTHERAPY ALERT CARD or IMMUNOTHERAPY ALERT CARD at check-in to the Emergency  Department and triage nurse.  Should you have questions after your visit or need to cancel or reschedule your appointment, please contact [facility] [phone]  and follow the prompts.  Office hours are 8:00 a.m. to 4:30 p.m. Monday - Friday. Please note that voicemails left after 4:00 p.m. may not be returned until the following business day.  We are closed weekends and major holidays. You have access to a nurse at all times for urgent questions. Please call the main number to the clinic [phone] and follow the prompts.  For any non-urgent questions, you may also contact your provider using MyChart. We now offer e-Visits for anyone 54 and older to request care online for non-urgent symptoms. For details visit [email or web address].   Also download the MyChart app! Go to the app store, search "MyChart", open the app, select [facility], and log in with your MyChart username and password.  Due to Covid, a mask is required upon entering the hospital/clinic. If you do not have a mask, one will be given to you upon arrival. For doctor visits, patients may have 1 support person aged 21 or older with them. For treatment visits, patients cannot have anyone with them due to current Covid guidelines and our immunocompromised population.

## 2022-01-09 ENCOUNTER — Non-Acute Institutional Stay (SKILLED_NURSING_FACILITY): Payer: Medicare PPO | Admitting: Adult Health

## 2022-01-09 ENCOUNTER — Encounter: Payer: Self-pay | Admitting: Adult Health

## 2022-01-09 DIAGNOSIS — C9000 Multiple myeloma not having achieved remission: Secondary | ICD-10-CM

## 2022-01-09 DIAGNOSIS — F01518 Vascular dementia, unspecified severity, with other behavioral disturbance: Secondary | ICD-10-CM

## 2022-01-09 DIAGNOSIS — D696 Thrombocytopenia, unspecified: Secondary | ICD-10-CM

## 2022-01-09 DIAGNOSIS — I700 Atherosclerosis of aorta: Secondary | ICD-10-CM

## 2022-01-09 DIAGNOSIS — Z1159 Encounter for screening for other viral diseases: Secondary | ICD-10-CM | POA: Diagnosis not present

## 2022-01-09 DIAGNOSIS — C8590 Non-Hodgkin lymphoma, unspecified, unspecified site: Secondary | ICD-10-CM | POA: Diagnosis not present

## 2022-01-09 LAB — IMMUNOFIXATION ELECTROPHORESIS
IgA: 64 mg/dL (ref 64–422)
IgG (Immunoglobin G), Serum: 1307 mg/dL (ref 586–1602)
IgM (Immunoglobulin M), Srm: 23 mg/dL — ABNORMAL LOW (ref 26–217)
Total Protein ELP: 6.7 g/dL (ref 6.0–8.5)

## 2022-01-09 NOTE — Progress Notes (Signed)
Location:  [place] Room Number: [place] of Service:  SNF (31) Provider:  Ok Edwards, NP   CODE STATUS: DNR  Allergies  Allergen Reactions   Lotensin [Benazepril]     Chief Complaint  Patient presents with   Acute Visit    Care plan meeting    HPI:  We have come together for her care plan meeting. BIMS 5/15 mood 0/30. She requires supervision to limited assist with her adl care. She is frequently incontinent of bladder and bowel. There have been no falls. Dietary: weight is 121.4 pounds is stable has a good appetite. Therapy: none at this time. She continues to be followed for her chronic illnesses including: Aortic atherosclerosis  Vascular dementia with behavioral disturbance   Thrombocytopenia  Multiple myeloma not having achieved remission  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HCC)    Dementia ([place])    Diabetes mellitus with CKD    H/O partial nephrectomy    Hypokalemia    Hypothyroidism    Impaired cognition    Multiple myeloma (HCC)    Dr Maylon Peppers, [facility]   Thyroid disease     Past Surgical History:  Procedure Laterality Date   ABDOMINAL HYSTERECTOMY     CRANIOTOMY     for lymphoma   YAG LASER APPLICATION Right 0/94/7096   Procedure: YAG LASER APPLICATION;  Surgeon: [name], MD;  Location: [facility];  Service: Ophthalmology;  Laterality: Right;    Social History   Socioeconomic History   Marital status: Single    Spouse name: Not on file   Number of children: Not on file   Years of education: Not on file   Highest education level: Not on file  Occupational History   Occupation: retired   Tobacco Use   Smoking status: Never   Smokeless tobacco: Never  Vaping Use   Vaping Use: Never used  Substance and Sexual Activity   Alcohol use: No   Drug use: No   Sexual activity: Never  Other Topics Concern   Not on file  Social History Narrative    Long term resident of [facility]    Social Determinants of Health   Financial Resource Strain: Not on file  Food Insecurity: Not on file  Transportation Needs: Not on file  Physical Activity: Not on file  Stress: Not on file  Social Connections: Not on file  Intimate Partner Violence: Not on file   Family History  Problem Relation Age of Onset   Depression Mother    Diabetes Mother    Heart disease Father    Cancer - Prostate Brother    Cancer Brother    Cancer Sister       VITAL SIGNS BP (!) 117/53    Pulse 62    Temp 98.5 F (36.9 C)    Resp (!) 21    Ht '5\' 9"'  (1.753 m)    Wt 120 lb 6.4 oz (54.6 kg)    SpO2 97%    BMI 17.78 kg/m   Facility-Administered Encounter Medications as of 01/09/2022  Medication   cyanocobalamin ((VITAMIN B-12)) 1000 MCG/ML injection   cyanocobalamin ((VITAMIN B-12)) 1000 MCG/ML injection   denosumab (XGEVA) 120 MG/1.7ML injection   dexamethasone (DECADRON) 4 MG tablet   dexamethasone (DECADRON) 4 MG tablet   diphenhydrAMINE (BENADRYL) 50 MG/ML injection   diphenhydrAMINE (BENADRYL) 50 MG/ML injection   diphenhydrAMINE (BENADRYL)  50 MG/ML injection   epoetin alfa-epbx (RETACRIT) 91638 UNIT/ML injection   epoetin alfa-epbx (RETACRIT) 46659 UNIT/ML injection   epoetin alfa-epbx (RETACRIT) 93570 UNIT/ML injection   fulvestrant (FASLODEX) 250 MG/5ML injection   magnesium sulfate 2 GM/50ML IVPB   palonosetron (ALOXI) 0.25 MG/5ML injection   palonosetron (ALOXI) 0.25 MG/5ML injection   palonosetron (ALOXI) 0.25 MG/5ML injection   palonosetron (ALOXI) 0.25 MG/5ML injection   potassium chloride 10 MEQ/100ML IVPB   potassium chloride SA (KLOR-CON M) 20 MEQ CR tablet   potassium chloride SA (KLOR-CON M) 20 MEQ CR tablet   prochlorperazine (COMPAZINE) 10 MG tablet   prochlorperazine (COMPAZINE) 10 MG tablet   prochlorperazine (COMPAZINE) tablet 10 mg   Outpatient Encounter Medications as of 01/09/2022  Medication Sig   acetaminophen (TYLENOL) 500 MG  tablet Take 1,000 mg by mouth every 6 (six) hours as needed for mild pain.   acyclovir (ZOVIRAX) 400 MG tablet TAKE 1 TABLET BY MOUTH TWICE DAILY   aspirin EC 81 MG tablet Take 81 mg by mouth daily.   busPIRone (BUSPAR) 10 MG tablet Take by mouth 2 (two) times daily. For anxiety   Calcium Carbonate-Vitamin D (CALCIUM-VITAMIN D3 PO) Take 400 Units by mouth daily in the afternoon.   cholestyramine (QUESTRAN) 4 g packet Take 4 g by mouth 2 (two) times daily between meals.   Lactobacillus Probiotic TABS Take 2 tablets by mouth daily.   levothyroxine (SYNTHROID, LEVOTHROID) 25 MCG tablet Take 25 mcg by mouth daily before breakfast.   lipase/protease/amylase (CREON) 36000 UNITS CPEP capsule 36,000-114,000- 180,000 unit; amt: 2; oral Special Instructions: Take 2 capsules with meals With Meals   lipase/protease/amylase (CREON) 36000 UNITS CPEP capsule 36,000-114,000- 180,000 unit; amt: 1; oral Special Instructions: Take one capsule with snacks if eaten between meals. Twice A Day Between Meals   loperamide (IMODIUM A-D) 2 MG tablet Take 4 mg by mouth 3 (three) times daily. For diarrhea   losartan (COZAAR) 50 MG tablet Take 1 tablet (50 mg total) by mouth daily.   NON FORMULARY Regular diet no dairy products.   NON FORMULARY Wanderguard #[id number] to ankle for safety awareness. Check placement and function qshift. Special Instructions: Check placement and function qshift. Every Shift Day, Evening, Night   potassium chloride SA (KLOR-CON) 20 MEQ tablet Take 20 mEq by mouth daily.      SIGNIFICANT DIAGNOSTIC EXAMS   PREVIOUS   08-10-20: t score: -3.758  12-21-20: KUBThere is a mild ileus noted. Mild to moderate increase feces in the colon. No renal stone is seen  12-25-20: KUB  There is a nonspecific bowel gas pattern without obstruction. No residual ileus is noted. Mild increased feces in the colon. No renal stone is seen.  02-05-21: ct of head:  1. No acute intracranial abnormality. Please note if  concern for acute infarction MRI would be more sensitive. 2. Similar appearance of bifrontal encephalomalacia consistent with prior infarction/insult  02-05-21: MRI of brain:  No evidence of recent infarction, hemorrhage, or mass. Bifrontal encephalomalacia/gliosis. Additional chronic microvascular ischemic changes. Questioned small left anterior clinoid process calcified meningioma on prior CT imaging is not well evaluated due to size and lack of contrast.  NO NEW EXAMS.     LABS REVIEWED PREVIOUS    12-25-20: wbc 2.7; hgb 10.1; hct 31.2 mcv 103.0 plt 120; glucose 108; bun 13; creat 1.53; k+ 3.9; na++ 138; ca 8.7 liver normal albumin 3.2 GFR 3 01-01-21: wbc 2.5; hgb 10.6; hct 32.1; mcv 100.6 plt 87; glucose 87; bun 27;  creat 1.24; k+ 3.8; na++ 144; ca 9.3 GFR 45; liver normal albumin 3.4  02-05-21: wbc 3.9; hgb 10.2; hct 31.6; mcv 102.9 plt 91; glucose 118; bun 26; creat 1.29; k+ 3.8; na++ 138; ca 8.7 GFR43 liver normal albumin 3.0 urine culture: e-coli 02-07-21: hgb a1c 5.3 04-08-21: chol 142; ldl 66; trig 101; hdl 56 04-09-21: wbc 4.6; hgb 11.3; hct 35.3; mcv 100.6 plt 140; glucose 101; bun 20; creat 1.13; k+ 3.5; na++ 139; ca 8.5 GFR 50; liver normal albumin 3.4  06-18-21: wbc 4.5; hgb 10.6; hct 33.7; mcv 98.5 plt 174; glucose 111; bun 22; creat 1.20 ;k+ 3.7; na++ 141; ca 8.9; GFR 47; liver normal albumin 3.4 07-15-21: wbc 4.7; hgb 11.7; hct 35.9; mcv 97.0 plt 220; glucose 161; bun 21; creat 1.23; k+ 3.9; na++ 138; ca 8.8; GFR 46 liver normal albumin 3.2 uric acid 4.4 08-08-21: urine culture: 70,000 e-coli 08-26-21: wbc 6.3; hgb 11.1; hct 35.3; mcv 99.2 plt 165; glucose 117; bun 16; creat 1.02; k+ 4.0; na++ 141; ca 9.1 GFR 57; liver normal albumin 3.1  09-05-21: hgb a1c 5.9; urine micro-albumin 7.9 10-07-21: wbc 5.6; hgb 10.5; hct 32.5; mcv 100.3 plt 163; glucose 105; bun 25; creat 1.14; k+ 3.8; na++ 138; ca 8.8; GFR 50 liver normal albumin 3.2 10-10-21: chol 153; ldl 81; trig 56; hdl 61; tsh  2.577 11-11-21: wbc 4.6; hgb 10.9; hct 34.5; mcv 98.6 plt 160; glucose 183; bun 25; creat 1.23; k+ 3.8; na++ 143; ca 9.1; GFR 45; liver normal albumin 3.3; mag 2.3  12-23-21: wbc 4,1 hgb 11.9; hct 36.8; mcv 98.1 plt 106; glucose 117; bun 20; creat 1.17; k+ 3.4; na++ 142; ca 8.9; GFR 48; liver normal albumin  3.5  mag 2.2   NO NEW LABS.   Review of Systems  Constitutional:  Negative for malaise/fatigue.  Respiratory:  Negative for cough and shortness of breath.   Cardiovascular:  Negative for chest pain, palpitations and leg swelling.  Gastrointestinal:  Negative for abdominal pain, constipation and heartburn.  Musculoskeletal:  Negative for back pain, joint pain and myalgias.  Skin: Negative.   Neurological:  Negative for dizziness.  Psychiatric/Behavioral:  The patient is not nervous/anxious.    Physical Exam Constitutional:      General: She is not in acute distress.    Appearance: She is underweight. She is not diaphoretic.  Neck:     Thyroid: No thyromegaly.  Cardiovascular:     Rate and Rhythm: Normal rate and regular rhythm.     Pulses: Normal pulses.     Heart sounds: Normal heart sounds.  Pulmonary:     Effort: Pulmonary effort is normal. No respiratory distress.     Breath sounds: Normal breath sounds.  Abdominal:     General: Bowel sounds are normal. There is no distension.     Palpations: Abdomen is soft.     Tenderness: There is no abdominal tenderness.  Musculoskeletal:        General: Normal range of motion.     Cervical back: Neck supple.     Right lower leg: No edema.     Left lower leg: No edema.  Lymphadenopathy:     Cervical: No cervical adenopathy.  Skin:    General: Skin is warm and dry.  Neurological:     Mental Status: She is alert. Mental status is at baseline.  Psychiatric:        Mood and Affect: Mood normal.      ASSESSMENT/ PLAN:  TODAY  Aortic atherosclerosis Vascular dementia with behavioral disturbance  Thrombocytopenia Multiple  myeloma not having achieved remission   Will continue current medications Will continue current plan of care Will continue to monitor her status.    Time spent with patient: 40 minutes: plan of care medications.    Ok Edwards NP [facility]  call [phone]

## 2022-01-13 ENCOUNTER — Inpatient Hospital Stay (HOSPITAL_COMMUNITY): Payer: Medicare PPO

## 2022-01-13 ENCOUNTER — Other Ambulatory Visit: Payer: Self-pay

## 2022-01-13 DIAGNOSIS — F0390 Unspecified dementia without behavioral disturbance: Secondary | ICD-10-CM | POA: Diagnosis not present

## 2022-01-13 DIAGNOSIS — R112 Nausea with vomiting, unspecified: Secondary | ICD-10-CM | POA: Diagnosis not present

## 2022-01-13 DIAGNOSIS — C9000 Multiple myeloma not having achieved remission: Secondary | ICD-10-CM | POA: Diagnosis not present

## 2022-01-13 DIAGNOSIS — R197 Diarrhea, unspecified: Secondary | ICD-10-CM | POA: Diagnosis not present

## 2022-01-13 DIAGNOSIS — Z5111 Encounter for antineoplastic chemotherapy: Secondary | ICD-10-CM | POA: Diagnosis not present

## 2022-01-13 DIAGNOSIS — E1122 Type 2 diabetes mellitus with diabetic chronic kidney disease: Secondary | ICD-10-CM | POA: Diagnosis not present

## 2022-01-13 DIAGNOSIS — E039 Hypothyroidism, unspecified: Secondary | ICD-10-CM | POA: Diagnosis not present

## 2022-01-13 DIAGNOSIS — I129 Hypertensive chronic kidney disease with stage 1 through stage 4 chronic kidney disease, or unspecified chronic kidney disease: Secondary | ICD-10-CM | POA: Diagnosis not present

## 2022-01-13 DIAGNOSIS — E1136 Type 2 diabetes mellitus with diabetic cataract: Secondary | ICD-10-CM | POA: Diagnosis not present

## 2022-01-13 LAB — CBC WITH DIFFERENTIAL/PLATELET
Abs Immature Granulocytes: 0.01 10*3/uL (ref 0.00–0.07)
Basophils Absolute: 0 10*3/uL (ref 0.0–0.1)
Basophils Relative: 0 %
Eosinophils Absolute: 0 10*3/uL (ref 0.0–0.5)
Eosinophils Relative: 1 %
HCT: 35.1 % — ABNORMAL LOW (ref 36.0–46.0)
Hemoglobin: 11.2 g/dL — ABNORMAL LOW (ref 12.0–15.0)
Immature Granulocytes: 0 %
Lymphocytes Relative: 15 %
Lymphs Abs: 0.7 10*3/uL (ref 0.7–4.0)
MCH: 31.3 pg (ref 26.0–34.0)
MCHC: 31.9 g/dL (ref 30.0–36.0)
MCV: 98 fL (ref 80.0–100.0)
Monocytes Absolute: 0.6 10*3/uL (ref 0.1–1.0)
Monocytes Relative: 13 %
Neutro Abs: 3.3 10*3/uL (ref 1.7–7.7)
Neutrophils Relative %: 71 %
Platelets: 88 10*3/uL — ABNORMAL LOW (ref 150–400)
RBC: 3.58 MIL/uL — ABNORMAL LOW (ref 3.87–5.11)
RDW: 16.3 % — ABNORMAL HIGH (ref 11.5–15.5)
WBC: 4.7 10*3/uL (ref 4.0–10.5)
nRBC: 0 % (ref 0.0–0.2)

## 2022-01-13 LAB — COMPREHENSIVE METABOLIC PANEL
ALT: 28 U/L (ref 0–44)
AST: 46 U/L — ABNORMAL HIGH (ref 15–41)
Albumin: 3.2 g/dL — ABNORMAL LOW (ref 3.5–5.0)
Alkaline Phosphatase: 65 U/L (ref 38–126)
Anion gap: 6 (ref 5–15)
BUN: 32 mg/dL — ABNORMAL HIGH (ref 8–23)
CO2: 21 mmol/L — ABNORMAL LOW (ref 22–32)
Calcium: 8.5 mg/dL — ABNORMAL LOW (ref 8.9–10.3)
Chloride: 117 mmol/L — ABNORMAL HIGH (ref 98–111)
Creatinine, Ser: 1.46 mg/dL — ABNORMAL HIGH (ref 0.44–1.00)
GFR, Estimated: 37 mL/min — ABNORMAL LOW (ref >60)
Glucose, Bld: 100 mg/dL — ABNORMAL HIGH (ref 70–99)
Potassium: 3.7 mmol/L (ref 3.5–5.1)
Sodium: 144 mmol/L (ref 135–145)
Total Bilirubin: 0.5 mg/dL (ref 0.3–1.2)
Total Protein: 6.4 g/dL — ABNORMAL LOW (ref 6.5–8.1)

## 2022-01-13 LAB — MAGNESIUM: Magnesium: 2.2 mg/dL (ref 1.7–2.4)

## 2022-01-13 LAB — LACTATE DEHYDROGENASE: LDH: 192 U/L (ref 98–192)

## 2022-01-14 ENCOUNTER — Other Ambulatory Visit (HOSPITAL_COMMUNITY): Payer: Medicare PPO

## 2022-01-14 ENCOUNTER — Encounter (HOSPITAL_COMMUNITY): Payer: Self-pay

## 2022-01-14 ENCOUNTER — Inpatient Hospital Stay (HOSPITAL_BASED_OUTPATIENT_CLINIC_OR_DEPARTMENT_OTHER): Payer: Medicare PPO | Admitting: Hematology

## 2022-01-14 ENCOUNTER — Inpatient Hospital Stay (HOSPITAL_COMMUNITY): Payer: Medicare PPO

## 2022-01-14 ENCOUNTER — Encounter (HOSPITAL_COMMUNITY): Payer: Self-pay | Admitting: Hematology

## 2022-01-14 VITALS — BP 158/90 | HR 70 | Temp 98.6°F | Resp 20 | Ht 69.0 in | Wt 116.8 lb

## 2022-01-14 DIAGNOSIS — E1136 Type 2 diabetes mellitus with diabetic cataract: Secondary | ICD-10-CM | POA: Diagnosis not present

## 2022-01-14 DIAGNOSIS — C9000 Multiple myeloma not having achieved remission: Secondary | ICD-10-CM | POA: Diagnosis not present

## 2022-01-14 DIAGNOSIS — E1122 Type 2 diabetes mellitus with diabetic chronic kidney disease: Secondary | ICD-10-CM | POA: Diagnosis not present

## 2022-01-14 DIAGNOSIS — F0390 Unspecified dementia without behavioral disturbance: Secondary | ICD-10-CM | POA: Diagnosis not present

## 2022-01-14 DIAGNOSIS — E039 Hypothyroidism, unspecified: Secondary | ICD-10-CM | POA: Diagnosis not present

## 2022-01-14 DIAGNOSIS — R112 Nausea with vomiting, unspecified: Secondary | ICD-10-CM | POA: Diagnosis not present

## 2022-01-14 DIAGNOSIS — Z5111 Encounter for antineoplastic chemotherapy: Secondary | ICD-10-CM | POA: Diagnosis not present

## 2022-01-14 DIAGNOSIS — I129 Hypertensive chronic kidney disease with stage 1 through stage 4 chronic kidney disease, or unspecified chronic kidney disease: Secondary | ICD-10-CM | POA: Diagnosis not present

## 2022-01-14 DIAGNOSIS — R197 Diarrhea, unspecified: Secondary | ICD-10-CM | POA: Diagnosis not present

## 2022-01-14 LAB — KAPPA/LAMBDA LIGHT CHAINS
Kappa free light chain: 26.3 mg/L — ABNORMAL HIGH (ref 3.3–19.4)
Kappa, lambda light chain ratio: 0.99 (ref 0.26–1.65)
Lambda free light chains: 26.6 mg/L — ABNORMAL HIGH (ref 5.7–26.3)

## 2022-01-14 MED ORDER — DEXAMETHASONE 4 MG PO TABS
10.0000 mg | ORAL_TABLET | Freq: Once | ORAL | Status: AC
  Administered 2022-01-14: 10 mg via ORAL
  Filled 2022-01-14: qty 3

## 2022-01-14 MED ORDER — BORTEZOMIB CHEMO SQ INJECTION 3.5 MG (2.5MG/ML)
1.3000 mg/m2 | Freq: Once | INTRAMUSCULAR | Status: AC
  Administered 2022-01-14: 2 mg via SUBCUTANEOUS
  Filled 2022-01-14: qty 0.8

## 2022-01-14 NOTE — Progress Notes (Signed)
Patient okay for treatment today per Dr. Delton Coombes. Patient tolerated Velcade injection with no complaints voiced. Lab work reviewed. See MAR for details. Injection site clean and dry with no bruising or swelling noted. Patient stable during and after injection. Band aid applied. VSS. Patient left in satisfactory condition with no s/s of distress noted.

## 2022-01-14 NOTE — Patient Instructions (Signed)
[place] at [facility] Discharge Instructions  You were seen and examined today by Dr. Delton Coombes. He reviewed your most recent labs and everything is staying stable. Please keep follow up appointment as scheduled in 2 months.  You received your treatment today.    Thank you for choosing [place] at [facility] to provide your oncology and hematology care.  To afford each patient quality time with our provider, please arrive at least 15 minutes before your scheduled appointment time.   If you have a lab appointment with the [facility] please come in thru the Main Entrance and check in at the main information desk.  You need to re-schedule your appointment should you arrive 10 or more minutes late.  We strive to give you quality time with our providers, and arriving late affects you and other patients whose appointments are after yours.  Also, if you no show three or more times for appointments you may be dismissed from the clinic at the providers discretion.     Again, thank you for choosing [facility]  Our hope is that these requests will decrease the amount of time that you wait before being seen by our physicians.       _____________________________________________________________  Should you have questions after your visit to [facility], please contact our office at [phone] and follow the prompts.  Our office hours are 8:00 a.m. and 4:30 p.m. Monday - Friday.  Please note that voicemails left after 4:00 p.m. may not be returned until the following business day.  We are closed weekends and major holidays.  You do have access to a nurse 24-7, just call the main number to the clinic [phone] and do not press any options, hold on the line and a nurse will answer the phone.    For prescription refill requests, have your pharmacy contact our office and allow 72 hours.    Due to Covid, you will need to wear  a mask upon entering the hospital. If you do not have a mask, a mask will be given to you at the Main Entrance upon arrival. For doctor visits, patients may have 1 support person age 32 or older with them. For treatment visits, patients can not have anyone with them due to social distancing guidelines and our immunocompromised population.

## 2022-01-14 NOTE — Progress Notes (Signed)
[place], [place] [address]   CLINIC:  Medical Oncology/Hematology  PCP:  Gerlene Fee, NP [address] [place] [address] [phone]   REASON FOR VISIT:  Follow-up for multiple myeloma  PRIOR THERAPY: none  NGS Results: not done  CURRENT THERAPY: Velcade & Decadron 3/4 weeks; Revlimid 2/4 weeks  BRIEF ONCOLOGIC HISTORY:  Oncology History  Multiple myeloma ([place])  07/09/2017 Initial Diagnosis   Multiple myeloma ([place])   06/01/2018 -  Chemotherapy   Patient is on Treatment Plan : MYELOMA MAINTENANCE Bortezomib SQ D1,8,15 / Dexamethasone Oral D1,8,15 q28d        CANCER STAGING:  Cancer Staging  No matching staging information was found for the patient.  INTERVAL HISTORY:  Amanda Wu, a 76 y.o. female, returns for routine follow-up and consideration for next cycle of chemotherapy. Amanda Wu was last seen on 11/19/2021.  Due for day #8 cycle #47 of VELCADE today.   Overall, she tells me she has been feeling pretty well. She reports stable diarrhea about 1-2 times weekly which she attributes to dietary choices. She is not currently drinking Boost or Ensure. She denies numbness/tingling. She reports nausea and occasional vomiting.   Overall, she feels ready for next cycle of chemo today.    REVIEW OF SYSTEMS:  Review of Systems  Constitutional:  Negative for appetite change.  Gastrointestinal:  Positive for diarrhea (stable), nausea and vomiting (occasional).  Neurological:  Negative for numbness.  All other systems reviewed and are negative.  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[place])    Dementia ([place])    Diabetes mellitus with CKD    H/O partial nephrectomy    Hypokalemia    Hypothyroidism    Impaired cognition    Multiple myeloma (HCC)    Dr Maylon Peppers, [facility]   Thyroid disease    Past Surgical  History:  Procedure Laterality Date   ABDOMINAL HYSTERECTOMY     CRANIOTOMY     for lymphoma   YAG LASER APPLICATION Right 1/55/2080   Procedure: YAG LASER APPLICATION;  Surgeon: [name], MD;  Location: [facility];  Service: Ophthalmology;  Laterality: Right;    SOCIAL HISTORY:  Social History   Socioeconomic History   Marital status: Single    Spouse name: Not on file   Number of children: Not on file   Years of education: Not on file   Highest education level: Not on file  Occupational History   Occupation: retired   Tobacco Use   Smoking status: Never   Smokeless tobacco: Never  Vaping Use   Vaping Use: Never used  Substance and Sexual Activity   Alcohol use: No   Drug use: No   Sexual activity: Never  Other Topics Concern   Not on file  Social History Narrative   Long term resident of [facility]    Social Determinants of Health   Financial Resource Strain: Not on file  Food Insecurity: Not on file  Transportation Needs: Not on file  Physical Activity: Not on file  Stress: Not on file  Social Connections: Not on file  Intimate Partner Violence: Not on file    FAMILY HISTORY:  Family History  Problem Relation Age of Onset   Depression Mother    Diabetes Mother    Heart disease Father    Cancer - Prostate  Brother    Cancer Brother    Cancer Sister     CURRENT MEDICATIONS:  No current facility-administered medications for this visit.   No current outpatient medications on file.   Facility-Administered Medications Ordered in Other Visits  Medication Dose Route Frequency Provider Last Rate Last Admin   cyanocobalamin ((VITAMIN B-12)) 1000 MCG/ML injection            cyanocobalamin ((VITAMIN B-12)) 1000 MCG/ML injection            denosumab (XGEVA) 120 MG/1.7ML injection            dexamethasone (DECADRON) 4 MG tablet            dexamethasone (DECADRON) 4 MG tablet            diphenhydrAMINE (BENADRYL) 50 MG/ML injection            diphenhydrAMINE  (BENADRYL) 50 MG/ML injection            diphenhydrAMINE (BENADRYL) 50 MG/ML injection            epoetin alfa-epbx (RETACRIT) 94174 UNIT/ML injection            epoetin alfa-epbx (RETACRIT) 08144 UNIT/ML injection            epoetin alfa-epbx (RETACRIT) 81856 UNIT/ML injection            fulvestrant (FASLODEX) 250 MG/5ML injection            magnesium sulfate 2 GM/50ML IVPB            palonosetron (ALOXI) 0.25 MG/5ML injection            palonosetron (ALOXI) 0.25 MG/5ML injection            palonosetron (ALOXI) 0.25 MG/5ML injection            palonosetron (ALOXI) 0.25 MG/5ML injection            potassium chloride 10 MEQ/100ML IVPB            potassium chloride SA (KLOR-CON M) 20 MEQ CR tablet            potassium chloride SA (KLOR-CON M) 20 MEQ CR tablet            prochlorperazine (COMPAZINE) 10 MG tablet            prochlorperazine (COMPAZINE) 10 MG tablet            prochlorperazine (COMPAZINE) tablet 10 mg  10 mg Oral Once Derek Jack, MD        ALLERGIES:  Allergies  Allergen Reactions   Lotensin [Benazepril]     PHYSICAL EXAM:  Performance status (ECOG): 2 - Symptomatic, <50% confined to bed  Vitals:   01/14/22 1329  BP: (!) 158/90  Pulse: 70  Resp: 20  Temp: 98.6 F (37 C)  SpO2: 100%   Wt Readings from Last 3 Encounters:  01/14/22 116 lb 12.8 oz (53 kg)  01/09/22 120 lb 6.4 oz (54.6 kg)  01/07/22 121 lb 3.2 oz (55 kg)   Physical Exam Vitals reviewed.  Constitutional:      Appearance: Normal appearance.  Cardiovascular:     Rate and Rhythm: Normal rate and regular rhythm.     Pulses: Normal pulses.     Heart sounds: Normal heart sounds.  Pulmonary:     Effort: Pulmonary effort is normal.     Breath sounds: Normal breath sounds.  Neurological:     General: No focal deficit present.  Mental Status: She is alert and oriented to person, place, and time.  Psychiatric:        Mood and Affect: Mood normal.        Behavior: Behavior normal.     LABORATORY DATA:  I have reviewed the labs as listed.  CBC Latest Ref Rng & Units 01/13/2022 01/06/2022 12/23/2021  WBC 4.0 - 10.5 K/uL 4.7 4.6 4.1  Hemoglobin 12.0 - 15.0 g/dL 11.2(L) 11.8(L) 11.9(L)  Hematocrit 36.0 - 46.0 % 35.1(L) 35.8(L) 36.8  Platelets 150 - 400 K/uL 88(L) 147(L) 106(L)   CMP Latest Ref Rng & Units 01/13/2022 01/06/2022 12/23/2021  Glucose 70 - 99 mg/dL 100(H) 86 117(H)  BUN 8 - 23 mg/dL 32(H) 18 20  Creatinine 0.44 - 1.00 mg/dL 1.46(H) 1.26(H) 1.17(H)  Sodium 135 - 145 mmol/L 144 139 142  Potassium 3.5 - 5.1 mmol/L 3.7 3.2(L) 3.4(L)  Chloride 98 - 111 mmol/L 117(H) 103 109  CO2 22 - 32 mmol/L 21(L) 26 25  Calcium 8.9 - 10.3 mg/dL 8.5(L) 9.0 8.9  Total Protein 6.5 - 8.1 g/dL 6.4(L) 6.9 6.5  Total Bilirubin 0.3 - 1.2 mg/dL 0.5 0.8 0.5  Alkaline Phos 38 - 126 U/L 65 64 57  AST 15 - 41 U/L 46(H) 30 30  ALT 0 - 44 U/L _0 DIAGNOSTIC IMAGING:  I have independently reviewed the scans and discussed with the patient. No results found.   ASSESSMENT:  1.  IgG lambda multiple myeloma: -She is on Revlimid 10 mg 2 weeks on/2 weeks of along with Velcade 3 weeks on/1 week off.  Dexamethasone 10 mg on days of Velcade. -Myeloma panel on 04/17/2020 shows M spike 0.5 g.  Kappa light chains are 44.  Lambda light chains are 34.  Ratio is 1.3. -Resident of [facility] since 07/19/2020.   2.  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   PLAN:  1.  IgG lambda multiple myeloma: - Reviewed myeloma labs from 01/06/2022.  M spike is stable at 0.5 g.  Immunofixation shows IgG lambda.  Free light chain ratio is normal at 1.09.  Lambda light chains are 19.4. - Continue Velcade on days 1, 8, 15 every 28 days.  Continue dexamethasone 10 mg on Velcade days. - RTC 8 weeks for follow-up with repeat myeloma labs.   2.  CKD: - Baseline creatinine 1.2-1.3.  Today creatinine is 1.4.   3.  Diarrhea: - She has intermittent diarrhea about 1-2 times per  week depending on her diet.  This has improved since we discontinued Revlimid.   4.  Dementia: - Continue Aricept 10 mg daily.   5.  Shingles prophylaxis: - Continue acyclovir 4 mg twice daily.   6.  Myeloma bone disease: - She received Zometa for several years which was discontinued.      Orders placed this encounter:  No orders of the defined types were placed in this encounter.    Derek Jack, MD [place] [phone]   I, Thana Ates, am acting as a scribe for Dr. Derek Jack.  I, Derek Jack MD, have reviewed the above documentation for accuracy and completeness, and I agree with the above.

## 2022-01-14 NOTE — Patient Instructions (Signed)
[facility]  Discharge Instructions: Thank you for choosing [facility] to provide your oncology and hematology care.  If you have a lab appointment with the [facility], please come in thru the Main Entrance and check in at the main information desk.  Wear comfortable clothing and clothing appropriate for easy access to any Portacath or PICC line.   We strive to give you quality time with your provider. You may need to reschedule your appointment if you arrive late (15 or more minutes).  Arriving late affects you and other patients whose appointments are after yours.  Also, if you miss three or more appointments without notifying the office, you may be dismissed from the clinic at the providers discretion.      For prescription refill requests, have your pharmacy contact our office and allow 72 hours for refills to be completed.    Today you received the following chemotherapy and/or immunotherapy agents Velcade, return as scheduled.   To help prevent nausea and vomiting after your treatment, we encourage you to take your nausea medication as directed.  BELOW ARE SYMPTOMS THAT SHOULD BE REPORTED IMMEDIATELY: *FEVER GREATER THAN 100.4 F (38 C) OR HIGHER *CHILLS OR SWEATING *NAUSEA AND VOMITING THAT IS NOT CONTROLLED WITH YOUR NAUSEA MEDICATION *UNUSUAL SHORTNESS OF BREATH *UNUSUAL BRUISING OR BLEEDING *URINARY PROBLEMS (pain or burning when urinating, or frequent urination) *BOWEL PROBLEMS (unusual diarrhea, constipation, pain near the anus) TENDERNESS IN MOUTH AND THROAT WITH OR WITHOUT PRESENCE OF ULCERS (sore throat, sores in mouth, or a toothache) UNUSUAL RASH, SWELLING OR PAIN  UNUSUAL VAGINAL DISCHARGE OR ITCHING   Items with * indicate a potential emergency and should be followed up as soon as possible or go to the Emergency Department if any problems should occur.  Please show the CHEMOTHERAPY ALERT CARD or IMMUNOTHERAPY ALERT CARD at check-in to the  Emergency Department and triage nurse.  Should you have questions after your visit or need to cancel or reschedule your appointment, please contact [facility] [phone]  and follow the prompts.  Office hours are 8:00 a.m. to 4:30 p.m. Monday - Friday. Please note that voicemails left after 4:00 p.m. may not be returned until the following business day.  We are closed weekends and major holidays. You have access to a nurse at all times for urgent questions. Please call the main number to the clinic [phone] and follow the prompts.  For any non-urgent questions, you may also contact your provider using MyChart. We now offer e-Visits for anyone 24 and older to request care online for non-urgent symptoms. For details visit [email or web address].   Also download the MyChart app! Go to the app store, search "MyChart", open the app, select [facility], and log in with your MyChart username and password.  Due to Covid, a mask is required upon entering the hospital/clinic. If you do not have a mask, one will be given to you upon arrival. For doctor visits, patients may have 1 support person aged 78 or older with them. For treatment visits, patients cannot have anyone with them due to current Covid guidelines and our immunocompromised population.

## 2022-01-14 NOTE — Progress Notes (Signed)
Patient has been assessed, vital signs and labs have been reviewed by Dr. Katragadda. ANC, Creatinine, LFTs, and Platelets are within treatment parameters per Dr. Katragadda. The patient is good to proceed with treatment at this time. Primary RN and pharmacy aware.  

## 2022-01-15 LAB — PROTEIN ELECTROPHORESIS, SERUM
A/G Ratio: 1.1 (ref 0.7–1.7)
Albumin ELP: 3.1 g/dL (ref 2.9–4.4)
Alpha-1-Globulin: 0.4 g/dL (ref 0.0–0.4)
Alpha-2-Globulin: 0.8 g/dL (ref 0.4–1.0)
Beta Globulin: 0.7 g/dL (ref 0.7–1.3)
Gamma Globulin: 1 g/dL (ref 0.4–1.8)
Globulin, Total: 2.9 g/dL (ref 2.2–3.9)
M-Spike, %: 0.5 g/dL — ABNORMAL HIGH
Total Protein ELP: 6 g/dL (ref 6.0–8.5)

## 2022-01-16 DIAGNOSIS — Z1159 Encounter for screening for other viral diseases: Secondary | ICD-10-CM | POA: Diagnosis not present

## 2022-01-16 DIAGNOSIS — C8590 Non-Hodgkin lymphoma, unspecified, unspecified site: Secondary | ICD-10-CM | POA: Diagnosis not present

## 2022-01-20 ENCOUNTER — Inpatient Hospital Stay (HOSPITAL_COMMUNITY): Payer: Medicare PPO | Attending: Hematology

## 2022-01-20 DIAGNOSIS — C9000 Multiple myeloma not having achieved remission: Secondary | ICD-10-CM | POA: Insufficient documentation

## 2022-01-20 DIAGNOSIS — Z5111 Encounter for antineoplastic chemotherapy: Secondary | ICD-10-CM | POA: Diagnosis not present

## 2022-01-20 LAB — CBC WITH DIFFERENTIAL/PLATELET
Abs Immature Granulocytes: 0.01 10*3/uL (ref 0.00–0.07)
Basophils Absolute: 0 10*3/uL (ref 0.0–0.1)
Basophils Relative: 0 %
Eosinophils Absolute: 0.1 10*3/uL (ref 0.0–0.5)
Eosinophils Relative: 2 %
HCT: 35.6 % — ABNORMAL LOW (ref 36.0–46.0)
Hemoglobin: 11.7 g/dL — ABNORMAL LOW (ref 12.0–15.0)
Immature Granulocytes: 0 %
Lymphocytes Relative: 16 %
Lymphs Abs: 0.9 10*3/uL (ref 0.7–4.0)
MCH: 30.5 pg (ref 26.0–34.0)
MCHC: 32.9 g/dL (ref 30.0–36.0)
MCV: 92.7 fL (ref 80.0–100.0)
Monocytes Absolute: 0.5 10*3/uL (ref 0.1–1.0)
Monocytes Relative: 8 %
Neutro Abs: 3.9 10*3/uL (ref 1.7–7.7)
Neutrophils Relative %: 74 %
Platelets: 118 10*3/uL — ABNORMAL LOW (ref 150–400)
RBC: 3.84 MIL/uL — ABNORMAL LOW (ref 3.87–5.11)
RDW: 16 % — ABNORMAL HIGH (ref 11.5–15.5)
WBC: 5.3 10*3/uL (ref 4.0–10.5)
nRBC: 0 % (ref 0.0–0.2)

## 2022-01-20 LAB — COMPREHENSIVE METABOLIC PANEL
ALT: 20 U/L (ref 0–44)
AST: 26 U/L (ref 15–41)
Albumin: 3.4 g/dL — ABNORMAL LOW (ref 3.5–5.0)
Alkaline Phosphatase: 64 U/L (ref 38–126)
Anion gap: 7 (ref 5–15)
BUN: 19 mg/dL (ref 8–23)
CO2: 25 mmol/L (ref 22–32)
Calcium: 8.8 mg/dL — ABNORMAL LOW (ref 8.9–10.3)
Chloride: 111 mmol/L (ref 98–111)
Creatinine, Ser: 1.2 mg/dL — ABNORMAL HIGH (ref 0.44–1.00)
GFR, Estimated: 47 mL/min — ABNORMAL LOW (ref >60)
Glucose, Bld: 119 mg/dL — ABNORMAL HIGH (ref 70–99)
Potassium: 3.4 mmol/L — ABNORMAL LOW (ref 3.5–5.1)
Sodium: 143 mmol/L (ref 135–145)
Total Bilirubin: 0.4 mg/dL (ref 0.3–1.2)
Total Protein: 6.6 g/dL (ref 6.5–8.1)

## 2022-01-20 LAB — MAGNESIUM: Magnesium: 2.1 mg/dL (ref 1.7–2.4)

## 2022-01-20 NOTE — Progress Notes (Signed)
Results reviewed by Dr. Delton Coombes at visit

## 2022-01-20 NOTE — Progress Notes (Signed)
Results reviewed by DR. Katragadda at visit.

## 2022-01-21 ENCOUNTER — Inpatient Hospital Stay (HOSPITAL_COMMUNITY): Payer: Medicare PPO

## 2022-01-21 VITALS — BP 130/76 | HR 65 | Temp 98.4°F | Resp 18 | Ht 69.0 in | Wt 116.8 lb

## 2022-01-21 DIAGNOSIS — C9000 Multiple myeloma not having achieved remission: Secondary | ICD-10-CM

## 2022-01-21 DIAGNOSIS — Z5111 Encounter for antineoplastic chemotherapy: Secondary | ICD-10-CM | POA: Diagnosis not present

## 2022-01-21 MED ORDER — DEXAMETHASONE 4 MG PO TABS
10.0000 mg | ORAL_TABLET | Freq: Once | ORAL | Status: AC
  Administered 2022-01-21: 10 mg via ORAL
  Filled 2022-01-21: qty 3

## 2022-01-21 MED ORDER — BORTEZOMIB CHEMO SQ INJECTION 3.5 MG (2.5MG/ML)
1.3000 mg/m2 | Freq: Once | INTRAMUSCULAR | Status: AC
  Administered 2022-01-21: 2 mg via SUBCUTANEOUS
  Filled 2022-01-21: qty 0.8

## 2022-01-21 NOTE — Progress Notes (Signed)
Patient presents today for Velcade injection.  Patient is in satisfactory condition with no complaints voiced.  Vital signs are stable.  Labs reviewed and all labs are within treatment parameters.  We will proceed with treatment per MD orders.   Patient tolerated injection with no complaints voiced.  Site clean and dry with no bruising or swelling noted.  No complaints of pain.  Discharged via wheelchair with [facility] nurse tech with vital signs stable and no signs or symptoms of distress noted.

## 2022-01-21 NOTE — Patient Instructions (Signed)
[facility]  Discharge Instructions: Thank you for choosing [facility] to provide your oncology and hematology care.  If you have a lab appointment with the Cancer Center, please come in thru the Main Entrance and check in at the main information desk.  Wear comfortable clothing and clothing appropriate for easy access to any Portacath or PICC line.   We strive to give you quality time with your provider. You may need to reschedule your appointment if you arrive late (15 or more minutes).  Arriving late affects you and other patients whose appointments are after yours.  Also, if you miss three or more appointments without notifying the office, you may be dismissed from the clinic at the provider's discretion.      For prescription refill requests, have your pharmacy contact our office and allow 72 hours for refills to be completed.        To help prevent nausea and vomiting after your treatment, we encourage you to take your nausea medication as directed.  BELOW ARE SYMPTOMS THAT SHOULD BE REPORTED IMMEDIATELY: *FEVER GREATER THAN 100.4 F (38 C) OR HIGHER *CHILLS OR SWEATING *NAUSEA AND VOMITING THAT IS NOT CONTROLLED WITH YOUR NAUSEA MEDICATION *UNUSUAL SHORTNESS OF BREATH *UNUSUAL BRUISING OR BLEEDING *URINARY PROBLEMS (pain or burning when urinating, or frequent urination) *BOWEL PROBLEMS (unusual diarrhea, constipation, pain near the anus) TENDERNESS IN MOUTH AND THROAT WITH OR WITHOUT PRESENCE OF ULCERS (sore throat, sores in mouth, or a toothache) UNUSUAL RASH, SWELLING OR PAIN  UNUSUAL VAGINAL DISCHARGE OR ITCHING   Items with * indicate a potential emergency and should be followed up as soon as possible or go to the Emergency Department if any problems should occur.  Please show the CHEMOTHERAPY ALERT CARD or IMMUNOTHERAPY ALERT CARD at check-in to the Emergency Department and triage nurse.  Should you have questions after your visit or need to cancel  or reschedule your appointment, please contact [facility] [phone]  and follow the prompts.  Office hours are 8:00 a.m. to 4:30 p.m. Monday - Friday. Please note that voicemails left after 4:00 p.m. may not be returned until the following business day.  We are closed weekends and major holidays. You have access to a nurse at all times for urgent questions. Please call the main number to the clinic [phone] and follow the prompts.  For any non-urgent questions, you may also contact your provider using MyChart. We now offer e-Visits for anyone 18 and older to request care online for non-urgent symptoms. For details visit [email or web address].   Also download the MyChart app! Go to the app store, search "MyChart", open the app, select [facility], and log in with your MyChart username and password.  Due to Covid, a mask is required upon entering the hospital/clinic. If you do not have a mask, one will be given to you upon arrival. For doctor visits, patients may have 1 support person aged 18 or older with them. For treatment visits, patients cannot have anyone with them due to current Covid guidelines and our immunocompromised population.  

## 2022-01-23 DIAGNOSIS — Z1159 Encounter for screening for other viral diseases: Secondary | ICD-10-CM | POA: Diagnosis not present

## 2022-01-23 DIAGNOSIS — C8590 Non-Hodgkin lymphoma, unspecified, unspecified site: Secondary | ICD-10-CM | POA: Diagnosis not present

## 2022-01-30 DIAGNOSIS — Z1159 Encounter for screening for other viral diseases: Secondary | ICD-10-CM | POA: Diagnosis not present

## 2022-01-30 DIAGNOSIS — C8590 Non-Hodgkin lymphoma, unspecified, unspecified site: Secondary | ICD-10-CM | POA: Diagnosis not present

## 2022-02-03 ENCOUNTER — Inpatient Hospital Stay (HOSPITAL_COMMUNITY): Payer: Medicare PPO

## 2022-02-03 ENCOUNTER — Non-Acute Institutional Stay (SKILLED_NURSING_FACILITY): Payer: Medicare PPO | Admitting: Adult Health

## 2022-02-03 ENCOUNTER — Encounter: Payer: Self-pay | Admitting: Adult Health

## 2022-02-03 DIAGNOSIS — Z5111 Encounter for antineoplastic chemotherapy: Secondary | ICD-10-CM | POA: Diagnosis not present

## 2022-02-03 DIAGNOSIS — B009 Herpesviral infection, unspecified: Secondary | ICD-10-CM | POA: Diagnosis not present

## 2022-02-03 DIAGNOSIS — K8689 Other specified diseases of pancreas: Secondary | ICD-10-CM | POA: Diagnosis not present

## 2022-02-03 DIAGNOSIS — C9000 Multiple myeloma not having achieved remission: Secondary | ICD-10-CM

## 2022-02-03 DIAGNOSIS — F01518 Vascular dementia, unspecified severity, with other behavioral disturbance: Secondary | ICD-10-CM

## 2022-02-03 LAB — CBC WITH DIFFERENTIAL/PLATELET
Abs Immature Granulocytes: 0.02 10*3/uL (ref 0.00–0.07)
Basophils Absolute: 0 10*3/uL (ref 0.0–0.1)
Basophils Relative: 1 %
Eosinophils Absolute: 0.1 10*3/uL (ref 0.0–0.5)
Eosinophils Relative: 2 %
HCT: 33.6 % — ABNORMAL LOW (ref 36.0–46.0)
Hemoglobin: 10.9 g/dL — ABNORMAL LOW (ref 12.0–15.0)
Immature Granulocytes: 0 %
Lymphocytes Relative: 11 %
Lymphs Abs: 0.5 10*3/uL — ABNORMAL LOW (ref 0.7–4.0)
MCH: 32 pg (ref 26.0–34.0)
MCHC: 32.4 g/dL (ref 30.0–36.0)
MCV: 98.5 fL (ref 80.0–100.0)
Monocytes Absolute: 0.4 10*3/uL (ref 0.1–1.0)
Monocytes Relative: 9 %
Neutro Abs: 3.5 10*3/uL (ref 1.7–7.7)
Neutrophils Relative %: 77 %
Platelets: 142 10*3/uL — ABNORMAL LOW (ref 150–400)
RBC: 3.41 MIL/uL — ABNORMAL LOW (ref 3.87–5.11)
RDW: 16.3 % — ABNORMAL HIGH (ref 11.5–15.5)
WBC: 4.5 10*3/uL (ref 4.0–10.5)
nRBC: 0 % (ref 0.0–0.2)

## 2022-02-03 LAB — COMPREHENSIVE METABOLIC PANEL
ALT: 16 U/L (ref 0–44)
AST: 27 U/L (ref 15–41)
Albumin: 3.3 g/dL — ABNORMAL LOW (ref 3.5–5.0)
Alkaline Phosphatase: 54 U/L (ref 38–126)
Anion gap: 8 (ref 5–15)
BUN: 24 mg/dL — ABNORMAL HIGH (ref 8–23)
CO2: 27 mmol/L (ref 22–32)
Calcium: 8.9 mg/dL (ref 8.9–10.3)
Chloride: 107 mmol/L (ref 98–111)
Creatinine, Ser: 1.27 mg/dL — ABNORMAL HIGH (ref 0.44–1.00)
GFR, Estimated: 44 mL/min — ABNORMAL LOW (ref >60)
Glucose, Bld: 132 mg/dL — ABNORMAL HIGH (ref 70–99)
Potassium: 3.7 mmol/L (ref 3.5–5.1)
Sodium: 142 mmol/L (ref 135–145)
Total Bilirubin: 0.5 mg/dL (ref 0.3–1.2)
Total Protein: 6.2 g/dL — ABNORMAL LOW (ref 6.5–8.1)

## 2022-02-03 LAB — MAGNESIUM: Magnesium: 2.5 mg/dL — ABNORMAL HIGH (ref 1.7–2.4)

## 2022-02-03 NOTE — Progress Notes (Signed)
Location:  [facility] Room Number: 148-W Place of Service:  SNF (31)   CODE STATUS: DNR  Allergies  Allergen Reactions   Lotensin [Benazepril]    Chief Complaint  Patient presents with   Medical Management of Chronic Issues            Pancreatic insufficiency  Macrocytic anemia:  Vascular dementia with behavioral disturbance: Herpes:      HPI:  Amanda Wu is a 76 year old long term resident of this facility being seen for the management of her chronic illnesses:  Pancreatic insufficiency  Macrocytic anemia:  Vascular dementia with behavioral disturbance: Herpes. There are no reports of uncontrolled pain. There are no reports of diarrhea or gastric distress.   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[place])    Dementia ([place])    Diabetes mellitus with CKD    H/O partial nephrectomy    Hypokalemia    Hypothyroidism    Impaired cognition    Multiple myeloma (H[facility])    Dr Maylon Peppers, [facility]   Thyroid disease     Past Surgical History:  Procedure Laterality Date   ABDOMINAL HYSTERECTOMY     CRANIOTOMY     for lymphoma   YAG LASER APPLICATION Right 1/66/0630   Procedure: YAG LASER APPLICATION;  Surgeon: [name], MD;  Location: [facility];  Service: Ophthalmology;  Laterality: Right;    Social History   Socioeconomic History   Marital status: Single    Spouse name: Not on file   Number of children: Not on file   Years of education: Not on file   Highest education level: Not on file  Occupational History   Occupation: retired   Tobacco Use   Smoking status: Never   Smokeless tobacco: Never  Vaping Use   Vaping Use: Never used  Substance and Sexual Activity   Alcohol use: No   Drug use: No   Sexual activity: Never  Other Topics Concern   Not on file  Social History Narrative   Long term resident of [facility]    Social Determinants of Health   Financial Resource Strain: Not on  file  Food Insecurity: Not on file  Transportation Needs: Not on file  Physical Activity: Not on file  Stress: Not on file  Social Connections: Not on file  Intimate Partner Violence: Not on file   Family History  Problem Relation Age of Onset   Depression Mother    Diabetes Mother    Heart disease Father    Cancer - Prostate Brother    Cancer Brother    Cancer Sister       VITAL SIGNS BP 120/62    Pulse 86    Temp 98.5 F (36.9 C)    Resp 20    Ht '5\' 9"'  (1.753 m)    Wt 119 lb 3.2 oz (54.1 kg)    SpO2 97%    BMI 17.60 kg/m   Facility-Administered Encounter Medications as of 02/03/2022  Medication   cyanocobalamin ((VITAMIN B-12)) 1000 MCG/ML injection   cyanocobalamin ((VITAMIN B-12)) 1000 MCG/ML injection   denosumab (XGEVA) 120 MG/1.7ML injection   dexamethasone (DECADRON) 4 MG tablet   dexamethasone (DECADRON) 4 MG tablet   diphenhydrAMINE (BENADRYL) 50 MG/ML injection   diphenhydrAMINE (BENADRYL) 50 MG/ML injection   diphenhydrAMINE (BENADRYL) 50 MG/ML injection   epoetin alfa-epbx (RETACRIT) 16010 UNIT/ML injection   epoetin alfa-epbx (RETACRIT)  20000 UNIT/ML injection   epoetin alfa-epbx (RETACRIT) 68341 UNIT/ML injection   fulvestrant (FASLODEX) 250 MG/5ML injection   magnesium sulfate 2 GM/50ML IVPB   palonosetron (ALOXI) 0.25 MG/5ML injection   palonosetron (ALOXI) 0.25 MG/5ML injection   palonosetron (ALOXI) 0.25 MG/5ML injection   palonosetron (ALOXI) 0.25 MG/5ML injection   potassium chloride 10 MEQ/100ML IVPB   potassium chloride SA (KLOR-CON M) 20 MEQ CR tablet   potassium chloride SA (KLOR-CON M) 20 MEQ CR tablet   prochlorperazine (COMPAZINE) 10 MG tablet   prochlorperazine (COMPAZINE) 10 MG tablet   prochlorperazine (COMPAZINE) tablet 10 mg   Outpatient Encounter Medications as of 02/03/2022  Medication Sig   acetaminophen (TYLENOL) 500 MG tablet Take 1,000 mg by mouth every 6 (six) hours as needed for mild pain.   acyclovir (ZOVIRAX) 400 MG tablet  TAKE 1 TABLET BY MOUTH TWICE DAILY   aspirin EC 81 MG tablet Take 81 mg by mouth daily.   busPIRone (BUSPAR) 10 MG tablet Take by mouth 2 (two) times daily. For anxiety   Calcium Carbonate-Vitamin D (CALCIUM-VITAMIN D3 PO) Take 400 Units by mouth daily in the afternoon.   cholestyramine (QUESTRAN) 4 g packet Take 4 g by mouth 2 (two) times daily between meals.   Lactobacillus Probiotic TABS Take 2 tablets by mouth daily.   levothyroxine (SYNTHROID, LEVOTHROID) 25 MCG tablet Take 25 mcg by mouth daily before breakfast.   lipase/protease/amylase (CREON) 36000 UNITS CPEP capsule 36,000-114,000- 180,000 unit; amt: 2; oral Special Instructions: Take 2 capsules with meals With Meals   lipase/protease/amylase (CREON) 36000 UNITS CPEP capsule 36,000-114,000- 180,000 unit; amt: 1; oral Special Instructions: Take one capsule with snacks if eaten between meals. Twice A Day Between Meals   loperamide (IMODIUM A-D) 2 MG tablet Take 4 mg by mouth 3 (three) times daily. For diarrhea   losartan (COZAAR) 50 MG tablet Take 1 tablet (50 mg total) by mouth daily.   NON FORMULARY Regular diet no dairy products.   NON FORMULARY Wanderguard #[id number] to ankle for safety awareness. Check placement and function qshift. Special Instructions: Check placement and function qshift. Every Shift Day, Evening, Night   potassium chloride SA (KLOR-CON) 20 MEQ tablet Take 20 mEq by mouth daily.      SIGNIFICANT DIAGNOSTIC EXAMS   PREVIOUS   08-10-20: t score: -3.758  02-05-21: ct of head:  1. No acute intracranial abnormality. Please note if concern for acute infarction MRI would be more sensitive. 2. Similar appearance of bifrontal encephalomalacia consistent with prior infarction/insult  02-05-21: MRI of brain:  No evidence of recent infarction, hemorrhage, or mass. Bifrontal encephalomalacia/gliosis. Additional chronic microvascular ischemic changes. Questioned small left anterior clinoid process calcified meningioma  on prior CT imaging is not well evaluated due to size and lack of contrast.  NO NEW EXAMS.     LABS REVIEWED PREVIOUS    02-05-21: wbc 3.9; hgb 10.2; hct 31.6; mcv 102.9 plt 91; glucose 118; bun 26; creat 1.29; k+ 3.8; na++ 138; ca 8.7 GFR43 liver normal albumin 3.0 urine culture: e-coli 02-07-21: hgb a1c 5.3 04-08-21: chol 142; ldl 66; trig 101; hdl 56 04-09-21: wbc 4.6; hgb 11.3; hct 35.3; mcv 100.6 plt 140; glucose 101; bun 20; creat 1.13; k+ 3.5; na++ 139; ca 8.5 GFR 50; liver normal albumin 3.4  06-18-21: wbc 4.5; hgb 10.6; hct 33.7; mcv 98.5 plt 174; glucose 111; bun 22; creat 1.20 ;k+ 3.7; na++ 141; ca 8.9; GFR 47; liver normal albumin 3.4 07-15-21: wbc 4.7; hgb 11.7; hct 35.9;  mcv 97.0 plt 220; glucose 161; bun 21; creat 1.23; k+ 3.9; na++ 138; ca 8.8; GFR 46 liver normal albumin 3.2 uric acid 4.4 08-08-21: urine culture: 70,000 e-coli 08-26-21: wbc 6.3; hgb 11.1; hct 35.3; mcv 99.2 plt 165; glucose 117; bun 16; creat 1.02; k+ 4.0; na++ 141; ca 9.1 GFR 57; liver normal albumin 3.1  09-05-21: hgb a1c 5.9; urine micro-albumin 7.9 10-07-21: wbc 5.6; hgb 10.5; hct 32.5; mcv 100.3 plt 163; glucose 105; bun 25; creat 1.14; k+ 3.8; na++ 138; ca 8.8; GFR 50 liver normal albumin 3.2 10-10-21: chol 153; ldl 81; trig 56; hdl 61; tsh 2.577 11-11-21: wbc 4.6; hgb 10.9; hct 34.5; mcv 98.6 plt 160; glucose 183; bun 25; creat 1.23; k+ 3.8; na++ 143; ca 9.1; GFR 45; liver normal albumin 3.3; mag 2.3  12-23-21: wbc 4,1 hgb 11.9; hct 36.8; mcv 98.1 plt 106; glucose 117; bun 20; creat 1.17; k+ 3.4; na++ 142; ca 8.9; GFR 48; liver normal albumin  3.5  mag 2.2   NO NEW LABS.   Review of Systems  Constitutional:  Negative for malaise/fatigue.  Respiratory:  Negative for cough and shortness of breath.   Cardiovascular:  Negative for chest pain, palpitations and leg swelling.  Gastrointestinal:  Negative for abdominal pain, constipation and heartburn.  Musculoskeletal:  Negative for back pain, joint pain and myalgias.   Skin: Negative.   Neurological:  Negative for dizziness.  Psychiatric/Behavioral:  The patient is not nervous/anxious.    Physical Exam Constitutional:      General: Amanda Wu is not in acute distress.    Appearance: Amanda Wu is underweight. Amanda Wu is not diaphoretic.  Neck:     Thyroid: No thyromegaly.  Cardiovascular:     Rate and Rhythm: Normal rate and regular rhythm.     Pulses: Normal pulses.     Heart sounds: Normal heart sounds.  Pulmonary:     Effort: Pulmonary effort is normal. No respiratory distress.     Breath sounds: Normal breath sounds.  Abdominal:     General: Bowel sounds are normal. There is no distension.     Palpations: Abdomen is soft.     Tenderness: There is no abdominal tenderness.  Musculoskeletal:        General: Normal range of motion.     Cervical back: Neck supple.     Right lower leg: No edema.     Left lower leg: No edema.  Lymphadenopathy:     Cervical: No cervical adenopathy.  Skin:    General: Skin is warm and dry.  Neurological:     Mental Status: Amanda Wu is alert. Mental status is at baseline.  Psychiatric:        Mood and Affect: Mood normal.       ASSESSMENT/ PLAN:  TODAY  Pancreatic insufficiency is stable will continue questran 4 gm twice daily creon 72,000 units with meals and imodium 2 mg three times daily   2. Macrocytic anemia: is stable hgb 11.9; vit B 12: 620; is off iron due to diarrhea will monitor   3. Vascular dementia with behavioral disturbance: weight is 119 pounds; will continue buspar 5 mg twice daily to help with mood state is off aricept   4. Herpes: no recent outbreaks will continue acyclovir 400 mg twice daily   PREVIOUS    5. Failure to thrive in adult: is stable her weight is 120  pounds; will continue to monitor  6. Hypokalemia: k+ 3.4 will continue k+ 20 meq daily   7. Hypothyroidism,  unspecified type: is stable tsh 2.577 will continue synthroid 25 mcg daily   8. Hypertension associated with type 2 diabetes  mellitus: is stable b/p 130/74 will continue cozaar 50 mg daily asa 81 mg daily   9. Diabetes mellitus  with stage 3 chronic kidney disease and hypertension: is stable hgb a1c 5.9 is on arb and statin  10. Post menopausal osteoporosis t score -3.758 will continue calcium and vitamin d   11. Compression fracture of L1 vertebrae with routine healing subsequent encounter (06/2017) is stable has prn tylenol   12. Thrombocytopenia: is stable plt 106 will monitor   13. Multiple myeloma without achieving remission: is without change is followed by oncology   14. Aortic atherosclerosis: (ct 08-25-14) is on statin   15. First degree av block/bradycardia: is stable has been seen by cardiology in the past     Ok Edwards NP [facility]  call [phone]

## 2022-02-04 ENCOUNTER — Ambulatory Visit (HOSPITAL_COMMUNITY): Payer: Medicare PPO

## 2022-02-04 ENCOUNTER — Inpatient Hospital Stay (HOSPITAL_COMMUNITY): Payer: Medicare PPO

## 2022-02-06 DIAGNOSIS — C8590 Non-Hodgkin lymphoma, unspecified, unspecified site: Secondary | ICD-10-CM | POA: Diagnosis not present

## 2022-02-06 DIAGNOSIS — Z1159 Encounter for screening for other viral diseases: Secondary | ICD-10-CM | POA: Diagnosis not present

## 2022-02-10 ENCOUNTER — Inpatient Hospital Stay (HOSPITAL_COMMUNITY): Payer: Medicare PPO

## 2022-02-10 ENCOUNTER — Other Ambulatory Visit (HOSPITAL_COMMUNITY)
Admission: RE | Admit: 2022-02-10 | Discharge: 2022-02-10 | Disposition: A | Discharge status: Home / Self Care | Payer: Medicare PPO | Source: Skilled Nursing Facility | Attending: Adult Health | Admitting: Adult Health

## 2022-02-10 ENCOUNTER — Telehealth (HOSPITAL_COMMUNITY): Payer: Self-pay | Admitting: *Deleted

## 2022-02-10 DIAGNOSIS — Z5111 Encounter for antineoplastic chemotherapy: Secondary | ICD-10-CM | POA: Diagnosis not present

## 2022-02-10 DIAGNOSIS — C9000 Multiple myeloma not having achieved remission: Secondary | ICD-10-CM | POA: Insufficient documentation

## 2022-02-10 LAB — COMPREHENSIVE METABOLIC PANEL
ALT: 13 U/L (ref 0–44)
AST: 25 U/L (ref 15–41)
Albumin: 3 g/dL — ABNORMAL LOW (ref 3.5–5.0)
Alkaline Phosphatase: 53 U/L (ref 38–126)
Anion gap: 9 (ref 5–15)
BUN: 23 mg/dL (ref 8–23)
CO2: 23 mmol/L (ref 22–32)
Calcium: 8.8 mg/dL — ABNORMAL LOW (ref 8.9–10.3)
Chloride: 110 mmol/L (ref 98–111)
Creatinine, Ser: 1.2 mg/dL — ABNORMAL HIGH (ref 0.44–1.00)
GFR, Estimated: 47 mL/min — ABNORMAL LOW (ref >60)
Glucose, Bld: 114 mg/dL — ABNORMAL HIGH (ref 70–99)
Potassium: 3.6 mmol/L (ref 3.5–5.1)
Sodium: 142 mmol/L (ref 135–145)
Total Bilirubin: 0.4 mg/dL (ref 0.3–1.2)
Total Protein: 6.2 g/dL — ABNORMAL LOW (ref 6.5–8.1)

## 2022-02-10 LAB — CBC WITH DIFFERENTIAL/PLATELET
Abs Immature Granulocytes: 0.01 10*3/uL (ref 0.00–0.07)
Basophils Absolute: 0 10*3/uL (ref 0.0–0.1)
Basophils Relative: 0 %
Eosinophils Absolute: 0.1 10*3/uL (ref 0.0–0.5)
Eosinophils Relative: 3 %
HCT: 31.7 % — ABNORMAL LOW (ref 36.0–46.0)
Hemoglobin: 10.3 g/dL — ABNORMAL LOW (ref 12.0–15.0)
Immature Granulocytes: 0 %
Lymphocytes Relative: 15 %
Lymphs Abs: 0.5 10*3/uL — ABNORMAL LOW (ref 0.7–4.0)
MCH: 31.9 pg (ref 26.0–34.0)
MCHC: 32.5 g/dL (ref 30.0–36.0)
MCV: 98.1 fL (ref 80.0–100.0)
Monocytes Absolute: 0.4 10*3/uL (ref 0.1–1.0)
Monocytes Relative: 12 %
Neutro Abs: 2.1 10*3/uL (ref 1.7–7.7)
Neutrophils Relative %: 70 %
Platelets: 116 10*3/uL — ABNORMAL LOW (ref 150–400)
RBC: 3.23 MIL/uL — ABNORMAL LOW (ref 3.87–5.11)
RDW: 16.6 % — ABNORMAL HIGH (ref 11.5–15.5)
WBC: 3.1 10*3/uL — ABNORMAL LOW (ref 4.0–10.5)
nRBC: 0 % (ref 0.0–0.2)

## 2022-02-10 LAB — MAGNESIUM: Magnesium: 2.2 mg/dL (ref 1.7–2.4)

## 2022-02-10 NOTE — Telephone Encounter (Signed)
Patient is being quarantined at [facility] for possible COVID.  Has tested negative thus far, however will need to be tested again before appointment tomorrow.  They will draw her CBCD, CMET and Magnesium today and will advise if she is positive to reschedule upcoming appointment.

## 2022-02-11 ENCOUNTER — Ambulatory Visit (HOSPITAL_COMMUNITY): Payer: Medicare PPO

## 2022-02-11 ENCOUNTER — Telehealth (HOSPITAL_COMMUNITY): Payer: Self-pay | Admitting: *Deleted

## 2022-02-11 ENCOUNTER — Encounter (HOSPITAL_COMMUNITY): Payer: Self-pay | Admitting: Hematology

## 2022-02-11 NOTE — Telephone Encounter (Signed)
Called and verified patient's appointments for next week.  Spoke to [place] at the [facility]. Appointment cancelled for today, as she is being quarantined due to roommate  having covid.  She continues to test negative and will be out of quarantine until this Friday and will resume Velcade next week, assuming that she does not test positive between now and then.

## 2022-02-13 DIAGNOSIS — Z1159 Encounter for screening for other viral diseases: Secondary | ICD-10-CM | POA: Diagnosis not present

## 2022-02-13 DIAGNOSIS — C8590 Non-Hodgkin lymphoma, unspecified, unspecified site: Secondary | ICD-10-CM | POA: Diagnosis not present

## 2022-02-17 ENCOUNTER — Inpatient Hospital Stay (HOSPITAL_COMMUNITY): Payer: Medicare PPO | Attending: Hematology

## 2022-02-17 DIAGNOSIS — C9000 Multiple myeloma not having achieved remission: Secondary | ICD-10-CM | POA: Insufficient documentation

## 2022-02-17 DIAGNOSIS — Z5111 Encounter for antineoplastic chemotherapy: Secondary | ICD-10-CM | POA: Insufficient documentation

## 2022-02-17 LAB — CBC WITH DIFFERENTIAL/PLATELET
Abs Immature Granulocytes: 0.01 10*3/uL (ref 0.00–0.07)
Basophils Absolute: 0 10*3/uL (ref 0.0–0.1)
Basophils Relative: 1 %
Eosinophils Absolute: 0.1 10*3/uL (ref 0.0–0.5)
Eosinophils Relative: 3 %
HCT: 34.7 % — ABNORMAL LOW (ref 36.0–46.0)
Hemoglobin: 11.1 g/dL — ABNORMAL LOW (ref 12.0–15.0)
Immature Granulocytes: 0 %
Lymphocytes Relative: 12 %
Lymphs Abs: 0.5 10*3/uL — ABNORMAL LOW (ref 0.7–4.0)
MCH: 32.1 pg (ref 26.0–34.0)
MCHC: 32 g/dL (ref 30.0–36.0)
MCV: 100.3 fL — ABNORMAL HIGH (ref 80.0–100.0)
Monocytes Absolute: 0.4 10*3/uL (ref 0.1–1.0)
Monocytes Relative: 10 %
Neutro Abs: 3.3 10*3/uL (ref 1.7–7.7)
Neutrophils Relative %: 74 %
Platelets: 119 10*3/uL — ABNORMAL LOW (ref 150–400)
RBC: 3.46 MIL/uL — ABNORMAL LOW (ref 3.87–5.11)
RDW: 17.2 % — ABNORMAL HIGH (ref 11.5–15.5)
WBC: 4.4 10*3/uL (ref 4.0–10.5)
nRBC: 0 % (ref 0.0–0.2)

## 2022-02-17 LAB — COMPREHENSIVE METABOLIC PANEL
ALT: 15 U/L (ref 0–44)
AST: 30 U/L (ref 15–41)
Albumin: 3.4 g/dL — ABNORMAL LOW (ref 3.5–5.0)
Alkaline Phosphatase: 54 U/L (ref 38–126)
Anion gap: 8 (ref 5–15)
BUN: 21 mg/dL (ref 8–23)
CO2: 24 mmol/L (ref 22–32)
Calcium: 8.9 mg/dL (ref 8.9–10.3)
Chloride: 109 mmol/L (ref 98–111)
Creatinine, Ser: 1.22 mg/dL — ABNORMAL HIGH (ref 0.44–1.00)
GFR, Estimated: 46 mL/min — ABNORMAL LOW (ref >60)
Glucose, Bld: 110 mg/dL — ABNORMAL HIGH (ref 70–99)
Potassium: 3.5 mmol/L (ref 3.5–5.1)
Sodium: 141 mmol/L (ref 135–145)
Total Bilirubin: 0.4 mg/dL (ref 0.3–1.2)
Total Protein: 6.7 g/dL (ref 6.5–8.1)

## 2022-02-17 LAB — MAGNESIUM: Magnesium: 2.2 mg/dL (ref 1.7–2.4)

## 2022-02-18 ENCOUNTER — Encounter (HOSPITAL_COMMUNITY): Payer: Self-pay

## 2022-02-18 ENCOUNTER — Inpatient Hospital Stay (HOSPITAL_COMMUNITY): Payer: Medicare PPO

## 2022-02-18 VITALS — BP 107/51 | HR 60 | Temp 97.1°F | Resp 17 | Ht 69.0 in | Wt 124.2 lb

## 2022-02-18 DIAGNOSIS — Z5111 Encounter for antineoplastic chemotherapy: Secondary | ICD-10-CM | POA: Diagnosis not present

## 2022-02-18 DIAGNOSIS — C9000 Multiple myeloma not having achieved remission: Secondary | ICD-10-CM | POA: Diagnosis not present

## 2022-02-18 MED ORDER — DEXAMETHASONE 4 MG PO TABS
10.0000 mg | ORAL_TABLET | Freq: Once | ORAL | Status: AC
  Administered 2022-02-18: 10 mg via ORAL
  Filled 2022-02-18: qty 3

## 2022-02-18 MED ORDER — BORTEZOMIB CHEMO SQ INJECTION 3.5 MG (2.5MG/ML)
1.3000 mg/m2 | Freq: Once | INTRAMUSCULAR | Status: AC
  Administered 2022-02-18: 2 mg via SUBCUTANEOUS
  Filled 2022-02-18: qty 0.8

## 2022-02-18 NOTE — Patient Instructions (Signed)
[facility]  Discharge Instructions: ?Thank you for choosing [facility] to provide your oncology and hematology care.  ?If you have a lab appointment with the [facility], please come in thru the Main Entrance and check in at the main information desk. ? ?Wear comfortable clothing and clothing appropriate for easy access to any Portacath or PICC line.  ? ?We strive to give you quality time with your provider. You may need to reschedule your appointment if you arrive late (15 or more minutes).  Arriving late affects you and other patients whose appointments are after yours.  Also, if you miss three or more appointments without notifying the office, you may be dismissed from the clinic at the provider?s discretion.    ?  ?For prescription refill requests, have your pharmacy contact our office and allow 72 hours for refills to be completed.   ? ?Today you received the following chemotherapy and/or immunotherapy agents Velcade, return as scheduled. ?  ?To help prevent nausea and vomiting after your treatment, we encourage you to take your nausea medication as directed. ? ?BELOW ARE SYMPTOMS THAT SHOULD BE REPORTED IMMEDIATELY: ?*FEVER GREATER THAN 100.4 F (38 ?C) OR HIGHER ?*CHILLS OR SWEATING ?*NAUSEA AND VOMITING THAT IS NOT CONTROLLED WITH YOUR NAUSEA MEDICATION ?*UNUSUAL SHORTNESS OF BREATH ?*UNUSUAL BRUISING OR BLEEDING ?*URINARY PROBLEMS (pain or burning when urinating, or frequent urination) ?*BOWEL PROBLEMS (unusual diarrhea, constipation, pain near the anus) ?TENDERNESS IN MOUTH AND THROAT WITH OR WITHOUT PRESENCE OF ULCERS (sore throat, sores in mouth, or a toothache) ?UNUSUAL RASH, SWELLING OR PAIN  ?UNUSUAL VAGINAL DISCHARGE OR ITCHING  ? ?Items with * indicate a potential emergency and should be followed up as soon as possible or go to the Emergency Department if any problems should occur. ? ?Please show the CHEMOTHERAPY ALERT CARD or IMMUNOTHERAPY ALERT CARD at check-in to the  Emergency Department and triage nurse. ? ?Should you have questions after your visit or need to cancel or reschedule your appointment, please contact [facility] [phone]  and follow the prompts.  Office hours are 8:00 a.m. to 4:30 p.m. Monday - Friday. Please note that voicemails left after 4:00 p.m. may not be returned until the following business day.  We are closed weekends and major holidays. You have access to a nurse at all times for urgent questions. Please call the main number to the clinic [phone] and follow the prompts. ? ?For any non-urgent questions, you may also contact your provider using MyChart. We now offer e-Visits for anyone 35 and older to request care online for non-urgent symptoms. For details visit [email or web address]. ?  ?Also download the MyChart app! Go to the app store, search "MyChart", open the app, select [facility], and log in with your MyChart username and password. ? ?Due to Covid, a mask is required upon entering the hospital/clinic. If you do not have a mask, one will be given to you upon arrival. For doctor visits, patients may have 1 support person aged 27 or older with them. For treatment visits, patients cannot have anyone with them due to current Covid guidelines and our immunocompromised population.  ?

## 2022-02-18 NOTE — Progress Notes (Signed)
Patient tolerated Velcade injection with no complaints voiced. Lab work reviewed. See MAR for details. Injection site clean and dry with no bruising or swelling noted. Patient stable during and after injection. Band aid applied. VSS. Patient left in satisfactory condition with no s/s of distress noted. 

## 2022-02-18 NOTE — Progress Notes (Signed)
Has follow up 02/18/22

## 2022-02-24 ENCOUNTER — Inpatient Hospital Stay (HOSPITAL_COMMUNITY): Payer: Medicare PPO

## 2022-02-24 ENCOUNTER — Encounter (HOSPITAL_COMMUNITY): Payer: Self-pay

## 2022-02-24 DIAGNOSIS — Z5111 Encounter for antineoplastic chemotherapy: Secondary | ICD-10-CM | POA: Diagnosis not present

## 2022-02-24 DIAGNOSIS — C9000 Multiple myeloma not having achieved remission: Secondary | ICD-10-CM | POA: Diagnosis not present

## 2022-02-24 LAB — CBC WITH DIFFERENTIAL/PLATELET
Abs Immature Granulocytes: 0.01 10*3/uL (ref 0.00–0.07)
Basophils Absolute: 0 10*3/uL (ref 0.0–0.1)
Basophils Relative: 1 %
Eosinophils Absolute: 0.1 10*3/uL (ref 0.0–0.5)
Eosinophils Relative: 2 %
HCT: 34.6 % — ABNORMAL LOW (ref 36.0–46.0)
Hemoglobin: 11.1 g/dL — ABNORMAL LOW (ref 12.0–15.0)
Immature Granulocytes: 0 %
Lymphocytes Relative: 12 %
Lymphs Abs: 0.6 10*3/uL — ABNORMAL LOW (ref 0.7–4.0)
MCH: 31.8 pg (ref 26.0–34.0)
MCHC: 32.1 g/dL (ref 30.0–36.0)
MCV: 99.1 fL (ref 80.0–100.0)
Monocytes Absolute: 0.6 10*3/uL (ref 0.1–1.0)
Monocytes Relative: 11 %
Neutro Abs: 3.7 10*3/uL (ref 1.7–7.7)
Neutrophils Relative %: 74 %
Platelets: 95 10*3/uL — ABNORMAL LOW (ref 150–400)
RBC: 3.49 MIL/uL — ABNORMAL LOW (ref 3.87–5.11)
RDW: 16.5 % — ABNORMAL HIGH (ref 11.5–15.5)
WBC: 5.1 10*3/uL (ref 4.0–10.5)
nRBC: 0 % (ref 0.0–0.2)

## 2022-02-24 LAB — COMPREHENSIVE METABOLIC PANEL
ALT: 13 U/L (ref 0–44)
AST: 24 U/L (ref 15–41)
Albumin: 3.4 g/dL — ABNORMAL LOW (ref 3.5–5.0)
Alkaline Phosphatase: 53 U/L (ref 38–126)
Anion gap: 8 (ref 5–15)
BUN: 25 mg/dL — ABNORMAL HIGH (ref 8–23)
CO2: 24 mmol/L (ref 22–32)
Calcium: 9 mg/dL (ref 8.9–10.3)
Chloride: 112 mmol/L — ABNORMAL HIGH (ref 98–111)
Creatinine, Ser: 1.32 mg/dL — ABNORMAL HIGH (ref 0.44–1.00)
GFR, Estimated: 42 mL/min — ABNORMAL LOW (ref >60)
Glucose, Bld: 104 mg/dL — ABNORMAL HIGH (ref 70–99)
Potassium: 3.6 mmol/L (ref 3.5–5.1)
Sodium: 144 mmol/L (ref 135–145)
Total Bilirubin: 0.6 mg/dL (ref 0.3–1.2)
Total Protein: 6.7 g/dL (ref 6.5–8.1)

## 2022-02-24 LAB — MAGNESIUM: Magnesium: 2.4 mg/dL (ref 1.7–2.4)

## 2022-02-24 LAB — LACTATE DEHYDROGENASE: LDH: 196 U/L — ABNORMAL HIGH (ref 98–192)

## 2022-02-25 ENCOUNTER — Inpatient Hospital Stay (HOSPITAL_COMMUNITY): Payer: Medicare PPO

## 2022-02-25 VITALS — BP 149/91 | HR 65 | Temp 97.5°F | Resp 18 | Ht 69.0 in | Wt 121.0 lb

## 2022-02-25 DIAGNOSIS — Z5111 Encounter for antineoplastic chemotherapy: Secondary | ICD-10-CM | POA: Diagnosis not present

## 2022-02-25 DIAGNOSIS — C9000 Multiple myeloma not having achieved remission: Secondary | ICD-10-CM

## 2022-02-25 LAB — KAPPA/LAMBDA LIGHT CHAINS
Kappa free light chain: 23.2 mg/L — ABNORMAL HIGH (ref 3.3–19.4)
Kappa, lambda light chain ratio: 1.08 (ref 0.26–1.65)
Lambda free light chains: 21.4 mg/L (ref 5.7–26.3)

## 2022-02-25 MED ORDER — BORTEZOMIB CHEMO SQ INJECTION 3.5 MG (2.5MG/ML)
1.3000 mg/m2 | Freq: Once | INTRAMUSCULAR | Status: AC
  Administered 2022-02-25: 2 mg via SUBCUTANEOUS
  Filled 2022-02-25: qty 0.8

## 2022-02-25 MED ORDER — DEXAMETHASONE 6 MG PO TABS
10.0000 mg | ORAL_TABLET | Freq: Once | ORAL | Status: AC
  Administered 2022-02-25: 10 mg via ORAL
  Filled 2022-02-25: qty 1

## 2022-02-25 NOTE — Patient Instructions (Signed)
[facility]  Discharge Instructions: Thank you for choosing [facility] to provide your oncology and hematology care.  If you have a lab appointment with the Cancer Center, please come in thru the Main Entrance and check in at the main information desk.  Wear comfortable clothing and clothing appropriate for easy access to any Portacath or PICC line.   We strive to give you quality time with your provider. You may need to reschedule your appointment if you arrive late (15 or more minutes).  Arriving late affects you and other patients whose appointments are after yours.  Also, if you miss three or more appointments without notifying the office, you may be dismissed from the clinic at the provider's discretion.      For prescription refill requests, have your pharmacy contact our office and allow 72 hours for refills to be completed.        To help prevent nausea and vomiting after your treatment, we encourage you to take your nausea medication as directed.  BELOW ARE SYMPTOMS THAT SHOULD BE REPORTED IMMEDIATELY: *FEVER GREATER THAN 100.4 F (38 C) OR HIGHER *CHILLS OR SWEATING *NAUSEA AND VOMITING THAT IS NOT CONTROLLED WITH YOUR NAUSEA MEDICATION *UNUSUAL SHORTNESS OF BREATH *UNUSUAL BRUISING OR BLEEDING *URINARY PROBLEMS (pain or burning when urinating, or frequent urination) *BOWEL PROBLEMS (unusual diarrhea, constipation, pain near the anus) TENDERNESS IN MOUTH AND THROAT WITH OR WITHOUT PRESENCE OF ULCERS (sore throat, sores in mouth, or a toothache) UNUSUAL RASH, SWELLING OR PAIN  UNUSUAL VAGINAL DISCHARGE OR ITCHING   Items with * indicate a potential emergency and should be followed up as soon as possible or go to the Emergency Department if any problems should occur.  Please show the CHEMOTHERAPY ALERT CARD or IMMUNOTHERAPY ALERT CARD at check-in to the Emergency Department and triage nurse.  Should you have questions after your visit or need to cancel  or reschedule your appointment, please contact [facility] [phone]  and follow the prompts.  Office hours are 8:00 a.m. to 4:30 p.m. Monday - Friday. Please note that voicemails left after 4:00 p.m. may not be returned until the following business day.  We are closed weekends and major holidays. You have access to a nurse at all times for urgent questions. Please call the main number to the clinic [phone] and follow the prompts.  For any non-urgent questions, you may also contact your provider using MyChart. We now offer e-Visits for anyone 18 and older to request care online for non-urgent symptoms. For details visit [email or web address].   Also download the MyChart app! Go to the app store, search "MyChart", open the app, select [facility], and log in with your MyChart username and password.  Due to Covid, a mask is required upon entering the hospital/clinic. If you do not have a mask, one will be given to you upon arrival. For doctor visits, patients may have 1 support person aged 18 or older with them. For treatment visits, patients cannot have anyone with them due to current Covid guidelines and our immunocompromised population.  

## 2022-02-25 NOTE — Progress Notes (Signed)
Patient presents today for Velcade injection. Labs done on 02/24/2022 . Platelets 95. Reported to A. Ouida Sills RN / Dr. Delton Coombes through secure chat. Message received to proceed with treatment today per Dr. Delton Coombes / A. Beckie Salts.  ?

## 2022-02-25 NOTE — Progress Notes (Signed)
Patient tolerated Velcade injection well with no complaints voiced.  Patient left via wheelchair with aide in stable condition.  Vital signs stable at discharge.  Follow up as scheduled.    ?

## 2022-02-26 LAB — PROTEIN ELECTROPHORESIS, SERUM
A/G Ratio: 1.1 (ref 0.7–1.7)
Albumin ELP: 3.1 g/dL (ref 2.9–4.4)
Alpha-1-Globulin: 0.2 g/dL (ref 0.0–0.4)
Alpha-2-Globulin: 0.7 g/dL (ref 0.4–1.0)
Beta Globulin: 0.8 g/dL (ref 0.7–1.3)
Gamma Globulin: 1.1 g/dL (ref 0.4–1.8)
Globulin, Total: 2.8 g/dL (ref 2.2–3.9)
M-Spike, %: 0.6 g/dL — ABNORMAL HIGH
Total Protein ELP: 5.9 g/dL — ABNORMAL LOW (ref 6.0–8.5)

## 2022-03-03 ENCOUNTER — Inpatient Hospital Stay (HOSPITAL_COMMUNITY): Payer: Medicare PPO

## 2022-03-03 ENCOUNTER — Other Ambulatory Visit (HOSPITAL_COMMUNITY): Payer: Medicare PPO

## 2022-03-03 DIAGNOSIS — C9000 Multiple myeloma not having achieved remission: Secondary | ICD-10-CM

## 2022-03-03 DIAGNOSIS — Z5111 Encounter for antineoplastic chemotherapy: Secondary | ICD-10-CM | POA: Diagnosis not present

## 2022-03-03 LAB — COMPREHENSIVE METABOLIC PANEL
ALT: 17 U/L (ref 0–44)
AST: 25 U/L (ref 15–41)
Albumin: 3.5 g/dL (ref 3.5–5.0)
Alkaline Phosphatase: 55 U/L (ref 38–126)
Anion gap: 9 (ref 5–15)
BUN: 18 mg/dL (ref 8–23)
CO2: 27 mmol/L (ref 22–32)
Calcium: 8.9 mg/dL (ref 8.9–10.3)
Chloride: 105 mmol/L (ref 98–111)
Creatinine, Ser: 1.21 mg/dL — ABNORMAL HIGH (ref 0.44–1.00)
GFR, Estimated: 46 mL/min — ABNORMAL LOW (ref >60)
Glucose, Bld: 108 mg/dL — ABNORMAL HIGH (ref 70–99)
Potassium: 3.5 mmol/L (ref 3.5–5.1)
Sodium: 141 mmol/L (ref 135–145)
Total Bilirubin: 0.7 mg/dL (ref 0.3–1.2)
Total Protein: 6.7 g/dL (ref 6.5–8.1)

## 2022-03-03 LAB — CBC WITH DIFFERENTIAL/PLATELET
Abs Immature Granulocytes: 0.01 10*3/uL (ref 0.00–0.07)
Basophils Absolute: 0 10*3/uL (ref 0.0–0.1)
Basophils Relative: 1 %
Eosinophils Absolute: 0.1 10*3/uL (ref 0.0–0.5)
Eosinophils Relative: 2 %
HCT: 35.9 % — ABNORMAL LOW (ref 36.0–46.0)
Hemoglobin: 11.5 g/dL — ABNORMAL LOW (ref 12.0–15.0)
Immature Granulocytes: 0 %
Lymphocytes Relative: 12 %
Lymphs Abs: 0.5 10*3/uL — ABNORMAL LOW (ref 0.7–4.0)
MCH: 30.8 pg (ref 26.0–34.0)
MCHC: 32 g/dL (ref 30.0–36.0)
MCV: 96.2 fL (ref 80.0–100.0)
Monocytes Absolute: 0.4 10*3/uL (ref 0.1–1.0)
Monocytes Relative: 10 %
Neutro Abs: 3.2 10*3/uL (ref 1.7–7.7)
Neutrophils Relative %: 75 %
Platelets: 105 10*3/uL — ABNORMAL LOW (ref 150–400)
RBC: 3.73 MIL/uL — ABNORMAL LOW (ref 3.87–5.11)
RDW: 16.4 % — ABNORMAL HIGH (ref 11.5–15.5)
WBC: 4.2 10*3/uL (ref 4.0–10.5)
nRBC: 0 % (ref 0.0–0.2)

## 2022-03-03 LAB — LACTATE DEHYDROGENASE: LDH: 207 U/L — ABNORMAL HIGH (ref 98–192)

## 2022-03-03 LAB — MAGNESIUM: Magnesium: 2.2 mg/dL (ref 1.7–2.4)

## 2022-03-04 ENCOUNTER — Ambulatory Visit (HOSPITAL_COMMUNITY): Payer: Medicare PPO

## 2022-03-04 ENCOUNTER — Inpatient Hospital Stay (HOSPITAL_COMMUNITY): Payer: Medicare PPO

## 2022-03-04 VITALS — BP 142/79 | HR 64 | Temp 97.2°F | Resp 18

## 2022-03-04 DIAGNOSIS — Z5111 Encounter for antineoplastic chemotherapy: Secondary | ICD-10-CM | POA: Diagnosis not present

## 2022-03-04 DIAGNOSIS — C9000 Multiple myeloma not having achieved remission: Secondary | ICD-10-CM

## 2022-03-04 LAB — KAPPA/LAMBDA LIGHT CHAINS
Kappa free light chain: 19.1 mg/L (ref 3.3–19.4)
Kappa, lambda light chain ratio: 0.98 (ref 0.26–1.65)
Lambda free light chains: 19.4 mg/L (ref 5.7–26.3)

## 2022-03-04 MED ORDER — BORTEZOMIB CHEMO SQ INJECTION 3.5 MG (2.5MG/ML)
1.3000 mg/m2 | Freq: Once | INTRAMUSCULAR | Status: AC
  Administered 2022-03-04: 2 mg via SUBCUTANEOUS
  Filled 2022-03-04: qty 0.8

## 2022-03-04 MED ORDER — DEXAMETHASONE 6 MG PO TABS
10.0000 mg | ORAL_TABLET | Freq: Once | ORAL | Status: AC
  Administered 2022-03-04: 10 mg via ORAL
  Filled 2022-03-04: qty 1

## 2022-03-04 NOTE — Progress Notes (Signed)
Amanda Wu presents today for Velcade injection per the provider's orders.  Stable during administration without incident; injection site WNL; see MAR for injection details.  Patient tolerated procedure well and without incident.  No questions or complaints noted at this time. Discharge from clinic via wheelchair in stable condition.  Alert and oriented X 3.  Follow up with [facility] as scheduled.  ?

## 2022-03-04 NOTE — Patient Instructions (Signed)
[facility]  Discharge Instructions: ?Thank you for choosing [facility] to provide your oncology and hematology care.  ?If you have a lab appointment with the [facility], please come in thru the Main Entrance and check in at the main information desk. ? ?Wear comfortable clothing and clothing appropriate for easy access to any Portacath or PICC line.  ? ?We strive to give you quality time with your provider. You may need to reschedule your appointment if you arrive late (15 or more minutes).  Arriving late affects you and other patients whose appointments are after yours.  Also, if you miss three or more appointments without notifying the office, you may be dismissed from the clinic at the provider?s discretion.    ?  ?For prescription refill requests, have your pharmacy contact our office and allow 72 hours for refills to be completed.   ? ?Today you received the following chemotherapy and/or immunotherapy agents Velcade    ?  ?To help prevent nausea and vomiting after your treatment, we encourage you to take your nausea medication as directed. ? ?BELOW ARE SYMPTOMS THAT SHOULD BE REPORTED IMMEDIATELY: ?*FEVER GREATER THAN 100.4 F (38 ?C) OR HIGHER ?*CHILLS OR SWEATING ?*NAUSEA AND VOMITING THAT IS NOT CONTROLLED WITH YOUR NAUSEA MEDICATION ?*UNUSUAL SHORTNESS OF BREATH ?*UNUSUAL BRUISING OR BLEEDING ?*URINARY PROBLEMS (pain or burning when urinating, or frequent urination) ?*BOWEL PROBLEMS (unusual diarrhea, constipation, pain near the anus) ?TENDERNESS IN MOUTH AND THROAT WITH OR WITHOUT PRESENCE OF ULCERS (sore throat, sores in mouth, or a toothache) ?UNUSUAL RASH, SWELLING OR PAIN  ?UNUSUAL VAGINAL DISCHARGE OR ITCHING  ? ?Items with * indicate a potential emergency and should be followed up as soon as possible or go to the Emergency Department if any problems should occur. ? ?Please show the CHEMOTHERAPY ALERT CARD or IMMUNOTHERAPY ALERT CARD at check-in to the Emergency  Department and triage nurse. ? ?Should you have questions after your visit or need to cancel or reschedule your appointment, please contact [facility] [phone]  and follow the prompts.  Office hours are 8:00 a.m. to 4:30 p.m. Monday - Friday. Please note that voicemails left after 4:00 p.m. may not be returned until the following business day.  We are closed weekends and major holidays. You have access to a nurse at all times for urgent questions. Please call the main number to the clinic [phone] and follow the prompts. ? ?For any non-urgent questions, you may also contact your provider using MyChart. We now offer e-Visits for anyone 62 and older to request care online for non-urgent symptoms. For details visit [email or web address]. ?  ?Also download the MyChart app! Go to the app store, search "MyChart", open the app, select [facility], and log in with your MyChart username and password. ? ?Due to Covid, a mask is required upon entering the hospital/clinic. If you do not have a mask, one will be given to you upon arrival. For doctor visits, patients may have 1 support person aged 75 or older with them. For treatment visits, patients cannot have anyone with them due to current Covid guidelines and our immunocompromised population.  ?

## 2022-03-05 ENCOUNTER — Non-Acute Institutional Stay (SKILLED_NURSING_FACILITY): Payer: Medicare PPO | Admitting: Internal Medicine

## 2022-03-05 DIAGNOSIS — E039 Hypothyroidism, unspecified: Secondary | ICD-10-CM | POA: Diagnosis not present

## 2022-03-05 DIAGNOSIS — F01518 Vascular dementia, unspecified severity, with other behavioral disturbance: Secondary | ICD-10-CM

## 2022-03-05 DIAGNOSIS — E1122 Type 2 diabetes mellitus with diabetic chronic kidney disease: Secondary | ICD-10-CM

## 2022-03-05 DIAGNOSIS — N183 Chronic kidney disease, stage 3 unspecified: Secondary | ICD-10-CM | POA: Diagnosis not present

## 2022-03-05 DIAGNOSIS — I129 Hypertensive chronic kidney disease with stage 1 through stage 4 chronic kidney disease, or unspecified chronic kidney disease: Secondary | ICD-10-CM | POA: Diagnosis not present

## 2022-03-05 LAB — PROTEIN ELECTROPHORESIS, SERUM
A/G Ratio: 1.1 (ref 0.7–1.7)
Albumin ELP: 3.2 g/dL (ref 2.9–4.4)
Alpha-1-Globulin: 0.2 g/dL (ref 0.0–0.4)
Alpha-2-Globulin: 0.8 g/dL (ref 0.4–1.0)
Beta Globulin: 0.8 g/dL (ref 0.7–1.3)
Gamma Globulin: 1.1 g/dL (ref 0.4–1.8)
Globulin, Total: 2.9 g/dL (ref 2.2–3.9)
M-Spike, %: 0.6 g/dL — ABNORMAL HIGH
Total Protein ELP: 6.1 g/dL (ref 6.0–8.5)

## 2022-03-05 NOTE — Progress Notes (Signed)
? ?NURSING HOME LOCATION:  [place] ?ROOM NUMBER:  71 W ? ?CODE STATUS:  DNR ? ?PIR:JJOACZY Green NP,PSC ? ?This is a nursing facility follow up visit of chronic medical diagnoses & to document compliance with Regulation 483.30 (c) in [employer] [place] Phase [employer] which mandates caregiver visit ( visits can alternate among physician, PA or NP as per statutes) within 10 days of 30 days / 60 days/ 90 days post admission to SNF date   ? ?Interim medical record and care since last SNF visit was updated with review of diagnostic studies and change in clinical status since last visit were documented. ? ?HPI: She is a permanent resident of this facility with medical diagnoses of essential hypertension, CNS lymphoma, dementia, diabetes with CKD, history of partial nephrectomy, multiple myeloma, and hypothyroidism. ? ?Labs are current as she is on active treatment for her myeloma.  On 03/03/2022 creatinine was 1.21 and GFR 46.  LDH was elevated at 207 but was stable.  Chronic anemia is also relatively stable with current H/H of 11.5/35.9.  Macrocytosis is present with a value of 105.  A1c was 5.9% on January 23.  TSH was therapeutic at 2.58 on 10/10/2021. ? ?Review of systems: Dementia invalidated responses.  When asked how she was doing the response was "not so well".  She could not elaborate.  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  She is concerned because "people coming to me and not telling me the truth".  Apparently she is upset as she cannot get family members to take her to church .[place] [employer] ?She does describe bowel changes as diarrhea, loose and watery. ?She kept mentioning "[employer]" which apparently is a church.  She states her father is a Company secretary there.  Obviously he has been long dead.  She also stated that her sister who was at dialysis was also at [facility] today.  The majority of the interview she held the church photo album on her lap and pointed out  individuals she feels are brothers and sisters.. ? ?Physical exam:  ?Pertinent or positive findings: She is thin and appears suboptimally nourished.  As noted she is pleasantly demented.  She has almost a staring countenance as eyes are wide open.  There is slight exotropia of the right eye intermittently.  She is missing mandibular teeth.  She has an upper plate.  There is slight asymmetry to the nasolabial folds.  She has minor low-grade rales on auscultation.  The heart rhythm is slightly irregular.  Pedal pulses are decreased.  There is trace edema at the sock line.  Limbs are atrophic.  She is wearing an antiwandering bracelet at the right ankle.  Strength to opposition is fair.  She exhibits interosseous wasting of the hands. ? ?General appearance: no acute distress, increased work of breathing is present.   ?Lymphatic: No lymphadenopathy about the head, neck, axilla. ?Eyes: No conjunctival inflammation or lid edema is present. There is no scleral icterus. ?Ears:  External ear exam shows no significant lesions or deformities.   ?Nose:  External nasal examination shows no deformity or inflammation. Nasal mucosa are pink and moist without lesions, exudates ?Oral exam: There is no oropharyngeal erythema or exudate. ?Neck:  No thyromegaly, masses, tenderness noted.    ?Heart:  No gallop, murmur, click, rub .  ?Lungs:  without wheezes, rhonchi, rubs. ?Abdomen: Bowel sounds are normal. Abdomen is soft and nontender with no organomegaly, hernias, masses. ?GU:  Deferred  ?Extremities:  No cyanosis, clubbing  ?Neurologic exam :Balance, Rhomberg, finger to nose testing could not be completed due to clinical state ?Skin: Warm & dry w/o tenting. ?No significant lesions or rash. ? ?See summary under each active problem in the Problem List with associated updated therapeutic plan ? ? ?

## 2022-03-06 ENCOUNTER — Encounter: Payer: Self-pay | Admitting: Internal Medicine

## 2022-03-06 NOTE — Patient Instructions (Signed)
See assessment and plan under each diagnosis in the problem list and acutely for this visit 

## 2022-03-06 NOTE — Assessment & Plan Note (Addendum)
Current A1c is 5.9%, prediabetic on diet alone. No diabetic meds indicated. ?

## 2022-03-06 NOTE — Assessment & Plan Note (Signed)
She is pleasantly demented without behavioral issues.  She confabulates about long dead relatives.  Antiwandering bracelet is in place. ?

## 2022-03-06 NOTE — Assessment & Plan Note (Addendum)
Current creatinine 1.21 with GFR 46 indicating low stage IIIa CKD.  No change indicated in ARB dosage unless there is progression of CKD. ?

## 2022-03-06 NOTE — Assessment & Plan Note (Signed)
TSH was therapeutic at 2.58 on 10/10/2021.  Update in the next 1-2 months is clinically appropriate. ?

## 2022-03-10 ENCOUNTER — Other Ambulatory Visit (HOSPITAL_COMMUNITY): Payer: Medicare PPO

## 2022-03-11 ENCOUNTER — Ambulatory Visit (HOSPITAL_COMMUNITY): Payer: Medicare PPO | Admitting: Hematology

## 2022-03-11 ENCOUNTER — Ambulatory Visit (HOSPITAL_COMMUNITY): Payer: Medicare PPO

## 2022-03-17 ENCOUNTER — Other Ambulatory Visit (HOSPITAL_COMMUNITY): Payer: Medicare PPO

## 2022-03-17 ENCOUNTER — Encounter: Payer: Self-pay | Admitting: Adult Health

## 2022-03-17 ENCOUNTER — Non-Acute Institutional Stay (SKILLED_NURSING_FACILITY): Payer: Medicare PPO | Admitting: Adult Health

## 2022-03-17 ENCOUNTER — Inpatient Hospital Stay (HOSPITAL_COMMUNITY): Payer: Medicare PPO | Attending: Hematology

## 2022-03-17 ENCOUNTER — Other Ambulatory Visit (HOSPITAL_COMMUNITY)
Admission: RE | Admit: 2022-03-17 | Discharge: 2022-03-17 | Disposition: A | Discharge status: Home / Self Care | Payer: Medicare PPO | Source: Skilled Nursing Facility | Attending: Internal Medicine | Admitting: Internal Medicine

## 2022-03-17 DIAGNOSIS — U071 COVID-19: Secondary | ICD-10-CM

## 2022-03-17 DIAGNOSIS — E119 Type 2 diabetes mellitus without complications: Secondary | ICD-10-CM

## 2022-03-17 DIAGNOSIS — C9000 Multiple myeloma not having achieved remission: Secondary | ICD-10-CM | POA: Insufficient documentation

## 2022-03-17 DIAGNOSIS — R197 Diarrhea, unspecified: Secondary | ICD-10-CM | POA: Insufficient documentation

## 2022-03-17 DIAGNOSIS — R509 Fever, unspecified: Secondary | ICD-10-CM | POA: Diagnosis not present

## 2022-03-17 LAB — CBC WITH DIFFERENTIAL/PLATELET
Abs Immature Granulocytes: 0.01 10*3/uL (ref 0.00–0.07)
Basophils Absolute: 0 10*3/uL (ref 0.0–0.1)
Basophils Relative: 1 %
Eosinophils Absolute: 0 10*3/uL (ref 0.0–0.5)
Eosinophils Relative: 1 %
HCT: 34.7 % — ABNORMAL LOW (ref 36.0–46.0)
Hemoglobin: 10.9 g/dL — ABNORMAL LOW (ref 12.0–15.0)
Immature Granulocytes: 0 %
Lymphocytes Relative: 5 %
Lymphs Abs: 0.2 10*3/uL — ABNORMAL LOW (ref 0.7–4.0)
MCH: 30.5 pg (ref 26.0–34.0)
MCHC: 31.4 g/dL (ref 30.0–36.0)
MCV: 97.2 fL (ref 80.0–100.0)
Monocytes Absolute: 0.4 10*3/uL (ref 0.1–1.0)
Monocytes Relative: 11 %
Neutro Abs: 3 10*3/uL (ref 1.7–7.7)
Neutrophils Relative %: 82 %
Platelets: 121 10*3/uL — ABNORMAL LOW (ref 150–400)
RBC: 3.57 MIL/uL — ABNORMAL LOW (ref 3.87–5.11)
RDW: 16.2 % — ABNORMAL HIGH (ref 11.5–15.5)
WBC: 3.6 10*3/uL — ABNORMAL LOW (ref 4.0–10.5)
nRBC: 0 % (ref 0.0–0.2)

## 2022-03-17 LAB — BASIC METABOLIC PANEL
Anion gap: 6 (ref 5–15)
BUN: 22 mg/dL (ref 8–23)
CO2: 22 mmol/L (ref 22–32)
Calcium: 9 mg/dL (ref 8.9–10.3)
Chloride: 114 mmol/L — ABNORMAL HIGH (ref 98–111)
Creatinine, Ser: 1.17 mg/dL — ABNORMAL HIGH (ref 0.44–1.00)
GFR, Estimated: 48 mL/min — ABNORMAL LOW (ref >60)
Glucose, Bld: 96 mg/dL (ref 70–99)
Potassium: 3.5 mmol/L (ref 3.5–5.1)
Sodium: 142 mmol/L (ref 135–145)

## 2022-03-17 LAB — COMPREHENSIVE METABOLIC PANEL
ALT: 40 U/L (ref 0–44)
AST: 69 U/L — ABNORMAL HIGH (ref 15–41)
Albumin: 3.4 g/dL — ABNORMAL LOW (ref 3.5–5.0)
Alkaline Phosphatase: 68 U/L (ref 38–126)
Anion gap: 7 (ref 5–15)
BUN: 20 mg/dL (ref 8–23)
CO2: 26 mmol/L (ref 22–32)
Calcium: 9.2 mg/dL (ref 8.9–10.3)
Chloride: 111 mmol/L (ref 98–111)
Creatinine, Ser: 1.16 mg/dL — ABNORMAL HIGH (ref 0.44–1.00)
GFR, Estimated: 49 mL/min — ABNORMAL LOW (ref >60)
Glucose, Bld: 140 mg/dL — ABNORMAL HIGH (ref 70–99)
Potassium: 3.6 mmol/L (ref 3.5–5.1)
Sodium: 144 mmol/L (ref 135–145)
Total Bilirubin: 0.6 mg/dL (ref 0.3–1.2)
Total Protein: 6.7 g/dL (ref 6.5–8.1)

## 2022-03-17 LAB — CBC
HCT: 32.5 % — ABNORMAL LOW (ref 36.0–46.0)
Hemoglobin: 10.9 g/dL — ABNORMAL LOW (ref 12.0–15.0)
MCH: 32.2 pg (ref 26.0–34.0)
MCHC: 33.5 g/dL (ref 30.0–36.0)
MCV: 96.2 fL (ref 80.0–100.0)
Platelets: 98 10*3/uL — ABNORMAL LOW (ref 150–400)
RBC: 3.38 MIL/uL — ABNORMAL LOW (ref 3.87–5.11)
RDW: 16.4 % — ABNORMAL HIGH (ref 11.5–15.5)
WBC: 4.1 10*3/uL (ref 4.0–10.5)
nRBC: 0 % (ref 0.0–0.2)

## 2022-03-17 LAB — LACTATE DEHYDROGENASE: LDH: 232 U/L — ABNORMAL HIGH (ref 98–192)

## 2022-03-17 LAB — C-REACTIVE PROTEIN: CRP: 1.2 mg/dL — ABNORMAL HIGH (ref <1.0)

## 2022-03-17 LAB — MAGNESIUM: Magnesium: 2.1 mg/dL (ref 1.7–2.4)

## 2022-03-17 LAB — D-DIMER, QUANTITATIVE: D-Dimer, Quant: 1.67 ug/mL-FEU — ABNORMAL HIGH (ref 0.00–0.50)

## 2022-03-17 MED ORDER — OCTREOTIDE ACETATE 30 MG IM KIT
PACK | INTRAMUSCULAR | Status: AC
  Filled 2022-03-17: qty 1

## 2022-03-17 NOTE — Progress Notes (Addendum)
?Location:  [facility] ?Nursing Home Room Number: 148-W ?Place of Service:  SNF (31) ? ? ?CODE STATUS: DNR ? ?Allergies  ?Allergen Reactions  ? Lotensin [Benazepril]   ? ? ?Chief Complaint  ?Patient presents with  ? Acute Visit  ?  COVID positive   ? ? ?HPI: ? ?She has tested positive for covid. There are no reports of fevers, no cough. No shortness of breath, no body aches or pains.  ? ?Past Medical History:  ?Diagnosis Date  ? Benign hypertension   ? Cataract   ? Central nervous system lymphoma [facility])   ? Dementia ([place])   ? Diabetes mellitus with CKD   ? H/O partial nephrectomy   ? Hypokalemia   ? Hypothyroidism   ? Impaired cognition   ? Multiple myeloma ([facility])   ? Dr Maylon Peppers, [facility]  ? Thyroid disease   ? ? ?Past Surgical History:  ?Procedure Laterality Date  ? ABDOMINAL HYSTERECTOMY    ? CRANIOTOMY    ? for lymphoma  ? YAG LASER APPLICATION Right 6/94/8546  ? Procedure: YAG LASER APPLICATION;  Surgeon: [name], MD;  Location: [facility];  Service: Ophthalmology;  Laterality: Right;  ? ? ?Social History  ? ?Socioeconomic History  ? Marital status: Single  ?  Spouse name: Not on file  ? Number of children: Not on file  ? Years of education: Not on file  ? Highest education level: Not on file  ?Occupational History  ? Occupation: retired   ?Tobacco Use  ? Smoking status: Never  ? Smokeless tobacco: Never  ?Vaping Use  ? Vaping Use: Never used  ?Substance and Sexual Activity  ? Alcohol use: No  ? Drug use: No  ? Sexual activity: Never  ?Other Topics Concern  ? Not on file  ?Social History Narrative  ? Long term resident of [facility]   ? ?Social Determinants of Health  ? ?Financial Resource Strain: Not on file  ?Food Insecurity: Not on file  ?Transportation Needs: Not on file  ?Physical Activity: Not on file  ?Stress: Not on file  ?Social Connections: Not on file  ?Intimate Partner Violence: Not on file  ? ?Family History  ?Problem Relation Age of Onset  ? Depression Mother   ? Diabetes Mother   ? Heart disease  Father   ? Cancer - Prostate Brother   ? Cancer Brother   ? Cancer Sister   ? ? ? ? ?VITAL SIGNS ?BP 128/64   Pulse 72   Temp 98 ?F (36.7 ?C)   Resp (!) 21   Ht $R'5\' 9"'eZ$  (1.753 m)   Wt 122 lb (55.3 kg)   BMI 18.02 kg/m?  ? ?Facility-Administered Encounter Medications as of 03/17/2022  ?Medication  ? cyanocobalamin ((VITAMIN B-12)) 1000 MCG/ML injection  ? cyanocobalamin ((VITAMIN B-12)) 1000 MCG/ML injection  ? denosumab (XGEVA) 120 MG/1.7ML injection  ? dexamethasone (DECADRON) 4 MG tablet  ? dexamethasone (DECADRON) 4 MG tablet  ? diphenhydrAMINE (BENADRYL) 50 MG/ML injection  ? diphenhydrAMINE (BENADRYL) 50 MG/ML injection  ? diphenhydrAMINE (BENADRYL) 50 MG/ML injection  ? epoetin alfa-epbx (RETACRIT) 27035 UNIT/ML injection  ? epoetin alfa-epbx (RETACRIT) 00938 UNIT/ML injection  ? epoetin alfa-epbx (RETACRIT) 18299 UNIT/ML injection  ? fulvestrant (FASLODEX) 250 MG/5ML injection  ? magnesium sulfate 2 GM/50ML IVPB  ? octreotide (SANDOSTATIN LAR) 30 MG IM injection  ? palonosetron (ALOXI) 0.25 MG/5ML injection  ? palonosetron (ALOXI) 0.25 MG/5ML injection  ? palonosetron (ALOXI) 0.25 MG/5ML injection  ? palonosetron (ALOXI) 0.25  MG/5ML injection  ? potassium chloride 10 MEQ/100ML IVPB  ? potassium chloride SA (KLOR-CON M) 20 MEQ CR tablet  ? potassium chloride SA (KLOR-CON M) 20 MEQ CR tablet  ? prochlorperazine (COMPAZINE) 10 MG tablet  ? prochlorperazine (COMPAZINE) 10 MG tablet  ? prochlorperazine (COMPAZINE) tablet 10 mg  ? ?Outpatient Encounter Medications as of 03/17/2022  ?Medication Sig  ? acetaminophen (TYLENOL) 500 MG tablet Take 1,000 mg by mouth every 6 (six) hours as needed for mild pain.  ? acyclovir (ZOVIRAX) 400 MG tablet TAKE 1 TABLET BY MOUTH TWICE DAILY  ? aspirin EC 81 MG tablet Take 81 mg by mouth daily.  ? busPIRone (BUSPAR) 10 MG tablet Take by mouth 2 (two) times daily. For anxiety  ? Calcium Carbonate-Vitamin D (CALCIUM-VITAMIN D3 PO) Take 400 Units by mouth daily in the afternoon.  ?  cholestyramine (QUESTRAN) 4 g packet Take 4 g by mouth 2 (two) times daily between meals.  ? Ergocalciferol (VITAMIN D2 PO) 1,250 mcg (50,000 unit); oral ?Special Instructions: Once A Day Every 7 Days x 2 doses.  ? Lactobacillus Probiotic TABS Take 2 tablets by mouth daily.  ? levothyroxine (SYNTHROID, LEVOTHROID) 25 MCG tablet Take 25 mcg by mouth daily before breakfast.  ? lipase/protease/amylase (CREON) 36000 UNITS CPEP capsule 36,000-114,000- 180,000 unit; amt: 2; oral ?Special Instructions: Take 2 capsules with meals ?With Meals  ? lipase/protease/amylase (CREON) 36000 UNITS CPEP capsule 36,000-114,000- 180,000 unit; amt: 1; oral ?Special Instructions: Take one capsule with snacks if eaten between meals. ?Twice A Day Between Meals  ? loperamide (IMODIUM A-D) 2 MG tablet Take 4 mg by mouth 3 (three) times daily. For diarrhea  ? losartan (COZAAR) 50 MG tablet Take 1 tablet (50 mg total) by mouth daily.  ? molnupiravir EUA (LAGEVRIO) 200 MG CAPS capsule Take 4 capsules by mouth 2 (two) times daily.  ? NON FORMULARY Regular diet no dairy products.  ? NON FORMULARY Wanderguard #2237 to ankle for safety awareness. Check placement and function qshift. ?Special Instructions: Check placement and function qshift. ?Every Shift ?Day, Evening, Night  ? potassium chloride SA (KLOR-CON) 20 MEQ tablet Take 20 mEq by mouth daily.   ? Throat Lozenges (ZINC W/A&C) LOZG Special Instructions: 5 Times Per Day x 2 weeks.  ? vitamin C (ASCORBIC ACID) 500 MG tablet Take 500 mg by mouth 2 (two) times daily.  ? ? ? ?SIGNIFICANT DIAGNOSTIC EXAMS ? ? ?PREVIOUS  ? ?08-10-20: t score: -3.758 ? ?NO NEW EXAMS.  ?  ? ?LABS REVIEWED PREVIOUS   ? ?04-08-21: chol 142; ldl 66; trig 101; hdl 56 ?04-09-21: wbc 4.6; hgb 11.3; hct 35.3; mcv 100.6 plt 140; glucose 101; bun 20; creat 1.13; k+ 3.5; na++ 139; ca 8.5 GFR 50; liver normal albumin 3.4  ?06-18-21: wbc 4.5; hgb 10.6; hct 33.7; mcv 98.5 plt 174; glucose 111; bun 22; creat 1.20 ;k+ 3.7; na++ 141; ca  8.9; GFR 47; liver normal albumin 3.4 ?07-15-21: wbc 4.7; hgb 11.7; hct 35.9; mcv 97.0 plt 220; glucose 161; bun 21; creat 1.23; k+ 3.9; na++ 138; ca 8.8; GFR 46 liver normal albumin 3.2 uric acid 4.4 ?08-08-21: urine culture: 70,000 e-coli ?08-26-21: wbc 6.3; hgb 11.1; hct 35.3; mcv 99.2 plt 165; glucose 117; bun 16; creat 1.02; k+ 4.0; na++ 141; ca 9.1 GFR 57; liver normal albumin 3.1  ?09-05-21: hgb a1c 5.9; urine micro-albumin 7.9 ?10-07-21: wbc 5.6; hgb 10.5; hct 32.5; mcv 100.3 plt 163; glucose 105; bun 25; creat 1.14; k+ 3.8; na++ 138; ca  8.8; GFR 50 liver normal albumin 3.2 ?10-10-21: chol 153; ldl 81; trig 56; hdl 61; tsh 2.577 ?11-11-21: wbc 4.6; hgb 10.9; hct 34.5; mcv 98.6 plt 160; glucose 183; bun 25; creat 1.23; k+ 3.8; na++ 143; ca 9.1; GFR 45; liver normal albumin 3.3; mag 2.3  ?12-23-21: wbc 4,1 hgb 11.9; hct 36.8; mcv 98.1 plt 106; glucose 117; bun 20; creat 1.17; k+ 3.4; na++ 142; ca 8.9; GFR 48; liver normal albumin  3.5  mag 2.2  ? ?TODAY ? ?01-06-22: hgb a1c 5.9 ?03-17-22: wbc 3.6; hgb 10.9; hct 34.7; mcv 97.2 plt 121; glucose 140; bun 20; creat 1.16; k+ 3.6; na++ 144; ca 9.2 GFR 49; protein 6.7; albumin 3.4 ast 69 ? ?Review of Systems  ?Constitutional:  Positive for malaise/fatigue.  ?Respiratory:  Positive for cough. Negative for shortness of breath.   ?Cardiovascular:  Negative for chest pain, palpitations and leg swelling.  ?Gastrointestinal:  Negative for abdominal pain, constipation and heartburn.  ?Musculoskeletal:  Negative for back pain, joint pain and myalgias.  ?Skin: Negative.   ?Neurological:  Negative for dizziness.  ?Psychiatric/Behavioral:  The patient is not nervous/anxious.   ? ?Physical Exam ?Constitutional:   ?   General: She is not in acute distress. ?   Appearance: She is well-developed. She is not diaphoretic.  ?Neck:  ?   Thyroid: No thyromegaly.  ?Cardiovascular:  ?   Rate and Rhythm: Normal rate and regular rhythm.  ?   Pulses: Normal pulses.  ?   Heart sounds: Normal heart  sounds.  ?Pulmonary:  ?   Effort: Pulmonary effort is normal. No respiratory distress.  ?   Breath sounds: Normal breath sounds.  ?Abdominal:  ?   General: Bowel sounds are normal. There is no distension.  ?   Pa

## 2022-03-18 ENCOUNTER — Ambulatory Visit (HOSPITAL_COMMUNITY): Payer: Medicare PPO

## 2022-03-18 ENCOUNTER — Non-Acute Institutional Stay (SKILLED_NURSING_FACILITY): Payer: Medicare PPO | Admitting: Adult Health

## 2022-03-18 ENCOUNTER — Ambulatory Visit (HOSPITAL_COMMUNITY): Payer: Medicare PPO | Admitting: Hematology

## 2022-03-18 ENCOUNTER — Encounter: Payer: Self-pay | Admitting: Adult Health

## 2022-03-18 DIAGNOSIS — U071 COVID-19: Secondary | ICD-10-CM | POA: Diagnosis not present

## 2022-03-18 DIAGNOSIS — E119 Type 2 diabetes mellitus without complications: Secondary | ICD-10-CM | POA: Diagnosis not present

## 2022-03-18 DIAGNOSIS — R7989 Other specified abnormal findings of blood chemistry: Secondary | ICD-10-CM

## 2022-03-18 LAB — KAPPA/LAMBDA LIGHT CHAINS
Kappa free light chain: 16.4 mg/L (ref 3.3–19.4)
Kappa, lambda light chain ratio: 0.92 (ref 0.26–1.65)
Lambda free light chains: 17.8 mg/L (ref 5.7–26.3)

## 2022-03-18 NOTE — Progress Notes (Signed)
?Location:  [place] ?Nursing Home Room Number: 127-W ?Place of Service:  SNF (31) ? ? ?CODE STATUS: DNR ? ?Allergies  ?Allergen Reactions  ? Lotensin [Benazepril]   ? ? ?Chief Complaint  ?Patient presents with  ? Follow-up  ?  Follow-up on recent labs   ? ? ?HPI: ? ?She has been diagnosed with covid. She is presently being treated. Her d-dimer is 1.67. there are no reports of shortness of breath. There have been no fevers.  ? ?Past Medical History:  ?Diagnosis Date  ? Benign hypertension   ? Cataract   ? Central nervous system lymphoma [facility])   ? Dementia ([place])   ? Diabetes mellitus with CKD   ? H/O partial nephrectomy   ? Hypokalemia   ? Hypothyroidism   ? Impaired cognition   ? Multiple myeloma ([place])   ? Dr Maylon Peppers, [facility]  ? Thyroid disease   ? ? ?Past Surgical History:  ?Procedure Laterality Date  ? ABDOMINAL HYSTERECTOMY    ? CRANIOTOMY    ? for lymphoma  ? YAG LASER APPLICATION Right 01/06/4824  ? Procedure: YAG LASER APPLICATION;  Surgeon: [name], MD;  Location: [facility] ORS;  Service: Ophthalmology;  Laterality: Right;  ? ? ?Social History  ? ?Socioeconomic History  ? Marital status: Single  ?  Spouse name: Not on file  ? Number of children: Not on file  ? Years of education: Not on file  ? Highest education level: Not on file  ?Occupational History  ? Occupation: retired   ?Tobacco Use  ? Smoking status: Never  ? Smokeless tobacco: Never  ?Vaping Use  ? Vaping Use: Never used  ?Substance and Sexual Activity  ? Alcohol use: No  ? Drug use: No  ? Sexual activity: Never  ?Other Topics Concern  ? Not on file  ?Social History Narrative  ? Long term resident of [facility]   ? ?Social Determinants of Health  ? ?Financial Resource Strain: Not on file  ?Food Insecurity: Not on file  ?Transportation Needs: Not on file  ?Physical Activity: Not on file  ?Stress: Not on file  ?Social Connections: Not on file  ?Intimate Partner Violence: Not on file  ? ?Family History  ?Problem Relation Age of Onset  ? Depression  Mother   ? Diabetes Mother   ? Heart disease Father   ? Cancer - Prostate Brother   ? Cancer Brother   ? Cancer Sister   ? ? ? ? ?VITAL SIGNS ?BP 128/68   Pulse 76   Temp 98.1 ?F (36.7 ?C)   Resp 20   Ht _0  (1.753 m)   Wt 122 lb (55.3 kg)   SpO2 96%   BMI 18.02 kg/m?  ? ?Facility-Administered Encounter Medications as of 03/18/2022  ?Medication  ? cyanocobalamin ((VITAMIN B-12)) 1000 MCG/ML injection  ? cyanocobalamin ((VITAMIN B-12)) 1000 MCG/ML injection  ? denosumab (XGEVA) 120 MG/1.7ML injection  ? dexamethasone (DECADRON) 4 MG tablet  ? dexamethasone (DECADRON) 4 MG tablet  ? diphenhydrAMINE (BENADRYL) 50 MG/ML injection  ? diphenhydrAMINE (BENADRYL) 50 MG/ML injection  ? diphenhydrAMINE (BENADRYL) 50 MG/ML injection  ? epoetin alfa-epbx (RETACRIT) 00370 UNIT/ML injection  ? epoetin alfa-epbx (RETACRIT) 48889 UNIT/ML injection  ? epoetin alfa-epbx (RETACRIT) 16945 UNIT/ML injection  ? fulvestrant (FASLODEX) 250 MG/5ML injection  ? magnesium sulfate 2 GM/50ML IVPB  ? octreotide (SANDOSTATIN LAR) 30 MG IM injection  ? palonosetron (ALOXI) 0.25 MG/5ML injection  ? palonosetron (ALOXI) 0.25 MG/5ML injection  ? palonosetron (  ALOXI) 0.25 MG/5ML injection  ? palonosetron (ALOXI) 0.25 MG/5ML injection  ? potassium chloride 10 MEQ/100ML IVPB  ? potassium chloride SA (KLOR-CON M) 20 MEQ CR tablet  ? potassium chloride SA (KLOR-CON M) 20 MEQ CR tablet  ? prochlorperazine (COMPAZINE) 10 MG tablet  ? prochlorperazine (COMPAZINE) 10 MG tablet  ? prochlorperazine (COMPAZINE) tablet 10 mg  ? ?Outpatient Encounter Medications as of 03/18/2022  ?Medication Sig  ? acetaminophen (TYLENOL) 500 MG tablet Take 1,000 mg by mouth every 6 (six) hours as needed for mild pain.  ? acyclovir (ZOVIRAX) 400 MG tablet TAKE 1 TABLET BY MOUTH TWICE DAILY  ? apixaban (ELIQUIS) 2.5 MG TABS tablet Take 2.5 mg by mouth 2 (two) times daily. 9 am and 9 pm  ? aspirin EC 81 MG tablet Take 81 mg by mouth daily.  ? busPIRone (BUSPAR) 10 MG tablet  Take by mouth 2 (two) times daily. For anxiety  ? Calcium Carbonate-Vit D-Min (CALCIUM 600+D PLUS MINERALS) 600-400 MG-UNIT CHEW Chew 1 tablet by mouth daily.  ? cholestyramine (QUESTRAN) 4 g packet Take 4 g by mouth 2 (two) times daily between meals.  ? Ergocalciferol (VITAMIN D2 PO) 1,250 mcg (50,000 unit); oral ?Special Instructions: Once A Day Every 7 Days x 2 doses.  ? Lactobacillus Probiotic TABS Take 2 tablets by mouth daily.  ? levothyroxine (SYNTHROID, LEVOTHROID) 25 MCG tablet Take 25 mcg by mouth daily before breakfast.  ? lipase/protease/amylase (CREON) 36000 UNITS CPEP capsule 36,000-114,000- 180,000 unit; amt: 2; oral ?Special Instructions: Take 2 capsules with meals ?With Meals  ? lipase/protease/amylase (CREON) 36000 UNITS CPEP capsule 36,000-114,000- 180,000 unit; amt: 1; oral ?Special Instructions: Take one capsule with snacks if eaten between meals. ?Twice A Day Between Meals as needed  ? loperamide (IMODIUM A-D) 2 MG tablet Take 4 mg by mouth 3 (three) times daily. For diarrhea  ? losartan (COZAAR) 50 MG tablet Take 1 tablet (50 mg total) by mouth daily.  ? molnupiravir EUA (LAGEVRIO) 200 MG CAPS capsule Take 4 capsules by mouth 2 (two) times daily.  ? NON FORMULARY Regular diet no dairy products.  ? NON FORMULARY Wanderguard #[id number] to ankle for safety awareness. Check placement and function qshift. ?Special Instructions: Check placement and function qshift. ?Every Shift ?Day, Evening, Night  ? potassium chloride SA (KLOR-CON) 20 MEQ tablet Take 20 mEq by mouth daily.   ? Throat Lozenges (ZINC W/A&C) LOZG Special Instructions: 5 Times Per Day x 2 weeks.  ? vitamin C (ASCORBIC ACID) 500 MG tablet Take 500 mg by mouth 2 (two) times daily.  ? [DISCONTINUED] Calcium Carbonate-Vitamin D (CALCIUM-VITAMIN D3 PO) Take 400 Units by mouth daily in the afternoon.  ? ? ? ?SIGNIFICANT DIAGNOSTIC EXAMS ? ?PREVIOUS  ? ?08-10-20: t score: -3.758 ? ?03-16-22: chest x-ray:  ?No radiographic evidence of acute  cardiopulmonary disease ?Mild cardiomegaly ? Mild osteopenia ?Mild osteoarthritis  ?  ? ?LABS REVIEWED PREVIOUS   ? ?04-08-21: chol 142; ldl 66; trig 101; hdl 56 ?04-09-21: wbc 4.6; hgb 11.3; hct 35.3; mcv 100.6 plt 140; glucose 101; bun 20; creat 1.13; k+ 3.5; na++ 139; ca 8.5 GFR 50; liver normal albumin 3.4  ?06-18-21: wbc 4.5; hgb 10.6; hct 33.7; mcv 98.5 plt 174; glucose 111; bun 22; creat 1.20 ;k+ 3.7; na++ 141; ca 8.9; GFR 47; liver normal albumin 3.4 ?07-15-21: wbc 4.7; hgb 11.7; hct 35.9; mcv 97.0 plt 220; glucose 161; bun 21; creat 1.23; k+ 3.9; na++ 138; ca 8.8; GFR 46 liver normal albumin 3.2 uric  acid 4.4 ?08-08-21: urine culture: 70,000 e-coli ?08-26-21: wbc 6.3; hgb 11.1; hct 35.3; mcv 99.2 plt 165; glucose 117; bun 16; creat 1.02; k+ 4.0; na++ 141; ca 9.1 GFR 57; liver normal albumin 3.1  ?09-05-21: hgb a1c 5.9; urine micro-albumin 7.9 ?10-07-21: wbc 5.6; hgb 10.5; hct 32.5; mcv 100.3 plt 163; glucose 105; bun 25; creat 1.14; k+ 3.8; na++ 138; ca 8.8; GFR 50 liver normal albumin 3.2 ?10-10-21: chol 153; ldl 81; trig 56; hdl 61; tsh 2.577 ?11-11-21: wbc 4.6; hgb 10.9; hct 34.5; mcv 98.6 plt 160; glucose 183; bun 25; creat 1.23; k+ 3.8; na++ 143; ca 9.1; GFR 45; liver normal albumin 3.3; mag 2.3  ?12-23-21: wbc 4,1 hgb 11.9; hct 36.8; mcv 98.1 plt 106; glucose 117; bun 20; creat 1.17; k+ 3.4; na++ 142; ca 8.9; GFR 48; liver normal albumin  3.5  mag 2.2  ? ?TODAY ? ?01-06-22: hgb a1c 5.9 ?03-17-22: wbc 3.6; hgb 10.9; hct 34.7; mcv 97.2 plt 121; glucose 140; bun 20; creat 1.16; k+ 3.6; na++ 144; ca 9.2 GFR 49; protein 6.7; albumin 3.4 ast 69 ?03-17-22: wbc 4.1; hgb 10.9; hct 32.5; mcv 96.2 plt 98; glucose 96; bbun 22; creat 1.17; k+ 3.5; na++ 142; ca 9.0; GFR 48 d-dimer 1.67; CRP 1.2  ? ?Review of Systems  ?Constitutional:  Negative for malaise/fatigue.  ?Respiratory:  Negative for cough and shortness of breath.   ?Cardiovascular:  Negative for chest pain, palpitations and leg swelling.  ?Gastrointestinal:  Negative for  abdominal pain, constipation and heartburn.  ?Musculoskeletal:  Negative for back pain, joint pain and myalgias.  ?Skin: Negative.   ?Neurological:  Negative for dizziness.  ?Psychiatric/Behavioral:  The patient is not

## 2022-03-19 DIAGNOSIS — U071 COVID-19: Secondary | ICD-10-CM | POA: Insufficient documentation

## 2022-03-19 DIAGNOSIS — E119 Type 2 diabetes mellitus without complications: Secondary | ICD-10-CM | POA: Insufficient documentation

## 2022-03-19 LAB — PROTEIN ELECTROPHORESIS, SERUM
A/G Ratio: 1.3 (ref 0.7–1.7)
Albumin ELP: 3.4 g/dL (ref 2.9–4.4)
Alpha-1-Globulin: 0.3 g/dL (ref 0.0–0.4)
Alpha-2-Globulin: 0.7 g/dL (ref 0.4–1.0)
Beta Globulin: 0.7 g/dL (ref 0.7–1.3)
Gamma Globulin: 1 g/dL (ref 0.4–1.8)
Globulin, Total: 2.7 g/dL (ref 2.2–3.9)
M-Spike, %: 0.6 g/dL — ABNORMAL HIGH
Total Protein ELP: 6.1 g/dL (ref 6.0–8.5)

## 2022-03-21 ENCOUNTER — Encounter (HOSPITAL_COMMUNITY)
Admission: RE | Admit: 2022-03-21 | Discharge: 2022-03-21 | Disposition: A | Discharge status: Home / Self Care | Payer: Medicare PPO | Source: Skilled Nursing Facility | Attending: Internal Medicine | Admitting: Internal Medicine

## 2022-03-21 DIAGNOSIS — U071 COVID-19: Secondary | ICD-10-CM | POA: Insufficient documentation

## 2022-03-21 DIAGNOSIS — J989 Respiratory disorder, unspecified: Secondary | ICD-10-CM | POA: Insufficient documentation

## 2022-03-21 DIAGNOSIS — F0390 Unspecified dementia without behavioral disturbance: Secondary | ICD-10-CM | POA: Diagnosis not present

## 2022-03-21 DIAGNOSIS — R262 Difficulty in walking, not elsewhere classified: Secondary | ICD-10-CM | POA: Diagnosis not present

## 2022-03-21 DIAGNOSIS — R7989 Other specified abnormal findings of blood chemistry: Secondary | ICD-10-CM | POA: Insufficient documentation

## 2022-03-21 LAB — CBC WITH DIFFERENTIAL/PLATELET
Abs Immature Granulocytes: 0.01 10*3/uL (ref 0.00–0.07)
Basophils Absolute: 0 10*3/uL (ref 0.0–0.1)
Basophils Relative: 0 %
Eosinophils Absolute: 0 10*3/uL (ref 0.0–0.5)
Eosinophils Relative: 1 %
HCT: 32.9 % — ABNORMAL LOW (ref 36.0–46.0)
Hemoglobin: 10.6 g/dL — ABNORMAL LOW (ref 12.0–15.0)
Immature Granulocytes: 0 %
Lymphocytes Relative: 17 %
Lymphs Abs: 0.7 10*3/uL (ref 0.7–4.0)
MCH: 31.6 pg (ref 26.0–34.0)
MCHC: 32.2 g/dL (ref 30.0–36.0)
MCV: 98.2 fL (ref 80.0–100.0)
Monocytes Absolute: 0.3 10*3/uL (ref 0.1–1.0)
Monocytes Relative: 8 %
Neutro Abs: 3 10*3/uL (ref 1.7–7.7)
Neutrophils Relative %: 74 %
Platelets: 89 10*3/uL — ABNORMAL LOW (ref 150–400)
RBC: 3.35 MIL/uL — ABNORMAL LOW (ref 3.87–5.11)
RDW: 16.5 % — ABNORMAL HIGH (ref 11.5–15.5)
WBC: 4.1 10*3/uL (ref 4.0–10.5)
nRBC: 0 % (ref 0.0–0.2)

## 2022-03-21 LAB — BASIC METABOLIC PANEL
Anion gap: 6 (ref 5–15)
BUN: 35 mg/dL — ABNORMAL HIGH (ref 8–23)
CO2: 24 mmol/L (ref 22–32)
Calcium: 8.5 mg/dL — ABNORMAL LOW (ref 8.9–10.3)
Chloride: 115 mmol/L — ABNORMAL HIGH (ref 98–111)
Creatinine, Ser: 1.47 mg/dL — ABNORMAL HIGH (ref 0.44–1.00)
GFR, Estimated: 37 mL/min — ABNORMAL LOW (ref >60)
Glucose, Bld: 111 mg/dL — ABNORMAL HIGH (ref 70–99)
Potassium: 3.9 mmol/L (ref 3.5–5.1)
Sodium: 145 mmol/L (ref 135–145)

## 2022-03-21 LAB — D-DIMER, QUANTITATIVE: D-Dimer, Quant: 1.17 ug/mL-FEU — ABNORMAL HIGH (ref 0.00–0.50)

## 2022-03-21 LAB — C-REACTIVE PROTEIN: CRP: 8.8 mg/dL — ABNORMAL HIGH (ref <1.0)

## 2022-03-24 ENCOUNTER — Other Ambulatory Visit (HOSPITAL_COMMUNITY)
Admission: RE | Admit: 2022-03-24 | Discharge: 2022-03-24 | Disposition: A | Discharge status: Home / Self Care | Payer: Medicare PPO | Source: Skilled Nursing Facility | Attending: Adult Health | Admitting: Adult Health

## 2022-03-24 ENCOUNTER — Inpatient Hospital Stay (HOSPITAL_COMMUNITY): Payer: Medicare PPO

## 2022-03-24 DIAGNOSIS — U071 COVID-19: Secondary | ICD-10-CM | POA: Insufficient documentation

## 2022-03-24 LAB — D-DIMER, QUANTITATIVE: D-Dimer, Quant: 0.72 ug/mL-FEU — ABNORMAL HIGH (ref 0.00–0.50)

## 2022-03-25 ENCOUNTER — Non-Acute Institutional Stay (SKILLED_NURSING_FACILITY): Payer: Medicare PPO | Admitting: Adult Health

## 2022-03-25 ENCOUNTER — Ambulatory Visit (HOSPITAL_COMMUNITY): Payer: Medicare PPO

## 2022-03-25 ENCOUNTER — Encounter: Payer: Self-pay | Admitting: Adult Health

## 2022-03-25 DIAGNOSIS — U071 COVID-19: Secondary | ICD-10-CM | POA: Diagnosis not present

## 2022-03-25 DIAGNOSIS — D6869 Other thrombophilia: Secondary | ICD-10-CM | POA: Diagnosis not present

## 2022-03-25 NOTE — Progress Notes (Signed)
? ?Location:  [place] ?Nursing Home Room Number: 161 ?Place of Service:  SNF (31) ? ? ?CODE STATUS: dnr  ? ?Allergies  ?Allergen Reactions  ? Lotensin [Benazepril]   ? ? ?Chief Complaint  ?Patient presents with  ? Acute Visit  ?  Follow up lab work   ? ? ?HPI: ? ?She is on eliquis due to elevated d-dimer. She is not displaying any symptoms of abnormal clotting. She denies any leg swelling; no chest pain or shortness of breath.  ? ?Past Medical History:  ?Diagnosis Date  ? Benign hypertension   ? Cataract   ? 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 Dementia ([place])   ? Diabetes mellitus with CKD   ? H/O partial nephrectomy   ? Hypokalemia   ? Hypothyroidism   ? Impaired cognition   ? Multiple myeloma ([place])   ? Dr Maylon Peppers, [facility]  ? Thyroid disease   ? ? ?Past Surgical History:  ?Procedure Laterality Date  ? ABDOMINAL HYSTERECTOMY    ? CRANIOTOMY    ? for lymphoma  ? YAG LASER APPLICATION Right 0/96/0454  ? Procedure: YAG LASER APPLICATION;  Surgeon: [name], MD;  Location: [facility] ORS;  Service: Ophthalmology;  Laterality: Right;  ? ? ?Social History  ? ?Socioeconomic History  ? Marital status: Single  ?  Spouse name: Not on file  ? Number of children: Not on file  ? Years of education: Not on file  ? Highest education level: Not on file  ?Occupational History  ? Occupation: retired   ?Tobacco Use  ? Smoking status: Never  ? Smokeless tobacco: Never  ?Vaping Use  ? Vaping Use: Never used  ?Substance and Sexual Activity  ? Alcohol use: No  ? Drug use: No  ? Sexual activity: Never  ?Other Topics Concern  ? Not on file  ?Social History Narrative  ? [facility] resident of [facility]   ? ?Social Determinants of Health  ? ?Financial Resource Strain: Not on file  ?Food Insecurity: Not on file  ?Transportation Needs: Not on file  ?Physical Activity: Not on file  ?Stress: Not on file  ?Social Connections: Not on file  ?Intimate Partner Violence: Not on file  ? ?Family History  ?Problem Relation Age of Onset  ?  Depression Mother   ? Diabetes Mother   ? Heart disease Father   ? Cancer - Prostate Brother   ? Cancer Brother   ? Cancer Sister   ? ? ? ? ?VITAL SIGNS ?BP 131/88   Pulse 78   Temp (!) 97.4 ?F (36.3 ?C)   Ht '5\' 9"'  (1.753 m)   Wt 122 lb (55.3 kg)   BMI 18.02 kg/m?  ? ?Facility-Administered Encounter Medications as of 03/25/2022  ?Medication  ? cyanocobalamin ((VITAMIN B-12)) 1000 MCG/ML injection  ? cyanocobalamin ((VITAMIN B-12)) 1000 MCG/ML injection  ? denosumab (XGEVA) 120 MG/1.7ML injection  ? dexamethasone (DECADRON) 4 MG tablet  ? dexamethasone (DECADRON) 4 MG tablet  ? diphenhydrAMINE (BENADRYL) 50 MG/ML injection  ? diphenhydrAMINE (BENADRYL) 50 MG/ML injection  ? diphenhydrAMINE (BENADRYL) 50 MG/ML injection  ? epoetin alfa-epbx (RETACRIT) 09811 UNIT/ML injection  ? epoetin alfa-epbx (RETACRIT) 91478 UNIT/ML injection  ? epoetin alfa-epbx (RETACRIT) 29562 UNIT/ML injection  ? fulvestrant (FASLODEX) 250 MG/5ML injection  ? magnesium sulfate 2 GM/50ML IVPB  ? octreotide (SANDOSTATIN LAR) 30 MG IM injection  ? palonosetron (ALOXI) 0.25 MG/5ML injection  ? palonosetron (ALOXI) 0.25 MG/5ML injection  ? palonosetron (ALOXI) 0.25 MG/5ML  injection  ? palonosetron (ALOXI) 0.25 MG/5ML injection  ? potassium chloride 10 MEQ/100ML IVPB  ? potassium chloride SA (KLOR-CON M) 20 MEQ CR tablet  ? potassium chloride SA (KLOR-CON M) 20 MEQ CR tablet  ? prochlorperazine (COMPAZINE) 10 MG tablet  ? prochlorperazine (COMPAZINE) 10 MG tablet  ? prochlorperazine (COMPAZINE) tablet 10 mg  ? ?Outpatient Encounter Medications as of 03/25/2022  ?Medication Sig  ? acetaminophen (TYLENOL) 500 MG tablet Take 1,000 mg by mouth every 6 (six) hours as needed for mild pain.  ? acyclovir (ZOVIRAX) 400 MG tablet TAKE 1 TABLET BY MOUTH TWICE DAILY  ? apixaban (ELIQUIS) 2.5 MG TABS tablet Take 2.5 mg by mouth 2 (two) times daily. 9 am and 9 pm  ? aspirin EC 81 MG tablet Take 81 mg by mouth daily.  ? busPIRone (BUSPAR) 10 MG tablet Take by  mouth 2 (two) times daily. For anxiety  ? Calcium Carbonate-Vit D-Min (CALCIUM 600+D PLUS MINERALS) 600-400 MG-UNIT CHEW Chew 1 tablet by mouth daily.  ? cholestyramine (QUESTRAN) 4 g packet Take 4 g by mouth 2 (two) times daily between meals.  ? Ergocalciferol (VITAMIN D2 PO) 1,250 mcg (50,000 unit); oral ?Special Instructions: Once A Day Every 7 Days x 2 doses.  ? Lactobacillus Probiotic TABS Take 2 tablets by mouth daily.  ? levothyroxine (SYNTHROID, LEVOTHROID) 25 MCG tablet Take 25 mcg by mouth daily before breakfast.  ? lipase/protease/amylase (CREON) 36000 UNITS CPEP capsule 36,000-114,000- 180,000 unit; amt: 2; oral ?Special Instructions: Take 2 capsules with meals ?With Meals  ? lipase/protease/amylase (CREON) 36000 UNITS CPEP capsule 36,000-114,000- 180,000 unit; amt: 1; oral ?Special Instructions: Take one capsule with snacks if eaten between meals. ?Twice A Day Between Meals as needed  ? loperamide (IMODIUM A-D) 2 MG tablet Take 4 mg by mouth 3 (three) times daily. For diarrhea  ? losartan (COZAAR) 50 MG tablet Take 1 tablet (50 mg total) by mouth daily.  ? NON FORMULARY Regular diet no dairy products.  ? NON FORMULARY Wanderguard #[id number] to ankle for safety awareness. Check placement and function qshift. ?Special Instructions: Check placement and function qshift. ?Every Shift ?Day, Evening, Night  ? potassium chloride SA (KLOR-CON) 20 MEQ tablet Take 20 mEq by mouth daily.   ? Throat Lozenges (ZINC W/A&C) LOZG Special Instructions: 5 Times Per Day x 2 weeks.  ? vitamin C (ASCORBIC ACID) 500 MG tablet Take 500 mg by mouth 2 (two) times daily.  ? ? ? ?SIGNIFICANT DIAGNOSTIC EXAMS ? ?PREVIOUS  ? ?08-10-20: t score: -3.758 ? ?03-16-22: chest x-ray:  ?No radiographic evidence of acute cardiopulmonary disease ?Mild cardiomegaly ? Mild osteopenia ?Mild osteoarthritis  ? ?NO NEW EXAMS  ?  ? ?LABS REVIEWED PREVIOUS   ? ?04-08-21: chol 142; ldl 66; trig 101; hdl 56 ?04-09-21: wbc 4.6; hgb 11.3; hct 35.3; mcv 100.6 plt  140; glucose 101; bun 20; creat 1.13; k+ 3.5; na++ 139; ca 8.5 GFR 50; liver normal albumin 3.4  ?06-18-21: wbc 4.5; hgb 10.6; hct 33.7; mcv 98.5 plt 174; glucose 111; bun 22; creat 1.20 ;k+ 3.7; na++ 141; ca 8.9; GFR 47; liver normal albumin 3.4 ?07-15-21: wbc 4.7; hgb 11.7; hct 35.9; mcv 97.0 plt 220; glucose 161; bun 21; creat 1.23; k+ 3.9; na++ 138; ca 8.8; GFR 46 liver normal albumin 3.2 uric acid 4.4 ?08-08-21: urine culture: 70,000 e-coli ?08-26-21: wbc 6.3; hgb 11.1; hct 35.3; mcv 99.2 plt 165; glucose 117; bun 16; creat 1.02; k+ 4.0; na++ 141; ca 9.1 GFR 57; liver normal  albumin 3.1  ?09-05-21: hgb a1c 5.9; urine micro-albumin 7.9 ?10-07-21: wbc 5.6; hgb 10.5; hct 32.5; mcv 100.3 plt 163; glucose 105; bun 25; creat 1.14; k+ 3.8; na++ 138; ca 8.8; GFR 50 liver normal albumin 3.2 ?10-10-21: chol 153; ldl 81; trig 56; hdl 61; tsh 2.577 ?11-11-21: wbc 4.6; hgb 10.9; hct 34.5; mcv 98.6 plt 160; glucose 183; bun 25; creat 1.23; k+ 3.8; na++ 143; ca 9.1; GFR 45; liver normal albumin 3.3; mag 2.3  ?12-23-21: wbc 4,1 hgb 11.9; hct 36.8; mcv 98.1 plt 106; glucose 117; bun 20; creat 1.17; k+ 3.4; na++ 142; ca 8.9; GFR 48; liver normal albumin  3.5  mag 2.2  ? ?TODAY ? ?01-06-22: hgb a1c 5.9 ?03-17-22: wbc 3.6; hgb 10.9; hct 34.7; mcv 97.2 plt 121; glucose 140; bun 20; creat 1.16; k+ 3.6; na++ 144; ca 9.2 GFR 49; protein 6.7; albumin 3.4 ast 69 ?03-17-22: wbc 4.1; hgb 10.9; hct 32.5; mcv 96.2 plt 98; glucose 96; bbun 22; creat 1.17; k+ 3.5; na++ 142; ca 9.0; GFR 48 d-dimer 1.67; CRP 1.2  ?03-24-22: d-dimer 0.72 ? ? ?Review of Systems  ?Constitutional:  Negative for malaise/fatigue.  ?Respiratory:  Negative for cough and shortness of breath.   ?Cardiovascular:  Negative for chest pain, palpitations and leg swelling.  ?Gastrointestinal:  Negative for abdominal pain, constipation and heartburn.  ?Musculoskeletal:  Negative for back pain, joint pain and myalgias.  ?Skin: Negative.   ?Neurological:  Negative for dizziness.   ?Psychiatric/Behavioral:  The patient is not nervous/anxious.   ? ?Physical Exam ?Constitutional:   ?   General: She is not in acute distress. ?   Appearance: She is well-developed. She is not diaphoretic.  ?Neck:  ?   Thyroid: N

## 2022-03-31 ENCOUNTER — Inpatient Hospital Stay (HOSPITAL_COMMUNITY): Payer: Medicare PPO

## 2022-03-31 DIAGNOSIS — D6869 Other thrombophilia: Secondary | ICD-10-CM | POA: Insufficient documentation

## 2022-03-31 DIAGNOSIS — U071 COVID-19: Secondary | ICD-10-CM | POA: Insufficient documentation

## 2022-04-01 ENCOUNTER — Ambulatory Visit (HOSPITAL_COMMUNITY): Payer: Medicare PPO

## 2022-04-02 ENCOUNTER — Inpatient Hospital Stay (HOSPITAL_COMMUNITY): Payer: Medicare PPO

## 2022-04-03 ENCOUNTER — Ambulatory Visit (HOSPITAL_COMMUNITY): Payer: Medicare PPO

## 2022-04-03 ENCOUNTER — Ambulatory Visit (HOSPITAL_COMMUNITY): Payer: Medicare PPO | Admitting: Hematology

## 2022-04-04 ENCOUNTER — Non-Acute Institutional Stay (SKILLED_NURSING_FACILITY): Payer: Medicare PPO | Admitting: Adult Health

## 2022-04-04 ENCOUNTER — Encounter: Payer: Self-pay | Admitting: Adult Health

## 2022-04-04 DIAGNOSIS — I700 Atherosclerosis of aorta: Secondary | ICD-10-CM | POA: Diagnosis not present

## 2022-04-04 DIAGNOSIS — C9000 Multiple myeloma not having achieved remission: Secondary | ICD-10-CM

## 2022-04-04 DIAGNOSIS — D696 Thrombocytopenia, unspecified: Secondary | ICD-10-CM | POA: Diagnosis not present

## 2022-04-04 DIAGNOSIS — E1159 Type 2 diabetes mellitus with other circulatory complications: Secondary | ICD-10-CM | POA: Diagnosis not present

## 2022-04-04 DIAGNOSIS — I152 Hypertension secondary to endocrine disorders: Secondary | ICD-10-CM | POA: Diagnosis not present

## 2022-04-04 NOTE — Progress Notes (Signed)
?Location:  [place] ?Nursing Home Room Number: 127-W ?Place of Service:  SNF (31) ? ? ?CODE STATUS: DNR ? ?Allergies  ?Allergen Reactions  ? Lotensin [Benazepril]   ? ? ?Chief Complaint  ?Patient presents with  ? Acute Visit  ?  Care plan meeting  ? ? ?HPI: ? ? ? ?Past Medical History:  ?Diagnosis Date  ? Benign hypertension   ? Cataract   ? 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 Dementia ([place])   ? Diabetes mellitus with CKD   ? H/O partial nephrectomy   ? Hypokalemia   ? Hypothyroidism   ? Impaired cognition   ? Multiple myeloma ([place])   ? Dr Maylon Peppers, [facility]  ? Thyroid disease   ? ? ?Past Surgical History:  ?Procedure Laterality Date  ? ABDOMINAL HYSTERECTOMY    ? CRANIOTOMY    ? for lymphoma  ? YAG LASER APPLICATION Right 9/97/7414  ? Procedure: YAG LASER APPLICATION;  Surgeon: [name], MD;  Location: [facility];  Service: Ophthalmology;  Laterality: Right;  ? ? ?Social History  ? ?Socioeconomic History  ? Marital status: Single  ?  Spouse name: Not on file  ? Number of children: Not on file  ? Years of education: Not on file  ? Highest education level: Not on file  ?Occupational History  ? Occupation: retired   ?Tobacco Use  ? Smoking status: Never  ? Smokeless tobacco: Never  ?Vaping Use  ? Vaping Use: Never used  ?Substance and Sexual Activity  ? Alcohol use: No  ? Drug use: No  ? Sexual activity: Never  ?Other Topics Concern  ? Not on file  ?Social History Narrative  ? Long term resident of [facility]   ? ?Social Determinants of Health  ? ?Financial Resource Strain: Not on file  ?Food Insecurity: Not on file  ?Transportation Needs: Not on file  ?Physical Activity: Not on file  ?Stress: Not on file  ?Social Connections: Not on file  ?Intimate Partner Violence: Not on file  ? ?Family History  ?Problem Relation Age of Onset  ? Depression Mother   ? Diabetes Mother   ? Heart disease Father   ? Cancer - Prostate Brother   ? Cancer Brother   ? Cancer Sister   ? ? ? ? ?VITAL SIGNS ?BP 135/62   Pulse 62    Temp (!) 97.5 ?F (36.4 ?C)   Resp 18   Ht _0  (1.753 m)   Wt 120 lb 1.6 oz (54.5 kg)   SpO2 98%   BMI 17.74 kg/m?  ? ?Facility-Administered Encounter Medications as of 04/04/2022  ?Medication  ? cyanocobalamin ((VITAMIN B-12)) 1000 MCG/ML injection  ? cyanocobalamin ((VITAMIN B-12)) 1000 MCG/ML injection  ? denosumab (XGEVA) 120 MG/1.7ML injection  ? dexamethasone (DECADRON) 4 MG tablet  ? dexamethasone (DECADRON) 4 MG tablet  ? diphenhydrAMINE (BENADRYL) 50 MG/ML injection  ? diphenhydrAMINE (BENADRYL) 50 MG/ML injection  ? diphenhydrAMINE (BENADRYL) 50 MG/ML injection  ? epoetin alfa-epbx (RETACRIT) 23953 UNIT/ML injection  ? epoetin alfa-epbx (RETACRIT) 20233 UNIT/ML injection  ? epoetin alfa-epbx (RETACRIT) 43568 UNIT/ML injection  ? fulvestrant (FASLODEX) 250 MG/5ML injection  ? magnesium sulfate 2 GM/50ML IVPB  ? octreotide (SANDOSTATIN LAR) 30 MG IM injection  ? palonosetron (ALOXI) 0.25 MG/5ML injection  ? palonosetron (ALOXI) 0.25 MG/5ML injection  ? palonosetron (ALOXI) 0.25 MG/5ML injection  ? palonosetron (ALOXI) 0.25 MG/5ML injection  ? potassium chloride 10 MEQ/100ML IVPB  ? potassium chloride SA (KLOR-CON M) 20  MEQ CR tablet  ? potassium chloride SA (KLOR-CON M) 20 MEQ CR tablet  ? prochlorperazine (COMPAZINE) 10 MG tablet  ? prochlorperazine (COMPAZINE) 10 MG tablet  ? prochlorperazine (COMPAZINE) tablet 10 mg  ? ?Outpatient Encounter Medications as of 04/04/2022  ?Medication Sig  ? acetaminophen (TYLENOL) 500 MG tablet Take 1,000 mg by mouth every 6 (six) hours as needed for mild pain.  ? acyclovir (ZOVIRAX) 400 MG tablet TAKE 1 TABLET BY MOUTH TWICE DAILY  ? aspirin EC 81 MG tablet Take 81 mg by mouth daily.  ? busPIRone (BUSPAR) 10 MG tablet Take by mouth 2 (two) times daily. For anxiety  ? Calcium Carbonate-Vit D-Min (CALCIUM 600+D PLUS MINERALS) 600-400 MG-UNIT CHEW Chew 1 tablet by mouth daily.  ? cholestyramine (QUESTRAN) 4 g packet Take 4 g by mouth 2 (two) times daily between meals.  ?  Lactobacillus Probiotic TABS Take 2 tablets by mouth daily.  ? levothyroxine (SYNTHROID, LEVOTHROID) 25 MCG tablet Take 25 mcg by mouth daily before breakfast.  ? lipase/protease/amylase (CREON) 36000 UNITS CPEP capsule 36,000-114,000- 180,000 unit; amt: 2; oral ?Special Instructions: Take 2 capsules with meals ?With Meals  ? lipase/protease/amylase (CREON) 36000 UNITS CPEP capsule 36,000-114,000- 180,000 unit; amt: 1; oral ?Special Instructions: Take one capsule with snacks if eaten between meals. ?Twice A Day Between Meals as needed  ? loperamide (IMODIUM A-D) 2 MG tablet Take 2 mg by mouth 3 (three) times daily. For diarrhea  ? losartan (COZAAR) 50 MG tablet Take 1 tablet (50 mg total) by mouth daily.  ? NON FORMULARY Regular diet no dairy products.  ? NON FORMULARY Wanderguard #[id number] to ankle for safety awareness. Check placement and function qshift. ?Special Instructions: Check placement and function qshift. ?Every Shift ?Day, Evening, Night  ? potassium chloride SA (KLOR-CON) 20 MEQ tablet Take 20 mEq by mouth daily.   ? sertraline (ZOLOFT) 25 MG tablet Take 25 mg by mouth daily.  ? [DISCONTINUED] apixaban (ELIQUIS) 2.5 MG TABS tablet Take 2.5 mg by mouth 2 (two) times daily. 9 am and 9 pm  ? [DISCONTINUED] Ergocalciferol (VITAMIN D2 PO) 1,250 mcg (50,000 unit); oral ?Special Instructions: Once A Day Every 7 Days x 2 doses.  ? [DISCONTINUED] Throat Lozenges (ZINC W/A&C) LOZG Special Instructions: 5 Times Per Day x 2 weeks.  ? ? ? ?SIGNIFICANT DIAGNOSTIC EXAMS ? ? ? ? ? ? ?ASSESSMENT/ PLAN: ? ? ? ? ?Ok Edwards NP ?[facility]  ?Contact [phone] Monday through Friday 8am- 5pm  ?After hours call [phone]  ? ?

## 2022-04-04 NOTE — Progress Notes (Signed)
? ?Location:  [place] ?Nursing Home Room Number: 127-W ?Place of Service:  SNF (31) ? ? ?CODE STATUS: dnr  ? ?Allergies  ?Allergen Reactions  ? Lotensin [Benazepril]   ? ? ?Chief Complaint  ?Patient presents with  ? Acute Visit  ?  Care plan meeting  ? ? ?HPI: ? ?We have come together for her care plan meeting. Family present  BIMS 5/15 mood 3/30: tired. She requires limited to extensive assist with adls. She is frequently incontinent of bladder and bowel. She uses wheelchair; does ambulate behind wheelchair has had no falls. Dietary: good appetite; regular diet weight is 122 pounds which is stable. Therapy none at this time. She continues to be followed for her chronic illnesses including: Aortic atherosclerosis  Hypertension associated with type 2 diabetes mellitus  Thrombocytopenia Multiple myeloma not achieving remission ? ?Past Medical History:  ?Diagnosis Date  ? Benign hypertension   ? Cataract   ? 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 Dementia ([place])   ? Diabetes mellitus with CKD   ? H/O partial nephrectomy   ? Hypokalemia   ? Hypothyroidism   ? Impaired cognition   ? Multiple myeloma ([place])   ? Dr Maylon Peppers, [facility]  ? Thyroid disease   ? ? ?Past Surgical History:  ?Procedure Laterality Date  ? ABDOMINAL HYSTERECTOMY    ? CRANIOTOMY    ? for lymphoma  ? YAG LASER APPLICATION Right 4/80/1655  ? Procedure: YAG LASER APPLICATION;  Surgeon: [name], MD;  Location: [facility];  Service: Ophthalmology;  Laterality: Right;  ? ? ?Social History  ? ?Socioeconomic History  ? Marital status: Single  ?  Spouse name: Not on file  ? Number of children: Not on file  ? Years of education: Not on file  ? Highest education level: Not on file  ?Occupational History  ? Occupation: retired   ?Tobacco Use  ? Smoking status: Never  ? Smokeless tobacco: Never  ?Vaping Use  ? Vaping Use: Never used  ?Substance and Sexual Activity  ? Alcohol use: No  ? Drug use: No  ? Sexual activity: Never  ?Other Topics Concern  ?  Not on file  ?Social History Narrative  ? Long term resident of [facility]   ? ?Social Determinants of Health  ? ?Financial Resource Strain: Not on file  ?Food Insecurity: Not on file  ?Transportation Needs: Not on file  ?Physical Activity: Not on file  ?Stress: Not on file  ?Social Connections: Not on file  ?Intimate Partner Violence: Not on file  ? ?Family History  ?Problem Relation Age of Onset  ? Depression Mother   ? Diabetes Mother   ? Heart disease Father   ? Cancer - Prostate Brother   ? Cancer Brother   ? Cancer Sister   ? ? ? ? ?VITAL SIGNS ?BP 135/62   Pulse 62   Temp (!) 97.5 ?F (36.4 ?C)   Resp 18   Ht '5\' 9"'  (1.753 m)   Wt 120 lb 1.6 oz (54.5 kg)   SpO2 98%   BMI 17.74 kg/m?  ? ?Facility-Administered Encounter Medications as of 04/04/2022  ?Medication  ? cyanocobalamin ((VITAMIN B-12)) 1000 MCG/ML injection  ? cyanocobalamin ((VITAMIN B-12)) 1000 MCG/ML injection  ? denosumab (XGEVA) 120 MG/1.7ML injection  ? dexamethasone (DECADRON) 4 MG tablet  ? dexamethasone (DECADRON) 4 MG tablet  ? diphenhydrAMINE (BENADRYL) 50 MG/ML injection  ? diphenhydrAMINE (BENADRYL) 50 MG/ML injection  ? diphenhydrAMINE (BENADRYL) 50 MG/ML injection  ?  epoetin alfa-epbx (RETACRIT) 41740 UNIT/ML injection  ? epoetin alfa-epbx (RETACRIT) 81448 UNIT/ML injection  ? epoetin alfa-epbx (RETACRIT) 18563 UNIT/ML injection  ? fulvestrant (FASLODEX) 250 MG/5ML injection  ? magnesium sulfate 2 GM/50ML IVPB  ? octreotide (SANDOSTATIN LAR) 30 MG IM injection  ? palonosetron (ALOXI) 0.25 MG/5ML injection  ? palonosetron (ALOXI) 0.25 MG/5ML injection  ? palonosetron (ALOXI) 0.25 MG/5ML injection  ? palonosetron (ALOXI) 0.25 MG/5ML injection  ? potassium chloride 10 MEQ/100ML IVPB  ? potassium chloride SA (KLOR-CON M) 20 MEQ CR tablet  ? potassium chloride SA (KLOR-CON M) 20 MEQ CR tablet  ? prochlorperazine (COMPAZINE) 10 MG tablet  ? prochlorperazine (COMPAZINE) 10 MG tablet  ? prochlorperazine (COMPAZINE) tablet 10 mg  ? ?Outpatient  Encounter Medications as of 04/04/2022  ?Medication Sig  ? acetaminophen (TYLENOL) 500 MG tablet Take 1,000 mg by mouth every 6 (six) hours as needed for mild pain.  ? acyclovir (ZOVIRAX) 400 MG tablet TAKE 1 TABLET BY MOUTH TWICE DAILY  ? apixaban (ELIQUIS) 2.5 MG TABS tablet Take 2.5 mg by mouth 2 (two) times daily. 9 am and 9 pm  ? aspirin EC 81 MG tablet Take 81 mg by mouth daily.  ? busPIRone (BUSPAR) 10 MG tablet Take by mouth 2 (two) times daily. For anxiety  ? Calcium Carbonate-Vit D-Min (CALCIUM 600+D PLUS MINERALS) 600-400 MG-UNIT CHEW Chew 1 tablet by mouth daily.  ? cholestyramine (QUESTRAN) 4 g packet Take 4 g by mouth 2 (two) times daily between meals.  ? Ergocalciferol (VITAMIN D2 PO) 1,250 mcg (50,000 unit); oral ?Special Instructions: Once A Day Every 7 Days x 2 doses.  ? Lactobacillus Probiotic TABS Take 2 tablets by mouth daily.  ? levothyroxine (SYNTHROID, LEVOTHROID) 25 MCG tablet Take 25 mcg by mouth daily before breakfast.  ? lipase/protease/amylase (CREON) 36000 UNITS CPEP capsule 36,000-114,000- 180,000 unit; amt: 2; oral ?Special Instructions: Take 2 capsules with meals ?With Meals  ? lipase/protease/amylase (CREON) 36000 UNITS CPEP capsule 36,000-114,000- 180,000 unit; amt: 1; oral ?Special Instructions: Take one capsule with snacks if eaten between meals. ?Twice A Day Between Meals as needed  ? loperamide (IMODIUM A-D) 2 MG tablet Take 4 mg by mouth 3 (three) times daily. For diarrhea  ? losartan (COZAAR) 50 MG tablet Take 1 tablet (50 mg total) by mouth daily.  ? NON FORMULARY Regular diet no dairy products.  ? NON FORMULARY Wanderguard #2237 to ankle for safety awareness. Check placement and function qshift. ?Special Instructions: Check placement and function qshift. ?Every Shift ?Day, Evening, Night  ? potassium chloride SA (KLOR-CON) 20 MEQ tablet Take 20 mEq by mouth daily.   ? Throat Lozenges (ZINC W/A&C) LOZG Special Instructions: 5 Times Per Day x 2 weeks.  ? ? ? ?SIGNIFICANT  DIAGNOSTIC EXAMS ? ?PREVIOUS  ? ?08-10-20: t score: -3.758 ? ?03-16-22: chest x-ray:  ?No radiographic evidence of acute cardiopulmonary disease ?Mild cardiomegaly ? Mild osteopenia ?Mild osteoarthritis  ? ?NO NEW EXAMS  ?  ? ?LABS REVIEWED PREVIOUS   ? ?04-08-21: chol 142; ldl 66; trig 101; hdl 56 ?04-09-21: wbc 4.6; hgb 11.3; hct 35.3; mcv 100.6 plt 140; glucose 101; bun 20; creat 1.13; k+ 3.5; na++ 139; ca 8.5 GFR 50; liver normal albumin 3.4  ?06-18-21: wbc 4.5; hgb 10.6; hct 33.7; mcv 98.5 plt 174; glucose 111; bun 22; creat 1.20 ;k+ 3.7; na++ 141; ca 8.9; GFR 47; liver normal albumin 3.4 ?07-15-21: wbc 4.7; hgb 11.7; hct 35.9; mcv 97.0 plt 220; glucose 161; bun 21; creat 1.23;  k+ 3.9; na++ 138; ca 8.8; GFR 46 liver normal albumin 3.2 uric acid 4.4 ?08-08-21: urine culture: 70,000 e-coli ?08-26-21: wbc 6.3; hgb 11.1; hct 35.3; mcv 99.2 plt 165; glucose 117; bun 16; creat 1.02; k+ 4.0; na++ 141; ca 9.1 GFR 57; liver normal albumin 3.1  ?09-05-21: hgb a1c 5.9; urine micro-albumin 7.9 ?10-07-21: wbc 5.6; hgb 10.5; hct 32.5; mcv 100.3 plt 163; glucose 105; bun 25; creat 1.14; k+ 3.8; na++ 138; ca 8.8; GFR 50 liver normal albumin 3.2 ?10-10-21: chol 153; ldl 81; trig 56; hdl 61; tsh 2.577 ?11-11-21: wbc 4.6; hgb 10.9; hct 34.5; mcv 98.6 plt 160; glucose 183; bun 25; creat 1.23; k+ 3.8; na++ 143; ca 9.1; GFR 45; liver normal albumin 3.3; mag 2.3  ?12-23-21: wbc 4,1 hgb 11.9; hct 36.8; mcv 98.1 plt 106; glucose 117; bun 20; creat 1.17; k+ 3.4; na++ 142; ca 8.9; GFR 48; liver normal albumin  3.5  mag 2.2  ?01-06-22: hgb a1c 5.9 ?03-17-22: wbc 3.6; hgb 10.9; hct 34.7; mcv 97.2 plt 121; glucose 140; bun 20; creat 1.16; k+ 3.6; na++ 144; ca 9.2 GFR 49; protein 6.7; albumin 3.4 ast 69 ?03-17-22: wbc 4.1; hgb 10.9; hct 32.5; mcv 96.2 plt 98; glucose 96; bbun 22; creat 1.17; k+ 3.5; na++ 142; ca 9.0; GFR 48 d-dimer 1.67; CRP 1.2  ?03-24-22: d-dimer 0.72 ? ?NO NEW LABS.  ? ?Review of Systems  ?Constitutional:  Negative for malaise/fatigue.   ?Respiratory:  Negative for cough and shortness of breath.   ?Cardiovascular:  Negative for chest pain, palpitations and leg swelling.  ?Gastrointestinal:  Negative for abdominal pain, constipation and heartburn.  ?[place]

## 2022-04-09 ENCOUNTER — Inpatient Hospital Stay (HOSPITAL_COMMUNITY): Payer: Medicare PPO

## 2022-04-10 ENCOUNTER — Encounter: Payer: Self-pay | Admitting: Adult Health

## 2022-04-10 ENCOUNTER — Inpatient Hospital Stay (HOSPITAL_COMMUNITY): Payer: Medicare PPO

## 2022-04-10 ENCOUNTER — Non-Acute Institutional Stay (SKILLED_NURSING_FACILITY): Payer: Medicare PPO | Admitting: Adult Health

## 2022-04-10 DIAGNOSIS — E876 Hypokalemia: Secondary | ICD-10-CM | POA: Diagnosis not present

## 2022-04-10 DIAGNOSIS — N1832 Chronic kidney disease, stage 3b: Secondary | ICD-10-CM | POA: Diagnosis not present

## 2022-04-10 DIAGNOSIS — R627 Adult failure to thrive: Secondary | ICD-10-CM | POA: Diagnosis not present

## 2022-04-10 DIAGNOSIS — E1122 Type 2 diabetes mellitus with diabetic chronic kidney disease: Secondary | ICD-10-CM

## 2022-04-10 DIAGNOSIS — I129 Hypertensive chronic kidney disease with stage 1 through stage 4 chronic kidney disease, or unspecified chronic kidney disease: Secondary | ICD-10-CM

## 2022-04-10 DIAGNOSIS — E039 Hypothyroidism, unspecified: Secondary | ICD-10-CM

## 2022-04-10 NOTE — Progress Notes (Signed)
?Location:  [place] ?Nursing Home Room Number: 148-W ?Place of Service:  SNF (31) ? ? ?CODE STATUS: DNR ? ?Allergies  ?Allergen Reactions  ? Lotensin [Benazepril]   ? ? ?Chief Complaint  ?Patient presents with  ? Medical Management of Chronic Issues ? ?                Failure to thrive in adult:  Hypokalemia:  Hypothyroidism, unspecified type:  Hypertension associated with stage 3b chronic kidney disease due to type 2 diabetes mellitus:  ? ? ?HPI: ? ?She is a 76 year old long term resident of this facility being seen for the management of her chronic illnesses:  Failure to thrive in adult:  Hypokalemia:  Hypothyroidism, unspecified type:  Hypertension associated with stage 3b chronic kidney disease due to type 2 diabetes mellitus. There are no reports of uncontrolled pain. She has been treated for covid 19 this past month. There are no reports of anxiety or depressive thoughts.  ? ?Past Medical History:  ?Diagnosis Date  ? Benign hypertension   ? Cataract   ? 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 Dementia ([place])   ? Diabetes mellitus with CKD   ? H/O partial nephrectomy   ? Hypokalemia   ? Hypothyroidism   ? Impaired cognition   ? Multiple myeloma ([place])   ? Dr Maylon Peppers, [facility]  ? Thyroid disease   ? ? ?Past Surgical History:  ?Procedure Laterality Date  ? ABDOMINAL HYSTERECTOMY    ? CRANIOTOMY    ? for lymphoma  ? YAG LASER APPLICATION Right 2/80/0349  ? Procedure: YAG LASER APPLICATION;  Surgeon: [name], MD;  Location: [facility];  Service: Ophthalmology;  Laterality: Right;  ? ? ?Social History  ? ?Socioeconomic History  ? Marital status: Single  ?  Spouse name: Not on file  ? Number of children: Not on file  ? Years of education: Not on file  ? Highest education level: Not on file  ?Occupational History  ? Occupation: retired   ?Tobacco Use  ? Smoking status: Never  ? Smokeless tobacco: Never  ?Vaping Use  ? Vaping Use: Never used  ?Substance and Sexual Activity  ? Alcohol use: No  ? Drug use:  No  ? Sexual activity: Never  ?Other Topics Concern  ? Not on file  ?Social History Narrative  ? Long term resident of [facility]   ? ?Social Determinants of Health  ? ?Financial Resource Strain: Not on file  ?Food Insecurity: Not on file  ?Transportation Needs: Not on file  ?Physical Activity: Not on file  ?Stress: Not on file  ?Social Connections: Not on file  ?Intimate Partner Violence: Not on file  ? ?Family History  ?Problem Relation Age of Onset  ? Depression Mother   ? Diabetes Mother   ? Heart disease Father   ? Cancer - Prostate Brother   ? Cancer Brother   ? Cancer Sister   ? ? ? ? ?VITAL SIGNS ?BP 135/75   Pulse 77   Temp (!) 97.5 ?F (36.4 ?C)   Resp 18   Ht '5\' 9"'  (1.753 m)   Wt 120 lb 1.6 oz (54.5 kg)   SpO2 98%   BMI 17.74 kg/m?  ? ?Facility-Administered Encounter Medications as of 04/10/2022  ?Medication  ? ?Outpatient Encounter Medications as of 04/10/2022  ?Medication Sig  ? acetaminophen (TYLENOL) 500 MG tablet Take 1,000 mg by mouth every 6 (six) hours as needed for mild pain.  ?  acyclovir (ZOVIRAX) 400 MG tablet TAKE 1 TABLET BY MOUTH TWICE DAILY  ? aspirin EC 81 MG tablet Take 81 mg by mouth daily.  ? busPIRone (BUSPAR) 10 MG tablet Take by mouth 2 (two) times daily. For anxiety  ? Calcium Carbonate-Vit D-Min (CALCIUM 600+D PLUS MINERALS) 600-400 MG-UNIT CHEW Chew 1 tablet by mouth daily.  ? cholestyramine (QUESTRAN) 4 g packet Take 4 g by mouth 2 (two) times daily between meals.  ? Lactobacillus Probiotic TABS Take 2 tablets by mouth daily.  ? levothyroxine (SYNTHROID, LEVOTHROID) 25 MCG tablet Take 25 mcg by mouth daily before breakfast.  ? lipase/protease/amylase (CREON) 36000 UNITS CPEP capsule 36,000-114,000- 180,000 unit; amt: 2; oral ?Special Instructions: Take 2 capsules with meals ?With Meals  ? lipase/protease/amylase (CREON) 36000 UNITS CPEP capsule 36,000-114,000- 180,000 unit; amt: 1; oral ?Special Instructions: Take one capsule with snacks if eaten between meals. ?Twice A Day Between  Meals as needed  ? loperamide (IMODIUM A-D) 2 MG tablet Take 2 mg by mouth 3 (three) times daily. For diarrhea  ? losartan (COZAAR) 50 MG tablet Take 1 tablet (50 mg total) by mouth daily.  ? NON FORMULARY Regular diet no dairy products.  ? NON FORMULARY Wanderguard #2237 to ankle for safety awareness. Check placement and function qshift. ?Special Instructions: Check placement and function qshift. ?Every Shift ?Day, Evening, Night  ? potassium chloride SA (KLOR-CON) 20 MEQ tablet Take 20 mEq by mouth daily.   ? sertraline (ZOLOFT) 25 MG tablet Take 25 mg by mouth daily.  ? ? ? ?SIGNIFICANT DIAGNOSTIC EXAMS ? ?PREVIOUS  ? ?08-10-20: t score: -3.758 ? ?03-16-22: chest x-ray:  ?No radiographic evidence of acute cardiopulmonary disease ?Mild cardiomegaly ? Mild osteopenia ?Mild osteoarthritis  ? ?NO NEW EXAMS  ?  ? ?LABS REVIEWED PREVIOUS   ? ?04-08-21: chol 142; ldl 66; trig 101; hdl 56 ?04-09-21: wbc 4.6; hgb 11.3; hct 35.3; mcv 100.6 plt 140; glucose 101; bun 20; creat 1.13; k+ 3.5; na++ 139; ca 8.5 GFR 50; liver normal albumin 3.4  ?06-18-21: wbc 4.5; hgb 10.6; hct 33.7; mcv 98.5 plt 174; glucose 111; bun 22; creat 1.20 ;k+ 3.7; na++ 141; ca 8.9; GFR 47; liver normal albumin 3.4 ?07-15-21: wbc 4.7; hgb 11.7; hct 35.9; mcv 97.0 plt 220; glucose 161; bun 21; creat 1.23; k+ 3.9; na++ 138; ca 8.8; GFR 46 liver normal albumin 3.2 uric acid 4.4 ?08-08-21: urine culture: 70,000 e-coli ?08-26-21: wbc 6.3; hgb 11.1; hct 35.3; mcv 99.2 plt 165; glucose 117; bun 16; creat 1.02; k+ 4.0; na++ 141; ca 9.1 GFR 57; liver normal albumin 3.1  ?09-05-21: hgb a1c 5.9; urine micro-albumin 7.9 ?10-07-21: wbc 5.6; hgb 10.5; hct 32.5; mcv 100.3 plt 163; glucose 105; bun 25; creat 1.14; k+ 3.8; na++ 138; ca 8.8; GFR 50 liver normal albumin 3.2 ?10-10-21: chol 153; ldl 81; trig 56; hdl 61; tsh 2.577 ?11-11-21: wbc 4.6; hgb 10.9; hct 34.5; mcv 98.6 plt 160; glucose 183; bun 25; creat 1.23; k+ 3.8; na++ 143; ca 9.1; GFR 45; liver normal albumin 3.3; mag  2.3  ?12-23-21: wbc 4,1 hgb 11.9; hct 36.8; mcv 98.1 plt 106; glucose 117; bun 20; creat 1.17; k+ 3.4; na++ 142; ca 8.9; GFR 48; liver normal albumin  3.5  mag 2.2  ?01-06-22: hgb a1c 5.9 ?03-17-22: wbc 3.6; hgb 10.9; hct 34.7; mcv 97.2 plt 121; glucose 140; bun 20; creat 1.16; k+ 3.6; na++ 144; ca 9.2 GFR 49; protein 6.7; albumin 3.4 ast 69 ?03-17-22: wbc 4.1; hgb 10.9; hct  32.5; mcv 96.2 plt 98; glucose 96; bbun 22; creat 1.17; k+ 3.5; na++ 142; ca 9.0; GFR 48 d-dimer 1.67; CRP 1.2  ?03-24-22: d-dimer 0.72 ? ?NO NEW LABS.  ? ?Review of Systems  ?Constitutional:  Negative for malaise/fatigue.  ?Respiratory:  Negative for cough and shortness of breath.   ?Cardiovascular:  Negative for chest pain, palpitations and leg swelling.  ?Gastrointestinal:  Negative for abdominal pain, constipation and heartburn.  ?Musculoskeletal:  Negative for back pain, joint pain and myalgias.  ?Skin: Negative.   ?Neurological:  Negative for dizziness.  ?Psychiatric/Behavioral:  The patient is not nervous/anxious.   ? ? ?Physical Exam ?Constitutional:   ?   General: She is not in acute distress. ?   Appearance: She is well-developed. She is not diaphoretic.  ?Neck:  ?   Thyroid: No thyromegaly.  ?Cardiovascular:  ?   Rate and Rhythm: Normal rate and regular rhythm.  ?   Pulses: Normal pulses.  ?   Heart sounds: Normal heart sounds.  ?Pulmonary:  ?   Effort: Pulmonary effort is normal. No respiratory distress.  ?   Breath sounds: Normal breath sounds.  ?Abdominal:  ?   General: Bowel sounds are normal. There is no distension.  ?   Palpations: Abdomen is soft.  ?   Tenderness: There is no abdominal tenderness.  ?Musculoskeletal:     ?   General: Normal range of motion.  ?   Cervical back: Neck supple.  ?   Right lower leg: No edema.  ?   Left lower leg: No edema.  ?Lymphadenopathy:  ?   Cervical: No cervical adenopathy.  ?Skin: ?   General: Skin is warm and dry.  ?Neurological:  ?   Mental Status: She is alert. Mental status is at baseline.   ?Psychiatric:     ?   Mood and Affect: Mood normal.  ? ? ? ?ASSESSMENT/ PLAN: ? ?TODAY ? ?Failure to thrive in adult: weight is 120 pounds is stable will monitor  ? ?2. Hypokalemia: k+ 3.5 will continue k+ 20 meq d

## 2022-04-11 ENCOUNTER — Inpatient Hospital Stay (HOSPITAL_COMMUNITY): Payer: Medicare PPO

## 2022-04-11 DIAGNOSIS — R197 Diarrhea, unspecified: Secondary | ICD-10-CM | POA: Diagnosis not present

## 2022-04-11 DIAGNOSIS — C9000 Multiple myeloma not having achieved remission: Secondary | ICD-10-CM | POA: Diagnosis not present

## 2022-04-11 DIAGNOSIS — N1832 Chronic kidney disease, stage 3b: Secondary | ICD-10-CM | POA: Insufficient documentation

## 2022-04-11 LAB — CBC WITH DIFFERENTIAL/PLATELET
Abs Immature Granulocytes: 0.01 10*3/uL (ref 0.00–0.07)
Basophils Absolute: 0 10*3/uL (ref 0.0–0.1)
Basophils Relative: 1 %
Eosinophils Absolute: 0.1 10*3/uL (ref 0.0–0.5)
Eosinophils Relative: 2 %
HCT: 34.5 % — ABNORMAL LOW (ref 36.0–46.0)
Hemoglobin: 11.2 g/dL — ABNORMAL LOW (ref 12.0–15.0)
Immature Granulocytes: 0 %
Lymphocytes Relative: 14 %
Lymphs Abs: 0.6 10*3/uL — ABNORMAL LOW (ref 0.7–4.0)
MCH: 31.9 pg (ref 26.0–34.0)
MCHC: 32.5 g/dL (ref 30.0–36.0)
MCV: 98.3 fL (ref 80.0–100.0)
Monocytes Absolute: 0.3 10*3/uL (ref 0.1–1.0)
Monocytes Relative: 7 %
Neutro Abs: 3.1 10*3/uL (ref 1.7–7.7)
Neutrophils Relative %: 76 %
Platelets: 105 10*3/uL — ABNORMAL LOW (ref 150–400)
RBC: 3.51 MIL/uL — ABNORMAL LOW (ref 3.87–5.11)
RDW: 16.4 % — ABNORMAL HIGH (ref 11.5–15.5)
WBC: 4.1 10*3/uL (ref 4.0–10.5)
nRBC: 0 % (ref 0.0–0.2)

## 2022-04-11 LAB — COMPREHENSIVE METABOLIC PANEL
ALT: 13 U/L (ref 0–44)
AST: 30 U/L (ref 15–41)
Albumin: 3.6 g/dL (ref 3.5–5.0)
Alkaline Phosphatase: 63 U/L (ref 38–126)
Anion gap: 7 (ref 5–15)
BUN: 24 mg/dL — ABNORMAL HIGH (ref 8–23)
CO2: 25 mmol/L (ref 22–32)
Calcium: 9.2 mg/dL (ref 8.9–10.3)
Chloride: 113 mmol/L — ABNORMAL HIGH (ref 98–111)
Creatinine, Ser: 1.31 mg/dL — ABNORMAL HIGH (ref 0.44–1.00)
GFR, Estimated: 42 mL/min — ABNORMAL LOW (ref >60)
Glucose, Bld: 130 mg/dL — ABNORMAL HIGH (ref 70–99)
Potassium: 4 mmol/L (ref 3.5–5.1)
Sodium: 145 mmol/L (ref 135–145)
Total Bilirubin: 0.8 mg/dL (ref 0.3–1.2)
Total Protein: 6.9 g/dL (ref 6.5–8.1)

## 2022-04-11 LAB — MAGNESIUM: Magnesium: 2.3 mg/dL (ref 1.7–2.4)

## 2022-04-11 LAB — LACTATE DEHYDROGENASE: LDH: 180 U/L (ref 98–192)

## 2022-04-14 ENCOUNTER — Other Ambulatory Visit (HOSPITAL_COMMUNITY)
Admission: RE | Admit: 2022-04-14 | Discharge: 2022-04-14 | Disposition: A | Discharge status: Home / Self Care | Payer: Medicare PPO | Source: Skilled Nursing Facility | Attending: Adult Health | Admitting: Adult Health

## 2022-04-14 ENCOUNTER — Inpatient Hospital Stay (HOSPITAL_COMMUNITY): Payer: Medicare PPO | Attending: Hematology

## 2022-04-14 ENCOUNTER — Inpatient Hospital Stay (HOSPITAL_BASED_OUTPATIENT_CLINIC_OR_DEPARTMENT_OTHER): Payer: Medicare PPO | Admitting: Hematology

## 2022-04-14 VITALS — BP 135/77 | HR 66 | Temp 97.6°F | Resp 18 | Ht 69.0 in | Wt 116.0 lb

## 2022-04-14 DIAGNOSIS — N390 Urinary tract infection, site not specified: Secondary | ICD-10-CM | POA: Insufficient documentation

## 2022-04-14 DIAGNOSIS — C9000 Multiple myeloma not having achieved remission: Secondary | ICD-10-CM

## 2022-04-14 DIAGNOSIS — R4182 Altered mental status, unspecified: Secondary | ICD-10-CM | POA: Diagnosis not present

## 2022-04-14 DIAGNOSIS — Z8572 Personal history of non-Hodgkin lymphomas: Secondary | ICD-10-CM | POA: Diagnosis not present

## 2022-04-14 DIAGNOSIS — F0390 Unspecified dementia without behavioral disturbance: Secondary | ICD-10-CM | POA: Insufficient documentation

## 2022-04-14 DIAGNOSIS — N189 Chronic kidney disease, unspecified: Secondary | ICD-10-CM | POA: Diagnosis not present

## 2022-04-14 DIAGNOSIS — Z5111 Encounter for antineoplastic chemotherapy: Secondary | ICD-10-CM | POA: Diagnosis not present

## 2022-04-14 DIAGNOSIS — R5383 Other fatigue: Secondary | ICD-10-CM | POA: Diagnosis not present

## 2022-04-14 DIAGNOSIS — R8290 Unspecified abnormal findings in urine: Secondary | ICD-10-CM | POA: Insufficient documentation

## 2022-04-14 LAB — URINALYSIS, ROUTINE W REFLEX MICROSCOPIC
Bilirubin Urine: NEGATIVE
Glucose, UA: NEGATIVE mg/dL
Hgb urine dipstick: NEGATIVE
Ketones, ur: NEGATIVE mg/dL
Leukocytes,Ua: NEGATIVE
Nitrite: POSITIVE — AB
Protein, ur: NEGATIVE mg/dL
Specific Gravity, Urine: 1.017 (ref 1.005–1.030)
pH: 5 (ref 5.0–8.0)

## 2022-04-14 LAB — PROTEIN ELECTROPHORESIS, SERUM
A/G Ratio: 1.1 (ref 0.7–1.7)
Albumin ELP: 3.3 g/dL (ref 2.9–4.4)
Alpha-1-Globulin: 0.2 g/dL (ref 0.0–0.4)
Alpha-2-Globulin: 0.8 g/dL (ref 0.4–1.0)
Beta Globulin: 0.7 g/dL (ref 0.7–1.3)
Gamma Globulin: 1.2 g/dL (ref 0.4–1.8)
Globulin, Total: 2.9 g/dL (ref 2.2–3.9)
M-Spike, %: 0.7 g/dL — ABNORMAL HIGH
Total Protein ELP: 6.2 g/dL (ref 6.0–8.5)

## 2022-04-14 LAB — KAPPA/LAMBDA LIGHT CHAINS
Kappa free light chain: 28.5 mg/L — ABNORMAL HIGH (ref 3.3–19.4)
Kappa, lambda light chain ratio: 1.06 (ref 0.26–1.65)
Lambda free light chains: 26.8 mg/L — ABNORMAL HIGH (ref 5.7–26.3)

## 2022-04-14 MED ORDER — BORTEZOMIB CHEMO SQ INJECTION 3.5 MG (2.5MG/ML)
1.3000 mg/m2 | Freq: Once | INTRAMUSCULAR | Status: AC
  Administered 2022-04-14: 2 mg via SUBCUTANEOUS
  Filled 2022-04-14: qty 0.8

## 2022-04-14 MED ORDER — DEXAMETHASONE 4 MG PO TABS
10.0000 mg | ORAL_TABLET | Freq: Once | ORAL | Status: AC
  Administered 2022-04-14: 10 mg via ORAL
  Filled 2022-04-14: qty 3

## 2022-04-14 NOTE — Patient Instructions (Signed)
[place]  Discharge Instructions: ?Thank you for choosing [facility] to provide your oncology and hematology care.  ?If you have a lab appointment with the [facility], please come in thru the Main Entrance and check in at the main information desk. ? ?Wear comfortable clothing and clothing appropriate for easy access to any Portacath or PICC line.  ? ?We strive to give you quality time with your provider. You may need to reschedule your appointment if you arrive late (15 or more minutes).  Arriving late affects you and other patients whose appointments are after yours.  Also, if you miss three or more appointments without notifying the office, you may be dismissed from the clinic at the provider?s discretion.    ?  ?For prescription refill requests, have your pharmacy contact our office and allow 72 hours for refills to be completed.   ? ?Today you received the following chemotherapy and/or immunotherapy agents Velcade    ?  ?To help prevent nausea and vomiting after your treatment, we encourage you to take your nausea medication as directed. ? ?BELOW ARE SYMPTOMS THAT SHOULD BE REPORTED IMMEDIATELY: ?*FEVER GREATER THAN 100.4 F (38 ?C) OR HIGHER ?*CHILLS OR SWEATING ?*NAUSEA AND VOMITING THAT IS NOT CONTROLLED WITH YOUR NAUSEA MEDICATION ?*UNUSUAL SHORTNESS OF BREATH ?*UNUSUAL BRUISING OR BLEEDING ?*URINARY PROBLEMS (pain or burning when urinating, or frequent urination) ?*BOWEL PROBLEMS (unusual diarrhea, constipation, pain near the anus) ?TENDERNESS IN MOUTH AND THROAT WITH OR WITHOUT PRESENCE OF ULCERS (sore throat, sores in mouth, or a toothache) ?UNUSUAL RASH, SWELLING OR PAIN  ?UNUSUAL VAGINAL DISCHARGE OR ITCHING  ? ?Items with * indicate a potential emergency and should be followed up as soon as possible or go to the Emergency Department if any problems should occur. ? ?Please show the CHEMOTHERAPY ALERT CARD or IMMUNOTHERAPY ALERT CARD at check-in to the Emergency  Department and triage nurse. ? ?Should you have questions after your visit or need to cancel or reschedule your appointment, please contact [facility] [phone]  and follow the prompts.  Office hours are 8:00 a.m. to 4:30 p.m. Monday - Friday. Please note that voicemails left after 4:00 p.m. may not be returned until the following business day.  We are closed weekends and major holidays. You have access to a nurse at all times for urgent questions. Please call the main number to the clinic [phone] and follow the prompts. ? ?For any non-urgent questions, you may also contact your provider using MyChart. We now offer e-Visits for anyone 57 and older to request care online for non-urgent symptoms. For details visit [email or web address]. ?  ?Also download the MyChart app! Go to the app store, search "MyChart", open the app, select [place], and log in with your MyChart username and password. ? ?Due to Covid, a mask is required upon entering the hospital/clinic. If you do not have a mask, one will be given to you upon arrival. For doctor visits, patients may have 1 support person aged 13 or older with them. For treatment visits, patients cannot have anyone with them due to current Covid guidelines and our immunocompromised population.  ?

## 2022-04-14 NOTE — Progress Notes (Signed)
Amanda Wu presents today for Velcade injection per the provider's orders.  Stable during administration without incident; injection site WNL; see MAR for injection details.  Patient tolerated procedure well and without incident.  No questions or complaints noted at this time.  Discharge from clinic via wheelchair in stable condition.  Alert and oriented X 3.  Follow up with [facility] as scheduled.  ?

## 2022-04-14 NOTE — Progress Notes (Signed)
? ?[facility] ?[address] ?[place], [place] [address] ? ? ?CLINIC:  ?Medical Oncology/Hematology ? ?PCP:  ?Gerlene Fee, NP ?[address] [place] [address] ?[phone] ? ? ?REASON FOR VISIT:  ?Follow-up for multiple myeloma ? ?PRIOR THERAPY: none ? ?NGS Results: not done ? ?CURRENT THERAPY: Velcade & Decadron 3/4 weeks; Revlimid 2/4 weeks ? ?BRIEF ONCOLOGIC HISTORY:  ?Oncology History  ?Multiple myeloma ([place])  ?07/09/2017 Initial Diagnosis  ? Multiple myeloma ([place]) ? ?  ?06/01/2018 -  Chemotherapy  ? Patient is on Treatment Plan : MYELOMA MAINTENANCE Bortezomib SQ D1,8,15 / Dexamethasone Oral D1,8,15 q28d   ? ?  ?  ? ? ?CANCER STAGING: ? Cancer Staging  ?No matching staging information was found for the patient. ? ?INTERVAL HISTORY:  ?[name], a 76 y.o. female, returns for routine follow-up and consideration for next cycle of chemotherapy. [name] was last seen on 01/14/2022. ? ?Due for cycle #49 of Velcade today.  ? ?Overall, she tells me she has been feeling pretty well. She denies new pains. She reports chills. She a Covid infection about 2 weeks ago. She denies cough.  ? ?Overall, she feels ready for next cycle of chemo today.  ? ?REVIEW OF SYSTEMS:  ?Review of Systems  ?Constitutional:  Positive for chills. Negative for appetite change and fatigue.  ?Respiratory:  Negative for cough.   ?Neurological:  Positive for headaches.  ?Psychiatric/Behavioral:  Positive for confusion and sleep disturbance. The patient is nervous/anxious.   ?All other systems reviewed and are negative. ? ?PAST MEDICAL/SURGICAL HISTORY:  ?Past Medical History:  ?Diagnosis Date  ? Benign hypertension   ? Cataract   ? 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 Dementia ([place])   ? Diabetes mellitus with CKD   ? H/O partial nephrectomy   ? Hypokalemia   ? Hypothyroidism   ? Impaired cognition   ? Multiple myeloma ([place])   ? Dr Maylon Peppers, [facility]  ? Thyroid disease   ? ?Past Surgical History:  ?Procedure Laterality  Date  ? ABDOMINAL HYSTERECTOMY    ? CRANIOTOMY    ? for lymphoma  ? YAG LASER APPLICATION Right 3/32/9518  ? Procedure: YAG LASER APPLICATION;  Surgeon: [name], MD;  Location: [facility];  Service: Ophthalmology;  Laterality: Right;  ? ? ?SOCIAL HISTORY:  ?Social History  ? ?Socioeconomic History  ? Marital status: Single  ?  Spouse name: Not on file  ? Number of children: Not on file  ? Years of education: Not on file  ? Highest education level: Not on file  ?Occupational History  ? Occupation: retired   ?Tobacco Use  ? Smoking status: Never  ? Smokeless tobacco: Never  ?Vaping Use  ? Vaping Use: Never used  ?Substance and Sexual Activity  ? Alcohol use: No  ? Drug use: No  ? Sexual activity: Never  ?Other Topics Concern  ? Not on file  ?Social History Narrative  ? Long term resident of [facility]   ? ?Social Determinants of Health  ? ?Financial Resource Strain: Not on file  ?Food Insecurity: Not on file  ?Transportation Needs: Not on file  ?Physical Activity: Not on file  ?Stress: Not on file  ?Social Connections: Not on file  ?Intimate Partner Violence: Not on file  ? ? ?FAMILY HISTORY:  ?Family History  ?Problem Relation Age of Onset  ? Depression Mother   ? Diabetes Mother   ? Heart disease Father   ? Cancer -  Prostate Brother   ? Cancer Brother   ? Cancer Sister   ? ? ?CURRENT MEDICATIONS:  ?No current facility-administered medications for this visit.  ? ?No current outpatient medications on file.  ? ?Facility-Administered Medications Ordered in Other Visits  ?Medication Dose Route Frequency Provider Last Rate Last Admin  ? bortezomib SQ (VELCADE) chemo injection (2.20m/mL concentration) 2 mg  1.3 mg/m2 (Order-Specific) Subcutaneous Once KDerek Jack MD      ? cyanocobalamin ((VITAMIN B-12)) 1000 MCG/ML injection           ? cyanocobalamin ((VITAMIN B-12)) 1000 MCG/ML injection           ? denosumab (XGEVA) 120 MG/1.7ML injection           ? dexamethasone (DECADRON) 4 MG tablet           ?  dexamethasone (DECADRON) 4 MG tablet           ? dexamethasone (DECADRON) tablet 10 mg  10 mg Oral Once KDerek Jack MD      ? diphenhydrAMINE (BENADRYL) 50 MG/ML injection           ? diphenhydrAMINE (BENADRYL) 50 MG/ML injection           ? diphenhydrAMINE (BENADRYL) 50 MG/ML injection           ? epoetin alfa-epbx (RETACRIT) 269678UNIT/ML injection           ? epoetin alfa-epbx (RETACRIT) 293810UNIT/ML injection           ? epoetin alfa-epbx (RETACRIT) 417510UNIT/ML injection           ? fulvestrant (FASLODEX) 250 MG/5ML injection           ? magnesium sulfate 2 GM/50ML IVPB           ? octreotide (SANDOSTATIN LAR) 30 MG IM injection           ? palonosetron (ALOXI) 0.25 MG/5ML injection           ? palonosetron (ALOXI) 0.25 MG/5ML injection           ? palonosetron (ALOXI) 0.25 MG/5ML injection           ? palonosetron (ALOXI) 0.25 MG/5ML injection           ? potassium chloride 10 MEQ/100ML IVPB           ? potassium chloride SA (KLOR-CON M) 20 MEQ CR tablet           ? potassium chloride SA (KLOR-CON M) 20 MEQ CR tablet           ? prochlorperazine (COMPAZINE) 10 MG tablet           ? prochlorperazine (COMPAZINE) 10 MG tablet           ? prochlorperazine (COMPAZINE) tablet 10 mg  10 mg Oral Once KDerek Jack MD      ? ? ?ALLERGIES:  ?Allergies  ?Allergen Reactions  ? Lotensin [Benazepril]   ? ? ?PHYSICAL EXAM:  ?Performance status (ECOG): 2 - Symptomatic, <50% confined to bed ? ?Vitals:  ? 04/14/22 0849  ?BP: 135/77  ?Pulse: 66  ?Resp: 18  ?Temp: 97.6 ?F (36.4 ?C)  ?SpO2: 100%  ? ?Wt Readings from Last 3 Encounters:  ?04/14/22 116 lb (52.6 kg)  ?04/10/22 120 lb 1.6 oz (54.5 kg)  ?04/04/22 120 lb 1.6 oz (54.5 kg)  ? ?Physical Exam ?Vitals reviewed.  ?Constitutional:   ?   Appearance: Normal appearance.  ?  Comments: In wheelchair  ?Cardiovascular:  ?   Rate and Rhythm: Normal rate and regular rhythm.  ?   Pulses: Normal pulses.  ?   Heart sounds: Normal heart sounds.  ?Pulmonary:  ?   Effort:  Pulmonary effort is normal.  ?   Breath sounds: Normal breath sounds.  ?Abdominal:  ?   Palpations: Abdomen is soft.  ?   Tenderness: There is no abdominal tenderness.  ?Neurological:  ?   General: No focal deficit present.  ?   Mental Status: She is alert and oriented to person, place, and time.  ?Psychiatric:     ?   Mood and Affect: Mood normal.     ?   Behavior: Behavior normal.  ? ? ?LABORATORY DATA:  ?I have reviewed the labs as listed.  ? ?  Latest Ref Rng & Units 04/11/2022  ? 11:23 AM 03/21/2022  ?  3:36 PM 03/17/2022  ?  6:36 PM  ?CBC  ?WBC 4.0 - 10.5 K/uL 4.1   4.1   4.1    ?Hemoglobin 12.0 - 15.0 g/dL 11.2   10.6   10.9    ?Hematocrit 36.0 - 46.0 % 34.5   32.9   32.5    ?Platelets 150 - 400 K/uL 105   89   98    ? ? ?  Latest Ref Rng & Units 04/11/2022  ? 11:23 AM 03/21/2022  ?  3:36 PM 03/17/2022  ?  6:36 PM  ?CMP  ?Glucose 70 - 99 mg/dL 130   111   96    ?BUN 8 - 23 mg/dL 24   35   22    ?Creatinine 0.44 - 1.00 mg/dL 1.31   1.47   1.17    ?Sodium 135 - 145 mmol/L 145   145   142    ?Potassium 3.5 - 5.1 mmol/L 4.0   3.9   3.5    ?Chloride 98 - 111 mmol/L 113   115   114    ?CO2 22 - 32 mmol/L '25   24   22    ' ?Calcium 8.9 - 10.3 mg/dL 9.2   8.5   9.0    ?Total Protein 6.5 - 8.1 g/dL 6.9      ?Total Bilirubin 0.3 - 1.2 mg/dL 0.8      ?Alkaline Phos 38 - 126 U/L 63      ?AST 15 - 41 U/L 30      ?ALT 0 - 44 U/L 13      ? ? ?DIAGNOSTIC IMAGING:  ?I have independently reviewed the scans and discussed with the patient. ?No results found.  ? ?ASSESSMENT:  ?1.  IgG lambda multiple myeloma: ?-She is on Revlimid 10 mg 2 weeks on/2 weeks of along with Velcade 3 weeks on/1 week off.  Dexamethasone 10 mg on days of Velcade. ?-Myeloma panel on 04/17/2020 shows M spike 0.5 g.  Kappa light chains are 44.  Lambda light chains are 34.  Ratio is 1.3. ?-Resident of [facility] since 07/19/2020. ?  ?2.  Dementia: ?-This is from whole brain RT from CNS lymphoma several years ago. ? ? ?PLAN:  ?1.  IgG lambda multiple myeloma: ?- Reviewed  myeloma labs from 03/17/2022.  M spike is 0.6 and stable for the last 3 times.  Free light chain ratio is normal. ?- She is tolerating Velcade 3 weeks on/1 week off very well. ?- She is feeling sluggish today and ha

## 2022-04-14 NOTE — Progress Notes (Signed)
Patient has been examined by Dr. Katragadda, and vital signs and labs have been reviewed. ANC, Creatinine, LFTs, hemoglobin, and platelets are within treatment parameters per M.D. - pt may proceed with treatment.    °

## 2022-04-16 ENCOUNTER — Inpatient Hospital Stay (HOSPITAL_COMMUNITY): Payer: Medicare PPO

## 2022-04-16 ENCOUNTER — Telehealth (HOSPITAL_COMMUNITY): Payer: Self-pay | Admitting: *Deleted

## 2022-04-16 DIAGNOSIS — R1312 Dysphagia, oropharyngeal phase: Secondary | ICD-10-CM | POA: Diagnosis not present

## 2022-04-16 DIAGNOSIS — F0390 Unspecified dementia without behavioral disturbance: Secondary | ICD-10-CM | POA: Diagnosis not present

## 2022-04-16 NOTE — Telephone Encounter (Signed)
Placed call to [facility] and verbal order with read back given per Dr. Delton Coombes for Cipro 500 mg po bid x 7 days.  ?

## 2022-04-17 ENCOUNTER — Inpatient Hospital Stay (HOSPITAL_COMMUNITY): Payer: Medicare PPO

## 2022-04-17 DIAGNOSIS — F0390 Unspecified dementia without behavioral disturbance: Secondary | ICD-10-CM | POA: Diagnosis not present

## 2022-04-17 DIAGNOSIS — R1312 Dysphagia, oropharyngeal phase: Secondary | ICD-10-CM | POA: Diagnosis not present

## 2022-04-18 DIAGNOSIS — F0390 Unspecified dementia without behavioral disturbance: Secondary | ICD-10-CM | POA: Diagnosis not present

## 2022-04-18 DIAGNOSIS — R1312 Dysphagia, oropharyngeal phase: Secondary | ICD-10-CM | POA: Diagnosis not present

## 2022-04-18 LAB — URINE CULTURE: Culture: >=100000 — AB

## 2022-04-20 DIAGNOSIS — F0390 Unspecified dementia without behavioral disturbance: Secondary | ICD-10-CM | POA: Diagnosis not present

## 2022-04-20 DIAGNOSIS — R1312 Dysphagia, oropharyngeal phase: Secondary | ICD-10-CM | POA: Diagnosis not present

## 2022-04-21 ENCOUNTER — Inpatient Hospital Stay (HOSPITAL_COMMUNITY): Payer: Medicare PPO

## 2022-04-21 DIAGNOSIS — C9000 Multiple myeloma not having achieved remission: Secondary | ICD-10-CM

## 2022-04-21 DIAGNOSIS — N189 Chronic kidney disease, unspecified: Secondary | ICD-10-CM | POA: Diagnosis not present

## 2022-04-21 DIAGNOSIS — R5383 Other fatigue: Secondary | ICD-10-CM | POA: Diagnosis not present

## 2022-04-21 DIAGNOSIS — F0390 Unspecified dementia without behavioral disturbance: Secondary | ICD-10-CM | POA: Diagnosis not present

## 2022-04-21 DIAGNOSIS — Z8572 Personal history of non-Hodgkin lymphomas: Secondary | ICD-10-CM | POA: Diagnosis not present

## 2022-04-21 DIAGNOSIS — R1312 Dysphagia, oropharyngeal phase: Secondary | ICD-10-CM | POA: Diagnosis not present

## 2022-04-21 DIAGNOSIS — R8290 Unspecified abnormal findings in urine: Secondary | ICD-10-CM | POA: Diagnosis not present

## 2022-04-21 DIAGNOSIS — Z5111 Encounter for antineoplastic chemotherapy: Secondary | ICD-10-CM | POA: Diagnosis not present

## 2022-04-21 LAB — CBC WITH DIFFERENTIAL/PLATELET
Abs Immature Granulocytes: 0.01 10*3/uL (ref 0.00–0.07)
Basophils Absolute: 0 10*3/uL (ref 0.0–0.1)
Basophils Relative: 0 %
Eosinophils Absolute: 0.2 10*3/uL (ref 0.0–0.5)
Eosinophils Relative: 3 %
HCT: 33.7 % — ABNORMAL LOW (ref 36.0–46.0)
Hemoglobin: 10.8 g/dL — ABNORMAL LOW (ref 12.0–15.0)
Immature Granulocytes: 0 %
Lymphocytes Relative: 10 %
Lymphs Abs: 0.5 10*3/uL — ABNORMAL LOW (ref 0.7–4.0)
MCH: 32 pg (ref 26.0–34.0)
MCHC: 32 g/dL (ref 30.0–36.0)
MCV: 99.7 fL (ref 80.0–100.0)
Monocytes Absolute: 0.4 10*3/uL (ref 0.1–1.0)
Monocytes Relative: 8 %
Neutro Abs: 3.7 10*3/uL (ref 1.7–7.7)
Neutrophils Relative %: 79 %
Platelets: 86 10*3/uL — ABNORMAL LOW (ref 150–400)
RBC: 3.38 MIL/uL — ABNORMAL LOW (ref 3.87–5.11)
RDW: 16.9 % — ABNORMAL HIGH (ref 11.5–15.5)
WBC: 4.8 10*3/uL (ref 4.0–10.5)
nRBC: 0 % (ref 0.0–0.2)

## 2022-04-21 LAB — COMPREHENSIVE METABOLIC PANEL
ALT: 16 U/L (ref 0–44)
AST: 27 U/L (ref 15–41)
Albumin: 3.1 g/dL — ABNORMAL LOW (ref 3.5–5.0)
Alkaline Phosphatase: 53 U/L (ref 38–126)
Anion gap: 5 (ref 5–15)
BUN: 23 mg/dL (ref 8–23)
CO2: 21 mmol/L — ABNORMAL LOW (ref 22–32)
Calcium: 8.7 mg/dL — ABNORMAL LOW (ref 8.9–10.3)
Chloride: 119 mmol/L — ABNORMAL HIGH (ref 98–111)
Creatinine, Ser: 1.3 mg/dL — ABNORMAL HIGH (ref 0.44–1.00)
GFR, Estimated: 42 mL/min — ABNORMAL LOW (ref >60)
Glucose, Bld: 165 mg/dL — ABNORMAL HIGH (ref 70–99)
Potassium: 3.5 mmol/L (ref 3.5–5.1)
Sodium: 145 mmol/L (ref 135–145)
Total Bilirubin: 0.4 mg/dL (ref 0.3–1.2)
Total Protein: 6.3 g/dL — ABNORMAL LOW (ref 6.5–8.1)

## 2022-04-21 LAB — MAGNESIUM: Magnesium: 2.2 mg/dL (ref 1.7–2.4)

## 2022-04-22 ENCOUNTER — Inpatient Hospital Stay (HOSPITAL_COMMUNITY): Payer: Medicare PPO

## 2022-04-22 VITALS — BP 110/71 | HR 84 | Temp 98.1°F | Resp 16 | Ht 69.0 in | Wt 110.4 lb

## 2022-04-22 DIAGNOSIS — C9000 Multiple myeloma not having achieved remission: Secondary | ICD-10-CM | POA: Diagnosis not present

## 2022-04-22 DIAGNOSIS — R5383 Other fatigue: Secondary | ICD-10-CM | POA: Diagnosis not present

## 2022-04-22 DIAGNOSIS — F0390 Unspecified dementia without behavioral disturbance: Secondary | ICD-10-CM | POA: Diagnosis not present

## 2022-04-22 DIAGNOSIS — Z8572 Personal history of non-Hodgkin lymphomas: Secondary | ICD-10-CM | POA: Diagnosis not present

## 2022-04-22 DIAGNOSIS — N189 Chronic kidney disease, unspecified: Secondary | ICD-10-CM | POA: Diagnosis not present

## 2022-04-22 DIAGNOSIS — Z5111 Encounter for antineoplastic chemotherapy: Secondary | ICD-10-CM | POA: Diagnosis not present

## 2022-04-22 DIAGNOSIS — R8290 Unspecified abnormal findings in urine: Secondary | ICD-10-CM | POA: Diagnosis not present

## 2022-04-22 MED ORDER — BORTEZOMIB CHEMO SQ INJECTION 3.5 MG (2.5MG/ML)
1.3000 mg/m2 | Freq: Once | INTRAMUSCULAR | Status: AC
  Administered 2022-04-22: 2 mg via SUBCUTANEOUS
  Filled 2022-04-22: qty 0.8

## 2022-04-22 MED ORDER — OCTREOTIDE ACETATE 20 MG IM KIT
PACK | INTRAMUSCULAR | Status: AC
  Filled 2022-04-22: qty 1

## 2022-04-22 MED ORDER — DEXAMETHASONE 4 MG PO TABS
10.0000 mg | ORAL_TABLET | Freq: Once | ORAL | Status: AC
  Administered 2022-04-22: 10 mg via ORAL
  Filled 2022-04-22: qty 3

## 2022-04-22 NOTE — Progress Notes (Signed)
Amanda Wu presents today for Velcade injection per the provider's orders.  Stable during administration without incident; injection site WNL; see MAR for injection details.  Patient tolerated procedure well and without incident.  No questions or complaints noted at this time. Discharge from clinic via wheelchair in stable condition.  Alert and oriented X 3.  Follow up with [facility] as scheduled.  ?

## 2022-04-22 NOTE — Patient Instructions (Signed)
[facility]  Discharge Instructions: Thank you for choosing [facility] to provide your oncology and hematology care.  If you have a lab appointment with the Cancer Center, please come in thru the Main Entrance and check in at the main information desk.  Wear comfortable clothing and clothing appropriate for easy access to any Portacath or PICC line.   We strive to give you quality time with your provider. You may need to reschedule your appointment if you arrive late (15 or more minutes).  Arriving late affects you and other patients whose appointments are after yours.  Also, if you miss three or more appointments without notifying the office, you may be dismissed from the clinic at the provider's discretion.      For prescription refill requests, have your pharmacy contact our office and allow 72 hours for refills to be completed.    Today you received the following chemotherapy and/or immunotherapy agents Retacrit      To help prevent nausea and vomiting after your treatment, we encourage you to take your nausea medication as directed.  BELOW ARE SYMPTOMS THAT SHOULD BE REPORTED IMMEDIATELY: *FEVER GREATER THAN 100.4 F (38 C) OR HIGHER *CHILLS OR SWEATING *NAUSEA AND VOMITING THAT IS NOT CONTROLLED WITH YOUR NAUSEA MEDICATION *UNUSUAL SHORTNESS OF BREATH *UNUSUAL BRUISING OR BLEEDING *URINARY PROBLEMS (pain or burning when urinating, or frequent urination) *BOWEL PROBLEMS (unusual diarrhea, constipation, pain near the anus) TENDERNESS IN MOUTH AND THROAT WITH OR WITHOUT PRESENCE OF ULCERS (sore throat, sores in mouth, or a toothache) UNUSUAL RASH, SWELLING OR PAIN  UNUSUAL VAGINAL DISCHARGE OR ITCHING   Items with * indicate a potential emergency and should be followed up as soon as possible or go to the Emergency Department if any problems should occur.  Please show the CHEMOTHERAPY ALERT CARD or IMMUNOTHERAPY ALERT CARD at check-in to the Emergency  Department and triage nurse.  Should you have questions after your visit or need to cancel or reschedule your appointment, please contact [facility] [phone]  and follow the prompts.  Office hours are 8:00 a.m. to 4:30 p.m. Monday - Friday. Please note that voicemails left after 4:00 p.m. may not be returned until the following business day.  We are closed weekends and major holidays. You have access to a nurse at all times for urgent questions. Please call the main number to the clinic [phone] and follow the prompts.  For any non-urgent questions, you may also contact your provider using MyChart. We now offer e-Visits for anyone 18 and older to request care online for non-urgent symptoms. For details visit [email or web address].   Also download the MyChart app! Go to the app store, search "MyChart", open the app, select [facility], and log in with your MyChart username and password.  Due to Covid, a mask is required upon entering the hospital/clinic. If you do not have a mask, one will be given to you upon arrival. For doctor visits, patients may have 1 support person aged 18 or older with them. For treatment visits, patients cannot have anyone with them due to current Covid guidelines and our immunocompromised population.  

## 2022-04-23 DIAGNOSIS — R1312 Dysphagia, oropharyngeal phase: Secondary | ICD-10-CM | POA: Diagnosis not present

## 2022-04-23 DIAGNOSIS — F0390 Unspecified dementia without behavioral disturbance: Secondary | ICD-10-CM | POA: Diagnosis not present

## 2022-04-24 DIAGNOSIS — R1312 Dysphagia, oropharyngeal phase: Secondary | ICD-10-CM | POA: Diagnosis not present

## 2022-04-24 DIAGNOSIS — F0390 Unspecified dementia without behavioral disturbance: Secondary | ICD-10-CM | POA: Diagnosis not present

## 2022-04-25 DIAGNOSIS — R1312 Dysphagia, oropharyngeal phase: Secondary | ICD-10-CM | POA: Diagnosis not present

## 2022-04-25 DIAGNOSIS — F0390 Unspecified dementia without behavioral disturbance: Secondary | ICD-10-CM | POA: Diagnosis not present

## 2022-04-27 DIAGNOSIS — F0390 Unspecified dementia without behavioral disturbance: Secondary | ICD-10-CM | POA: Diagnosis not present

## 2022-04-27 DIAGNOSIS — R1312 Dysphagia, oropharyngeal phase: Secondary | ICD-10-CM | POA: Diagnosis not present

## 2022-04-28 ENCOUNTER — Inpatient Hospital Stay (HOSPITAL_COMMUNITY): Payer: Medicare PPO

## 2022-04-28 DIAGNOSIS — C9000 Multiple myeloma not having achieved remission: Secondary | ICD-10-CM | POA: Diagnosis not present

## 2022-04-28 DIAGNOSIS — Z8572 Personal history of non-Hodgkin lymphomas: Secondary | ICD-10-CM | POA: Diagnosis not present

## 2022-04-28 DIAGNOSIS — R5383 Other fatigue: Secondary | ICD-10-CM | POA: Diagnosis not present

## 2022-04-28 DIAGNOSIS — R8290 Unspecified abnormal findings in urine: Secondary | ICD-10-CM | POA: Diagnosis not present

## 2022-04-28 DIAGNOSIS — N189 Chronic kidney disease, unspecified: Secondary | ICD-10-CM | POA: Diagnosis not present

## 2022-04-28 DIAGNOSIS — F0390 Unspecified dementia without behavioral disturbance: Secondary | ICD-10-CM | POA: Diagnosis not present

## 2022-04-28 DIAGNOSIS — Z5111 Encounter for antineoplastic chemotherapy: Secondary | ICD-10-CM | POA: Diagnosis not present

## 2022-04-28 LAB — CBC WITH DIFFERENTIAL/PLATELET
Abs Immature Granulocytes: 0.01 10*3/uL (ref 0.00–0.07)
Basophils Absolute: 0 10*3/uL (ref 0.0–0.1)
Basophils Relative: 0 %
Eosinophils Absolute: 0.1 10*3/uL (ref 0.0–0.5)
Eosinophils Relative: 2 %
HCT: 34.3 % — ABNORMAL LOW (ref 36.0–46.0)
Hemoglobin: 11.1 g/dL — ABNORMAL LOW (ref 12.0–15.0)
Immature Granulocytes: 0 %
Lymphocytes Relative: 11 %
Lymphs Abs: 0.6 10*3/uL — ABNORMAL LOW (ref 0.7–4.0)
MCH: 31 pg (ref 26.0–34.0)
MCHC: 32.4 g/dL (ref 30.0–36.0)
MCV: 95.8 fL (ref 80.0–100.0)
Monocytes Absolute: 0.4 10*3/uL (ref 0.1–1.0)
Monocytes Relative: 8 %
Neutro Abs: 4.3 10*3/uL (ref 1.7–7.7)
Neutrophils Relative %: 79 %
Platelets: 107 10*3/uL — ABNORMAL LOW (ref 150–400)
RBC: 3.58 MIL/uL — ABNORMAL LOW (ref 3.87–5.11)
RDW: 16.2 % — ABNORMAL HIGH (ref 11.5–15.5)
WBC: 5.5 10*3/uL (ref 4.0–10.5)
nRBC: 0 % (ref 0.0–0.2)

## 2022-04-28 LAB — COMPREHENSIVE METABOLIC PANEL
ALT: 16 U/L (ref 0–44)
AST: 26 U/L (ref 15–41)
Albumin: 3.4 g/dL — ABNORMAL LOW (ref 3.5–5.0)
Alkaline Phosphatase: 54 U/L (ref 38–126)
Anion gap: 4 — ABNORMAL LOW (ref 5–15)
BUN: 21 mg/dL (ref 8–23)
CO2: 25 mmol/L (ref 22–32)
Calcium: 8.5 mg/dL — ABNORMAL LOW (ref 8.9–10.3)
Chloride: 111 mmol/L (ref 98–111)
Creatinine, Ser: 1.4 mg/dL — ABNORMAL HIGH (ref 0.44–1.00)
GFR, Estimated: 39 mL/min — ABNORMAL LOW (ref >60)
Glucose, Bld: 135 mg/dL — ABNORMAL HIGH (ref 70–99)
Potassium: 3.7 mmol/L (ref 3.5–5.1)
Sodium: 140 mmol/L (ref 135–145)
Total Bilirubin: 0.5 mg/dL (ref 0.3–1.2)
Total Protein: 6.6 g/dL (ref 6.5–8.1)

## 2022-04-28 LAB — MAGNESIUM: Magnesium: 2.2 mg/dL (ref 1.7–2.4)

## 2022-04-28 MED ORDER — LANREOTIDE ACETATE 120 MG/0.5ML ~~LOC~~ SOLN
SUBCUTANEOUS | Status: AC
  Filled 2022-04-28: qty 120

## 2022-04-29 ENCOUNTER — Inpatient Hospital Stay (HOSPITAL_COMMUNITY): Payer: Medicare PPO

## 2022-04-29 VITALS — BP 133/79 | HR 93 | Temp 97.4°F | Resp 16 | Ht 69.0 in | Wt 114.8 lb

## 2022-04-29 DIAGNOSIS — C9000 Multiple myeloma not having achieved remission: Secondary | ICD-10-CM

## 2022-04-29 DIAGNOSIS — R5383 Other fatigue: Secondary | ICD-10-CM | POA: Diagnosis not present

## 2022-04-29 DIAGNOSIS — R8290 Unspecified abnormal findings in urine: Secondary | ICD-10-CM | POA: Diagnosis not present

## 2022-04-29 DIAGNOSIS — F0390 Unspecified dementia without behavioral disturbance: Secondary | ICD-10-CM | POA: Diagnosis not present

## 2022-04-29 DIAGNOSIS — N189 Chronic kidney disease, unspecified: Secondary | ICD-10-CM | POA: Diagnosis not present

## 2022-04-29 DIAGNOSIS — Z5111 Encounter for antineoplastic chemotherapy: Secondary | ICD-10-CM | POA: Diagnosis not present

## 2022-04-29 DIAGNOSIS — Z8572 Personal history of non-Hodgkin lymphomas: Secondary | ICD-10-CM | POA: Diagnosis not present

## 2022-04-29 MED ORDER — DEXAMETHASONE 4 MG PO TABS
10.0000 mg | ORAL_TABLET | Freq: Once | ORAL | Status: AC
  Administered 2022-04-29: 10 mg via ORAL
  Filled 2022-04-29: qty 3

## 2022-04-29 MED ORDER — BORTEZOMIB CHEMO SQ INJECTION 3.5 MG (2.5MG/ML)
1.3000 mg/m2 | Freq: Once | INTRAMUSCULAR | Status: AC
  Administered 2022-04-29: 2 mg via SUBCUTANEOUS
  Filled 2022-04-29: qty 0.8

## 2022-04-29 NOTE — Progress Notes (Signed)
Patient tolerated Velcade injection with no complaints voiced. Lab work reviewed. See MAR for details. Injection site clean and dry with no bruising or swelling noted. Patient stable during and after injection. Band aid applied. VSS. Patient left in satisfactory condition with no s/s of distress noted. 

## 2022-04-29 NOTE — Patient Instructions (Signed)
[facility]  Discharge Instructions: ?Thank you for choosing [facility] to provide your oncology and hematology care.  ?If you have a lab appointment with the [facility], please come in thru the Main Entrance and check in at the main information desk. ? ?Wear comfortable clothing and clothing appropriate for easy access to any Portacath or PICC line.  ? ?We strive to give you quality time with your provider. You may need to reschedule your appointment if you arrive late (15 or more minutes).  Arriving late affects you and other patients whose appointments are after yours.  Also, if you miss three or more appointments without notifying the office, you may be dismissed from the clinic at the provider?s discretion.    ?  ?For prescription refill requests, have your pharmacy contact our office and allow 72 hours for refills to be completed.   ? ?Today you received the following chemotherapy and/or immunotherapy agents velcade.  ?Bortezomib injection ?What is this medication? ?BORTEZOMIB (bor TEZ oh mib) targets proteins in cancer cells and stops the cancer cells from growing. It treats multiple myeloma and mantle cell lymphoma. ?This medicine may be used for other purposes; ask your health care provider or pharmacist if you have questions. ?COMMON BRAND NAME(S): Velcade ?What should I tell my care team before I take this medication? ?They need to know if you have any of these conditions: ?dehydration ?diabetes (high blood sugar) ?heart disease ?liver disease ?tingling of the fingers or toes or other nerve disorder ?an unusual or allergic reaction to bortezomib, mannitol, boron, other medicines, foods, dyes, or preservatives ?pregnant or trying to get pregnant ?breast-feeding ?How should I use this medication? ?This medicine is injected into a vein or under the skin. It is given by a health care provider in a hospital or clinic setting. ?Talk to your health care provider about the use of  this medicine in children. Special care may be needed. ?Overdosage: If you think you have taken too much of this medicine contact a poison control center or emergency room at once. ?NOTE: This medicine is only for you. Do not share this medicine with others. ?What if I miss a dose? ?Keep appointments for follow-up doses. It is important not to miss your dose. Call your health care provider if you are unable to keep an appointment. ?What may interact with this medication? ?This medicine may interact with the following medications: ?ketoconazole ?rifampin ?This list may not describe all possible interactions. Give your health care provider a list of all the medicines, herbs, non-prescription drugs, or dietary supplements you use. Also tell them if you smoke, drink alcohol, or use illegal drugs. Some items may interact with your medicine. ?What should I watch for while using this medication? ?Your condition will be monitored carefully while you are receiving this medicine. ?You may need blood work done while you are taking this medicine. ?You may get drowsy or dizzy. Do not drive, use machinery, or do anything that needs mental alertness until you know how this medicine affects you. Do not stand up or sit up quickly, especially if you are an older patient. This reduces the risk of dizzy or fainting spells ?This medicine may increase your risk of getting an infection. Call your health care provider for advice if you get a fever, chills, sore throat, or other symptoms of a cold or flu. Do not treat yourself. Try to avoid being around people who are sick. ?Check with your health care provider  if you have severe diarrhea, nausea, and vomiting, or if you sweat a lot. The loss of too much body fluid may make it dangerous for you to take this medicine. ?Do not become pregnant while taking this medicine or for 7 months after stopping it. Women should inform their health care provider if they wish to become pregnant or think  they might be pregnant. Men should not father a child while taking this medicine and for 4 months after stopping it. There is a potential for serious harm to an unborn child. Talk to your health care provider for more information. Do not breast-feed an infant while taking this medicine or for 2 months after stopping it. ?This medicine may make it more difficult to get pregnant or father a child. Talk to your health care provider if you are concerned about your fertility. ?What side effects may I notice from receiving this medication? ?Side effects that you should report to your doctor or health care professional as soon as possible: ?allergic reactions (skin rash; itching or hives; swelling of the face, lips, or tongue) ?bleeding (bloody or black, tarry stools; red or dark brown urine; spitting up blood or brown material that looks like coffee grounds; red spots on the skin; unusual bruising or bleeding from the eye, gums, or nose) ?blurred vision or changes in vision ?confusion ?constipation ?headache ?heart failure (trouble breathing; fast, irregular heartbeat; sudden weight gain; swelling of the ankles, feet, hands) ?infection (fever, chills, cough, sore throat, pain or trouble passing urine) ?lack or loss of appetite ?liver injury (dark yellow or brown urine; general ill feeling or flu-like symptoms; loss of appetite, right upper belly pain; yellowing of the eyes or skin) ?low blood pressure (dizziness; feeling faint or lightheaded, falls; unusually weak or tired) ?muscle cramps ?pain, redness, or irritation at site where injected ?pain, tingling, numbness in the hands or feet ?seizures ?trouble breathing ?unusual bruising or bleeding ?Side effects that usually do not require medical attention (report to your doctor or health care professional if they continue or are bothersome): ?diarrhea ?nausea ?stomach pain ?trouble sleeping ?vomiting ?This list may not describe all possible side effects. Call your doctor  for medical advice about side effects. You may report side effects to FDA at 1-800-FDA-1088. ?Where should I keep my medication? ?This medicine is given in a hospital or clinic. It will not be stored at home. ?NOTE: This sheet is a summary. It may not cover all possible information. If you have questions about this medicine, talk to your doctor, pharmacist, or health care provider. ?? 2023 Elsevier/Gold Standard (2020-11-22 00:00:00) ?    ?  ?To help prevent nausea and vomiting after your treatment, we encourage you to take your nausea medication as directed. ? ?BELOW ARE SYMPTOMS THAT SHOULD BE REPORTED IMMEDIATELY: ?*FEVER GREATER THAN 100.4 F (38 ?C) OR HIGHER ?*CHILLS OR SWEATING ?*NAUSEA AND VOMITING THAT IS NOT CONTROLLED WITH YOUR NAUSEA MEDICATION ?*UNUSUAL SHORTNESS OF BREATH ?*UNUSUAL BRUISING OR BLEEDING ?*URINARY PROBLEMS (pain or burning when urinating, or frequent urination) ?*BOWEL PROBLEMS (unusual diarrhea, constipation, pain near the anus) ?TENDERNESS IN MOUTH AND THROAT WITH OR WITHOUT PRESENCE OF ULCERS (sore throat, sores in mouth, or a toothache) ?UNUSUAL RASH, SWELLING OR PAIN  ?UNUSUAL VAGINAL DISCHARGE OR ITCHING  ? ?Items with * indicate a potential emergency and should be followed up as soon as possible or go to the Emergency Department if any problems should occur. ? ?Please show the CHEMOTHERAPY ALERT CARD or IMMUNOTHERAPY ALERT CARD at check-in  to the Emergency Department and triage nurse. ? ?Should you have questions after your visit or need to cancel or reschedule your appointment, please contact [facility] [phone]  and follow the prompts.  Office hours are 8:00 a.m. to 4:30 p.m. Monday - Friday. Please note that voicemails left after 4:00 p.m. may not be returned until the following business day.  We are closed weekends and major holidays. You have access to a nurse at all times for urgent questions. Please call the main number to the clinic [phone] and follow  the prompts. ? ?For any non-urgent questions, you may also contact your provider using MyChart. We now offer e-Visits for anyone 32 and older to request care online for non-urgent symptoms. For details vis

## 2022-05-13 ENCOUNTER — Encounter: Payer: Self-pay | Admitting: Adult Health

## 2022-05-13 ENCOUNTER — Inpatient Hospital Stay (HOSPITAL_COMMUNITY): Payer: Medicare PPO

## 2022-05-13 ENCOUNTER — Non-Acute Institutional Stay (SKILLED_NURSING_FACILITY): Payer: Medicare PPO | Admitting: Adult Health

## 2022-05-13 VITALS — BP 137/86 | HR 78 | Temp 98.4°F | Resp 16 | Ht 69.0 in | Wt 118.0 lb

## 2022-05-13 DIAGNOSIS — C9000 Multiple myeloma not having achieved remission: Secondary | ICD-10-CM

## 2022-05-13 DIAGNOSIS — N183 Chronic kidney disease, stage 3 unspecified: Secondary | ICD-10-CM | POA: Diagnosis not present

## 2022-05-13 DIAGNOSIS — M810 Age-related osteoporosis without current pathological fracture: Secondary | ICD-10-CM | POA: Diagnosis not present

## 2022-05-13 DIAGNOSIS — F0390 Unspecified dementia without behavioral disturbance: Secondary | ICD-10-CM | POA: Diagnosis not present

## 2022-05-13 DIAGNOSIS — I129 Hypertensive chronic kidney disease with stage 1 through stage 4 chronic kidney disease, or unspecified chronic kidney disease: Secondary | ICD-10-CM | POA: Diagnosis not present

## 2022-05-13 DIAGNOSIS — D696 Thrombocytopenia, unspecified: Secondary | ICD-10-CM

## 2022-05-13 DIAGNOSIS — E1122 Type 2 diabetes mellitus with diabetic chronic kidney disease: Secondary | ICD-10-CM | POA: Diagnosis not present

## 2022-05-13 DIAGNOSIS — R5383 Other fatigue: Secondary | ICD-10-CM | POA: Diagnosis not present

## 2022-05-13 DIAGNOSIS — N189 Chronic kidney disease, unspecified: Secondary | ICD-10-CM | POA: Diagnosis not present

## 2022-05-13 DIAGNOSIS — R8290 Unspecified abnormal findings in urine: Secondary | ICD-10-CM | POA: Diagnosis not present

## 2022-05-13 DIAGNOSIS — Z5111 Encounter for antineoplastic chemotherapy: Secondary | ICD-10-CM | POA: Diagnosis not present

## 2022-05-13 DIAGNOSIS — Z8572 Personal history of non-Hodgkin lymphomas: Secondary | ICD-10-CM | POA: Diagnosis not present

## 2022-05-13 LAB — CBC WITH DIFFERENTIAL/PLATELET
Abs Immature Granulocytes: 0.02 10*3/uL (ref 0.00–0.07)
Basophils Absolute: 0 10*3/uL (ref 0.0–0.1)
Basophils Relative: 1 %
Eosinophils Absolute: 0.1 10*3/uL (ref 0.0–0.5)
Eosinophils Relative: 3 %
HCT: 32.6 % — ABNORMAL LOW (ref 36.0–46.0)
Hemoglobin: 10.7 g/dL — ABNORMAL LOW (ref 12.0–15.0)
Immature Granulocytes: 0 %
Lymphocytes Relative: 16 %
Lymphs Abs: 0.8 10*3/uL (ref 0.7–4.0)
MCH: 32.3 pg (ref 26.0–34.0)
MCHC: 32.8 g/dL (ref 30.0–36.0)
MCV: 98.5 fL (ref 80.0–100.0)
Monocytes Absolute: 0.6 10*3/uL (ref 0.1–1.0)
Monocytes Relative: 12 %
Neutro Abs: 3.6 10*3/uL (ref 1.7–7.7)
Neutrophils Relative %: 68 %
Platelets: 148 10*3/uL — ABNORMAL LOW (ref 150–400)
RBC: 3.31 MIL/uL — ABNORMAL LOW (ref 3.87–5.11)
RDW: 15.9 % — ABNORMAL HIGH (ref 11.5–15.5)
WBC: 5.2 10*3/uL (ref 4.0–10.5)
nRBC: 0 % (ref 0.0–0.2)

## 2022-05-13 LAB — COMPREHENSIVE METABOLIC PANEL
ALT: 21 U/L (ref 0–44)
AST: 35 U/L (ref 15–41)
Albumin: 3.3 g/dL — ABNORMAL LOW (ref 3.5–5.0)
Alkaline Phosphatase: 64 U/L (ref 38–126)
Anion gap: 5 (ref 5–15)
BUN: 21 mg/dL (ref 8–23)
CO2: 26 mmol/L (ref 22–32)
Calcium: 9 mg/dL (ref 8.9–10.3)
Chloride: 110 mmol/L (ref 98–111)
Creatinine, Ser: 1.31 mg/dL — ABNORMAL HIGH (ref 0.44–1.00)
GFR, Estimated: 42 mL/min — ABNORMAL LOW (ref >60)
Glucose, Bld: 85 mg/dL (ref 70–99)
Potassium: 3.4 mmol/L — ABNORMAL LOW (ref 3.5–5.1)
Sodium: 141 mmol/L (ref 135–145)
Total Bilirubin: 0.4 mg/dL (ref 0.3–1.2)
Total Protein: 6.5 g/dL (ref 6.5–8.1)

## 2022-05-13 LAB — MAGNESIUM: Magnesium: 2.3 mg/dL (ref 1.7–2.4)

## 2022-05-13 MED ORDER — DEXAMETHASONE 4 MG PO TABS
10.0000 mg | ORAL_TABLET | Freq: Once | ORAL | Status: AC
  Administered 2022-05-13: 10 mg via ORAL
  Filled 2022-05-13: qty 3

## 2022-05-13 MED ORDER — BORTEZOMIB CHEMO SQ INJECTION 3.5 MG (2.5MG/ML)
1.3000 mg/m2 | Freq: Once | INTRAMUSCULAR | Status: AC
  Administered 2022-05-13: 2 mg via SUBCUTANEOUS
  Filled 2022-05-13: qty 0.8

## 2022-05-13 NOTE — Progress Notes (Unsigned)
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[place])    Dementia ([place])    Diabetes mellitus with CKD    H/O partial nephrectomy    Hypokalemia    Hypothyroidism    Impaired cognition    Multiple myeloma ([place])    Dr Maylon Peppers, [facility]   Thyroid disease     Past Surgical History:  Procedure Laterality Date   ABDOMINAL HYSTERECTOMY     CRANIOTOMY     for lymphoma   YAG LASER APPLICATION Right 7/47/3403   Procedure: YAG LASER APPLICATION;  Surgeon: [name], MD;  Location: [facility];  Service: Ophthalmology;  Laterality: Right;    Social History   Socioeconomic History   Marital status: Single    Spouse name: Not on file   Number of children: Not on file   Years of education: Not on file   Highest education level: Not on file  Occupational History   Occupation: retired   Tobacco Use   Smoking status: Never   Smokeless tobacco: Never  Vaping Use   Vaping Use: Never used  Substance and Sexual Activity   Alcohol use: No   Drug use: No   Sexual activity: Never  Other Topics Concern   Not on file  Social History Narrative   [facility] resident of [facility]    Social Determinants of Health   Financial Resource Strain: Not on file  Food Insecurity: Not on file  Transportation Needs: Not on file  Physical Activity: Not on file  Stress: Not on file  Social Connections: Not on file  Intimate Partner Violence: Not on file   Family History  Problem Relation Age of Onset   Depression Mother    Diabetes Mother    Heart disease Father    Cancer - Prostate Brother     Cancer Brother    Cancer Sister       VITAL SIGNS There were no vitals taken for this visit.  Facility-Administered Encounter Medications as of 05/13/2022  Medication   cyanocobalamin ((VITAMIN B-12)) 1000 MCG/ML injection   cyanocobalamin ((VITAMIN B-12)) 1000 MCG/ML injection   denosumab (XGEVA) 120 MG/1.7ML injection   dexamethasone (DECADRON) 4 MG tablet   dexamethasone (DECADRON) 4 MG tablet   diphenhydrAMINE (BENADRYL) 50 MG/ML injection   diphenhydrAMINE (BENADRYL) 50 MG/ML injection   diphenhydrAMINE (BENADRYL) 50 MG/ML injection   epoetin alfa-epbx (RETACRIT) 70964 UNIT/ML injection   epoetin alfa-epbx (RETACRIT) 38381 UNIT/ML injection   epoetin alfa-epbx (RETACRIT) 84037 UNIT/ML injection   fulvestrant (FASLODEX) 250 MG/5ML injection   lanreotide acetate (SOMATULINE DEPOT) 120 MG/0.5ML injection   magnesium sulfate 2 GM/50ML IVPB   octreotide (SANDOSTATIN LAR) 30 MG IM injection   palonosetron (ALOXI) 0.25 MG/5ML injection   palonosetron (ALOXI) 0.25 MG/5ML injection   palonosetron (ALOXI) 0.25 MG/5ML injection   palonosetron (ALOXI) 0.25 MG/5ML injection   potassium chloride 10 MEQ/100ML IVPB   potassium chloride SA (KLOR-CON M) 20 MEQ CR tablet   potassium chloride SA (KLOR-CON M) 20 MEQ  CR tablet   prochlorperazine (COMPAZINE) 10 MG tablet   prochlorperazine (COMPAZINE) 10 MG tablet   prochlorperazine (COMPAZINE) tablet 10 mg   Outpatient Encounter Medications as of 05/13/2022  Medication Sig   acetaminophen (TYLENOL) 500 MG tablet Take 1,000 mg by mouth every 6 (six) hours as needed for mild pain.   acyclovir (ZOVIRAX) 400 MG tablet TAKE 1 TABLET BY MOUTH TWICE DAILY   aspirin EC 81 MG tablet Take 81 mg by mouth daily.   busPIRone (BUSPAR) 10 MG tablet Take by mouth 2 (two) times daily. For anxiety   Calcium Carbonate-Vit D-Min (CALCIUM 600+D PLUS MINERALS) 600-400 MG-UNIT CHEW Chew 1 tablet by mouth daily.   cholestyramine (QUESTRAN) 4 g packet Take 4 g by  mouth 2 (two) times daily between meals.   Lactobacillus Probiotic TABS Take 2 tablets by mouth daily.   levothyroxine (SYNTHROID, LEVOTHROID) 25 MCG tablet Take 25 mcg by mouth daily before breakfast.   lipase/protease/amylase (CREON) 36000 UNITS CPEP capsule 36,000-114,000- 180,000 unit; amt: 2; oral Special Instructions: Take 2 capsules with meals With Meals   lipase/protease/amylase (CREON) 36000 UNITS CPEP capsule 36,000-114,000- 180,000 unit; amt: 1; oral Special Instructions: Take one capsule with snacks if eaten between meals. Twice A Day Between Meals as needed   loperamide (IMODIUM A-D) 2 MG tablet Take 2 mg by mouth 3 (three) times daily. For diarrhea   losartan (COZAAR) 50 MG tablet Take 1 tablet (50 mg total) by mouth daily.   NON FORMULARY Regular diet no dairy products.   NON FORMULARY Wanderguard #2237 to ankle for safety awareness. Check placement and function qshift. Special Instructions: Check placement and function qshift. Every Shift Day, Evening, Night   potassium chloride SA (KLOR-CON) 20 MEQ tablet Take 20 mEq by mouth daily.    sertraline (ZOLOFT) 25 MG tablet Take 25 mg by mouth daily.     SIGNIFICANT DIAGNOSTIC EXAMS       ASSESSMENT/ PLAN:     Ok Edwards NP [facility]  Contact [phone] Monday through Friday 8am- 5pm  After hours call [phone]

## 2022-05-13 NOTE — Progress Notes (Signed)
Patient tolerated Velcade injection with no complaints voiced. Lab work reviewed. See MAR for details. Injection site clean and dry with no bruising or swelling noted. Patient stable during and after injection. Band aid applied. VSS. Patient left in satisfactory condition with no s/s of distress noted. 

## 2022-05-13 NOTE — Patient Instructions (Signed)
[facility]  Discharge Instructions: Thank you for choosing [facility] to provide your oncology and hematology care.  If you have a lab appointment with the [facility], please come in thru the Main Entrance and check in at the main information desk.  Wear comfortable clothing and clothing appropriate for easy access to any Portacath or PICC line.   We strive to give you quality time with your provider. You may need to reschedule your appointment if you arrive late (15 or more minutes).  Arriving late affects you and other patients whose appointments are after yours.  Also, if you miss three or more appointments without notifying the office, you may be dismissed from the clinic at the provider's discretion.      For prescription refill requests, have your pharmacy contact our office and allow 72 hours for refills to be completed.    Today you received the following chemotherapy and/or immunotherapy agents Velcade, return as scheduled.    To help prevent nausea and vomiting after your treatment, we encourage you to take your nausea medication as directed.  BELOW ARE SYMPTOMS THAT SHOULD BE REPORTED IMMEDIATELY: *FEVER GREATER THAN 100.4 F (38 C) OR HIGHER *CHILLS OR SWEATING *NAUSEA AND VOMITING THAT IS NOT CONTROLLED WITH YOUR NAUSEA MEDICATION *UNUSUAL SHORTNESS OF BREATH *UNUSUAL BRUISING OR BLEEDING *URINARY PROBLEMS (pain or burning when urinating, or frequent urination) *BOWEL PROBLEMS (unusual diarrhea, constipation, pain near the anus) TENDERNESS IN MOUTH AND THROAT WITH OR WITHOUT PRESENCE OF ULCERS (sore throat, sores in mouth, or a toothache) UNUSUAL RASH, SWELLING OR PAIN  UNUSUAL VAGINAL DISCHARGE OR ITCHING   Items with * indicate a potential emergency and should be followed up as soon as possible or go to the Emergency Department if any problems should occur.  Please show the CHEMOTHERAPY ALERT CARD or IMMUNOTHERAPY ALERT CARD at check-in to  the Emergency Department and triage nurse.  Should you have questions after your visit or need to cancel or reschedule your appointment, please contact [facility] [phone]  and follow the prompts.  Office hours are 8:00 a.m. to 4:30 p.m. Monday - Friday. Please note that voicemails left after 4:00 p.m. may not be returned until the following business day.  We are closed weekends and major holidays. You have access to a nurse at all times for urgent questions. Please call the main number to the clinic [phone] and follow the prompts.  For any non-urgent questions, you may also contact your provider using MyChart. We now offer e-Visits for anyone 34 and older to request care online for non-urgent symptoms. For details visit [email or web address].   Also download the MyChart app! Go to the app store, search "MyChart", open the app, select [facility], and log in with your MyChart username and password.  Due to Covid, a mask is required upon entering the hospital/clinic. If you do not have a mask, one will be given to you upon arrival. For doctor visits, patients may have 1 support person aged 68 or older with them. For treatment visits, patients cannot have anyone with them due to current Covid guidelines and our immunocompromised population.

## 2022-05-15 ENCOUNTER — Other Ambulatory Visit (HOSPITAL_COMMUNITY)
Admission: RE | Admit: 2022-05-15 | Discharge: 2022-05-15 | Disposition: A | Discharge status: Home / Self Care | Payer: Medicare PPO | Source: Skilled Nursing Facility | Attending: Adult Health | Admitting: Adult Health

## 2022-05-15 DIAGNOSIS — E1159 Type 2 diabetes mellitus with other circulatory complications: Secondary | ICD-10-CM | POA: Insufficient documentation

## 2022-05-15 LAB — HEMOGLOBIN A1C
Hgb A1c MFr Bld: 5.4 % (ref 4.8–5.6)
Mean Plasma Glucose: 108.28 mg/dL

## 2022-05-20 ENCOUNTER — Inpatient Hospital Stay (HOSPITAL_COMMUNITY): Payer: Medicare PPO

## 2022-05-20 ENCOUNTER — Inpatient Hospital Stay (HOSPITAL_COMMUNITY): Payer: Medicare PPO | Attending: Hematology

## 2022-05-20 VITALS — BP 148/93 | HR 75 | Temp 97.6°F | Resp 16

## 2022-05-20 DIAGNOSIS — R197 Diarrhea, unspecified: Secondary | ICD-10-CM | POA: Diagnosis not present

## 2022-05-20 DIAGNOSIS — F0390 Unspecified dementia without behavioral disturbance: Secondary | ICD-10-CM | POA: Diagnosis not present

## 2022-05-20 DIAGNOSIS — Z5111 Encounter for antineoplastic chemotherapy: Secondary | ICD-10-CM | POA: Insufficient documentation

## 2022-05-20 DIAGNOSIS — C9000 Multiple myeloma not having achieved remission: Secondary | ICD-10-CM | POA: Diagnosis not present

## 2022-05-20 DIAGNOSIS — Z8572 Personal history of non-Hodgkin lymphomas: Secondary | ICD-10-CM | POA: Insufficient documentation

## 2022-05-20 DIAGNOSIS — Z923 Personal history of irradiation: Secondary | ICD-10-CM | POA: Insufficient documentation

## 2022-05-20 DIAGNOSIS — N189 Chronic kidney disease, unspecified: Secondary | ICD-10-CM | POA: Diagnosis not present

## 2022-05-20 LAB — CBC WITH DIFFERENTIAL/PLATELET
Abs Immature Granulocytes: 0.01 10*3/uL (ref 0.00–0.07)
Basophils Absolute: 0 10*3/uL (ref 0.0–0.1)
Basophils Relative: 0 %
Eosinophils Absolute: 0.1 10*3/uL (ref 0.0–0.5)
Eosinophils Relative: 3 %
HCT: 35.6 % — ABNORMAL LOW (ref 36.0–46.0)
Hemoglobin: 11.6 g/dL — ABNORMAL LOW (ref 12.0–15.0)
Immature Granulocytes: 0 %
Lymphocytes Relative: 13 %
Lymphs Abs: 0.6 10*3/uL — ABNORMAL LOW (ref 0.7–4.0)
MCH: 31.9 pg (ref 26.0–34.0)
MCHC: 32.6 g/dL (ref 30.0–36.0)
MCV: 97.8 fL (ref 80.0–100.0)
Monocytes Absolute: 0.6 10*3/uL (ref 0.1–1.0)
Monocytes Relative: 14 %
Neutro Abs: 3.1 10*3/uL (ref 1.7–7.7)
Neutrophils Relative %: 70 %
Platelets: 124 10*3/uL — ABNORMAL LOW (ref 150–400)
RBC: 3.64 MIL/uL — ABNORMAL LOW (ref 3.87–5.11)
RDW: 15.9 % — ABNORMAL HIGH (ref 11.5–15.5)
WBC: 4.5 10*3/uL (ref 4.0–10.5)
nRBC: 0 % (ref 0.0–0.2)

## 2022-05-20 LAB — COMPREHENSIVE METABOLIC PANEL
ALT: 22 U/L (ref 0–44)
AST: 29 U/L (ref 15–41)
Albumin: 3.6 g/dL (ref 3.5–5.0)
Alkaline Phosphatase: 62 U/L (ref 38–126)
Anion gap: 9 (ref 5–15)
BUN: 24 mg/dL — ABNORMAL HIGH (ref 8–23)
CO2: 27 mmol/L (ref 22–32)
Calcium: 9.2 mg/dL (ref 8.9–10.3)
Chloride: 107 mmol/L (ref 98–111)
Creatinine, Ser: 1.08 mg/dL — ABNORMAL HIGH (ref 0.44–1.00)
GFR, Estimated: 53 mL/min — ABNORMAL LOW (ref >60)
Glucose, Bld: 69 mg/dL — ABNORMAL LOW (ref 70–99)
Potassium: 3.9 mmol/L (ref 3.5–5.1)
Sodium: 143 mmol/L (ref 135–145)
Total Bilirubin: 0.5 mg/dL (ref 0.3–1.2)
Total Protein: 6.8 g/dL (ref 6.5–8.1)

## 2022-05-20 LAB — MAGNESIUM: Magnesium: 2.5 mg/dL — ABNORMAL HIGH (ref 1.7–2.4)

## 2022-05-20 MED ORDER — DEXAMETHASONE 4 MG PO TABS
10.0000 mg | ORAL_TABLET | Freq: Once | ORAL | Status: AC
  Administered 2022-05-20: 10 mg via ORAL
  Filled 2022-05-20: qty 3

## 2022-05-20 MED ORDER — BORTEZOMIB CHEMO SQ INJECTION 3.5 MG (2.5MG/ML)
1.3000 mg/m2 | Freq: Once | INTRAMUSCULAR | Status: AC
  Administered 2022-05-20: 2 mg via SUBCUTANEOUS
  Filled 2022-05-20: qty 0.8

## 2022-05-20 NOTE — Progress Notes (Signed)
Labs reviewed and ok to treat per parameters

## 2022-05-20 NOTE — Progress Notes (Signed)
Amanda Wu presents today for Velcade injection per the provider's orders.  Stable during administration without incident; injection site WNL; see MAR for injection details.  Patient tolerated procedure well and without incident.  No questions or complaints noted at this time. Discharge from clinic via wheelchair in stable condition.  Alert and oriented X 3.  Follow up with [facility] as scheduled.

## 2022-05-20 NOTE — Patient Instructions (Signed)
[facility]  Discharge Instructions: Thank you for choosing [facility] to provide your oncology and hematology care.  If you have a lab appointment with the [facility], please come in thru the Main Entrance and check in at the main information desk.  Wear comfortable clothing and clothing appropriate for easy access to any Portacath or PICC line.   We strive to give you quality time with your provider. You may need to reschedule your appointment if you arrive late (15 or more minutes).  Arriving late affects you and other patients whose appointments are after yours.  Also, if you miss three or more appointments without notifying the office, you may be dismissed from the clinic at the provider's discretion.      For prescription refill requests, have your pharmacy contact our office and allow 72 hours for refills to be completed.    Today you received the following chemotherapy and/or immunotherapy agents Velcade      To help prevent nausea and vomiting after your treatment, we encourage you to take your nausea medication as directed.  BELOW ARE SYMPTOMS THAT SHOULD BE REPORTED IMMEDIATELY: *FEVER GREATER THAN 100.4 F (38 C) OR HIGHER *CHILLS OR SWEATING *NAUSEA AND VOMITING THAT IS NOT CONTROLLED WITH YOUR NAUSEA MEDICATION *UNUSUAL SHORTNESS OF BREATH *UNUSUAL BRUISING OR BLEEDING *URINARY PROBLEMS (pain or burning when urinating, or frequent urination) *BOWEL PROBLEMS (unusual diarrhea, constipation, pain near the anus) TENDERNESS IN MOUTH AND THROAT WITH OR WITHOUT PRESENCE OF ULCERS (sore throat, sores in mouth, or a toothache) UNUSUAL RASH, SWELLING OR PAIN  UNUSUAL VAGINAL DISCHARGE OR ITCHING   Items with * indicate a potential emergency and should be followed up as soon as possible or go to the Emergency Department if any problems should occur.  Please show the CHEMOTHERAPY ALERT CARD or IMMUNOTHERAPY ALERT CARD at check-in to the Emergency  Department and triage nurse.  Should you have questions after your visit or need to cancel or reschedule your appointment, please contact [facility] [phone]  and follow the prompts.  Office hours are 8:00 a.m. to 4:30 p.m. Monday - Friday. Please note that voicemails left after 4:00 p.m. may not be returned until the following business day.  We are closed weekends and major holidays. You have access to a nurse at all times for urgent questions. Please call the main number to the clinic [phone] and follow the prompts.  For any non-urgent questions, you may also contact your provider using MyChart. We now offer e-Visits for anyone 78 and older to request care online for non-urgent symptoms. For details visit [email or web address].   Also download the MyChart app! Go to the app store, search "MyChart", open the app, select [facility], and log in with your MyChart username and password.  Due to Covid, a mask is required upon entering the hospital/clinic. If you do not have a mask, one will be given to you upon arrival. For doctor visits, patients may have 1 support person aged 78 or older with them. For treatment visits, patients cannot have anyone with them due to current Covid guidelines and our immunocompromised population.

## 2022-05-27 ENCOUNTER — Inpatient Hospital Stay (HOSPITAL_COMMUNITY): Payer: Medicare PPO

## 2022-05-27 VITALS — BP 137/86 | HR 83 | Temp 98.1°F | Resp 18 | Wt 116.0 lb

## 2022-05-27 DIAGNOSIS — Z5111 Encounter for antineoplastic chemotherapy: Secondary | ICD-10-CM | POA: Diagnosis not present

## 2022-05-27 DIAGNOSIS — C9000 Multiple myeloma not having achieved remission: Secondary | ICD-10-CM

## 2022-05-27 DIAGNOSIS — F0390 Unspecified dementia without behavioral disturbance: Secondary | ICD-10-CM | POA: Diagnosis not present

## 2022-05-27 DIAGNOSIS — R197 Diarrhea, unspecified: Secondary | ICD-10-CM | POA: Diagnosis not present

## 2022-05-27 DIAGNOSIS — Z923 Personal history of irradiation: Secondary | ICD-10-CM | POA: Diagnosis not present

## 2022-05-27 DIAGNOSIS — Z8572 Personal history of non-Hodgkin lymphomas: Secondary | ICD-10-CM | POA: Diagnosis not present

## 2022-05-27 DIAGNOSIS — N189 Chronic kidney disease, unspecified: Secondary | ICD-10-CM | POA: Diagnosis not present

## 2022-05-27 LAB — CBC WITH DIFFERENTIAL/PLATELET
Abs Immature Granulocytes: 0.01 10*3/uL (ref 0.00–0.07)
Basophils Absolute: 0 10*3/uL (ref 0.0–0.1)
Basophils Relative: 0 %
Eosinophils Absolute: 0.1 10*3/uL (ref 0.0–0.5)
Eosinophils Relative: 2 %
HCT: 34.2 % — ABNORMAL LOW (ref 36.0–46.0)
Hemoglobin: 11.3 g/dL — ABNORMAL LOW (ref 12.0–15.0)
Immature Granulocytes: 0 %
Lymphocytes Relative: 12 %
Lymphs Abs: 0.7 10*3/uL (ref 0.7–4.0)
MCH: 32.2 pg (ref 26.0–34.0)
MCHC: 33 g/dL (ref 30.0–36.0)
MCV: 97.4 fL (ref 80.0–100.0)
Monocytes Absolute: 0.5 10*3/uL (ref 0.1–1.0)
Monocytes Relative: 8 %
Neutro Abs: 4.4 10*3/uL (ref 1.7–7.7)
Neutrophils Relative %: 78 %
Platelets: 115 10*3/uL — ABNORMAL LOW (ref 150–400)
RBC: 3.51 MIL/uL — ABNORMAL LOW (ref 3.87–5.11)
RDW: 15.9 % — ABNORMAL HIGH (ref 11.5–15.5)
WBC: 5.7 10*3/uL (ref 4.0–10.5)
nRBC: 0 % (ref 0.0–0.2)

## 2022-05-27 LAB — COMPREHENSIVE METABOLIC PANEL
ALT: 21 U/L (ref 0–44)
AST: 30 U/L (ref 15–41)
Albumin: 3.3 g/dL — ABNORMAL LOW (ref 3.5–5.0)
Alkaline Phosphatase: 62 U/L (ref 38–126)
Anion gap: 6 (ref 5–15)
BUN: 23 mg/dL (ref 8–23)
CO2: 24 mmol/L (ref 22–32)
Calcium: 8.8 mg/dL — ABNORMAL LOW (ref 8.9–10.3)
Chloride: 113 mmol/L — ABNORMAL HIGH (ref 98–111)
Creatinine, Ser: 1.28 mg/dL — ABNORMAL HIGH (ref 0.44–1.00)
GFR, Estimated: 43 mL/min — ABNORMAL LOW (ref >60)
Glucose, Bld: 124 mg/dL — ABNORMAL HIGH (ref 70–99)
Potassium: 3.6 mmol/L (ref 3.5–5.1)
Sodium: 143 mmol/L (ref 135–145)
Total Bilirubin: 0.4 mg/dL (ref 0.3–1.2)
Total Protein: 6.5 g/dL (ref 6.5–8.1)

## 2022-05-27 LAB — LACTATE DEHYDROGENASE: LDH: 211 U/L — ABNORMAL HIGH (ref 98–192)

## 2022-05-27 LAB — MAGNESIUM: Magnesium: 2.2 mg/dL (ref 1.7–2.4)

## 2022-05-27 MED ORDER — DEXAMETHASONE 4 MG PO TABS
10.0000 mg | ORAL_TABLET | Freq: Once | ORAL | Status: AC
  Administered 2022-05-27: 10 mg via ORAL
  Filled 2022-05-27: qty 3

## 2022-05-27 MED ORDER — BORTEZOMIB CHEMO SQ INJECTION 3.5 MG (2.5MG/ML)
1.3000 mg/m2 | Freq: Once | INTRAMUSCULAR | Status: AC
  Administered 2022-05-27: 2 mg via SUBCUTANEOUS
  Filled 2022-05-27: qty 0.8

## 2022-05-27 NOTE — Progress Notes (Signed)
Pt presents today for Velcade injection  per provider's order. Labs and vital signs WNL for treatment. Okay to proceed with treatment today.  Velcade injection  given today per MD orders. Tolerated infusion without adverse affects. Vital signs stable. No complaints at this time. Discharged from clinic via wheelchair in stable condition. Alert and oriented x 3. F/U with [facility] as scheduled.

## 2022-05-27 NOTE — Patient Instructions (Signed)
[facility]  Discharge Instructions: Thank you for choosing [facility] to provide your oncology and hematology care.  If you have a lab appointment with the [facility], please come in thru the Main Entrance and check in at the main information desk.  Wear comfortable clothing and clothing appropriate for easy access to any Portacath or PICC line.   We strive to give you quality time with your provider. You may need to reschedule your appointment if you arrive late (15 or more minutes).  Arriving late affects you and other patients whose appointments are after yours.  Also, if you miss three or more appointments without notifying the office, you may be dismissed from the clinic at the provider's discretion.      For prescription refill requests, have your pharmacy contact our office and allow 72 hours for refills to be completed.    Today you received the following chemotherapy and/or immunotherapy agents Velcade   To help prevent nausea and vomiting after your treatment, we encourage you to take your nausea medication as directed.  BELOW ARE SYMPTOMS THAT SHOULD BE REPORTED IMMEDIATELY: *FEVER GREATER THAN 100.4 F (38 C) OR HIGHER *CHILLS OR SWEATING *NAUSEA AND VOMITING THAT IS NOT CONTROLLED WITH YOUR NAUSEA MEDICATION *UNUSUAL SHORTNESS OF BREATH *UNUSUAL BRUISING OR BLEEDING *URINARY PROBLEMS (pain or burning when urinating, or frequent urination) *BOWEL PROBLEMS (unusual diarrhea, constipation, pain near the anus) TENDERNESS IN MOUTH AND THROAT WITH OR WITHOUT PRESENCE OF ULCERS (sore throat, sores in mouth, or a toothache) UNUSUAL RASH, SWELLING OR PAIN  UNUSUAL VAGINAL DISCHARGE OR ITCHING   Items with * indicate a potential emergency and should be followed up as soon as possible or go to the Emergency Department if any problems should occur.  Please show the CHEMOTHERAPY ALERT CARD or IMMUNOTHERAPY ALERT CARD at check-in to the Emergency Department  and triage nurse.  Should you have questions after your visit or need to cancel or reschedule your appointment, please contact [facility] [phone]  and follow the prompts.  Office hours are 8:00 a.m. to 4:30 p.m. Monday - Friday. Please note that voicemails left after 4:00 p.m. may not be returned until the following business day.  We are closed weekends and major holidays. You have access to a nurse at all times for urgent questions. Please call the main number to the clinic [phone] and follow the prompts.  For any non-urgent questions, you may also contact your provider using MyChart. We now offer e-Visits for anyone 31 and older to request care online for non-urgent symptoms. For details visit [email or web address].   Also download the MyChart app! Go to the app store, search "MyChart", open the app, select [facility], and log in with your MyChart username and password.  Masks are optional in the cancer centers. If you would like for your care team to wear a mask while they are taking care of you, please let them know. For doctor visits, patients may have with them one support person who is at least 79 years old. At this time, visitors are not allowed in the infusion area.  Bortezomib injection What is this medication? BORTEZOMIB (bor TEZ oh mib) targets proteins in cancer cells and stops the cancer cells from growing. It treats multiple myeloma and mantle cell lymphoma. This medicine may be used for other purposes; ask your health care provider or pharmacist if you have questions. COMMON BRAND NAME(S): Velcade What should I tell my care team before  I take this medication? They need to know if you have any of these conditions: dehydration diabetes (high blood sugar) heart disease liver disease tingling of the fingers or toes or other nerve disorder an unusual or allergic reaction to bortezomib, mannitol, boron, other medicines, foods, dyes, or preservatives pregnant  or trying to get pregnant breast-feeding How should I use this medication? This medicine is injected into a vein or under the skin. It is given by a health care provider in a hospital or clinic setting. Talk to your health care provider about the use of this medicine in children. Special care may be needed. Overdosage: If you think you have taken too much of this medicine contact a poison control center or emergency room at once. NOTE: This medicine is only for you. Do not share this medicine with others. What if I miss a dose? Keep appointments for follow-up doses. It is important not to miss your dose. Call your health care provider if you are unable to keep an appointment. What may interact with this medication? This medicine may interact with the following medications: ketoconazole rifampin This list may not describe all possible interactions. Give your health care provider a list of all the medicines, herbs, non-prescription drugs, or dietary supplements you use. Also tell them if you smoke, drink alcohol, or use illegal drugs. Some items may interact with your medicine. What should I watch for while using this medication? Your condition will be monitored carefully while you are receiving this medicine. You may need blood work done while you are taking this medicine. You may get drowsy or dizzy. Do not drive, use machinery, or do anything that needs mental alertness until you know how this medicine affects you. Do not stand up or sit up quickly, especially if you are an older patient. This reduces the risk of dizzy or fainting spells This medicine may increase your risk of getting an infection. Call your health care provider for advice if you get a fever, chills, sore throat, or other symptoms of a cold or flu. Do not treat yourself. Try to avoid being around people who are sick. Check with your health care provider if you have severe diarrhea, nausea, and vomiting, or if you sweat a lot. The  loss of too much body fluid may make it dangerous for you to take this medicine. Do not become pregnant while taking this medicine or for 7 months after stopping it. Women should inform their health care provider if they wish to become pregnant or think they might be pregnant. Men should not father a child while taking this medicine and for 4 months after stopping it. There is a potential for serious harm to an unborn child. Talk to your health care provider for more information. Do not breast-feed an infant while taking this medicine or for 2 months after stopping it. This medicine may make it more difficult to get pregnant or father a child. Talk to your health care provider if you are concerned about your fertility. What side effects may I notice from receiving this medication? Side effects that you should report to your doctor or health care professional as soon as possible: allergic reactions (skin rash; itching or hives; swelling of the face, lips, or tongue) bleeding (bloody or black, tarry stools; red or dark brown urine; spitting up blood or brown material that looks like coffee grounds; red spots on the skin; unusual bruising or bleeding from the eye, gums, or nose) blurred vision or changes  in vision confusion constipation headache heart failure (trouble breathing; fast, irregular heartbeat; sudden weight gain; swelling of the ankles, feet, hands) infection (fever, chills, cough, sore throat, pain or trouble passing urine) lack or loss of appetite liver injury (dark yellow or brown urine; general ill feeling or flu-like symptoms; loss of appetite, right upper belly pain; yellowing of the eyes or skin) low blood pressure (dizziness; feeling faint or lightheaded, falls; unusually weak or tired) muscle cramps pain, redness, or irritation at site where injected pain, tingling, numbness in the hands or feet seizures trouble breathing unusual bruising or bleeding Side effects that usually  do not require medical attention (report to your doctor or health care professional if they continue or are bothersome): diarrhea nausea stomach pain trouble sleeping vomiting This list may not describe all possible side effects. Call your doctor for medical advice about side effects. You may report side effects to FDA at 1-800-FDA-1088. Where should I keep my medication? This medicine is given in a hospital or clinic. It will not be stored at home. NOTE: This sheet is a summary. It may not cover all possible information. If you have questions about this medicine, talk to your doctor, pharmacist, or health care provider.  2023 Elsevier/Gold Standard (2020-11-22 00:00:00)

## 2022-05-28 ENCOUNTER — Encounter: Payer: Self-pay | Admitting: Internal Medicine

## 2022-05-28 ENCOUNTER — Non-Acute Institutional Stay (SKILLED_NURSING_FACILITY): Payer: Medicare PPO | Admitting: Internal Medicine

## 2022-05-28 DIAGNOSIS — I700 Atherosclerosis of aorta: Secondary | ICD-10-CM | POA: Diagnosis not present

## 2022-05-28 DIAGNOSIS — F01518 Vascular dementia, unspecified severity, with other behavioral disturbance: Secondary | ICD-10-CM | POA: Diagnosis not present

## 2022-05-28 DIAGNOSIS — E039 Hypothyroidism, unspecified: Secondary | ICD-10-CM

## 2022-05-28 DIAGNOSIS — N183 Chronic kidney disease, stage 3 unspecified: Secondary | ICD-10-CM

## 2022-05-28 DIAGNOSIS — E1122 Type 2 diabetes mellitus with diabetic chronic kidney disease: Secondary | ICD-10-CM | POA: Diagnosis not present

## 2022-05-28 DIAGNOSIS — K8689 Other specified diseases of pancreas: Secondary | ICD-10-CM | POA: Diagnosis not present

## 2022-05-28 DIAGNOSIS — I129 Hypertensive chronic kidney disease with stage 1 through stage 4 chronic kidney disease, or unspecified chronic kidney disease: Secondary | ICD-10-CM

## 2022-05-28 DIAGNOSIS — N1832 Chronic kidney disease, stage 3b: Secondary | ICD-10-CM

## 2022-05-28 LAB — KAPPA/LAMBDA LIGHT CHAINS
Kappa free light chain: 28.5 mg/L — ABNORMAL HIGH (ref 3.3–19.4)
Kappa, lambda light chain ratio: 1.09 (ref 0.26–1.65)
Lambda free light chains: 26.1 mg/L (ref 5.7–26.3)

## 2022-05-28 NOTE — Assessment & Plan Note (Signed)
Patient describes watery stool for 6 months.  Staff has not documented diarrhea on her present regimen.  No change indicated; continue to monitor.

## 2022-05-28 NOTE — Assessment & Plan Note (Signed)
Update TSH

## 2022-05-28 NOTE — Patient Instructions (Signed)
See assessment and plan under each diagnosis in the problem list and acutely for this visit 

## 2022-05-28 NOTE — Assessment & Plan Note (Signed)
She can provide no meaningful history and responses were nonsensical.

## 2022-05-28 NOTE — Assessment & Plan Note (Signed)
BP controlled; no change in antihypertensive medications  

## 2022-05-28 NOTE — Assessment & Plan Note (Signed)
Current A1c is prediabetic with a value of 5.4%.  No change indicated.

## 2022-05-28 NOTE — Assessment & Plan Note (Signed)
She denies any angina or anginal equivalent.

## 2022-05-28 NOTE — Progress Notes (Signed)
NURSING HOME LOCATION:  [facility] ROOM NUMBER:  42 W  CODE STATUS:  DNR  PCP:  Ok Edwards NP,PSC  This is a nursing facility follow up visit of chronic medical diagnoses & to document compliance with Regulation 483.30 (c) in The [place] Manual Phase 2 which mandates caregiver visit ( visits can alternate among physician, PA or NP as per statutes) within 10 days of 30 days / 60 days/ 90 days post admission to SNF date    Interim medical record and care since last SNF visit was updated with review of diagnostic studies and change in clinical status since last visit were documented.  HPI: She is a permanent resident of this facility with medical diagnoses of essential hypertension, lymphoma of the CNS, diabetes with CKD, hypothyroidism, multiple myeloma, and dementia. She received an infusion of chemotherapy 6/13 for the multiple myeloma.  Labs performed 6/13 revealed a glucose of 124, creatinine 1.28, albumin 3.3, total protein 6.5, and GFR 43.  Her CKD stage IIIb has been relatively stable.  GFR has ranged from a low of 37 up to a high of 53.  Serially anemia has been stable with current H/H of 11.3/34.2.  Current platelet count was 115,000 with a recent range of 86,000 up to 148,000.  LDH remains mildly elevated at 211.  Protein electrophoresis and kappa/lambda light chain evaluation is are in process.  Review of systems: Dementia invalidated responses.  Initially I asked her about her clinic visit yesterday and she pointed to her bed stating "this is my bed right here.".  She then stated that she had 3 sisters and 3 brothers.  She finally did validate going to the Oncology clinic yesterday but stated that she was told she was "doing fine" and received no treatment.  She did describe "a little diarrhea" as watery stool for 6 months.  Otherwise review of systems was totally negative. After I finished the interview, I I thanked her for talking with me and she  responded "thank you for making stuff up."  Physical exam:  Pertinent or positive findings: She is thin and appears suboptimally nourished.  She tends to exhibit a wide-eyed stare.  She has an upper plate and is missing multiple mandibular teeth.  She has minor rales on auscultation of the chest.  Pedal pulses are decreased.  She has an antiwandering bracelet at the right ankle.  Although strength to opposition is fair in all extremities and symmetric; she exhibits atrophy of the limbs.  General appearance: no acute distress, increased work of breathing is present.   Lymphatic: No lymphadenopathy about the head, neck, axilla. Eyes: No conjunctival inflammation or lid edema is present. There is no scleral icterus. Ears:  External ear exam shows no significant lesions or deformities.   Nose:  External nasal examination shows no deformity or inflammation. Nasal mucosa are pink and moist without lesions, exudates Neck:  No thyromegaly, masses, tenderness noted.    Heart:  Normal rate and regular rhythm. S1 and S2 normal without gallop, murmur, click, rub .  Lungs:  without wheezes, rhonchi, rubs. Abdomen: Bowel sounds are normal. Abdomen is soft and nontender with no organomegaly, hernias, masses. GU: Deferred  Extremities:  No cyanosis, clubbing, edema  Neurologic exam :Balance, Rhomberg, finger to nose testing could not be completed due to clinical state Skin: Warm & dry w/o tenting. No significant lesions or rash.  See summary under each active problem in the Problem List with associated updated  therapeutic plan

## 2022-05-29 LAB — PROTEIN ELECTROPHORESIS, SERUM
A/G Ratio: 1.2 (ref 0.7–1.7)
Albumin ELP: 3.3 g/dL (ref 2.9–4.4)
Alpha-1-Globulin: 0.2 g/dL (ref 0.0–0.4)
Alpha-2-Globulin: 0.6 g/dL (ref 0.4–1.0)
Beta Globulin: 0.8 g/dL (ref 0.7–1.3)
Gamma Globulin: 1.2 g/dL (ref 0.4–1.8)
Globulin, Total: 2.7 g/dL (ref 2.2–3.9)
M-Spike, %: 0.7 g/dL — ABNORMAL HIGH
Total Protein ELP: 6 g/dL (ref 6.0–8.5)

## 2022-06-09 ENCOUNTER — Other Ambulatory Visit (HOSPITAL_COMMUNITY)
Admission: RE | Admit: 2022-06-09 | Discharge: 2022-06-09 | Disposition: A | Discharge status: Home / Self Care | Payer: Medicare PPO | Source: Skilled Nursing Facility | Attending: Adult Health | Admitting: Adult Health

## 2022-06-09 ENCOUNTER — Inpatient Hospital Stay (HOSPITAL_BASED_OUTPATIENT_CLINIC_OR_DEPARTMENT_OTHER): Payer: Medicare PPO | Admitting: Hematology

## 2022-06-09 ENCOUNTER — Other Ambulatory Visit (HOSPITAL_COMMUNITY): Payer: Self-pay

## 2022-06-09 ENCOUNTER — Inpatient Hospital Stay (HOSPITAL_COMMUNITY): Payer: Medicare PPO

## 2022-06-09 VITALS — BP 148/76 | HR 75 | Temp 97.4°F | Resp 18 | Ht 69.0 in | Wt 118.4 lb

## 2022-06-09 DIAGNOSIS — Z5111 Encounter for antineoplastic chemotherapy: Secondary | ICD-10-CM | POA: Diagnosis not present

## 2022-06-09 DIAGNOSIS — C9000 Multiple myeloma not having achieved remission: Secondary | ICD-10-CM

## 2022-06-09 DIAGNOSIS — N189 Chronic kidney disease, unspecified: Secondary | ICD-10-CM | POA: Diagnosis not present

## 2022-06-09 DIAGNOSIS — E039 Hypothyroidism, unspecified: Secondary | ICD-10-CM | POA: Diagnosis present

## 2022-06-09 DIAGNOSIS — R4182 Altered mental status, unspecified: Secondary | ICD-10-CM | POA: Diagnosis not present

## 2022-06-09 DIAGNOSIS — Z8572 Personal history of non-Hodgkin lymphomas: Secondary | ICD-10-CM | POA: Diagnosis not present

## 2022-06-09 DIAGNOSIS — Z923 Personal history of irradiation: Secondary | ICD-10-CM | POA: Diagnosis not present

## 2022-06-09 DIAGNOSIS — R197 Diarrhea, unspecified: Secondary | ICD-10-CM | POA: Diagnosis not present

## 2022-06-09 DIAGNOSIS — F0390 Unspecified dementia without behavioral disturbance: Secondary | ICD-10-CM | POA: Diagnosis not present

## 2022-06-09 LAB — CBC WITH DIFFERENTIAL/PLATELET
Abs Immature Granulocytes: 0.02 10*3/uL (ref 0.00–0.07)
Basophils Absolute: 0 10*3/uL (ref 0.0–0.1)
Basophils Relative: 1 %
Eosinophils Absolute: 0.2 10*3/uL (ref 0.0–0.5)
Eosinophils Relative: 3 %
HCT: 32 % — ABNORMAL LOW (ref 36.0–46.0)
Hemoglobin: 10.3 g/dL — ABNORMAL LOW (ref 12.0–15.0)
Immature Granulocytes: 0 %
Lymphocytes Relative: 14 %
Lymphs Abs: 0.6 10*3/uL — ABNORMAL LOW (ref 0.7–4.0)
MCH: 31.4 pg (ref 26.0–34.0)
MCHC: 32.2 g/dL (ref 30.0–36.0)
MCV: 97.6 fL (ref 80.0–100.0)
Monocytes Absolute: 0.5 10*3/uL (ref 0.1–1.0)
Monocytes Relative: 11 %
Neutro Abs: 3.3 10*3/uL (ref 1.7–7.7)
Neutrophils Relative %: 71 %
Platelets: 164 10*3/uL (ref 150–400)
RBC: 3.28 MIL/uL — ABNORMAL LOW (ref 3.87–5.11)
RDW: 15.7 % — ABNORMAL HIGH (ref 11.5–15.5)
WBC: 4.7 10*3/uL (ref 4.0–10.5)
nRBC: 0 % (ref 0.0–0.2)

## 2022-06-09 LAB — COMPREHENSIVE METABOLIC PANEL
ALT: 29 U/L (ref 0–44)
AST: 35 U/L (ref 15–41)
Albumin: 3.2 g/dL — ABNORMAL LOW (ref 3.5–5.0)
Alkaline Phosphatase: 92 U/L (ref 38–126)
Anion gap: 7 (ref 5–15)
BUN: 23 mg/dL (ref 8–23)
CO2: 28 mmol/L (ref 22–32)
Calcium: 9.1 mg/dL (ref 8.9–10.3)
Chloride: 106 mmol/L (ref 98–111)
Creatinine, Ser: 1.11 mg/dL — ABNORMAL HIGH (ref 0.44–1.00)
GFR, Estimated: 51 mL/min — ABNORMAL LOW (ref >60)
Glucose, Bld: 94 mg/dL (ref 70–99)
Potassium: 3.8 mmol/L (ref 3.5–5.1)
Sodium: 141 mmol/L (ref 135–145)
Total Bilirubin: 0.7 mg/dL (ref 0.3–1.2)
Total Protein: 6.6 g/dL (ref 6.5–8.1)

## 2022-06-09 LAB — IRON AND TIBC
Iron: 42 ug/dL (ref 28–170)
Saturation Ratios: 19 % (ref 10.4–31.8)
TIBC: 217 ug/dL — ABNORMAL LOW (ref 250–450)
UIBC: 175 ug/dL

## 2022-06-09 LAB — TSH: TSH: 3.485 u[IU]/mL (ref 0.350–4.500)

## 2022-06-09 LAB — LACTATE DEHYDROGENASE: LDH: 202 U/L — ABNORMAL HIGH (ref 98–192)

## 2022-06-09 LAB — FERRITIN: Ferritin: 35 ng/mL (ref 11–307)

## 2022-06-09 MED ORDER — BORTEZOMIB CHEMO SQ INJECTION 3.5 MG (2.5MG/ML)
1.3000 mg/m2 | Freq: Once | INTRAMUSCULAR | Status: AC
  Administered 2022-06-09: 2 mg via SUBCUTANEOUS
  Filled 2022-06-09: qty 0.8

## 2022-06-09 MED ORDER — DEXAMETHASONE 4 MG PO TABS
10.0000 mg | ORAL_TABLET | Freq: Once | ORAL | Status: AC
  Administered 2022-06-09: 10 mg via ORAL
  Filled 2022-06-09: qty 3

## 2022-06-09 NOTE — Progress Notes (Signed)
Patient tolerated Velcade injection with no complaints voiced. Lab work reviewed. See MAR for details. Injection site clean and dry with no bruising or swelling noted. Patient stable during and after injection. Band aid applied. VSS. Patient left in satisfactory condition with no s/s of distress noted. 

## 2022-06-10 LAB — IGG, IGA, IGM
IgA: 86 mg/dL (ref 64–422)
IgG (Immunoglobin G), Serum: 1251 mg/dL (ref 586–1602)
IgM (Immunoglobulin M), Srm: 33 mg/dL (ref 26–217)

## 2022-06-16 ENCOUNTER — Inpatient Hospital Stay (HOSPITAL_COMMUNITY): Payer: Medicare PPO

## 2022-06-16 DIAGNOSIS — M810 Age-related osteoporosis without current pathological fracture: Secondary | ICD-10-CM | POA: Diagnosis not present

## 2022-06-16 DIAGNOSIS — Z9181 History of falling: Secondary | ICD-10-CM | POA: Diagnosis not present

## 2022-06-16 DIAGNOSIS — M6281 Muscle weakness (generalized): Secondary | ICD-10-CM | POA: Diagnosis not present

## 2022-06-16 DIAGNOSIS — R262 Difficulty in walking, not elsewhere classified: Secondary | ICD-10-CM | POA: Diagnosis not present

## 2022-06-17 DIAGNOSIS — M810 Age-related osteoporosis without current pathological fracture: Secondary | ICD-10-CM | POA: Diagnosis not present

## 2022-06-17 DIAGNOSIS — Z9181 History of falling: Secondary | ICD-10-CM | POA: Diagnosis not present

## 2022-06-17 DIAGNOSIS — M6281 Muscle weakness (generalized): Secondary | ICD-10-CM | POA: Diagnosis not present

## 2022-06-17 DIAGNOSIS — R262 Difficulty in walking, not elsewhere classified: Secondary | ICD-10-CM | POA: Diagnosis not present

## 2022-06-18 ENCOUNTER — Inpatient Hospital Stay (HOSPITAL_COMMUNITY): Payer: Medicare PPO

## 2022-06-18 ENCOUNTER — Inpatient Hospital Stay (HOSPITAL_COMMUNITY): Payer: Medicare PPO | Attending: Hematology

## 2022-06-18 VITALS — BP 137/73 | HR 77 | Temp 97.8°F | Resp 18 | Wt 118.8 lb

## 2022-06-18 DIAGNOSIS — C9000 Multiple myeloma not having achieved remission: Secondary | ICD-10-CM

## 2022-06-18 DIAGNOSIS — D509 Iron deficiency anemia, unspecified: Secondary | ICD-10-CM | POA: Diagnosis not present

## 2022-06-18 DIAGNOSIS — Z5112 Encounter for antineoplastic immunotherapy: Secondary | ICD-10-CM | POA: Insufficient documentation

## 2022-06-18 LAB — COMPREHENSIVE METABOLIC PANEL
ALT: 25 U/L (ref 0–44)
AST: 37 U/L (ref 15–41)
Albumin: 3.2 g/dL — ABNORMAL LOW (ref 3.5–5.0)
Alkaline Phosphatase: 83 U/L (ref 38–126)
Anion gap: 7 (ref 5–15)
BUN: 23 mg/dL (ref 8–23)
CO2: 27 mmol/L (ref 22–32)
Calcium: 8.9 mg/dL (ref 8.9–10.3)
Chloride: 109 mmol/L (ref 98–111)
Creatinine, Ser: 1.23 mg/dL — ABNORMAL HIGH (ref 0.44–1.00)
GFR, Estimated: 45 mL/min — ABNORMAL LOW (ref >60)
Glucose, Bld: 117 mg/dL — ABNORMAL HIGH (ref 70–99)
Potassium: 3.8 mmol/L (ref 3.5–5.1)
Sodium: 143 mmol/L (ref 135–145)
Total Bilirubin: 0.5 mg/dL (ref 0.3–1.2)
Total Protein: 6.5 g/dL (ref 6.5–8.1)

## 2022-06-18 LAB — CBC WITH DIFFERENTIAL/PLATELET
Abs Immature Granulocytes: 0.01 10*3/uL (ref 0.00–0.07)
Basophils Absolute: 0 10*3/uL (ref 0.0–0.1)
Basophils Relative: 1 %
Eosinophils Absolute: 0.1 10*3/uL (ref 0.0–0.5)
Eosinophils Relative: 3 %
HCT: 32.4 % — ABNORMAL LOW (ref 36.0–46.0)
Hemoglobin: 10.6 g/dL — ABNORMAL LOW (ref 12.0–15.0)
Immature Granulocytes: 0 %
Lymphocytes Relative: 15 %
Lymphs Abs: 0.6 10*3/uL — ABNORMAL LOW (ref 0.7–4.0)
MCH: 31.7 pg (ref 26.0–34.0)
MCHC: 32.7 g/dL (ref 30.0–36.0)
MCV: 97 fL (ref 80.0–100.0)
Monocytes Absolute: 0.5 10*3/uL (ref 0.1–1.0)
Monocytes Relative: 13 %
Neutro Abs: 2.8 10*3/uL (ref 1.7–7.7)
Neutrophils Relative %: 68 %
Platelets: 120 10*3/uL — ABNORMAL LOW (ref 150–400)
RBC: 3.34 MIL/uL — ABNORMAL LOW (ref 3.87–5.11)
RDW: 15.7 % — ABNORMAL HIGH (ref 11.5–15.5)
WBC: 4.1 10*3/uL (ref 4.0–10.5)
nRBC: 0 % (ref 0.0–0.2)

## 2022-06-18 LAB — MAGNESIUM: Magnesium: 2.2 mg/dL (ref 1.7–2.4)

## 2022-06-18 MED ORDER — OCTREOTIDE ACETATE 20 MG IM KIT
PACK | INTRAMUSCULAR | Status: DC
Start: 2022-06-18 — End: 2022-06-18
  Filled 2022-06-18: qty 1

## 2022-06-18 MED ORDER — DEXAMETHASONE 4 MG PO TABS
10.0000 mg | ORAL_TABLET | Freq: Once | ORAL | Status: AC
  Administered 2022-06-18: 10 mg via ORAL
  Filled 2022-06-18: qty 3

## 2022-06-18 MED ORDER — BORTEZOMIB CHEMO SQ INJECTION 3.5 MG (2.5MG/ML)
1.3000 mg/m2 | Freq: Once | INTRAMUSCULAR | Status: AC
  Administered 2022-06-18: 2 mg via SUBCUTANEOUS
  Filled 2022-06-18: qty 0.8

## 2022-06-18 NOTE — Progress Notes (Signed)
Patient tolerated Velcade injection with no complaints voiced. Lab work reviewed. See MAR for details. Injection site clean and dry with no bruising or swelling noted. Patient stable during and after injection. Band aid applied. VSS. Patient left in satisfactory condition with no s/s of distress noted. 

## 2022-06-18 NOTE — Patient Instructions (Signed)
[facility]  Discharge Instructions: Thank you for choosing [facility] to provide your oncology and hematology care.  If you have a lab appointment with the Cancer Center, please come in thru the Main Entrance and check in at the main information desk.  Wear comfortable clothing and clothing appropriate for easy access to any Portacath or PICC line.   We strive to give you quality time with your provider. You may need to reschedule your appointment if you arrive late (15 or more minutes).  Arriving late affects you and other patients whose appointments are after yours.  Also, if you miss three or more appointments without notifying the office, you may be dismissed from the clinic at the provider's discretion.      For prescription refill requests, have your pharmacy contact our office and allow 72 hours for refills to be completed.    Today you received the following chemotherapy and/or immunotherapy agents Velcade, return as scheduled. Bortezomib injection What is this medication? BORTEZOMIB (bor TEZ oh mib) targets proteins in cancer cells and stops the cancer cells from growing. It treats multiple myeloma and mantle cell lymphoma. This medicine may be used for other purposes; ask your health care provider or pharmacist if you have questions. COMMON BRAND NAME(S): Velcade What should I tell my care team before I take this medication? They need to know if you have any of these conditions: dehydration diabetes (high blood sugar) heart disease liver disease tingling of the fingers or toes or other nerve disorder an unusual or allergic reaction to bortezomib, mannitol, boron, other medicines, foods, dyes, or preservatives pregnant or trying to get pregnant breast-feeding How should I use this medication? This medicine is injected into a vein or under the skin. It is given by a health care provider in a hospital or clinic setting. Talk to your health care provider  about the use of this medicine in children. Special care may be needed. Overdosage: If you think you have taken too much of this medicine contact a poison control center or emergency room at once. NOTE: This medicine is only for you. Do not share this medicine with others. What if I miss a dose? Keep appointments for follow-up doses. It is important not to miss your dose. Call your health care provider if you are unable to keep an appointment. What may interact with this medication? This medicine may interact with the following medications: ketoconazole rifampin This list may not describe all possible interactions. Give your health care provider a list of all the medicines, herbs, non-prescription drugs, or dietary supplements you use. Also tell them if you smoke, drink alcohol, or use illegal drugs. Some items may interact with your medicine. What should I watch for while using this medication? Your condition will be monitored carefully while you are receiving this medicine. You may need blood work done while you are taking this medicine. You may get drowsy or dizzy. Do not drive, use machinery, or do anything that needs mental alertness until you know how this medicine affects you. Do not stand up or sit up quickly, especially if you are an older patient. This reduces the risk of dizzy or fainting spells This medicine may increase your risk of getting an infection. Call your health care provider for advice if you get a fever, chills, sore throat, or other symptoms of a cold or flu. Do not treat yourself. Try to avoid being around people who are sick. Check with your health   care provider if you have severe diarrhea, nausea, and vomiting, or if you sweat a lot. The loss of too much body fluid may make it dangerous for you to take this medicine. Do not become pregnant while taking this medicine or for 7 months after stopping it. Women should inform their health care provider if they wish to become  pregnant or think they might be pregnant. Men should not father a child while taking this medicine and for 4 months after stopping it. There is a potential for serious harm to an unborn child. Talk to your health care provider for more information. Do not breast-feed an infant while taking this medicine or for 2 months after stopping it. This medicine may make it more difficult to get pregnant or father a child. Talk to your health care provider if you are concerned about your fertility. What side effects may I notice from receiving this medication? Side effects that you should report to your doctor or health care professional as soon as possible: allergic reactions (skin rash; itching or hives; swelling of the face, lips, or tongue) bleeding (bloody or black, tarry stools; red or dark brown urine; spitting up blood or brown material that looks like coffee grounds; red spots on the skin; unusual bruising or bleeding from the eye, gums, or nose) blurred vision or changes in vision confusion constipation headache heart failure (trouble breathing; fast, irregular heartbeat; sudden weight gain; swelling of the ankles, feet, hands) infection (fever, chills, cough, sore throat, pain or trouble passing urine) lack or loss of appetite liver injury (dark yellow or brown urine; general ill feeling or flu-like symptoms; loss of appetite, right upper belly pain; yellowing of the eyes or skin) low blood pressure (dizziness; feeling faint or lightheaded, falls; unusually weak or tired) muscle cramps pain, redness, or irritation at site where injected pain, tingling, numbness in the hands or feet seizures trouble breathing unusual bruising or bleeding Side effects that usually do not require medical attention (report to your doctor or health care professional if they continue or are bothersome): diarrhea nausea stomach pain trouble sleeping vomiting This list may not describe all possible side effects.  Call your doctor for medical advice about side effects. You may report side effects to FDA at 1-800-FDA-1088. Where should I keep my medication? This medicine is given in a hospital or clinic. It will not be stored at home. NOTE: This sheet is a summary. It may not cover all possible information. If you have questions about this medicine, talk to your doctor, pharmacist, or health care provider.  2023 Elsevier/Gold Standard (2020-11-22 00:00:00)

## 2022-06-19 MED ORDER — LANREOTIDE ACETATE 120 MG/0.5ML ~~LOC~~ SOLN
SUBCUTANEOUS | Status: AC
  Filled 2022-06-19: qty 120

## 2022-06-23 ENCOUNTER — Inpatient Hospital Stay (HOSPITAL_COMMUNITY): Payer: Medicare PPO

## 2022-06-23 DIAGNOSIS — Z794 Long term (current) use of insulin: Secondary | ICD-10-CM | POA: Diagnosis not present

## 2022-06-23 DIAGNOSIS — B351 Tinea unguium: Secondary | ICD-10-CM | POA: Diagnosis not present

## 2022-06-23 DIAGNOSIS — E1140 Type 2 diabetes mellitus with diabetic neuropathy, unspecified: Secondary | ICD-10-CM | POA: Diagnosis not present

## 2022-06-23 DIAGNOSIS — M2042 Other hammer toe(s) (acquired), left foot: Secondary | ICD-10-CM | POA: Diagnosis not present

## 2022-06-25 ENCOUNTER — Inpatient Hospital Stay (HOSPITAL_COMMUNITY): Payer: Medicare PPO

## 2022-06-25 ENCOUNTER — Encounter (HOSPITAL_COMMUNITY): Payer: Self-pay

## 2022-06-25 VITALS — BP 141/78 | HR 72 | Temp 97.6°F | Resp 18 | Wt 117.6 lb

## 2022-06-25 DIAGNOSIS — D509 Iron deficiency anemia, unspecified: Secondary | ICD-10-CM | POA: Insufficient documentation

## 2022-06-25 DIAGNOSIS — C9000 Multiple myeloma not having achieved remission: Secondary | ICD-10-CM

## 2022-06-25 DIAGNOSIS — Z5112 Encounter for antineoplastic immunotherapy: Secondary | ICD-10-CM | POA: Diagnosis not present

## 2022-06-25 LAB — CBC WITH DIFFERENTIAL/PLATELET
Abs Immature Granulocytes: 0 10*3/uL (ref 0.00–0.07)
Basophils Absolute: 0 10*3/uL (ref 0.0–0.1)
Basophils Relative: 1 %
Eosinophils Absolute: 0.1 10*3/uL (ref 0.0–0.5)
Eosinophils Relative: 3 %
HCT: 33.8 % — ABNORMAL LOW (ref 36.0–46.0)
Hemoglobin: 10.7 g/dL — ABNORMAL LOW (ref 12.0–15.0)
Immature Granulocytes: 0 %
Lymphocytes Relative: 12 %
Lymphs Abs: 0.5 10*3/uL — ABNORMAL LOW (ref 0.7–4.0)
MCH: 31.1 pg (ref 26.0–34.0)
MCHC: 31.7 g/dL (ref 30.0–36.0)
MCV: 98.3 fL (ref 80.0–100.0)
Monocytes Absolute: 0.4 10*3/uL (ref 0.1–1.0)
Monocytes Relative: 10 %
Neutro Abs: 3.2 10*3/uL (ref 1.7–7.7)
Neutrophils Relative %: 74 %
Platelets: 110 10*3/uL — ABNORMAL LOW (ref 150–400)
RBC: 3.44 MIL/uL — ABNORMAL LOW (ref 3.87–5.11)
RDW: 15.6 % — ABNORMAL HIGH (ref 11.5–15.5)
WBC: 4.2 10*3/uL (ref 4.0–10.5)
nRBC: 0 % (ref 0.0–0.2)

## 2022-06-25 LAB — COMPREHENSIVE METABOLIC PANEL
ALT: 27 U/L (ref 0–44)
AST: 29 U/L (ref 15–41)
Albumin: 3.3 g/dL — ABNORMAL LOW (ref 3.5–5.0)
Alkaline Phosphatase: 79 U/L (ref 38–126)
Anion gap: 6 (ref 5–15)
BUN: 21 mg/dL (ref 8–23)
CO2: 24 mmol/L (ref 22–32)
Calcium: 8.9 mg/dL (ref 8.9–10.3)
Chloride: 110 mmol/L (ref 98–111)
Creatinine, Ser: 1.1 mg/dL — ABNORMAL HIGH (ref 0.44–1.00)
GFR, Estimated: 52 mL/min — ABNORMAL LOW (ref >60)
Glucose, Bld: 97 mg/dL (ref 70–99)
Potassium: 3.6 mmol/L (ref 3.5–5.1)
Sodium: 140 mmol/L (ref 135–145)
Total Bilirubin: 0.4 mg/dL (ref 0.3–1.2)
Total Protein: 6.4 g/dL — ABNORMAL LOW (ref 6.5–8.1)

## 2022-06-25 LAB — MAGNESIUM: Magnesium: 2.1 mg/dL (ref 1.7–2.4)

## 2022-06-25 MED ORDER — DEXAMETHASONE 4 MG PO TABS
10.0000 mg | ORAL_TABLET | Freq: Once | ORAL | Status: AC
  Administered 2022-06-25: 10 mg via ORAL
  Filled 2022-06-25: qty 3

## 2022-06-25 MED ORDER — BORTEZOMIB CHEMO SQ INJECTION 3.5 MG (2.5MG/ML)
1.3000 mg/m2 | Freq: Once | INTRAMUSCULAR | Status: AC
  Administered 2022-06-25: 2 mg via SUBCUTANEOUS
  Filled 2022-06-25: qty 0.8

## 2022-06-25 NOTE — Patient Instructions (Signed)
[facility]  Discharge Instructions: Thank you for choosing [facility] to provide your oncology and hematology care.  If you have a lab appointment with the [facility], please come in thru the Main Entrance and check in at the main information desk.  Wear comfortable clothing and clothing appropriate for easy access to any Portacath or PICC line.   We strive to give you quality time with your provider. You may need to reschedule your appointment if you arrive late (15 or more minutes).  Arriving late affects you and other patients whose appointments are after yours.  Also, if you miss three or more appointments without notifying the office, you may be dismissed from the clinic at the provider's discretion.      For prescription refill requests, have your pharmacy contact our office and allow 72 hours for refills to be completed.    Today you received the following chemotherapy and/or immunotherapy agents Velcade injection.  Bortezomib injection What is this medication? BORTEZOMIB (bor TEZ oh mib) targets proteins in cancer cells and stops the cancer cells from growing. It treats multiple myeloma and mantle cell lymphoma. This medicine may be used for other purposes; ask your health care provider or pharmacist if you have questions. COMMON BRAND NAME(S): Velcade What should I tell my care team before I take this medication? They need to know if you have any of these conditions: dehydration diabetes (high blood sugar) heart disease liver disease tingling of the fingers or toes or other nerve disorder an unusual or allergic reaction to bortezomib, mannitol, boron, other medicines, foods, dyes, or preservatives pregnant or trying to get pregnant breast-feeding How should I use this medication? This medicine is injected into a vein or under the skin. It is given by a health care provider in a hospital or clinic setting. Talk to your health care provider about the  use of this medicine in children. Special care may be needed. Overdosage: If you think you have taken too much of this medicine contact a poison control center or emergency room at once. NOTE: This medicine is only for you. Do not share this medicine with others. What if I miss a dose? Keep appointments for follow-up doses. It is important not to miss your dose. Call your health care provider if you are unable to keep an appointment. What may interact with this medication? This medicine may interact with the following medications: ketoconazole rifampin This list may not describe all possible interactions. Give your health care provider a list of all the medicines, herbs, non-prescription drugs, or dietary supplements you use. Also tell them if you smoke, drink alcohol, or use illegal drugs. Some items may interact with your medicine. What should I watch for while using this medication? Your condition will be monitored carefully while you are receiving this medicine. You may need blood work done while you are taking this medicine. You may get drowsy or dizzy. Do not drive, use machinery, or do anything that needs mental alertness until you know how this medicine affects you. Do not stand up or sit up quickly, especially if you are an older patient. This reduces the risk of dizzy or fainting spells This medicine may increase your risk of getting an infection. Call your health care provider for advice if you get a fever, chills, sore throat, or other symptoms of a cold or flu. Do not treat yourself. Try to avoid being around people who are sick. Check with your health care  provider if you have severe diarrhea, nausea, and vomiting, or if you sweat a lot. The loss of too much body fluid may make it dangerous for you to take this medicine. Do not become pregnant while taking this medicine or for 7 months after stopping it. Women should inform their health care provider if they wish to become pregnant or  think they might be pregnant. Men should not father a child while taking this medicine and for 4 months after stopping it. There is a potential for serious harm to an unborn child. Talk to your health care provider for more information. Do not breast-feed an infant while taking this medicine or for 2 months after stopping it. This medicine may make it more difficult to get pregnant or father a child. Talk to your health care provider if you are concerned about your fertility. What side effects may I notice from receiving this medication? Side effects that you should report to your doctor or health care professional as soon as possible: allergic reactions (skin rash; itching or hives; swelling of the face, lips, or tongue) bleeding (bloody or black, tarry stools; red or dark brown urine; spitting up blood or brown material that looks like coffee grounds; red spots on the skin; unusual bruising or bleeding from the eye, gums, or nose) blurred vision or changes in vision confusion constipation headache heart failure (trouble breathing; fast, irregular heartbeat; sudden weight gain; swelling of the ankles, feet, hands) infection (fever, chills, cough, sore throat, pain or trouble passing urine) lack or loss of appetite liver injury (dark yellow or brown urine; general ill feeling or flu-like symptoms; loss of appetite, right upper belly pain; yellowing of the eyes or skin) low blood pressure (dizziness; feeling faint or lightheaded, falls; unusually weak or tired) muscle cramps pain, redness, or irritation at site where injected pain, tingling, numbness in the hands or feet seizures trouble breathing unusual bruising or bleeding Side effects that usually do not require medical attention (report to your doctor or health care professional if they continue or are bothersome): diarrhea nausea stomach pain trouble sleeping vomiting This list may not describe all possible side effects. Call your  doctor for medical advice about side effects. You may report side effects to FDA at 1-800-FDA-1088. Where should I keep my medication? This medicine is given in a hospital or clinic. It will not be stored at home. NOTE: This sheet is a summary. It may not cover all possible information. If you have questions about this medicine, talk to your doctor, pharmacist, or health care provider.  2023 Elsevier/Gold Standard (2020-11-22 00:00:00)       To help prevent nausea and vomiting after your treatment, we encourage you to take your nausea medication as directed.  BELOW ARE SYMPTOMS THAT SHOULD BE REPORTED IMMEDIATELY: *FEVER GREATER THAN 100.4 F (38 C) OR HIGHER *CHILLS OR SWEATING *NAUSEA AND VOMITING THAT IS NOT CONTROLLED WITH YOUR NAUSEA MEDICATION *UNUSUAL SHORTNESS OF BREATH *UNUSUAL BRUISING OR BLEEDING *URINARY PROBLEMS (pain or burning when urinating, or frequent urination) *BOWEL PROBLEMS (unusual diarrhea, constipation, pain near the anus) TENDERNESS IN MOUTH AND THROAT WITH OR WITHOUT PRESENCE OF ULCERS (sore throat, sores in mouth, or a toothache) UNUSUAL RASH, SWELLING OR PAIN  UNUSUAL VAGINAL DISCHARGE OR ITCHING   Items with * indicate a potential emergency and should be followed up as soon as possible or go to the Emergency Department if any problems should occur.  Please show the CHEMOTHERAPY ALERT CARD or IMMUNOTHERAPY ALERT CARD at  check-in to the Emergency Department and triage nurse.  Should you have questions after your visit or need to cancel or reschedule your appointment, please contact [facility] [phone]  and follow the prompts.  Office hours are 8:00 a.m. to 4:30 p.m. Monday - Friday. Please note that voicemails left after 4:00 p.m. may not be returned until the following business day.  We are closed weekends and major holidays. You have access to a nurse at all times for urgent questions. Please call the main number to the clinic [phone] and  follow the prompts.  For any non-urgent questions, you may also contact your provider using MyChart. We now offer e-Visits for anyone 34 and older to request care online for non-urgent symptoms. For details visit [email or web address].   Also download the MyChart app! Go to the app store, search "MyChart", open the app, select [facility], and log in with your MyChart username and password.  Masks are optional in the cancer centers. If you would like for your care team to wear a mask while they are taking care of you, please let them know. For doctor visits, patients may have with them one support person who is at least 78 years old. At this time, visitors are not allowed in the infusion area.

## 2022-06-25 NOTE — Progress Notes (Signed)
Patient presents today for Velcade injection. Vital signs and labs within parameters for treatment. Patient has no complaints today.   Velcade injection given today per MD orders. Tolerated without adverse affects. Vital signs stable. No complaints at this time. Discharged from clinic by wheel chair accompaines by CNA in stable condition. Alert and oriented x 3. F/U with [facility] as scheduled.

## 2022-07-02 ENCOUNTER — Inpatient Hospital Stay (HOSPITAL_COMMUNITY): Payer: Medicare PPO

## 2022-07-02 VITALS — BP 144/82 | HR 75 | Temp 98.2°F | Resp 18 | Wt 116.4 lb

## 2022-07-02 DIAGNOSIS — D509 Iron deficiency anemia, unspecified: Secondary | ICD-10-CM | POA: Diagnosis not present

## 2022-07-02 DIAGNOSIS — Z5112 Encounter for antineoplastic immunotherapy: Secondary | ICD-10-CM | POA: Diagnosis not present

## 2022-07-02 DIAGNOSIS — C9000 Multiple myeloma not having achieved remission: Secondary | ICD-10-CM | POA: Diagnosis not present

## 2022-07-02 MED ORDER — SODIUM CHLORIDE 0.9 % IV SOLN
300.0000 mg | Freq: Once | INTRAVENOUS | Status: AC
  Administered 2022-07-02: 300 mg via INTRAVENOUS
  Filled 2022-07-02: qty 300

## 2022-07-02 MED ORDER — SODIUM CHLORIDE 0.9 % IV SOLN: Freq: Once | INTRAVENOUS | Status: AC

## 2022-07-02 MED ORDER — LORATADINE 10 MG PO TABS
10.0000 mg | ORAL_TABLET | Freq: Once | ORAL | Status: AC
  Administered 2022-07-02: 10 mg via ORAL
  Filled 2022-07-02: qty 1

## 2022-07-02 MED ORDER — ACETAMINOPHEN 325 MG PO TABS
650.0000 mg | ORAL_TABLET | Freq: Once | ORAL | Status: AC
  Administered 2022-07-02: 650 mg via ORAL
  Filled 2022-07-02: qty 2

## 2022-07-02 NOTE — Progress Notes (Signed)
Pt presents today for Venofer IV iron infusion per provider's order. Vital signs stable and pt voiced no new complaints at this time.  ? ?Peripheral IV started with good blood return pre and post infusion. ? ?Venofer 300 mg given today per MD orders. Tolerated infusion without adverse affects. Vital signs stable. No complaints at this time. Discharged from clinic via wheelchair in stable condition. Alert and oriented x 3. F/U with [facility] as scheduled.   ?

## 2022-07-02 NOTE — Patient Instructions (Signed)
[facility]  Discharge Instructions: Thank you for choosing [facility] to provide your oncology and hematology care.  If you have a lab appointment with the Cancer Center, please come in thru the Main Entrance and check in at the main information desk.  Wear comfortable clothing and clothing appropriate for easy access to any Portacath or PICC line.   We strive to give you quality time with your provider. You may need to reschedule your appointment if you arrive late (15 or more minutes).  Arriving late affects you and other patients whose appointments are after yours.  Also, if you miss three or more appointments without notifying the office, you may be dismissed from the clinic at the provider's discretion.      For prescription refill requests, have your pharmacy contact our office and allow 72 hours for refills to be completed.    Today you received Venofer IV iron infusion.     BELOW ARE SYMPTOMS THAT SHOULD BE REPORTED IMMEDIATELY: *FEVER GREATER THAN 100.4 F (38 C) OR HIGHER *CHILLS OR SWEATING *NAUSEA AND VOMITING THAT IS NOT CONTROLLED WITH YOUR NAUSEA MEDICATION *UNUSUAL SHORTNESS OF BREATH *UNUSUAL BRUISING OR BLEEDING *URINARY PROBLEMS (pain or burning when urinating, or frequent urination) *BOWEL PROBLEMS (unusual diarrhea, constipation, pain near the anus) TENDERNESS IN MOUTH AND THROAT WITH OR WITHOUT PRESENCE OF ULCERS (sore throat, sores in mouth, or a toothache) UNUSUAL RASH, SWELLING OR PAIN  UNUSUAL VAGINAL DISCHARGE OR ITCHING   Items with * indicate a potential emergency and should be followed up as soon as possible or go to the Emergency Department if any problems should occur.  Please show the CHEMOTHERAPY ALERT CARD or IMMUNOTHERAPY ALERT CARD at check-in to the Emergency Department and triage nurse.  Should you have questions after your visit or need to cancel or reschedule your appointment, please contact [facility]  [phone]  and follow the prompts.  Office hours are 8:00 a.m. to 4:30 p.m. Monday - Friday. Please note that voicemails left after 4:00 p.m. may not be returned until the following business day.  We are closed weekends and major holidays. You have access to a nurse at all times for urgent questions. Please call the main number to the clinic [phone] and follow the prompts.  For any non-urgent questions, you may also contact your provider using MyChart. We now offer e-Visits for anyone 18 and older to request care online for non-urgent symptoms. For details visit [email or web address].   Also download the MyChart app! Go to the app store, search "MyChart", open the app, select [facility], and log in with your MyChart username and password.  Masks are optional in the cancer centers. If you would like for your care team to wear a mask while they are taking care of you, please let them know. For doctor visits, patients may have with them one support person who is at least 78 years old. At this time, visitors are not allowed in the infusion area.  

## 2022-07-07 ENCOUNTER — Other Ambulatory Visit: Payer: Self-pay

## 2022-07-08 ENCOUNTER — Encounter: Payer: Self-pay | Admitting: Adult Health

## 2022-07-08 ENCOUNTER — Non-Acute Institutional Stay (SKILLED_NURSING_FACILITY): Payer: Medicare PPO | Admitting: Adult Health

## 2022-07-08 DIAGNOSIS — I700 Atherosclerosis of aorta: Secondary | ICD-10-CM | POA: Diagnosis not present

## 2022-07-08 DIAGNOSIS — I440 Atrioventricular block, first degree: Secondary | ICD-10-CM | POA: Diagnosis not present

## 2022-07-08 DIAGNOSIS — D539 Nutritional anemia, unspecified: Secondary | ICD-10-CM | POA: Diagnosis not present

## 2022-07-08 DIAGNOSIS — K8689 Other specified diseases of pancreas: Secondary | ICD-10-CM | POA: Diagnosis not present

## 2022-07-08 NOTE — Progress Notes (Signed)
Location:  [facility] Room Number: 148-W Place of Service:  SNF (31)   CODE STATUS: DNR  Allergies  Allergen Reactions   Lotensin [Benazepril]     Chief Complaint  Patient presents with   Medical Management of Chronic Issues                                                  Aortic atherosclerosis  First degree av block/bradycardia:  Pancreatic insufficiency: Macrocytic anemia:    HPI:  She is a 76 year old long term resident of this facility being seen for the management of her chronic illnesses:  Aortic atherosclerosis  First degree av block/bradycardia:  Pancreatic insufficiency: Macrocytic anemia. There are no reports of uncontrolled pain. There are no reports of anxiety or depressive thoughts.   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[place])    Dementia ([place])    Diabetes mellitus with CKD    H/O partial nephrectomy    Hypokalemia    Hypothyroidism    Impaired cognition    Multiple myeloma ([facility])    Dr Maylon Peppers, [facility]    Past Surgical History:  Procedure Laterality Date   ABDOMINAL HYSTERECTOMY     CRANIOTOMY     for lymphoma   YAG LASER APPLICATION Right 6/44/0347   Procedure: YAG LASER APPLICATION;  Surgeon: [name], MD;  Location: [facility];  Service: Ophthalmology;  Laterality: Right;    Social History   Socioeconomic History   Marital status: Single    Spouse name: Not on file   Number of children: Not on file   Years of education: Not on file   Highest education level: Not on file  Occupational History   Occupation: retired   Tobacco Use   Smoking status: Never   Smokeless tobacco: Never  Vaping Use   Vaping Use: Never used  Substance and Sexual Activity   Alcohol use: No   Drug use: No   Sexual activity: Never  Other Topics Concern   Not on file  Social History Narrative   Long term resident of [facility]    Social Determinants of Health   Financial  Resource Strain: [place]  (10/16/2020)   Overall Financial Resource Strain (CARDIA)    Difficulty of Paying Living Expenses: Not very hard  Food Insecurity: No Food Insecurity (10/16/2020)   Hunger Vital Sign    Worried About Running Out of Food in the Last Year: Never true    Ran Out of Food in the Last Year: Never true  Transportation Needs: No Transportation Needs (10/16/2020)   PRAPARE - Hydrologist (Medical): No    Lack of Transportation (Non-Medical): No  Physical Activity: Inactive (10/16/2020)   Exercise Vital Sign    Days of Exercise per Week: 0 days    Minutes of Exercise per Session: 0 min  Stress: No Stress Concern Present (10/16/2020)   [place]    Feeling of Stress : Not at all  Social Connections: Moderately Isolated (10/16/2020)   Social Connection and Isolation Panel [NHANES]    Frequency of Communication with Friends and Family: More than three times a week    Frequency of Social Gatherings with Friends  and Family: Once a week    Attends Religious Services: More than 4 times per year    Active Member of Clubs or Organizations: No    Attends Archivist Meetings: Never    Marital Status: Never married  Human resources officer Violence: Not on file   Family History  Problem Relation Age of Onset   Depression Mother    Diabetes Mother    Heart disease Father    Cancer - Prostate Brother    Cancer Brother    Cancer Sister       VITAL SIGNS BP 120/65   Pulse 73   Temp (!) 97.5 F (36.4 C)   Resp 18   Ht '5\' 9"'  (1.753 m)   Wt 121 lb 1.6 oz (54.9 kg)   SpO2 97%   BMI 17.88 kg/m    Outpatient Encounter Medications as of 07/08/2022  Medication Sig   acetaminophen (TYLENOL) 500 MG tablet Take 1,000 mg by mouth every 6 (six) hours as needed for mild pain.   acyclovir (ZOVIRAX) 400 MG tablet TAKE 1 TABLET BY MOUTH TWICE DAILY   aspirin EC 81 MG tablet Take 81 mg  by mouth daily.   busPIRone (BUSPAR) 10 MG tablet Take 10 mg by mouth 3 (three) times daily. For anxiety   Calcium Carbonate-Vit D-Min (CALCIUM 600+D PLUS MINERALS) 600-400 MG-UNIT CHEW Chew 1 tablet by mouth daily.   cholestyramine (QUESTRAN) 4 g packet Take 4 g by mouth 2 (two) times daily between meals.   Lactobacillus Probiotic TABS Take 2 tablets by mouth daily.   levothyroxine (SYNTHROID, LEVOTHROID) 25 MCG tablet Take 25 mcg by mouth daily before breakfast.   lipase/protease/amylase (CREON) 36000 UNITS CPEP capsule 36,000-114,000- 180,000 unit; amt: 1; oral Special Instructions: Take one capsule with snacks if eaten between meals. Twice A Day Between Meals as needed   loperamide (IMODIUM A-D) 2 MG tablet Take 2 mg by mouth 3 (three) times daily. For diarrhea   losartan (COZAAR) 50 MG tablet Take 1 tablet (50 mg total) by mouth daily.   NON FORMULARY Dysphagia 2 diet with thin liquids   NON FORMULARY Wanderguard #2237 to ankle for safety awareness. Check placement and function qshift. Special Instructions: Check placement and function qshift. Every Shift Day, Evening, Night   potassium chloride SA (KLOR-CON) 20 MEQ tablet Take 20 mEq by mouth daily.    sertraline (ZOLOFT) 25 MG tablet Take 25 mg by mouth daily.     SIGNIFICANT DIAGNOSTIC EXAMS   PREVIOUS   08-10-20: t score: -3.758  03-16-22: chest x-ray:  No radiographic evidence of acute cardiopulmonary disease Mild cardiomegaly  Mild osteopenia Mild osteoarthritis   NO NEW EXAMS     LABS REVIEWED PREVIOUS    07-15-21: wbc 4.7; hgb 11.7; hct 35.9; mcv 97.0 plt 220; glucose 161; bun 21; creat 1.23; k+ 3.9; na++ 138; ca 8.8; GFR 46 liver normal albumin 3.2 uric acid 4.4 08-08-21: urine culture: 70,000 e-coli 08-26-21: wbc 6.3; hgb 11.1; hct 35.3; mcv 99.2 plt 165; glucose 117; bun 16; creat 1.02; k+ 4.0; na++ 141; ca 9.1 GFR 57; liver normal albumin 3.1  09-05-21: hgb a1c 5.9; urine micro-albumin 7.9 10-07-21: wbc 5.6; hgb  10.5; hct 32.5; mcv 100.3 plt 163; glucose 105; bun 25; creat 1.14; k+ 3.8; na++ 138; ca 8.8; GFR 50 liver normal albumin 3.2 10-10-21: chol 153; ldl 81; trig 56; hdl 61; tsh 2.577 11-11-21: wbc 4.6; hgb 10.9; hct 34.5; mcv 98.6 plt 160; glucose 183; bun 25; creat  1.23; k+ 3.8; na++ 143; ca 9.1; GFR 45; liver normal albumin 3.3; mag 2.3  12-23-21: wbc 4,1 hgb 11.9; hct 36.8; mcv 98.1 plt 106; glucose 117; bun 20; creat 1.17; k+ 3.4; na++ 142; ca 8.9; GFR 48; liver normal albumin  3.5  mag 2.2  01-06-22: hgb a1c 5.9 03-17-22: wbc 3.6; hgb 10.9; hct 34.7; mcv 97.2 plt 121; glucose 140; bun 20; creat 1.16; k+ 3.6; na++ 144; ca 9.2 GFR 49; protein 6.7; albumin 3.4 ast 69 03-17-22: wbc 4.1; hgb 10.9; hct 32.5; mcv 96.2 plt 98; glucose 96; bbun 22; creat 1.17; k+ 3.5; na++ 142; ca 9.0; GFR 48 d-dimer 1.67; CRP 1.2  03-24-22: d-dimer 0.72  TODAY  05-13-22: wbc 5.2; hgb 10.7; hct 32.6; mcv 98.5 plt 148; glucose 85; bun 21; creat 1.31; k+ 3.4; na++ 141; ca 9.0; gfr 42; protein 6.5; albumin 3.3  05-15-22: hgb a1c 5.4 05-20-22: wbc 4.5; hgb 11.6; hct 35.6; mcv 97.8 plt 124 06-09-22: wbc 4.7; hgb 10.3; hct 32.0; mcv 97.6 plt 164; glucose 94; bun 23; creat 1.11; k+ 3.8; na++ 141; ca 9.1; gfr 51 protein 6.2 albumin 3.2 tah 3.485 06-25-22: wbc 4.2; hgb 10.7; hct 33.8; mcv 98.3 plt 110; glucose 97; bun 21; creat 1.10; k+ 3.6; na++ 140; ca 8.9; gfr 52; protein 6.4; albumin 3.3   Review of Systems  Constitutional:  Negative for malaise/fatigue.  Respiratory:  Negative for cough and shortness of breath.   Cardiovascular:  Negative for chest pain, palpitations and leg swelling.  Gastrointestinal:  Negative for abdominal pain, constipation and heartburn.  Musculoskeletal:  Negative for back pain, joint pain and myalgias.  Skin: Negative.   Neurological:  Negative for dizziness.  Psychiatric/Behavioral:  The patient is not nervous/anxious.     Physical Exam Constitutional:      General: She is not in acute distress.     Appearance: She is underweight. She is not diaphoretic.  Neck:     Thyroid: No thyromegaly.  Cardiovascular:     Rate and Rhythm: Normal rate and regular rhythm.     Pulses: Normal pulses.     Heart sounds: Normal heart sounds.  Pulmonary:     Effort: Pulmonary effort is normal. No respiratory distress.     Breath sounds: Normal breath sounds.  Abdominal:     General: Bowel sounds are normal. There is no distension.     Palpations: Abdomen is soft.     Tenderness: There is no abdominal tenderness.  Musculoskeletal:        General: Normal range of motion.     Cervical back: Neck supple.     Right lower leg: No edema.     Left lower leg: No edema.  Lymphadenopathy:     Cervical: No cervical adenopathy.  Skin:    General: Skin is warm and dry.  Neurological:     Mental Status: She is alert. Mental status is at baseline.  Psychiatric:        Mood and Affect: Mood normal.        ASSESSMENT/ PLAN:  TODAY  Aortic atherosclerosis (ct 08-25-14) is on statin and asa  2. First degree av block/bradycardia: has been seen by cardiology no further interventions;   3. Pancreatic insufficiency: will continue questran 4 gm twice daily creon 72,000 units with meals; and imodium 2 mg three times daily   4. Macrocytic anemia: hgb 10.7; vitamin B21: 620 is off iron due to diarrhea    PREVIOUS    5. Vascular dementia  with behavioral disturbance: weight is 121 pounds; will continue buspar 10 mg twice daily to help with mood state is off aricept   6. Herpes: no recent outbreaks will continue acyclovir 400 mg twice daily   7. Failure to thrive in adult: weight is 121 pounds is stable will monitor   8. Hypokalemia: k+ 3.6 will continue k+ 20 meq daily   9. Hypothyroidism, unspecified type: tsh 3.485 will continue synthroid 25 mcg daily   10. Hypertension associated with stage 3b chronic kidney disease due to type 2 diabetes mellitus: b/p 120/65 cozaar 50 mg daily asa 81 mg daily   11.  Diabetes mellitus with stage 3 chronic kidney disease and hypertension: hgb a1c 5.4; is on arb and statin  12. Post menopausal osteoporosis t score -3.758 will continue calcium and vitamin d   13. Thrombocytopenia: plt is 110 will monitor  14. Multiple myeloma without achieving remission: is followed by oncology   Will get urine for micro albumin   Ok Edwards NP [facility]   call [phone]

## 2022-07-09 ENCOUNTER — Inpatient Hospital Stay (HOSPITAL_COMMUNITY): Payer: Medicare PPO

## 2022-07-09 VITALS — BP 153/83 | HR 75 | Temp 97.4°F | Resp 18 | Wt 115.6 lb

## 2022-07-09 DIAGNOSIS — C9000 Multiple myeloma not having achieved remission: Secondary | ICD-10-CM | POA: Diagnosis not present

## 2022-07-09 DIAGNOSIS — Z5112 Encounter for antineoplastic immunotherapy: Secondary | ICD-10-CM | POA: Diagnosis not present

## 2022-07-09 DIAGNOSIS — D509 Iron deficiency anemia, unspecified: Secondary | ICD-10-CM | POA: Diagnosis not present

## 2022-07-09 LAB — MAGNESIUM: Magnesium: 2.1 mg/dL (ref 1.7–2.4)

## 2022-07-09 LAB — CBC WITH DIFFERENTIAL/PLATELET
Abs Immature Granulocytes: 0 10*3/uL (ref 0.00–0.07)
Basophils Absolute: 0 10*3/uL (ref 0.0–0.1)
Basophils Relative: 1 %
Eosinophils Absolute: 0.1 10*3/uL (ref 0.0–0.5)
Eosinophils Relative: 3 %
HCT: 33 % — ABNORMAL LOW (ref 36.0–46.0)
Hemoglobin: 10.8 g/dL — ABNORMAL LOW (ref 12.0–15.0)
Immature Granulocytes: 0 %
Lymphocytes Relative: 15 %
Lymphs Abs: 0.7 10*3/uL (ref 0.7–4.0)
MCH: 31.9 pg (ref 26.0–34.0)
MCHC: 32.7 g/dL (ref 30.0–36.0)
MCV: 97.3 fL (ref 80.0–100.0)
Monocytes Absolute: 0.4 10*3/uL (ref 0.1–1.0)
Monocytes Relative: 9 %
Neutro Abs: 3.2 10*3/uL (ref 1.7–7.7)
Neutrophils Relative %: 72 %
Platelets: 158 10*3/uL (ref 150–400)
RBC: 3.39 MIL/uL — ABNORMAL LOW (ref 3.87–5.11)
RDW: 15.8 % — ABNORMAL HIGH (ref 11.5–15.5)
WBC: 4.4 10*3/uL (ref 4.0–10.5)
nRBC: 0 % (ref 0.0–0.2)

## 2022-07-09 LAB — COMPREHENSIVE METABOLIC PANEL
ALT: 21 U/L (ref 0–44)
AST: 30 U/L (ref 15–41)
Albumin: 3.4 g/dL — ABNORMAL LOW (ref 3.5–5.0)
Alkaline Phosphatase: 77 U/L (ref 38–126)
Anion gap: 7 (ref 5–15)
BUN: 17 mg/dL (ref 8–23)
CO2: 25 mmol/L (ref 22–32)
Calcium: 9 mg/dL (ref 8.9–10.3)
Chloride: 111 mmol/L (ref 98–111)
Creatinine, Ser: 1.11 mg/dL — ABNORMAL HIGH (ref 0.44–1.00)
GFR, Estimated: 51 mL/min — ABNORMAL LOW (ref >60)
Glucose, Bld: 93 mg/dL (ref 70–99)
Potassium: 3.9 mmol/L (ref 3.5–5.1)
Sodium: 143 mmol/L (ref 135–145)
Total Bilirubin: 0.5 mg/dL (ref 0.3–1.2)
Total Protein: 6.9 g/dL (ref 6.5–8.1)

## 2022-07-09 LAB — LACTATE DEHYDROGENASE: LDH: 208 U/L — ABNORMAL HIGH (ref 98–192)

## 2022-07-09 MED ORDER — LORATADINE 10 MG PO TABS
10.0000 mg | ORAL_TABLET | Freq: Once | ORAL | Status: AC
  Administered 2022-07-09: 10 mg via ORAL
  Filled 2022-07-09: qty 1

## 2022-07-09 MED ORDER — SODIUM CHLORIDE 0.9 % IV SOLN
300.0000 mg | Freq: Once | INTRAVENOUS | Status: AC
  Administered 2022-07-09: 300 mg via INTRAVENOUS
  Filled 2022-07-09: qty 15

## 2022-07-09 MED ORDER — SODIUM CHLORIDE 0.9 % IV SOLN: Freq: Once | INTRAVENOUS | Status: AC

## 2022-07-09 MED ORDER — BORTEZOMIB CHEMO SQ INJECTION 3.5 MG (2.5MG/ML)
1.3000 mg/m2 | Freq: Once | INTRAMUSCULAR | Status: AC
  Administered 2022-07-09: 2 mg via SUBCUTANEOUS
  Filled 2022-07-09: qty 0.8

## 2022-07-09 MED ORDER — ACETAMINOPHEN 325 MG PO TABS
650.0000 mg | ORAL_TABLET | Freq: Once | ORAL | Status: AC
  Administered 2022-07-09: 650 mg via ORAL
  Filled 2022-07-09: qty 2

## 2022-07-09 MED ORDER — DEXAMETHASONE 4 MG PO TABS
10.0000 mg | ORAL_TABLET | Freq: Once | ORAL | Status: AC
  Administered 2022-07-09: 10 mg via ORAL
  Filled 2022-07-09: qty 3

## 2022-07-09 NOTE — Progress Notes (Signed)
Patient presents today for Velcade injection per providers order.  Vital signs and labs within parameters for treatment.  Stable during administration without incident; injection site WNL; see MAR for injection details.  Patient tolerated procedure well and without incident.  No questions or complaints noted at this time.

## 2022-07-09 NOTE — Progress Notes (Signed)
Patient presents today for Venofer infusion per providers order.  Vital signs WNL.  Patient has no new complaints at this time.    Peripheral IV started and blood return noted pre and post infusion.  Venofer given today per MD orders.  Stable during infusion without adverse affects.  Vital signs stable.  No complaints at this time.  Discharge from clinic via wheelchair in stable condition.  Alert and oriented X 3.  Follow up with [facility] as scheduled.  

## 2022-07-09 NOTE — Patient Instructions (Signed)
[facility]  Discharge Instructions: Thank you for choosing [facility] to provide your oncology and hematology care.  If you have a lab appointment with the Cancer Center, please come in thru the Main Entrance and check in at the main information desk.  Wear comfortable clothing and clothing appropriate for easy access to any Portacath or PICC line.   We strive to give you quality time with your provider. You may need to reschedule your appointment if you arrive late (15 or more minutes).  Arriving late affects you and other patients whose appointments are after yours.  Also, if you miss three or more appointments without notifying the office, you may be dismissed from the clinic at the provider's discretion.      For prescription refill requests, have your pharmacy contact our office and allow 72 hours for refills to be completed.    Today you received the following chemotherapy and/or immunotherapy agents Venofer      To help prevent nausea and vomiting after your treatment, we encourage you to take your nausea medication as directed.  BELOW ARE SYMPTOMS THAT SHOULD BE REPORTED IMMEDIATELY: *FEVER GREATER THAN 100.4 F (38 C) OR HIGHER *CHILLS OR SWEATING *NAUSEA AND VOMITING THAT IS NOT CONTROLLED WITH YOUR NAUSEA MEDICATION *UNUSUAL SHORTNESS OF BREATH *UNUSUAL BRUISING OR BLEEDING *URINARY PROBLEMS (pain or burning when urinating, or frequent urination) *BOWEL PROBLEMS (unusual diarrhea, constipation, pain near the anus) TENDERNESS IN MOUTH AND THROAT WITH OR WITHOUT PRESENCE OF ULCERS (sore throat, sores in mouth, or a toothache) UNUSUAL RASH, SWELLING OR PAIN  UNUSUAL VAGINAL DISCHARGE OR ITCHING   Items with * indicate a potential emergency and should be followed up as soon as possible or go to the Emergency Department if any problems should occur.  Please show the CHEMOTHERAPY ALERT CARD or IMMUNOTHERAPY ALERT CARD at check-in to the Emergency  Department and triage nurse.  Should you have questions after your visit or need to cancel or reschedule your appointment, please contact [facility] [phone]  and follow the prompts.  Office hours are 8:00 a.m. to 4:30 p.m. Monday - Friday. Please note that voicemails left after 4:00 p.m. may not be returned until the following business day.  We are closed weekends and major holidays. You have access to a nurse at all times for urgent questions. Please call the main number to the clinic [phone] and follow the prompts.  For any non-urgent questions, you may also contact your provider using MyChart. We now offer e-Visits for anyone 18 and older to request care online for non-urgent symptoms. For details visit [email or web address].   Also download the MyChart app! Go to the app store, search "MyChart", open the app, select [facility], and log in with your MyChart username and password.  Masks are optional in the cancer centers. If you would like for your care team to wear a mask while they are taking care of you, please let them know. For doctor visits, patients may have with them one support person who is at least 78 years old. At this time, visitors are not allowed in the infusion area.  

## 2022-07-09 NOTE — Patient Instructions (Signed)
[facility]  Discharge Instructions: Thank you for choosing [facility] to provide your oncology and hematology care.  If you have a lab appointment with the [facility], please come in thru the Main Entrance and check in at the main information desk.  Wear comfortable clothing and clothing appropriate for easy access to any Portacath or PICC line.   We strive to give you quality time with your provider. You may need to reschedule your appointment if you arrive late (15 or more minutes).  Arriving late affects you and other patients whose appointments are after yours.  Also, if you miss three or more appointments without notifying the office, you may be dismissed from the clinic at the provider's discretion.      For prescription refill requests, have your pharmacy contact our office and allow 72 hours for refills to be completed.    Today you received the following chemotherapy and/or immunotherapy agents Velcade      To help prevent nausea and vomiting after your treatment, we encourage you to take your nausea medication as directed.  BELOW ARE SYMPTOMS THAT SHOULD BE REPORTED IMMEDIATELY: *FEVER GREATER THAN 100.4 F (38 C) OR HIGHER *CHILLS OR SWEATING *NAUSEA AND VOMITING THAT IS NOT CONTROLLED WITH YOUR NAUSEA MEDICATION *UNUSUAL SHORTNESS OF BREATH *UNUSUAL BRUISING OR BLEEDING *URINARY PROBLEMS (pain or burning when urinating, or frequent urination) *BOWEL PROBLEMS (unusual diarrhea, constipation, pain near the anus) TENDERNESS IN MOUTH AND THROAT WITH OR WITHOUT PRESENCE OF ULCERS (sore throat, sores in mouth, or a toothache) UNUSUAL RASH, SWELLING OR PAIN  UNUSUAL VAGINAL DISCHARGE OR ITCHING   Items with * indicate a potential emergency and should be followed up as soon as possible or go to the Emergency Department if any problems should occur.  Please show the CHEMOTHERAPY ALERT CARD or IMMUNOTHERAPY ALERT CARD at check-in to the Emergency  Department and triage nurse.  Should you have questions after your visit or need to cancel or reschedule your appointment, please contact [facility] [phone]  and follow the prompts.  Office hours are 8:00 a.m. to 4:30 p.m. Monday - Friday. Please note that voicemails left after 4:00 p.m. may not be returned until the following business day.  We are closed weekends and major holidays. You have access to a nurse at all times for urgent questions. Please call the main number to the clinic [phone] and follow the prompts.  For any non-urgent questions, you may also contact your provider using MyChart. We now offer e-Visits for anyone 3 and older to request care online for non-urgent symptoms. For details visit [email or web address].   Also download the MyChart app! Go to the app store, search "MyChart", open the app, select [facility], and log in with your MyChart username and password.  Masks are optional in the cancer centers. If you would like for your care team to wear a mask while they are taking care of you, please let them know. For doctor visits, patients may have with them one support person who is at least 78 years old. At this time, visitors are not allowed in the infusion area.

## 2022-07-10 LAB — KAPPA/LAMBDA LIGHT CHAINS
Kappa free light chain: 30.8 mg/L — ABNORMAL HIGH (ref 3.3–19.4)
Kappa, lambda light chain ratio: 1.08 (ref 0.26–1.65)
Lambda free light chains: 28.4 mg/L — ABNORMAL HIGH (ref 5.7–26.3)

## 2022-07-11 ENCOUNTER — Non-Acute Institutional Stay (SKILLED_NURSING_FACILITY): Payer: Medicare PPO | Admitting: Adult Health

## 2022-07-11 ENCOUNTER — Encounter: Payer: Self-pay | Admitting: Adult Health

## 2022-07-11 DIAGNOSIS — I700 Atherosclerosis of aorta: Secondary | ICD-10-CM | POA: Diagnosis not present

## 2022-07-11 DIAGNOSIS — N183 Chronic kidney disease, stage 3 unspecified: Secondary | ICD-10-CM | POA: Diagnosis not present

## 2022-07-11 DIAGNOSIS — E1122 Type 2 diabetes mellitus with diabetic chronic kidney disease: Secondary | ICD-10-CM

## 2022-07-11 DIAGNOSIS — I129 Hypertensive chronic kidney disease with stage 1 through stage 4 chronic kidney disease, or unspecified chronic kidney disease: Secondary | ICD-10-CM

## 2022-07-11 DIAGNOSIS — F01518 Vascular dementia, unspecified severity, with other behavioral disturbance: Secondary | ICD-10-CM

## 2022-07-11 DIAGNOSIS — N1832 Chronic kidney disease, stage 3b: Secondary | ICD-10-CM | POA: Diagnosis not present

## 2022-07-11 NOTE — Progress Notes (Signed)
Location:  [place] Room Number: [place] of Service:  SNF (31) Provider:  Ok Edwards, NP   CODE STATUS: DNR  Allergies  Allergen Reactions   Lotensin [Benazepril]     Chief Complaint  Patient presents with   Acute Visit    Care plan meeting    HPI:  We have come together for her care plan meeting. BIMS 5/15 mood 0/30. She has an unsteady gait with no falls. She requires limited to extensive assist with her adls. Frequently incontinent of bladder and bowel. Dietary: weight is 121 pounds; D2 diet feeds self good appetite. Therapy: none at this time. She does attend activities does need assist. Has poor attention span. She will continue to be followed for her chronic illnesses including: Aortic atherosclerosis Hypertension associated with stage 3b chronic kidney disease due to type 2 diabetes mellitus CKD stage 3 due to type 2 diabetes mellitus  Vascular dementia with behavioral disturbance  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HCC)    Dementia ([place])    Diabetes mellitus with CKD    H/O partial nephrectomy    Hypokalemia    Hypothyroidism    Impaired cognition    Multiple myeloma (HCC)    Dr Maylon Peppers, [facility]    Past Surgical History:  Procedure Laterality Date   ABDOMINAL HYSTERECTOMY     CRANIOTOMY     for lymphoma   YAG LASER APPLICATION Right 0/37/0488   Procedure: YAG LASER APPLICATION;  Surgeon: [name], MD;  Location: [facility];  Service: Ophthalmology;  Laterality: Right;    Social History   Socioeconomic History   Marital status: Single    Spouse name: Not on file   Number of children: Not on file   Years of education: Not on file   Highest education level: Not on file  Occupational History   Occupation: retired   Tobacco Use   Smoking status: Never   Smokeless tobacco: Never  Vaping Use   Vaping Use: Never used  Substance and Sexual Activity    Alcohol use: No   Drug use: No   Sexual activity: Never  Other Topics Concern   Not on file  Social History Narrative   Long term resident of [facility]    Social Determinants of Health   Financial Resource Strain: [place]  (10/16/2020)   Overall Financial Resource Strain (CARDIA)    Difficulty of Paying Living Expenses: Not very hard  Food Insecurity: No Food Insecurity (10/16/2020)   Hunger Vital Sign    Worried About Running Out of Food in the Last Year: Never true    Ran Out of Food in the Last Year: Never true  Transportation Needs: No Transportation Needs (10/16/2020)   PRAPARE - Hydrologist (Medical): No    Lack of Transportation (Non-Medical): No  Physical Activity: Inactive (10/16/2020)   Exercise Vital Sign    Days of Exercise per Week: 0 days    Minutes of Exercise per Session: 0 min  Stress: No Stress Concern Present (10/16/2020)   Jay    Feeling of Stress : Not at all  Social Connections: Moderately Isolated (10/16/2020)   Social Connection and Isolation Panel [NHANES]    Frequency of Communication with Friends and Family: More than three times a week    Frequency of Social Gatherings  with Friends and Family: Once a week    Attends Religious Services: More than 4 times per year    Active Member of Clubs or Organizations: No    Attends Music therapist: Never    Marital Status: Never married  Human resources officer Violence: Not on file   Family History  Problem Relation Age of Onset   Depression Mother    Diabetes Mother    Heart disease Father    Cancer - Prostate Brother    Cancer Brother    Cancer Sister       VITAL SIGNS BP 120/65   Pulse 73   Temp (!) 97.5 F (36.4 C)   Resp 18   Ht $R'5\' 9"'sQ$  (1.753 m)   Wt 121 lb 3.2 oz (55 kg)   SpO2 97%   BMI 17.90 kg/m   Facility-Administered Encounter Medications as of 07/11/2022  Medication    cyanocobalamin ((VITAMIN B-12)) 1000 MCG/ML injection   cyanocobalamin ((VITAMIN B-12)) 1000 MCG/ML injection   denosumab (XGEVA) 120 MG/1.7ML injection   dexamethasone (DECADRON) 4 MG tablet   dexamethasone (DECADRON) 4 MG tablet   diphenhydrAMINE (BENADRYL) 50 MG/ML injection   diphenhydrAMINE (BENADRYL) 50 MG/ML injection   diphenhydrAMINE (BENADRYL) 50 MG/ML injection   epoetin alfa-epbx (RETACRIT) 81017 UNIT/ML injection   epoetin alfa-epbx (RETACRIT) 51025 UNIT/ML injection   epoetin alfa-epbx (RETACRIT) 85277 UNIT/ML injection   fulvestrant (FASLODEX) 250 MG/5ML injection   lanreotide acetate (SOMATULINE DEPOT) 120 MG/0.5ML injection   lanreotide acetate (SOMATULINE DEPOT) 120 MG/0.5ML injection   magnesium sulfate 2 GM/50ML IVPB   octreotide (SANDOSTATIN LAR) 30 MG IM injection   palonosetron (ALOXI) 0.25 MG/5ML injection   palonosetron (ALOXI) 0.25 MG/5ML injection   palonosetron (ALOXI) 0.25 MG/5ML injection   palonosetron (ALOXI) 0.25 MG/5ML injection   potassium chloride 10 MEQ/100ML IVPB   potassium chloride SA (KLOR-CON M) 20 MEQ CR tablet   potassium chloride SA (KLOR-CON M) 20 MEQ CR tablet   prochlorperazine (COMPAZINE) 10 MG tablet   prochlorperazine (COMPAZINE) 10 MG tablet   prochlorperazine (COMPAZINE) tablet 10 mg   Outpatient Encounter Medications as of 07/11/2022  Medication Sig   acetaminophen (TYLENOL) 500 MG tablet Take 1,000 mg by mouth every 6 (six) hours as needed for mild pain.   acyclovir (ZOVIRAX) 400 MG tablet TAKE 1 TABLET BY MOUTH TWICE DAILY   aspirin EC 81 MG tablet Take 81 mg by mouth daily.   busPIRone (BUSPAR) 10 MG tablet Take 10 mg by mouth 3 (three) times daily. For anxiety   Calcium Carbonate-Vit D-Min (CALCIUM 600+D PLUS MINERALS) 600-400 MG-UNIT CHEW Chew 1 tablet by mouth daily.   cholestyramine (QUESTRAN) 4 g packet Take 4 g by mouth 2 (two) times daily between meals.   Lactobacillus Probiotic TABS Take 2 tablets by mouth daily.    levothyroxine (SYNTHROID, LEVOTHROID) 25 MCG tablet Take 25 mcg by mouth daily before breakfast.   lipase/protease/amylase (CREON) 36000 UNITS CPEP capsule 36,000-114,000- 180,000 unit; amt: 1; oral Special Instructions: Take one capsule with snacks if eaten between meals. Twice A Day Between Meals as needed   loperamide (IMODIUM A-D) 2 MG tablet Take 2 mg by mouth 3 (three) times daily. For diarrhea   losartan (COZAAR) 50 MG tablet Take 1 tablet (50 mg total) by mouth daily.   NON FORMULARY Dysphagia 2 diet with thin liquids   NON FORMULARY Wanderguard #2237 to ankle for safety awareness. Check placement and function qshift. Special Instructions: Check placement and function qshift. Every Shift  Day, Evening, Night   potassium chloride SA (KLOR-CON) 20 MEQ tablet Take 20 mEq by mouth daily.    sertraline (ZOLOFT) 25 MG tablet Take 25 mg by mouth daily.     SIGNIFICANT DIAGNOSTIC EXAMS   PREVIOUS   08-10-20: t score: -3.758  03-16-22: chest x-ray:  No radiographic evidence of acute cardiopulmonary disease Mild cardiomegaly  Mild osteopenia Mild osteoarthritis   NO NEW EXAMS     LABS REVIEWED PREVIOUS    07-15-21: wbc 4.7; hgb 11.7; hct 35.9; mcv 97.0 plt 220; glucose 161; bun 21; creat 1.23; k+ 3.9; na++ 138; ca 8.8; GFR 46 liver normal albumin 3.2 uric acid 4.4 08-08-21: urine culture: 70,000 e-coli 08-26-21: wbc 6.3; hgb 11.1; hct 35.3; mcv 99.2 plt 165; glucose 117; bun 16; creat 1.02; k+ 4.0; na++ 141; ca 9.1 GFR 57; liver normal albumin 3.1  09-05-21: hgb a1c 5.9; urine micro-albumin 7.9 10-07-21: wbc 5.6; hgb 10.5; hct 32.5; mcv 100.3 plt 163; glucose 105; bun 25; creat 1.14; k+ 3.8; na++ 138; ca 8.8; GFR 50 liver normal albumin 3.2 10-10-21: chol 153; ldl 81; trig 56; hdl 61; tsh 2.577 11-11-21: wbc 4.6; hgb 10.9; hct 34.5; mcv 98.6 plt 160; glucose 183; bun 25; creat 1.23; k+ 3.8; na++ 143; ca 9.1; GFR 45; liver normal albumin 3.3; mag 2.3  12-23-21: wbc 4,1 hgb 11.9; hct 36.8;  mcv 98.1 plt 106; glucose 117; bun 20; creat 1.17; k+ 3.4; na++ 142; ca 8.9; GFR 48; liver normal albumin  3.5  mag 2.2  01-06-22: hgb a1c 5.9 03-17-22: wbc 3.6; hgb 10.9; hct 34.7; mcv 97.2 plt 121; glucose 140; bun 20; creat 1.16; k+ 3.6; na++ 144; ca 9.2 GFR 49; protein 6.7; albumin 3.4 ast 69 03-17-22: wbc 4.1; hgb 10.9; hct 32.5; mcv 96.2 plt 98; glucose 96; bbun 22; creat 1.17; k+ 3.5; na++ 142; ca 9.0; GFR 48 d-dimer 1.67; CRP 1.2  03-24-22: d-dimer 0.72 05-13-22: wbc 5.2; hgb 10.7; hct 32.6; mcv 98.5 plt 148; glucose 85; bun 21; creat 1.31; k+ 3.4; na++ 141; ca 9.0; gfr 42; protein 6.5; albumin 3.3  05-15-22: hgb a1c 5.4 05-20-22: wbc 4.5; hgb 11.6; hct 35.6; mcv 97.8 plt 124 06-09-22: wbc 4.7; hgb 10.3; hct 32.0; mcv 97.6 plt 164; glucose 94; bun 23; creat 1.11; k+ 3.8; na++ 141; ca 9.1; gfr 51 protein 6.2 albumin 3.2 tah 3.485 06-25-22: wbc 4.2; hgb 10.7; hct 33.8; mcv 98.3 plt 110; glucose 97; bun 21; creat 1.10; k+ 3.6; na++ 140; ca 8.9; gfr 52; protein 6.4; albumin 3.3   NO NEW LABS.   Review of Systems  Constitutional:  Negative for malaise/fatigue.  Respiratory:  Negative for cough and shortness of breath.   Cardiovascular:  Negative for chest pain, palpitations and leg swelling.  Gastrointestinal:  Negative for abdominal pain, constipation and heartburn.  Musculoskeletal:  Negative for back pain, joint pain and myalgias.  Skin: Negative.   Neurological:  Negative for dizziness.  Psychiatric/Behavioral:  The patient is not nervous/anxious.    Physical Exam Constitutional:      General: She is not in acute distress.    Appearance: She is well-developed. She is not diaphoretic.  Neck:     Thyroid: No thyromegaly.  Cardiovascular:     Rate and Rhythm: Normal rate and regular rhythm.     Pulses: Normal pulses.     Heart sounds: Normal heart sounds.  Pulmonary:     Effort: Pulmonary effort is normal. No respiratory distress.     Breath  sounds: Normal breath sounds.  Abdominal:      General: Bowel sounds are normal. There is no distension.     Palpations: Abdomen is soft.     Tenderness: There is no abdominal tenderness.  Musculoskeletal:        General: Normal range of motion.     Cervical back: Neck supple.     Right lower leg: No edema.     Left lower leg: No edema.  Lymphadenopathy:     Cervical: No cervical adenopathy.  Skin:    General: Skin is warm and dry.  Neurological:     Mental Status: She is alert. Mental status is at baseline.  Psychiatric:        Mood and Affect: Mood normal.       ASSESSMENT/ PLAN:  TODAY  Aortic atherosclerosis Hypertension associated with stage 3b chronic kidney disease due to type 2 diabetes mellitus CKD stage 3 due to type 2 diabetes mellitus Vascular dementia with behavioral disturbance  Will continue current medications Will continue current plan of care Will continue to monitor her status.    Time spent with patient: 40 minutes: plan of care; medications; dietary.    Ok Edwards NP [facility]   call [phone]

## 2022-07-14 LAB — PROTEIN ELECTROPHORESIS, SERUM
A/G Ratio: 1 (ref 0.7–1.7)
Albumin ELP: 3.2 g/dL (ref 2.9–4.4)
Alpha-1-Globulin: 0.2 g/dL (ref 0.0–0.4)
Alpha-2-Globulin: 0.8 g/dL (ref 0.4–1.0)
Beta Globulin: 0.9 g/dL (ref 0.7–1.3)
Gamma Globulin: 1.2 g/dL (ref 0.4–1.8)
Globulin, Total: 3.1 g/dL (ref 2.2–3.9)
M-Spike, %: 0.5 g/dL — ABNORMAL HIGH
Total Protein ELP: 6.3 g/dL (ref 6.0–8.5)

## 2022-07-15 ENCOUNTER — Other Ambulatory Visit: Payer: Self-pay

## 2022-07-16 ENCOUNTER — Inpatient Hospital Stay: Payer: Medicare PPO | Attending: Hematology

## 2022-07-16 ENCOUNTER — Inpatient Hospital Stay: Payer: Medicare PPO

## 2022-07-16 VITALS — BP 131/83 | HR 69 | Temp 96.7°F | Resp 18 | Wt 117.5 lb

## 2022-07-16 DIAGNOSIS — C9000 Multiple myeloma not having achieved remission: Secondary | ICD-10-CM

## 2022-07-16 DIAGNOSIS — Z5112 Encounter for antineoplastic immunotherapy: Secondary | ICD-10-CM | POA: Diagnosis not present

## 2022-07-16 DIAGNOSIS — D509 Iron deficiency anemia, unspecified: Secondary | ICD-10-CM

## 2022-07-16 LAB — COMPREHENSIVE METABOLIC PANEL
ALT: 24 U/L (ref 0–44)
AST: 36 U/L (ref 15–41)
Albumin: 3.5 g/dL (ref 3.5–5.0)
Alkaline Phosphatase: 74 U/L (ref 38–126)
Anion gap: 8 (ref 5–15)
BUN: 24 mg/dL — ABNORMAL HIGH (ref 8–23)
CO2: 26 mmol/L (ref 22–32)
Calcium: 8.9 mg/dL (ref 8.9–10.3)
Chloride: 107 mmol/L (ref 98–111)
Creatinine, Ser: 1.29 mg/dL — ABNORMAL HIGH (ref 0.44–1.00)
GFR, Estimated: 43 mL/min — ABNORMAL LOW (ref >60)
Glucose, Bld: 93 mg/dL (ref 70–99)
Potassium: 3.7 mmol/L (ref 3.5–5.1)
Sodium: 141 mmol/L (ref 135–145)
Total Bilirubin: 0.5 mg/dL (ref 0.3–1.2)
Total Protein: 7 g/dL (ref 6.5–8.1)

## 2022-07-16 LAB — CBC WITH DIFFERENTIAL/PLATELET
Abs Immature Granulocytes: 0.01 10*3/uL (ref 0.00–0.07)
Basophils Absolute: 0 10*3/uL (ref 0.0–0.1)
Basophils Relative: 1 %
Eosinophils Absolute: 0.1 10*3/uL (ref 0.0–0.5)
Eosinophils Relative: 3 %
HCT: 35.4 % — ABNORMAL LOW (ref 36.0–46.0)
Hemoglobin: 11.6 g/dL — ABNORMAL LOW (ref 12.0–15.0)
Immature Granulocytes: 0 %
Lymphocytes Relative: 16 %
Lymphs Abs: 0.6 10*3/uL — ABNORMAL LOW (ref 0.7–4.0)
MCH: 32 pg (ref 26.0–34.0)
MCHC: 32.8 g/dL (ref 30.0–36.0)
MCV: 97.5 fL (ref 80.0–100.0)
Monocytes Absolute: 0.4 10*3/uL (ref 0.1–1.0)
Monocytes Relative: 10 %
Neutro Abs: 2.8 10*3/uL (ref 1.7–7.7)
Neutrophils Relative %: 70 %
Platelets: 116 10*3/uL — ABNORMAL LOW (ref 150–400)
RBC: 3.63 MIL/uL — ABNORMAL LOW (ref 3.87–5.11)
RDW: 16.2 % — ABNORMAL HIGH (ref 11.5–15.5)
WBC: 4 10*3/uL (ref 4.0–10.5)
nRBC: 0 % (ref 0.0–0.2)

## 2022-07-16 LAB — MAGNESIUM: Magnesium: 2.3 mg/dL (ref 1.7–2.4)

## 2022-07-16 MED ORDER — SODIUM CHLORIDE 0.9 % IV SOLN
400.0000 mg | Freq: Once | INTRAVENOUS | Status: AC
  Administered 2022-07-16: 400 mg via INTRAVENOUS
  Filled 2022-07-16: qty 20

## 2022-07-16 MED ORDER — LOPERAMIDE HCL 2 MG PO CAPS
2.0000 mg | ORAL_CAPSULE | Freq: Once | ORAL | Status: AC
  Administered 2022-07-16: 2 mg via ORAL
  Filled 2022-07-16: qty 1

## 2022-07-16 MED ORDER — DEXAMETHASONE 4 MG PO TABS
10.0000 mg | ORAL_TABLET | Freq: Once | ORAL | Status: AC
  Administered 2022-07-16: 10 mg via ORAL
  Filled 2022-07-16: qty 3

## 2022-07-16 MED ORDER — LORATADINE 10 MG PO TABS
10.0000 mg | ORAL_TABLET | Freq: Once | ORAL | Status: AC
  Administered 2022-07-16: 10 mg via ORAL
  Filled 2022-07-16: qty 1

## 2022-07-16 MED ORDER — SODIUM CHLORIDE 0.9 % IV SOLN: Freq: Once | INTRAVENOUS | Status: AC

## 2022-07-16 MED ORDER — LOPERAMIDE HCL 2 MG PO CAPS
2.0000 mg | ORAL_CAPSULE | Freq: Once | ORAL | Status: AC
  Administered 2022-07-16: 2 mg via ORAL

## 2022-07-16 MED ORDER — BORTEZOMIB CHEMO SQ INJECTION 3.5 MG (2.5MG/ML)
1.3000 mg/m2 | Freq: Once | INTRAMUSCULAR | Status: AC
  Administered 2022-07-16: 2 mg via SUBCUTANEOUS
  Filled 2022-07-16: qty 0.8

## 2022-07-16 MED ORDER — ACETAMINOPHEN 325 MG PO TABS
650.0000 mg | ORAL_TABLET | Freq: Once | ORAL | Status: AC
  Administered 2022-07-16: 650 mg via ORAL
  Filled 2022-07-16: qty 2

## 2022-07-16 NOTE — Progress Notes (Signed)
Pt presents today for Velcade injection per provider's order. Vital signs and labs WNL for treatment. Okay to proceed with treatment today.   Velcade given today per MD orders. Tolerated infusion without adverse affects. Vital signs stable. No complaints at this time. Discharged from clinic via whhelchair in stable condition. Alert and oriented x 3. F/U with [facility] as scheduled.

## 2022-07-16 NOTE — Progress Notes (Signed)
Pt presents today for Venofer IV iron infusion per provider's order. Vital signs stable and pt voiced no new complaints at this time.  Peripheral IV started with good blood return pre and post infusion.  Venofer 400 mg given today per MD orders. Tolerated infusion without adverse affects. Vital signs stable. No complaints at this time. Discharged from clinic ambulatory in stable condition. Alert and oriented x 3. F/U with [facility] as scheduled.   

## 2022-07-16 NOTE — Patient Instructions (Signed)
[place]  Discharge Instructions: Thank you for choosing [facility] to provide your oncology and hematology care.  If you have a lab appointment with the [facility], please come in thru the Main Entrance and check in at the main information desk.  Wear comfortable clothing and clothing appropriate for easy access to any Portacath or PICC line.   We strive to give you quality time with your provider. You may need to reschedule your appointment if you arrive late (15 or more minutes).  Arriving late affects you and other patients whose appointments are after yours.  Also, if you miss three or more appointments without notifying the office, you may be dismissed from the clinic at the provider's discretion.      For prescription refill requests, have your pharmacy contact our office and allow 72 hours for refills to be completed.    Today you received the following chemotherapy and/or immunotherapy agents Velcade   To help prevent nausea and vomiting after your treatment, we encourage you to take your nausea medication as directed.  BELOW ARE SYMPTOMS THAT SHOULD BE REPORTED IMMEDIATELY: *FEVER GREATER THAN 100.4 F (38 C) OR HIGHER *CHILLS OR SWEATING *NAUSEA AND VOMITING THAT IS NOT CONTROLLED WITH YOUR NAUSEA MEDICATION *UNUSUAL SHORTNESS OF BREATH *UNUSUAL BRUISING OR BLEEDING *URINARY PROBLEMS (pain or burning when urinating, or frequent urination) *BOWEL PROBLEMS (unusual diarrhea, constipation, pain near the anus) TENDERNESS IN MOUTH AND THROAT WITH OR WITHOUT PRESENCE OF ULCERS (sore throat, sores in mouth, or a toothache) UNUSUAL RASH, SWELLING OR PAIN  UNUSUAL VAGINAL DISCHARGE OR ITCHING   Items with * indicate a potential emergency and should be followed up as soon as possible or go to the Emergency Department if any problems should occur.  Please show the CHEMOTHERAPY ALERT CARD or IMMUNOTHERAPY ALERT CARD at check-in to the Emergency  Department and triage nurse.  Should you have questions after your visit or need to cancel or reschedule your appointment, please contact [facility] [phone]  and follow the prompts.  Office hours are 8:00 a.m. to 4:30 p.m. Monday - Friday. Please note that voicemails left after 4:00 p.m. may not be returned until the following business day.  We are closed weekends and major holidays. You have access to a nurse at all times for urgent questions. Please call the main number to the clinic [phone] and follow the prompts.  For any non-urgent questions, you may also contact your provider using MyChart. We now offer e-Visits for anyone 78 and older to request care online for non-urgent symptoms. For details visit [email or web address].   Also download the MyChart app! Go to the app store, search "MyChart", open the app, select [facility], and log in with your MyChart username and password.  Masks are optional in the cancer centers. If you would like for your care team to wear a mask while they are taking care of you, please let them know. For doctor visits, patients may have with them one support person who is at least 78 years old. At this time, visitors are not allowed in the infusion area.  Bortezomib injection What is this medication? BORTEZOMIB (bor TEZ oh mib) targets proteins in cancer cells and stops the cancer cells from growing. It treats multiple myeloma and mantle cell lymphoma. This medicine may be used for other purposes; ask your health care provider or pharmacist if you have questions. COMMON BRAND NAME(S): Velcade What should I tell my care  team before I take this medication? They need to know if you have any of these conditions: dehydration diabetes (high blood sugar) heart disease liver disease tingling of the fingers or toes or other nerve disorder an unusual or allergic reaction to bortezomib, mannitol, boron, other medicines, foods, dyes, or  preservatives pregnant or trying to get pregnant breast-feeding How should I use this medication? This medicine is injected into a vein or under the skin. It is given by a health care provider in a hospital or clinic setting. Talk to your health care provider about the use of this medicine in children. Special care may be needed. Overdosage: If you think you have taken too much of this medicine contact a poison control center or emergency room at once. NOTE: This medicine is only for you. Do not share this medicine with others. What if I miss a dose? Keep appointments for follow-up doses. It is important not to miss your dose. Call your health care provider if you are unable to keep an appointment. What may interact with this medication? This medicine may interact with the following medications: ketoconazole rifampin This list may not describe all possible interactions. Give your health care provider a list of all the medicines, herbs, non-prescription drugs, or dietary supplements you use. Also tell them if you smoke, drink alcohol, or use illegal drugs. Some items may interact with your medicine. What should I watch for while using this medication? Your condition will be monitored carefully while you are receiving this medicine. You may need blood work done while you are taking this medicine. You may get drowsy or dizzy. Do not drive, use machinery, or do anything that needs mental alertness until you know how this medicine affects you. Do not stand up or sit up quickly, especially if you are an older patient. This reduces the risk of dizzy or fainting spells This medicine may increase your risk of getting an infection. Call your health care provider for advice if you get a fever, chills, sore throat, or other symptoms of a cold or flu. Do not treat yourself. Try to avoid being around people who are sick. Check with your health care provider if you have severe diarrhea, nausea, and vomiting, or  if you sweat a lot. The loss of too much body fluid may make it dangerous for you to take this medicine. Do not become pregnant while taking this medicine or for 7 months after stopping it. Women should inform their health care provider if they wish to become pregnant or think they might be pregnant. Men should not father a child while taking this medicine and for 4 months after stopping it. There is a potential for serious harm to an unborn child. Talk to your health care provider for more information. Do not breast-feed an infant while taking this medicine or for 2 months after stopping it. This medicine may make it more difficult to get pregnant or father a child. Talk to your health care provider if you are concerned about your fertility. What side effects may I notice from receiving this medication? Side effects that you should report to your doctor or health care professional as soon as possible: allergic reactions (skin rash; itching or hives; swelling of the face, lips, or tongue) bleeding (bloody or black, tarry stools; red or dark brown urine; spitting up blood or brown material that looks like coffee grounds; red spots on the skin; unusual bruising or bleeding from the eye, gums, or nose) blurred vision  or changes in vision confusion constipation headache heart failure (trouble breathing; fast, irregular heartbeat; sudden weight gain; swelling of the ankles, feet, hands) infection (fever, chills, cough, sore throat, pain or trouble passing urine) lack or loss of appetite liver injury (dark yellow or brown urine; general ill feeling or flu-like symptoms; loss of appetite, right upper belly pain; yellowing of the eyes or skin) low blood pressure (dizziness; feeling faint or lightheaded, falls; unusually weak or tired) muscle cramps pain, redness, or irritation at site where injected pain, tingling, numbness in the hands or feet seizures trouble breathing unusual bruising or  bleeding Side effects that usually do not require medical attention (report to your doctor or health care professional if they continue or are bothersome): diarrhea nausea stomach pain trouble sleeping vomiting This list may not describe all possible side effects. Call your doctor for medical advice about side effects. You may report side effects to FDA at 1-800-FDA-1088. Where should I keep my medication? This medicine is given in a hospital or clinic. It will not be stored at home. NOTE: This sheet is a summary. It may not cover all possible information. If you have questions about this medicine, talk to your doctor, pharmacist, or health care provider.  2023 Elsevier/Gold Standard (2020-11-22 00:00:00)

## 2022-07-17 ENCOUNTER — Encounter: Payer: Self-pay | Admitting: Adult Health

## 2022-07-17 ENCOUNTER — Non-Acute Institutional Stay (SKILLED_NURSING_FACILITY): Payer: Medicare PPO | Admitting: Adult Health

## 2022-07-17 DIAGNOSIS — K8689 Other specified diseases of pancreas: Secondary | ICD-10-CM

## 2022-07-17 DIAGNOSIS — K529 Noninfective gastroenteritis and colitis, unspecified: Secondary | ICD-10-CM | POA: Diagnosis not present

## 2022-07-17 MED ORDER — LANREOTIDE ACETATE 120 MG/0.5ML ~~LOC~~ SOLN
SUBCUTANEOUS | Status: AC
  Filled 2022-07-17: qty 120

## 2022-07-17 NOTE — Progress Notes (Signed)
Location:  [facility] Room Number: 148 Place of Service:  SNF (31)   CODE STATUS: dnr  Allergies  Allergen Reactions   Lotensin [Benazepril]     Chief Complaint  Patient presents with   Acute Visit    Diarrhea     HPI:  She has a history of pancreatic insufficiency and chronic diarrhea. She is being treated with creon; imodium and questran. Staff reports that she is having increasing loose stools present. She denies any abdominal pain. There are no reports of fevers present.   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[place])    Dementia ([place])    Diabetes mellitus with CKD    H/O partial nephrectomy    Hypokalemia    Hypothyroidism    Impaired cognition    Multiple myeloma (HCC)    Dr Maylon Peppers, [facility]    Past Surgical History:  Procedure Laterality Date   ABDOMINAL HYSTERECTOMY     CRANIOTOMY     for lymphoma   YAG LASER APPLICATION Right 0/22/3361   Procedure: YAG LASER APPLICATION;  Surgeon: [name], MD;  Location: [facility];  Service: Ophthalmology;  Laterality: Right;    Social History   Socioeconomic History   Marital status: Single    Spouse name: Not on file   Number of children: Not on file   Years of education: Not on file   Highest education level: Not on file  Occupational History   Occupation: retired   Tobacco Use   Smoking status: Never   Smokeless tobacco: Never  Vaping Use   Vaping Use: Never used  Substance and Sexual Activity   Alcohol use: No   Drug use: No   Sexual activity: Never  Other Topics Concern   Not on file  Social History Narrative   Long term resident of [facility]    Social Determinants of Health   Financial Resource Strain: [place]  (10/16/2020)   Overall Financial Resource Strain (CARDIA)    Difficulty of Paying Living Expenses: Not very hard  Food Insecurity: No Food Insecurity (10/16/2020)   Hunger Vital Sign    Worried  About Running Out of Food in the Last Year: Never true    Ran Out of Food in the Last Year: Never true  Transportation Needs: No Transportation Needs (10/16/2020)   PRAPARE - Hydrologist (Medical): No    Lack of Transportation (Non-Medical): No  Physical Activity: Inactive (10/16/2020)   Exercise Vital Sign    Days of Exercise per Week: 0 days    Minutes of Exercise per Session: 0 min  Stress: No Stress Concern Present (10/16/2020)   [place]    Feeling of Stress : Not at all  Social Connections: Moderately Isolated (10/16/2020)   Social Connection and Isolation Panel [NHANES]    Frequency of Communication with Friends and Family: More than three times a week    Frequency of Social Gatherings with Friends and Family: Once a week    Attends Religious Services: More than 4 times per year    Active Member of [employer] or Organizations: No    Attends Archivist Meetings: Never    Marital Status: Never married  Human resources officer Violence: Not on file   Family History  Problem Relation Age of Onset   Depression Mother  Diabetes Mother    Heart disease Father    Cancer - Prostate Brother    Cancer Brother    Cancer Sister       VITAL SIGNS BP 123/70   Pulse 66   Temp 99.3 F (37.4 C)   Resp 18   Ht _0  (1.753 m)   Wt 121 lb 3.2 oz (55 kg)   SpO2 97%   BMI 17.90 kg/m    Outpatient Encounter Medications as of 07/17/2022  Medication Sig   acetaminophen (TYLENOL) 500 MG tablet Take 1,000 mg by mouth every 6 (six) hours as needed for mild pain.   acyclovir (ZOVIRAX) 400 MG tablet TAKE 1 TABLET BY MOUTH TWICE DAILY   alendronate (FOSAMAX) 70 MG tablet Take 70 mg by mouth once a week. Take with a full glass of water on an empty stomach.   aspirin EC 81 MG tablet Take 81 mg by mouth daily.   busPIRone (BUSPAR) 10 MG tablet Take 10 mg by mouth 3 (three) times daily. For anxiety    Calcium Carbonate-Vit D-Min (CALCIUM 600+D PLUS MINERALS) 600-400 MG-UNIT CHEW Chew 1 tablet by mouth daily.   cholestyramine (QUESTRAN) 4 g packet Take 4 g by mouth 2 (two) times daily between meals.   Lactobacillus Probiotic TABS Take 2 tablets by mouth daily.   levothyroxine (SYNTHROID, LEVOTHROID) 25 MCG tablet Take 25 mcg by mouth daily before breakfast.   lipase/protease/amylase (CREON) 36000 UNITS CPEP capsule 36,000-114,000- 180,000 unit; amt: 1; oral Special Instructions: Take one capsule with snacks if eaten between meals. Twice A Day Between Meals as needed   lipase/protease/amylase (CREON) 36000 UNITS CPEP capsule Take 2 capsules by mouth 3 (three) times daily with meals.   loperamide (IMODIUM A-D) 2 MG tablet Take 2 mg by mouth 3 (three) times daily. For diarrhea   losartan (COZAAR) 50 MG tablet Take 1 tablet (50 mg total) by mouth daily.   NON FORMULARY Dysphagia 2 diet with thin liquids   NON FORMULARY Wanderguard #2237 to ankle for safety awareness. Check placement and function qshift. Special Instructions: Check placement and function qshift. Every Shift Day, Evening, Night   potassium chloride SA (KLOR-CON) 20 MEQ tablet Take 20 mEq by mouth daily.    sertraline (ZOLOFT) 25 MG tablet Take 25 mg by mouth daily.     SIGNIFICANT DIAGNOSTIC EXAMS   PREVIOUS   08-10-20: t score: -3.758  03-16-22: chest x-ray:  No radiographic evidence of acute cardiopulmonary disease Mild cardiomegaly  Mild osteopenia Mild osteoarthritis   NO NEW EXAMS     LABS REVIEWED PREVIOUS    07-15-21: wbc 4.7; hgb 11.7; hct 35.9; mcv 97.0 plt 220; glucose 161; bun 21; creat 1.23; k+ 3.9; na++ 138; ca 8.8; GFR 46 liver normal albumin 3.2 uric acid 4.4 08-08-21: urine culture: 70,000 e-coli 08-26-21: wbc 6.3; hgb 11.1; hct 35.3; mcv 99.2 plt 165; glucose 117; bun 16; creat 1.02; k+ 4.0; na++ 141; ca 9.1 GFR 57; liver normal albumin 3.1  09-05-21: hgb a1c 5.9; urine micro-albumin 7.9 10-07-21: wbc  5.6; hgb 10.5; hct 32.5; mcv 100.3 plt 163; glucose 105; bun 25; creat 1.14; k+ 3.8; na++ 138; ca 8.8; GFR 50 liver normal albumin 3.2 10-10-21: chol 153; ldl 81; trig 56; hdl 61; tsh 2.577 11-11-21: wbc 4.6; hgb 10.9; hct 34.5; mcv 98.6 plt 160; glucose 183; bun 25; creat 1.23; k+ 3.8; na++ 143; ca 9.1; GFR 45; liver normal albumin 3.3; mag 2.3  12-23-21: wbc 4,1 hgb 11.9; hct 36.8;  mcv 98.1 plt 106; glucose 117; bun 20; creat 1.17; k+ 3.4; na++ 142; ca 8.9; GFR 48; liver normal albumin  3.5  mag 2.2  01-06-22: hgb a1c 5.9 03-17-22: wbc 3.6; hgb 10.9; hct 34.7; mcv 97.2 plt 121; glucose 140; bun 20; creat 1.16; k+ 3.6; na++ 144; ca 9.2 GFR 49; protein 6.7; albumin 3.4 ast 69 03-17-22: wbc 4.1; hgb 10.9; hct 32.5; mcv 96.2 plt 98; glucose 96; bbun 22; creat 1.17; k+ 3.5; na++ 142; ca 9.0; GFR 48 d-dimer 1.67; CRP 1.2  03-24-22: d-dimer 0.72 05-13-22: wbc 5.2; hgb 10.7; hct 32.6; mcv 98.5 plt 148; glucose 85; bun 21; creat 1.31; k+ 3.4; na++ 141; ca 9.0; gfr 42; protein 6.5; albumin 3.3  05-15-22: hgb a1c 5.4 05-20-22: wbc 4.5; hgb 11.6; hct 35.6; mcv 97.8 plt 124 06-09-22: wbc 4.7; hgb 10.3; hct 32.0; mcv 97.6 plt 164; glucose 94; bun 23; creat 1.11; k+ 3.8; na++ 141; ca 9.1; gfr 51 protein 6.2 albumin 3.2 tah 3.485 06-25-22: wbc 4.2; hgb 10.7; hct 33.8; mcv 98.3 plt 110; glucose 97; bun 21; creat 1.10; k+ 3.6; na++ 140; ca 8.9; gfr 52; protein 6.4; albumin 3.3   NO NEW LABS.   Review of Systems  Constitutional:  Negative for malaise/fatigue.  Respiratory:  Negative for cough and shortness of breath.   Cardiovascular:  Negative for chest pain, palpitations and leg swelling.  Gastrointestinal:  Positive for diarrhea. Negative for abdominal pain and heartburn.       Staff states   Musculoskeletal:  Negative for back pain, joint pain and myalgias.  Skin: Negative.   Neurological:  Negative for dizziness.  Psychiatric/Behavioral:  The patient is not nervous/anxious.    Physical Exam Constitutional:       General: She is not in acute distress.    Appearance: She is underweight. She is not diaphoretic.  Neck:     Thyroid: No thyromegaly.  Cardiovascular:     Rate and Rhythm: Normal rate and regular rhythm.     Pulses: Normal pulses.     Heart sounds: Normal heart sounds.  Pulmonary:     Effort: Pulmonary effort is normal. No respiratory distress.     Breath sounds: Normal breath sounds.  Abdominal:     General: Bowel sounds are normal. There is no distension.     Palpations: Abdomen is soft.     Tenderness: There is no abdominal tenderness.  Musculoskeletal:        General: Normal range of motion.     Cervical back: Neck supple.     Right lower leg: No edema.     Left lower leg: No edema.  Lymphadenopathy:     Cervical: No cervical adenopathy.  Skin:    General: Skin is warm and dry.  Neurological:     Mental Status: She is alert. Mental status is at baseline.  Psychiatric:        Mood and Affect: Mood normal.       ASSESSMENT/ PLAN:  TODAY  Pancreatic insufficiency Chronic diarrhea  Will increase her creon to 3 capsules with meals; this will max out her dosage; if she continue to have diarrhea will begin increasing her Antonito NP [facility]  call [phone]

## 2022-07-20 ENCOUNTER — Other Ambulatory Visit: Payer: Self-pay

## 2022-07-23 ENCOUNTER — Inpatient Hospital Stay: Payer: Medicare PPO

## 2022-07-23 VITALS — BP 121/75 | HR 71 | Temp 96.2°F | Resp 18 | Ht 69.0 in | Wt 113.6 lb

## 2022-07-23 DIAGNOSIS — Z5112 Encounter for antineoplastic immunotherapy: Secondary | ICD-10-CM | POA: Diagnosis not present

## 2022-07-23 DIAGNOSIS — C9000 Multiple myeloma not having achieved remission: Secondary | ICD-10-CM

## 2022-07-23 LAB — CBC WITH DIFFERENTIAL/PLATELET
Abs Immature Granulocytes: 0.01 10*3/uL (ref 0.00–0.07)
Basophils Absolute: 0 10*3/uL (ref 0.0–0.1)
Basophils Relative: 1 %
Eosinophils Absolute: 0.1 10*3/uL (ref 0.0–0.5)
Eosinophils Relative: 3 %
HCT: 36.6 % (ref 36.0–46.0)
Hemoglobin: 11.9 g/dL — ABNORMAL LOW (ref 12.0–15.0)
Immature Granulocytes: 0 %
Lymphocytes Relative: 12 %
Lymphs Abs: 0.5 10*3/uL — ABNORMAL LOW (ref 0.7–4.0)
MCH: 31.7 pg (ref 26.0–34.0)
MCHC: 32.5 g/dL (ref 30.0–36.0)
MCV: 97.6 fL (ref 80.0–100.0)
Monocytes Absolute: 0.3 10*3/uL (ref 0.1–1.0)
Monocytes Relative: 6 %
Neutro Abs: 3.3 10*3/uL (ref 1.7–7.7)
Neutrophils Relative %: 78 %
Platelets: 79 10*3/uL — ABNORMAL LOW (ref 150–400)
RBC: 3.75 MIL/uL — ABNORMAL LOW (ref 3.87–5.11)
RDW: 16.6 % — ABNORMAL HIGH (ref 11.5–15.5)
WBC: 4.2 10*3/uL (ref 4.0–10.5)
nRBC: 0 % (ref 0.0–0.2)

## 2022-07-23 LAB — COMPREHENSIVE METABOLIC PANEL
ALT: 28 U/L (ref 0–44)
AST: 34 U/L (ref 15–41)
Albumin: 3.5 g/dL (ref 3.5–5.0)
Alkaline Phosphatase: 76 U/L (ref 38–126)
Anion gap: 6 (ref 5–15)
BUN: 21 mg/dL (ref 8–23)
CO2: 25 mmol/L (ref 22–32)
Calcium: 8.9 mg/dL (ref 8.9–10.3)
Chloride: 112 mmol/L — ABNORMAL HIGH (ref 98–111)
Creatinine, Ser: 1.1 mg/dL — ABNORMAL HIGH (ref 0.44–1.00)
GFR, Estimated: 52 mL/min — ABNORMAL LOW (ref >60)
Glucose, Bld: 169 mg/dL — ABNORMAL HIGH (ref 70–99)
Potassium: 3.8 mmol/L (ref 3.5–5.1)
Sodium: 143 mmol/L (ref 135–145)
Total Bilirubin: 0.8 mg/dL (ref 0.3–1.2)
Total Protein: 7 g/dL (ref 6.5–8.1)

## 2022-07-23 LAB — MAGNESIUM: Magnesium: 2.2 mg/dL (ref 1.7–2.4)

## 2022-07-23 MED ORDER — BORTEZOMIB CHEMO SQ INJECTION 3.5 MG (2.5MG/ML)
1.3000 mg/m2 | Freq: Once | INTRAMUSCULAR | Status: AC
  Administered 2022-07-23: 2 mg via SUBCUTANEOUS
  Filled 2022-07-23: qty 0.8

## 2022-07-23 MED ORDER — DEXAMETHASONE 4 MG PO TABS
10.0000 mg | ORAL_TABLET | Freq: Once | ORAL | Status: AC
  Administered 2022-07-23: 10 mg via ORAL
  Filled 2022-07-23: qty 3

## 2022-07-23 NOTE — Progress Notes (Signed)
Patient presents today for Velcade injection per providers order.  Vital signs within parameters for treatment.  Patient has no new complains at this time. Labs within parameters.    Amanda Wu presents today for Velcade injection per the provider's orders.  Stable during administration without incident; injection site WNL; see MAR for injection details.  Patient tolerated procedure well and without incident.  No questions or complaints noted at this time.

## 2022-07-23 NOTE — Patient Instructions (Signed)
[facility]  Discharge Instructions: Thank you for choosing [facility] to provide your oncology and hematology care.  If you have a lab appointment with the [facility], please come in thru the Main Entrance and check in at the main information desk.  Wear comfortable clothing and clothing appropriate for easy access to any Portacath or PICC line.   We strive to give you quality time with your provider. You may need to reschedule your appointment if you arrive late (15 or more minutes).  Arriving late affects you and other patients whose appointments are after yours.  Also, if you miss three or more appointments without notifying the office, you may be dismissed from the clinic at the provider's discretion.      For prescription refill requests, have your pharmacy contact our office and allow 72 hours for refills to be completed.    Today you received the following chemotherapy and/or immunotherapy agents Velcade      To help prevent nausea and vomiting after your treatment, we encourage you to take your nausea medication as directed.  BELOW ARE SYMPTOMS THAT SHOULD BE REPORTED IMMEDIATELY: *FEVER GREATER THAN 100.4 F (38 C) OR HIGHER *CHILLS OR SWEATING *NAUSEA AND VOMITING THAT IS NOT CONTROLLED WITH YOUR NAUSEA MEDICATION *UNUSUAL SHORTNESS OF BREATH *UNUSUAL BRUISING OR BLEEDING *URINARY PROBLEMS (pain or burning when urinating, or frequent urination) *BOWEL PROBLEMS (unusual diarrhea, constipation, pain near the anus) TENDERNESS IN MOUTH AND THROAT WITH OR WITHOUT PRESENCE OF ULCERS (sore throat, sores in mouth, or a toothache) UNUSUAL RASH, SWELLING OR PAIN  UNUSUAL VAGINAL DISCHARGE OR ITCHING   Items with * indicate a potential emergency and should be followed up as soon as possible or go to the Emergency Department if any problems should occur.  Please show the CHEMOTHERAPY ALERT CARD or IMMUNOTHERAPY ALERT CARD at check-in to the Emergency  Department and triage nurse.  Should you have questions after your visit or need to cancel or reschedule your appointment, please contact [facility] [phone]  and follow the prompts.  Office hours are 8:00 a.m. to 4:30 p.m. Monday - Friday. Please note that voicemails left after 4:00 p.m. may not be returned until the following business day.  We are closed weekends and major holidays. You have access to a nurse at all times for urgent questions. Please call the main number to the clinic [phone] and follow the prompts.  For any non-urgent questions, you may also contact your provider using MyChart. We now offer e-Visits for anyone 88 and older to request care online for non-urgent symptoms. For details visit [email or web address].   Also download the MyChart app! Go to the app store, search "MyChart", open the app, select [facility], and log in with your MyChart username and password.  Masks are optional in the cancer centers. If you would like for your care team to wear a mask while they are taking care of you, please let them know. For doctor visits, patients may have with them one support person who is at least 78 years old. At this time, visitors are not allowed in the infusion area.

## 2022-08-05 ENCOUNTER — Encounter: Payer: Self-pay | Admitting: Adult Health

## 2022-08-05 NOTE — Progress Notes (Signed)
This encounter was created in error - please disregard.

## 2022-08-05 NOTE — Progress Notes (Signed)
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[place])    Dementia ([place])    Diabetes mellitus with CKD    H/O partial nephrectomy    Hypokalemia    Hypothyroidism    Impaired cognition    Multiple myeloma ([place])    Dr Maylon Peppers, [facility]    Past Surgical History:  Procedure Laterality Date   ABDOMINAL HYSTERECTOMY     CRANIOTOMY     for lymphoma   YAG LASER APPLICATION Right 5/32/0233   Procedure: YAG LASER APPLICATION;  Surgeon: [name], MD;  Location: [facility];  Service: Ophthalmology;  Laterality: Right;    Social History   Socioeconomic History   Marital status: Single    Spouse name: Not on file   Number of children: Not on file   Years of education: Not on file   Highest education level: Not on file  Occupational History   Occupation: retired   Tobacco Use   Smoking status: Never   Smokeless tobacco: Never  Vaping Use   Vaping Use: Never used  Substance and Sexual Activity   Alcohol use: No   Drug use: No   Sexual activity: Never  Other Topics Concern   Not on file  Social History Narrative   Long term resident of [facility]    Social Determinants of Health   Financial Resource Strain: [place]  (10/16/2020)   Overall Financial Resource Strain (CARDIA)    Difficulty of Paying Living Expenses: Not very hard  Food Insecurity: No Food Insecurity (10/16/2020)   Hunger Vital Sign    Worried About Running Out of Food in the Last Year: Never true    Ran Out of Food in the Last Year: Never true  Transportation Needs: No  Transportation Needs (10/16/2020)   PRAPARE - Hydrologist (Medical): No    Lack of Transportation (Non-Medical): No  Physical Activity: Inactive (10/16/2020)   Exercise Vital Sign    Days of Exercise per Week: 0 days    Minutes of Exercise per Session: 0 min  Stress: No Stress Concern Present (10/16/2020)   [place]    Feeling of Stress : Not at all  Social Connections: Moderately Isolated (10/16/2020)   Social Connection and Isolation Panel [NHANES]    Frequency of Communication with Friends and Family: More than three times a week    Frequency of Social Gatherings with Friends and Family: Once a week    Attends Religious Services: More than 4 times per year    Active Member of [employer] or Organizations: No    Attends Archivist Meetings: Never    Marital Status: Never married  Human resources officer Violence: Not on file   Family History  Problem Relation Age of Onset   Depression Mother    Diabetes Mother    Heart disease Father    Cancer - Prostate Brother    Cancer  Brother    Cancer Sister       VITAL SIGNS BP 122/65   Pulse 76   Temp 98.3 F (36.8 C)   Resp 16   Ht '5\' 9"'  (1.753 m)   Wt 115 lb 3.2 oz (52.3 kg)   SpO2 98%   BMI 17.01 kg/m   Facility-Administered Encounter Medications as of 08/05/2022  Medication   cyanocobalamin ((VITAMIN B-12)) 1000 MCG/ML injection   cyanocobalamin ((VITAMIN B-12)) 1000 MCG/ML injection   denosumab (XGEVA) 120 MG/1.7ML injection   dexamethasone (DECADRON) 4 MG tablet   dexamethasone (DECADRON) 4 MG tablet   diphenhydrAMINE (BENADRYL) 50 MG/ML injection   diphenhydrAMINE (BENADRYL) 50 MG/ML injection   diphenhydrAMINE (BENADRYL) 50 MG/ML injection   epoetin alfa-epbx (RETACRIT) 18563 UNIT/ML injection   epoetin alfa-epbx (RETACRIT) 14970 UNIT/ML injection   epoetin alfa-epbx (RETACRIT) 26378 UNIT/ML injection   fulvestrant  (FASLODEX) 250 MG/5ML injection   lanreotide acetate (SOMATULINE DEPOT) 120 MG/0.5ML injection   lanreotide acetate (SOMATULINE DEPOT) 120 MG/0.5ML injection   lanreotide acetate (SOMATULINE DEPOT) 120 MG/0.5ML injection   magnesium sulfate 2 GM/50ML IVPB   octreotide (SANDOSTATIN LAR) 30 MG IM injection   palonosetron (ALOXI) 0.25 MG/5ML injection   palonosetron (ALOXI) 0.25 MG/5ML injection   palonosetron (ALOXI) 0.25 MG/5ML injection   palonosetron (ALOXI) 0.25 MG/5ML injection   potassium chloride 10 MEQ/100ML IVPB   potassium chloride SA (KLOR-CON M) 20 MEQ CR tablet   potassium chloride SA (KLOR-CON M) 20 MEQ CR tablet   prochlorperazine (COMPAZINE) 10 MG tablet   prochlorperazine (COMPAZINE) 10 MG tablet   prochlorperazine (COMPAZINE) tablet 10 mg   Outpatient Encounter Medications as of 08/05/2022  Medication Sig   acetaminophen (TYLENOL) 500 MG tablet Take 1,000 mg by mouth every 6 (six) hours as needed for mild pain.   acyclovir (ZOVIRAX) 400 MG tablet TAKE 1 TABLET BY MOUTH TWICE DAILY   alendronate (FOSAMAX) 70 MG tablet Take 70 mg by mouth once a week. Take with a full glass of water on an empty stomach.   aspirin EC 81 MG tablet Take 81 mg by mouth daily.   busPIRone (BUSPAR) 10 MG tablet Take 10 mg by mouth 3 (three) times daily. For anxiety   Calcium Carbonate-Vit D-Min (CALCIUM 600+D PLUS MINERALS) 600-400 MG-UNIT CHEW Chew 1 tablet by mouth daily.   cholestyramine (QUESTRAN) 4 g packet Take 4 g by mouth 2 (two) times daily between meals.   Lactobacillus Probiotic TABS Take 2 tablets by mouth daily.   levothyroxine (SYNTHROID, LEVOTHROID) 25 MCG tablet Take 25 mcg by mouth daily before breakfast.   lipase/protease/amylase (CREON) 36000 UNITS CPEP capsule 36,000-114,000- 180,000 unit; amt: 1; oral Special Instructions: Take one capsule with snacks if eaten between meals. Twice A Day Between Meals as needed   lipase/protease/amylase (CREON) 36000 UNITS CPEP capsule Take 2  capsules by mouth 3 (three) times daily with meals.   loperamide (IMODIUM A-D) 2 MG tablet Take 2 mg by mouth 3 (three) times daily. For diarrhea   losartan (COZAAR) 50 MG tablet Take 1 tablet (50 mg total) by mouth daily.   NON FORMULARY Dysphagia 2 diet with thin liquids   NON FORMULARY Wanderguard #2237 to ankle for safety awareness. Check placement and function qshift. Special Instructions: Check placement and function qshift. Every Shift Day, Evening, Night   potassium chloride SA (KLOR-CON) 20 MEQ tablet Take 20 mEq by mouth daily.    sertraline (ZOLOFT) 25 MG tablet Take 25 mg by mouth daily.  SIGNIFICANT DIAGNOSTIC EXAMS       ASSESSMENT/ PLAN:     Ok Edwards NP [facility]  Contact [phone] Monday through Friday 8am- 5pm  After hours call [phone]  This encounter was created in error - please disregard.

## 2022-08-06 ENCOUNTER — Inpatient Hospital Stay: Payer: Medicare PPO

## 2022-08-06 VITALS — BP 130/77 | HR 70 | Temp 96.9°F | Resp 18 | Wt 117.0 lb

## 2022-08-06 DIAGNOSIS — Z5112 Encounter for antineoplastic immunotherapy: Secondary | ICD-10-CM | POA: Diagnosis not present

## 2022-08-06 DIAGNOSIS — C9000 Multiple myeloma not having achieved remission: Secondary | ICD-10-CM | POA: Diagnosis not present

## 2022-08-06 LAB — COMPREHENSIVE METABOLIC PANEL
ALT: 23 U/L (ref 0–44)
AST: 30 U/L (ref 15–41)
Albumin: 3.2 g/dL — ABNORMAL LOW (ref 3.5–5.0)
Alkaline Phosphatase: 77 U/L (ref 38–126)
Anion gap: 3 — ABNORMAL LOW (ref 5–15)
BUN: 24 mg/dL — ABNORMAL HIGH (ref 8–23)
CO2: 24 mmol/L (ref 22–32)
Calcium: 8.9 mg/dL (ref 8.9–10.3)
Chloride: 114 mmol/L — ABNORMAL HIGH (ref 98–111)
Creatinine, Ser: 1.38 mg/dL — ABNORMAL HIGH (ref 0.44–1.00)
GFR, Estimated: 39 mL/min — ABNORMAL LOW (ref >60)
Glucose, Bld: 138 mg/dL — ABNORMAL HIGH (ref 70–99)
Potassium: 4.1 mmol/L (ref 3.5–5.1)
Sodium: 141 mmol/L (ref 135–145)
Total Bilirubin: 0.7 mg/dL (ref 0.3–1.2)
Total Protein: 6.5 g/dL (ref 6.5–8.1)

## 2022-08-06 LAB — CBC WITH DIFFERENTIAL/PLATELET
Abs Immature Granulocytes: 0.01 10*3/uL (ref 0.00–0.07)
Basophils Absolute: 0 10*3/uL (ref 0.0–0.1)
Basophils Relative: 1 %
Eosinophils Absolute: 0.1 10*3/uL (ref 0.0–0.5)
Eosinophils Relative: 3 %
HCT: 34.5 % — ABNORMAL LOW (ref 36.0–46.0)
Hemoglobin: 11 g/dL — ABNORMAL LOW (ref 12.0–15.0)
Immature Granulocytes: 0 %
Lymphocytes Relative: 13 %
Lymphs Abs: 0.6 10*3/uL — ABNORMAL LOW (ref 0.7–4.0)
MCH: 31.7 pg (ref 26.0–34.0)
MCHC: 31.9 g/dL (ref 30.0–36.0)
MCV: 99.4 fL (ref 80.0–100.0)
Monocytes Absolute: 0.4 10*3/uL (ref 0.1–1.0)
Monocytes Relative: 9 %
Neutro Abs: 3.3 10*3/uL (ref 1.7–7.7)
Neutrophils Relative %: 74 %
Platelets: 130 10*3/uL — ABNORMAL LOW (ref 150–400)
RBC: 3.47 MIL/uL — ABNORMAL LOW (ref 3.87–5.11)
RDW: 16.9 % — ABNORMAL HIGH (ref 11.5–15.5)
WBC: 4.4 10*3/uL (ref 4.0–10.5)
nRBC: 0 % (ref 0.0–0.2)

## 2022-08-06 LAB — MAGNESIUM: Magnesium: 2.3 mg/dL (ref 1.7–2.4)

## 2022-08-06 MED ORDER — BORTEZOMIB CHEMO SQ INJECTION 3.5 MG (2.5MG/ML)
1.3000 mg/m2 | Freq: Once | INTRAMUSCULAR | Status: AC
  Administered 2022-08-06: 2 mg via SUBCUTANEOUS
  Filled 2022-08-06: qty 0.8

## 2022-08-06 MED ORDER — DEXAMETHASONE 4 MG PO TABS
10.0000 mg | ORAL_TABLET | Freq: Once | ORAL | Status: AC
  Administered 2022-08-06: 10 mg via ORAL
  Filled 2022-08-06: qty 3

## 2022-08-06 NOTE — Progress Notes (Signed)
Patient presents today for Velcade injection.  Patient is in satisfactory condition with no complaints voiced.  Vital signs are stable.  Labs reviewed and all labs are within treatment parameters.  We will proceed with injection per MD orders.   Patient tolerated treatment well with no complaints voiced.  Patient left via wheelchair with NT in stable condition.  Vital signs stable at discharge.  Follow up as scheduled.

## 2022-08-06 NOTE — Patient Instructions (Signed)
[facility]  Discharge Instructions: Thank you for choosing [facility] to provide your oncology and hematology care.  If you have a lab appointment with the Cancer Center, please come in thru the Main Entrance and check in at the main information desk.  Wear comfortable clothing and clothing appropriate for easy access to any Portacath or PICC line.   We strive to give you quality time with your provider. You may need to reschedule your appointment if you arrive late (15 or more minutes).  Arriving late affects you and other patients whose appointments are after yours.  Also, if you miss three or more appointments without notifying the office, you may be dismissed from the clinic at the provider's discretion.      For prescription refill requests, have your pharmacy contact our office and allow 72 hours for refills to be completed.     To help prevent nausea and vomiting after your treatment, we encourage you to take your nausea medication as directed.  BELOW ARE SYMPTOMS THAT SHOULD BE REPORTED IMMEDIATELY: *FEVER GREATER THAN 100.4 F (38 C) OR HIGHER *CHILLS OR SWEATING *NAUSEA AND VOMITING THAT IS NOT CONTROLLED WITH YOUR NAUSEA MEDICATION *UNUSUAL SHORTNESS OF BREATH *UNUSUAL BRUISING OR BLEEDING *URINARY PROBLEMS (pain or burning when urinating, or frequent urination) *BOWEL PROBLEMS (unusual diarrhea, constipation, pain near the anus) TENDERNESS IN MOUTH AND THROAT WITH OR WITHOUT PRESENCE OF ULCERS (sore throat, sores in mouth, or a toothache) UNUSUAL RASH, SWELLING OR PAIN  UNUSUAL VAGINAL DISCHARGE OR ITCHING   Items with * indicate a potential emergency and should be followed up as soon as possible or go to the Emergency Department if any problems should occur.  Please show the CHEMOTHERAPY ALERT CARD or IMMUNOTHERAPY ALERT CARD at check-in to the Emergency Department and triage nurse.  Should you have questions after your visit or need to  cancel or reschedule your appointment, please contact [facility] AT  [phone]  and follow the prompts.  Office hours are 8:00 a.m. to 4:30 p.m. Monday - Friday. Please note that voicemails left after 4:00 p.m. may not be returned until the following business day.  We are closed weekends and major holidays. You have access to a nurse at all times for urgent questions. Please call the main number to the clinic [phone] and follow the prompts.  For any non-urgent questions, you may also contact your provider using MyChart. We now offer e-Visits for anyone 18 and older to request care online for non-urgent symptoms. For details visit [email or web address].   Also download the MyChart app! Go to the app store, search "MyChart", open the app, select , and log in with your MyChart username and password.  Masks are optional in the cancer centers. If you would like for your care team to wear a mask while they are taking care of you, please let them know. You may have one support person who is at least 78 years old accompany you for your appointments.  

## 2022-08-07 ENCOUNTER — Encounter: Payer: Self-pay | Admitting: Internal Medicine

## 2022-08-07 MED ORDER — LANREOTIDE ACETATE 120 MG/0.5ML ~~LOC~~ SOLN
SUBCUTANEOUS | Status: AC
  Filled 2022-08-07: qty 120

## 2022-08-07 NOTE — Progress Notes (Signed)
This encounter was created in error - please disregard.

## 2022-08-13 ENCOUNTER — Inpatient Hospital Stay: Payer: Medicare PPO

## 2022-08-13 VITALS — BP 118/73 | HR 91 | Temp 97.9°F | Resp 18 | Wt 118.4 lb

## 2022-08-13 DIAGNOSIS — C9000 Multiple myeloma not having achieved remission: Secondary | ICD-10-CM

## 2022-08-13 DIAGNOSIS — Z5112 Encounter for antineoplastic immunotherapy: Secondary | ICD-10-CM | POA: Diagnosis not present

## 2022-08-13 LAB — CBC WITH DIFFERENTIAL/PLATELET
Abs Immature Granulocytes: 0.01 10*3/uL (ref 0.00–0.07)
Basophils Absolute: 0 10*3/uL (ref 0.0–0.1)
Basophils Relative: 1 %
Eosinophils Absolute: 0.1 10*3/uL (ref 0.0–0.5)
Eosinophils Relative: 2 %
HCT: 36.7 % (ref 36.0–46.0)
Hemoglobin: 12.1 g/dL (ref 12.0–15.0)
Immature Granulocytes: 0 %
Lymphocytes Relative: 12 %
Lymphs Abs: 0.6 10*3/uL — ABNORMAL LOW (ref 0.7–4.0)
MCH: 32.3 pg (ref 26.0–34.0)
MCHC: 33 g/dL (ref 30.0–36.0)
MCV: 97.9 fL (ref 80.0–100.0)
Monocytes Absolute: 0.4 10*3/uL (ref 0.1–1.0)
Monocytes Relative: 7 %
Neutro Abs: 3.9 10*3/uL (ref 1.7–7.7)
Neutrophils Relative %: 78 %
Platelets: 113 10*3/uL — ABNORMAL LOW (ref 150–400)
RBC: 3.75 MIL/uL — ABNORMAL LOW (ref 3.87–5.11)
RDW: 16.7 % — ABNORMAL HIGH (ref 11.5–15.5)
WBC: 5.1 10*3/uL (ref 4.0–10.5)
nRBC: 0 % (ref 0.0–0.2)

## 2022-08-13 LAB — COMPREHENSIVE METABOLIC PANEL
ALT: 31 U/L (ref 0–44)
AST: 42 U/L — ABNORMAL HIGH (ref 15–41)
Albumin: 3.4 g/dL — ABNORMAL LOW (ref 3.5–5.0)
Alkaline Phosphatase: 82 U/L (ref 38–126)
Anion gap: 8 (ref 5–15)
BUN: 25 mg/dL — ABNORMAL HIGH (ref 8–23)
CO2: 23 mmol/L (ref 22–32)
Calcium: 8.8 mg/dL — ABNORMAL LOW (ref 8.9–10.3)
Chloride: 111 mmol/L (ref 98–111)
Creatinine, Ser: 1.3 mg/dL — ABNORMAL HIGH (ref 0.44–1.00)
GFR, Estimated: 42 mL/min — ABNORMAL LOW (ref >60)
Glucose, Bld: 140 mg/dL — ABNORMAL HIGH (ref 70–99)
Potassium: 3.8 mmol/L (ref 3.5–5.1)
Sodium: 142 mmol/L (ref 135–145)
Total Bilirubin: 0.7 mg/dL (ref 0.3–1.2)
Total Protein: 6.6 g/dL (ref 6.5–8.1)

## 2022-08-13 LAB — MAGNESIUM: Magnesium: 2.2 mg/dL (ref 1.7–2.4)

## 2022-08-13 MED ORDER — DEXAMETHASONE 4 MG PO TABS
10.0000 mg | ORAL_TABLET | Freq: Once | ORAL | Status: AC
  Administered 2022-08-13: 10 mg via ORAL
  Filled 2022-08-13: qty 3

## 2022-08-13 MED ORDER — BORTEZOMIB CHEMO SQ INJECTION 3.5 MG (2.5MG/ML)
1.3000 mg/m2 | Freq: Once | INTRAMUSCULAR | Status: AC
  Administered 2022-08-13: 2 mg via SUBCUTANEOUS
  Filled 2022-08-13: qty 0.8

## 2022-08-13 NOTE — Progress Notes (Signed)
Patient tolerated Velcade injection with no complaints voiced. Lab work reviewed. See MAR for details. Injection site clean and dry with no bruising or swelling noted. Patient stable during and after injection. Band aid applied. VSS. Patient left in satisfactory condition with no s/s of distress noted. 

## 2022-08-13 NOTE — Patient Instructions (Signed)
[facility]  Discharge Instructions: Thank you for choosing [facility] to provide your oncology and hematology care.  If you have a lab appointment with the Cancer Center, please come in thru the Main Entrance and check in at the main information desk.  Wear comfortable clothing and clothing appropriate for easy access to any Portacath or PICC line.   We strive to give you quality time with your provider. You may need to reschedule your appointment if you arrive late (15 or more minutes).  Arriving late affects you and other patients whose appointments are after yours.  Also, if you miss three or more appointments without notifying the office, you may be dismissed from the clinic at the provider's discretion.      For prescription refill requests, have your pharmacy contact our office and allow 72 hours for refills to be completed.    Today you received the following chemotherapy and/or immunotherapy agents Velcade, return as scheduled.   To help prevent nausea and vomiting after your treatment, we encourage you to take your nausea medication as directed.  BELOW ARE SYMPTOMS THAT SHOULD BE REPORTED IMMEDIATELY: *FEVER GREATER THAN 100.4 F (38 C) OR HIGHER *CHILLS OR SWEATING *NAUSEA AND VOMITING THAT IS NOT CONTROLLED WITH YOUR NAUSEA MEDICATION *UNUSUAL SHORTNESS OF BREATH *UNUSUAL BRUISING OR BLEEDING *URINARY PROBLEMS (pain or burning when urinating, or frequent urination) *BOWEL PROBLEMS (unusual diarrhea, constipation, pain near the anus) TENDERNESS IN MOUTH AND THROAT WITH OR WITHOUT PRESENCE OF ULCERS (sore throat, sores in mouth, or a toothache) UNUSUAL RASH, SWELLING OR PAIN  UNUSUAL VAGINAL DISCHARGE OR ITCHING   Items with * indicate a potential emergency and should be followed up as soon as possible or go to the Emergency Department if any problems should occur.  Please show the CHEMOTHERAPY ALERT CARD or IMMUNOTHERAPY ALERT CARD at  check-in to the Emergency Department and triage nurse.  Should you have questions after your visit or need to cancel or reschedule your appointment, please contact [facility] AT  [phone]  and follow the prompts.  Office hours are 8:00 a.m. to 4:30 p.m. Monday - Friday. Please note that voicemails left after 4:00 p.m. may not be returned until the following business day.  We are closed weekends and major holidays. You have access to a nurse at all times for urgent questions. Please call the main number to the clinic [phone] and follow the prompts.  For any non-urgent questions, you may also contact your provider using MyChart. We now offer e-Visits for anyone 18 and older to request care online for non-urgent symptoms. For details visit [email or web address].   Also download the MyChart app! Go to the app store, search "MyChart", open the app, select [facility], and log in with your MyChart username and password.  Masks are optional in the cancer centers. If you would like for your care team to wear a mask while they are taking care of you, please let them know. You may have one support person who is at least 78 years old accompany you for your appointments.  

## 2022-08-14 MED ORDER — LIDOCAINE HCL (PF) 1 % IJ SOLN
INTRAMUSCULAR | Status: AC
  Filled 2022-08-14: qty 5

## 2022-08-19 ENCOUNTER — Other Ambulatory Visit: Payer: Self-pay

## 2022-08-19 ENCOUNTER — Other Ambulatory Visit: Payer: Self-pay | Admitting: Hematology

## 2022-08-19 DIAGNOSIS — C9000 Multiple myeloma not having achieved remission: Secondary | ICD-10-CM

## 2022-08-20 ENCOUNTER — Inpatient Hospital Stay: Payer: Medicare PPO | Attending: Hematology

## 2022-08-20 ENCOUNTER — Inpatient Hospital Stay (HOSPITAL_COMMUNITY): Payer: Medicare PPO | Attending: Hematology

## 2022-08-20 VITALS — BP 140/75 | HR 81 | Temp 96.6°F | Resp 18 | Wt 121.5 lb

## 2022-08-20 DIAGNOSIS — M79672 Pain in left foot: Secondary | ICD-10-CM | POA: Diagnosis not present

## 2022-08-20 DIAGNOSIS — C9000 Multiple myeloma not having achieved remission: Secondary | ICD-10-CM | POA: Diagnosis not present

## 2022-08-20 DIAGNOSIS — M25572 Pain in left ankle and joints of left foot: Secondary | ICD-10-CM | POA: Diagnosis not present

## 2022-08-20 DIAGNOSIS — M79662 Pain in left lower leg: Secondary | ICD-10-CM | POA: Diagnosis not present

## 2022-08-20 LAB — COMPREHENSIVE METABOLIC PANEL
ALT: 24 U/L (ref 0–44)
AST: 30 U/L (ref 15–41)
Albumin: 3.3 g/dL — ABNORMAL LOW (ref 3.5–5.0)
Alkaline Phosphatase: 77 U/L (ref 38–126)
Anion gap: 7 (ref 5–15)
BUN: 24 mg/dL — ABNORMAL HIGH (ref 8–23)
CO2: 23 mmol/L (ref 22–32)
Calcium: 8.8 mg/dL — ABNORMAL LOW (ref 8.9–10.3)
Chloride: 113 mmol/L — ABNORMAL HIGH (ref 98–111)
Creatinine, Ser: 1.26 mg/dL — ABNORMAL HIGH (ref 0.44–1.00)
GFR, Estimated: 44 mL/min — ABNORMAL LOW (ref >60)
Glucose, Bld: 144 mg/dL — ABNORMAL HIGH (ref 70–99)
Potassium: 4 mmol/L (ref 3.5–5.1)
Sodium: 143 mmol/L (ref 135–145)
Total Bilirubin: 0.6 mg/dL (ref 0.3–1.2)
Total Protein: 6.4 g/dL — ABNORMAL LOW (ref 6.5–8.1)

## 2022-08-20 LAB — CBC WITH DIFFERENTIAL/PLATELET
Abs Immature Granulocytes: 0 10*3/uL (ref 0.00–0.07)
Basophils Absolute: 0 10*3/uL (ref 0.0–0.1)
Basophils Relative: 1 %
Eosinophils Absolute: 0.1 10*3/uL (ref 0.0–0.5)
Eosinophils Relative: 3 %
HCT: 36 % (ref 36.0–46.0)
Hemoglobin: 11.7 g/dL — ABNORMAL LOW (ref 12.0–15.0)
Immature Granulocytes: 0 %
Lymphocytes Relative: 10 %
Lymphs Abs: 0.5 10*3/uL — ABNORMAL LOW (ref 0.7–4.0)
MCH: 32.5 pg (ref 26.0–34.0)
MCHC: 32.5 g/dL (ref 30.0–36.0)
MCV: 100 fL (ref 80.0–100.0)
Monocytes Absolute: 0.4 10*3/uL (ref 0.1–1.0)
Monocytes Relative: 8 %
Neutro Abs: 4.1 10*3/uL (ref 1.7–7.7)
Neutrophils Relative %: 78 %
Platelets: 86 10*3/uL — ABNORMAL LOW (ref 150–400)
RBC: 3.6 MIL/uL — ABNORMAL LOW (ref 3.87–5.11)
RDW: 17.2 % — ABNORMAL HIGH (ref 11.5–15.5)
WBC: 5.2 10*3/uL (ref 4.0–10.5)
nRBC: 0 % (ref 0.0–0.2)

## 2022-08-20 LAB — MAGNESIUM: Magnesium: 2.4 mg/dL (ref 1.7–2.4)

## 2022-08-20 MED ORDER — DEXAMETHASONE 4 MG PO TABS
10.0000 mg | ORAL_TABLET | Freq: Once | ORAL | Status: AC
  Administered 2022-08-20: 10 mg via ORAL
  Filled 2022-08-20: qty 3

## 2022-08-20 MED ORDER — BORTEZOMIB CHEMO SQ INJECTION 3.5 MG (2.5MG/ML)
1.3000 mg/m2 | Freq: Once | INTRAMUSCULAR | Status: AC
  Administered 2022-08-20: 2 mg via SUBCUTANEOUS
  Filled 2022-08-20: qty 0.8

## 2022-08-20 NOTE — Progress Notes (Signed)
Pt presents today for Velcade per provider's order. Vital signs and labs WNL for treatment today. Okay to proceed with treatment today.    Velcade given today per MD orders. Tolerated infusion without adverse affects. Vital signs stable. No complaints at this time. Discharged from clinic via wheelchair in stable condition. Alert and oriented x 3. F/U with [facility] as scheduled.

## 2022-08-20 NOTE — Patient Instructions (Signed)
[facility]  Discharge Instructions: Thank you for choosing [facility] to provide your oncology and hematology care.  If you have a lab appointment with the Cancer Center, please come in thru the Main Entrance and check in at the main information desk.  Wear comfortable clothing and clothing appropriate for easy access to any Portacath or PICC line.   We strive to give you quality time with your provider. You may need to reschedule your appointment if you arrive late (15 or more minutes).  Arriving late affects you and other patients whose appointments are after yours.  Also, if you miss three or more appointments without notifying the office, you may be dismissed from the clinic at the provider's discretion.      For prescription refill requests, have your pharmacy contact our office and allow 72 hours for refills to be completed.    Today you received the following chemotherapy and/or immunotherapy agents Velcade.   To help prevent nausea and vomiting after your treatment, we encourage you to take your nausea medication as directed.  BELOW ARE SYMPTOMS THAT SHOULD BE REPORTED IMMEDIATELY: *FEVER GREATER THAN 100.4 F (38 C) OR HIGHER *CHILLS OR SWEATING *NAUSEA AND VOMITING THAT IS NOT CONTROLLED WITH YOUR NAUSEA MEDICATION *UNUSUAL SHORTNESS OF BREATH *UNUSUAL BRUISING OR BLEEDING *URINARY PROBLEMS (pain or burning when urinating, or frequent urination) *BOWEL PROBLEMS (unusual diarrhea, constipation, pain near the anus) TENDERNESS IN MOUTH AND THROAT WITH OR WITHOUT PRESENCE OF ULCERS (sore throat, sores in mouth, or a toothache) UNUSUAL RASH, SWELLING OR PAIN  UNUSUAL VAGINAL DISCHARGE OR ITCHING   Items with * indicate a potential emergency and should be followed up as soon as possible or go to the Emergency Department if any problems should occur.  Please show the CHEMOTHERAPY ALERT CARD or IMMUNOTHERAPY ALERT CARD at check-in to the Emergency  Department and triage nurse.  Should you have questions after your visit or need to cancel or reschedule your appointment, please contact [facility] AT  [phone]  and follow the prompts.  Office hours are 8:00 a.m. to 4:30 p.m. Monday - Friday. Please note that voicemails left after 4:00 p.m. may not be returned until the following business day.  We are closed weekends and major holidays. You have access to a nurse at all times for urgent questions. Please call the main number to the clinic [phone] and follow the prompts.  For any non-urgent questions, you may also contact your provider using MyChart. We now offer e-Visits for anyone 18 and older to request care online for non-urgent symptoms. For details visit [email or web address].   Also download the MyChart app! Go to the app store, search "MyChart", open the app, select [facility], and log in with your MyChart username and password.  Masks are optional in the cancer centers. If you would like for your care team to wear a mask while they are taking care of you, please let them know. You may have one support person who is at least 78 years old accompany you for your appointments.  Bortezomib Injection What is this medication? BORTEZOMIB (bor TEZ oh mib) treats lymphoma. It may also be used to treat multiple myeloma, a type of bone marrow cancer. It works by blocking a protein that causes cancer cells to grow and multiply. This helps to slow or stop the spread of cancer cells. This medicine may be used for other purposes; ask your health care provider or pharmacist if you have   questions. COMMON BRAND NAME(S): Velcade What should I tell my care team before I take this medication? They need to know if you have any of these conditions: Dehydration Diabetes Heart disease Liver disease Tingling of the fingers or toes or other nerve disorder An unusual or allergic reaction to bortezomib, other medications, foods, dyes,  or preservatives If you or your partner are pregnant or trying to get pregnant Breastfeeding How should I use this medication? This medication is injected into a vein or under the skin. It is given by your care team in a hospital or clinic setting. Talk to your care team about the use of this medication in children. Special care may be needed. Overdosage: If you think you have taken too much of this medicine contact a poison control center or emergency room at once. NOTE: This medicine is only for you. Do not share this medicine with others. What if I miss a dose? Keep appointments for follow-up doses. It is important not to miss your dose. Call your care team if you are unable to keep an appointment. What may interact with this medication? Ketoconazole Rifampin This list may not describe all possible interactions. Give your health care provider a list of all the medicines, herbs, non-prescription drugs, or dietary supplements you use. Also tell them if you smoke, drink alcohol, or use illegal drugs. Some items may interact with your medicine. What should I watch for while using this medication? Your condition will be monitored carefully while you are receiving this medication. You may need blood work while taking this medication. This medication may affect your coordination, reaction time, or judgment. Do not drive or operate machinery until you know how this medication affects you. Sit up or stand slowly to reduce the risk of dizzy or fainting spells. Drinking alcohol with this medication can increase the risk of these side effects. This medication may increase your risk of getting an infection. Call your care team for advice if you get a fever, chills, sore throat, or other symptoms of a cold or flu. Do not treat yourself. Try to avoid being around people who are sick. Check with your care team if you have severe diarrhea, nausea, and vomiting, or if you sweat a lot. The loss of too much body  fluid may make it dangerous for you to take this medication. Talk to your care team if you may be pregnant. Serious birth defects can occur if you take this medication during pregnancy and for 7 months after the last dose. You will need a negative pregnancy test before starting this medication. Contraception is recommended while taking this medication and for 7 months after the last dose. Your care team can help you find the option that works for you. If your partner can get pregnant, use a condom during sex while taking this medication and for 4 months after the last dose. Do not breastfeed while taking this medication and for 2 months after the last dose. This medication may cause infertility. Talk to your care team if you are concerned about your fertility. What side effects may I notice from receiving this medication? Side effects that you should report to your care team as soon as possible: Allergic reactions--skin rash, itching, hives, swelling of the face, lips, tongue, or throat Bleeding--bloody or black, tar-like stools, vomiting blood or brown material that looks like coffee grounds, red or dark brown urine, small red or purple spots on skin, unusual bruising or bleeding Bleeding in the brain--severe headache,   stiff neck, confusion, dizziness, change in vision, numbness or weakness of the face, arm, or leg, trouble speaking, trouble walking, vomiting Bowel blockage--stomach cramping, unable to have a bowel movement or pass gas, loss of appetite, vomiting Heart failure--shortness of breath, swelling of the ankles, feet, or hands, sudden weight gain, unusual weakness or fatigue Infection--fever, chills, cough, sore throat, wounds that don't heal, pain or trouble when passing urine, general feeling of discomfort or being unwell Liver injury--right upper belly pain, loss of appetite, nausea, light-colored stool, dark yellow or brown urine, yellowing skin or eyes, unusual weakness or fatigue Low  blood pressure--dizziness, feeling faint or lightheaded, blurry vision Lung injury--shortness of breath or trouble breathing, cough, spitting up blood, chest pain, fever Pain, tingling, or numbness in the hands or feet Severe or prolonged diarrhea Stomach pain, bloody diarrhea, pale skin, unusual weakness or fatigue, decrease in the amount of urine, which may be signs of hemolytic uremic syndrome Sudden and severe headache, confusion, change in vision, seizures, which may be signs of posterior reversible encephalopathy syndrome (PRES) TTP--purple spots on the skin or inside the mouth, pale skin, yellowing skin or eyes, unusual weakness or fatigue, fever, fast or irregular heartbeat, confusion, change in vision, trouble speaking, trouble walking Tumor lysis syndrome (TLS)--nausea, vomiting, diarrhea, decrease in the amount of urine, dark urine, unusual weakness or fatigue, confusion, muscle pain or cramps, fast or irregular heartbeat, joint pain Side effects that usually do not require medical attention (report to your care team if they continue or are bothersome): Constipation Diarrhea Fatigue Loss of appetite Nausea This list may not describe all possible side effects. Call your doctor for medical advice about side effects. You may report side effects to FDA at 1-800-FDA-1088. Where should I keep my medication? This medication is given in a hospital or clinic. It will not be stored at home. NOTE: This sheet is a summary. It may not cover all possible information. If you have questions about this medicine, talk to your doctor, pharmacist, or health care provider.  2023 Elsevier/Gold Standard (2022-04-30 00:00:00)  

## 2022-08-21 LAB — KAPPA/LAMBDA LIGHT CHAINS
Kappa free light chain: 24.1 mg/L — ABNORMAL HIGH (ref 3.3–19.4)
Kappa, lambda light chain ratio: 0.9 (ref 0.26–1.65)
Lambda free light chains: 26.9 mg/L — ABNORMAL HIGH (ref 5.7–26.3)

## 2022-08-23 ENCOUNTER — Other Ambulatory Visit: Payer: Self-pay

## 2022-08-25 LAB — PROTEIN ELECTROPHORESIS, SERUM
A/G Ratio: 1.2 (ref 0.7–1.7)
Albumin ELP: 3.3 g/dL (ref 2.9–4.4)
Alpha-1-Globulin: 0.2 g/dL (ref 0.0–0.4)
Alpha-2-Globulin: 0.7 g/dL (ref 0.4–1.0)
Beta Globulin: 0.7 g/dL (ref 0.7–1.3)
Gamma Globulin: 1.2 g/dL (ref 0.4–1.8)
Globulin, Total: 2.8 g/dL (ref 2.2–3.9)
M-Spike, %: 0.5 g/dL — ABNORMAL HIGH
Total Protein ELP: 6.1 g/dL (ref 6.0–8.5)

## 2022-08-27 DIAGNOSIS — R2242 Localized swelling, mass and lump, left lower limb: Secondary | ICD-10-CM | POA: Diagnosis not present

## 2022-08-27 DIAGNOSIS — M79662 Pain in left lower leg: Secondary | ICD-10-CM | POA: Diagnosis not present

## 2022-08-28 ENCOUNTER — Other Ambulatory Visit: Payer: Self-pay

## 2022-08-28 DIAGNOSIS — R059 Cough, unspecified: Secondary | ICD-10-CM | POA: Diagnosis not present

## 2022-09-03 ENCOUNTER — Inpatient Hospital Stay: Payer: Medicare PPO

## 2022-09-03 ENCOUNTER — Inpatient Hospital Stay: Payer: Medicare PPO | Attending: Hematology

## 2022-09-03 ENCOUNTER — Other Ambulatory Visit: Payer: Self-pay | Admitting: *Deleted

## 2022-09-03 VITALS — BP 125/70 | HR 67 | Temp 96.6°F | Resp 18 | Wt 112.1 lb

## 2022-09-03 DIAGNOSIS — Z5112 Encounter for antineoplastic immunotherapy: Secondary | ICD-10-CM | POA: Diagnosis not present

## 2022-09-03 DIAGNOSIS — C9000 Multiple myeloma not having achieved remission: Secondary | ICD-10-CM

## 2022-09-03 LAB — CBC WITH DIFFERENTIAL/PLATELET
Abs Immature Granulocytes: 0.01 10*3/uL (ref 0.00–0.07)
Basophils Absolute: 0 10*3/uL (ref 0.0–0.1)
Basophils Relative: 0 %
Eosinophils Absolute: 0.2 10*3/uL (ref 0.0–0.5)
Eosinophils Relative: 4 %
HCT: 32.3 % — ABNORMAL LOW (ref 36.0–46.0)
Hemoglobin: 10.5 g/dL — ABNORMAL LOW (ref 12.0–15.0)
Immature Granulocytes: 0 %
Lymphocytes Relative: 10 %
Lymphs Abs: 0.4 10*3/uL — ABNORMAL LOW (ref 0.7–4.0)
MCH: 32.4 pg (ref 26.0–34.0)
MCHC: 32.5 g/dL (ref 30.0–36.0)
MCV: 99.7 fL (ref 80.0–100.0)
Monocytes Absolute: 0.3 10*3/uL (ref 0.1–1.0)
Monocytes Relative: 7 %
Neutro Abs: 3.6 10*3/uL (ref 1.7–7.7)
Neutrophils Relative %: 79 %
Platelets: 137 10*3/uL — ABNORMAL LOW (ref 150–400)
RBC: 3.24 MIL/uL — ABNORMAL LOW (ref 3.87–5.11)
RDW: 17 % — ABNORMAL HIGH (ref 11.5–15.5)
WBC: 4.5 10*3/uL (ref 4.0–10.5)
nRBC: 0 % (ref 0.0–0.2)

## 2022-09-03 LAB — COMPREHENSIVE METABOLIC PANEL
ALT: 28 U/L (ref 0–44)
AST: 30 U/L (ref 15–41)
Albumin: 3.1 g/dL — ABNORMAL LOW (ref 3.5–5.0)
Alkaline Phosphatase: 83 U/L (ref 38–126)
Anion gap: 6 (ref 5–15)
BUN: 30 mg/dL — ABNORMAL HIGH (ref 8–23)
CO2: 22 mmol/L (ref 22–32)
Calcium: 8.5 mg/dL — ABNORMAL LOW (ref 8.9–10.3)
Chloride: 113 mmol/L — ABNORMAL HIGH (ref 98–111)
Creatinine, Ser: 1.34 mg/dL — ABNORMAL HIGH (ref 0.44–1.00)
GFR, Estimated: 41 mL/min — ABNORMAL LOW (ref >60)
Glucose, Bld: 159 mg/dL — ABNORMAL HIGH (ref 70–99)
Potassium: 3.7 mmol/L (ref 3.5–5.1)
Sodium: 141 mmol/L (ref 135–145)
Total Bilirubin: 0.5 mg/dL (ref 0.3–1.2)
Total Protein: 6.4 g/dL — ABNORMAL LOW (ref 6.5–8.1)

## 2022-09-03 LAB — MAGNESIUM: Magnesium: 2.1 mg/dL (ref 1.7–2.4)

## 2022-09-03 MED ORDER — BORTEZOMIB CHEMO SQ INJECTION 3.5 MG (2.5MG/ML)
1.3000 mg/m2 | Freq: Once | INTRAMUSCULAR | Status: AC
  Administered 2022-09-03: 2 mg via SUBCUTANEOUS
  Filled 2022-09-03: qty 0.8

## 2022-09-03 MED ORDER — DEXAMETHASONE 4 MG PO TABS
10.0000 mg | ORAL_TABLET | Freq: Once | ORAL | Status: AC
  Administered 2022-09-03: 10 mg via ORAL
  Filled 2022-09-03: qty 3

## 2022-09-03 NOTE — Patient Instructions (Signed)
[facility]  Discharge Instructions: Thank you for choosing [facility] to provide your oncology and hematology care.  If you have a lab appointment with the Cancer Center, please come in thru the Main Entrance and check in at the main information desk.  Wear comfortable clothing and clothing appropriate for easy access to any Portacath or PICC line.   We strive to give you quality time with your provider. You may need to reschedule your appointment if you arrive late (15 or more minutes).  Arriving late affects you and other patients whose appointments are after yours.  Also, if you miss three or more appointments without notifying the office, you may be dismissed from the clinic at the provider's discretion.      For prescription refill requests, have your pharmacy contact our office and allow 72 hours for refills to be completed.    Today you received the following chemotherapy and/or immunotherapy agents Velcade.  Bortezomib Injection What is this medication? BORTEZOMIB (bor TEZ oh mib) treats lymphoma. It may also be used to treat multiple myeloma, a type of bone marrow cancer. It works by blocking a protein that causes cancer cells to grow and multiply. This helps to slow or stop the spread of cancer cells. This medicine may be used for other purposes; ask your health care provider or pharmacist if you have questions. COMMON BRAND NAME(S): Velcade What should I tell my care team before I take this medication? They need to know if you have any of these conditions: Dehydration Diabetes Heart disease Liver disease Tingling of the fingers or toes or other nerve disorder An unusual or allergic reaction to bortezomib, other medications, foods, dyes, or preservatives If you or your partner are pregnant or trying to get pregnant Breastfeeding How should I use this medication? This medication is injected into a vein or under the skin. It is given by your  care team in a hospital or clinic setting. Talk to your care team about the use of this medication in children. Special care may be needed. Overdosage: If you think you have taken too much of this medicine contact a poison control center or emergency room at once. NOTE: This medicine is only for you. Do not share this medicine with others. What if I miss a dose? Keep appointments for follow-up doses. It is important not to miss your dose. Call your care team if you are unable to keep an appointment. What may interact with this medication? Ketoconazole Rifampin This list may not describe all possible interactions. Give your health care provider a list of all the medicines, herbs, non-prescription drugs, or dietary supplements you use. Also tell them if you smoke, drink alcohol, or use illegal drugs. Some items may interact with your medicine. What should I watch for while using this medication? Your condition will be monitored carefully while you are receiving this medication. You may need blood work while taking this medication. This medication may affect your coordination, reaction time, or judgment. Do not drive or operate machinery until you know how this medication affects you. Sit up or stand slowly to reduce the risk of dizzy or fainting spells. Drinking alcohol with this medication can increase the risk of these side effects. This medication may increase your risk of getting an infection. Call your care team for advice if you get a fever, chills, sore throat, or other symptoms of a cold or flu. Do not treat yourself. Try to avoid being around   people who are sick. Check with your care team if you have severe diarrhea, nausea, and vomiting, or if you sweat a lot. The loss of too much body fluid may make it dangerous for you to take this medication. Talk to your care team if you may be pregnant. Serious birth defects can occur if you take this medication during pregnancy and for 7 months after the  last dose. You will need a negative pregnancy test before starting this medication. Contraception is recommended while taking this medication and for 7 months after the last dose. Your care team can help you find the option that works for you. If your partner can get pregnant, use a condom during sex while taking this medication and for 4 months after the last dose. Do not breastfeed while taking this medication and for 2 months after the last dose. This medication may cause infertility. Talk to your care team if you are concerned about your fertility. What side effects may I notice from receiving this medication? Side effects that you should report to your care team as soon as possible: Allergic reactions--skin rash, itching, hives, swelling of the face, lips, tongue, or throat Bleeding--bloody or black, tar-like stools, vomiting blood or [place] material that looks like coffee grounds, red or dark [place] Short urine, small red or purple spots on skin, unusual bruising or bleeding Bleeding in the brain--severe headache, stiff neck, confusion, dizziness, change in vision, numbness or weakness of the face, arm, or leg, trouble speaking, trouble walking, vomiting Bowel blockage--stomach cramping, unable to have a bowel movement or pass gas, loss of appetite, vomiting Heart failure--shortness of breath, swelling of the ankles, feet, or hands, sudden weight gain, unusual weakness or fatigue Infection--fever, chills, cough, sore throat, wounds that don't heal, pain or trouble when passing urine, general feeling of discomfort or being unwell Liver injury--right upper belly pain, loss of appetite, nausea, light-colored stool, dark yellow or [place] urine, yellowing skin or eyes, unusual weakness or fatigue Low blood pressure--dizziness, feeling faint or lightheaded, blurry vision Lung injury--shortness of breath or trouble breathing, cough, spitting up blood, chest pain, fever Pain, tingling, or numbness in the hands  or feet Severe or prolonged diarrhea Stomach pain, bloody diarrhea, pale skin, unusual weakness or fatigue, decrease in the amount of urine, which may be signs of hemolytic uremic syndrome Sudden and severe headache, confusion, change in vision, seizures, which may be signs of posterior reversible encephalopathy syndrome (PRES) TTP--purple spots on the skin or inside the mouth, pale skin, yellowing skin or eyes, unusual weakness or fatigue, fever, fast or irregular heartbeat, confusion, change in vision, trouble speaking, trouble walking Tumor lysis syndrome (TLS)--nausea, vomiting, diarrhea, decrease in the amount of urine, dark urine, unusual weakness or fatigue, confusion, muscle pain or cramps, fast or irregular heartbeat, joint pain Side effects that usually do not require medical attention (report to your care team if they continue or are bothersome): Constipation Diarrhea Fatigue Loss of appetite Nausea This list may not describe all possible side effects. Call your doctor for medical advice about side effects. You may report side effects to FDA at 1-800-FDA-1088. Where should I keep my medication? This medication is given in a hospital or clinic. It will not be stored at home. NOTE: This sheet is a summary. It may not cover all possible information. If you have questions about this medicine, talk to your doctor, pharmacist, or health care provider.  2023 Elsevier/Gold Standard (2022-04-30 00:00:00)        To help  prevent nausea and vomiting after your treatment, we encourage you to take your nausea medication as directed.  BELOW ARE SYMPTOMS THAT SHOULD BE REPORTED IMMEDIATELY: *FEVER GREATER THAN 100.4 F (38 C) OR HIGHER *CHILLS OR SWEATING *NAUSEA AND VOMITING THAT IS NOT CONTROLLED WITH YOUR NAUSEA MEDICATION *UNUSUAL SHORTNESS OF BREATH *UNUSUAL BRUISING OR BLEEDING *URINARY PROBLEMS (pain or burning when urinating, or frequent urination) *BOWEL PROBLEMS (unusual diarrhea,  constipation, pain near the anus) TENDERNESS IN MOUTH AND THROAT WITH OR WITHOUT PRESENCE OF ULCERS (sore throat, sores in mouth, or a toothache) UNUSUAL RASH, SWELLING OR PAIN  UNUSUAL VAGINAL DISCHARGE OR ITCHING   Items with * indicate a potential emergency and should be followed up as soon as possible or go to the Emergency Department if any problems should occur.  Please show the CHEMOTHERAPY ALERT CARD or IMMUNOTHERAPY ALERT CARD at check-in to the Emergency Department and triage nurse.  Should you have questions after your visit or need to cancel or reschedule your appointment, please contact [facility] [phone]  and follow the prompts.  Office hours are 8:00 a.m. to 4:30 p.m. Monday - Friday. Please note that voicemails left after 4:00 p.m. may not be returned until the following business day.  We are closed weekends and major holidays. You have access to a nurse at all times for urgent questions. Please call the main number to the clinic [phone] and follow the prompts.  For any non-urgent questions, you may also contact your provider using MyChart. We now offer e-Visits for anyone 26 and older to request care online for non-urgent symptoms. For details visit [email or web address].   Also download the MyChart app! Go to the app store, search "MyChart", open the app, select [facility], and log in with your MyChart username and password.  Masks are optional in the cancer centers. If you would like for your care team to wear a mask while they are taking care of you, please let them know. You may have one support person who is at least 78 years old accompany you for your appointments.

## 2022-09-03 NOTE — Progress Notes (Signed)
Patient presents today for Velcade injection.  Patient is in satisfactory condition with no complaints voiced.  Vital signs are stable.  Labs reviewed and all labs are within treatment parameters.  We will proceed with injection per provider orders.   Patient tolerated Velcade injection with no complaints voiced.  Site clean and dry with no bruising or swelling noted.  No complaints of pain.  Discharged via wheelchair with NT with vital signs stable and no signs or symptoms of distress noted.

## 2022-09-04 ENCOUNTER — Other Ambulatory Visit: Payer: Self-pay

## 2022-09-04 LAB — KAPPA/LAMBDA LIGHT CHAINS
Kappa free light chain: 31.8 mg/L — ABNORMAL HIGH (ref 3.3–19.4)
Kappa, lambda light chain ratio: 0.79 (ref 0.26–1.65)
Lambda free light chains: 40.4 mg/L — ABNORMAL HIGH (ref 5.7–26.3)

## 2022-09-08 ENCOUNTER — Other Ambulatory Visit: Payer: Self-pay

## 2022-09-08 DIAGNOSIS — C9000 Multiple myeloma not having achieved remission: Secondary | ICD-10-CM

## 2022-09-08 LAB — PROTEIN ELECTROPHORESIS, SERUM
A/G Ratio: 1.1 (ref 0.7–1.7)
Albumin ELP: 3 g/dL (ref 2.9–4.4)
Alpha-1-Globulin: 0.3 g/dL (ref 0.0–0.4)
Alpha-2-Globulin: 0.8 g/dL (ref 0.4–1.0)
Beta Globulin: 0.6 g/dL — ABNORMAL LOW (ref 0.7–1.3)
Gamma Globulin: 1.1 g/dL (ref 0.4–1.8)
Globulin, Total: 2.7 g/dL (ref 2.2–3.9)
M-Spike, %: 0.5 g/dL — ABNORMAL HIGH
Total Protein ELP: 5.7 g/dL — ABNORMAL LOW (ref 6.0–8.5)

## 2022-09-09 ENCOUNTER — Inpatient Hospital Stay: Payer: Medicare PPO

## 2022-09-09 DIAGNOSIS — Z5112 Encounter for antineoplastic immunotherapy: Secondary | ICD-10-CM | POA: Insufficient documentation

## 2022-09-09 DIAGNOSIS — C9000 Multiple myeloma not having achieved remission: Secondary | ICD-10-CM | POA: Diagnosis not present

## 2022-09-09 LAB — CBC WITH DIFFERENTIAL/PLATELET
Abs Immature Granulocytes: 0.02 10*3/uL (ref 0.00–0.07)
Basophils Absolute: 0 10*3/uL (ref 0.0–0.1)
Basophils Relative: 1 %
Eosinophils Absolute: 0.2 10*3/uL (ref 0.0–0.5)
Eosinophils Relative: 4 %
HCT: 34.5 % — ABNORMAL LOW (ref 36.0–46.0)
Hemoglobin: 11.3 g/dL — ABNORMAL LOW (ref 12.0–15.0)
Immature Granulocytes: 0 %
Lymphocytes Relative: 13 %
Lymphs Abs: 0.7 10*3/uL (ref 0.7–4.0)
MCH: 32.6 pg (ref 26.0–34.0)
MCHC: 32.8 g/dL (ref 30.0–36.0)
MCV: 99.4 fL (ref 80.0–100.0)
Monocytes Absolute: 0.5 10*3/uL (ref 0.1–1.0)
Monocytes Relative: 9 %
Neutro Abs: 3.7 10*3/uL (ref 1.7–7.7)
Neutrophils Relative %: 73 %
Platelets: 132 10*3/uL — ABNORMAL LOW (ref 150–400)
RBC: 3.47 MIL/uL — ABNORMAL LOW (ref 3.87–5.11)
RDW: 17.2 % — ABNORMAL HIGH (ref 11.5–15.5)
WBC: 5.1 10*3/uL (ref 4.0–10.5)
nRBC: 0 % (ref 0.0–0.2)

## 2022-09-09 LAB — COMPREHENSIVE METABOLIC PANEL
ALT: 29 U/L (ref 0–44)
AST: 35 U/L (ref 15–41)
Albumin: 3.2 g/dL — ABNORMAL LOW (ref 3.5–5.0)
Alkaline Phosphatase: 85 U/L (ref 38–126)
Anion gap: 7 (ref 5–15)
BUN: 22 mg/dL (ref 8–23)
CO2: 24 mmol/L (ref 22–32)
Calcium: 8.8 mg/dL — ABNORMAL LOW (ref 8.9–10.3)
Chloride: 112 mmol/L — ABNORMAL HIGH (ref 98–111)
Creatinine, Ser: 1.29 mg/dL — ABNORMAL HIGH (ref 0.44–1.00)
GFR, Estimated: 42 mL/min — ABNORMAL LOW (ref >60)
Glucose, Bld: 119 mg/dL — ABNORMAL HIGH (ref 70–99)
Potassium: 4.1 mmol/L (ref 3.5–5.1)
Sodium: 143 mmol/L (ref 135–145)
Total Bilirubin: 0.5 mg/dL (ref 0.3–1.2)
Total Protein: 6.6 g/dL (ref 6.5–8.1)

## 2022-09-09 LAB — MAGNESIUM: Magnesium: 2.2 mg/dL (ref 1.7–2.4)

## 2022-09-10 ENCOUNTER — Encounter: Payer: Self-pay | Admitting: Hematology

## 2022-09-10 ENCOUNTER — Inpatient Hospital Stay (HOSPITAL_COMMUNITY): Payer: Medicare PPO

## 2022-09-10 ENCOUNTER — Inpatient Hospital Stay: Payer: Medicare PPO

## 2022-09-10 ENCOUNTER — Inpatient Hospital Stay (HOSPITAL_BASED_OUTPATIENT_CLINIC_OR_DEPARTMENT_OTHER): Payer: Medicare PPO | Admitting: Hematology

## 2022-09-10 DIAGNOSIS — C9000 Multiple myeloma not having achieved remission: Secondary | ICD-10-CM

## 2022-09-10 DIAGNOSIS — Z5112 Encounter for antineoplastic immunotherapy: Secondary | ICD-10-CM | POA: Diagnosis not present

## 2022-09-10 MED ORDER — BORTEZOMIB CHEMO SQ INJECTION 3.5 MG (2.5MG/ML)
1.3000 mg/m2 | Freq: Once | INTRAMUSCULAR | Status: AC
  Administered 2022-09-10: 2 mg via SUBCUTANEOUS
  Filled 2022-09-10: qty 0.8

## 2022-09-10 MED ORDER — DEXAMETHASONE 4 MG PO TABS: 10.0000 mg | ORAL_TABLET | Freq: Once | ORAL | Status: DC

## 2022-09-10 MED ORDER — BORTEZOMIB CHEMO SQ INJECTION 3.5 MG (2.5MG/ML): 1.3000 mg/m2 | Freq: Once | INTRAMUSCULAR | Status: DC

## 2022-09-10 MED ORDER — DEXAMETHASONE 4 MG PO TABS
10.0000 mg | ORAL_TABLET | Freq: Once | ORAL | Status: AC
  Administered 2022-09-10: 10 mg via ORAL
  Filled 2022-09-10: qty 3

## 2022-09-10 NOTE — Progress Notes (Signed)
Patient presents today for Day 8 of velcade. Patient tolerated Velcade injection with no complaints voiced. Lab work reviewed. See MAR for details. Injection site clean and dry with no bruising or swelling noted. Patient stable during and after injection. Band aid applied. VSS. Patient left in satisfactory condition with no s/s of distress noted.

## 2022-09-10 NOTE — Patient Instructions (Signed)
[facility]  Discharge Instructions: Thank you for choosing [facility] to provide your oncology and hematology care.  If you have a lab appointment with the Cancer Center, please come in thru the Main Entrance and check in at the main information desk.  Wear comfortable clothing and clothing appropriate for easy access to any Portacath or PICC line.   We strive to give you quality time with your provider. You may need to reschedule your appointment if you arrive late (15 or more minutes).  Arriving late affects you and other patients whose appointments are after yours.  Also, if you miss three or more appointments without notifying the office, you may be dismissed from the clinic at the provider's discretion.      For prescription refill requests, have your pharmacy contact our office and allow 72 hours for refills to be completed.    Today you received the following chemotherapy and/or immunotherapy agents Velcade, return as scheduled.   To help prevent nausea and vomiting after your treatment, we encourage you to take your nausea medication as directed.  BELOW ARE SYMPTOMS THAT SHOULD BE REPORTED IMMEDIATELY: *FEVER GREATER THAN 100.4 F (38 C) OR HIGHER *CHILLS OR SWEATING *NAUSEA AND VOMITING THAT IS NOT CONTROLLED WITH YOUR NAUSEA MEDICATION *UNUSUAL SHORTNESS OF BREATH *UNUSUAL BRUISING OR BLEEDING *URINARY PROBLEMS (pain or burning when urinating, or frequent urination) *BOWEL PROBLEMS (unusual diarrhea, constipation, pain near the anus) TENDERNESS IN MOUTH AND THROAT WITH OR WITHOUT PRESENCE OF ULCERS (sore throat, sores in mouth, or a toothache) UNUSUAL RASH, SWELLING OR PAIN  UNUSUAL VAGINAL DISCHARGE OR ITCHING   Items with * indicate a potential emergency and should be followed up as soon as possible or go to the Emergency Department if any problems should occur.  Please show the CHEMOTHERAPY ALERT CARD or IMMUNOTHERAPY ALERT CARD at  check-in to the Emergency Department and triage nurse.  Should you have questions after your visit or need to cancel or reschedule your appointment, please contact [facility] [phone]  and follow the prompts.  Office hours are 8:00 a.m. to 4:30 p.m. Monday - Friday. Please note that voicemails left after 4:00 p.m. may not be returned until the following business day.  We are closed weekends and major holidays. You have access to a nurse at all times for urgent questions. Please call the main number to the clinic [phone] and follow the prompts.  For any non-urgent questions, you may also contact your provider using MyChart. We now offer e-Visits for anyone 18 and older to request care online for non-urgent symptoms. For details visit [email or web address].   Also download the MyChart app! Go to the app store, search "MyChart", open the app, select [facility], and log in with your MyChart username and password.  Masks are optional in the cancer centers. If you would like for your care team to wear a mask while they are taking care of you, please let them know. You may have one support person who is at least 78 years old accompany you for your appointments.  

## 2022-09-10 NOTE — Progress Notes (Signed)
Patient presents today for treatment and follow visit with Dr. Delton Coombes. Labs reviewed from 09-09-2022 and within parameters for treatment. Patient has no complaints of any changes since her last treatment.

## 2022-09-10 NOTE — Progress Notes (Signed)
Patient has been assessed, vital signs and labs have been reviewed by Dr. Katragadda. ANC, Creatinine, LFTs, and Platelets are within treatment parameters per Dr. Katragadda. The patient is good to proceed with treatment at this time. Primary RN and pharmacy aware.  

## 2022-09-10 NOTE — Progress Notes (Signed)
[address] [place], [place] [address]   CLINIC:  Medical Oncology/Hematology  PCP:  Gerlene Fee, NP [address] [place] [address] [phone]   REASON FOR VISIT:  Follow-up for multiple myeloma  PRIOR THERAPY: none  NGS Results: not done  CURRENT THERAPY: Velcade & Decadron 3/4 weeks; Revlimid 2/4 weeks  BRIEF ONCOLOGIC HISTORY:  Oncology History  Multiple myeloma ([place])  07/09/2017 Initial Diagnosis   Multiple myeloma ([place])   06/01/2018 - 08/13/2022 Chemotherapy   Patient is on Treatment Plan : MYELOMA MAINTENANCE Bortezomib SQ D1,8,15 / Dexamethasone Oral D1,8,15 q28d      08/20/2022 -  Chemotherapy   Patient is on Treatment Plan : MYELOMA MAINTENANCE Bortezomib SQ D1,8,15 + Dexamethasone D1,8,15 q21d x 6 cycles       CANCER STAGING:  Cancer Staging  No matching staging information was found for the patient.  INTERVAL HISTORY:  Amanda Wu, a 76 y.o. female, seen for follow-up and toxicity assessment for multiple myeloma.  She is receiving Velcade injections 3 weeks on/1 week off.  Denies any GI side effects.  Energy levels are 50%.  No recent infections reported.   REVIEW OF SYSTEMS:  Review of Systems  Constitutional:  Negative for appetite change and fatigue.  Neurological:  Negative for numbness.  All other systems reviewed and are negative.   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[place])    Dementia ([place])    Diabetes mellitus with CKD    H/O partial nephrectomy    Hypokalemia    Hypothyroidism    Impaired cognition    Multiple myeloma (HCC)    Dr Maylon Peppers, [facility]   Past Surgical History:  Procedure Laterality Date   ABDOMINAL HYSTERECTOMY     CRANIOTOMY     for lymphoma   YAG LASER APPLICATION Right 4/76/5465   Procedure: YAG LASER APPLICATION;  Surgeon: [name], MD;  Location: [facility];   Service: Ophthalmology;  Laterality: Right;    SOCIAL HISTORY:  Social History   Socioeconomic History   Marital status: Single    Spouse name: Not on file   Number of children: Not on file   Years of education: Not on file   Highest education level: Not on file  Occupational History   Occupation: retired   Tobacco Use   Smoking status: Never   Smokeless tobacco: Never  Vaping Use   Vaping Use: Never used  Substance and Sexual Activity   Alcohol use: No   Drug use: No   Sexual activity: Never  Other Topics Concern   Not on file  Social History Narrative   Long term resident of [facility]    Social Determinants of Health   Financial Resource Strain: [place]  (10/16/2020)   Overall Financial Resource Strain (CARDIA)    Difficulty of Paying Living Expenses: Not very hard  Food Insecurity: No Food Insecurity (10/16/2020)   Hunger Vital Sign    Worried About Running Out of Food in the Last Year: Never true    Ran Out of Food in the Last Year: Never true  Transportation Needs: No Transportation Needs (10/16/2020)   PRAPARE - Hydrologist (Medical): No    Lack of Transportation (Non-Medical): No  Physical Activity: Inactive (10/16/2020)   Exercise Vital Sign    Days of Exercise  per Week: 0 days    Minutes of Exercise per Session: 0 min  Stress: No Stress Concern Present (10/16/2020)   [place]    Feeling of Stress : Not at all  Social Connections: Moderately Isolated (10/16/2020)   Social Connection and Isolation Panel [NHANES]    Frequency of Communication with Friends and Family: More than three times a week    Frequency of Social Gatherings with Friends and Family: Once a week    Attends Religious Services: More than 4 times per year    Active Member of [employer] or Organizations: No    Attends Archivist Meetings: Never    Marital Status: Never married  Human resources officer  Violence: Not on file    FAMILY HISTORY:  Family History  Problem Relation Age of Onset   Depression Mother    Diabetes Mother    Heart disease Father    Cancer - Prostate Brother    Cancer Brother    Cancer Sister     CURRENT MEDICATIONS:  No current facility-administered medications for this visit.   No current outpatient medications on file.   Facility-Administered Medications Ordered in Other Visits  Medication Dose Route Frequency Provider Last Rate Last Admin   cyanocobalamin ((VITAMIN B-12)) 1000 MCG/ML injection            cyanocobalamin ((VITAMIN B-12)) 1000 MCG/ML injection            denosumab (XGEVA) 120 MG/1.7ML injection            dexamethasone (DECADRON) 4 MG tablet            dexamethasone (DECADRON) 4 MG tablet            diphenhydrAMINE (BENADRYL) 50 MG/ML injection            diphenhydrAMINE (BENADRYL) 50 MG/ML injection            diphenhydrAMINE (BENADRYL) 50 MG/ML injection            epoetin alfa-epbx (RETACRIT) 94496 UNIT/ML injection            epoetin alfa-epbx (RETACRIT) 75916 UNIT/ML injection            epoetin alfa-epbx (RETACRIT) 38466 UNIT/ML injection            fulvestrant (FASLODEX) 250 MG/5ML injection            lanreotide acetate (SOMATULINE DEPOT) 120 MG/0.5ML injection            lanreotide acetate (SOMATULINE DEPOT) 120 MG/0.5ML injection            lanreotide acetate (SOMATULINE DEPOT) 120 MG/0.5ML injection            lanreotide acetate (SOMATULINE DEPOT) 120 MG/0.5ML injection            lidocaine (PF) (XYLOCAINE) 1 % injection            magnesium sulfate 2 GM/50ML IVPB            octreotide (SANDOSTATIN LAR) 30 MG IM injection            palonosetron (ALOXI) 0.25 MG/5ML injection            palonosetron (ALOXI) 0.25 MG/5ML injection            palonosetron (ALOXI) 0.25 MG/5ML injection            palonosetron (ALOXI) 0.25 MG/5ML injection  potassium chloride 10 MEQ/100ML IVPB            potassium chloride SA  (KLOR-CON M) 20 MEQ CR tablet            potassium chloride SA (KLOR-CON M) 20 MEQ CR tablet            prochlorperazine (COMPAZINE) 10 MG tablet            prochlorperazine (COMPAZINE) 10 MG tablet             ALLERGIES:  Allergies  Allergen Reactions   Lotensin [Benazepril]     PHYSICAL EXAM:  Performance status (ECOG): 1 - Symptomatic but completely ambulatory  Vitals:   09/10/22 1126  BP: 130/72  Pulse: 82  Resp: 18  Temp: 98.6 F (37 C)  SpO2: 100%   Wt Readings from Last 3 Encounters:  09/10/22 120 lb 12.8 oz (54.8 kg)  09/03/22 112 lb 1.6 oz (50.8 kg)  08/20/22 121 lb 7.6 oz (55.1 kg)   Physical Exam Vitals reviewed.  Constitutional:      Appearance: Normal appearance.  Cardiovascular:     Rate and Rhythm: Normal rate and regular rhythm.     Pulses: Normal pulses.     Heart sounds: Normal heart sounds.  Pulmonary:     Effort: Pulmonary effort is normal.     Breath sounds: Normal breath sounds.  Neurological:     General: No focal deficit present.     Mental Status: She is alert and oriented to person, place, and time.  Psychiatric:        Mood and Affect: Mood normal.        Behavior: Behavior normal.     LABORATORY DATA:  I have reviewed the labs as listed.     Latest Ref Rng & Units 09/09/2022   11:24 AM 09/03/2022   10:13 AM 08/20/2022   10:40 AM  CBC  WBC 4.0 - 10.5 K/uL 5.1  4.5  5.2   Hemoglobin 12.0 - 15.0 g/dL 11.3  10.5  11.7   Hematocrit 36.0 - 46.0 % 34.5  32.3  36.0   Platelets 150 - 400 K/uL 132  137  86       Latest Ref Rng & Units 09/09/2022   11:24 AM 09/03/2022   10:13 AM 08/20/2022   10:40 AM  CMP  Glucose 70 - 99 mg/dL 119  159  144   BUN 8 - 23 mg/dL '22  30  24   ' Creatinine 0.44 - 1.00 mg/dL 1.29  1.34  1.26   Sodium 135 - 145 mmol/L 143  141  143   Potassium 3.5 - 5.1 mmol/L 4.1  3.7  4.0   Chloride 98 - 111 mmol/L 112  113  113   CO2 22 - 32 mmol/L '24  22  23   ' Calcium 8.9 - 10.3 mg/dL 8.8  8.5  8.8   Total Protein 6.5 -  8.1 g/dL 6.6  6.4  6.4   Total Bilirubin 0.3 - 1.2 mg/dL 0.5  0.5  0.6   Alkaline Phos 38 - 126 U/L 85  83  77   AST 15 - 41 U/L 35  30  30   ALT 0 - 44 U/L '29  28  24     ' DIAGNOSTIC IMAGING:  I have independently reviewed the scans and discussed with the patient. No results found.   ASSESSMENT:  1.  IgG lambda multiple myeloma: -She is on Revlimid 10 mg  2 weeks on/2 weeks of along with Velcade 3 weeks on/1 week off.  Dexamethasone 10 mg on days of Velcade. -Myeloma panel on 04/17/2020 shows M spike 0.5 g.  Kappa light chains are 44.  Lambda light chains are 34.  Ratio is 1.3. -Resident of [facility] since 07/19/2020.   2.  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   PLAN:  1.  IgG lambda multiple myeloma: - She is receiving Velcade 3 weeks on/1 week off. - Reviewed myeloma labs from 09/03/2022 which showed M spike is stable at 0.5 g.  Lambda light chains are 40, ratio of 0.79.  Lambda light chain slightly increased from 26 previously. - Reviewed labs today which showed normal LFTs.  Creatinine 1.29 and stable.  CBC was grossly normal. - Continue Velcade 3 weeks on 1 week off regimen.  RTC 3 months for follow-up with repeat myeloma labs.   2.  CKD: - Baseline creatinine 1.2-1.3 stable.   3.  Diarrhea: -She had a history of intermittent diarrhea.  However she does not report any at this time.   4.  Dementia: - Continue Aricept 10 mg daily.   5.  Shingles prophylaxis: - Acyclovir 400 mg twice daily.   6.  Myeloma bone disease: - She has received Zometa for several years in the past which was discontinued.   Orders placed this encounter:  Orders Placed This Encounter  Procedures   CBC with Differential   Comprehensive metabolic panel   Magnesium   Lactate dehydrogenase   Protein electrophoresis, serum   Kappa/lambda light chains      Derek Jack, MD [place] [phone]

## 2022-09-10 NOTE — Patient Instructions (Signed)
[facility]  Discharge Instructions  You were seen and examined today by Dr. Delton Coombes.  Continue Velcade as planned.  Follow-up as scheduled.  Thank you for choosing [facility] to provide your oncology and hematology care.   To afford each patient quality time with our provider, please arrive at least 15 minutes before your scheduled appointment time. You may need to reschedule your appointment if you arrive late (10 or more minutes). Arriving late affects you and other patients whose appointments are after yours.  Also, if you miss three or more appointments without notifying the office, you may be dismissed from the clinic at the provider's discretion.    Again, thank you for choosing [facility].  Our hope is that these requests will decrease the amount of time that you wait before being seen by our physicians.   If you have a lab appointment with the [facility] please come in thru the Main Entrance and check in at the main information desk.           _____________________________________________________________  Should you have questions after your visit to [facility], please contact our office at [phone] and follow the prompts.  Our office hours are 8:00 a.m. to 4:30 p.m. Monday - Thursday and 8:00 a.m. to 2:30 p.m. Friday.  Please note that voicemails left after 4:00 p.m. may not be returned until the following business day.  We are closed weekends and all major holidays.  You do have access to a nurse 24-7, just call the main number to the clinic [phone] and do not press any options, hold on the line and a nurse will answer the phone.    For prescription refill requests, have your pharmacy contact our office and allow 72 hours.    Masks are optional in the cancer centers. If you would like for your care team to wear a mask while they are taking care of you, please let them know. You may  have one support person who is at least 78 years old accompany you for your appointments.

## 2022-09-11 ENCOUNTER — Other Ambulatory Visit: Payer: Self-pay

## 2022-09-12 ENCOUNTER — Other Ambulatory Visit (HOSPITAL_COMMUNITY)
Admission: RE | Admit: 2022-09-12 | Discharge: 2022-09-12 | Disposition: A | Discharge status: Home / Self Care | Payer: Medicare PPO | Source: Skilled Nursing Facility | Attending: Adult Health | Admitting: Adult Health

## 2022-09-12 DIAGNOSIS — E1159 Type 2 diabetes mellitus with other circulatory complications: Secondary | ICD-10-CM | POA: Insufficient documentation

## 2022-09-12 LAB — URINALYSIS, ROUTINE W REFLEX MICROSCOPIC
Bilirubin Urine: NEGATIVE
Glucose, UA: NEGATIVE mg/dL
Hgb urine dipstick: NEGATIVE
Ketones, ur: NEGATIVE mg/dL
Nitrite: NEGATIVE
Protein, ur: 30 mg/dL — AB
Specific Gravity, Urine: 1.018 (ref 1.005–1.030)
pH: 5 (ref 5.0–8.0)

## 2022-09-16 ENCOUNTER — Other Ambulatory Visit: Payer: Self-pay

## 2022-09-16 DIAGNOSIS — C9000 Multiple myeloma not having achieved remission: Secondary | ICD-10-CM

## 2022-09-16 DIAGNOSIS — Z23 Encounter for immunization: Secondary | ICD-10-CM | POA: Diagnosis not present

## 2022-09-17 ENCOUNTER — Non-Acute Institutional Stay (SKILLED_NURSING_FACILITY): Payer: Medicare PPO | Admitting: Adult Health

## 2022-09-17 ENCOUNTER — Inpatient Hospital Stay: Payer: Medicare PPO

## 2022-09-17 ENCOUNTER — Encounter: Payer: Self-pay | Admitting: Adult Health

## 2022-09-17 ENCOUNTER — Inpatient Hospital Stay: Payer: Medicare PPO | Attending: Hematology

## 2022-09-17 ENCOUNTER — Other Ambulatory Visit: Payer: Self-pay

## 2022-09-17 DIAGNOSIS — R627 Adult failure to thrive: Secondary | ICD-10-CM

## 2022-09-17 DIAGNOSIS — I129 Hypertensive chronic kidney disease with stage 1 through stage 4 chronic kidney disease, or unspecified chronic kidney disease: Secondary | ICD-10-CM | POA: Insufficient documentation

## 2022-09-17 DIAGNOSIS — B009 Herpesviral infection, unspecified: Secondary | ICD-10-CM | POA: Diagnosis not present

## 2022-09-17 DIAGNOSIS — C9000 Multiple myeloma not having achieved remission: Secondary | ICD-10-CM | POA: Insufficient documentation

## 2022-09-17 DIAGNOSIS — F0390 Unspecified dementia without behavioral disturbance: Secondary | ICD-10-CM | POA: Insufficient documentation

## 2022-09-17 DIAGNOSIS — E119 Type 2 diabetes mellitus without complications: Secondary | ICD-10-CM | POA: Insufficient documentation

## 2022-09-17 DIAGNOSIS — E876 Hypokalemia: Secondary | ICD-10-CM | POA: Diagnosis not present

## 2022-09-17 DIAGNOSIS — F01518 Vascular dementia, unspecified severity, with other behavioral disturbance: Secondary | ICD-10-CM | POA: Diagnosis not present

## 2022-09-17 DIAGNOSIS — E039 Hypothyroidism, unspecified: Secondary | ICD-10-CM | POA: Insufficient documentation

## 2022-09-17 DIAGNOSIS — N189 Chronic kidney disease, unspecified: Secondary | ICD-10-CM | POA: Insufficient documentation

## 2022-09-17 DIAGNOSIS — Z5112 Encounter for antineoplastic immunotherapy: Secondary | ICD-10-CM | POA: Insufficient documentation

## 2022-09-17 NOTE — Progress Notes (Unsigned)
Location:  [facility] Room Number: 148 Place of Service:  SNF (31) Ok Edwards, NP  CODE STATUS: DNR  Allergies  Allergen Reactions   Lotensin [Benazepril]     Chief Complaint  Patient presents with   Medical Management of Chronic Issues    Routine follow up visit.   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[place])    Dementia ([place])    Diabetes mellitus with CKD    H/O partial nephrectomy    Hypokalemia    Hypothyroidism    Impaired cognition    Multiple myeloma (HCC)    Dr Maylon Peppers, [facility]    Past Surgical History:  Procedure Laterality Date   ABDOMINAL HYSTERECTOMY     CRANIOTOMY     for lymphoma   YAG LASER APPLICATION Right 0/26/3785   Procedure: YAG LASER APPLICATION;  Surgeon: [name], MD;  Location: [facility];  Service: Ophthalmology;  Laterality: Right;    Social History   Socioeconomic History   Marital status: Single    Spouse name: Not on file   Number of children: Not on file   Years of education: Not on file   Highest education level: Not on file  Occupational History   Occupation: retired   Tobacco Use   Smoking status: Never   Smokeless tobacco: Never  Vaping Use   Vaping Use: Never used  Substance and Sexual Activity   Alcohol use: No   Drug use: No   Sexual activity: Never  Other Topics Concern   Not on file  Social History Narrative   Long term resident of [facility]    Social Determinants of Health   Financial Resource Strain: [place]  (10/16/2020)   Overall Financial Resource Strain (CARDIA)    Difficulty of Paying Living Expenses: Not very hard  Food Insecurity: No Food Insecurity (10/16/2020)   Hunger Vital Sign    Worried About Running Out of Food in the Last Year: Never true    Ran Out of Food in the Last Year: Never true   Transportation Needs: No Transportation Needs (10/16/2020)   PRAPARE - Hydrologist (Medical): No    Lack of Transportation (Non-Medical): No  Physical Activity: Inactive (10/16/2020)   Exercise Vital Sign    Days of Exercise per Week: 0 days    Minutes of Exercise per Session: 0 min  Stress: No Stress Concern Present (10/16/2020)   [place]    Feeling of Stress : Not at all  Social Connections: Moderately Isolated (10/16/2020)   Social Connection and Isolation Panel [NHANES]    Frequency of Communication with Friends and Family: More than three times a week    Frequency of Social Gatherings with Friends and Family: Once a week    Attends Religious Services: More than 4 times per year    Active Member of [employer] or Organizations: No    Attends Archivist Meetings: Never    Marital Status: Never married  Human resources officer Violence: Not on file   Family History  Problem Relation Age of Onset   Depression Mother    Diabetes Mother    Heart disease Father    Cancer - Prostate Brother  Cancer Brother    Cancer Sister       VITAL SIGNS BP (!) 129/54   Pulse 74   Temp (!) 97.4 F (36.3 C)   Resp 18   Ht '5\' 9"'  (1.753 m)   Wt 118 lb 3.2 oz (53.6 kg)   SpO2 96%   BMI 17.46 kg/m   Facility-Administered Encounter Medications as of 09/17/2022  Medication   cyanocobalamin ((VITAMIN B-12)) 1000 MCG/ML injection   cyanocobalamin ((VITAMIN B-12)) 1000 MCG/ML injection   denosumab (XGEVA) 120 MG/1.7ML injection   dexamethasone (DECADRON) 4 MG tablet   dexamethasone (DECADRON) 4 MG tablet   diphenhydrAMINE (BENADRYL) 50 MG/ML injection   diphenhydrAMINE (BENADRYL) 50 MG/ML injection   diphenhydrAMINE (BENADRYL) 50 MG/ML injection   epoetin alfa-epbx (RETACRIT) 92446 UNIT/ML injection   epoetin alfa-epbx (RETACRIT) 28638 UNIT/ML injection   epoetin alfa-epbx (RETACRIT) 17711  UNIT/ML injection   fulvestrant (FASLODEX) 250 MG/5ML injection   lanreotide acetate (SOMATULINE DEPOT) 120 MG/0.5ML injection   lanreotide acetate (SOMATULINE DEPOT) 120 MG/0.5ML injection   lanreotide acetate (SOMATULINE DEPOT) 120 MG/0.5ML injection   lanreotide acetate (SOMATULINE DEPOT) 120 MG/0.5ML injection   lidocaine (PF) (XYLOCAINE) 1 % injection   magnesium sulfate 2 GM/50ML IVPB   octreotide (SANDOSTATIN LAR) 30 MG IM injection   palonosetron (ALOXI) 0.25 MG/5ML injection   palonosetron (ALOXI) 0.25 MG/5ML injection   palonosetron (ALOXI) 0.25 MG/5ML injection   palonosetron (ALOXI) 0.25 MG/5ML injection   potassium chloride 10 MEQ/100ML IVPB   potassium chloride SA (KLOR-CON M) 20 MEQ CR tablet   potassium chloride SA (KLOR-CON M) 20 MEQ CR tablet   prochlorperazine (COMPAZINE) 10 MG tablet   prochlorperazine (COMPAZINE) 10 MG tablet   Outpatient Encounter Medications as of 09/17/2022  Medication Sig   acyclovir (ZOVIRAX) 400 MG tablet TAKE 1 TABLET BY MOUTH TWICE DAILY   alendronate (FOSAMAX) 70 MG tablet Take 70 mg by mouth once a week. Take with a full glass of water on an empty stomach.   aspirin EC 81 MG tablet Take 81 mg by mouth daily.   busPIRone (BUSPAR) 10 MG tablet Take 10 mg by mouth 3 (three) times daily. For anxiety   Calcium Carbonate-Vit D-Min (CALCIUM 600+D PLUS MINERALS) 600-400 MG-UNIT CHEW Chew 1 tablet by mouth daily.   Cholecalciferol (VITAMIN D) 50 MCG (2000 UT) CAPS Take by mouth daily.   cholestyramine (QUESTRAN) 4 g packet Take 4 g by mouth 2 (two) times daily between meals.   LACTOBACILLUS PROBIOTIC PO Take 2 capsules by mouth daily.   levothyroxine (SYNTHROID, LEVOTHROID) 25 MCG tablet Take 25 mcg by mouth daily before breakfast.   lipase/protease/amylase (CREON) 36000 UNITS CPEP capsule 36,000-114,000- 180,000 unit; amt: 1; oral Special Instructions: Take one capsule with snacks if eaten between meals. Twice A Day Between Meals as needed    lipase/protease/amylase (CREON) 36000 UNITS CPEP capsule Take 3 capsules by mouth 3 (three) times daily with meals.   loperamide (IMODIUM A-D) 2 MG tablet Take 2 mg by mouth 3 (three) times daily. For diarrhea   losartan (COZAAR) 50 MG tablet Take 1 tablet (50 mg total) by mouth daily.   NON FORMULARY Dysphagia 2 diet with thin liquids   NON FORMULARY Wanderguard #[id number] to ankle for safety awareness. Check placement and function qshift. Special Instructions: Check placement and function qshift. Every Shift Day, Evening, Night   potassium chloride SA (KLOR-CON) 20 MEQ tablet Take 20 mEq by mouth daily.    sertraline (ZOLOFT) 25 MG tablet Take 25 mg  by mouth daily.   [DISCONTINUED] acetaminophen (TYLENOL) 500 MG tablet Take 1,000 mg by mouth every 6 (six) hours as needed for mild pain.     SIGNIFICANT DIAGNOSTIC EXAMS       ASSESSMENT/ PLAN:     Ok Edwards NP [facility]  Contact [phone] Monday through Friday 8am- 5pm  After hours call [phone]

## 2022-09-22 DIAGNOSIS — F0390 Unspecified dementia without behavioral disturbance: Secondary | ICD-10-CM | POA: Diagnosis not present

## 2022-09-22 DIAGNOSIS — Z1159 Encounter for screening for other viral diseases: Secondary | ICD-10-CM | POA: Diagnosis not present

## 2022-09-24 ENCOUNTER — Other Ambulatory Visit: Payer: Medicare PPO

## 2022-09-24 ENCOUNTER — Ambulatory Visit: Payer: Medicare PPO

## 2022-09-24 DIAGNOSIS — F0390 Unspecified dementia without behavioral disturbance: Secondary | ICD-10-CM | POA: Diagnosis not present

## 2022-09-24 DIAGNOSIS — Z1159 Encounter for screening for other viral diseases: Secondary | ICD-10-CM | POA: Diagnosis not present

## 2022-09-26 ENCOUNTER — Non-Acute Institutional Stay (SKILLED_NURSING_FACILITY): Payer: Medicare PPO | Admitting: Adult Health

## 2022-09-26 ENCOUNTER — Encounter: Payer: Self-pay | Admitting: Adult Health

## 2022-09-26 DIAGNOSIS — Z0000 Encounter for general adult medical examination without abnormal findings: Secondary | ICD-10-CM | POA: Diagnosis not present

## 2022-09-26 NOTE — Patient Instructions (Signed)
  Amanda Wu , Thank you for taking time to come for your Medicare Wellness Visit. I appreciate your ongoing commitment to your health goals. Please review the following plan we discussed and let me know if I can assist you in the future.   These are the goals we discussed:  Goals      DIET - INCREASE WATER INTAKE     Follow up with Provider as scheduled     General - Client will not be readmitted within 30 days (C-SNP)        This is a list of the screening recommended for you and due dates:  Health Maintenance  Topic Date Due   COVID-19 Vaccine (6 - Moderna risk series) 02/04/2022   Yearly kidney health urinalysis for diabetes  09/05/2022   Zoster (Shingles) Vaccine (2 of 2) 09/30/2022*   Hemoglobin A1C  11/14/2022   Eye exam for diabetics  11/20/2022   Complete foot exam   01/02/2023   Yearly kidney function blood test for diabetes  09/10/2023   Tetanus Vaccine  08/25/2025   Pneumonia Vaccine  Completed   Flu Shot  Completed   DEXA scan (bone density measurement)  Completed   Hepatitis C Screening: USPSTF Recommendation to screen - Ages 43-79 yo.  Completed   HPV Vaccine  Aged Out  *Topic was postponed. The date shown is not the original due date.

## 2022-09-26 NOTE — Progress Notes (Signed)
Subjective:   Amanda Wu is a 76 y.o. female who presents for Medicare Annual (Subsequent) preventive examination.  Review of Systems    Review of Systems  Constitutional:  Negative for malaise/fatigue.  Respiratory:  Negative for cough and shortness of breath.   Cardiovascular:  Negative for chest pain, palpitations and leg swelling.  Gastrointestinal:  Negative for abdominal pain, constipation and heartburn.  Musculoskeletal:  Negative for back pain, joint pain and myalgias.  Skin: Negative.   Neurological:  Negative for dizziness.  Psychiatric/Behavioral:  The patient is not nervous/anxious.     Cardiac Risk Factors include: advanced age (>39mn, >>31women);diabetes mellitus;dyslipidemia;hypertension;sedentary lifestyle     Objective:    Today's Vitals   09/26/22 0817 09/26/22 1215  BP: (!) 140/77   Pulse: 70   Resp: 20   Temp: 98.3 F (36.8 C)   TempSrc: Temporal   SpO2: 100%   Weight: 118 lb 3.2 oz (53.6 kg)   Height: _0  (1.753 m)   PainSc:  0-No pain   Body mass index is 17.46 kg/m.     09/26/2022    8:26 AM 09/17/2022    1:29 PM 09/10/2022   11:28 AM 08/20/2022   11:30 AM 08/13/2022   11:27 AM 08/05/2022    9:09 AM 07/23/2022   10:25 AM  Advanced Directives  Does Patient Have a Medical Advance Directive? _1  Yes Yes  Type of [place]pink MOST or yellow form) Out of facility DNR (pink MOST or yellow form) [place] Living will;Healthcare Power of Attorney Out of facility DNR (pink MOST or yellow form) Out of facility DNR (pink MOST or yellow form) Living will;Healthcare Power of Attorney  Does patient want to make changes to medical advance directive? No - Patient declined No - Patient declined  No - Patient declined No - Patient declined No - Patient declined   Copy of [place] Chart? Yes - validated most recent copy scanned  in chart (See row information)  Yes - validated most recent copy scanned in chart (See row information)  Yes - validated most recent copy scanned in chart (See row information)  Yes - validated most recent copy scanned in chart (See row information)  Would patient like information on creating a medical advance directive? No - Patient declined  No - Patient declined No - Patient declined No - Patient declined  No - Patient declined  Pre-existing out of facility DNR order (yellow form or pink MOST form)  Yellow form placed in chart (order not valid for inpatient use) Yellow form placed in chart (order not valid for inpatient use)   Yellow form placed in chart (order not valid for inpatient use) Yellow form placed in chart (order not valid for inpatient use)    Current Medications (verified) Facility-Administered Encounter Medications as of 09/26/2022  Medication   cyanocobalamin ((VITAMIN B-12)) 1000 MCG/ML injection   cyanocobalamin ((VITAMIN B-12)) 1000 MCG/ML injection   denosumab (XGEVA) 120 MG/1.7ML injection   dexamethasone (DECADRON) 4 MG tablet   dexamethasone (DECADRON) 4 MG tablet   diphenhydrAMINE (BENADRYL) 50 MG/ML injection   diphenhydrAMINE (BENADRYL) 50 MG/ML injection   diphenhydrAMINE (BENADRYL) 50 MG/ML injection   epoetin alfa-epbx (RETACRIT) 293734UNIT/ML injection   epoetin alfa-epbx (RETACRIT) 228768UNIT/ML injection   epoetin alfa-epbx (RETACRIT) 411572UNIT/ML injection   fulvestrant (FASLODEX) 250 MG/5ML injection   lanreotide  acetate (SOMATULINE DEPOT) 120 MG/0.5ML injection   lanreotide acetate (SOMATULINE DEPOT) 120 MG/0.5ML injection   lanreotide acetate (SOMATULINE DEPOT) 120 MG/0.5ML injection   lanreotide acetate (SOMATULINE DEPOT) 120 MG/0.5ML injection   lidocaine (PF) (XYLOCAINE) 1 % injection   magnesium sulfate 2 GM/50ML IVPB   octreotide (SANDOSTATIN LAR) 30 MG IM injection   palonosetron (ALOXI) 0.25 MG/5ML injection   palonosetron (ALOXI) 0.25 MG/5ML  injection   palonosetron (ALOXI) 0.25 MG/5ML injection   palonosetron (ALOXI) 0.25 MG/5ML injection   potassium chloride 10 MEQ/100ML IVPB   potassium chloride SA (KLOR-CON M) 20 MEQ CR tablet   potassium chloride SA (KLOR-CON M) 20 MEQ CR tablet   prochlorperazine (COMPAZINE) 10 MG tablet   prochlorperazine (COMPAZINE) 10 MG tablet   Outpatient Encounter Medications as of 09/26/2022  Medication Sig   acyclovir (ZOVIRAX) 400 MG tablet TAKE 1 TABLET BY MOUTH TWICE DAILY   alendronate (FOSAMAX) 70 MG tablet Take 70 mg by mouth once a week. Take with a full glass of water on an empty stomach.   aspirin EC 81 MG tablet Take 81 mg by mouth daily.   busPIRone (BUSPAR) 10 MG tablet Take 10 mg by mouth 3 (three) times daily. For anxiety   Calcium Carbonate-Vit D-Min (CALCIUM 600+D PLUS MINERALS) 600-400 MG-UNIT CHEW Chew 1 tablet by mouth daily.   Cholecalciferol (VITAMIN D) 50 MCG (2000 UT) CAPS Take by mouth daily.   cholestyramine (QUESTRAN) 4 g packet Take 4 g by mouth 2 (two) times daily between meals.   LACTOBACILLUS PROBIOTIC PO Take 2 capsules by mouth daily.   levothyroxine (SYNTHROID, LEVOTHROID) 25 MCG tablet Take 25 mcg by mouth daily before breakfast.   lipase/protease/amylase (CREON) 36000 UNITS CPEP capsule 36,000-114,000- 180,000 unit; amt: 1; oral Special Instructions: Take one capsule with snacks if eaten between meals. Twice A Day Between Meals as needed   lipase/protease/amylase (CREON) 36000 UNITS CPEP capsule Take 3 capsules by mouth 3 (three) times daily with meals.   loperamide (IMODIUM A-D) 2 MG tablet Take 2 mg by mouth 3 (three) times daily. For diarrhea   losartan (COZAAR) 50 MG tablet Take 1 tablet (50 mg total) by mouth daily.   NON FORMULARY Dysphagia 2 diet with thin liquids   NON FORMULARY Wanderguard #[id number] to ankle for safety awareness. Check placement and function qshift. Special Instructions: Check placement and function qshift. Every Shift Day, Evening,  Night   potassium chloride SA (KLOR-CON) 20 MEQ tablet Take 20 mEq by mouth daily.    sertraline (ZOLOFT) 25 MG tablet Take 25 mg by mouth daily.    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[place])    Dementia ([place])    Diabetes mellitus with CKD    H/O partial nephrectomy    Hypokalemia    Hypothyroidism    Impaired cognition    Multiple myeloma (HCC)    Dr Maylon Peppers, [place]   Past Surgical History:  Procedure Laterality Date   ABDOMINAL HYSTERECTOMY     CRANIOTOMY     for lymphoma   YAG LASER APPLICATION Right 6/44/0347   Procedure: YAG LASER APPLICATION;  Surgeon: [name], MD;  Location: [facility];  Service: Ophthalmology;  Laterality: Right;   Family History  Problem Relation Age of Onset   Depression Mother    Diabetes Mother    Heart disease Father    Cancer -  Prostate Brother    Cancer Brother    Cancer Sister    Social History   Socioeconomic History   Marital status: Single    Spouse name: Not on file   Number of children: Not on file   Years of education: Not on file   Highest education level: Not on file  Occupational History   Occupation: retired   Tobacco Use   Smoking status: Never   Smokeless tobacco: Never  Vaping Use   Vaping Use: Never used  Substance and Sexual Activity   Alcohol use: No   Drug use: No   Sexual activity: Never  Other Topics Concern   Not on file  Social History Narrative   Long term resident of [facility]    Social Determinants of Health   Financial Resource Strain: [place]  (10/16/2020)   Overall Financial Resource Strain (CARDIA)    Difficulty of Paying Living Expenses: Not very hard  Food Insecurity: No Food Insecurity (10/16/2020)   Hunger Vital Sign    Worried About Running Out of Food in the Last Year: Never true    Ran Out of Food in the Last Year: Never true  Transportation Needs: No  Transportation Needs (10/16/2020)   PRAPARE - Hydrologist (Medical): No    Lack of Transportation (Non-Medical): No  Physical Activity: Inactive (10/16/2020)   Exercise Vital Sign    Days of Exercise per Week: 0 days    Minutes of Exercise per Session: 0 min  Stress: No Stress Concern Present (10/16/2020)   [place]    Feeling of Stress : Not at all  Social Connections: Moderately Isolated (10/16/2020)   Social Connection and Isolation Panel [NHANES]    Frequency of Communication with Friends and Family: More than three times a week    Frequency of Social Gatherings with Friends and Family: Once a week    Attends Religious Services: More than 4 times per year    Active Member of [employer] or Organizations: No    Attends Music therapist: Never    Marital Status: Never married    Tobacco Counseling Counseling given: Not Answered   Clinical Intake:  Pre-visit preparation completed: Yes  Pain : No/denies pain Pain Score: 0-No pain     BMI - recorded: 17.46 Nutritional Status: BMI <19  Underweight Nutritional Risks: Unintentional weight loss, Failure to thrive Diabetes: Yes CBG done?: Yes CBG resulted in Enter/ Edit results?: Yes Did pt. bring in CBG monitor from home?: No  How often do you need to have someone help you when you read instructions, pamphlets, or other written materials from your doctor or pharmacy?: 5 - Always  Diabetic?yes  Interpreter Needed?: No      Activities of Daily Living    09/26/2022   12:19 PM  In your present state of health, do you have any difficulty performing the following activities:  Hearing? 0  Vision? 0  Difficulty concentrating or making decisions? 1  Walking or climbing stairs? 1  Dressing or bathing? 1  Preparing Food and eating ? Y  Using the Toilet? Y  In the past six months, have you accidently leaked urine? Y  Do  you have problems with loss of bowel control? Y  Managing your Medications? Y  Managing your Finances? Y  Housekeeping or managing your Housekeeping? Y    Patient Care Team: Gerlene Fee, NP as PCP - [facility] (Geriatric  Medicine) [facility], [place] [facility]) Abbey Chatters, Elon Alas, DO as Consulting Physician (Internal Medicine)  Indicate any recent Medical Services you may have received from other than [facility] providers in the past year (date may be approximate).     Assessment:   This is a routine wellness examination for Jolonda.  Hearing/Vision screen No results found.  Dietary issues and exercise activities discussed: Current Exercise Habits: The patient does not participate in regular exercise at present, Exercise limited by: None identified   Goals Addressed             This Visit's Progress    DIET - INCREASE WATER INTAKE   On track    Follow up with Provider as scheduled   On track    General - Client will not be readmitted within 30 days ([facility])   On track      Depression Screen    09/26/2022   12:18 PM 02/05/2022    1:54 PM 01/02/2022    1:18 PM 09/24/2021   10:25 AM 10/16/2020    2:36 PM 08/15/2020   11:14 AM  PHQ 2/9 Scores  PHQ - 2 Score 0 0 0 0 0 0  PHQ- 9 Score 0 0 0       Fall Risk    09/26/2022   12:17 PM 09/26/2022    8:24 AM 02/05/2022    1:55 PM 01/02/2022    1:17 PM 09/24/2021   10:24 AM  Fall Risk   Falls in the past year? 0 0 0 0 1  Number falls in past yr: 0 0 0 0 1  Injury with Fall? 0 0 0 0 0  Risk for fall due to : History of fall(s);Impaired mobility History of fall(s) History of fall(s);Impaired balance/gait;Impaired mobility  History of fall(s);Impaired balance/gait;Impaired mobility  Follow up  Falls evaluation completed   Falls evaluation completed    FALL RISK PREVENTION PERTAINING TO THE HOME:  Any stairs in or around the home? Yes  If so, are there any without handrails? Yes  Home free of loose throw rugs in  walkways, pet beds, electrical cords, etc? Yes  Adequate lighting in your home to reduce risk of falls? Yes   ASSISTIVE DEVICES UTILIZED TO PREVENT FALLS:  Life alert? No  Use of a cane, walker or w/c? Yes  Grab bars in the bathroom? Yes  Shower chair or bench in shower? Yes  Elevated toilet seat or a handicapped toilet? Yes   TIMED UP AND GO:  Was the test performed? Yes .  Length of time to ambulate 10 feet: 10 sec.   Gait steady and fast without use of assistive device  Cognitive Function:    09/26/2022   12:19 PM 09/24/2021   10:25 AM  MMSE - Mini Mental State Exam  Not completed: Unable to complete Unable to complete        09/26/2022   12:20 PM 08/15/2020   11:15 AM  6CIT Screen  What Year? 0 points 0 points  What month? 3 points 0 points  What time? 3 points 0 points  Count back from 20 4 points 2 points  Months in reverse 4 points 2 points  Repeat phrase 6 points 4 points  Total Score 20 points 8 points    Immunizations Immunization History  Administered Date(s) Administered   Fluad Quad(high Dose 65+) 08/30/2019, 09/16/2022   Influenza, High Dose Seasonal PF 11/15/2015, 10/16/2016, 09/17/2017, 09/14/2018   Influenza,inj,Quad PF,6+ Mos 09/21/2014, 09/18/2021  Influenza-Unspecified 09/25/2011, 10/14/2012, 10/20/2013, 09/21/2014, 11/15/2015, 10/16/2016, 09/21/2020, 09/16/2022   Moderna Covid-19 Vaccine Bivalent Booster 49yr & up 12/10/2021   Moderna SARS-COV2 Booster Vaccination 07/31/2021, 10/08/2021   Moderna Sars-Covid-2 Vaccination 02/16/2020, 03/12/2020, 08/08/2020, 03/27/2021   PNEUMOCOCCAL CONJUGATE-20 09/20/2021   Pneumococcal Polysaccharide-23 07/10/2017   Pneumococcal-Unspecified 11/20/2010   Tdap 10/14/2012, 08/26/2015   Zoster Recombinat (Shingrix) 01/07/2022    TDAP status: Up to date  Flu Vaccine status: Up to date  Pneumococcal vaccine status: Up to date  Covid-19 vaccine status: Completed vaccines  Qualifies for Shingles Vaccine?  Yes   Zostavax completed Yes   Shingrix Completed?: Yes  Screening Tests Health Maintenance  Topic Date Due   COVID-19 Vaccine (6 - Moderna risk series) 02/04/2022   Diabetic kidney evaluation - Urine ACR  09/05/2022   Zoster Vaccines- Shingrix (2 of 2) 09/30/2022 (Originally 03/04/2022)   HEMOGLOBIN A1C  11/14/2022   OPHTHALMOLOGY EXAM  11/20/2022   FOOT EXAM  01/02/2023   Diabetic kidney evaluation - GFR measurement  09/10/2023   TETANUS/TDAP  08/25/2025   Pneumonia Vaccine 76 Years old  Completed   INFLUENZA VACCINE  Completed   DEXA SCAN  Completed   Hepatitis C Screening  Completed   HPV VACCINES  Aged Out    Health Maintenance  Health Maintenance Due  Topic Date Due   COVID-19 Vaccine (6 - Moderna risk series) 02/04/2022   Diabetic kidney evaluation - Urine ACR  09/05/2022    Colorectal cancer screening: No longer required.   Mammogram status: No longer required due to age.  Bone Density status: Completed 08-10-20. Results reflect: Bone density results: OSTEOPOROSIS. Repeat every 2 years.  Lung Cancer Screening: (Low Dose CT Chest recommended if Age 78-80years, 30 pack-year currently smoking OR have quit w/in 15years.) does not qualify.   Lung Cancer Screening Referral: n/a  Additional Screening:  Hepatitis C Screening: does not qualify; Completed   Vision Screening: Recommended annual ophthalmology exams for early detection of glaucoma and other disorders of the eye. Is the patient up to date with their annual eye exam?  Yes  Who is the provider or what is the name of the office in which the patient attends annual eye exams?  If pt is not established with a provider, would they like to be referred to a provider to establish care? No .   Dental Screening: Recommended annual dental exams for proper oral hygiene  Community Resource Referral / Chronic Care Management: CRR required this visit?  No   CCM required this visit?  No      Plan:     I have  personally reviewed and noted the following in the patient's chart:   Medical and social history Use of alcohol, tobacco or illicit drugs  Current medications and supplements including opioid prescriptions. Patient is not currently taking opioid prescriptions. Functional ability and status Nutritional status Physical activity Advanced directives List of other physicians Hospitalizations, surgeries, and ER visits in previous 12 months Vitals Screenings to include cognitive, depression, and falls Referrals and appointments  In addition, I have reviewed and discussed with patient certain preventive protocols, quality metrics, and best practice recommendations. A written personalized care plan for preventive services as well as general preventive health recommendations were provided to patient.     DGerlene Fee NP   09/26/2022   Nurse Notes: the exam was performed at this facility by myself

## 2022-10-02 ENCOUNTER — Inpatient Hospital Stay: Payer: Medicare PPO

## 2022-10-02 VITALS — BP 150/77 | HR 72 | Temp 98.0°F | Resp 18 | Wt 120.0 lb

## 2022-10-02 DIAGNOSIS — N189 Chronic kidney disease, unspecified: Secondary | ICD-10-CM | POA: Diagnosis not present

## 2022-10-02 DIAGNOSIS — F0390 Unspecified dementia without behavioral disturbance: Secondary | ICD-10-CM | POA: Diagnosis not present

## 2022-10-02 DIAGNOSIS — C9000 Multiple myeloma not having achieved remission: Secondary | ICD-10-CM

## 2022-10-02 DIAGNOSIS — I129 Hypertensive chronic kidney disease with stage 1 through stage 4 chronic kidney disease, or unspecified chronic kidney disease: Secondary | ICD-10-CM | POA: Diagnosis not present

## 2022-10-02 DIAGNOSIS — E039 Hypothyroidism, unspecified: Secondary | ICD-10-CM | POA: Diagnosis not present

## 2022-10-02 DIAGNOSIS — E119 Type 2 diabetes mellitus without complications: Secondary | ICD-10-CM | POA: Diagnosis not present

## 2022-10-02 DIAGNOSIS — Z5112 Encounter for antineoplastic immunotherapy: Secondary | ICD-10-CM | POA: Diagnosis not present

## 2022-10-02 LAB — CBC WITH DIFFERENTIAL/PLATELET
Abs Immature Granulocytes: 0.01 10*3/uL (ref 0.00–0.07)
Basophils Absolute: 0 10*3/uL (ref 0.0–0.1)
Basophils Relative: 1 %
Eosinophils Absolute: 0.2 10*3/uL (ref 0.0–0.5)
Eosinophils Relative: 5 %
HCT: 34.2 % — ABNORMAL LOW (ref 36.0–46.0)
Hemoglobin: 11.1 g/dL — ABNORMAL LOW (ref 12.0–15.0)
Immature Granulocytes: 0 %
Lymphocytes Relative: 16 %
Lymphs Abs: 0.7 10*3/uL (ref 0.7–4.0)
MCH: 32.6 pg (ref 26.0–34.0)
MCHC: 32.5 g/dL (ref 30.0–36.0)
MCV: 100.6 fL — ABNORMAL HIGH (ref 80.0–100.0)
Monocytes Absolute: 0.4 10*3/uL (ref 0.1–1.0)
Monocytes Relative: 11 %
Neutro Abs: 2.8 10*3/uL (ref 1.7–7.7)
Neutrophils Relative %: 67 %
Platelets: 111 10*3/uL — ABNORMAL LOW (ref 150–400)
RBC: 3.4 MIL/uL — ABNORMAL LOW (ref 3.87–5.11)
RDW: 16.3 % — ABNORMAL HIGH (ref 11.5–15.5)
WBC: 4.1 10*3/uL (ref 4.0–10.5)
nRBC: 0 % (ref 0.0–0.2)

## 2022-10-02 LAB — COMPREHENSIVE METABOLIC PANEL
ALT: 23 U/L (ref 0–44)
AST: 39 U/L (ref 15–41)
Albumin: 3.5 g/dL (ref 3.5–5.0)
Alkaline Phosphatase: 82 U/L (ref 38–126)
Anion gap: 7 (ref 5–15)
BUN: 25 mg/dL — ABNORMAL HIGH (ref 8–23)
CO2: 25 mmol/L (ref 22–32)
Calcium: 9.3 mg/dL (ref 8.9–10.3)
Chloride: 109 mmol/L (ref 98–111)
Creatinine, Ser: 1.23 mg/dL — ABNORMAL HIGH (ref 0.44–1.00)
GFR, Estimated: 45 mL/min — ABNORMAL LOW (ref >60)
Glucose, Bld: 75 mg/dL (ref 70–99)
Potassium: 3.7 mmol/L (ref 3.5–5.1)
Sodium: 141 mmol/L (ref 135–145)
Total Bilirubin: 0.6 mg/dL (ref 0.3–1.2)
Total Protein: 6.7 g/dL (ref 6.5–8.1)

## 2022-10-02 LAB — LACTATE DEHYDROGENASE: LDH: 225 U/L — ABNORMAL HIGH (ref 98–192)

## 2022-10-02 LAB — MAGNESIUM: Magnesium: 2 mg/dL (ref 1.7–2.4)

## 2022-10-02 MED ORDER — DEXAMETHASONE 4 MG PO TABS
10.0000 mg | ORAL_TABLET | Freq: Once | ORAL | Status: AC
  Administered 2022-10-02: 10 mg via ORAL
  Filled 2022-10-02: qty 3

## 2022-10-02 MED ORDER — BORTEZOMIB CHEMO SQ INJECTION 3.5 MG (2.5MG/ML)
1.3000 mg/m2 | Freq: Once | INTRAMUSCULAR | Status: AC
  Administered 2022-10-02: 2 mg via SUBCUTANEOUS
  Filled 2022-10-02: qty 0.8

## 2022-10-02 NOTE — Patient Instructions (Signed)
[facility]  Discharge Instructions: Thank you for choosing [facility] to provide your oncology and hematology care.  If you have a lab appointment with the Cancer Center, please come in thru the Main Entrance and check in at the main information desk.  Wear comfortable clothing and clothing appropriate for easy access to any Portacath or PICC line.   We strive to give you quality time with your provider. You may need to reschedule your appointment if you arrive late (15 or more minutes).  Arriving late affects you and other patients whose appointments are after yours.  Also, if you miss three or more appointments without notifying the office, you may be dismissed from the clinic at the provider's discretion.      For prescription refill requests, have your pharmacy contact our office and allow 72 hours for refills to be completed.    Today you received the following chemotherapy and/or immunotherapy agents Velcade      To help prevent nausea and vomiting after your treatment, we encourage you to take your nausea medication as directed.  BELOW ARE SYMPTOMS THAT SHOULD BE REPORTED IMMEDIATELY: *FEVER GREATER THAN 100.4 F (38 C) OR HIGHER *CHILLS OR SWEATING *NAUSEA AND VOMITING THAT IS NOT CONTROLLED WITH YOUR NAUSEA MEDICATION *UNUSUAL SHORTNESS OF BREATH *UNUSUAL BRUISING OR BLEEDING *URINARY PROBLEMS (pain or burning when urinating, or frequent urination) *BOWEL PROBLEMS (unusual diarrhea, constipation, pain near the anus) TENDERNESS IN MOUTH AND THROAT WITH OR WITHOUT PRESENCE OF ULCERS (sore throat, sores in mouth, or a toothache) UNUSUAL RASH, SWELLING OR PAIN  UNUSUAL VAGINAL DISCHARGE OR ITCHING   Items with * indicate a potential emergency and should be followed up as soon as possible or go to the Emergency Department if any problems should occur.  Please show the CHEMOTHERAPY ALERT CARD or IMMUNOTHERAPY ALERT CARD at check-in to the Emergency  Department and triage nurse.  Should you have questions after your visit or need to cancel or reschedule your appointment, please contact [facility] [phone]  and follow the prompts.  Office hours are 8:00 a.m. to 4:30 p.m. Monday - Friday. Please note that voicemails left after 4:00 p.m. may not be returned until the following business day.  We are closed weekends and major holidays. You have access to a nurse at all times for urgent questions. Please call the main number to the clinic [phone] and follow the prompts.  For any non-urgent questions, you may also contact your provider using MyChart. We now offer e-Visits for anyone 18 and older to request care online for non-urgent symptoms. For details visit [email or web address].   Also download the MyChart app! Go to the app store, search "MyChart", open the app, select [facility], and log in with your MyChart username and password.  Masks are optional in the cancer centers. If you would like for your care team to wear a mask while they are taking care of you, please let them know. You may have one support person who is at least 78 years old accompany you for your appointments.  

## 2022-10-02 NOTE — Progress Notes (Signed)
Patient presents today for Velcade injection per providers order.  Vital signs and labs within parameters for treatment.  Patient has no new complaints at this time.  Stable during administration without incident; injection site WNL; see MAR for injection details.  Patient tolerated procedure well and without incident.  No questions or complaints noted at this time.  

## 2022-10-03 ENCOUNTER — Non-Acute Institutional Stay (SKILLED_NURSING_FACILITY): Payer: Medicare PPO | Admitting: Adult Health

## 2022-10-03 ENCOUNTER — Encounter: Payer: Self-pay | Admitting: Adult Health

## 2022-10-03 DIAGNOSIS — I700 Atherosclerosis of aorta: Secondary | ICD-10-CM

## 2022-10-03 DIAGNOSIS — N183 Chronic kidney disease, stage 3 unspecified: Secondary | ICD-10-CM

## 2022-10-03 DIAGNOSIS — E1122 Type 2 diabetes mellitus with diabetic chronic kidney disease: Secondary | ICD-10-CM

## 2022-10-03 DIAGNOSIS — K8689 Other specified diseases of pancreas: Secondary | ICD-10-CM | POA: Diagnosis not present

## 2022-10-03 DIAGNOSIS — F01518 Vascular dementia, unspecified severity, with other behavioral disturbance: Secondary | ICD-10-CM | POA: Diagnosis not present

## 2022-10-03 LAB — KAPPA/LAMBDA LIGHT CHAINS
Kappa free light chain: 26.6 mg/L — ABNORMAL HIGH (ref 3.3–19.4)
Kappa, lambda light chain ratio: 0.94 (ref 0.26–1.65)
Lambda free light chains: 28.2 mg/L — ABNORMAL HIGH (ref 5.7–26.3)

## 2022-10-03 NOTE — Progress Notes (Unsigned)
Location:  [facility] Room Number: NO/148/W Place of Service:  SNF (31)   CODE STATUS: DNR  Allergies  Allergen Reactions   Lotensin [Benazepril]     Chief Complaint  Patient presents with   Acute Visit    HPI:    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HCC)    Dementia (HCC)    Diabetes mellitus with CKD    H/O partial nephrectomy    Hypokalemia    Hypothyroidism    Impaired cognition    Multiple myeloma (HCC)    Dr Kayleen Memos, [facility]    Past Surgical History:  Procedure Laterality Date   ABDOMINAL HYSTERECTOMY     CRANIOTOMY     for lymphoma   YAG LASER APPLICATION Right 07/02/2015   Procedure: YAG LASER APPLICATION;  Surgeon: Susa Simmonds, MD;  Location: [facility];  Service: Ophthalmology;  Laterality: Right;    Social History   Socioeconomic History   Marital status: Single    Spouse name: Not on file   Number of children: Not on file   Years of education: Not on file   Highest education level: Not on file  Occupational History   Occupation: retired   Tobacco Use   Smoking status: Never   Smokeless tobacco: Never  Vaping Use   Vaping Use: Never used  Substance and Sexual Activity   Alcohol use: No   Drug use: No   Sexual activity: Never  Other Topics Concern   Not on file  Social History Narrative   Long term resident of [facility]    Social Determinants of Health   Financial Resource Strain: Low Risk  (10/16/2020)   Overall Financial Resource Strain (CARDIA)    Difficulty of Paying Living Expenses: Not very hard  Food Insecurity: No Food Insecurity (10/16/2020)   Hunger Vital Sign    Worried About Running Out of Food in the Last Year: Never true    Ran Out of Food in the Last Year: Never true  Transportation Needs: No Transportation Needs (10/16/2020)   PRAPARE - Administrator, Civil Service (Medical): No    Lack of Transportation  (Non-Medical): No  Physical Activity: Inactive (10/16/2020)   Exercise Vital Sign    Days of Exercise per Week: 0 days    Minutes of Exercise per Session: 0 min  Stress: No Stress Concern Present (10/16/2020)   [employer] of Occupational Health - Occupational Stress Questionnaire    Feeling of Stress : Not at all  Social Connections: Moderately Isolated (10/16/2020)   Social Connection and Isolation Panel [NHANES]    Frequency of Communication with Friends and Family: More than three times a week    Frequency of Social Gatherings with Friends and Family: Once a week    Attends Religious Services: More than 4 times per year    Active Member of [employer] or Organizations: No    Attends Banker Meetings: Never    Marital Status: Never married  Catering manager Violence: Not on file   Family History  Problem Relation Age of Onset   Depression Mother    Diabetes Mother    Heart disease Father    Cancer - Prostate Brother    Cancer Brother    Cancer Sister       VITAL SIGNS BP 122/70   Pulse 68   Temp 98.1 F (36.7 C)  Resp 20   Ht $R'5\' 9"'mT$  (1.753 m)   Wt 118 lb (53.5 kg)   SpO2 95%   BMI 17.43 kg/m   Facility-Administered Encounter Medications as of 10/03/2022  Medication   cyanocobalamin ((VITAMIN B-12)) 1000 MCG/ML injection   cyanocobalamin ((VITAMIN B-12)) 1000 MCG/ML injection   denosumab (XGEVA) 120 MG/1.7ML injection   dexamethasone (DECADRON) 4 MG tablet   dexamethasone (DECADRON) 4 MG tablet   diphenhydrAMINE (BENADRYL) 50 MG/ML injection   diphenhydrAMINE (BENADRYL) 50 MG/ML injection   diphenhydrAMINE (BENADRYL) 50 MG/ML injection   epoetin alfa-epbx (RETACRIT) 00459 UNIT/ML injection   epoetin alfa-epbx (RETACRIT) 97741 UNIT/ML injection   epoetin alfa-epbx (RETACRIT) 42395 UNIT/ML injection   fulvestrant (FASLODEX) 250 MG/5ML injection   lanreotide acetate (SOMATULINE DEPOT) 120 MG/0.5ML injection   lanreotide acetate (SOMATULINE DEPOT) 120  MG/0.5ML injection   lanreotide acetate (SOMATULINE DEPOT) 120 MG/0.5ML injection   lanreotide acetate (SOMATULINE DEPOT) 120 MG/0.5ML injection   lidocaine (PF) (XYLOCAINE) 1 % injection   magnesium sulfate 2 GM/50ML IVPB   octreotide (SANDOSTATIN LAR) 30 MG IM injection   palonosetron (ALOXI) 0.25 MG/5ML injection   palonosetron (ALOXI) 0.25 MG/5ML injection   palonosetron (ALOXI) 0.25 MG/5ML injection   palonosetron (ALOXI) 0.25 MG/5ML injection   potassium chloride 10 MEQ/100ML IVPB   potassium chloride SA (KLOR-CON M) 20 MEQ CR tablet   potassium chloride SA (KLOR-CON M) 20 MEQ CR tablet   prochlorperazine (COMPAZINE) 10 MG tablet   prochlorperazine (COMPAZINE) 10 MG tablet   Outpatient Encounter Medications as of 10/03/2022  Medication Sig   acyclovir (ZOVIRAX) 400 MG tablet TAKE 1 TABLET BY MOUTH TWICE DAILY   alendronate (FOSAMAX) 70 MG tablet Take 70 mg by mouth once a week. Take with a full glass of water on an empty stomach.   aspirin EC 81 MG tablet Take 81 mg by mouth daily.   busPIRone (BUSPAR) 10 MG tablet Take 10 mg by mouth 3 (three) times daily. For anxiety   Calcium Carbonate-Vit D-Min (CALCIUM 600+D PLUS MINERALS) 600-400 MG-UNIT CHEW Chew 1 tablet by mouth daily.   Cholecalciferol (VITAMIN D) 50 MCG (2000 UT) CAPS Take by mouth daily.   cholestyramine (QUESTRAN) 4 g packet Take 4 g by mouth 2 (two) times daily between meals.   LACTOBACILLUS PROBIOTIC PO Take 2 capsules by mouth daily.   levothyroxine (SYNTHROID, LEVOTHROID) 25 MCG tablet Take 25 mcg by mouth daily before breakfast.   lipase/protease/amylase (CREON) 36000 UNITS CPEP capsule 36,000-114,000- 180,000 unit; amt: 1; oral Special Instructions: Take one capsule with snacks if eaten between meals. Twice A Day Between Meals as needed   lipase/protease/amylase (CREON) 36000 UNITS CPEP capsule Take 3 capsules by mouth 3 (three) times daily with meals.   loperamide (IMODIUM A-D) 2 MG tablet Take 2 mg by mouth 3  (three) times daily. For diarrhea   losartan (COZAAR) 50 MG tablet Take 1 tablet (50 mg total) by mouth daily.   NON FORMULARY Dysphagia 2 diet with thin liquids   NON FORMULARY Wanderguard #2237 to ankle for safety awareness. Check placement and function qshift. Special Instructions: Check placement and function qshift. Every Shift Day, Evening, Night   potassium chloride SA (KLOR-CON) 20 MEQ tablet Take 20 mEq by mouth daily.    sertraline (ZOLOFT) 25 MG tablet Take 25 mg by mouth daily.     SIGNIFICANT DIAGNOSTIC EXAMS       ASSESSMENT/ PLAN:     Ok Edwards NP [facility]  Contact [phone] Monday through Friday  8am- 5pm  After hours call [phone]

## 2022-10-07 LAB — PROTEIN ELECTROPHORESIS, SERUM
A/G Ratio: 1.2 (ref 0.7–1.7)
Albumin ELP: 3.3 g/dL (ref 2.9–4.4)
Alpha-1-Globulin: 0.2 g/dL (ref 0.0–0.4)
Alpha-2-Globulin: 0.6 g/dL (ref 0.4–1.0)
Beta Globulin: 0.7 g/dL (ref 0.7–1.3)
Gamma Globulin: 1.2 g/dL (ref 0.4–1.8)
Globulin, Total: 2.8 g/dL (ref 2.2–3.9)
M-Spike, %: 0.7 g/dL — ABNORMAL HIGH
Total Protein ELP: 6.1 g/dL (ref 6.0–8.5)

## 2022-10-08 ENCOUNTER — Other Ambulatory Visit: Payer: Self-pay

## 2022-10-09 ENCOUNTER — Inpatient Hospital Stay: Payer: Medicare PPO

## 2022-10-09 ENCOUNTER — Non-Acute Institutional Stay (SKILLED_NURSING_FACILITY): Payer: Medicare PPO | Admitting: Adult Health

## 2022-10-09 ENCOUNTER — Inpatient Hospital Stay (HOSPITAL_BASED_OUTPATIENT_CLINIC_OR_DEPARTMENT_OTHER): Payer: Medicare PPO | Admitting: Hematology

## 2022-10-09 VITALS — BP 148/95 | HR 76 | Temp 97.1°F | Resp 18 | Wt 116.8 lb

## 2022-10-09 DIAGNOSIS — M810 Age-related osteoporosis without current pathological fracture: Secondary | ICD-10-CM | POA: Diagnosis not present

## 2022-10-09 DIAGNOSIS — E039 Hypothyroidism, unspecified: Secondary | ICD-10-CM | POA: Diagnosis not present

## 2022-10-09 DIAGNOSIS — N189 Chronic kidney disease, unspecified: Secondary | ICD-10-CM | POA: Diagnosis not present

## 2022-10-09 DIAGNOSIS — N1832 Chronic kidney disease, stage 3b: Secondary | ICD-10-CM | POA: Diagnosis not present

## 2022-10-09 DIAGNOSIS — I129 Hypertensive chronic kidney disease with stage 1 through stage 4 chronic kidney disease, or unspecified chronic kidney disease: Secondary | ICD-10-CM | POA: Diagnosis not present

## 2022-10-09 DIAGNOSIS — N183 Chronic kidney disease, stage 3 unspecified: Secondary | ICD-10-CM

## 2022-10-09 DIAGNOSIS — Z5112 Encounter for antineoplastic immunotherapy: Secondary | ICD-10-CM | POA: Diagnosis not present

## 2022-10-09 DIAGNOSIS — E119 Type 2 diabetes mellitus without complications: Secondary | ICD-10-CM | POA: Diagnosis not present

## 2022-10-09 DIAGNOSIS — C9000 Multiple myeloma not having achieved remission: Secondary | ICD-10-CM

## 2022-10-09 DIAGNOSIS — F0390 Unspecified dementia without behavioral disturbance: Secondary | ICD-10-CM | POA: Diagnosis not present

## 2022-10-09 DIAGNOSIS — E1122 Type 2 diabetes mellitus with diabetic chronic kidney disease: Secondary | ICD-10-CM | POA: Diagnosis not present

## 2022-10-09 LAB — CBC WITH DIFFERENTIAL/PLATELET
Abs Immature Granulocytes: 0.01 10*3/uL (ref 0.00–0.07)
Basophils Absolute: 0 10*3/uL (ref 0.0–0.1)
Basophils Relative: 1 %
Eosinophils Absolute: 0.2 10*3/uL (ref 0.0–0.5)
Eosinophils Relative: 5 %
HCT: 35 % — ABNORMAL LOW (ref 36.0–46.0)
Hemoglobin: 11.3 g/dL — ABNORMAL LOW (ref 12.0–15.0)
Immature Granulocytes: 0 %
Lymphocytes Relative: 9 %
Lymphs Abs: 0.4 10*3/uL — ABNORMAL LOW (ref 0.7–4.0)
MCH: 32.8 pg (ref 26.0–34.0)
MCHC: 32.3 g/dL (ref 30.0–36.0)
MCV: 101.7 fL — ABNORMAL HIGH (ref 80.0–100.0)
Monocytes Absolute: 0.5 10*3/uL (ref 0.1–1.0)
Monocytes Relative: 10 %
Neutro Abs: 3.2 10*3/uL (ref 1.7–7.7)
Neutrophils Relative %: 75 %
Platelets: 93 10*3/uL — ABNORMAL LOW (ref 150–400)
RBC: 3.44 MIL/uL — ABNORMAL LOW (ref 3.87–5.11)
RDW: 16.4 % — ABNORMAL HIGH (ref 11.5–15.5)
WBC: 4.3 10*3/uL (ref 4.0–10.5)
nRBC: 0 % (ref 0.0–0.2)

## 2022-10-09 LAB — COMPREHENSIVE METABOLIC PANEL
ALT: 23 U/L (ref 0–44)
AST: 33 U/L (ref 15–41)
Albumin: 3.3 g/dL — ABNORMAL LOW (ref 3.5–5.0)
Alkaline Phosphatase: 75 U/L (ref 38–126)
Anion gap: 6 (ref 5–15)
BUN: 21 mg/dL (ref 8–23)
CO2: 25 mmol/L (ref 22–32)
Calcium: 9 mg/dL (ref 8.9–10.3)
Chloride: 115 mmol/L — ABNORMAL HIGH (ref 98–111)
Creatinine, Ser: 1.15 mg/dL — ABNORMAL HIGH (ref 0.44–1.00)
GFR, Estimated: 49 mL/min — ABNORMAL LOW (ref >60)
Glucose, Bld: 147 mg/dL — ABNORMAL HIGH (ref 70–99)
Potassium: 3.6 mmol/L (ref 3.5–5.1)
Sodium: 146 mmol/L — ABNORMAL HIGH (ref 135–145)
Total Bilirubin: 0.5 mg/dL (ref 0.3–1.2)
Total Protein: 6.8 g/dL (ref 6.5–8.1)

## 2022-10-09 MED ORDER — DEXAMETHASONE 4 MG PO TABS
10.0000 mg | ORAL_TABLET | Freq: Once | ORAL | Status: AC
  Administered 2022-10-09: 10 mg via ORAL
  Filled 2022-10-09: qty 3

## 2022-10-09 MED ORDER — BORTEZOMIB CHEMO SQ INJECTION 3.5 MG (2.5MG/ML)
1.3000 mg/m2 | Freq: Once | INTRAMUSCULAR | Status: AC
  Administered 2022-10-09: 2 mg via SUBCUTANEOUS
  Filled 2022-10-09: qty 0.8

## 2022-10-09 NOTE — Progress Notes (Signed)
Amanda Wu presents today for Velcade injection per the provider's orders.  Stable during administration without incident; injection site WNL; see MAR for injection details.  Patient tolerated procedure well and without incident.  No questions or complaints noted at this time.

## 2022-10-09 NOTE — Patient Instructions (Signed)
[facility]  Discharge Instructions: Thank you for choosing [facility] to provide your oncology and hematology care.  If you have a lab appointment with the [facility], please come in thru the Main Entrance and check in at the main information desk.  Wear comfortable clothing and clothing appropriate for easy access to any Portacath or PICC line.   We strive to give you quality time with your provider. You may need to reschedule your appointment if you arrive late (15 or more minutes).  Arriving late affects you and other patients whose appointments are after yours.  Also, if you miss three or more appointments without notifying the office, you may be dismissed from the clinic at the provider's discretion.      For prescription refill requests, have your pharmacy contact our office and allow 72 hours for refills to be completed.    Today you received the following chemotherapy and/or immunotherapy agents Velcade      To help prevent nausea and vomiting after your treatment, we encourage you to take your nausea medication as directed.  BELOW ARE SYMPTOMS THAT SHOULD BE REPORTED IMMEDIATELY: *FEVER GREATER THAN 100.4 F (38 C) OR HIGHER *CHILLS OR SWEATING *NAUSEA AND VOMITING THAT IS NOT CONTROLLED WITH YOUR NAUSEA MEDICATION *UNUSUAL SHORTNESS OF BREATH *UNUSUAL BRUISING OR BLEEDING *URINARY PROBLEMS (pain or burning when urinating, or frequent urination) *BOWEL PROBLEMS (unusual diarrhea, constipation, pain near the anus) TENDERNESS IN MOUTH AND THROAT WITH OR WITHOUT PRESENCE OF ULCERS (sore throat, sores in mouth, or a toothache) UNUSUAL RASH, SWELLING OR PAIN  UNUSUAL VAGINAL DISCHARGE OR ITCHING   Items with * indicate a potential emergency and should be followed up as soon as possible or go to the Emergency Department if any problems should occur.  Please show the CHEMOTHERAPY ALERT CARD or IMMUNOTHERAPY ALERT CARD at check-in to the Emergency  Department and triage nurse.  Should you have questions after your visit or need to cancel or reschedule your appointment, please contact [facility] [phone]  and follow the prompts.  Office hours are 8:00 a.m. to 4:30 p.m. Monday - Friday. Please note that voicemails left after 4:00 p.m. may not be returned until the following business day.  We are closed weekends and major holidays. You have access to a nurse at all times for urgent questions. Please call the main number to the clinic [phone] and follow the prompts.  For any non-urgent questions, you may also contact your provider using MyChart. We now offer e-Visits for anyone 35 and older to request care online for non-urgent symptoms. For details visit [email or web address].   Also download the MyChart app! Go to the app store, search "MyChart", open the app, select [facility], and log in with your MyChart username and password.  Masks are optional in the cancer centers. If you would like for your care team to wear a mask while they are taking care of you, please let them know. You may have one support person who is at least 78 years old accompany you for your appointments.

## 2022-10-09 NOTE — Patient Instructions (Signed)
[facility] at [facility] Discharge Instructions   You were seen and examined today by Dr. Katragadda.  He reviewed the results of your lab work which are normal/stable.   We will proceed with your treatment today.  Return as scheduled.    Thank you for choosing [facility] at [facility] to provide your oncology and hematology care.  To afford each patient quality time with our provider, please arrive at least 15 minutes before your scheduled appointment time.   If you have a lab appointment with the Cancer Center please come in thru the Main Entrance and check in at the main information desk.  You need to re-schedule your appointment should you arrive 10 or more minutes late.  We strive to give you quality time with our providers, and arriving late affects you and other patients whose appointments are after yours.  Also, if you no show three or more times for appointments you may be dismissed from the clinic at the providers discretion.     Again, thank you for choosing [facility].  Our hope is that these requests will decrease the amount of time that you wait before being seen by our physicians.       _____________________________________________________________  Should you have questions after your visit to [facility], please contact our office at [phone] and follow the prompts.  Our office hours are 8:00 a.m. and 4:30 p.m. Monday - Friday.  Please note that voicemails left after 4:00 p.m. may not be returned until the following business day.  We are closed weekends and major holidays.  You do have access to a nurse 24-7, just call the main number to the clinic [phone] and do not press any options, hold on the line and a nurse will answer the phone.    For prescription refill requests, have your pharmacy contact our office and allow 72 hours.    Due to Covid, you will need to wear a mask upon entering  the hospital. If you do not have a mask, a mask will be given to you at the Main Entrance upon arrival. For doctor visits, patients may have 1 support person age 18 or older with them. For treatment visits, patients can not have anyone with them due to social distancing guidelines and our immunocompromised population.      

## 2022-10-09 NOTE — Progress Notes (Signed)
Patient has been examined by Dr. Katragadda, and vital signs and labs have been reviewed. ANC, Creatinine, LFTs, hemoglobin, and platelets are within treatment parameters per M.D. - pt may proceed with treatment.  Primary RN and pharmacy notified.  

## 2022-10-09 NOTE — Progress Notes (Signed)
Amanda Wu, [place] [address]   CLINIC:  Medical Oncology/Hematology  PCP:  Gerlene Fee, NP [address] [place] [address] [phone]   REASON FOR VISIT:  Follow-up for multiple myeloma  PRIOR THERAPY: none  NGS Results: not done  CURRENT THERAPY: Velcade & Decadron 3/4 weeks; Revlimid 2/4 weeks  BRIEF ONCOLOGIC HISTORY:  Oncology History  Multiple myeloma ([facility])  07/09/2017 Initial Diagnosis   Multiple myeloma ([facility])   06/01/2018 - 08/13/2022 Chemotherapy   Patient is on Treatment Plan : MYELOMA MAINTENANCE Bortezomib SQ D1,8,15 / Dexamethasone Oral D1,8,15 q28d      08/20/2022 -  Chemotherapy   Patient is on Treatment Plan : MYELOMA MAINTENANCE Bortezomib SQ D1,8,15 + Dexamethasone D1,8,15 q21d x 6 cycles       CANCER STAGING:  Cancer Staging  No matching staging information was found for the patient.  INTERVAL HISTORY:  Amanda Wu, a 76 y.o. female, seen for follow-up and toxicity assessment for multiple myeloma treatments.  She is receiving Velcade injections 3 weeks on/1 week off.  She denies any diarrhea or constipation.  Denies any tingling or numbness in extremities.  Energy levels are reported as 75%.  No recent infections in the last 3 months.   REVIEW OF SYSTEMS:  Review of Systems  Constitutional:  Negative for appetite change and fatigue.  Cardiovascular:  Positive for leg swelling.  Neurological:  Negative for numbness.  All other systems reviewed and are negative.   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[facility])    Dementia ([facility])    Diabetes mellitus with CKD    H/O partial nephrectomy    Hypokalemia    Hypothyroidism    Impaired cognition    Multiple myeloma (HCC)    Dr Maylon Peppers, [facility]   Past Surgical History:  Procedure Laterality Date   ABDOMINAL HYSTERECTOMY     CRANIOTOMY      for lymphoma   YAG LASER APPLICATION Right 2/95/6213   Procedure: YAG LASER APPLICATION;  Surgeon: [name], MD;  Location: [facility];  Service: Ophthalmology;  Laterality: Right;    SOCIAL HISTORY:  Social History   Socioeconomic History   Marital status: Single    Spouse name: Not on file   Number of children: Not on file   Years of education: Not on file   Highest education level: Not on file  Occupational History   Occupation: retired   Tobacco Use   Smoking status: Never   Smokeless tobacco: Never  Vaping Use   Vaping Use: Never used  Substance and Sexual Activity   Alcohol use: No   Drug use: No   Sexual activity: Never  Other Topics Concern   Not on file  Social History Narrative   Long term resident of [facility]    Social Determinants of Health   Financial Resource Strain: [place]  (10/16/2020)   Overall Financial Resource Strain (CARDIA)    Difficulty of Paying Living Expenses: Not very hard  Food Insecurity: No Food Insecurity (10/16/2020)   Hunger Vital Sign    Worried About Running Out of Food in the Last Year: Never true    Ran Out of Food in the Last Year: Never true  Transportation Needs: No Transportation Needs (10/16/2020)   PRAPARE - Hydrologist (Medical): No  Lack of Transportation (Non-Medical): No  Physical Activity: Inactive (10/16/2020)   Exercise Vital Sign    Days of Exercise per Week: 0 days    Minutes of Exercise per Session: 0 min  Stress: No Stress Concern Present (10/16/2020)   [place]    Feeling of Stress : Not at all  Social Connections: Moderately Isolated (10/16/2020)   Social Connection and Isolation Panel [NHANES]    Frequency of Communication with Friends and Family: More than three times a week    Frequency of Social Gatherings with Friends and Family: Once a week    Attends Religious Services: More than 4 times per year    Active  Member of [employer] or Organizations: No    Attends Archivist Meetings: Never    Marital Status: Never married  Human resources officer Violence: Not on file    FAMILY HISTORY:  Family History  Problem Relation Age of Onset   Depression Mother    Diabetes Mother    Heart disease Father    Cancer - Prostate Brother    Cancer Brother    Cancer Sister     CURRENT MEDICATIONS:  No current facility-administered medications for this visit.   No current outpatient medications on file.   Facility-Administered Medications Ordered in Other Visits  Medication Dose Route Frequency Provider Last Rate Last Admin   cyanocobalamin ((VITAMIN B-12)) 1000 MCG/ML injection            cyanocobalamin ((VITAMIN B-12)) 1000 MCG/ML injection            denosumab (XGEVA) 120 MG/1.7ML injection            dexamethasone (DECADRON) 4 MG tablet            dexamethasone (DECADRON) 4 MG tablet            diphenhydrAMINE (BENADRYL) 50 MG/ML injection            diphenhydrAMINE (BENADRYL) 50 MG/ML injection            diphenhydrAMINE (BENADRYL) 50 MG/ML injection            epoetin alfa-epbx (RETACRIT) 94765 UNIT/ML injection            epoetin alfa-epbx (RETACRIT) 46503 UNIT/ML injection            epoetin alfa-epbx (RETACRIT) 54656 UNIT/ML injection            fulvestrant (FASLODEX) 250 MG/5ML injection            lanreotide acetate (SOMATULINE DEPOT) 120 MG/0.5ML injection            lanreotide acetate (SOMATULINE DEPOT) 120 MG/0.5ML injection            lanreotide acetate (SOMATULINE DEPOT) 120 MG/0.5ML injection            lanreotide acetate (SOMATULINE DEPOT) 120 MG/0.5ML injection            lidocaine (PF) (XYLOCAINE) 1 % injection            magnesium sulfate 2 GM/50ML IVPB            octreotide (SANDOSTATIN LAR) 30 MG IM injection            palonosetron (ALOXI) 0.25 MG/5ML injection            palonosetron (ALOXI) 0.25 MG/5ML injection            palonosetron (ALOXI) 0.25 MG/5ML injection  palonosetron (ALOXI) 0.25 MG/5ML injection            potassium chloride 10 MEQ/100ML IVPB            potassium chloride SA (KLOR-CON M) 20 MEQ CR tablet            potassium chloride SA (KLOR-CON M) 20 MEQ CR tablet            prochlorperazine (COMPAZINE) 10 MG tablet            prochlorperazine (COMPAZINE) 10 MG tablet             ALLERGIES:  Allergies  Allergen Reactions   Lotensin [Benazepril]     PHYSICAL EXAM:  Performance status (ECOG): 1 - Symptomatic but completely ambulatory  Vitals:   10/09/22 1108  BP: (!) 148/95  Pulse: 76  Resp: 18  Temp: (!) 97.1 F (36.2 C)  SpO2: 97%   Wt Readings from Last 3 Encounters:  10/09/22 116 lb 13.5 oz (53 kg)  10/03/22 118 lb (53.5 kg)  10/02/22 120 lb (54.4 kg)   Physical Exam Vitals reviewed.  Constitutional:      Appearance: Normal appearance.  Cardiovascular:     Rate and Rhythm: Normal rate and regular rhythm.     Pulses: Normal pulses.     Heart sounds: Normal heart sounds.  Pulmonary:     Effort: Pulmonary effort is normal.     Breath sounds: Normal breath sounds.  Neurological:     General: No focal deficit present.     Mental Status: She is alert and oriented to person, place, and time.  Psychiatric:        Mood and Affect: Mood normal.        Behavior: Behavior normal.     LABORATORY DATA:  I have reviewed the labs as listed.     Latest Ref Rng & Units 10/09/2022   10:46 AM 10/02/2022    1:24 PM 09/09/2022   11:24 AM  CBC  WBC 4.0 - 10.5 K/uL 4.3  4.1  5.1   Hemoglobin 12.0 - 15.0 g/dL 11.3  11.1  11.3   Hematocrit 36.0 - 46.0 % 35.0  34.2  34.5   Platelets 150 - 400 K/uL 93  111  132       Latest Ref Rng & Units 10/09/2022   10:46 AM 10/02/2022    1:24 PM 09/09/2022   11:24 AM  CMP  Glucose 70 - 99 mg/dL 147  75  119   BUN 8 - 23 mg/dL _0 Creatinine 0.44 - 1.00 mg/dL 1.15  1.23  1.29   Sodium 135 - 145 mmol/L 146  141  143   Potassium 3.5 - 5.1 mmol/L 3.6  3.7  4.1    Chloride 98 - 111 mmol/L 115  109  112   CO2 22 - 32 mmol/L _1 Calcium 8.9 - 10.3 mg/dL 9.0  9.3  8.8   Total Protein 6.5 - 8.1 g/dL 6.8  6.7  6.6   Total Bilirubin 0.3 - 1.2 mg/dL 0.5  0.6  0.5   Alkaline Phos 38 - 126 U/L 75  82  85   AST 15 - 41 U/L 33  39  35   ALT 0 - 44 U/L _2 DIAGNOSTIC IMAGING:  I have independently reviewed the scans and discussed with the patient. No results found.  ASSESSMENT:  1.  IgG lambda multiple myeloma: -She is on Revlimid 10 mg 2 weeks on/2 weeks of along with Velcade 3 weeks on/1 week off.  Dexamethasone 10 mg on days of Velcade. -Myeloma panel on 04/17/2020 shows M spike 0.5 g.  Kappa light chains are 44.  Lambda light chains are 34.  Ratio is 1.3. -Resident of [facility] since 07/19/2020.   2.  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   PLAN:  1.  IgG lambda multiple myeloma: - She is receiving Velcade 3 weeks on/1 week off. - Reviewed myeloma labs from 10/02/2022.  M spike has slightly increased to 0.7 from 0.5 g.  However lambda light chains have improved to 28 from 40. - CBC was grossly normal with mild to moderate thrombocytopenia.  LFTs are normal. - Previous myeloma labs showed M spike has gone up to 0.7 but has improved subsequently.  Hence I would not make any changes at this time.  Continue Velcade 3 weeks on 1 week off along with dexamethasone 10 mg on treatment days. - RTC 3 months for follow-up with repeat myeloma labs.   2.  CKD: - Baseline creatinine is stable between 1.1-1.3.   3.  Diarrhea: - She has intermittent mild diarrhea which is not affecting her lifestyle.   4.  Dementia: - Continue Aricept 10 mg daily.   5.  Shingles prophylaxis: - Continue acyclovir 400 mg twice daily.   6.  Myeloma bone disease: - She has received Zometa for several years in the past which was discontinued.   Orders placed this encounter:  Orders Placed This Encounter  Procedures    Kappa/lambda light chains   Protein electrophoresis, serum      Derek Jack, MD [facility] [phone]

## 2022-10-10 ENCOUNTER — Encounter: Payer: Self-pay | Admitting: Adult Health

## 2022-10-10 ENCOUNTER — Other Ambulatory Visit: Payer: Self-pay

## 2022-10-10 NOTE — Progress Notes (Unsigned)
 Location:  [facility] Room Number: [id number] Place of Service:   SNF    CODE STATUS: dnr   Allergies  Allergen Reactions   Lotensin [Benazepril]     Chief Complaint  Patient presents with   Medical Management of Chronic Issues                      Hypothyroidism, unspecified type:   Hypertension associated with stage 3b chronic kidney disease due to type 2 diabetes mellitus:  Diabetes mellitus type 2 with stage 3 chronic kidney disease and hypertension:  Post menopausal osteoporosis:    HPI:  She is a 76 year old long term resident of this facility being seen for the management of her chronic illnesses:   Hypothyroidism, unspecified type:   Hypertension associated with stage 3b chronic kidney disease due to type 2 diabetes mellitus:  Diabetes mellitus type 2 with stage 3 chronic kidney disease and hypertension:  Post menopausal osteoporosis. There are no reports of uncontrolled pain. Her weight is stable at 118 pounds; there are no reports of diarrheal stools.   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HCC)    Dementia (HCC)    Diabetes mellitus with CKD    H/O partial nephrectomy    Hypokalemia    Hypothyroidism    Impaired cognition    Multiple myeloma (HCC)    Dr Lesser, [facility]    Past Surgical History:  Procedure Laterality Date   ABDOMINAL HYSTERECTOMY     CRANIOTOMY     for lymphoma   YAG LASER APPLICATION Right 07/02/2015   Procedure: YAG LASER APPLICATION;  Surgeon: Carroll F Haines, MD;  Location: [facility];  Service: Ophthalmology;  Laterality: Right;    Social History   Socioeconomic History   Marital status: Single    Spouse name: Not on file   Number of children: Not on file   Years of education: Not on file   Highest education level: Not on file  Occupational History   Occupation: retired   Tobacco Use   Smoking status: Never   Smokeless tobacco: Never  Vaping  Use   Vaping Use: Never used  Substance and Sexual Activity   Alcohol use: No   Drug use: No   Sexual activity: Never  Other Topics Concern   Not on file  Social History Narrative   Long term resident of [facility]    Social Determinants of Health   Financial Resource Strain: Low Risk  (10/16/2020)   Overall Financial Resource Strain (CARDIA)    Difficulty of Paying Living Expenses: Not very hard  Food Insecurity: No Food Insecurity (10/16/2020)   Hunger Vital Sign    Worried About Running Out of Food in the Last Year: Never true    Ran Out of Food in the Last Year: Never true  Transportation Needs: No Transportation Needs (10/16/2020)   PRAPARE - Transportation    Lack of Transportation (Medical): No    Lack of Transportation (Non-Medical): No  Physical Activity: Inactive (10/16/2020)   Exercise Vital Sign    Days of Exercise per Week: 0 days    Minutes of Exercise per Session: 0 min  Stress: No Stress Concern Present (10/16/2020)   [employer] - Occupational Stress Questionnaire    Feeling of Stress : Not at all  Social Connections: Moderately Isolated (10/16/2020)   Social Connection   and Isolation Panel [NHANES]    Frequency of Communication with Friends and Family: More than three times a week    Frequency of Social Gatherings with Friends and Family: Once a week    Attends Religious Services: More than 4 times per year    Active Member of Clubs or Organizations: No    Attends Club or Organization Meetings: Never    Marital Status: Never married  Intimate Partner Violence: Not on file   Family History  Problem Relation Age of Onset   Depression Mother    Diabetes Mother    Heart disease Father    Cancer - Prostate Brother    Cancer Brother    Cancer Sister       VITAL SIGNS BP 131/82   Pulse 62   Temp (!) 97.4 F (36.3 C)   Ht 5' 9" (1.753 m)   Wt 118 lb 3.2 oz (53.6 kg)   BMI 17.46 kg/m    Outpatient Encounter Medications as of  10/09/2022  Medication Sig   acyclovir (ZOVIRAX) 400 MG tablet TAKE 1 TABLET BY MOUTH TWICE DAILY   alendronate (FOSAMAX) 70 MG tablet Take 70 mg by mouth once a week. Take with a full glass of water on an empty stomach.   aspirin EC 81 MG tablet Take 81 mg by mouth daily.   busPIRone (BUSPAR) 10 MG tablet Take 10 mg by mouth 3 (three) times daily. For anxiety   Calcium Carbonate-Vit D-Min (CALCIUM 600+D PLUS MINERALS) 600-400 MG-UNIT CHEW Chew 1 tablet by mouth daily.   Cholecalciferol (VITAMIN D) 50 MCG (2000 UT) CAPS Take by mouth daily.   cholestyramine (QUESTRAN) 4 g packet Take 4 g by mouth 2 (two) times daily between meals.   LACTOBACILLUS PROBIOTIC PO Take 2 capsules by mouth daily.   levothyroxine (SYNTHROID, LEVOTHROID) 25 MCG tablet Take 25 mcg by mouth daily before breakfast.   lipase/protease/amylase (CREON) 36000 UNITS CPEP capsule 36,000-114,000- 180,000 unit; amt: 1; oral Special Instructions: Take one capsule with snacks if eaten between meals. Twice A Day Between Meals as needed   lipase/protease/amylase (CREON) 36000 UNITS CPEP capsule Take 3 capsules by mouth 3 (three) times daily with meals.   loperamide (IMODIUM A-D) 2 MG tablet Take 2 mg by mouth 3 (three) times daily. For diarrhea   losartan (COZAAR) 50 MG tablet Take 1 tablet (50 mg total) by mouth daily.   NON FORMULARY Dysphagia 2 diet with thin liquids   NON FORMULARY Wanderguard #[id number] to ankle for safety awareness. Check placement and function qshift. Special Instructions: Check placement and function qshift. Every Shift Day, Evening, Night   potassium chloride SA (KLOR-CON) 20 MEQ tablet Take 20 mEq by mouth daily.    sertraline (ZOLOFT) 25 MG tablet Take 25 mg by mouth daily.     SIGNIFICANT DIAGNOSTIC EXAMS  PREVIOUS   08-10-20: t score: -3.758  03-16-22: chest x-ray:  No radiographic evidence of acute cardiopulmonary disease Mild cardiomegaly  Mild osteopenia Mild osteoarthritis   NO NEW EXAMS      LABS REVIEWED PREVIOUS    10-07-21: wbc 5.6; hgb 10.5; hct 32.5; mcv 100.3 plt 163; glucose 105; bun 25; creat 1.14; k+ 3.8; na++ 138; ca 8.8; GFR 50 liver normal albumin 3.2 10-10-21: chol 153; ldl 81; trig 56; hdl 61; tsh 2.577 11-11-21: wbc 4.6; hgb 10.9; hct 34.5; mcv 98.6 plt 160; glucose 183; bun 25; creat 1.23; k+ 3.8; na++ 143; ca 9.1; GFR 45; liver normal albumin 3.3; mag 2.3    12-23-21: wbc 4,1 hgb 11.9; hct 36.8; mcv 98.1 plt 106; glucose 117; bun 20; creat 1.17; k+ 3.4; na++ 142; ca 8.9; GFR 48; liver normal albumin  3.5  mag 2.2  01-06-22: hgb a1c 5.9 03-17-22: wbc 3.6; hgb 10.9; hct 34.7; mcv 97.2 plt 121; glucose 140; bun 20; creat 1.16; k+ 3.6; na++ 144; ca 9.2 GFR 49; protein 6.7; albumin 3.4 ast 69 03-17-22: wbc 4.1; hgb 10.9; hct 32.5; mcv 96.2 plt 98; glucose 96; bbun 22; creat 1.17; k+ 3.5; na++ 142; ca 9.0; GFR 48 d-dimer 1.67; CRP 1.2  03-24-22: d-dimer 0.72 05-13-22: wbc 5.2; hgb 10.7; hct 32.6; mcv 98.5 plt 148; glucose 85; bun 21; creat 1.31; k+ 3.4; na++ 141; ca 9.0; gfr 42; protein 6.5; albumin 3.3  05-15-22: hgb a1c 5.4 05-20-22: wbc 4.5; hgb 11.6; hct 35.6; mcv 97.8 plt 124 06-09-22: wbc 4.7; hgb 10.3; hct 32.0; mcv 97.6 plt 164; glucose 94; bun 23; creat 1.11; k+ 3.8; na++ 141; ca 9.1; gfr 51 protein 6.2 albumin 3.2 tah 3.485 06-25-22: wbc 4.2; hgb 10.7; hct 33.8; mcv 98.3 plt 110; glucose 97; bun 21; creat 1.10; k+ 3.6; na++ 140; ca 8.9; gfr 52; protein 6.4; albumin 3.3  TODAY  08-06-22: wbc 4.4; hgb 11.0; hct 34.5; mcv 99.4 plt 130; glucose 138; bun 24; creat 1.38; k+ 4.1; na++ 141; ca 8.9; gfr 39; protein 6.5; albumin 3.2; mag 2.3 09-09-22: wbc 5.1; hgb 11.3; hct 34.5; mcv 99.4 plt 132; glucose 119; bun 22; creat 1.29; k+ 4.1; na++ 143 ca 8.8; gfr 42; protein 6.6 albumin 3.2; mag 2.2   10-09-22: wbc 4.3; hgb 11.3; hct 35.0; mcv 101.7 plt 93; glucose 147; bun 21; creat 1.15; k+ 3.6; na++ 146; ca 9.0; gfr 49; protein 6.8; albumin 3.3    Review of Systems  Constitutional:   Negative for malaise/fatigue.  Respiratory:  Negative for cough and shortness of breath.   Cardiovascular:  Negative for chest pain, palpitations and leg swelling.  Gastrointestinal:  Negative for abdominal pain, constipation and heartburn.  Musculoskeletal:  Negative for back pain, joint pain and myalgias.  Skin: Negative.   Neurological:  Negative for dizziness.  Psychiatric/Behavioral:  The patient is not nervous/anxious.    Physical Exam Constitutional:      General: She is not in acute distress.    Appearance: She is underweight. She is not diaphoretic.  Neck:     Thyroid: No thyromegaly.  Cardiovascular:     Rate and Rhythm: Normal rate and regular rhythm.     Pulses: Normal pulses.     Heart sounds: Normal heart sounds.  Pulmonary:     Effort: Pulmonary effort is normal. No respiratory distress.     Breath sounds: Normal breath sounds.  Abdominal:     General: Bowel sounds are normal. There is no distension.     Palpations: Abdomen is soft.     Tenderness: There is no abdominal tenderness.  Musculoskeletal:        General: Normal range of motion.     Cervical back: Neck supple.     Right lower leg: No edema.     Left lower leg: No edema.  Lymphadenopathy:     Cervical: No cervical adenopathy.  Skin:    General: Skin is warm and dry.  Neurological:     Mental Status: She is alert. Mental status is at baseline.  Psychiatric:        Mood and Affect: Mood normal.        ASSESSMENT/  PLAN:  TODAY  Hypothyroidism, unspecified type: tsh 3.485 will continue synthroid 25 mcg daily   2. Hypertension associated with stage 3b chronic kidney disease due to type 2 diabetes mellitus: b/p 131/82 will continue cozaar 50 mg daily asa 81 mg daily   3. Diabetes mellitus type 2 with stage 3 chronic kidney disease and hypertension: hgb a1c 5.4 is on arb and statin   4. Post menopausal osteoporosis: t score -3.758 will continue fosamax 70 mg weekly and supplements   PREVIOUS     5. Thrombocytopenia: plt is 93  will monitor  6. Multiple myeloma without achieving remission: is followed by oncology   7. Aortic atherosclerosis (ct 08-25-14) is on statin and asa  8. First degree av block/bradycardia: has been seen by cardiology no further interventions;   9. Pancreatic insufficiency: will continue questran 4 gm twice daily creon 180,000 units with meals; and imodium 2 mg three times daily   10. Macrocytic anemia: hgb 11.3; vitamin B12: 620 is off iron due to diarrhea   11. Vascular dementia with behavioral disturbance weight is 118 pounds; will continue buspar 10 mg three times daily to help with mood state is off aricept  12. Herpes: no recent outbreak: will continue acyclovir 400 mg twice daily   13. Failure to thrive in adult: weight is 118 pounds will monitor   14. Hypokalemia: k+ 4.1 will continue k+ 20 meq daily      NP [facility]  call [phone]   

## 2022-10-16 ENCOUNTER — Inpatient Hospital Stay: Payer: Medicare PPO

## 2022-10-16 ENCOUNTER — Inpatient Hospital Stay: Payer: Medicare PPO | Attending: Hematology

## 2022-10-16 VITALS — BP 157/84 | HR 74 | Temp 97.3°F | Resp 18 | Wt 120.2 lb

## 2022-10-16 DIAGNOSIS — C9000 Multiple myeloma not having achieved remission: Secondary | ICD-10-CM | POA: Diagnosis not present

## 2022-10-16 DIAGNOSIS — Z5112 Encounter for antineoplastic immunotherapy: Secondary | ICD-10-CM | POA: Diagnosis not present

## 2022-10-16 LAB — CBC WITH DIFFERENTIAL/PLATELET
Abs Immature Granulocytes: 0.02 10*3/uL (ref 0.00–0.07)
Basophils Absolute: 0 10*3/uL (ref 0.0–0.1)
Basophils Relative: 1 %
Eosinophils Absolute: 0.4 10*3/uL (ref 0.0–0.5)
Eosinophils Relative: 7 %
HCT: 34.9 % — ABNORMAL LOW (ref 36.0–46.0)
Hemoglobin: 11.4 g/dL — ABNORMAL LOW (ref 12.0–15.0)
Immature Granulocytes: 0 %
Lymphocytes Relative: 11 %
Lymphs Abs: 0.6 10*3/uL — ABNORMAL LOW (ref 0.7–4.0)
MCH: 32.9 pg (ref 26.0–34.0)
MCHC: 32.7 g/dL (ref 30.0–36.0)
MCV: 100.9 fL — ABNORMAL HIGH (ref 80.0–100.0)
Monocytes Absolute: 0.6 10*3/uL (ref 0.1–1.0)
Monocytes Relative: 11 %
Neutro Abs: 3.6 10*3/uL (ref 1.7–7.7)
Neutrophils Relative %: 70 %
Platelets: 106 10*3/uL — ABNORMAL LOW (ref 150–400)
RBC: 3.46 MIL/uL — ABNORMAL LOW (ref 3.87–5.11)
RDW: 16.2 % — ABNORMAL HIGH (ref 11.5–15.5)
WBC: 5.2 10*3/uL (ref 4.0–10.5)
nRBC: 0 % (ref 0.0–0.2)

## 2022-10-16 LAB — COMPREHENSIVE METABOLIC PANEL
ALT: 21 U/L (ref 0–44)
AST: 28 U/L (ref 15–41)
Albumin: 3.2 g/dL — ABNORMAL LOW (ref 3.5–5.0)
Alkaline Phosphatase: 71 U/L (ref 38–126)
Anion gap: 9 (ref 5–15)
BUN: 21 mg/dL (ref 8–23)
CO2: 26 mmol/L (ref 22–32)
Calcium: 9.1 mg/dL (ref 8.9–10.3)
Chloride: 108 mmol/L (ref 98–111)
Creatinine, Ser: 1.17 mg/dL — ABNORMAL HIGH (ref 0.44–1.00)
GFR, Estimated: 48 mL/min — ABNORMAL LOW (ref >60)
Glucose, Bld: 117 mg/dL — ABNORMAL HIGH (ref 70–99)
Potassium: 4.2 mmol/L (ref 3.5–5.1)
Sodium: 143 mmol/L (ref 135–145)
Total Bilirubin: 0.4 mg/dL (ref 0.3–1.2)
Total Protein: 6.3 g/dL — ABNORMAL LOW (ref 6.5–8.1)

## 2022-10-16 LAB — MAGNESIUM: Magnesium: 2.4 mg/dL (ref 1.7–2.4)

## 2022-10-16 MED ORDER — DEXAMETHASONE 4 MG PO TABS
10.0000 mg | ORAL_TABLET | Freq: Once | ORAL | Status: AC
  Administered 2022-10-16: 10 mg via ORAL
  Filled 2022-10-16: qty 3

## 2022-10-16 MED ORDER — BORTEZOMIB CHEMO SQ INJECTION 3.5 MG (2.5MG/ML)
1.3000 mg/m2 | Freq: Once | INTRAMUSCULAR | Status: AC
  Administered 2022-10-16: 2 mg via SUBCUTANEOUS
  Filled 2022-10-16: qty 0.8

## 2022-10-16 NOTE — Progress Notes (Signed)
Patient tolerated Velcade injection with no complaints voiced. Lab work reviewed. See MAR for details. Injection site clean and dry with no bruising or swelling noted. Patient stable during and after injection. Band aid applied. VSS. Patient left in satisfactory condition with no s/s of distress noted.

## 2022-10-16 NOTE — Patient Instructions (Signed)
[facility]  Discharge Instructions: Thank you for choosing [facility] to provide your oncology and hematology care.  If you have a lab appointment with the [facility], please come in thru the Main Entrance and check in at the main information desk.  Wear comfortable clothing and clothing appropriate for easy access to any Portacath or PICC line.   We strive to give you quality time with your provider. You may need to reschedule your appointment if you arrive late (15 or more minutes).  Arriving late affects you and other patients whose appointments are after yours.  Also, if you miss three or more appointments without notifying the office, you may be dismissed from the clinic at the provider's discretion.      For prescription refill requests, have your pharmacy contact our office and allow 72 hours for refills to be completed.    Today you received the following chemotherapy and/or immunotherapy agents Velcade, return as scheduled.   To help prevent nausea and vomiting after your treatment, we encourage you to take your nausea medication as directed.  BELOW ARE SYMPTOMS THAT SHOULD BE REPORTED IMMEDIATELY: *FEVER GREATER THAN 100.4 F (38 C) OR HIGHER *CHILLS OR SWEATING *NAUSEA AND VOMITING THAT IS NOT CONTROLLED WITH YOUR NAUSEA MEDICATION *UNUSUAL SHORTNESS OF BREATH *UNUSUAL BRUISING OR BLEEDING *URINARY PROBLEMS (pain or burning when urinating, or frequent urination) *BOWEL PROBLEMS (unusual diarrhea, constipation, pain near the anus) TENDERNESS IN MOUTH AND THROAT WITH OR WITHOUT PRESENCE OF ULCERS (sore throat, sores in mouth, or a toothache) UNUSUAL RASH, SWELLING OR PAIN  UNUSUAL VAGINAL DISCHARGE OR ITCHING   Items with * indicate a potential emergency and should be followed up as soon as possible or go to the Emergency Department if any problems should occur.  Please show the CHEMOTHERAPY ALERT CARD or IMMUNOTHERAPY ALERT CARD at  check-in to the Emergency Department and triage nurse.  Should you have questions after your visit or need to cancel or reschedule your appointment, please contact [facility] [phone]  and follow the prompts.  Office hours are 8:00 a.m. to 4:30 p.m. Monday - Friday. Please note that voicemails left after 4:00 p.m. may not be returned until the following business day.  We are closed weekends and major holidays. You have access to a nurse at all times for urgent questions. Please call the main number to the clinic [phone] and follow the prompts.  For any non-urgent questions, you may also contact your provider using MyChart. We now offer e-Visits for anyone 51 and older to request care online for non-urgent symptoms. For details visit [email or web address].   Also download the MyChart app! Go to the app store, search "MyChart", open the app, select [facility], and log in with your MyChart username and password.  Masks are optional in the cancer centers. If you would like for your care team to wear a mask while they are taking care of you, please let them know. You may have one support person who is at least 78 years old accompany you for your appointments.

## 2022-10-21 ENCOUNTER — Other Ambulatory Visit: Payer: Self-pay

## 2022-10-23 ENCOUNTER — Ambulatory Visit: Payer: Medicare PPO

## 2022-10-23 ENCOUNTER — Other Ambulatory Visit: Payer: Medicare PPO

## 2022-10-30 ENCOUNTER — Inpatient Hospital Stay: Payer: Medicare PPO

## 2022-10-30 ENCOUNTER — Other Ambulatory Visit: Payer: Medicare PPO

## 2022-10-30 ENCOUNTER — Ambulatory Visit: Payer: Medicare PPO

## 2022-10-30 VITALS — BP 146/86 | HR 79 | Temp 98.1°F | Resp 18 | Wt 122.4 lb

## 2022-10-30 DIAGNOSIS — C9000 Multiple myeloma not having achieved remission: Secondary | ICD-10-CM

## 2022-10-30 DIAGNOSIS — Z5112 Encounter for antineoplastic immunotherapy: Secondary | ICD-10-CM | POA: Diagnosis not present

## 2022-10-30 LAB — CBC WITH DIFFERENTIAL/PLATELET
Abs Immature Granulocytes: 0.01 10*3/uL (ref 0.00–0.07)
Basophils Absolute: 0 10*3/uL (ref 0.0–0.1)
Basophils Relative: 1 %
Eosinophils Absolute: 0.2 10*3/uL (ref 0.0–0.5)
Eosinophils Relative: 6 %
HCT: 32.8 % — ABNORMAL LOW (ref 36.0–46.0)
Hemoglobin: 10.8 g/dL — ABNORMAL LOW (ref 12.0–15.0)
Immature Granulocytes: 0 %
Lymphocytes Relative: 12 %
Lymphs Abs: 0.5 10*3/uL — ABNORMAL LOW (ref 0.7–4.0)
MCH: 33.4 pg (ref 26.0–34.0)
MCHC: 32.9 g/dL (ref 30.0–36.0)
MCV: 101.5 fL — ABNORMAL HIGH (ref 80.0–100.0)
Monocytes Absolute: 0.3 10*3/uL (ref 0.1–1.0)
Monocytes Relative: 8 %
Neutro Abs: 3.1 10*3/uL (ref 1.7–7.7)
Neutrophils Relative %: 73 %
Platelets: 143 10*3/uL — ABNORMAL LOW (ref 150–400)
RBC: 3.23 MIL/uL — ABNORMAL LOW (ref 3.87–5.11)
RDW: 15.1 % (ref 11.5–15.5)
WBC: 4.3 10*3/uL (ref 4.0–10.5)
nRBC: 0 % (ref 0.0–0.2)

## 2022-10-30 LAB — COMPREHENSIVE METABOLIC PANEL
ALT: 24 U/L (ref 0–44)
AST: 34 U/L (ref 15–41)
Albumin: 3.1 g/dL — ABNORMAL LOW (ref 3.5–5.0)
Alkaline Phosphatase: 81 U/L (ref 38–126)
Anion gap: 7 (ref 5–15)
BUN: 20 mg/dL (ref 8–23)
CO2: 26 mmol/L (ref 22–32)
Calcium: 8.8 mg/dL — ABNORMAL LOW (ref 8.9–10.3)
Chloride: 110 mmol/L (ref 98–111)
Creatinine, Ser: 1.29 mg/dL — ABNORMAL HIGH (ref 0.44–1.00)
GFR, Estimated: 42 mL/min — ABNORMAL LOW (ref >60)
Glucose, Bld: 133 mg/dL — ABNORMAL HIGH (ref 70–99)
Potassium: 4 mmol/L (ref 3.5–5.1)
Sodium: 143 mmol/L (ref 135–145)
Total Bilirubin: 0.4 mg/dL (ref 0.3–1.2)
Total Protein: 6.3 g/dL — ABNORMAL LOW (ref 6.5–8.1)

## 2022-10-30 MED ORDER — DEXAMETHASONE 4 MG PO TABS
10.0000 mg | ORAL_TABLET | Freq: Once | ORAL | Status: AC
  Administered 2022-10-30: 10 mg via ORAL
  Filled 2022-10-30: qty 3

## 2022-10-30 MED ORDER — LANREOTIDE ACETATE 120 MG/0.5ML ~~LOC~~ SOLN
SUBCUTANEOUS | Status: AC
  Filled 2022-10-30: qty 120

## 2022-10-30 MED ORDER — BORTEZOMIB CHEMO SQ INJECTION 3.5 MG (2.5MG/ML)
1.3000 mg/m2 | Freq: Once | INTRAMUSCULAR | Status: AC
  Administered 2022-10-30: 2 mg via SUBCUTANEOUS
  Filled 2022-10-30: qty 0.8

## 2022-10-30 NOTE — Progress Notes (Signed)
Patient tolerated Velcade injection with no complaints voiced. Lab work reviewed. See MAR for details. Injection site clean and dry with no bruising or swelling noted. Patient stable during and after injection. Band aid applied. VSS. Patient left in satisfactory condition with no s/s of distress noted.

## 2022-10-30 NOTE — Patient Instructions (Signed)
[facility]  Discharge Instructions: Thank you for choosing [facility] to provide your oncology and hematology care.  If you have a lab appointment with the [facility], please come in thru the Main Entrance and check in at the main information desk.  Wear comfortable clothing and clothing appropriate for easy access to any Portacath or PICC line.   We strive to give you quality time with your provider. You may need to reschedule your appointment if you arrive late (15 or more minutes).  Arriving late affects you and other patients whose appointments are after yours.  Also, if you miss three or more appointments without notifying the office, you may be dismissed from the clinic at the provider's discretion.      For prescription refill requests, have your pharmacy contact our office and allow 72 hours for refills to be completed.    Today you received the following chemotherapy and/or immunotherapy agents Velcade, return as scheduled.   To help prevent nausea and vomiting after your treatment, we encourage you to take your nausea medication as directed.  BELOW ARE SYMPTOMS THAT SHOULD BE REPORTED IMMEDIATELY: *FEVER GREATER THAN 100.4 F (38 C) OR HIGHER *CHILLS OR SWEATING *NAUSEA AND VOMITING THAT IS NOT CONTROLLED WITH YOUR NAUSEA MEDICATION *UNUSUAL SHORTNESS OF BREATH *UNUSUAL BRUISING OR BLEEDING *URINARY PROBLEMS (pain or burning when urinating, or frequent urination) *BOWEL PROBLEMS (unusual diarrhea, constipation, pain near the anus) TENDERNESS IN MOUTH AND THROAT WITH OR WITHOUT PRESENCE OF ULCERS (sore throat, sores in mouth, or a toothache) UNUSUAL RASH, SWELLING OR PAIN  UNUSUAL VAGINAL DISCHARGE OR ITCHING   Items with * indicate a potential emergency and should be followed up as soon as possible or go to the Emergency Department if any problems should occur.  Please show the CHEMOTHERAPY ALERT CARD or IMMUNOTHERAPY ALERT CARD at  check-in to the Emergency Department and triage nurse.  Should you have questions after your visit or need to cancel or reschedule your appointment, please contact [facility] [phone]  and follow the prompts.  Office hours are 8:00 a.m. to 4:30 p.m. Monday - Friday. Please note that voicemails left after 4:00 p.m. may not be returned until the following business day.  We are closed weekends and major holidays. You have access to a nurse at all times for urgent questions. Please call the main number to the clinic [phone] and follow the prompts.  For any non-urgent questions, you may also contact your provider using MyChart. We now offer e-Visits for anyone 38 and older to request care online for non-urgent symptoms. For details visit [email or web address].   Also download the MyChart app! Go to the app store, search "MyChart", open the app, select [facility], and log in with your MyChart username and password.  Masks are optional in the cancer centers. If you would like for your care team to wear a mask while they are taking care of you, please let them know. You may have one support person who is at least 78 years old accompany you for your appointments.

## 2022-11-04 ENCOUNTER — Other Ambulatory Visit: Payer: Self-pay

## 2022-11-04 ENCOUNTER — Non-Acute Institutional Stay (SKILLED_NURSING_FACILITY): Payer: Medicare PPO | Admitting: Internal Medicine

## 2022-11-04 DIAGNOSIS — D539 Nutritional anemia, unspecified: Secondary | ICD-10-CM

## 2022-11-04 DIAGNOSIS — N183 Chronic kidney disease, stage 3 unspecified: Secondary | ICD-10-CM

## 2022-11-04 DIAGNOSIS — E1122 Type 2 diabetes mellitus with diabetic chronic kidney disease: Secondary | ICD-10-CM

## 2022-11-04 DIAGNOSIS — I129 Hypertensive chronic kidney disease with stage 1 through stage 4 chronic kidney disease, or unspecified chronic kidney disease: Secondary | ICD-10-CM | POA: Diagnosis not present

## 2022-11-04 DIAGNOSIS — F01518 Vascular dementia, unspecified severity, with other behavioral disturbance: Secondary | ICD-10-CM

## 2022-11-04 DIAGNOSIS — C9000 Multiple myeloma not having achieved remission: Secondary | ICD-10-CM

## 2022-11-04 NOTE — Progress Notes (Unsigned)
NURSING HOME LOCATION:  [facility] ROOM NUMBER:  [id number]  CODE STATUS:  DNR  PCP:  Synthia Innocent NP  This is a nursing facility follow up visit of chronic medical diagnoses & to document compliance with Regulation 483.30 (c) in The Long Term Care Survey Manual Phase 2 which mandates caregiver visit ( visits can alternate among physician, PA or NP as per statutes) within 10 days of 30 days / 60 days/ 90 days post admission to SNF date    Interim medical record and care since last SNF visit was updated with review of diagnostic studies and change in clinical status since last visit were documented.  HPI: She is a permanent resident of this facility with medical diagnoses of essential hypertension, aortic atherosclerosis, history of CNS lymphoma, diabetes with CKD, hypothyroidism, multiple myeloma, and dementia. Surgeries & procedures include partial nephrectomy, TAH, and craniotomy.  Current labs reveal glucose range of 117 up to 240.  A1c was last checked 05/15/2022 and revealed a value of 5.4%.  CKD high stage IIIb is present with a creatinine of 1.29 and GFR of 42.  Serially this is essentially stable . Chronic anemia is also essentially stable with current H/H of 10.8/32.8.  Macrocytosis persists with a current MCV of 101.5.  Thrombocytopenia has varied from 93,000 to current value of 143,000.Marland Kitchen She is currently on Velcade for her multiple myeloma which is not in remission.  Review of systems: Dementia invalidated responses.  She states that she is "fine."  She denies any active symptoms but does state that she does not produce urine.  Constitutional: No fever, significant weight change, fatigue  Eyes: No redness, discharge, pain, vision change ENT/mouth: No nasal congestion,  purulent discharge, earache, change in hearing, sore throat  Cardiovascular: No chest pain, palpitations, paroxysmal nocturnal dyspnea, claudication, edema  Respiratory: No cough, sputum production,  hemoptysis, DOE, significant snoring, apnea   Gastrointestinal: No heartburn, dysphagia, abdominal pain, nausea /vomiting, rectal bleeding, melena, change in bowels Genitourinary: see above Musculoskeletal: No joint stiffness, joint swelling, weakness, pain Dermatologic: No rash, pruritus, change in appearance of skin Neurologic: No dizziness, headache, syncope, seizures, numbness, tingling Psychiatric: No significant anxiety, depression, insomnia, anorexia Endocrine: No change in hair/skin/nails, excessive thirst, excessive hunger, excessive urination  Hematologic/lymphatic: No significant bruising, lymphadenopathy, abnormal bleeding Allergy/immunology: No itchy/watery eyes, significant sneezing, urticaria, angioedema  Physical exam:  Pertinent or positive findings: She is thin and appears suboptimally nourished.  She has an upper plate.  She is missing multiple mandibular teeth.  Breath sounds are decreased.  Second heart sound is increased.  Dorsalis pedis pulses are stronger than the posterior tibial pulses. Limb atrophy present.  General appearance: no acute distress, increased work of breathing is present.   Lymphatic: No lymphadenopathy about the head, neck, axilla. Eyes: No conjunctival inflammation or lid edema is present. There is no scleral icterus. Ears:  External ear exam shows no significant lesions or deformities.   Nose:  External nasal examination shows no deformity or inflammation. Nasal mucosa are pink and moist without lesions, exudates Neck:  No thyromegaly, masses, tenderness noted.    Heart:  Normal rate and regular rhythm. S1 normal without gallop, murmur, click, rub .  Lungs: without wheezes, rhonchi, rales, rubs. Abdomen: Bowel sounds are normal. Abdomen is soft and nontender with no organomegaly, hernias, masses. GU: Deferred  Extremities:  No cyanosis, clubbing, edema  Neurologic exam :Balance, Rhomberg, finger to nose testing could not be completed due to  clinical state Skin:  Warm & dry w/o tenting. No significant lesions or rash.  See summary under each active problem in the Problem List with associated updated therapeutic plan

## 2022-11-05 ENCOUNTER — Inpatient Hospital Stay: Payer: Medicare PPO

## 2022-11-05 ENCOUNTER — Encounter: Payer: Self-pay | Admitting: Internal Medicine

## 2022-11-05 ENCOUNTER — Ambulatory Visit: Payer: Medicare PPO

## 2022-11-05 ENCOUNTER — Other Ambulatory Visit: Payer: Medicare PPO

## 2022-11-05 VITALS — BP 133/92 | HR 79 | Temp 98.1°F | Resp 16

## 2022-11-05 DIAGNOSIS — C9000 Multiple myeloma not having achieved remission: Secondary | ICD-10-CM

## 2022-11-05 DIAGNOSIS — Z5112 Encounter for antineoplastic immunotherapy: Secondary | ICD-10-CM | POA: Diagnosis not present

## 2022-11-05 LAB — COMPREHENSIVE METABOLIC PANEL
ALT: 32 U/L (ref 0–44)
AST: 42 U/L — ABNORMAL HIGH (ref 15–41)
Albumin: 3.2 g/dL — ABNORMAL LOW (ref 3.5–5.0)
Alkaline Phosphatase: 85 U/L (ref 38–126)
Anion gap: 6 (ref 5–15)
BUN: 22 mg/dL (ref 8–23)
CO2: 26 mmol/L (ref 22–32)
Calcium: 8.5 mg/dL — ABNORMAL LOW (ref 8.9–10.3)
Chloride: 109 mmol/L (ref 98–111)
Creatinine, Ser: 1.19 mg/dL — ABNORMAL HIGH (ref 0.44–1.00)
GFR, Estimated: 47 mL/min — ABNORMAL LOW (ref >60)
Glucose, Bld: 240 mg/dL — ABNORMAL HIGH (ref 70–99)
Potassium: 3.8 mmol/L (ref 3.5–5.1)
Sodium: 141 mmol/L (ref 135–145)
Total Bilirubin: 0.8 mg/dL (ref 0.3–1.2)
Total Protein: 6.7 g/dL (ref 6.5–8.1)

## 2022-11-05 LAB — CBC WITH DIFFERENTIAL/PLATELET
Abs Immature Granulocytes: 0 10*3/uL (ref 0.00–0.07)
Basophils Absolute: 0 10*3/uL (ref 0.0–0.1)
Basophils Relative: 1 %
Eosinophils Absolute: 0.2 10*3/uL (ref 0.0–0.5)
Eosinophils Relative: 5 %
HCT: 35 % — ABNORMAL LOW (ref 36.0–46.0)
Hemoglobin: 11.3 g/dL — ABNORMAL LOW (ref 12.0–15.0)
Immature Granulocytes: 0 %
Lymphocytes Relative: 14 %
Lymphs Abs: 0.6 10*3/uL — ABNORMAL LOW (ref 0.7–4.0)
MCH: 33 pg (ref 26.0–34.0)
MCHC: 32.3 g/dL (ref 30.0–36.0)
MCV: 102.3 fL — ABNORMAL HIGH (ref 80.0–100.0)
Monocytes Absolute: 0.4 10*3/uL (ref 0.1–1.0)
Monocytes Relative: 10 %
Neutro Abs: 2.8 10*3/uL (ref 1.7–7.7)
Neutrophils Relative %: 70 %
Platelets: 100 10*3/uL — ABNORMAL LOW (ref 150–400)
RBC: 3.42 MIL/uL — ABNORMAL LOW (ref 3.87–5.11)
RDW: 15.5 % (ref 11.5–15.5)
WBC: 3.9 10*3/uL — ABNORMAL LOW (ref 4.0–10.5)
nRBC: 0 % (ref 0.0–0.2)

## 2022-11-05 LAB — MAGNESIUM: Magnesium: 2.3 mg/dL (ref 1.7–2.4)

## 2022-11-05 MED ORDER — DEXAMETHASONE 4 MG PO TABS
10.0000 mg | ORAL_TABLET | Freq: Once | ORAL | Status: AC
  Administered 2022-11-05: 10 mg via ORAL

## 2022-11-05 MED ORDER — BORTEZOMIB CHEMO SQ INJECTION 3.5 MG (2.5MG/ML)
1.3000 mg/m2 | Freq: Once | INTRAMUSCULAR | Status: AC
  Administered 2022-11-05: 2 mg via SUBCUTANEOUS
  Filled 2022-11-05: qty 0.8

## 2022-11-05 NOTE — Patient Instructions (Signed)
[facility]  Discharge Instructions: Thank you for choosing [facility] to provide your oncology and hematology care.  If you have a lab appointment with the Cancer Center, please come in thru the Main Entrance and check in at the main information desk.  Wear comfortable clothing and clothing appropriate for easy access to any Portacath or PICC line.   We strive to give you quality time with your provider. You may need to reschedule your appointment if you arrive late (15 or more minutes).  Arriving late affects you and other patients whose appointments are after yours.  Also, if you miss three or more appointments without notifying the office, you may be dismissed from the clinic at the provider's discretion.      For prescription refill requests, have your pharmacy contact our office and allow 72 hours for refills to be completed.    Today you received the following chemotherapy and/or immunotherapy agents Velcade.  Bortezomib Injection What is this medication? BORTEZOMIB (bor TEZ oh mib) treats lymphoma. It may also be used to treat multiple myeloma, a type of bone marrow cancer. It works by blocking a protein that causes cancer cells to grow and multiply. This helps to slow or stop the spread of cancer cells. This medicine may be used for other purposes; ask your health care provider or pharmacist if you have questions. COMMON BRAND NAME(S): Velcade What should I tell my care team before I take this medication? They need to know if you have any of these conditions: Dehydration Diabetes Heart disease Liver disease Tingling of the fingers or toes or other nerve disorder An unusual or allergic reaction to bortezomib, other medications, foods, dyes, or preservatives If you or your partner are pregnant or trying to get pregnant Breastfeeding How should I use this medication? This medication is injected into a vein or under the skin. It is given by your  care team in a hospital or clinic setting. Talk to your care team about the use of this medication in children. Special care may be needed. Overdosage: If you think you have taken too much of this medicine contact a poison control center or emergency room at once. NOTE: This medicine is only for you. Do not share this medicine with others. What if I miss a dose? Keep appointments for follow-up doses. It is important not to miss your dose. Call your care team if you are unable to keep an appointment. What may interact with this medication? Ketoconazole Rifampin This list may not describe all possible interactions. Give your health care provider a list of all the medicines, herbs, non-prescription drugs, or dietary supplements you use. Also tell them if you smoke, drink alcohol, or use illegal drugs. Some items may interact with your medicine. What should I watch for while using this medication? Your condition will be monitored carefully while you are receiving this medication. You may need blood work while taking this medication. This medication may affect your coordination, reaction time, or judgment. Do not drive or operate machinery until you know how this medication affects you. Sit up or stand slowly to reduce the risk of dizzy or fainting spells. Drinking alcohol with this medication can increase the risk of these side effects. This medication may increase your risk of getting an infection. Call your care team for advice if you get a fever, chills, sore throat, or other symptoms of a cold or flu. Do not treat yourself. Try to avoid being around   people who are sick. Check with your care team if you have severe diarrhea, nausea, and vomiting, or if you sweat a lot. The loss of too much body fluid may make it dangerous for you to take this medication. Talk to your care team if you may be pregnant. Serious birth defects can occur if you take this medication during pregnancy and for 7 months after the  last dose. You will need a negative pregnancy test before starting this medication. Contraception is recommended while taking this medication and for 7 months after the last dose. Your care team can help you find the option that works for you. If your partner can get pregnant, use a condom during sex while taking this medication and for 4 months after the last dose. Do not breastfeed while taking this medication and for 2 months after the last dose. This medication may cause infertility. Talk to your care team if you are concerned about your fertility. What side effects may I notice from receiving this medication? Side effects that you should report to your care team as soon as possible: Allergic reactions--skin rash, itching, hives, swelling of the face, lips, tongue, or throat Bleeding--bloody or black, tar-like stools, vomiting blood or brown material that looks like coffee grounds, red or dark brown urine, small red or purple spots on skin, unusual bruising or bleeding Bleeding in the brain--severe headache, stiff neck, confusion, dizziness, change in vision, numbness or weakness of the face, arm, or leg, trouble speaking, trouble walking, vomiting Bowel blockage--stomach cramping, unable to have a bowel movement or pass gas, loss of appetite, vomiting Heart failure--shortness of breath, swelling of the ankles, feet, or hands, sudden weight gain, unusual weakness or fatigue Infection--fever, chills, cough, sore throat, wounds that don't heal, pain or trouble when passing urine, general feeling of discomfort or being unwell Liver injury--right upper belly pain, loss of appetite, nausea, light-colored stool, dark yellow or brown urine, yellowing skin or eyes, unusual weakness or fatigue Low blood pressure--dizziness, feeling faint or lightheaded, blurry vision Lung injury--shortness of breath or trouble breathing, cough, spitting up blood, chest pain, fever Pain, tingling, or numbness in the hands  or feet Severe or prolonged diarrhea Stomach pain, bloody diarrhea, pale skin, unusual weakness or fatigue, decrease in the amount of urine, which may be signs of hemolytic uremic syndrome Sudden and severe headache, confusion, change in vision, seizures, which may be signs of posterior reversible encephalopathy syndrome (PRES) TTP--purple spots on the skin or inside the mouth, pale skin, yellowing skin or eyes, unusual weakness or fatigue, fever, fast or irregular heartbeat, confusion, change in vision, trouble speaking, trouble walking Tumor lysis syndrome (TLS)--nausea, vomiting, diarrhea, decrease in the amount of urine, dark urine, unusual weakness or fatigue, confusion, muscle pain or cramps, fast or irregular heartbeat, joint pain Side effects that usually do not require medical attention (report to your care team if they continue or are bothersome): Constipation Diarrhea Fatigue Loss of appetite Nausea This list may not describe all possible side effects. Call your doctor for medical advice about side effects. You may report side effects to FDA at 1-800-FDA-1088. Where should I keep my medication? This medication is given in a hospital or clinic. It will not be stored at home. NOTE: This sheet is a summary. It may not cover all possible information. If you have questions about this medicine, talk to your doctor, pharmacist, or health care provider.  2023 Elsevier/Gold Standard (2022-04-30 00:00:00)        To help   prevent nausea and vomiting after your treatment, we encourage you to take your nausea medication as directed.  BELOW ARE SYMPTOMS THAT SHOULD BE REPORTED IMMEDIATELY: *FEVER GREATER THAN 100.4 F (38 C) OR HIGHER *CHILLS OR SWEATING *NAUSEA AND VOMITING THAT IS NOT CONTROLLED WITH YOUR NAUSEA MEDICATION *UNUSUAL SHORTNESS OF BREATH *UNUSUAL BRUISING OR BLEEDING *URINARY PROBLEMS (pain or burning when urinating, or frequent urination) *BOWEL PROBLEMS (unusual diarrhea,  constipation, pain near the anus) TENDERNESS IN MOUTH AND THROAT WITH OR WITHOUT PRESENCE OF ULCERS (sore throat, sores in mouth, or a toothache) UNUSUAL RASH, SWELLING OR PAIN  UNUSUAL VAGINAL DISCHARGE OR ITCHING   Items with * indicate a potential emergency and should be followed up as soon as possible or go to the Emergency Department if any problems should occur.  Please show the CHEMOTHERAPY ALERT CARD or IMMUNOTHERAPY ALERT CARD at check-in to the Emergency Department and triage nurse.  Should you have questions after your visit or need to cancel or reschedule your appointment, please contact [facility] [phone]  and follow the prompts.  Office hours are 8:00 a.m. to 4:30 p.m. Monday - Friday. Please note that voicemails left after 4:00 p.m. may not be returned until the following business day.  We are closed weekends and major holidays. You have access to a nurse at all times for urgent questions. Please call the main number to the clinic [phone] and follow the prompts.  For any non-urgent questions, you may also contact your provider using MyChart. We now offer e-Visits for anyone 18 and older to request care online for non-urgent symptoms. For details visit [email or web address].   Also download the MyChart app! Go to the app store, search "MyChart", open the app, select [facility], and log in with your MyChart username and password.  Masks are optional in the cancer centers. If you would like for your care team to wear a mask while they are taking care of you, please let them know. You may have one support person who is at least 78 years old accompany you for your appointments.  

## 2022-11-05 NOTE — Assessment & Plan Note (Signed)
Despite multiple advanced comorbidities, she denies any active symptoms.  Staff states that dementia is severe.  There are no behavioral issues other than wandering in her wheelchair throughout the facility.  She is easily redirected.  No change in psychotropic medicines indicated.

## 2022-11-05 NOTE — Patient Instructions (Signed)
See assessment and plan under each diagnosis in the problem list and acutely for this visit 

## 2022-11-05 NOTE — Assessment & Plan Note (Signed)
Epic glucose range is 117 up to 240.  Last A1c was 05/15/2022 and revealed a value of 5.4%.  This will need to be updated.

## 2022-11-05 NOTE — Assessment & Plan Note (Signed)
CKD is now low stage IIIa with creatinine 1.19 and GFR 47.  Serially this is stable to improved.  Med list reviewed; no change indicated unless there is progression of CKD.

## 2022-11-05 NOTE — Assessment & Plan Note (Signed)
DME serially is stable to improved with current H/H of 11.3/35.  Macrocytosis persist with the current MCV of 102.3. Last B12 level was 393 in April 2022.  Update deferred to hematology.

## 2022-11-05 NOTE — Progress Notes (Signed)
Patient presents today for Velcade injection. MAR reviewed. Vitals signs within parameters for treatment. Labs within parameters for treatment. Patient has no complaints of any changes or side effects related to her last treatment.   Treatment given today per MD orders. Tolerated infusion without adverse affects. Vital signs stable. No complaints at this time. Discharged from clinic by wheel chair accompanied by CNA in stable condition. Alert and oriented x 3. F/U with [facility] as scheduled.

## 2022-11-12 ENCOUNTER — Ambulatory Visit: Payer: Medicare PPO

## 2022-11-12 ENCOUNTER — Other Ambulatory Visit: Payer: Medicare PPO

## 2022-11-12 ENCOUNTER — Other Ambulatory Visit: Payer: Self-pay

## 2022-11-12 ENCOUNTER — Inpatient Hospital Stay: Payer: Medicare PPO

## 2022-11-12 VITALS — BP 160/93 | HR 94 | Temp 98.1°F | Resp 18 | Wt 121.2 lb

## 2022-11-12 DIAGNOSIS — Z5112 Encounter for antineoplastic immunotherapy: Secondary | ICD-10-CM | POA: Diagnosis not present

## 2022-11-12 DIAGNOSIS — C9000 Multiple myeloma not having achieved remission: Secondary | ICD-10-CM

## 2022-11-12 LAB — CBC WITH DIFFERENTIAL/PLATELET
Abs Immature Granulocytes: 0.01 10*3/uL (ref 0.00–0.07)
Basophils Absolute: 0 10*3/uL (ref 0.0–0.1)
Basophils Relative: 0 %
Eosinophils Absolute: 0.2 10*3/uL (ref 0.0–0.5)
Eosinophils Relative: 5 %
HCT: 36.4 % (ref 36.0–46.0)
Hemoglobin: 11.7 g/dL — ABNORMAL LOW (ref 12.0–15.0)
Immature Granulocytes: 0 %
Lymphocytes Relative: 13 %
Lymphs Abs: 0.6 10*3/uL — ABNORMAL LOW (ref 0.7–4.0)
MCH: 33.3 pg (ref 26.0–34.0)
MCHC: 32.1 g/dL (ref 30.0–36.0)
MCV: 103.7 fL — ABNORMAL HIGH (ref 80.0–100.0)
Monocytes Absolute: 0.5 10*3/uL (ref 0.1–1.0)
Monocytes Relative: 10 %
Neutro Abs: 3.4 10*3/uL (ref 1.7–7.7)
Neutrophils Relative %: 72 %
Platelets: 109 10*3/uL — ABNORMAL LOW (ref 150–400)
RBC: 3.51 MIL/uL — ABNORMAL LOW (ref 3.87–5.11)
RDW: 15.4 % (ref 11.5–15.5)
WBC: 4.8 10*3/uL (ref 4.0–10.5)
nRBC: 0 % (ref 0.0–0.2)

## 2022-11-12 LAB — COMPREHENSIVE METABOLIC PANEL
ALT: 21 U/L (ref 0–44)
AST: 30 U/L (ref 15–41)
Albumin: 3.4 g/dL — ABNORMAL LOW (ref 3.5–5.0)
Alkaline Phosphatase: 76 U/L (ref 38–126)
Anion gap: 9 (ref 5–15)
BUN: 21 mg/dL (ref 8–23)
CO2: 25 mmol/L (ref 22–32)
Calcium: 9 mg/dL (ref 8.9–10.3)
Chloride: 110 mmol/L (ref 98–111)
Creatinine, Ser: 1.28 mg/dL — ABNORMAL HIGH (ref 0.44–1.00)
GFR, Estimated: 43 mL/min — ABNORMAL LOW (ref >60)
Glucose, Bld: 143 mg/dL — ABNORMAL HIGH (ref 70–99)
Potassium: 3.7 mmol/L (ref 3.5–5.1)
Sodium: 144 mmol/L (ref 135–145)
Total Bilirubin: 0.8 mg/dL (ref 0.3–1.2)
Total Protein: 6.9 g/dL (ref 6.5–8.1)

## 2022-11-12 LAB — MAGNESIUM: Magnesium: 2.4 mg/dL (ref 1.7–2.4)

## 2022-11-12 MED ORDER — DEXAMETHASONE 4 MG PO TABS
10.0000 mg | ORAL_TABLET | Freq: Once | ORAL | Status: AC
  Administered 2022-11-12: 10 mg via ORAL
  Filled 2022-11-12: qty 3

## 2022-11-12 MED ORDER — BORTEZOMIB CHEMO SQ INJECTION 3.5 MG (2.5MG/ML)
1.3000 mg/m2 | Freq: Once | INTRAMUSCULAR | Status: AC
  Administered 2022-11-12: 2 mg via SUBCUTANEOUS
  Filled 2022-11-12: qty 0.8

## 2022-11-12 NOTE — Patient Instructions (Signed)
[facility]  Discharge Instructions: Thank you for choosing  Cancer Center to provide your oncology and hematology care.  If you have a lab appointment with the Cancer Center, please come in thru the Main Entrance and check in at the main information desk.  Wear comfortable clothing and clothing appropriate for easy access to any Portacath or PICC line.   We strive to give you quality time with your provider. You may need to reschedule your appointment if you arrive late (15 or more minutes).  Arriving late affects you and other patients whose appointments are after yours.  Also, if you miss three or more appointments without notifying the office, you may be dismissed from the clinic at the provider's discretion.      For prescription refill requests, have your pharmacy contact our office and allow 72 hours for refills to be completed.    Today you received the following chemotherapy and/or immunotherapy agents Velcade.  Bortezomib Injection What is this medication? BORTEZOMIB (bor TEZ oh mib) treats lymphoma. It may also be used to treat multiple myeloma, a type of bone marrow cancer. It works by blocking a protein that causes cancer cells to grow and multiply. This helps to slow or stop the spread of cancer cells. This medicine may be used for other purposes; ask your health care provider or pharmacist if you have questions. COMMON BRAND NAME(S): Velcade What should I tell my care team before I take this medication? They need to know if you have any of these conditions: Dehydration Diabetes Heart disease Liver disease Tingling of the fingers or toes or other nerve disorder An unusual or allergic reaction to bortezomib, other medications, foods, dyes, or preservatives If you or your partner are pregnant or trying to get pregnant Breastfeeding How should I use this medication? This medication is injected into a vein or under the skin. It is given by your  care team in a hospital or clinic setting. Talk to your care team about the use of this medication in children. Special care may be needed. Overdosage: If you think you have taken too much of this medicine contact a poison control center or emergency room at once. NOTE: This medicine is only for you. Do not share this medicine with others. What if I miss a dose? Keep appointments for follow-up doses. It is important not to miss your dose. Call your care team if you are unable to keep an appointment. What may interact with this medication? Ketoconazole Rifampin This list may not describe all possible interactions. Give your health care provider a list of all the medicines, herbs, non-prescription drugs, or dietary supplements you use. Also tell them if you smoke, drink alcohol, or use illegal drugs. Some items may interact with your medicine. What should I watch for while using this medication? Your condition will be monitored carefully while you are receiving this medication. You may need blood work while taking this medication. This medication may affect your coordination, reaction time, or judgment. Do not drive or operate machinery until you know how this medication affects you. Sit up or stand slowly to reduce the risk of dizzy or fainting spells. Drinking alcohol with this medication can increase the risk of these side effects. This medication may increase your risk of getting an infection. Call your care team for advice if you get a fever, chills, sore throat, or other symptoms of a cold or flu. Do not treat yourself. Try to avoid being around   people who are sick. Check with your care team if you have severe diarrhea, nausea, and vomiting, or if you sweat a lot. The loss of too much body fluid may make it dangerous for you to take this medication. Talk to your care team if you may be pregnant. Serious birth defects can occur if you take this medication during pregnancy and for 7 months after the  last dose. You will need a negative pregnancy test before starting this medication. Contraception is recommended while taking this medication and for 7 months after the last dose. Your care team can help you find the option that works for you. If your partner can get pregnant, use a condom during sex while taking this medication and for 4 months after the last dose. Do not breastfeed while taking this medication and for 2 months after the last dose. This medication may cause infertility. Talk to your care team if you are concerned about your fertility. What side effects may I notice from receiving this medication? Side effects that you should report to your care team as soon as possible: Allergic reactions--skin rash, itching, hives, swelling of the face, lips, tongue, or throat Bleeding--bloody or black, tar-like stools, vomiting blood or brown material that looks like coffee grounds, red or dark brown urine, small red or purple spots on skin, unusual bruising or bleeding Bleeding in the brain--severe headache, stiff neck, confusion, dizziness, change in vision, numbness or weakness of the face, arm, or leg, trouble speaking, trouble walking, vomiting Bowel blockage--stomach cramping, unable to have a bowel movement or pass gas, loss of appetite, vomiting Heart failure--shortness of breath, swelling of the ankles, feet, or hands, sudden weight gain, unusual weakness or fatigue Infection--fever, chills, cough, sore throat, wounds that don't heal, pain or trouble when passing urine, general feeling of discomfort or being unwell Liver injury--right upper belly pain, loss of appetite, nausea, light-colored stool, dark yellow or brown urine, yellowing skin or eyes, unusual weakness or fatigue Low blood pressure--dizziness, feeling faint or lightheaded, blurry vision Lung injury--shortness of breath or trouble breathing, cough, spitting up blood, chest pain, fever Pain, tingling, or numbness in the hands  or feet Severe or prolonged diarrhea Stomach pain, bloody diarrhea, pale skin, unusual weakness or fatigue, decrease in the amount of urine, which may be signs of hemolytic uremic syndrome Sudden and severe headache, confusion, change in vision, seizures, which may be signs of posterior reversible encephalopathy syndrome (PRES) TTP--purple spots on the skin or inside the mouth, pale skin, yellowing skin or eyes, unusual weakness or fatigue, fever, fast or irregular heartbeat, confusion, change in vision, trouble speaking, trouble walking Tumor lysis syndrome (TLS)--nausea, vomiting, diarrhea, decrease in the amount of urine, dark urine, unusual weakness or fatigue, confusion, muscle pain or cramps, fast or irregular heartbeat, joint pain Side effects that usually do not require medical attention (report to your care team if they continue or are bothersome): Constipation Diarrhea Fatigue Loss of appetite Nausea This list may not describe all possible side effects. Call your doctor for medical advice about side effects. You may report side effects to FDA at 1-800-FDA-1088. Where should I keep my medication? This medication is given in a hospital or clinic. It will not be stored at home. NOTE: This sheet is a summary. It may not cover all possible information. If you have questions about this medicine, talk to your doctor, pharmacist, or health care provider.  2023 Elsevier/Gold Standard (2022-04-30 00:00:00)        To help   prevent nausea and vomiting after your treatment, we encourage you to take your nausea medication as directed.  BELOW ARE SYMPTOMS THAT SHOULD BE REPORTED IMMEDIATELY: *FEVER GREATER THAN 100.4 F (38 C) OR HIGHER *CHILLS OR SWEATING *NAUSEA AND VOMITING THAT IS NOT CONTROLLED WITH YOUR NAUSEA MEDICATION *UNUSUAL SHORTNESS OF BREATH *UNUSUAL BRUISING OR BLEEDING *URINARY PROBLEMS (pain or burning when urinating, or frequent urination) *BOWEL PROBLEMS (unusual diarrhea,  constipation, pain near the anus) TENDERNESS IN MOUTH AND THROAT WITH OR WITHOUT PRESENCE OF ULCERS (sore throat, sores in mouth, or a toothache) UNUSUAL RASH, SWELLING OR PAIN  UNUSUAL VAGINAL DISCHARGE OR ITCHING   Items with * indicate a potential emergency and should be followed up as soon as possible or go to the Emergency Department if any problems should occur.  Please show the CHEMOTHERAPY ALERT CARD or IMMUNOTHERAPY ALERT CARD at check-in to the Emergency Department and triage nurse.  Should you have questions after your visit or need to cancel or reschedule your appointment, please contact [facility] [phone]  and follow the prompts.  Office hours are 8:00 a.m. to 4:30 p.m. Monday - Friday. Please note that voicemails left after 4:00 p.m. may not be returned until the following business day.  We are closed weekends and major holidays. You have access to a nurse at all times for urgent questions. Please call the main number to the clinic [phone] and follow the prompts.  For any non-urgent questions, you may also contact your provider using MyChart. We now offer e-Visits for anyone 18 and older to request care online for non-urgent symptoms. For details visit [email or web address].   Also download the MyChart app! Go to the app store, search "MyChart", open the app, select [place], and log in with your MyChart username and password.  Masks are optional in the cancer centers. If you would like for your care team to wear a mask while they are taking care of you, please let them know. You may have one support person who is at least 78 years old accompany you for your appointments.  

## 2022-11-12 NOTE — Progress Notes (Signed)
Patient presents today for velcade injection. Vital signs stable. Patient denies any side effects related to treatment. Patient has no complaints of concerns today.   Velcade injection given today per MD orders. Tolerated without adverse affects. Vital signs stable. No complaints at this time. Discharged from clinic by wheel chair accompanied by aid from the [facility] center in stable condition. Alert and oriented x 3. F/U with [facility] as scheduled.

## 2022-11-26 ENCOUNTER — Other Ambulatory Visit: Payer: Self-pay

## 2022-11-26 DIAGNOSIS — C9000 Multiple myeloma not having achieved remission: Secondary | ICD-10-CM

## 2022-11-27 ENCOUNTER — Inpatient Hospital Stay: Payer: Medicare PPO | Attending: Hematology

## 2022-11-27 ENCOUNTER — Inpatient Hospital Stay: Payer: Medicare PPO

## 2022-11-27 VITALS — BP 152/86 | HR 83 | Temp 97.9°F | Resp 18 | Wt 122.3 lb

## 2022-11-27 DIAGNOSIS — C9000 Multiple myeloma not having achieved remission: Secondary | ICD-10-CM

## 2022-11-27 DIAGNOSIS — Z5112 Encounter for antineoplastic immunotherapy: Secondary | ICD-10-CM | POA: Diagnosis not present

## 2022-11-27 LAB — CBC WITH DIFFERENTIAL/PLATELET
Abs Immature Granulocytes: 0.01 10*3/uL (ref 0.00–0.07)
Basophils Absolute: 0 10*3/uL (ref 0.0–0.1)
Basophils Relative: 1 %
Eosinophils Absolute: 0.1 10*3/uL (ref 0.0–0.5)
Eosinophils Relative: 3 %
HCT: 37.2 % (ref 36.0–46.0)
Hemoglobin: 11.8 g/dL — ABNORMAL LOW (ref 12.0–15.0)
Immature Granulocytes: 0 %
Lymphocytes Relative: 14 %
Lymphs Abs: 0.7 10*3/uL (ref 0.7–4.0)
MCH: 32.4 pg (ref 26.0–34.0)
MCHC: 31.7 g/dL (ref 30.0–36.0)
MCV: 102.2 fL — ABNORMAL HIGH (ref 80.0–100.0)
Monocytes Absolute: 0.3 10*3/uL (ref 0.1–1.0)
Monocytes Relative: 6 %
Neutro Abs: 3.5 10*3/uL (ref 1.7–7.7)
Neutrophils Relative %: 76 %
Platelets: 120 10*3/uL — ABNORMAL LOW (ref 150–400)
RBC: 3.64 MIL/uL — ABNORMAL LOW (ref 3.87–5.11)
RDW: 14.8 % (ref 11.5–15.5)
WBC: 4.6 10*3/uL (ref 4.0–10.5)
nRBC: 0 % (ref 0.0–0.2)

## 2022-11-27 LAB — COMPREHENSIVE METABOLIC PANEL
ALT: 20 U/L (ref 0–44)
AST: 41 U/L (ref 15–41)
Albumin: 3.5 g/dL (ref 3.5–5.0)
Alkaline Phosphatase: 78 U/L (ref 38–126)
Anion gap: 7 (ref 5–15)
BUN: 23 mg/dL (ref 8–23)
CO2: 24 mmol/L (ref 22–32)
Calcium: 8.8 mg/dL — ABNORMAL LOW (ref 8.9–10.3)
Chloride: 107 mmol/L (ref 98–111)
Creatinine, Ser: 1.27 mg/dL — ABNORMAL HIGH (ref 0.44–1.00)
GFR, Estimated: 43 mL/min — ABNORMAL LOW (ref >60)
Glucose, Bld: 163 mg/dL — ABNORMAL HIGH (ref 70–99)
Potassium: 3.6 mmol/L (ref 3.5–5.1)
Sodium: 138 mmol/L (ref 135–145)
Total Bilirubin: 0.4 mg/dL (ref 0.3–1.2)
Total Protein: 7.3 g/dL (ref 6.5–8.1)

## 2022-11-27 LAB — MAGNESIUM: Magnesium: 2.4 mg/dL (ref 1.7–2.4)

## 2022-11-27 MED ORDER — DEXAMETHASONE 4 MG PO TABS
10.0000 mg | ORAL_TABLET | Freq: Once | ORAL | Status: AC
  Administered 2022-11-27: 10 mg via ORAL
  Filled 2022-11-27: qty 3

## 2022-11-27 MED ORDER — BORTEZOMIB CHEMO SQ INJECTION 3.5 MG (2.5MG/ML)
1.3000 mg/m2 | Freq: Once | INTRAMUSCULAR | Status: AC
  Administered 2022-11-27: 2 mg via SUBCUTANEOUS
  Filled 2022-11-27: qty 0.8

## 2022-11-27 NOTE — Progress Notes (Signed)
Velcade given today per MD orders. Tolerated infusion without adverse affects. Vital signs stable. No complaints at this time. Discharged from clinic via wheelchair in stable condition. Alert and oriented x 3. F/U with [facility] as scheduled.

## 2022-11-27 NOTE — Patient Instructions (Signed)
[facility]  Discharge Instructions: Thank you for choosing [facility] to provide your oncology and hematology care.  If you have a lab appointment with the [facility], please come in thru the Main Entrance and check in at the main information desk.  Wear comfortable clothing and clothing appropriate for easy access to any Portacath or PICC line.   We strive to give you quality time with your provider. You may need to reschedule your appointment if you arrive late (15 or more minutes).  Arriving late affects you and other patients whose appointments are after yours.  Also, if you miss three or more appointments without notifying the office, you may be dismissed from the clinic at the provider's discretion.      For prescription refill requests, have your pharmacy contact our office and allow 72 hours for refills to be completed.    Today you received the following chemotherapy and/or immunotherapy agents Velcade   To help prevent nausea and vomiting after your treatment, we encourage you to take your nausea medication as directed.  Bortezomib Injection What is this medication? BORTEZOMIB (bor TEZ oh mib) treats lymphoma. It may also be used to treat multiple myeloma, a type of bone marrow cancer. It works by blocking a protein that causes cancer cells to grow and multiply. This helps to slow or stop the spread of cancer cells. This medicine may be used for other purposes; ask your health care provider or pharmacist if you have questions. COMMON BRAND NAME(S): Velcade What should I tell my care team before I take this medication? They need to know if you have any of these conditions: Dehydration Diabetes Heart disease Liver disease Tingling of the fingers or toes or other nerve disorder An unusual or allergic reaction to bortezomib, other medications, foods, dyes, or preservatives If you or your partner are pregnant or trying to get  pregnant Breastfeeding How should I use this medication? This medication is injected into a vein or under the skin. It is given by your care team in a hospital or clinic setting. Talk to your care team about the use of this medication in children. Special care may be needed. Overdosage: If you think you have taken too much of this medicine contact a poison control center or emergency room at once. NOTE: This medicine is only for you. Do not share this medicine with others. What if I miss a dose? Keep appointments for follow-up doses. It is important not to miss your dose. Call your care team if you are unable to keep an appointment. What may interact with this medication? Ketoconazole Rifampin This list may not describe all possible interactions. Give your health care provider a list of all the medicines, herbs, non-prescription drugs, or dietary supplements you use. Also tell them if you smoke, drink alcohol, or use illegal drugs. Some items may interact with your medicine. What should I watch for while using this medication? Your condition will be monitored carefully while you are receiving this medication. You may need blood work while taking this medication. This medication may affect your coordination, reaction time, or judgment. Do not drive or operate machinery until you know how this medication affects you. Sit up or stand slowly to reduce the risk of dizzy or fainting spells. Drinking alcohol with this medication can increase the risk of these side effects. This medication may increase your risk of getting an infection. Call your care team for advice if you get a  fever, chills, sore throat, or other symptoms of a cold or flu. Do not treat yourself. Try to avoid being around people who are sick. Check with your care team if you have severe diarrhea, nausea, and vomiting, or if you sweat a lot. The loss of too much body fluid may make it dangerous for you to take this medication. Talk to  your care team if you may be pregnant. Serious birth defects can occur if you take this medication during pregnancy and for 7 months after the last dose. You will need a negative pregnancy test before starting this medication. Contraception is recommended while taking this medication and for 7 months after the last dose. Your care team can help you find the option that works for you. If your partner can get pregnant, use a condom during sex while taking this medication and for 4 months after the last dose. Do not breastfeed while taking this medication and for 2 months after the last dose. This medication may cause infertility. Talk to your care team if you are concerned about your fertility. What side effects may I notice from receiving this medication? Side effects that you should report to your care team as soon as possible: Allergic reactions--skin rash, itching, hives, swelling of the face, lips, tongue, or throat Bleeding--bloody or black, tar-like stools, vomiting blood or brown material that looks like coffee grounds, red or dark brown urine, small red or purple spots on skin, unusual bruising or bleeding Bleeding in the brain--severe headache, stiff neck, confusion, dizziness, change in vision, numbness or weakness of the face, arm, or leg, trouble speaking, trouble walking, vomiting Bowel blockage--stomach cramping, unable to have a bowel movement or pass gas, loss of appetite, vomiting Heart failure--shortness of breath, swelling of the ankles, feet, or hands, sudden weight gain, unusual weakness or fatigue Infection--fever, chills, cough, sore throat, wounds that don't heal, pain or trouble when passing urine, general feeling of discomfort or being unwell Liver injury--right upper belly pain, loss of appetite, nausea, light-colored stool, dark yellow or brown urine, yellowing skin or eyes, unusual weakness or fatigue Low blood pressure--dizziness, feeling faint or lightheaded, blurry  vision Lung injury--shortness of breath or trouble breathing, cough, spitting up blood, chest pain, fever Pain, tingling, or numbness in the hands or feet Severe or prolonged diarrhea Stomach pain, bloody diarrhea, pale skin, unusual weakness or fatigue, decrease in the amount of urine, which may be signs of hemolytic uremic syndrome Sudden and severe headache, confusion, change in vision, seizures, which may be signs of posterior reversible encephalopathy syndrome (PRES) TTP--purple spots on the skin or inside the mouth, pale skin, yellowing skin or eyes, unusual weakness or fatigue, fever, fast or irregular heartbeat, confusion, change in vision, trouble speaking, trouble walking Tumor lysis syndrome (TLS)--nausea, vomiting, diarrhea, decrease in the amount of urine, dark urine, unusual weakness or fatigue, confusion, muscle pain or cramps, fast or irregular heartbeat, joint pain Side effects that usually do not require medical attention (report to your care team if they continue or are bothersome): Constipation Diarrhea Fatigue Loss of appetite Nausea This list may not describe all possible side effects. Call your doctor for medical advice about side effects. You may report side effects to FDA at 1-800-FDA-1088. Where should I keep my medication? This medication is given in a hospital or clinic. It will not be stored at home. NOTE: This sheet is a summary. It may not cover all possible information. If you have questions about this medicine, talk to your  doctor, pharmacist, or health care provider.  2023 Elsevier/Gold Standard (2022-04-30 00:00:00)   BELOW ARE SYMPTOMS THAT SHOULD BE REPORTED IMMEDIATELY: *FEVER GREATER THAN 100.4 F (38 C) OR HIGHER *CHILLS OR SWEATING *NAUSEA AND VOMITING THAT IS NOT CONTROLLED WITH YOUR NAUSEA MEDICATION *UNUSUAL SHORTNESS OF BREATH *UNUSUAL BRUISING OR BLEEDING *URINARY PROBLEMS (pain or burning when urinating, or frequent urination) *BOWEL  PROBLEMS (unusual diarrhea, constipation, pain near the anus) TENDERNESS IN MOUTH AND THROAT WITH OR WITHOUT PRESENCE OF ULCERS (sore throat, sores in mouth, or a toothache) UNUSUAL RASH, SWELLING OR PAIN  UNUSUAL VAGINAL DISCHARGE OR ITCHING   Items with * indicate a potential emergency and should be followed up as soon as possible or go to the Emergency Department if any problems should occur.  Please show the CHEMOTHERAPY ALERT CARD or IMMUNOTHERAPY ALERT CARD at check-in to the Emergency Department and triage nurse.  Should you have questions after your visit or need to cancel or reschedule your appointment, please contact [facility] [phone]  and follow the prompts.  Office hours are 8:00 a.m. to 4:30 p.m. Monday - Friday. Please note that voicemails left after 4:00 p.m. may not be returned until the following business day.  We are closed weekends and major holidays. You have access to a nurse at all times for urgent questions. Please call the main number to the clinic [phone] and follow the prompts.  For any non-urgent questions, you may also contact your provider using MyChart. We now offer e-Visits for anyone 67 and older to request care online for non-urgent symptoms. For details visit [email or web address].   Also download the MyChart app! Go to the app store, search "MyChart", open the app, select , and log in with your MyChart username and password.  Masks are optional in the cancer centers. If you would like for your care team to wear a mask while they are taking care of you, please let them know. You may have one support person who is at least 78 years old accompany you for your appointments.

## 2022-11-27 NOTE — Progress Notes (Signed)
Patient presents today for Velcade injection. Labs within parameters for treatment today.

## 2022-12-03 ENCOUNTER — Other Ambulatory Visit: Payer: Self-pay

## 2022-12-03 DIAGNOSIS — C9000 Multiple myeloma not having achieved remission: Secondary | ICD-10-CM

## 2022-12-04 ENCOUNTER — Inpatient Hospital Stay: Payer: Medicare PPO

## 2022-12-04 ENCOUNTER — Ambulatory Visit: Payer: Medicare PPO

## 2022-12-04 VITALS — BP 156/89 | HR 63 | Temp 96.9°F | Resp 18 | Wt 120.6 lb

## 2022-12-04 DIAGNOSIS — C9000 Multiple myeloma not having achieved remission: Secondary | ICD-10-CM

## 2022-12-04 DIAGNOSIS — Z5112 Encounter for antineoplastic immunotherapy: Secondary | ICD-10-CM | POA: Diagnosis not present

## 2022-12-04 LAB — CBC WITH DIFFERENTIAL/PLATELET
Abs Immature Granulocytes: 0.02 10*3/uL (ref 0.00–0.07)
Basophils Absolute: 0 10*3/uL (ref 0.0–0.1)
Basophils Relative: 1 %
Eosinophils Absolute: 0.2 10*3/uL (ref 0.0–0.5)
Eosinophils Relative: 4 %
HCT: 36.4 % (ref 36.0–46.0)
Hemoglobin: 11.9 g/dL — ABNORMAL LOW (ref 12.0–15.0)
Immature Granulocytes: 0 %
Lymphocytes Relative: 12 %
Lymphs Abs: 0.6 10*3/uL — ABNORMAL LOW (ref 0.7–4.0)
MCH: 33.2 pg (ref 26.0–34.0)
MCHC: 32.7 g/dL (ref 30.0–36.0)
MCV: 101.7 fL — ABNORMAL HIGH (ref 80.0–100.0)
Monocytes Absolute: 0.5 10*3/uL (ref 0.1–1.0)
Monocytes Relative: 11 %
Neutro Abs: 3.4 10*3/uL (ref 1.7–7.7)
Neutrophils Relative %: 72 %
Platelets: 100 10*3/uL — ABNORMAL LOW (ref 150–400)
RBC: 3.58 MIL/uL — ABNORMAL LOW (ref 3.87–5.11)
RDW: 15 % (ref 11.5–15.5)
WBC: 4.6 10*3/uL (ref 4.0–10.5)
nRBC: 0 % (ref 0.0–0.2)

## 2022-12-04 LAB — COMPREHENSIVE METABOLIC PANEL
ALT: 20 U/L (ref 0–44)
AST: 31 U/L (ref 15–41)
Albumin: 3.5 g/dL (ref 3.5–5.0)
Alkaline Phosphatase: 65 U/L (ref 38–126)
Anion gap: 8 (ref 5–15)
BUN: 19 mg/dL (ref 8–23)
CO2: 23 mmol/L (ref 22–32)
Calcium: 8.9 mg/dL (ref 8.9–10.3)
Chloride: 109 mmol/L (ref 98–111)
Creatinine, Ser: 1.16 mg/dL — ABNORMAL HIGH (ref 0.44–1.00)
GFR, Estimated: 48 mL/min — ABNORMAL LOW (ref >60)
Glucose, Bld: 82 mg/dL (ref 70–99)
Potassium: 4 mmol/L (ref 3.5–5.1)
Sodium: 140 mmol/L (ref 135–145)
Total Bilirubin: 0.6 mg/dL (ref 0.3–1.2)
Total Protein: 7 g/dL (ref 6.5–8.1)

## 2022-12-04 LAB — MAGNESIUM: Magnesium: 2.2 mg/dL (ref 1.7–2.4)

## 2022-12-04 MED ORDER — BORTEZOMIB CHEMO SQ INJECTION 3.5 MG (2.5MG/ML)
1.3000 mg/m2 | Freq: Once | INTRAMUSCULAR | Status: AC
  Administered 2022-12-04: 2 mg via SUBCUTANEOUS
  Filled 2022-12-04: qty 0.8

## 2022-12-04 MED ORDER — DEXAMETHASONE 4 MG PO TABS
10.0000 mg | ORAL_TABLET | Freq: Once | ORAL | Status: AC
  Administered 2022-12-04: 10 mg via ORAL
  Filled 2022-12-04: qty 3

## 2022-12-04 NOTE — Patient Instructions (Signed)
[facility]  Discharge Instructions: Thank you for choosing [facility] to provide your oncology and hematology care.  If you have a lab appointment with the [facility], please come in thru the Main Entrance and check in at the main information desk.  Wear comfortable clothing and clothing appropriate for easy access to any Portacath or PICC line.   We strive to give you quality time with your provider. You may need to reschedule your appointment if you arrive late (15 or more minutes).  Arriving late affects you and other patients whose appointments are after yours.  Also, if you miss three or more appointments without notifying the office, you may be dismissed from the clinic at the provider's discretion.      For prescription refill requests, have your pharmacy contact our office and allow 72 hours for refills to be completed.    Today you received the following chemotherapy and/or immunotherapy agents Velcade   To help prevent nausea and vomiting after your treatment, we encourage you to take your nausea medication as directed.  Bortezomib Injection What is this medication? BORTEZOMIB (bor TEZ oh mib) treats lymphoma. It may also be used to treat multiple myeloma, a type of bone marrow cancer. It works by blocking a protein that causes cancer cells to grow and multiply. This helps to slow or stop the spread of cancer cells. This medicine may be used for other purposes; ask your health care provider or pharmacist if you have questions. COMMON BRAND NAME(S): Velcade What should I tell my care team before I take this medication? They need to know if you have any of these conditions: Dehydration Diabetes Heart disease Liver disease Tingling of the fingers or toes or other nerve disorder An unusual or allergic reaction to bortezomib, other medications, foods, dyes, or preservatives If you or your partner are pregnant or trying to get  pregnant Breastfeeding How should I use this medication? This medication is injected into a vein or under the skin. It is given by your care team in a hospital or clinic setting. Talk to your care team about the use of this medication in children. Special care may be needed. Overdosage: If you think you have taken too much of this medicine contact a poison control center or emergency room at once. NOTE: This medicine is only for you. Do not share this medicine with others. What if I miss a dose? Keep appointments for follow-up doses. It is important not to miss your dose. Call your care team if you are unable to keep an appointment. What may interact with this medication? Ketoconazole Rifampin This list may not describe all possible interactions. Give your health care provider a list of all the medicines, herbs, non-prescription drugs, or dietary supplements you use. Also tell them if you smoke, drink alcohol, or use illegal drugs. Some items may interact with your medicine. What should I watch for while using this medication? Your condition will be monitored carefully while you are receiving this medication. You may need blood work while taking this medication. This medication may affect your coordination, reaction time, or judgment. Do not drive or operate machinery until you know how this medication affects you. Sit up or stand slowly to reduce the risk of dizzy or fainting spells. Drinking alcohol with this medication can increase the risk of these side effects. This medication may increase your risk of getting an infection. Call your care team for advice if you get a  fever, chills, sore throat, or other symptoms of a cold or flu. Do not treat yourself. Try to avoid being around people who are sick. Check with your care team if you have severe diarrhea, nausea, and vomiting, or if you sweat a lot. The loss of too much body fluid may make it dangerous for you to take this medication. Talk to  your care team if you may be pregnant. Serious birth defects can occur if you take this medication during pregnancy and for 7 months after the last dose. You will need a negative pregnancy test before starting this medication. Contraception is recommended while taking this medication and for 7 months after the last dose. Your care team can help you find the option that works for you. If your partner can get pregnant, use a condom during sex while taking this medication and for 4 months after the last dose. Do not breastfeed while taking this medication and for 2 months after the last dose. This medication may cause infertility. Talk to your care team if you are concerned about your fertility. What side effects may I notice from receiving this medication? Side effects that you should report to your care team as soon as possible: Allergic reactions--skin rash, itching, hives, swelling of the face, lips, tongue, or throat Bleeding--bloody or black, tar-like stools, vomiting blood or brown material that looks like coffee grounds, red or dark brown urine, small red or purple spots on skin, unusual bruising or bleeding Bleeding in the brain--severe headache, stiff neck, confusion, dizziness, change in vision, numbness or weakness of the face, arm, or leg, trouble speaking, trouble walking, vomiting Bowel blockage--stomach cramping, unable to have a bowel movement or pass gas, loss of appetite, vomiting Heart failure--shortness of breath, swelling of the ankles, feet, or hands, sudden weight gain, unusual weakness or fatigue Infection--fever, chills, cough, sore throat, wounds that don't heal, pain or trouble when passing urine, general feeling of discomfort or being unwell Liver injury--right upper belly pain, loss of appetite, nausea, light-colored stool, dark yellow or brown urine, yellowing skin or eyes, unusual weakness or fatigue Low blood pressure--dizziness, feeling faint or lightheaded, blurry  vision Lung injury--shortness of breath or trouble breathing, cough, spitting up blood, chest pain, fever Pain, tingling, or numbness in the hands or feet Severe or prolonged diarrhea Stomach pain, bloody diarrhea, pale skin, unusual weakness or fatigue, decrease in the amount of urine, which may be signs of hemolytic uremic syndrome Sudden and severe headache, confusion, change in vision, seizures, which may be signs of posterior reversible encephalopathy syndrome (PRES) TTP--purple spots on the skin or inside the mouth, pale skin, yellowing skin or eyes, unusual weakness or fatigue, fever, fast or irregular heartbeat, confusion, change in vision, trouble speaking, trouble walking Tumor lysis syndrome (TLS)--nausea, vomiting, diarrhea, decrease in the amount of urine, dark urine, unusual weakness or fatigue, confusion, muscle pain or cramps, fast or irregular heartbeat, joint pain Side effects that usually do not require medical attention (report to your care team if they continue or are bothersome): Constipation Diarrhea Fatigue Loss of appetite Nausea This list may not describe all possible side effects. Call your doctor for medical advice about side effects. You may report side effects to FDA at 1-800-FDA-1088. Where should I keep my medication? This medication is given in a hospital or clinic. It will not be stored at home. NOTE: This sheet is a summary. It may not cover all possible information. If you have questions about this medicine, talk to your  doctor, pharmacist, or health care provider.  2023 Elsevier/Gold Standard (2022-04-30 00:00:00)   BELOW ARE SYMPTOMS THAT SHOULD BE REPORTED IMMEDIATELY: *FEVER GREATER THAN 100.4 F (38 C) OR HIGHER *CHILLS OR SWEATING *NAUSEA AND VOMITING THAT IS NOT CONTROLLED WITH YOUR NAUSEA MEDICATION *UNUSUAL SHORTNESS OF BREATH *UNUSUAL BRUISING OR BLEEDING *URINARY PROBLEMS (pain or burning when urinating, or frequent urination) *BOWEL  PROBLEMS (unusual diarrhea, constipation, pain near the anus) TENDERNESS IN MOUTH AND THROAT WITH OR WITHOUT PRESENCE OF ULCERS (sore throat, sores in mouth, or a toothache) UNUSUAL RASH, SWELLING OR PAIN  UNUSUAL VAGINAL DISCHARGE OR ITCHING   Items with * indicate a potential emergency and should be followed up as soon as possible or go to the Emergency Department if any problems should occur.  Please show the CHEMOTHERAPY ALERT CARD or IMMUNOTHERAPY ALERT CARD at check-in to the Emergency Department and triage nurse.  Should you have questions after your visit or need to cancel or reschedule your appointment, please contact [facility] [phone]  and follow the prompts.  Office hours are 8:00 a.m. to 4:30 p.m. Monday - Friday. Please note that voicemails left after 4:00 p.m. may not be returned until the following business day.  We are closed weekends and major holidays. You have access to a nurse at all times for urgent questions. Please call the main number to the clinic [phone] and follow the prompts.  For any non-urgent questions, you may also contact your provider using MyChart. We now offer e-Visits for anyone 65 and older to request care online for non-urgent symptoms. For details visit [email or web address].   Also download the MyChart app! Go to the app store, search "MyChart", open the app, select [facility], and log in with your MyChart username and password.  Masks are optional in the cancer centers. If you would like for your care team to wear a mask while they are taking care of you, please let them know. You may have one support person who is at least 78 years old accompany you for your appointments.

## 2022-12-04 NOTE — Progress Notes (Signed)
Pt presents today for Velcade per provider's order. Vital signs and labs WNL for treatment. Okay to proceed with treatment today.   Velcade given today per MD orders. Tolerated infusion without adverse affects. Vital signs stable. No complaints at this time. Discharged from clinic via wheelchair in stable condition. Alert and oriented x 3. F/U with [facility] as scheduled.   

## 2022-12-09 ENCOUNTER — Other Ambulatory Visit: Payer: Self-pay

## 2022-12-09 DIAGNOSIS — C9000 Multiple myeloma not having achieved remission: Secondary | ICD-10-CM

## 2022-12-10 ENCOUNTER — Inpatient Hospital Stay: Payer: Medicare PPO

## 2022-12-10 DIAGNOSIS — C9000 Multiple myeloma not having achieved remission: Secondary | ICD-10-CM

## 2022-12-10 DIAGNOSIS — Z5112 Encounter for antineoplastic immunotherapy: Secondary | ICD-10-CM | POA: Diagnosis not present

## 2022-12-10 LAB — COMPREHENSIVE METABOLIC PANEL
ALT: 18 U/L (ref 0–44)
AST: 28 U/L (ref 15–41)
Albumin: 3.3 g/dL — ABNORMAL LOW (ref 3.5–5.0)
Alkaline Phosphatase: 63 U/L (ref 38–126)
Anion gap: 6 (ref 5–15)
BUN: 29 mg/dL — ABNORMAL HIGH (ref 8–23)
CO2: 24 mmol/L (ref 22–32)
Calcium: 8.9 mg/dL (ref 8.9–10.3)
Chloride: 113 mmol/L — ABNORMAL HIGH (ref 98–111)
Creatinine, Ser: 1.56 mg/dL — ABNORMAL HIGH (ref 0.44–1.00)
GFR, Estimated: 34 mL/min — ABNORMAL LOW (ref >60)
Glucose, Bld: 186 mg/dL — ABNORMAL HIGH (ref 70–99)
Potassium: 4.5 mmol/L (ref 3.5–5.1)
Sodium: 143 mmol/L (ref 135–145)
Total Bilirubin: 0.5 mg/dL (ref 0.3–1.2)
Total Protein: 6.8 g/dL (ref 6.5–8.1)

## 2022-12-10 LAB — CBC WITH DIFFERENTIAL/PLATELET
Abs Immature Granulocytes: 0.01 10*3/uL (ref 0.00–0.07)
Basophils Absolute: 0 10*3/uL (ref 0.0–0.1)
Basophils Relative: 0 %
Eosinophils Absolute: 0.3 10*3/uL (ref 0.0–0.5)
Eosinophils Relative: 6 %
HCT: 37.9 % (ref 36.0–46.0)
Hemoglobin: 12.4 g/dL (ref 12.0–15.0)
Immature Granulocytes: 0 %
Lymphocytes Relative: 9 %
Lymphs Abs: 0.5 10*3/uL — ABNORMAL LOW (ref 0.7–4.0)
MCH: 33.1 pg (ref 26.0–34.0)
MCHC: 32.7 g/dL (ref 30.0–36.0)
MCV: 101.1 fL — ABNORMAL HIGH (ref 80.0–100.0)
Monocytes Absolute: 0.7 10*3/uL (ref 0.1–1.0)
Monocytes Relative: 14 %
Neutro Abs: 3.6 10*3/uL (ref 1.7–7.7)
Neutrophils Relative %: 71 %
Platelets: 72 10*3/uL — ABNORMAL LOW (ref 150–400)
RBC: 3.75 MIL/uL — ABNORMAL LOW (ref 3.87–5.11)
RDW: 15.3 % (ref 11.5–15.5)
Smear Review: DECREASED
WBC: 5.1 10*3/uL (ref 4.0–10.5)
nRBC: 0 % (ref 0.0–0.2)

## 2022-12-10 LAB — MAGNESIUM: Magnesium: 2.6 mg/dL — ABNORMAL HIGH (ref 1.7–2.4)

## 2022-12-11 ENCOUNTER — Other Ambulatory Visit: Payer: Medicare PPO

## 2022-12-11 ENCOUNTER — Inpatient Hospital Stay: Payer: Medicare PPO

## 2022-12-11 ENCOUNTER — Ambulatory Visit: Payer: Medicare PPO

## 2022-12-11 VITALS — BP 146/88 | HR 80 | Temp 97.0°F | Resp 18 | Wt 119.0 lb

## 2022-12-11 DIAGNOSIS — C9000 Multiple myeloma not having achieved remission: Secondary | ICD-10-CM | POA: Diagnosis not present

## 2022-12-11 DIAGNOSIS — Z5112 Encounter for antineoplastic immunotherapy: Secondary | ICD-10-CM | POA: Diagnosis not present

## 2022-12-11 LAB — KAPPA/LAMBDA LIGHT CHAINS
Kappa free light chain: 22.8 mg/L — ABNORMAL HIGH (ref 3.3–19.4)
Kappa, lambda light chain ratio: 0.8 (ref 0.26–1.65)
Lambda free light chains: 28.4 mg/L — ABNORMAL HIGH (ref 5.7–26.3)

## 2022-12-11 MED ORDER — DEXAMETHASONE 4 MG PO TABS
10.0000 mg | ORAL_TABLET | Freq: Once | ORAL | Status: AC
  Administered 2022-12-11: 10 mg via ORAL
  Filled 2022-12-11: qty 3

## 2022-12-11 MED ORDER — BORTEZOMIB CHEMO SQ INJECTION 3.5 MG (2.5MG/ML)
1.3000 mg/m2 | Freq: Once | INTRAMUSCULAR | Status: AC
  Administered 2022-12-11: 2 mg via SUBCUTANEOUS
  Filled 2022-12-11: qty 0.8

## 2022-12-11 NOTE — Patient Instructions (Signed)
[facility]  Discharge Instructions: Thank you for choosing [facility] to provide your oncology and hematology care.  If you have a lab appointment with the Cancer Center, please come in thru the Main Entrance and check in at the main information desk.  Wear comfortable clothing and clothing appropriate for easy access to any Portacath or PICC line.   We strive to give you quality time with your provider. You may need to reschedule your appointment if you arrive late (15 or more minutes).  Arriving late affects you and other patients whose appointments are after yours.  Also, if you miss three or more appointments without notifying the office, you may be dismissed from the clinic at the provider's discretion.      For prescription refill requests, have your pharmacy contact our office and allow 72 hours for refills to be completed.    Today you received the following chemotherapy and/or immunotherapy agents Velcade      To help prevent nausea and vomiting after your treatment, we encourage you to take your nausea medication as directed.  BELOW ARE SYMPTOMS THAT SHOULD BE REPORTED IMMEDIATELY: *FEVER GREATER THAN 100.4 F (38 C) OR HIGHER *CHILLS OR SWEATING *NAUSEA AND VOMITING THAT IS NOT CONTROLLED WITH YOUR NAUSEA MEDICATION *UNUSUAL SHORTNESS OF BREATH *UNUSUAL BRUISING OR BLEEDING *URINARY PROBLEMS (pain or burning when urinating, or frequent urination) *BOWEL PROBLEMS (unusual diarrhea, constipation, pain near the anus) TENDERNESS IN MOUTH AND THROAT WITH OR WITHOUT PRESENCE OF ULCERS (sore throat, sores in mouth, or a toothache) UNUSUAL RASH, SWELLING OR PAIN  UNUSUAL VAGINAL DISCHARGE OR ITCHING   Items with * indicate a potential emergency and should be followed up as soon as possible or go to the Emergency Department if any problems should occur.  Please show the CHEMOTHERAPY ALERT CARD or IMMUNOTHERAPY ALERT CARD at check-in to the Emergency  Department and triage nurse.  Should you have questions after your visit or need to cancel or reschedule your appointment, please contact [facility] [phone]  and follow the prompts.  Office hours are 8:00 a.m. to 4:30 p.m. Monday - Friday. Please note that voicemails left after 4:00 p.m. may not be returned until the following business day.  We are closed weekends and major holidays. You have access to a nurse at all times for urgent questions. Please call the main number to the clinic [phone] and follow the prompts.  For any non-urgent questions, you may also contact your provider using MyChart. We now offer e-Visits for anyone 18 and older to request care online for non-urgent symptoms. For details visit [email or web address].   Also download the MyChart app! Go to the app store, search "MyChart", open the app, select [facility], and log in with your MyChart username and password.   

## 2022-12-11 NOTE — Progress Notes (Signed)
Patient presents today for Velcade injection per providers order vital signs within parameters for treatment.  Platelets noted to be 72 and Creatinine 1.56, MD notified.  Message received from Dr. Delton Coombes, patient okay to treat.  Stable during administration without incident; injection site WNL; see MAR for injection details.  Patient tolerated procedure well and without incident.  No questions or complaints noted at this time.

## 2022-12-12 ENCOUNTER — Encounter: Payer: Self-pay | Admitting: Adult Health

## 2022-12-12 ENCOUNTER — Non-Acute Institutional Stay (SKILLED_NURSING_FACILITY): Payer: Medicare PPO | Admitting: Adult Health

## 2022-12-12 DIAGNOSIS — C9000 Multiple myeloma not having achieved remission: Secondary | ICD-10-CM | POA: Diagnosis not present

## 2022-12-12 DIAGNOSIS — I440 Atrioventricular block, first degree: Secondary | ICD-10-CM | POA: Diagnosis not present

## 2022-12-12 DIAGNOSIS — D696 Thrombocytopenia, unspecified: Secondary | ICD-10-CM

## 2022-12-12 DIAGNOSIS — I700 Atherosclerosis of aorta: Secondary | ICD-10-CM

## 2022-12-12 LAB — PROTEIN ELECTROPHORESIS, SERUM
A/G Ratio: 1 (ref 0.7–1.7)
Albumin ELP: 3.1 g/dL (ref 2.9–4.4)
Alpha-1-Globulin: 0.3 g/dL (ref 0.0–0.4)
Alpha-2-Globulin: 0.7 g/dL (ref 0.4–1.0)
Beta Globulin: 0.8 g/dL (ref 0.7–1.3)
Gamma Globulin: 1.4 g/dL (ref 0.4–1.8)
Globulin, Total: 3.2 g/dL (ref 2.2–3.9)
M-Spike, %: 0.6 g/dL — ABNORMAL HIGH
Total Protein ELP: 6.3 g/dL (ref 6.0–8.5)

## 2022-12-12 NOTE — Progress Notes (Signed)
Location:  [facility] Room Number: 148-W Place of Service:  SNF (31)   CODE STATUS: DNR  Allergies  Allergen Reactions   Lotensin [Benazepril]     Chief Complaint  Patient presents with   Medical Management of Chronic Issues                          Thrombocytopenia: Multiple myeloma without achieving remission: Aortic atherosclerosis First degree av block/bradycardia    HPI:  She is a 76 year old long term resident of this facility being seen for the management of her chronic illnesses: Thrombocytopenia: Multiple myeloma without achieving remission: Aortic atherosclerosis First degree av block/bradycardia.  She does get out of bed daily; propels self around the unit. She denies any pain. She denies any insomnia.   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[place])    Dementia ([place])    Diabetes mellitus with CKD    H/O partial nephrectomy    Hypokalemia    Hypothyroidism    Impaired cognition    Multiple myeloma (H[facility])    Dr Maylon Peppers, [facility]    Past Surgical History:  Procedure Laterality Date   ABDOMINAL HYSTERECTOMY     CRANIOTOMY     for lymphoma   YAG LASER APPLICATION Right 01/16/3342   Procedure: YAG LASER APPLICATION;  Surgeon: [name], MD;  Location: [facility];  Service: Ophthalmology;  Laterality: Right;    Social History   Socioeconomic History   Marital status: Single    Spouse name: Not on file   Number of children: Not on file   Years of education: Not on file   Highest education level: Not on file  Occupational History   Occupation: retired   Tobacco Use   Smoking status: Never   Smokeless tobacco: Never  Vaping Use   Vaping Use: Never used  Substance and Sexual Activity   Alcohol use: No   Drug use: No   Sexual activity: Never  Other Topics Concern   Not on file  Social History Narrative   Long term resident of [facility]    Social Determinants of  Health   Financial Resource Strain: [place]  (10/16/2020)   Overall Financial Resource Strain (CARDIA)    Difficulty of Paying Living Expenses: Not very hard  Food Insecurity: No Food Insecurity (10/16/2020)   Hunger Vital Sign    Worried About Running Out of Food in the Last Year: Never true    Ran Out of Food in the Last Year: Never true  Transportation Needs: No Transportation Needs (10/16/2020)   PRAPARE - Hydrologist (Medical): No    Lack of Transportation (Non-Medical): No  Physical Activity: Inactive (10/16/2020)   Exercise Vital Sign    Days of Exercise per Week: 0 days    Minutes of Exercise per Session: 0 min  Stress: No Stress Concern Present (10/16/2020)   [place]    Feeling of Stress : Not at all  Social Connections: Moderately Isolated (10/16/2020)   Social Connection and Isolation Panel [NHANES]    Frequency of Communication with Friends and Family: More than three times a week    Frequency of Social Gatherings with Friends and Family: Once a week    Attends Religious Services: More than 4 times per year  Active Member of Clubs or Organizations: No    Attends Archivist Meetings: Never    Marital Status: Never married  Human resources officer Violence: Not on file   Family History  Problem Relation Age of Onset   Depression Mother    Diabetes Mother    Heart disease Father    Cancer - Prostate Brother    Cancer Brother    Cancer Sister       VITAL SIGNS BP 127/68   Pulse 83   Temp 99.2 F (37.3 C)   Resp 16   Ht _0  (1.753 m)   Wt 125 lb (56.7 kg)   SpO2 97%   BMI 18.46 kg/m   Facility-Administered Encounter Medications as of 12/12/2022  Medication   cyanocobalamin ((VITAMIN B-12)) 1000 MCG/ML injection   cyanocobalamin ((VITAMIN B-12)) 1000 MCG/ML injection   denosumab (XGEVA) 120 MG/1.7ML injection   dexamethasone (DECADRON) 4 MG tablet    dexamethasone (DECADRON) 4 MG tablet   diphenhydrAMINE (BENADRYL) 50 MG/ML injection   diphenhydrAMINE (BENADRYL) 50 MG/ML injection   diphenhydrAMINE (BENADRYL) 50 MG/ML injection   epoetin alfa-epbx (RETACRIT) 00867 UNIT/ML injection   epoetin alfa-epbx (RETACRIT) 61950 UNIT/ML injection   epoetin alfa-epbx (RETACRIT) 93267 UNIT/ML injection   fulvestrant (FASLODEX) 250 MG/5ML injection   lanreotide acetate (SOMATULINE DEPOT) 120 MG/0.5ML injection   lanreotide acetate (SOMATULINE DEPOT) 120 MG/0.5ML injection   lanreotide acetate (SOMATULINE DEPOT) 120 MG/0.5ML injection   lanreotide acetate (SOMATULINE DEPOT) 120 MG/0.5ML injection   lidocaine (PF) (XYLOCAINE) 1 % injection   magnesium sulfate 2 GM/50ML IVPB   octreotide (SANDOSTATIN LAR) 30 MG IM injection   palonosetron (ALOXI) 0.25 MG/5ML injection   palonosetron (ALOXI) 0.25 MG/5ML injection   palonosetron (ALOXI) 0.25 MG/5ML injection   palonosetron (ALOXI) 0.25 MG/5ML injection   potassium chloride 10 MEQ/100ML IVPB   potassium chloride SA (KLOR-CON M) 20 MEQ CR tablet   potassium chloride SA (KLOR-CON M) 20 MEQ CR tablet   prochlorperazine (COMPAZINE) 10 MG tablet   prochlorperazine (COMPAZINE) 10 MG tablet   Outpatient Encounter Medications as of 12/12/2022  Medication Sig   acyclovir (ZOVIRAX) 400 MG tablet TAKE 1 TABLET BY MOUTH TWICE DAILY   alendronate (FOSAMAX) 70 MG tablet Take 70 mg by mouth once a week. Take with a full glass of water on an empty stomach.   aspirin EC 81 MG tablet Take 81 mg by mouth daily.   busPIRone (BUSPAR) 10 MG tablet Take 10 mg by mouth 3 (three) times daily. For anxiety   Calcium Carbonate-Vit D-Min (CALCIUM 600+D PLUS MINERALS) 600-400 MG-UNIT CHEW Chew 1 tablet by mouth daily.   Cholecalciferol (VITAMIN D) 50 MCG (2000 UT) CAPS Take by mouth daily.   cholestyramine (QUESTRAN) 4 g packet Take 4 g by mouth 2 (two) times daily between meals.   LACTOBACILLUS PROBIOTIC PO Take 2 capsules by  mouth daily.   levothyroxine (SYNTHROID, LEVOTHROID) 25 MCG tablet Take 25 mcg by mouth daily before breakfast.   lipase/protease/amylase (CREON) 36000 UNITS CPEP capsule 36,000-114,000- 180,000 unit; amt: 1; oral Special Instructions: Take one capsule with snacks if eaten between meals. Twice A Day Between Meals as needed   lipase/protease/amylase (CREON) 36000 UNITS CPEP capsule Take 3 capsules by mouth 3 (three) times daily with meals.   loperamide (IMODIUM A-D) 2 MG tablet Take 2 mg by mouth 3 (three) times daily. For diarrhea   losartan (COZAAR) 50 MG tablet Take 1 tablet (50 mg total) by mouth daily.   NON  FORMULARY Dysphagia 2 diet with thin liquids   NON FORMULARY Wanderguard #2237 to ankle for safety awareness. Check placement and function qshift. Special Instructions: Check placement and function qshift. Every Shift Day, Evening, Night   potassium chloride SA (KLOR-CON) 20 MEQ tablet Take 20 mEq by mouth daily.    sertraline (ZOLOFT) 50 MG tablet Take 50 mg by mouth daily. (Patient not taking: Reported on 12/12/2022)   [DISCONTINUED] sertraline (ZOLOFT) 25 MG tablet Take 25 mg by mouth daily.     SIGNIFICANT DIAGNOSTIC EXAMS  PREVIOUS   08-10-20: t score: -3.758  03-16-22: chest x-ray:  No radiographic evidence of acute cardiopulmonary disease Mild cardiomegaly  Mild osteopenia Mild osteoarthritis   NO NEW EXAMS     LABS REVIEWED PREVIOUS    12-23-21: wbc 4,1 hgb 11.9; hct 36.8; mcv 98.1 plt 106; glucose 117; bun 20; creat 1.17; k+ 3.4; na++ 142; ca 8.9; GFR 48; liver normal albumin  3.5  mag 2.2  01-06-22: hgb a1c 5.9 03-17-22: wbc 3.6; hgb 10.9; hct 34.7; mcv 97.2 plt 121; glucose 140; bun 20; creat 1.16; k+ 3.6; na++ 144; ca 9.2 GFR 49; protein 6.7; albumin 3.4 ast 69 03-17-22: wbc 4.1; hgb 10.9; hct 32.5; mcv 96.2 plt 98; glucose 96; bbun 22; creat 1.17; k+ 3.5; na++ 142; ca 9.0; GFR 48 d-dimer 1.67; CRP 1.2  03-24-22: d-dimer 0.72 05-13-22: wbc 5.2; hgb 10.7; hct 32.6; mcv  98.5 plt 148; glucose 85; bun 21; creat 1.31; k+ 3.4; na++ 141; ca 9.0; gfr 42; protein 6.5; albumin 3.3  05-15-22: hgb a1c 5.4 05-20-22: wbc 4.5; hgb 11.6; hct 35.6; mcv 97.8 plt 124 06-09-22: wbc 4.7; hgb 10.3; hct 32.0; mcv 97.6 plt 164; glucose 94; bun 23; creat 1.11; k+ 3.8; na++ 141; ca 9.1; gfr 51 protein 6.2 albumin 3.2 tah 3.485 06-25-22: wbc 4.2; hgb 10.7; hct 33.8; mcv 98.3 plt 110; glucose 97; bun 21; creat 1.10; k+ 3.6; na++ 140; ca 8.9; gfr 52; protein 6.4; albumin 3.3 08-06-22: wbc 4.4; hgb 11.0; hct 34.5; mcv 99.4 plt 130; glucose 138; bun 24; creat 1.38; k+ 4.1; na++ 141; ca 8.9; gfr 39; protein 6.5; albumin 3.2; mag 2.3 09-09-22: wbc 5.1; hgb 11.3; hct 34.5; mcv 99.4 plt 132; glucose 119; bun 22; creat 1.29; k+ 4.1; na++ 143 ca 8.8; gfr 42; protein 6.6 albumin 3.2; mag 2.2   10-09-22: wbc 4.3; hgb 11.3; hct 35.0; mcv 101.7 plt 93; glucose 147; bun 21; creat 1.15; k+ 3.6; na++ 146; ca 9.0; gfr 49; protein 6.8; albumin 3.3   NO NEW LABS   Review of Systems  Constitutional:  Negative for malaise/fatigue.  Respiratory:  Negative for cough and shortness of breath.   Cardiovascular:  Negative for chest pain, palpitations and leg swelling.  Gastrointestinal:  Negative for abdominal pain, constipation and heartburn.  Musculoskeletal:  Negative for back pain, joint pain and myalgias.  Skin: Negative.   Neurological:  Negative for dizziness.  Psychiatric/Behavioral:  The patient is not nervous/anxious.    Physical Exam Constitutional:      General: She is not in acute distress.    Appearance: She is well-developed. She is not diaphoretic.  Neck:     Thyroid: No thyromegaly.  Cardiovascular:     Rate and Rhythm: Normal rate and regular rhythm.     Pulses: Normal pulses.     Heart sounds: Normal heart sounds.  Pulmonary:     Effort: Pulmonary effort is normal. No respiratory distress.     Breath sounds: Normal  breath sounds.  Abdominal:     General: Bowel sounds are normal. There is  no distension.     Palpations: Abdomen is soft.     Tenderness: There is no abdominal tenderness.  Musculoskeletal:        General: Normal range of motion.     Cervical back: Neck supple.     Right lower leg: No edema.     Left lower leg: No edema.  Lymphadenopathy:     Cervical: No cervical adenopathy.  Skin:    General: Skin is warm and dry.  Neurological:     Mental Status: She is alert. Mental status is at baseline.  Psychiatric:        Mood and Affect: Mood normal.            ASSESSMENT/ PLAN:  TODAY  Thrombocytopenia: plt 93 will monitor  2. Multiple myeloma without achieving remission: is followed by oncology  3. Aortic atherosclerosis (ct 08-25-14) is on statin and asa   4. First degree av block/bradycardia: seen by cardiology no further interventions.    PREVIOUS    5. Pancreatic insufficiency: will continue questran 4 gm twice daily creon 180,000 units with meals; and imodium 2 mg three times daily   6. Macrocytic anemia: hgb 11.3; vitamin B12: 620 is off iron due to diarrhea   7. Vascular dementia with behavioral disturbance weight is 118 pounds; will continue buspar 10 mg three times daily to help with mood state is off aricept  8. Herpes: no recent outbreak: will continue acyclovir 400 mg twice daily   9. Failure to thrive in adult: weight is 118 pounds will monitor   10. Hypokalemia: k+ 4.1 will continue k+ 20 meq daily   11. Hypothyroidism, unspecified type: tsh 3.485 will continue synthroid 25 mcg daily   12. Hypertension associated with stage 3b chronic kidney disease due to type 2 diabetes mellitus: b/p 127/68 will continue cozaar 50 mg daily asa 81 mg daily   13. Diabetes mellitus type 2 with stage 3 chronic kidney disease and hypertension: hgb a1c 5.4 is on arb and statin   14. Post menopausal osteoporosis: t score -3.758 will continue fosamax 70 mg weekly and supplements       Ok Edwards NP [facility]   call  [phone]

## 2022-12-23 ENCOUNTER — Other Ambulatory Visit: Payer: Self-pay

## 2022-12-23 DIAGNOSIS — C9000 Multiple myeloma not having achieved remission: Secondary | ICD-10-CM

## 2022-12-24 ENCOUNTER — Inpatient Hospital Stay: Payer: Medicare PPO | Attending: Hematology

## 2022-12-24 DIAGNOSIS — Z5112 Encounter for antineoplastic immunotherapy: Secondary | ICD-10-CM | POA: Diagnosis not present

## 2022-12-24 DIAGNOSIS — Z90710 Acquired absence of both cervix and uterus: Secondary | ICD-10-CM | POA: Insufficient documentation

## 2022-12-24 DIAGNOSIS — Z79624 Long term (current) use of inhibitors of nucleotide synthesis: Secondary | ICD-10-CM | POA: Insufficient documentation

## 2022-12-24 DIAGNOSIS — Z8572 Personal history of non-Hodgkin lymphomas: Secondary | ICD-10-CM | POA: Diagnosis not present

## 2022-12-24 DIAGNOSIS — N189 Chronic kidney disease, unspecified: Secondary | ICD-10-CM | POA: Insufficient documentation

## 2022-12-24 DIAGNOSIS — Z7961 Long term (current) use of immunomodulator: Secondary | ICD-10-CM | POA: Insufficient documentation

## 2022-12-24 DIAGNOSIS — E039 Hypothyroidism, unspecified: Secondary | ICD-10-CM | POA: Insufficient documentation

## 2022-12-24 DIAGNOSIS — I129 Hypertensive chronic kidney disease with stage 1 through stage 4 chronic kidney disease, or unspecified chronic kidney disease: Secondary | ICD-10-CM | POA: Insufficient documentation

## 2022-12-24 DIAGNOSIS — E1122 Type 2 diabetes mellitus with diabetic chronic kidney disease: Secondary | ICD-10-CM | POA: Diagnosis not present

## 2022-12-24 DIAGNOSIS — C9000 Multiple myeloma not having achieved remission: Secondary | ICD-10-CM

## 2022-12-24 LAB — CBC WITH DIFFERENTIAL/PLATELET
Abs Immature Granulocytes: 0.01 10*3/uL (ref 0.00–0.07)
Basophils Absolute: 0 10*3/uL (ref 0.0–0.1)
Basophils Relative: 1 %
Eosinophils Absolute: 0.3 10*3/uL (ref 0.0–0.5)
Eosinophils Relative: 5 %
HCT: 36.1 % (ref 36.0–46.0)
Hemoglobin: 11.8 g/dL — ABNORMAL LOW (ref 12.0–15.0)
Immature Granulocytes: 0 %
Lymphocytes Relative: 15 %
Lymphs Abs: 0.7 10*3/uL (ref 0.7–4.0)
MCH: 33.1 pg (ref 26.0–34.0)
MCHC: 32.7 g/dL (ref 30.0–36.0)
MCV: 101.4 fL — ABNORMAL HIGH (ref 80.0–100.0)
Monocytes Absolute: 0.5 10*3/uL (ref 0.1–1.0)
Monocytes Relative: 10 %
Neutro Abs: 3.4 10*3/uL (ref 1.7–7.7)
Neutrophils Relative %: 69 %
Platelets: 154 10*3/uL (ref 150–400)
RBC: 3.56 MIL/uL — ABNORMAL LOW (ref 3.87–5.11)
RDW: 14.6 % (ref 11.5–15.5)
WBC: 4.9 10*3/uL (ref 4.0–10.5)
nRBC: 0 % (ref 0.0–0.2)

## 2022-12-24 LAB — COMPREHENSIVE METABOLIC PANEL
ALT: 17 U/L (ref 0–44)
AST: 31 U/L (ref 15–41)
Albumin: 3.5 g/dL (ref 3.5–5.0)
Alkaline Phosphatase: 68 U/L (ref 38–126)
Anion gap: 8 (ref 5–15)
BUN: 21 mg/dL (ref 8–23)
CO2: 27 mmol/L (ref 22–32)
Calcium: 9.1 mg/dL (ref 8.9–10.3)
Chloride: 106 mmol/L (ref 98–111)
Creatinine, Ser: 1.35 mg/dL — ABNORMAL HIGH (ref 0.44–1.00)
GFR, Estimated: 40 mL/min — ABNORMAL LOW (ref >60)
Glucose, Bld: 95 mg/dL (ref 70–99)
Potassium: 4.1 mmol/L (ref 3.5–5.1)
Sodium: 141 mmol/L (ref 135–145)
Total Bilirubin: 0.4 mg/dL (ref 0.3–1.2)
Total Protein: 6.7 g/dL (ref 6.5–8.1)

## 2022-12-24 LAB — MAGNESIUM: Magnesium: 2.4 mg/dL (ref 1.7–2.4)

## 2022-12-25 ENCOUNTER — Inpatient Hospital Stay (HOSPITAL_BASED_OUTPATIENT_CLINIC_OR_DEPARTMENT_OTHER): Payer: Medicare PPO | Admitting: Hematology

## 2022-12-25 ENCOUNTER — Other Ambulatory Visit: Payer: Medicare PPO

## 2022-12-25 ENCOUNTER — Inpatient Hospital Stay: Payer: Medicare PPO

## 2022-12-25 VITALS — BP 149/82 | HR 80 | Temp 97.7°F | Resp 17 | Ht 69.0 in | Wt 116.6 lb

## 2022-12-25 DIAGNOSIS — N189 Chronic kidney disease, unspecified: Secondary | ICD-10-CM | POA: Diagnosis not present

## 2022-12-25 DIAGNOSIS — C9000 Multiple myeloma not having achieved remission: Secondary | ICD-10-CM | POA: Diagnosis not present

## 2022-12-25 DIAGNOSIS — Z8572 Personal history of non-Hodgkin lymphomas: Secondary | ICD-10-CM | POA: Diagnosis not present

## 2022-12-25 DIAGNOSIS — E1122 Type 2 diabetes mellitus with diabetic chronic kidney disease: Secondary | ICD-10-CM | POA: Diagnosis not present

## 2022-12-25 DIAGNOSIS — E039 Hypothyroidism, unspecified: Secondary | ICD-10-CM | POA: Diagnosis not present

## 2022-12-25 DIAGNOSIS — I129 Hypertensive chronic kidney disease with stage 1 through stage 4 chronic kidney disease, or unspecified chronic kidney disease: Secondary | ICD-10-CM | POA: Diagnosis not present

## 2022-12-25 DIAGNOSIS — Z5112 Encounter for antineoplastic immunotherapy: Secondary | ICD-10-CM | POA: Diagnosis not present

## 2022-12-25 DIAGNOSIS — Z79624 Long term (current) use of inhibitors of nucleotide synthesis: Secondary | ICD-10-CM | POA: Diagnosis not present

## 2022-12-25 DIAGNOSIS — Z7961 Long term (current) use of immunomodulator: Secondary | ICD-10-CM | POA: Diagnosis not present

## 2022-12-25 MED ORDER — BORTEZOMIB CHEMO SQ INJECTION 3.5 MG (2.5MG/ML)
1.3000 mg/m2 | Freq: Once | INTRAMUSCULAR | Status: AC
  Administered 2022-12-25: 2 mg via SUBCUTANEOUS
  Filled 2022-12-25: qty 0.8

## 2022-12-25 MED ORDER — DEXAMETHASONE 4 MG PO TABS
10.0000 mg | ORAL_TABLET | Freq: Once | ORAL | Status: AC
  Administered 2022-12-25: 10 mg via ORAL
  Filled 2022-12-25: qty 3

## 2022-12-25 NOTE — Progress Notes (Signed)
Labs reviewed , ok to treat per MD.    Treatment given per orders. Patient tolerated it well without problems. Vitals stable and discharged home from clinic via wheelchair. Follow up as scheduled.

## 2022-12-25 NOTE — Patient Instructions (Signed)
[facility]  Discharge Instructions: Thank you for choosing [facility] to provide your oncology and hematology care.  If you have a lab appointment with the Cancer Center, please come in thru the Main Entrance and check in at the main information desk.  Wear comfortable clothing and clothing appropriate for easy access to any Portacath or PICC line.   We strive to give you quality time with your provider. You may need to reschedule your appointment if you arrive late (15 or more minutes).  Arriving late affects you and other patients whose appointments are after yours.  Also, if you miss three or more appointments without notifying the office, you may be dismissed from the clinic at the provider's discretion.      For prescription refill requests, have your pharmacy contact our office and allow 72 hours for refills to be completed.    Today you received the following chemotherapy and/or immunotherapy agents velcade   To help prevent nausea and vomiting after your treatment, we encourage you to take your nausea medication as directed.  BELOW ARE SYMPTOMS THAT SHOULD BE REPORTED IMMEDIATELY: *FEVER GREATER THAN 100.4 F (38 C) OR HIGHER *CHILLS OR SWEATING *NAUSEA AND VOMITING THAT IS NOT CONTROLLED WITH YOUR NAUSEA MEDICATION *UNUSUAL SHORTNESS OF BREATH *UNUSUAL BRUISING OR BLEEDING *URINARY PROBLEMS (pain or burning when urinating, or frequent urination) *BOWEL PROBLEMS (unusual diarrhea, constipation, pain near the anus) TENDERNESS IN MOUTH AND THROAT WITH OR WITHOUT PRESENCE OF ULCERS (sore throat, sores in mouth, or a toothache) UNUSUAL RASH, SWELLING OR PAIN  UNUSUAL VAGINAL DISCHARGE OR ITCHING   Items with * indicate a potential emergency and should be followed up as soon as possible or go to the Emergency Department if any problems should occur.  Please show the CHEMOTHERAPY ALERT CARD or IMMUNOTHERAPY ALERT CARD at check-in to the Emergency  Department and triage nurse.  Should you have questions after your visit or need to cancel or reschedule your appointment, please contact [facility] [phone]  and follow the prompts.  Office hours are 8:00 a.m. to 4:30 p.m. Monday - Friday. Please note that voicemails left after 4:00 p.m. may not be returned until the following business day.  We are closed weekends and major holidays. You have access to a nurse at all times for urgent questions. Please call the main number to the clinic [phone] and follow the prompts.  For any non-urgent questions, you may also contact your provider using MyChart. We now offer e-Visits for anyone 18 and older to request care online for non-urgent symptoms. For details visit [email or web address].   Also download the MyChart app! Go to the app store, search "MyChart", open the app, select [place], and log in with your MyChart username and password.   

## 2022-12-25 NOTE — Progress Notes (Signed)
[place] [place], [place] [address]   CLINIC:  Medical Oncology/Hematology  PCP:  Gerlene Fee, NP [address] [place] [address] [phone]   REASON FOR VISIT:  Follow-up for multiple myeloma  PRIOR THERAPY: none  NGS Results: not done  CURRENT THERAPY: Velcade & Decadron 3/4 weeks; Revlimid 2/4 weeks  BRIEF ONCOLOGIC HISTORY:  Oncology History  Multiple myeloma ([place])  07/09/2017 Initial Diagnosis   Multiple myeloma ([place])   06/01/2018 - 08/13/2022 Chemotherapy   Patient is on Treatment Plan : MYELOMA MAINTENANCE Bortezomib SQ D1,8,15 / Dexamethasone Oral D1,8,15 q28d      08/20/2022 -  Chemotherapy   Patient is on Treatment Plan : MYELOMA MAINTENANCE Bortezomib SQ D1,8,15 + Dexamethasone D1,8,15 q21d x 6 cycles       CANCER STAGING:  Cancer Staging  No matching staging information was found for the patient.  INTERVAL HISTORY:  Ms. ARFA LAMARCA, a 77 y.o. female, seen for follow-up and toxicity assessment for myeloma treatments.  She is tolerating Velcade 3 weeks on/1 week off very well.  Energy levels are 80%.  Denies any diarrhea.  No peripheral neuropathy reported.   REVIEW OF SYSTEMS:  Review of Systems  Constitutional:  Negative for appetite change and fatigue.  Neurological:  Negative for numbness.  Psychiatric/Behavioral:  Positive for depression. The patient is nervous/anxious.   All other systems reviewed and are negative.   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[place])    Dementia ([place])    Diabetes mellitus with CKD    H/O partial nephrectomy    Hypokalemia    Hypothyroidism    Impaired cognition    Multiple myeloma ([facility])    Dr Maylon Peppers, [facility]   Past Surgical History:  Procedure Laterality Date   ABDOMINAL HYSTERECTOMY     CRANIOTOMY     for lymphoma   YAG LASER APPLICATION Right 08/01/5630    Procedure: YAG LASER APPLICATION;  Surgeon: [name], MD;  Location: [facility];  Service: Ophthalmology;  Laterality: Right;    SOCIAL HISTORY:  Social History   Socioeconomic History   Marital status: Single    Spouse name: Not on file   Number of children: Not on file   Years of education: Not on file   Highest education level: Not on file  Occupational History   Occupation: retired   Tobacco Use   Smoking status: Never   Smokeless tobacco: Never  Vaping Use   Vaping Use: Never used  Substance and Sexual Activity   Alcohol use: No   Drug use: No   Sexual activity: Never  Other Topics Concern   Not on file  Social History Narrative   Long term resident of [facility]    Social Determinants of Health   Financial Resource Strain: [place]  (10/16/2020)   Overall Financial Resource Strain (CARDIA)    Difficulty of Paying Living Expenses: Not very hard  Food Insecurity: No Food Insecurity (10/16/2020)   Hunger Vital Sign    Worried About Running Out of Food in the Last Year: Never true    Ran Out of Food in the Last Year: Never true  Transportation Needs: No Transportation Needs (10/16/2020)   PRAPARE - Hydrologist (Medical): No    Lack of Transportation (Non-Medical): No  Physical Activity: Inactive (10/16/2020)  Exercise Vital Sign    Days of Exercise per Week: 0 days    Minutes of Exercise per Session: 0 min  Stress: No Stress Concern Present (10/16/2020)   [place]    Feeling of Stress : Not at all  Social Connections: Moderately Isolated (10/16/2020)   Social Connection and Isolation Panel [NHANES]    Frequency of Communication with Friends and Family: More than three times a week    Frequency of Social Gatherings with Friends and Family: Once a week    Attends Religious Services: More than 4 times per year    Active Member of [employer] or Organizations: No    Attends Theatre manager Meetings: Never    Marital Status: Never married  Human resources officer Violence: Not on file    FAMILY HISTORY:  Family History  Problem Relation Age of Onset   Depression Mother    Diabetes Mother    Heart disease Father    Cancer - Prostate Brother    Cancer Brother    Cancer Sister     CURRENT MEDICATIONS:  No current facility-administered medications for this visit.   No current outpatient medications on file.   Facility-Administered Medications Ordered in Other Visits  Medication Dose Route Frequency Provider Last Rate Last Admin   cyanocobalamin ((VITAMIN B-12)) 1000 MCG/ML injection            cyanocobalamin ((VITAMIN B-12)) 1000 MCG/ML injection            denosumab (XGEVA) 120 MG/1.7ML injection            dexamethasone (DECADRON) 4 MG tablet            dexamethasone (DECADRON) 4 MG tablet            diphenhydrAMINE (BENADRYL) 50 MG/ML injection            diphenhydrAMINE (BENADRYL) 50 MG/ML injection            diphenhydrAMINE (BENADRYL) 50 MG/ML injection            epoetin alfa-epbx (RETACRIT) 17408 UNIT/ML injection            epoetin alfa-epbx (RETACRIT) 14481 UNIT/ML injection            epoetin alfa-epbx (RETACRIT) 85631 UNIT/ML injection            fulvestrant (FASLODEX) 250 MG/5ML injection            lanreotide acetate (SOMATULINE DEPOT) 120 MG/0.5ML injection            lanreotide acetate (SOMATULINE DEPOT) 120 MG/0.5ML injection            lanreotide acetate (SOMATULINE DEPOT) 120 MG/0.5ML injection            lanreotide acetate (SOMATULINE DEPOT) 120 MG/0.5ML injection            lidocaine (PF) (XYLOCAINE) 1 % injection            magnesium sulfate 2 GM/50ML IVPB            octreotide (SANDOSTATIN LAR) 30 MG IM injection            palonosetron (ALOXI) 0.25 MG/5ML injection            palonosetron (ALOXI) 0.25 MG/5ML injection            palonosetron (ALOXI) 0.25 MG/5ML injection            palonosetron (ALOXI) 0.25  MG/5ML injection             potassium chloride 10 MEQ/100ML IVPB            potassium chloride SA (KLOR-CON M) 20 MEQ CR tablet            potassium chloride SA (KLOR-CON M) 20 MEQ CR tablet            prochlorperazine (COMPAZINE) 10 MG tablet            prochlorperazine (COMPAZINE) 10 MG tablet             ALLERGIES:  Allergies  Allergen Reactions   Lotensin [Benazepril]     PHYSICAL EXAM:  Performance status (ECOG): 1 - Symptomatic but completely ambulatory  Vitals:   12/25/22 1102  BP: (!) 149/82  Pulse: 80  Resp: 17  Temp: 97.7 F (36.5 C)  SpO2: 100%   Wt Readings from Last 3 Encounters:  12/25/22 116 lb 9.6 oz (52.9 kg)  12/12/22 125 lb (56.7 kg)  12/11/22 119 lb (54 kg)   Physical Exam Vitals reviewed.  Constitutional:      Appearance: Normal appearance.  Cardiovascular:     Rate and Rhythm: Normal rate and regular rhythm.     Pulses: Normal pulses.     Heart sounds: Normal heart sounds.  Pulmonary:     Effort: Pulmonary effort is normal.     Breath sounds: Normal breath sounds.  Neurological:     General: No focal deficit present.     Mental Status: She is alert and oriented to person, place, and time.  Psychiatric:        Mood and Affect: Mood normal.        Behavior: Behavior normal.     LABORATORY DATA:  I have reviewed the labs as listed.     Latest Ref Rng & Units 12/24/2022   11:33 AM 12/10/2022   11:04 AM 12/04/2022   12:10 PM  CBC  WBC 4.0 - 10.5 K/uL 4.9  5.1  4.6   Hemoglobin 12.0 - 15.0 g/dL 11.8  12.4  11.9   Hematocrit 36.0 - 46.0 % 36.1  37.9  36.4   Platelets 150 - 400 K/uL 154  72  100       Latest Ref Rng & Units 12/24/2022   11:33 AM 12/10/2022   11:04 AM 12/04/2022   12:10 PM  CMP  Glucose 70 - 99 mg/dL 95  186  82   BUN 8 - 23 mg/dL '21  29  19   '$ Creatinine 0.44 - 1.00 mg/dL 1.35  1.56  1.16   Sodium 135 - 145 mmol/L 141  143  140   Potassium 3.5 - 5.1 mmol/L 4.1  4.5  4.0   Chloride 98 - 111 mmol/L 106  113  109   CO2 22 - 32 mmol/L '27  24   23   '$ Calcium 8.9 - 10.3 mg/dL 9.1  8.9  8.9   Total Protein 6.5 - 8.1 g/dL 6.7  6.8  7.0   Total Bilirubin 0.3 - 1.2 mg/dL 0.4  0.5  0.6   Alkaline Phos 38 - 126 U/L 68  63  65   AST 15 - 41 U/L '31  28  31   '$ ALT 0 - 44 U/L '17  18  20     '$ DIAGNOSTIC IMAGING:  I have independently reviewed the scans and discussed with the patient. No results found.   ASSESSMENT:  1.  IgG lambda multiple myeloma: -She is on Revlimid 10 mg 2 weeks on/2 weeks of along with Velcade 3 weeks on/1 week off.  Dexamethasone 10 mg on days of Velcade. -Myeloma panel on 04/17/2020 shows M spike 0.5 g.  Kappa light chains are 44.  Lambda light chains are 34.  Ratio is 1.3. -Resident of [facility] since 07/19/2020.   2.  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  3.  Myeloma bone disease: - She has received Zometa for several years in the past which was discontinued.  PLAN:  1.  IgG lambda multiple myeloma: - She is tolerating Velcade 3 weeks on/1 week off with dexamethasone 10 mg very well. - Reviewed myeloma labs from 12/10/2022.  M spike improved to 0.6 g from 0.7 g previously.  Free light chain ratio is normal at 0.8.  Lambda light chains are stable at 28.4. - I plan to continue Velcade 3 weeks on 1 week off at reduced dose on dexamethasone 10 mg on treatment days. - Labs today shows normal LFTs and calcium level.  The CBC was grossly normal. - RTC 3 months for follow-up with repeat myeloma labs.   2.  CKD: - Baseline creatinine stable between 1.1-1.3.   3.  Diarrhea: -She only has intermittent mild diarrhea.  Will closely monitor.   4.  Dementia: - Continue Aricept 10 mg daily.   5.  Shingles prophylaxis: - Continue acyclovir 400 mg twice daily.    Orders placed this encounter:  No orders of the defined types were placed in this encounter.     Derek Jack, MD [place] [phone]

## 2022-12-25 NOTE — Patient Instructions (Addendum)
[place] at [facility] Discharge Instructions   You were seen and examined today by Dr. Delton Coombes.  He reviewed the results of your lab work which are normal/stable.   We will proceed with your injection today.   Return as scheduled.    Thank you for choosing [place] at [facility] to provide your oncology and hematology care.  To afford each patient quality time with our provider, please arrive at least 15 minutes before your scheduled appointment time.   If you have a lab appointment with the [facility] please come in thru the Main Entrance and check in at the main information desk.  You need to re-schedule your appointment should you arrive 10 or more minutes late.  We strive to give you quality time with our providers, and arriving late affects you and other patients whose appointments are after yours.  Also, if you no show three or more times for appointments you may be dismissed from the clinic at the providers discretion.     Again, thank you for choosing [facility].  Our hope is that these requests will decrease the amount of time that you wait before being seen by our physicians.       _____________________________________________________________  Should you have questions after your visit to [facility], please contact our office at [phone] and follow the prompts.  Our office hours are 8:00 a.m. and 4:30 p.m. Monday - Friday.  Please note that voicemails left after 4:00 p.m. may not be returned until the following business day.  We are closed weekends and major holidays.  You do have access to a nurse 24-7, just call the main number to the clinic [phone] and do not press any options, hold on the line and a nurse will answer the phone.    For prescription refill requests, have your pharmacy contact our office and allow 72 hours.    Due to Covid, you will need to wear a mask upon entering  the hospital. If you do not have a mask, a mask will be given to you at the Main Entrance upon arrival. For doctor visits, patients may have 1 support person age 79 or older with them. For treatment visits, patients can not have anyone with them due to social distancing guidelines and our immunocompromised population.

## 2022-12-25 NOTE — Progress Notes (Signed)
Patient has been examined by Dr. Katragadda, and vital signs and labs have been reviewed. ANC, Creatinine, LFTs, hemoglobin, and platelets are within treatment parameters per M.D. - pt may proceed with treatment.  Primary RN and pharmacy notified.  

## 2022-12-26 ENCOUNTER — Other Ambulatory Visit: Payer: Self-pay

## 2022-12-30 ENCOUNTER — Other Ambulatory Visit: Payer: Self-pay | Admitting: *Deleted

## 2022-12-30 ENCOUNTER — Encounter: Payer: Self-pay | Admitting: Adult Health

## 2022-12-30 DIAGNOSIS — C9000 Multiple myeloma not having achieved remission: Secondary | ICD-10-CM

## 2022-12-30 NOTE — Progress Notes (Signed)
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[place])    Dementia ([place])    Diabetes mellitus with CKD    H/O partial nephrectomy    Hypokalemia    Hypothyroidism    Impaired cognition    Multiple myeloma ([facility]CC)    Dr Maylon Peppers, [facility]    Past Surgical History:  Procedure Laterality Date   ABDOMINAL HYSTERECTOMY     CRANIOTOMY     for lymphoma   YAG LASER APPLICATION Right 2/29/7989   Procedure: YAG LASER APPLICATION;  Surgeon: [name], MD;  Location: [facility];  Service: Ophthalmology;  Laterality: Right;    Social History   Socioeconomic History   Marital status: Single    Spouse name: Not on file   Number of children: Not on file   Years of education: Not on file   Highest education level: Not on file  Occupational History   Occupation: retired   Tobacco Use   Smoking status: Never   Smokeless tobacco: Never  Vaping Use   Vaping Use: Never used  Substance and Sexual Activity   Alcohol use: No   Drug use: No   Sexual activity: Never  Other Topics Concern   Not on file  Social History Narrative   Long term resident of [facility]    Social Determinants of Health   Financial Resource Strain: [place]  (10/16/2020)   Overall Financial Resource Strain (CARDIA)    Difficulty of Paying Living Expenses: Not very hard  Food Insecurity: No Food Insecurity (10/16/2020)   Hunger Vital Sign    Worried About Running Out of Food in the Last Year: Never true    Ran Out of Food in the Last Year: Never true  Transportation Needs: No Transportation Needs (10/16/2020)   PRAPARE - Hydrologist (Medical): No    Lack of  Transportation (Non-Medical): No  Physical Activity: Inactive (10/16/2020)   Exercise Vital Sign    Days of Exercise per Week: 0 days    Minutes of Exercise per Session: 0 min  Stress: No Stress Concern Present (10/16/2020)   [place]    Feeling of Stress : Not at all  Social Connections: Moderately Isolated (10/16/2020)   Social Connection and Isolation Panel [NHANES]    Frequency of Communication with Friends and Family: More than three times a week    Frequency of Social Gatherings with Friends and Family: Once a week    Attends Religious Services: More than 4 times per year    Active Member of [employer] or Organizations: No    Attends Archivist Meetings: Never    Marital Status: Never married  Human resources officer Violence: Not on file   Family History  Problem Relation Age of Onset   Depression Mother    Diabetes Mother    Heart disease Father    Cancer - Prostate Brother    Cancer Brother    Cancer Sister       VITAL SIGNS BP 100/63   Pulse 68  Ht '5\' 9"'$  (1.753 m)   Wt 121 lb (54.9 kg)   BMI 17.87 kg/m   Facility-Administered Encounter Medications as of 12/30/2022  Medication   cyanocobalamin ((VITAMIN B-12)) 1000 MCG/ML injection   cyanocobalamin ((VITAMIN B-12)) 1000 MCG/ML injection   denosumab (XGEVA) 120 MG/1.7ML injection   dexamethasone (DECADRON) 4 MG tablet   dexamethasone (DECADRON) 4 MG tablet   diphenhydrAMINE (BENADRYL) 50 MG/ML injection   diphenhydrAMINE (BENADRYL) 50 MG/ML injection   diphenhydrAMINE (BENADRYL) 50 MG/ML injection   epoetin alfa-epbx (RETACRIT) 88502 UNIT/ML injection   epoetin alfa-epbx (RETACRIT) 77412 UNIT/ML injection   epoetin alfa-epbx (RETACRIT) 87867 UNIT/ML injection   fulvestrant (FASLODEX) 250 MG/5ML injection   lanreotide acetate (SOMATULINE DEPOT) 120 MG/0.5ML injection   lanreotide acetate (SOMATULINE DEPOT) 120 MG/0.5ML injection   lanreotide  acetate (SOMATULINE DEPOT) 120 MG/0.5ML injection   lanreotide acetate (SOMATULINE DEPOT) 120 MG/0.5ML injection   lidocaine (PF) (XYLOCAINE) 1 % injection   magnesium sulfate 2 GM/50ML IVPB   octreotide (SANDOSTATIN LAR) 30 MG IM injection   palonosetron (ALOXI) 0.25 MG/5ML injection   palonosetron (ALOXI) 0.25 MG/5ML injection   palonosetron (ALOXI) 0.25 MG/5ML injection   palonosetron (ALOXI) 0.25 MG/5ML injection   potassium chloride 10 MEQ/100ML IVPB   potassium chloride SA (KLOR-CON M) 20 MEQ CR tablet   potassium chloride SA (KLOR-CON M) 20 MEQ CR tablet   prochlorperazine (COMPAZINE) 10 MG tablet   prochlorperazine (COMPAZINE) 10 MG tablet   Outpatient Encounter Medications as of 12/30/2022  Medication Sig   acyclovir (ZOVIRAX) 400 MG tablet TAKE 1 TABLET BY MOUTH TWICE DAILY   alendronate (FOSAMAX) 70 MG tablet Take 70 mg by mouth once a week. Take with a full glass of water on an empty stomach.   aspirin EC 81 MG tablet Take 81 mg by mouth daily.   busPIRone (BUSPAR) 10 MG tablet Take 10 mg by mouth 3 (three) times daily. For anxiety   Calcium Carbonate-Vit D-Min (CALCIUM 600+D PLUS MINERALS) 600-400 MG-UNIT CHEW Chew 1 tablet by mouth daily.   Cholecalciferol (VITAMIN D) 50 MCG (2000 UT) CAPS Take by mouth daily.   cholestyramine (QUESTRAN) 4 g packet Take 4 g by mouth 2 (two) times daily between meals.   LACTOBACILLUS PROBIOTIC PO Take 2 capsules by mouth daily.   levothyroxine (SYNTHROID, LEVOTHROID) 25 MCG tablet Take 25 mcg by mouth daily before breakfast.   lipase/protease/amylase (CREON) 36000 UNITS CPEP capsule 36,000-114,000- 180,000 unit; amt: 1; oral Special Instructions: Take one capsule with snacks if eaten between meals. Twice A Day Between Meals as needed   lipase/protease/amylase (CREON) 36000 UNITS CPEP capsule Take 3 capsules by mouth 3 (three) times daily with meals.   loperamide (IMODIUM A-D) 2 MG tablet Take 2 mg by mouth 3 (three) times daily. For diarrhea    losartan (COZAAR) 50 MG tablet Take 1 tablet (50 mg total) by mouth daily.   NON FORMULARY Dysphagia 2 diet with thin liquids   NON FORMULARY Wanderguard #[id number] to ankle for safety awareness. Check placement and function qshift. Special Instructions: Check placement and function qshift. Every Shift Day, Evening, Night   potassium chloride SA (KLOR-CON) 20 MEQ tablet Take 20 mEq by mouth daily.    sertraline (ZOLOFT) 50 MG tablet Take 50 mg by mouth daily.     SIGNIFICANT DIAGNOSTIC EXAMS       ASSESSMENT/ PLAN:     Ok Edwards NP [facility]  Contact [phone] Monday through Friday 8am- 5pm  After hours call [phone]

## 2022-12-31 ENCOUNTER — Inpatient Hospital Stay: Payer: Medicare PPO

## 2022-12-31 DIAGNOSIS — Z7961 Long term (current) use of immunomodulator: Secondary | ICD-10-CM | POA: Diagnosis not present

## 2022-12-31 DIAGNOSIS — E039 Hypothyroidism, unspecified: Secondary | ICD-10-CM | POA: Diagnosis not present

## 2022-12-31 DIAGNOSIS — Z8572 Personal history of non-Hodgkin lymphomas: Secondary | ICD-10-CM | POA: Diagnosis not present

## 2022-12-31 DIAGNOSIS — N189 Chronic kidney disease, unspecified: Secondary | ICD-10-CM | POA: Diagnosis not present

## 2022-12-31 DIAGNOSIS — Z79624 Long term (current) use of inhibitors of nucleotide synthesis: Secondary | ICD-10-CM | POA: Diagnosis not present

## 2022-12-31 DIAGNOSIS — E1122 Type 2 diabetes mellitus with diabetic chronic kidney disease: Secondary | ICD-10-CM | POA: Diagnosis not present

## 2022-12-31 DIAGNOSIS — C9000 Multiple myeloma not having achieved remission: Secondary | ICD-10-CM

## 2022-12-31 DIAGNOSIS — I129 Hypertensive chronic kidney disease with stage 1 through stage 4 chronic kidney disease, or unspecified chronic kidney disease: Secondary | ICD-10-CM | POA: Diagnosis not present

## 2022-12-31 DIAGNOSIS — Z5112 Encounter for antineoplastic immunotherapy: Secondary | ICD-10-CM | POA: Diagnosis not present

## 2022-12-31 LAB — CBC WITH DIFFERENTIAL/PLATELET
Abs Immature Granulocytes: 0.01 10*3/uL (ref 0.00–0.07)
Basophils Absolute: 0 10*3/uL (ref 0.0–0.1)
Basophils Relative: 0 %
Eosinophils Absolute: 0.1 10*3/uL (ref 0.0–0.5)
Eosinophils Relative: 3 %
HCT: 36.1 % (ref 36.0–46.0)
Hemoglobin: 11.7 g/dL — ABNORMAL LOW (ref 12.0–15.0)
Immature Granulocytes: 0 %
Lymphocytes Relative: 13 %
Lymphs Abs: 0.6 10*3/uL — ABNORMAL LOW (ref 0.7–4.0)
MCH: 32.8 pg (ref 26.0–34.0)
MCHC: 32.4 g/dL (ref 30.0–36.0)
MCV: 101.1 fL — ABNORMAL HIGH (ref 80.0–100.0)
Monocytes Absolute: 0.4 10*3/uL (ref 0.1–1.0)
Monocytes Relative: 8 %
Neutro Abs: 3.5 10*3/uL (ref 1.7–7.7)
Neutrophils Relative %: 76 %
Platelets: 114 10*3/uL — ABNORMAL LOW (ref 150–400)
RBC: 3.57 MIL/uL — ABNORMAL LOW (ref 3.87–5.11)
RDW: 14.6 % (ref 11.5–15.5)
WBC: 4.7 10*3/uL (ref 4.0–10.5)
nRBC: 0 % (ref 0.0–0.2)

## 2022-12-31 LAB — COMPREHENSIVE METABOLIC PANEL
ALT: 23 U/L (ref 0–44)
AST: 34 U/L (ref 15–41)
Albumin: 3.4 g/dL — ABNORMAL LOW (ref 3.5–5.0)
Alkaline Phosphatase: 67 U/L (ref 38–126)
Anion gap: 9 (ref 5–15)
BUN: 23 mg/dL (ref 8–23)
CO2: 24 mmol/L (ref 22–32)
Calcium: 8.9 mg/dL (ref 8.9–10.3)
Chloride: 107 mmol/L (ref 98–111)
Creatinine, Ser: 1.23 mg/dL — ABNORMAL HIGH (ref 0.44–1.00)
GFR, Estimated: 45 mL/min — ABNORMAL LOW (ref >60)
Glucose, Bld: 126 mg/dL — ABNORMAL HIGH (ref 70–99)
Potassium: 3.6 mmol/L (ref 3.5–5.1)
Sodium: 140 mmol/L (ref 135–145)
Total Bilirubin: 0.4 mg/dL (ref 0.3–1.2)
Total Protein: 6.8 g/dL (ref 6.5–8.1)

## 2022-12-31 LAB — MAGNESIUM: Magnesium: 2.3 mg/dL (ref 1.7–2.4)

## 2023-01-01 ENCOUNTER — Inpatient Hospital Stay: Payer: Medicare PPO

## 2023-01-01 VITALS — BP 126/80 | HR 65 | Temp 97.6°F | Resp 18 | Wt 117.5 lb

## 2023-01-01 DIAGNOSIS — Z79624 Long term (current) use of inhibitors of nucleotide synthesis: Secondary | ICD-10-CM | POA: Diagnosis not present

## 2023-01-01 DIAGNOSIS — Z8572 Personal history of non-Hodgkin lymphomas: Secondary | ICD-10-CM | POA: Diagnosis not present

## 2023-01-01 DIAGNOSIS — N189 Chronic kidney disease, unspecified: Secondary | ICD-10-CM | POA: Diagnosis not present

## 2023-01-01 DIAGNOSIS — C9000 Multiple myeloma not having achieved remission: Secondary | ICD-10-CM

## 2023-01-01 DIAGNOSIS — Z7961 Long term (current) use of immunomodulator: Secondary | ICD-10-CM | POA: Diagnosis not present

## 2023-01-01 DIAGNOSIS — I129 Hypertensive chronic kidney disease with stage 1 through stage 4 chronic kidney disease, or unspecified chronic kidney disease: Secondary | ICD-10-CM | POA: Diagnosis not present

## 2023-01-01 DIAGNOSIS — E1122 Type 2 diabetes mellitus with diabetic chronic kidney disease: Secondary | ICD-10-CM | POA: Diagnosis not present

## 2023-01-01 DIAGNOSIS — E039 Hypothyroidism, unspecified: Secondary | ICD-10-CM | POA: Diagnosis not present

## 2023-01-01 DIAGNOSIS — Z5112 Encounter for antineoplastic immunotherapy: Secondary | ICD-10-CM | POA: Diagnosis not present

## 2023-01-01 MED ORDER — BORTEZOMIB CHEMO SQ INJECTION 3.5 MG (2.5MG/ML)
1.3000 mg/m2 | Freq: Once | INTRAMUSCULAR | Status: AC
  Administered 2023-01-01: 2 mg via SUBCUTANEOUS
  Filled 2023-01-01: qty 0.8

## 2023-01-01 MED ORDER — DEXAMETHASONE 4 MG PO TABS
10.0000 mg | ORAL_TABLET | Freq: Once | ORAL | Status: AC
  Administered 2023-01-01: 10 mg via ORAL
  Filled 2023-01-01: qty 3

## 2023-01-01 NOTE — Progress Notes (Signed)
Patient presents today for Velcade injection.  Patient is ins satisfactory condition with no complaints voiced.  Vital signs are stable.  Labs on 12/31/21 reviewed and all labs are witihin treatment parameters.  We will proceed with injection per MD orders.   Patient tolerated treatment well with no complaints voiced.  Patient left via wheelchair with caregiver in stable condition.  Vital signs stable at discharge.  Follow up as scheduled.

## 2023-01-01 NOTE — Patient Instructions (Signed)
[facility]  Discharge Instructions: Thank you for choosing [facility] to provide your oncology and hematology care.  If you have a lab appointment with the [facility], please come in thru the Main Entrance and check in at the main information desk.  Wear comfortable clothing and clothing appropriate for easy access to any Portacath or PICC line.   We strive to give you quality time with your provider. You may need to reschedule your appointment if you arrive late (15 or more minutes).  Arriving late affects you and other patients whose appointments are after yours.  Also, if you miss three or more appointments without notifying the office, you may be dismissed from the clinic at the provider's discretion.      For prescription refill requests, have your pharmacy contact our office and allow 72 hours for refills to be completed.    Today you received the following chemotherapy and/or immunotherapy agents Velcade.  Bortezomib Injection What is this medication? BORTEZOMIB (bor TEZ oh mib) treats lymphoma. It may also be used to treat multiple myeloma, a type of bone marrow cancer. It works by blocking a protein that causes cancer cells to grow and multiply. This helps to slow or stop the spread of cancer cells. This medicine may be used for other purposes; ask your health care provider or pharmacist if you have questions. COMMON BRAND NAME(S): Velcade What should I tell my care team before I take this medication? They need to know if you have any of these conditions: Dehydration Diabetes Heart disease Liver disease Tingling of the fingers or toes or other nerve disorder An unusual or allergic reaction to bortezomib, other medications, foods, dyes, or preservatives If you or your partner are pregnant or trying to get pregnant Breastfeeding How should I use this medication? This medication is injected into a vein or under the skin. It is given by your  care team in a hospital or clinic setting. Talk to your care team about the use of this medication in children. Special care may be needed. Overdosage: If you think you have taken too much of this medicine contact a poison control center or emergency room at once. NOTE: This medicine is only for you. Do not share this medicine with others. What if I miss a dose? Keep appointments for follow-up doses. It is important not to miss your dose. Call your care team if you are unable to keep an appointment. What may interact with this medication? Ketoconazole Rifampin This list may not describe all possible interactions. Give your health care provider a list of all the medicines, herbs, non-prescription drugs, or dietary supplements you use. Also tell them if you smoke, drink alcohol, or use illegal drugs. Some items may interact with your medicine. What should I watch for while using this medication? Your condition will be monitored carefully while you are receiving this medication. You may need blood work while taking this medication. This medication may affect your coordination, reaction time, or judgment. Do not drive or operate machinery until you know how this medication affects you. Sit up or stand slowly to reduce the risk of dizzy or fainting spells. Drinking alcohol with this medication can increase the risk of these side effects. This medication may increase your risk of getting an infection. Call your care team for advice if you get a fever, chills, sore throat, or other symptoms of a cold or flu. Do not treat yourself. Try to avoid being around  people who are sick. Check with your care team if you have severe diarrhea, nausea, and vomiting, or if you sweat a lot. The loss of too much body fluid may make it dangerous for you to take this medication. Talk to your care team if you may be pregnant. Serious birth defects can occur if you take this medication during pregnancy and for 7 months after the  last dose. You will need a negative pregnancy test before starting this medication. Contraception is recommended while taking this medication and for 7 months after the last dose. Your care team can help you find the option that works for you. If your partner can get pregnant, use a condom during sex while taking this medication and for 4 months after the last dose. Do not breastfeed while taking this medication and for 2 months after the last dose. This medication may cause infertility. Talk to your care team if you are concerned about your fertility. What side effects may I notice from receiving this medication? Side effects that you should report to your care team as soon as possible: Allergic reactions--skin rash, itching, hives, swelling of the face, lips, tongue, or throat Bleeding--bloody or black, tar-like stools, vomiting blood or Ether Wolters material that looks like coffee grounds, red or dark Rheanne Cortopassi urine, small red or purple spots on skin, unusual bruising or bleeding Bleeding in the brain--severe headache, stiff neck, confusion, dizziness, change in vision, numbness or weakness of the face, arm, or leg, trouble speaking, trouble walking, vomiting Bowel blockage--stomach cramping, unable to have a bowel movement or pass gas, loss of appetite, vomiting Heart failure--shortness of breath, swelling of the ankles, feet, or hands, sudden weight gain, unusual weakness or fatigue Infection--fever, chills, cough, sore throat, wounds that don't heal, pain or trouble when passing urine, general feeling of discomfort or being unwell Liver injury--right upper belly pain, loss of appetite, nausea, light-colored stool, dark yellow or Paulla Mcclaskey urine, yellowing skin or eyes, unusual weakness or fatigue Low blood pressure--dizziness, feeling faint or lightheaded, blurry vision Lung injury--shortness of breath or trouble breathing, cough, spitting up blood, chest pain, fever Pain, tingling, or numbness in the hands  or feet Severe or prolonged diarrhea Stomach pain, bloody diarrhea, pale skin, unusual weakness or fatigue, decrease in the amount of urine, which may be signs of hemolytic uremic syndrome Sudden and severe headache, confusion, change in vision, seizures, which may be signs of posterior reversible encephalopathy syndrome (PRES) TTP--purple spots on the skin or inside the mouth, pale skin, yellowing skin or eyes, unusual weakness or fatigue, fever, fast or irregular heartbeat, confusion, change in vision, trouble speaking, trouble walking Tumor lysis syndrome (TLS)--nausea, vomiting, diarrhea, decrease in the amount of urine, dark urine, unusual weakness or fatigue, confusion, muscle pain or cramps, fast or irregular heartbeat, joint pain Side effects that usually do not require medical attention (report to your care team if they continue or are bothersome): Constipation Diarrhea Fatigue Loss of appetite Nausea This list may not describe all possible side effects. Call your doctor for medical advice about side effects. You may report side effects to FDA at 1-800-FDA-1088. Where should I keep my medication? This medication is given in a hospital or clinic. It will not be stored at home. NOTE: This sheet is a summary. It may not cover all possible information. If you have questions about this medicine, talk to your doctor, pharmacist, or health care provider.  2023 Elsevier/Gold Standard (2022-04-30 00:00:00)        To help  prevent nausea and vomiting after your treatment, we encourage you to take your nausea medication as directed.  BELOW ARE SYMPTOMS THAT SHOULD BE REPORTED IMMEDIATELY: *FEVER GREATER THAN 100.4 F (38 C) OR HIGHER *CHILLS OR SWEATING *NAUSEA AND VOMITING THAT IS NOT CONTROLLED WITH YOUR NAUSEA MEDICATION *UNUSUAL SHORTNESS OF BREATH *UNUSUAL BRUISING OR BLEEDING *URINARY PROBLEMS (pain or burning when urinating, or frequent urination) *BOWEL PROBLEMS (unusual diarrhea,  constipation, pain near the anus) TENDERNESS IN MOUTH AND THROAT WITH OR WITHOUT PRESENCE OF ULCERS (sore throat, sores in mouth, or a toothache) UNUSUAL RASH, SWELLING OR PAIN  UNUSUAL VAGINAL DISCHARGE OR ITCHING   Items with * indicate a potential emergency and should be followed up as soon as possible or go to the Emergency Department if any problems should occur.  Please show the CHEMOTHERAPY ALERT CARD or IMMUNOTHERAPY ALERT CARD at check-in to the Emergency Department and triage nurse.  Should you have questions after your visit or need to cancel or reschedule your appointment, please contact [facility] [phone]  and follow the prompts.  Office hours are 8:00 a.m. to 4:30 p.m. Monday - Friday. Please note that voicemails left after 4:00 p.m. may not be returned until the following business day.  We are closed weekends and major holidays. You have access to a nurse at all times for urgent questions. Please call the main number to the clinic [phone] and follow the prompts.  For any non-urgent questions, you may also contact your provider using MyChart. We now offer e-Visits for anyone 87 and older to request care online for non-urgent symptoms. For details visit [email or web address].   Also download the MyChart app! Go to the app store, search "MyChart", open the app, select [facility], and log in with your MyChart username and password.

## 2023-01-02 ENCOUNTER — Encounter: Payer: Self-pay | Admitting: Adult Health

## 2023-01-02 ENCOUNTER — Non-Acute Institutional Stay (SKILLED_NURSING_FACILITY): Payer: Medicare PPO | Admitting: Adult Health

## 2023-01-02 DIAGNOSIS — I700 Atherosclerosis of aorta: Secondary | ICD-10-CM | POA: Diagnosis not present

## 2023-01-02 DIAGNOSIS — I129 Hypertensive chronic kidney disease with stage 1 through stage 4 chronic kidney disease, or unspecified chronic kidney disease: Secondary | ICD-10-CM

## 2023-01-02 DIAGNOSIS — N1832 Chronic kidney disease, stage 3b: Secondary | ICD-10-CM

## 2023-01-02 DIAGNOSIS — E1122 Type 2 diabetes mellitus with diabetic chronic kidney disease: Secondary | ICD-10-CM | POA: Diagnosis not present

## 2023-01-02 DIAGNOSIS — F01518 Vascular dementia, unspecified severity, with other behavioral disturbance: Secondary | ICD-10-CM | POA: Diagnosis not present

## 2023-01-02 MED ORDER — OCTREOTIDE ACETATE 30 MG IM KIT
PACK | INTRAMUSCULAR | Status: AC
  Filled 2023-01-02: qty 1

## 2023-01-02 NOTE — Progress Notes (Unsigned)
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[place])    Dementia ([place])    Diabetes mellitus with CKD    H/O partial nephrectomy    Hypokalemia    Hypothyroidism    Impaired cognition    Multiple myeloma ([place])    Dr Maylon Peppers, [facility]    Past Surgical History:  Procedure Laterality Date   ABDOMINAL HYSTERECTOMY     CRANIOTOMY     for lymphoma   YAG LASER APPLICATION Right 02/19/6577   Procedure: YAG LASER APPLICATION;  Surgeon: [name], MD;  Location: [facility];  Service: Ophthalmology;  Laterality: Right;    Social History   Socioeconomic History   Marital status: Single    Spouse name: Not on file   Number of children: Not on file   Years of education: Not on file   Highest education level: Not on file  Occupational History   Occupation: retired   Tobacco Use   Smoking status: Never   Smokeless tobacco: Never  Vaping Use   Vaping Use: Never used  Substance and Sexual Activity   Alcohol use: No   Drug use: No   Sexual activity: Never  Other Topics Concern   Not on file  Social History Narrative   [facility] resident [facility]    Social Determinants of Health   Financial Resource Strain: [place]  (10/16/2020)   Overall Financial Resource Strain (CARDIA)    Difficulty of Paying Living Expenses: Not very hard  Food Insecurity: No Food Insecurity (10/16/2020)   Hunger Vital Sign    Worried About Running Out of Food in the Last Year: Never true    Ran Out of Food in the Last Year: Never true  Transportation Needs: No Transportation Needs (10/16/2020)   PRAPARE - Radiographer, therapeutic (Medical): No    Lack of Transportation (Non-Medical): No  Physical Activity: Inactive (10/16/2020)   Exercise Vital Sign    Days of Exercise per Week: 0 days    Minutes of Exercise per Session: 0 min  Stress: No Stress Concern Present (10/16/2020)   [place]    Feeling of Stress : Not at all  Social Connections: Moderately Isolated (10/16/2020)   Social Connection and Isolation Panel [NHANES]    Frequency of Communication with Friends and Family: More than three times a week    Frequency of Social Gatherings with Friends and Family: Once a week    Attends Religious Services: More than 4 times per year    Active Member of [employer] or Organizations: No    Attends Archivist Meetings: Never    Marital Status: Never married  Human resources officer Violence: Not on file   Family History  Problem Relation Age of Onset   Depression Mother    Diabetes Mother    Heart disease Father    Cancer - Prostate Brother    Cancer Brother    Cancer Sister       VITAL SIGNS BP 100/63  Pulse 68   Temp 98.1 F (36.7 C)   Resp 20   Ht '5\' 9"'$  (1.753 m)   Wt 121 lb 3.2 oz (55 kg)   BMI 17.90 kg/m   Facility-Administered Encounter Medications as of 01/02/2023  Medication   cyanocobalamin ((VITAMIN B-12)) 1000 MCG/ML injection   cyanocobalamin ((VITAMIN B-12)) 1000 MCG/ML injection   denosumab (XGEVA) 120 MG/1.7ML injection   dexamethasone (DECADRON) 4 MG tablet   dexamethasone (DECADRON) 4 MG tablet   diphenhydrAMINE (BENADRYL) 50 MG/ML injection   diphenhydrAMINE (BENADRYL) 50 MG/ML injection   diphenhydrAMINE (BENADRYL) 50 MG/ML injection   epoetin alfa-epbx (RETACRIT) 13086 UNIT/ML injection   epoetin alfa-epbx (RETACRIT) 57846 UNIT/ML injection   epoetin alfa-epbx (RETACRIT) 96295 UNIT/ML injection   fulvestrant (FASLODEX) 250 MG/5ML injection   lanreotide acetate (SOMATULINE DEPOT) 120 MG/0.5ML  injection   lanreotide acetate (SOMATULINE DEPOT) 120 MG/0.5ML injection   lanreotide acetate (SOMATULINE DEPOT) 120 MG/0.5ML injection   lanreotide acetate (SOMATULINE DEPOT) 120 MG/0.5ML injection   lidocaine (PF) (XYLOCAINE) 1 % injection   magnesium sulfate 2 GM/50ML IVPB   octreotide (SANDOSTATIN LAR) 30 MG IM injection   palonosetron (ALOXI) 0.25 MG/5ML injection   palonosetron (ALOXI) 0.25 MG/5ML injection   palonosetron (ALOXI) 0.25 MG/5ML injection   palonosetron (ALOXI) 0.25 MG/5ML injection   potassium chloride 10 MEQ/100ML IVPB   potassium chloride SA (KLOR-CON M) 20 MEQ CR tablet   potassium chloride SA (KLOR-CON M) 20 MEQ CR tablet   prochlorperazine (COMPAZINE) 10 MG tablet   prochlorperazine (COMPAZINE) 10 MG tablet   Outpatient Encounter Medications as of 01/02/2023  Medication Sig   acyclovir (ZOVIRAX) 400 MG tablet TAKE 1 TABLET BY MOUTH TWICE DAILY   alendronate (FOSAMAX) 70 MG tablet Take 70 mg by mouth once a week. Take with a full glass of water on an empty stomach.   aspirin EC 81 MG tablet Take 81 mg by mouth daily.   busPIRone (BUSPAR) 10 MG tablet Take 10 mg by mouth 3 (three) times daily. For anxiety   Calcium Carbonate-Vit D-Min (CALCIUM 600+D PLUS MINERALS) 600-400 MG-UNIT CHEW Chew 1 tablet by mouth daily.   Cholecalciferol (VITAMIN D) 50 MCG (2000 UT) CAPS Take by mouth daily.   cholestyramine (QUESTRAN) 4 g packet Take 4 g by mouth 2 (two) times daily between meals.   LACTOBACILLUS PROBIOTIC PO Take 2 capsules by mouth daily.   levothyroxine (SYNTHROID, LEVOTHROID) 25 MCG tablet Take 25 mcg by mouth daily before breakfast.   lipase/protease/amylase (CREON) 36000 UNITS CPEP capsule 36,000-114,000- 180,000 unit; amt: 1; oral Special Instructions: Take one capsule with snacks if eaten between meals. Twice A Day Between Meals as needed   lipase/protease/amylase (CREON) 36000 UNITS CPEP capsule Take 3 capsules by mouth 3 (three) times daily with meals.    loperamide (IMODIUM A-D) 2 MG tablet Take 2 mg by mouth 3 (three) times daily. For diarrhea   losartan (COZAAR) 50 MG tablet Take 1 tablet (50 mg total) by mouth daily.   NON FORMULARY Dysphagia 2 diet with thin liquids   NON FORMULARY Wanderguard #[id number] to ankle for safety awareness. Check placement and function qshift. Special Instructions: Check placement and function qshift. Every Shift Day, Evening, Night   potassium chloride SA (KLOR-CON) 20 MEQ tablet Take 20 mEq by mouth daily.    sertraline (ZOLOFT) 50 MG tablet Take 50 mg by mouth daily.     SIGNIFICANT DIAGNOSTIC EXAMS       ASSESSMENT/ PLAN:     Ok Edwards NP  [facility] [phone] Monday through Friday 8am- 5pm  After hours call [phone]

## 2023-01-06 ENCOUNTER — Other Ambulatory Visit: Payer: Self-pay

## 2023-01-06 DIAGNOSIS — C9000 Multiple myeloma not having achieved remission: Secondary | ICD-10-CM

## 2023-01-07 ENCOUNTER — Inpatient Hospital Stay: Payer: Medicare PPO

## 2023-01-07 DIAGNOSIS — Z5112 Encounter for antineoplastic immunotherapy: Secondary | ICD-10-CM | POA: Diagnosis not present

## 2023-01-07 DIAGNOSIS — E1122 Type 2 diabetes mellitus with diabetic chronic kidney disease: Secondary | ICD-10-CM | POA: Diagnosis not present

## 2023-01-07 DIAGNOSIS — N189 Chronic kidney disease, unspecified: Secondary | ICD-10-CM | POA: Diagnosis not present

## 2023-01-07 DIAGNOSIS — C9000 Multiple myeloma not having achieved remission: Secondary | ICD-10-CM | POA: Diagnosis not present

## 2023-01-07 DIAGNOSIS — Z8572 Personal history of non-Hodgkin lymphomas: Secondary | ICD-10-CM | POA: Diagnosis not present

## 2023-01-07 DIAGNOSIS — Z7961 Long term (current) use of immunomodulator: Secondary | ICD-10-CM | POA: Diagnosis not present

## 2023-01-07 DIAGNOSIS — I129 Hypertensive chronic kidney disease with stage 1 through stage 4 chronic kidney disease, or unspecified chronic kidney disease: Secondary | ICD-10-CM | POA: Diagnosis not present

## 2023-01-07 DIAGNOSIS — E039 Hypothyroidism, unspecified: Secondary | ICD-10-CM | POA: Diagnosis not present

## 2023-01-07 DIAGNOSIS — Z79624 Long term (current) use of inhibitors of nucleotide synthesis: Secondary | ICD-10-CM | POA: Diagnosis not present

## 2023-01-07 LAB — CBC WITH DIFFERENTIAL/PLATELET
Abs Immature Granulocytes: 0 10*3/uL (ref 0.00–0.07)
Basophils Absolute: 0 10*3/uL (ref 0.0–0.1)
Basophils Relative: 1 %
Eosinophils Absolute: 0.2 10*3/uL (ref 0.0–0.5)
Eosinophils Relative: 4 %
HCT: 36.4 % (ref 36.0–46.0)
Hemoglobin: 11.6 g/dL — ABNORMAL LOW (ref 12.0–15.0)
Immature Granulocytes: 0 %
Lymphocytes Relative: 14 %
Lymphs Abs: 0.6 10*3/uL — ABNORMAL LOW (ref 0.7–4.0)
MCH: 32.2 pg (ref 26.0–34.0)
MCHC: 31.9 g/dL (ref 30.0–36.0)
MCV: 101.1 fL — ABNORMAL HIGH (ref 80.0–100.0)
Monocytes Absolute: 0.4 10*3/uL (ref 0.1–1.0)
Monocytes Relative: 9 %
Neutro Abs: 3 10*3/uL (ref 1.7–7.7)
Neutrophils Relative %: 72 %
Platelets: 89 10*3/uL — ABNORMAL LOW (ref 150–400)
RBC: 3.6 MIL/uL — ABNORMAL LOW (ref 3.87–5.11)
RDW: 14.7 % (ref 11.5–15.5)
WBC: 4.1 10*3/uL (ref 4.0–10.5)
nRBC: 0 % (ref 0.0–0.2)

## 2023-01-07 LAB — COMPREHENSIVE METABOLIC PANEL
ALT: 19 U/L (ref 0–44)
AST: 31 U/L (ref 15–41)
Albumin: 3.3 g/dL — ABNORMAL LOW (ref 3.5–5.0)
Alkaline Phosphatase: 54 U/L (ref 38–126)
Anion gap: 7 (ref 5–15)
BUN: 21 mg/dL (ref 8–23)
CO2: 26 mmol/L (ref 22–32)
Calcium: 8.6 mg/dL — ABNORMAL LOW (ref 8.9–10.3)
Chloride: 108 mmol/L (ref 98–111)
Creatinine, Ser: 1.4 mg/dL — ABNORMAL HIGH (ref 0.44–1.00)
GFR, Estimated: 39 mL/min — ABNORMAL LOW (ref >60)
Glucose, Bld: 141 mg/dL — ABNORMAL HIGH (ref 70–99)
Potassium: 3.9 mmol/L (ref 3.5–5.1)
Sodium: 141 mmol/L (ref 135–145)
Total Bilirubin: 0.6 mg/dL (ref 0.3–1.2)
Total Protein: 6.5 g/dL (ref 6.5–8.1)

## 2023-01-07 LAB — MAGNESIUM: Magnesium: 2.4 mg/dL (ref 1.7–2.4)

## 2023-01-07 NOTE — Progress Notes (Signed)
This encounter was created in error - please disregard.

## 2023-01-08 ENCOUNTER — Inpatient Hospital Stay: Payer: Medicare PPO

## 2023-01-08 VITALS — BP 134/86 | HR 83 | Temp 98.4°F | Resp 18 | Wt 119.2 lb

## 2023-01-08 DIAGNOSIS — E1122 Type 2 diabetes mellitus with diabetic chronic kidney disease: Secondary | ICD-10-CM | POA: Diagnosis not present

## 2023-01-08 DIAGNOSIS — Z5112 Encounter for antineoplastic immunotherapy: Secondary | ICD-10-CM | POA: Diagnosis not present

## 2023-01-08 DIAGNOSIS — Z7961 Long term (current) use of immunomodulator: Secondary | ICD-10-CM | POA: Diagnosis not present

## 2023-01-08 DIAGNOSIS — E039 Hypothyroidism, unspecified: Secondary | ICD-10-CM | POA: Diagnosis not present

## 2023-01-08 DIAGNOSIS — Z8572 Personal history of non-Hodgkin lymphomas: Secondary | ICD-10-CM | POA: Diagnosis not present

## 2023-01-08 DIAGNOSIS — C9000 Multiple myeloma not having achieved remission: Secondary | ICD-10-CM | POA: Diagnosis not present

## 2023-01-08 DIAGNOSIS — I129 Hypertensive chronic kidney disease with stage 1 through stage 4 chronic kidney disease, or unspecified chronic kidney disease: Secondary | ICD-10-CM | POA: Diagnosis not present

## 2023-01-08 DIAGNOSIS — Z79624 Long term (current) use of inhibitors of nucleotide synthesis: Secondary | ICD-10-CM | POA: Diagnosis not present

## 2023-01-08 DIAGNOSIS — N189 Chronic kidney disease, unspecified: Secondary | ICD-10-CM | POA: Diagnosis not present

## 2023-01-08 MED ORDER — BORTEZOMIB CHEMO SQ INJECTION 3.5 MG (2.5MG/ML)
1.3000 mg/m2 | Freq: Once | INTRAMUSCULAR | Status: AC
  Administered 2023-01-08: 2 mg via SUBCUTANEOUS
  Filled 2023-01-08: qty 0.8

## 2023-01-08 MED ORDER — DEXAMETHASONE 4 MG PO TABS
10.0000 mg | ORAL_TABLET | Freq: Once | ORAL | Status: AC
  Administered 2023-01-08: 10 mg via ORAL
  Filled 2023-01-08: qty 3

## 2023-01-08 NOTE — Progress Notes (Signed)
Labs reviewed with MD. Platelets of 89,0000 noted and ok to proceed as planned per MD.  Treatment given per orders. Patient tolerated it well without problems. Vitals stable and discharged home from clinic via wheelchair. Follow up as scheduled.

## 2023-01-09 IMAGING — CT CT HEAD W/O CM
3 series · 15 of 47 positions shown, 18 images · non-contrast
Comparison: Head CT May 03, 2020

CLINICAL DATA: Focal neuro deficit stroke suspected

EXAM:
CT HEAD WITHOUT CONTRAST
TECHNIQUE: Contiguous axial images were obtained from the base of the skull
through the vertex without intravenous contrast.

[Series 2: head w o · axial · 0.41mm/px · z∈[-89,+36]mm · 9 of 31 slices shown, 12 images]
[im 3/31  brain]
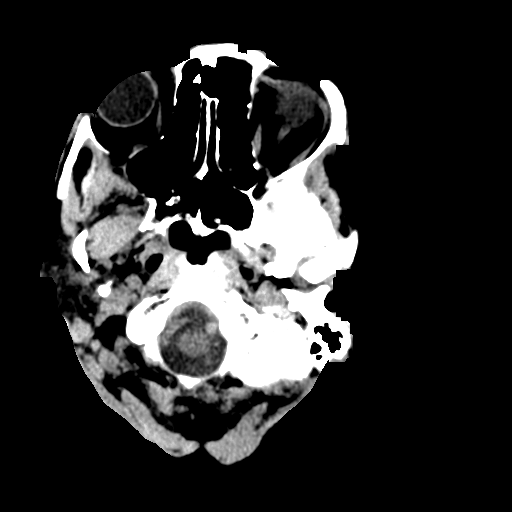
[im 3/31  bone]
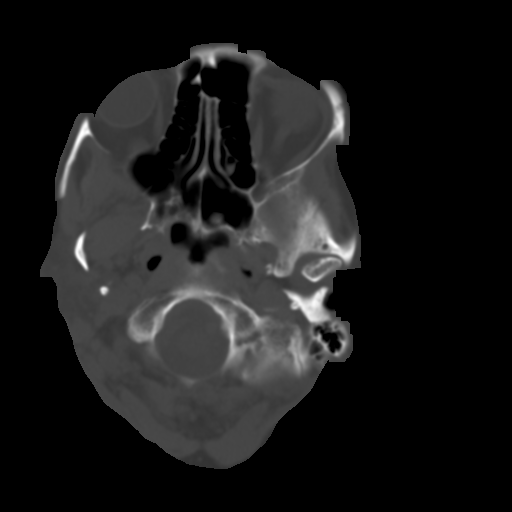
[im 6/31  brain]
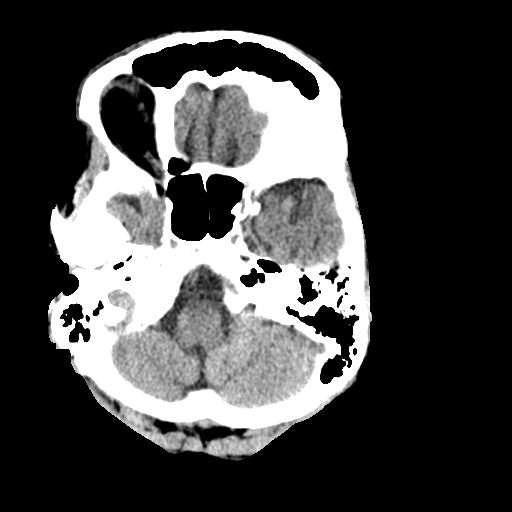
[im 9/31  brain]
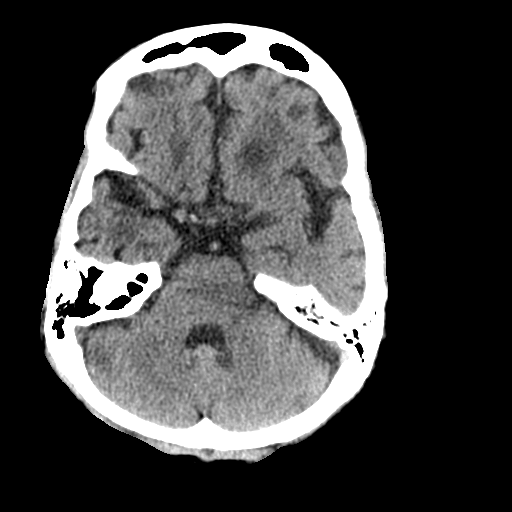
[im 12/31  brain]
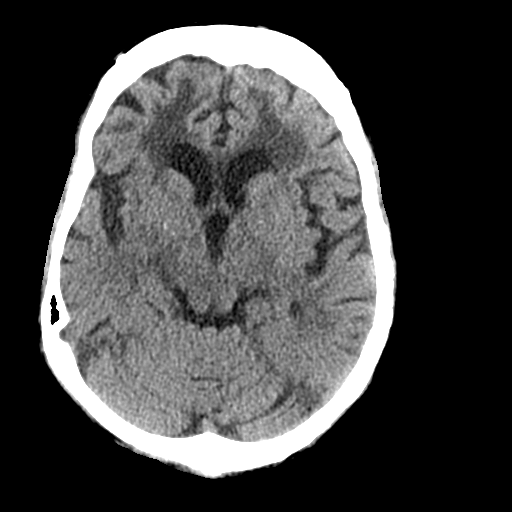
[im 16/31  brain]
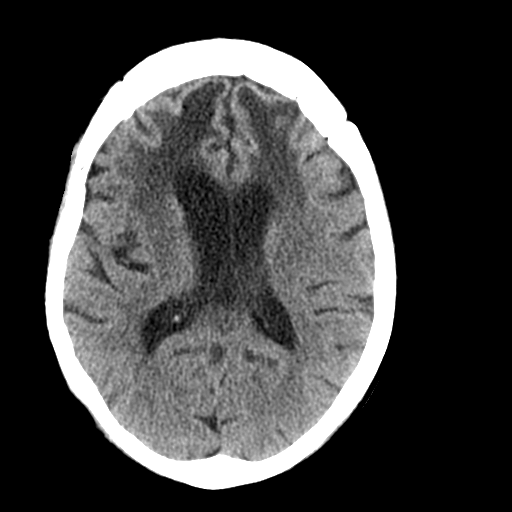
[im 16/31  bone]
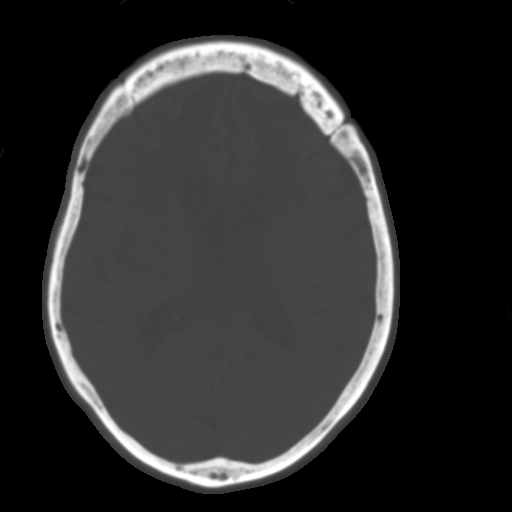
[im 19/31  brain]
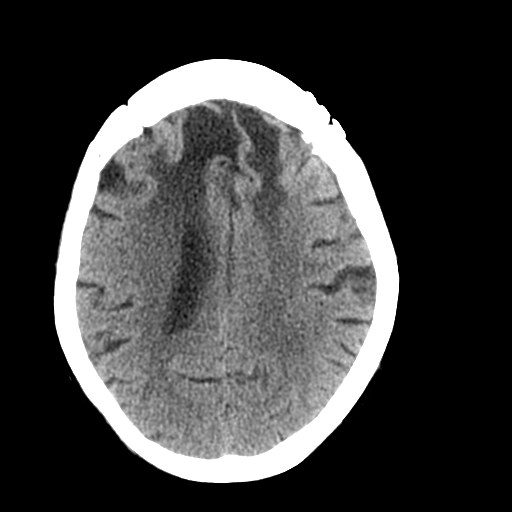
[im 22/31  brain]
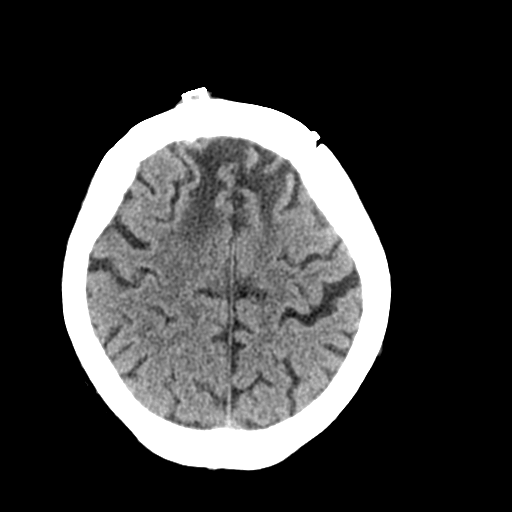
[im 25/31  brain]
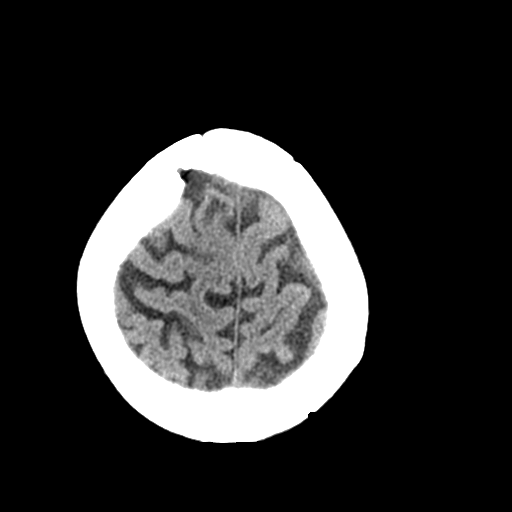
[im 28/31  brain]
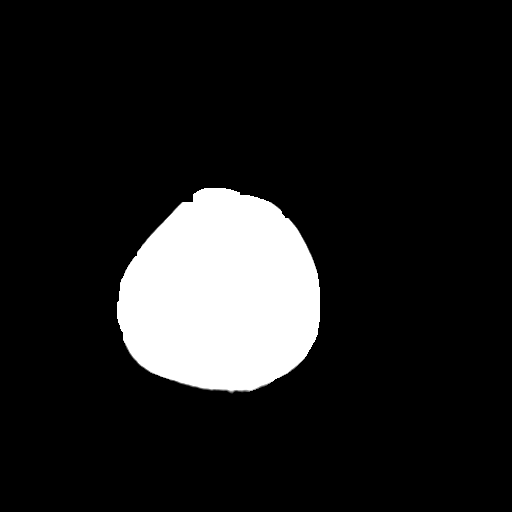
[im 28/31  bone]
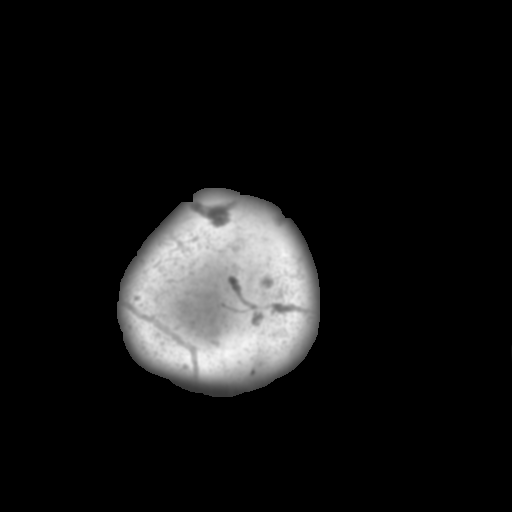

[Series 4: coronal soft · coronal · 0.31mm/px · 3 of 67 slices shown]
[im 23/67  brain]
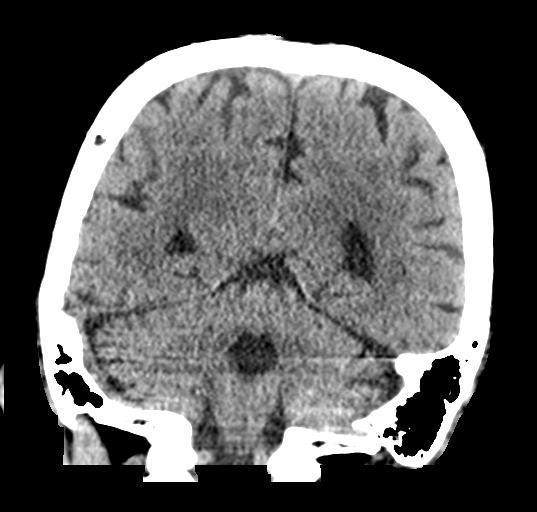
[im 30/67  brain]
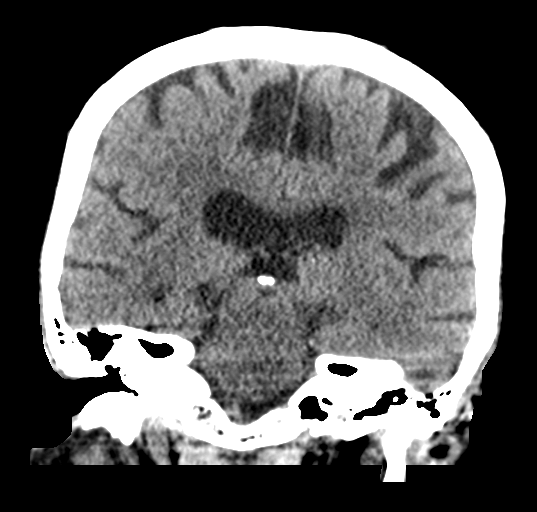
[im 37/67  brain]
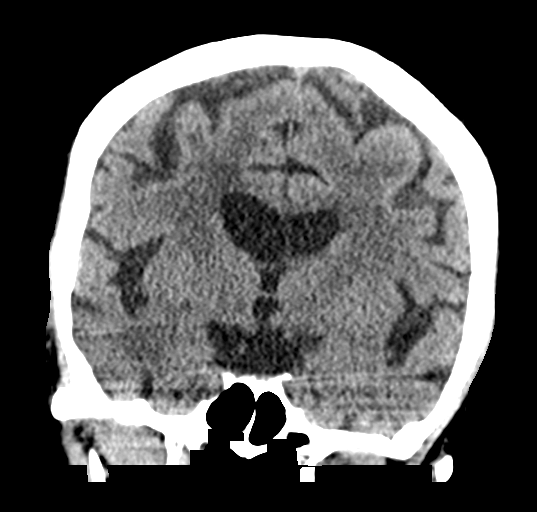

[Series 5: sagittal soft · sagittal · 0.31mm/px · 3 of 54 slices shown]
[im 18/54  brain]
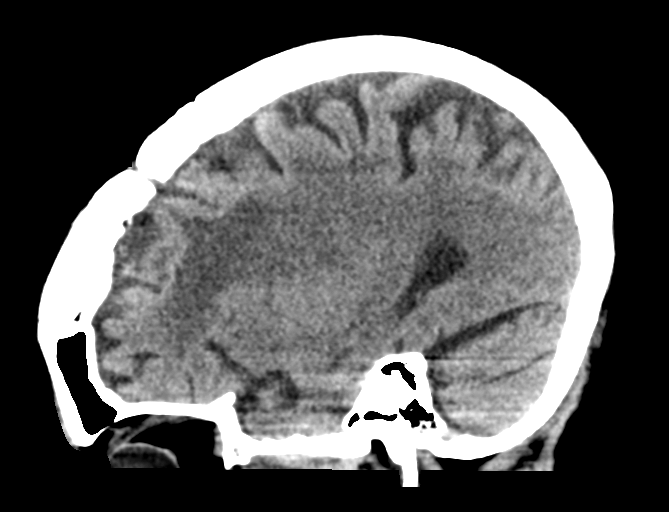
[im 27/54  brain]
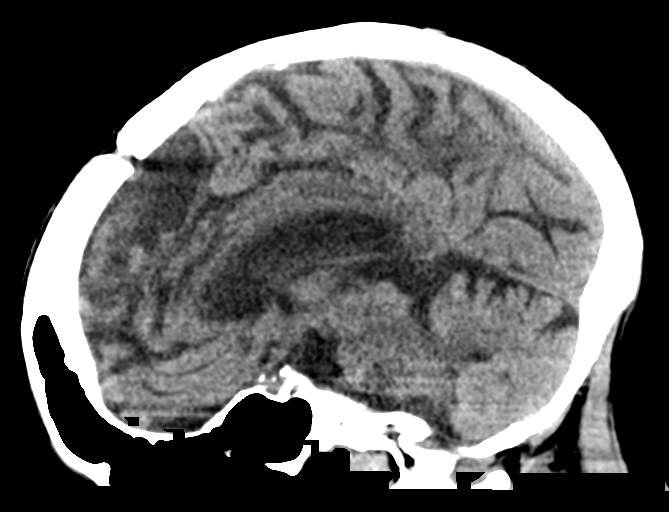
[im 36/54  brain]
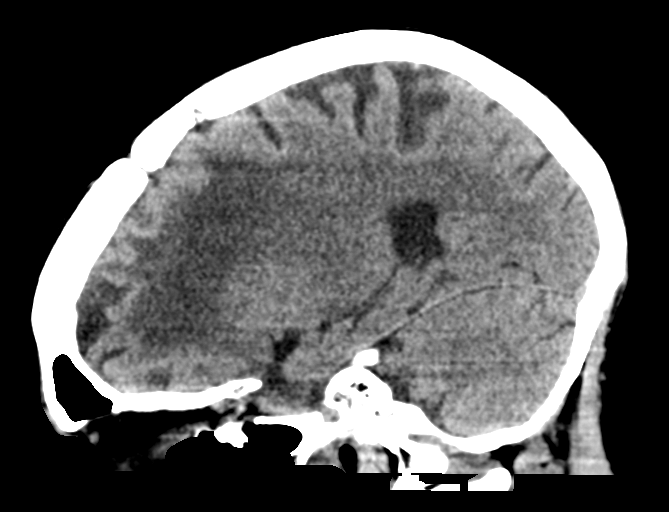

[15 of 47 positions shown; findings below may reference images not displayed]

FINDINGS: Brain: Similar appearance of bifrontal encephalomalacia consistent
with prior infarction/insult. No evidence of acute large vascular
territory infarction. No evidence of acute intracranial hemorrhage
or extra-axial fluid collection.

Vascular: No hyperdense vessel or unexpected calcification.

Skull: Status post left frontal craniotomy. Lucencies are again
noted throughout the calvarium which may be related to history of
multiple myeloma.

Sinuses/Orbits: No acute finding.

Other: None.
IMPRESSION: 1. No acute intracranial abnormality. Please note if concern for
acute infarction MRI would be more sensitive.
2. Similar appearance of bifrontal encephalomalacia consistent with
prior infarction/insult.

## 2023-01-13 ENCOUNTER — Encounter: Payer: Self-pay | Admitting: Adult Health

## 2023-01-13 ENCOUNTER — Non-Acute Institutional Stay (SKILLED_NURSING_FACILITY): Payer: Medicare PPO | Admitting: Adult Health

## 2023-01-13 DIAGNOSIS — F01518 Vascular dementia, unspecified severity, with other behavioral disturbance: Secondary | ICD-10-CM | POA: Diagnosis not present

## 2023-01-13 DIAGNOSIS — K8689 Other specified diseases of pancreas: Secondary | ICD-10-CM

## 2023-01-13 DIAGNOSIS — B009 Herpesviral infection, unspecified: Secondary | ICD-10-CM | POA: Diagnosis not present

## 2023-01-13 DIAGNOSIS — D539 Nutritional anemia, unspecified: Secondary | ICD-10-CM | POA: Diagnosis not present

## 2023-01-13 NOTE — Progress Notes (Unsigned)
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[place])    Dementia ([place])    Diabetes mellitus with CKD    H/O partial nephrectomy    Hypokalemia    Hypothyroidism    Impaired cognition    Multiple myeloma (HCC)    Dr Maylon Peppers, [facility]    Past Surgical History:  Procedure Laterality Date   ABDOMINAL HYSTERECTOMY     CRANIOTOMY     for lymphoma   YAG LASER APPLICATION Right 03/16/271   Procedure: YAG LASER APPLICATION;  Surgeon: [name], MD;  Location: [facility];  Service: Ophthalmology;  Laterality: Right;    Social History   Socioeconomic History   Marital status: Single    Spouse name: Not on file   Number of children: Not on file   Years of education: Not on file   Highest education level: Not on file  Occupational History   Occupation: retired   Tobacco Use   Smoking status: Never   Smokeless tobacco: Never  Vaping Use   Vaping Use: Never used  Substance and Sexual Activity   Alcohol use: No   Drug use: No   Sexual activity: Never  Other Topics Concern   Not on file  Social History Narrative   Long term resident of [facility]    Social Determinants of Health   Financial Resource Strain: [place]  (10/16/2020)   Overall Financial Resource Strain (CARDIA)    Difficulty of Paying Living Expenses: Not very hard  Food Insecurity: No Food Insecurity (10/16/2020)   Hunger Vital Sign    Worried About Running Out of Food in the Last Year: Never true    Ran Out of Food in the Last Year: Never true  Transportation  Needs: No Transportation Needs (10/16/2020)   PRAPARE - Hydrologist (Medical): No    Lack of Transportation (Non-Medical): No  Physical Activity: Inactive (10/16/2020)   Exercise Vital Sign    Days of Exercise per Week: 0 days    Minutes of Exercise per Session: 0 min  Stress: No Stress Concern Present (10/16/2020)   [place]    Feeling of Stress : Not at all  Social Connections: Moderately Isolated (10/16/2020)   Social Connection and Isolation Panel [NHANES]    Frequency of Communication with Friends and Family: More than three times a week    Frequency of Social Gatherings with Friends and Family: Once a week    Attends Religious Services: More than 4 times per year    Active Member of [employer] or Organizations: No    Attends Archivist Meetings: Never    Marital Status: Never married  Human resources officer Violence: Not on file   Family History  Problem Relation Age of Onset   Depression Mother    Diabetes Mother    Heart disease Father    Cancer - Prostate Brother    Cancer  Brother    Cancer Sister       VITAL SIGNS BP 106/68   Pulse 84   Temp 98.3 F (36.8 C)   Resp 18   Ht '5\' 9"'$  (1.753 m)   Wt 121 lb 2 oz (54.9 kg)   SpO2 100%   BMI 17.89 kg/m   Outpatient Encounter Medications as of 01/13/2023  Medication Sig   acyclovir (ZOVIRAX) 400 MG tablet TAKE 1 TABLET BY MOUTH TWICE DAILY   alendronate (FOSAMAX) 70 MG tablet Take 70 mg by mouth once a week. Take with a full glass of water on an empty stomach.   aspirin EC 81 MG tablet Take 81 mg by mouth daily.   busPIRone (BUSPAR) 10 MG tablet Take 20 mg by mouth 3 (three) times daily. For anxiety   Calcium Carbonate-Vit D-Min (CALCIUM 600+D PLUS MINERALS) 600-400 MG-UNIT CHEW Chew 1 tablet by mouth daily.   Cholecalciferol (VITAMIN D) 50 MCG (2000 UT) CAPS Take by mouth daily.   cholestyramine (QUESTRAN) 4 g packet Take 4  g by mouth 2 (two) times daily between meals.   LACTOBACILLUS PROBIOTIC PO Take 2 capsules by mouth daily.   levothyroxine (SYNTHROID, LEVOTHROID) 25 MCG tablet Take 25 mcg by mouth daily before breakfast.   lipase/protease/amylase (CREON) 36000 UNITS CPEP capsule 36,000-114,000- 180,000 unit; amt: 1; oral Special Instructions: Take one capsule with snacks if eaten between meals. Twice A Day Between Meals as needed   lipase/protease/amylase (CREON) 36000 UNITS CPEP capsule Take 3 capsules by mouth 3 (three) times daily with meals.   loperamide (IMODIUM A-D) 2 MG tablet Take 2 mg by mouth 3 (three) times daily. For diarrhea   losartan (COZAAR) 50 MG tablet Take 1 tablet (50 mg total) by mouth daily.   NON FORMULARY Dysphagia 2 diet with thin liquids   NON FORMULARY Wanderguard #[id number] to ankle for safety awareness. Check placement and function qshift. Special Instructions: Check placement and function qshift. Every Shift Day, Evening, Night   potassium chloride SA (KLOR-CON) 20 MEQ tablet Take 20 mEq by mouth daily.    [DISCONTINUED] sertraline (ZOLOFT) 50 MG tablet Take 50 mg by mouth daily.   Facility-Administered Encounter Medications as of 01/13/2023  Medication   cyanocobalamin ((VITAMIN B-12)) 1000 MCG/ML injection   cyanocobalamin ((VITAMIN B-12)) 1000 MCG/ML injection   denosumab (XGEVA) 120 MG/1.7ML injection   dexamethasone (DECADRON) 4 MG tablet   dexamethasone (DECADRON) 4 MG tablet   diphenhydrAMINE (BENADRYL) 50 MG/ML injection   diphenhydrAMINE (BENADRYL) 50 MG/ML injection   diphenhydrAMINE (BENADRYL) 50 MG/ML injection   epoetin alfa-epbx (RETACRIT) 34196 UNIT/ML injection   epoetin alfa-epbx (RETACRIT) 22297 UNIT/ML injection   epoetin alfa-epbx (RETACRIT) 98921 UNIT/ML injection   fulvestrant (FASLODEX) 250 MG/5ML injection   lanreotide acetate (SOMATULINE DEPOT) 120 MG/0.5ML injection   lanreotide acetate (SOMATULINE DEPOT) 120 MG/0.5ML injection   lanreotide acetate  (SOMATULINE DEPOT) 120 MG/0.5ML injection   lanreotide acetate (SOMATULINE DEPOT) 120 MG/0.5ML injection   lidocaine (PF) (XYLOCAINE) 1 % injection   magnesium sulfate 2 GM/50ML IVPB   octreotide (SANDOSTATIN LAR) 30 MG IM injection   octreotide (SANDOSTATIN LAR) 30 MG IM injection   palonosetron (ALOXI) 0.25 MG/5ML injection   palonosetron (ALOXI) 0.25 MG/5ML injection   palonosetron (ALOXI) 0.25 MG/5ML injection   palonosetron (ALOXI) 0.25 MG/5ML injection   potassium chloride 10 MEQ/100ML IVPB   potassium chloride SA (KLOR-CON M) 20 MEQ CR tablet   potassium chloride SA (KLOR-CON M) 20 MEQ CR tablet   prochlorperazine (COMPAZINE)  10 MG tablet   prochlorperazine (COMPAZINE) 10 MG tablet     SIGNIFICANT DIAGNOSTIC EXAMS       ASSESSMENT/ PLAN:     Ok Edwards NP [facility]  Contact [phone] Monday through Friday 8am- 5pm  After hours call [phone]

## 2023-01-19 ENCOUNTER — Other Ambulatory Visit (HOSPITAL_COMMUNITY)
Admission: RE | Admit: 2023-01-19 | Discharge: 2023-01-19 | Disposition: A | Discharge status: Home / Self Care | Payer: Medicare PPO | Source: Skilled Nursing Facility | Attending: Adult Health | Admitting: Adult Health

## 2023-01-19 DIAGNOSIS — E1159 Type 2 diabetes mellitus with other circulatory complications: Secondary | ICD-10-CM | POA: Diagnosis not present

## 2023-01-19 LAB — HEMOGLOBIN A1C
Hgb A1c MFr Bld: 5.6 % (ref 4.8–5.6)
Mean Plasma Glucose: 114.02 mg/dL

## 2023-01-20 ENCOUNTER — Other Ambulatory Visit: Payer: Self-pay

## 2023-01-20 DIAGNOSIS — C9000 Multiple myeloma not having achieved remission: Secondary | ICD-10-CM

## 2023-01-21 ENCOUNTER — Inpatient Hospital Stay: Payer: Medicare PPO | Attending: Hematology

## 2023-01-21 DIAGNOSIS — C9000 Multiple myeloma not having achieved remission: Secondary | ICD-10-CM | POA: Diagnosis not present

## 2023-01-21 DIAGNOSIS — Z5112 Encounter for antineoplastic immunotherapy: Secondary | ICD-10-CM | POA: Diagnosis not present

## 2023-01-21 LAB — CBC WITH DIFFERENTIAL/PLATELET
Abs Immature Granulocytes: 0.01 10*3/uL (ref 0.00–0.07)
Basophils Absolute: 0 10*3/uL (ref 0.0–0.1)
Basophils Relative: 1 %
Eosinophils Absolute: 0.2 10*3/uL (ref 0.0–0.5)
Eosinophils Relative: 4 %
HCT: 35.5 % — ABNORMAL LOW (ref 36.0–46.0)
Hemoglobin: 11.7 g/dL — ABNORMAL LOW (ref 12.0–15.0)
Immature Granulocytes: 0 %
Lymphocytes Relative: 14 %
Lymphs Abs: 0.6 10*3/uL — ABNORMAL LOW (ref 0.7–4.0)
MCH: 33.5 pg (ref 26.0–34.0)
MCHC: 33 g/dL (ref 30.0–36.0)
MCV: 101.7 fL — ABNORMAL HIGH (ref 80.0–100.0)
Monocytes Absolute: 0.5 10*3/uL (ref 0.1–1.0)
Monocytes Relative: 12 %
Neutro Abs: 3.1 10*3/uL (ref 1.7–7.7)
Neutrophils Relative %: 69 %
Platelets: 132 10*3/uL — ABNORMAL LOW (ref 150–400)
RBC: 3.49 MIL/uL — ABNORMAL LOW (ref 3.87–5.11)
RDW: 14.9 % (ref 11.5–15.5)
WBC: 4.5 10*3/uL (ref 4.0–10.5)
nRBC: 0 % (ref 0.0–0.2)

## 2023-01-21 LAB — COMPREHENSIVE METABOLIC PANEL
ALT: 20 U/L (ref 0–44)
AST: 31 U/L (ref 15–41)
Albumin: 3.4 g/dL — ABNORMAL LOW (ref 3.5–5.0)
Alkaline Phosphatase: 61 U/L (ref 38–126)
Anion gap: 8 (ref 5–15)
BUN: 28 mg/dL — ABNORMAL HIGH (ref 8–23)
CO2: 23 mmol/L (ref 22–32)
Calcium: 8.8 mg/dL — ABNORMAL LOW (ref 8.9–10.3)
Chloride: 109 mmol/L (ref 98–111)
Creatinine, Ser: 1.59 mg/dL — ABNORMAL HIGH (ref 0.44–1.00)
GFR, Estimated: 33 mL/min — ABNORMAL LOW (ref >60)
Glucose, Bld: 110 mg/dL — ABNORMAL HIGH (ref 70–99)
Potassium: 4.1 mmol/L (ref 3.5–5.1)
Sodium: 140 mmol/L (ref 135–145)
Total Bilirubin: 0.5 mg/dL (ref 0.3–1.2)
Total Protein: 6.7 g/dL (ref 6.5–8.1)

## 2023-01-21 LAB — MICROALBUMIN / CREATININE URINE RATIO
Creatinine, Urine: 75.4 mg/dL
Microalb Creat Ratio: 6 mg/g creat (ref 0–29)
Microalb, Ur: 4.9 ug/mL — ABNORMAL HIGH

## 2023-01-21 LAB — MAGNESIUM: Magnesium: 2.5 mg/dL — ABNORMAL HIGH (ref 1.7–2.4)

## 2023-01-22 ENCOUNTER — Inpatient Hospital Stay: Payer: Medicare PPO

## 2023-01-22 VITALS — BP 143/84 | HR 83 | Temp 97.3°F | Resp 18

## 2023-01-22 DIAGNOSIS — C9000 Multiple myeloma not having achieved remission: Secondary | ICD-10-CM | POA: Diagnosis not present

## 2023-01-22 DIAGNOSIS — Z5112 Encounter for antineoplastic immunotherapy: Secondary | ICD-10-CM | POA: Diagnosis not present

## 2023-01-22 MED ORDER — DEXAMETHASONE 4 MG PO TABS
10.0000 mg | ORAL_TABLET | Freq: Once | ORAL | Status: AC
  Administered 2023-01-22: 10 mg via ORAL
  Filled 2023-01-22: qty 3

## 2023-01-22 MED ORDER — BORTEZOMIB CHEMO SQ INJECTION 3.5 MG (2.5MG/ML)
1.3000 mg/m2 | Freq: Once | INTRAMUSCULAR | Status: AC
  Administered 2023-01-22: 2 mg via SUBCUTANEOUS
  Filled 2023-01-22: qty 0.8

## 2023-01-22 NOTE — Progress Notes (Signed)
Pt presents today for Velcade per provider's order. Vital signs and other labs WNL for treatment. Pt's creatinine is 1.59. Dr.K made aware and stated okay to proceed with treatment today.  Velcade given today per MD orders. Tolerated infusion without adverse affects. Vital signs stable. No complaints at this time. Discharged from clinic via wheelchair in stable condition. Alert and oriented x 3. F/U with [facility] as scheduled.

## 2023-01-28 ENCOUNTER — Inpatient Hospital Stay: Payer: Medicare PPO

## 2023-01-28 DIAGNOSIS — C9000 Multiple myeloma not having achieved remission: Secondary | ICD-10-CM | POA: Diagnosis not present

## 2023-01-28 DIAGNOSIS — Z5112 Encounter for antineoplastic immunotherapy: Secondary | ICD-10-CM | POA: Diagnosis not present

## 2023-01-28 LAB — COMPREHENSIVE METABOLIC PANEL
ALT: 25 U/L (ref 0–44)
AST: 35 U/L (ref 15–41)
Albumin: 3.7 g/dL (ref 3.5–5.0)
Alkaline Phosphatase: 59 U/L (ref 38–126)
Anion gap: 12 (ref 5–15)
BUN: 21 mg/dL (ref 8–23)
CO2: 25 mmol/L (ref 22–32)
Calcium: 8.9 mg/dL (ref 8.9–10.3)
Chloride: 104 mmol/L (ref 98–111)
Creatinine, Ser: 1.34 mg/dL — ABNORMAL HIGH (ref 0.44–1.00)
GFR, Estimated: 41 mL/min — ABNORMAL LOW (ref >60)
Glucose, Bld: 165 mg/dL — ABNORMAL HIGH (ref 70–99)
Potassium: 3.6 mmol/L (ref 3.5–5.1)
Sodium: 141 mmol/L (ref 135–145)
Total Bilirubin: 0.7 mg/dL (ref 0.3–1.2)
Total Protein: 6.8 g/dL (ref 6.5–8.1)

## 2023-01-28 LAB — CBC WITH DIFFERENTIAL/PLATELET
Abs Immature Granulocytes: 0.01 10*3/uL (ref 0.00–0.07)
Basophils Absolute: 0 10*3/uL (ref 0.0–0.1)
Basophils Relative: 1 %
Eosinophils Absolute: 0.1 10*3/uL (ref 0.0–0.5)
Eosinophils Relative: 3 %
HCT: 37.1 % (ref 36.0–46.0)
Hemoglobin: 12.2 g/dL (ref 12.0–15.0)
Immature Granulocytes: 0 %
Lymphocytes Relative: 8 %
Lymphs Abs: 0.4 10*3/uL — ABNORMAL LOW (ref 0.7–4.0)
MCH: 32.6 pg (ref 26.0–34.0)
MCHC: 32.9 g/dL (ref 30.0–36.0)
MCV: 99.2 fL (ref 80.0–100.0)
Monocytes Absolute: 0.3 10*3/uL (ref 0.1–1.0)
Monocytes Relative: 6 %
Neutro Abs: 4.3 10*3/uL (ref 1.7–7.7)
Neutrophils Relative %: 82 %
Platelets: 90 10*3/uL — ABNORMAL LOW (ref 150–400)
RBC: 3.74 MIL/uL — ABNORMAL LOW (ref 3.87–5.11)
RDW: 14.7 % (ref 11.5–15.5)
WBC: 5.2 10*3/uL (ref 4.0–10.5)
nRBC: 0 % (ref 0.0–0.2)

## 2023-01-28 LAB — MAGNESIUM: Magnesium: 2.4 mg/dL (ref 1.7–2.4)

## 2023-01-29 ENCOUNTER — Inpatient Hospital Stay: Payer: Medicare PPO

## 2023-01-29 VITALS — BP 157/90 | HR 95 | Temp 98.2°F | Resp 18 | Wt 121.4 lb

## 2023-01-29 DIAGNOSIS — C9000 Multiple myeloma not having achieved remission: Secondary | ICD-10-CM | POA: Diagnosis not present

## 2023-01-29 DIAGNOSIS — Z5112 Encounter for antineoplastic immunotherapy: Secondary | ICD-10-CM | POA: Diagnosis not present

## 2023-01-29 MED ORDER — DEXAMETHASONE 4 MG PO TABS
10.0000 mg | ORAL_TABLET | Freq: Once | ORAL | Status: AC
  Administered 2023-01-29: 10 mg via ORAL
  Filled 2023-01-29: qty 3

## 2023-01-29 MED ORDER — BORTEZOMIB CHEMO SQ INJECTION 3.5 MG (2.5MG/ML)
1.3000 mg/m2 | Freq: Once | INTRAMUSCULAR | Status: AC
  Administered 2023-01-29: 2 mg via SUBCUTANEOUS
  Filled 2023-01-29: qty 0.8

## 2023-01-29 NOTE — Patient Instructions (Signed)
[facility]  Discharge Instructions: Thank you for choosing [facility] to provide your oncology and hematology care.  If you have a lab appointment with the [facility], please come in thru the Main Entrance and check in at the main information desk.  Wear comfortable clothing and clothing appropriate for easy access to any Portacath or PICC line.   We strive to give you quality time with your provider. You may need to reschedule your appointment if you arrive late (15 or more minutes).  Arriving late affects you and other patients whose appointments are after yours.  Also, if you miss three or more appointments without notifying the office, you may be dismissed from the clinic at the provider's discretion.      For prescription refill requests, have your pharmacy contact our office and allow 72 hours for refills to be completed.    Today you received the following chemotherapy and/or immunotherapy agents Velcade.  Bortezomib Injection What is this medication? BORTEZOMIB (bor TEZ oh mib) treats lymphoma. It may also be used to treat multiple myeloma, a type of bone marrow cancer. It works by blocking a protein that causes cancer cells to grow and multiply. This helps to slow or stop the spread of cancer cells. This medicine may be used for other purposes; ask your health care provider or pharmacist if you have questions. COMMON BRAND NAME(S): Velcade What should I tell my care team before I take this medication? They need to know if you have any of these conditions: Dehydration Diabetes Heart disease Liver disease Tingling of the fingers or toes or other nerve disorder An unusual or allergic reaction to bortezomib, other medications, foods, dyes, or preservatives If you or your partner are pregnant or trying to get pregnant Breastfeeding How should I use this medication? This medication is injected into a vein or under the skin. It is given by your  care team in a hospital or clinic setting. Talk to your care team about the use of this medication in children. Special care may be needed. Overdosage: If you think you have taken too much of this medicine contact a poison control center or emergency room at once. NOTE: This medicine is only for you. Do not share this medicine with others. What if I miss a dose? Keep appointments for follow-up doses. It is important not to miss your dose. Call your care team if you are unable to keep an appointment. What may interact with this medication? Ketoconazole Rifampin This list may not describe all possible interactions. Give your health care provider a list of all the medicines, herbs, non-prescription drugs, or dietary supplements you use. Also tell them if you smoke, drink alcohol, or use illegal drugs. Some items may interact with your medicine. What should I watch for while using this medication? Your condition will be monitored carefully while you are receiving this medication. You may need blood work while taking this medication. This medication may affect your coordination, reaction time, or judgment. Do not drive or operate machinery until you know how this medication affects you. Sit up or stand slowly to reduce the risk of dizzy or fainting spells. Drinking alcohol with this medication can increase the risk of these side effects. This medication may increase your risk of getting an infection. Call your care team for advice if you get a fever, chills, sore throat, or other symptoms of a cold or flu. Do not treat yourself. Try to avoid being around  people who are sick. Check with your care team if you have severe diarrhea, nausea, and vomiting, or if you sweat a lot. The loss of too much body fluid may make it dangerous for you to take this medication. Talk to your care team if you may be pregnant. Serious birth defects can occur if you take this medication during pregnancy and for 7 months after the  last dose. You will need a negative pregnancy test before starting this medication. Contraception is recommended while taking this medication and for 7 months after the last dose. Your care team can help you find the option that works for you. If your partner can get pregnant, use a condom during sex while taking this medication and for 4 months after the last dose. Do not breastfeed while taking this medication and for 2 months after the last dose. This medication may cause infertility. Talk to your care team if you are concerned about your fertility. What side effects may I notice from receiving this medication? Side effects that you should report to your care team as soon as possible: Allergic reactions--skin rash, itching, hives, swelling of the face, lips, tongue, or throat Bleeding--bloody or black, tar-like stools, vomiting blood or [place] material that looks like coffee grounds, red or dark [place] urine, small red or purple spots on skin, unusual bruising or bleeding Bleeding in the brain--severe headache, stiff neck, confusion, dizziness, change in vision, numbness or weakness of the face, arm, or leg, trouble speaking, trouble walking, vomiting Bowel blockage--stomach cramping, unable to have a bowel movement or pass gas, loss of appetite, vomiting Heart failure--shortness of breath, swelling of the ankles, feet, or hands, sudden weight gain, unusual weakness or fatigue Infection--fever, chills, cough, sore throat, wounds that don't heal, pain or trouble when passing urine, general feeling of discomfort or being unwell Liver injury--right upper belly pain, loss of appetite, nausea, light-colored stool, dark yellow or Staley [place] urine, yellowing skin or eyes, unusual weakness or fatigue Low blood pressure--dizziness, feeling faint or lightheaded, blurry vision Lung injury--shortness of breath or trouble breathing, cough, spitting up blood, chest pain, fever Pain, tingling, or numbness in the hands  or feet Severe or prolonged diarrhea Stomach pain, bloody diarrhea, pale skin, unusual weakness or fatigue, decrease in the amount of urine, which may be signs of hemolytic uremic syndrome Sudden and severe headache, confusion, change in vision, seizures, which may be signs of posterior reversible encephalopathy syndrome (PRES) TTP--purple spots on the skin or inside the mouth, pale skin, yellowing skin or eyes, unusual weakness or fatigue, fever, fast or irregular heartbeat, confusion, change in vision, trouble speaking, trouble walking Tumor lysis syndrome (TLS)--nausea, vomiting, diarrhea, decrease in the amount of urine, dark urine, unusual weakness or fatigue, confusion, muscle pain or cramps, fast or irregular heartbeat, joint pain Side effects that usually do not require medical attention (report to your care team if they continue or are bothersome): Constipation Diarrhea Fatigue Loss of appetite Nausea This list may not describe all possible side effects. Call your doctor for medical advice about side effects. You may report side effects to FDA at 1-800-FDA-1088. Where should I keep my medication? This medication is given in a hospital or clinic. It will not be stored at home. NOTE: This sheet is a summary. It may not cover all possible information. If you have questions about this medicine, talk to your doctor, pharmacist, or health care provider.  2023 Elsevier/Gold Standard (2022-04-30 00:00:00)        To help  prevent nausea and vomiting after your treatment, we encourage you to take your nausea medication as directed.  BELOW ARE SYMPTOMS THAT SHOULD BE REPORTED IMMEDIATELY: *FEVER GREATER THAN 100.4 F (38 C) OR HIGHER *CHILLS OR SWEATING *NAUSEA AND VOMITING THAT IS NOT CONTROLLED WITH YOUR NAUSEA MEDICATION *UNUSUAL SHORTNESS OF BREATH *UNUSUAL BRUISING OR BLEEDING *URINARY PROBLEMS (pain or burning when urinating, or frequent urination) *BOWEL PROBLEMS (unusual diarrhea,  constipation, pain near the anus) TENDERNESS IN MOUTH AND THROAT WITH OR WITHOUT PRESENCE OF ULCERS (sore throat, sores in mouth, or a toothache) UNUSUAL RASH, SWELLING OR PAIN  UNUSUAL VAGINAL DISCHARGE OR ITCHING   Items with * indicate a potential emergency and should be followed up as soon as possible or go to the Emergency Department if any problems should occur.  Please show the CHEMOTHERAPY ALERT CARD or IMMUNOTHERAPY ALERT CARD at check-in to the Emergency Department and triage nurse.  Should you have questions after your visit or need to cancel or reschedule your appointment, please contact [facility] [phone]  and follow the prompts.  Office hours are 8:00 a.m. to 4:30 p.m. Monday - Friday. Please note that voicemails left after 4:00 p.m. may not be returned until the following business day.  We are closed weekends and major holidays. You have access to a nurse at all times for urgent questions. Please call the main number to the clinic [phone] and follow the prompts.  For any non-urgent questions, you may also contact your provider using MyChart. We now offer e-Visits for anyone 33 and older to request care online for non-urgent symptoms. For details visit [email or web address].   Also download the MyChart app! Go to the app store, search "MyChart", open the app, select [facility], and log in with your MyChart username and password.

## 2023-01-29 NOTE — Progress Notes (Signed)
Patient presents today for Velcade injection.  Patient is in satisfactory condition with no new complaints voiced.  Vital signs are stable.  Labs reviewed and all labs are within treatment parameters.  We will proceed with injection per MD orders.   Patient tolerated Velcade injection with no complaints voiced.  Site clean and dry with no bruising or swelling noted.  No complaints of pain.  Discharged with vital signs stable and no signs or symptoms of distress noted.

## 2023-02-03 ENCOUNTER — Non-Acute Institutional Stay (SKILLED_NURSING_FACILITY): Payer: Medicare PPO | Admitting: Internal Medicine

## 2023-02-03 ENCOUNTER — Encounter: Payer: Self-pay | Admitting: Internal Medicine

## 2023-02-03 DIAGNOSIS — N1832 Chronic kidney disease, stage 3b: Secondary | ICD-10-CM | POA: Diagnosis not present

## 2023-02-03 DIAGNOSIS — E1122 Type 2 diabetes mellitus with diabetic chronic kidney disease: Secondary | ICD-10-CM | POA: Diagnosis not present

## 2023-02-03 DIAGNOSIS — E039 Hypothyroidism, unspecified: Secondary | ICD-10-CM | POA: Diagnosis not present

## 2023-02-03 DIAGNOSIS — F01518 Vascular dementia, unspecified severity, with other behavioral disturbance: Secondary | ICD-10-CM | POA: Diagnosis not present

## 2023-02-03 DIAGNOSIS — D539 Nutritional anemia, unspecified: Secondary | ICD-10-CM

## 2023-02-03 DIAGNOSIS — I129 Hypertensive chronic kidney disease with stage 1 through stage 4 chronic kidney disease, or unspecified chronic kidney disease: Secondary | ICD-10-CM

## 2023-02-03 NOTE — Assessment & Plan Note (Signed)
She confabulates nonsensically.  No behavioral issues reported.

## 2023-02-03 NOTE — Assessment & Plan Note (Addendum)
Macrocytic anemia has resolved.  Most recent H/H was 12.2/37.1 with an MCV of 99.2, down from a prior value of 101.7.  B12 level was last checked on 04/12/2021 and was therapeutic at 393. Recheck B12 level if macrocytosis recurs.

## 2023-02-03 NOTE — Patient Instructions (Signed)
See assessment and plan under each diagnosis in the problem list and acutely for this visit 

## 2023-02-03 NOTE — Assessment & Plan Note (Signed)
TSH should be updated in June of this year.  Serially it has been stable and therapeutic on the present low L-thyroxine dose of 25 mcg.

## 2023-02-03 NOTE — Progress Notes (Signed)
NURSING HOME LOCATION:  [facility] ROOM NUMBER:  [id number]  CODE STATUS:  DNR  PCP:  Ok Edwards NP  This is a nursing facility follow up visit of chronic medical diagnoses & to document compliance with Regulation 483.30 (c) in The [place] Manual Phase 2 which mandates caregiver visit ( visits can alternate among physician, PA or NP as per statutes) within 10 days of 30 days / 60 days/ 90 days post admission to SNF date    Interim medical record and care since last SNF visit was updated with review of diagnostic studies and change in clinical status since last visit were documented.  HPI: She is a permanent resident of this facility with diagnoses of essential hypertension, CNS lymphoma, diabetes with CKD, multiple myeloma, hypothyroidism, and dementia. She receives infusions for her multiple myeloma, most recently 01/29/2023.  Most recent labs were performed 01/28/2023 and revealed a creatinine 1.34 and GFR 41 indicating CKD stage IIIb.  Albumin has risen risen to 3.7 from a prior value of 3.3.  Prior stable macrocytic anemia has resolved with most recent H/H of 12.2/37.1 with MCV of 99.2, down from 101.7.  The most recent B12 level on record was 393 on 04/12/2021.  It has ranged from that value up to a high of 620. Epic glucose values have ranged from a low of 75 up to a high of 240.  The most recent A1c was 5.6% on 01/19/2023, prediabetes.  Serially this value is stable. The most recent TSH on record was 3.485 on 06/09/2022; she is on L-thyroxine.  Serially the TSH  has been therapeutic and relatively stable.   Review of systems: Dementia invalidated responses.  She stated that she was "doing fine.".  She confabulated about "sometimes see somebody sometimes."  She describes "daily belly pain sharp/burning" in the left upper quadrant.  The remainder of the GI review of systems was negative.  Her answer to almost all review of systems queries was "none whatsoever."   She does validate dysuria but then pointed at the snack crackers on her bedside bureau. She went on to confabulate about "only when I get something from somebody and have to ask their opinion."  Constitutional: No fever, significant weight change, fatigue  Eyes: No redness, discharge, pain, vision change ENT/mouth: No nasal congestion,  purulent discharge, earache, change in hearing, sore throat  Cardiovascular: No chest pain, palpitations, paroxysmal nocturnal dyspnea, claudication, edema  Respiratory: No cough, sputum production, hemoptysis, DOE, significant snoring, apnea   Gastrointestinal: No heartburn, dysphagia, nausea /vomiting, rectal bleeding, melena, change in bowels Genitourinary: No hematuria, pyuria, incontinence, nocturia Musculoskeletal: No joint stiffness, joint swelling, weakness, pain Dermatologic: No rash, pruritus, change in appearance of skin Neurologic: No dizziness, headache, syncope, seizures, numbness, tingling Psychiatric: No significant anxiety, depression, insomnia, anorexia Endocrine: No change in hair/skin/nails, excessive thirst, excessive hunger, excessive urination  Hematologic/lymphatic: No significant bruising, lymphadenopathy, abnormal bleeding Allergy/immunology: No itchy/watery eyes, significant sneezing, urticaria, angioedema  Physical exam:  Pertinent or positive findings: She is thin.  Globes are prominent.  Arcus senilis is present.  She has an upper plate.  She is missing mandibular teeth.  Rhythm is slightly irregular.  There is slight increase in S2.  Dorsalis pedis pulses are stronger than posterior tibial pulses.  She has trace edema at the sock line.  Interosseous wasting of the hands is noted.  Limb atrophy is also present.  General appearance:  no acute distress, increased work of breathing is  present.   Lymphatic: No lymphadenopathy about the head, neck, axilla. Eyes: No conjunctival inflammation or lid edema is present. There is no scleral  icterus. Ears:  External ear exam shows no significant lesions or deformities.   Nose:  External nasal examination shows no deformity or inflammation. Nasal mucosa are pink and moist without lesions, exudates Neck:  No thyromegaly, masses, tenderness noted.    Heart:  No gallop, murmur, click, rub .  Lungs: Chest clear to auscultation without wheezes, rhonchi, rales, rubs. Abdomen: Bowel sounds are normal. Abdomen is soft and nontender with no organomegaly, hernias, masses. GU: Deferred  Extremities:  No cyanosis, clubbing Neurologic exam :Balance, [place], finger to nose testing could not be completed due to clinical state Skin: Warm & dry w/o tenting. No significant lesions or rash.  See summary under each active problem in the Problem List with associated updated therapeutic plan

## 2023-02-03 NOTE — Assessment & Plan Note (Signed)
BP controlled; no change in antihypertensive medications.  It is interesting that she is on ARB; her sister has a history of angioedema with ACE inhibitors.

## 2023-02-04 ENCOUNTER — Inpatient Hospital Stay: Payer: Medicare PPO

## 2023-02-04 DIAGNOSIS — C9000 Multiple myeloma not having achieved remission: Secondary | ICD-10-CM

## 2023-02-04 DIAGNOSIS — Z5112 Encounter for antineoplastic immunotherapy: Secondary | ICD-10-CM | POA: Diagnosis not present

## 2023-02-04 LAB — CBC WITH DIFFERENTIAL/PLATELET
Abs Immature Granulocytes: 0.01 10*3/uL (ref 0.00–0.07)
Basophils Absolute: 0 10*3/uL (ref 0.0–0.1)
Basophils Relative: 0 %
Eosinophils Absolute: 0.1 10*3/uL (ref 0.0–0.5)
Eosinophils Relative: 3 %
HCT: 36.6 % (ref 36.0–46.0)
Hemoglobin: 12.1 g/dL (ref 12.0–15.0)
Immature Granulocytes: 0 %
Lymphocytes Relative: 12 %
Lymphs Abs: 0.6 10*3/uL — ABNORMAL LOW (ref 0.7–4.0)
MCH: 33 pg (ref 26.0–34.0)
MCHC: 33.1 g/dL (ref 30.0–36.0)
MCV: 99.7 fL (ref 80.0–100.0)
Monocytes Absolute: 0.4 10*3/uL (ref 0.1–1.0)
Monocytes Relative: 7 %
Neutro Abs: 4 10*3/uL (ref 1.7–7.7)
Neutrophils Relative %: 78 %
Platelets: 80 10*3/uL — ABNORMAL LOW (ref 150–400)
RBC: 3.67 MIL/uL — ABNORMAL LOW (ref 3.87–5.11)
RDW: 14.7 % (ref 11.5–15.5)
WBC: 5 10*3/uL (ref 4.0–10.5)
nRBC: 0 % (ref 0.0–0.2)

## 2023-02-04 LAB — COMPREHENSIVE METABOLIC PANEL
ALT: 32 U/L (ref 0–44)
AST: 40 U/L (ref 15–41)
Albumin: 3.5 g/dL (ref 3.5–5.0)
Alkaline Phosphatase: 63 U/L (ref 38–126)
Anion gap: 8 (ref 5–15)
BUN: 22 mg/dL (ref 8–23)
CO2: 26 mmol/L (ref 22–32)
Calcium: 8.5 mg/dL — ABNORMAL LOW (ref 8.9–10.3)
Chloride: 106 mmol/L (ref 98–111)
Creatinine, Ser: 1.22 mg/dL — ABNORMAL HIGH (ref 0.44–1.00)
GFR, Estimated: 45 mL/min — ABNORMAL LOW (ref >60)
Glucose, Bld: 151 mg/dL — ABNORMAL HIGH (ref 70–99)
Potassium: 3.9 mmol/L (ref 3.5–5.1)
Sodium: 140 mmol/L (ref 135–145)
Total Bilirubin: 0.5 mg/dL (ref 0.3–1.2)
Total Protein: 6.7 g/dL (ref 6.5–8.1)

## 2023-02-04 LAB — MAGNESIUM: Magnesium: 2.2 mg/dL (ref 1.7–2.4)

## 2023-02-05 ENCOUNTER — Inpatient Hospital Stay: Payer: Medicare PPO

## 2023-02-05 VITALS — BP 135/81 | HR 72 | Temp 97.1°F | Resp 18 | Wt 121.0 lb

## 2023-02-05 DIAGNOSIS — Z5112 Encounter for antineoplastic immunotherapy: Secondary | ICD-10-CM | POA: Diagnosis not present

## 2023-02-05 DIAGNOSIS — C9000 Multiple myeloma not having achieved remission: Secondary | ICD-10-CM | POA: Diagnosis not present

## 2023-02-05 MED ORDER — DEXAMETHASONE 4 MG PO TABS
10.0000 mg | ORAL_TABLET | Freq: Once | ORAL | Status: AC
  Administered 2023-02-05: 10 mg via ORAL
  Filled 2023-02-05: qty 3

## 2023-02-05 MED ORDER — BORTEZOMIB CHEMO SQ INJECTION 3.5 MG (2.5MG/ML)
1.3000 mg/m2 | Freq: Once | INTRAMUSCULAR | Status: AC
  Administered 2023-02-05: 2 mg via SUBCUTANEOUS
  Filled 2023-02-05: qty 0.8

## 2023-02-05 NOTE — Progress Notes (Signed)
Treatment given per orders. Patient tolerated it well without problems. Vitals stable and discharged home from clinic via wheelchair Follow up as scheduled.  

## 2023-02-05 NOTE — Patient Instructions (Signed)
[facility]  Discharge Instructions: Thank you for choosing [facility] to provide your oncology and hematology care.  If you have a lab appointment with the [facility], please come in thru the Main Entrance and check in at the main information desk.  Wear comfortable clothing and clothing appropriate for easy access to any Portacath or PICC line.   We strive to give you quality time with your provider. You may need to reschedule your appointment if you arrive late (15 or more minutes).  Arriving late affects you and other patients whose appointments are after yours.  Also, if you miss three or more appointments without notifying the office, you may be dismissed from the clinic at the provider's discretion.      For prescription refill requests, have your pharmacy contact our office and allow 72 hours for refills to be completed.    Today you received the following chemotherapy and/or immunotherapy agents velcade   To help prevent nausea and vomiting after your treatment, we encourage you to take your nausea medication as directed.  BELOW ARE SYMPTOMS THAT SHOULD BE REPORTED IMMEDIATELY: *FEVER GREATER THAN 100.4 F (38 C) OR HIGHER *CHILLS OR SWEATING *NAUSEA AND VOMITING THAT IS NOT CONTROLLED WITH YOUR NAUSEA MEDICATION *UNUSUAL SHORTNESS OF BREATH *UNUSUAL BRUISING OR BLEEDING *URINARY PROBLEMS (pain or burning when urinating, or frequent urination) *BOWEL PROBLEMS (unusual diarrhea, constipation, pain near the anus) TENDERNESS IN MOUTH AND THROAT WITH OR WITHOUT PRESENCE OF ULCERS (sore throat, sores in mouth, or a toothache) UNUSUAL RASH, SWELLING OR PAIN  UNUSUAL VAGINAL DISCHARGE OR ITCHING   Items with * indicate a potential emergency and should be followed up as soon as possible or go to the Emergency Department if any problems should occur.  Please show the CHEMOTHERAPY ALERT CARD or IMMUNOTHERAPY ALERT CARD at check-in to the Emergency  Department and triage nurse.  Should you have questions after your visit or need to cancel or reschedule your appointment, please contact [facility] [phone]  and follow the prompts.  Office hours are 8:00 a.m. to 4:30 p.m. Monday - Friday. Please note that voicemails left after 4:00 p.m. may not be returned until the following business day.  We are closed weekends and major holidays. You have access to a nurse at all times for urgent questions. Please call the main number to the clinic [phone] and follow the prompts.  For any non-urgent questions, you may also contact your provider using MyChart. We now offer e-Visits for anyone 71 and older to request care online for non-urgent symptoms. For details visit [email or web address].   Also download the MyChart app! Go to the app store, search "MyChart", open the app, select [facility], and log in with your MyChart username and password.

## 2023-02-16 DIAGNOSIS — B351 Tinea unguium: Secondary | ICD-10-CM | POA: Diagnosis not present

## 2023-02-16 DIAGNOSIS — Z794 Long term (current) use of insulin: Secondary | ICD-10-CM | POA: Diagnosis not present

## 2023-02-16 DIAGNOSIS — E1140 Type 2 diabetes mellitus with diabetic neuropathy, unspecified: Secondary | ICD-10-CM | POA: Diagnosis not present

## 2023-02-16 DIAGNOSIS — M2041 Other hammer toe(s) (acquired), right foot: Secondary | ICD-10-CM | POA: Diagnosis not present

## 2023-02-18 ENCOUNTER — Other Ambulatory Visit: Payer: Self-pay

## 2023-02-18 ENCOUNTER — Inpatient Hospital Stay: Payer: Medicare PPO | Attending: Hematology

## 2023-02-18 DIAGNOSIS — C9000 Multiple myeloma not having achieved remission: Secondary | ICD-10-CM

## 2023-02-18 DIAGNOSIS — Z5112 Encounter for antineoplastic immunotherapy: Secondary | ICD-10-CM | POA: Insufficient documentation

## 2023-02-18 LAB — CBC WITH DIFFERENTIAL/PLATELET
Abs Immature Granulocytes: 0.01 10*3/uL (ref 0.00–0.07)
Basophils Absolute: 0 10*3/uL (ref 0.0–0.1)
Basophils Relative: 1 %
Eosinophils Absolute: 0.1 10*3/uL (ref 0.0–0.5)
Eosinophils Relative: 3 %
HCT: 35 % — ABNORMAL LOW (ref 36.0–46.0)
Hemoglobin: 11.6 g/dL — ABNORMAL LOW (ref 12.0–15.0)
Immature Granulocytes: 0 %
Lymphocytes Relative: 12 %
Lymphs Abs: 0.5 10*3/uL — ABNORMAL LOW (ref 0.7–4.0)
MCH: 33 pg (ref 26.0–34.0)
MCHC: 33.1 g/dL (ref 30.0–36.0)
MCV: 99.4 fL (ref 80.0–100.0)
Monocytes Absolute: 0.3 10*3/uL (ref 0.1–1.0)
Monocytes Relative: 7 %
Neutro Abs: 3.4 10*3/uL (ref 1.7–7.7)
Neutrophils Relative %: 77 %
Platelets: 125 10*3/uL — ABNORMAL LOW (ref 150–400)
RBC: 3.52 MIL/uL — ABNORMAL LOW (ref 3.87–5.11)
RDW: 14.9 % (ref 11.5–15.5)
WBC: 4.4 10*3/uL (ref 4.0–10.5)
nRBC: 0 % (ref 0.0–0.2)

## 2023-02-18 LAB — COMPREHENSIVE METABOLIC PANEL
ALT: 20 U/L (ref 0–44)
AST: 33 U/L (ref 15–41)
Albumin: 3.5 g/dL (ref 3.5–5.0)
Alkaline Phosphatase: 60 U/L (ref 38–126)
Anion gap: 10 (ref 5–15)
BUN: 21 mg/dL (ref 8–23)
CO2: 25 mmol/L (ref 22–32)
Calcium: 8.8 mg/dL — ABNORMAL LOW (ref 8.9–10.3)
Chloride: 105 mmol/L (ref 98–111)
Creatinine, Ser: 1.3 mg/dL — ABNORMAL HIGH (ref 0.44–1.00)
GFR, Estimated: 42 mL/min — ABNORMAL LOW (ref >60)
Glucose, Bld: 173 mg/dL — ABNORMAL HIGH (ref 70–99)
Potassium: 3.7 mmol/L (ref 3.5–5.1)
Sodium: 140 mmol/L (ref 135–145)
Total Bilirubin: 0.9 mg/dL (ref 0.3–1.2)
Total Protein: 6.9 g/dL (ref 6.5–8.1)

## 2023-02-18 LAB — MAGNESIUM: Magnesium: 2.3 mg/dL (ref 1.7–2.4)

## 2023-02-19 ENCOUNTER — Inpatient Hospital Stay: Payer: Medicare PPO

## 2023-02-19 VITALS — BP 96/77 | HR 98 | Temp 99.3°F | Resp 18 | Wt 121.0 lb

## 2023-02-19 DIAGNOSIS — C9000 Multiple myeloma not having achieved remission: Secondary | ICD-10-CM | POA: Diagnosis not present

## 2023-02-19 DIAGNOSIS — Z5112 Encounter for antineoplastic immunotherapy: Secondary | ICD-10-CM | POA: Diagnosis not present

## 2023-02-19 LAB — KAPPA/LAMBDA LIGHT CHAINS
Kappa free light chain: 22.2 mg/L — ABNORMAL HIGH (ref 3.3–19.4)
Kappa, lambda light chain ratio: 0.71 (ref 0.26–1.65)
Lambda free light chains: 31.2 mg/L — ABNORMAL HIGH (ref 5.7–26.3)

## 2023-02-19 MED ORDER — BORTEZOMIB CHEMO SQ INJECTION 3.5 MG (2.5MG/ML)
1.3000 mg/m2 | Freq: Once | INTRAMUSCULAR | Status: AC
  Administered 2023-02-19: 2 mg via SUBCUTANEOUS
  Filled 2023-02-19: qty 0.8

## 2023-02-19 MED ORDER — DEXAMETHASONE 4 MG PO TABS
10.0000 mg | ORAL_TABLET | Freq: Once | ORAL | Status: AC
  Administered 2023-02-19: 10 mg via ORAL
  Filled 2023-02-19: qty 3

## 2023-02-19 NOTE — Progress Notes (Signed)
Indea M Mccowan presents today for injection per the provider's orders.  Velcade administration without incident; injection site WNL; see MAR for injection details.  Patient tolerated procedure well and without incident.  No questions or complaints noted at this time.   Velcade given today per MD orders. Tolerated infusion without adverse affects. Vital signs stable. No complaints at this time. Discharged from clinic via wheelchair in stable condition. Alert and oriented x 3. F/U with [facility] as scheduled.

## 2023-02-19 NOTE — Patient Instructions (Signed)
[facility]  Discharge Instructions: Thank you for choosing [facility] to provide your oncology and hematology care.  If you have a lab appointment with the [facility], please come in thru the Main Entrance and check in at the main information desk.  Wear comfortable clothing and clothing appropriate for easy access to any Portacath or PICC line.   We strive to give you quality time with your provider. You may need to reschedule your appointment if you arrive late (15 or more minutes).  Arriving late affects you and other patients whose appointments are after yours.  Also, if you miss three or more appointments without notifying the office, you may be dismissed from the clinic at the provider's discretion.      For prescription refill requests, have your pharmacy contact our office and allow 72 hours for refills to be completed.    Today you received Velcade  Bortezomib Injection What is this medication? BORTEZOMIB (bor TEZ oh mib) treats lymphoma. It may also be used to treat multiple myeloma, a type of bone marrow cancer. It works by blocking a protein that causes cancer cells to grow and multiply. This helps to slow or stop the spread of cancer cells. This medicine may be used for other purposes; ask your health care provider or pharmacist if you have questions. COMMON BRAND NAME(S): Velcade What should I tell my care team before I take this medication? They need to know if you have any of these conditions: Dehydration Diabetes Heart disease Liver disease Tingling of the fingers or toes or other nerve disorder An unusual or allergic reaction to bortezomib, other medications, foods, dyes, or preservatives If you or your partner are pregnant or trying to get pregnant Breastfeeding How should I use this medication? This medication is injected into a vein or under the skin. It is given by your care team in a hospital or clinic setting. Talk to your  care team about the use of this medication in children. Special care may be needed. Overdosage: If you think you have taken too much of this medicine contact a poison control center or emergency room at once. NOTE: This medicine is only for you. Do not share this medicine with others. What if I miss a dose? Keep appointments for follow-up doses. It is important not to miss your dose. Call your care team if you are unable to keep an appointment. What may interact with this medication? Ketoconazole Rifampin This list may not describe all possible interactions. Give your health care provider a list of all the medicines, herbs, non-prescription drugs, or dietary supplements you use. Also tell them if you smoke, drink alcohol, or use illegal drugs. Some items may interact with your medicine. What should I watch for while using this medication? Your condition will be monitored carefully while you are receiving this medication. You may need blood work while taking this medication. This medication may affect your coordination, reaction time, or judgment. Do not drive or operate machinery until you know how this medication affects you. Sit up or stand slowly to reduce the risk of dizzy or fainting spells. Drinking alcohol with this medication can increase the risk of these side effects. This medication may increase your risk of getting an infection. Call your care team for advice if you get a fever, chills, sore throat, or other symptoms of a cold or flu. Do not treat yourself. Try to avoid being around people who are sick. Check with  your care team if you have severe diarrhea, nausea, and vomiting, or if you sweat a lot. The loss of too much body fluid may make it dangerous for you to take this medication. Talk to your care team if you may be pregnant. Serious birth defects can occur if you take this medication during pregnancy and for 7 months after the last dose. You will need a negative pregnancy test  before starting this medication. Contraception is recommended while taking this medication and for 7 months after the last dose. Your care team can help you find the option that works for you. If your partner can get pregnant, use a condom during sex while taking this medication and for 4 months after the last dose. Do not breastfeed while taking this medication and for 2 months after the last dose. This medication may cause infertility. Talk to your care team if you are concerned about your fertility. What side effects may I notice from receiving this medication? Side effects that you should report to your care team as soon as possible: Allergic reactions--skin rash, itching, hives, swelling of the face, lips, tongue, or throat Bleeding--bloody or black, tar-like stools, vomiting blood or brown material that looks like coffee grounds, red or dark brown urine, small red or purple spots on skin, unusual bruising or bleeding Bleeding in the brain--severe headache, stiff neck, confusion, dizziness, change in vision, numbness or weakness of the face, arm, or leg, trouble speaking, trouble walking, vomiting Bowel blockage--stomach cramping, unable to have a bowel movement or pass gas, loss of appetite, vomiting Heart failure--shortness of breath, swelling of the ankles, feet, or hands, sudden weight gain, unusual weakness or fatigue Infection--fever, chills, cough, sore throat, wounds that don't heal, pain or trouble when passing urine, general feeling of discomfort or being unwell Liver injury--right upper belly pain, loss of appetite, nausea, light-colored stool, dark yellow or brown urine, yellowing skin or eyes, unusual weakness or fatigue Low blood pressure--dizziness, feeling faint or lightheaded, blurry vision Lung injury--shortness of breath or trouble breathing, cough, spitting up blood, chest pain, fever Pain, tingling, or numbness in the hands or feet Severe or prolonged diarrhea Stomach pain,  bloody diarrhea, pale skin, unusual weakness or fatigue, decrease in the amount of urine, which may be signs of hemolytic uremic syndrome Sudden and severe headache, confusion, change in vision, seizures, which may be signs of posterior reversible encephalopathy syndrome (PRES) TTP--purple spots on the skin or inside the mouth, pale skin, yellowing skin or eyes, unusual weakness or fatigue, fever, fast or irregular heartbeat, confusion, change in vision, trouble speaking, trouble walking Tumor lysis syndrome (TLS)--nausea, vomiting, diarrhea, decrease in the amount of urine, dark urine, unusual weakness or fatigue, confusion, muscle pain or cramps, fast or irregular heartbeat, joint pain Side effects that usually do not require medical attention (report to your care team if they continue or are bothersome): Constipation Diarrhea Fatigue Loss of appetite Nausea This list may not describe all possible side effects. Call your doctor for medical advice about side effects. You may report side effects to FDA at 1-800-FDA-1088. Where should I keep my medication? This medication is given in a hospital or clinic. It will not be stored at home. NOTE: This sheet is a summary. It may not cover all possible information. If you have questions about this medicine, talk to your doctor, pharmacist, or health care provider.  2023 Elsevier/Gold Standard (2022-04-30 00:00:00)      BELOW ARE SYMPTOMS THAT SHOULD BE REPORTED IMMEDIATELY: *FEVER GREATER  THAN 100.4 F (38 C) OR HIGHER *CHILLS OR SWEATING *NAUSEA AND VOMITING THAT IS NOT CONTROLLED WITH YOUR NAUSEA MEDICATION *UNUSUAL SHORTNESS OF BREATH *UNUSUAL BRUISING OR BLEEDING *URINARY PROBLEMS (pain or burning when urinating, or frequent urination) *BOWEL PROBLEMS (unusual diarrhea, constipation, pain near the anus) TENDERNESS IN MOUTH AND THROAT WITH OR WITHOUT PRESENCE OF ULCERS (sore throat, sores in mouth, or a toothache) UNUSUAL RASH, SWELLING OR  PAIN  UNUSUAL VAGINAL DISCHARGE OR ITCHING   Items with * indicate a potential emergency and should be followed up as soon as possible or go to the Emergency Department if any problems should occur.  Please show the CHEMOTHERAPY ALERT CARD or IMMUNOTHERAPY ALERT CARD at check-in to the Emergency Department and triage nurse.  Should you have questions after your visit or need to cancel or reschedule your appointment, please contact [facility] [phone]  and follow the prompts.  Office hours are 8:00 a.m. to 4:30 p.m. Monday - Friday. Please note that voicemails left after 4:00 p.m. may not be returned until the following business day.  We are closed weekends and major holidays. You have access to a nurse at all times for urgent questions. Please call the main number to the clinic [phone] and follow the prompts.  For any non-urgent questions, you may also contact your provider using MyChart. We now offer e-Visits for anyone 55 and older to request care online for non-urgent symptoms. For details visit [email or web address].   Also download the MyChart app! Go to the app store, search "MyChart", open the app, select [facility], and log in with your MyChart username and password.

## 2023-02-24 LAB — PROTEIN ELECTROPHORESIS, SERUM
A/G Ratio: 1.3 (ref 0.7–1.7)
Albumin ELP: 3.6 g/dL (ref 2.9–4.4)
Alpha-1-Globulin: 0.2 g/dL (ref 0.0–0.4)
Alpha-2-Globulin: 0.7 g/dL (ref 0.4–1.0)
Beta Globulin: 0.7 g/dL (ref 0.7–1.3)
Gamma Globulin: 1.3 g/dL (ref 0.4–1.8)
Globulin, Total: 2.8 g/dL (ref 2.2–3.9)
M-Spike, %: 0.7 g/dL — ABNORMAL HIGH
Total Protein ELP: 6.4 g/dL (ref 6.0–8.5)

## 2023-02-25 ENCOUNTER — Encounter: Payer: Self-pay | Admitting: Adult Health

## 2023-02-25 ENCOUNTER — Inpatient Hospital Stay: Payer: Medicare PPO

## 2023-02-25 ENCOUNTER — Non-Acute Institutional Stay (SKILLED_NURSING_FACILITY): Payer: Medicare PPO | Admitting: Adult Health

## 2023-02-25 DIAGNOSIS — K529 Noninfective gastroenteritis and colitis, unspecified: Secondary | ICD-10-CM

## 2023-02-25 DIAGNOSIS — C9000 Multiple myeloma not having achieved remission: Secondary | ICD-10-CM

## 2023-02-25 DIAGNOSIS — K8689 Other specified diseases of pancreas: Secondary | ICD-10-CM | POA: Diagnosis not present

## 2023-02-25 DIAGNOSIS — Z5112 Encounter for antineoplastic immunotherapy: Secondary | ICD-10-CM | POA: Diagnosis not present

## 2023-02-25 LAB — CBC WITH DIFFERENTIAL/PLATELET
Abs Immature Granulocytes: 0 10*3/uL (ref 0.00–0.07)
Basophils Absolute: 0 10*3/uL (ref 0.0–0.1)
Basophils Relative: 0 %
Eosinophils Absolute: 0.1 10*3/uL (ref 0.0–0.5)
Eosinophils Relative: 3 %
HCT: 34.6 % — ABNORMAL LOW (ref 36.0–46.0)
Hemoglobin: 11.4 g/dL — ABNORMAL LOW (ref 12.0–15.0)
Immature Granulocytes: 0 %
Lymphocytes Relative: 18 %
Lymphs Abs: 0.6 10*3/uL — ABNORMAL LOW (ref 0.7–4.0)
MCH: 33.4 pg (ref 26.0–34.0)
MCHC: 32.9 g/dL (ref 30.0–36.0)
MCV: 101.5 fL — ABNORMAL HIGH (ref 80.0–100.0)
Monocytes Absolute: 0.3 10*3/uL (ref 0.1–1.0)
Monocytes Relative: 10 %
Neutro Abs: 2.1 10*3/uL (ref 1.7–7.7)
Neutrophils Relative %: 69 %
Platelets: 88 10*3/uL — ABNORMAL LOW (ref 150–400)
RBC: 3.41 MIL/uL — ABNORMAL LOW (ref 3.87–5.11)
RDW: 15.1 % (ref 11.5–15.5)
WBC: 3.1 10*3/uL — ABNORMAL LOW (ref 4.0–10.5)
nRBC: 0 % (ref 0.0–0.2)

## 2023-02-25 LAB — COMPREHENSIVE METABOLIC PANEL
ALT: 19 U/L (ref 0–44)
AST: 34 U/L (ref 15–41)
Albumin: 3.2 g/dL — ABNORMAL LOW (ref 3.5–5.0)
Alkaline Phosphatase: 52 U/L (ref 38–126)
Anion gap: 9 (ref 5–15)
BUN: 24 mg/dL — ABNORMAL HIGH (ref 8–23)
CO2: 25 mmol/L (ref 22–32)
Calcium: 8.7 mg/dL — ABNORMAL LOW (ref 8.9–10.3)
Chloride: 109 mmol/L (ref 98–111)
Creatinine, Ser: 1.52 mg/dL — ABNORMAL HIGH (ref 0.44–1.00)
GFR, Estimated: 35 mL/min — ABNORMAL LOW (ref >60)
Glucose, Bld: 116 mg/dL — ABNORMAL HIGH (ref 70–99)
Potassium: 4.1 mmol/L (ref 3.5–5.1)
Sodium: 143 mmol/L (ref 135–145)
Total Bilirubin: 0.6 mg/dL (ref 0.3–1.2)
Total Protein: 6.5 g/dL (ref 6.5–8.1)

## 2023-02-25 LAB — MAGNESIUM: Magnesium: 2.3 mg/dL (ref 1.7–2.4)

## 2023-02-25 NOTE — Progress Notes (Signed)
Location:  [facility] Room Number: [place] of Service:  SNF (31)   CODE STATUS: DNR  Allergies  Allergen Reactions   Lotensin [Benazepril]     Chief Complaint  Patient presents with   Acute Visit    Diarrhea    HPI:  She continues to have diarrheal stools. She is taking the max dose od creon and max dose of imodium. She is taking questran 2 gm twice daily. She denies abdominal pain. No changes in appetite; no nausea or vomiting.   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[facility])    Dementia ([facility])    Diabetes mellitus with CKD    H/O partial nephrectomy    Hypokalemia    Hypothyroidism    Impaired cognition    Multiple myeloma (H[facility])    Dr Maylon Peppers, [facility]    Past Surgical History:  Procedure Laterality Date   ABDOMINAL HYSTERECTOMY     CRANIOTOMY     for lymphoma   YAG LASER APPLICATION Right [id number]   Procedure: YAG LASER APPLICATION;  Surgeon: [name], MD;  Location: [facility];  Service: Ophthalmology;  Laterality: Right;    Social History   Socioeconomic History   Marital status: Single    Spouse name: Not on file   Number of children: Not on file   Years of education: Not on file   Highest education level: Not on file  Occupational History   Occupation: retired   Tobacco Use   Smoking status: Never   Smokeless tobacco: Never  Vaping Use   Vaping Use: Never used  Substance and Sexual Activity   Alcohol use: No   Drug use: No   Sexual activity: Never  Other Topics Concern   Not on file  Social History Narrative   Long term resident of [facility]    Social Determinants of Health   Financial Resource Strain: [place]  (10/16/2020)   Overall Financial Resource Strain (CARDIA)    Difficulty of Paying Living Expenses: Not very hard  Food Insecurity: No Food Insecurity (10/16/2020)   Hunger Vital Sign    Worried About Running Out of Food in the Last Year:  Never true    Ran Out of Food in the Last Year: Never true  Transportation Needs: No Transportation Needs (10/16/2020)   PRAPARE - Hydrologist (Medical): No    Lack of Transportation (Non-Medical): No  Physical Activity: Inactive (10/16/2020)   Exercise Vital Sign    Days of Exercise per Week: 0 days    Minutes of Exercise per Session: 0 min  Stress: No Stress Concern Present (10/16/2020)   [place]    Feeling of Stress : Not at all  Social Connections: Moderately Isolated (10/16/2020)   Social Connection and Isolation Panel [NHANES]    Frequency of Communication with Friends and Family: More than three times a week    Frequency of Social Gatherings with Friends and Family: Once a week    Attends Religious Services: More than 4 times per year    Active Member of [employer] or Organizations: No    Attends Archivist Meetings: Never    Marital Status: Never married  Human resources officer Violence: Not on file   Family History  Problem Relation Age of Onset   Depression Mother    Diabetes Mother  Heart disease Father    Cancer - Prostate Brother    Cancer Brother    Cancer Sister       VITAL SIGNS BP 134/89   Pulse 77   Temp 98.6 F (37 C)   Ht 5\' 9"  (1.753 m)   Wt 121 lb (54.9 kg)   SpO2 94%   BMI 17.87 kg/m   Outpatient Encounter Medications as of 02/25/2023  Medication Sig   acyclovir (ZOVIRAX) 400 MG tablet TAKE 1 TABLET BY MOUTH TWICE DAILY   aspirin EC 81 MG tablet Take 81 mg by mouth daily.   busPIRone (BUSPAR) 10 MG tablet Take 20 mg by mouth 3 (three) times daily. For anxiety   Calcium Carbonate-Vit D-Min (CALCIUM 600+D PLUS MINERALS) 600-400 MG-UNIT CHEW Chew 1 tablet by mouth daily.   Cholecalciferol (VITAMIN D) 50 MCG (2000 UT) CAPS Take by mouth daily.   cholestyramine (QUESTRAN) 4 g packet Take 4 g by mouth 2 (two) times daily between meals.   LACTOBACILLUS  PROBIOTIC PO Take 2 capsules by mouth daily.   levothyroxine (SYNTHROID, LEVOTHROID) 25 MCG tablet Take 25 mcg by mouth daily before breakfast.   lipase/protease/amylase (CREON) 36000 UNITS CPEP capsule 36,000-114,000- 180,000 unit; amt: 1; oral Special Instructions: Take one capsule with snacks if eaten between meals. Twice A Day Between Meals as needed   lipase/protease/amylase (CREON) 36000 UNITS CPEP capsule Take 3 capsules by mouth 3 (three) times daily with meals.   loperamide (IMODIUM A-D) 2 MG tablet Take 2 mg by mouth 3 (three) times daily. For diarrhea   losartan (COZAAR) 50 MG tablet Take 1 tablet (50 mg total) by mouth daily.   NON FORMULARY Dysphagia 2 diet with thin liquids   NON FORMULARY Wanderguard #2237 to ankle for safety awareness. Check placement and function qshift. Special Instructions: Check placement and function qshift. Every Shift Day, Evening, Night   potassium chloride SA (KLOR-CON) 20 MEQ tablet Take 20 mEq by mouth daily.    [DISCONTINUED] alendronate (FOSAMAX) 70 MG tablet Take 70 mg by mouth once a week. Take with a full glass of water on an empty stomach.      SIGNIFICANT DIAGNOSTIC EXAMS  PREVIOUS   08-10-20: t score: -3.758  03-16-22: chest x-ray:  No radiographic evidence of acute cardiopulmonary disease Mild cardiomegaly  Mild osteopenia Mild osteoarthritis   NO NEW EXAMS     LABS REVIEWED PREVIOUS    03-17-22: wbc 3.6; hgb 10.9; hct 34.7; mcv 97.2 plt 121; glucose 140; bun 20; creat 1.16; k+ 3.6; na++ 144; ca 9.2 GFR 49; protein 6.7; albumin 3.4 ast 69 03-17-22: wbc 4.1; hgb 10.9; hct 32.5; mcv 96.2 plt 98; glucose 96; bbun 22; creat 1.17; k+ 3.5; na++ 142; ca 9.0; GFR 48 d-dimer 1.67; CRP 1.2  03-24-22: d-dimer 0.72 05-13-22: wbc 5.2; hgb 10.7; hct 32.6; mcv 98.5 plt 148; glucose 85; bun 21; creat 1.31; k+ 3.4; na++ 141; ca 9.0; gfr 42; protein 6.5; albumin 3.3  05-15-22: hgb a1c 5.4 05-20-22: wbc 4.5; hgb 11.6; hct 35.6; mcv 97.8 plt 124 06-09-22:  wbc 4.7; hgb 10.3; hct 32.0; mcv 97.6 plt 164; glucose 94; bun 23; creat 1.11; k+ 3.8; na++ 141; ca 9.1; gfr 51 protein 6.2 albumin 3.2 tah 3.485 06-25-22: wbc 4.2; hgb 10.7; hct 33.8; mcv 98.3 plt 110; glucose 97; bun 21; creat 1.10; k+ 3.6; na++ 140; ca 8.9; gfr 52; protein 6.4; albumin 3.3 08-06-22: wbc 4.4; hgb 11.0; hct 34.5; mcv 99.4 plt 130; glucose 138; bun 24;  creat 1.38; k+ 4.1; na++ 141; ca 8.9; gfr 39; protein 6.5; albumin 3.2; mag 2.3 09-09-22: wbc 5.1; hgb 11.3; hct 34.5; mcv 99.4 plt 132; glucose 119; bun 22; creat 1.29; k+ 4.1; na++ 143 ca 8.8; gfr 42; protein 6.6 albumin 3.2; mag 2.2   10-09-22: wbc 4.3; hgb 11.3; hct 35.0; mcv 101.7 plt 93; glucose 147; bun 21; creat 1.15; k+ 3.6; na++ 146; ca 9.0; gfr 49; protein 6.8; albumin 3.3  12-24-22: wbc 4.9; hgb 11.8; hct 36.1; mcv 101.4 plt 154; glucose 95; bun 21; creat 1.35; k+ 4.1; na++ 141; ca 9.1; gfr 40; mag 2.4; protein 6.7; albumin 3.5   TODAY  01-19-23: hgb A1c; 5.6; micro/creat ratio: 6  01-21-23: wbc 4.5 hgb 11.7; hct 35.5; mcv 101.7 plt 132; glucose 110; bun 28;creat 1.59; k+ 4.1; na++ 140; ca 8.8; gfr 33; protein 6.7 albumin 3.4 mag 2.5 02-25-23; wbc 3.1; hgb 11.4; hct 34.5; mcv 101.5 plt 88; glucose 116; bun 24; creat 1.52; k+ 4.1; na++ 143; ca 8.7; gfr 35; protein 6.5; albumin 3.2 mag 2.3   Review of Systems  Constitutional:  Negative for malaise/fatigue.  Respiratory:  Negative for cough and shortness of breath.   Cardiovascular:  Negative for chest pain, palpitations and leg swelling.  Gastrointestinal:  Negative for abdominal pain, constipation and heartburn.  Musculoskeletal:  Negative for back pain, joint pain and myalgias.  Skin: Negative.   Neurological:  Negative for dizziness.  Psychiatric/Behavioral:  The patient is not nervous/anxious.    Physical Exam Constitutional:      General: She is not in acute distress.    Appearance: She is underweight. She is not diaphoretic.  Neck:     Thyroid: No thyromegaly.   Cardiovascular:     Rate and Rhythm: Normal rate and regular rhythm.     Pulses: Normal pulses.     Heart sounds: Normal heart sounds.  Pulmonary:     Effort: Pulmonary effort is normal. No respiratory distress.     Breath sounds: Normal breath sounds.  Abdominal:     General: Bowel sounds are normal. There is no distension.     Palpations: Abdomen is soft.     Tenderness: There is no abdominal tenderness.  Musculoskeletal:        General: Normal range of motion.     Cervical back: Neck supple.     Right lower leg: No edema.     Left lower leg: No edema.  Lymphadenopathy:     Cervical: No cervical adenopathy.  Skin:    General: Skin is warm and dry.  Neurological:     Mental Status: She is alert. Mental status is at baseline.  Psychiatric:        Mood and Affect: Mood normal.      ASSESSMENT/ PLAN:  TODAY  Pancreatic insufficiency Chronic diarrhea  Will increase questran to 4 gm twice daily and will monitor    Ok Edwards NP [facility]  call [phone]

## 2023-02-26 ENCOUNTER — Inpatient Hospital Stay: Payer: Medicare PPO

## 2023-02-26 VITALS — BP 164/71 | HR 63 | Temp 96.1°F | Resp 18 | Wt 119.4 lb

## 2023-02-26 DIAGNOSIS — Z5112 Encounter for antineoplastic immunotherapy: Secondary | ICD-10-CM | POA: Diagnosis not present

## 2023-02-26 DIAGNOSIS — C9000 Multiple myeloma not having achieved remission: Secondary | ICD-10-CM

## 2023-02-26 MED ORDER — DEXAMETHASONE 4 MG PO TABS
10.0000 mg | ORAL_TABLET | Freq: Once | ORAL | Status: AC
  Administered 2023-02-26: 10 mg via ORAL
  Filled 2023-02-26: qty 3

## 2023-02-26 MED ORDER — BORTEZOMIB CHEMO SQ INJECTION 3.5 MG (2.5MG/ML)
1.3000 mg/m2 | Freq: Once | INTRAMUSCULAR | Status: AC
  Administered 2023-02-26: 2 mg via SUBCUTANEOUS
  Filled 2023-02-26: qty 0.8

## 2023-02-26 NOTE — Progress Notes (Signed)
Pt presents today for Velcade per provider's order. Vital signs and labs WNL for treatment. Okay to proceed with treatment today.  Velcade given today per MD orders. Tolerated infusion without adverse affects. Vital signs stable. No complaints at this time. Discharged from clinic via wheelchair in stable condition. Alert and oriented x 3. F/U with [facility] as scheduled.   

## 2023-02-26 NOTE — Patient Instructions (Signed)
[facility]  Discharge Instructions: Thank you for choosing [facility] to provide your oncology and hematology care.  If you have a lab appointment with the Cancer Center, please come in thru the Main Entrance and check in at the main information desk.  Wear comfortable clothing and clothing appropriate for easy access to any Portacath or PICC line.   We strive to give you quality time with your provider. You may need to reschedule your appointment if you arrive late (15 or more minutes).  Arriving late affects you and other patients whose appointments are after yours.  Also, if you miss three or more appointments without notifying the office, you may be dismissed from the clinic at the provider's discretion.      For prescription refill requests, have your pharmacy contact our office and allow 72 hours for refills to be completed.    Today you received the following chemotherapy and/or immunotherapy agents Velcade   To help prevent nausea and vomiting after your treatment, we encourage you to take your nausea medication as directed.  Bortezomib Injection What is this medication? BORTEZOMIB (bor TEZ oh mib) treats lymphoma. It may also be used to treat multiple myeloma, a type of bone marrow cancer. It works by blocking a protein that causes cancer cells to grow and multiply. This helps to slow or stop the spread of cancer cells. This medicine may be used for other purposes; ask your health care provider or pharmacist if you have questions. COMMON BRAND NAME(S): Velcade What should I tell my care team before I take this medication? They need to know if you have any of these conditions: Dehydration Diabetes Heart disease Liver disease Tingling of the fingers or toes or other nerve disorder An unusual or allergic reaction to bortezomib, other medications, foods, dyes, or preservatives If you or your partner are pregnant or trying to get  pregnant Breastfeeding How should I use this medication? This medication is injected into a vein or under the skin. It is given by your care team in a hospital or clinic setting. Talk to your care team about the use of this medication in children. Special care may be needed. Overdosage: If you think you have taken too much of this medicine contact a poison control center or emergency room at once. NOTE: This medicine is only for you. Do not share this medicine with others. What if I miss a dose? Keep appointments for follow-up doses. It is important not to miss your dose. Call your care team if you are unable to keep an appointment. What may interact with this medication? Ketoconazole Rifampin This list may not describe all possible interactions. Give your health care provider a list of all the medicines, herbs, non-prescription drugs, or dietary supplements you use. Also tell them if you smoke, drink alcohol, or use illegal drugs. Some items may interact with your medicine. What should I watch for while using this medication? Your condition will be monitored carefully while you are receiving this medication. You may need blood work while taking this medication. This medication may affect your coordination, reaction time, or judgment. Do not drive or operate machinery until you know how this medication affects you. Sit up or stand slowly to reduce the risk of dizzy or fainting spells. Drinking alcohol with this medication can increase the risk of these side effects. This medication may increase your risk of getting an infection. Call your care team for advice if you get a   fever, chills, sore throat, or other symptoms of a cold or flu. Do not treat yourself. Try to avoid being around people who are sick. Check with your care team if you have severe diarrhea, nausea, and vomiting, or if you sweat a lot. The loss of too much body fluid may make it dangerous for you to take this medication. Talk to  your care team if you may be pregnant. Serious birth defects can occur if you take this medication during pregnancy and for 7 months after the last dose. You will need a negative pregnancy test before starting this medication. Contraception is recommended while taking this medication and for 7 months after the last dose. Your care team can help you find the option that works for you. If your partner can get pregnant, use a condom during sex while taking this medication and for 4 months after the last dose. Do not breastfeed while taking this medication and for 2 months after the last dose. This medication may cause infertility. Talk to your care team if you are concerned about your fertility. What side effects may I notice from receiving this medication? Side effects that you should report to your care team as soon as possible: Allergic reactions--skin rash, itching, hives, swelling of the face, lips, tongue, or throat Bleeding--bloody or black, tar-like stools, vomiting blood or brown material that looks like coffee grounds, red or dark brown urine, small red or purple spots on skin, unusual bruising or bleeding Bleeding in the brain--severe headache, stiff neck, confusion, dizziness, change in vision, numbness or weakness of the face, arm, or leg, trouble speaking, trouble walking, vomiting Bowel blockage--stomach cramping, unable to have a bowel movement or pass gas, loss of appetite, vomiting Heart failure--shortness of breath, swelling of the ankles, feet, or hands, sudden weight gain, unusual weakness or fatigue Infection--fever, chills, cough, sore throat, wounds that don't heal, pain or trouble when passing urine, general feeling of discomfort or being unwell Liver injury--right upper belly pain, loss of appetite, nausea, light-colored stool, dark yellow or brown urine, yellowing skin or eyes, unusual weakness or fatigue Low blood pressure--dizziness, feeling faint or lightheaded, blurry  vision Lung injury--shortness of breath or trouble breathing, cough, spitting up blood, chest pain, fever Pain, tingling, or numbness in the hands or feet Severe or prolonged diarrhea Stomach pain, bloody diarrhea, pale skin, unusual weakness or fatigue, decrease in the amount of urine, which may be signs of hemolytic uremic syndrome Sudden and severe headache, confusion, change in vision, seizures, which may be signs of posterior reversible encephalopathy syndrome (PRES) TTP--purple spots on the skin or inside the mouth, pale skin, yellowing skin or eyes, unusual weakness or fatigue, fever, fast or irregular heartbeat, confusion, change in vision, trouble speaking, trouble walking Tumor lysis syndrome (TLS)--nausea, vomiting, diarrhea, decrease in the amount of urine, dark urine, unusual weakness or fatigue, confusion, muscle pain or cramps, fast or irregular heartbeat, joint pain Side effects that usually do not require medical attention (report to your care team if they continue or are bothersome): Constipation Diarrhea Fatigue Loss of appetite Nausea This list may not describe all possible side effects. Call your doctor for medical advice about side effects. You may report side effects to FDA at 1-800-FDA-1088. Where should I keep my medication? This medication is given in a hospital or clinic. It will not be stored at home. NOTE: This sheet is a summary. It may not cover all possible information. If you have questions about this medicine, talk to your   doctor, pharmacist, or health care provider.  2023 Elsevier/Gold Standard (2022-04-30 00:00:00)   BELOW ARE SYMPTOMS THAT SHOULD BE REPORTED IMMEDIATELY: *FEVER GREATER THAN 100.4 F (38 C) OR HIGHER *CHILLS OR SWEATING *NAUSEA AND VOMITING THAT IS NOT CONTROLLED WITH YOUR NAUSEA MEDICATION *UNUSUAL SHORTNESS OF BREATH *UNUSUAL BRUISING OR BLEEDING *URINARY PROBLEMS (pain or burning when urinating, or frequent urination) *BOWEL  PROBLEMS (unusual diarrhea, constipation, pain near the anus) TENDERNESS IN MOUTH AND THROAT WITH OR WITHOUT PRESENCE OF ULCERS (sore throat, sores in mouth, or a toothache) UNUSUAL RASH, SWELLING OR PAIN  UNUSUAL VAGINAL DISCHARGE OR ITCHING   Items with * indicate a potential emergency and should be followed up as soon as possible or go to the Emergency Department if any problems should occur.  Please show the CHEMOTHERAPY ALERT CARD or IMMUNOTHERAPY ALERT CARD at check-in to the Emergency Department and triage nurse.  Should you have questions after your visit or need to cancel or reschedule your appointment, please contact [facility] [phone]  and follow the prompts.  Office hours are 8:00 a.m. to 4:30 p.m. Monday - Friday. Please note that voicemails left after 4:00 p.m. may not be returned until the following business day.  We are closed weekends and major holidays. You have access to a nurse at all times for urgent questions. Please call the main number to the clinic [phone] and follow the prompts.  For any non-urgent questions, you may also contact your provider using MyChart. We now offer e-Visits for anyone 18 and older to request care online for non-urgent symptoms. For details visit [email or web address].   Also download the MyChart app! Go to the app store, search "MyChart", open the app, select [place], and log in with your MyChart username and password.   

## 2023-03-03 ENCOUNTER — Other Ambulatory Visit: Payer: Self-pay

## 2023-03-03 ENCOUNTER — Encounter: Payer: Self-pay | Admitting: Adult Health

## 2023-03-03 ENCOUNTER — Non-Acute Institutional Stay (SKILLED_NURSING_FACILITY): Payer: Medicare PPO | Admitting: Adult Health

## 2023-03-03 DIAGNOSIS — C9000 Multiple myeloma not having achieved remission: Secondary | ICD-10-CM

## 2023-03-03 DIAGNOSIS — R627 Adult failure to thrive: Secondary | ICD-10-CM

## 2023-03-03 DIAGNOSIS — I129 Hypertensive chronic kidney disease with stage 1 through stage 4 chronic kidney disease, or unspecified chronic kidney disease: Secondary | ICD-10-CM | POA: Diagnosis not present

## 2023-03-03 DIAGNOSIS — E039 Hypothyroidism, unspecified: Secondary | ICD-10-CM | POA: Diagnosis not present

## 2023-03-03 DIAGNOSIS — E876 Hypokalemia: Secondary | ICD-10-CM | POA: Diagnosis not present

## 2023-03-03 DIAGNOSIS — E1122 Type 2 diabetes mellitus with diabetic chronic kidney disease: Secondary | ICD-10-CM

## 2023-03-03 DIAGNOSIS — N1832 Chronic kidney disease, stage 3b: Secondary | ICD-10-CM | POA: Diagnosis not present

## 2023-03-03 NOTE — Progress Notes (Unsigned)
Location:  [place] Room Number: 148-W Place of Service:  SNF (31) Provider: Ok Edwards, NP  CODE STATUS: DNR  Allergies  Allergen Reactions   Lotensin [Benazepril]     Chief Complaint  Patient presents with   Medical Management of Chronic Issues              Failure to thrive in adult: Hypokalemia: Hypothyroidism unspecified type: Hypertension associated with stage 3b chronic kidney disease due to type 2 diabetes mellitus:                     HPI:  She is a 77 year old long term resident of this facility being seen for the management of her chronic illnesses:  Failure to thrive in adult: Hypokalemia: Hypothyroidism unspecified type: Hypertension associated with stage 3b chronic kidney disease due to type 2 diabetes mellitus. There are reports of uncontrolled pain. No reports of further diarrhea. 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[place])    Dementia ([place])    Diabetes mellitus with CKD    H/O partial nephrectomy    Hypokalemia    Hypothyroidism    Impaired cognition    Multiple myeloma (HCC)    Dr Maylon Peppers, [facility]    Past Surgical History:  Procedure Laterality Date   ABDOMINAL HYSTERECTOMY     CRANIOTOMY     for lymphoma   YAG LASER APPLICATION Right [id number]   Procedure: YAG LASER APPLICATION;  Surgeon: [name], MD;  Location: [facility];  Service: Ophthalmology;  Laterality: Right;    Social History   Socioeconomic History   Marital status: Single    Spouse name: Not on file   Number of children: Not on file   Years of education: Not on file   Highest education level: Not on file  Occupational History   Occupation: retired   Tobacco Use   Smoking status: Never   Smokeless tobacco: Never  Vaping Use   Vaping Use: Never used  Substance and Sexual Activity   Alcohol use: No   Drug use: No   Sexual activity: Never  Other Topics Concern    Not on file  Social History Narrative   [facility]    Social Determinants of Health   Financial Resource Strain: [place]  (10/16/2020)   Overall Financial Resource Strain (CARDIA)    Difficulty of Paying Living Expenses: Not very hard  Food Insecurity: No Food Insecurity (10/16/2020)   Hunger Vital Sign    Worried About Running Out of Food in the Last Year: Never true    Ran Out of Food in the Last Year: Never true  Transportation Needs: No Transportation Needs (10/16/2020)   PRAPARE - Hydrologist (Medical): No    Lack of Transportation (Non-Medical): No  Physical Activity: Inactive (10/16/2020)   Exercise Vital Sign    Days of Exercise per Week: 0 days    Minutes of Exercise per Session: 0 min  Stress: No Stress Concern Present (10/16/2020)   [place]    Feeling of Stress : Not at all  Social Connections: Moderately Isolated (10/16/2020)   Social Connection and Isolation Panel [NHANES]    Frequency of Communication with Friends and Family: More than three times a week    Frequency of Social  Gatherings with Friends and Family: Once a week    Attends Religious Services: More than 4 times per year    Active Member of [employer] or Organizations: No    Attends Music therapist: Never    Marital Status: Never married  Human resources officer Violence: Not on file   Family History  Problem Relation Age of Onset   Depression Mother    Diabetes Mother    Heart disease Father    Cancer - Prostate Brother    Cancer Brother    Cancer Sister       VITAL SIGNS BP 120/80   Pulse 70   Temp (!) 97.2 F (36.2 C)   Resp 18   Ht 5\' 9"  (1.753 m)   Wt 121 lb 6.4 oz (55.1 kg)   SpO2 99%   BMI 17.93 kg/m   Outpatient Encounter Medications as of 03/03/2023  Medication Sig   acyclovir (ZOVIRAX) 400 MG tablet TAKE 1 TABLET BY MOUTH TWICE DAILY   aspirin EC 81 MG tablet  Take 81 mg by mouth daily.   busPIRone (BUSPAR) 10 MG tablet Take 20 mg by mouth 3 (three) times daily. For anxiety   Calcium Carbonate-Vit D-Min (CALCIUM 600+D PLUS MINERALS) 600-400 MG-UNIT CHEW Chew 1 tablet by mouth daily.   Cholecalciferol (VITAMIN D) 50 MCG (2000 UT) CAPS Take by mouth daily.   cholestyramine (QUESTRAN) 4 g packet Take 4 g by mouth 2 (two) times daily between meals.   LACTOBACILLUS PROBIOTIC PO Take 2 capsules by mouth daily.   levothyroxine (SYNTHROID, LEVOTHROID) 25 MCG tablet Take 25 mcg by mouth daily before breakfast.   lipase/protease/amylase (CREON) 36000 UNITS CPEP capsule 36,000-114,000- 180,000 unit; amt: 1; oral Special Instructions: Take one capsule with snacks if eaten between meals. Twice A Day Between Meals as needed   lipase/protease/amylase (CREON) 36000 UNITS CPEP capsule Take 3 capsules by mouth 3 (three) times daily with meals.   loperamide (IMODIUM A-D) 2 MG tablet Take 2 mg by mouth 3 (three) times daily. For diarrhea   losartan (COZAAR) 50 MG tablet Take 1 tablet (50 mg total) by mouth daily.   NON FORMULARY Dysphagia 2 diet with thin liquids   NON FORMULARY Wanderguard #[id number] to ankle for safety awareness. Check placement and function qshift. Special Instructions: Check placement and function qshift. Every Shift Day, Evening, Night   potassium chloride SA (KLOR-CON) 20 MEQ tablet Take 20 mEq by mouth daily.    Facility-Administered Encounter Medications as of 03/03/2023  Medication   cyanocobalamin ((VITAMIN B-12)) 1000 MCG/ML injection   cyanocobalamin ((VITAMIN B-12)) 1000 MCG/ML injection   denosumab (XGEVA) 120 MG/1.7ML injection   dexamethasone (DECADRON) 4 MG tablet   dexamethasone (DECADRON) 4 MG tablet   diphenhydrAMINE (BENADRYL) 50 MG/ML injection   diphenhydrAMINE (BENADRYL) 50 MG/ML injection   diphenhydrAMINE (BENADRYL) 50 MG/ML injection   epoetin alfa-epbx (RETACRIT) 09323 UNIT/ML injection   epoetin alfa-epbx (RETACRIT) 55732  UNIT/ML injection   epoetin alfa-epbx (RETACRIT) 20254 UNIT/ML injection   fulvestrant (FASLODEX) 250 MG/5ML injection   lanreotide acetate (SOMATULINE DEPOT) 120 MG/0.5ML injection   lanreotide acetate (SOMATULINE DEPOT) 120 MG/0.5ML injection   lanreotide acetate (SOMATULINE DEPOT) 120 MG/0.5ML injection   lanreotide acetate (SOMATULINE DEPOT) 120 MG/0.5ML injection   lidocaine (PF) (XYLOCAINE) 1 % injection   magnesium sulfate 2 GM/50ML IVPB   octreotide (SANDOSTATIN LAR) 30 MG IM injection   octreotide (SANDOSTATIN LAR) 30 MG IM injection   palonosetron (ALOXI) 0.25 MG/5ML injection   palonosetron (ALOXI)  0.25 MG/5ML injection   palonosetron (ALOXI) 0.25 MG/5ML injection   palonosetron (ALOXI) 0.25 MG/5ML injection   potassium chloride 10 MEQ/100ML IVPB   potassium chloride SA (KLOR-CON M) 20 MEQ CR tablet   potassium chloride SA (KLOR-CON M) 20 MEQ CR tablet   prochlorperazine (COMPAZINE) 10 MG tablet   prochlorperazine (COMPAZINE) 10 MG tablet     SIGNIFICANT DIAGNOSTIC EXAMS  PREVIOUS   08-10-20: t score: -3.758  03-16-22: chest x-ray:  No radiographic evidence of acute cardiopulmonary disease Mild cardiomegaly  Mild osteopenia Mild osteoarthritis   NO NEW EXAMS     LABS REVIEWED PREVIOUS    03-17-22: wbc 3.6; hgb 10.9; hct 34.7; mcv 97.2 plt 121; glucose 140; bun 20; creat 1.16; k+ 3.6; na++ 144; ca 9.2 GFR 49; protein 6.7; albumin 3.4 ast 69 03-17-22: wbc 4.1; hgb 10.9; hct 32.5; mcv 96.2 plt 98; glucose 96; bbun 22; creat 1.17; k+ 3.5; na++ 142; ca 9.0; GFR 48 d-dimer 1.67; CRP 1.2  03-24-22: d-dimer 0.72 05-13-22: wbc 5.2; hgb 10.7; hct 32.6; mcv 98.5 plt 148; glucose 85; bun 21; creat 1.31; k+ 3.4; na++ 141; ca 9.0; gfr 42; protein 6.5; albumin 3.3  05-15-22: hgb a1c 5.4 05-20-22: wbc 4.5; hgb 11.6; hct 35.6; mcv 97.8 plt 124 06-09-22: wbc 4.7; hgb 10.3; hct 32.0; mcv 97.6 plt 164; glucose 94; bun 23; creat 1.11; k+ 3.8; na++ 141; ca 9.1; gfr 51 protein 6.2 albumin 3.2 tah  3.485 06-25-22: wbc 4.2; hgb 10.7; hct 33.8; mcv 98.3 plt 110; glucose 97; bun 21; creat 1.10; k+ 3.6; na++ 140; ca 8.9; gfr 52; protein 6.4; albumin 3.3 08-06-22: wbc 4.4; hgb 11.0; hct 34.5; mcv 99.4 plt 130; glucose 138; bun 24; creat 1.38; k+ 4.1; na++ 141; ca 8.9; gfr 39; protein 6.5; albumin 3.2; mag 2.3 09-09-22: wbc 5.1; hgb 11.3; hct 34.5; mcv 99.4 plt 132; glucose 119; bun 22; creat 1.29; k+ 4.1; na++ 143 ca 8.8; gfr 42; protein 6.6 albumin 3.2; mag 2.2   10-09-22: wbc 4.3; hgb 11.3; hct 35.0; mcv 101.7 plt 93; glucose 147; bun 21; creat 1.15; k+ 3.6; na++ 146; ca 9.0; gfr 49; protein 6.8; albumin 3.3  12-24-22: wbc 4.9; hgb 11.8; hct 36.1; mcv 101.4 plt 154; glucose 95; bun 21; creat 1.35; k+ 4.1; na++ 141; ca 9.1; gfr 40; mag 2.4; protein 6.7; albumin 3.5   TODAY  01-19-23: hgb A1c; 5.6; micro/creat ratio: 6  01-21-23: wbc 4.5 hgb 11.7; hct 35.5; mcv 101.7 plt 132; glucose 110; bun 28;creat 1.59; k+ 4.1; na++ 140; ca 8.8; gfr 33; protein 6.7 albumin 3.4 mag 2.5 02-25-23; wbc 3.1; hgb 11.4; hct 34.5; mcv 101.5 plt 88; glucose 116; bun 24; creat 1.52; k+ 4.1; na++ 143; ca 8.7; gfr 35; protein 6.5; albumin 3.2 mag 2.3   Review of Systems  Constitutional:  Negative for malaise/fatigue.  Respiratory:  Negative for cough and shortness of breath.   Cardiovascular:  Negative for chest pain, palpitations and leg swelling.  Gastrointestinal:  Negative for abdominal pain, constipation and heartburn.  Musculoskeletal:  Negative for back pain, joint pain and myalgias.  Skin: Negative.   Neurological:  Negative for dizziness.  Psychiatric/Behavioral:  The patient is not nervous/anxious.    Physical Exam Constitutional:      General: She is not in acute distress.    Appearance: She is underweight. She is not diaphoretic.  Neck:     Thyroid: No thyromegaly.  Cardiovascular:     Rate and Rhythm: Normal rate and regular  rhythm.     Pulses: Normal pulses.     Heart sounds: Normal heart sounds.   Pulmonary:     Effort: Pulmonary effort is normal. No respiratory distress.     Breath sounds: Normal breath sounds.  Abdominal:     General: Bowel sounds are normal. There is no distension.     Palpations: Abdomen is soft.     Tenderness: There is no abdominal tenderness.  Musculoskeletal:        General: Normal range of motion.     Cervical back: Neck supple.     Right lower leg: No edema.     Left lower leg: No edema.  Lymphadenopathy:     Cervical: No cervical adenopathy.  Skin:    General: Skin is warm and dry.  Neurological:     Mental Status: She is alert. Mental status is at baseline.  Psychiatric:        Mood and Affect: Mood normal.            ASSESSMENT/ PLAN:  TODAY  Failure to thrive in adult: weight is stable at 121 pounds.   2. Hypokalemia: k+ 4.1 will continue k+ 20 meq daily   3. Hypothyroidism unspecified type: tsh 3.485 is on synthroid 25 mcg daily   4. Hypertension associated with stage 3b chronic kidney disease due to type 2 diabetes mellitus: b/p 120/80 will continue cozaar 50 mg daily asa 81 mg daily   PREVIOUS    5. Diabetes mellitus type 2 with stage 3 chronic kidney disease and hypertension: hgb a1c 5.4 is on arb and statin   6. Post menopausal osteoporosis: t score -3.758 will continue fosamax 70 mg weekly and supplements   7. Thrombocytopenia: plt 154 will monitor  8. Multiple myeloma without achieving remission: is followed by oncology  9. Aortic atherosclerosis (ct 08-25-14) is on statin and asa   10. First degree av block/bradycardia: seen by cardiology no further interventions.   11. Pancreatic insufficiency: will continue questran 8 gm twice daily; creon 180,000 units with meals; and imodium 2 mg three times daily   12. Macrocytic anemia: hgb 11.8; vitamin B 12: 620; is off iron due to diarrhea  13. Vascular dementia with behavioral disturbance: weight is 121; pounds will continue buspar 20 mg three times daily for anxiety; is off  aricept  14. Herpes: no recent outbreak; is on acyclovir 400 mg twice daily     Ok Edwards NP [facility]  call [phone]

## 2023-03-04 ENCOUNTER — Inpatient Hospital Stay: Payer: Medicare PPO

## 2023-03-04 DIAGNOSIS — C9000 Multiple myeloma not having achieved remission: Secondary | ICD-10-CM

## 2023-03-04 DIAGNOSIS — Z5112 Encounter for antineoplastic immunotherapy: Secondary | ICD-10-CM | POA: Diagnosis not present

## 2023-03-04 LAB — CBC WITH DIFFERENTIAL/PLATELET
Abs Immature Granulocytes: 0.01 10*3/uL (ref 0.00–0.07)
Basophils Absolute: 0 10*3/uL (ref 0.0–0.1)
Basophils Relative: 0 %
Eosinophils Absolute: 0.1 10*3/uL (ref 0.0–0.5)
Eosinophils Relative: 2 %
HCT: 34.7 % — ABNORMAL LOW (ref 36.0–46.0)
Hemoglobin: 11.5 g/dL — ABNORMAL LOW (ref 12.0–15.0)
Immature Granulocytes: 0 %
Lymphocytes Relative: 12 %
Lymphs Abs: 0.7 10*3/uL (ref 0.7–4.0)
MCH: 33 pg (ref 26.0–34.0)
MCHC: 33.1 g/dL (ref 30.0–36.0)
MCV: 99.4 fL (ref 80.0–100.0)
Monocytes Absolute: 0.6 10*3/uL (ref 0.1–1.0)
Monocytes Relative: 12 %
Neutro Abs: 4 10*3/uL (ref 1.7–7.7)
Neutrophils Relative %: 74 %
Platelets: 89 10*3/uL — ABNORMAL LOW (ref 150–400)
RBC: 3.49 MIL/uL — ABNORMAL LOW (ref 3.87–5.11)
RDW: 15.3 % (ref 11.5–15.5)
WBC: 5.4 10*3/uL (ref 4.0–10.5)
nRBC: 0 % (ref 0.0–0.2)

## 2023-03-04 LAB — COMPREHENSIVE METABOLIC PANEL
ALT: 26 U/L (ref 0–44)
AST: 36 U/L (ref 15–41)
Albumin: 3.3 g/dL — ABNORMAL LOW (ref 3.5–5.0)
Alkaline Phosphatase: 56 U/L (ref 38–126)
Anion gap: 7 (ref 5–15)
BUN: 21 mg/dL (ref 8–23)
CO2: 24 mmol/L (ref 22–32)
Calcium: 8.5 mg/dL — ABNORMAL LOW (ref 8.9–10.3)
Chloride: 107 mmol/L (ref 98–111)
Creatinine, Ser: 1.52 mg/dL — ABNORMAL HIGH (ref 0.44–1.00)
GFR, Estimated: 35 mL/min — ABNORMAL LOW (ref >60)
Glucose, Bld: 158 mg/dL — ABNORMAL HIGH (ref 70–99)
Potassium: 3.9 mmol/L (ref 3.5–5.1)
Sodium: 138 mmol/L (ref 135–145)
Total Bilirubin: 0.5 mg/dL (ref 0.3–1.2)
Total Protein: 6.5 g/dL (ref 6.5–8.1)

## 2023-03-04 LAB — MAGNESIUM: Magnesium: 2.3 mg/dL (ref 1.7–2.4)

## 2023-03-05 ENCOUNTER — Inpatient Hospital Stay: Payer: Medicare PPO

## 2023-03-05 LAB — KAPPA/LAMBDA LIGHT CHAINS
Kappa free light chain: 24 mg/L — ABNORMAL HIGH (ref 3.3–19.4)
Kappa, lambda light chain ratio: 0.78 (ref 0.26–1.65)
Lambda free light chains: 30.9 mg/L — ABNORMAL HIGH (ref 5.7–26.3)

## 2023-03-06 ENCOUNTER — Inpatient Hospital Stay: Payer: Medicare PPO

## 2023-03-06 VITALS — BP 145/86 | HR 74 | Temp 97.2°F | Resp 16 | Wt 118.9 lb

## 2023-03-06 DIAGNOSIS — Z5112 Encounter for antineoplastic immunotherapy: Secondary | ICD-10-CM | POA: Diagnosis not present

## 2023-03-06 DIAGNOSIS — C9000 Multiple myeloma not having achieved remission: Secondary | ICD-10-CM

## 2023-03-06 LAB — PROTEIN ELECTROPHORESIS, SERUM
A/G Ratio: 1.1 (ref 0.7–1.7)
Albumin ELP: 3.1 g/dL (ref 2.9–4.4)
Alpha-1-Globulin: 0.2 g/dL (ref 0.0–0.4)
Alpha-2-Globulin: 0.7 g/dL (ref 0.4–1.0)
Beta Globulin: 0.7 g/dL (ref 0.7–1.3)
Gamma Globulin: 1.2 g/dL (ref 0.4–1.8)
Globulin, Total: 2.8 g/dL (ref 2.2–3.9)
M-Spike, %: 0.6 g/dL — ABNORMAL HIGH
Total Protein ELP: 5.9 g/dL — ABNORMAL LOW (ref 6.0–8.5)

## 2023-03-06 MED ORDER — BORTEZOMIB CHEMO SQ INJECTION 3.5 MG (2.5MG/ML)
1.3000 mg/m2 | Freq: Once | INTRAMUSCULAR | Status: AC
  Administered 2023-03-06: 2 mg via SUBCUTANEOUS
  Filled 2023-03-06: qty 0.8

## 2023-03-06 MED ORDER — DEXAMETHASONE 4 MG PO TABS
10.0000 mg | ORAL_TABLET | Freq: Once | ORAL | Status: AC
  Administered 2023-03-06: 10 mg via ORAL
  Filled 2023-03-06: qty 3

## 2023-03-06 NOTE — Progress Notes (Signed)
Labs meet parameters for treatment today.   Treatment given per orders. Patient tolerated it well without problems. Vitals stable and discharged home from clinic via wheelchair. Follow up as scheduled.

## 2023-03-06 NOTE — Patient Instructions (Signed)
[facility] [place]  Discharge Instructions: Thank you for choosing [facility] to provide your oncology and hematology care.  If you have a lab appointment with the Cancer Center, please come in thru the Main Entrance and check in at the main information desk.  Wear comfortable clothing and clothing appropriate for easy access to any Portacath or PICC line.   We strive to give you quality time with your provider. You may need to reschedule your appointment if you arrive late (15 or more minutes).  Arriving late affects you and other patients whose appointments are after yours.  Also, if you miss three or more appointments without notifying the office, you may be dismissed from the clinic at the provider's discretion.      For prescription refill requests, have your pharmacy contact our office and allow 72 hours for refills to be completed.    Today you received the following chemotherapy and/or immunotherapy agents velcade    To help prevent nausea and vomiting after your treatment, we encourage you to take your nausea medication as directed.  BELOW ARE SYMPTOMS THAT SHOULD BE REPORTED IMMEDIATELY: *FEVER GREATER THAN 100.4 F (38 C) OR HIGHER *CHILLS OR SWEATING *NAUSEA AND VOMITING THAT IS NOT CONTROLLED WITH YOUR NAUSEA MEDICATION *UNUSUAL SHORTNESS OF BREATH *UNUSUAL BRUISING OR BLEEDING *URINARY PROBLEMS (pain or burning when urinating, or frequent urination) *BOWEL PROBLEMS (unusual diarrhea, constipation, pain near the anus) TENDERNESS IN MOUTH AND THROAT WITH OR WITHOUT PRESENCE OF ULCERS (sore throat, sores in mouth, or a toothache) UNUSUAL RASH, SWELLING OR PAIN  UNUSUAL VAGINAL DISCHARGE OR ITCHING   Items with * indicate a potential emergency and should be followed up as soon as possible or go to the Emergency Department if any problems should occur.  Please show the CHEMOTHERAPY ALERT CARD or IMMUNOTHERAPY ALERT CARD at check-in to the Emergency  Department and triage nurse.  Should you have questions after your visit or need to cancel or reschedule your appointment, please contact [facility] [phone]  and follow the prompts.  Office hours are 8:00 a.m. to 4:30 p.m. Monday - Friday. Please note that voicemails left after 4:00 p.m. may not be returned until the following business day.  We are closed weekends and major holidays. You have access to a nurse at all times for urgent questions. Please call the main number to the clinic [phone] and follow the prompts.  For any non-urgent questions, you may also contact your provider using MyChart. We now offer e-Visits for anyone 18 and older to request care online for non-urgent symptoms. For details visit [email or web address].   Also download the MyChart app! Go to the app store, search "MyChart", open the app, select [place], and log in with your MyChart username and password.   

## 2023-03-15 DIAGNOSIS — S60221A Contusion of right hand, initial encounter: Secondary | ICD-10-CM | POA: Diagnosis not present

## 2023-03-16 ENCOUNTER — Encounter: Payer: Self-pay | Admitting: Adult Health

## 2023-03-16 ENCOUNTER — Non-Acute Institutional Stay (SKILLED_NURSING_FACILITY): Payer: Medicare PPO | Admitting: Adult Health

## 2023-03-16 DIAGNOSIS — S62662A Nondisplaced fracture of distal phalanx of right middle finger, initial encounter for closed fracture: Secondary | ICD-10-CM | POA: Diagnosis not present

## 2023-03-16 NOTE — Progress Notes (Unsigned)
Location:  [place] Room Number: 148-W Place of Service:  SNF (31)   CODE STATUS: DNR  Allergies  Allergen Reactions   Lotensin [Benazepril]     Chief Complaint  Patient presents with   Acute Visit    Follow up fall.    HPI:  She had a fall yesterday and developed right 3rd finger swelling and bruising. She had an x-ray done which demonstrated a minimal linear lunacy in the base of the mid phalanx compatible with an acute nondisplaced hairline fracture. Today she is denying any pain and is able to move her fingers without any difficulty. The staff reports that there is less swelling and bruising present.   Past Medical History:  Diagnosis Date   Benign hypertension    Cataract    Central nervous system lymphoma    Dementia    Diabetes mellitus with CKD    H/O partial nephrectomy    Hypokalemia    Hypothyroidism    Impaired cognition    Multiple myeloma    Dr Maylon Peppers, [facility]    Past Surgical History:  Procedure Laterality Date   ABDOMINAL HYSTERECTOMY     CRANIOTOMY     for lymphoma   YAG LASER APPLICATION Right [id number]   Procedure: YAG LASER APPLICATION;  Surgeon: [name], MD;  Location: [facility];  Service: Ophthalmology;  Laterality: Right;    Social History   Socioeconomic History   Marital status: Single    Spouse name: Not on file   Number of children: Not on file   Years of education: Not on file   Highest education level: Not on file  Occupational History   Occupation: retired   Tobacco Use   Smoking status: Never   Smokeless tobacco: Never  Vaping Use   Vaping Use: Never used  Substance and Sexual Activity   Alcohol use: No   Drug use: No   Sexual activity: Never  Other Topics Concern   Not on file  Social History Narrative   Long term resident of [facility]    Social Determinants of Health   Financial Resource Strain: [place]  (10/16/2020)   Overall Financial Resource Strain (CARDIA)    Difficulty of Paying  Living Expenses: Not very hard  Food Insecurity: No Food Insecurity (10/16/2020)   Hunger Vital Sign    Worried About Running Out of Food in the Last Year: Never true    Ran Out of Food in the Last Year: Never true  Transportation Needs: No Transportation Needs (10/16/2020)   PRAPARE - Hydrologist (Medical): No    Lack of Transportation (Non-Medical): No  Physical Activity: Inactive (10/16/2020)   Exercise Vital Sign    Days of Exercise per Week: 0 days    Minutes of Exercise per Session: 0 min  Stress: No Stress Concern Present (10/16/2020)   Farmers    Feeling of Stress : Not at all  Social Connections: Moderately Isolated (10/16/2020)   Social Connection and Isolation Panel [NHANES]    Frequency of Communication with Friends and Family: More than three times a week    Frequency of Social Gatherings with Friends and Family: Once a week    Attends Religious Services: More than 4 times per year    Active Member of [employer] or Organizations: No    Attends Archivist Meetings: Never    Marital Status: Never married  Human resources officer  Violence: Not on file   Family History  Problem Relation Age of Onset   Depression Mother    Diabetes Mother    Heart disease Father    Cancer - Prostate Brother    Cancer Brother    Cancer Sister       VITAL SIGNS BP 126/70   Pulse 68   Temp 98.4 F (36.9 C)   Resp 20   Ht 5\' 9"  (1.753 m)   Wt 121 lb 6.4 oz (55.1 kg)   SpO2 98%   BMI 17.93 kg/m   Outpatient Encounter Medications as of 03/16/2023  Medication Sig   acyclovir (ZOVIRAX) 400 MG tablet TAKE 1 TABLET BY MOUTH TWICE DAILY   aspirin EC 81 MG tablet Take 81 mg by mouth daily.   busPIRone (BUSPAR) 10 MG tablet Take 20 mg by mouth 3 (three) times daily. For anxiety   Calcium Carbonate-Vit D-Min (CALCIUM 600+D PLUS MINERALS) 600-400 MG-UNIT CHEW Chew 1 tablet by mouth daily.    cholestyramine (QUESTRAN) 4 g packet Take 4 g by mouth 2 (two) times daily between meals.   LACTOBACILLUS PROBIOTIC PO Take 2 capsules by mouth daily.   levothyroxine (SYNTHROID, LEVOTHROID) 25 MCG tablet Take 25 mcg by mouth daily before breakfast.   lipase/protease/amylase (CREON) 36000 UNITS CPEP capsule 36,000-114,000- 180,000 unit; amt: 1; oral Special Instructions: Take one capsule with snacks if eaten between meals. Twice A Day Between Meals as needed   lipase/protease/amylase (CREON) 36000 UNITS CPEP capsule Take 3 capsules by mouth 3 (three) times daily with meals.   loperamide (IMODIUM A-D) 2 MG tablet Take 2 mg by mouth 3 (three) times daily. For diarrhea   losartan (COZAAR) 50 MG tablet Take 1 tablet (50 mg total) by mouth daily.   NON FORMULARY Dysphagia 2 diet with thin liquids   NON FORMULARY Wanderguard #[id number] to ankle for safety awareness. Check placement and function qshift. Special Instructions: Check placement and function qshift. Every Shift Day, Evening, Night   potassium chloride SA (KLOR-CON) 20 MEQ tablet Take 20 mEq by mouth daily.    [DISCONTINUED] Cholecalciferol (VITAMIN D) 50 MCG (2000 UT) CAPS Take by mouth daily.   Facility-Administered Encounter Medications as of 03/16/2023  Medication   cyanocobalamin ((VITAMIN B-12)) 1000 MCG/ML injection   cyanocobalamin ((VITAMIN B-12)) 1000 MCG/ML injection   denosumab (XGEVA) 120 MG/1.7ML injection   dexamethasone (DECADRON) 4 MG tablet   dexamethasone (DECADRON) 4 MG tablet   diphenhydrAMINE (BENADRYL) 50 MG/ML injection   diphenhydrAMINE (BENADRYL) 50 MG/ML injection   diphenhydrAMINE (BENADRYL) 50 MG/ML injection   epoetin alfa-epbx (RETACRIT) 21308 UNIT/ML injection   epoetin alfa-epbx (RETACRIT) 65784 UNIT/ML injection   epoetin alfa-epbx (RETACRIT) 69629 UNIT/ML injection   fulvestrant (FASLODEX) 250 MG/5ML injection   lanreotide acetate (SOMATULINE DEPOT) 120 MG/0.5ML injection   lanreotide acetate  (SOMATULINE DEPOT) 120 MG/0.5ML injection   lanreotide acetate (SOMATULINE DEPOT) 120 MG/0.5ML injection   lanreotide acetate (SOMATULINE DEPOT) 120 MG/0.5ML injection   lidocaine (PF) (XYLOCAINE) 1 % injection   magnesium sulfate 2 GM/50ML IVPB   octreotide (SANDOSTATIN LAR) 30 MG IM injection   octreotide (SANDOSTATIN LAR) 30 MG IM injection   palonosetron (ALOXI) 0.25 MG/5ML injection   palonosetron (ALOXI) 0.25 MG/5ML injection   palonosetron (ALOXI) 0.25 MG/5ML injection   palonosetron (ALOXI) 0.25 MG/5ML injection   potassium chloride 10 MEQ/100ML IVPB   potassium chloride SA (KLOR-CON M) 20 MEQ CR tablet   potassium chloride SA (KLOR-CON M) 20 MEQ CR tablet   prochlorperazine (  COMPAZINE) 10 MG tablet   prochlorperazine (COMPAZINE) 10 MG tablet     SIGNIFICANT DIAGNOSTIC EXAMS  PREVIOUS   08-10-20: t score: -3.758  03-16-22: chest x-ray:  No radiographic evidence of acute cardiopulmonary disease Mild cardiomegaly  Mild osteopenia Mild osteoarthritis   TODAY  03-15-23: right hand x-ray:  On lateral view: minimal linear lucency in base of mid phalanx 3rd digit compatible with acute nondisplaced hairline fracture versus vascular channel artifact.  Moderate soft tissue swelling about PIP joint 3rd digit Mild osteopenia Mild degree of osteoarthritis      LABS REVIEWED PREVIOUS    03-17-22: wbc 3.6; hgb 10.9; hct 34.7; mcv 97.2 plt 121; glucose 140; bun 20; creat 1.16; k+ 3.6; na++ 144; ca 9.2 GFR 49; protein 6.7; albumin 3.4 ast 69 03-17-22: wbc 4.1; hgb 10.9; hct 32.5; mcv 96.2 plt 98; glucose 96; bbun 22; creat 1.17; k+ 3.5; na++ 142; ca 9.0; GFR 48 d-dimer 1.67; CRP 1.2  03-24-22: d-dimer 0.72 05-13-22: wbc 5.2; hgb 10.7; hct 32.6; mcv 98.5 plt 148; glucose 85; bun 21; creat 1.31; k+ 3.4; na++ 141; ca 9.0; gfr 42; protein 6.5; albumin 3.3  05-15-22: hgb a1c 5.4 05-20-22: wbc 4.5; hgb 11.6; hct 35.6; mcv 97.8 plt 124 06-09-22: wbc 4.7; hgb 10.3; hct 32.0; mcv 97.6 plt 164; glucose  94; bun 23; creat 1.11; k+ 3.8; na++ 141; ca 9.1; gfr 51 protein 6.2 albumin 3.2 tah 3.485 06-25-22: wbc 4.2; hgb 10.7; hct 33.8; mcv 98.3 plt 110; glucose 97; bun 21; creat 1.10; k+ 3.6; na++ 140; ca 8.9; gfr 52; protein 6.4; albumin 3.3 08-06-22: wbc 4.4; hgb 11.0; hct 34.5; mcv 99.4 plt 130; glucose 138; bun 24; creat 1.38; k+ 4.1; na++ 141; ca 8.9; gfr 39; protein 6.5; albumin 3.2; mag 2.3 09-09-22: wbc 5.1; hgb 11.3; hct 34.5; mcv 99.4 plt 132; glucose 119; bun 22; creat 1.29; k+ 4.1; na++ 143 ca 8.8; gfr 42; protein 6.6 albumin 3.2; mag 2.2   10-09-22: wbc 4.3; hgb 11.3; hct 35.0; mcv 101.7 plt 93; glucose 147; bun 21; creat 1.15; k+ 3.6; na++ 146; ca 9.0; gfr 49; protein 6.8; albumin 3.3  12-24-22: wbc 4.9; hgb 11.8; hct 36.1; mcv 101.4 plt 154; glucose 95; bun 21; creat 1.35; k+ 4.1; na++ 141; ca 9.1; gfr 40; mag 2.4; protein 6.7; albumin 3.5  01-19-23: hgb A1c; 5.6; micro/creat ratio: 6  01-21-23: wbc 4.5 hgb 11.7; hct 35.5; mcv 101.7 plt 132; glucose 110; bun 28;creat 1.59; k+ 4.1; na++ 140; ca 8.8; gfr 33; protein 6.7 albumin 3.4 mag 2.5 02-25-23; wbc 3.1; hgb 11.4; hct 34.5; mcv 101.5 plt 88; glucose 116; bun 24; creat 1.52; k+ 4.1; na++ 143; ca 8.7; gfr 35; protein 6.5; albumin 3.2 mag 2.3  NO NEW LABS    Review of Systems  Constitutional:  Negative for malaise/fatigue.  Respiratory:  Negative for cough and shortness of breath.   Cardiovascular:  Negative for chest pain, palpitations and leg swelling.  Gastrointestinal:  Negative for abdominal pain, constipation and heartburn.  Musculoskeletal:  Negative for back pain, joint pain and myalgias.  Skin: Negative.   Neurological:  Negative for dizziness.  Psychiatric/Behavioral:  The patient is not nervous/anxious.     Physical Exam Constitutional:      General: She is not in acute distress.    Appearance: She is underweight. She is not diaphoretic.  Neck:     Thyroid: No thyromegaly.  Cardiovascular:     Rate and Rhythm: Normal rate  and regular rhythm.  Pulses: Normal pulses.     Heart sounds: Normal heart sounds.  Pulmonary:     Effort: Pulmonary effort is normal. No respiratory distress.     Breath sounds: Normal breath sounds.  Abdominal:     General: Bowel sounds are normal. There is no distension.     Palpations: Abdomen is soft.     Tenderness: There is no abdominal tenderness.  Musculoskeletal:        General: Normal range of motion.     Cervical back: Neck supple.     Right lower leg: No edema.     Left lower leg: No edema.     Comments: Has mild swelling right 3rd finger with bruising present   Lymphadenopathy:     Cervical: No cervical adenopathy.  Skin:    General: Skin is warm and dry.  Neurological:     Mental Status: She is alert. Mental status is at baseline.  Psychiatric:        Mood and Affect: Mood normal.       ASSESSMENT/ PLAN:  TODAY  Nondisplaced fracture of distal phalanx of right middle finger nondisplaced; initial encounter:  will not make changes at this time; will treat pain as indicated and will monitor her status.    Ok Edwards NP [facility]  call [phone]

## 2023-03-17 ENCOUNTER — Other Ambulatory Visit (HOSPITAL_COMMUNITY)
Admission: RE | Admit: 2023-03-17 | Discharge: 2023-03-17 | Disposition: A | Discharge status: Home / Self Care | Payer: Medicare PPO | Source: Skilled Nursing Facility | Attending: Adult Health | Admitting: Adult Health

## 2023-03-17 DIAGNOSIS — S62662A Nondisplaced fracture of distal phalanx of right middle finger, initial encounter for closed fracture: Secondary | ICD-10-CM | POA: Insufficient documentation

## 2023-03-17 DIAGNOSIS — C9000 Multiple myeloma not having achieved remission: Secondary | ICD-10-CM | POA: Insufficient documentation

## 2023-03-17 LAB — CBC WITH DIFFERENTIAL/PLATELET
Abs Immature Granulocytes: 0.01 10*3/uL (ref 0.00–0.07)
Basophils Absolute: 0 10*3/uL (ref 0.0–0.1)
Basophils Relative: 1 %
Eosinophils Absolute: 0.1 10*3/uL (ref 0.0–0.5)
Eosinophils Relative: 2 %
HCT: 34.8 % — ABNORMAL LOW (ref 36.0–46.0)
Hemoglobin: 11.3 g/dL — ABNORMAL LOW (ref 12.0–15.0)
Immature Granulocytes: 0 %
Lymphocytes Relative: 13 %
Lymphs Abs: 0.6 10*3/uL — ABNORMAL LOW (ref 0.7–4.0)
MCH: 32.8 pg (ref 26.0–34.0)
MCHC: 32.5 g/dL (ref 30.0–36.0)
MCV: 101.2 fL — ABNORMAL HIGH (ref 80.0–100.0)
Monocytes Absolute: 0.4 10*3/uL (ref 0.1–1.0)
Monocytes Relative: 8 %
Neutro Abs: 3.5 10*3/uL (ref 1.7–7.7)
Neutrophils Relative %: 76 %
Platelets: 129 10*3/uL — ABNORMAL LOW (ref 150–400)
RBC: 3.44 MIL/uL — ABNORMAL LOW (ref 3.87–5.11)
RDW: 15.5 % (ref 11.5–15.5)
WBC: 4.7 10*3/uL (ref 4.0–10.5)
nRBC: 0 % (ref 0.0–0.2)

## 2023-03-17 LAB — COMPREHENSIVE METABOLIC PANEL
ALT: 27 U/L (ref 0–44)
AST: 39 U/L (ref 15–41)
Albumin: 3.5 g/dL (ref 3.5–5.0)
Alkaline Phosphatase: 64 U/L (ref 38–126)
Anion gap: 9 (ref 5–15)
BUN: 26 mg/dL — ABNORMAL HIGH (ref 8–23)
CO2: 21 mmol/L — ABNORMAL LOW (ref 22–32)
Calcium: 8.9 mg/dL (ref 8.9–10.3)
Chloride: 108 mmol/L (ref 98–111)
Creatinine, Ser: 1.37 mg/dL — ABNORMAL HIGH (ref 0.44–1.00)
GFR, Estimated: 40 mL/min — ABNORMAL LOW (ref >60)
Glucose, Bld: 207 mg/dL — ABNORMAL HIGH (ref 70–99)
Potassium: 3.7 mmol/L (ref 3.5–5.1)
Sodium: 138 mmol/L (ref 135–145)
Total Bilirubin: 0.6 mg/dL (ref 0.3–1.2)
Total Protein: 6.8 g/dL (ref 6.5–8.1)

## 2023-03-18 ENCOUNTER — Inpatient Hospital Stay: Payer: Medicare PPO

## 2023-03-18 ENCOUNTER — Other Ambulatory Visit: Payer: Medicare PPO

## 2023-03-18 NOTE — Progress Notes (Signed)
[place] [address], [place] [address]    Clinic Day:  03/19/2023  Referring physician: Gerlene Fee, NP  Patient Care Team: Gerlene Fee, NP as PCP - General ([place], [place] ([place]) Eloise Harman, DO as Consulting Physician (Internal Medicine)   ASSESSMENT & PLAN:   Assessment: 1.  IgG lambda multiple myeloma: -She is on Revlimid 10 mg 2 weeks on/2 weeks of along with Velcade 3 weeks on/1 week off.  Dexamethasone 10 mg on days of Velcade. -Myeloma panel on 04/17/2020 shows M spike 0.5 g.  Kappa light chains are 44.  Lambda light chains are 34.  Ratio is 1.3. -Resident of [facility] since 07/19/2020.   2.  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   3.  Myeloma bone disease: - She has received Zometa for several years in the past which was discontinued.   Plan: 1.  IgG lambda multiple myeloma: - She is tolerating Velcade and low-dose dexamethasone very well. - Reviewed myeloma labs from 02/22/2023: M spike is 0.6, improved from 0.7 previously.  Free light chain ratio is normal at 0.78.  Kappa light chains are 24 and lambda light chains 30.9. - Labs today shows normal LFTs.  Creatinine stable at 1.37.  Mild thrombocytopenia is also stable at 129. - Continue Velcade 3 weeks on/1 week off with low-dose dexamethasone 10 mg during days of Velcade. - RTC 3 months for follow-up with myeloma labs.   2.  CKD: - Baseline creatinine stable between 1.2-1.5.   3.  Diarrhea: - She denies any diarrhea at this time.   4.  Dementia: - Continue Aricept 10 mg daily.   5.  Shingles prophylaxis: - Continue acyclovir 400 mg twice daily.  Orders Placed This Encounter  Procedures   Magnesium    Standing Status:   Future    Standing Expiration Date:   07/08/2024   CBC with Differential    Standing Status:   Future    Standing Expiration Date:   07/09/2024   Comprehensive  metabolic panel    Standing Status:   Future    Standing Expiration Date:   07/09/2024   Magnesium    Standing Status:   Future    Standing Expiration Date:   07/15/2024   CBC with Differential    Standing Status:   Future    Standing Expiration Date:   07/15/2024   Comprehensive metabolic panel    Standing Status:   Future    Standing Expiration Date:   07/15/2024   Magnesium    Standing Status:   Future    Standing Expiration Date:   07/22/2024   CBC with Differential    Standing Status:   Future    Standing Expiration Date:   07/22/2024   Comprehensive metabolic panel    Standing Status:   Future    Standing Expiration Date:   07/22/2024   Magnesium    Standing Status:   Future    Standing Expiration Date:   08/05/2024   CBC with Differential    Standing Status:   Future    Standing Expiration Date:   08/06/2024   Comprehensive metabolic panel    Standing Status:   Future    Standing Expiration Date:   08/06/2024   Magnesium    Standing Status:   Future    Standing Expiration Date:   08/12/2024   CBC with Differential  Standing Status:   Future    Standing Expiration Date:   08/12/2024   Comprehensive metabolic panel    Standing Status:   Future    Standing Expiration Date:   08/12/2024   Magnesium    Standing Status:   Future    Standing Expiration Date:   08/19/2024   CBC with Differential    Standing Status:   Future    Standing Expiration Date:   08/19/2024   Comprehensive metabolic panel    Standing Status:   Future    Standing Expiration Date:   08/19/2024   Magnesium    Standing Status:   Future    Standing Expiration Date:   09/02/2024   CBC with Differential    Standing Status:   Future    Standing Expiration Date:   09/03/2024   Comprehensive metabolic panel    Standing Status:   Future    Standing Expiration Date:   09/03/2024   Magnesium    Standing Status:   Future    Standing Expiration Date:   09/09/2024   CBC with Differential    Standing Status:   Future     Standing Expiration Date:   09/09/2024   Comprehensive metabolic panel    Standing Status:   Future    Standing Expiration Date:   09/09/2024   Magnesium    Standing Status:   Future    Standing Expiration Date:   09/16/2024   CBC with Differential    Standing Status:   Future    Standing Expiration Date:   09/16/2024   Comprehensive metabolic panel    Standing Status:   Future    Standing Expiration Date:   09/16/2024      I,Katie Daubenspeck,acting as a scribe for Derek Jack, MD.,have documented all relevant documentation on the behalf of Derek Jack, MD,as directed by  Derek Jack, MD while in the presence of Derek Jack, MD.   I, Derek Jack MD, have reviewed the above documentation for accuracy and completeness, and I agree with the above.   Derek Jack, MD   4/4/20246:58 PM  CHIEF COMPLAINT:   Diagnosis: multiple myeloma    Cancer Staging  No matching staging information was found for the patient.   Prior Therapy: none  Current Therapy:  Velcade & Decadron 3/4 weeks; Revlimid 2/4 weeks    HISTORY OF PRESENT ILLNESS:   Oncology History  Multiple myeloma  07/09/2017 Initial Diagnosis   Multiple myeloma ([place])   06/01/2018 - 08/13/2022 Chemotherapy   Patient is on Treatment Plan : MYELOMA MAINTENANCE Bortezomib SQ D1,8,15 / Dexamethasone Oral D1,8,15 q28d      08/20/2022 -  Chemotherapy   Patient is on Treatment Plan : MYELOMA MAINTENANCE Bortezomib SQ D1,8,15 + Dexamethasone D1,8,15 q21d x 6 cycles        INTERVAL HISTORY:   Amanda Wu is a 77 y.o. female presenting to clinic today for follow up of multiple myeloma. She was last seen by me on 12/25/22.  Today, she states that she is doing well overall. Her appetite level is at 75%. Her energy level is at 70%.  PAST MEDICAL HISTORY:   Past Medical History: Past Medical History:  Diagnosis Date   Benign hypertension    Cataract    Central nervous system lymphoma     Dementia    Diabetes mellitus with CKD    H/O partial nephrectomy    Hypokalemia    Hypothyroidism    Impaired cognition    Multiple  myeloma    Dr Maylon Peppers, [facility]    Surgical History: Past Surgical History:  Procedure Laterality Date   ABDOMINAL HYSTERECTOMY     CRANIOTOMY     for lymphoma   YAG LASER APPLICATION Right [id number]   Procedure: YAG LASER APPLICATION;  Surgeon: [name], MD;  Location: [facility];  Service: Ophthalmology;  Laterality: Right;    Social History: Social History   Socioeconomic History   Marital status: Single    Spouse name: Not on file   Number of children: Not on file   Years of education: Not on file   Highest education level: Not on file  Occupational History   Occupation: retired   Tobacco Use   Smoking status: Never   Smokeless tobacco: Never  Vaping Use   Vaping Use: Never used  Substance and Sexual Activity   Alcohol use: No   Drug use: No   Sexual activity: Never  Other Topics Concern   Not on file  Social History Narrative   Long term resident of [facility]    Social Determinants of Health   Financial Resource Strain: [place]  (10/16/2020)   Overall Financial Resource Strain (CARDIA)    Difficulty of Paying Living Expenses: Not very hard  Food Insecurity: No Food Insecurity (10/16/2020)   Hunger Vital Sign    Worried About Running Out of Food in the Last Year: Never true    Ran Out of Food in the Last Year: Never true  Transportation Needs: No Transportation Needs (10/16/2020)   PRAPARE - Hydrologist (Medical): No    Lack of Transportation (Non-Medical): No  Physical Activity: Inactive (10/16/2020)   Exercise Vital Sign    Days of Exercise per Week: 0 days    Minutes of Exercise per Session: 0 min  Stress: No Stress Concern Present (10/16/2020)   [place]    Feeling of Stress : Not at all  Social Connections: Moderately Isolated  (10/16/2020)   Social Connection and Isolation Panel [NHANES]    Frequency of Communication with Friends and Family: More than three times a week    Frequency of Social Gatherings with Friends and Family: Once a week    Attends Religious Services: More than 4 times per year    Active Member of [employer] or Organizations: No    Attends Music therapist: Never    Marital Status: Never married  Human resources officer Violence: Not on file    Family History: Family History  Problem Relation Age of Onset   Depression Mother    Diabetes Mother    Heart disease Father    Cancer - Prostate Brother    Cancer Brother    Cancer Sister     Current Medications:  Current Outpatient Medications:    acyclovir (ZOVIRAX) 400 MG tablet, TAKE 1 TABLET BY MOUTH TWICE DAILY, Disp: 60 tablet, Rfl: 2   aspirin EC 81 MG tablet, Take 81 mg by mouth daily., Disp: , Rfl:    busPIRone (BUSPAR) 10 MG tablet, Take 20 mg by mouth 3 (three) times daily. For anxiety, Disp: , Rfl:    Calcium Carbonate-Vit D-Min (CALCIUM 600+D PLUS MINERALS) 600-400 MG-UNIT CHEW, Chew 1 tablet by mouth daily., Disp: , Rfl:    cholestyramine (QUESTRAN) 4 g packet, Take 4 g by mouth 2 (two) times daily between meals., Disp: , Rfl:    LACTOBACILLUS PROBIOTIC PO, Take 2 capsules by mouth daily.,  Disp: , Rfl:    levothyroxine (SYNTHROID, LEVOTHROID) 25 MCG tablet, Take 25 mcg by mouth daily before breakfast., Disp: , Rfl:    lipase/protease/amylase (CREON) 36000 UNITS CPEP capsule, 36,000-114,000- 180,000 unit; amt: 1; oral Special Instructions: Take one capsule with snacks if eaten between meals. Twice A Day Between Meals as needed, Disp: , Rfl:    lipase/protease/amylase (CREON) 36000 UNITS CPEP capsule, Take 3 capsules by mouth 3 (three) times daily with meals., Disp: , Rfl:    loperamide (IMODIUM A-D) 2 MG tablet, Take 2 mg by mouth 3 (three) times daily. For diarrhea, Disp: , Rfl:    losartan (COZAAR) 50 MG tablet, Take 1 tablet  (50 mg total) by mouth daily., Disp: 30 tablet, Rfl: 4   NON FORMULARY, Dysphagia 2 diet with thin liquids, Disp: , Rfl:    NON FORMULARY, [employer] #[id number] to ankle for safety awareness. Check placement and function qshift. Special Instructions: Check placement and function qshift. Every Shift Day, Evening, Night, Disp: , Rfl:    potassium chloride SA (KLOR-CON) 20 MEQ tablet, Take 20 mEq by mouth daily. , Disp: , Rfl:  No current facility-administered medications for this visit.  Facility-Administered Medications Ordered in Other Visits:    cyanocobalamin ((VITAMIN B-12)) 1000 MCG/ML injection, , , ,    cyanocobalamin ((VITAMIN B-12)) 1000 MCG/ML injection, , , ,    denosumab (XGEVA) 120 MG/1.7ML injection, , , ,    dexamethasone (DECADRON) 4 MG tablet, , , ,    dexamethasone (DECADRON) 4 MG tablet, , , ,    diphenhydrAMINE (BENADRYL) 50 MG/ML injection, , , ,    diphenhydrAMINE (BENADRYL) 50 MG/ML injection, , , ,    diphenhydrAMINE (BENADRYL) 50 MG/ML injection, , , ,    epoetin alfa-epbx (RETACRIT) 57846 UNIT/ML injection, , , ,    epoetin alfa-epbx (RETACRIT) 96295 UNIT/ML injection, , , ,    epoetin alfa-epbx (RETACRIT) 28413 UNIT/ML injection, , , ,    fulvestrant (FASLODEX) 250 MG/5ML injection, , , ,    lanreotide acetate (SOMATULINE DEPOT) 120 MG/0.5ML injection, , , ,    lanreotide acetate (SOMATULINE DEPOT) 120 MG/0.5ML injection, , , ,    lanreotide acetate (SOMATULINE DEPOT) 120 MG/0.5ML injection, , , ,    lanreotide acetate (SOMATULINE DEPOT) 120 MG/0.5ML injection, , , ,    lidocaine (PF) (XYLOCAINE) 1 % injection, , , ,    magnesium sulfate 2 GM/50ML IVPB, , , ,    octreotide (SANDOSTATIN LAR) 30 MG IM injection, , , ,    octreotide (SANDOSTATIN LAR) 30 MG IM injection, , , ,    palonosetron (ALOXI) 0.25 MG/5ML injection, , , ,    palonosetron (ALOXI) 0.25 MG/5ML injection, , , ,    palonosetron (ALOXI) 0.25 MG/5ML injection, , , ,    palonosetron (ALOXI) 0.25 MG/5ML  injection, , , ,    potassium chloride 10 MEQ/100ML IVPB, , , ,    potassium chloride SA (KLOR-CON M) 20 MEQ CR tablet, , , ,    potassium chloride SA (KLOR-CON M) 20 MEQ CR tablet, , , ,    prochlorperazine (COMPAZINE) 10 MG tablet, , , ,    prochlorperazine (COMPAZINE) 10 MG tablet, , , ,    Allergies: Allergies  Allergen Reactions   Lotensin [Benazepril]     REVIEW OF SYSTEMS:   Review of Systems  Constitutional:  Negative for chills, fatigue and fever.  HENT:   Negative for lump/mass, mouth sores, nosebleeds, sore throat and  trouble swallowing.   Eyes:  Negative for eye problems.  Respiratory:  Negative for cough and shortness of breath.   Cardiovascular:  Negative for chest pain, leg swelling and palpitations.  Gastrointestinal:  Negative for abdominal pain, constipation, diarrhea, nausea and vomiting.  Genitourinary:  Negative for bladder incontinence, difficulty urinating, dysuria, frequency, hematuria and nocturia.   Musculoskeletal:  Negative for arthralgias, back pain, flank pain, myalgias and neck pain.  Skin:  Negative for itching and rash.  Neurological:  Negative for dizziness, headaches and numbness.  Hematological:  Does not bruise/bleed easily.  Psychiatric/Behavioral:  Negative for depression, sleep disturbance and suicidal ideas. The patient is not nervous/anxious.   All other systems reviewed and are negative.    VITALS:   Blood pressure 134/80, pulse 75, temperature 97.7 F (36.5 C), temperature source Tympanic, resp. rate 18, weight 118 lb 11.2 oz (53.8 kg), SpO2 100 %.  Wt Readings from Last 3 Encounters:  03/19/23 118 lb 11.2 oz (53.8 kg)  03/16/23 121 lb 6.4 oz (55.1 kg)  03/06/23 118 lb 14.4 oz (53.9 kg)    Body mass index is 17.53 kg/m.  Performance status (ECOG): 1 - Symptomatic but completely ambulatory  PHYSICAL EXAM:   Physical Exam Vitals and nursing note reviewed. Exam conducted with a chaperone present.  Constitutional:       Appearance: Normal appearance.  Cardiovascular:     Rate and Rhythm: Normal rate and regular rhythm.     Pulses: Normal pulses.     Heart sounds: Normal heart sounds.  Pulmonary:     Effort: Pulmonary effort is normal.     Breath sounds: Normal breath sounds.  Abdominal:     Palpations: Abdomen is soft. There is no hepatomegaly, splenomegaly or mass.     Tenderness: There is no abdominal tenderness.  Musculoskeletal:     Right lower leg: No edema.     Left lower leg: No edema.  Lymphadenopathy:     Cervical: No cervical adenopathy.     Right cervical: No superficial, deep or posterior cervical adenopathy.    Left cervical: No superficial, deep or posterior cervical adenopathy.     Upper Body:     Right upper body: No supraclavicular or axillary adenopathy.     Left upper body: No supraclavicular or axillary adenopathy.  Neurological:     General: No focal deficit present.     Mental Status: She is alert and oriented to person, place, and time.  Psychiatric:        Mood and Affect: Mood normal.        Behavior: Behavior normal.     LABS:      Latest Ref Rng & Units 03/17/2023    1:01 PM 03/04/2023   11:29 AM 02/25/2023   11:01 AM  CBC  WBC 4.0 - 10.5 K/uL 4.7  5.4  3.1   Hemoglobin 12.0 - 15.0 g/dL 11.3  11.5  11.4   Hematocrit 36.0 - 46.0 % 34.8  34.7  34.6   Platelets 150 - 400 K/uL 129  89  88       Latest Ref Rng & Units 03/17/2023    1:01 PM 03/04/2023   11:29 AM 02/25/2023   11:01 AM  CMP  Glucose 70 - 99 mg/dL 207  158  116   BUN 8 - 23 mg/dL 26  21  24    Creatinine 0.44 - 1.00 mg/dL 1.37  1.52  1.52   Sodium 135 - 145 mmol/L 138  138  143   Potassium 3.5 - 5.1 mmol/L 3.7  3.9  4.1   Chloride 98 - 111 mmol/L 108  107  109   CO2 22 - 32 mmol/L 21  24  25    Calcium 8.9 - 10.3 mg/dL 8.9  8.5  8.7   Total Protein 6.5 - 8.1 g/dL 6.8  6.5  6.5   Total Bilirubin 0.3 - 1.2 mg/dL 0.6  0.5  0.6   Alkaline Phos 38 - 126 U/L 64  56  52   AST 15 - 41 U/L 39  36  34   ALT  0 - 44 U/L 27  26  19       No results found for: "CEA1", "CEA" / No results found for: "CEA1", "CEA" No results found for: "PSA1" No results found for: "[id number]" No results found for: "CAN125"  Lab Results  Component Value Date   TOTALPROTELP 5.9 (L) 03/04/2023   ALBUMINELP 3.1 03/04/2023   A1GS 0.2 03/04/2023   A2GS 0.7 03/04/2023   BETS 0.7 03/04/2023   GAMS 1.2 03/04/2023   MSPIKE 0.6 (H) 03/04/2023   SPEI Comment 03/04/2023   Lab Results  Component Value Date   TIBC 217 (L) 06/09/2022   FERRITIN 35 06/09/2022   IRONPCTSAT 19 06/09/2022   Lab Results  Component Value Date   LDH 225 (H) 10/02/2022   LDH 208 (H) 07/09/2022   LDH 202 (H) 06/09/2022     STUDIES:   No results found.

## 2023-03-19 ENCOUNTER — Inpatient Hospital Stay: Payer: Medicare PPO

## 2023-03-19 ENCOUNTER — Other Ambulatory Visit: Payer: Self-pay

## 2023-03-19 ENCOUNTER — Inpatient Hospital Stay: Payer: Medicare PPO | Attending: Hematology | Admitting: Hematology

## 2023-03-19 VITALS — BP 134/80 | HR 75 | Temp 97.7°F | Resp 18 | Wt 118.7 lb

## 2023-03-19 DIAGNOSIS — F0390 Unspecified dementia without behavioral disturbance: Secondary | ICD-10-CM | POA: Insufficient documentation

## 2023-03-19 DIAGNOSIS — Z79624 Long term (current) use of inhibitors of nucleotide synthesis: Secondary | ICD-10-CM | POA: Insufficient documentation

## 2023-03-19 DIAGNOSIS — Z7982 Long term (current) use of aspirin: Secondary | ICD-10-CM | POA: Diagnosis not present

## 2023-03-19 DIAGNOSIS — C9000 Multiple myeloma not having achieved remission: Secondary | ICD-10-CM

## 2023-03-19 DIAGNOSIS — Z5112 Encounter for antineoplastic immunotherapy: Secondary | ICD-10-CM | POA: Insufficient documentation

## 2023-03-19 DIAGNOSIS — N189 Chronic kidney disease, unspecified: Secondary | ICD-10-CM | POA: Insufficient documentation

## 2023-03-19 DIAGNOSIS — E1122 Type 2 diabetes mellitus with diabetic chronic kidney disease: Secondary | ICD-10-CM | POA: Diagnosis not present

## 2023-03-19 DIAGNOSIS — I129 Hypertensive chronic kidney disease with stage 1 through stage 4 chronic kidney disease, or unspecified chronic kidney disease: Secondary | ICD-10-CM | POA: Diagnosis not present

## 2023-03-19 DIAGNOSIS — Z79899 Other long term (current) drug therapy: Secondary | ICD-10-CM | POA: Diagnosis not present

## 2023-03-19 DIAGNOSIS — Z7961 Long term (current) use of immunomodulator: Secondary | ICD-10-CM | POA: Insufficient documentation

## 2023-03-19 MED ORDER — BORTEZOMIB CHEMO SQ INJECTION 3.5 MG (2.5MG/ML)
1.3000 mg/m2 | Freq: Once | INTRAMUSCULAR | Status: AC
  Administered 2023-03-19: 2 mg via SUBCUTANEOUS
  Filled 2023-03-19: qty 0.8

## 2023-03-19 MED ORDER — DEXAMETHASONE 4 MG PO TABS
10.0000 mg | ORAL_TABLET | Freq: Once | ORAL | Status: AC
  Administered 2023-03-19: 10 mg via ORAL
  Filled 2023-03-19: qty 3

## 2023-03-19 NOTE — Patient Instructions (Signed)
[facility]  Discharge Instructions: Thank you for choosing [facility] to provide your oncology and hematology care.  If you have a lab appointment with the [facility] - please note that after April 8th, 2024, all labs will be drawn in the cancer center.  You do not have to check in or register with the main entrance as you have in the past but will complete your check-in in the cancer center.  Wear comfortable clothing and clothing appropriate for easy access to any Portacath or PICC line.   We strive to give you quality time with your provider. You may need to reschedule your appointment if you arrive late (15 or more minutes).  Arriving late affects you and other patients whose appointments are after yours.  Also, if you miss three or more appointments without notifying the office, you may be dismissed from the clinic at the provider's discretion.      For prescription refill requests, have your pharmacy contact our office and allow 72 hours for refills to be completed.    Today you received the following chemotherapy and/or immunotherapy agents Velcade   To help prevent nausea and vomiting after your treatment, we encourage you to take your nausea medication as directed.  Bortezomib Injection What is this medication? BORTEZOMIB (bor TEZ oh mib) treats lymphoma. It may also be used to treat multiple myeloma, a type of bone marrow cancer. It works by blocking a protein that causes cancer cells to grow and multiply. This helps to slow or stop the spread of cancer cells. This medicine may be used for other purposes; ask your health care provider or pharmacist if you have questions. COMMON BRAND NAME(S): Velcade What should I tell my care team before I take this medication? They need to know if you have any of these conditions: Dehydration Diabetes Heart disease Liver disease Tingling of the fingers or toes or other nerve disorder An unusual or  allergic reaction to bortezomib, other medications, foods, dyes, or preservatives If you or your partner are pregnant or trying to get pregnant Breastfeeding How should I use this medication? This medication is injected into a vein or under the skin. It is given by your care team in a hospital or clinic setting. Talk to your care team about the use of this medication in children. Special care may be needed. Overdosage: If you think you have taken too much of this medicine contact a poison control center or emergency room at once. NOTE: This medicine is only for you. Do not share this medicine with others. What if I miss a dose? Keep appointments for follow-up doses. It is important not to miss your dose. Call your care team if you are unable to keep an appointment. What may interact with this medication? Ketoconazole Rifampin This list may not describe all possible interactions. Give your health care provider a list of all the medicines, herbs, non-prescription drugs, or dietary supplements you use. Also tell them if you smoke, drink alcohol, or use illegal drugs. Some items may interact with your medicine. What should I watch for while using this medication? Your condition will be monitored carefully while you are receiving this medication. You may need blood work while taking this medication. This medication may affect your coordination, reaction time, or judgment. Do not drive or operate machinery until you know how this medication affects you. Sit up or stand slowly to reduce the risk of dizzy or fainting spells. Drinking alcohol  with this medication can increase the risk of these side effects. This medication may increase your risk of getting an infection. Call your care team for advice if you get a fever, chills, sore throat, or other symptoms of a cold or flu. Do not treat yourself. Try to avoid being around people who are sick. Check with your care team if you have severe diarrhea, nausea,  and vomiting, or if you sweat a lot. The loss of too much body fluid may make it dangerous for you to take this medication. Talk to your care team if you may be pregnant. Serious birth defects can occur if you take this medication during pregnancy and for 7 months after the last dose. You will need a negative pregnancy test before starting this medication. Contraception is recommended while taking this medication and for 7 months after the last dose. Your care team can help you find the option that works for you. If your partner can get pregnant, use a condom during sex while taking this medication and for 4 months after the last dose. Do not breastfeed while taking this medication and for 2 months after the last dose. This medication may cause infertility. Talk to your care team if you are concerned about your fertility. What side effects may I notice from receiving this medication? Side effects that you should report to your care team as soon as possible: Allergic reactions--skin rash, itching, hives, swelling of the face, lips, tongue, or throat Bleeding--bloody or black, tar-like stools, vomiting blood or brown material that looks like coffee grounds, red or dark brown urine, small red or purple spots on skin, unusual bruising or bleeding Bleeding in the brain--severe headache, stiff neck, confusion, dizziness, change in vision, numbness or weakness of the face, arm, or leg, trouble speaking, trouble walking, vomiting Bowel blockage--stomach cramping, unable to have a bowel movement or pass gas, loss of appetite, vomiting Heart failure--shortness of breath, swelling of the ankles, feet, or hands, sudden weight gain, unusual weakness or fatigue Infection--fever, chills, cough, sore throat, wounds that don't heal, pain or trouble when passing urine, general feeling of discomfort or being unwell Liver injury--right upper belly pain, loss of appetite, nausea, light-colored stool, dark yellow or brown  urine, yellowing skin or eyes, unusual weakness or fatigue Low blood pressure--dizziness, feeling faint or lightheaded, blurry vision Lung injury--shortness of breath or trouble breathing, cough, spitting up blood, chest pain, fever Pain, tingling, or numbness in the hands or feet Severe or prolonged diarrhea Stomach pain, bloody diarrhea, pale skin, unusual weakness or fatigue, decrease in the amount of urine, which may be signs of hemolytic uremic syndrome Sudden and severe headache, confusion, change in vision, seizures, which may be signs of posterior reversible encephalopathy syndrome (PRES) TTP--purple spots on the skin or inside the mouth, pale skin, yellowing skin or eyes, unusual weakness or fatigue, fever, fast or irregular heartbeat, confusion, change in vision, trouble speaking, trouble walking Tumor lysis syndrome (TLS)--nausea, vomiting, diarrhea, decrease in the amount of urine, dark urine, unusual weakness or fatigue, confusion, muscle pain or cramps, fast or irregular heartbeat, joint pain Side effects that usually do not require medical attention (report to your care team if they continue or are bothersome): Constipation Diarrhea Fatigue Loss of appetite Nausea This list may not describe all possible side effects. Call your doctor for medical advice about side effects. You may report side effects to FDA at 1-800-FDA-1088. Where should I keep my medication? This medication is given in a hospital or  clinic. It will not be stored at home. NOTE: This sheet is a summary. It may not cover all possible information. If you have questions about this medicine, talk to your doctor, pharmacist, or health care provider.  2023 Elsevier/Gold Standard (2022-04-30 00:00:00)  BELOW ARE SYMPTOMS THAT SHOULD BE REPORTED IMMEDIATELY: *FEVER GREATER THAN 100.4 F (38 C) OR HIGHER *CHILLS OR SWEATING *NAUSEA AND VOMITING THAT IS NOT CONTROLLED WITH YOUR NAUSEA MEDICATION *UNUSUAL SHORTNESS OF  BREATH *UNUSUAL BRUISING OR BLEEDING *URINARY PROBLEMS (pain or burning when urinating, or frequent urination) *BOWEL PROBLEMS (unusual diarrhea, constipation, pain near the anus) TENDERNESS IN MOUTH AND THROAT WITH OR WITHOUT PRESENCE OF ULCERS (sore throat, sores in mouth, or a toothache) UNUSUAL RASH, SWELLING OR PAIN  UNUSUAL VAGINAL DISCHARGE OR ITCHING   Items with * indicate a potential emergency and should be followed up as soon as possible or go to the Emergency Department if any problems should occur.  Please show the CHEMOTHERAPY ALERT CARD or IMMUNOTHERAPY ALERT CARD at check-in to the Emergency Department and triage nurse.  Should you have questions after your visit or need to cancel or reschedule your appointment, please contact [facility] [phone]  and follow the prompts.  Office hours are 8:00 a.m. to 4:30 p.m. Monday - Friday. Please note that voicemails left after 4:00 p.m. may not be returned until the following business day.  We are closed weekends and major holidays. You have access to a nurse at all times for urgent questions. Please call the main number to the clinic [phone] and follow the prompts.  For any non-urgent questions, you may also contact your provider using MyChart. We now offer e-Visits for anyone 65 and older to request care online for non-urgent symptoms. For details visit [email or web address].   Also download the MyChart app! Go to the app store, search "MyChart", open the app, select [facility], and log in with your MyChart username and password.

## 2023-03-19 NOTE — Patient Instructions (Signed)
[facility]  Discharge Instructions  You were seen and examined today by Dr. Delton Coombes.  Dr. Delton Coombes discussed your most recent lab work which is stable.  Proceed with your injections today. You will continue shots 3 weeks on, 1 week off.  Follow-up as scheduled.  Thank you for choosing [facility] to provide your oncology and hematology care.   To afford each patient quality time with our provider, please arrive at least 15 minutes before your scheduled appointment time. You may need to reschedule your appointment if you arrive late (10 or more minutes). Arriving late affects you and other patients whose appointments are after yours.  Also, if you miss three or more appointments without notifying the office, you may be dismissed from the clinic at the provider's discretion.    Again, thank you for choosing [facility].  Our hope is that these requests will decrease the amount of time that you wait before being seen by our physicians.   If you have a lab appointment with the [facility] - please note that after April 8th, all labs will be drawn in the cancer center.  You do not have to check in or register with the main entrance as you have in the past but will complete your check-in at the cancer center.            _____________________________________________________________  Should you have questions after your visit to [facility], please contact our office at [phone] and follow the prompts.  Our office hours are 8:00 a.m. to 4:30 p.m. Monday - Thursday and 8:00 a.m. to 2:30 p.m. Friday.  Please note that voicemails left after 4:00 p.m. may not be returned until the following business day.  We are closed weekends and all major holidays.  You do have access to a nurse 24-7, just call the main number to the clinic [phone] and do not press any options, hold on the line and a nurse will answer the  phone.    For prescription refill requests, have your pharmacy contact our office and allow 72 hours.    Masks are no longer required in the cancer centers. If you would like for your care team to wear a mask while they are taking care of you, please let them know. You may have one support person who is at least 79 years old accompany you for your appointments.

## 2023-03-19 NOTE — Progress Notes (Signed)
Patient presents today for Velcade injection. Patient is in satisfactory condition with no new complaints voiced.  Vital signs are stable.  Labs reviewed by Dr. Delton Coombes during the office visit and all labs are within treatment parameters.  We will proceed with treatment per MD orders.   Velcade given today per MD orders. Tolerated infusion without adverse affects. Vital signs stable. No complaints at this time. Discharged from clinic via wheelchair in stable condition. Alert and oriented x 3. F/U with [facility] as scheduled.

## 2023-03-19 NOTE — Progress Notes (Signed)
Patient has been assessed, vital signs and labs have been reviewed by Dr. Katragadda. ANC, Creatinine, LFTs, and Platelets are within treatment parameters per Dr. Katragadda. The patient is good to proceed with treatment at this time. Primary RN and pharmacy aware.  

## 2023-03-20 ENCOUNTER — Other Ambulatory Visit: Payer: Self-pay

## 2023-03-25 ENCOUNTER — Inpatient Hospital Stay: Payer: Medicare PPO

## 2023-03-26 ENCOUNTER — Inpatient Hospital Stay: Payer: Medicare PPO

## 2023-03-26 VITALS — BP 146/86 | HR 73 | Temp 97.2°F | Resp 18 | Wt 116.4 lb

## 2023-03-26 DIAGNOSIS — I129 Hypertensive chronic kidney disease with stage 1 through stage 4 chronic kidney disease, or unspecified chronic kidney disease: Secondary | ICD-10-CM | POA: Diagnosis not present

## 2023-03-26 DIAGNOSIS — N189 Chronic kidney disease, unspecified: Secondary | ICD-10-CM | POA: Diagnosis not present

## 2023-03-26 DIAGNOSIS — E1122 Type 2 diabetes mellitus with diabetic chronic kidney disease: Secondary | ICD-10-CM | POA: Diagnosis not present

## 2023-03-26 DIAGNOSIS — Z79624 Long term (current) use of inhibitors of nucleotide synthesis: Secondary | ICD-10-CM | POA: Diagnosis not present

## 2023-03-26 DIAGNOSIS — Z7982 Long term (current) use of aspirin: Secondary | ICD-10-CM | POA: Diagnosis not present

## 2023-03-26 DIAGNOSIS — C9000 Multiple myeloma not having achieved remission: Secondary | ICD-10-CM

## 2023-03-26 DIAGNOSIS — F0390 Unspecified dementia without behavioral disturbance: Secondary | ICD-10-CM | POA: Diagnosis not present

## 2023-03-26 DIAGNOSIS — Z5112 Encounter for antineoplastic immunotherapy: Secondary | ICD-10-CM | POA: Diagnosis not present

## 2023-03-26 DIAGNOSIS — Z7961 Long term (current) use of immunomodulator: Secondary | ICD-10-CM | POA: Diagnosis not present

## 2023-03-26 MED ORDER — BORTEZOMIB CHEMO SQ INJECTION 3.5 MG (2.5MG/ML)
1.3000 mg/m2 | Freq: Once | INTRAMUSCULAR | Status: AC
  Administered 2023-03-26: 2 mg via SUBCUTANEOUS
  Filled 2023-03-26: qty 0.8

## 2023-03-26 MED ORDER — DEXAMETHASONE 4 MG PO TABS
10.0000 mg | ORAL_TABLET | Freq: Once | ORAL | Status: AC
  Administered 2023-03-26: 10 mg via ORAL
  Filled 2023-03-26: qty 3

## 2023-03-26 NOTE — Progress Notes (Signed)
Patient presents today for Velcade injection. Labs obtained on 03/17/2023 at the [facility]. Message received from Dr. Ellin Saba to proceed with treatment using labs from 03/17/2023 since stable. Vital signs and labs within parameters for treatment.   Velcade given today per MD orders. Tolerated without adverse affects. Vital signs stable. No complaints at this time. Discharged from clinic by wheel chair in stable condition. Alert and oriented x 3. F/U with [facility] as scheduled.

## 2023-03-26 NOTE — Patient Instructions (Signed)
[facility]  Discharge Instructions: Thank you for choosing [facility] to provide your oncology and hematology care.  If you have a lab appointment with the Cancer Center - please note that after April 8th, 2024, all labs will be drawn in the cancer center.  You do not have to check in or register with the main entrance as you have in the past but will complete your check-in in the cancer center.  Wear comfortable clothing and clothing appropriate for easy access to any Portacath or PICC line.   We strive to give you quality time with your provider. You may need to reschedule your appointment if you arrive late (15 or more minutes).  Arriving late affects you and other patients whose appointments are after yours.  Also, if you miss three or more appointments without notifying the office, you may be dismissed from the clinic at the provider's discretion.      For prescription refill requests, have your pharmacy contact our office and allow 72 hours for refills to be completed.    Today you received the following chemotherapy and/or immunotherapy agents Velcade. Bortezomib Injection What is this medication? BORTEZOMIB (bor TEZ oh mib) treats lymphoma. It may also be used to treat multiple myeloma, a type of bone marrow cancer. It works by blocking a protein that causes cancer cells to grow and multiply. This helps to slow or stop the spread of cancer cells. This medicine may be used for other purposes; ask your health care provider or pharmacist if you have questions. COMMON BRAND NAME(S): Velcade What should I tell my care team before I take this medication? They need to know if you have any of these conditions: Dehydration Diabetes Heart disease Liver disease Tingling of the fingers or toes or other nerve disorder An unusual or allergic reaction to bortezomib, other medications, foods, dyes, or preservatives If you or your partner are pregnant or trying to  get pregnant Breastfeeding How should I use this medication? This medication is injected into a vein or under the skin. It is given by your care team in a hospital or clinic setting. Talk to your care team about the use of this medication in children. Special care may be needed. Overdosage: If you think you have taken too much of this medicine contact a poison control center or emergency room at once. NOTE: This medicine is only for you. Do not share this medicine with others. What if I miss a dose? Keep appointments for follow-up doses. It is important not to miss your dose. Call your care team if you are unable to keep an appointment. What may interact with this medication? Ketoconazole Rifampin This list may not describe all possible interactions. Give your health care provider a list of all the medicines, herbs, non-prescription drugs, or dietary supplements you use. Also tell them if you smoke, drink alcohol, or use illegal drugs. Some items may interact with your medicine. What should I watch for while using this medication? Your condition will be monitored carefully while you are receiving this medication. You may need blood work while taking this medication. This medication may affect your coordination, reaction time, or judgment. Do not drive or operate machinery until you know how this medication affects you. Sit up or stand slowly to reduce the risk of dizzy or fainting spells. Drinking alcohol with this medication can increase the risk of these side effects. This medication may increase your risk of getting an infection. Call   your care team for advice if you get a fever, chills, sore throat, or other symptoms of a cold or flu. Do not treat yourself. Try to avoid being around people who are sick. Check with your care team if you have severe diarrhea, nausea, and vomiting, or if you sweat a lot. The loss of too much body fluid may make it dangerous for you to take this medication. Talk to  your care team if you may be pregnant. Serious birth defects can occur if you take this medication during pregnancy and for 7 months after the last dose. You will need a negative pregnancy test before starting this medication. Contraception is recommended while taking this medication and for 7 months after the last dose. Your care team can help you find the option that works for you. If your partner can get pregnant, use a condom during sex while taking this medication and for 4 months after the last dose. Do not breastfeed while taking this medication and for 2 months after the last dose. This medication may cause infertility. Talk to your care team if you are concerned about your fertility. What side effects may I notice from receiving this medication? Side effects that you should report to your care team as soon as possible: Allergic reactions--skin rash, itching, hives, swelling of the face, lips, tongue, or throat Bleeding--bloody or black, tar-like stools, vomiting blood or brown material that looks like coffee grounds, red or dark brown urine, small red or purple spots on skin, unusual bruising or bleeding Bleeding in the brain--severe headache, stiff neck, confusion, dizziness, change in vision, numbness or weakness of the face, arm, or leg, trouble speaking, trouble walking, vomiting Bowel blockage--stomach cramping, unable to have a bowel movement or pass gas, loss of appetite, vomiting Heart failure--shortness of breath, swelling of the ankles, feet, or hands, sudden weight gain, unusual weakness or fatigue Infection--fever, chills, cough, sore throat, wounds that don't heal, pain or trouble when passing urine, general feeling of discomfort or being unwell Liver injury--right upper belly pain, loss of appetite, nausea, light-colored stool, dark yellow or brown urine, yellowing skin or eyes, unusual weakness or fatigue Low blood pressure--dizziness, feeling faint or lightheaded, blurry  vision Lung injury--shortness of breath or trouble breathing, cough, spitting up blood, chest pain, fever Pain, tingling, or numbness in the hands or feet Severe or prolonged diarrhea Stomach pain, bloody diarrhea, pale skin, unusual weakness or fatigue, decrease in the amount of urine, which may be signs of hemolytic uremic syndrome Sudden and severe headache, confusion, change in vision, seizures, which may be signs of posterior reversible encephalopathy syndrome (PRES) TTP--purple spots on the skin or inside the mouth, pale skin, yellowing skin or eyes, unusual weakness or fatigue, fever, fast or irregular heartbeat, confusion, change in vision, trouble speaking, trouble walking Tumor lysis syndrome (TLS)--nausea, vomiting, diarrhea, decrease in the amount of urine, dark urine, unusual weakness or fatigue, confusion, muscle pain or cramps, fast or irregular heartbeat, joint pain Side effects that usually do not require medical attention (report to your care team if they continue or are bothersome): Constipation Diarrhea Fatigue Loss of appetite Nausea This list may not describe all possible side effects. Call your doctor for medical advice about side effects. You may report side effects to FDA at 1-800-FDA-1088. Where should I keep my medication? This medication is given in a hospital or clinic. It will not be stored at home. NOTE: This sheet is a summary. It may not cover all possible information. If   you have questions about this medicine, talk to your doctor, pharmacist, or health care provider.  2023 Elsevier/Gold Standard (2022-04-30 00:00:00)       To help prevent nausea and vomiting after your treatment, we encourage you to take your nausea medication as directed.  BELOW ARE SYMPTOMS THAT SHOULD BE REPORTED IMMEDIATELY: *FEVER GREATER THAN 100.4 F (38 C) OR HIGHER *CHILLS OR SWEATING *NAUSEA AND VOMITING THAT IS NOT CONTROLLED WITH YOUR NAUSEA MEDICATION *UNUSUAL SHORTNESS OF  BREATH *UNUSUAL BRUISING OR BLEEDING *URINARY PROBLEMS (pain or burning when urinating, or frequent urination) *BOWEL PROBLEMS (unusual diarrhea, constipation, pain near the anus) TENDERNESS IN MOUTH AND THROAT WITH OR WITHOUT PRESENCE OF ULCERS (sore throat, sores in mouth, or a toothache) UNUSUAL RASH, SWELLING OR PAIN  UNUSUAL VAGINAL DISCHARGE OR ITCHING   Items with * indicate a potential emergency and should be followed up as soon as possible or go to the Emergency Department if any problems should occur.  Please show the CHEMOTHERAPY ALERT CARD or IMMUNOTHERAPY ALERT CARD at check-in to the Emergency Department and triage nurse.  Should you have questions after your visit or need to cancel or reschedule your appointment, please contact [facility] [phone]  and follow the prompts.  Office hours are 8:00 a.m. to 4:30 p.m. Monday - Friday. Please note that voicemails left after 4:00 p.m. may not be returned until the following business day.  We are closed weekends and major holidays. You have access to a nurse at all times for urgent questions. Please call the main number to the clinic [phone] and follow the prompts.  For any non-urgent questions, you may also contact your provider using MyChart. We now offer e-Visits for anyone 18 and older to request care online for non-urgent symptoms. For details visit [email or web address].   Also download the MyChart app! Go to the app store, search "MyChart", open the app, select [facility], and log in with your MyChart username and password.   

## 2023-03-30 DIAGNOSIS — E119 Type 2 diabetes mellitus without complications: Secondary | ICD-10-CM | POA: Diagnosis not present

## 2023-03-30 DIAGNOSIS — H524 Presbyopia: Secondary | ICD-10-CM | POA: Diagnosis not present

## 2023-03-30 DIAGNOSIS — Z961 Presence of intraocular lens: Secondary | ICD-10-CM | POA: Diagnosis not present

## 2023-04-01 ENCOUNTER — Inpatient Hospital Stay: Payer: Medicare PPO

## 2023-04-02 ENCOUNTER — Other Ambulatory Visit: Payer: Self-pay

## 2023-04-02 ENCOUNTER — Telehealth: Payer: Self-pay | Admitting: *Deleted

## 2023-04-02 ENCOUNTER — Encounter (HOSPITAL_COMMUNITY): Payer: Self-pay | Admitting: *Deleted

## 2023-04-02 ENCOUNTER — Observation Stay (HOSPITAL_COMMUNITY)
Admission: EM | Admit: 2023-04-02 | Discharge: 2023-04-03 | Disposition: A | Discharge status: Skilled Nursing Facility | Payer: Medicare PPO | Attending: Family Medicine | Admitting: Family Medicine

## 2023-04-02 ENCOUNTER — Inpatient Hospital Stay: Payer: Medicare PPO

## 2023-04-02 DIAGNOSIS — E039 Hypothyroidism, unspecified: Secondary | ICD-10-CM | POA: Diagnosis not present

## 2023-04-02 DIAGNOSIS — F0390 Unspecified dementia without behavioral disturbance: Secondary | ICD-10-CM | POA: Diagnosis not present

## 2023-04-02 DIAGNOSIS — K922 Gastrointestinal hemorrhage, unspecified: Secondary | ICD-10-CM | POA: Diagnosis not present

## 2023-04-02 DIAGNOSIS — E1122 Type 2 diabetes mellitus with diabetic chronic kidney disease: Secondary | ICD-10-CM | POA: Diagnosis not present

## 2023-04-02 DIAGNOSIS — Z7982 Long term (current) use of aspirin: Secondary | ICD-10-CM | POA: Diagnosis not present

## 2023-04-02 DIAGNOSIS — K921 Melena: Secondary | ICD-10-CM | POA: Diagnosis not present

## 2023-04-02 DIAGNOSIS — D696 Thrombocytopenia, unspecified: Secondary | ICD-10-CM | POA: Diagnosis not present

## 2023-04-02 DIAGNOSIS — K626 Ulcer of anus and rectum: Secondary | ICD-10-CM | POA: Diagnosis not present

## 2023-04-02 DIAGNOSIS — N1832 Chronic kidney disease, stage 3b: Secondary | ICD-10-CM | POA: Diagnosis not present

## 2023-04-02 DIAGNOSIS — Z79899 Other long term (current) drug therapy: Secondary | ICD-10-CM | POA: Diagnosis not present

## 2023-04-02 DIAGNOSIS — Z1152 Encounter for screening for COVID-19: Secondary | ICD-10-CM | POA: Insufficient documentation

## 2023-04-02 DIAGNOSIS — I129 Hypertensive chronic kidney disease with stage 1 through stage 4 chronic kidney disease, or unspecified chronic kidney disease: Secondary | ICD-10-CM | POA: Diagnosis present

## 2023-04-02 DIAGNOSIS — C9000 Multiple myeloma not having achieved remission: Secondary | ICD-10-CM | POA: Diagnosis present

## 2023-04-02 DIAGNOSIS — I10 Essential (primary) hypertension: Secondary | ICD-10-CM | POA: Diagnosis not present

## 2023-04-02 DIAGNOSIS — N183 Chronic kidney disease, stage 3 unspecified: Secondary | ICD-10-CM | POA: Diagnosis present

## 2023-04-02 DIAGNOSIS — K648 Other hemorrhoids: Secondary | ICD-10-CM | POA: Insufficient documentation

## 2023-04-02 DIAGNOSIS — K625 Hemorrhage of anus and rectum: Secondary | ICD-10-CM | POA: Diagnosis present

## 2023-04-02 DIAGNOSIS — F419 Anxiety disorder, unspecified: Secondary | ICD-10-CM | POA: Diagnosis present

## 2023-04-02 LAB — CBC WITH DIFFERENTIAL/PLATELET
Abs Immature Granulocytes: 0.01 10*3/uL (ref 0.00–0.07)
Basophils Absolute: 0 10*3/uL (ref 0.0–0.1)
Basophils Relative: 0 %
Eosinophils Absolute: 0.2 10*3/uL (ref 0.0–0.5)
Eosinophils Relative: 3 %
HCT: 34.6 % — ABNORMAL LOW (ref 36.0–46.0)
Hemoglobin: 11.5 g/dL — ABNORMAL LOW (ref 12.0–15.0)
Immature Granulocytes: 0 %
Lymphocytes Relative: 9 %
Lymphs Abs: 0.5 10*3/uL — ABNORMAL LOW (ref 0.7–4.0)
MCH: 33 pg (ref 26.0–34.0)
MCHC: 33.2 g/dL (ref 30.0–36.0)
MCV: 99.4 fL (ref 80.0–100.0)
Monocytes Absolute: 0.6 10*3/uL (ref 0.1–1.0)
Monocytes Relative: 10 %
Neutro Abs: 4.3 10*3/uL (ref 1.7–7.7)
Neutrophils Relative %: 78 %
Platelets: 67 10*3/uL — ABNORMAL LOW (ref 150–400)
RBC: 3.48 MIL/uL — ABNORMAL LOW (ref 3.87–5.11)
RDW: 15.5 % (ref 11.5–15.5)
WBC: 5.5 10*3/uL (ref 4.0–10.5)
nRBC: 0 % (ref 0.0–0.2)

## 2023-04-02 LAB — COMPREHENSIVE METABOLIC PANEL
ALT: 30 U/L (ref 0–44)
AST: 36 U/L (ref 15–41)
Albumin: 3.5 g/dL (ref 3.5–5.0)
Alkaline Phosphatase: 65 U/L (ref 38–126)
Anion gap: 8 (ref 5–15)
BUN: 25 mg/dL — ABNORMAL HIGH (ref 8–23)
CO2: 23 mmol/L (ref 22–32)
Calcium: 8.6 mg/dL — ABNORMAL LOW (ref 8.9–10.3)
Chloride: 107 mmol/L (ref 98–111)
Creatinine, Ser: 1.23 mg/dL — ABNORMAL HIGH (ref 0.44–1.00)
GFR, Estimated: 45 mL/min — ABNORMAL LOW (ref >60)
Glucose, Bld: 92 mg/dL (ref 70–99)
Potassium: 3.7 mmol/L (ref 3.5–5.1)
Sodium: 138 mmol/L (ref 135–145)
Total Bilirubin: 0.5 mg/dL (ref 0.3–1.2)
Total Protein: 6.7 g/dL (ref 6.5–8.1)

## 2023-04-02 LAB — CBC
HCT: 35.7 % — ABNORMAL LOW (ref 36.0–46.0)
Hemoglobin: 12.1 g/dL (ref 12.0–15.0)
MCH: 33 pg (ref 26.0–34.0)
MCHC: 33.9 g/dL (ref 30.0–36.0)
MCV: 97.3 fL (ref 80.0–100.0)
Platelets: 65 10*3/uL — ABNORMAL LOW (ref 150–400)
RBC: 3.67 MIL/uL — ABNORMAL LOW (ref 3.87–5.11)
RDW: 15.3 % (ref 11.5–15.5)
WBC: 5.4 10*3/uL (ref 4.0–10.5)
nRBC: 0 % (ref 0.0–0.2)

## 2023-04-02 LAB — PROTIME-INR
INR: 1.1 (ref 0.8–1.2)
Prothrombin Time: 14 seconds (ref 11.4–15.2)

## 2023-04-02 LAB — TYPE AND SCREEN
ABO/RH(D): O POS
Antibody Screen: NEGATIVE

## 2023-04-02 MED ORDER — LEVOTHYROXINE SODIUM 25 MCG PO TABS
25.0000 ug | ORAL_TABLET | Freq: Every day | ORAL | Status: DC
  Administered 2023-04-03: 25 ug via ORAL
  Filled 2023-04-02: qty 1

## 2023-04-02 MED ORDER — ONDANSETRON HCL 4 MG/2ML IJ SOLN: 4.0000 mg | Freq: Four times a day (QID) | INTRAMUSCULAR | Status: DC | PRN

## 2023-04-02 MED ORDER — POTASSIUM CHLORIDE IN NACL 20-0.9 MEQ/L-% IV SOLN: INTRAVENOUS | Status: AC

## 2023-04-02 MED ORDER — ACETAMINOPHEN 650 MG RE SUPP: 650.0000 mg | Freq: Four times a day (QID) | RECTAL | Status: DC | PRN

## 2023-04-02 MED ORDER — ACETAMINOPHEN 325 MG PO TABS: 650.0000 mg | ORAL_TABLET | Freq: Four times a day (QID) | ORAL | Status: DC | PRN

## 2023-04-02 MED ORDER — BUSPIRONE HCL 5 MG PO TABS
20.0000 mg | ORAL_TABLET | Freq: Three times a day (TID) | ORAL | Status: DC
  Administered 2023-04-02 – 2023-04-03 (×2): 20 mg via ORAL
  Filled 2023-04-02 (×2): qty 4

## 2023-04-02 MED ORDER — SODIUM CHLORIDE 0.9 % IV SOLN: INTRAVENOUS | Status: DC

## 2023-04-02 MED ORDER — ONDANSETRON HCL 4 MG PO TABS: 4.0000 mg | ORAL_TABLET | Freq: Four times a day (QID) | ORAL | Status: DC | PRN

## 2023-04-02 NOTE — H&P (Addendum)
History and Physical    Amanda RAFFETY ZOX:[id number] DOB: 1946-03-04 DOA: 04/02/2023  PCP: Pecola Lawless, MD   Patient coming from: [facility]  I have personally briefly reviewed patient's old medical records in [facility]  Chief Complaint: Rectal Bleeding  HPI: Amanda Wu is a 77 y.o. female with medical history significant for dementia, hypertension, CKD 3, diabetes mellitus, anxiety. Patient presented to the ED from [facility] via EMS with reports of bloody in stool.  At the time of my evaluation, patient is awake and alert, but due to her dementia she is able to tell me her name but not provide any history.  Patient denies any pain.  ED Course: Blood pressure 150s to 190s.  Temperature 97.9.  Heart rate 70s to 90s.  Globin stable at 11.5.  Rectal exam with mucoid bloody stools. GI consulted in ED.  Hospitalist to admit.  Review of Systems: As per HPI all other systems reviewed and negative.  Past Medical History:  Diagnosis Date   Benign hypertension    Cataract    Central nervous system lymphoma    Dementia    Diabetes mellitus with CKD    H/O partial nephrectomy    Hypokalemia    Hypothyroidism    Impaired cognition    Multiple myeloma    Dr Kayleen Memos, [facility]    Past Surgical History:  Procedure Laterality Date   ABDOMINAL HYSTERECTOMY     CRANIOTOMY     for lymphoma   YAG LASER APPLICATION Right 07/02/2015   Procedure: YAG LASER APPLICATION;  Surgeon: Susa Simmonds, MD;  Location: [facility];  Service: Ophthalmology;  Laterality: Right;     reports that she has never smoked. She has never used smokeless tobacco. She reports that she does not drink alcohol and does not use drugs.  Allergies  Allergen Reactions   Lotensin [Benazepril]     Family History  Problem Relation Age of Onset   Depression Mother    Diabetes Mother    Heart disease Father    Cancer - Prostate Brother    Cancer Brother    Cancer Sister     Prior to Admission  medications   Medication Sig Start Date End Date Taking? Authorizing Provider  acyclovir (ZOVIRAX) 400 MG tablet TAKE 1 TABLET BY MOUTH TWICE DAILY 07/18/20  Yes Doreatha Massed, MD  aspirin EC 81 MG tablet Take 81 mg by mouth daily.   Yes [provider]  busPIRone (BUSPAR) 10 MG tablet Take 20 mg by mouth 3 (three) times daily. For anxiety   Yes [provider]  Calcium Carbonate-Vit D-Min (CALCIUM 600+D PLUS MINERALS) 600-400 MG-UNIT CHEW Chew 1 tablet by mouth daily.   Yes [provider]  cholestyramine (QUESTRAN) 4 g packet Take 4 g by mouth 2 (two) times daily between meals. 12/21/20  Yes [provider]  LACTOBACILLUS PROBIOTIC PO Take 2 capsules by mouth daily. 09/12/20  Yes [provider]  levothyroxine (SYNTHROID, LEVOTHROID) 25 MCG tablet Take 25 mcg by mouth daily before breakfast.   Yes [provider]  lipase/protease/amylase (CREON) 36000 UNITS CPEP capsule 36,000-114,000- 180,000 unit; amt: 1; oral Special Instructions: Take one capsule with snacks if eaten between meals. Twice A Day Between Meals as needed   Yes [provider]  lipase/protease/amylase (CREON) 36000 UNITS CPEP capsule Take 3 capsules by mouth 3 (three) times daily with meals.   Yes [provider]  losartan (COZAAR) 50 MG tablet Take 1  tablet (50 mg total) by mouth daily. 05/24/20  Yes Doreatha Massed, MD  potassium chloride SA (KLOR-CON) 20 MEQ tablet Take 20 mEq by mouth daily.  08/11/20  Yes [provider]  loperamide (IMODIUM A-D) 2 MG tablet Take 2 mg by mouth 3 (three) times daily. For diarrhea 08/21/20   [provider]  NON FORMULARY Dysphagia 2 diet with thin liquids 04/11/22   [provider]  NON FORMULARY Wanderguard [phone] to ankle for safety awareness. Check placement and function qshift. Special Instructions: Check placement and function qshift. Every Shift Day, Evening, Night 07/19/20   [provider]    Physical Exam: Vitals:   04/02/23 1234 04/02/23 1330 04/02/23 1400 04/02/23 1500  BP: (!) 154/97 (!) 162/115 (!) 191/91 (!) 168/99  Pulse: 73 71 71 93  Resp: Temp: 97.9 F (36.6 C)     TempSrc: Oral     SpO2: 100% 96% 99% 100%  Weight:      Height:        Constitutional: NAD, calm, comfortable Vitals:   04/02/23 1234 04/02/23 1330 04/02/23 1400 04/02/23 1500  BP: (!) 154/97 (!) 162/115 (!) 191/91 (!) 168/99  Pulse: 73 71 71 93  Resp: Temp: 97.9 F (36.6 C)     TempSrc: Oral     SpO2: 100% 96% 99% 100%  Weight:      Height:       Eyes: PERRL, lids and conjunctivae normal ENMT: Mucous membranes are moist.   Neck: normal, supple, no masses, no thyromegaly Respiratory: clear to auscultation bilaterally, no wheezing, no crackles. Normal respiratory effort. No accessory muscle use.  Cardiovascular: Regular rate and rhythm, no murmurs / rubs / gallops. No extremity edema. 2+ pedal pulses. No carotid bruits.  Abdomen: no tenderness, no masses palpated. No hepatosplenomegaly. Bowel sounds positive.  Musculoskeletal: no clubbing / cyanosis. No joint deformity upper and lower extremities.  Skin: no rashes, lesions, ulcers. No induration Neurologic: Moving extremities spontaneously.  No facial asymmetry, speech confused but clear. Psychiatric: Awake and alert, oriented to person only.  Confused speech.  Labs on Admission: I have personally reviewed following labs and imaging studies  CBC: Recent Labs  Lab 04/02/23 1349  WBC 5.5  NEUTROABS 4.3  HGB 11.5*  HCT 34.6*  MCV 99.4  PLT 67*   Basic Metabolic Panel: Recent Labs  Lab 04/02/23 1349  NA 138  K 3.7  CL 107  CO2 23  GLUCOSE 92  BUN 25*  CREATININE 1.23*  CALCIUM 8.6*   GFR: Estimated Creatinine Clearance: 31.3 mL/min (A) (by C-G formula based on SCr of 1.23 mg/dL (H)). Liver Function Tests: Recent Labs  Lab 04/02/23 1349  AST 36  ALT 30  ALKPHOS 65  BILITOT  0.5  PROT 6.7  ALBUMIN 3.5   Coagulation Profile: Recent Labs  Lab 04/02/23 1349  INR 1.1   Radiological Exams on Admission: No results found.  EKG: None  Assessment/Plan Principal Problem:   GI bleed Active Problems:   Multiple myeloma   Anxiety   CKD stage 3 due to type 2 diabetes mellitus   Type 2 diabetes mellitus with stage 3 chronic kidney disease and hypertension   Hypertension associated with stage 3b chronic kidney disease due to type 2 diabetes mellitus   Dementia  Assessment and Plan: * GI bleed Hemoglobin stable 11.5.  Presented with bloody stools.  Also worsening thrombocytopenia of 67.  Vitals stable.  Home Medications  include aspirin.  Abdomen is nontender.  No colonoscopy or EGD on file. -GI consulted in ED, as for now, n.p.o. midnight, plans for flexible sigmoidoscopy tomorrow -CBC every 8 hourly x 3 - N/s + 20kcl 75cc/hr x 15hrs   Thrombocytopenia Platelets 67, baseline over the past 5 months has ranged from 80-1 40s.  Dementia Alert and oriented to person only.  Speech confused.  Mostly in a wheelchair..  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  Hypertension associated with stage 3b chronic kidney disease due to type 2 diabetes mellitus Systolic 150s to 834P. -Resume losartan 50 mg daily  Type 2 diabetes mellitus with stage 3 chronic kidney disease and hypertension Controlled.  A1c 5.6.  Not on medications.  CKD stage 3 due to type 2 diabetes mellitus CKD 3A-B, creatinine stable at 1.23.  Multiple myeloma Follows with Dr. Ellin Saba.  Last visit 03/19/2023.  She is on Velcade, Revlimid, dexamethasone.   DVT prophylaxis: SCDS Code Status: DNR form at bedside from Nursing home Family Communication: None at bedside. Disposition Plan: ~ 2 days Consults called: GI Admission status:  Obs tele   Author: Onnie Boer, MD 04/02/2023 5:03 PM  For on call review [email or web address].

## 2023-04-02 NOTE — Assessment & Plan Note (Signed)
Alert and oriented to person only.  Speech confused.  Mostly in a wheelchair..  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

## 2023-04-02 NOTE — Assessment & Plan Note (Signed)
Platelets 67, baseline over the past 5 months has ranged from 80-1 40s.

## 2023-04-02 NOTE — Telephone Encounter (Signed)
Received call from [facility] of plt 69 and bloody stools.  Spoke to [employer] and advised that they send her to the ER for evaluation.  Verbalized understanding.

## 2023-04-02 NOTE — Assessment & Plan Note (Signed)
CKD 3A-B, creatinine stable at 1.23.

## 2023-04-02 NOTE — Assessment & Plan Note (Signed)
Follows with Dr. Ellin Saba.  Last visit 03/19/2023.  She is on Velcade, Revlimid, dexamethasone.

## 2023-04-02 NOTE — Assessment & Plan Note (Signed)
Systolic 150s to 643C. -Resume losartan 50 mg daily

## 2023-04-02 NOTE — ED Triage Notes (Signed)
Arrived by EMS from [facility], ems was called due to blood in stool and platelet count is elevated.

## 2023-04-02 NOTE — ED Provider Notes (Signed)
[place] EMERGENCY DEPARTMENT AT [facility] Provider Note   CSN: [id number] Arrival date & time: 04/02/23  1224     History  Chief Complaint  Patient presents with   GI Bleeding    Amanda Wu is a 77 y.o. female.  HPI   Patient has a history of multiple myeloma, vascular dementia, hypothyroidism, thrombocytopenia, pancreatic insufficiency, diabetes, hypertension, chronic kidney disease.  She presents to the ED from the nursing facility for evaluation of blood in her stool.  Patient herself cannot tell me why she is here.  She denies any pain.  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  Home Medications Prior to Admission medications   Medication Sig Start Date End Date Taking? Authorizing Provider  acyclovir (ZOVIRAX) 400 MG tablet TAKE 1 TABLET BY MOUTH TWICE DAILY 07/18/20  Yes Doreatha Massed, MD  aspirin EC 81 MG tablet Take 81 mg by mouth daily.   Yes [provider]  busPIRone (BUSPAR) 10 MG tablet Take 20 mg by mouth 3 (three) times daily. For anxiety   Yes [provider]  Calcium Carbonate-Vit D-Min (CALCIUM 600+D PLUS MINERALS) 600-400 MG-UNIT CHEW Chew 1 tablet by mouth daily.   Yes [provider]  cholestyramine (QUESTRAN) 4 g packet Take 4 g by mouth 2 (two) times daily between meals. 12/21/20  Yes [provider]  LACTOBACILLUS PROBIOTIC PO Take 2 capsules by mouth daily. 09/12/20  Yes [provider]  levothyroxine (SYNTHROID, LEVOTHROID) 25 MCG tablet Take 25 mcg by mouth daily before breakfast.   Yes [provider]  lipase/protease/amylase (CREON) 36000 UNITS CPEP capsule 36,000-114,000- 180,000 unit; amt: 1; oral Special Instructions: Take one capsule with snacks if eaten between meals. Twice A Day Between Meals as needed   Yes [provider]  lipase/protease/amylase (CREON) 36000 UNITS CPEP capsule Take 3 capsules by mouth 3 (three) times daily with meals.   Yes [provider]   losartan (COZAAR) 50 MG tablet Take 1 tablet (50 mg total) by mouth daily. 05/24/20  Yes Doreatha Massed, MD  potassium chloride SA (KLOR-CON) 20 MEQ tablet Take 20 mEq by mouth daily.  08/11/20  Yes [provider]  loperamide (IMODIUM A-D) 2 MG tablet Take 2 mg by mouth 3 (three) times daily. For diarrhea 08/21/20   [provider]  NON FORMULARY Dysphagia 2 diet with thin liquids 04/11/22   [provider]  NON FORMULARY Wanderguard [phone] to ankle for safety awareness. Check placement and function qshift. Special Instructions: Check placement and function qshift. Every Shift Day, Evening, Night 07/19/20   [provider]      Allergies    Lotensin [benazepril]    Review of Systems   Review of Systems  Physical Exam Updated Vital Signs BP (!) 168/99   Pulse 93   Temp 97.9 F (36.6 C) (Oral)   Resp 18   Ht 1.753 m ( )   Wt 52.6 kg   SpO2 100%   BMI 17.13 kg/m  Physical Exam Vitals and nursing note reviewed.  Constitutional:      General: She is not in acute distress.    Appearance: She is well-developed.  HENT:     Head: Normocephalic and atraumatic.     Right Ear: External ear normal.     Left Ear: External ear normal.  Eyes:     General: No scleral icterus.       Right eye: No discharge.  Left eye: No discharge.     Conjunctiva/sclera: Conjunctivae normal.  Neck:     Trachea: No tracheal deviation.  Cardiovascular:     Rate and Rhythm: Normal rate and regular rhythm.  Pulmonary:     Effort: Pulmonary effort is normal. No respiratory distress.     Breath sounds: Normal breath sounds. No stridor. No wheezing or rales.  Abdominal:     General: Bowel sounds are normal. There is no distension.     Palpations: Abdomen is soft.     Tenderness: There is no abdominal tenderness. There is no guarding or rebound.  Genitourinary:    Rectum: Guaiac result positive.     Comments: Mucoid bloody stool noted on rectal exam, guaiac  positive Musculoskeletal:        General: No tenderness or deformity.     Cervical back: Neck supple.  Skin:    General: Skin is warm and dry.     Findings: No rash.  Neurological:     General: No focal deficit present.     Mental Status: She is alert.     Cranial Nerves: No cranial nerve deficit, dysarthria or facial asymmetry.     Sensory: No sensory deficit.     Motor: No abnormal muscle tone or seizure activity.     Coordination: Coordination normal.  Psychiatric:        Mood and Affect: Mood normal.     ED Results / Procedures / Treatments   Labs (all labs ordered are listed, but only abnormal results are displayed) Labs Reviewed  COMPREHENSIVE METABOLIC PANEL - Abnormal; Notable for the following components:      Result Value   BUN 25 (*)    Creatinine, Ser 1.23 (*)    Calcium 8.6 (*)    GFR, Estimated 45 (*)    All other components within normal limits  CBC WITH DIFFERENTIAL/PLATELET - Abnormal; Notable for the following components:   RBC 3.48 (*)    Hemoglobin 11.5 (*)    HCT 34.6 (*)    Platelets 67 (*)    Lymphs Abs 0.5 (*)    All other components within normal limits  PROTIME-INR  POC OCCULT BLOOD, ED  TYPE AND SCREEN    EKG None  Radiology No results found.  Procedures Procedures    Medications Ordered in ED Medications  0.9 %  sodium chloride infusion ( Intravenous New Bag/Given 04/02/23 1412)    ED Course/ Medical Decision Making/ A&P Clinical Course as of 04/02/23 1537  Thu Apr 02, 2023  1455 Labs reviewed.  Stable hemoglobin.  Thrombocytopenia increased.  Creatinine elevated. [JK]  1455 Case discussed with Dr. Levon Hedger gastroenterology.  Will consult on patient [JK]  1532 Patient discussed with Dr. Mariea Clonts regarding admission [JK]    Clinical Course User Index [JK] Linwood Dibbles, MD                             Medical Decision Making Problems Addressed: Lower GI bleed: acute illness or injury that poses a threat to life or bodily  functions Thrombocytopenia: chronic illness or injury  Amount and/or Complexity of Data Reviewed Labs: ordered. Decision-making details documented in ED Course.  Risk Prescription drug management. Decision regarding hospitalization.   Patient presented to the ED for evaluation of GI bleeding.  Patient noted to have mucoid bloody stools on exam.  She is not having any pain.  No fever.  No findings to suggest infection.  Patient does have thrombocytopenia but at this time is not showing any signs of serious active bleeding to require platelet transfusion.  No requirement for blood transfusion at this time.  Will continue to monitor closely.  Case discussed with GI and the hospitalist service.  Will admit for further treatment and evaluation.       Final Clinical Impression(s) / ED Diagnoses Final diagnoses:  Lower GI bleed  Thrombocytopenia    Rx / DC Orders ED Discharge Orders     None         Linwood Dibbles, MD 04/02/23 1537

## 2023-04-02 NOTE — Consult Note (Signed)
Gastroenterology Consult   Referring Provider: No ref. provider found Primary Care Physician:  Pecola Lawless, MD Primary Gastroenterologist:  Hennie Duos. Marletta Lor, DO   Patient ID: Amanda Wu; [id number]; 1946/03/19   Admit date: 04/02/2023  LOS: 0 days   Date of Consultation: 04/02/2023  Reason for Consultation:  rectal bleeding    History of Present Illness   Amanda Wu is a 77 y.o. 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 Ellin Saba), ?history of partial nephrectomy, presenting from SNF due to bloody stools.   Patient unable to provide any history. No available records of prior colonoscopy.   In the ED: per EDP Mucoid bloody stool noted on rectal exam, heme positive. Repeat rectal exam by Dr. Levon Hedger with scant amount of fresh blood in rectum. BUN 25, creatinine 1.23, white blood cell count 5500, hemoglobin 11.5 at baseline, MCV 99.4, platelets 67,000.  INR 1.1.   Prior to Admission medications   Medication Sig Start Date End Date Taking? Authorizing Provider  acyclovir (ZOVIRAX) 400 MG tablet TAKE 1 TABLET BY MOUTH TWICE DAILY 07/18/20  Yes Doreatha Massed, MD  aspirin EC 81 MG tablet Take 81 mg by mouth daily.   Yes [provider]  busPIRone (BUSPAR) 10 MG tablet Take 20 mg by mouth 3 (three) times daily. For anxiety   Yes [provider]  Calcium Carbonate-Vit D-Min (CALCIUM 600+D PLUS MINERALS) 600-400 MG-UNIT CHEW Chew 1 tablet by mouth daily.   Yes [provider]  cholestyramine (QUESTRAN) 4 g packet Take 4 g by mouth 2 (two) times daily between meals. 12/21/20  Yes [provider]  LACTOBACILLUS PROBIOTIC PO Take 2 capsules by mouth daily. 09/12/20  Yes [provider]  levothyroxine (SYNTHROID, LEVOTHROID) 25 MCG  tablet Take 25 mcg by mouth daily before breakfast.   Yes [provider]  lipase/protease/amylase (CREON) 36000 UNITS CPEP capsule 36,000-114,000- 180,000 unit; amt: 1; oral Special Instructions: Take one capsule with snacks if eaten between meals. Twice A Day Between Meals as needed   Yes [provider]  lipase/protease/amylase (CREON) 36000 UNITS CPEP capsule Take 3 capsules by mouth 3 (three) times daily with meals.   Yes [provider]  losartan (COZAAR) 50 MG tablet Take 1 tablet (50 mg total) by mouth daily. 05/24/20  Yes Doreatha Massed, MD  potassium chloride SA (KLOR-CON) 20 MEQ tablet Take 20 mEq by mouth daily.  08/11/20  Yes [provider]  loperamide (IMODIUM A-D) 2 MG tablet Take 2 mg by mouth 3 (three) times daily. For diarrhea 08/21/20   [provider]  NON FORMULARY Dysphagia 2 diet with thin liquids 04/11/22   [provider]  NON FORMULARY Wanderguard [phone] to ankle for safety awareness. Check placement and function qshift. Special Instructions: Check placement and function qshift. Every Shift Day, Evening, Night 07/19/20   [provider]    Current Facility-Administered Medications  Medication Dose Route Frequency Provider Last Rate Last Admin   0.9 %  sodium chloride infusion   Intravenous Continuous Linwood Dibbles, MD 125 mL/hr at 04/02/23 1412 New Bag at 04/02/23 1412   Current Outpatient Medications  Medication Sig Dispense Refill   acyclovir (ZOVIRAX) 400 MG tablet TAKE 1 TABLET BY MOUTH TWICE DAILY 60 tablet 2   aspirin EC 81 MG tablet Take 81 mg by mouth daily.  busPIRone (BUSPAR) 10 MG tablet Take 20 mg by mouth 3 (three) times daily. For anxiety     Calcium Carbonate-Vit D-Min (CALCIUM 600+D PLUS MINERALS) 600-400 MG-UNIT CHEW Chew 1 tablet by mouth daily.     cholestyramine (QUESTRAN) 4 g packet Take 4 g by mouth 2 (two) times daily between meals.     LACTOBACILLUS PROBIOTIC PO Take 2 capsules by  mouth daily.     levothyroxine (SYNTHROID, LEVOTHROID) 25 MCG tablet Take 25 mcg by mouth daily before breakfast.     lipase/protease/amylase (CREON) 36000 UNITS CPEP capsule 36,000-114,000- 180,000 unit; amt: 1; oral Special Instructions: Take one capsule with snacks if eaten between meals. Twice A Day Between Meals as needed     lipase/protease/amylase (CREON) 36000 UNITS CPEP capsule Take 3 capsules by mouth 3 (three) times daily with meals.     losartan (COZAAR) 50 MG tablet Take 1 tablet (50 mg total) by mouth daily. 30 tablet 4   potassium chloride SA (KLOR-CON) 20 MEQ tablet Take 20 mEq by mouth daily.      loperamide (IMODIUM A-D) 2 MG tablet Take 2 mg by mouth 3 (three) times daily. For diarrhea     NON FORMULARY Dysphagia 2 diet with thin liquids     NON FORMULARY Wanderguard #[id number] to ankle for safety awareness. Check placement and function qshift. Special Instructions: Check placement and function qshift. Every Shift Day, Evening, Night     Facility-Administered Medications Ordered in Other Encounters  Medication Dose Route Frequency Provider Last Rate Last Admin   cyanocobalamin ((VITAMIN B-12)) 1000 MCG/ML injection            cyanocobalamin ((VITAMIN B-12)) 1000 MCG/ML injection            denosumab (XGEVA) 120 MG/1.7ML injection            dexamethasone (DECADRON) 4 MG tablet            dexamethasone (DECADRON) 4 MG tablet            diphenhydrAMINE (BENADRYL) 50 MG/ML injection            diphenhydrAMINE (BENADRYL) 50 MG/ML injection            diphenhydrAMINE (BENADRYL) 50 MG/ML injection            epoetin alfa-epbx (RETACRIT) 16109 UNIT/ML injection            epoetin alfa-epbx (RETACRIT) 60454 UNIT/ML injection            epoetin alfa-epbx (RETACRIT) 09811 UNIT/ML injection            fulvestrant (FASLODEX) 250 MG/5ML injection            lanreotide acetate (SOMATULINE DEPOT) 120 MG/0.5ML injection            lanreotide acetate (SOMATULINE DEPOT) 120 MG/0.5ML  injection            lanreotide acetate (SOMATULINE DEPOT) 120 MG/0.5ML injection            lanreotide acetate (SOMATULINE DEPOT) 120 MG/0.5ML injection            lidocaine (PF) (XYLOCAINE) 1 % injection            magnesium sulfate 2 GM/50ML IVPB            octreotide (SANDOSTATIN LAR) 30 MG IM injection            octreotide (SANDOSTATIN LAR) 30 MG IM injection  palonosetron (ALOXI) 0.25 MG/5ML injection            palonosetron (ALOXI) 0.25 MG/5ML injection            palonosetron (ALOXI) 0.25 MG/5ML injection            palonosetron (ALOXI) 0.25 MG/5ML injection            potassium chloride 10 MEQ/100ML IVPB            potassium chloride SA (KLOR-CON M) 20 MEQ CR tablet            potassium chloride SA (KLOR-CON M) 20 MEQ CR tablet            prochlorperazine (COMPAZINE) 10 MG tablet            prochlorperazine (COMPAZINE) 10 MG tablet             Allergies as of 04/02/2023 - Review Complete 04/02/2023  Allergen Reaction Noted   Lotensin [benazepril]  12/05/2020    Past Medical History:  Diagnosis Date   Benign hypertension    Cataract    Central nervous system lymphoma    Dementia    Diabetes mellitus with CKD    H/O partial nephrectomy    Hypokalemia    Hypothyroidism    Impaired cognition    Multiple myeloma    Dr Kayleen Memos, [facility]    Past Surgical History:  Procedure Laterality Date   ABDOMINAL HYSTERECTOMY     CRANIOTOMY     for lymphoma   YAG LASER APPLICATION Right 07/02/2015   Procedure: YAG LASER APPLICATION;  Surgeon: Susa Simmonds, MD;  Location: [facility];  Service: Ophthalmology;  Laterality: Right;    Family History  Problem Relation Age of Onset   Depression Mother    Diabetes Mother    Heart disease Father    Cancer - Prostate Brother    Cancer Brother    Cancer Sister     Social History   Socioeconomic History   Marital status: Single    Spouse name: Not on file   Number of children: Not on file   Years of education: Not on  file   Highest education level: Not on file  Occupational History   Occupation: retired   Tobacco Use   Smoking status: Never   Smokeless tobacco: Never  Vaping Use   Vaping Use: Never used  Substance and Sexual Activity   Alcohol use: No   Drug use: No   Sexual activity: Never  Other Topics Concern   Not on file  Social History Narrative   [facility]    Social Determinants of Health   Financial Resource Strain: Low Risk  (10/16/2020)   Overall Financial Resource Strain (CARDIA)    Difficulty of Paying Living Expenses: Not very hard  Food Insecurity: No Food Insecurity (10/16/2020)   Hunger Vital Sign    Worried About Running Out of Food in the Last Year: Never true    Ran Out of Food in the Last Year: Never true  Transportation Needs: No Transportation Needs (10/16/2020)   PRAPARE - Administrator, Civil Service (Medical): No    Lack of Transportation (Non-Medical): No  Physical Activity: Inactive (10/16/2020)   Exercise Vital Sign    Days of Exercise per Week: 0 days    Minutes of Exercise per Session: 0 min  Stress: No Stress Concern Present (10/16/2020)   [employer] of Occupational Health - Occupational Stress  Questionnaire    Feeling of Stress : Not at all  Social Connections: Moderately Isolated (10/16/2020)   Social Connection and Isolation Panel [NHANES]    Frequency of Communication with Friends and Family: More than three times a week    Frequency of Social Gatherings with Friends and Family: Once a week    Attends Religious Services: More than 4 times per year    Active Member of [employer] or Organizations: No    Attends Banker Meetings: Never    Marital Status: Never married  Catering manager Violence: Not on file     Review of System:   unobtainable      Physical Examination:   Vital signs in last 24 hours: Temp:  [97.9 F (36.6 C)] 97.9 F (36.6 C) (04/18 1234) Pulse Rate:  [71-73] 71 (04/18 1400) Resp:   [14-20] 14 (04/18 1400) BP: (154-191)/(91-115) 191/91 (04/18 1400) SpO2:  [96 %-100 %] 99 % (04/18 1400) Weight:  [52.6 kg] 52.6 kg (04/18 1231)    General: thin, alert female, pleasantly confused. In no acute distress.  Head: Normocephalic, atraumatic.   Eyes: Conjunctiva pink, no icterus. Mouth: Oropharyngeal mucosa moist and pink  Neck: Supple without thyromegaly, masses, or lymphadenopathy.  Lungs: Clear to auscultation bilaterally.  Heart: Regular rate and rhythm, no murmurs rubs or gallops.  Abdomen: Bowel sounds are normal, nontender, nondistended, no hepatosplenomegaly or masses, no abdominal bruits or hernia , no rebound or guarding.   Rectal: performed by EDP and Dr. Levon Hedger Extremities: No lower extremity edema, clubbing, deformity.  Neuro: Alert and oriented x 4 , grossly normal neurologically.  Skin: Warm and dry, no rash or jaundice.   Psych: Alert and cooperative, normal mood and affect.        Intake/Output from previous day: No intake/output data recorded. Intake/Output this shift: No intake/output data recorded.  Lab Results:   CBC Recent Labs    04/02/23 1349  WBC 5.5  HGB 11.5*  HCT 34.6*  MCV 99.4  PLT 67*   BMET Recent Labs    04/02/23 1349  NA 138  K 3.7  CL 107  CO2 23  GLUCOSE 92  BUN 25*  CREATININE 1.23*  CALCIUM 8.6*   LFT Recent Labs    04/02/23 1349  BILITOT 0.5  ALKPHOS 65  AST 36  ALT 30  PROT 6.7  ALBUMIN 3.5    Lipase No results for input(s): "LIPASE" in the last 72 hours.  PT/INR Recent Labs    04/02/23 1349  LABPROT 14.0  INR 1.1     Hepatitis Panel No results for input(s): "HEPBSAG", "HCVAB", "HEPAIGM", "HEPBIGM" in the last 72 hours.   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 Ellin Saba), ?history of partial nephrectomy, presenting from SNF due to bloody stools.   Rectal bleeding: Hemoglobin at baseline.  Scant amount of fresh blood noted on last rectal exam.  May be benign anorectal source.  It is unclear if she is ever had a complete colonoscopy, no records available.  Plan:   Plans for flexible sigmoidoscopy tomorrow.  Dr. Levon Hedger spoke to Ernesto Rutherford patient's sister who gave consent.   Monitor for ongoing bleeding. Trend H&H.   LOS: 0 days   We would like to thank you for the opportunity  to participate in the care of Casady M Zimbelman.  Leanna Battles. [facility] [phone] 4/18/20243:18 PM

## 2023-04-02 NOTE — Assessment & Plan Note (Addendum)
Hemoglobin stable 11.5.  Presented with bloody stools.  Also worsening thrombocytopenia of 67.  Vitals stable.  Home Medications include aspirin.  Abdomen is nontender.  No colonoscopy or EGD on file. -GI consulted in ED, as for now, n.p.o. midnight, plans for flexible sigmoidoscopy tomorrow -CBC every 8 hourly x 3 - N/s + 20kcl 75cc/hr x 15hrs

## 2023-04-02 NOTE — Assessment & Plan Note (Signed)
Controlled.  A1c 5.6.  Not on medications.

## 2023-04-03 ENCOUNTER — Observation Stay (HOSPITAL_BASED_OUTPATIENT_CLINIC_OR_DEPARTMENT_OTHER): Payer: Medicare PPO | Admitting: Certified Registered"

## 2023-04-03 ENCOUNTER — Encounter (HOSPITAL_COMMUNITY): Payer: Self-pay | Admitting: Internal Medicine

## 2023-04-03 ENCOUNTER — Encounter (HOSPITAL_COMMUNITY)
Admission: EM | Disposition: A | Discharge status: Skilled Nursing Facility | Payer: Self-pay | Source: Home / Self Care | Attending: Emergency Medicine

## 2023-04-03 ENCOUNTER — Observation Stay (HOSPITAL_COMMUNITY): Payer: Medicare PPO | Admitting: Certified Registered"

## 2023-04-03 DIAGNOSIS — Z7984 Long term (current) use of oral hypoglycemic drugs: Secondary | ICD-10-CM

## 2023-04-03 DIAGNOSIS — E119 Type 2 diabetes mellitus without complications: Secondary | ICD-10-CM

## 2023-04-03 DIAGNOSIS — I10 Essential (primary) hypertension: Secondary | ICD-10-CM | POA: Diagnosis not present

## 2023-04-03 DIAGNOSIS — F0390 Unspecified dementia without behavioral disturbance: Secondary | ICD-10-CM | POA: Diagnosis not present

## 2023-04-03 DIAGNOSIS — K648 Other hemorrhoids: Secondary | ICD-10-CM

## 2023-04-03 DIAGNOSIS — K625 Hemorrhage of anus and rectum: Secondary | ICD-10-CM

## 2023-04-03 DIAGNOSIS — K635 Polyp of colon: Secondary | ICD-10-CM

## 2023-04-03 DIAGNOSIS — E039 Hypothyroidism, unspecified: Secondary | ICD-10-CM | POA: Diagnosis not present

## 2023-04-03 DIAGNOSIS — D696 Thrombocytopenia, unspecified: Secondary | ICD-10-CM | POA: Diagnosis not present

## 2023-04-03 DIAGNOSIS — K626 Ulcer of anus and rectum: Secondary | ICD-10-CM | POA: Diagnosis not present

## 2023-04-03 DIAGNOSIS — D638 Anemia in other chronic diseases classified elsewhere: Secondary | ICD-10-CM | POA: Diagnosis not present

## 2023-04-03 HISTORY — PX: FLEXIBLE SIGMOIDOSCOPY: SHX5431

## 2023-04-03 LAB — GLUCOSE, CAPILLARY
Glucose-Capillary: 89 mg/dL (ref 70–99)
Glucose-Capillary: 89 mg/dL (ref 70–99)
Glucose-Capillary: 71 mg/dL (ref 70–99)
Glucose-Capillary: 149 mg/dL — ABNORMAL HIGH (ref 70–99)

## 2023-04-03 LAB — CBC
HCT: 32.1 % — ABNORMAL LOW (ref 36.0–46.0)
HCT: 34 % — ABNORMAL LOW (ref 36.0–46.0)
Hemoglobin: 10.7 g/dL — ABNORMAL LOW (ref 12.0–15.0)
Hemoglobin: 11.4 g/dL — ABNORMAL LOW (ref 12.0–15.0)
MCH: 32.4 pg (ref 26.0–34.0)
MCH: 32.9 pg (ref 26.0–34.0)
MCHC: 33.3 g/dL (ref 30.0–36.0)
MCHC: 33.5 g/dL (ref 30.0–36.0)
MCV: 97.3 fL (ref 80.0–100.0)
MCV: 98.3 fL (ref 80.0–100.0)
Platelets: 66 10*3/uL — ABNORMAL LOW (ref 150–400)
Platelets: 69 10*3/uL — ABNORMAL LOW (ref 150–400)
RBC: 3.3 MIL/uL — ABNORMAL LOW (ref 3.87–5.11)
RBC: 3.46 MIL/uL — ABNORMAL LOW (ref 3.87–5.11)
RDW: 15.3 % (ref 11.5–15.5)
RDW: 15.4 % (ref 11.5–15.5)
WBC: 4.1 10*3/uL (ref 4.0–10.5)
WBC: 4.1 10*3/uL (ref 4.0–10.5)
nRBC: 0 % (ref 0.0–0.2)
nRBC: 0 % (ref 0.0–0.2)

## 2023-04-03 LAB — BASIC METABOLIC PANEL
Anion gap: 7 (ref 5–15)
BUN: 18 mg/dL (ref 8–23)
CO2: 21 mmol/L — ABNORMAL LOW (ref 22–32)
Calcium: 8.2 mg/dL — ABNORMAL LOW (ref 8.9–10.3)
Chloride: 112 mmol/L — ABNORMAL HIGH (ref 98–111)
Creatinine, Ser: 1 mg/dL (ref 0.44–1.00)
GFR, Estimated: 58 mL/min — ABNORMAL LOW (ref >60)
Glucose, Bld: 73 mg/dL (ref 70–99)
Potassium: 3.7 mmol/L (ref 3.5–5.1)
Sodium: 140 mmol/L (ref 135–145)

## 2023-04-03 LAB — SARS CORONAVIRUS 2 BY RT PCR: SARS Coronavirus 2 by RT PCR: NEGATIVE

## 2023-04-03 LAB — OCCULT BLOOD, POC DEVICE: Fecal Occult Bld: POSITIVE — AB

## 2023-04-03 SURGERY — SIGMOIDOSCOPY, FLEXIBLE
Anesthesia: General

## 2023-04-03 MED ORDER — PROPOFOL 10 MG/ML IV BOLUS
INTRAVENOUS | Status: DC | PRN
  Administered 2023-04-03: 30 mg via INTRAVENOUS
  Administered 2023-04-03: 70 mg via INTRAVENOUS

## 2023-04-03 MED ORDER — DEXTROSE 50 % IV SOLN
INTRAVENOUS | Status: AC
  Filled 2023-04-03: qty 50

## 2023-04-03 MED ORDER — PHENYLEPHRINE 80 MCG/ML (10ML) SYRINGE FOR IV PUSH (FOR BLOOD PRESSURE SUPPORT)
PREFILLED_SYRINGE | INTRAVENOUS | Status: DC | PRN
  Administered 2023-04-03: 160 ug via INTRAVENOUS

## 2023-04-03 MED ORDER — SODIUM CHLORIDE 0.9 % IV SOLN: INTRAVENOUS | Status: DC

## 2023-04-03 MED ORDER — LOSARTAN POTASSIUM 50 MG PO TABS
50.0000 mg | ORAL_TABLET | Freq: Every day | ORAL | Status: DC
  Filled 2023-04-03: qty 1

## 2023-04-03 MED ORDER — LACTATED RINGERS IV SOLN: INTRAVENOUS | Status: DC

## 2023-04-03 MED ORDER — LIDOCAINE HCL (CARDIAC) PF 100 MG/5ML IV SOSY
PREFILLED_SYRINGE | INTRAVENOUS | Status: DC | PRN
  Administered 2023-04-03: 50 mg via INTRAVENOUS

## 2023-04-03 MED ORDER — DEXTROSE 50 % IV SOLN
25.0000 mL | Freq: Once | INTRAVENOUS | Status: AC
  Administered 2023-04-03: 25 mL via INTRAVENOUS

## 2023-04-03 NOTE — Discharge Summary (Signed)
Physician Discharge Summary   Patient: Amanda Wu MRN: [id number] DOB: 07-30-1946  Admit date:     04/02/2023  Discharge date: 04/03/23  Discharge Physician: Meredeth Ide   PCP: Pecola Lawless, MD   Recommendations at discharge:   Follow-up PCP in 2 weeks Follow-up hematology as outpatient Check CBC in 1 week   Discharge Diagnoses: Principal Problem:   GI bleed Active Problems:   Thrombocytopenia   Multiple myeloma   Anxiety   CKD stage 3 due to type 2 diabetes mellitus   Type 2 diabetes mellitus with stage 3 chronic kidney disease and hypertension   Hypertension associated with stage 3b chronic kidney disease due to type 2 diabetes mellitus   Dementia  Resolved Problems:   * No resolved hospital problems. *  Hospital Course:  77 y.o. female with medical history significant for dementia, hypertension, CKD 3, diabetes mellitus, anxiety. Patient presented to the ED from [facility] via EMS with reports of bloody in stool.   Assessment and Plan:  Hematochezia -Underwent flexible sigmoidoscopy this morning which showed internal hemorrhoids, no stigmata of bleeding noted on flex sig.  Shows 2 erosions in sigmoid colon.  No active bleeding noted. -Gastroenterology has signed off  Anemia -Likely dilutional effect, hemoglobin 10.7 this morning -No episode of bleeding since patient came to the hospital -Follow CBC in 1 week  Thrombocytopenia -Platelet count 67,000 -Patient has multiple myeloma, this is treatment related -Called and discussed with hematology Dr. Ellin Saba; he recommends no further treatment  Dementia -Oriented to person only -Dementia due to whole brain radiation for CNS lymphoma several years ago  Hypertension -Continue losartan  Diabetes mellitus type 2 -Hemoglobin A1c 5.6, not on medications  Multiple myeloma -Follows Dr. Ellin Saba -Last visit was 03/19/2023 -He is on Velcade, Revlimid, dexamethasone  CKD stage IIIa -Creatinine has  improved to 1.0 with IV fluids -At baseline       Consultants:  Procedures performed:  Disposition: Home Diet recommendation:  Discharge Diet Orders (From admission, onward)     Start     Ordered   04/03/23 0000  Diet - low sodium heart healthy        04/03/23 1310           Regular diet DISCHARGE MEDICATION: Allergies as of 04/03/2023       Reactions   Lotensin [benazepril]         Medication List     TAKE these medications    acyclovir 400 MG tablet Commonly known as: ZOVIRAX TAKE 1 TABLET BY MOUTH TWICE DAILY   aspirin EC 81 MG tablet Take 81 mg by mouth daily.   busPIRone 10 MG tablet Commonly known as: BUSPAR Take 20 mg by mouth 3 (three) times daily. For anxiety   Calcium 600+D Plus Minerals 600-400 MG-UNIT Chew Chew 1 tablet by mouth daily.   cholestyramine 4 g packet Commonly known as: QUESTRAN Take 4 g by mouth 2 (two) times daily between meals.   LACTOBACILLUS PROBIOTIC PO Take 2 capsules by mouth daily.   levothyroxine 25 MCG tablet Commonly known as: SYNTHROID Take 25 mcg by mouth daily before breakfast.   lipase/protease/amylase 40981 UNITS Cpep capsule Commonly known as: CREON 36,000-114,000- 180,000 unit; amt: 1; oral Special Instructions: Take one capsule with snacks if eaten between meals. Twice A Day Between Meals as needed   lipase/protease/amylase 19147 UNITS Cpep capsule Commonly known as: CREON Take 3 capsules by mouth 3 (three) times daily with meals.   loperamide 2  MG tablet Commonly known as: IMODIUM A-D Take 2 mg by mouth 3 (three) times daily. For diarrhea   losartan 50 MG tablet Commonly known as: COZAAR Take 1 tablet (50 mg total) by mouth daily.   NON FORMULARY Wanderguard #[id number] to ankle for safety awareness. Check placement and function qshift. Special Instructions: Check placement and function qshift. Every Shift Day, Evening, Night   NON FORMULARY Dysphagia 2 diet with thin liquids   potassium  chloride SA 20 MEQ tablet Commonly known as: KLOR-CON M Take 20 mEq by mouth daily.        Contact information for after-discharge care     Destination     [facility] Preferred SNF .   Service: Skilled Nursing Contact information: [address] [place] [address] [phone]                    Discharge Exam: Ceasar Mons Weights   04/02/23 1231  Weight: 52.6 kg   General-appears in no acute distress Heart-S1-S2, regular, no murmur auscultated Lungs-clear to auscultation bilaterally, no wheezing or crackles auscultated Abdomen-soft, nontender, no organomegaly Extremities-no edema in the lower extremities Neuro-alert, oriented x3, no focal deficit noted  Condition at discharge: good  The results of significant diagnostics from this hospitalization (including imaging, microbiology, ancillary and laboratory) are listed below for reference.   Imaging Studies: No results found.  Microbiology: Results for orders placed or performed in visit on 04/14/22  Urine culture     Status: Abnormal   Collection Time: 04/14/22  9:26 AM   Specimen: Urine, Clean Catch  Result Value Ref Range Status   Specimen Description   Final    URINE, CLEAN CATCH Performed at [facility], [address], [place], [place] [address]    Special Requests   Final    NONE Performed at [facility], [address], [place], [place] [address]    Culture >=100,000 COLONIES/mL ESCHERICHIA COLI (A)  Final   Report Status 04/18/2022 FINAL  Final   Organism ID, Bacteria ESCHERICHIA COLI (A)  Final      Susceptibility   Escherichia coli - MIC*    AMPICILLIN RESISTANT Resistant     CEFAZOLIN <=4 SENSITIVE Sensitive     CEFEPIME <=0.12 SENSITIVE Sensitive     CEFTRIAXONE <=0.25 SENSITIVE Sensitive     CIPROFLOXACIN 0.5 INTERMEDIATE Intermediate     GENTAMICIN <=1 SENSITIVE Sensitive     IMIPENEM <=0.25 SENSITIVE Sensitive     NITROFURANTOIN <=16 SENSITIVE Sensitive      TRIMETH/SULFA <=20 SENSITIVE Sensitive     AMPICILLIN/SULBACTAM 4 SENSITIVE Sensitive     PIP/TAZO 16 SENSITIVE Sensitive     * >=100,000 COLONIES/mL ESCHERICHIA COLI   *Note: Due to a large number of results and/or encounters for the requested time period, some results have not been displayed. A complete set of results can be found in Results Review.    Labs: CBC: Recent Labs  Lab 04/02/23 1349 04/02/23 2046 04/03/23 0535  WBC 5.5 5.4 4.1  NEUTROABS 4.3  --   --   HGB 11.5* 12.1 10.7*  HCT 34.6* 35.7* 32.1*  MCV 99.4 97.3 97.3  PLT 67* 65* 66*   Basic Metabolic Panel: Recent Labs  Lab 04/02/23 1349 04/03/23 0431  NA 138 140  K 3.7 3.7  CL 107 112*  CO2 23 21*  GLUCOSE 92 73  BUN 25* 18  CREATININE 1.23* 1.00  CALCIUM 8.6* 8.2*   Liver Function Tests: Recent Labs  Lab 04/02/23 1349  AST 36  ALT 30  ALKPHOS 65  BILITOT 0.5  PROT 6.7  ALBUMIN 3.5   CBG: Recent Labs  Lab 04/03/23 0837 04/03/23 1119 04/03/23 1212 04/03/23 1244  GLUCAP 89 89 71 149*    Discharge time spent: greater than 30 minutes.  Signed: Meredeth Ide, MD [facility] 04/03/2023

## 2023-04-03 NOTE — TOC Transition Note (Signed)
Transition of Care [facility]) - CM/SW Discharge Note   Patient Details  Name: Amanda Wu MRN: [id number] Date of Birth: [birth year]  Transition [facility] Care [facility]) CM/SW Contact:  Leitha Bleak, RN Phone Number: 04/03/2023, 1:00 PM   Clinical Narrative:   Patient is medically ready to DC back to [facility], MD will update sister. Lynnea Ferrier is requesting FL2 and Covid test. MD ordered, RN will call report after resulted.    Final next level of care: Long Term Acute Care (LTAC) Barriers to Discharge: Barriers Resolved   Patient Goals and CMS Choice CMS Medicare.gov Compare Post Acute Care list provided to:: Patient Represenative (must comment) Choice offered to / list presented to : Sibling  Discharge Placement               Patient chooses bed at:  [facility])   Name of family member notified: Pattricia Boss Patient and family notified of of transfer: 04/03/23  Discharge Plan and Services Additional resources added to the After Visit Summary for         Social Determinants of Health (SDOH) Interventions SDOH Screenings   Food Insecurity: No Food Insecurity (04/02/2023)  Housing: Low Risk  (04/02/2023)  Transportation Needs: No Transportation Needs (04/02/2023)  Utilities: Not At Risk (04/02/2023)  Alcohol Screen: Low Risk  (10/16/2020)  Depression (PHQ2-9): Low Risk  (01/14/2023)  Financial Resource Strain: Low Risk  (10/16/2020)  Physical Activity: Inactive (10/16/2020)  Social Connections: Moderately Isolated (10/16/2020)  Stress: No Stress Concern Present (10/16/2020)  Tobacco Use: Low Risk  (04/03/2023)

## 2023-04-03 NOTE — Anesthesia Postprocedure Evaluation (Signed)
Anesthesia Post Note  Patient: Amanda Wu  Procedure(s) Performed: FLEXIBLE SIGMOIDOSCOPY  Patient location during evaluation: Phase II Anesthesia Type: General Level of consciousness: awake and alert and oriented Pain management: pain level controlled Vital Signs Assessment: post-procedure vital signs reviewed and stable Respiratory status: spontaneous breathing, nonlabored ventilation and respiratory function stable Cardiovascular status: blood pressure returned to baseline and stable Postop Assessment: no apparent nausea or vomiting Anesthetic complications: no  No notable events documented.   Last Vitals:  Vitals:   04/03/23 1230 04/03/23 1245  BP: (!) 166/87 (!) 178/91  Pulse: (!) 59 64  Resp: 19 14  Temp:    SpO2: 100% 100%    Last Pain:  Vitals:   04/03/23 1245  TempSrc:   PainSc: 0-No pain                 Willisha Sligar C Georgeanna Radziewicz

## 2023-04-03 NOTE — Progress Notes (Signed)
We will proceed with flexible sigmoidoscopy as scheduled.  I thoroughly discussed with the patient's sister the procedure, including the risks involved. Patient understands what the procedure involves including the benefits and any risks. Patient understands alternatives to the proposed procedure. Risks including (but not limited to) bleeding, tearing of the lining (perforation), rupture of adjacent organs, problems with heart and lung function, infection, and medication reactions. A small percentage of complications may require surgery, hospitalization, repeat endoscopic procedure, and/or transfusion.  Patient's sister understood and agreed.  Katrinka Blazing, MD Gastroenterology and Hepatology [facility]

## 2023-04-03 NOTE — [place] FL2 (Signed)
MEDICAID FL2 LEVEL OF CARE FORM     IDENTIFICATION  Patient Name: Amanda Wu Birthdate: Apr 23, 1946 Sex: female Admission Date (Current Location): 04/02/2023  [facility] and [place] Number:  [employer] and Address:  [facility],  [address] [place] [address]      Provider Number: [id number]  Attending Physician Name and Address:  Meredeth Ide, MD  Relative Name and Phone Number:  Sallee, Hogrefe (Sister) [phone]    Current Level of Care: Hospital Recommended Level of Care: Nursing Facility Prior Approval Number:    Date Approved/Denied:   PASRR Number:    Discharge Plan: Other (Comment) (LTC)    Current Diagnoses: Patient Active Problem List   Diagnosis Date Noted   GI bleed 04/02/2023   Dementia 04/02/2023   Nondisplaced fracture of distal phalanx of right middle finger, initial encounter for closed fracture 03/17/2023   Chronic diarrhea 07/17/2022   Iron deficiency anemia 06/25/2022   Hypertension associated with stage 3b chronic kidney disease due to type 2 diabetes mellitus 04/11/2022   Type 2 diabetes mellitus with stage 3 chronic kidney disease and hypertension 10/08/2021   CKD stage 3 due to type 2 diabetes mellitus 07/16/2021   Anxiety 05/07/2021   Aortic atherosclerosis 01/07/2021   Pancreatic insufficiency 01/07/2021   Bradycardia 12/11/2020   First degree AV block 12/11/2020   Post-menopausal osteoporosis 08/15/2020   Vascular dementia with behavior disturbance 07/27/2020   Herpes 07/27/2020   Hypokalemia 07/27/2020   Macrocytic anemia 07/19/2020   Adult failure to thrive 07/19/2020   Multiple myeloma 07/09/2017   Hypothyroidism 07/09/2017   Thrombocytopenia 07/09/2017    Orientation RESPIRATION BLADDER Height & Weight     Self  Normal Incontinent Weight: 52.6 kg Height:   (175.3 cm)  BEHAVIORAL SYMPTOMS/MOOD NEUROLOGICAL BOWEL NUTRITION STATUS      Incontinent Diet (See DC summary)   AMBULATORY STATUS COMMUNICATION OF NEEDS Skin   Extensive Assist Verbally Normal                       Personal Care Assistance Level of Assistance  Bathing, Feeding, Dressing Bathing Assistance: Maximum assistance Feeding assistance: Limited assistance Dressing Assistance: Maximum assistance     Functional Limitations Info  Sight, Hearing, Speech Sight Info: Adequate Hearing Info: Adequate Speech Info: Adequate    SPECIAL CARE FACTORS FREQUENCY                       Contractures Contractures Info: Not present    Additional Factors Info  Code Status, Allergies Code Status Info: DNR Allergies Info: lotensin           Current Medications (04/03/2023):  This is the current hospital active medication list Current Facility-Administered Medications  Medication Dose Route Frequency Provider Last Rate Last Admin   0.9 %  sodium chloride infusion   Intravenous Continuous Tiffany Kocher, PA-C       [MAR Hold] acetaminophen (TYLENOL) tablet 650 mg  650 mg Oral Q6H PRN Emokpae, Ejiroghene E, MD       Or   [MAR Hold] acetaminophen (TYLENOL) suppository 650 mg  650 mg Rectal Q6H PRN Emokpae, Ejiroghene E, MD       [MAR Hold] busPIRone (BUSPAR) tablet 20 mg  20 mg Oral TID Emokpae, Ejiroghene E, MD   20 mg at 04/03/23 5621   lactated ringers infusion   Intravenous Continuous Molli Barrows, MD   Stopped at 04/03/23  1247   [MAR Hold] levothyroxine (SYNTHROID) tablet 25 mcg  25 mcg Oral QAC breakfast Emokpae, Ejiroghene E, MD   25 mcg at 04/03/23 0607   [MAR Hold] ondansetron (ZOFRAN) tablet 4 mg  4 mg Oral Q6H PRN Emokpae, Ejiroghene E, MD       Or   [MAR Hold] ondansetron (ZOFRAN) injection 4 mg  4 mg Intravenous Q6H PRN Emokpae, Ejiroghene E, MD       Facility-Administered Medications Ordered in Other Encounters  Medication Dose Route Frequency Provider Last Rate Last Admin   cyanocobalamin ((VITAMIN B-12)) 1000 MCG/ML injection            cyanocobalamin  ((VITAMIN B-12)) 1000 MCG/ML injection            denosumab (XGEVA) 120 MG/1.7ML injection            dexamethasone (DECADRON) 4 MG tablet            dexamethasone (DECADRON) 4 MG tablet            diphenhydrAMINE (BENADRYL) 50 MG/ML injection            diphenhydrAMINE (BENADRYL) 50 MG/ML injection            diphenhydrAMINE (BENADRYL) 50 MG/ML injection            epoetin alfa-epbx (RETACRIT) [address] UNIT/ML injection            epoetin alfa-epbx (RETACRIT) 60454 UNIT/ML injection            epoetin alfa-epbx (RETACRIT) 09811 UNIT/ML injection            fulvestrant (FASLODEX) 250 MG/5ML injection            lanreotide acetate (SOMATULINE DEPOT) 120 MG/0.5ML injection            lanreotide acetate (SOMATULINE DEPOT) 120 MG/0.5ML injection            lanreotide acetate (SOMATULINE DEPOT) 120 MG/0.5ML injection            lanreotide acetate (SOMATULINE DEPOT) 120 MG/0.5ML injection            lidocaine (PF) (XYLOCAINE) 1 % injection            magnesium sulfate 2 GM/50ML IVPB            octreotide (SANDOSTATIN LAR) 30 MG IM injection            octreotide (SANDOSTATIN LAR) 30 MG IM injection            palonosetron (ALOXI) 0.25 MG/5ML injection            palonosetron (ALOXI) 0.25 MG/5ML injection            palonosetron (ALOXI) 0.25 MG/5ML injection            palonosetron (ALOXI) 0.25 MG/5ML injection            potassium chloride 10 MEQ/100ML IVPB            potassium chloride SA (KLOR-CON M) 20 MEQ CR tablet            potassium chloride SA (KLOR-CON M) 20 MEQ CR tablet            prochlorperazine (COMPAZINE) 10 MG tablet            prochlorperazine (COMPAZINE) 10 MG tablet              Discharge Medications: Please see discharge summary for a list of discharge  medications.  Relevant Imaging Results:  Relevant Lab Results:   Additional Information SS# [id number]  Leitha Bleak, RN

## 2023-04-03 NOTE — Transfer of Care (Signed)
Immediate Anesthesia Transfer of Care Note  Patient: Amanda Wu  Procedure(s) Performed: FLEXIBLE SIGMOIDOSCOPY  Patient Location: PACU  Anesthesia Type:General  Level of Consciousness: drowsy  Airway & Oxygen Therapy: Patient Spontanous Breathing and Patient connected to nasal cannula oxygen  Post-op Assessment: Report given to RN and Post -op Vital signs reviewed and stable  Post vital signs: Reviewed and stable  Last Vitals:  Vitals Value Taken Time  BP 147/76 04/03/23 1207  Temp    Pulse 60 04/03/23 1207  Resp 17 04/03/23 1208  SpO2 100 % 04/03/23 1207  Vitals shown include unvalidated device data.  Last Pain:  Vitals:   04/03/23 1152  TempSrc:   PainSc: 0-No pain         Complications: No notable events documented.

## 2023-04-03 NOTE — Op Note (Signed)
[facility] Patient Name: Amanda Wu Procedure Date: 04/03/2023 11:43 AM MRN: [id number] Date of Birth: [birth year] Attending MD: Katrinka Blazing , , [id number] CSN: [id number] Age: 77 Admit Type: Inpatient Procedure:                Flexible Sigmoidoscopy Indications:              Rectal hemorrhage Providers:                Katrinka Blazing, Sheran Fava, Kristine L.                            Jessee Avers, Technician Referring MD:              Medicines:                Monitored Anesthesia Care Complications:            No immediate complications. Estimated Blood Loss:     Estimated blood loss: none. Procedure:                Pre-Anesthesia Assessment:                           - Prior to the procedure, a History and Physical                            was performed, and patient medications, allergies                            and sensitivities were reviewed. The patient's                            tolerance of previous anesthesia was reviewed.                           - The risks and benefits of the procedure and the                            sedation options and risks were discussed with the                            patient. All questions were answered and informed                            consent was obtained.                           - ASA Grade Assessment: III - A patient with severe                            systemic disease.                           After obtaining informed consent, the scope was                            passed under direct vision. The [id number]-HQ190L                            (  [id number]) scope was introduced through the anus and                            advanced to the the sigmoid colon. The flexible                            sigmoidoscopy was accomplished without difficulty.                            The patient tolerated the procedure well. The                            quality of the bowel preparation was poor. Scope In: 11:56:19  AM Scope Out: 12:01:57 PM Total Procedure Duration: 0 hours 5 minutes 38 seconds  Findings:      The perianal and digital rectal examinations were normal.      The sigmoid colon appeared normal. There was presence of some brown       stool.      Two localized non-bleeding erosions were found in the rectum. These were       likely related to enemas used earlier.      Non-bleeding internal hemorrhoids were found during retroflexion. The       hemorrhoids were medium-sized.      Rectal bleeding likely from hemorrhoids, self limited - has resolved. Impression:               - Preparation of the colon was poor.                           - The sigmoid colon is normal.                           - Two erosions in the rectum.                           - Non-bleeding internal hemorrhoids.                           - No specimens collected. Moderate Sedation:      Per Anesthesia Care Recommendation:           - Return patient to hospital ward for possible                            discharge same day.                           - Resume previous diet. Procedure Code(s):        --- Professional ---                           [phone], Sigmoidoscopy, flexible; diagnostic,                            including collection of specimen(s) by brushing or  washing, when performed (separate procedure) Diagnosis Code(s):        --- Professional ---                           K64.8, Other hemorrhoids                           K62.6, Ulcer of anus and rectum                           K62.5, Hemorrhage of anus and rectum CPT copyright 2022 American [employer] Association. All rights reserved. The codes documented in this report are preliminary and upon coder review may  be revised to meet current compliance requirements. Katrinka Blazing, MD Katrinka Blazing,  04/03/2023 12:11:29 PM This report has been signed electronically. Number of Addenda: 0

## 2023-04-03 NOTE — TOC Progression Note (Signed)
  Transition of Care [facility]) Screening Note   Patient Details  Name: Amanda Wu Date of Birth: Apr 21, 1946   [facility] CM/SW Contact:    Leitha Bleak, RN Phone Number: 04/03/2023, 11:11 AM    Transition of Care Department [facility]) has reviewed patient and no TOC needs have been identified at this time. We will continue to monitor patient advancement through interdisciplinary progression rounds. If new patient transition needs arise, please place a TOC consult.      Barriers to Discharge: Continued Medical Work up  Expected Discharge Plan and Services          Social Determinants of Health (SDOH) Interventions SDOH Screenings   Food Insecurity: No Food Insecurity (04/02/2023)  Housing: Low Risk  (04/02/2023)  Transportation Needs: No Transportation Needs (04/02/2023)  Utilities: Not At Risk (04/02/2023)  Alcohol Screen: Low Risk  (10/16/2020)  Depression (PHQ2-9): Low Risk  (01/14/2023)  Financial Resource Strain: Low Risk  (10/16/2020)  Physical Activity: Inactive (10/16/2020)  Social Connections: Moderately Isolated (10/16/2020)  Stress: No Stress Concern Present (10/16/2020)  Tobacco Use: Low Risk  (04/02/2023)    Readmission Risk Interventions     No data to display

## 2023-04-03 NOTE — Brief Op Note (Signed)
04/03/2023  12:05 PM  PATIENT:  Amanda Wu  77 y.o. female  PRE-OPERATIVE DIAGNOSIS:  rectal bleeding  POST-OPERATIVE DIAGNOSIS:  hemorrhoids  PROCEDURE:  Procedure(s): FLEXIBLE SIGMOIDOSCOPY (N/A)  SURGEON:  Surgeon(s) and Role:    * Dolores Frame, MD - Primary  Patient underwent flexible sigmoidoscopy under propofol sedation.  Tolerated the procedure adequately.    FINDINGS: -Two localized non-bleeding erosions were found in the rectum. These were likely related to enemas used earlier. - Non-bleeding internal hemorrhoids were found during retroflexion.  The hemorrhoids were medium-sized.   Rectal  bleeding likely from hemorrhoids, self limited - has resolved.  RECOMMENDATIONS - Return patient to hospital ward for possible discharge same day.  - Resume previous diet.  - GI service will sign-off, please call us back if you have any more questions.  Katrinka Blazing, MD Gastroenterology and Hepatology [facility]

## 2023-04-03 NOTE — Anesthesia Procedure Notes (Signed)
Date/Time: 04/03/2023 11:56 AM  Performed by: Julian Reil, CRNAPre-anesthesia Checklist: Patient identified, Emergency Drugs available, Suction available and Patient being monitored Patient Re-evaluated:Patient Re-evaluated prior to induction Oxygen Delivery Method: Nasal cannula Induction Type: IV induction Placement Confirmation: positive ETCO2

## 2023-04-03 NOTE — Anesthesia Preprocedure Evaluation (Addendum)
Anesthesia Evaluation  Patient identified by MRN, date of birth, ID band Patient awake and Patient confused    Reviewed: Allergy & Precautions, H&P , NPO status , Patient's Chart, lab work & pertinent test results  History of Anesthesia Complications Negative for: history of anesthetic complications  Airway Mallampati: II  TM Distance: >3 FB Neck ROM: Full    Dental  (+) Dental Advisory Given, Edentulous Upper, Missing   Pulmonary neg pulmonary ROS   Pulmonary exam normal breath sounds clear to auscultation       Cardiovascular Exercise Tolerance: Poor hypertension, Pt. on medications Normal cardiovascular exam+ dysrhythmias  Rhythm:Regular Rate:Normal     Neuro/Psych  PSYCHIATRIC DISORDERS Anxiety    Dementia CNS lymphoma    GI/Hepatic negative GI ROS, Neg liver ROS,,,  Endo/Other  diabetes, Well Controlled, Type 2, Oral Hypoglycemic AgentsHypothyroidism    Renal/GU Renal InsufficiencyRenal disease  negative genitourinary   Musculoskeletal negative musculoskeletal ROS (+)    Abdominal   Peds negative pediatric ROS (+)  Hematology  (+) Blood dyscrasia (thrombocytopenia, CNS lymphoma), anemia   Anesthesia Other Findings Multiple myeloma  Reproductive/Obstetrics negative OB ROS                             Anesthesia Physical Anesthesia Plan  ASA: 3  Anesthesia Plan: General   Post-op Pain Management: Minimal or no pain anticipated   Induction: Intravenous  PONV Risk Score and Plan: 1 and Propofol infusion  Airway Management Planned: Nasal Cannula and Natural Airway  Additional Equipment:   Intra-op Plan:   Post-operative Plan:   Informed Consent: I have reviewed the patients History and Physical, chart, labs and discussed the procedure including the risks, benefits and alternatives for the proposed anesthesia with the patient or authorized representative who has indicated  his/her understanding and acceptance.    Discussed DNR with power of attorney and Suspend DNR.   Consent reviewed with POA  Plan Discussed with: CRNA and Surgeon  Anesthesia Plan Comments: (Dicussed with sister regarding DNR, in case of cardiac arrest, agreed her sister to get brief resuscitation, Doesn't want her sister to get prolonged resuscitation or vent dependent. In case of temporary respiratory distress from anesthesia or aspiration, agreed to get intubation and supportive care.)        Anesthesia Quick Evaluation

## 2023-04-06 ENCOUNTER — Non-Acute Institutional Stay (SKILLED_NURSING_FACILITY): Payer: Medicare PPO | Admitting: Adult Health

## 2023-04-06 ENCOUNTER — Encounter: Payer: Self-pay | Admitting: Adult Health

## 2023-04-06 DIAGNOSIS — I129 Hypertensive chronic kidney disease with stage 1 through stage 4 chronic kidney disease, or unspecified chronic kidney disease: Secondary | ICD-10-CM | POA: Diagnosis not present

## 2023-04-06 DIAGNOSIS — E1122 Type 2 diabetes mellitus with diabetic chronic kidney disease: Secondary | ICD-10-CM

## 2023-04-06 DIAGNOSIS — K625 Hemorrhage of anus and rectum: Secondary | ICD-10-CM

## 2023-04-06 DIAGNOSIS — D696 Thrombocytopenia, unspecified: Secondary | ICD-10-CM

## 2023-04-06 DIAGNOSIS — M810 Age-related osteoporosis without current pathological fracture: Secondary | ICD-10-CM

## 2023-04-06 DIAGNOSIS — N183 Chronic kidney disease, stage 3 unspecified: Secondary | ICD-10-CM

## 2023-04-06 NOTE — Progress Notes (Unsigned)
Location:  [facility] Room Number: 18 W Place of Service:  SNF (31)   CODE STATUS: DNR  Allergies  Allergen Reactions   Lotensin [Benazepril]     Chief Complaint  Patient presents with   Medical Management of Chronic Issues             Gastrointestinal hemorrhage associated with anorectal source:  Diabetes mellitus type 2 with stage 3 chronic kidney disease and hypertension:  Post menopausal osteoporosis: Thrombocytopenia     HPI:  She is a 77 year old long term resident of this facility being seen for the management of her chronic illnesses: Gastrointestinal hemorrhage associated with anorectal source:  Diabetes mellitus type 2 with stage 3 chronic kidney disease and hypertension:  Post menopausal osteoporosis: Thrombocytopenia. She had an overnight stay at [facility] due to rectal bleeding. Found to be from internal hemorrhoids. She denies any pain. She continues to get out of bed daily; and propels self in hallway.   Past Medical History:  Diagnosis Date   Benign hypertension    Cataract    Central nervous system lymphoma    Dementia    Diabetes mellitus with CKD    H/O partial nephrectomy    Hypokalemia    Hypothyroidism    Impaired cognition    Multiple myeloma    Dr Kayleen Memos, [facility]    Past Surgical History:  Procedure Laterality Date   ABDOMINAL HYSTERECTOMY     CRANIOTOMY     for lymphoma   YAG LASER APPLICATION Right 07/02/2015   Procedure: YAG LASER APPLICATION;  Surgeon: Susa Simmonds, MD;  Location: [facility];  Service: Ophthalmology;  Laterality: Right;    Social History   Socioeconomic History   Marital status: Single    Spouse name: Not on file   Number of children: Not on file   Years of education: Not on file   Highest education level: Not on file  Occupational History   Occupation: retired   Tobacco Use   Smoking status: Never   Smokeless tobacco: Never  Vaping Use   Vaping Use: Never used  Substance and Sexual  Activity   Alcohol use: No   Drug use: No   Sexual activity: Never  Other Topics Concern   Not on file  Social History Narrative   Long term resident of [facility]    Social Determinants of Health   Financial Resource Strain: Low Risk  (10/16/2020)   Overall Financial Resource Strain (CARDIA)    Difficulty of Paying Living Expenses: Not very hard  Food Insecurity: No Food Insecurity (04/02/2023)   Hunger Vital Sign    Worried About Running Out of Food in the Last Year: Never true    Ran Out of Food in the Last Year: Never true  Transportation Needs: No Transportation Needs (04/02/2023)   PRAPARE - Administrator, Civil Service (Medical): No    Lack of Transportation (Non-Medical): No  Physical Activity: Inactive (10/16/2020)   Exercise Vital Sign    Days of Exercise per Week: 0 days    Minutes of Exercise per Session: 0 min  Stress: No Stress Concern Present (10/16/2020)   [employer] of Occupational Health - Occupational Stress Questionnaire    Feeling of Stress : Not at all  Social Connections: Moderately Isolated (10/16/2020)   Social Connection and Isolation Panel [NHANES]    Frequency of Communication with Friends and Family: More than three times a week    Frequency of  Social Gatherings with Friends and Family: Once a week    Attends Religious Services: More than 4 times per year    Active Member of [employer] or Organizations: No    Attends Banker Meetings: Never    Marital Status: Never married  Intimate Partner Violence: Not At Risk (04/02/2023)   Humiliation, Afraid, Rape, and Kick questionnaire    Fear of Current or Ex-Partner: No    Emotionally Abused: No    Physically Abused: No    Sexually Abused: No   Family History  Problem Relation Age of Onset   Depression Mother    Diabetes Mother    Heart disease Father    Cancer - Prostate Brother    Cancer Brother    Cancer Sister       VITAL SIGNS BP 119/81   Pulse 96   Temp (!) 97 F (36.1  C)   Resp 20   Ht  (1.753 m)   Wt 121 lb (54.9 kg)   SpO2 98%   BMI 17.87 kg/m   Outpatient Encounter Medications as of 04/06/2023  Medication Sig   acyclovir (ZOVIRAX) 400 MG tablet TAKE 1 TABLET BY MOUTH TWICE DAILY   aspirin EC 81 MG tablet Take 81 mg by mouth daily.   busPIRone (BUSPAR) 10 MG tablet Take 20 mg by mouth 3 (three) times daily. For anxiety   Calcium Carbonate-Vit D-Min (CALCIUM 600+D PLUS MINERALS) 600-400 MG-UNIT CHEW Chew 1 tablet by mouth daily.   cholestyramine (QUESTRAN) 4 g packet Take 4 g by mouth 2 (two) times daily between meals.   LACTOBACILLUS PROBIOTIC PO Take 2 capsules by mouth daily.   levothyroxine (SYNTHROID, LEVOTHROID) 25 MCG tablet Take 25 mcg by mouth daily before breakfast.   lipase/protease/amylase (CREON) 36000 UNITS CPEP capsule 36,000-114,000- 180,000 unit; amt: 1; oral Special Instructions: Take one capsule with snacks if eaten between meals. Twice A Day Between Meals as needed   lipase/protease/amylase (CREON) 36000 UNITS CPEP capsule Take 3 capsules by mouth 3 (three) times daily with meals.   loperamide (IMODIUM A-D) 2 MG tablet Take 2 mg by mouth 3 (three) times daily. For diarrhea   losartan (COZAAR) 50 MG tablet Take 1 tablet (50 mg total) by mouth daily.   NON FORMULARY Dysphagia 2 diet with thin liquids   NON FORMULARY Wanderguard #2237 to ankle for safety awareness. Check placement and function qshift. Special Instructions: Check placement and function qshift. Every Shift Day, Evening, Night   potassium chloride SA (KLOR-CON) 20 MEQ tablet Take 20 mEq by mouth daily.       SIGNIFICANT DIAGNOSTIC EXAMS  PREVIOUS   08-10-20: t score: -3.758  03-16-22: chest x-ray:  No radiographic evidence of acute cardiopulmonary disease Mild cardiomegaly  Mild osteopenia Mild osteoarthritis   03-15-23: right hand x-ray:  On lateral view: minimal linear lucency in base of mid phalanx 3rd digit compatible with acute nondisplaced  hairline fracture versus vascular channel artifact.  Moderate soft tissue swelling about PIP joint 3rd digit Mild osteopenia Mild degree of osteoarthritis   TODAY  04-03-23: flex sig; bleeding from internal hemorrhoids; self limited      LABS REVIEWED PREVIOUS    03-17-22: wbc 3.6; hgb 10.9; hct 34.7; mcv 97.2 plt 121; glucose 140; bun 20; creat 1.16; k+ 3.6; na++ 144; ca 9.2 GFR 49; protein 6.7; albumin 3.4 ast 69 03-17-22: wbc 4.1; hgb 10.9; hct 32.5; mcv 96.2 plt 98; glucose 96; bbun 22; creat 1.17; k+ 3.5; na++ 142; ca  9.0; GFR 48 d-dimer 1.67; CRP 1.2  03-24-22: d-dimer 0.72 05-13-22: wbc 5.2; hgb 10.7; hct 32.6; mcv 98.5 plt 148; glucose 85; bun 21; creat 1.31; k+ 3.4; na++ 141; ca 9.0; gfr 42; protein 6.5; albumin 3.3  05-15-22: hgb a1c 5.4 05-20-22: wbc 4.5; hgb 11.6; hct 35.6; mcv 97.8 plt 124 06-09-22: wbc 4.7; hgb 10.3; hct 32.0; mcv 97.6 plt 164; glucose 94; bun 23; creat 1.11; k+ 3.8; na++ 141; ca 9.1; gfr 51 protein 6.2 albumin 3.2 tah 3.485 06-25-22: wbc 4.2; hgb 10.7; hct 33.8; mcv 98.3 plt 110; glucose 97; bun 21; creat 1.10; k+ 3.6; na++ 140; ca 8.9; gfr 52; protein 6.4; albumin 3.3 08-06-22: wbc 4.4; hgb 11.0; hct 34.5; mcv 99.4 plt 130; glucose 138; bun 24; creat 1.38; k+ 4.1; na++ 141; ca 8.9; gfr 39; protein 6.5; albumin 3.2; mag 2.3 09-09-22: wbc 5.1; hgb 11.3; hct 34.5; mcv 99.4 plt 132; glucose 119; bun 22; creat 1.29; k+ 4.1; na++ 143 ca 8.8; gfr 42; protein 6.6 albumin 3.2; mag 2.2   10-09-22: wbc 4.3; hgb 11.3; hct 35.0; mcv 101.7 plt 93; glucose 147; bun 21; creat 1.15; k+ 3.6; na++ 146; ca 9.0; gfr 49; protein 6.8; albumin 3.3  12-24-22: wbc 4.9; hgb 11.8; hct 36.1; mcv 101.4 plt 154; glucose 95; bun 21; creat 1.35; k+ 4.1; na++ 141; ca 9.1; gfr 40; mag 2.4; protein 6.7; albumin 3.5  01-19-23: hgb A1c; 5.6; micro/creat ratio: 6  01-21-23: wbc 4.5 hgb 11.7; hct 35.5; mcv 101.7 plt 132; glucose 110; bun 28;creat 1.59; k+ 4.1; na++ 140; ca 8.8; gfr 33; protein 6.7 albumin 3.4 mag  2.5 02-25-23; wbc 3.1; hgb 11.4; hct 34.5; mcv 101.5 plt 88; glucose 116; bun 24; creat 1.52; k+ 4.1; na++ 143; ca 8.7; gfr 35; protein 6.5; albumin 3.2 mag 2.3  TODAY  04-03-23: wbc 4.1; hgb 10.7; hct 32.1; mcv 97.3 plt 66; glucose 73; bun 18; creat 1.00; k+ 3.7; na++ 140; ca 8.2 gfr 58 #2: wbc 4.1; hgb 11.4; hct 34.0; mcv 98.3 plt 69    Review of Systems  Constitutional:  Negative for malaise/fatigue.  Respiratory:  Negative for cough and shortness of breath.   Cardiovascular:  Negative for chest pain, palpitations and leg swelling.  Gastrointestinal:  Negative for abdominal pain, constipation and heartburn.  Musculoskeletal:  Negative for back pain, joint pain and myalgias.  Skin: Negative.   Neurological:  Negative for dizziness.  Psychiatric/Behavioral:  The patient is not nervous/anxious.    Physical Exam Constitutional:      General: She is not in acute distress.    Appearance: She is well-developed. She is not diaphoretic.  Neck:     Thyroid: No thyromegaly.  Cardiovascular:     Rate and Rhythm: Normal rate and regular rhythm.     Pulses: Normal pulses.     Heart sounds: Normal heart sounds.  Pulmonary:     Effort: Pulmonary effort is normal. No respiratory distress.     Breath sounds: Normal breath sounds.  Abdominal:     General: Bowel sounds are normal. There is no distension.     Palpations: Abdomen is soft.     Tenderness: There is no abdominal tenderness.  Musculoskeletal:        General: Normal range of motion.     Cervical back: Neck supple.     Right lower leg: No edema.     Left lower leg: No edema.  Lymphadenopathy:     Cervical: No cervical adenopathy.  Skin:  General: Skin is warm and dry.  Neurological:     Mental Status: She is alert. Mental status is at baseline.  Psychiatric:        Mood and Affect: Mood normal.         ASSESSMENT/ PLAN:  TODAY  Gastrointestinal hemorrhage associated with anorectal source: is status post flex sig:  internal hemorrhoids; no stigmata of bleeding present. Hgb stable.   2. Diabetes mellitus type 2 with stage 3 chronic kidney disease and hypertension: hgb A1c 5.4 is on arb and statin  3. Post menopausal osteoporosis: t score -3.758; will continue supplements  4. Thrombocytopenia: plt 69 will monitor   PREVIOUS    5. Multiple myeloma without achieving remission: is followed by oncology  6. Aortic atherosclerosis (ct 08-25-14) is on statin and asa   7. First degree av block/bradycardia: seen by cardiology no further interventions.   8. Pancreatic insufficiency: will continue questran 8 gm twice daily; creon 180,000 units with meals; and imodium 2 mg three times daily   9. Macrocytic anemia: hgb 11.4; vitamin B 12: 620; is off iron due to diarrhea  10. Vascular dementia with behavioral disturbance: weight is 121; pounds will continue buspar 20 mg three times daily for anxiety; is off aricept  11. Herpes: no recent outbreak; is on acyclovir 400 mg twice daily   12. Failure to thrive in adult: weight is stable at 121 pounds.   13. Hypokalemia: k+ 3.7 will continue k+ 20 meq daily   14. Hypothyroidism unspecified type: tsh 3.485 is on synthroid 25 mcg daily   15. Hypertension associated with stage 3b chronic kidney disease due to type 2 diabetes mellitus: b/p 119/81 will continue cozaar 50 mg daily asa 81 mg daily   Synthia Innocent NP [facility]   call [phone]

## 2023-04-07 ENCOUNTER — Encounter (HOSPITAL_COMMUNITY): Payer: Self-pay | Admitting: Gastroenterology

## 2023-04-15 ENCOUNTER — Inpatient Hospital Stay: Payer: Medicare PPO | Attending: Hematology

## 2023-04-15 ENCOUNTER — Inpatient Hospital Stay: Payer: Medicare PPO

## 2023-04-15 DIAGNOSIS — C9000 Multiple myeloma not having achieved remission: Secondary | ICD-10-CM | POA: Diagnosis not present

## 2023-04-15 DIAGNOSIS — Z5112 Encounter for antineoplastic immunotherapy: Secondary | ICD-10-CM | POA: Insufficient documentation

## 2023-04-15 LAB — CBC WITH DIFFERENTIAL/PLATELET
Abs Immature Granulocytes: 0 10*3/uL (ref 0.00–0.07)
Basophils Absolute: 0 10*3/uL (ref 0.0–0.1)
Basophils Relative: 1 %
Eosinophils Absolute: 0.2 10*3/uL (ref 0.0–0.5)
Eosinophils Relative: 6 %
HCT: 31.7 % — ABNORMAL LOW (ref 36.0–46.0)
Hemoglobin: 10.4 g/dL — ABNORMAL LOW (ref 12.0–15.0)
Immature Granulocytes: 0 %
Lymphocytes Relative: 17 %
Lymphs Abs: 0.6 10*3/uL — ABNORMAL LOW (ref 0.7–4.0)
MCH: 32.9 pg (ref 26.0–34.0)
MCHC: 32.8 g/dL (ref 30.0–36.0)
MCV: 100.3 fL — ABNORMAL HIGH (ref 80.0–100.0)
Monocytes Absolute: 0.3 10*3/uL (ref 0.1–1.0)
Monocytes Relative: 10 %
Neutro Abs: 2.1 10*3/uL (ref 1.7–7.7)
Neutrophils Relative %: 66 %
Platelets: 101 10*3/uL — ABNORMAL LOW (ref 150–400)
RBC: 3.16 MIL/uL — ABNORMAL LOW (ref 3.87–5.11)
RDW: 15.5 % (ref 11.5–15.5)
WBC: 3.2 10*3/uL — ABNORMAL LOW (ref 4.0–10.5)
nRBC: 0 % (ref 0.0–0.2)

## 2023-04-15 LAB — COMPREHENSIVE METABOLIC PANEL
ALT: 25 U/L (ref 0–44)
AST: 33 U/L (ref 15–41)
Albumin: 3.1 g/dL — ABNORMAL LOW (ref 3.5–5.0)
Alkaline Phosphatase: 58 U/L (ref 38–126)
Anion gap: 7 (ref 5–15)
BUN: 19 mg/dL (ref 8–23)
CO2: 23 mmol/L (ref 22–32)
Calcium: 8.8 mg/dL — ABNORMAL LOW (ref 8.9–10.3)
Chloride: 108 mmol/L (ref 98–111)
Creatinine, Ser: 1.3 mg/dL — ABNORMAL HIGH (ref 0.44–1.00)
GFR, Estimated: 42 mL/min — ABNORMAL LOW (ref >60)
Glucose, Bld: 90 mg/dL (ref 70–99)
Potassium: 3.8 mmol/L (ref 3.5–5.1)
Sodium: 138 mmol/L (ref 135–145)
Total Bilirubin: 0.8 mg/dL (ref 0.3–1.2)
Total Protein: 6.1 g/dL — ABNORMAL LOW (ref 6.5–8.1)

## 2023-04-15 LAB — MAGNESIUM: Magnesium: 2.2 mg/dL (ref 1.7–2.4)

## 2023-04-16 ENCOUNTER — Inpatient Hospital Stay: Payer: Medicare PPO | Admitting: Hematology

## 2023-04-16 ENCOUNTER — Inpatient Hospital Stay: Payer: Medicare PPO

## 2023-04-16 VITALS — BP 144/83 | HR 100 | Temp 96.6°F | Resp 18 | Wt 118.2 lb

## 2023-04-16 DIAGNOSIS — C9000 Multiple myeloma not having achieved remission: Secondary | ICD-10-CM

## 2023-04-16 DIAGNOSIS — Z5112 Encounter for antineoplastic immunotherapy: Secondary | ICD-10-CM | POA: Diagnosis not present

## 2023-04-16 MED ORDER — BORTEZOMIB CHEMO SQ INJECTION 3.5 MG (2.5MG/ML)
1.3000 mg/m2 | Freq: Once | INTRAMUSCULAR | Status: AC
  Administered 2023-04-16: 2 mg via SUBCUTANEOUS
  Filled 2023-04-16: qty 0.8

## 2023-04-16 MED ORDER — DEXAMETHASONE 4 MG PO TABS
10.0000 mg | ORAL_TABLET | Freq: Once | ORAL | Status: AC
  Administered 2023-04-16: 10 mg via ORAL
  Filled 2023-04-16: qty 3

## 2023-04-16 NOTE — Progress Notes (Signed)
Patient presents today for Velcade injection.  Patient is in satisfactory condition with no new complaints voiced.  Vital signs are stable.  Labs reviewed and all labs are within treatment parameters.  We will proceed with treatment per MD orders.

## 2023-04-16 NOTE — Progress Notes (Signed)
Patient tolerated treatment well with no complaints voiced.  Patient via wheelchair with caregiver in stable condition. Follow up as scheduled.

## 2023-04-16 NOTE — Patient Instructions (Signed)
[facility]  Discharge Instructions: Thank you for choosing [facility] to provide your oncology and hematology care.  If you have a lab appointment with the Cancer Center - please note that after April 8th, 2024, all labs will be drawn in the cancer center.  You do not have to check in or register with the main entrance as you have in the past but will complete your check-in in the cancer center.  Wear comfortable clothing and clothing appropriate for easy access to any Portacath or PICC line.   We strive to give you quality time with your provider. You may need to reschedule your appointment if you arrive late (15 or more minutes).  Arriving late affects you and other patients whose appointments are after yours.  Also, if you miss three or more appointments without notifying the office, you may be dismissed from the clinic at the provider's discretion.      For prescription refill requests, have your pharmacy contact our office and allow 72 hours for refills to be completed.    Today you received the following chemotherapy and/or immunotherapy agents Velcade.  Bortezomib Injection What is this medication? BORTEZOMIB (bor TEZ oh mib) treats lymphoma. It may also be used to treat multiple myeloma, a type of bone marrow cancer. It works by blocking a protein that causes cancer cells to grow and multiply. This helps to slow or stop the spread of cancer cells. This medicine may be used for other purposes; ask your health care provider or pharmacist if you have questions. COMMON BRAND NAME(S): Velcade What should I tell my care team before I take this medication? They need to know if you have any of these conditions: Dehydration Diabetes Heart disease Liver disease Tingling of the fingers or toes or other nerve disorder An unusual or allergic reaction to bortezomib, other medications, foods, dyes, or preservatives If you or your partner are pregnant or  trying to get pregnant Breastfeeding How should I use this medication? This medication is injected into a vein or under the skin. It is given by your care team in a hospital or clinic setting. Talk to your care team about the use of this medication in children. Special care may be needed. Overdosage: If you think you have taken too much of this medicine contact a poison control center or emergency room at once. NOTE: This medicine is only for you. Do not share this medicine with others. What if I miss a dose? Keep appointments for follow-up doses. It is important not to miss your dose. Call your care team if you are unable to keep an appointment. What may interact with this medication? Ketoconazole Rifampin This list may not describe all possible interactions. Give your health care provider a list of all the medicines, herbs, non-prescription drugs, or dietary supplements you use. Also tell them if you smoke, drink alcohol, or use illegal drugs. Some items may interact with your medicine. What should I watch for while using this medication? Your condition will be monitored carefully while you are receiving this medication. You may need blood work while taking this medication. This medication may affect your coordination, reaction time, or judgment. Do not drive or operate machinery until you know how this medication affects you. Sit up or stand slowly to reduce the risk of dizzy or fainting spells. Drinking alcohol with this medication can increase the risk of these side effects. This medication may increase your risk of getting an infection.  Call your care team for advice if you get a fever, chills, sore throat, or other symptoms of a cold or flu. Do not treat yourself. Try to avoid being around people who are sick. Check with your care team if you have severe diarrhea, nausea, and vomiting, or if you sweat a lot. The loss of too much body fluid may make it dangerous for you to take this  medication. Talk to your care team if you may be pregnant. Serious birth defects can occur if you take this medication during pregnancy and for 7 months after the last dose. You will need a negative pregnancy test before starting this medication. Contraception is recommended while taking this medication and for 7 months after the last dose. Your care team can help you find the option that works for you. If your partner can get pregnant, use a condom during sex while taking this medication and for 4 months after the last dose. Do not breastfeed while taking this medication and for 2 months after the last dose. This medication may cause infertility. Talk to your care team if you are concerned about your fertility. What side effects may I notice from receiving this medication? Side effects that you should report to your care team as soon as possible: Allergic reactions--skin rash, itching, hives, swelling of the face, lips, tongue, or throat Bleeding--bloody or black, tar-like stools, vomiting blood or brown material that looks like coffee grounds, red or dark brown urine, small red or purple spots on skin, unusual bruising or bleeding Bleeding in the brain--severe headache, stiff neck, confusion, dizziness, change in vision, numbness or weakness of the face, arm, or leg, trouble speaking, trouble walking, vomiting Bowel blockage--stomach cramping, unable to have a bowel movement or pass gas, loss of appetite, vomiting Heart failure--shortness of breath, swelling of the ankles, feet, or hands, sudden weight gain, unusual weakness or fatigue Infection--fever, chills, cough, sore throat, wounds that don't heal, pain or trouble when passing urine, general feeling of discomfort or being unwell Liver injury--right upper belly pain, loss of appetite, nausea, light-colored stool, dark yellow or brown urine, yellowing skin or eyes, unusual weakness or fatigue Low blood pressure--dizziness, feeling faint or  lightheaded, blurry vision Lung injury--shortness of breath or trouble breathing, cough, spitting up blood, chest pain, fever Pain, tingling, or numbness in the hands or feet Severe or prolonged diarrhea Stomach pain, bloody diarrhea, pale skin, unusual weakness or fatigue, decrease in the amount of urine, which may be signs of hemolytic uremic syndrome Sudden and severe headache, confusion, change in vision, seizures, which may be signs of posterior reversible encephalopathy syndrome (PRES) TTP--purple spots on the skin or inside the mouth, pale skin, yellowing skin or eyes, unusual weakness or fatigue, fever, fast or irregular heartbeat, confusion, change in vision, trouble speaking, trouble walking Tumor lysis syndrome (TLS)--nausea, vomiting, diarrhea, decrease in the amount of urine, dark urine, unusual weakness or fatigue, confusion, muscle pain or cramps, fast or irregular heartbeat, joint pain Side effects that usually do not require medical attention (report to your care team if they continue or are bothersome): Constipation Diarrhea Fatigue Loss of appetite Nausea This list may not describe all possible side effects. Call your doctor for medical advice about side effects. You may report side effects to FDA at 1-800-FDA-1088. Where should I keep my medication? This medication is given in a hospital or clinic. It will not be stored at home. NOTE: This sheet is a summary. It may not cover all possible information.  If you have questions about this medicine, talk to your doctor, pharmacist, or health care provider.  2023 Elsevier/Gold Standard (2022-04-30 00:00:00)       To help prevent nausea and vomiting after your treatment, we encourage you to take your nausea medication as directed.  BELOW ARE SYMPTOMS THAT SHOULD BE REPORTED IMMEDIATELY: *FEVER GREATER THAN 100.4 F (38 C) OR HIGHER *CHILLS OR SWEATING *NAUSEA AND VOMITING THAT IS NOT CONTROLLED WITH YOUR NAUSEA  MEDICATION *UNUSUAL SHORTNESS OF BREATH *UNUSUAL BRUISING OR BLEEDING *URINARY PROBLEMS (pain or burning when urinating, or frequent urination) *BOWEL PROBLEMS (unusual diarrhea, constipation, pain near the anus) TENDERNESS IN MOUTH AND THROAT WITH OR WITHOUT PRESENCE OF ULCERS (sore throat, sores in mouth, or a toothache) UNUSUAL RASH, SWELLING OR PAIN  UNUSUAL VAGINAL DISCHARGE OR ITCHING   Items with * indicate a potential emergency and should be followed up as soon as possible or go to the Emergency Department if any problems should occur.  Please show the CHEMOTHERAPY ALERT CARD or IMMUNOTHERAPY ALERT CARD at check-in to the Emergency Department and triage nurse.  Should you have questions after your visit or need to cancel or reschedule your appointment, please contact [facility] [phone]  and follow the prompts.  Office hours are 8:00 a.m. to 4:30 p.m. Monday - Friday. Please note that voicemails left after 4:00 p.m. may not be returned until the following business day.  We are closed weekends and major holidays. You have access to a nurse at all times for urgent questions. Please call the main number to the clinic [phone] and follow the prompts.  For any non-urgent questions, you may also contact your provider using MyChart. We now offer e-Visits for anyone 46 and older to request care online for non-urgent symptoms. For details visit [email or web address].   Also download the MyChart app! Go to the app store, search "MyChart", open the app, select [place], and log in with your MyChart username and password.

## 2023-04-21 DIAGNOSIS — L603 Nail dystrophy: Secondary | ICD-10-CM | POA: Diagnosis not present

## 2023-04-21 DIAGNOSIS — E1151 Type 2 diabetes mellitus with diabetic peripheral angiopathy without gangrene: Secondary | ICD-10-CM | POA: Diagnosis not present

## 2023-04-22 ENCOUNTER — Inpatient Hospital Stay: Payer: Medicare PPO

## 2023-04-22 DIAGNOSIS — C9000 Multiple myeloma not having achieved remission: Secondary | ICD-10-CM

## 2023-04-22 DIAGNOSIS — Z5112 Encounter for antineoplastic immunotherapy: Secondary | ICD-10-CM | POA: Diagnosis not present

## 2023-04-22 LAB — COMPREHENSIVE METABOLIC PANEL
ALT: 37 U/L (ref 0–44)
AST: 47 U/L — ABNORMAL HIGH (ref 15–41)
Albumin: 3.2 g/dL — ABNORMAL LOW (ref 3.5–5.0)
Alkaline Phosphatase: 62 U/L (ref 38–126)
Anion gap: 8 (ref 5–15)
BUN: 17 mg/dL (ref 8–23)
CO2: 25 mmol/L (ref 22–32)
Calcium: 8.4 mg/dL — ABNORMAL LOW (ref 8.9–10.3)
Chloride: 107 mmol/L (ref 98–111)
Creatinine, Ser: 1.23 mg/dL — ABNORMAL HIGH (ref 0.44–1.00)
GFR, Estimated: 45 mL/min — ABNORMAL LOW (ref >60)
Glucose, Bld: 113 mg/dL — ABNORMAL HIGH (ref 70–99)
Potassium: 3.6 mmol/L (ref 3.5–5.1)
Sodium: 140 mmol/L (ref 135–145)
Total Bilirubin: 0.7 mg/dL (ref 0.3–1.2)
Total Protein: 6.3 g/dL — ABNORMAL LOW (ref 6.5–8.1)

## 2023-04-22 LAB — CBC WITH DIFFERENTIAL/PLATELET
Abs Immature Granulocytes: 0.01 10*3/uL (ref 0.00–0.07)
Basophils Absolute: 0 10*3/uL (ref 0.0–0.1)
Basophils Relative: 1 %
Eosinophils Absolute: 0.1 10*3/uL (ref 0.0–0.5)
Eosinophils Relative: 4 %
HCT: 33.4 % — ABNORMAL LOW (ref 36.0–46.0)
Hemoglobin: 10.8 g/dL — ABNORMAL LOW (ref 12.0–15.0)
Immature Granulocytes: 0 %
Lymphocytes Relative: 16 %
Lymphs Abs: 0.6 10*3/uL — ABNORMAL LOW (ref 0.7–4.0)
MCH: 32.4 pg (ref 26.0–34.0)
MCHC: 32.3 g/dL (ref 30.0–36.0)
MCV: 100.3 fL — ABNORMAL HIGH (ref 80.0–100.0)
Monocytes Absolute: 0.3 10*3/uL (ref 0.1–1.0)
Monocytes Relative: 9 %
Neutro Abs: 2.6 10*3/uL (ref 1.7–7.7)
Neutrophils Relative %: 70 %
Platelets: 91 10*3/uL — ABNORMAL LOW (ref 150–400)
RBC: 3.33 MIL/uL — ABNORMAL LOW (ref 3.87–5.11)
RDW: 15.5 % (ref 11.5–15.5)
WBC: 3.6 10*3/uL — ABNORMAL LOW (ref 4.0–10.5)
nRBC: 0 % (ref 0.0–0.2)

## 2023-04-22 LAB — MAGNESIUM: Magnesium: 2.3 mg/dL (ref 1.7–2.4)

## 2023-04-23 ENCOUNTER — Inpatient Hospital Stay: Payer: Medicare PPO

## 2023-04-23 VITALS — BP 139/73 | HR 70 | Temp 97.7°F | Resp 18 | Wt 117.5 lb

## 2023-04-23 DIAGNOSIS — Z5112 Encounter for antineoplastic immunotherapy: Secondary | ICD-10-CM | POA: Diagnosis not present

## 2023-04-23 DIAGNOSIS — C9000 Multiple myeloma not having achieved remission: Secondary | ICD-10-CM

## 2023-04-23 MED ORDER — DEXAMETHASONE 4 MG PO TABS
10.0000 mg | ORAL_TABLET | Freq: Once | ORAL | Status: AC
  Administered 2023-04-23: 10 mg via ORAL
  Filled 2023-04-23: qty 3

## 2023-04-23 MED ORDER — BORTEZOMIB CHEMO SQ INJECTION 3.5 MG (2.5MG/ML)
1.3000 mg/m2 | Freq: Once | INTRAMUSCULAR | Status: AC
  Administered 2023-04-23: 2 mg via SUBCUTANEOUS
  Filled 2023-04-23: qty 0.8

## 2023-04-23 NOTE — Progress Notes (Signed)
Patient presents today for Velcade injection. MAR reviewed and updated. Patient has no complaints of any side effects related to treatment. Vitals and labs within parameters for treatment.   Velcade injection given today per MD orders. Tolerated infusion without adverse affects. Vital signs stable. No complaints at this time. Discharged from clinic by wheel chair in stable condition. Alert and oriented x 3. F/U with [facility] as scheduled.

## 2023-04-23 NOTE — Patient Instructions (Signed)
[facility] [place]  Discharge Instructions: Thank you for choosing [facility] to provide your oncology and hematology care.  If you have a lab appointment with the Cancer Center - please note that after April 8th, 2024, all labs will be drawn in the cancer center.  You do not have to check in or register with the main entrance as you have in the past but will complete your check-in in the cancer center.  Wear comfortable clothing and clothing appropriate for easy access to any Portacath or PICC line.   We strive to give you quality time with your provider. You may need to reschedule your appointment if you arrive late (15 or more minutes).  Arriving late affects you and other patients whose appointments are after yours.  Also, if you miss three or more appointments without notifying the office, you may be dismissed from the clinic at the provider's discretion.      For prescription refill requests, have your pharmacy contact our office and allow 72 hours for refills to be completed.    Today you received the following chemotherapy and/or immunotherapy agents Velcade injection.  Bortezomib Injection What is this medication? BORTEZOMIB (bor TEZ oh mib) treats lymphoma. It may also be used to treat multiple myeloma, a type of bone marrow cancer. It works by blocking a protein that causes cancer cells to grow and multiply. This helps to slow or stop the spread of cancer cells. This medicine may be used for other purposes; ask your health care provider or pharmacist if you have questions. COMMON BRAND NAME(S): Velcade What should I tell my care team before I take this medication? They need to know if you have any of these conditions: Dehydration Diabetes Heart disease Liver disease Tingling of the fingers or toes or other nerve disorder An unusual or allergic reaction to bortezomib, other medications, foods, dyes, or preservatives If you or your partner are pregnant  or trying to get pregnant Breastfeeding How should I use this medication? This medication is injected into a vein or under the skin. It is given by your care team in a hospital or clinic setting. Talk to your care team about the use of this medication in children. Special care may be needed. Overdosage: If you think you have taken too much of this medicine contact a poison control center or emergency room at once. NOTE: This medicine is only for you. Do not share this medicine with others. What if I miss a dose? Keep appointments for follow-up doses. It is important not to miss your dose. Call your care team if you are unable to keep an appointment. What may interact with this medication? Ketoconazole Rifampin This list may not describe all possible interactions. Give your health care provider a list of all the medicines, herbs, non-prescription drugs, or dietary supplements you use. Also tell them if you smoke, drink alcohol, or use illegal drugs. Some items may interact with your medicine. What should I watch for while using this medication? Your condition will be monitored carefully while you are receiving this medication. You may need blood work while taking this medication. This medication may affect your coordination, reaction time, or judgment. Do not drive or operate machinery until you know how this medication affects you. Sit up or stand slowly to reduce the risk of dizzy or fainting spells. Drinking alcohol with this medication can increase the risk of these side effects. This medication may increase your risk of getting an   infection. Call your care team for advice if you get a fever, chills, sore throat, or other symptoms of a cold or flu. Do not treat yourself. Try to avoid being around people who are sick. Check with your care team if you have severe diarrhea, nausea, and vomiting, or if you sweat a lot. The loss of too much body fluid may make it dangerous for you to take this  medication. Talk to your care team if you may be pregnant. Serious birth defects can occur if you take this medication during pregnancy and for 7 months after the last dose. You will need a negative pregnancy test before starting this medication. Contraception is recommended while taking this medication and for 7 months after the last dose. Your care team can help you find the option that works for you. If your partner can get pregnant, use a condom during sex while taking this medication and for 4 months after the last dose. Do not breastfeed while taking this medication and for 2 months after the last dose. This medication may cause infertility. Talk to your care team if you are concerned about your fertility. What side effects may I notice from receiving this medication? Side effects that you should report to your care team as soon as possible: Allergic reactions--skin rash, itching, hives, swelling of the face, lips, tongue, or throat Bleeding--bloody or black, tar-like stools, vomiting blood or brown material that looks like coffee grounds, red or dark brown urine, small red or purple spots on skin, unusual bruising or bleeding Bleeding in the brain--severe headache, stiff neck, confusion, dizziness, change in vision, numbness or weakness of the face, arm, or leg, trouble speaking, trouble walking, vomiting Bowel blockage--stomach cramping, unable to have a bowel movement or pass gas, loss of appetite, vomiting Heart failure--shortness of breath, swelling of the ankles, feet, or hands, sudden weight gain, unusual weakness or fatigue Infection--fever, chills, cough, sore throat, wounds that don't heal, pain or trouble when passing urine, general feeling of discomfort or being unwell Liver injury--right upper belly pain, loss of appetite, nausea, light-colored stool, dark yellow or brown urine, yellowing skin or eyes, unusual weakness or fatigue Low blood pressure--dizziness, feeling faint or  lightheaded, blurry vision Lung injury--shortness of breath or trouble breathing, cough, spitting up blood, chest pain, fever Pain, tingling, or numbness in the hands or feet Severe or prolonged diarrhea Stomach pain, bloody diarrhea, pale skin, unusual weakness or fatigue, decrease in the amount of urine, which may be signs of hemolytic uremic syndrome Sudden and severe headache, confusion, change in vision, seizures, which may be signs of posterior reversible encephalopathy syndrome (PRES) TTP--purple spots on the skin or inside the mouth, pale skin, yellowing skin or eyes, unusual weakness or fatigue, fever, fast or irregular heartbeat, confusion, change in vision, trouble speaking, trouble walking Tumor lysis syndrome (TLS)--nausea, vomiting, diarrhea, decrease in the amount of urine, dark urine, unusual weakness or fatigue, confusion, muscle pain or cramps, fast or irregular heartbeat, joint pain Side effects that usually do not require medical attention (report to your care team if they continue or are bothersome): Constipation Diarrhea Fatigue Loss of appetite Nausea This list may not describe all possible side effects. Call your doctor for medical advice about side effects. You may report side effects to FDA at 1-800-FDA-1088. Where should I keep my medication? This medication is given in a hospital or clinic. It will not be stored at home. NOTE: This sheet is a summary. It may not cover all possible   information. If you have questions about this medicine, talk to your doctor, pharmacist, or health care provider.  2023 Elsevier/Gold Standard (2022-04-30 00:00:00)       To help prevent nausea and vomiting after your treatment, we encourage you to take your nausea medication as directed.  BELOW ARE SYMPTOMS THAT SHOULD BE REPORTED IMMEDIATELY: *FEVER GREATER THAN 100.4 F (38 C) OR HIGHER *CHILLS OR SWEATING *NAUSEA AND VOMITING THAT IS NOT CONTROLLED WITH YOUR NAUSEA  MEDICATION *UNUSUAL SHORTNESS OF BREATH *UNUSUAL BRUISING OR BLEEDING *URINARY PROBLEMS (pain or burning when urinating, or frequent urination) *BOWEL PROBLEMS (unusual diarrhea, constipation, pain near the anus) TENDERNESS IN MOUTH AND THROAT WITH OR WITHOUT PRESENCE OF ULCERS (sore throat, sores in mouth, or a toothache) UNUSUAL RASH, SWELLING OR PAIN  UNUSUAL VAGINAL DISCHARGE OR ITCHING   Items with * indicate a potential emergency and should be followed up as soon as possible or go to the Emergency Department if any problems should occur.  Please show the CHEMOTHERAPY ALERT CARD or IMMUNOTHERAPY ALERT CARD at check-in to the Emergency Department and triage nurse.  Should you have questions after your visit or need to cancel or reschedule your appointment, please contact [facility] [phone]  and follow the prompts.  Office hours are 8:00 a.m. to 4:30 p.m. Monday - Friday. Please note that voicemails left after 4:00 p.m. may not be returned until the following business day.  We are closed weekends and major holidays. You have access to a nurse at all times for urgent questions. Please call the main number to the clinic [phone] and follow the prompts.  For any non-urgent questions, you may also contact your provider using MyChart. We now offer e-Visits for anyone 18 and older to request care online for non-urgent symptoms. For details visit [email or web address].   Also download the MyChart app! Go to the app store, search "MyChart", open the app, select [facility], and log in with your MyChart username and password.   

## 2023-04-29 ENCOUNTER — Inpatient Hospital Stay: Payer: Medicare PPO

## 2023-04-29 DIAGNOSIS — C9000 Multiple myeloma not having achieved remission: Secondary | ICD-10-CM

## 2023-04-29 DIAGNOSIS — Z5112 Encounter for antineoplastic immunotherapy: Secondary | ICD-10-CM | POA: Diagnosis not present

## 2023-04-29 LAB — COMPREHENSIVE METABOLIC PANEL
ALT: 37 U/L (ref 0–44)
AST: 33 U/L (ref 15–41)
Albumin: 3 g/dL — ABNORMAL LOW (ref 3.5–5.0)
Alkaline Phosphatase: 56 U/L (ref 38–126)
Anion gap: 8 (ref 5–15)
BUN: 21 mg/dL (ref 8–23)
CO2: 24 mmol/L (ref 22–32)
Calcium: 8.5 mg/dL — ABNORMAL LOW (ref 8.9–10.3)
Chloride: 108 mmol/L (ref 98–111)
Creatinine, Ser: 1.16 mg/dL — ABNORMAL HIGH (ref 0.44–1.00)
GFR, Estimated: 48 mL/min — ABNORMAL LOW (ref >60)
Glucose, Bld: 127 mg/dL — ABNORMAL HIGH (ref 70–99)
Potassium: 3.8 mmol/L (ref 3.5–5.1)
Sodium: 140 mmol/L (ref 135–145)
Total Bilirubin: 0.7 mg/dL (ref 0.3–1.2)
Total Protein: 5.9 g/dL — ABNORMAL LOW (ref 6.5–8.1)

## 2023-04-29 LAB — CBC WITH DIFFERENTIAL/PLATELET
Abs Immature Granulocytes: 0 10*3/uL (ref 0.00–0.07)
Basophils Absolute: 0 10*3/uL (ref 0.0–0.1)
Basophils Relative: 1 %
Eosinophils Absolute: 0.1 10*3/uL (ref 0.0–0.5)
Eosinophils Relative: 3 %
HCT: 32.8 % — ABNORMAL LOW (ref 36.0–46.0)
Hemoglobin: 10.7 g/dL — ABNORMAL LOW (ref 12.0–15.0)
Immature Granulocytes: 0 %
Lymphocytes Relative: 14 %
Lymphs Abs: 0.6 10*3/uL — ABNORMAL LOW (ref 0.7–4.0)
MCH: 32.9 pg (ref 26.0–34.0)
MCHC: 32.6 g/dL (ref 30.0–36.0)
MCV: 100.9 fL — ABNORMAL HIGH (ref 80.0–100.0)
Monocytes Absolute: 0.3 10*3/uL (ref 0.1–1.0)
Monocytes Relative: 8 %
Neutro Abs: 3.1 10*3/uL (ref 1.7–7.7)
Neutrophils Relative %: 74 %
Platelets: 79 10*3/uL — ABNORMAL LOW (ref 150–400)
RBC: 3.25 MIL/uL — ABNORMAL LOW (ref 3.87–5.11)
RDW: 15.8 % — ABNORMAL HIGH (ref 11.5–15.5)
WBC: 4.1 10*3/uL (ref 4.0–10.5)
nRBC: 0 % (ref 0.0–0.2)

## 2023-04-29 LAB — MAGNESIUM: Magnesium: 2.2 mg/dL (ref 1.7–2.4)

## 2023-04-30 ENCOUNTER — Inpatient Hospital Stay: Payer: Medicare PPO

## 2023-04-30 VITALS — BP 169/93 | HR 92 | Temp 97.1°F | Resp 20

## 2023-04-30 DIAGNOSIS — Z5112 Encounter for antineoplastic immunotherapy: Secondary | ICD-10-CM | POA: Diagnosis not present

## 2023-04-30 DIAGNOSIS — C9000 Multiple myeloma not having achieved remission: Secondary | ICD-10-CM

## 2023-04-30 MED ORDER — DEXAMETHASONE 4 MG PO TABS
10.0000 mg | ORAL_TABLET | Freq: Once | ORAL | Status: AC
  Administered 2023-04-30: 10 mg via ORAL
  Filled 2023-04-30: qty 3

## 2023-04-30 MED ORDER — BORTEZOMIB CHEMO SQ INJECTION 3.5 MG (2.5MG/ML)
1.3000 mg/m2 | Freq: Once | INTRAMUSCULAR | Status: AC
  Administered 2023-04-30: 2 mg via SUBCUTANEOUS
  Filled 2023-04-30: qty 0.8

## 2023-04-30 NOTE — Patient Instructions (Signed)
[facility] [place]  Discharge Instructions: Thank you for choosing [facility] to provide your oncology and hematology care.  If you have a lab appointment with the Cancer Center - please note that after April 8th, 2024, all labs will be drawn in the cancer center.  You do not have to check in or register with the main entrance as you have in the past but will complete your check-in in the cancer center.  Wear comfortable clothing and clothing appropriate for easy access to any Portacath or PICC line.   We strive to give you quality time with your provider. You may need to reschedule your appointment if you arrive late (15 or more minutes).  Arriving late affects you and other patients whose appointments are after yours.  Also, if you miss three or more appointments without notifying the office, you may be dismissed from the clinic at the provider's discretion.      For prescription refill requests, have your pharmacy contact our office and allow 72 hours for refills to be completed.    Today you received the following chemotherapy and/or immunotherapy agents Velcade. Bortezomib Injection What is this medication? BORTEZOMIB (bor TEZ oh mib) treats lymphoma. It may also be used to treat multiple myeloma, a type of bone marrow cancer. It works by blocking a protein that causes cancer cells to grow and multiply. This helps to slow or stop the spread of cancer cells. This medicine may be used for other purposes; ask your health care provider or pharmacist if you have questions. COMMON BRAND NAME(S): Velcade What should I tell my care team before I take this medication? They need to know if you have any of these conditions: Dehydration Diabetes Heart disease Liver disease Tingling of the fingers or toes or other nerve disorder An unusual or allergic reaction to bortezomib, other medications, foods, dyes, or preservatives If you or your partner are pregnant or trying to  get pregnant Breastfeeding How should I use this medication? This medication is injected into a vein or under the skin. It is given by your care team in a hospital or clinic setting. Talk to your care team about the use of this medication in children. Special care may be needed. Overdosage: If you think you have taken too much of this medicine contact a poison control center or emergency room at once. NOTE: This medicine is only for you. Do not share this medicine with others. What if I miss a dose? Keep appointments for follow-up doses. It is important not to miss your dose. Call your care team if you are unable to keep an appointment. What may interact with this medication? Ketoconazole Rifampin This list may not describe all possible interactions. Give your health care provider a list of all the medicines, herbs, non-prescription drugs, or dietary supplements you use. Also tell them if you smoke, drink alcohol, or use illegal drugs. Some items may interact with your medicine. What should I watch for while using this medication? Your condition will be monitored carefully while you are receiving this medication. You may need blood work while taking this medication. This medication may affect your coordination, reaction time, or judgment. Do not drive or operate machinery until you know how this medication affects you. Sit up or stand slowly to reduce the risk of dizzy or fainting spells. Drinking alcohol with this medication can increase the risk of these side effects. This medication may increase your risk of getting an infection. Call   your care team for advice if you get a fever, chills, sore throat, or other symptoms of a cold or flu. Do not treat yourself. Try to avoid being around people who are sick. Check with your care team if you have severe diarrhea, nausea, and vomiting, or if you sweat a lot. The loss of too much body fluid may make it dangerous for you to take this medication. Talk to  your care team if you may be pregnant. Serious birth defects can occur if you take this medication during pregnancy and for 7 months after the last dose. You will need a negative pregnancy test before starting this medication. Contraception is recommended while taking this medication and for 7 months after the last dose. Your care team can help you find the option that works for you. If your partner can get pregnant, use a condom during sex while taking this medication and for 4 months after the last dose. Do not breastfeed while taking this medication and for 2 months after the last dose. This medication may cause infertility. Talk to your care team if you are concerned about your fertility. What side effects may I notice from receiving this medication? Side effects that you should report to your care team as soon as possible: Allergic reactions--skin rash, itching, hives, swelling of the face, lips, tongue, or throat Bleeding--bloody or black, tar-like stools, vomiting blood or brown material that looks like coffee grounds, red or dark brown urine, small red or purple spots on skin, unusual bruising or bleeding Bleeding in the brain--severe headache, stiff neck, confusion, dizziness, change in vision, numbness or weakness of the face, arm, or leg, trouble speaking, trouble walking, vomiting Bowel blockage--stomach cramping, unable to have a bowel movement or pass gas, loss of appetite, vomiting Heart failure--shortness of breath, swelling of the ankles, feet, or hands, sudden weight gain, unusual weakness or fatigue Infection--fever, chills, cough, sore throat, wounds that don't heal, pain or trouble when passing urine, general feeling of discomfort or being unwell Liver injury--right upper belly pain, loss of appetite, nausea, light-colored stool, dark yellow or brown urine, yellowing skin or eyes, unusual weakness or fatigue Low blood pressure--dizziness, feeling faint or lightheaded, blurry  vision Lung injury--shortness of breath or trouble breathing, cough, spitting up blood, chest pain, fever Pain, tingling, or numbness in the hands or feet Severe or prolonged diarrhea Stomach pain, bloody diarrhea, pale skin, unusual weakness or fatigue, decrease in the amount of urine, which may be signs of hemolytic uremic syndrome Sudden and severe headache, confusion, change in vision, seizures, which may be signs of posterior reversible encephalopathy syndrome (PRES) TTP--purple spots on the skin or inside the mouth, pale skin, yellowing skin or eyes, unusual weakness or fatigue, fever, fast or irregular heartbeat, confusion, change in vision, trouble speaking, trouble walking Tumor lysis syndrome (TLS)--nausea, vomiting, diarrhea, decrease in the amount of urine, dark urine, unusual weakness or fatigue, confusion, muscle pain or cramps, fast or irregular heartbeat, joint pain Side effects that usually do not require medical attention (report to your care team if they continue or are bothersome): Constipation Diarrhea Fatigue Loss of appetite Nausea This list may not describe all possible side effects. Call your doctor for medical advice about side effects. You may report side effects to FDA at 1-800-FDA-1088. Where should I keep my medication? This medication is given in a hospital or clinic. It will not be stored at home. NOTE: This sheet is a summary. It may not cover all possible information. If   you have questions about this medicine, talk to your doctor, pharmacist, or health care provider.  2023 Elsevier/Gold Standard (2022-04-30 00:00:00)       To help prevent nausea and vomiting after your treatment, we encourage you to take your nausea medication as directed.  BELOW ARE SYMPTOMS THAT SHOULD BE REPORTED IMMEDIATELY: *FEVER GREATER THAN 100.4 F (38 C) OR HIGHER *CHILLS OR SWEATING *NAUSEA AND VOMITING THAT IS NOT CONTROLLED WITH YOUR NAUSEA MEDICATION *UNUSUAL SHORTNESS OF  BREATH *UNUSUAL BRUISING OR BLEEDING *URINARY PROBLEMS (pain or burning when urinating, or frequent urination) *BOWEL PROBLEMS (unusual diarrhea, constipation, pain near the anus) TENDERNESS IN MOUTH AND THROAT WITH OR WITHOUT PRESENCE OF ULCERS (sore throat, sores in mouth, or a toothache) UNUSUAL RASH, SWELLING OR PAIN  UNUSUAL VAGINAL DISCHARGE OR ITCHING   Items with * indicate a potential emergency and should be followed up as soon as possible or go to the Emergency Department if any problems should occur.  Please show the CHEMOTHERAPY ALERT CARD or IMMUNOTHERAPY ALERT CARD at check-in to the Emergency Department and triage nurse.  Should you have questions after your visit or need to cancel or reschedule your appointment, please contact [facility] [phone]  and follow the prompts.  Office hours are 8:00 a.m. to 4:30 p.m. Monday - Friday. Please note that voicemails left after 4:00 p.m. may not be returned until the following business day.  We are closed weekends and major holidays. You have access to a nurse at all times for urgent questions. Please call the main number to the clinic [phone] and follow the prompts.  For any non-urgent questions, you may also contact your provider using MyChart. We now offer e-Visits for anyone 18 and older to request care online for non-urgent symptoms. For details visit [email or web address].   Also download the MyChart app! Go to the app store, search "MyChart", open the app, select [facility], and log in with your MyChart username and password.   

## 2023-04-30 NOTE — Progress Notes (Signed)
Patient presents today for chemotherapy Velcade injection.  Patient is in satisfactory condition with no new complaints voiced.  Vital signs are stable with HR of 104 upon arrivel, rechecked 92.  Labs reviewed and all labs are within treatment parameters.  We will proceed with treatment per MD orders.    Patient tolerated treatment well with no complaints voiced.  Patient left via wheelchair,with caregiver, in stable condition.  Vital signs stable at discharge.  Follow up as scheduled.

## 2023-05-01 ENCOUNTER — Non-Acute Institutional Stay (SKILLED_NURSING_FACILITY): Payer: Medicare PPO | Admitting: Internal Medicine

## 2023-05-01 ENCOUNTER — Encounter: Payer: Self-pay | Admitting: Internal Medicine

## 2023-05-01 DIAGNOSIS — F01518 Vascular dementia, unspecified severity, with other behavioral disturbance: Secondary | ICD-10-CM

## 2023-05-01 DIAGNOSIS — K8689 Other specified diseases of pancreas: Secondary | ICD-10-CM | POA: Diagnosis not present

## 2023-05-01 DIAGNOSIS — K529 Noninfective gastroenteritis and colitis, unspecified: Secondary | ICD-10-CM | POA: Diagnosis not present

## 2023-05-01 DIAGNOSIS — E039 Hypothyroidism, unspecified: Secondary | ICD-10-CM

## 2023-05-01 NOTE — Assessment & Plan Note (Signed)
Because of her dementia she is not able to comply with dietary recommendations.  The severity of her diarrhea depends on dietary content.

## 2023-05-01 NOTE — Progress Notes (Signed)
   NURSING HOME LOCATION:  [facility] ROOM NUMBER:    CODE STATUS:  DNR  PCP:  Synthia Innocent NP  This is a nursing facility follow up visit of chronic medical diagnoses & to document compliance with Regulation 483.30 (c) in The Long Term Care Survey Manual Phase 2 which mandates caregiver visit ( visits can alternate among physician, PA or NP as per statutes) within 10 days of 30 days / 60 days/ 90 days post admission to SNF date    Interim medical record and care since last SNF visit was updated with review of diagnostic studies and change in clinical status since last visit were documented.  HPI: She is a permanent resident of this facility with medical diagnoses of essential hypertension, history of CNS lymphoma, dementia, diabetes with CKD, hypothyroidism, multiple myeloma, pancreatic insufficiency, and dementia.  Labs are current as she is actively being treated for multiple myeloma.  On 5/15 creatinine was 1.16 with a GFR 48 indicating CKD stage IIIa.  The chronic anemia is relatively stable; current H/H is 10.7/32.8.  It is macrocytic with an MCV of 100.9.  Protein/caloric malnutrition is present with albumin of 3 and total protein of 5.9.  Review of systems: Dementia invalidated responses.  When I asked if she were having medical symptoms her reply was "I do not know."  Then as I began to go through the review of systems everything was totally positive. Staff reports that stools vary from loose to frankly watery depending on what she eats.  This is in the context of pancreatic insufficiency for which she is receiving Questran, Imodium A-D, and Creon.  She is noncompliant with any dietary recommendations.  For example today she was surreptitiously eating Skittles from a large glass jar in the activities room.  Residents were to guess the correct number of pieces of candy in the jar for a prize.  Physical exam:  Pertinent or positive findings: She was asleep at the time  of the exam but did wake up and would converse but confabulated.  She is markedly thin and appears malnourished.  She is missing several mandibular teeth.  She wears an upper plate.  Slight tachycardia is present.  Pedal pulses are strong.  Limbs are atrophic.  Interosseous wasting of the hands is present.  She has small circular hyperpigmentated lesions over the lower extremities.  General appearance:  no acute distress, increased work of breathing is present.   Lymphatic: No lymphadenopathy about the head, neck, axilla. Eyes: No conjunctival inflammation or lid edema is present. There is no scleral icterus. Ears:  External ear exam shows no significant lesions or deformities.   Nose:  External nasal examination shows no deformity or inflammation. Nasal mucosa are pink and moist without lesions, exudates Oral exam:  Lips and gums are healthy appearing. There is no oropharyngeal erythema or exudate. Neck:  No thyromegaly, masses, tenderness noted.    Heart:  No gallop, murmur, click, rub .  Lungs: Chest clear to auscultation without wheezes, rhonchi, rales, rubs. Abdomen: Bowel sounds are normal. Abdomen is soft and nontender with no organomegaly, hernias, masses. GU: Deferred  Extremities:  No cyanosis, clubbing, edema  Neurologic exam :Balance, Rhomberg, finger to nose testing could not be completed due to clinical state Skin: Warm & dry w/o tenting. No significant rash.  See summary under each active problem in the Problem List with associated updated therapeutic plan

## 2023-05-01 NOTE — Assessment & Plan Note (Signed)
She can provide no history and has difficulty following commands.  She is noncompliant with dietary recommendations.

## 2023-05-01 NOTE — Assessment & Plan Note (Signed)
Staff describes the stools as loose to frankly watery depending on diet content.  She is noncompliant with dietary recommendations due to her dementia.  The stool changes are in the context of pancreatic insufficiency for which she is on multiple medications at high dosage.

## 2023-05-01 NOTE — Assessment & Plan Note (Signed)
She does exhibit a slight tachycardia.  TSH was therapeutic last June; update next month is indicated.

## 2023-05-01 NOTE — Patient Instructions (Signed)
See assessment and plan under each diagnosis in the problem list and acutely for this visit 

## 2023-05-12 ENCOUNTER — Other Ambulatory Visit: Payer: Self-pay

## 2023-05-12 DIAGNOSIS — C9000 Multiple myeloma not having achieved remission: Secondary | ICD-10-CM

## 2023-05-13 ENCOUNTER — Inpatient Hospital Stay: Payer: Medicare PPO

## 2023-05-13 DIAGNOSIS — C9000 Multiple myeloma not having achieved remission: Secondary | ICD-10-CM

## 2023-05-13 DIAGNOSIS — Z5112 Encounter for antineoplastic immunotherapy: Secondary | ICD-10-CM | POA: Diagnosis not present

## 2023-05-13 LAB — COMPREHENSIVE METABOLIC PANEL
ALT: 23 U/L (ref 0–44)
AST: 30 U/L (ref 15–41)
Albumin: 3.3 g/dL — ABNORMAL LOW (ref 3.5–5.0)
Alkaline Phosphatase: 59 U/L (ref 38–126)
Anion gap: 12 (ref 5–15)
BUN: 19 mg/dL (ref 8–23)
CO2: 23 mmol/L (ref 22–32)
Calcium: 8.8 mg/dL — ABNORMAL LOW (ref 8.9–10.3)
Chloride: 104 mmol/L (ref 98–111)
Creatinine, Ser: 1.19 mg/dL — ABNORMAL HIGH (ref 0.44–1.00)
GFR, Estimated: 47 mL/min — ABNORMAL LOW (ref >60)
Glucose, Bld: 85 mg/dL (ref 70–99)
Potassium: 3.9 mmol/L (ref 3.5–5.1)
Sodium: 139 mmol/L (ref 135–145)
Total Bilirubin: 0.9 mg/dL (ref 0.3–1.2)
Total Protein: 6.4 g/dL — ABNORMAL LOW (ref 6.5–8.1)

## 2023-05-13 LAB — CBC WITH DIFFERENTIAL/PLATELET
Abs Immature Granulocytes: 0.01 10*3/uL (ref 0.00–0.07)
Basophils Absolute: 0 10*3/uL (ref 0.0–0.1)
Basophils Relative: 1 %
Eosinophils Absolute: 0.2 10*3/uL (ref 0.0–0.5)
Eosinophils Relative: 4 %
HCT: 33.5 % — ABNORMAL LOW (ref 36.0–46.0)
Hemoglobin: 10.9 g/dL — ABNORMAL LOW (ref 12.0–15.0)
Immature Granulocytes: 0 %
Lymphocytes Relative: 13 %
Lymphs Abs: 0.5 10*3/uL — ABNORMAL LOW (ref 0.7–4.0)
MCH: 33.1 pg (ref 26.0–34.0)
MCHC: 32.5 g/dL (ref 30.0–36.0)
MCV: 101.8 fL — ABNORMAL HIGH (ref 80.0–100.0)
Monocytes Absolute: 0.4 10*3/uL (ref 0.1–1.0)
Monocytes Relative: 10 %
Neutro Abs: 2.6 10*3/uL (ref 1.7–7.7)
Neutrophils Relative %: 72 %
Platelets: 114 10*3/uL — ABNORMAL LOW (ref 150–400)
RBC: 3.29 MIL/uL — ABNORMAL LOW (ref 3.87–5.11)
RDW: 15.8 % — ABNORMAL HIGH (ref 11.5–15.5)
WBC: 3.7 10*3/uL — ABNORMAL LOW (ref 4.0–10.5)
nRBC: 0 % (ref 0.0–0.2)

## 2023-05-13 LAB — MAGNESIUM: Magnesium: 2.3 mg/dL (ref 1.7–2.4)

## 2023-05-14 ENCOUNTER — Other Ambulatory Visit: Payer: Medicare PPO

## 2023-05-14 ENCOUNTER — Other Ambulatory Visit: Payer: Self-pay

## 2023-05-14 ENCOUNTER — Inpatient Hospital Stay: Payer: Medicare PPO

## 2023-05-14 VITALS — BP 164/99 | HR 96 | Temp 97.5°F | Resp 18 | Wt 123.4 lb

## 2023-05-14 DIAGNOSIS — C9000 Multiple myeloma not having achieved remission: Secondary | ICD-10-CM

## 2023-05-14 DIAGNOSIS — Z5112 Encounter for antineoplastic immunotherapy: Secondary | ICD-10-CM | POA: Diagnosis not present

## 2023-05-14 MED ORDER — DEXAMETHASONE 4 MG PO TABS
10.0000 mg | ORAL_TABLET | Freq: Once | ORAL | Status: AC
  Administered 2023-05-14: 10 mg via ORAL
  Filled 2023-05-14: qty 3

## 2023-05-14 MED ORDER — BORTEZOMIB CHEMO SQ INJECTION 3.5 MG (2.5MG/ML)
1.3000 mg/m2 | Freq: Once | INTRAMUSCULAR | Status: AC
  Administered 2023-05-14: 2 mg via SUBCUTANEOUS
  Filled 2023-05-14: qty 0.8

## 2023-05-14 NOTE — Progress Notes (Signed)
Patient presents today for chemotherapy infusion.  Patient is in satisfactory condition with no new complaints voiced.  Vital signs are stable.  Labs reviewed and all labs are within treatment parameters.  We will proceed with treatment per MD orders.    Patient tolerated Velcade injection with no complaints voiced.  Site clean and dry with no bruising or swelling noted.  No complaints of pain.  Discharged via wheelchair with NT with vital signs stable and no signs or symptoms of distress noted.

## 2023-05-14 NOTE — Patient Instructions (Signed)
[facility]  Discharge Instructions: Thank you for choosing [facility] to provide your oncology and hematology care.  If you have a lab appointment with the Cancer Center - please note that after April 8th, 2024, all labs will be drawn in the cancer center.  You do not have to check in or register with the main entrance as you have in the past but will complete your check-in in the cancer center.  Wear comfortable clothing and clothing appropriate for easy access to any Portacath or PICC line.   We strive to give you quality time with your provider. You may need to reschedule your appointment if you arrive late (15 or more minutes).  Arriving late affects you and other patients whose appointments are after yours.  Also, if you miss three or more appointments without notifying the office, you may be dismissed from the clinic at the provider's discretion.      For prescription refill requests, have your pharmacy contact our office and allow 72 hours for refills to be completed.    Today you received the following chemotherapy and/or immunotherapy agents Velcade. Bortezomib Injection What is this medication? BORTEZOMIB (bor TEZ oh mib) treats lymphoma. It may also be used to treat multiple myeloma, a type of bone marrow cancer. It works by blocking a protein that causes cancer cells to grow and multiply. This helps to slow or stop the spread of cancer cells. This medicine may be used for other purposes; ask your health care provider or pharmacist if you have questions. COMMON BRAND NAME(S): Velcade What should I tell my care team before I take this medication? They need to know if you have any of these conditions: Dehydration Diabetes Heart disease Liver disease Tingling of the fingers or toes or other nerve disorder An unusual or allergic reaction to bortezomib, other medications, foods, dyes, or preservatives If you or your partner are pregnant or trying to  get pregnant Breastfeeding How should I use this medication? This medication is injected into a vein or under the skin. It is given by your care team in a hospital or clinic setting. Talk to your care team about the use of this medication in children. Special care may be needed. Overdosage: If you think you have taken too much of this medicine contact a poison control center or emergency room at once. NOTE: This medicine is only for you. Do not share this medicine with others. What if I miss a dose? Keep appointments for follow-up doses. It is important not to miss your dose. Call your care team if you are unable to keep an appointment. What may interact with this medication? Ketoconazole Rifampin This list may not describe all possible interactions. Give your health care provider a list of all the medicines, herbs, non-prescription drugs, or dietary supplements you use. Also tell them if you smoke, drink alcohol, or use illegal drugs. Some items may interact with your medicine. What should I watch for while using this medication? Your condition will be monitored carefully while you are receiving this medication. You may need blood work while taking this medication. This medication may affect your coordination, reaction time, or judgment. Do not drive or operate machinery until you know how this medication affects you. Sit up or stand slowly to reduce the risk of dizzy or fainting spells. Drinking alcohol with this medication can increase the risk of these side effects. This medication may increase your risk of getting an infection. Call  your care team for advice if you get a fever, chills, sore throat, or other symptoms of a cold or flu. Do not treat yourself. Try to avoid being around people who are sick. Check with your care team if you have severe diarrhea, nausea, and vomiting, or if you sweat a lot. The loss of too much body fluid may make it dangerous for you to take this medication. Talk to  your care team if you may be pregnant. Serious birth defects can occur if you take this medication during pregnancy and for 7 months after the last dose. You will need a negative pregnancy test before starting this medication. Contraception is recommended while taking this medication and for 7 months after the last dose. Your care team can help you find the option that works for you. If your partner can get pregnant, use a condom during sex while taking this medication and for 4 months after the last dose. Do not breastfeed while taking this medication and for 2 months after the last dose. This medication may cause infertility. Talk to your care team if you are concerned about your fertility. What side effects may I notice from receiving this medication? Side effects that you should report to your care team as soon as possible: Allergic reactions--skin rash, itching, hives, swelling of the face, lips, tongue, or throat Bleeding--bloody or black, tar-like stools, vomiting blood or Amanda Wu material that looks like coffee grounds, red or dark Amanda Wu urine, small red or purple spots on skin, unusual bruising or bleeding Bleeding in the brain--severe headache, stiff neck, confusion, dizziness, change in vision, numbness or weakness of the face, arm, or leg, trouble speaking, trouble walking, vomiting Bowel blockage--stomach cramping, unable to have a bowel movement or pass gas, loss of appetite, vomiting Heart failure--shortness of breath, swelling of the ankles, feet, or hands, sudden weight gain, unusual weakness or fatigue Infection--fever, chills, cough, sore throat, wounds that don't heal, pain or trouble when passing urine, general feeling of discomfort or being unwell Liver injury--right upper belly pain, loss of appetite, nausea, light-colored stool, dark yellow or Amanda Wu urine, yellowing skin or eyes, unusual weakness or fatigue Low blood pressure--dizziness, feeling faint or lightheaded, blurry  vision Lung injury--shortness of breath or trouble breathing, cough, spitting up blood, chest pain, fever Pain, tingling, or numbness in the hands or feet Severe or prolonged diarrhea Stomach pain, bloody diarrhea, pale skin, unusual weakness or fatigue, decrease in the amount of urine, which may be signs of hemolytic uremic syndrome Sudden and severe headache, confusion, change in vision, seizures, which may be signs of posterior reversible encephalopathy syndrome (PRES) TTP--purple spots on the skin or inside the mouth, pale skin, yellowing skin or eyes, unusual weakness or fatigue, fever, fast or irregular heartbeat, confusion, change in vision, trouble speaking, trouble walking Tumor lysis syndrome (TLS)--nausea, vomiting, diarrhea, decrease in the amount of urine, dark urine, unusual weakness or fatigue, confusion, muscle pain or cramps, fast or irregular heartbeat, joint pain Side effects that usually do not require medical attention (report to your care team if they continue or are bothersome): Constipation Diarrhea Fatigue Loss of appetite Nausea This list may not describe all possible side effects. Call your doctor for medical advice about side effects. You may report side effects to FDA at 1-800-FDA-1088. Where should I keep my medication? This medication is given in a hospital or clinic. It will not be stored at home. NOTE: This sheet is a summary. It may not cover all possible information. If  you have questions about this medicine, talk to your doctor, pharmacist, or health care provider.  2024 Elsevier/Gold Standard (2022-05-06 00:00:00)        To help prevent nausea and vomiting after your treatment, we encourage you to take your nausea medication as directed.  BELOW ARE SYMPTOMS THAT SHOULD BE REPORTED IMMEDIATELY: *FEVER GREATER THAN 100.4 F (38 C) OR HIGHER *CHILLS OR SWEATING *NAUSEA AND VOMITING THAT IS NOT CONTROLLED WITH YOUR NAUSEA MEDICATION *UNUSUAL SHORTNESS OF  BREATH *UNUSUAL BRUISING OR BLEEDING *URINARY PROBLEMS (pain or burning when urinating, or frequent urination) *BOWEL PROBLEMS (unusual diarrhea, constipation, pain near the anus) TENDERNESS IN MOUTH AND THROAT WITH OR WITHOUT PRESENCE OF ULCERS (sore throat, sores in mouth, or a toothache) UNUSUAL RASH, SWELLING OR PAIN  UNUSUAL VAGINAL DISCHARGE OR ITCHING   Items with * indicate a potential emergency and should be followed up as soon as possible or go to the Emergency Department if any problems should occur.  Please show the CHEMOTHERAPY ALERT CARD or IMMUNOTHERAPY ALERT CARD at check-in to the Emergency Department and triage nurse.  Should you have questions after your visit or need to cancel or reschedule your appointment, please contact [facility] [phone]  and follow the prompts.  Office hours are 8:00 a.m. to 4:30 p.m. Monday - Friday. Please note that voicemails left after 4:00 p.m. may not be returned until the following business day.  We are closed weekends and major holidays. You have access to a nurse at all times for urgent questions. Please call the main number to the clinic [phone] and follow the prompts.  For any non-urgent questions, you may also contact your provider using MyChart. We now offer e-Visits for anyone 75 and older to request care online for non-urgent symptoms. For details visit [email or web address].   Also download the MyChart app! Go to the app store, search "MyChart", open the app, select [place], and log in with your MyChart username and password.

## 2023-05-20 ENCOUNTER — Inpatient Hospital Stay: Payer: Medicare PPO | Attending: Hematology

## 2023-05-20 ENCOUNTER — Inpatient Hospital Stay: Payer: Medicare PPO

## 2023-05-20 DIAGNOSIS — Z5112 Encounter for antineoplastic immunotherapy: Secondary | ICD-10-CM | POA: Insufficient documentation

## 2023-05-20 DIAGNOSIS — C9000 Multiple myeloma not having achieved remission: Secondary | ICD-10-CM | POA: Diagnosis not present

## 2023-05-20 LAB — COMPREHENSIVE METABOLIC PANEL
ALT: 53 U/L — ABNORMAL HIGH (ref 0–44)
AST: 45 U/L — ABNORMAL HIGH (ref 15–41)
Albumin: 3.5 g/dL (ref 3.5–5.0)
Alkaline Phosphatase: 71 U/L (ref 38–126)
Anion gap: 10 (ref 5–15)
BUN: 23 mg/dL (ref 8–23)
CO2: 21 mmol/L — ABNORMAL LOW (ref 22–32)
Calcium: 8.7 mg/dL — ABNORMAL LOW (ref 8.9–10.3)
Chloride: 106 mmol/L (ref 98–111)
Creatinine, Ser: 1.22 mg/dL — ABNORMAL HIGH (ref 0.44–1.00)
GFR, Estimated: 45 mL/min — ABNORMAL LOW (ref >60)
Glucose, Bld: 102 mg/dL — ABNORMAL HIGH (ref 70–99)
Potassium: 4 mmol/L (ref 3.5–5.1)
Sodium: 137 mmol/L (ref 135–145)
Total Bilirubin: 0.6 mg/dL (ref 0.3–1.2)
Total Protein: 7 g/dL (ref 6.5–8.1)

## 2023-05-20 LAB — CBC WITH DIFFERENTIAL/PLATELET
Abs Immature Granulocytes: 0.01 10*3/uL (ref 0.00–0.07)
Basophils Absolute: 0 10*3/uL (ref 0.0–0.1)
Basophils Relative: 1 %
Eosinophils Absolute: 0.1 10*3/uL (ref 0.0–0.5)
Eosinophils Relative: 3 %
HCT: 36.5 % (ref 36.0–46.0)
Hemoglobin: 11.9 g/dL — ABNORMAL LOW (ref 12.0–15.0)
Immature Granulocytes: 0 %
Lymphocytes Relative: 19 %
Lymphs Abs: 0.7 10*3/uL (ref 0.7–4.0)
MCH: 32.4 pg (ref 26.0–34.0)
MCHC: 32.6 g/dL (ref 30.0–36.0)
MCV: 99.5 fL (ref 80.0–100.0)
Monocytes Absolute: 0.5 10*3/uL (ref 0.1–1.0)
Monocytes Relative: 12 %
Neutro Abs: 2.6 10*3/uL (ref 1.7–7.7)
Neutrophils Relative %: 65 %
Platelets: 86 10*3/uL — ABNORMAL LOW (ref 150–400)
RBC: 3.67 MIL/uL — ABNORMAL LOW (ref 3.87–5.11)
RDW: 15.7 % — ABNORMAL HIGH (ref 11.5–15.5)
WBC: 4 10*3/uL (ref 4.0–10.5)
nRBC: 0 % (ref 0.0–0.2)

## 2023-05-20 LAB — MAGNESIUM: Magnesium: 2.5 mg/dL — ABNORMAL HIGH (ref 1.7–2.4)

## 2023-05-21 ENCOUNTER — Inpatient Hospital Stay: Payer: Medicare PPO

## 2023-05-21 VITALS — BP 154/84 | HR 76 | Temp 96.7°F | Resp 18 | Wt 116.6 lb

## 2023-05-21 DIAGNOSIS — Z5112 Encounter for antineoplastic immunotherapy: Secondary | ICD-10-CM | POA: Diagnosis not present

## 2023-05-21 DIAGNOSIS — C9000 Multiple myeloma not having achieved remission: Secondary | ICD-10-CM | POA: Diagnosis not present

## 2023-05-21 MED ORDER — DEXAMETHASONE 4 MG PO TABS
10.0000 mg | ORAL_TABLET | Freq: Once | ORAL | Status: AC
  Administered 2023-05-21: 10 mg via ORAL
  Filled 2023-05-21: qty 3

## 2023-05-21 MED ORDER — BORTEZOMIB CHEMO SQ INJECTION 3.5 MG (2.5MG/ML)
1.3000 mg/m2 | Freq: Once | INTRAMUSCULAR | Status: AC
  Administered 2023-05-21: 2 mg via SUBCUTANEOUS
  Filled 2023-05-21: qty 0.8

## 2023-05-21 NOTE — Patient Instructions (Signed)
[facility] [place]  Discharge Instructions: Thank you for choosing [facility] to provide your oncology and hematology care.  If you have a lab appointment with the Cancer Center - please note that after April 8th, 2024, all labs will be drawn in the cancer center.  You do not have to check in or register with the main entrance as you have in the past but will complete your check-in in the cancer center.  Wear comfortable clothing and clothing appropriate for easy access to any Portacath or PICC line.   We strive to give you quality time with your provider. You may need to reschedule your appointment if you arrive late (15 or more minutes).  Arriving late affects you and other patients whose appointments are after yours.  Also, if you miss three or more appointments without notifying the office, you may be dismissed from the clinic at the provider's discretion.      For prescription refill requests, have your pharmacy contact our office and allow 72 hours for refills to be completed.  To help prevent nausea and vomiting after your treatment, we encourage you to take your nausea medication as directed.  BELOW ARE SYMPTOMS THAT SHOULD BE REPORTED IMMEDIATELY: *FEVER GREATER THAN 100.4 F (38 C) OR HIGHER *CHILLS OR SWEATING *NAUSEA AND VOMITING THAT IS NOT CONTROLLED WITH YOUR NAUSEA MEDICATION *UNUSUAL SHORTNESS OF BREATH *UNUSUAL BRUISING OR BLEEDING *URINARY PROBLEMS (pain or burning when urinating, or frequent urination) *BOWEL PROBLEMS (unusual diarrhea, constipation, pain near the anus) TENDERNESS IN MOUTH AND THROAT WITH OR WITHOUT PRESENCE OF ULCERS (sore throat, sores in mouth, or a toothache) UNUSUAL RASH, SWELLING OR PAIN  UNUSUAL VAGINAL DISCHARGE OR ITCHING   Items with * indicate a potential emergency and should be followed up as soon as possible or go to the Emergency Department if any problems should occur.  Please show the CHEMOTHERAPY ALERT CARD or  IMMUNOTHERAPY ALERT CARD at check-in to the Emergency Department and triage nurse.  Should you have questions after your visit or need to cancel or reschedule your appointment, please contact [facility] [phone]  and follow the prompts.  Office hours are 8:00 a.m. to 4:30 p.m. Monday - Friday. Please note that voicemails left after 4:00 p.m. may not be returned until the following business day.  We are closed weekends and major holidays. You have access to a nurse at all times for urgent questions. Please call the main number to the clinic [phone] and follow the prompts.  For any non-urgent questions, you may also contact your provider using MyChart. We now offer e-Visits for anyone 18 and older to request care online for non-urgent symptoms. For details visit [email or web address].   Also download the MyChart app! Go to the app store, search "MyChart", open the app, select [place], and log in with your MyChart username and password.   

## 2023-05-21 NOTE — Progress Notes (Signed)
Patient tolerated Velcade injection with no complaints voiced.  Lab work reviewed.  See MAR for details.  Injection site clean and dry with no bruising or swelling noted.  Patient stable during and after injection.  Band aid applied.  VSS.  Patient left in satisfactory condition with no s/s of distress noted.   

## 2023-05-27 ENCOUNTER — Inpatient Hospital Stay: Payer: Medicare PPO

## 2023-05-27 DIAGNOSIS — Z5112 Encounter for antineoplastic immunotherapy: Secondary | ICD-10-CM | POA: Diagnosis not present

## 2023-05-27 DIAGNOSIS — C9000 Multiple myeloma not having achieved remission: Secondary | ICD-10-CM | POA: Diagnosis not present

## 2023-05-27 LAB — CBC WITH DIFFERENTIAL/PLATELET
Abs Immature Granulocytes: 0.01 10*3/uL (ref 0.00–0.07)
Basophils Absolute: 0 10*3/uL (ref 0.0–0.1)
Basophils Relative: 0 %
Eosinophils Absolute: 0.1 10*3/uL (ref 0.0–0.5)
Eosinophils Relative: 3 %
HCT: 36.8 % (ref 36.0–46.0)
Hemoglobin: 12.3 g/dL (ref 12.0–15.0)
Immature Granulocytes: 0 %
Lymphocytes Relative: 14 %
Lymphs Abs: 0.7 10*3/uL (ref 0.7–4.0)
MCH: 33 pg (ref 26.0–34.0)
MCHC: 33.4 g/dL (ref 30.0–36.0)
MCV: 98.7 fL (ref 80.0–100.0)
Monocytes Absolute: 0.4 10*3/uL (ref 0.1–1.0)
Monocytes Relative: 8 %
Neutro Abs: 3.5 10*3/uL (ref 1.7–7.7)
Neutrophils Relative %: 75 %
Platelets: 70 10*3/uL — ABNORMAL LOW (ref 150–400)
RBC: 3.73 MIL/uL — ABNORMAL LOW (ref 3.87–5.11)
RDW: 15.5 % (ref 11.5–15.5)
WBC: 4.6 10*3/uL (ref 4.0–10.5)
nRBC: 0 % (ref 0.0–0.2)

## 2023-05-27 LAB — COMPREHENSIVE METABOLIC PANEL
ALT: 27 U/L (ref 0–44)
AST: 31 U/L (ref 15–41)
Albumin: 3.4 g/dL — ABNORMAL LOW (ref 3.5–5.0)
Alkaline Phosphatase: 60 U/L (ref 38–126)
Anion gap: 10 (ref 5–15)
BUN: 22 mg/dL (ref 8–23)
CO2: 22 mmol/L (ref 22–32)
Calcium: 8.6 mg/dL — ABNORMAL LOW (ref 8.9–10.3)
Chloride: 107 mmol/L (ref 98–111)
Creatinine, Ser: 1.16 mg/dL — ABNORMAL HIGH (ref 0.44–1.00)
GFR, Estimated: 48 mL/min — ABNORMAL LOW (ref >60)
Glucose, Bld: 142 mg/dL — ABNORMAL HIGH (ref 70–99)
Potassium: 3.7 mmol/L (ref 3.5–5.1)
Sodium: 139 mmol/L (ref 135–145)
Total Bilirubin: 0.6 mg/dL (ref 0.3–1.2)
Total Protein: 6.8 g/dL (ref 6.5–8.1)

## 2023-05-27 LAB — MAGNESIUM: Magnesium: 2.2 mg/dL (ref 1.7–2.4)

## 2023-05-28 ENCOUNTER — Encounter: Payer: Self-pay | Admitting: Adult Health

## 2023-05-28 ENCOUNTER — Inpatient Hospital Stay: Payer: Medicare PPO

## 2023-05-28 ENCOUNTER — Non-Acute Institutional Stay (SKILLED_NURSING_FACILITY): Payer: Medicare PPO | Admitting: Adult Health

## 2023-05-28 VITALS — BP 152/70 | HR 80 | Temp 96.7°F | Resp 18 | Wt 116.9 lb

## 2023-05-28 DIAGNOSIS — I700 Atherosclerosis of aorta: Secondary | ICD-10-CM | POA: Diagnosis not present

## 2023-05-28 DIAGNOSIS — Z5112 Encounter for antineoplastic immunotherapy: Secondary | ICD-10-CM | POA: Diagnosis not present

## 2023-05-28 DIAGNOSIS — I440 Atrioventricular block, first degree: Secondary | ICD-10-CM

## 2023-05-28 DIAGNOSIS — R001 Bradycardia, unspecified: Secondary | ICD-10-CM

## 2023-05-28 DIAGNOSIS — Z66 Do not resuscitate: Secondary | ICD-10-CM | POA: Diagnosis not present

## 2023-05-28 DIAGNOSIS — C9000 Multiple myeloma not having achieved remission: Secondary | ICD-10-CM

## 2023-05-28 MED ORDER — DEXAMETHASONE 4 MG PO TABS
10.0000 mg | ORAL_TABLET | Freq: Once | ORAL | Status: AC
  Administered 2023-05-28: 10 mg via ORAL
  Filled 2023-05-28: qty 3

## 2023-05-28 MED ORDER — BORTEZOMIB CHEMO SQ INJECTION 3.5 MG (2.5MG/ML)
1.3000 mg/m2 | Freq: Once | INTRAMUSCULAR | Status: AC
  Administered 2023-05-28: 2 mg via SUBCUTANEOUS
  Filled 2023-05-28: qty 0.8

## 2023-05-28 NOTE — Progress Notes (Signed)
Patient presents today for Velcade injection.  Patient is in satisfactory condition with no new complaints voiced.  Vital signs are stable.  Labs reviewed and all labs are within treatment parameters.  We will proceed with treatment per MD orders.    Patient tolerated injection well with no complaints voiced.  Patient left via wheelchair with [facility] staff in stable condition.  Vital signs stable at discharge.  Follow up as scheduled.

## 2023-05-28 NOTE — Progress Notes (Signed)
Location:  [facility] Room Number: 148 W Place of Service:  SNF (31)   CODE STATUS: DNR  Allergies  Allergen Reactions   Lotensin [Benazepril]     Chief Complaint  Patient presents with   Medical Management of Chronic Issues                Multiple myeloma without achieving remission:   Aortic atherosclerosis  First degree av block/bradycardia:     HPI:  She is a 77 year old long term resident of this facility being seen for the management of her chronic illnesses: Multiple myeloma without achieving remission:   Aortic atherosclerosis  First degree av block/bradycardia. There are reports of anxiety or depressive thoughts   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HCC)    Dementia (HCC)    Diabetes mellitus with CKD    H/O partial nephrectomy    Hypokalemia    Hypothyroidism    Impaired cognition    Multiple myeloma (HCC)    Dr Kayleen Memos, [facility]    Past Surgical History:  Procedure Laterality Date   ABDOMINAL HYSTERECTOMY     CRANIOTOMY     for lymphoma   FLEXIBLE SIGMOIDOSCOPY N/A 04/03/2023   Procedure: FLEXIBLE SIGMOIDOSCOPY;  Surgeon: Dolores Frame, MD;  Location: [facility];  Service: Gastroenterology;  Laterality: N/A;   YAG LASER APPLICATION Right 07/02/2015   Procedure: YAG LASER APPLICATION;  Surgeon: Susa Simmonds, MD;  Location: [facility] ORS;  Service: Ophthalmology;  Laterality: Right;    Social History   Socioeconomic History   Marital status: Single    Spouse name: Not on file   Number of children: Not on file   Years of education: Not on file   Highest education level: Not on file  Occupational History   Occupation: retired   Tobacco Use   Smoking status: Never   Smokeless tobacco: Never  Vaping Use   Vaping Use: Never used  Substance and Sexual Activity   Alcohol use: No   Drug use: No   Sexual activity: Never  Other Topics Concern   Not  on file  Social History Narrative   Long term resident of [facility]    Social Determinants of Health   Financial Resource Strain: Low Risk  (10/16/2020)   Overall Financial Resource Strain (CARDIA)    Difficulty of Paying Living Expenses: Not very hard  Food Insecurity: No Food Insecurity (04/02/2023)   Hunger Vital Sign    Worried About Running Out of Food in the Last Year: Never true    Ran Out of Food in the Last Year: Never true  Transportation Needs: No Transportation Needs (04/02/2023)   PRAPARE - Administrator, Civil Service (Medical): No    Lack of Transportation (Non-Medical): No  Physical Activity: Inactive (10/16/2020)   Exercise Vital Sign    Days of Exercise per Week: 0 days    Minutes of Exercise per Session: 0 min  Stress: No Stress Concern Present (10/16/2020)   [employer] of Occupational Health - Occupational Stress Questionnaire    Feeling of Stress : Not at all  Social Connections: Moderately Isolated (10/16/2020)   Social Connection and Isolation Panel [NHANES]    Frequency of Communication with Friends and Family: More than three times a week    Frequency of Social Gatherings with Friends and Family: Once a week    Attends  Religious Services: More than 4 times per year    Active Member of Clubs or Organizations: No    Attends Banker Meetings: Never    Marital Status: Never married  Intimate Partner Violence: Not At Risk (04/02/2023)   Humiliation, Afraid, Rape, and Kick questionnaire    Fear of Current or Ex-Partner: No    Emotionally Abused: No    Physically Abused: No    Sexually Abused: No   Family History  Problem Relation Age of Onset   Depression Mother    Diabetes Mother    Heart disease Father    Cancer - Prostate Brother    Cancer Brother    Cancer Sister       VITAL SIGNS BP (!) 144/77   Pulse 75   Ht 5\' 9"  (1.753 m)   Wt 120 lb (54.4 kg)   BMI 17.72 kg/m   Outpatient Encounter Medications as of 05/28/2023   Medication Sig   acyclovir (ZOVIRAX) 400 MG tablet TAKE 1 TABLET BY MOUTH TWICE DAILY   aspirin EC 81 MG tablet Take 81 mg by mouth daily.   busPIRone (BUSPAR) 10 MG tablet Take 20 mg by mouth 3 (three) times daily. For anxiety   Calcium Carbonate-Vit D-Min (CALCIUM 600+D PLUS MINERALS) 600-400 MG-UNIT CHEW Chew 1 tablet by mouth daily.   cholestyramine (QUESTRAN) 4 g packet Take 4 g by mouth 2 (two) times daily between meals.   LACTOBACILLUS PROBIOTIC PO Take 2 capsules by mouth daily.   levothyroxine (SYNTHROID, LEVOTHROID) 25 MCG tablet Take 25 mcg by mouth daily before breakfast.   lipase/protease/amylase (CREON) 36000 UNITS CPEP capsule 36,000-114,000- 180,000 unit; amt: 1; oral Special Instructions: Take one capsule with snacks if eaten between meals. Twice A Day Between Meals as needed   lipase/protease/amylase (CREON) 36000 UNITS CPEP capsule Take 3 capsules by mouth 3 (three) times daily with meals.   loperamide (IMODIUM A-D) 2 MG tablet Take 2 mg by mouth 3 (three) times daily. For diarrhea   losartan (COZAAR) 50 MG tablet Take 1 tablet (50 mg total) by mouth daily.   NON FORMULARY Dysphagia 2 diet with thin liquids   NON FORMULARY Wanderguard #2237 to ankle for safety awareness. Check placement and function qshift. Special Instructions: Check placement and function qshift. Every Shift Day, Evening, Night   potassium chloride SA (KLOR-CON) 20 MEQ tablet Take 20 mEq by mouth daily.       SIGNIFICANT DIAGNOSTIC EXAMS  PREVIOUS   08-10-20: t score: -3.758  03-15-23: right hand x-ray:  On lateral view: minimal linear lucency in base of mid phalanx 3rd digit compatible with acute nondisplaced hairline fracture versus vascular channel artifact.  Moderate soft tissue swelling about PIP joint 3rd digit Mild osteopenia Mild degree of osteoarthritis   04-03-23: flex sig; bleeding from internal hemorrhoids; self limited  NO NEW EXAMS       LABS REVIEWED PREVIOUS    05-15-22:  hgb a1c 5.4 05-20-22: wbc 4.5; hgb 11.6; hct 35.6; mcv 97.8 plt 124 06-09-22: wbc 4.7; hgb 10.3; hct 32.0; mcv 97.6 plt 164; glucose 94; bun 23; creat 1.11; k+ 3.8; na++ 141; ca 9.1; gfr 51 protein 6.2 albumin 3.2 tah 3.485 06-25-22: wbc 4.2; hgb 10.7; hct 33.8; mcv 98.3 plt 110; glucose 97; bun 21; creat 1.10; k+ 3.6; na++ 140; ca 8.9; gfr 52; protein 6.4; albumin 3.3 08-06-22: wbc 4.4; hgb 11.0; hct 34.5; mcv 99.4 plt 130; glucose 138; bun 24; creat 1.38; k+ 4.1; na++ 141; ca 8.9;  gfr 39; protein 6.5; albumin 3.2; mag 2.3 09-09-22: wbc 5.1; hgb 11.3; hct 34.5; mcv 99.4 plt 132; glucose 119; bun 22; creat 1.29; k+ 4.1; na++ 143 ca 8.8; gfr 42; protein 6.6 albumin 3.2; mag 2.2   10-09-22: wbc 4.3; hgb 11.3; hct 35.0; mcv 101.7 plt 93; glucose 147; bun 21; creat 1.15; k+ 3.6; na++ 146; ca 9.0; gfr 49; protein 6.8; albumin 3.3  12-24-22: wbc 4.9; hgb 11.8; hct 36.1; mcv 101.4 plt 154; glucose 95; bun 21; creat 1.35; k+ 4.1; na++ 141; ca 9.1; gfr 40; mag 2.4; protein 6.7; albumin 3.5  01-19-23: hgb A1c; 5.6; micro/creat ratio: 6  01-21-23: wbc 4.5 hgb 11.7; hct 35.5; mcv 101.7 plt 132; glucose 110; bun 28;creat 1.59; k+ 4.1; na++ 140; ca 8.8; gfr 33; protein 6.7 albumin 3.4 mag 2.5 02-25-23; wbc 3.1; hgb 11.4; hct 34.5; mcv 101.5 plt 88; glucose 116; bun 24; creat 1.52; k+ 4.1; na++ 143; ca 8.7; gfr 35; protein 6.5; albumin 3.2 mag 2.3  TODAY  04-03-23: wbc 4.1; hgb 10.7; hct 32.1; mcv 97.3 plt 66; glucose 73; bun 18; creat 1.00; k+ 3.7; na++ 140; ca 8.2 gfr 58 #2: wbc 4.1; hgb 11.4; hct 34.0; mcv 98.3 plt 69  05-27-23: wbc 4.6; hgb 12.3; hct 36.8; mcv 98.7 plt 70; glucose 142; bun 22; creat 1.16; k+ 3.7; na++ 139; ca 8.6; gfr 48; protein 6.8 albumin 3.4; mag 2.2    Review of Systems  Constitutional:  Negative for malaise/fatigue.  Respiratory:  Negative for cough and shortness of breath.   Cardiovascular:  Negative for chest pain, palpitations and leg swelling.  Gastrointestinal:  Negative for abdominal pain,  constipation and heartburn.  Musculoskeletal:  Negative for back pain, joint pain and myalgias.  Skin: Negative.   Neurological:  Negative for dizziness.  Psychiatric/Behavioral:  The patient is not nervous/anxious.    Physical Exam Constitutional:      General: She is not in acute distress.    Appearance: She is underweight. She is not diaphoretic.  Neck:     Thyroid: No thyromegaly.  Cardiovascular:     Rate and Rhythm: Normal rate and regular rhythm.     Pulses: Normal pulses.     Heart sounds: Normal heart sounds.  Pulmonary:     Effort: Pulmonary effort is normal. No respiratory distress.     Breath sounds: Normal breath sounds.  Abdominal:     General: Bowel sounds are normal. There is no distension.     Palpations: Abdomen is soft.     Tenderness: There is no abdominal tenderness.  Musculoskeletal:        General: Normal range of motion.     Cervical back: Neck supple.     Right lower leg: No edema.     Left lower leg: No edema.  Lymphadenopathy:     Cervical: No cervical adenopathy.  Skin:    General: Skin is warm and dry.  Neurological:     Mental Status: She is alert. Mental status is at baseline.  Psychiatric:        Mood and Affect: Mood normal.      ASSESSMENT/ PLAN:  TODAY  Multiple myeloma without achieving remission: is followed by oncology  2. Aortic atherosclerosis (ct 08-25-14) is on statin and asa  3. First degree av block/bradycardia: has been seen by cardiology: no further interventions    PREVIOUS    4. Pancreatic insufficiency: will continue questran 8 gm twice daily; creon 180,000 units with meals; and imodium  2 mg three times daily   5. Macrocytic anemia: hgb 11.4; vitamin B 12: 620; is off iron due to diarrhea  6. Vascular dementia with behavioral disturbance: weight is 121; pounds will continue buspar 20 mg three times daily for anxiety; is off aricept  7. Herpes: no recent outbreak; is on acyclovir 400 mg twice daily   8. Failure  to thrive in adult: weight is stable at 120 pounds.   9. Hypokalemia: k+ 3.7 will continue k+ 20 meq daily   10. Hypothyroidism unspecified type: tsh 3.485 is on synthroid 25 mcg daily   11. Hypertension associated with stage 3b chronic kidney disease due to type 2 diabetes mellitus: b/p 144/77 will continue cozaar 50 mg daily asa 81 mg daily   12. Gastrointestinal hemorrhage associated with anorectal source: is status post flex sig: internal hemorrhoids; no stigmata of bleeding present. Hgb stable.   13. Diabetes mellitus type 2 with stage 3 chronic kidney disease and hypertension: hgb A1c 5.4 is on arb and statin  14. Post menopausal osteoporosis: t score -3.758; will continue supplements  15. Thrombocytopenia: plt 70 will monitor       Amanda Innocent NP [facility]   call [phone]

## 2023-05-29 ENCOUNTER — Encounter (HOSPITAL_COMMUNITY): Payer: Self-pay | Admitting: Gastroenterology

## 2023-06-03 ENCOUNTER — Other Ambulatory Visit: Payer: Self-pay

## 2023-06-03 DIAGNOSIS — C9000 Multiple myeloma not having achieved remission: Secondary | ICD-10-CM

## 2023-06-04 ENCOUNTER — Inpatient Hospital Stay: Payer: Medicare PPO

## 2023-06-05 ENCOUNTER — Inpatient Hospital Stay: Payer: Medicare PPO

## 2023-06-05 DIAGNOSIS — C9000 Multiple myeloma not having achieved remission: Secondary | ICD-10-CM | POA: Diagnosis not present

## 2023-06-05 DIAGNOSIS — Z5112 Encounter for antineoplastic immunotherapy: Secondary | ICD-10-CM | POA: Diagnosis not present

## 2023-06-05 LAB — COMPREHENSIVE METABOLIC PANEL
ALT: 36 U/L (ref 0–44)
AST: 41 U/L (ref 15–41)
Albumin: 3.2 g/dL — ABNORMAL LOW (ref 3.5–5.0)
Alkaline Phosphatase: 63 U/L (ref 38–126)
Anion gap: 6 (ref 5–15)
BUN: 18 mg/dL (ref 8–23)
CO2: 27 mmol/L (ref 22–32)
Calcium: 8.8 mg/dL — ABNORMAL LOW (ref 8.9–10.3)
Chloride: 107 mmol/L (ref 98–111)
Creatinine, Ser: 1.11 mg/dL — ABNORMAL HIGH (ref 0.44–1.00)
GFR, Estimated: 51 mL/min — ABNORMAL LOW (ref >60)
Glucose, Bld: 89 mg/dL (ref 70–99)
Potassium: 3.8 mmol/L (ref 3.5–5.1)
Sodium: 140 mmol/L (ref 135–145)
Total Bilirubin: 0.8 mg/dL (ref 0.3–1.2)
Total Protein: 6.4 g/dL — ABNORMAL LOW (ref 6.5–8.1)

## 2023-06-05 LAB — CBC WITH DIFFERENTIAL/PLATELET
Abs Immature Granulocytes: 0.01 10*3/uL (ref 0.00–0.07)
Basophils Absolute: 0 10*3/uL (ref 0.0–0.1)
Basophils Relative: 1 %
Eosinophils Absolute: 0.1 10*3/uL (ref 0.0–0.5)
Eosinophils Relative: 3 %
HCT: 34.8 % — ABNORMAL LOW (ref 36.0–46.0)
Hemoglobin: 11.5 g/dL — ABNORMAL LOW (ref 12.0–15.0)
Immature Granulocytes: 0 %
Lymphocytes Relative: 13 %
Lymphs Abs: 0.6 10*3/uL — ABNORMAL LOW (ref 0.7–4.0)
MCH: 32.9 pg (ref 26.0–34.0)
MCHC: 33 g/dL (ref 30.0–36.0)
MCV: 99.4 fL (ref 80.0–100.0)
Monocytes Absolute: 0.5 10*3/uL (ref 0.1–1.0)
Monocytes Relative: 11 %
Neutro Abs: 3.2 10*3/uL (ref 1.7–7.7)
Neutrophils Relative %: 72 %
Platelets: 93 10*3/uL — ABNORMAL LOW (ref 150–400)
RBC: 3.5 MIL/uL — ABNORMAL LOW (ref 3.87–5.11)
RDW: 15.4 % (ref 11.5–15.5)
WBC: 4.4 10*3/uL (ref 4.0–10.5)
nRBC: 0 % (ref 0.0–0.2)

## 2023-06-05 LAB — MAGNESIUM: Magnesium: 2.3 mg/dL (ref 1.7–2.4)

## 2023-06-09 ENCOUNTER — Other Ambulatory Visit: Payer: Self-pay

## 2023-06-11 ENCOUNTER — Inpatient Hospital Stay: Payer: Medicare PPO | Admitting: Hematology

## 2023-06-11 ENCOUNTER — Inpatient Hospital Stay: Payer: Medicare PPO

## 2023-06-11 VITALS — BP 150/87 | HR 77 | Temp 97.7°F | Resp 18 | Wt 121.4 lb

## 2023-06-11 DIAGNOSIS — C9000 Multiple myeloma not having achieved remission: Secondary | ICD-10-CM | POA: Diagnosis not present

## 2023-06-11 DIAGNOSIS — Z5112 Encounter for antineoplastic immunotherapy: Secondary | ICD-10-CM | POA: Diagnosis not present

## 2023-06-11 LAB — COMPREHENSIVE METABOLIC PANEL
ALT: 31 U/L (ref 0–44)
AST: 44 U/L — ABNORMAL HIGH (ref 15–41)
Albumin: 3.5 g/dL (ref 3.5–5.0)
Alkaline Phosphatase: 65 U/L (ref 38–126)
Anion gap: 8 (ref 5–15)
BUN: 26 mg/dL — ABNORMAL HIGH (ref 8–23)
CO2: 23 mmol/L (ref 22–32)
Calcium: 8.8 mg/dL — ABNORMAL LOW (ref 8.9–10.3)
Chloride: 108 mmol/L (ref 98–111)
Creatinine, Ser: 1.17 mg/dL — ABNORMAL HIGH (ref 0.44–1.00)
GFR, Estimated: 48 mL/min — ABNORMAL LOW (ref >60)
Glucose, Bld: 98 mg/dL (ref 70–99)
Potassium: 3.8 mmol/L (ref 3.5–5.1)
Sodium: 139 mmol/L (ref 135–145)
Total Bilirubin: 0.5 mg/dL (ref 0.3–1.2)
Total Protein: 6.8 g/dL (ref 6.5–8.1)

## 2023-06-11 LAB — CBC WITH DIFFERENTIAL/PLATELET
Abs Immature Granulocytes: 0.01 10*3/uL (ref 0.00–0.07)
Basophils Absolute: 0 10*3/uL (ref 0.0–0.1)
Basophils Relative: 1 %
Eosinophils Absolute: 0.1 10*3/uL (ref 0.0–0.5)
Eosinophils Relative: 3 %
HCT: 33.2 % — ABNORMAL LOW (ref 36.0–46.0)
Hemoglobin: 10.9 g/dL — ABNORMAL LOW (ref 12.0–15.0)
Immature Granulocytes: 0 %
Lymphocytes Relative: 12 %
Lymphs Abs: 0.5 10*3/uL — ABNORMAL LOW (ref 0.7–4.0)
MCH: 32.8 pg (ref 26.0–34.0)
MCHC: 32.8 g/dL (ref 30.0–36.0)
MCV: 100 fL (ref 80.0–100.0)
Monocytes Absolute: 0.5 10*3/uL (ref 0.1–1.0)
Monocytes Relative: 11 %
Neutro Abs: 3.1 10*3/uL (ref 1.7–7.7)
Neutrophils Relative %: 73 %
Platelets: 119 10*3/uL — ABNORMAL LOW (ref 150–400)
RBC: 3.32 MIL/uL — ABNORMAL LOW (ref 3.87–5.11)
RDW: 15.2 % (ref 11.5–15.5)
WBC: 4.2 10*3/uL (ref 4.0–10.5)
nRBC: 0 % (ref 0.0–0.2)

## 2023-06-11 LAB — MAGNESIUM: Magnesium: 2.4 mg/dL (ref 1.7–2.4)

## 2023-06-11 MED ORDER — DEXAMETHASONE 4 MG PO TABS
10.0000 mg | ORAL_TABLET | Freq: Once | ORAL | Status: AC
  Administered 2023-06-11: 10 mg via ORAL
  Filled 2023-06-11: qty 3

## 2023-06-11 MED ORDER — BORTEZOMIB CHEMO SQ INJECTION 3.5 MG (2.5MG/ML)
1.3000 mg/m2 | Freq: Once | INTRAMUSCULAR | Status: AC
  Administered 2023-06-11: 2 mg via SUBCUTANEOUS
  Filled 2023-06-11: qty 0.8

## 2023-06-11 NOTE — Patient Instructions (Signed)
[facility]  Discharge Instructions: Thank you for choosing [facility] to provide your oncology and hematology care.  If you have a lab appointment with the Cancer Center - please note that after April 8th, 2024, all labs will be drawn in the cancer center.  You do not have to check in or register with the main entrance as you have in the past but will complete your check-in in the cancer center.  Wear comfortable clothing and clothing appropriate for easy access to any Portacath or PICC line.   We strive to give you quality time with your provider. You may need to reschedule your appointment if you arrive late (15 or more minutes).  Arriving late affects you and other patients whose appointments are after yours.  Also, if you miss three or more appointments without notifying the office, you may be dismissed from the clinic at the provider's discretion.      For prescription refill requests, have your pharmacy contact our office and allow 72 hours for refills to be completed.    Today you received the following chemotherapy and/or immunotherapy agents Velcade. Bortezomib Injection What is this medication? BORTEZOMIB (bor TEZ oh mib) treats lymphoma. It may also be used to treat multiple myeloma, a type of bone marrow cancer. It works by blocking a protein that causes cancer cells to grow and multiply. This helps to slow or stop the spread of cancer cells. This medicine may be used for other purposes; ask your health care provider or pharmacist if you have questions. COMMON BRAND NAME(S): Velcade What should I tell my care team before I take this medication? They need to know if you have any of these conditions: Dehydration Diabetes Heart disease Liver disease Tingling of the fingers or toes or other nerve disorder An unusual or allergic reaction to bortezomib, other medications, foods, dyes, or preservatives If you or your partner are pregnant or trying to  get pregnant Breastfeeding How should I use this medication? This medication is injected into a vein or under the skin. It is given by your care team in a hospital or clinic setting. Talk to your care team about the use of this medication in children. Special care may be needed. Overdosage: If you think you have taken too much of this medicine contact a poison control center or emergency room at once. NOTE: This medicine is only for you. Do not share this medicine with others. What if I miss a dose? Keep appointments for follow-up doses. It is important not to miss your dose. Call your care team if you are unable to keep an appointment. What may interact with this medication? Ketoconazole Rifampin This list may not describe all possible interactions. Give your health care provider a list of all the medicines, herbs, non-prescription drugs, or dietary supplements you use. Also tell them if you smoke, drink alcohol, or use illegal drugs. Some items may interact with your medicine. What should I watch for while using this medication? Your condition will be monitored carefully while you are receiving this medication. You may need blood work while taking this medication. This medication may affect your coordination, reaction time, or judgment. Do not drive or operate machinery until you know how this medication affects you. Sit up or stand slowly to reduce the risk of dizzy or fainting spells. Drinking alcohol with this medication can increase the risk of these side effects. This medication may increase your risk of getting an infection. Call   your care team for advice if you get a fever, chills, sore throat, or other symptoms of a cold or flu. Do not treat yourself. Try to avoid being around people who are sick. Check with your care team if you have severe diarrhea, nausea, and vomiting, or if you sweat a lot. The loss of too much body fluid may make it dangerous for you to take this medication. Talk to  your care team if you may be pregnant. Serious birth defects can occur if you take this medication during pregnancy and for 7 months after the last dose. You will need a negative pregnancy test before starting this medication. Contraception is recommended while taking this medication and for 7 months after the last dose. Your care team can help you find the option that works for you. If your partner can get pregnant, use a condom during sex while taking this medication and for 4 months after the last dose. Do not breastfeed while taking this medication and for 2 months after the last dose. This medication may cause infertility. Talk to your care team if you are concerned about your fertility. What side effects may I notice from receiving this medication? Side effects that you should report to your care team as soon as possible: Allergic reactions--skin rash, itching, hives, swelling of the face, lips, tongue, or throat Bleeding--bloody or black, tar-like stools, vomiting blood or brown material that looks like coffee grounds, red or dark brown urine, small red or purple spots on skin, unusual bruising or bleeding Bleeding in the brain--severe headache, stiff neck, confusion, dizziness, change in vision, numbness or weakness of the face, arm, or leg, trouble speaking, trouble walking, vomiting Bowel blockage--stomach cramping, unable to have a bowel movement or pass gas, loss of appetite, vomiting Heart failure--shortness of breath, swelling of the ankles, feet, or hands, sudden weight gain, unusual weakness or fatigue Infection--fever, chills, cough, sore throat, wounds that don't heal, pain or trouble when passing urine, general feeling of discomfort or being unwell Liver injury--right upper belly pain, loss of appetite, nausea, light-colored stool, dark yellow or brown urine, yellowing skin or eyes, unusual weakness or fatigue Low blood pressure--dizziness, feeling faint or lightheaded, blurry  vision Lung injury--shortness of breath or trouble breathing, cough, spitting up blood, chest pain, fever Pain, tingling, or numbness in the hands or feet Severe or prolonged diarrhea Stomach pain, bloody diarrhea, pale skin, unusual weakness or fatigue, decrease in the amount of urine, which may be signs of hemolytic uremic syndrome Sudden and severe headache, confusion, change in vision, seizures, which may be signs of posterior reversible encephalopathy syndrome (PRES) TTP--purple spots on the skin or inside the mouth, pale skin, yellowing skin or eyes, unusual weakness or fatigue, fever, fast or irregular heartbeat, confusion, change in vision, trouble speaking, trouble walking Tumor lysis syndrome (TLS)--nausea, vomiting, diarrhea, decrease in the amount of urine, dark urine, unusual weakness or fatigue, confusion, muscle pain or cramps, fast or irregular heartbeat, joint pain Side effects that usually do not require medical attention (report to your care team if they continue or are bothersome): Constipation Diarrhea Fatigue Loss of appetite Nausea This list may not describe all possible side effects. Call your doctor for medical advice about side effects. You may report side effects to FDA at 1-800-FDA-1088. Where should I keep my medication? This medication is given in a hospital or clinic. It will not be stored at home. NOTE: This sheet is a summary. It may not cover all possible information. If   you have questions about this medicine, talk to your doctor, pharmacist, or health care provider.  2024 Elsevier/Gold Standard (2022-05-06 00:00:00)        To help prevent nausea and vomiting after your treatment, we encourage you to take your nausea medication as directed.  BELOW ARE SYMPTOMS THAT SHOULD BE REPORTED IMMEDIATELY: *FEVER GREATER THAN 100.4 F (38 C) OR HIGHER *CHILLS OR SWEATING *NAUSEA AND VOMITING THAT IS NOT CONTROLLED WITH YOUR NAUSEA MEDICATION *UNUSUAL SHORTNESS OF  BREATH *UNUSUAL BRUISING OR BLEEDING *URINARY PROBLEMS (pain or burning when urinating, or frequent urination) *BOWEL PROBLEMS (unusual diarrhea, constipation, pain near the anus) TENDERNESS IN MOUTH AND THROAT WITH OR WITHOUT PRESENCE OF ULCERS (sore throat, sores in mouth, or a toothache) UNUSUAL RASH, SWELLING OR PAIN  UNUSUAL VAGINAL DISCHARGE OR ITCHING   Items with * indicate a potential emergency and should be followed up as soon as possible or go to the Emergency Department if any problems should occur.  Please show the CHEMOTHERAPY ALERT CARD or IMMUNOTHERAPY ALERT CARD at check-in to the Emergency Department and triage nurse.  Should you have questions after your visit or need to cancel or reschedule your appointment, please contact [facility] [phone]  and follow the prompts.  Office hours are 8:00 a.m. to 4:30 p.m. Monday - Friday. Please note that voicemails left after 4:00 p.m. may not be returned until the following business day.  We are closed weekends and major holidays. You have access to a nurse at all times for urgent questions. Please call the main number to the clinic [phone] and follow the prompts.  For any non-urgent questions, you may also contact your provider using MyChart. We now offer e-Visits for anyone 18 and older to request care online for non-urgent symptoms. For details visit [email or web address].   Also download the MyChart app! Go to the app store, search "MyChart", open the app, select [facility], and log in with your MyChart username and password.   

## 2023-06-11 NOTE — Progress Notes (Signed)
Patient presents today for Velcade injection. Vital signs and labs within parameters for treatment. Patient has no complaints of any changes since her last treatment.   Treatment given today per MD orders. Tolerated without adverse affects. Vital signs stable. No complaints at this time. Discharged from clinic by wheel chair accompanied by CNA in stable condition. Alert and oriented x 3. F/U with [facility] as scheduled.

## 2023-06-12 LAB — KAPPA/LAMBDA LIGHT CHAINS
Kappa free light chain: 20.6 mg/L — ABNORMAL HIGH (ref 3.3–19.4)
Kappa, lambda light chain ratio: 0.67 (ref 0.26–1.65)
Lambda free light chains: 30.9 mg/L — ABNORMAL HIGH (ref 5.7–26.3)

## 2023-06-15 ENCOUNTER — Other Ambulatory Visit: Payer: Self-pay

## 2023-06-15 DIAGNOSIS — C9000 Multiple myeloma not having achieved remission: Secondary | ICD-10-CM

## 2023-06-15 DIAGNOSIS — D509 Iron deficiency anemia, unspecified: Secondary | ICD-10-CM

## 2023-06-15 LAB — PROTEIN ELECTROPHORESIS, SERUM
A/G Ratio: 1.2 (ref 0.7–1.7)
Albumin ELP: 3.3 g/dL (ref 2.9–4.4)
Alpha-1-Globulin: 0.2 g/dL (ref 0.0–0.4)
Alpha-2-Globulin: 0.6 g/dL (ref 0.4–1.0)
Beta Globulin: 0.7 g/dL (ref 0.7–1.3)
Gamma Globulin: 1.2 g/dL (ref 0.4–1.8)
Globulin, Total: 2.7 g/dL (ref 2.2–3.9)
M-Spike, %: 0.5 g/dL — ABNORMAL HIGH
Total Protein ELP: 6 g/dL (ref 6.0–8.5)

## 2023-06-17 ENCOUNTER — Inpatient Hospital Stay: Payer: Medicare PPO | Attending: Hematology

## 2023-06-17 DIAGNOSIS — Z5112 Encounter for antineoplastic immunotherapy: Secondary | ICD-10-CM | POA: Insufficient documentation

## 2023-06-17 DIAGNOSIS — Z79899 Other long term (current) drug therapy: Secondary | ICD-10-CM | POA: Diagnosis not present

## 2023-06-17 DIAGNOSIS — D509 Iron deficiency anemia, unspecified: Secondary | ICD-10-CM

## 2023-06-17 DIAGNOSIS — C9000 Multiple myeloma not having achieved remission: Secondary | ICD-10-CM | POA: Diagnosis not present

## 2023-06-17 LAB — COMPREHENSIVE METABOLIC PANEL
ALT: 27 U/L (ref 0–44)
AST: 34 U/L (ref 15–41)
Albumin: 3.2 g/dL — ABNORMAL LOW (ref 3.5–5.0)
Alkaline Phosphatase: 60 U/L (ref 38–126)
Anion gap: 8 (ref 5–15)
BUN: 20 mg/dL (ref 8–23)
CO2: 25 mmol/L (ref 22–32)
Calcium: 8.5 mg/dL — ABNORMAL LOW (ref 8.9–10.3)
Chloride: 105 mmol/L (ref 98–111)
Creatinine, Ser: 1.18 mg/dL — ABNORMAL HIGH (ref 0.44–1.00)
GFR, Estimated: 47 mL/min — ABNORMAL LOW (ref >60)
Glucose, Bld: 99 mg/dL (ref 70–99)
Potassium: 3.7 mmol/L (ref 3.5–5.1)
Sodium: 138 mmol/L (ref 135–145)
Total Bilirubin: 0.5 mg/dL (ref 0.3–1.2)
Total Protein: 6.7 g/dL (ref 6.5–8.1)

## 2023-06-17 LAB — CBC WITH DIFFERENTIAL/PLATELET
Abs Immature Granulocytes: 0.01 10*3/uL (ref 0.00–0.07)
Basophils Absolute: 0 10*3/uL (ref 0.0–0.1)
Basophils Relative: 0 %
Eosinophils Absolute: 0.1 10*3/uL (ref 0.0–0.5)
Eosinophils Relative: 3 %
HCT: 34.6 % — ABNORMAL LOW (ref 36.0–46.0)
Hemoglobin: 11.5 g/dL — ABNORMAL LOW (ref 12.0–15.0)
Immature Granulocytes: 0 %
Lymphocytes Relative: 13 %
Lymphs Abs: 0.5 10*3/uL — ABNORMAL LOW (ref 0.7–4.0)
MCH: 32.5 pg (ref 26.0–34.0)
MCHC: 33.2 g/dL (ref 30.0–36.0)
MCV: 97.7 fL (ref 80.0–100.0)
Monocytes Absolute: 0.5 10*3/uL (ref 0.1–1.0)
Monocytes Relative: 11 %
Neutro Abs: 2.9 10*3/uL (ref 1.7–7.7)
Neutrophils Relative %: 73 %
Platelets: 102 10*3/uL — ABNORMAL LOW (ref 150–400)
RBC: 3.54 MIL/uL — ABNORMAL LOW (ref 3.87–5.11)
RDW: 15.4 % (ref 11.5–15.5)
WBC: 4 10*3/uL (ref 4.0–10.5)
nRBC: 0 % (ref 0.0–0.2)

## 2023-06-17 LAB — MAGNESIUM: Magnesium: 2.3 mg/dL (ref 1.7–2.4)

## 2023-06-19 ENCOUNTER — Inpatient Hospital Stay: Payer: Medicare PPO

## 2023-06-19 VITALS — BP 140/81 | HR 68 | Temp 96.6°F | Resp 18 | Wt 117.9 lb

## 2023-06-19 DIAGNOSIS — C9000 Multiple myeloma not having achieved remission: Secondary | ICD-10-CM

## 2023-06-19 DIAGNOSIS — Z5112 Encounter for antineoplastic immunotherapy: Secondary | ICD-10-CM | POA: Diagnosis not present

## 2023-06-19 DIAGNOSIS — Z79899 Other long term (current) drug therapy: Secondary | ICD-10-CM | POA: Diagnosis not present

## 2023-06-19 MED ORDER — DEXAMETHASONE 4 MG PO TABS
10.0000 mg | ORAL_TABLET | Freq: Once | ORAL | Status: AC
  Administered 2023-06-19: 10 mg via ORAL
  Filled 2023-06-19: qty 3

## 2023-06-19 MED ORDER — BORTEZOMIB CHEMO SQ INJECTION 3.5 MG (2.5MG/ML)
1.3000 mg/m2 | Freq: Once | INTRAMUSCULAR | Status: AC
  Administered 2023-06-19: 2 mg via SUBCUTANEOUS
  Filled 2023-06-19: qty 0.8

## 2023-06-19 NOTE — Patient Instructions (Signed)
[facility]  Discharge Instructions: Thank you for choosing [facility] to provide your oncology and hematology care.  If you have a lab appointment with the Cancer Center - please note that after April 8th, 2024, all labs will be drawn in the cancer center.  You do not have to check in or register with the main entrance as you have in the past but will complete your check-in in the cancer center.  Wear comfortable clothing and clothing appropriate for easy access to any Portacath or PICC line.   We strive to give you quality time with your provider. You may need to reschedule your appointment if you arrive late (15 or more minutes).  Arriving late affects you and other patients whose appointments are after yours.  Also, if you miss three or more appointments without notifying the office, you may be dismissed from the clinic at the provider's discretion.      For prescription refill requests, have your pharmacy contact our office and allow 72 hours for refills to be completed.    Today you received the following chemotherapy and/or immunotherapy agents Velcade. Bortezomib Injection What is this medication? BORTEZOMIB (bor TEZ oh mib) treats lymphoma. It may also be used to treat multiple myeloma, a type of bone marrow cancer. It works by blocking a protein that causes cancer cells to grow and multiply. This helps to slow or stop the spread of cancer cells. This medicine may be used for other purposes; ask your health care provider or pharmacist if you have questions. COMMON BRAND NAME(S): Velcade What should I tell my care team before I take this medication? They need to know if you have any of these conditions: Dehydration Diabetes Heart disease Liver disease Tingling of the fingers or toes or other nerve disorder An unusual or allergic reaction to bortezomib, other medications, foods, dyes, or preservatives If you or your partner are pregnant or trying to  get pregnant Breastfeeding How should I use this medication? This medication is injected into a vein or under the skin. It is given by your care team in a hospital or clinic setting. Talk to your care team about the use of this medication in children. Special care may be needed. Overdosage: If you think you have taken too much of this medicine contact a poison control center or emergency room at once. NOTE: This medicine is only for you. Do not share this medicine with others. What if I miss a dose? Keep appointments for follow-up doses. It is important not to miss your dose. Call your care team if you are unable to keep an appointment. What may interact with this medication? Ketoconazole Rifampin This list may not describe all possible interactions. Give your health care provider a list of all the medicines, herbs, non-prescription drugs, or dietary supplements you use. Also tell them if you smoke, drink alcohol, or use illegal drugs. Some items may interact with your medicine. What should I watch for while using this medication? Your condition will be monitored carefully while you are receiving this medication. You may need blood work while taking this medication. This medication may affect your coordination, reaction time, or judgment. Do not drive or operate machinery until you know how this medication affects you. Sit up or stand slowly to reduce the risk of dizzy or fainting spells. Drinking alcohol with this medication can increase the risk of these side effects. This medication may increase your risk of getting an infection. Call   your care team for advice if you get a fever, chills, sore throat, or other symptoms of a cold or flu. Do not treat yourself. Try to avoid being around people who are sick. Check with your care team if you have severe diarrhea, nausea, and vomiting, or if you sweat a lot. The loss of too much body fluid may make it dangerous for you to take this medication. Talk to  your care team if you may be pregnant. Serious birth defects can occur if you take this medication during pregnancy and for 7 months after the last dose. You will need a negative pregnancy test before starting this medication. Contraception is recommended while taking this medication and for 7 months after the last dose. Your care team can help you find the option that works for you. If your partner can get pregnant, use a condom during sex while taking this medication and for 4 months after the last dose. Do not breastfeed while taking this medication and for 2 months after the last dose. This medication may cause infertility. Talk to your care team if you are concerned about your fertility. What side effects may I notice from receiving this medication? Side effects that you should report to your care team as soon as possible: Allergic reactions--skin rash, itching, hives, swelling of the face, lips, tongue, or throat Bleeding--bloody or black, tar-like stools, vomiting blood or brown material that looks like coffee grounds, red or dark brown urine, small red or purple spots on skin, unusual bruising or bleeding Bleeding in the brain--severe headache, stiff neck, confusion, dizziness, change in vision, numbness or weakness of the face, arm, or leg, trouble speaking, trouble walking, vomiting Bowel blockage--stomach cramping, unable to have a bowel movement or pass gas, loss of appetite, vomiting Heart failure--shortness of breath, swelling of the ankles, feet, or hands, sudden weight gain, unusual weakness or fatigue Infection--fever, chills, cough, sore throat, wounds that don't heal, pain or trouble when passing urine, general feeling of discomfort or being unwell Liver injury--right upper belly pain, loss of appetite, nausea, light-colored stool, dark yellow or brown urine, yellowing skin or eyes, unusual weakness or fatigue Low blood pressure--dizziness, feeling faint or lightheaded, blurry  vision Lung injury--shortness of breath or trouble breathing, cough, spitting up blood, chest pain, fever Pain, tingling, or numbness in the hands or feet Severe or prolonged diarrhea Stomach pain, bloody diarrhea, pale skin, unusual weakness or fatigue, decrease in the amount of urine, which may be signs of hemolytic uremic syndrome Sudden and severe headache, confusion, change in vision, seizures, which may be signs of posterior reversible encephalopathy syndrome (PRES) TTP--purple spots on the skin or inside the mouth, pale skin, yellowing skin or eyes, unusual weakness or fatigue, fever, fast or irregular heartbeat, confusion, change in vision, trouble speaking, trouble walking Tumor lysis syndrome (TLS)--nausea, vomiting, diarrhea, decrease in the amount of urine, dark urine, unusual weakness or fatigue, confusion, muscle pain or cramps, fast or irregular heartbeat, joint pain Side effects that usually do not require medical attention (report to your care team if they continue or are bothersome): Constipation Diarrhea Fatigue Loss of appetite Nausea This list may not describe all possible side effects. Call your doctor for medical advice about side effects. You may report side effects to FDA at 1-800-FDA-1088. Where should I keep my medication? This medication is given in a hospital or clinic. It will not be stored at home. NOTE: This sheet is a summary. It may not cover all possible information. If   you have questions about this medicine, talk to your doctor, pharmacist, or health care provider.  2024 Elsevier/Gold Standard (2022-05-06 00:00:00)        To help prevent nausea and vomiting after your treatment, we encourage you to take your nausea medication as directed.  BELOW ARE SYMPTOMS THAT SHOULD BE REPORTED IMMEDIATELY: *FEVER GREATER THAN 100.4 F (38 C) OR HIGHER *CHILLS OR SWEATING *NAUSEA AND VOMITING THAT IS NOT CONTROLLED WITH YOUR NAUSEA MEDICATION *UNUSUAL SHORTNESS OF  BREATH *UNUSUAL BRUISING OR BLEEDING *URINARY PROBLEMS (pain or burning when urinating, or frequent urination) *BOWEL PROBLEMS (unusual diarrhea, constipation, pain near the anus) TENDERNESS IN MOUTH AND THROAT WITH OR WITHOUT PRESENCE OF ULCERS (sore throat, sores in mouth, or a toothache) UNUSUAL RASH, SWELLING OR PAIN  UNUSUAL VAGINAL DISCHARGE OR ITCHING   Items with * indicate a potential emergency and should be followed up as soon as possible or go to the Emergency Department if any problems should occur.  Please show the CHEMOTHERAPY ALERT CARD or IMMUNOTHERAPY ALERT CARD at check-in to the Emergency Department and triage nurse.  Should you have questions after your visit or need to cancel or reschedule your appointment, please contact [facility] [phone]  and follow the prompts.  Office hours are 8:00 a.m. to 4:30 p.m. Monday - Friday. Please note that voicemails left after 4:00 p.m. may not be returned until the following business day.  We are closed weekends and major holidays. You have access to a nurse at all times for urgent questions. Please call the main number to the clinic [phone] and follow the prompts.  For any non-urgent questions, you may also contact your provider using MyChart. We now offer e-Visits for anyone 18 and older to request care online for non-urgent symptoms. For details visit [email or web address].   Also download the MyChart app! Go to the app store, search "MyChart", open the app, select [facility], and log in with your MyChart username and password.   

## 2023-06-19 NOTE — Progress Notes (Signed)
Patient presents today for Velcade injection. MAR reviewed and updated. Labs drawn on 06/17/2023 within parameters for treatment. Vital signs within parameters for treatment. Patient has no complaints of any side effects related to last treatment.   Treatment given today per MD orders. Tolerated without adverse affects. Vital signs stable. No complaints at this time. Discharged from clinic by wheel chair in stable condition. Alert and oriented x 3. F/U with [facility] as scheduled.

## 2023-06-23 ENCOUNTER — Other Ambulatory Visit: Payer: Self-pay

## 2023-06-23 DIAGNOSIS — C9000 Multiple myeloma not having achieved remission: Secondary | ICD-10-CM

## 2023-06-24 ENCOUNTER — Inpatient Hospital Stay: Payer: Medicare PPO

## 2023-06-24 DIAGNOSIS — C9000 Multiple myeloma not having achieved remission: Secondary | ICD-10-CM | POA: Diagnosis not present

## 2023-06-24 DIAGNOSIS — Z79899 Other long term (current) drug therapy: Secondary | ICD-10-CM | POA: Diagnosis not present

## 2023-06-24 DIAGNOSIS — Z5112 Encounter for antineoplastic immunotherapy: Secondary | ICD-10-CM | POA: Diagnosis not present

## 2023-06-24 LAB — COMPREHENSIVE METABOLIC PANEL
ALT: 25 U/L (ref 0–44)
AST: 35 U/L (ref 15–41)
Albumin: 3.2 g/dL — ABNORMAL LOW (ref 3.5–5.0)
Alkaline Phosphatase: 63 U/L (ref 38–126)
Anion gap: 6 (ref 5–15)
BUN: 27 mg/dL — ABNORMAL HIGH (ref 8–23)
CO2: 24 mmol/L (ref 22–32)
Calcium: 8.9 mg/dL (ref 8.9–10.3)
Chloride: 106 mmol/L (ref 98–111)
Creatinine, Ser: 1.2 mg/dL — ABNORMAL HIGH (ref 0.44–1.00)
GFR, Estimated: 46 mL/min — ABNORMAL LOW (ref >60)
Glucose, Bld: 102 mg/dL — ABNORMAL HIGH (ref 70–99)
Potassium: 3.8 mmol/L (ref 3.5–5.1)
Sodium: 136 mmol/L (ref 135–145)
Total Bilirubin: 0.4 mg/dL (ref 0.3–1.2)
Total Protein: 6.7 g/dL (ref 6.5–8.1)

## 2023-06-24 LAB — CBC WITH DIFFERENTIAL/PLATELET
Abs Immature Granulocytes: 0.02 10*3/uL (ref 0.00–0.07)
Basophils Absolute: 0 10*3/uL (ref 0.0–0.1)
Basophils Relative: 1 %
Eosinophils Absolute: 0.1 10*3/uL (ref 0.0–0.5)
Eosinophils Relative: 2 %
HCT: 34.5 % — ABNORMAL LOW (ref 36.0–46.0)
Hemoglobin: 11.4 g/dL — ABNORMAL LOW (ref 12.0–15.0)
Immature Granulocytes: 1 %
Lymphocytes Relative: 15 %
Lymphs Abs: 0.6 10*3/uL — ABNORMAL LOW (ref 0.7–4.0)
MCH: 32.3 pg (ref 26.0–34.0)
MCHC: 33 g/dL (ref 30.0–36.0)
MCV: 97.7 fL (ref 80.0–100.0)
Monocytes Absolute: 0.6 10*3/uL (ref 0.1–1.0)
Monocytes Relative: 13 %
Neutro Abs: 3 10*3/uL (ref 1.7–7.7)
Neutrophils Relative %: 68 %
Platelets: 76 10*3/uL — ABNORMAL LOW (ref 150–400)
RBC: 3.53 MIL/uL — ABNORMAL LOW (ref 3.87–5.11)
RDW: 15.6 % — ABNORMAL HIGH (ref 11.5–15.5)
WBC: 4.4 10*3/uL (ref 4.0–10.5)
nRBC: 0 % (ref 0.0–0.2)

## 2023-06-24 LAB — MAGNESIUM: Magnesium: 2.3 mg/dL (ref 1.7–2.4)

## 2023-06-24 NOTE — Progress Notes (Signed)
[facility] [address] [place] [address]    Clinic Day:  06/25/2023  Referring physician: Sharee Holster, NP  Patient Care Team: Sharee Holster, NP as PCP - [facility] (Geriatric [facility], [facility]   ASSESSMENT & PLAN:   Assessment: 1.  IgG lambda multiple myeloma: -She is on Revlimid 10 mg 2 weeks on/2 weeks of along with Velcade 3 weeks on/1 week off.  Dexamethasone 10 mg on days of Velcade. -Myeloma panel on 04/17/2020 shows M spike 0.5 g.  Kappa light chains are 44.  Lambda light chains are 34.  Ratio is 1.3. -Resident of [facility] since 07/19/2020.   2.  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   3.  Myeloma bone disease: - She has received Zometa for several years in the past which was discontinued.   Plan: 1.  IgG lambda multiple myeloma: - She is tolerating Velcade and low-dose dexamethasone very well. - Reviewed myeloma labs from 06/11/2023: M spike improved to 0.5 g.  Light chain ratio is normal at 0.67. - Labs today: Normal LFTs.  CBC shows moderate thrombocytopenia with platelet count 76. - Continue Velcade 3 weeks on/1 week off with dexamethasone 10 mg weekly on days of Velcade. - Recommend follow-up in 3 months with repeat myeloma labs 2 weeks prior.   2.  CKD: - Baseline creatinine between 1.2-1.5.  Creatinine today is 1.20.   3.  Diarrhea: - She denies diarrhea.   4.  Dementia: - Continue Aricept 10 mg daily.   5.  Shingles prophylaxis: - Continue acyclovir 4 mg twice daily.  Orders Placed This Encounter  Procedures   Magnesium    Standing Status:   Future    Standing Expiration Date:   09/30/2024   CBC with Differential    Standing Status:   Future    Standing Expiration Date:   10/01/2024   Comprehensive metabolic panel    Standing Status:   Future    Standing Expiration Date:   10/01/2024   Magnesium    Standing Status:   Future    Standing  Expiration Date:   10/07/2024   CBC with Differential    Standing Status:   Future    Standing Expiration Date:   10/07/2024   Comprehensive metabolic panel    Standing Status:   Future    Standing Expiration Date:   10/07/2024   Magnesium    Standing Status:   Future    Standing Expiration Date:   10/14/2024   CBC with Differential    Standing Status:   Future    Standing Expiration Date:   10/14/2024   Comprehensive metabolic panel    Standing Status:   Future    Standing Expiration Date:   10/14/2024   Magnesium    Standing Status:   Future    Standing Expiration Date:   10/28/2024   CBC with Differential    Standing Status:   Future    Standing Expiration Date:   10/29/2024   Comprehensive metabolic panel    Standing Status:   Future    Standing Expiration Date:   10/29/2024   Magnesium    Standing Status:   Future    Standing Expiration Date:   11/04/2024   CBC with Differential    Standing Status:   Future    Standing Expiration Date:   11/04/2024   Comprehensive metabolic panel  Standing Status:   Future    Standing Expiration Date:   11/04/2024   Magnesium    Standing Status:   Future    Standing Expiration Date:   11/11/2024   CBC with Differential    Standing Status:   Future    Standing Expiration Date:   11/11/2024   Comprehensive metabolic panel    Standing Status:   Future    Standing Expiration Date:   11/11/2024   Magnesium    Standing Status:   Future    Standing Expiration Date:   11/25/2024   CBC with Differential    Standing Status:   Future    Standing Expiration Date:   11/26/2024   Comprehensive metabolic panel    Standing Status:   Future    Standing Expiration Date:   11/26/2024   Magnesium    Standing Status:   Future    Standing Expiration Date:   12/02/2024   CBC with Differential    Standing Status:   Future    Standing Expiration Date:   12/02/2024   Comprehensive metabolic panel    Standing Status:   Future    Standing  Expiration Date:   12/02/2024   Magnesium    Standing Status:   Future    Standing Expiration Date:   12/09/2024   CBC with Differential    Standing Status:   Future    Standing Expiration Date:   12/09/2024   Comprehensive metabolic panel    Standing Status:   Future    Standing Expiration Date:   12/09/2024   Protein electrophoresis, serum    Standing Status:   Future    Standing Expiration Date:   06/24/2024    Order Specific Question:   Release to patient    Answer:   Immediate   Kappa/lambda light chains    Standing Status:   Future    Standing Expiration Date:   06/24/2024   CBC with Differential/Platelet    Standing Status:   Future    Standing Expiration Date:   06/24/2024    Order Specific Question:   Release to patient    Answer:   Immediate   Comprehensive metabolic panel    Standing Status:   Future    Standing Expiration Date:   06/24/2024    Order Specific Question:   Release to patient    Answer:   Immediate       I,Helena R Teague,acting as a scribe for Doreatha Massed, MD.,have documented all relevant documentation on the behalf of Doreatha Massed, MD,as directed by  Doreatha Massed, MD while in the presence of Doreatha Massed, MD.  I, Doreatha Massed MD, have reviewed the above documentation for accuracy and completeness, and I agree with the above.    Doreatha Massed, MD   7/11/20244:33 PM  CHIEF COMPLAINT:   Diagnosis: multiple myeloma    Cancer Staging  No matching staging information was found for the patient.    Prior Therapy: none  Current Therapy:  Velcade & Decadron 3/4 weeks; Revlimid 2/4 weeks    HISTORY OF PRESENT ILLNESS:   Oncology History  Multiple myeloma (HCC)  07/09/2017 Initial Diagnosis   Multiple myeloma (HCC)   06/01/2018 - 08/13/2022 Chemotherapy   Patient is on Treatment Plan : MYELOMA MAINTENANCE Bortezomib SQ D1,8,15 / Dexamethasone Oral D1,8,15 q28d      08/20/2022 -  Chemotherapy   Patient is  on Treatment Plan : MYELOMA MAINTENANCE Bortezomib SQ D1,8,15 + Dexamethasone D1,8,15 q21d  x 6 cycles        INTERVAL HISTORY:   Amanda Wu is a 77 y.o. female presenting to clinic today for follow up of multiple myeloma. She was last seen by me on 03/19/23.  Of note, she had a protein electrophoresis panel on 6/27 that showed 0.5 M spike.   Since her last visit, she was seen to the ED on 4/18 for lower GI bleeding. She underwent a flexible sigmoidoscopy that showed internal hemorrhoids and 2 erosions in sigmoid colon, no stigmata of bleeding or active bleeding noted. Patient was lept under observation then discharged on 5/19.   Today, she states that she is doing well overall. Her appetite level is at 80%. Her energy level is at 75%.  PAST MEDICAL HISTORY:   Past Medical History: 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HCC)    Dementia (HCC)    Diabetes mellitus with CKD    H/O partial nephrectomy    Hypokalemia    Hypothyroidism    Impaired cognition    Multiple myeloma (HCC)    Dr Kayleen Memos, [facility]    Surgical History: Past Surgical History:  Procedure Laterality Date   ABDOMINAL HYSTERECTOMY     CRANIOTOMY     for lymphoma   FLEXIBLE SIGMOIDOSCOPY N/A 04/03/2023   Procedure: FLEXIBLE SIGMOIDOSCOPY;  Surgeon: Dolores Frame, MD;  Location: [facility];  Service: Gastroenterology;  Laterality: N/A;   YAG LASER APPLICATION Right 07/02/2015   Procedure: YAG LASER APPLICATION;  Surgeon: Susa Simmonds, MD;  Location: [facility] ORS;  Service: Ophthalmology;  Laterality: Right;    Social History: Social History   Socioeconomic History   Marital status: Single    Spouse name: Not on file   Number of children: Not on file   Years of education: Not on file   Highest education level: Not on file  Occupational History   Occupation: retired   Tobacco Use   Smoking status: Never   Smokeless tobacco: Never   Vaping Use   Vaping status: Never Used  Substance and Sexual Activity   Alcohol use: No   Drug use: No   Sexual activity: Never  Other Topics Concern   Not on file  Social History Narrative   Long term resident of [facility]    Social Determinants of Health   Financial Resource Strain: Low Risk  (10/16/2020)   Overall Financial Resource Strain (CARDIA)    Difficulty of Paying Living Expenses: Not very hard  Food Insecurity: No Food Insecurity (04/02/2023)   Hunger Vital Sign    Worried About Running Out of Food in the Last Year: Never true    Ran Out of Food in the Last Year: Never true  Transportation Needs: No Transportation Needs (04/02/2023)   PRAPARE - Administrator, Civil Service (Medical): No    Lack of Transportation (Non-Medical): No  Physical Activity: Inactive (10/16/2020)   Exercise Vital Sign    Days of Exercise per Week: 0 days    Minutes of Exercise per Session: 0 min  Stress: No Stress Concern Present (10/16/2020)   [employer] of Occupational Health - Occupational Stress Questionnaire    Feeling of Stress : Not at all  Social Connections: Moderately Isolated (10/16/2020)   Social Connection and Isolation Panel [NHANES]    Frequency of Communication with Friends and Family: More than three times a week    Frequency of Social  Gatherings with Friends and Family: Once a week    Attends Religious Services: More than 4 times per year    Active Member of [employer] or Organizations: No    Attends Banker Meetings: Never    Marital Status: Never married  Intimate Partner Violence: Not At Risk (04/02/2023)   Humiliation, Afraid, Rape, and Kick questionnaire    Fear of Current or Ex-Partner: No    Emotionally Abused: No    Physically Abused: No    Sexually Abused: No    Family History: Family History  Problem Relation Age of Onset   Depression Mother    Diabetes Mother    Heart disease Father    Cancer - Prostate Brother    Cancer Brother     Cancer Sister     Current Medications:  Current Outpatient Medications:    acyclovir (ZOVIRAX) 400 MG tablet, TAKE 1 TABLET BY MOUTH TWICE DAILY, Disp: 60 tablet, Rfl: 2   aspirin EC 81 MG tablet, Take 81 mg by mouth daily., Disp: , Rfl:    busPIRone (BUSPAR) 10 MG tablet, Take 20 mg by mouth 3 (three) times daily. For anxiety, Disp: , Rfl:    Calcium Carbonate-Vit D-Min (CALCIUM 600+D PLUS MINERALS) 600-400 MG-UNIT CHEW, Chew 1 tablet by mouth daily., Disp: , Rfl:    cholestyramine (QUESTRAN) 4 g packet, Take 4 g by mouth 2 (two) times daily between meals., Disp: , Rfl:    LACTOBACILLUS PROBIOTIC PO, Take 2 capsules by mouth daily., Disp: , Rfl:    levothyroxine (SYNTHROID, LEVOTHROID) 25 MCG tablet, Take 25 mcg by mouth daily before breakfast., Disp: , Rfl:    lipase/protease/amylase (CREON) 36000 UNITS CPEP capsule, 36,000-114,000- 180,000 unit; amt: 1; oral Special Instructions: Take one capsule with snacks if eaten between meals. Twice A Day Between Meals as needed, Disp: , Rfl:    lipase/protease/amylase (CREON) 36000 UNITS CPEP capsule, Take 3 capsules by mouth 3 (three) times daily with meals., Disp: , Rfl:    loperamide (IMODIUM A-D) 2 MG tablet, Take 2 mg by mouth 3 (three) times daily. For diarrhea, Disp: , Rfl:    losartan (COZAAR) 50 MG tablet, Take 1 tablet (50 mg total) by mouth daily., Disp: 30 tablet, Rfl: 4   NON FORMULARY, Dysphagia 2 diet with thin liquids, Disp: , Rfl:    NON FORMULARY, Wanderguard #[id number] to ankle for safety awareness. Check placement and function qshift. Special Instructions: Check placement and function qshift. Every Shift Day, Evening, Night, Disp: , Rfl:    potassium chloride (MICRO-K) 10 MEQ CR capsule, Take by mouth 2 (two) times daily., Disp: , Rfl:    potassium chloride SA (KLOR-CON) 20 MEQ tablet, Take 20 mEq by mouth daily. , Disp: , Rfl:  No current facility-administered medications for this visit.  Facility-Administered Medications Ordered in  Other Visits:    cyanocobalamin ((VITAMIN B-12)) 1000 MCG/ML injection, , , ,    cyanocobalamin ((VITAMIN B-12)) 1000 MCG/ML injection, , , ,    denosumab (XGEVA) 120 MG/1.7ML injection, , , ,    dexamethasone (DECADRON) 4 MG tablet, , , ,    dexamethasone (DECADRON) 4 MG tablet, , , ,    diphenhydrAMINE (BENADRYL) 50 MG/ML injection, , , ,    diphenhydrAMINE (BENADRYL) 50 MG/ML injection, , , ,    diphenhydrAMINE (BENADRYL) 50 MG/ML injection, , , ,    epoetin alfa-epbx (RETACRIT) 09811 UNIT/ML injection, , , ,    epoetin alfa-epbx (RETACRIT) 91478 UNIT/ML injection, , , ,  epoetin alfa-epbx (RETACRIT) 11914 UNIT/ML injection, , , ,    fulvestrant (FASLODEX) 250 MG/5ML injection, , , ,    lanreotide acetate (SOMATULINE DEPOT) 120 MG/0.5ML injection, , , ,    lanreotide acetate (SOMATULINE DEPOT) 120 MG/0.5ML injection, , , ,    lanreotide acetate (SOMATULINE DEPOT) 120 MG/0.5ML injection, , , ,    lanreotide acetate (SOMATULINE DEPOT) 120 MG/0.5ML injection, , , ,    lidocaine (PF) (XYLOCAINE) 1 % injection, , , ,    magnesium sulfate 2 GM/50ML IVPB, , , ,    octreotide (SANDOSTATIN LAR) 30 MG IM injection, , , ,    octreotide (SANDOSTATIN LAR) 30 MG IM injection, , , ,    palonosetron (ALOXI) 0.25 MG/5ML injection, , , ,    palonosetron (ALOXI) 0.25 MG/5ML injection, , , ,    palonosetron (ALOXI) 0.25 MG/5ML injection, , , ,    palonosetron (ALOXI) 0.25 MG/5ML injection, , , ,    potassium chloride 10 MEQ/100ML IVPB, , , ,    potassium chloride SA (KLOR-CON M) 20 MEQ CR tablet, , , ,    potassium chloride SA (KLOR-CON M) 20 MEQ CR tablet, , , ,    prochlorperazine (COMPAZINE) 10 MG tablet, , , ,    prochlorperazine (COMPAZINE) 10 MG tablet, , , ,    Allergies: Allergies  Allergen Reactions   Lotensin [Benazepril]     REVIEW OF SYSTEMS:   Review of Systems  Constitutional:  Negative for chills, fatigue and fever.  HENT:   Negative for lump/mass, mouth sores, nosebleeds,  sore throat and trouble swallowing.   Eyes:  Negative for eye problems.  Respiratory:  Negative for cough and shortness of breath.   Cardiovascular:  Negative for chest pain, leg swelling and palpitations.  Gastrointestinal:  Negative for abdominal pain, constipation, diarrhea, nausea and vomiting.  Genitourinary:  Negative for bladder incontinence, difficulty urinating, dysuria, frequency, hematuria and nocturia.   Musculoskeletal:  Negative for arthralgias, back pain, flank pain, myalgias and neck pain.  Skin:  Negative for itching and rash.  Neurological:  Negative for dizziness, headaches and numbness.  Hematological:  Does not bruise/bleed easily.  Psychiatric/Behavioral:  Negative for depression, sleep disturbance and suicidal ideas. The patient is not nervous/anxious.   All other systems reviewed and are negative.    VITALS:   Blood pressure (!) 144/76, pulse 89, temperature 98 F (36.7 C), resp. rate 18, SpO2 91%.  Wt Readings from Last 3 Encounters:  06/19/23 117 lb 14.4 oz (53.5 kg)  06/11/23 121 lb 6.4 oz (55.1 kg)  05/28/23 120 lb (54.4 kg)    There is no height or weight on file to calculate BMI.  Performance status (ECOG): 1 - Symptomatic but completely ambulatory  PHYSICAL EXAM:   Physical Exam Vitals and nursing note reviewed. Exam conducted with a chaperone present.  Constitutional:      Appearance: Normal appearance.  Cardiovascular:     Rate and Rhythm: Normal rate and regular rhythm.     Pulses: Normal pulses.     Heart sounds: Normal heart sounds.  Pulmonary:     Effort: Pulmonary effort is normal.     Breath sounds: Normal breath sounds.  Abdominal:     Palpations: Abdomen is soft. There is no hepatomegaly, splenomegaly or mass.     Tenderness: There is no abdominal tenderness.  Musculoskeletal:     Right lower leg: No edema.     Left lower leg: No edema.  Lymphadenopathy:  Cervical: No cervical adenopathy.     Right cervical: No superficial,  deep or posterior cervical adenopathy.    Left cervical: No superficial, deep or posterior cervical adenopathy.     Upper Body:     Right upper body: No supraclavicular or axillary adenopathy.     Left upper body: No supraclavicular or axillary adenopathy.  Neurological:     General: No focal deficit present.     Mental Status: She is alert and oriented to person, place, and time.  Psychiatric:        Mood and Affect: Mood normal.        Behavior: Behavior normal.     LABS:      Latest Ref Rng & Units 06/24/2023   10:59 AM 06/17/2023   10:30 AM 06/11/2023    1:25 PM  CBC  WBC 4.0 - 10.5 K/uL 4.4  4.0  4.2   Hemoglobin 12.0 - 15.0 g/dL 16.1  09.6  04.5   Hematocrit 36.0 - 46.0 % 34.5  34.6  33.2   Platelets 150 - 400 K/uL 76  102  119       Latest Ref Rng & Units 06/24/2023   10:59 AM 06/17/2023   10:30 AM 06/11/2023    1:25 PM  CMP  Glucose 70 - 99 mg/dL 409  99  98   BUN 8 - 23 mg/dL 27  20  26    Creatinine 0.44 - 1.00 mg/dL 8.11  9.14  7.82   Sodium 135 - 145 mmol/L 136  138  139   Potassium 3.5 - 5.1 mmol/L 3.8  3.7  3.8   Chloride 98 - 111 mmol/L 106  105  108   CO2 22 - 32 mmol/L 24  25  23    Calcium 8.9 - 10.3 mg/dL 8.9  8.5  8.8   Total Protein 6.5 - 8.1 g/dL 6.7  6.7  6.8   Total Bilirubin 0.3 - 1.2 mg/dL 0.4  0.5  0.5   Alkaline Phos 38 - 126 U/L 63  60  65   AST 15 - 41 U/L 35  34  44   ALT 0 - 44 U/L 25  27  31       No results found for: "CEA1", "CEA" / No results found for: "CEA1", "CEA" No results found for: "PSA1" No results found for: "CAN199" No results found for: "CAN125"  Lab Results  Component Value Date   TOTALPROTELP 6.0 06/11/2023   ALBUMINELP 3.3 06/11/2023   A1GS 0.2 06/11/2023   A2GS 0.6 06/11/2023   BETS 0.7 06/11/2023   GAMS 1.2 06/11/2023   MSPIKE 0.5 (H) 06/11/2023   SPEI Comment 06/11/2023   Lab Results  Component Value Date   TIBC 217 (L) 06/09/2022   FERRITIN 35 06/09/2022   IRONPCTSAT 19 06/09/2022   Lab Results   Component Value Date   LDH 225 (H) 10/02/2022   LDH 208 (H) 07/09/2022   LDH 202 (H) 06/09/2022     STUDIES:   No results found.

## 2023-06-25 ENCOUNTER — Inpatient Hospital Stay (HOSPITAL_BASED_OUTPATIENT_CLINIC_OR_DEPARTMENT_OTHER): Payer: Medicare PPO | Admitting: Hematology

## 2023-06-25 ENCOUNTER — Inpatient Hospital Stay: Payer: Medicare PPO

## 2023-06-25 VITALS — BP 144/76 | HR 89 | Temp 98.0°F | Resp 18

## 2023-06-25 DIAGNOSIS — C9000 Multiple myeloma not having achieved remission: Secondary | ICD-10-CM

## 2023-06-25 DIAGNOSIS — Z5112 Encounter for antineoplastic immunotherapy: Secondary | ICD-10-CM | POA: Diagnosis not present

## 2023-06-25 DIAGNOSIS — Z79899 Other long term (current) drug therapy: Secondary | ICD-10-CM | POA: Diagnosis not present

## 2023-06-25 MED ORDER — BORTEZOMIB CHEMO SQ INJECTION 3.5 MG (2.5MG/ML)
1.3000 mg/m2 | Freq: Once | INTRAMUSCULAR | Status: AC
  Administered 2023-06-25: 2 mg via SUBCUTANEOUS
  Filled 2023-06-25: qty 0.8

## 2023-06-25 MED ORDER — DEXAMETHASONE 4 MG PO TABS
10.0000 mg | ORAL_TABLET | Freq: Once | ORAL | Status: AC
  Administered 2023-06-25: 10 mg via ORAL
  Filled 2023-06-25: qty 3

## 2023-06-25 NOTE — Progress Notes (Signed)
Patient has been assessed, vital signs and labs have been reviewed by Dr. Katragadda. ANC, Creatinine, LFTs, and Platelets are within treatment parameters per Dr. Katragadda. The patient is good to proceed with treatment at this time. Primary RN and pharmacy aware.  

## 2023-06-25 NOTE — Patient Instructions (Addendum)
[facility]  Discharge Instructions  You were seen and examined today by Dr. Ellin Saba.  Dr. Ellin Saba discussed your most recent lab work which revealed that everything looks good and stable.  Continue getting your Velcade injection.  Follow-up as scheduled in 3 months.    Thank you for choosing [name] to provide your oncology and hematology care.   To afford each patient quality time with our provider, please arrive at least 15 minutes before your scheduled appointment time. You may need to reschedule your appointment if you arrive late (10 or more minutes). Arriving late affects you and other patients whose appointments are after yours.  Also, if you miss three or more appointments without notifying the office, you may be dismissed from the clinic at the provider's discretion.    Again, thank you for choosing [facility].  Our hope is that these requests will decrease the amount of time that you wait before being seen by our physicians.   If you have a lab appointment with the Cancer Center - please note that after April 8th, all labs will be drawn in the cancer center.  You do not have to check in or register with the main entrance as you have in the past but will complete your check-in at the cancer center.            _____________________________________________________________  Should you have questions after your visit to [facility], please contact our office at [phone] and follow the prompts.  Our office hours are 8:00 a.m. to 4:30 p.m. Monday - Thursday and 8:00 a.m. to 2:30 p.m. Friday.  Please note that voicemails left after 4:00 p.m. may not be returned until the following business day.  We are closed weekends and all major holidays.  You do have access to a nurse 24-7, just call the main number to the clinic [phone] and do not press any options, hold on the line and a nurse will  answer the phone.    For prescription refill requests, have your pharmacy contact our office and allow 72 hours.    Masks are no longer required in the cancer centers. If you would like for your care team to wear a mask while they are taking care of you, please let them know. You may have one support person who is at least 79 years old accompany you for your appointments.

## 2023-06-26 ENCOUNTER — Other Ambulatory Visit: Payer: Self-pay

## 2023-07-03 ENCOUNTER — Encounter: Payer: Self-pay | Admitting: Adult Health

## 2023-07-03 ENCOUNTER — Non-Acute Institutional Stay (SKILLED_NURSING_FACILITY): Payer: Medicare PPO | Admitting: Adult Health

## 2023-07-03 DIAGNOSIS — I700 Atherosclerosis of aorta: Secondary | ICD-10-CM | POA: Diagnosis not present

## 2023-07-03 DIAGNOSIS — E1122 Type 2 diabetes mellitus with diabetic chronic kidney disease: Secondary | ICD-10-CM | POA: Diagnosis not present

## 2023-07-03 DIAGNOSIS — D696 Thrombocytopenia, unspecified: Secondary | ICD-10-CM

## 2023-07-03 DIAGNOSIS — I129 Hypertensive chronic kidney disease with stage 1 through stage 4 chronic kidney disease, or unspecified chronic kidney disease: Secondary | ICD-10-CM | POA: Diagnosis not present

## 2023-07-03 DIAGNOSIS — N1832 Chronic kidney disease, stage 3b: Secondary | ICD-10-CM | POA: Diagnosis not present

## 2023-07-03 NOTE — Progress Notes (Signed)
Location:  [facility] Room Number: 148 Place of Service:  SNF (31)   CODE STATUS: dnr   Allergies  Allergen Reactions   Lotensin [Benazepril]     Chief Complaint  Patient presents with   Acute Visit    Care plan meeting.     HPI:  We have come together for her care plan meeting. Family present. BIMS 5/15 mood 0/30. Wheelchair bound: without falls. She requires moderate assist with her adls. She is frequently incontinent of bladder and bowel. Dietary: setup with meals; D2 thin in bowls 50-100%. Therapy: none at this time. Activities: socializing pets; reading music. She will continue to be followed for her chronic illnesses including:    Aortic atherosclerosis   Hypertension associated with stage 3b chronic kidney disease due to type 2 diabetes mellitus   Thrombocytopenia  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HCC)    Dementia (HCC)    Diabetes mellitus with CKD    H/O partial nephrectomy    Hypokalemia    Hypothyroidism    Impaired cognition    Multiple myeloma (HCC)    Dr Kayleen Memos, [facility]    Past Surgical History:  Procedure Laterality Date   ABDOMINAL HYSTERECTOMY     CRANIOTOMY     for lymphoma   FLEXIBLE SIGMOIDOSCOPY N/A 04/03/2023   Procedure: FLEXIBLE SIGMOIDOSCOPY;  Surgeon: Dolores Frame, MD;  [facility]: [facility];  Service: Gastroenterology;  Laterality: N/A;   YAG LASER APPLICATION Right 07/02/2015   Procedure: YAG LASER APPLICATION;  Surgeon: Susa Simmonds, MD;  Location: [facility];  Service: Ophthalmology;  Laterality: Right;    Social History   Socioeconomic History   Marital status: Single    Spouse name: Not on file   Number of children: Not on file   Years of education: Not on file   Highest education level: Not on file  Occupational History   Occupation: retired   Tobacco Use   Smoking status: Never   Smokeless tobacco: Never  Vaping  Use   Vaping status: Never Used  Substance and Sexual Activity   Alcohol use: No   Drug use: No   Sexual activity: Never  Other Topics Concern   Not on file  Social History Narrative   Long term resident of [facility]    Social Determinants of Health   Financial Resource Strain: Low Risk  (10/16/2020)   Overall Financial Resource Strain (CARDIA)    Difficulty of Paying Living Expenses: Not very hard  Food Insecurity: No Food Insecurity (04/02/2023)   Hunger Vital Sign    Worried About Running Out of Food in the Last Year: Never true    Ran Out of Food in the Last Year: Never true  Transportation Needs: No Transportation Needs (04/02/2023)   PRAPARE - Administrator, Civil Service (Medical): No    Lack of Transportation (Non-Medical): No  Physical Activity: Inactive (10/16/2020)   Exercise Vital Sign    Days of Exercise per Week: 0 days    Minutes of Exercise per Session: 0 min  Stress: No Stress Concern Present (10/16/2020)   [employer] of Occupational Health - Occupational Stress Questionnaire    Feeling of Stress : Not at all  Social Connections: Moderately Isolated (10/16/2020)   Social Connection and Isolation Panel [NHANES]    Frequency of Communication with Friends and Family: More than three times a  week    Frequency of Social Gatherings with Friends and Family: Once a week    Attends Religious Services: More than 4 times per year    Active Member of [employer] or Organizations: No    Attends Banker Meetings: Never    Marital Status: Never married  Intimate Partner Violence: Not At Risk (04/02/2023)   Humiliation, Afraid, Rape, and Kick questionnaire    Fear of Current or Ex-Partner: No    Emotionally Abused: No    Physically Abused: No    Sexually Abused: No   Family History  Problem Relation Age of Onset   Depression Mother    Diabetes Mother    Heart disease Father    Cancer - Prostate Brother    Cancer Brother    Cancer Sister        VITAL SIGNS BP 130/75   Pulse 70   Temp 97.6 F (36.4 C)   Resp 18   SpO2 97%   Unable to complete medication list Matrix unavailable   Outpatient Encounter Medications as of 07/03/2023  Medication Sig   acyclovir (ZOVIRAX) 400 MG tablet TAKE 1 TABLET BY MOUTH TWICE DAILY   aspirin EC 81 MG tablet Take 81 mg by mouth daily.   busPIRone (BUSPAR) 10 MG tablet Take 20 mg by mouth 3 (three) times daily. For anxiety   Calcium Carbonate-Vit D-Min (CALCIUM 600+D PLUS MINERALS) 600-400 MG-UNIT CHEW Chew 1 tablet by mouth daily.   cholestyramine (QUESTRAN) 4 g packet Take 4 g by mouth 2 (two) times daily between meals.   LACTOBACILLUS PROBIOTIC PO Take 2 capsules by mouth daily.   levothyroxine (SYNTHROID, LEVOTHROID) 25 MCG tablet Take 25 mcg by mouth daily before breakfast.   lipase/protease/amylase (CREON) 36000 UNITS CPEP capsule 36,000-114,000- 180,000 unit; amt: 1; oral Special Instructions: Take one capsule with snacks if eaten between meals. Twice A Day Between Meals as needed   lipase/protease/amylase (CREON) 36000 UNITS CPEP capsule Take 3 capsules by mouth 3 (three) times daily with meals.   loperamide (IMODIUM A-D) 2 MG tablet Take 2 mg by mouth 3 (three) times daily. For diarrhea   losartan (COZAAR) 50 MG tablet Take 1 tablet (50 mg total) by mouth daily.   NON FORMULARY Dysphagia 2 diet with thin liquids   NON FORMULARY Wanderguard #[id number] to ankle for safety awareness. Check placement and function qshift. Special Instructions: Check placement and function qshift. Every Shift Day, Evening, Night   potassium chloride (MICRO-K) 10 MEQ CR capsule Take by mouth 2 (two) times daily.   potassium chloride SA (KLOR-CON) 20 MEQ tablet Take 20 mEq by mouth daily.    Facility-Administered Encounter Medications as of 07/03/2023  Medication   cyanocobalamin ((VITAMIN B-12)) 1000 MCG/ML injection   cyanocobalamin ((VITAMIN B-12)) 1000 MCG/ML injection   denosumab (XGEVA) 120  MG/1.7ML injection   dexamethasone (DECADRON) 4 MG tablet   dexamethasone (DECADRON) 4 MG tablet   diphenhydrAMINE (BENADRYL) 50 MG/ML injection   diphenhydrAMINE (BENADRYL) 50 MG/ML injection   diphenhydrAMINE (BENADRYL) 50 MG/ML injection   epoetin alfa-epbx (RETACRIT) 40981 UNIT/ML injection   epoetin alfa-epbx (RETACRIT) 19147 UNIT/ML injection   epoetin alfa-epbx (RETACRIT) 82956 UNIT/ML injection   fulvestrant (FASLODEX) 250 MG/5ML injection   lanreotide acetate (SOMATULINE DEPOT) 120 MG/0.5ML injection   lanreotide acetate (SOMATULINE DEPOT) 120 MG/0.5ML injection   lanreotide acetate (SOMATULINE DEPOT) 120 MG/0.5ML injection   lanreotide acetate (SOMATULINE DEPOT) 120 MG/0.5ML injection   lidocaine (PF) (XYLOCAINE) 1 % injection   magnesium sulfate  2 GM/50ML IVPB   octreotide (SANDOSTATIN LAR) 30 MG IM injection   octreotide (SANDOSTATIN LAR) 30 MG IM injection   palonosetron (ALOXI) 0.25 MG/5ML injection   palonosetron (ALOXI) 0.25 MG/5ML injection   palonosetron (ALOXI) 0.25 MG/5ML injection   palonosetron (ALOXI) 0.25 MG/5ML injection   potassium chloride 10 MEQ/100ML IVPB   potassium chloride SA (KLOR-CON M) 20 MEQ CR tablet   potassium chloride SA (KLOR-CON M) 20 MEQ CR tablet   prochlorperazine (COMPAZINE) 10 MG tablet   prochlorperazine (COMPAZINE) 10 MG tablet     SIGNIFICANT DIAGNOSTIC EXAMS   PREVIOUS   08-10-20: t score: -3.758  03-15-23: right hand x-ray:  On lateral view: minimal linear lucency in base of mid phalanx 3rd digit compatible with acute nondisplaced hairline fracture versus vascular channel artifact.  Moderate soft tissue swelling about PIP joint 3rd digit Mild osteopenia Mild degree of osteoarthritis   04-03-23: flex sig; bleeding from internal hemorrhoids; self limited  NO NEW EXAMS       LABS REVIEWED PREVIOUS    06-25-22: wbc 4.2; hgb 10.7; hct 33.8; mcv 98.3 plt 110; glucose 97; bun 21; creat 1.10; k+ 3.6; na++ 140; ca 8.9; gfr 52;  protein 6.4; albumin 3.3 08-06-22: wbc 4.4; hgb 11.0; hct 34.5; mcv 99.4 plt 130; glucose 138; bun 24; creat 1.38; k+ 4.1; na++ 141; ca 8.9; gfr 39; protein 6.5; albumin 3.2; mag 2.3 09-09-22: wbc 5.1; hgb 11.3; hct 34.5; mcv 99.4 plt 132; glucose 119; bun 22; creat 1.29; k+ 4.1; na++ 143 ca 8.8; gfr 42; protein 6.6 albumin 3.2; mag 2.2   10-09-22: wbc 4.3; hgb 11.3; hct 35.0; mcv 101.7 plt 93; glucose 147; bun 21; creat 1.15; k+ 3.6; na++ 146; ca 9.0; gfr 49; protein 6.8; albumin 3.3  12-24-22: wbc 4.9; hgb 11.8; hct 36.1; mcv 101.4 plt 154; glucose 95; bun 21; creat 1.35; k+ 4.1; na++ 141; ca 9.1; gfr 40; mag 2.4; protein 6.7; albumin 3.5  01-19-23: hgb A1c; 5.6; micro/creat ratio: 6  01-21-23: wbc 4.5 hgb 11.7; hct 35.5; mcv 101.7 plt 132; glucose 110; bun 28;creat 1.59; k+ 4.1; na++ 140; ca 8.8; gfr 33; protein 6.7 albumin 3.4 mag 2.5 02-25-23; wbc 3.1; hgb 11.4; hct 34.5; mcv 101.5 plt 88; glucose 116; bun 24; creat 1.52; k+ 4.1; na++ 143; ca 8.7; gfr 35; protein 6.5; albumin 3.2 mag 2.3 04-03-23: wbc 4.1; hgb 10.7; hct 32.1; mcv 97.3 plt 66; glucose 73; bun 18; creat 1.00; k+ 3.7; na++ 140; ca 8.2 gfr 58 #2: wbc 4.1; hgb 11.4; hct 34.0; mcv 98.3 plt 69  05-27-23: wbc 4.6; hgb 12.3; hct 36.8; mcv 98.7 plt 70; glucose 142; bun 22; creat 1.16; k+ 3.7; na++ 139; ca 8.6; gfr 48; protein 6.8 albumin 3.4; mag 2.2   NO NEW LABS.    Review of Systems  Constitutional:  Negative for malaise/fatigue.  Respiratory:  Negative for cough and shortness of breath.   Cardiovascular:  Negative for chest pain, palpitations and leg swelling.  Gastrointestinal:  Negative for abdominal pain, constipation and heartburn.  Musculoskeletal:  Negative for back pain, joint pain and myalgias.  Skin: Negative.   Neurological:  Negative for dizziness.  Psychiatric/Behavioral:  The patient is not nervous/anxious.     Physical Exam Constitutional:      General: She is not in acute distress.    Appearance: She is underweight.  She is not diaphoretic.  Neck:     Thyroid: No thyromegaly.  Cardiovascular:     Rate and Rhythm:  Normal rate and regular rhythm.     Pulses: Normal pulses.     Heart sounds: Normal heart sounds.  Pulmonary:     Effort: Pulmonary effort is normal. No respiratory distress.     Breath sounds: Normal breath sounds.  Abdominal:     General: Bowel sounds are normal. There is no distension.     Palpations: Abdomen is soft.     Tenderness: There is no abdominal tenderness.  Musculoskeletal:        General: Normal range of motion.     Cervical back: Neck supple.     Right lower leg: No edema.     Left lower leg: No edema.  Lymphadenopathy:     Cervical: No cervical adenopathy.  Skin:    General: Skin is warm and dry.  Neurological:     Mental Status: She is alert. Mental status is at baseline.  Psychiatric:        Mood and Affect: Mood normal.     ASSESSMENT/ PLAN:  TODAY  Aortic atherosclerosis Hypertension associated with stage 3b chronic kidney disease due to type 2 diabetes mellitus Thrombocytopenia  Will continue current medications Will continue current plan of care Will continue to monitor her status.   Time spent with patient: 35 minutes: medications; health care status; dietary.    Synthia Innocent NP [facility]  call [phone]

## 2023-07-06 ENCOUNTER — Encounter: Payer: Self-pay | Admitting: Adult Health

## 2023-07-06 ENCOUNTER — Non-Acute Institutional Stay (SKILLED_NURSING_FACILITY): Payer: Medicare PPO | Admitting: Adult Health

## 2023-07-06 DIAGNOSIS — K8689 Other specified diseases of pancreas: Secondary | ICD-10-CM | POA: Diagnosis not present

## 2023-07-06 DIAGNOSIS — F01518 Vascular dementia, unspecified severity, with other behavioral disturbance: Secondary | ICD-10-CM | POA: Diagnosis not present

## 2023-07-06 DIAGNOSIS — D539 Nutritional anemia, unspecified: Secondary | ICD-10-CM

## 2023-07-06 NOTE — Progress Notes (Unsigned)
Location:  [facility] Room Number: 148 Place of Service:  SNF (31)   CODE STATUS: dnr  Allergies  Allergen Reactions   Lotensin [Benazepril]     Chief Complaint  Patient presents with   Medical Management of Chronic Issues         Pancreatic insufficiency:   Macrocytic anemia:    Vascular dementia with behavioral disturbance     HPI:  She is a 77 year old long term resident of this facility being seen for the management of her chronic illnesses: Pancreatic insufficiency:   Macrocytic anemia:    Vascular dementia with behavioral disturbance. There are no reports of pain present. Her weight is stable. There are no reports of diarrheal stools present.    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HCC)    Dementia (HCC)    Diabetes mellitus with CKD    H/O partial nephrectomy    Hypokalemia    Hypothyroidism    Impaired cognition    Multiple myeloma (HCC)    Dr Kayleen Memos, [facility]    Past Surgical History:  Procedure Laterality Date   ABDOMINAL HYSTERECTOMY     CRANIOTOMY     for lymphoma   FLEXIBLE SIGMOIDOSCOPY N/A 04/03/2023   Procedure: FLEXIBLE SIGMOIDOSCOPY;  Surgeon: Dolores Frame, MD;  Location: [facility];  Service: Gastroenterology;  Laterality: N/A;   YAG LASER APPLICATION Right 07/02/2015   Procedure: YAG LASER APPLICATION;  Surgeon: Susa Simmonds, MD;  Location: [facility];  Service: Ophthalmology;  Laterality: Right;    Social History   Socioeconomic History   Marital status: Single    Spouse name: Not on file   Number of children: Not on file   Years of education: Not on file   Highest education level: Not on file  Occupational History   Occupation: retired   Tobacco Use   Smoking status: Never   Smokeless tobacco: Never  Vaping Use   Vaping status: Never Used  Substance and Sexual Activity   Alcohol use: No   Drug use: No   Sexual activity:  Never  Other Topics Concern   Not on file  Social History Narrative   Long term resident of [facility]    Social Determinants of Health   Financial Resource Strain: Low Risk  (10/16/2020)   Overall Financial Resource Strain (CARDIA)    Difficulty of Paying Living Expenses: Not very hard  Food Insecurity: No Food Insecurity (04/02/2023)   Hunger Vital Sign    Worried About Running Out of Food in the Last Year: Never true    Ran Out of Food in the Last Year: Never true  Transportation Needs: No Transportation Needs (04/02/2023)   PRAPARE - Administrator, Civil Service (Medical): No    Lack of Transportation (Non-Medical): No  Physical Activity: Inactive (10/16/2020)   Exercise Vital Sign    Days of Exercise per Week: 0 days    Minutes of Exercise per Session: 0 min  Stress: No Stress Concern Present (10/16/2020)   [employer] of Occupational Health - Occupational Stress Questionnaire    Feeling of Stress : Not at all  Social Connections: Moderately Isolated (10/16/2020)   Social Connection and Isolation Panel [NHANES]    Frequency of Communication with Friends and Family: More than three times a week    Frequency of Social Gatherings with Friends and Family: Once a  week    Attends Religious Services: More than 4 times per year    Active Member of Clubs or Organizations: No    Attends Banker Meetings: Never    Marital Status: Never married  Intimate Partner Violence: Not At Risk (04/02/2023)   Humiliation, Afraid, Rape, and Kick questionnaire    Fear of Current or Ex-Partner: No    Emotionally Abused: No    Physically Abused: No    Sexually Abused: No   Family History  Problem Relation Age of Onset   Depression Mother    Diabetes Mother    Heart disease Father    Cancer - Prostate Brother    Cancer Brother    Cancer Sister       VITAL SIGNS BP 121/71   Pulse 82   Temp 97.7 F (36.5 C)   Resp 18   Ht 5\' 9"  (1.753 m)   Wt 123 lb 3.2 oz (55.9  kg)   SpO2 99%   BMI 18.19 kg/m   Outpatient Encounter Medications as of 07/06/2023  Medication Sig   acyclovir (ZOVIRAX) 400 MG tablet TAKE 1 TABLET BY MOUTH TWICE DAILY   aspirin EC 81 MG tablet Take 81 mg by mouth daily.   busPIRone (BUSPAR) 10 MG tablet Take 20 mg by mouth 3 (three) times daily. For anxiety   Calcium Carbonate-Vit D-Min (CALCIUM 600+D PLUS MINERALS) 600-400 MG-UNIT CHEW Chew 1 tablet by mouth daily.   cholestyramine (QUESTRAN) 4 g packet Take 4 g by mouth 2 (two) times daily between meals.   LACTOBACILLUS PROBIOTIC PO Take 2 capsules by mouth daily.   levothyroxine (SYNTHROID, LEVOTHROID) 25 MCG tablet Take 25 mcg by mouth daily before breakfast.   lipase/protease/amylase (CREON) 36000 UNITS CPEP capsule 36,000-114,000- 180,000 unit; amt: 1; oral Special Instructions: Take one capsule with snacks if eaten between meals. Twice A Day Between Meals as needed   lipase/protease/amylase (CREON) 36000 UNITS CPEP capsule Take 3 capsules by mouth 3 (three) times daily with meals.   loperamide (IMODIUM A-D) 2 MG tablet Take 2 mg by mouth 3 (three) times daily. For diarrhea   losartan (COZAAR) 50 MG tablet Take 1 tablet (50 mg total) by mouth daily.   NON FORMULARY Dysphagia 2 diet with thin liquids   NON FORMULARY Wanderguard #[id number] to ankle for safety awareness. Check placement and function qshift. Special Instructions: Check placement and function qshift. Every Shift Day, Evening, Night   potassium chloride (MICRO-K) 10 MEQ CR capsule Take by mouth 2 (two) times daily.   potassium chloride SA (KLOR-CON) 20 MEQ tablet Take 20 mEq by mouth daily.      SIGNIFICANT DIAGNOSTIC EXAMS  PREVIOUS   08-10-20: t score: -3.758  03-15-23: right hand x-ray:  On lateral view: minimal linear lucency in base of mid phalanx 3rd digit compatible with acute nondisplaced hairline fracture versus vascular channel artifact.  Moderate soft tissue swelling about PIP joint 3rd digit Mild  osteopenia Mild degree of osteoarthritis   04-03-23: flex sig; bleeding from internal hemorrhoids; self limited  NO NEW EXAMS       LABS REVIEWED PREVIOUS    06-25-22: wbc 4.2; hgb 10.7; hct 33.8; mcv 98.3 plt 110; glucose 97; bun 21; creat 1.10; k+ 3.6; na++ 140; ca 8.9; gfr 52; protein 6.4; albumin 3.3 08-06-22: wbc 4.4; hgb 11.0; hct 34.5; mcv 99.4 plt 130; glucose 138; bun 24; creat 1.38; k+ 4.1; na++ 141; ca 8.9; gfr 39; protein 6.5; albumin 3.2; mag 2.3 09-09-22: wbc  5.1; hgb 11.3; hct 34.5; mcv 99.4 plt 132; glucose 119; bun 22; creat 1.29; k+ 4.1; na++ 143 ca 8.8; gfr 42; protein 6.6 albumin 3.2; mag 2.2   10-09-22: wbc 4.3; hgb 11.3; hct 35.0; mcv 101.7 plt 93; glucose 147; bun 21; creat 1.15; k+ 3.6; na++ 146; ca 9.0; gfr 49; protein 6.8; albumin 3.3  12-24-22: wbc 4.9; hgb 11.8; hct 36.1; mcv 101.4 plt 154; glucose 95; bun 21; creat 1.35; k+ 4.1; na++ 141; ca 9.1; gfr 40; mag 2.4; protein 6.7; albumin 3.5  01-19-23: hgb A1c; 5.6; micro/creat ratio: 6  01-21-23: wbc 4.5 hgb 11.7; hct 35.5; mcv 101.7 plt 132; glucose 110; bun 28;creat 1.59; k+ 4.1; na++ 140; ca 8.8; gfr 33; protein 6.7 albumin 3.4 mag 2.5 02-25-23; wbc 3.1; hgb 11.4; hct 34.5; mcv 101.5 plt 88; glucose 116; bun 24; creat 1.52; k+ 4.1; na++ 143; ca 8.7; gfr 35; protein 6.5; albumin 3.2 mag 2.3 04-03-23: wbc 4.1; hgb 10.7; hct 32.1; mcv 97.3 plt 66; glucose 73; bun 18; creat 1.00; k+ 3.7; na++ 140; ca 8.2 gfr 58 #2: wbc 4.1; hgb 11.4; hct 34.0; mcv 98.3 plt 69  05-27-23: wbc 4.6; hgb 12.3; hct 36.8; mcv 98.7 plt 70; glucose 142; bun 22; creat 1.16; k+ 3.7; na++ 139; ca 8.6; gfr 48; protein 6.8 albumin 3.4; mag 2.2   TODAY  06-24-23: wbc 4.4; hgb 11.4; hct 34.5; mcv 97.7 plt 76; glucose 102; bun 27; creat 1.20; k+ 3.8; na++ 136; ca 8.9; gfr 46; protein 6.7 albumin 3.2; mag 2.3   Review of Systems  Constitutional:  Negative for malaise/fatigue.  Respiratory:  Negative for cough and shortness of breath.   Cardiovascular:   Negative for chest pain, palpitations and leg swelling.  Gastrointestinal:  Negative for abdominal pain, constipation and heartburn.  Musculoskeletal:  Negative for back pain, joint pain and myalgias.  Skin: Negative.   Neurological:  Negative for dizziness.  Psychiatric/Behavioral:  The patient is not nervous/anxious.    Physical Exam Constitutional:      General: She is not in acute distress.    Appearance: She is underweight. She is not diaphoretic.  Neck:     Thyroid: No thyromegaly.  Cardiovascular:     Rate and Rhythm: Normal rate and regular rhythm.     Pulses: Normal pulses.     Heart sounds: Normal heart sounds.  Pulmonary:     Effort: No respiratory distress.     Breath sounds: Normal breath sounds.  Abdominal:     General: Bowel sounds are normal. There is no distension.     Palpations: Abdomen is soft.     Tenderness: There is no abdominal tenderness.  Musculoskeletal:        General: Normal range of motion.     Cervical back: Neck supple.     Right lower leg: No edema.     Left lower leg: No edema.  Lymphadenopathy:     Cervical: No cervical adenopathy.  Skin:    General: Skin is warm and dry.  Neurological:     Mental Status: She is alert. Mental status is at baseline.  Psychiatric:        Mood and Affect: Mood normal.         ASSESSMENT/ PLAN:  TODAY  Pancreatic insufficiency: will continue questran 8 gm twice daily; creon 180,000 units with meals and imodium 2 mg three times daily   2. Macrocytic anemia: hgb 11.4; vitamin B 12: 620 is off iron due to diarrhea  3. Vascular dementia with behavioral disturbance: weight is 123 pounds; will continue buspar 20 mg three times daily for anxiety is off aricept.     PREVIOUS    4. Herpes: no recent outbreak; is on acyclovir 400 mg twice daily   5. Failure to thrive in adult: weight is stable at 123 pounds.   6. Hypokalemia: k+ 3.8 will continue k+ 20 meq daily   7. Hypothyroidism unspecified type:  tsh 3.485 is on synthroid 25 mcg daily   8. Hypertension associated with stage 3b chronic kidney disease due to type 2 diabetes mellitus: b/p 121/71 will continue cozaar 50 mg daily asa 81 mg daily   9. Gastrointestinal hemorrhage associated with anorectal source: is status post flex sig: internal hemorrhoids; no stigmata of bleeding present. Hgb stable.   10. Diabetes mellitus type 2 with stage 3 chronic kidney disease and hypertension: hgb A1c 5.4 is on arb and statin  11. Post menopausal osteoporosis: t score -3.758; will continue supplements  12. Thrombocytopenia: plt 76 will monitor  13. Multiple myeloma without achieving remission: is followed by oncology  14. Aortic atherosclerosis (ct 08-25-14) is on statin and asa  15. First degree av block/bradycardia: has been seen by cardiology: no further interventions     Synthia Innocent NP [facility]  call [phone]

## 2023-07-08 ENCOUNTER — Inpatient Hospital Stay: Payer: Medicare PPO

## 2023-07-08 DIAGNOSIS — Z5112 Encounter for antineoplastic immunotherapy: Secondary | ICD-10-CM | POA: Diagnosis not present

## 2023-07-08 DIAGNOSIS — C9000 Multiple myeloma not having achieved remission: Secondary | ICD-10-CM | POA: Diagnosis not present

## 2023-07-08 DIAGNOSIS — Z79899 Other long term (current) drug therapy: Secondary | ICD-10-CM | POA: Diagnosis not present

## 2023-07-08 LAB — CBC WITH DIFFERENTIAL/PLATELET
Abs Immature Granulocytes: 0.01 10*3/uL (ref 0.00–0.07)
Basophils Absolute: 0 10*3/uL (ref 0.0–0.1)
Basophils Relative: 0 %
Eosinophils Absolute: 0.2 10*3/uL (ref 0.0–0.5)
Eosinophils Relative: 3 %
HCT: 32.2 % — ABNORMAL LOW (ref 36.0–46.0)
Hemoglobin: 10.4 g/dL — ABNORMAL LOW (ref 12.0–15.0)
Immature Granulocytes: 0 %
Lymphocytes Relative: 11 %
Lymphs Abs: 0.5 10*3/uL — ABNORMAL LOW (ref 0.7–4.0)
MCH: 32.4 pg (ref 26.0–34.0)
MCHC: 32.3 g/dL (ref 30.0–36.0)
MCV: 100.3 fL — ABNORMAL HIGH (ref 80.0–100.0)
Monocytes Absolute: 0.5 10*3/uL (ref 0.1–1.0)
Monocytes Relative: 9 %
Neutro Abs: 3.9 10*3/uL (ref 1.7–7.7)
Neutrophils Relative %: 77 %
Platelets: 129 10*3/uL — ABNORMAL LOW (ref 150–400)
RBC: 3.21 MIL/uL — ABNORMAL LOW (ref 3.87–5.11)
RDW: 15.9 % — ABNORMAL HIGH (ref 11.5–15.5)
WBC: 5.1 10*3/uL (ref 4.0–10.5)
nRBC: 0 % (ref 0.0–0.2)

## 2023-07-08 LAB — COMPREHENSIVE METABOLIC PANEL
ALT: 18 U/L (ref 0–44)
AST: 29 U/L (ref 15–41)
Albumin: 3.3 g/dL — ABNORMAL LOW (ref 3.5–5.0)
Alkaline Phosphatase: 54 U/L (ref 38–126)
Anion gap: 7 (ref 5–15)
BUN: 29 mg/dL — ABNORMAL HIGH (ref 8–23)
CO2: 22 mmol/L (ref 22–32)
Calcium: 8.7 mg/dL — ABNORMAL LOW (ref 8.9–10.3)
Chloride: 110 mmol/L (ref 98–111)
Creatinine, Ser: 1.42 mg/dL — ABNORMAL HIGH (ref 0.44–1.00)
GFR, Estimated: 38 mL/min — ABNORMAL LOW (ref >60)
Glucose, Bld: 155 mg/dL — ABNORMAL HIGH (ref 70–99)
Potassium: 4.3 mmol/L (ref 3.5–5.1)
Sodium: 139 mmol/L (ref 135–145)
Total Bilirubin: 0.8 mg/dL (ref 0.3–1.2)
Total Protein: 6.7 g/dL (ref 6.5–8.1)

## 2023-07-08 LAB — MAGNESIUM: Magnesium: 2.3 mg/dL (ref 1.7–2.4)

## 2023-07-09 ENCOUNTER — Inpatient Hospital Stay: Payer: Medicare PPO | Admitting: Hematology

## 2023-07-09 ENCOUNTER — Inpatient Hospital Stay: Payer: Medicare PPO

## 2023-07-09 VITALS — BP 137/81 | HR 86 | Temp 97.2°F | Resp 18 | Wt 118.6 lb

## 2023-07-09 DIAGNOSIS — Z79899 Other long term (current) drug therapy: Secondary | ICD-10-CM | POA: Diagnosis not present

## 2023-07-09 DIAGNOSIS — Z5112 Encounter for antineoplastic immunotherapy: Secondary | ICD-10-CM | POA: Diagnosis not present

## 2023-07-09 DIAGNOSIS — C9000 Multiple myeloma not having achieved remission: Secondary | ICD-10-CM

## 2023-07-09 MED ORDER — BORTEZOMIB CHEMO SQ INJECTION 3.5 MG (2.5MG/ML)
1.3000 mg/m2 | Freq: Once | INTRAMUSCULAR | Status: AC
  Administered 2023-07-09: 2 mg via SUBCUTANEOUS
  Filled 2023-07-09: qty 0.8

## 2023-07-09 MED ORDER — DEXAMETHASONE 4 MG PO TABS
10.0000 mg | ORAL_TABLET | Freq: Once | ORAL | Status: AC
  Administered 2023-07-09: 10 mg via ORAL
  Filled 2023-07-09: qty 3

## 2023-07-09 NOTE — Progress Notes (Signed)
Patient tolerated Velcade injection with no complaints voiced. Lab work reviewed. See MAR for details. Injection site clean and dry with no bruising or swelling noted. Patient stable during and after injection. Band aid applied. VSS. Patient left in satisfactory condition with no s/s of distress noted. 

## 2023-07-09 NOTE — Patient Instructions (Signed)
[facility]  Discharge Instructions: Thank you for choosing [facility] to provide your oncology and hematology care.  If you have a lab appointment with the Cancer Center - please note that after April 8th, 2024, all labs will be drawn in the cancer center.  You do not have to check in or register with the main entrance as you have in the past but will complete your check-in in the cancer center.  Wear comfortable clothing and clothing appropriate for easy access to any Portacath or PICC line.   We strive to give you quality time with your provider. You may need to reschedule your appointment if you arrive late (15 or more minutes).  Arriving late affects you and other patients whose appointments are after yours.  Also, if you miss three or more appointments without notifying the office, you may be dismissed from the clinic at the provider's discretion.      For prescription refill requests, have your pharmacy contact our office and allow 72 hours for refills to be completed.    Today you received the following chemotherapy and/or immunotherapy agents velcade    To help prevent nausea and vomiting after your treatment, we encourage you to take your nausea medication as directed.  BELOW ARE SYMPTOMS THAT SHOULD BE REPORTED IMMEDIATELY: *FEVER GREATER THAN 100.4 F (38 C) OR HIGHER *CHILLS OR SWEATING *NAUSEA AND VOMITING THAT IS NOT CONTROLLED WITH YOUR NAUSEA MEDICATION *UNUSUAL SHORTNESS OF BREATH *UNUSUAL BRUISING OR BLEEDING *URINARY PROBLEMS (pain or burning when urinating, or frequent urination) *BOWEL PROBLEMS (unusual diarrhea, constipation, pain near the anus) TENDERNESS IN MOUTH AND THROAT WITH OR WITHOUT PRESENCE OF ULCERS (sore throat, sores in mouth, or a toothache) UNUSUAL RASH, SWELLING OR PAIN  UNUSUAL VAGINAL DISCHARGE OR ITCHING   Items with * indicate a potential emergency and should be followed up as soon as possible or go to the  Emergency Department if any problems should occur.  Please show the CHEMOTHERAPY ALERT CARD or IMMUNOTHERAPY ALERT CARD at check-in to the Emergency Department and triage nurse.  Should you have questions after your visit or need to cancel or reschedule your appointment, please contact [facility] [phone]  and follow the prompts.  Office hours are 8:00 a.m. to 4:30 p.m. Monday - Friday. Please note that voicemails left after 4:00 p.m. may not be returned until the following business day.  We are closed weekends and major holidays. You have access to a nurse at all times for urgent questions. Please call the main number to the clinic [phone] and follow the prompts.  For any non-urgent questions, you may also contact your provider using MyChart. We now offer e-Visits for anyone 18 and older to request care online for non-urgent symptoms. For details visit [email or web address].   Also download the MyChart app! Go to the app store, search "MyChart", open the app, select [facility], and log in with your MyChart username and password.   

## 2023-07-09 NOTE — Progress Notes (Signed)
Patient presents Velcade for chemotherapy infusion.  Patient is in satisfactory condition with no new complaints voiced.  Vital signs are stable.  Labs reviewed and all labs are within treatment parameters.  We will proceed with treatment per MD orders.

## 2023-07-13 ENCOUNTER — Non-Acute Institutional Stay: Payer: Self-pay | Admitting: Adult Health

## 2023-07-13 ENCOUNTER — Encounter: Payer: Self-pay | Admitting: Adult Health

## 2023-07-13 DIAGNOSIS — I700 Atherosclerosis of aorta: Secondary | ICD-10-CM

## 2023-07-13 DIAGNOSIS — F01518 Vascular dementia, unspecified severity, with other behavioral disturbance: Secondary | ICD-10-CM

## 2023-07-13 NOTE — Progress Notes (Unsigned)
Location:  [facility] Room Number: 148 Place of Service:  SNF (31)   CODE STATUS: dnr   Allergies  Allergen Reactions   Lotensin [Benazepril]     Chief Complaint  Patient presents with   Acute Visit    Change in status     HPI:  Staff is concerned about her overall health status. She has had recent increase in her zoloft to 50 mg daily. She is spending more time in bed. She requires assistance with her meals. She tells me that she feels "bad" but could not describe any further symptoms. There are no reports of fevers. Her weight is stable at 123 pounds. She denies any difficulty with sleep.   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HCC)    Dementia (HCC)    Diabetes mellitus with CKD    H/O partial nephrectomy    Hypokalemia    Hypothyroidism    Impaired cognition    Multiple myeloma (HCC)    Dr Kayleen Memos, [facility]    Past Surgical History:  Procedure Laterality Date   ABDOMINAL HYSTERECTOMY     CRANIOTOMY     for lymphoma   FLEXIBLE SIGMOIDOSCOPY N/A 04/03/2023   Procedure: FLEXIBLE SIGMOIDOSCOPY;  Surgeon: Dolores Frame, MD;  [facility]: [facility];  Service: Gastroenterology;  Laterality: N/A;   YAG LASER APPLICATION Right 07/02/2015   Procedure: YAG LASER APPLICATION;  Surgeon: Susa Simmonds, MD;  Location: [facility];  Service: Ophthalmology;  Laterality: Right;    Social History   Socioeconomic History   Marital status: Single    Spouse name: Not on file   Number of children: Not on file   Years of education: Not on file   Highest education level: Not on file  Occupational History   Occupation: retired   Tobacco Use   Smoking status: Never   Smokeless tobacco: Never  Vaping Use   Vaping status: Never Used  Substance and Sexual Activity   Alcohol use: No   Drug use: No   Sexual activity: Never  Other Topics Concern   Not on file  Social History  Narrative   Long term resident of [facility]    Social Determinants of Health   Financial Resource Strain: Low Risk  (10/16/2020)   Overall Financial Resource Strain (CARDIA)    Difficulty of Paying Living Expenses: Not very hard  Food Insecurity: No Food Insecurity (04/02/2023)   Hunger Vital Sign    Worried About Running Out of Food in the Last Year: Never true    Ran Out of Food in the Last Year: Never true  Transportation Needs: No Transportation Needs (04/02/2023)   PRAPARE - Administrator, Civil Service (Medical): No    Lack of Transportation (Non-Medical): No  Physical Activity: Inactive (10/16/2020)   Exercise Vital Sign    Days of Exercise per Week: 0 days    Minutes of Exercise per Session: 0 min  Stress: No Stress Concern Present (10/16/2020)   [employer] of Occupational Health - Occupational Stress Questionnaire    Feeling of Stress : Not at all  Social Connections: Moderately Isolated (10/16/2020)   Social Connection and Isolation Panel [NHANES]    Frequency of Communication with Friends and Family: More than three times a week    Frequency of Social Gatherings with Friends and Family: Once a week    Attends Religious Services:  More than 4 times per year    Active Member of Clubs or Organizations: No    Attends Banker Meetings: Never    Marital Status: Never married  Intimate Partner Violence: Not At Risk (04/02/2023)   Humiliation, Afraid, Rape, and Kick questionnaire    Fear of Current or Ex-Partner: No    Emotionally Abused: No    Physically Abused: No    Sexually Abused: No   Family History  Problem Relation Age of Onset   Depression Mother    Diabetes Mother    Heart disease Father    Cancer - Prostate Brother    Cancer Brother    Cancer Sister       VITAL SIGNS BP (!) 144/80   Pulse 78   Temp 97.6 F (36.4 C)   Resp 18   Ht 5\' 9"  (1.753 m)   Wt 123 lb 3.2 oz (55.9 kg)   SpO2 99%   BMI 18.19 kg/m   Outpatient Encounter  Medications as of 07/13/2023  Medication Sig   acyclovir (ZOVIRAX) 400 MG tablet TAKE 1 TABLET BY MOUTH TWICE DAILY   aspirin EC 81 MG tablet Take 81 mg by mouth daily.   busPIRone (BUSPAR) 10 MG tablet Take 20 mg by mouth 3 (three) times daily. For anxiety   Calcium Carbonate-Vit D-Min (CALCIUM 600+D PLUS MINERALS) 600-400 MG-UNIT CHEW Chew 1 tablet by mouth daily.   cholestyramine (QUESTRAN) 4 g packet Take 4 g by mouth 2 (two) times daily between meals.   LACTOBACILLUS PROBIOTIC PO Take 2 capsules by mouth daily.   levothyroxine (SYNTHROID, LEVOTHROID) 25 MCG tablet Take 25 mcg by mouth daily before breakfast.   lipase/protease/amylase (CREON) 36000 UNITS CPEP capsule 36,000-114,000- 180,000 unit; amt: 1; oral Special Instructions: Take one capsule with snacks if eaten between meals. Twice A Day Between Meals as needed   lipase/protease/amylase (CREON) 36000 UNITS CPEP capsule Take 3 capsules by mouth 3 (three) times daily with meals.   loperamide (IMODIUM A-D) 2 MG tablet Take 2 mg by mouth 3 (three) times daily. For diarrhea   losartan (COZAAR) 50 MG tablet Take 1 tablet (50 mg total) by mouth daily.   NON FORMULARY Dysphagia 2 diet with thin liquids   NON FORMULARY Wanderguard #2237 to ankle for safety awareness. Check placement and function qshift. Special Instructions: Check placement and function qshift. Every Shift Day, Evening, Night   potassium chloride (MICRO-K) 10 MEQ CR capsule Take by mouth 2 (two) times daily.   potassium chloride SA (KLOR-CON) 20 MEQ tablet Take 20 mEq by mouth daily.       SIGNIFICANT DIAGNOSTIC EXAMS  PREVIOUS   08-10-20: t score: -3.758  03-15-23: right hand x-ray:  On lateral view: minimal linear lucency in base of mid phalanx 3rd digit compatible with acute nondisplaced hairline fracture versus vascular channel artifact.  Moderate soft tissue swelling about PIP joint 3rd digit Mild osteopenia Mild degree of osteoarthritis   04-03-23: flex sig;  bleeding from internal hemorrhoids; self limited  NO NEW EXAMS       LABS REVIEWED PREVIOUS    06-25-22: wbc 4.2; hgb 10.7; hct 33.8; mcv 98.3 plt 110; glucose 97; bun 21; creat 1.10; k+ 3.6; na++ 140; ca 8.9; gfr 52; protein 6.4; albumin 3.3 08-06-22: wbc 4.4; hgb 11.0; hct 34.5; mcv 99.4 plt 130; glucose 138; bun 24; creat 1.38; k+ 4.1; na++ 141; ca 8.9; gfr 39; protein 6.5; albumin 3.2; mag 2.3 09-09-22: wbc 5.1; hgb 11.3; hct 34.5;  mcv 99.4 plt 132; glucose 119; bun 22; creat 1.29; k+ 4.1; na++ 143 ca 8.8; gfr 42; protein 6.6 albumin 3.2; mag 2.2   10-09-22: wbc 4.3; hgb 11.3; hct 35.0; mcv 101.7 plt 93; glucose 147; bun 21; creat 1.15; k+ 3.6; na++ 146; ca 9.0; gfr 49; protein 6.8; albumin 3.3  12-24-22: wbc 4.9; hgb 11.8; hct 36.1; mcv 101.4 plt 154; glucose 95; bun 21; creat 1.35; k+ 4.1; na++ 141; ca 9.1; gfr 40; mag 2.4; protein 6.7; albumin 3.5  01-19-23: hgb A1c; 5.6; micro/creat ratio: 6  01-21-23: wbc 4.5 hgb 11.7; hct 35.5; mcv 101.7 plt 132; glucose 110; bun 28;creat 1.59; k+ 4.1; na++ 140; ca 8.8; gfr 33; protein 6.7 albumin 3.4 mag 2.5 02-25-23; wbc 3.1; hgb 11.4; hct 34.5; mcv 101.5 plt 88; glucose 116; bun 24; creat 1.52; k+ 4.1; na++ 143; ca 8.7; gfr 35; protein 6.5; albumin 3.2 mag 2.3 04-03-23: wbc 4.1; hgb 10.7; hct 32.1; mcv 97.3 plt 66; glucose 73; bun 18; creat 1.00; k+ 3.7; na++ 140; ca 8.2 gfr 58 #2: wbc 4.1; hgb 11.4; hct 34.0; mcv 98.3 plt 69  05-27-23: wbc 4.6; hgb 12.3; hct 36.8; mcv 98.7 plt 70; glucose 142; bun 22; creat 1.16; k+ 3.7; na++ 139; ca 8.6; gfr 48; protein 6.8 albumin 3.4; mag 2.2  06-24-23: wbc 4.4; hgb 11.4; hct 34.5; mcv 97.7 plt 76; glucose 102; bun 27; creat 1.20; k+ 3.8; na++ 136; ca 8.9; gfr 46; protein 6.7 albumin 3.2; mag 2.3  NO NEW LABS    Review of Systems  Constitutional:  Negative for fever and malaise/fatigue.  Respiratory:  Negative for cough and shortness of breath.   Cardiovascular:  Negative for chest pain, palpitations and leg swelling.   Gastrointestinal:  Negative for abdominal pain, constipation and heartburn.  Musculoskeletal:  Negative for back pain, joint pain and myalgias.  Skin: Negative.   Neurological:  Negative for dizziness.  Psychiatric/Behavioral:  Negative for depression. The patient is not nervous/anxious and does not have insomnia.    Physical Exam Constitutional:      General: She is not in acute distress.    Appearance: She is underweight. She is not diaphoretic.  Neck:     Thyroid: No thyromegaly.  Cardiovascular:     Rate and Rhythm: Normal rate and regular rhythm.     Pulses: Normal pulses.     Heart sounds: Normal heart sounds.  Pulmonary:     Effort: Pulmonary effort is normal. No respiratory distress.     Breath sounds: Normal breath sounds.  Abdominal:     General: Bowel sounds are normal. There is no distension.     Palpations: Abdomen is soft.     Tenderness: There is no abdominal tenderness.  Musculoskeletal:        General: Normal range of motion.     Cervical back: Neck supple.     Right lower leg: No edema.     Left lower leg: No edema.  Lymphadenopathy:     Cervical: No cervical adenopathy.  Skin:    General: Skin is warm and dry.  Neurological:     Mental Status: She is alert. Mental status is at baseline.  Psychiatric:        Mood and Affect: Mood normal.       ASSESSMENT/ PLAN:  TODAY  Aortic atherosclerosis Vascular dementia with behavioral disturbance   Will lower zoloft to 25 mg daily; as she is not tolerating the higher dose.     Amanda Wu  Saralynn Langhorst NP [facility]   call [phone]

## 2023-07-15 ENCOUNTER — Inpatient Hospital Stay: Payer: Medicare PPO

## 2023-07-15 DIAGNOSIS — C9000 Multiple myeloma not having achieved remission: Secondary | ICD-10-CM | POA: Diagnosis not present

## 2023-07-15 DIAGNOSIS — Z79899 Other long term (current) drug therapy: Secondary | ICD-10-CM | POA: Diagnosis not present

## 2023-07-15 DIAGNOSIS — Z5112 Encounter for antineoplastic immunotherapy: Secondary | ICD-10-CM | POA: Diagnosis not present

## 2023-07-15 LAB — CBC WITH DIFFERENTIAL/PLATELET
Abs Immature Granulocytes: 0.01 10*3/uL (ref 0.00–0.07)
Basophils Absolute: 0 10*3/uL (ref 0.0–0.1)
Basophils Relative: 1 %
Eosinophils Absolute: 0.1 10*3/uL (ref 0.0–0.5)
Eosinophils Relative: 2 %
HCT: 34.7 % — ABNORMAL LOW (ref 36.0–46.0)
Hemoglobin: 11.4 g/dL — ABNORMAL LOW (ref 12.0–15.0)
Immature Granulocytes: 0 %
Lymphocytes Relative: 15 %
Lymphs Abs: 0.7 10*3/uL (ref 0.7–4.0)
MCH: 32 pg (ref 26.0–34.0)
MCHC: 32.9 g/dL (ref 30.0–36.0)
MCV: 97.5 fL (ref 80.0–100.0)
Monocytes Absolute: 0.5 10*3/uL (ref 0.1–1.0)
Monocytes Relative: 11 %
Neutro Abs: 3.1 10*3/uL (ref 1.7–7.7)
Neutrophils Relative %: 71 %
Platelets: 97 10*3/uL — ABNORMAL LOW (ref 150–400)
RBC: 3.56 MIL/uL — ABNORMAL LOW (ref 3.87–5.11)
RDW: 15.6 % — ABNORMAL HIGH (ref 11.5–15.5)
WBC: 4.3 10*3/uL (ref 4.0–10.5)
nRBC: 0 % (ref 0.0–0.2)

## 2023-07-15 LAB — COMPREHENSIVE METABOLIC PANEL
ALT: 18 U/L (ref 0–44)
AST: 30 U/L (ref 15–41)
Albumin: 3.5 g/dL (ref 3.5–5.0)
Alkaline Phosphatase: 56 U/L (ref 38–126)
Anion gap: 9 (ref 5–15)
BUN: 28 mg/dL — ABNORMAL HIGH (ref 8–23)
CO2: 23 mmol/L (ref 22–32)
Calcium: 9 mg/dL (ref 8.9–10.3)
Chloride: 108 mmol/L (ref 98–111)
Creatinine, Ser: 1.53 mg/dL — ABNORMAL HIGH (ref 0.44–1.00)
GFR, Estimated: 35 mL/min — ABNORMAL LOW (ref >60)
Glucose, Bld: 106 mg/dL — ABNORMAL HIGH (ref 70–99)
Potassium: 4.1 mmol/L (ref 3.5–5.1)
Sodium: 140 mmol/L (ref 135–145)
Total Bilirubin: 0.8 mg/dL (ref 0.3–1.2)
Total Protein: 6.7 g/dL (ref 6.5–8.1)

## 2023-07-15 LAB — MAGNESIUM: Magnesium: 2.4 mg/dL (ref 1.7–2.4)

## 2023-07-16 ENCOUNTER — Inpatient Hospital Stay: Payer: Medicare PPO | Attending: Hematology

## 2023-07-16 VITALS — BP 126/78 | HR 84 | Temp 98.0°F | Resp 18

## 2023-07-16 DIAGNOSIS — C9000 Multiple myeloma not having achieved remission: Secondary | ICD-10-CM | POA: Diagnosis not present

## 2023-07-16 DIAGNOSIS — Z5112 Encounter for antineoplastic immunotherapy: Secondary | ICD-10-CM | POA: Diagnosis not present

## 2023-07-16 MED ORDER — DEXAMETHASONE 4 MG PO TABS
10.0000 mg | ORAL_TABLET | Freq: Once | ORAL | Status: AC
  Administered 2023-07-16: 10 mg via ORAL
  Filled 2023-07-16: qty 3

## 2023-07-16 MED ORDER — BORTEZOMIB CHEMO SQ INJECTION 3.5 MG (2.5MG/ML)
1.3000 mg/m2 | Freq: Once | INTRAMUSCULAR | Status: AC
  Administered 2023-07-16: 2 mg via SUBCUTANEOUS
  Filled 2023-07-16: qty 0.8

## 2023-07-16 NOTE — Progress Notes (Signed)
Treatment given per orders. Patient tolerated it well without problems. Vitals stable and discharged home from clinic via wheelchair Follow up as scheduled.  

## 2023-07-16 NOTE — Progress Notes (Signed)
Ok to proceed with creatinine 1.53  Dr Trenton Gammon, PharmD

## 2023-07-17 DIAGNOSIS — R488 Other symbolic dysfunctions: Secondary | ICD-10-CM | POA: Diagnosis not present

## 2023-07-17 DIAGNOSIS — F0390 Unspecified dementia without behavioral disturbance: Secondary | ICD-10-CM | POA: Diagnosis not present

## 2023-07-17 DIAGNOSIS — R1312 Dysphagia, oropharyngeal phase: Secondary | ICD-10-CM | POA: Diagnosis not present

## 2023-07-20 DIAGNOSIS — R1312 Dysphagia, oropharyngeal phase: Secondary | ICD-10-CM | POA: Diagnosis not present

## 2023-07-20 DIAGNOSIS — R488 Other symbolic dysfunctions: Secondary | ICD-10-CM | POA: Diagnosis not present

## 2023-07-20 DIAGNOSIS — F0390 Unspecified dementia without behavioral disturbance: Secondary | ICD-10-CM | POA: Diagnosis not present

## 2023-07-21 DIAGNOSIS — R488 Other symbolic dysfunctions: Secondary | ICD-10-CM | POA: Diagnosis not present

## 2023-07-21 DIAGNOSIS — F0390 Unspecified dementia without behavioral disturbance: Secondary | ICD-10-CM | POA: Diagnosis not present

## 2023-07-21 DIAGNOSIS — R1312 Dysphagia, oropharyngeal phase: Secondary | ICD-10-CM | POA: Diagnosis not present

## 2023-07-22 ENCOUNTER — Inpatient Hospital Stay: Payer: Medicare PPO

## 2023-07-22 DIAGNOSIS — C9000 Multiple myeloma not having achieved remission: Secondary | ICD-10-CM

## 2023-07-22 DIAGNOSIS — R488 Other symbolic dysfunctions: Secondary | ICD-10-CM | POA: Diagnosis not present

## 2023-07-22 DIAGNOSIS — Z5112 Encounter for antineoplastic immunotherapy: Secondary | ICD-10-CM | POA: Diagnosis not present

## 2023-07-22 DIAGNOSIS — R1312 Dysphagia, oropharyngeal phase: Secondary | ICD-10-CM | POA: Diagnosis not present

## 2023-07-22 DIAGNOSIS — F0390 Unspecified dementia without behavioral disturbance: Secondary | ICD-10-CM | POA: Diagnosis not present

## 2023-07-22 LAB — COMPREHENSIVE METABOLIC PANEL
ALT: 17 U/L (ref 0–44)
AST: 29 U/L (ref 15–41)
Albumin: 3.1 g/dL — ABNORMAL LOW (ref 3.5–5.0)
Alkaline Phosphatase: 49 U/L (ref 38–126)
Anion gap: 9 (ref 5–15)
BUN: 33 mg/dL — ABNORMAL HIGH (ref 8–23)
CO2: 21 mmol/L — ABNORMAL LOW (ref 22–32)
Calcium: 9 mg/dL (ref 8.9–10.3)
Chloride: 116 mmol/L — ABNORMAL HIGH (ref 98–111)
Creatinine, Ser: 1.22 mg/dL — ABNORMAL HIGH (ref 0.44–1.00)
GFR, Estimated: 45 mL/min — ABNORMAL LOW (ref >60)
Glucose, Bld: 153 mg/dL — ABNORMAL HIGH (ref 70–99)
Potassium: 4.2 mmol/L (ref 3.5–5.1)
Sodium: 146 mmol/L — ABNORMAL HIGH (ref 135–145)
Total Bilirubin: 0.6 mg/dL (ref 0.3–1.2)
Total Protein: 6.2 g/dL — ABNORMAL LOW (ref 6.5–8.1)

## 2023-07-22 LAB — CBC WITH DIFFERENTIAL/PLATELET
Abs Immature Granulocytes: 0.01 10*3/uL (ref 0.00–0.07)
Basophils Absolute: 0 10*3/uL (ref 0.0–0.1)
Basophils Relative: 0 %
Eosinophils Absolute: 0.1 10*3/uL (ref 0.0–0.5)
Eosinophils Relative: 3 %
HCT: 34.6 % — ABNORMAL LOW (ref 36.0–46.0)
Hemoglobin: 11 g/dL — ABNORMAL LOW (ref 12.0–15.0)
Immature Granulocytes: 0 %
Lymphocytes Relative: 12 %
Lymphs Abs: 0.5 10*3/uL — ABNORMAL LOW (ref 0.7–4.0)
MCH: 31.7 pg (ref 26.0–34.0)
MCHC: 31.8 g/dL (ref 30.0–36.0)
MCV: 99.7 fL (ref 80.0–100.0)
Monocytes Absolute: 0.3 10*3/uL (ref 0.1–1.0)
Monocytes Relative: 8 %
Neutro Abs: 3.3 10*3/uL (ref 1.7–7.7)
Neutrophils Relative %: 77 %
Platelets: 57 10*3/uL — ABNORMAL LOW (ref 150–400)
RBC: 3.47 MIL/uL — ABNORMAL LOW (ref 3.87–5.11)
RDW: 16.2 % — ABNORMAL HIGH (ref 11.5–15.5)
WBC: 4.2 10*3/uL (ref 4.0–10.5)
nRBC: 0 % (ref 0.0–0.2)

## 2023-07-22 LAB — MAGNESIUM: Magnesium: 2.5 mg/dL — ABNORMAL HIGH (ref 1.7–2.4)

## 2023-07-23 ENCOUNTER — Inpatient Hospital Stay: Payer: Medicare PPO

## 2023-07-23 DIAGNOSIS — F0390 Unspecified dementia without behavioral disturbance: Secondary | ICD-10-CM | POA: Diagnosis not present

## 2023-07-23 DIAGNOSIS — R1312 Dysphagia, oropharyngeal phase: Secondary | ICD-10-CM | POA: Diagnosis not present

## 2023-07-23 DIAGNOSIS — R488 Other symbolic dysfunctions: Secondary | ICD-10-CM | POA: Diagnosis not present

## 2023-07-24 DIAGNOSIS — R488 Other symbolic dysfunctions: Secondary | ICD-10-CM | POA: Diagnosis not present

## 2023-07-24 DIAGNOSIS — R1312 Dysphagia, oropharyngeal phase: Secondary | ICD-10-CM | POA: Diagnosis not present

## 2023-07-24 DIAGNOSIS — F0390 Unspecified dementia without behavioral disturbance: Secondary | ICD-10-CM | POA: Diagnosis not present

## 2023-07-27 DIAGNOSIS — R1312 Dysphagia, oropharyngeal phase: Secondary | ICD-10-CM | POA: Diagnosis not present

## 2023-07-27 DIAGNOSIS — R488 Other symbolic dysfunctions: Secondary | ICD-10-CM | POA: Diagnosis not present

## 2023-07-27 DIAGNOSIS — F0390 Unspecified dementia without behavioral disturbance: Secondary | ICD-10-CM | POA: Diagnosis not present

## 2023-07-28 DIAGNOSIS — F0390 Unspecified dementia without behavioral disturbance: Secondary | ICD-10-CM | POA: Diagnosis not present

## 2023-07-28 DIAGNOSIS — R488 Other symbolic dysfunctions: Secondary | ICD-10-CM | POA: Diagnosis not present

## 2023-07-28 DIAGNOSIS — R1312 Dysphagia, oropharyngeal phase: Secondary | ICD-10-CM | POA: Diagnosis not present

## 2023-07-29 ENCOUNTER — Encounter: Payer: Self-pay | Admitting: Adult Health

## 2023-07-29 ENCOUNTER — Non-Acute Institutional Stay: Payer: Self-pay | Admitting: Adult Health

## 2023-07-29 DIAGNOSIS — R488 Other symbolic dysfunctions: Secondary | ICD-10-CM | POA: Diagnosis not present

## 2023-07-29 DIAGNOSIS — F0390 Unspecified dementia without behavioral disturbance: Secondary | ICD-10-CM | POA: Diagnosis not present

## 2023-07-29 DIAGNOSIS — F339 Major depressive disorder, recurrent, unspecified: Secondary | ICD-10-CM | POA: Diagnosis not present

## 2023-07-29 DIAGNOSIS — R1312 Dysphagia, oropharyngeal phase: Secondary | ICD-10-CM | POA: Diagnosis not present

## 2023-07-29 NOTE — Progress Notes (Addendum)
Location:  [facility] Room Number: 148 Place of Service:  SNF (31)   CODE STATUS: dnr   Allergies  Allergen Reactions   Lotensin [Benazepril]     Chief Complaint  Patient presents with   Acute Visit    Medical review     HPI:  She is having increased difficulty taking medications. She is spending more time in bed; she continues to lose weight due to her decline in her status.  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HCC)    Dementia (HCC)    Diabetes mellitus with CKD    H/O partial nephrectomy    Hypokalemia    Hypothyroidism    Impaired cognition    Multiple myeloma (HCC)    Dr Kayleen Memos, [facility]    Past Surgical History:  Procedure Laterality Date   ABDOMINAL HYSTERECTOMY     CRANIOTOMY     for lymphoma   FLEXIBLE SIGMOIDOSCOPY N/A 04/03/2023   Procedure: FLEXIBLE SIGMOIDOSCOPY;  Surgeon: Dolores Frame, MD;  Location[facility];  Service: Gastroenterology;  Laterality: N/A;   YAG LASER APPLICATION Right 07/02/2015   Procedure: YAG LASER APPLICATION;  Surgeon: Susa Simmonds, MD;  Location: [facility];  Service: Ophthalmology;  Laterality: Right;    Social History   Socioeconomic History   Marital status: Single    Spouse name: Not on file   Number of children: Not on file   Years of education: Not on file   Highest education level: Not on file  Occupational History   Occupation: retired   Tobacco Use   Smoking status: Never   Smokeless tobacco: Never  Vaping Use   Vaping status: Never Used  Substance and Sexual Activity   Alcohol use: No   Drug use: No   Sexual activity: Never  Other Topics Concern   Not on file  Social History Narrative   Long term resident of [facility]    Social Determinants of Health   Financial Resource Strain: Low Risk  (10/16/2020)   Overall Financial Resource Strain (CARDIA)    Difficulty of Paying Living Expenses: Not very  hard  Food Insecurity: No Food Insecurity (04/02/2023)   Hunger Vital Sign    Worried About Running Out of Food in the Last Year: Never true    Ran Out of Food in the Last Year: Never true  Transportation Needs: No Transportation Needs (04/02/2023)   PRAPARE - Administrator, Civil Service (Medical): No    Lack of Transportation (Non-Medical): No  Physical Activity: Inactive (10/16/2020)   Exercise Vital Sign    Days of Exercise per Week: 0 days    Minutes of Exercise per Session: 0 min  Stress: No Stress Concern Present (10/16/2020)   [employer] of Occupational Health - Occupational Stress Questionnaire    Feeling of Stress : Not at all  Social Connections: Moderately Isolated (10/16/2020)   Social Connection and Isolation Panel [NHANES]    Frequency of Communication with Friends and Family: More than three times a week    Frequency of Social Gatherings with Friends and Family: Once a week    Attends Religious Services: More than 4 times per year    Active Member of [employer] or Organizations: No    Attends Banker Meetings: Never    Marital Status: Never married  Intimate Partner Violence: Not At Risk (04/02/2023)  Humiliation, Afraid, Rape, and Kick questionnaire    Fear of Current or Ex-Partner: No    Emotionally Abused: No    Physically Abused: No    Sexually Abused: No   Family History  Problem Relation Age of Onset   Depression Mother    Diabetes Mother    Heart disease Father    Cancer - Prostate Brother    Cancer Brother    Cancer Sister       VITAL SIGNS BP (!) 153/80   Pulse 80   Temp 98.2 F (36.8 C)   Resp 20   Ht 5\' 9"  (1.753 m)   Wt 111 lb 12.8 oz (50.7 kg)   SpO2 98%   BMI 16.51 kg/m   Outpatient Encounter Medications as of 07/29/2023  Medication Sig   acyclovir (ZOVIRAX) 400 MG tablet TAKE 1 TABLET BY MOUTH TWICE DAILY   aspirin EC 81 MG tablet Take 81 mg by mouth daily.   busPIRone (BUSPAR) 10 MG tablet Take 20 mg by  mouth 3 (three) times daily. For anxiety   Calcium Carbonate-Vit D-Min (CALCIUM 600+D PLUS MINERALS) 600-400 MG-UNIT CHEW Chew 1 tablet by mouth daily.   cholestyramine (QUESTRAN) 4 g packet Take 4 g by mouth 2 (two) times daily between meals.   LACTOBACILLUS PROBIOTIC PO Take 2 capsules by mouth daily.   levothyroxine (SYNTHROID, LEVOTHROID) 25 MCG tablet Take 25 mcg by mouth daily before breakfast.   lipase/protease/amylase (CREON) 36000 UNITS CPEP capsule 36,000-114,000- 180,000 unit; amt: 1; oral Special Instructions: Take one capsule with snacks if eaten between meals. Twice A Day Between Meals as needed   lipase/protease/amylase (CREON) 36000 UNITS CPEP capsule Take 3 capsules by mouth 3 (three) times daily with meals.   loperamide (IMODIUM A-D) 2 MG tablet Take 2 mg by mouth 3 (three) times daily. For diarrhea   losartan (COZAAR) 50 MG tablet Take 1 tablet (50 mg total) by mouth daily.   NON FORMULARY Dysphagia 2 diet with thin liquids   NON FORMULARY Wanderguard #2237 to ankle for safety awareness. Check placement and function qshift. Special Instructions: Check placement and function qshift. Every Shift Day, Evening, Night   potassium chloride (MICRO-K) 10 MEQ CR capsule Take by mouth 2 (two) times daily.   potassium chloride SA (KLOR-CON) 20 MEQ tablet Take 20 mEq by mouth daily.    sertraline (ZOLOFT) 25 MG tablet Take 25 mg by mouth daily.   Facility-Administered Encounter Medications as of 07/29/2023  Medication   cyanocobalamin ((VITAMIN B-12)) 1000 MCG/ML injection   cyanocobalamin ((VITAMIN B-12)) 1000 MCG/ML injection   denosumab (XGEVA) 120 MG/1.7ML injection   dexamethasone (DECADRON) 4 MG tablet   dexamethasone (DECADRON) 4 MG tablet   diphenhydrAMINE (BENADRYL) 50 MG/ML injection   diphenhydrAMINE (BENADRYL) 50 MG/ML injection   diphenhydrAMINE (BENADRYL) 50 MG/ML injection   epoetin alfa-epbx (RETACRIT) 78295 UNIT/ML injection   epoetin alfa-epbx (RETACRIT) 62130  UNIT/ML injection   epoetin alfa-epbx (RETACRIT) 86578 UNIT/ML injection   fulvestrant (FASLODEX) 250 MG/5ML injection   lanreotide acetate (SOMATULINE DEPOT) 120 MG/0.5ML injection   lanreotide acetate (SOMATULINE DEPOT) 120 MG/0.5ML injection   lanreotide acetate (SOMATULINE DEPOT) 120 MG/0.5ML injection   lanreotide acetate (SOMATULINE DEPOT) 120 MG/0.5ML injection   lidocaine (PF) (XYLOCAINE) 1 % injection   magnesium sulfate 2 GM/50ML IVPB   octreotide (SANDOSTATIN LAR) 30 MG IM injection   octreotide (SANDOSTATIN LAR) 30 MG IM injection   palonosetron (ALOXI) 0.25 MG/5ML injection   palonosetron (ALOXI) 0.25 MG/5ML injection  palonosetron (ALOXI) 0.25 MG/5ML injection   palonosetron (ALOXI) 0.25 MG/5ML injection   potassium chloride 10 MEQ/100ML IVPB   potassium chloride SA (KLOR-CON M) 20 MEQ CR tablet   potassium chloride SA (KLOR-CON M) 20 MEQ CR tablet   prochlorperazine (COMPAZINE) 10 MG tablet   prochlorperazine (COMPAZINE) 10 MG tablet     SIGNIFICANT DIAGNOSTIC EXAMS  PREVIOUS   08-10-20: t score: -3.758  03-15-23: right hand x-ray:  On lateral view: minimal linear lucency in base of mid phalanx 3rd digit compatible with acute nondisplaced hairline fracture versus vascular channel artifact.  Moderate soft tissue swelling about PIP joint 3rd digit Mild osteopenia Mild degree of osteoarthritis   04-03-23: flex sig; bleeding from internal hemorrhoids; self limited  NO NEW EXAMS       LABS REVIEWED PREVIOUS    08-06-22: wbc 4.4; hgb 11.0; hct 34.5; mcv 99.4 plt 130; glucose 138; bun 24; creat 1.38; k+ 4.1; na++ 141; ca 8.9; gfr 39; protein 6.5; albumin 3.2; mag 2.3 09-09-22: wbc 5.1; hgb 11.3; hct 34.5; mcv 99.4 plt 132; glucose 119; bun 22; creat 1.29; k+ 4.1; na++ 143 ca 8.8; gfr 42; protein 6.6 albumin 3.2; mag 2.2   10-09-22: wbc 4.3; hgb 11.3; hct 35.0; mcv 101.7 plt 93; glucose 147; bun 21; creat 1.15; k+ 3.6; na++ 146; ca 9.0; gfr 49; protein 6.8; albumin 3.3   12-24-22: wbc 4.9; hgb 11.8; hct 36.1; mcv 101.4 plt 154; glucose 95; bun 21; creat 1.35; k+ 4.1; na++ 141; ca 9.1; gfr 40; mag 2.4; protein 6.7; albumin 3.5  01-19-23: hgb A1c; 5.6; micro/creat ratio: 6  01-21-23: wbc 4.5 hgb 11.7; hct 35.5; mcv 101.7 plt 132; glucose 110; bun 28;creat 1.59; k+ 4.1; na++ 140; ca 8.8; gfr 33; protein 6.7 albumin 3.4 mag 2.5 02-25-23; wbc 3.1; hgb 11.4; hct 34.5; mcv 101.5 plt 88; glucose 116; bun 24; creat 1.52; k+ 4.1; na++ 143; ca 8.7; gfr 35; protein 6.5; albumin 3.2 mag 2.3 04-03-23: wbc 4.1; hgb 10.7; hct 32.1; mcv 97.3 plt 66; glucose 73; bun 18; creat 1.00; k+ 3.7; na++ 140; ca 8.2 gfr 58 #2: wbc 4.1; hgb 11.4; hct 34.0; mcv 98.3 plt 69  05-27-23: wbc 4.6; hgb 12.3; hct 36.8; mcv 98.7 plt 70; glucose 142; bun 22; creat 1.16; k+ 3.7; na++ 139; ca 8.6; gfr 48; protein 6.8 albumin 3.4; mag 2.2  06-24-23: wbc 4.4; hgb 11.4; hct 34.5; mcv 97.7 plt 76; glucose 102; bun 27; creat 1.20; k+ 3.8; na++ 136; ca 8.9; gfr 46; protein 6.7 albumin 3.2; mag 2.3  NO NEW LABS    Review of Systems  Unable to perform ROS: Dementia   Physical Exam Constitutional:      General: She is not in acute distress.    Appearance: She is well-developed. She is not diaphoretic.  Neck:     Thyroid: No thyromegaly.  Cardiovascular:     Rate and Rhythm: Normal rate and regular rhythm.     Pulses: Normal pulses.     Heart sounds: Normal heart sounds.  Pulmonary:     Effort: Pulmonary effort is normal. No respiratory distress.     Breath sounds: Normal breath sounds.  Abdominal:     General: Bowel sounds are normal. There is no distension.     Palpations: Abdomen is soft.     Tenderness: There is no abdominal tenderness.  Musculoskeletal:        General: Normal range of motion.     Cervical back: Neck supple.  Right lower leg: No edema.     Left lower leg: No edema.  Lymphadenopathy:     Cervical: No cervical adenopathy.  Skin:    General: Skin is warm and dry.  Neurological:      Mental Status: She is alert. Mental status is at baseline.  Psychiatric:        Mood and Affect: Mood normal.       ASSESSMENT/ PLAN:  TODAY   Vascular dementia with behavioral disturbance  Major depression recurrent and chronic   Will stop the following medications: acyclovir; asa; calcium; probiotic; imodium           Synthia Innocent NP [facility]  call [phone]

## 2023-07-30 DIAGNOSIS — F0390 Unspecified dementia without behavioral disturbance: Secondary | ICD-10-CM | POA: Diagnosis not present

## 2023-07-30 DIAGNOSIS — R488 Other symbolic dysfunctions: Secondary | ICD-10-CM | POA: Diagnosis not present

## 2023-07-30 DIAGNOSIS — R1312 Dysphagia, oropharyngeal phase: Secondary | ICD-10-CM | POA: Diagnosis not present

## 2023-07-31 DIAGNOSIS — F0390 Unspecified dementia without behavioral disturbance: Secondary | ICD-10-CM | POA: Diagnosis not present

## 2023-07-31 DIAGNOSIS — R1312 Dysphagia, oropharyngeal phase: Secondary | ICD-10-CM | POA: Diagnosis not present

## 2023-07-31 DIAGNOSIS — R488 Other symbolic dysfunctions: Secondary | ICD-10-CM | POA: Diagnosis not present

## 2023-08-03 DIAGNOSIS — R1312 Dysphagia, oropharyngeal phase: Secondary | ICD-10-CM | POA: Diagnosis not present

## 2023-08-03 DIAGNOSIS — R488 Other symbolic dysfunctions: Secondary | ICD-10-CM | POA: Diagnosis not present

## 2023-08-03 DIAGNOSIS — F0390 Unspecified dementia without behavioral disturbance: Secondary | ICD-10-CM | POA: Diagnosis not present

## 2023-08-04 DIAGNOSIS — R488 Other symbolic dysfunctions: Secondary | ICD-10-CM | POA: Diagnosis not present

## 2023-08-04 DIAGNOSIS — R1312 Dysphagia, oropharyngeal phase: Secondary | ICD-10-CM | POA: Diagnosis not present

## 2023-08-04 DIAGNOSIS — F0390 Unspecified dementia without behavioral disturbance: Secondary | ICD-10-CM | POA: Diagnosis not present

## 2023-08-05 ENCOUNTER — Inpatient Hospital Stay: Payer: Medicare PPO

## 2023-08-05 DIAGNOSIS — R1312 Dysphagia, oropharyngeal phase: Secondary | ICD-10-CM | POA: Diagnosis not present

## 2023-08-05 DIAGNOSIS — F0390 Unspecified dementia without behavioral disturbance: Secondary | ICD-10-CM | POA: Diagnosis not present

## 2023-08-05 DIAGNOSIS — R488 Other symbolic dysfunctions: Secondary | ICD-10-CM | POA: Diagnosis not present

## 2023-08-06 ENCOUNTER — Inpatient Hospital Stay: Payer: Medicare PPO

## 2023-08-06 DIAGNOSIS — R488 Other symbolic dysfunctions: Secondary | ICD-10-CM | POA: Diagnosis not present

## 2023-08-06 DIAGNOSIS — F0390 Unspecified dementia without behavioral disturbance: Secondary | ICD-10-CM | POA: Diagnosis not present

## 2023-08-06 DIAGNOSIS — R1312 Dysphagia, oropharyngeal phase: Secondary | ICD-10-CM | POA: Diagnosis not present

## 2023-08-07 DIAGNOSIS — R488 Other symbolic dysfunctions: Secondary | ICD-10-CM | POA: Diagnosis not present

## 2023-08-07 DIAGNOSIS — F0390 Unspecified dementia without behavioral disturbance: Secondary | ICD-10-CM | POA: Diagnosis not present

## 2023-08-07 DIAGNOSIS — R1312 Dysphagia, oropharyngeal phase: Secondary | ICD-10-CM | POA: Diagnosis not present

## 2023-08-10 ENCOUNTER — Non-Acute Institutional Stay (SKILLED_NURSING_FACILITY): Payer: Medicare PPO | Admitting: Internal Medicine

## 2023-08-10 ENCOUNTER — Encounter: Payer: Self-pay | Admitting: Internal Medicine

## 2023-08-10 DIAGNOSIS — F01518 Vascular dementia, unspecified severity, with other behavioral disturbance: Secondary | ICD-10-CM

## 2023-08-10 DIAGNOSIS — R627 Adult failure to thrive: Secondary | ICD-10-CM

## 2023-08-10 DIAGNOSIS — D696 Thrombocytopenia, unspecified: Secondary | ICD-10-CM | POA: Diagnosis not present

## 2023-08-10 DIAGNOSIS — F0390 Unspecified dementia without behavioral disturbance: Secondary | ICD-10-CM | POA: Diagnosis not present

## 2023-08-10 DIAGNOSIS — R488 Other symbolic dysfunctions: Secondary | ICD-10-CM | POA: Diagnosis not present

## 2023-08-10 DIAGNOSIS — R1312 Dysphagia, oropharyngeal phase: Secondary | ICD-10-CM | POA: Diagnosis not present

## 2023-08-10 NOTE — Progress Notes (Unsigned)
NURSING HOME LOCATION:  [facility] ROOM NUMBER:  [id number]  CODE STATUS:  DNR/ Comfort Cat=re   PCP:  Synthia Innocent NP  This is a nursing facility follow up visit of chronic medical diagnoses & to document compliance with Regulation 483.30 (c) in [employer] Survey Manual Phase 2 which mandates caregiver visit ( visits can alternate among physician, PA or NP as per statutes) within 10 days of 30 days / 60 days/ 90 days post admission to SNF date    Interim medical record and care since last SNF visit was updated with review of diagnostic studies and change in clinical status since last visit were documented.  HPI: She is a permanent resident of this facility with medical diagnoses of essential hypertension, history of CNS lymphoma, diabetes with CKD, hypothyroidism, chronic pancreatic insufficiency, history of multiple myeloma, and dementia. Labs are current as of 07/22/2023.  Creatinine is improved to 1.22 from a prior value of 1.53.  Prior GFR was 35; current value is 45 indicating borderline CKD stage B/A.  Protein/caloric malnutrition is present with the albumin of 3.1 and total protein is 6.2.  She has chronic thrombocytopenia with platelet counts ranging from a current value of 57,000 up to high of 129,000.  Her normochromic, normocytic anemia is essentially stable with current H/H of 11.0/34.6.  Review of systems: Dementia invalidated responses.  She can provide no meaningful history.  In fact she was almost nonverbal.  She denied having diarrhea. She has been made comfort care because of her advanced comorbidities.  She has had an exacerbation of her diarrhea in the setting of pancreatic insufficiency.  She has had difficulty complying with the Questran and Imodium has been initiated.  Constitutional: No fever, significant weight change, fatigue  Eyes: No redness, discharge, pain, vision change ENT/mouth: No nasal congestion,  purulent discharge, earache, change in  hearing, sore throat  Cardiovascular: No chest pain, palpitations, paroxysmal nocturnal dyspnea, claudication, edema  Respiratory: No cough, sputum production, hemoptysis, DOE, significant snoring, apnea   Gastrointestinal: No heartburn, dysphagia, abdominal pain, nausea /vomiting, rectal bleeding, melena, change in bowels Genitourinary: No dysuria, hematuria, pyuria, incontinence, nocturia Musculoskeletal: No joint stiffness, joint swelling, weakness, pain Dermatologic: No rash, pruritus, change in appearance of skin Neurologic: No dizziness, headache, syncope, seizures, numbness, tingling Psychiatric: No significant anxiety, depression, insomnia, anorexia Endocrine: No change in hair/skin/nails, excessive thirst, excessive hunger, excessive urination  Hematologic/lymphatic: No significant bruising, lymphadenopathy, abnormal bleeding Allergy/immunology: No itchy/watery eyes, significant sneezing, urticaria, angioedema  Physical exam:  Pertinent or positive findings: It was almost 2:00 and she was still in bed asleep in the right lateral decubitus position.  She was uncovered and wearing only an adult diaper.  She did open her eyes but did not look at the examiner or turn towars me.  In fact there was no voluntary muscular activity.  I had to move her arms which were flexed across her chest to hospitalist and heart.  Heart sounds are markedly distant.  Breath sounds are decreased.  Bowel sounds are hyperactive.  Dorsalis pedis pulses are stronger than posterior tibial pulses.  Limbs were grossly atrophic.  General appearance: Adequately nourished; no acute distress, increased work of breathing is present.   Lymphatic: No lymphadenopathy about the head, neck, axilla. Eyes: No conjunctival inflammation or lid edema is present. There is no scleral icterus. Ears:  External ear exam shows no significant lesions or deformities.   Nose:  External nasal examination shows no deformity or  inflammation.  Nasal mucosa are pink and moist without lesions, exudates Oral exam:  Lips and gums are healthy appearing. There is no oropharyngeal erythema or exudate. Neck:  No thyromegaly, masses, tenderness noted.    Heart:  Normal rate and regular rhythm. S1 and S2 normal without gallop, murmur, click, rub .  Lungs: Chest clear to auscultation without wheezes, rhonchi, rales, rubs. Abdomen: Bowel sounds are normal. Abdomen is soft and nontender with no organomegaly, hernias, masses. GU: Deferred  Extremities:  No cyanosis, clubbing, edema  Neurologic exam : Cn 2-7 intact Strength equal  in upper & lower extremities Balance, Rhomberg, finger to nose testing could not be completed due to clinical state Deep tendon reflexes are equal Skin: Warm & dry w/o tenting. No significant lesions or rash.  See summary under each active problem in the Problem List with associated updated therapeutic plan

## 2023-08-10 NOTE — Assessment & Plan Note (Signed)
She is having difficulty with medical compliance, especially the Questran.  Diarrhea in the context of chronic pancreatic insufficiency has exacerbated.  Imodium has been initiated.  She has been made comfort care.

## 2023-08-10 NOTE — Patient Instructions (Signed)
See assessment and plan under each diagnosis in the problem list and acutely for this visit 

## 2023-08-10 NOTE — Assessment & Plan Note (Signed)
Current platelet count is down to 57,000; no bleeding dyscrasias reported by staff. She is not on anticoagulants or other medications which would increase bleeding risk. Continue to monitor.

## 2023-08-10 NOTE — Assessment & Plan Note (Signed)
She can provide no meaningful history and has difficulty following commands.  She has been made comfort care.

## 2023-08-11 DIAGNOSIS — R488 Other symbolic dysfunctions: Secondary | ICD-10-CM | POA: Diagnosis not present

## 2023-08-11 DIAGNOSIS — R1312 Dysphagia, oropharyngeal phase: Secondary | ICD-10-CM | POA: Diagnosis not present

## 2023-08-11 DIAGNOSIS — F0390 Unspecified dementia without behavioral disturbance: Secondary | ICD-10-CM | POA: Diagnosis not present

## 2023-08-12 ENCOUNTER — Inpatient Hospital Stay: Payer: Medicare PPO

## 2023-08-12 DIAGNOSIS — R1312 Dysphagia, oropharyngeal phase: Secondary | ICD-10-CM | POA: Diagnosis not present

## 2023-08-12 DIAGNOSIS — F0390 Unspecified dementia without behavioral disturbance: Secondary | ICD-10-CM | POA: Diagnosis not present

## 2023-08-12 DIAGNOSIS — R488 Other symbolic dysfunctions: Secondary | ICD-10-CM | POA: Diagnosis not present

## 2023-08-13 ENCOUNTER — Inpatient Hospital Stay: Payer: Medicare PPO

## 2023-08-13 DIAGNOSIS — R488 Other symbolic dysfunctions: Secondary | ICD-10-CM | POA: Diagnosis not present

## 2023-08-13 DIAGNOSIS — R1312 Dysphagia, oropharyngeal phase: Secondary | ICD-10-CM | POA: Diagnosis not present

## 2023-08-13 DIAGNOSIS — F0390 Unspecified dementia without behavioral disturbance: Secondary | ICD-10-CM | POA: Diagnosis not present

## 2023-08-14 DIAGNOSIS — R488 Other symbolic dysfunctions: Secondary | ICD-10-CM | POA: Diagnosis not present

## 2023-08-14 DIAGNOSIS — R1312 Dysphagia, oropharyngeal phase: Secondary | ICD-10-CM | POA: Diagnosis not present

## 2023-08-14 DIAGNOSIS — F0390 Unspecified dementia without behavioral disturbance: Secondary | ICD-10-CM | POA: Diagnosis not present

## 2023-08-17 DIAGNOSIS — R488 Other symbolic dysfunctions: Secondary | ICD-10-CM | POA: Diagnosis not present

## 2023-08-17 DIAGNOSIS — F0390 Unspecified dementia without behavioral disturbance: Secondary | ICD-10-CM | POA: Diagnosis not present

## 2023-08-17 DIAGNOSIS — R1312 Dysphagia, oropharyngeal phase: Secondary | ICD-10-CM | POA: Diagnosis not present

## 2023-08-18 ENCOUNTER — Other Ambulatory Visit: Payer: Self-pay | Admitting: Adult Health

## 2023-08-18 MED ORDER — MORPHINE SULFATE (CONCENTRATE) 20 MG/ML PO SOLN: 5.0000 mg | ORAL | 0 refills | Status: DC | PRN

## 2023-08-19 ENCOUNTER — Inpatient Hospital Stay: Payer: Medicare PPO

## 2023-08-19 ENCOUNTER — Other Ambulatory Visit: Payer: Self-pay | Admitting: Adult Health

## 2023-08-19 MED ORDER — MORPHINE SULFATE (CONCENTRATE) 20 MG/ML PO SOLN: 5.0000 mg | ORAL | 0 refills | Status: AC | PRN

## 2023-08-20 ENCOUNTER — Inpatient Hospital Stay: Payer: Medicare PPO

## 2023-09-02 ENCOUNTER — Inpatient Hospital Stay: Payer: Medicare PPO

## 2023-09-03 ENCOUNTER — Inpatient Hospital Stay: Payer: Medicare PPO

## 2023-09-09 ENCOUNTER — Inpatient Hospital Stay: Payer: Medicare PPO

## 2023-09-10 ENCOUNTER — Inpatient Hospital Stay: Payer: Medicare PPO

## 2023-09-15 DEATH — deceased

## 2023-09-16 ENCOUNTER — Inpatient Hospital Stay: Payer: Medicare PPO

## 2023-09-17 ENCOUNTER — Inpatient Hospital Stay: Payer: Medicare PPO

## 2023-09-30 ENCOUNTER — Inpatient Hospital Stay: Payer: Medicare PPO

## 2023-10-01 ENCOUNTER — Inpatient Hospital Stay: Payer: Medicare PPO

## 2023-10-01 ENCOUNTER — Inpatient Hospital Stay: Payer: Medicare PPO | Admitting: Hematology

## 2023-10-07 ENCOUNTER — Inpatient Hospital Stay: Payer: Medicare PPO

## 2023-10-08 ENCOUNTER — Inpatient Hospital Stay: Payer: Medicare PPO

## 2023-10-14 ENCOUNTER — Inpatient Hospital Stay: Payer: Medicare PPO

## 2023-10-15 ENCOUNTER — Inpatient Hospital Stay: Payer: Medicare PPO

## 2024-10-19 ENCOUNTER — Encounter (INDEPENDENT_AMBULATORY_CARE_PROVIDER_SITE_OTHER): Payer: Self-pay | Admitting: Gastroenterology
